# Patient Record
Sex: Male | Born: 1940 | Race: White | Hispanic: No | Marital: Married | Smoking: Former smoker
Health system: Southern US, Community
[De-identification: ages and names within clinical notes are randomized; demographics above are authoritative.]

## PROBLEM LIST (undated history)

## (undated) DIAGNOSIS — F17201 Nicotine dependence, unspecified, in remission: Secondary | ICD-10-CM

## (undated) DIAGNOSIS — I739 Peripheral vascular disease, unspecified: Secondary | ICD-10-CM

## (undated) DIAGNOSIS — I480 Paroxysmal atrial fibrillation: Secondary | ICD-10-CM

## (undated) DIAGNOSIS — D126 Benign neoplasm of colon, unspecified: Secondary | ICD-10-CM

## (undated) DIAGNOSIS — I209 Angina pectoris, unspecified: Secondary | ICD-10-CM

## (undated) DIAGNOSIS — E1121 Type 2 diabetes mellitus with diabetic nephropathy: Secondary | ICD-10-CM

## (undated) DIAGNOSIS — K2940 Chronic atrophic gastritis without bleeding: Secondary | ICD-10-CM

## (undated) DIAGNOSIS — H353190 Nonexudative age-related macular degeneration, unspecified eye, stage unspecified: Secondary | ICD-10-CM

## (undated) DIAGNOSIS — J929 Pleural plaque without asbestos: Secondary | ICD-10-CM

## (undated) DIAGNOSIS — K635 Polyp of colon: Secondary | ICD-10-CM

## (undated) DIAGNOSIS — H9193 Unspecified hearing loss, bilateral: Secondary | ICD-10-CM

## (undated) DIAGNOSIS — I10 Essential (primary) hypertension: Secondary | ICD-10-CM

## (undated) DIAGNOSIS — G3184 Mild cognitive impairment, so stated: Secondary | ICD-10-CM

## (undated) DIAGNOSIS — I2510 Atherosclerotic heart disease of native coronary artery without angina pectoris: Secondary | ICD-10-CM

## (undated) DIAGNOSIS — I313 Pericardial effusion (noninflammatory): Secondary | ICD-10-CM

## (undated) DIAGNOSIS — D509 Iron deficiency anemia, unspecified: Secondary | ICD-10-CM

## (undated) DIAGNOSIS — M1990 Unspecified osteoarthritis, unspecified site: Secondary | ICD-10-CM

## (undated) DIAGNOSIS — J189 Pneumonia, unspecified organism: Secondary | ICD-10-CM

## (undated) DIAGNOSIS — E782 Mixed hyperlipidemia: Secondary | ICD-10-CM

## (undated) DIAGNOSIS — I5042 Chronic combined systolic (congestive) and diastolic (congestive) heart failure: Secondary | ICD-10-CM

## (undated) DIAGNOSIS — N529 Male erectile dysfunction, unspecified: Secondary | ICD-10-CM

## (undated) DIAGNOSIS — E669 Obesity, unspecified: Secondary | ICD-10-CM

## (undated) DIAGNOSIS — I314 Cardiac tamponade: Secondary | ICD-10-CM

## (undated) DIAGNOSIS — F341 Dysthymic disorder: Secondary | ICD-10-CM

## (undated) DIAGNOSIS — J449 Chronic obstructive pulmonary disease, unspecified: Secondary | ICD-10-CM

## (undated) DIAGNOSIS — N183 Chronic kidney disease, stage 3 (moderate): Secondary | ICD-10-CM

## (undated) DIAGNOSIS — E119 Type 2 diabetes mellitus without complications: Secondary | ICD-10-CM

## (undated) DIAGNOSIS — K589 Irritable bowel syndrome without diarrhea: Secondary | ICD-10-CM

## (undated) DIAGNOSIS — I255 Ischemic cardiomyopathy: Secondary | ICD-10-CM

## (undated) HISTORY — DX: Benign neoplasm of colon, unspecified: D12.6

## (undated) HISTORY — DX: Chronic combined systolic (congestive) and diastolic (congestive) heart failure: I50.42

## (undated) HISTORY — DX: Pleural plaque without asbestos: J92.9

## (undated) HISTORY — DX: Nonexudative age-related macular degeneration, unspecified eye, stage unspecified: H35.3190

## (undated) HISTORY — DX: Irritable bowel syndrome, unspecified: K58.9

## (undated) HISTORY — DX: Ischemic cardiomyopathy: I25.5

## (undated) HISTORY — PX: TESTICLE SURGERY: SHX794

## (undated) HISTORY — PX: PATELLA FRACTURE SURGERY: SHX735

## (undated) HISTORY — DX: Type 2 diabetes mellitus without complications: E11.9

## (undated) HISTORY — DX: Nicotine dependence, unspecified, in remission: F17.201

## (undated) HISTORY — DX: Dysthymic disorder: F34.1

## (undated) HISTORY — DX: Mixed hyperlipidemia: E78.2

## (undated) HISTORY — DX: Paroxysmal atrial fibrillation: I48.0

## (undated) HISTORY — PX: CATARACT EXTRACTION W/ INTRAOCULAR LENS  IMPLANT, BILATERAL: SHX1307

## (undated) HISTORY — DX: Male erectile dysfunction, unspecified: N52.9

## (undated) HISTORY — DX: Polyp of colon: K63.5

## (undated) HISTORY — DX: Mild cognitive impairment of uncertain or unknown etiology: G31.84

## (undated) HISTORY — DX: Type 2 diabetes mellitus with diabetic nephropathy: E11.21

## (undated) HISTORY — DX: Iron deficiency anemia, unspecified: D50.9

## (undated) HISTORY — DX: Chronic obstructive pulmonary disease, unspecified: J44.9

## (undated) HISTORY — DX: Essential (primary) hypertension: I10

## (undated) HISTORY — PX: TRANSTHORACIC ECHOCARDIOGRAM: SHX275

## (undated) HISTORY — DX: Obesity, unspecified: E66.9

## (undated) HISTORY — DX: Chronic kidney disease, stage 3 (moderate): N18.3

## (undated) HISTORY — DX: Chronic atrophic gastritis without bleeding: K29.40

## (undated) HISTORY — DX: Peripheral vascular disease, unspecified: I73.9

## (undated) HISTORY — DX: Unspecified hearing loss, bilateral: H91.93

---
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
## 1997-01-05 HISTORY — PX: CHOLECYSTECTOMY OPEN: SUR202

## 1999-01-06 DIAGNOSIS — K635 Polyp of colon: Secondary | ICD-10-CM

## 1999-01-06 HISTORY — DX: Polyp of colon: K63.5

## 2002-02-17 ENCOUNTER — Encounter: Admission: RE | Admit: 2002-02-17 | Discharge: 2002-05-18 | Payer: Self-pay | Admitting: Unknown Physician Specialty

## 2002-07-27 ENCOUNTER — Encounter: Admission: RE | Admit: 2002-07-27 | Discharge: 2002-10-25 | Payer: Self-pay | Admitting: Unknown Physician Specialty

## 2006-04-08 ENCOUNTER — Ambulatory Visit (HOSPITAL_COMMUNITY): Admission: RE | Admit: 2006-04-08 | Discharge: 2006-04-08 | Payer: Self-pay | Admitting: Ophthalmology

## 2006-04-22 ENCOUNTER — Ambulatory Visit (HOSPITAL_COMMUNITY): Admission: RE | Admit: 2006-04-22 | Discharge: 2006-04-22 | Payer: Self-pay | Admitting: Ophthalmology

## 2007-03-14 ENCOUNTER — Emergency Department (HOSPITAL_COMMUNITY): Admission: EM | Admit: 2007-03-14 | Discharge: 2007-03-15 | Payer: Self-pay | Admitting: Emergency Medicine

## 2011-09-01 ENCOUNTER — Ambulatory Visit (INDEPENDENT_AMBULATORY_CARE_PROVIDER_SITE_OTHER): Payer: PRIVATE HEALTH INSURANCE | Admitting: Family Medicine

## 2011-09-01 ENCOUNTER — Encounter: Payer: Self-pay | Admitting: Family Medicine

## 2011-09-01 VITALS — BP 154/73 | HR 96 | Temp 97.0°F | Ht 71.0 in | Wt 202.0 lb

## 2011-09-01 DIAGNOSIS — IMO0001 Reserved for inherently not codable concepts without codable children: Secondary | ICD-10-CM

## 2011-09-01 DIAGNOSIS — I10 Essential (primary) hypertension: Secondary | ICD-10-CM

## 2011-09-01 DIAGNOSIS — Z0000 Encounter for general adult medical examination without abnormal findings: Secondary | ICD-10-CM

## 2011-09-01 DIAGNOSIS — R5383 Other fatigue: Secondary | ICD-10-CM

## 2011-09-01 DIAGNOSIS — D509 Iron deficiency anemia, unspecified: Secondary | ICD-10-CM

## 2011-09-01 DIAGNOSIS — Z125 Encounter for screening for malignant neoplasm of prostate: Secondary | ICD-10-CM

## 2011-09-01 DIAGNOSIS — R5381 Other malaise: Secondary | ICD-10-CM

## 2011-09-01 LAB — COMPREHENSIVE METABOLIC PANEL
ALT: 14 U/L (ref 0–53)
CO2: 25 mEq/L (ref 19–32)
Chloride: 102 mEq/L (ref 96–112)
GFR: 125.97 mL/min (ref >60.00)
Potassium: 4 mEq/L (ref 3.5–5.1)
Sodium: 136 mEq/L (ref 135–145)
Total Bilirubin: 0.7 mg/dL (ref 0.3–1.2)
Total Protein: 6.9 g/dL (ref 6.0–8.3)

## 2011-09-01 LAB — CBC WITH DIFFERENTIAL/PLATELET
Eosinophils Relative: 1 % (ref 0.0–5.0)
Lymphocytes Relative: 22.4 % (ref 12.0–46.0)
Monocytes Absolute: 0.8 10*3/uL (ref 0.1–1.0)
Monocytes Relative: 8.3 % (ref 3.0–12.0)
Neutrophils Relative %: 67.7 % (ref 43.0–77.0)
Platelets: 196 10*3/uL (ref 150.0–400.0)
WBC: 9.2 10*3/uL (ref 4.5–10.5)

## 2011-09-01 LAB — HEMOGLOBIN A1C: Hgb A1c MFr Bld: 8.3 % — ABNORMAL HIGH (ref 4.6–6.5)

## 2011-09-01 NOTE — Progress Notes (Signed)
Office Note 09/02/2011  CC:  Chief Complaint  Patient presents with  . Establish Care    transfer from Dr. Olen [name]    HPI:  Joshua [name] is a 71 y.o. White male who is here to establish care. Patient's most recent primary MD: Dr. Olen [name] at [facility] in [place].  Also saw Dr. Buddy Duty at [place] a couple of times but has been "released' from his care. Old records were reviewed prior to or during today's visit.  DM 2: Last HbA1c in records was 03/2011 and was 8.6%.  His urine microalb/cr was elevated at that time.  CMET and Lipids were fine at that time.  He checks his glucose fasting each morning and he estimates his avg at this time is 140s. Chromium and welchol were added 03/2011 but pt inadvertently has been taking inadequate dose of welchol (657m once daily)--after switching from powder to pills.  His complaint today is feeling fatigued/tired in general.  Usually wakes up feeling fine but gets tired in early afternoon when he gets home from work..  No focal weakness.  Says his sleep is ok, feels rested in AM, wife says he doesn't snore or have apneic periods.  He is due for a colonoscopy for screening purposes + f/u hx of hyperplastic polyp 2001--? Who did this colonoscopy.  Past Medical History  Diagnosis Date  . DM type 2 (diabetes mellitus, type 2)   . Hypertension   . Tobacco dependence     Prior failure to quit with wellbutrin  . CKD (chronic kidney disease), stage I   . Hyperlipidemia   . Obesity   . Hyperplastic colon polyp 2001  . COPD (chronic obstructive pulmonary disease)     Spirometry  2004 borderline obstruction  . Erectile dysfunction     Normal testosterone  . Diabetic nephropathy     Elevated urine microalb/cr 03/2011    Past Surgical History  Procedure Date  . Cholecystectomy open 1999  . Appendectomy 1957  . Hip surgery age 89   Right hip dislocation surgery/repair (no metal involved)  . Knee surgery 1979 or so    left; bolt + 3 screws to repair  tib plateau fx  . Testicle surgery as a child    Undescended testicle brought down into scrotum    History reviewed. No pertinent family history.  History   Social History  . Marital Status: Married    Spouse Name: Joshua [name]    Number of Children: 4  . Years of Education: N/A   Occupational History  . Not on file.   Social History Main Topics  . Smoking status: Current Everyday Smoker -- 1.0 packs/day for 60 years    Types: Cigarettes  . Smokeless tobacco: Current User  . Alcohol Use: No  . Drug Use: No  . Sexually Active: Not on file   Other Topics Concern  . Not on file   Social History Narrative   Married, 4 children.Formerly a cMedical illustratorLocal transportation bus driver.  Level of education: HS.  +tobacco--lifelong (still smoking as of 08/2011).  No alcohol or drugs.No exercise.  +excessive caffeine.    Outpatient Encounter Prescriptions as of 09/01/2011  Medication Sig Dispense Refill  . acetaminophen (TYLENOL) 500 MG tablet Take 1,000 mg by mouth 2 (two) times daily as needed.      . chlorthalidone (HYGROTON) 25 MG tablet Take 1 tablet by mouth daily.      . Chromium Picolinate 500 MCG TABS Take 1  tablet by mouth daily.      [name] glimepiride (AMARYL) 4 MG tablet Take 1 tablet by mouth 2 (two) times daily.      [name] lovastatin (MEVACOR) 40 MG tablet Take 1 tablet by mouth 2 (two) times daily.      . metFORMIN (GLUCOPHAGE) 1000 MG tablet Take 1 tablet AM and PM and 1/2 tablet at lunch      . Multiple Vitamin (MULTIVITAMIN) tablet Take 1 tablet by mouth daily.      . ONGLYZA 5 MG TABS tablet Take 1 tablet by mouth daily.      . pioglitazone (ACTOS) 45 MG tablet Take 1 tablet by mouth daily.      . ramipril (ALTACE) 10 MG capsule Take 1 tablet by mouth 2 (two) times daily.      Earnestine Mealing 625 MG tablet Take 1 tablet by mouth daily.        Allergies  Allergen Reactions  . Demerol (Meperidine)   . Morphine And Related   . Starlix (Nateglinide) Other (See  Comments)    gassy    ROS Review of Systems  Constitutional: Positive for fatigue. Negative for fever.  HENT: Negative for congestion and sore throat.   Eyes: Negative for visual disturbance.  Respiratory: Negative for cough.   Cardiovascular: Negative for chest pain.  Gastrointestinal: Negative for nausea and abdominal pain.  Genitourinary: Negative for dysuria.  Musculoskeletal: Negative for back pain and joint swelling.  Skin: Negative for rash.  Neurological: Negative for headaches. Weakness: see HPI.  Hematological: Negative for adenopathy.    PE; Blood pressure 154/73, pulse 96, temperature 97 F (36.1 C), temperature source Temporal, height _0  (1.803 m), weight 202 lb (91.627 kg), SpO2 97.00%. Gen: Alert, well appearing.  Patient is oriented to person, place, time, and situation. ZOX:WRUE: no injection, icteris, swelling, or exudate.  EOMI, PERRLA. Nose: no drainage or turbinate edema/swelling.  No injection or focal lesion.  Mouth: lips without lesion/swelling.  Oral mucosa pink and moist.  Oropharynx without erythema, exudate, or swelling.  Neck - No masses or thyromegaly or limitation in range of motion CV: RRR, no m/r/g.   LUNGS: CTA bilat, nonlabored resps, good aeration in all lung fields. EXT: 1+ bilat LE edema from just below knees down into ankles.   FEET: no deformity, no calluses, no skin breakdown or erythema.  Great toenails on each foot are thickened moderately.  Other nails are normal.  Sensation intact to monofilament testing in all areas bilat.  Pertinent labs:  None today  ASSESSMENT AND PLAN:   New Patient: old records from PMD and endo reviewed today.  Fatigue Reassured.  Most likely related to mild deconditioning. However, will check TSH, CBC, and CMET. Recheck in 2 wks for CPE.  HTN (hypertension), benign BP a little up here.  Will hold off on any change in regimen for now.  Type II or unspecified type diabetes mellitus without mention of  complication, uncontrolled On maximal oral management, but actually needs to take full dose of welchol for a few months before saying oral med therapy not effective enough.  Discussed correct dosing of his 646m welchol tabs today: 3 tabs bid or 6 tabs qd.  He'll start this dosing now. Check HbA1c today.  He is UTD on urine microalb/cr and eye exam.  Foot exam normal today.   Prostate cancer screening PSA drawn today. Will do DRE at [facility] in 1-2 wks.    Return in about 2 weeks (around 09/15/2011)  for CPE (does not need to be fasting).

## 2011-09-02 ENCOUNTER — Encounter: Payer: Self-pay | Admitting: Family Medicine

## 2011-09-02 DIAGNOSIS — Z125 Encounter for screening for malignant neoplasm of prostate: Secondary | ICD-10-CM | POA: Insufficient documentation

## 2011-09-02 DIAGNOSIS — D62 Acute posthemorrhagic anemia: Secondary | ICD-10-CM | POA: Insufficient documentation

## 2011-09-02 DIAGNOSIS — R5383 Other fatigue: Secondary | ICD-10-CM | POA: Insufficient documentation

## 2011-09-02 DIAGNOSIS — I10 Essential (primary) hypertension: Secondary | ICD-10-CM | POA: Insufficient documentation

## 2011-09-02 LAB — IBC PANEL
Iron: 34 ug/dL — ABNORMAL LOW (ref 42–165)
Saturation Ratios: 6.6 % — ABNORMAL LOW (ref 20.0–50.0)
Transferrin: 368.4 mg/dL — ABNORMAL HIGH (ref 212.0–360.0)

## 2011-09-02 NOTE — Addendum Note (Signed)
Addended by: Darlina Sicilian R on: 09/02/2011 11:36 AM   Modules accepted: Orders

## 2011-09-02 NOTE — Assessment & Plan Note (Signed)
BP a little up here.  Will hold off on any change in regimen for now.

## 2011-09-02 NOTE — Assessment & Plan Note (Signed)
On maximal oral management, but actually needs to take full dose of welchol for a few months before saying oral med therapy not effective enough.  Discussed correct dosing of his 640m welchol tabs today: 3 tabs bid or 6 tabs qd.  He'll start this dosing now. Check HbA1c today.  He is UTD on urine microalb/cr and eye exam.  Foot exam normal today.

## 2011-09-02 NOTE — Assessment & Plan Note (Signed)
PSA drawn today. Will do DRE at C[facility] in 1-2 wks.

## 2011-09-02 NOTE — Assessment & Plan Note (Signed)
Reassured.  Most likely related to mild deconditioning. However, will check TSH, CBC, and CMET. Recheck in 2 wks for CPE.

## 2011-09-03 ENCOUNTER — Other Ambulatory Visit: Payer: Self-pay | Admitting: Family Medicine

## 2011-09-03 DIAGNOSIS — D509 Iron deficiency anemia, unspecified: Secondary | ICD-10-CM

## 2011-09-03 NOTE — Progress Notes (Signed)
Quick Note:  Patient Informed and voiced understanding. Per MD pt needs to pick up an over the counter Iron Sulfate 325 mg and take bid. Pt would also like to proceed with the GI referral. Please advise? ______

## 2011-09-03 NOTE — Progress Notes (Signed)
Referral to [facility] ordered today.--PM

## 2011-09-09 NOTE — Progress Notes (Signed)
Noted-PM

## 2011-09-10 ENCOUNTER — Other Ambulatory Visit: Payer: Self-pay | Admitting: Family Medicine

## 2011-09-10 MED ORDER — COLESEVELAM HCL 625 MG PO TABS: 1875.0000 mg | ORAL_TABLET | Freq: Two times a day (BID) | ORAL | Status: DC

## 2011-09-10 NOTE — Telephone Encounter (Signed)
Pt was seen 8/27.  Correct dosing discussed with patient by Dr. Anitra Lauth- 3 bid or 6 QD.  This is called to pharmacy.

## 2011-09-15 ENCOUNTER — Other Ambulatory Visit: Payer: PRIVATE HEALTH INSURANCE

## 2011-09-15 ENCOUNTER — Encounter: Payer: Self-pay | Admitting: Gastroenterology

## 2011-09-15 ENCOUNTER — Other Ambulatory Visit: Payer: Self-pay | Admitting: *Deleted

## 2011-09-15 LAB — HEMOCCULT SLIDES (X 3 CARDS)

## 2011-09-15 MED ORDER — PIOGLITAZONE HCL 45 MG PO TABS: 45.0000 mg | ORAL_TABLET | Freq: Every day | ORAL | Status: DC

## 2011-09-15 MED ORDER — LOVASTATIN 40 MG PO TABS: 40.0000 mg | ORAL_TABLET | Freq: Two times a day (BID) | ORAL | Status: DC

## 2011-09-15 MED ORDER — METFORMIN HCL 1000 MG PO TABS: ORAL_TABLET | ORAL | Status: DC

## 2011-09-15 MED ORDER — GLIMEPIRIDE 4 MG PO TABS: 4.0000 mg | ORAL_TABLET | Freq: Two times a day (BID) | ORAL | Status: DC

## 2011-09-15 MED ORDER — CHLORTHALIDONE 25 MG PO TABS: 25.0000 mg | ORAL_TABLET | Freq: Every day | ORAL | Status: DC

## 2011-09-15 MED ORDER — RAMIPRIL 10 MG PO CAPS: 10.0000 mg | ORAL_CAPSULE | Freq: Two times a day (BID) | ORAL | Status: DC

## 2011-09-15 NOTE — Telephone Encounter (Signed)
Faxed refill request received from pharmacy for METFORMIN, GLIMEPERIDE, LOVASTATIN, RAMIPRIL, PIOGLITAZONE, CHLORTHALIDONE,  Last filled by MD on--never by this MD Last seen on 09/01/11 Follow up on 09/16/11 for [facility] RX's sent.

## 2011-09-16 ENCOUNTER — Ambulatory Visit (INDEPENDENT_AMBULATORY_CARE_PROVIDER_SITE_OTHER): Payer: PRIVATE HEALTH INSURANCE | Admitting: Family Medicine

## 2011-09-16 ENCOUNTER — Encounter: Payer: Self-pay | Admitting: Family Medicine

## 2011-09-16 VITALS — BP 123/66 | HR 96 | Ht 71.0 in | Wt 204.0 lb

## 2011-09-16 DIAGNOSIS — Z23 Encounter for immunization: Secondary | ICD-10-CM

## 2011-09-16 DIAGNOSIS — Z0000 Encounter for general adult medical examination without abnormal findings: Secondary | ICD-10-CM

## 2011-09-16 DIAGNOSIS — D509 Iron deficiency anemia, unspecified: Secondary | ICD-10-CM

## 2011-09-16 MED ORDER — SAXAGLIPTIN HCL 5 MG PO TABS: 5.0000 mg | ORAL_TABLET | Freq: Every day | ORAL | Status: DC

## 2011-09-16 NOTE — Patient Instructions (Signed)
Health Maintenance, Males A healthy lifestyle and preventative care can promote health and wellness.  Maintain regular health, dental, and eye exams.   Eat a healthy diet. Foods like vegetables, fruits, whole grains, low-fat dairy products, and lean protein foods contain the nutrients you need without too many calories. Decrease your intake of foods high in solid fats, added sugars, and salt. Get information about a proper diet from your caregiver, if necessary.   Regular physical exercise is one of the most important things you can do for your health. Most adults should get at least 150 minutes of moderate-intensity exercise (any activity that increases your heart rate and causes you to sweat) each week. In addition, most adults need muscle-strengthening exercises on 2 or more days a week.    Maintain a healthy weight. The body mass index (BMI) is a screening tool to identify possible weight problems. It provides an estimate of body fat based on height and weight. Your caregiver can help determine your BMI, and can help you achieve or maintain a healthy weight. For adults 20 years and older:   A BMI below 18.5 is considered underweight.   A BMI of 18.5 to 24.9 is normal.   A BMI of 25 to 29.9 is considered overweight.   A BMI of 30 and above is considered obese.   Maintain normal blood lipids and cholesterol by exercising and minimizing your intake of saturated fat. Eat a balanced diet with plenty of fruits and vegetables. Blood tests for lipids and cholesterol should begin at age 20 and be repeated every 5 years. If your lipid or cholesterol levels are high, you are over 50, or you are a high risk for heart disease, you may need your cholesterol levels checked more frequently.Ongoing high lipid and cholesterol levels should be treated with medicines, if diet and exercise are not effective.   If you smoke, find out from your caregiver how to quit. If you do not use tobacco, do not start.    If you choose to drink alcohol, do not exceed 2 drinks per day. One drink is considered to be 12 ounces (355 mL) of beer, 5 ounces (148 mL) of wine, or 1.5 ounces (44 mL) of liquor.   Avoid use of street drugs. Do not share needles with anyone. Ask for help if you need support or instructions about stopping the use of drugs.   High blood pressure causes heart disease and increases the risk of stroke. Blood pressure should be checked at least every 1 to 2 years. Ongoing high blood pressure should be treated with medicines if weight loss and exercise are not effective.   If you are 45 to 72 years old, ask your caregiver if you should take aspirin to prevent heart disease.   Diabetes screening involves taking a blood sample to check your fasting blood sugar level. This should be done once every 3 years, after age 45, if you are within normal weight and without risk factors for diabetes. Testing should be considered at a younger age or be carried out more frequently if you are overweight and have at least 1 risk factor for diabetes.   Colorectal cancer can be detected and often prevented. Most routine colorectal cancer screening begins at the age of 50 and continues through age 75. However, your caregiver may recommend screening at an earlier age if you have risk factors for colon cancer. On a yearly basis, your caregiver may provide home test kits to check for hidden   blood in the stool. Use of a small camera at the end of a tube, to directly examine the colon (sigmoidoscopy or colonoscopy), can detect the earliest forms of colorectal cancer. Talk to your caregiver about this at age 90, when routine screening begins. Direct examination of the colon should be repeated every 5 to 10 years through age 2, unless early forms of pre-cancerous polyps or small growths are found.   Hepatitis C blood testing is recommended for all people born from [birth year] through 1966 and any individual with known risks for  hepatitis C.   Healthy men should no longer receive prostate-specific antigen (PSA) blood tests as part of routine cancer screening. Consult with your caregiver about prostate cancer screening.   Testicular cancer screening is not recommended for adolescents or adult males who have no symptoms. Screening includes self-exam, caregiver exam, and other screening tests. Consult with your caregiver about any symptoms you have or any concerns you have about testicular cancer.   Practice safe sex. Use condoms and avoid high-risk sexual practices to reduce the spread of sexually transmitted infections (STIs).   Use sunscreen with a sun protection factor (SPF) of 30 or greater. Apply sunscreen liberally and repeatedly throughout the day. You should seek shade when your shadow is shorter than you. Protect yourself by wearing long sleeves, pants, a wide-brimmed hat, and sunglasses year round, whenever you are outdoors.   Notify your caregiver of new moles or changes in moles, especially if there is a change in shape or color. Also notify your caregiver if a mole is larger than the size of a pencil eraser.   A one-time screening for abdominal aortic aneurysm (AAA) and surgical repair of large AAAs by sound wave imaging (ultrasonography) is recommended for ages 15 to 25 years who are current or former smokers.   Stay current with your immunizations.  Document Released: 06/20/2007 Document Revised: 12/11/2010 Document Reviewed: 05/19/2010 [facility] Patient Information 2012 [place].

## 2011-09-16 NOTE — Progress Notes (Signed)
Office Note 09/16/2011  CC:  Chief Complaint  Patient presents with  . Annual Exam    no problems    HPI:  Joshua [name] is a 71 y.o. White male who is here for CPE. I saw him 2 wks ago and he was complaining of generalized fatigue and his labs turned up microcytic anemia, f/u iron testing confirmed iron deficiency.  He was told to start FeSO4 368m bid and was given hemoccult cards x 3 but results returned yesterday showing that the sample was done incorrectly and could not be processed.. Tolerating iron well, +compliant. Glucoses avg around 100 since last visit--taking full dose of welchol.   He has GI initial consult 10/08/11.  Past Medical History  Diagnosis Date  . DM type 2 (diabetes mellitus, type 2)   . Hypertension   . Tobacco dependence     Prior failure to quit with wellbutrin  . CKD (chronic kidney disease), stage I   . Hyperlipidemia   . Obesity   . Hyperplastic colon polyp 2001  . COPD (chronic obstructive pulmonary disease)     Spirometry  2004 borderline obstruction  . Erectile dysfunction     Normal testosterone  . Diabetic nephropathy     Elevated urine microalb/cr 03/2011    Past Surgical History  Procedure Date  . Cholecystectomy open 1999  . Appendectomy 1957  . Hip surgery age 71   Right hip dislocation surgery/repair (no metal involved)  . Knee surgery 1979 or so    left; bolt + 3 screws to repair tib plateau fx  . Testicle surgery as a child    Undescended testicle brought down into scrotum    No family history on file.  History   Social History  . Marital Status: Married    Spouse Name: [name]    Number of Children: 4  . Years of Education: N/A   Occupational History  . Not on file.   Social History Main Topics  . Smoking status: Current Every Day Smoker -- 1.0 packs/day for 60 years    Types: Cigarettes  . Smokeless tobacco: Current User  . Alcohol Use: No  . Drug Use: No  . Sexually Active: Not on file   Other Topics  Concern  . Not on file   Social History Narrative   Married, 4 children.Formerly a cMedical illustratorLocal transportation bus driver.  Level of education: HS.  +tobacco--lifelong (still smoking as of 08/2011).  No alcohol or drugs.No exercise.  +excessive caffeine.    Outpatient Prescriptions Prior to Visit  Medication Sig Dispense Refill  . acetaminophen (TYLENOL) 500 MG tablet Take 1,000 mg by mouth 2 (two) times daily as needed.      . chlorthalidone (HYGROTON) 25 MG tablet Take 1 tablet (25 mg total) by mouth daily.  30 tablet  5  . Chromium Picolinate 500 MCG TABS Take 1 tablet by mouth daily.      . colesevelam (WELCHOL) 625 MG tablet Take 3 tablets (1,875 mg total) by mouth 2 (two) times daily with a meal.  180 tablet  5  . glimepiride (AMARYL) 4 MG tablet Take 1 tablet (4 mg total) by mouth 2 (two) times daily.  60 tablet  5  . lovastatin (MEVACOR) 40 MG tablet Take 1 tablet (40 mg total) by mouth 2 (two) times daily.  60 tablet  5  . metFORMIN (GLUCOPHAGE) 1000 MG tablet Take 1 tablet AM and PM and 1/2 tablet at lunch  75 tablet  5  . Multiple Vitamin (MULTIVITAMIN) tablet Take 1 tablet by mouth daily.      . pioglitazone (ACTOS) 45 MG tablet Take 1 tablet (45 mg total) by mouth daily.  30 tablet  5  . ramipril (ALTACE) 10 MG capsule Take 1 capsule (10 mg total) by mouth 2 (two) times daily.  60 capsule  5  . ONGLYZA 5 MG TABS tablet Take 1 tablet by mouth daily.        Allergies  Allergen Reactions  . Demerol (Meperidine)   . Morphine And Related   . Starlix (Nateglinide) Other (See Comments)    gassy    ROS Review of Systems  Constitutional: Positive for fatigue. Negative for fever, chills and appetite change.  HENT: Negative for ear pain, congestion, sore throat, neck stiffness and dental problem.   Eyes: Negative for discharge, redness and visual disturbance.  Respiratory: Negative for cough, chest tightness, shortness of breath and wheezing.     Cardiovascular: Negative for chest pain, palpitations and leg swelling.  Gastrointestinal: Negative for nausea, vomiting, abdominal pain, diarrhea and blood in stool.  Genitourinary: Negative for dysuria, urgency, frequency, hematuria, flank pain and difficulty urinating.  Musculoskeletal: Negative for myalgias, back pain, joint swelling and arthralgias.  Skin: Negative for pallor and rash.  Neurological: Negative for dizziness, speech difficulty, weakness and headaches.  Hematological: Negative for adenopathy. Does not bruise/bleed easily.  Psychiatric/Behavioral: Negative for confusion and disturbed wake/sleep cycle. The patient is not nervous/anxious.     PE; Blood pressure 123/66, pulse 96, height _0  (1.803 m), weight 204 lb (92.534 kg). Gen: Alert, well appearing.  Patient is oriented to person, place, time, and situation. ENT: Ears: EACs clear, normal epithelium.  TMs with good light reflex and landmarks bilaterally.  Eyes: no injection, icteris, swelling, or exudate.  EOMI, PERRLA. Nose: no drainage or turbinate edema/swelling.  No injection or focal lesion.  Mouth: lips without lesion/swelling.  Oral mucosa pink and moist.  Dentition intact and without obvious caries or gingival swelling.  Oropharynx without erythema, exudate, or swelling.  Neck: supple/nontender.  No LAD, mass, or TM.  Carotid pulses 2+ bilaterally, without bruits. CV: RRR, no m/r/g.   LUNGS: CTA bilat, nonlabored resps, good aeration in all lung fields. ABD: soft, NT, ND, BS normal.  No hepatospenomegaly or mass.  No bruits. EXT: no clubbing, cyanosis, or edema.  Skin - no sores or suspicious lesions or rashes or color changes Genitals normal; both testes normal to palpation except left testicle is notably atrophied; without tenderness, masses, hydroceles, varicoceles, erythema or swelling. Shaft normal, circumcised, meatus normal without discharge. No inguinal hernia noted. No inguinal lymphadenopathy. Rectal  exam: negative without mass, lesions or tenderness, PROSTATE EXAM: smooth and symmetric without nodules or tenderness.  Size wnl.   Pertinent labs:  Hemoccult of stool wipings from glove today was negative  No results found for this basename: TSH   Lab Results  Component Value Date   WBC 9.2 09/01/2011   HGB 10.0* 09/01/2011   HCT 33.3* 09/01/2011   MCV 65.3 Repeated and verified X2.* 09/01/2011   PLT 196.0 09/01/2011   Lab Results  Component Value Date   CREATININE 0.7 09/01/2011   BUN 21 09/01/2011   NA 136 09/01/2011   K 4.0 09/01/2011   CL 102 09/01/2011   CO2 25 09/01/2011   Lab Results  Component Value Date   ALT 14 09/01/2011   AST 17 09/01/2011   ALKPHOS 53 09/01/2011   BILITOT 0.7 09/01/2011  No results found for this basename: CHOL   No results found for this basename: HDL   No results found for this basename: LDLCALC   No results found for this basename: TRIG   No results found for this basename: CHOLHDL   Lab Results  Component Value Date   PSA 1.33 09/01/2011       ASSESSMENT AND PLAN:   Health maintenance examination Reviewed age and gender appropriate health maintenance issues (prudent diet, regular exercise, health risks of tobacco and excessive alcohol, use of seatbelts, fire alarms in home, use of sunscreen).  Also reviewed age and gender appropriate health screening as well as vaccine recommendations. Encouraged smoking cessation but he is not contemplating quitting at this time. Flu vaccine IM today.  Iron deficiency anemia Needs to repeat hemoccults, continue iron bid, keep GI appointment for 10/08/11.    FOLLOW UP:  Return in about 3 months (around 12/16/2011) for f/u anemia, DM 2, HTN.

## 2011-09-16 NOTE — Assessment & Plan Note (Signed)
Reviewed age and gender appropriate health maintenance issues (prudent diet, regular exercise, health risks of tobacco and excessive alcohol, use of seatbelts, fire alarms in home, use of sunscreen).  Also reviewed age and gender appropriate health screening as well as vaccine recommendations. Encouraged smoking cessation but he is not contemplating quitting at this time. Flu vaccine IM today.

## 2011-09-16 NOTE — Assessment & Plan Note (Signed)
Needs to repeat hemoccults, continue iron bid, keep GI appointment for 10/08/11.

## 2011-10-01 ENCOUNTER — Encounter: Payer: Self-pay | Admitting: Family Medicine

## 2011-10-08 ENCOUNTER — Ambulatory Visit (INDEPENDENT_AMBULATORY_CARE_PROVIDER_SITE_OTHER): Payer: PRIVATE HEALTH INSURANCE | Admitting: Gastroenterology

## 2011-10-08 ENCOUNTER — Encounter: Payer: Self-pay | Admitting: Gastroenterology

## 2011-10-08 VITALS — BP 128/40 | HR 92 | Ht 70.0 in | Wt 201.0 lb

## 2011-10-08 DIAGNOSIS — Z860100 Personal history of colon polyps, unspecified: Secondary | ICD-10-CM | POA: Insufficient documentation

## 2011-10-08 DIAGNOSIS — D509 Iron deficiency anemia, unspecified: Secondary | ICD-10-CM

## 2011-10-08 NOTE — Progress Notes (Signed)
History of Present Illness: Pleasant 71 year old white male with history of colon polyps, hypertension, diabetes and COPD referred at the request of Dr. Anitra Lauth for evaluation of anemia.  This was noted on routine testing,  Hemoglobin was 10 and MCV 64. The patient's main complaint is easy fatigability. He's had no overt GI bleeding including melena or hematochezia. He denies abdominal pain pyrosis or dysphagia. He apparantly has a history of colon polyps and last colonoscoped over 10 years ago.  He tends to be constipated.    Past Medical History  Diagnosis Date  . DM type 2 (diabetes mellitus, type 2)   . Hypertension   . Tobacco dependence     Prior failure to quit with wellbutrin  . CKD (chronic kidney disease), stage I   . Hyperlipidemia   . Obesity   . Hyperplastic colon polyp 2001  . COPD (chronic obstructive pulmonary disease)     Spirometry  2004 borderline obstruction  . Erectile dysfunction     Normal testosterone  . Diabetic nephropathy     Elevated urine microalb/cr 03/2011   Past Surgical History  Procedure Date  . Cholecystectomy open 1999  . Appendectomy 1957  . Hip surgery age 37    Right hip dislocation surgery/repair (no metal involved)  . Knee surgery 1979 or so    left; bolt + 3 screws to repair tib plateau fx  . Testicle surgery as a child    Undescended testicle brought down into scrotum  . Tonsillectomy    family history includes Breast cancer in his sister; Diabetes in his maternal uncle and paternal grandmother; and Heart disease in his father and maternal uncle. Current Outpatient Prescriptions  Medication Sig Dispense Refill  . acetaminophen (TYLENOL) 500 MG tablet Take 1,000 mg by mouth 2 (two) times daily as needed.      [name] aspirin 81 MG tablet Take 81 mg by mouth daily.      . chlorthalidone (HYGROTON) 25 MG tablet Take 1 tablet (25 mg total) by mouth daily.  30 tablet  5  . Chromium Picolinate 500 MCG TABS Take 1 tablet by mouth daily.      .  colesevelam (WELCHOL) 625 MG tablet Take 3 tablets (1,875 mg total) by mouth 2 (two) times daily with a meal.  180 tablet  5  . ferrous sulfate 325 (65 FE) MG tablet Take 325 mg by mouth 2 (two) times daily.      [name] glimepiride (AMARYL) 4 MG tablet Take 1 tablet (4 mg total) by mouth 2 (two) times daily.  60 tablet  5  . lovastatin (MEVACOR) 40 MG tablet Take 1 tablet (40 mg total) by mouth 2 (two) times daily.  60 tablet  5  . metFORMIN (GLUCOPHAGE) 1000 MG tablet Take 1 tablet AM and PM and 1/2 tablet at lunch  75 tablet  5  . Multiple Vitamin (MULTIVITAMIN) tablet Take 1 tablet by mouth daily.      . pioglitazone (ACTOS) 45 MG tablet Take 1 tablet (45 mg total) by mouth daily.  30 tablet  5  . ramipril (ALTACE) 10 MG capsule Take 1 capsule (10 mg total) by mouth 2 (two) times daily.  60 capsule  5  . saxagliptin HCl (ONGLYZA) 5 MG TABS tablet Take 1 tablet (5 mg total) by mouth daily.  30 tablet  5   Allergies as of 10/08/2011 - Review Complete 10/08/2011  Allergen Reaction Noted  . Demerol (meperidine)  09/01/2011  . Morphine and related  09/01/2011  . Starlix (nateglinide) Other (See Comments) 09/01/2011    reports that he has been smoking Cigarettes.  He has a 60 pack-year smoking history. His smokeless tobacco use includes Chew. He reports that he does not drink alcohol or use illicit drugs.     Review of Systems: Pertinent positive and negative review of systems were noted in the above HPI section. All other review of systems were otherwise negative.  Vital signs were reviewed in today's medical record Physical Exam: General: Well developed , well nourished, no acute distress Head: Normocephalic and atraumatic Eyes:  sclerae anicteric, EOMI Ears: Normal auditory acuity Mouth: No deformity or lesions Neck: Supple, no masses or thyromegaly Lungs: Clear throughout to auscultation Heart: Regular rate and rhythm; no murmurs, rubs or bruits Abdomen: Soft, non tender and non  distended. No masses, hepatosplenomegaly or hernias noted. Normal Bowel sounds Rectal:deferred Musculoskeletal: Symmetrical with no gross deformities  Skin: No lesions on visible extremities Pulses:  Normal pulses noted Extremities: No clubbing, cyanosis, edema or deformities noted Neurological: Alert oriented x 4, grossly nonfocal Cervical Nodes:  No significant cervical adenopathy Inguinal Nodes: No significant inguinal adenopathy Psychological:  Alert and cooperative. Normal mood and affect

## 2011-10-08 NOTE — Assessment & Plan Note (Signed)
Plan colonoscopy

## 2011-10-08 NOTE — Assessment & Plan Note (Signed)
Iron deficiency anemia presumably due to chronic GI blood loss. Plan colonoscopy in view of iron deficiency and history of colon polyps. Should this test be negative I will proceed with upper endoscopy. Patient also needs Hemoccults.

## 2011-10-08 NOTE — Patient Instructions (Addendum)
Colonoscopy A colonoscopy is an exam to evaluate your entire colon. In this exam, your colon is cleansed. A long fiberoptic tube is inserted through your rectum and into your colon. The fiberoptic scope (endoscope) is a long bundle of enclosed and very flexible fibers. These fibers transmit light to the area examined and send images from that area to your caregiver. Discomfort is usually minimal. You may be given a drug to help you sleep (sedative) during or prior to the procedure. This exam helps to detect lumps (tumors), polyps, inflammation, and areas of bleeding. Your caregiver may also take a small piece of tissue (biopsy) that will be examined under a microscope. LET YOUR CAREGIVER KNOW ABOUT:   Allergies to food or medicine.  Medicines taken, including vitamins, herbs, eyedrops, over-the-counter medicines, and creams.  Use of steroids (by mouth or creams).  Previous problems with anesthetics or numbing medicines.  History of bleeding problems or blood clots.  Previous surgery.  Other health problems, including diabetes and kidney problems.  Possibility of pregnancy, if this applies. BEFORE THE PROCEDURE   A clear liquid diet may be required for 2 days before the exam.  Ask your caregiver about changing or stopping your regular medications.  Liquid injections (enemas) or laxatives may be required.  A large amount of electrolyte solution may be given to you to drink over a short period of time. This solution is used to clean out your colon.  You should be present 60 minutes prior to your procedure or as directed by your caregiver. AFTER THE PROCEDURE   If you received a sedative or pain relieving medication, you will need to arrange for someone to drive you home.  Occasionally, there is a little blood passed with the first bowel movement. Do not be concerned. FINDING OUT THE RESULTS OF YOUR TEST Not all test results are available during your visit. If your test results are  not back during the visit, make an appointment with your caregiver to find out the results. Do not assume everything is normal if you have not heard from your caregiver or the medical facility. It is important for you to follow up on all of your test results. HOME CARE INSTRUCTIONS   It is not unusual to pass moderate amounts of gas and experience mild abdominal cramping following the procedure. This is due to air being used to inflate your colon during the exam. Walking or a warm pack on your belly (abdomen) may help.  You may resume all normal meals and activities after sedatives and medicines have worn off.  Only take over-the-counter or prescription medicines for pain, discomfort, or fever as directed by your caregiver. Do not use aspirin or blood thinners if a biopsy was taken. Consult your caregiver for medicine usage if biopsies were taken. SEEK IMMEDIATE MEDICAL CARE IF:   You have a fever.  You pass large blood clots or fill a toilet with blood following the procedure. This may also occur 10 to 14 days following the procedure. This is more likely if a biopsy was taken.  You develop abdominal pain that keeps getting worse and cannot be relieved with medicine. Document Released: 12/20/1999 Document Revised: 03/16/2011 Document Reviewed: 08/04/2007 [facility] Patient Information 2013 [place].

## 2011-10-16 ENCOUNTER — Ambulatory Visit (HOSPITAL_COMMUNITY)
Admission: RE | Admit: 2011-10-16 | Discharge: 2011-10-16 | Disposition: A | Discharge status: Home / Self Care | Payer: PRIVATE HEALTH INSURANCE | Source: Ambulatory Visit | Attending: Gastroenterology | Admitting: Gastroenterology

## 2011-10-16 ENCOUNTER — Encounter (HOSPITAL_COMMUNITY): Payer: Self-pay | Admitting: *Deleted

## 2011-10-16 ENCOUNTER — Encounter (HOSPITAL_COMMUNITY)
Admission: RE | Disposition: A | Discharge status: Home / Self Care | Payer: Self-pay | Source: Ambulatory Visit | Attending: Gastroenterology

## 2011-10-16 DIAGNOSIS — J4489 Other specified chronic obstructive pulmonary disease: Secondary | ICD-10-CM | POA: Insufficient documentation

## 2011-10-16 DIAGNOSIS — E785 Hyperlipidemia, unspecified: Secondary | ICD-10-CM | POA: Insufficient documentation

## 2011-10-16 DIAGNOSIS — D509 Iron deficiency anemia, unspecified: Secondary | ICD-10-CM

## 2011-10-16 DIAGNOSIS — J449 Chronic obstructive pulmonary disease, unspecified: Secondary | ICD-10-CM | POA: Insufficient documentation

## 2011-10-16 DIAGNOSIS — D375 Neoplasm of uncertain behavior of rectum: Secondary | ICD-10-CM | POA: Insufficient documentation

## 2011-10-16 DIAGNOSIS — D126 Benign neoplasm of colon, unspecified: Secondary | ICD-10-CM

## 2011-10-16 DIAGNOSIS — I129 Hypertensive chronic kidney disease with stage 1 through stage 4 chronic kidney disease, or unspecified chronic kidney disease: Secondary | ICD-10-CM | POA: Insufficient documentation

## 2011-10-16 DIAGNOSIS — D371 Neoplasm of uncertain behavior of stomach: Secondary | ICD-10-CM | POA: Insufficient documentation

## 2011-10-16 DIAGNOSIS — N189 Chronic kidney disease, unspecified: Secondary | ICD-10-CM | POA: Insufficient documentation

## 2011-10-16 DIAGNOSIS — Z86010 Personal history of colonic polyps: Secondary | ICD-10-CM

## 2011-10-16 DIAGNOSIS — E119 Type 2 diabetes mellitus without complications: Secondary | ICD-10-CM | POA: Insufficient documentation

## 2011-10-16 DIAGNOSIS — Z79899 Other long term (current) drug therapy: Secondary | ICD-10-CM | POA: Insufficient documentation

## 2011-10-16 HISTORY — PX: COLONOSCOPY: SHX5424

## 2011-10-16 HISTORY — DX: Benign neoplasm of colon, unspecified: D12.6

## 2011-10-16 HISTORY — DX: Unspecified osteoarthritis, unspecified site: M19.90

## 2011-10-16 SURGERY — COLONOSCOPY
Anesthesia: Moderate Sedation

## 2011-10-16 MED ORDER — MIDAZOLAM HCL 5 MG/5ML IJ SOLN
INTRAMUSCULAR | Status: DC | PRN
  Administered 2011-10-16: 2 mg via INTRAVENOUS
  Administered 2011-10-16 (×2): 1 mg via INTRAVENOUS
  Administered 2011-10-16: 2 mg via INTRAVENOUS
  Administered 2011-10-16: 1 mg via INTRAVENOUS

## 2011-10-16 MED ORDER — FENTANYL CITRATE 0.05 MG/ML IJ SOLN
INTRAMUSCULAR | Status: DC | PRN
  Administered 2011-10-16 (×2): 12.5 ug via INTRAVENOUS
  Administered 2011-10-16 (×2): 25 ug via INTRAVENOUS

## 2011-10-16 MED ORDER — MIDAZOLAM HCL 10 MG/2ML IJ SOLN
INTRAMUSCULAR | Status: AC
  Filled 2011-10-16: qty 4

## 2011-10-16 MED ORDER — SODIUM CHLORIDE 0.9 % IV SOLN: INTRAVENOUS | Status: DC

## 2011-10-16 MED ORDER — FENTANYL CITRATE 0.05 MG/ML IJ SOLN
INTRAMUSCULAR | Status: AC
  Filled 2011-10-16: qty 4

## 2011-10-16 NOTE — Op Note (Signed)
[facility] [place], [address]   COLONOSCOPY PROCEDURE REPORT  PATIENT: Joshua [name], Joshua [name]  MR#: [id number] BIRTHDATE: 1940-06-29 , 71  yrs. old GENDER: Male ENDOSCOPIST: Inda Castle, MD REFERRED GM:[id number] Anitra Lauth, M.D. PROCEDURE DATE:  10/16/2011 PROCEDURE:   Colonoscopy with snare polypectomy ASA CLASS:   Class II INDICATIONS:iron deficiency anemia. MEDICATIONS: These medications were titrated to patient response per physician's verbal order, Versed 7 mg IV, and Fentanyl 75 mcg IV  DESCRIPTION OF PROCEDURE:   After the risks benefits and alternatives of the procedure were thoroughly explained, informed consent was obtained.  A digital rectal exam revealed no abnormalities of the rectum.   The Pentax Colonoscope [id number] endoscope was introduced through the anus and advanced to the cecum, which was identified by both the appendix and ileocecal valve. No adverse events experienced.   The quality of the prep was Suprep excellent  The instrument was then slowly withdrawn as the colon was fully examined.      COLON FINDINGS: A sessile polyp measuring 2-3 mm in size was found in the transverse colon.  A polypectomy was performed with a cold snare.  The resection was complete and the polyp tissue was completely retrieved.   A sessile polyp measuring 4 mm in size was found.  A polypectomy was performed with a cold snare.  The resection was complete and the polyp tissue was completely retrieved.  Retroflexed views revealed no abnormalities. The time to cecum=  .  Withdrawal time=10 minutes 0 seconds.  The scope was withdrawn and the procedure completed. COMPLICATIONS: There were no complications.  ENDOSCOPIC IMPRESSION: 1.   Sessile polyp measuring 2-3 mm in size was found in the transverse colon; polypectomy was performed with a cold snare 2.   Sessile polyp measuring 4 mm in size was found; polypectomy was performed with a cold  snare  RECOMMENDATIONS: Upper endoscopy will be scheduled   eSigned:  Inda Castle, MD 10/16/2011 9:43 AM   cc:

## 2011-10-16 NOTE — Interval H&P Note (Signed)
History and Physical Interval Note:  10/16/2011 8:54 AM  Joshua [name]  has presented today for surgery, with the diagnosis of anemia  The various methods of treatment have been discussed with the patient and family. After consideration of risks, benefits and other options for treatment, the patient has consented to  Procedure(s) (LRB) with comments: COLONOSCOPY (N/A) as a surgical intervention .  The patient's history has been reviewed, patient examined, no change in status, stable for surgery.  I have reviewed the patient's chart and labs.  Questions were answered to the patient's satisfaction.     The recent H&P (dated **10/08/11*) was reviewed, the patient was examined and there is no change in the patients condition since that H&P was completed.   Erskine Emery  10/16/2011, 8:55 AM   Erskine Emery

## 2011-10-16 NOTE — Progress Notes (Signed)
Patient reports no allergy to fentanyl discussed with Dr Deatra Ina ok to sedate with fentanyl.

## 2011-10-16 NOTE — Interval H&P Note (Signed)
History and Physical Interval Note:  10/16/2011 10:13 AM  Joshua [name]  has presented today for surgery, with the diagnosis of anemia  The various methods of treatment have been discussed with the patient and family. After consideration of risks, benefits and other options for treatment, the patient has consented to  Procedure(s) (LRB) with comments: COLONOSCOPY (N/A) as a surgical intervention .  The patient's history has been reviewed, patient examined, no change in status, stable for surgery.  I have reviewed the patient's chart and labs.  Questions were answered to the patient's satisfaction.    The recent H&P (dated *10/16/11**) was reviewed, the patient was examined and there is no change in the patients condition since that H&P was completed.   Erskine Emery  10/16/2011, 10:14 AM    Erskine Emery

## 2011-10-16 NOTE — H&P (View-Only) (Signed)
History of Present Illness: Pleasant 71 year old white male with history of colon polyps, hypertension, diabetes and COPD referred at the request of Dr. Anitra Lauth for evaluation of anemia.  This was noted on routine testing,  Hemoglobin was 10 and MCV 64. The patient's main complaint is easy fatigability. He's had no overt GI bleeding including melena or hematochezia. He denies abdominal pain pyrosis or dysphagia. He apparantly has a history of colon polyps and last colonoscoped over 10 years ago.  He tends to be constipated.    Past Medical History  Diagnosis Date  . DM type 2 (diabetes mellitus, type 2)   . Hypertension   . Tobacco dependence     Prior failure to quit with wellbutrin  . CKD (chronic kidney disease), stage I   . Hyperlipidemia   . Obesity   . Hyperplastic colon polyp 2001  . COPD (chronic obstructive pulmonary disease)     Spirometry  2004 borderline obstruction  . Erectile dysfunction     Normal testosterone  . Diabetic nephropathy     Elevated urine microalb/cr 03/2011   Past Surgical History  Procedure Date  . Cholecystectomy open 1999  . Appendectomy 1957  . Hip surgery age 32    Right hip dislocation surgery/repair (no metal involved)  . Knee surgery 1979 or so    left; bolt + 3 screws to repair tib plateau fx  . Testicle surgery as a child    Undescended testicle brought down into scrotum  . Tonsillectomy    family history includes Breast cancer in his sister; Diabetes in his maternal uncle and paternal grandmother; and Heart disease in his father and maternal uncle. Current Outpatient Prescriptions  Medication Sig Dispense Refill  . acetaminophen (TYLENOL) 500 MG tablet Take 1,000 mg by mouth 2 (two) times daily as needed.      [name] aspirin 81 MG tablet Take 81 mg by mouth daily.      . chlorthalidone (HYGROTON) 25 MG tablet Take 1 tablet (25 mg total) by mouth daily.  30 tablet  5  . Chromium Picolinate 500 MCG TABS Take 1 tablet by mouth daily.      .  colesevelam (WELCHOL) 625 MG tablet Take 3 tablets (1,875 mg total) by mouth 2 (two) times daily with a meal.  180 tablet  5  . ferrous sulfate 325 (65 FE) MG tablet Take 325 mg by mouth 2 (two) times daily.      [name] glimepiride (AMARYL) 4 MG tablet Take 1 tablet (4 mg total) by mouth 2 (two) times daily.  60 tablet  5  . lovastatin (MEVACOR) 40 MG tablet Take 1 tablet (40 mg total) by mouth 2 (two) times daily.  60 tablet  5  . metFORMIN (GLUCOPHAGE) 1000 MG tablet Take 1 tablet AM and PM and 1/2 tablet at lunch  75 tablet  5  . Multiple Vitamin (MULTIVITAMIN) tablet Take 1 tablet by mouth daily.      . pioglitazone (ACTOS) 45 MG tablet Take 1 tablet (45 mg total) by mouth daily.  30 tablet  5  . ramipril (ALTACE) 10 MG capsule Take 1 capsule (10 mg total) by mouth 2 (two) times daily.  60 capsule  5  . saxagliptin HCl (ONGLYZA) 5 MG TABS tablet Take 1 tablet (5 mg total) by mouth daily.  30 tablet  5   Allergies as of 10/08/2011 - Review Complete 10/08/2011  Allergen Reaction Noted  . Demerol (meperidine)  09/01/2011  . Morphine and related  09/01/2011  . Starlix (nateglinide) Other (See Comments) 09/01/2011    reports that he has been smoking Cigarettes.  He has a 60 pack-year smoking history. His smokeless tobacco use includes Chew. He reports that he does not drink alcohol or use illicit drugs.     Review of Systems: Pertinent positive and negative review of systems were noted in the above HPI section. All other review of systems were otherwise negative.  Vital signs were reviewed in today's medical record Physical Exam: General: Well developed , well nourished, no acute distress Head: Normocephalic and atraumatic Eyes:  sclerae anicteric, EOMI Ears: Normal auditory acuity Mouth: No deformity or lesions Neck: Supple, no masses or thyromegaly Lungs: Clear throughout to auscultation Heart: Regular rate and rhythm; no murmurs, rubs or bruits Abdomen: Soft, non tender and non  distended. No masses, hepatosplenomegaly or hernias noted. Normal Bowel sounds Rectal:deferred Musculoskeletal: Symmetrical with no gross deformities  Skin: No lesions on visible extremities Pulses:  Normal pulses noted Extremities: No clubbing, cyanosis, edema or deformities noted Neurological: Alert oriented x 4, grossly nonfocal Cervical Nodes:  No significant cervical adenopathy Inguinal Nodes: No significant inguinal adenopathy Psychological:  Alert and cooperative. Normal mood and affect

## 2011-10-19 ENCOUNTER — Encounter (HOSPITAL_COMMUNITY): Payer: Self-pay

## 2011-10-19 ENCOUNTER — Encounter (HOSPITAL_COMMUNITY): Payer: Self-pay | Admitting: Gastroenterology

## 2011-10-20 ENCOUNTER — Encounter: Payer: Self-pay | Admitting: Family Medicine

## 2011-10-20 ENCOUNTER — Encounter: Payer: Self-pay | Admitting: Gastroenterology

## 2011-12-16 ENCOUNTER — Telehealth: Payer: Self-pay | Admitting: Gastroenterology

## 2011-12-16 ENCOUNTER — Encounter: Payer: Self-pay | Admitting: Family Medicine

## 2011-12-16 ENCOUNTER — Ambulatory Visit (INDEPENDENT_AMBULATORY_CARE_PROVIDER_SITE_OTHER): Payer: PRIVATE HEALTH INSURANCE | Admitting: Family Medicine

## 2011-12-16 VITALS — BP 144/74 | HR 79 | Ht 71.0 in | Wt 202.0 lb

## 2011-12-16 DIAGNOSIS — D509 Iron deficiency anemia, unspecified: Secondary | ICD-10-CM

## 2011-12-16 DIAGNOSIS — F17200 Nicotine dependence, unspecified, uncomplicated: Secondary | ICD-10-CM

## 2011-12-16 DIAGNOSIS — Z23 Encounter for immunization: Secondary | ICD-10-CM

## 2011-12-16 DIAGNOSIS — I10 Essential (primary) hypertension: Secondary | ICD-10-CM

## 2011-12-16 LAB — HEMOGLOBIN A1C: Hgb A1c MFr Bld: 7.3 % — ABNORMAL HIGH (ref 4.6–6.5)

## 2011-12-16 LAB — CBC WITH DIFFERENTIAL/PLATELET
Eosinophils Absolute: 0.1 10*3/uL (ref 0.0–0.7)
MCHC: 32.8 g/dL (ref 30.0–36.0)
MCV: 78.3 fl (ref 78.0–100.0)
Monocytes Absolute: 0.7 10*3/uL (ref 0.1–1.0)
Neutrophils Relative %: 67.1 % (ref 43.0–77.0)
Platelets: 174 10*3/uL (ref 150.0–400.0)
RDW: 21.4 % — ABNORMAL HIGH (ref 11.5–14.6)

## 2011-12-16 LAB — IBC PANEL: Iron: 45 ug/dL (ref 42–165)

## 2011-12-16 MED ORDER — ZOSTER VACCINE LIVE 19400 UNT/0.65ML ~~LOC~~ SOLR: 0.6500 mL | Freq: Once | SUBCUTANEOUS | Status: DC
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
## 2011-12-16 NOTE — Telephone Encounter (Signed)
Per colon procedure note it states EGD will be set up. When do you want the pt to be scheduled for EGD. Please advise.

## 2011-12-17 NOTE — Telephone Encounter (Signed)
Left message for pt to call back.

## 2011-12-17 NOTE — Telephone Encounter (Signed)
This is not urgent. He needs Hemoccults and elective endoscopy

## 2011-12-20 ENCOUNTER — Encounter: Payer: Self-pay | Admitting: Family Medicine

## 2011-12-20 DIAGNOSIS — F17200 Nicotine dependence, unspecified, uncomplicated: Secondary | ICD-10-CM | POA: Insufficient documentation

## 2011-12-20 NOTE — Assessment & Plan Note (Signed)
Give hemoccult cards to patient again so we can try to get a valid sample/test for occult bleeding in GI tract. Recheck  CBC and iron studies.   Will call GI and find out what the plan is for his EGD (colonoscopy showed polyps but these were not likely the cause of any hemorrhage/anemia). Continue iron replacement at bid dosing for now.

## 2011-12-20 NOTE — Assessment & Plan Note (Addendum)
Control fair as per pt's home monitoring. Check HbA1c today.  Continue current antidiabetic pills for now. He is current UTD on diabetic monitoring for end organ damage. Zostavax rx printed today and handed to him, Tdap vaccine IM given today--patient now UTD on vaccines.
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
## 2011-12-20 NOTE — Assessment & Plan Note (Signed)
Problem stable.  Continue current medications and diet appropriate for this condition.  We have reviewed our general long term plan for this problem and also reviewed symptoms and signs that should prompt the patient to call or return to the office. Check cr/lytes today.

## 2012-01-06 DIAGNOSIS — D509 Iron deficiency anemia, unspecified: Secondary | ICD-10-CM

## 2012-01-06 HISTORY — DX: Iron deficiency anemia, unspecified: D50.9

## 2012-01-12 ENCOUNTER — Other Ambulatory Visit: Payer: Self-pay | Admitting: Gastroenterology

## 2012-01-12 DIAGNOSIS — K921 Melena: Secondary | ICD-10-CM

## 2012-01-12 NOTE — Telephone Encounter (Signed)
Pt scheduled for previsit 02/15/12_0  and EGD with Dr. Deatra Ina in the [facility] 02/25/12_1 . Pt aware of appt dates and times.

## 2012-02-11 ENCOUNTER — Telehealth: Payer: Self-pay | Admitting: *Deleted

## 2012-02-11 NOTE — Telephone Encounter (Signed)
Dr. Deatra Ina, This pt had his colonoscopy at [facility].  Do you want his EGD there or is it ok at [facility]?  Thanks, [employer]

## 2012-02-12 ENCOUNTER — Other Ambulatory Visit: Payer: Self-pay | Admitting: *Deleted

## 2012-02-12 MED ORDER — COLESEVELAM HCL 625 MG PO TABS: 1875.0000 mg | ORAL_TABLET | Freq: Two times a day (BID) | ORAL | Status: DC

## 2012-02-14 NOTE — Telephone Encounter (Signed)
Ok at [facility]

## 2012-02-15 ENCOUNTER — Ambulatory Visit (AMBULATORY_SURGERY_CENTER): Payer: PRIVATE HEALTH INSURANCE | Admitting: *Deleted

## 2012-02-15 VITALS — Ht >= 80 in | Wt 200.0 lb

## 2012-02-15 DIAGNOSIS — Z1211 Encounter for screening for malignant neoplasm of colon: Secondary | ICD-10-CM | POA: Insufficient documentation

## 2012-02-15 DIAGNOSIS — D509 Iron deficiency anemia, unspecified: Secondary | ICD-10-CM

## 2012-02-15 MED ORDER — SUPREP BOWEL PREP KIT 17.5-3.13-1.6 GM/177ML PO SOLN: ORAL | Status: DC

## 2012-02-25 ENCOUNTER — Encounter: Payer: Self-pay | Admitting: Gastroenterology

## 2012-02-25 ENCOUNTER — Ambulatory Visit (AMBULATORY_SURGERY_CENTER): Payer: PRIVATE HEALTH INSURANCE | Admitting: Gastroenterology

## 2012-02-25 ENCOUNTER — Other Ambulatory Visit: Payer: Self-pay | Admitting: Gastroenterology

## 2012-02-25 VITALS — BP 124/63 | HR 70 | Temp 96.0°F | Resp 20 | Ht 71.0 in | Wt 200.0 lb

## 2012-02-25 DIAGNOSIS — K2970 Gastritis, unspecified, without bleeding: Secondary | ICD-10-CM

## 2012-02-25 DIAGNOSIS — K2940 Chronic atrophic gastritis without bleeding: Secondary | ICD-10-CM

## 2012-02-25 DIAGNOSIS — K2990 Gastroduodenitis, unspecified, without bleeding: Secondary | ICD-10-CM

## 2012-02-25 DIAGNOSIS — D509 Iron deficiency anemia, unspecified: Secondary | ICD-10-CM

## 2012-02-25 HISTORY — PX: ESOPHAGOGASTRODUODENOSCOPY: SHX1529

## 2012-02-25 HISTORY — DX: Chronic atrophic gastritis without bleeding: K29.40

## 2012-02-25 MED ORDER — SODIUM CHLORIDE 0.9 % IV SOLN: 500.0000 mL | INTRAVENOUS | Status: DC

## 2012-02-25 NOTE — Progress Notes (Signed)
Lidocaine-40mg IV prior to Propofol InductionPropofol given over incremental dosages 

## 2012-02-25 NOTE — Patient Instructions (Addendum)
YOU HAD AN ENDOSCOPIC PROCEDURE TODAY AT [facility]: Refer to the procedure report that was given to you for any specific questions about what was found during the examination.  If the procedure report does not answer your questions, please call your gastroenterologist to clarify.  If you requested that your care partner not be given the details of your procedure findings, then the procedure report has been included in a sealed envelope for you to review at your convenience later.  YOU SHOULD EXPECT: Some feelings of bloating in the abdomen. Passage of more gas than usual.  Walking can help get rid of the air that was put into your GI tract during the procedure and reduce the bloating. If you had a lower endoscopy (such as a colonoscopy or flexible sigmoidoscopy) you may notice spotting of blood in your stool or on the toilet paper. If you underwent a bowel prep for your procedure, then you may not have a normal bowel movement for a few days.  DIET: Your first meal following the procedure should be a light meal and then it is ok to progress to your normal diet.  A half-sandwich or bowl of soup is an example of a good first meal.  Heavy or fried foods are harder to digest and may make you feel nauseous or bloated.  Likewise meals heavy in dairy and vegetables can cause extra gas to form and this can also increase the bloating.  Drink plenty of fluids but you should avoid alcoholic beverages for 24 hours.  ACTIVITY: Your care partner should take you home directly after the procedure.  You should plan to take it easy, moving slowly for the rest of the day.  You can resume normal activity the day after the procedure however you should NOT DRIVE or use heavy machinery for 24 hours (because of the sedation medicines used during the test).    SYMPTOMS TO REPORT IMMEDIATELY: A gastroenterologist can be reached at any hour.  During normal business hours, 8:30 AM to 5:00 PM Monday through Friday,  call [phone].  After hours and on weekends, please call the GI answering service at [phone] emergency number who will take a message and have the physician on call contact you.   Following upper endoscopy (EGD)  Vomiting of blood or coffee ground material  New chest pain or pain under the shoulder blades  Painful or persistently difficult swallowing  New shortness of breath  Fever of 100F or higher  Black, tarry-looking stools  FOLLOW UP: If any biopsies were taken you will be contacted by phone or by letter within the next 1-3 weeks.  Call your gastroenterologist if you have not heard about the biopsies in 3 weeks.  Our staff will call the home number listed on your records the next business day following your procedure to check on you and address any questions or concerns that you may have at that time regarding the information given to you following your procedure. This is a courtesy call and so if there is no answer at the home number and we have not heard from you through the emergency physician on call, we will assume that you have returned to your regular daily activities without incident.  SIGNATURES/CONFIDENTIALITY: You and/or your care partner have signed paperwork which will be entered into your electronic medical record.  These signatures attest to the fact that that the information above on your After Visit Summary has been reviewed and is understood.  Full responsibility of the confidentiality of this discharge information lies with you and/or your care-partner.  Dr Kelby Fam office will schedule a capsule endoscopy and call you with that date and time. Follow up hemmocult cards given today

## 2012-02-25 NOTE — Progress Notes (Signed)
Patient did not experience any of the following events: a burn prior to discharge; a fall within the facility; wrong site/side/patient/procedure/implant event; or a hospital transfer or hospital admission upon discharge from the facility. ([id number])Patient did not have preoperative order for IV antibiotic SSI prophylaxis. [phone]) ewm

## 2012-02-25 NOTE — Op Note (Signed)
[facility]  [address] [place], [id number]   ENDOSCOPY PROCEDURE REPORT  PATIENT: Joshua, [name]  MR#: [id number] BIRTHDATE: 1940/10/10 , 71  yrs. old GENDER: Male ENDOSCOPIST: Inda Castle, MD REFERRED BY:  Ricardo Jericho, M.D. PROCEDURE DATE:  02/25/2012 PROCEDURE:  EGD w/ biopsy ASA CLASS:     Class II INDICATIONS:  Iron deficiency anemia. MEDICATIONS: MAC sedation, administered by CRNA, propofol (Diprivan) 152m IV, Robinul 0.2 mg IV, and Simethicone 0.6cc PO TOPICAL ANESTHETIC: Cetacaine Spray  DESCRIPTION OF PROCEDURE: After the risks benefits and alternatives of the procedure were thoroughly explained, informed consent was obtained.  The LB GIF-H180 [id number] was introduced through the mouth and advanced to the third portion of the duodenum. Without limitations.  The instrument was slowly withdrawn as the mucosa was fully examined.      There was diffuse gastritis characterized by atrophic appearing mucosa.  There were a few erosions.  Biopsies were taken.   There was diffuse gastritis characterized by atrophic appearing mucosa. There were a few erosions.  Biopsies were taken.   The remainder of the upper endoscopy exam was otherwise normal.  Retroflexed views revealed no abnormalities.     The scope was then withdrawn from the patient and the procedure completed.  COMPLICATIONS: There were no complications. ENDOSCOPIC IMPRESSION: 1.   There was diffuse gastritis characterized by atrophic appearing mucosa.  There were a few erosions.  Biopsies were taken.  3.   The remainder of the upper endoscopy exam was otherwise normal  RECOMMENDATIONS: 1.  await biopsy 2.  followup Hemoccults 3.  capsule endoscopy  REPEAT EXAM:  eSigned:  RInda Castle MD 02/25/2012 8:20 AM   CC:  PATIENT NAME:  [name], MillonMR#: [id number]

## 2012-02-25 NOTE — Progress Notes (Signed)
Called to room to assist during endoscopic procedure.  Patient ID and intended procedure confirmed with present staff. Received instructions for my participation in the procedure from the performing physician.

## 2012-02-26 ENCOUNTER — Telehealth: Payer: Self-pay | Admitting: *Deleted

## 2012-02-26 NOTE — Telephone Encounter (Signed)
  Follow up Call-  Call back number 02/25/2012  Post procedure Call Back phone  # [phone]  Permission to leave phone message Yes     Patient questions:  Do you have a fever, pain , or abdominal swelling? no Pain Score  0 *  Have you tolerated food without any problems? yes  Have you been able to return to your normal activities? yes  Do you have any questions about your discharge instructions: Diet   no Medications  no Follow up visit  no  Do you have questions or concerns about your Care? no  Actions: * If pain score is 4 or above: No action needed, pain <4.

## 2012-03-02 ENCOUNTER — Encounter: Payer: Self-pay | Admitting: Gastroenterology

## 2012-03-04 ENCOUNTER — Other Ambulatory Visit: Payer: PRIVATE HEALTH INSURANCE

## 2012-03-04 DIAGNOSIS — K921 Melena: Secondary | ICD-10-CM

## 2012-03-04 LAB — HEMOCCULT SLIDES (X 3 CARDS)
OCCULT 1: NEGATIVE
OCCULT 2: NEGATIVE
OCCULT 5: NEGATIVE

## 2012-03-07 ENCOUNTER — Encounter: Payer: Self-pay | Admitting: Family Medicine

## 2012-03-07 ENCOUNTER — Ambulatory Visit: Payer: PRIVATE HEALTH INSURANCE | Admitting: Gastroenterology

## 2012-03-07 DIAGNOSIS — D509 Iron deficiency anemia, unspecified: Secondary | ICD-10-CM

## 2012-03-07 NOTE — Progress Notes (Signed)
Patient here for capsule endo teaching.  He is scheduled for 03/18/12.

## 2012-03-16 ENCOUNTER — Ambulatory Visit (INDEPENDENT_AMBULATORY_CARE_PROVIDER_SITE_OTHER): Payer: PRIVATE HEALTH INSURANCE | Admitting: Family Medicine

## 2012-03-16 ENCOUNTER — Encounter: Payer: Self-pay | Admitting: Family Medicine

## 2012-03-16 VITALS — BP 150/72 | HR 80 | Ht 71.0 in | Wt 199.0 lb

## 2012-03-16 DIAGNOSIS — D509 Iron deficiency anemia, unspecified: Secondary | ICD-10-CM

## 2012-03-16 DIAGNOSIS — IMO0001 Reserved for inherently not codable concepts without codable children: Secondary | ICD-10-CM

## 2012-03-16 DIAGNOSIS — I10 Essential (primary) hypertension: Secondary | ICD-10-CM

## 2012-03-16 DIAGNOSIS — R142 Eructation: Secondary | ICD-10-CM

## 2012-03-16 DIAGNOSIS — E785 Hyperlipidemia, unspecified: Secondary | ICD-10-CM

## 2012-03-16 DIAGNOSIS — E119 Type 2 diabetes mellitus without complications: Secondary | ICD-10-CM

## 2012-03-16 DIAGNOSIS — R143 Flatulence: Secondary | ICD-10-CM

## 2012-03-16 DIAGNOSIS — R141 Gas pain: Secondary | ICD-10-CM

## 2012-03-16 MED ORDER — RAMIPRIL 10 MG PO CAPS: 10.0000 mg | ORAL_CAPSULE | Freq: Two times a day (BID) | ORAL | Status: DC

## 2012-03-16 MED ORDER — LOVASTATIN 40 MG PO TABS: 40.0000 mg | ORAL_TABLET | Freq: Two times a day (BID) | ORAL | Status: DC

## 2012-03-16 MED ORDER — SAXAGLIPTIN HCL 5 MG PO TABS: 5.0000 mg | ORAL_TABLET | Freq: Every day | ORAL | Status: DC

## 2012-03-16 MED ORDER — PIOGLITAZONE HCL 45 MG PO TABS: 45.0000 mg | ORAL_TABLET | Freq: Every day | ORAL | Status: DC

## 2012-03-16 MED ORDER — METFORMIN HCL 1000 MG PO TABS: ORAL_TABLET | ORAL | Status: DC

## 2012-03-16 MED ORDER — CHLORTHALIDONE 25 MG PO TABS: 25.0000 mg | ORAL_TABLET | Freq: Every day | ORAL | Status: DC

## 2012-03-16 MED ORDER — GLIMEPIRIDE 4 MG PO TABS: 4.0000 mg | ORAL_TABLET | Freq: Two times a day (BID) | ORAL | Status: DC

## 2012-03-16 NOTE — Assessment & Plan Note (Signed)
Lab Results  Component Value Date   HGBA1C 7.3* 12/16/2011   Recheck HbA1c soon when he returns for FLP.

## 2012-03-16 NOTE — Assessment & Plan Note (Signed)
Likely secondary to welchol. D/c welchol.

## 2012-03-16 NOTE — Assessment & Plan Note (Signed)
Return at earliest convenience for [place]. We are stopping welchol today secondary to excessive flatulence/gas.

## 2012-03-16 NOTE — Assessment & Plan Note (Signed)
We'll recheck CBC today. He'll restart iron after capsule endoscopy which is in 2d.

## 2012-03-16 NOTE — Assessment & Plan Note (Signed)
Stable. Will check chemistry panel with fasting labs soon.

## 2012-03-16 NOTE — Progress Notes (Signed)
OFFICE NOTE  03/16/2012  CC:  Chief Complaint  Patient presents with  . Follow-up    DM     HPI: Patient is a 72 y.o. Caucasian male who is here for 3 mo f/u hyperlip, DM 2, iron def anemia.   Pt feels like welchol is making him very gassy. Glucose checks, fasting: range 70-110.  He is compliant with his meds, tries to avoid foods that he knows make his sugars elevated.   Iron def anemia:  He has been off of iron replacement as per GI orders b/c he has a capsule endoscopy scheduled in 2 days. Compliant with bp meds.  No home bps to report.  Pertinent PMH:  Past Medical History  Diagnosis Date  . DM type 2 (diabetes mellitus, type 2)   . Hypertension   . Tobacco dependence     Prior failure to quit with wellbutrin.  100+ pack-yr history  . CKD (chronic kidney disease), stage I   . Hyperlipidemia   . Obesity   . Hyperplastic colon polyp 2001  . COPD (chronic obstructive pulmonary disease)     Spirometry  2004 borderline obstruction  . Erectile dysfunction     Normal testosterone  . Diabetic nephropathy     Elevated urine microalb/cr 03/2011  . Shortness of breath     exertion  . Arthritis     hip  . Anemia   . Adenomatous colon polyp 10/16/2011  . 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 pylori neg, no dysplasia or malignancy.    MEDS:  Outpatient Prescriptions Prior to Visit  Medication Sig Dispense Refill  . acetaminophen (TYLENOL) 500 MG tablet Take 1,000 mg by mouth 2 (two) times daily as needed.      [name] aspirin 81 MG tablet Take 81 mg by mouth daily.      . colesevelam (WELCHOL) 625 MG tablet Take 1,250 mg by mouth 3 (three) times daily.      . ferrous sulfate 325 (65 FE) MG tablet Take 325 mg by mouth 2 (two) times daily.      . Multiple Vitamin (MULTIVITAMIN) tablet Take 1 tablet by mouth daily.      [name] zoster vaccine live, PF, (ZOSTAVAX) 40102 UNT/0.65ML injection Inject 19,400 Units into the skin once.  1 vial  0  .  chlorthalidone (HYGROTON) 25 MG tablet Take 1 tablet (25 mg total) by mouth daily.  30 tablet  5  . glimepiride (AMARYL) 4 MG tablet Take 1 tablet (4 mg total) by mouth 2 (two) times daily.  60 tablet  5  . lovastatin (MEVACOR) 40 MG tablet Take 1 tablet (40 mg total) by mouth 2 (two) times daily.  60 tablet  5  . metFORMIN (GLUCOPHAGE) 1000 MG tablet Take 1 tablet AM and PM and 1/2 tablet at lunch  75 tablet  5  . pioglitazone (ACTOS) 45 MG tablet Take 1 tablet (45 mg total) by mouth daily.  30 tablet  5  . ramipril (ALTACE) 10 MG capsule Take 1 capsule (10 mg total) by mouth 2 (two) times daily.  60 capsule  5  . saxagliptin HCl (ONGLYZA) 5 MG TABS tablet Take 1 tablet (5 mg total) by mouth daily.  30 tablet  5  . Chromium Picolinate 500 MCG TABS Take 1 tablet by mouth daily.       No facility-administered medications prior to visit.    PE: Blood pressure 150/72, pulse 80, height _0  (1.803 m),  weight 199 lb (90.266 kg). Gen: Alert, well appearing.  Patient is oriented to person, place, time, and situation. AFFECT: pleasant, lucid thought and speech. No further exam today.  IMPRESSION AND PLAN:  Excessive gas Likely secondary to welchol. D/c welchol.  Iron deficiency anemia We'll recheck CBC today. He'll restart iron after capsule endoscopy which is in 2d.  Type II or unspecified type diabetes mellitus without mention of complication, uncontrolled Lab Results  Component Value Date   HGBA1C 7.3* 12/16/2011   Recheck HbA1c soon when he returns for FLP.   Hyperlipidemia Return at earliest convenience for FLP. We are stopping welchol today secondary to excessive flatulence/gas.  HTN (hypertension), benign Stable. Will check chemistry panel with fasting labs soon.   An After Visit Summary was printed and given to the patient.  FOLLOW UP: 74mo

## 2012-03-18 ENCOUNTER — Ambulatory Visit (INDEPENDENT_AMBULATORY_CARE_PROVIDER_SITE_OTHER): Payer: PRIVATE HEALTH INSURANCE | Admitting: Gastroenterology

## 2012-03-18 ENCOUNTER — Other Ambulatory Visit (INDEPENDENT_AMBULATORY_CARE_PROVIDER_SITE_OTHER): Payer: PRIVATE HEALTH INSURANCE

## 2012-03-18 DIAGNOSIS — D509 Iron deficiency anemia, unspecified: Secondary | ICD-10-CM

## 2012-03-18 DIAGNOSIS — E119 Type 2 diabetes mellitus without complications: Secondary | ICD-10-CM

## 2012-03-18 DIAGNOSIS — E785 Hyperlipidemia, unspecified: Secondary | ICD-10-CM

## 2012-03-18 LAB — CBC WITH DIFFERENTIAL/PLATELET
Eosinophils Relative: 0.3 % (ref 0.0–5.0)
HCT: 44.1 % (ref 39.0–52.0)
Hemoglobin: 14.6 g/dL (ref 13.0–17.0)
Lymphs Abs: 2.1 10*3/uL (ref 0.7–4.0)
MCV: 85.3 fl (ref 78.0–100.0)
Monocytes Absolute: 0.7 10*3/uL (ref 0.1–1.0)
Neutro Abs: 6.1 10*3/uL (ref 1.4–7.7)
Platelets: 182 10*3/uL (ref 150.0–400.0)
WBC: 9 10*3/uL (ref 4.5–10.5)

## 2012-03-18 LAB — LIPID PANEL
Cholesterol: 115 mg/dL (ref 0–200)
LDL Cholesterol: 50 mg/dL (ref 0–99)
Total CHOL/HDL Ratio: 3

## 2012-03-18 LAB — HEMOGLOBIN A1C: Hgb A1c MFr Bld: 7.5 % — ABNORMAL HIGH (ref 4.6–6.5)

## 2012-03-18 LAB — COMPREHENSIVE METABOLIC PANEL
ALT: 15 U/L (ref 0–53)
AST: 21 U/L (ref 0–37)
Albumin: 4 g/dL (ref 3.5–5.2)
Alkaline Phosphatase: 57 U/L (ref 39–117)
Potassium: 4.8 mEq/L (ref 3.5–5.1)
Sodium: 139 mEq/L (ref 135–145)
Total Bilirubin: 1 mg/dL (ref 0.3–1.2)
Total Protein: 7.2 g/dL (ref 6.0–8.3)

## 2012-03-18 NOTE — Progress Notes (Signed)
Labs only

## 2012-03-18 NOTE — Progress Notes (Signed)
Pt in this am and states he followed the prep instructions and has had good results. He states he drank some black coffee about 5 am, but nothing since. Pt swallowed the capsule w/o difficulty and lay down for 30 minutes. No problems during this time and pt was discharged home with instructions for eating and drinking times. CAPSULE LOT 2013- 51/23971S   25 Pt arrived back at 1600 and denies any problems. He was discharged home and computer module inserted to create the video.

## 2012-03-23 ENCOUNTER — Encounter: Payer: Self-pay | Admitting: Family Medicine

## 2012-03-31 ENCOUNTER — Encounter: Payer: Self-pay | Admitting: Gastroenterology

## 2012-03-31 NOTE — Progress Notes (Signed)
Patient ID: Joshua [name], male   DOB: 08/02/1940, 73 y.o.   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  Plan to continue Fe supplementation indefinately

## 2012-04-01 ENCOUNTER — Telehealth: Payer: Self-pay

## 2012-04-01 NOTE — Telephone Encounter (Signed)
Pt scheduled to see Dr. Deatra Ina 04/18/12_0 :30pm. Wife aware of appt date and time.

## 2012-04-01 NOTE — Telephone Encounter (Signed)
Message copied by Algernon Huxley on Fri Apr 01, 2012  9:15 AM ------      Message from: Erskine Emery D      Created: Thu Mar 31, 2012  4:07 PM       Please schedule OV ------

## 2012-04-11 ENCOUNTER — Encounter: Payer: Self-pay | Admitting: Gastroenterology

## 2012-04-18 ENCOUNTER — Encounter: Payer: Self-pay | Admitting: Gastroenterology

## 2012-04-18 ENCOUNTER — Ambulatory Visit (INDEPENDENT_AMBULATORY_CARE_PROVIDER_SITE_OTHER): Payer: PRIVATE HEALTH INSURANCE | Admitting: Gastroenterology

## 2012-04-18 VITALS — BP 124/50 | HR 80 | Ht 70.0 in | Wt 200.0 lb

## 2012-04-18 DIAGNOSIS — D126 Benign neoplasm of colon, unspecified: Secondary | ICD-10-CM

## 2012-04-18 DIAGNOSIS — D509 Iron deficiency anemia, unspecified: Secondary | ICD-10-CM

## 2012-04-18 NOTE — Assessment & Plan Note (Signed)
Plan followup colonoscopy 2008

## 2012-04-18 NOTE — Patient Instructions (Addendum)
You have been given a separate informational sheet regarding your tobacco use, the importance of quitting and local resources to help you quit. Continue Iron Indefinitely

## 2012-04-18 NOTE — Progress Notes (Signed)
History of Present Illness: Mr.  [name] has returned for followup of anemia. Upper endoscopy demonstrated chronic gastritis. 2 small adenomatous polyps were removed from the colon. Capsule endoscopy demonstrated 2 small AVMs which was felt to be the source for his chronic GI blood loss. He takes supplemental iron. Last hemoglobin was 14.6. He has no GI complaints.    Past Medical History  Diagnosis Date  . DM type 2 (diabetes mellitus, type 2)   . Hypertension   . Tobacco dependence     Prior failure to quit with wellbutrin.  100+ pack-yr history  . CKD (chronic kidney disease), stage I   . Hyperlipidemia   . Obesity   . Hyperplastic colon polyp 2001  . COPD (chronic obstructive pulmonary disease)     Spirometry  2004 borderline obstruction  . Erectile dysfunction     Normal testosterone  . Diabetic nephropathy     Elevated urine microalb/cr 03/2011  . Shortness of breath     exertion  . Arthritis     hip  . Anemia   . Adenomatous colon polyp 10/16/2011  . 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 pylori neg, no dysplasia or malignancy.  . Macular degeneration, dry     Mild, bilat (Optometrist, Mayford Knife at [facility] [facility] in [place], [place])   Past Surgical History  Procedure Laterality Date  . Cholecystectomy open  1999  . Appendectomy  1957  . Hip surgery  age 76    Right hip dislocation surgery/repair (no metal involved)  . Knee surgery  1979 or so    left; bolt + 3 screws to repair tib plateau fx  . Testicle surgery  as a child    Undescended testicle brought down into scrotum  . Tonsillectomy  1947  . Colonoscopy  10/16/2011    Procedure: COLONOSCOPY;  Surgeon: Inda Castle, MD;  Location: [facility];  Service: Endoscopy;  Laterality: N/A;  . Cataract extraction w/ intraocular lens  implant, bilateral  04/08/2006 & 04/22/2006  . Esophagogastroduodenoscopy  02/25/12    Atrophic gastritis with a few erosions--capsule  endoscopy planned as of 02/25/12 (Dr. Deatra Ina).   family history includes Breast cancer in his sister; Diabetes in his maternal uncle and paternal grandmother; and Heart disease in his father and maternal uncle. Current Outpatient Prescriptions  Medication Sig Dispense Refill  . acetaminophen (TYLENOL) 500 MG tablet Take 1,000 mg by mouth 2 (two) times daily as needed.      [name] aspirin 81 MG tablet Take 81 mg by mouth daily.      . chlorthalidone (HYGROTON) 25 MG tablet Take 1 tablet (25 mg total) by mouth daily.  30 tablet  5  . Cholecalciferol (VITAMIN D-3) 1000 UNITS CAPS Take 1 capsule by mouth daily.      . Chromium Picolinate 1000 MCG TABS Take 1 tablet by mouth daily.      . colesevelam (WELCHOL) 625 MG tablet Take 1,250 mg by mouth 3 (three) times daily.      . ferrous sulfate 325 (65 FE) MG tablet Take 325 mg by mouth 2 (two) times daily.      [name] glimepiride (AMARYL) 4 MG tablet Take 1 tablet (4 mg total) by mouth 2 (two) times daily.  60 tablet  5  . lovastatin (MEVACOR) 40 MG tablet Take 1 tablet (40 mg total) by mouth 2 (two) times daily.  60 tablet  5  . metFORMIN (GLUCOPHAGE) 1000 MG tablet Take  1 tablet AM and PM and 1/2 tablet at lunch  75 tablet  5  . Multiple Vitamin (MULTIVITAMIN) tablet Take 1 tablet by mouth daily.      . pioglitazone (ACTOS) 45 MG tablet Take 1 tablet (45 mg total) by mouth daily.  30 tablet  5  . ramipril (ALTACE) 10 MG capsule Take 1 capsule (10 mg total) by mouth 2 (two) times daily.  60 capsule  5  . saxagliptin HCl (ONGLYZA) 5 MG TABS tablet Take 1 tablet (5 mg total) by mouth daily.  30 tablet  5  . zoster vaccine live, PF, (ZOSTAVAX) 25189 UNT/0.65ML injection Inject 19,400 Units into the skin once.  1 vial  0   No current facility-administered medications for this visit.   Allergies as of 04/18/2012 - Review Complete 04/18/2012  Allergen Reaction Noted  . Demerol (meperidine) Nausea Only 09/01/2011  . Morphine and related Other (See Comments)  09/01/2011  . Starlix (nateglinide) Other (See Comments) 09/01/2011    reports that he has been smoking Cigarettes.  He has a 60 pack-year smoking history. He uses smokeless tobacco. He reports that he does not drink alcohol or use illicit drugs.     Review of Systems: Pertinent positive and negative review of systems were noted in the above HPI section. All other review of systems were otherwise negative.  Vital signs were reviewed in today's medical record Physical Exam: General: Well developed , well nourished, no acute distress

## 2012-04-18 NOTE — Assessment & Plan Note (Signed)
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  Recommendations #1 continue iron supplementation indefinitely #2 check CBC in 6 and 12 months. I instructed the patient to get these blood tests at [facility] office

## 2012-05-02 ENCOUNTER — Ambulatory Visit: Payer: PRIVATE HEALTH INSURANCE | Admitting: Gastroenterology

## 2012-06-30 ENCOUNTER — Telehealth: Payer: Self-pay | Admitting: Emergency Medicine

## 2012-06-30 NOTE — Telephone Encounter (Signed)
Joshua [name] came  into the office on 06-30-12 and would like know if Dr.Mcgowen would send a letter to [facility] for clearance for Hip Replacement surgery. Patient needs to pick up before 07-11-12,please contact patient when ready for pick up.

## 2012-07-01 ENCOUNTER — Encounter: Payer: Self-pay | Admitting: Family Medicine

## 2012-07-01 NOTE — Telephone Encounter (Signed)
I need to see him for pre-op medical clearance office visit (30 min) before I can write this letter.  Does not need to be fasting.-thx

## 2012-07-01 NOTE — Telephone Encounter (Signed)
Patient said that any afternoon (after 1:30 pm) would be fine with him. Call and let him know the appointment date and time. Thanks. (30 min)

## 2012-07-01 NOTE — Telephone Encounter (Signed)
Please advise?

## 2012-07-07 ENCOUNTER — Encounter: Payer: Self-pay | Admitting: Family Medicine

## 2012-07-07 ENCOUNTER — Ambulatory Visit (INDEPENDENT_AMBULATORY_CARE_PROVIDER_SITE_OTHER)
Admission: RE | Admit: 2012-07-07 | Discharge: 2012-07-07 | Disposition: A | Discharge status: Home / Self Care | Payer: PRIVATE HEALTH INSURANCE | Source: Ambulatory Visit | Attending: Family Medicine | Admitting: Family Medicine

## 2012-07-07 ENCOUNTER — Ambulatory Visit (INDEPENDENT_AMBULATORY_CARE_PROVIDER_SITE_OTHER): Payer: PRIVATE HEALTH INSURANCE | Admitting: Family Medicine

## 2012-07-07 VITALS — BP 147/69 | HR 76 | Temp 98.3°F | Resp 16 | Ht 71.0 in | Wt 196.0 lb

## 2012-07-07 DIAGNOSIS — Z720 Tobacco use: Secondary | ICD-10-CM

## 2012-07-07 DIAGNOSIS — F17200 Nicotine dependence, unspecified, uncomplicated: Secondary | ICD-10-CM

## 2012-07-07 DIAGNOSIS — E611 Iron deficiency: Secondary | ICD-10-CM

## 2012-07-07 DIAGNOSIS — D509 Iron deficiency anemia, unspecified: Secondary | ICD-10-CM

## 2012-07-07 DIAGNOSIS — Z01818 Encounter for other preprocedural examination: Secondary | ICD-10-CM

## 2012-07-07 LAB — CBC WITH DIFFERENTIAL/PLATELET
Eosinophils Absolute: 0.1 10*3/uL (ref 0.0–0.7)
HCT: 44.3 % (ref 39.0–52.0)
Lymphs Abs: 2.2 10*3/uL (ref 0.7–4.0)
MCHC: 33.4 g/dL (ref 30.0–36.0)
MCV: 88.3 fl (ref 78.0–100.0)
Monocytes Absolute: 0.8 10*3/uL (ref 0.1–1.0)
Neutrophils Relative %: 60.8 % (ref 43.0–77.0)
Platelets: 155 10*3/uL (ref 150.0–400.0)

## 2012-07-07 LAB — HEMOGLOBIN A1C: Hgb A1c MFr Bld: 7.7 % — ABNORMAL HIGH (ref 4.6–6.5)

## 2012-07-07 MED ORDER — FERROUS SULFATE 325 (65 FE) MG PO TABS: 325.0000 mg | ORAL_TABLET | Freq: Two times a day (BID) | ORAL | Status: DC

## 2012-07-07 MED ORDER — METFORMIN HCL 1000 MG PO TABS: ORAL_TABLET | ORAL | Status: DC

## 2012-07-07 NOTE — Assessment & Plan Note (Signed)
This has resolved. 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

## 2012-07-07 NOTE — Assessment & Plan Note (Addendum)
Pt says he is doing the best he can with his diet and activity. If HbA1c recheck today is increased compared to last check then we'll d/c amaryl and start victoza (pt said insurance formulary has this, but not byetta--which he was on in the past).  We reviewed the possible option of adding the newest antidiabetic pill [name] but decided to have this as a possible back-up option---he is higher risk for complications on this med given his age and being on so many other oral antidiabetic agents. For now, though, he'll continue all current diabetic meds at current doses except I'd like him to decrease his metformin to 1000 mg bid instead of 2560m per day. Pt is due for urine microalb/cr so will check this today.

## 2012-07-07 NOTE — Assessment & Plan Note (Signed)
Encouraged complete cessation.

## 2012-07-07 NOTE — Progress Notes (Signed)
OFFICE NOTE  07/07/2012  CC:  Chief Complaint  Patient presents with  . Pre-op Exam    hip surgery     HPI: Patient is a 72 y.o. Caucasian male who is here for medical clearance for right total hip replacement with Dr. Ninfa Linden. Surgery is not scheduled yet. He is active and denies any CP, SOB, or palpitations.  He describes fairly strenuous yard work like weed-wacking and push mowing and has no complaints that this bothers him at all, even in the heat yesterday. Glucoses: fasting 120-135 typically, rarely 170 or so with bad dietary indiscretion.   Occ check later in the day 150s.  He attributes his higher glucoses to being off welchol, but says the gas problem he was having resolved with d/c of this med. Taking all meds regularly. 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 Denies any problem with anesthesia with his surgeries in the past.  He is still smoking but says he plans on switching to electronic cigarettes about 3 days prior to his surgery.  Pertinent PMH:  Past Medical History  Diagnosis Date  . DM type 2 (diabetes mellitus, type 2)   . Hypertension   . Tobacco dependence     Prior failure to quit with wellbutrin.  100+ pack-yr history  . CKD (chronic kidney disease), stage I   . Hyperlipidemia   . Obesity   . Hyperplastic colon polyp 2001  . COPD (chronic obstructive pulmonary disease)     Spirometry  2004 borderline obstruction  . Erectile dysfunction     Normal testosterone  . Diabetic nephropathy     Elevated urine microalb/cr 03/2011  . Shortness of breath     exertion  . Arthritis     hip  . Iron deficiency anemia 2014    03/2012 capsule endoscopy showed 2 AVMs--likely responsible for his IDA--lifetime iron supp recommended + q53moCBCs.  . Adenomatous colon polyp 10/16/2011    Repeat 2018  . 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 pylori neg, no  dysplasia or malignancy.  . Macular degeneration, dry     Mild, bilat (Optometrist, [place]  . History of pneumonia   **No past history of angina, CAD, CHF, or cardiac valvular disease.   **No past history of COPD.  Past Surgical History  Procedure Laterality Date  . Cholecystectomy open  1999  . Appendectomy  1957  . Hip surgery  age 72   Right hip dislocation surgery/repair (no metal involved)  . Knee surgery  1979 or so    left; bolt + 3 screws to repair tib plateau fx  . Testicle surgery  as a child    Undescended testicle brought down into scrotum  . Tonsillectomy  1947  . Colonoscopy  10/16/2011    Procedure: COLONOSCOPY;  Surgeon: RInda Castle MD;  Location: [facility];  Service: Endoscopy;  Laterality: N/A;  . Cataract extraction w/ intraocular lens  implant, bilateral  04/08/2006 & 04/22/2006  . Esophagogastroduodenoscopy  02/25/12    Atrophic gastritis with a few erosions--capsule endoscopy planned as of 02/25/12 (Dr. KDeatra Ina.   Past family and social history reviewed and there are no changes since the patient's last office visit with me.  MEDS:  Outpatient Prescriptions Prior to Visit  Medication Sig Dispense Refill  . acetaminophen (TYLENOL) 500 MG tablet  Take 1,000 mg by mouth 2 (two) times daily as needed.      [name] aspirin 81 MG tablet Take 81 mg by mouth daily.      . chlorthalidone (HYGROTON) 25 MG tablet Take 1 tablet (25 mg total) by mouth daily.  30 tablet  5  . Cholecalciferol (VITAMIN D-3) 1000 UNITS CAPS Take 1 capsule by mouth daily.      . Chromium Picolinate 1000 MCG TABS Take 1 tablet by mouth daily.      [name] glimepiride (AMARYL) 4 MG tablet Take 1 tablet (4 mg total) by mouth 2 (two) times daily.  60 tablet  5  . lovastatin (MEVACOR) 40 MG tablet Take 1 tablet (40 mg total) by mouth 2 (two) times daily.  60 tablet  5  . Multiple Vitamin (MULTIVITAMIN) tablet Take 1 tablet by mouth daily.      . pioglitazone  (ACTOS) 45 MG tablet Take 1 tablet (45 mg total) by mouth daily.  30 tablet  5  . ramipril (ALTACE) 10 MG capsule Take 1 capsule (10 mg total) by mouth 2 (two) times daily.  60 capsule  5  . saxagliptin HCl (ONGLYZA) 5 MG TABS tablet Take 1 tablet (5 mg total) by mouth daily.  30 tablet  5  . zoster vaccine live, PF, (ZOSTAVAX) 09983 UNT/0.65ML injection Inject 19,400 Units into the skin once.  1 vial  0  . ferrous sulfate 325 (65 FE) MG tablet Take 325 mg by mouth 2 (two) times daily.      . metFORMIN (GLUCOPHAGE) 1000 MG tablet Take 1 tablet AM and PM and 1/2 tablet at lunch  75 tablet  5  . colesevelam (WELCHOL) 625 MG tablet Take 1,250 mg by mouth 3 (three) times daily.       No facility-administered medications prior to visit.  **Not taking welchol as listed above  Review of Systems  Constitutional: Negative for fever, chills, appetite change and fatigue.  HENT: Negative for ear pain, congestion, sore throat, neck stiffness and dental problem.   Eyes: Negative for discharge, redness and visual disturbance.  Respiratory: Negative for cough, chest tightness, shortness of breath and wheezing.   Cardiovascular: Negative for chest pain, palpitations and leg swelling.  Gastrointestinal: Negative for nausea, vomiting, abdominal pain, diarrhea and blood in stool.  Endocrine: Negative for cold intolerance, heat intolerance, polydipsia, polyphagia and polyuria.  Genitourinary: Negative for dysuria, urgency, frequency, hematuria, flank pain and difficulty urinating.  Musculoskeletal: Positive for arthralgias (chronic right hip pain) and gait problem (due to right hip problem). Negative for myalgias, back pain and joint swelling.  Skin: Negative for pallor and rash.  Allergic/Immunologic: Negative for immunocompromised state.  Neurological: Negative for dizziness, tremors, speech difficulty, weakness and headaches.  Hematological: Negative for adenopathy. Does not bruise/bleed easily.   Psychiatric/Behavioral: Negative for confusion, sleep disturbance and dysphoric mood. The patient is not nervous/anxious.       PE: Blood pressure 147/69, pulse 76, temperature 98.3 F (36.8 C), temperature source Oral, resp. rate 16, height _0  (1.803 m), weight 196 lb (88.905 kg), SpO2 94.00%. Gen: Alert, well appearing.  Patient is oriented to person, place, time, and situation. ENT: Eyes: no injection, icterus, swelling, or exudate.  EOMI, PERRLA. Nose: no drainage or turbinate edema/swelling.  No injection or focal lesion.  Mouth: lips without lesion/swelling.  Oral mucosa pink and moist.    Oropharynx without erythema, exudate, or swelling.  Neck: supple/nontender.  No LAD, mass, or TM.  Carotid pulses 2+  bilaterally, without bruits. CV: RRR, no m/r/g.  Occasional ectopic beat is heard. LUNGS: CTA bilat, nonlabored resps, good aeration in all lung fields. ABD: soft, NT, ND, BS normal.  No hepatospenomegaly or mass.  No bruits. EXT: no clubbing or cyanosis.  He has 1+ bilat pretibial and ankle/foot edema, with minimal skin changes c/w chronic venous stasis. SKIN: no pallor or jaundice  LAB:  12 lead EKG today: NSR, several ectopic ventricular beats but none consecutive.   No acute ischemic changes.  No LVH.   V1 and V3 with minimal, if any R wave, but lead V2 QRS complex is normal. No old EKG available for comparison.  PA/LAT CXR in office today:  IMPRESSION (per radiologist): Hyperinflation configuration.  There are areas of increased reticular markings throughout the lungs which have increased since previous study particularly in the right perihilar region and in the right lung base. This may reflect progression of chronic pulmonary fibrotic process or interstitial pneumonitis. There is also central peribronchial thickening. If further imaging evaluation is felt clinically indicated, CT of the chest would be suggested.  Stable appearance of small nodular density in  right lung base felt to be most likely calcified granuloma. No evidence of active granulomatous process is seen. (this is compared to CXR 2009).    Chemistry      Component Value Date/Time   NA 139 03/18/2012 0947   K 4.8 03/18/2012 0947   CL 104 03/18/2012 0947   CO2 26 03/18/2012 0947   BUN 19 03/18/2012 0947   CREATININE 0.8 03/18/2012 0947      Component Value Date/Time   CALCIUM 9.5 03/18/2012 0947   ALKPHOS 57 03/18/2012 0947   AST 21 03/18/2012 0947   ALT 15 03/18/2012 0947   BILITOT 1.0 03/18/2012 0947     IMPRESSION AND PLAN:  Preoperative clearance He is medically cleared for surgery. I think his EKG findings are low risk, esp given his lack of any cardiac symptoms and good physical stamina from a CV/pulm standpoint. I recommended he completely stop smoking. I recommended he stop ASA 3-5 days prior to his surgery. His CXR showed some chronic abnormalities that should have no bearing on his cardiopulmonary risk for this surgery--he is asymptomatic.  I'll discuss f/u of these findings with patient: considering CT chest in future vs pulmonary referral or both.  Iron deficiency anemia This has resolved. 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   Type II or unspecified type diabetes mellitus without mention of complication, uncontrolled Pt says he is doing the best he can with his diet and activity. If HbA1c recheck today is increased compared to last check then we'll d/c amaryl and start victoza (pt said insurance formulary has this, but not byetta--which he was on in the past).  We reviewed the possible option of adding the newest antidiabetic pill [name] but decided to have this as a possible back-up option---he is higher risk for complications on this med given his age and being on so many other oral antidiabetic agents. For now, though, he'll continue all current diabetic meds at current doses except I'd like him to  decrease his metformin to 1000 mg bid instead of 2567m per day. Pt is due for urine microalb/cr so will check this today.  Tobacco dependence Encouraged complete cessation.   An After Visit Summary was printed and given to the patient.  FOLLOW UP: 460moADDENDUM 07/07/12:  Lab results  from today:   Lab Results  Component Value Date   WBC 7.9 07/07/2012   HGB 14.8 07/07/2012   HCT 44.3 07/07/2012   MCV 88.3 07/07/2012   PLT 155.0 07/07/2012   Lab Results  Component Value Date   HGBA1C 7.7* 07/07/2012

## 2012-07-07 NOTE — Assessment & Plan Note (Addendum)
He is medically cleared for surgery. I think his EKG findings are low risk, esp given his lack of any cardiac symptoms and good physical stamina from a CV/pulm standpoint. I recommended he completely stop smoking. I recommended he stop ASA 3-5 days prior to his surgery. His CXR showed some chronic abnormalities that should have no bearing on his cardiopulmonary risk for this surgery--he is asymptomatic.  I'll discuss f/u of these findings with patient: considering CT chest in future vs pulmonary referral or both.

## 2012-07-08 LAB — MICROALBUMIN / CREATININE URINE RATIO
Creatinine, Urine: 100.7 mg/dL
Microalb Creat Ratio: 20.4 mg/g (ref 0.0–30.0)
Microalb, Ur: 2.05 mg/dL — ABNORMAL HIGH (ref 0.00–1.89)

## 2012-07-10 ENCOUNTER — Encounter: Payer: Self-pay | Admitting: Family Medicine

## 2012-07-14 ENCOUNTER — Other Ambulatory Visit: Payer: Self-pay

## 2012-07-20 ENCOUNTER — Ambulatory Visit: Payer: PRIVATE HEALTH INSURANCE | Admitting: Family Medicine

## 2012-08-02 ENCOUNTER — Encounter (HOSPITAL_COMMUNITY): Payer: Self-pay | Admitting: Pharmacy Technician

## 2012-08-02 NOTE — Progress Notes (Signed)
Surgery scheduled for 08/12/12.  Need orders in EPIC.  Thank You.

## 2012-08-03 ENCOUNTER — Other Ambulatory Visit (HOSPITAL_COMMUNITY): Payer: Self-pay | Admitting: Orthopaedic Surgery

## 2012-08-04 ENCOUNTER — Encounter (HOSPITAL_COMMUNITY): Payer: Self-pay

## 2012-08-04 ENCOUNTER — Encounter (HOSPITAL_COMMUNITY)
Admission: RE | Admit: 2012-08-04 | Discharge: 2012-08-04 | Disposition: A | Discharge status: Home / Self Care | Payer: Medicare Other | Source: Ambulatory Visit | Attending: Orthopaedic Surgery | Admitting: Orthopaedic Surgery

## 2012-08-04 DIAGNOSIS — Z01812 Encounter for preprocedural laboratory examination: Secondary | ICD-10-CM | POA: Insufficient documentation

## 2012-08-04 DIAGNOSIS — Z0183 Encounter for blood typing: Secondary | ICD-10-CM | POA: Insufficient documentation

## 2012-08-04 LAB — URINALYSIS, ROUTINE W REFLEX MICROSCOPIC
Bilirubin Urine: NEGATIVE
Hgb urine dipstick: NEGATIVE
Nitrite: NEGATIVE
Specific Gravity, Urine: 1.02 (ref 1.005–1.030)
pH: 5.5 (ref 5.0–8.0)

## 2012-08-04 LAB — BASIC METABOLIC PANEL
CO2: 29 mEq/L (ref 19–32)
Chloride: 102 mEq/L (ref 96–112)
Potassium: 4.5 mEq/L (ref 3.5–5.1)
Sodium: 140 mEq/L (ref 135–145)

## 2012-08-04 LAB — CBC
HCT: 43 % (ref 39.0–52.0)
MCV: 88.1 fL (ref 78.0–100.0)
RBC: 4.88 MIL/uL (ref 4.22–5.81)
WBC: 6.7 10*3/uL (ref 4.0–10.5)

## 2012-08-04 LAB — SURGICAL PCR SCREEN: MRSA, PCR: NEGATIVE

## 2012-08-04 LAB — ABO/RH: ABO/RH(D): AB POS

## 2012-08-04 NOTE — Patient Instructions (Signed)
YOUR SURGERY IS SCHEDULED AT [facility]  ON:  Friday  8/8  REPORT TO [place] AT:  5:30 AM      PHONE # FOR SHORT STAY IS [phone]  DO NOT EAT OR DRINK ANYTHING AFTER MIDNIGHT THE NIGHT BEFORE YOUR SURGERY.  YOU MAY BRUSH YOUR TEETH, RINSE OUT YOUR MOUTH--BUT NO WATER, NO FOOD, NO CHEWING GUM, NO MINTS, NO CANDIES, NO CHEWING TOBACCO.  PLEASE TAKE THE FOLLOWING MEDICATIONS THE AM OF YOUR SURGERY WITH A FEW SIPS OF WATER:  LOVASTATIN    IF YOU ARE DIABETIC:  DO NOT TAKE ANY DIABETIC MEDICATIONS THE AM OF YOUR SURGERY.  IF YOU TAKE INSULIN IN THE EVENINGS--PLEASE ONLY TAKE 1/2 NORMAL EVENING DOSE THE NIGHT BEFORE YOUR SURGERY.  NO INSULIN THE AM OF YOUR SURGERY.    DO NOT BRING VALUABLES, MONEY, CREDIT CARDS.  DO NOT WEAR JEWELRY, MAKE-UP, NAIL POLISH AND NO METAL PINS OR CLIPS IN YOUR HAIR. CONTACT LENS, DENTURES / PARTIALS, GLASSES SHOULD NOT BE WORN TO SURGERY AND IN MOST CASES-HEARING AIDS WILL NEED TO BE REMOVED.  BRING YOUR GLASSES CASE, ANY EQUIPMENT NEEDED FOR YOUR CONTACT LENS. FOR PATIENTS ADMITTED TO THE HOSPITAL--CHECK OUT TIME THE DAY OF DISCHARGE IS 11:00 AM.  ALL INPATIENT ROOMS ARE PRIVATE - WITH BATHROOM, TELEPHONE, TELEVISION AND WIFI INTERNET.                               PLEASE READ OVER ANY  FACT SHEETS THAT YOU WERE GIVEN: MRSA INFORMATION, BLOOD TRANSFUSION INFORMATION FAILURE TO FOLLOW THESE INSTRUCTIONS MAY RESULT IN THE CANCELLATION OF YOUR SURGERY.   PATIENT SIGNATURE_________________________________

## 2012-08-04 NOTE — Pre-Procedure Instructions (Signed)
PT'S EKG, CXR AND MEDICAL CLEARANCE FOR RT THA SURGERY IN EPIC - DR. Anitra Lauth.

## 2012-08-11 NOTE — Anesthesia Preprocedure Evaluation (Addendum)
Anesthesia Evaluation  Patient identified by MRN, date of birth, ID band Patient awake    Reviewed: Allergy & Precautions, H&P , NPO status , Patient's Chart, lab work & pertinent test results  Airway Mallampati: II TM Distance: >3 FB Neck ROM: Full    Dental  (+) Edentulous Upper and Edentulous Lower   Pulmonary shortness of breath and with exertion, COPDCurrent Smoker,  breath sounds clear to auscultation  Pulmonary exam normal       Cardiovascular hypertension, Pt. on medications Rhythm:Regular Rate:Normal     Neuro/Psych negative neurological ROS     GI/Hepatic negative GI ROS, Neg liver ROS,   Endo/Other  diabetes, Type 2, Oral Hypoglycemic Agents  Renal/GU Renal InsufficiencyRenal disease  negative genitourinary   Musculoskeletal negative musculoskeletal ROS (+)   Abdominal   Peds  Hematology negative hematology ROS (+)   Anesthesia Other Findings   Reproductive/Obstetrics                          Anesthesia Physical Anesthesia Plan  ASA: III  Anesthesia Plan: General   Post-op Pain Management:    Induction: Intravenous  Airway Management Planned: Oral ETT  Additional Equipment:   Intra-op Plan:   Post-operative Plan: Extubation in OR  Informed Consent: I have reviewed the patients History and Physical, chart, labs and discussed the procedure including the risks, benefits and alternatives for the proposed anesthesia with the patient or authorized representative who has indicated his/her understanding and acceptance.   Dental advisory given  Plan Discussed with: CRNA  Anesthesia Plan Comments:         Anesthesia Quick Evaluation

## 2012-08-12 ENCOUNTER — Other Ambulatory Visit: Payer: Self-pay | Admitting: Family Medicine

## 2012-08-12 ENCOUNTER — Encounter (HOSPITAL_COMMUNITY): Payer: Self-pay | Admitting: Anesthesiology

## 2012-08-12 ENCOUNTER — Inpatient Hospital Stay (HOSPITAL_COMMUNITY): Payer: Medicare Other

## 2012-08-12 ENCOUNTER — Encounter (HOSPITAL_COMMUNITY): Payer: Self-pay | Admitting: *Deleted

## 2012-08-12 ENCOUNTER — Inpatient Hospital Stay (HOSPITAL_COMMUNITY)
Admission: RE | Admit: 2012-08-12 | Discharge: 2012-08-17 | DRG: 470 | Disposition: A | Discharge status: Skilled Nursing Facility | Payer: Medicare Other | Source: Ambulatory Visit | Attending: Orthopaedic Surgery | Admitting: Orthopaedic Surgery

## 2012-08-12 ENCOUNTER — Inpatient Hospital Stay (HOSPITAL_COMMUNITY): Payer: Medicare Other | Admitting: Anesthesiology

## 2012-08-12 ENCOUNTER — Encounter (HOSPITAL_COMMUNITY)
Admission: RE | Disposition: A | Discharge status: Skilled Nursing Facility | Payer: Self-pay | Source: Ambulatory Visit | Attending: Orthopaedic Surgery

## 2012-08-12 ENCOUNTER — Telehealth: Payer: Self-pay | Admitting: Family Medicine

## 2012-08-12 DIAGNOSIS — E785 Hyperlipidemia, unspecified: Secondary | ICD-10-CM | POA: Diagnosis present

## 2012-08-12 DIAGNOSIS — Z79899 Other long term (current) drug therapy: Secondary | ICD-10-CM

## 2012-08-12 DIAGNOSIS — N181 Chronic kidney disease, stage 1: Secondary | ICD-10-CM | POA: Diagnosis present

## 2012-08-12 DIAGNOSIS — K2940 Chronic atrophic gastritis without bleeding: Secondary | ICD-10-CM | POA: Diagnosis present

## 2012-08-12 DIAGNOSIS — E669 Obesity, unspecified: Secondary | ICD-10-CM | POA: Diagnosis present

## 2012-08-12 DIAGNOSIS — Z6830 Body mass index (BMI) 30.0-30.9, adult: Secondary | ICD-10-CM

## 2012-08-12 DIAGNOSIS — I129 Hypertensive chronic kidney disease with stage 1 through stage 4 chronic kidney disease, or unspecified chronic kidney disease: Secondary | ICD-10-CM | POA: Diagnosis present

## 2012-08-12 DIAGNOSIS — Z7982 Long term (current) use of aspirin: Secondary | ICD-10-CM

## 2012-08-12 DIAGNOSIS — J4489 Other specified chronic obstructive pulmonary disease: Secondary | ICD-10-CM | POA: Diagnosis present

## 2012-08-12 DIAGNOSIS — Z472 Encounter for removal of internal fixation device: Secondary | ICD-10-CM

## 2012-08-12 DIAGNOSIS — R197 Diarrhea, unspecified: Secondary | ICD-10-CM | POA: Diagnosis not present

## 2012-08-12 DIAGNOSIS — E1129 Type 2 diabetes mellitus with other diabetic kidney complication: Secondary | ICD-10-CM | POA: Diagnosis present

## 2012-08-12 DIAGNOSIS — J449 Chronic obstructive pulmonary disease, unspecified: Secondary | ICD-10-CM | POA: Diagnosis present

## 2012-08-12 DIAGNOSIS — D62 Acute posthemorrhagic anemia: Secondary | ICD-10-CM | POA: Diagnosis not present

## 2012-08-12 DIAGNOSIS — H3530 Unspecified macular degeneration: Secondary | ICD-10-CM | POA: Diagnosis present

## 2012-08-12 DIAGNOSIS — N058 Unspecified nephritic syndrome with other morphologic changes: Secondary | ICD-10-CM | POA: Diagnosis present

## 2012-08-12 DIAGNOSIS — M948X9 Other specified disorders of cartilage, unspecified sites: Secondary | ICD-10-CM | POA: Diagnosis present

## 2012-08-12 DIAGNOSIS — Z01812 Encounter for preprocedural laboratory examination: Secondary | ICD-10-CM

## 2012-08-12 DIAGNOSIS — M1610 Unilateral primary osteoarthritis, unspecified hip: Principal | ICD-10-CM | POA: Diagnosis present

## 2012-08-12 DIAGNOSIS — M169 Osteoarthritis of hip, unspecified: Secondary | ICD-10-CM

## 2012-08-12 DIAGNOSIS — F17200 Nicotine dependence, unspecified, uncomplicated: Secondary | ICD-10-CM | POA: Diagnosis present

## 2012-08-12 DIAGNOSIS — N529 Male erectile dysfunction, unspecified: Secondary | ICD-10-CM | POA: Diagnosis present

## 2012-08-12 HISTORY — PX: TOTAL HIP ARTHROPLASTY: SHX124

## 2012-08-12 HISTORY — PX: HARDWARE REMOVAL: SHX979

## 2012-08-12 LAB — TYPE AND SCREEN

## 2012-08-12 LAB — GLUCOSE, CAPILLARY
Glucose-Capillary: 148 mg/dL — ABNORMAL HIGH (ref 70–99)
Glucose-Capillary: 242 mg/dL — ABNORMAL HIGH (ref 70–99)

## 2012-08-12 SURGERY — ARTHROPLASTY, HIP, TOTAL, ANTERIOR APPROACH
Anesthesia: General | Site: Hip | Laterality: Right | Wound class: Clean

## 2012-08-12 MED ORDER — METOCLOPRAMIDE HCL 10 MG PO TABS
5.0000 mg | ORAL_TABLET | Freq: Three times a day (TID) | ORAL | Status: DC | PRN
Start: 2012-08-12 — End: 2012-08-17

## 2012-08-12 MED ORDER — ONDANSETRON HCL 4 MG PO TABS: 4.0000 mg | ORAL_TABLET | Freq: Four times a day (QID) | ORAL | Status: DC | PRN

## 2012-08-12 MED ORDER — EPHEDRINE SULFATE 50 MG/ML IJ SOLN
INTRAMUSCULAR | Status: DC | PRN
  Administered 2012-08-12: 5 mg via INTRAVENOUS
  Administered 2012-08-12: 10 mg via INTRAVENOUS

## 2012-08-12 MED ORDER — TRAMADOL HCL 50 MG PO TABS: 100.0000 mg | ORAL_TABLET | Freq: Four times a day (QID) | ORAL | Status: DC | PRN

## 2012-08-12 MED ORDER — SAXAGLIPTIN HCL 5 MG PO TABS: 5.0000 mg | ORAL_TABLET | Freq: Every day | ORAL | Status: DC

## 2012-08-12 MED ORDER — PHENOL 1.4 % MT LIQD: 1.0000 | OROMUCOSAL | Status: DC | PRN

## 2012-08-12 MED ORDER — METOCLOPRAMIDE HCL 5 MG/ML IJ SOLN: 5.0000 mg | Freq: Three times a day (TID) | INTRAMUSCULAR | Status: DC | PRN

## 2012-08-12 MED ORDER — ROCURONIUM BROMIDE 100 MG/10ML IV SOLN
INTRAVENOUS | Status: DC | PRN
  Administered 2012-08-12: 50 mg via INTRAVENOUS
  Administered 2012-08-12: 20 mg via INTRAVENOUS
  Administered 2012-08-12: 10 mg via INTRAVENOUS

## 2012-08-12 MED ORDER — OXYCODONE HCL 5 MG PO TABS
5.0000 mg | ORAL_TABLET | ORAL | Status: DC | PRN
  Administered 2012-08-12 – 2012-08-17 (×20): 10 mg via ORAL
  Filled 2012-08-12 (×20): qty 2

## 2012-08-12 MED ORDER — PHENYLEPHRINE HCL 10 MG/ML IJ SOLN
INTRAMUSCULAR | Status: DC | PRN
  Administered 2012-08-12 (×2): 40 ug via INTRAVENOUS

## 2012-08-12 MED ORDER — DIPHENHYDRAMINE HCL 12.5 MG/5ML PO ELIX: 12.5000 mg | ORAL_SOLUTION | ORAL | Status: DC | PRN

## 2012-08-12 MED ORDER — ACETAMINOPHEN 650 MG RE SUPP: 650.0000 mg | Freq: Four times a day (QID) | RECTAL | Status: DC | PRN

## 2012-08-12 MED ORDER — RAMIPRIL 10 MG PO CAPS
10.0000 mg | ORAL_CAPSULE | Freq: Two times a day (BID) | ORAL | Status: DC
  Administered 2012-08-12 – 2012-08-17 (×8): 10 mg via ORAL
  Filled 2012-08-12 (×11): qty 1

## 2012-08-12 MED ORDER — METHOCARBAMOL 100 MG/ML IJ SOLN
500.0000 mg | Freq: Four times a day (QID) | INTRAVENOUS | Status: DC | PRN
  Filled 2012-08-12: qty 5

## 2012-08-12 MED ORDER — LACTATED RINGERS IV SOLN
INTRAVENOUS | Status: DC | PRN
  Administered 2012-08-12: 07:00:00 via INTRAVENOUS

## 2012-08-12 MED ORDER — HYDROMORPHONE HCL PF 1 MG/ML IJ SOLN
1.0000 mg | INTRAMUSCULAR | Status: DC | PRN
  Administered 2012-08-12 – 2012-08-13 (×5): 1 mg via INTRAVENOUS
  Filled 2012-08-12 (×5): qty 1

## 2012-08-12 MED ORDER — POLYETHYLENE GLYCOL 3350 17 G PO PACK: 17.0000 g | PACK | Freq: Every day | ORAL | Status: DC | PRN

## 2012-08-12 MED ORDER — CEFAZOLIN SODIUM-DEXTROSE 2-3 GM-% IV SOLR
2.0000 g | INTRAVENOUS | Status: AC
  Administered 2012-08-12: 2 g via INTRAVENOUS

## 2012-08-12 MED ORDER — MENTHOL 3 MG MT LOZG: 1.0000 | LOZENGE | OROMUCOSAL | Status: DC | PRN

## 2012-08-12 MED ORDER — DOCUSATE SODIUM 100 MG PO CAPS
100.0000 mg | ORAL_CAPSULE | Freq: Two times a day (BID) | ORAL | Status: DC
  Administered 2012-08-12 – 2012-08-13 (×3): 100 mg via ORAL

## 2012-08-12 MED ORDER — PIOGLITAZONE HCL 45 MG PO TABS: 45.0000 mg | ORAL_TABLET | Freq: Every day | ORAL | Status: DC

## 2012-08-12 MED ORDER — HYDROMORPHONE HCL PF 1 MG/ML IJ SOLN
INTRAMUSCULAR | Status: DC | PRN
  Administered 2012-08-12: 0.5 mg via INTRAVENOUS

## 2012-08-12 MED ORDER — ZOLPIDEM TARTRATE 5 MG PO TABS: 5.0000 mg | ORAL_TABLET | Freq: Every evening | ORAL | Status: DC | PRN

## 2012-08-12 MED ORDER — FERROUS SULFATE 325 (65 FE) MG PO TABS
325.0000 mg | ORAL_TABLET | Freq: Every day | ORAL | Status: DC
  Administered 2012-08-13 – 2012-08-17 (×4): 325 mg via ORAL
  Filled 2012-08-12 (×6): qty 1

## 2012-08-12 MED ORDER — ACETAMINOPHEN 325 MG PO TABS: 650.0000 mg | ORAL_TABLET | Freq: Four times a day (QID) | ORAL | Status: DC | PRN

## 2012-08-12 MED ORDER — LACTATED RINGERS IV SOLN
INTRAVENOUS | Status: DC
  Administered 2012-08-12: 11:00:00 via INTRAVENOUS

## 2012-08-12 MED ORDER — ONDANSETRON HCL 4 MG/2ML IJ SOLN
INTRAMUSCULAR | Status: DC | PRN
  Administered 2012-08-12: 4 mg via INTRAVENOUS

## 2012-08-12 MED ORDER — INSULIN ASPART 100 UNIT/ML ~~LOC~~ SOLN
0.0000 [IU] | Freq: Three times a day (TID) | SUBCUTANEOUS | Status: DC
  Administered 2012-08-12: 8 [IU] via SUBCUTANEOUS
  Administered 2012-08-13: 3 [IU] via SUBCUTANEOUS
  Administered 2012-08-13: 8 [IU] via SUBCUTANEOUS
  Administered 2012-08-13: 3 [IU] via SUBCUTANEOUS
  Administered 2012-08-14: 5 [IU] via SUBCUTANEOUS
  Administered 2012-08-14: 8 [IU] via SUBCUTANEOUS
  Administered 2012-08-15 – 2012-08-16 (×3): 5 [IU] via SUBCUTANEOUS
  Administered 2012-08-16: 3 [IU] via SUBCUTANEOUS
  Administered 2012-08-16: 8 [IU] via SUBCUTANEOUS
  Administered 2012-08-17 (×2): 3 [IU] via SUBCUTANEOUS

## 2012-08-12 MED ORDER — PIOGLITAZONE HCL 45 MG PO TABS
45.0000 mg | ORAL_TABLET | Freq: Every day | ORAL | Status: DC
  Administered 2012-08-12 – 2012-08-17 (×6): 45 mg via ORAL
  Filled 2012-08-12 (×6): qty 1

## 2012-08-12 MED ORDER — 0.9 % SODIUM CHLORIDE (POUR BTL) OPTIME
TOPICAL | Status: DC | PRN
  Administered 2012-08-12: 1000 mL

## 2012-08-12 MED ORDER — BISACODYL 5 MG PO TBEC: 5.0000 mg | DELAYED_RELEASE_TABLET | Freq: Every day | ORAL | Status: DC | PRN

## 2012-08-12 MED ORDER — ADULT MULTIVITAMIN W/MINERALS CH
1.0000 | ORAL_TABLET | Freq: Every day | ORAL | Status: DC
  Administered 2012-08-13 – 2012-08-17 (×5): 1 via ORAL
  Filled 2012-08-12 (×5): qty 1

## 2012-08-12 MED ORDER — PROMETHAZINE HCL 25 MG/ML IJ SOLN: 6.2500 mg | INTRAMUSCULAR | Status: DC | PRN

## 2012-08-12 MED ORDER — MIDAZOLAM HCL 5 MG/5ML IJ SOLN
INTRAMUSCULAR | Status: DC | PRN
  Administered 2012-08-12: 2 mg via INTRAVENOUS

## 2012-08-12 MED ORDER — INSULIN ASPART 100 UNIT/ML ~~LOC~~ SOLN
0.0000 [IU] | Freq: Every day | SUBCUTANEOUS | Status: DC
  Administered 2012-08-12 – 2012-08-16 (×3): 2 [IU] via SUBCUTANEOUS

## 2012-08-12 MED ORDER — SIMVASTATIN 20 MG PO TABS
20.0000 mg | ORAL_TABLET | Freq: Every day | ORAL | Status: DC
  Administered 2012-08-12 – 2012-08-16 (×5): 20 mg via ORAL
  Filled 2012-08-12 (×6): qty 1

## 2012-08-12 MED ORDER — METFORMIN HCL 500 MG PO TABS
1000.0000 mg | ORAL_TABLET | Freq: Two times a day (BID) | ORAL | Status: DC
  Administered 2012-08-12 – 2012-08-17 (×10): 1000 mg via ORAL
  Filled 2012-08-12 (×12): qty 2

## 2012-08-12 MED ORDER — SODIUM CHLORIDE 0.9 % IR SOLN
Status: DC | PRN
  Administered 2012-08-12: 3000 mL

## 2012-08-12 MED ORDER — STERILE WATER FOR IRRIGATION IR SOLN
Status: DC | PRN
  Administered 2012-08-12: 3000 mL

## 2012-08-12 MED ORDER — GLIMEPIRIDE 4 MG PO TABS
4.0000 mg | ORAL_TABLET | Freq: Two times a day (BID) | ORAL | Status: DC
  Administered 2012-08-12 – 2012-08-17 (×10): 4 mg via ORAL
  Filled 2012-08-12 (×12): qty 1

## 2012-08-12 MED ORDER — ALUM & MAG HYDROXIDE-SIMETH 200-200-20 MG/5ML PO SUSP: 30.0000 mL | ORAL | Status: DC | PRN

## 2012-08-12 MED ORDER — CHLORTHALIDONE 25 MG PO TABS
25.0000 mg | ORAL_TABLET | Freq: Every day | ORAL | Status: DC
  Administered 2012-08-12 – 2012-08-17 (×5): 25 mg via ORAL
  Filled 2012-08-12 (×6): qty 1

## 2012-08-12 MED ORDER — GLYCOPYRROLATE 0.2 MG/ML IJ SOLN
INTRAMUSCULAR | Status: DC | PRN
  Administered 2012-08-12: .8 mg via INTRAVENOUS

## 2012-08-12 MED ORDER — ALBUTEROL SULFATE (5 MG/ML) 0.5% IN NEBU
2.5000 mg | INHALATION_SOLUTION | Freq: Once | RESPIRATORY_TRACT | Status: AC
  Administered 2012-08-12: 2.5 mg via RESPIRATORY_TRACT

## 2012-08-12 MED ORDER — ONDANSETRON HCL 4 MG/2ML IJ SOLN
4.0000 mg | Freq: Four times a day (QID) | INTRAMUSCULAR | Status: DC | PRN
  Administered 2012-08-13: 4 mg via INTRAVENOUS
  Filled 2012-08-12: qty 2

## 2012-08-12 MED ORDER — METHOCARBAMOL 500 MG PO TABS
500.0000 mg | ORAL_TABLET | Freq: Four times a day (QID) | ORAL | Status: DC | PRN
  Administered 2012-08-12 – 2012-08-17 (×8): 500 mg via ORAL
  Filled 2012-08-12 (×8): qty 1

## 2012-08-12 MED ORDER — LIDOCAINE HCL (CARDIAC) 20 MG/ML IV SOLN
INTRAVENOUS | Status: DC | PRN
  Administered 2012-08-12: 80 mg via INTRAVENOUS

## 2012-08-12 MED ORDER — RIVAROXABAN 10 MG PO TABS
10.0000 mg | ORAL_TABLET | Freq: Every day | ORAL | Status: DC
  Administered 2012-08-13 – 2012-08-16 (×4): 10 mg via ORAL
  Filled 2012-08-12 (×5): qty 1

## 2012-08-12 MED ORDER — CEFAZOLIN SODIUM 1-5 GM-% IV SOLN
1.0000 g | Freq: Four times a day (QID) | INTRAVENOUS | Status: AC
  Administered 2012-08-12 (×2): 1 g via INTRAVENOUS
  Filled 2012-08-12 (×2): qty 50

## 2012-08-12 MED ORDER — NEOSTIGMINE METHYLSULFATE 1 MG/ML IJ SOLN
INTRAMUSCULAR | Status: DC | PRN
  Administered 2012-08-12: 5 mg via INTRAVENOUS

## 2012-08-12 MED ORDER — FENTANYL CITRATE 0.05 MG/ML IJ SOLN
INTRAMUSCULAR | Status: DC | PRN
  Administered 2012-08-12: 50 ug via INTRAVENOUS
  Administered 2012-08-12: 100 ug via INTRAVENOUS
  Administered 2012-08-12 (×2): 50 ug via INTRAVENOUS

## 2012-08-12 MED ORDER — SODIUM CHLORIDE 0.9 % IV SOLN
INTRAVENOUS | Status: DC
  Administered 2012-08-12 – 2012-08-13 (×2): via INTRAVENOUS

## 2012-08-12 MED ORDER — HYDROMORPHONE HCL PF 1 MG/ML IJ SOLN
0.2500 mg | INTRAMUSCULAR | Status: DC | PRN
  Administered 2012-08-12 (×4): 0.5 mg via INTRAVENOUS

## 2012-08-12 MED ORDER — PROPOFOL 10 MG/ML IV BOLUS
INTRAVENOUS | Status: DC | PRN
  Administered 2012-08-12: 180 mg via INTRAVENOUS

## 2012-08-12 SURGICAL SUPPLY — 60 items
ADH SKN CLS APL DERMABOND .7 (GAUZE/BANDAGES/DRESSINGS)
BAG SPEC THK2 15X12 ZIP CLS (MISCELLANEOUS) ×2
BAG ZIPLOCK 12X15 (MISCELLANEOUS) ×4 IMPLANT
BANDAGE ELASTIC 6 VELCRO ST LF (GAUZE/BANDAGES/DRESSINGS) ×2 IMPLANT
BANDAGE ESMARK 6X9 LF (GAUZE/BANDAGES/DRESSINGS) ×1 IMPLANT
BLADE SAW SGTL 18X1.27X75 (BLADE) ×2 IMPLANT
BNDG CMPR 9X6 STRL LF SNTH (GAUZE/BANDAGES/DRESSINGS) ×1
BNDG ESMARK 6X9 LF (GAUZE/BANDAGES/DRESSINGS) ×2
CAPT HIP PF MOP ×1 IMPLANT
CELLS DAT CNTRL 66122 CELL SVR (MISCELLANEOUS) ×1 IMPLANT
CLOTH BEACON ORANGE TIMEOUT ST (SAFETY) ×2 IMPLANT
CUFF TOURN SGL QUICK 34 (TOURNIQUET CUFF)
CUFF TRNQT CYL 34X4X40X1 (TOURNIQUET CUFF) ×1 IMPLANT
DERMABOND ADVANCED (GAUZE/BANDAGES/DRESSINGS)
DERMABOND ADVANCED .7 DNX12 (GAUZE/BANDAGES/DRESSINGS) ×1 IMPLANT
DRAPE C-ARM 42X120 X-RAY (DRAPES) ×2 IMPLANT
DRAPE POUCH INSTRU U-SHP 10X18 (DRAPES) ×2 IMPLANT
DRAPE STERI IOBAN 125X83 (DRAPES) ×2 IMPLANT
DRAPE U-SHAPE 47X51 STRL (DRAPES) ×6 IMPLANT
DRSG AQUACEL AG ADV 3.5X 6 (GAUZE/BANDAGES/DRESSINGS) ×1 IMPLANT
DRSG AQUACEL AG ADV 3.5X10 (GAUZE/BANDAGES/DRESSINGS) ×2 IMPLANT
DRSG PAD ABDOMINAL 8X10 ST (GAUZE/BANDAGES/DRESSINGS) ×1 IMPLANT
DURAPREP 26ML APPLICATOR (WOUND CARE) ×2 IMPLANT
ELECT BLADE TIP CTD 4 INCH (ELECTRODE) ×2 IMPLANT
ELECT REM PT RETURN 9FT ADLT (ELECTROSURGICAL) ×2
ELECTRODE REM PT RTRN 9FT ADLT (ELECTROSURGICAL) ×1 IMPLANT
FACESHIELD LNG OPTICON STERILE (SAFETY) ×8 IMPLANT
GAUZE XEROFORM 1X8 LF (GAUZE/BANDAGES/DRESSINGS) ×2 IMPLANT
GLOVE BIO SURGEON STRL SZ7.5 (GLOVE) ×2 IMPLANT
GLOVE BIOGEL PI IND STRL 8 (GLOVE) ×2 IMPLANT
GLOVE BIOGEL PI INDICATOR 8 (GLOVE) ×2
GLOVE ECLIPSE 8.0 STRL XLNG CF (GLOVE) ×2 IMPLANT
GOWN STRL REIN XL XLG (GOWN DISPOSABLE) ×4 IMPLANT
HANDPIECE INTERPULSE COAX TIP (DISPOSABLE) ×2
KIT BASIN OR (CUSTOM PROCEDURE TRAY) ×2 IMPLANT
NS IRRIG 1000ML POUR BTL (IV SOLUTION) ×2 IMPLANT
PACK TOTAL JOINT (CUSTOM PROCEDURE TRAY) ×2 IMPLANT
PADDING CAST COTTON 6X4 STRL (CAST SUPPLIES) ×2 IMPLANT
POSITIONER SURGICAL ARM (MISCELLANEOUS) ×2 IMPLANT
RETRACTOR WND ALEXIS 18 MED (MISCELLANEOUS) ×1 IMPLANT
RTRCTR WOUND ALEXIS 18CM MED (MISCELLANEOUS) ×2
SET HNDPC FAN SPRY TIP SCT (DISPOSABLE) ×1 IMPLANT
SPONGE GAUZE 4X4 12PLY (GAUZE/BANDAGES/DRESSINGS) ×1 IMPLANT
SPONGE LAP 18X18 X RAY DECT (DISPOSABLE) ×1 IMPLANT
STAPLER VISISTAT 35W (STAPLE) IMPLANT
STRIP CLOSURE SKIN 1/2X4 (GAUZE/BANDAGES/DRESSINGS) IMPLANT
SUT ETHIBOND NAB CT1 #1 30IN (SUTURE) ×3 IMPLANT
SUT ETHILON 3 0 PS 1 (SUTURE) ×1 IMPLANT
SUT MNCRL AB 4-0 PS2 18 (SUTURE) ×2 IMPLANT
SUT VIC AB 0 CT1 36 (SUTURE) ×2 IMPLANT
SUT VIC AB 1 CT1 27 (SUTURE) ×2
SUT VIC AB 1 CT1 27XBRD ANTBC (SUTURE) ×1 IMPLANT
SUT VIC AB 1 CT1 36 (SUTURE) ×5 IMPLANT
SUT VIC AB 2-0 CT1 27 (SUTURE) ×6
SUT VIC AB 2-0 CT1 TAPERPNT 27 (SUTURE) ×2 IMPLANT
TOWEL OR 17X26 10 PK STRL BLUE (TOWEL DISPOSABLE) ×4 IMPLANT
TOWEL OR NON WOVEN STRL DISP B (DISPOSABLE) ×2 IMPLANT
TRAY FOLEY CATH 14FRSI W/METER (CATHETERS) ×1 IMPLANT
TRAY FOLEY CATH 16FR SILVER (SET/KITS/TRAYS/PACK) ×1 IMPLANT
WATER STERILE IRR 1500ML POUR (IV SOLUTION) ×2 IMPLANT

## 2012-08-12 NOTE — Brief Op Note (Signed)
08/12/2012  10:14 AM  PATIENT:  Joshua [name]  72 y.o. 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  PROCEDURE:  Procedure(s): REMOVAL OF OLD PINS RIGHT HIP AND RIGHT TOTAL HIP ARTHROPLASTY ANTERIOR APPROACH (Right) HARDWARE REMOVAL (Right)  SURGEON:  Surgeon(s) and Role:    * Mcarthur Rossetti, MD - Primary  PHYSICIAN ASSISTANT: Benita Stabile, PA-C  ANESTHESIA:   general  EBL:  Total I/O In: 2000 [I.V.:2000] Out: 641 [Urine:300; Blood:475]  BLOOD ADMINISTERED:none  DRAINS: none   LOCAL MEDICATIONS USED:  NONE  SPECIMEN:  No Specimen  DISPOSITION OF SPECIMEN:  N/A  COUNTS:  YES  TOURNIQUET:  * No tourniquets in log *  DICTATION: .Other Dictation: Dictation Number [id number]  PLAN OF CARE: Admit to inpatient   PATIENT DISPOSITION:  PACU - hemodynamically stable.   Delay start of Pharmacological VTE agent (>24hrs) due to surgical blood loss or risk of bleeding: no

## 2012-08-12 NOTE — Progress Notes (Signed)
Utilization review completed.

## 2012-08-12 NOTE — Telephone Encounter (Signed)
It is on his outpatient med list, but that cannot be viewed on EPIC right now b/c he's in the hosp for ortho surgery. He is on onglyza 59m, 1 tab po qd, and he can have #30 with RF x 6.-thx

## 2012-08-12 NOTE — Preoperative (Signed)
Beta Blockers   Reason not to administer Beta Blockers:Not Applicable

## 2012-08-12 NOTE — Telephone Encounter (Signed)
Pharmacy faxed request for onglyza for patient.  It is no longer in his med list, does he need to continue this medication or no?  Please advise.

## 2012-08-12 NOTE — Progress Notes (Signed)
PT Cancellation Note  Patient Details Name: Joshua [name] MRN: [id number] DOB: [birth year]   Cancelled Treatment:    Reason Eval/Treat Not Completed: Other (comment) (Pt asks to start mobility in the am)   Norwood Levo 08/12/2012, 3:11 PM

## 2012-08-12 NOTE — Anesthesia Postprocedure Evaluation (Signed)
Anesthesia Post Note  Patient: Joshua [name]  Procedure(s) Performed: Procedure(s) (LRB): REMOVAL OF OLD PINS RIGHT HIP AND RIGHT TOTAL HIP ARTHROPLASTY ANTERIOR APPROACH (Right) HARDWARE REMOVAL (Right)  Anesthesia type: General  Patient location: PACU  Post pain: Pain level controlled  Post assessment: Post-op Vital signs reviewed  Last Vitals:  Filed Vitals:   08/12/12 1152  BP: 121/49  Pulse: 72  Temp: 36.6 C  Resp: 15    Post vital signs: Reviewed  Level of consciousness: sedated  Complications: No apparent anesthesia complications

## 2012-08-12 NOTE — Transfer of Care (Signed)
Immediate Anesthesia Transfer of Care Note  Patient: Joshua [name]  Procedure(s) Performed: Procedure(s): REMOVAL OF OLD PINS RIGHT HIP AND RIGHT TOTAL HIP ARTHROPLASTY ANTERIOR APPROACH (Right) HARDWARE REMOVAL (Right)  Patient Location: PACU  Anesthesia Type:General  Level of Consciousness: awake, alert , oriented and patient cooperative  Airway & Oxygen Therapy: Patient Spontanous Breathing and Patient connected to face mask oxygen  Post-op Assessment: Report given to PACU RN, Post -op Vital signs reviewed and stable and Patient moving all extremities  Post vital signs: Reviewed and stable  Complications: No apparent anesthesia complications

## 2012-08-12 NOTE — H&P (Signed)
TOTAL HIP ADMISSION H&P  Patient is admitted for right total hip arthroplasty.  Subjective:  Chief Complaint: right hip pain  HPI: Joshua [name], 72 y.o. male, has a history of pain and functional disability in the right hip(s) due to arthritis and patient has failed non-surgical conservative treatments for greater than 12 weeks to include NSAID's and/or analgesics, use of assistive devices and activity modification.  Onset of symptoms was gradual starting >10 years ago with gradually worsening course since that time.The patient noted prior procedures of the hip to include hardware placed at age13 due to a hip injury. on the right hip(s).  Patient currently rates pain in the right hip at 10 out of 10 with activity. Patient has worsening of pain with activity and weight bearing, trendelenberg gait, pain that interfers with activities of daily living, pain with passive range of motion and crepitus. Patient has evidence of subchondral sclerosis, joint space narrowing and retained hardware by imaging studies. This condition presents safety issues increasing the risk of falls.  There is no current active infection.  Patient Active Problem List   Diagnosis Date Noted  . Degenerative arthritis of hip 08/12/2012  . Preoperative clearance 07/07/2012  . Hyperlipidemia 03/16/2012  . Tobacco dependence   . Health maintenance examination 09/16/2011  . Iron deficiency anemia 09/02/2011  . Type II or unspecified type diabetes mellitus without mention of complication, uncontrolled 09/02/2011  . HTN (hypertension), benign 09/02/2011  . Prostate cancer screening 09/02/2011   Past Medical History  Diagnosis Date  . DM type 2 (diabetes mellitus, type 2)   . Hypertension   . Tobacco dependence     Prior failure to quit with wellbutrin.  100+ pack-yr history  . CKD (chronic kidney disease), stage I   . Hyperlipidemia   . Obesity   . Hyperplastic colon polyp 2001  . COPD (chronic obstructive pulmonary  disease)     Spirometry  2004 borderline obstruction  . Erectile dysfunction     Normal testosterone  . Diabetic nephropathy     Elevated urine microalb/cr 03/2011  . Shortness of breath     exertion  . Iron deficiency anemia 2014    03/2012 capsule endoscopy showed 2 AVMs--likely responsible for his IDA--lifetime iron supp recommended + q62moCBCs.  . Adenomatous colon polyp 10/16/2011    Repeat 2018  . 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 pylori neg, no dysplasia or malignancy.  . Macular degeneration, dry     Mild, bilat (Optometrist, [place]  . History of pneumonia     YRS AGO  . Arthritis      Hips, R>L & KNEES    Past Surgical History  Procedure Laterality Date  . Cholecystectomy open  1999  . Appendectomy  1957  . Hip surgery  age 72    Repairof slipped capital femoral epiphysis.  . Knee surgery  1979 or so    left; bolt + 3 screws to repair tib plateau fx  . Testicle surgery  as a child    Undescended testicle brought down into scrotum  . Tonsillectomy  1947  . Colonoscopy  10/16/2011    Procedure: COLONOSCOPY;  Surgeon: RInda Castle MD;  Location: [facility];  Service: Endoscopy;  Laterality: N/A;  . Cataract extraction w/ intraocular lens  implant, bilateral  04/08/2006 & 04/22/2006  . Esophagogastroduodenoscopy  02/25/12    Atrophic gastritis with a few erosions--capsule  endoscopy planned as of 02/25/12 (Dr. Deatra Ina).    Prescriptions prior to admission  Medication Sig Dispense Refill  . acetaminophen (TYLENOL) 500 MG tablet Take 1,000 mg by mouth 2 (two) times daily as needed for pain.       [name] aspirin 81 MG tablet Take 81 mg by mouth daily.      . chlorthalidone (HYGROTON) 25 MG tablet Take 25 mg by mouth daily.      . Cholecalciferol (VITAMIN D-3) 1000 UNITS CAPS Take 1 capsule by mouth daily.      . Chromium Picolinate 1000 MCG TABS Take 1 tablet by mouth daily.      .  ferrous sulfate 325 (65 FE) MG tablet Take 325 mg by mouth daily with breakfast.      . glimepiride (AMARYL) 4 MG tablet Take 4 mg by mouth 2 (two) times daily.      [name] lovastatin (MEVACOR) 40 MG tablet Take 40 mg by mouth 2 (two) times daily.      . metFORMIN (GLUCOPHAGE) 1000 MG tablet Take 1,000 mg by mouth 2 (two) times daily with a meal.      . Multiple Vitamin (MULTIVITAMIN WITH MINERALS) TABS Take 1 tablet by mouth daily.      . pioglitazone (ACTOS) 45 MG tablet Take 45 mg by mouth daily.      . ramipril (ALTACE) 10 MG capsule Take 10 mg by mouth 2 (two) times daily.      . saxagliptin HCl (ONGLYZA) 5 MG TABS tablet Take 5 mg by mouth daily.       Allergies  Allergen Reactions  . Demerol (Meperidine) Nausea Only  . Morphine And Related Other (See Comments)    Drenched with perspiration  . Starlix (Nateglinide) Other (See Comments)    gassy    History  Substance Use Topics  . Smoking status: Current Every Day Smoker -- 1.00 packs/day for 60 years    Types: Cigarettes  . Smokeless tobacco: Current User     Comment: snoos 2 Pkg. a day  NOT SMOKED SINCE 07/18/12  . Alcohol Use: No    Family History  Problem Relation Age of Onset  . Breast cancer Sister   . Diabetes Maternal Uncle   . Diabetes Paternal Grandmother   . Heart disease Father   . Heart disease Maternal Uncle      Review of Systems  All other systems reviewed and are negative.    Objective:  Physical Exam  Constitutional: He is oriented to person, place, and time. He appears well-developed and well-nourished.  HENT:  Head: Normocephalic and atraumatic.  Eyes: EOM are normal. Pupils are equal, round, and reactive to light.  Neck: Normal range of motion. Neck supple.  Cardiovascular: Normal rate and regular rhythm.   Respiratory: Effort normal and breath sounds normal.  GI: Soft. Bowel sounds are normal.  Musculoskeletal:       Right hip: He exhibits decreased range of motion, decreased strength, bony  tenderness and crepitus.  Neurological: He is alert and oriented to person, place, and time.  Skin: Skin is warm and dry.  Psychiatric: He has a normal mood and affect.    Vital signs in last 24 hours: Temp:  [97.7 F (36.5 C)] 97.7 F (36.5 C) (08/08 0525) Pulse Rate:  [79] 79 (08/08 0525) Resp:  [18] 18 (08/08 0525) BP: (149)/(58) 149/58 mmHg (08/08 0525) SpO2:  [98 %] 98 % (08/08 0525)  Labs:   Estimated body mass index is 27.35  kg/(m^2) as calculated from the following:   Height as of 08/04/12: 5' 11" (1.803 m).   Weight as of 07/07/12: 88.905 kg (196 lb).   Imaging Review Plain radiographs demonstrate severe degenerative joint disease of the right hip(s). The bone quality appears to be good for age and reported activity level.  Assessment/Plan:  End stage arthritis, right hip(s)  The patient history, physical examination, clinical judgement of the provider and imaging studies are consistent with end stage degenerative joint disease of the right hip(s) and total hip arthroplasty is deemed medically necessary. The treatment options including medical management, injection therapy, arthroscopy and arthroplasty were discussed at length. The risks and benefits of total hip arthroplasty were presented and reviewed. The risks due to aseptic loosening, infection, stiffness, dislocation/subluxation,  thromboembolic complications and other imponderables were discussed.  The patient acknowledged the explanation, agreed to proceed with the plan and consent was signed. Patient is being admitted for inpatient treatment for surgery, pain control, PT, OT, prophylactic antibiotics, VTE prophylaxis, progressive ambulation and ADL's and discharge planning.The patient is planning to be discharged home with home health services

## 2012-08-13 LAB — BASIC METABOLIC PANEL
CO2: 28 mEq/L (ref 19–32)
Calcium: 8.4 mg/dL (ref 8.4–10.5)
Chloride: 99 mEq/L (ref 96–112)
Sodium: 135 mEq/L (ref 135–145)

## 2012-08-13 LAB — GLUCOSE, CAPILLARY
Glucose-Capillary: 196 mg/dL — ABNORMAL HIGH (ref 70–99)
Glucose-Capillary: 183 mg/dL — ABNORMAL HIGH (ref 70–99)

## 2012-08-13 LAB — CBC
Platelets: 118 10*3/uL — ABNORMAL LOW (ref 150–400)
RBC: 3.75 MIL/uL — ABNORMAL LOW (ref 4.22–5.81)
WBC: 5.5 10*3/uL (ref 4.0–10.5)

## 2012-08-13 NOTE — Progress Notes (Signed)
OT Cancellation Note  Patient Details Name: KOURTLAND COOPMAN MRN: [id number] DOB: 06-Oct-1940   Cancelled Treatment:    Reason Eval/Treat Not Completed: Other (comment)  Pt not ready for OT today per PT.  Will reattempt tomorrow.    Zakaria Fromer 08/13/2012, 10:35 AM Lesle Chris, OTR/L [phone] 08/13/2012

## 2012-08-13 NOTE — Evaluation (Signed)
Physical Therapy Evaluation Patient Details Name: Joshua [name] MRN: [id number] DOB: Sep 03, 1940 Today's Date: 08/13/2012 Time: 0828-0903 PT Time Calculation (min): 35 min  PT Assessment / Plan / Recommendation History of Present Illness     Clinical Impression  Pt s/p R THR presents with decreased R LE strength/ROM and post op pain limiting functional mobility.  Pt hopes to progress to d/c home with HHPT and family assist    PT Assessment  Patient needs continued PT services    Follow Up Recommendations  Home health PT    Does the patient have the potential to tolerate intense rehabilitation      Barriers to Discharge        Equipment Recommendations  None recommended by PT    Recommendations for Other Services OT consult   Frequency 7X/week    Precautions / Restrictions Precautions Precautions: Fall Restrictions Weight Bearing Restrictions: No   Pertinent Vitals/Pain 4/10; premed, ice packs provided      Mobility  Bed Mobility Bed Mobility: Supine to Sit Supine to Sit: 1: +2 Total assist Supine to Sit: Patient Percentage: 60% Details for Bed Mobility Assistance: cues for sequence and use of UEs and L LE to self assist Transfers Transfers: Sit to Stand;Stand to Sit Sit to Stand: 1: +2 Total assist;From bed Sit to Stand: Patient Percentage: 60% Stand to Sit: 1: +2 Total assist;To chair/3-in-1;With armrests Stand to Sit: Patient Percentage: 70% Details for Transfer Assistance: cues for LE management and use of UEs to self assist Ambulation/Gait Ambulation/Gait Assistance: 1: +2 Total assist Ambulation/Gait: Patient Percentage: 70% Ambulation Distance (Feet): 18 Feet Assistive device: Rolling walker Ambulation/Gait Assistance Details: cues for posture, sequence and position from RW - assist for stability, support and initially to advance R LE Gait Pattern: Step-to pattern;Decreased step length - right;Decreased step length - left;Antalgic Stairs: No     Exercises Total Joint Exercises Ankle Circles/Pumps: AROM;15 reps;Supine;Both Quad Sets: AROM;5 reps;Both;Supine Heel Slides: AAROM;10 reps;Supine;Right Hip ABduction/ADduction: AAROM;10 reps;Right;Supine   PT Diagnosis: Difficulty walking  PT Problem List: Decreased strength;Decreased range of motion;Decreased activity tolerance;Decreased mobility;Decreased knowledge of use of DME;Pain PT Treatment Interventions: DME instruction;Gait training;Stair training;Functional mobility training;Therapeutic activities;Therapeutic exercise;Patient/family education     PT Goals(Current goals can be found in the care plan section) Acute Rehab PT Goals Patient Stated Goal: Resume previous lifestyle with decreased pain PT Goal Formulation: With patient Potential to Achieve Goals: Good  Visit Information  Last PT Received On: 08/13/12 Assistance Needed: +2       Prior [name] to be discharged to:: Private residence Living Arrangements: Spouse/significant other Available Help at Discharge: Family Type of Home: House Home Access: Stairs to enter Technical brewer of Steps: 4 Entrance Stairs-Rails: Left Home Layout: One Lodge Pole: Environmental consultant - 2 wheels;Cane - single point Prior Function Level of Independence: Independent;Independent with assistive device(s) Communication Communication: HOH Dominant Hand: Right    Cognition  Cognition Arousal/Alertness: Awake/alert Behavior During Therapy: WFL for tasks assessed/performed Overall Cognitive Status: Within Functional Limits for tasks assessed    Extremity/Trunk Assessment Upper Extremity Assessment Upper Extremity Assessment: Overall WFL for tasks assessed Lower Extremity Assessment Lower Extremity Assessment: RLE deficits/detail RLE Deficits / Details: hip strength 2-/5 with AAROM to 70 flex and 10 abd   Balance    End of Session PT - End of Session Equipment Utilized During  Treatment: Gait belt Activity Tolerance: Patient limited by pain Patient left: in chair;with call bell/phone within reach;with family/visitor present Nurse Communication:  Mobility status  GP     Joshua [name] 08/13/2012, 9:17 AM

## 2012-08-13 NOTE — Op Note (Signed)
NAMESAYRE, [name] NO.:  [id number]  MEDICAL RECORD NO.:  [id number]  LOCATION:  9024                         FACILITY:  [facility]  PHYSICIAN:  Joshua [name], M.D.DATE OF BIRTH:  Jan 28, 1940  DATE OF PROCEDURE:  08/12/2012 DATE OF DISCHARGE:                              OPERATIVE REPORT   PREOPERATIVE DIAGNOSES: 1. Severe osteoarthritis, degenerative joint disease, right hip. 2. Retained hardware, right hip.  POSTOPERATIVE DIAGNOSES: 1. Severe osteoarthritis, degenerative joint disease, right hip. 2. Retained hardware, right hip.  PROCEDURES: 1. Removal of retained hardware, right hip. 2. Right direct anterior total hip arthroplasty.  IMPLANTS:  DePuy Sector Gription acetabular component size 52, size 36+ 4 neutral polyethylene liner, size 12 Corail femoral component with varus offset, size 36- 2 metal hip ball.  SURGEON:  Joshua [name], M.D.  ASSISTING:  Erskine Emery, PA-C.  ANESTHESIA:  General.  ANTIBIOTICS:  2 g of IV Ancef.  BLOOD LOSS:  475 mL.  COMPLICATIONS:  None.  INDICATIONS:  Joshua [name] is a 72 year old gentleman who at age 25 about 16 years ago had some type of injury to his right hip.  He is not sure what it was, made a large incision through the front of his hip and x- ray showed some type of hardware like a Knowles pins or blade type of construct in his hip.  He has severe shortening and now bone-on-bone wear and significant degenerative joint disease.  He has flattening of his femoral head, loss of joint space and periarticular osteophytes.  At this point, his quality of life is severely diminished.  He wishes to proceed with a total hip arthroplasty.  There was daily pain.  He understands the difficulty of this due to the hardware that has been in for 60 years.  We could try to accomplish this, but we would need separate incision to take out the hardware first and this will be quite a complicated surgery.  He  understands the risk of fracture, nerve and vessel injury, acute blood loss anemia, infection, DVT and he does wish to proceed.  PROCEDURE DESCRIPTION:  After informed consent was obtained, appropriate right hip was marked, he was brought to the operating room and general anesthesia was obtained while he was on a stretcher.  Foley catheter was placed and then traction boots were placed on both of his feet.  He was next placed supine on the Hana fracture table with the perineal post in place and both legs in inline skeletal traction boots and devices, but no traction applied.  His right operative hip was then assessed radiographically, good internal and external rotation and assessed where the hardware was.  We then backed the C-arm out and prepped it with sterile drapes and then prepped the leg with DuraPrep and sterile drapes as well.  Time-out was called and he was identified as correct patient and correct right hip.  Our first role was to try to remove the hardware.  We made a large incision on the lateral aspect of his hip where we targeted where the hardware was and dissected down the hardware, it was completely covered with bone and we could found where  it was and so we used osteotomes and rongeur to clear bone away from around this device.  We then used a vise-grip and a backslapping hammer was able to back out the hardware, which was some old blade type of construct.  We then left that wound open and proceeded with a direct anterior approach to the hip.  An incision was made just inferior and posterior to the anterior superior iliac spine.  We dissected down the leg to the tensor fascia lata muscle.  The tensor fascia was then divided longitudinally and proceeded with a direct anterior approach to the hip.  A Cobra retractor was placed on the lateral neck and one up underneath the rectus femoris around the medial neck.  I we opened up the joint capsule and then placed the Cobra  retractors within the joint capsule.  We then made our femoral neck cut just proximal to the lesser trochanter with the oscillating saw and completed this with osteotome. I placed a corkscrew guide in the femoral head and removed the femoral head in its entirety.  We found the femoral head to be flattened and devoid of cartilage.  I then placed a bent Hohmann medially and a Hohmann retractor laterally.  I was able to clean the osteophytes and debris from within the acetabulum itself and then began reaming in 2-mm increments from size 42 up to size 52 with all reamers placed under direct visualization and the last two reamers were placed under direct fluoroscopy.  So. We obtained our depth of reaming, our inclination, and anteversion.  I then placed the real DePuy Sector Gription acetabular component size 52 and the apex hole eliminator guide.  We placed the real 36+ 4 neutral polyethylene liner.  Attention was then turned to the femur with the leg externally rotated to about 100 degrees, extend and abduct.  We placed a Mueller retractor medially and Hohmann retractor behind the greater trochanter gently.  We released the lateral joint capsule and used a box cutting osteotome.  Then, I opened the femoral canal and a rongeur to lateralize them again broaching from a size 8 broach using the Corail femoral broaching system from DePuy and broach all the way up to the size 12.  With the size 12, we trialed a KLA to give him varus offset and little bit more offset and then a 36- 2 hip ball, because it had so shorten and I was concerned about reduction. With the leg brought over and up, we reduced it into the pelvis and he was stable with internal and external rotation, we got him considerable length back as well.  We then dislocated the hip again and removed the trial components and then placed the real Corail femoral component size 12 with varus offset and the real 36- 2 metal hip ball, reduced  this back in the acetabulum and again it was stable.  We then copiously irrigated all wounds with normal saline solution using pulsatile lavage, went to the lateral wound first and took some bone graft from its femoral head and packed it into the lateral aspect of the femur around where the hardware was removed.  I then closed the deep IT band with #1 Vicryl suture followed by 0 Vicryl, 2-0 Vicryl, and staples on the skin. We then back to the anterior hip incision and copiously irrigated this with normal saline solution.  I closed the joint capsule with interrupted #1 Ethibond suture followed by running #1 Vicryl in the tensor fascia, 0  Vicryl in the deep tissue, 0 Vicryl in subcutaneous tissue, and staples on the skin.  Aquacel dressing was placed over both incisions.  He was then taken off the Hana table, awakened, extubated and taken to the recovery room in stable condition.  All final counts were correct.  There were no complications noted.  Of note, Erskine Emery, PA-C was present in the entire case and his presence was crucial throughout the entire case for exposure of the hardware, exposure of the hip itself, retraction and placement of the hardware as well as closure of the wound.     Joshua [name], M.D.     CYB/MEDQ  D:  08/12/2012  T:  08/13/2012  Job:  [id number]

## 2012-08-13 NOTE — Plan of Care (Signed)
Problem: Consults Goal: Diagnosis- Total Joint Replacement Outcome: Completed/Met Date Met:  08/13/12 Primary Total Hip RIGHT

## 2012-08-13 NOTE — Progress Notes (Signed)
Subjective: 1 Day Post-Op Procedure(s) (LRB): REMOVAL OF OLD PINS RIGHT HIP AND RIGHT TOTAL HIP ARTHROPLASTY ANTERIOR APPROACH (Right) HARDWARE REMOVAL (Right) Patient reports pain as 5 on 0-10 scale.    Objective: Vital signs in last 24 hours: Temp:  [97.4 F (36.3 C)-98.9 F (37.2 C)] 98.2 F (36.8 C) (08/09 0542) Pulse Rate:  [64-80] 80 (08/09 0542) Resp:  [11-18] 18 (08/09 0542) BP: (103-130)/(49-63) 105/58 mmHg (08/09 0542) SpO2:  [94 %-100 %] 98 % (08/09 0542) Weight:  [99.5 kg (219 lb 5.7 oz)] 99.5 kg (219 lb 5.7 oz) (08/08 1152)  Intake/Output from previous day: 08/08 0701 - 08/09 0700 In: 3480 [P.O.:480; I.V.:3000] Out: 2600 [Urine:2125; Blood:475] Intake/Output this shift: Total I/O In: 240 [P.O.:240] Out: 75 [Urine:75]   Recent Labs  08/13/12 0413  HGB 10.5*    Recent Labs  08/13/12 0413  WBC 5.5  RBC 3.75*  HCT 32.9*  PLT 118*    Recent Labs  08/13/12 0413  NA 135  K 3.9  CL 99  CO2 28  BUN 18  CREATININE 0.88  GLUCOSE 247*  CALCIUM 8.4   No results found for this basename: LABPT, INR,  in the last 72 hours  Neurologically intact  Assessment/Plan: 1 Day Post-Op Procedure(s) (LRB): REMOVAL OF OLD PINS RIGHT HIP AND RIGHT TOTAL HIP ARTHROPLASTY ANTERIOR APPROACH (Right) HARDWARE REMOVAL (Right) Up with therapy  D/C foley  Lynel Forester C 08/13/2012, 9:49 AM

## 2012-08-13 NOTE — Progress Notes (Signed)
Physical Therapy Treatment Patient Details Name: KHOA OPDAHL MRN: [id number] DOB: 1940/07/21 Today's Date: 08/13/2012 Time: 6196-9409 PT Time Calculation (min): 15 min  PT Assessment / Plan / Recommendation  History of Present Illness     PT Comments     Follow Up Recommendations  Home health PT     Does the patient have the potential to tolerate intense rehabilitation     Barriers to Discharge        Equipment Recommendations  None recommended by PT    Recommendations for Other Services OT consult  Frequency 7X/week   Progress towards PT Goals Progress towards PT goals: Progressing toward goals  Plan Current plan remains appropriate    Precautions / Restrictions Precautions Precautions: Fall Restrictions Weight Bearing Restrictions: No   Pertinent Vitals/Pain 8/10; RN aware, ice packs provided    Mobility  Bed Mobility Bed Mobility: Sit to Supine Sit to Supine: 1: +2 Total assist Sit to Supine: Patient Percentage: 60% Details for Bed Mobility Assistance: cues for sequence and use of UEs and L LE to self assist Transfers Transfers: Sit to Stand;Stand to Sit Sit to Stand: 1: +2 Total assist;From chair/3-in-1 Sit to Stand: Patient Percentage: 60% Stand to Sit: To bed;1: +2 Total assist Stand to Sit: Patient Percentage: 70% Details for Transfer Assistance: cues for LE management and use of UEs to self assist Ambulation/Gait Ambulation/Gait Assistance: 1: +2 Total assist Ambulation/Gait: Patient Percentage: 70% Ambulation Distance (Feet): 5 Feet Assistive device: Rolling walker Ambulation/Gait Assistance Details: cues for posture, sequence, position from RW Gait Pattern: Step-to pattern;Decreased step length - right;Decreased step length - left;Antalgic    Exercises     PT Diagnosis:    PT Problem List:   PT Treatment Interventions:     PT Goals (current goals can now be found in the care plan section) Acute Rehab PT Goals Patient Stated Goal: Resume  previous lifestyle with decreased pain PT Goal Formulation: With patient Potential to Achieve Goals: Good  Visit Information  Last PT Received On: 08/13/12 Assistance Needed: +2    Subjective Data  Patient Stated Goal: Resume previous lifestyle with decreased pain   Cognition  Cognition Arousal/Alertness: Awake/alert Behavior During Therapy: WFL for tasks assessed/performed Overall Cognitive Status: Within Functional Limits for tasks assessed    Balance     End of Session PT - End of Session Equipment Utilized During Treatment: Gait belt Activity Tolerance: Patient limited by pain Patient left: in bed;with call bell/phone within reach;with family/visitor present Nurse Communication: Mobility status;Patient requests pain meds   GP     Nidya Bouyer 08/13/2012, 1:08 PM

## 2012-08-14 LAB — CBC
MCHC: 32.9 g/dL (ref 30.0–36.0)
MCV: 87.8 fL (ref 78.0–100.0)
Platelets: 109 10*3/uL — ABNORMAL LOW (ref 150–400)
RDW: 15 % (ref 11.5–15.5)
WBC: 5.2 10*3/uL (ref 4.0–10.5)

## 2012-08-14 LAB — GLUCOSE, CAPILLARY
Glucose-Capillary: 257 mg/dL — ABNORMAL HIGH (ref 70–99)
Glucose-Capillary: 220 mg/dL — ABNORMAL HIGH (ref 70–99)

## 2012-08-14 MED ORDER — RIVAROXABAN 10 MG PO TABS: 10.0000 mg | ORAL_TABLET | Freq: Every day | ORAL | Status: DC

## 2012-08-14 MED ORDER — LOPERAMIDE HCL 2 MG PO CAPS: 2.0000 mg | ORAL_CAPSULE | ORAL | Status: DC | PRN

## 2012-08-14 MED ORDER — OXYCODONE-ACETAMINOPHEN 5-325 MG PO TABS: 1.0000 | ORAL_TABLET | ORAL | Status: DC | PRN

## 2012-08-14 NOTE — Care Management Note (Addendum)
    Page 1 of 1   08/18/2012     3:25:03 PM   CARE MANAGEMENT NOTE 08/18/2012  Patient:  Joshua [name], Joshua [name]   Account Number:  [id number]  Date Initiated:  08/14/2012  Documentation initiated by:  Dessa Phi  Subjective/Objective Assessment:   ADMITTED W/L HIP DEGENERATIVE ARTHRITIS.     Action/Plan:   FROM HOME W/SPOUSE.   Anticipated DC Date:  08/16/2012   Anticipated DC Plan:  [place]  CM consult      Choice offered to / List presented to:  C-1 Patient           Status of service:  Completed, signed off Medicare Important Message given?   (If response is "NO", the following Medicare IM given date fields will be blank) Date Medicare IM given:   Date Additional Medicare IM given:    Discharge Disposition:  [place]  Per UR Regulation:  Reviewed for med. necessity/level of care/duration of stay  If discussed at [place] of Stay Meetings, dates discussed:    Comments:  08/14/12 KATHY MAHABIR RN,BSN NCM WEEKEND [place].PT-SNF.[facility] ALREADY FOLLOWING IF D/C PLANS CHANGE TO HOME.

## 2012-08-14 NOTE — Progress Notes (Signed)
Clinical Social Work Department BRIEF PSYCHOSOCIAL ASSESSMENT 08/14/2012  Patient:  Joshua [name], Joshua [name]     Account Number:  [id number]     Admit date:  08/12/2012  Clinical Social Worker:  Levie Heritage  Date/Time:  08/14/2012 03:16 PM  Referred by:  Physician  Date Referred:  08/14/2012  Other Referral:   Interview type:  Patient Other interview type:   Wife and daughter at bedside    PSYCHOSOCIAL DATA Living Status:  WIFE Admitted from facility:   Level of care:   Primary support name:  Mrs. Klem Primary support relationship to patient:  SPOUSE Degree of support available:   strong    CURRENT CONCERNS Current Concerns  Post-Acute Placement   Other Concerns:    SOCIAL WORK ASSESSMENT / PLAN Met with Pt, wife and daughter at bedside.    CSW discussed Pt's d/c plans.  Pt reported that he doesn't necessarily want to go to SNF but that he has 5 steep steps at his house that he knows he can maneuver.    CSW discussed the SNF process and Pt was agreeable for CSW to begin the SNF search.    CSW thanked Pt and family for their time.   Assessment/plan status:  Psychosocial Support/Ongoing Assessment of Needs Other assessment/ plan:   Information/referral to community resources:   SNF list    PATIENT'S/FAMILY'S RESPONSE TO PLAN OF CARE: Pt made it clear that he doesn't want to go to SNF but that he understands that he can't go home in his current condition.  Pt is agreeable to SNF.    Pt and family thanked CSW for time and assistance.   Bernita Raisin, [place] Work [phone]

## 2012-08-14 NOTE — Progress Notes (Signed)
Physical Therapy Treatment Patient Details Name: Joshua [name] MRN: [id number] DOB: 18-Jan-1940 Today's Date: 08/14/2012 Time: 6060-0459 PT Time Calculation (min): 15 min  PT Assessment / Plan / Recommendation  History of Present Illness     PT Comments   Pt noted to have many bouts of loose stools this morning.  CDiff culture pending at time of session.  Requires mod to max assist for mobility at this time.  Discussed D/C plans with pt and PT/pt feel that he would benefit from SNF at D/C to maximize pts safety and function as wife is not in very good health and not able to provide amount of assist. CSW notified of D/C plans.   Follow Up Recommendations  SNF;Supervision/Assistance - 24 hour     Does the patient have the potential to tolerate intense rehabilitation     Barriers to Discharge        Equipment Recommendations  None recommended by PT    Recommendations for Other Services OT consult  Frequency 7X/week   Progress towards PT Goals Progress towards PT goals: Progressing toward goals  Plan Discharge plan needs to be updated    Precautions / Restrictions Precautions Precautions: Fall Restrictions Weight Bearing Restrictions: No   Pertinent Vitals/Pain 6/10, ice pack applied    Mobility  Bed Mobility Bed Mobility: Supine to Sit Supine to Sit: 3: Mod assist;2: Max assist;HOB elevated;With rails Details for Bed Mobility Assistance: Requires assist for RLE out of bed and also for trunk to get into upright seated position.  Pt with increased difficulty utilzing UE on bed to self assist, even with hand held assist provided.  Noted that he kept losing balance posteriorly and could not maintain LEs on floor.  Transfers Transfers: Sit to Stand;Stand to Sit Sit to Stand: 3: Mod assist;From elevated surface;From bed Stand to Sit: 3: Mod assist;With upper extremity assist;To chair/3-in-1;With armrests Details for Transfer Assistance: Assist to rise, steady and lower to ensure  controlled descent when sitting.  Max cues for hand placement and safety when sitting/standing.  Ambulation/Gait Ambulation/Gait Assistance: 3: Mod assist Ambulation Distance (Feet): 10 Feet Assistive device: Rolling walker Ambulation/Gait Assistance Details: Assist to steady throughout with mod cues for sequencing/technique with RW, maintaining upright posture, and increasing UE WB to decrease antalgic gait.  Pt unable to ambulate more than 10' today due to pain and fatigue.  RN/MD aware of mobility status.  Gait Pattern: Step-to pattern;Decreased step length - right;Decreased step length - left;Antalgic Gait velocity: very slow    Exercises     PT Diagnosis:    PT Problem List:   PT Treatment Interventions:     PT Goals (current goals can now be found in the care plan section) Acute Rehab PT Goals Patient Stated Goal: Resume previous lifestyle with decreased pain PT Goal Formulation: With patient Potential to Achieve Goals: Good  Visit Information  Last PT Received On: 08/14/12 Assistance Needed: +1    Subjective Data  Patient Stated Goal: Resume previous lifestyle with decreased pain   Cognition  Cognition Arousal/Alertness: Awake/alert Behavior During Therapy: WFL for tasks assessed/performed Overall Cognitive Status: Within Functional Limits for tasks assessed    Balance     End of Session PT - End of Session Equipment Utilized During Treatment: Gait belt Activity Tolerance: Patient limited by pain;Patient limited by fatigue Patient left: in chair;with call bell/phone within reach Nurse Communication: Mobility status   GP     Denice Bors 08/14/2012, 11:50 AM

## 2012-08-14 NOTE — Progress Notes (Signed)
Patient with two extremely loose diarrhea stools beginning this morning, each with VERY foul odor. Second episode was incontinent. Light brown, watery. Continuing to monitor, [employer], RN 08/14/2012 6:42 AM

## 2012-08-14 NOTE — Progress Notes (Signed)
OT Cancellation Note  Patient Details Name: Joshua [name] MRN: [id number] DOB: 1940-05-02   Cancelled Treatment:    Reason Eval/Treat Not Completed: Other (comment) (Patient has diarrhea and fatigued from PT)  Aleli Navedo A 08/14/2012, 12:22 PM

## 2012-08-14 NOTE — Progress Notes (Signed)
Subjective: 2 Days Post-Op Procedure(s) (LRB): REMOVAL OF OLD PINS RIGHT HIP AND RIGHT TOTAL HIP ARTHROPLASTY ANTERIOR APPROACH (Right) HARDWARE REMOVAL (Right) Patient reports pain as moderate.    Objective: Vital signs in last 24 hours: Temp:  [98.8 F (37.1 C)-99.2 F (37.3 C)] 99.2 F (37.3 C) (08/10 0734) Pulse Rate:  [92-103] 103 (08/10 0734) Resp:  [18-20] 19 (08/10 0734) BP: (97-109)/(55-57) 109/57 mmHg (08/10 0734) SpO2:  [90 %-96 %] 94 % (08/10 0734)  Intake/Output from previous day: 08/09 0701 - 08/10 0700 In: 410 [P.O.:410] Out: 903 [Urine:900; Stool:3] Intake/Output this shift: Total I/O In: 240 [P.O.:240] Out: -    Recent Labs  08/13/12 0413 08/14/12 0659  HGB 10.5* 9.7*    Recent Labs  08/13/12 0413 08/14/12 0659  WBC 5.5 5.2  RBC 3.75* 3.36*  HCT 32.9* 29.5*  PLT 118* 109*    Recent Labs  08/13/12 0413  NA 135  K 3.9  CL 99  CO2 28  BUN 18  CREATININE 0.88  GLUCOSE 247*  CALCIUM 8.4   No results found for this basename: LABPT, INR,  in the last 72 hours  Neurologically intact  Assessment/Plan: 2 Days Post-Op Procedure(s) (LRB): REMOVAL OF OLD PINS RIGHT HIP AND RIGHT TOTAL HIP ARTHROPLASTY ANTERIOR APPROACH (Right) HARDWARE REMOVAL (Right) Up with therapy  HAD DIARRHEA TIMES 3 , HX OF DIARRHEA OCCASIONALLY IN PAST. TAKES IMODIUM FOR IT.  D/C [facility]. IMODIUM ODERED. SLOW WITH PT.  Rodell Perna C 08/14/2012, 10:35 AM

## 2012-08-14 NOTE — Progress Notes (Signed)
Clinical Social Work Department CLINICAL SOCIAL WORK PLACEMENT NOTE 08/14/2012  Patient:  LORETO, LOESCHER  Account Number:  [id number] Admit date:  08/12/2012  Clinical Social Worker:  Levie Heritage  Date/time:  08/14/2012 03:21 PM  Clinical Social Work is seeking post-discharge placement for this patient at the following level of care:   SKILLED NURSING   (*CSW will update this form in Epic as items are completed)   08/14/2012  Patient/family provided with [facility] Department of Clinical Social Work's list of facilities offering this level of care within the geographic area requested by the patient (or if unable, by the patient's family).  08/14/2012  Patient/family informed of their freedom to choose among providers that offer the needed level of care, that participate in Medicare, Medicaid or managed care program needed by the patient, have an available bed and are willing to accept the patient.  08/14/2012  Patient/family informed of [facility]' ownership interest in [facility], as well as of the fact that they are under no obligation to receive care at this facility.  PASARR submitted to EDS on 08/14/2012 PASARR number received from EDS on 08/14/2012  FL2 transmitted to all facilities in geographic area requested by pt/family on  08/14/2012 FL2 transmitted to all facilities within larger geographic area on   Patient informed that his/her managed care company has contracts with or will negotiate with  certain facilities, including the following:     Patient/family informed of bed offers received:   Patient chooses bed at  Physician recommends and patient chooses bed at    Patient to be transferred to  on   Patient to be transferred to facility by   The following physician request were entered in Epic:   Additional Comments:  Bernita Raisin, [place] Work [phone]

## 2012-08-15 ENCOUNTER — Encounter (HOSPITAL_COMMUNITY): Payer: Self-pay | Admitting: Orthopaedic Surgery

## 2012-08-15 LAB — CBC
HCT: 26.3 % — ABNORMAL LOW (ref 39.0–52.0)
MCH: 28.4 pg (ref 26.0–34.0)
MCHC: 32.7 g/dL (ref 30.0–36.0)
MCV: 86.8 fL (ref 78.0–100.0)
Platelets: 95 10*3/uL — ABNORMAL LOW (ref 150–400)
RDW: 14.9 % (ref 11.5–15.5)

## 2012-08-15 LAB — BASIC METABOLIC PANEL
BUN: 35 mg/dL — ABNORMAL HIGH (ref 6–23)
Calcium: 8.3 mg/dL — ABNORMAL LOW (ref 8.4–10.5)
Creatinine, Ser: 0.8 mg/dL (ref 0.50–1.35)
GFR calc Af Amer: >90 mL/min (ref >90)

## 2012-08-15 LAB — GLUCOSE, CAPILLARY
Glucose-Capillary: 212 mg/dL — ABNORMAL HIGH (ref 70–99)
Glucose-Capillary: 213 mg/dL — ABNORMAL HIGH (ref 70–99)
Glucose-Capillary: 113 mg/dL — ABNORMAL HIGH (ref 70–99)
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
## 2012-08-15 NOTE — Evaluation (Signed)
Occupational Therapy Evaluation Patient Details Name: Joshua [name] MRN: [id number] DOB: 1940-07-05 Today's Date: 08/15/2012 Time: 2094-7096 OT Time Calculation (min): 25 min  OT Assessment / Plan / Recommendation History of present illness Pt underwent R hip surgery, having old pins removed and anterior direct THA.   Clinical Impression   Pt was admitted for the above.  He will benefit from skilled OT to increase independence and safety with adls.  Goals in acute are for min A level.  Pt was independent/mod I with adls PTA.      OT Assessment  Patient needs continued OT Services    Follow Up Recommendations  SNF    Barriers to Discharge      Equipment Recommendations  3 in 1 bedside comode    Recommendations for Other Services    Frequency  Min 2X/week    Precautions / Restrictions Precautions Precautions: Fall Restrictions Weight Bearing Restrictions: No   Pertinent Vitals/Pain Pt states that he cannot rate pain; had quite a bit.  Repositioned with ice and notified RN.  Dyspnea 2/4 with activity    ADL  Grooming: Set up Where Assessed - Grooming: Supported sitting Upper Body Bathing: Set up Where Assessed - Upper Body Bathing: Unsupported sitting Lower Body Bathing: Moderate assistance Where Assessed - Lower Body Bathing: Supported sit to stand Upper Body Dressing: Set up Where Assessed - Upper Body Dressing: Unsupported sitting Lower Body Dressing: +1 Total assistance (pt 10%) Where Assessed - Lower Body Dressing: Supported sit to stand Toileting - Water quality scientist and Hygiene: Maximal assistance Where Assessed - Best boy and Hygiene: Sit to stand from 3-in-1 or toilet Equipment Used: Rolling walker;Sock aid Transfers/Ambulation Related to ADLs: Pt stood with extra time.  Has difficulty with transitional movements.  Attempted to walk to bathroom, but pt did not feel he could do this.  Had difficulty advancing L foot.  Pt tends to stand in  forward flexed position ADL Comments: Introduced sock aid and pt practiced with this.  He has a Secondary school teacher at home.  Educated to listen to body and not push past pain with adls.      OT Diagnosis: Generalized weakness  OT Problem List: Decreased strength;Decreased activity tolerance;Decreased knowledge of use of DME or AE;Pain OT Treatment Interventions: Self-care/ADL training;DME and/or AE instruction;Therapeutic activities;Patient/family education;Energy conservation   OT Goals(Current goals can be found in the care plan section) Acute Rehab OT Goals Patient Stated Goal: get back to being independent OT Goal Formulation: With patient Time For Goal Achievement: 08/22/12 Potential to Achieve Goals: Good ADL Goals Pt Will Perform Lower Body Bathing: with min assist;sit to/from stand;with adaptive equipment Pt Will Perform Lower Body Dressing: with mod assist;with adaptive equipment;sit to/from stand Pt Will Transfer to Toilet: with min assist;stand pivot transfer;bedside commode Additional ADL Goal #1: Pt will initiate rest breaks as needed with adls, for energy conservation  Visit Information  Last OT Received On: 08/15/12 Assistance Needed: +1 History of Present Illness: Pt underwent R hip surgery, having old pins removed and anterior direct THA.       Prior Functioning     Home Living Family/patient expects to be discharged to:: Skilled nursing facility Prior Function Level of Independence: Independent;Independent with assistive device(s) Communication Communication: HOH Dominant Hand: Right         Vision/Perception     Cognition  Cognition Arousal/Alertness: Awake/alert Behavior During Therapy: WFL for tasks assessed/performed Overall Cognitive Status: Within Functional Limits for tasks assessed    Extremity/Trunk Assessment Upper  Extremity Assessment Upper Extremity Assessment: Overall WFL for tasks assessed (strength grossly 4-/5)     Mobility Transfers Sit  to Stand: 3: Mod assist;From chair/3-in-1;With armrests Details for Transfer Assistance: extra time.  Assist to rise, prolonged help as pt stayed in mid position with both hands on chair for a minute     Exercise     Balance     End of Session OT - End of Session Activity Tolerance: Patient limited by fatigue;Patient limited by pain Patient left: in chair;with call bell/phone within reach;with family/visitor present  [place] 08/15/2012, 9:09 AM Lesle Chris, OTR/L [phone] 08/15/2012

## 2012-08-15 NOTE — Discharge Summary (Signed)
Patient ID: Joshua [name] MRN: [id number] DOB/AGE: Jun 09, 1940 72 y.o.  Admit date: 08/12/2012 Discharge date: 08/15/2012  Admission Diagnoses:  Principal Problem:   Degenerative arthritis of hip   Discharge Diagnoses:  Same  Past Medical History  Diagnosis Date  . DM type 2 (diabetes mellitus, type 2)   . Hypertension   . Tobacco dependence     Prior failure to quit with wellbutrin.  100+ pack-yr history  . CKD (chronic kidney disease), stage I   . Hyperlipidemia   . Obesity   . Hyperplastic colon polyp 2001  . COPD (chronic obstructive pulmonary disease)     Spirometry  2004 borderline obstruction  . Erectile dysfunction     Normal testosterone  . Diabetic nephropathy     Elevated urine microalb/cr 03/2011  . Shortness of breath     exertion  . 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  . Adenomatous colon polyp 10/16/2011    Repeat 2018  . 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 pylori neg, no dysplasia or malignancy.  . Macular degeneration, dry     Mild, bilat (Optometrist, DMayford Knifeat [employer] [place]  . History of pneumonia     YRS AGO  . Arthritis      Hips, R>L & KNEES    Surgeries: Procedure(s): REMOVAL OF OLD PINS RIGHT HIP AND RIGHT TOTAL HIP ARTHROPLASTY ANTERIOR APPROACH HARDWARE REMOVAL on 08/12/2012   Consultants:    Discharged Condition: Improved  Hospital Course: JKERMIT ARNETTEis an 72y.o. male who was admitted 08/12/2012 for operative treatment ofDegenerative arthritis of hip. Patient has severe unremitting pain that affects sleep, daily activities, and work/hobbies. After pre-op clearance the patient was taken to the operating room on 08/12/2012 and underwent  Procedure(s): REMOVAL OF OLD PINS RIGHT HIP AND RIGHT TOTAL HIP ARTHROPLASTY ANTERIOR APPROACH HARDWARE REMOVAL.    Patient  was given perioperative antibiotics: Anti-infectives   Start     Dose/Rate Route Frequency Ordered Stop   08/12/12 1330  ceFAZolin (ANCEF) IVPB 1 g/50 mL premix     1 g 100 mL/hr over 30 Minutes Intravenous Every 6 hours 08/12/12 1211 08/12/12 2020   08/12/12 0523  ceFAZolin (ANCEF) IVPB 2 g/50 mL premix     2 g 100 mL/hr over 30 Minutes Intravenous On call to O.R. 08/12/12 0048808/08/14 0737       Patient was given sequential compression devices, early ambulation, and chemoprophylaxis to prevent DVT.  Patient benefited maximally from hospital stay and there were no complications.    Recent vital signs: Patient Vitals for the past 24 hrs:  BP Temp Temp src Pulse Resp SpO2  08/15/12 0600 118/68 mmHg 98.5 F (36.9 C) Oral 99 20 98 %  08/15/12 0331 - - - - 20 -  08/15/12 0000 - - - - 20 -  08/14/12 2100 115/67 mmHg 98.7 F (37.1 C) Oral 92 20 91 %  08/14/12 2000 - - - - 20 -  08/14/12 1600 - - - - 16 96 %  08/14/12 1334 121/68 mmHg 99.6 F (37.6 C) Oral 107 16 94 %  08/14/12 1123 93/51 mmHg - - - - -  08/14/12 0734 109/57 mmHg 99.2 F (37.3 C) Oral 103 19 94 %     Recent laboratory studies:  Recent Labs  08/13/12 0413 08/14/12 0659 08/15/12  0405  WBC 5.5 5.2 4.8  HGB 10.5* 9.7* 8.6*  HCT 32.9* 29.5* 26.3*  PLT 118* 109* 95*  NA 135  --  134*  K 3.9  --  3.6  CL 99  --  99  CO2 28  --  26  BUN 18  --  35*  CREATININE 0.88  --  0.80  GLUCOSE 247*  --  253*  CALCIUM 8.4  --  8.3*     Discharge Medications:     Medication List    STOP taking these medications       acetaminophen 500 MG tablet  Commonly known as:  TYLENOL     aspirin 81 MG tablet      TAKE these medications       chlorthalidone 25 MG tablet  Commonly known as:  HYGROTON  Take 25 mg by mouth daily.     Chromium Picolinate 1000 MCG Tabs  Take 1 tablet by mouth daily.     ferrous sulfate 325 (65 FE) MG tablet  Take 325 mg by mouth daily with breakfast.     glimepiride 4 MG tablet   Commonly known as:  AMARYL  Take 4 mg by mouth 2 (two) times daily.     lovastatin 40 MG tablet  Commonly known as:  MEVACOR  Take 40 mg by mouth 2 (two) times daily.     metFORMIN 1000 MG tablet  Commonly known as:  GLUCOPHAGE  Take 1,000 mg by mouth 2 (two) times daily with a meal.     multivitamin with minerals Tabs tablet  Take 1 tablet by mouth daily.     saxagliptin HCl 5 MG Tabs tablet  Commonly known as:  ONGLYZA  Take 1 tablet (5 mg total) by mouth daily.     ONGLYZA 5 MG Tabs tablet  Generic drug:  saxagliptin HCl  Take 5 mg by mouth daily.     oxyCODONE-acetaminophen 5-325 MG per tablet  Commonly known as:  ROXICET  Take 1-2 tablets by mouth every 4 (four) hours as needed for pain.     pioglitazone 45 MG tablet  Commonly known as:  ACTOS  Take 1 tablet (45 mg total) by mouth daily.     ramipril 10 MG capsule  Commonly known as:  ALTACE  Take 10 mg by mouth 2 (two) times daily.     rivaroxaban 10 MG Tabs tablet  Commonly known as:  XARELTO  Take 1 tablet (10 mg total) by mouth daily with breakfast.     Vitamin D-3 1000 UNITS Caps  Take 1 capsule by mouth daily.        Diagnostic Studies: Dg Hip 1 View Right  08/12/2012   *RADIOLOGY REPORT*  Clinical Data: Right anterior hip replacement  RIGHT HIP - 1 VIEW  Comparison: Single digital C-arm fluoroscopic images intraoperatively at 0841 hours interpreted postoperatively without priors for comparison.  Findings: Single AP view demonstrates acetabular and femoral components of a right hip prosthesis in expected positions. No fracture or dislocation identified. Superimposed external artifacts obscure portions of the proximal right femur.  IMPRESSION: Right hip prosthesis without apparent acute complication.   Original Report Authenticated By: Lavonia Dana, M.D.   Dg Pelvis Portable  08/12/2012   *RADIOLOGY REPORT*  Clinical Data: Postop right hip replacement  PORTABLE PELVIS  Comparison: None.  Findings: Study  reviewed along with the cross-table portable film performed at the same time.  The patient is status post a right total hip replacement.  There is no evidence for immediate hardware complications.  Gas in the soft tissues is compatible with immediate postoperative state.  IMPRESSION: Status post right total hip replacement without evidence for immediate hardware complications.   Original Report Authenticated By: Misty Stanley, M.D.   Dg Hip Portable 1 View Right  08/12/2012   *RADIOLOGY REPORT*  Clinical Data: Postop from right hip replacement  PORTABLE RIGHT HIP - 1 VIEW  Comparison: Intraoperative films from earlier the same day  Findings: Cross-table portable view obtained at to 1101 hours shows a right total hip prosthesis.  The femoral component is located within the acetabular cup.  Gas in the adjacent soft tissues is compatible with immediate postoperative state.  IMPRESSION: No evidence for dislocation of the right total hip replacement.   Original Report Authenticated By: Misty Stanley, M.D.   Dg C-arm 61-120 Min-no Report  08/12/2012   CLINICAL DATA: right anterior hip   C-ARM 61-120 MINUTES  Fluoroscopy was utilized by the requesting physician.  No radiographic  interpretation.     Disposition:  To short-term skilled nursing facility      Discharge Orders   Future Appointments Provider Department Dept Phone   11/07/2012 1:15 PM Tammi Sou, MD [facility] [place] [phone]   Future Orders Complete By Expires     Discharge wound care:  As directed     Comments:      Keep dressing clean and intact. May shower with dressing intact. On Friday may remove dressing and get incision wet then apply clean dressing.    Weight bearing as tolerated  As directed        Follow-up Information   Follow up with Mcarthur Rossetti, MD. Schedule an appointment as soon as possible for a visit in 2 weeks.   Contact information:   [place]  [address] [phone]        Signed: Mcarthur Rossetti 08/15/2012, 7:27 AM

## 2012-08-15 NOTE — Progress Notes (Signed)
Subjective: 3 Days Post-Op Procedure(s) (LRB): REMOVAL OF OLD PINS RIGHT HIP AND RIGHT TOTAL HIP ARTHROPLASTY ANTERIOR APPROACH (Right) HARDWARE REMOVAL (Right) Patient reports pain as moderate.  Asymptomatic acute blood loss anemia.  Slow progress with PT.  Recommending ST-SNF placement.  Objective: Vital signs in last 24 hours: Temp:  [98.5 F (36.9 C)-99.6 F (37.6 C)] 98.5 F (36.9 C) (08/11 0600) Pulse Rate:  [92-107] 99 (08/11 0600) Resp:  [16-20] 20 (08/11 0600) BP: (93-121)/(51-68) 118/68 mmHg (08/11 0600) SpO2:  [91 %-98 %] 98 % (08/11 0600)  Intake/Output from previous day: 08/10 0701 - 08/11 0700 In: 720 [P.O.:720] Out: 1250 [Urine:1250] Intake/Output this shift:     Recent Labs  08/13/12 0413 08/14/12 0659 08/15/12 0405  HGB 10.5* 9.7* 8.6*    Recent Labs  08/14/12 0659 08/15/12 0405  WBC 5.2 4.8  RBC 3.36* 3.03*  HCT 29.5* 26.3*  PLT 109* 95*    Recent Labs  08/13/12 0413 08/15/12 0405  NA 135 134*  K 3.9 3.6  CL 99 99  CO2 28 26  BUN 18 35*  CREATININE 0.88 0.80  GLUCOSE 247* 253*  CALCIUM 8.4 8.3*   No results found for this basename: LABPT, INR,  in the last 72 hours  Sensation intact distally Intact pulses distally Dorsiflexion/Plantar flexion intact Incision: scant drainage  Assessment/Plan: 3 Days Post-Op Procedure(s) (LRB): REMOVAL OF OLD PINS RIGHT HIP AND RIGHT TOTAL HIP ARTHROPLASTY ANTERIOR APPROACH (Right) HARDWARE REMOVAL (Right) Discharge to SNF  Aiken Withem Y 08/15/2012, 7:16 AM

## 2012-08-15 NOTE — Progress Notes (Signed)
CSW assisting with d/c planning. Today's PT / OT notes will sent to [facility], when available,  for review. All other clinicals have been provided to [facility]. Insurance authorization is needed for SNF placement. CSW met with pt/spouse this am to review SNF choice. CSW will return later this am to see which facility pt/family have chosen.  Werner Lean LCSW [phone]

## 2012-08-15 NOTE — Progress Notes (Addendum)
CSW has contacted [employer] insurance to check on authorization for SNF placement. No decision has been made by insurance. Pt/spouse/nsg has been updated. CSW will continue to follow.  Werner Lean LCSW [phone]  [phone]   Still no decision from insurance regarding authorization for SNF placement. CSW has contacted pt/spouse/SNF to provide update. Nsg. Will update MD. CSW will contact insurance RNCM in am to continue authorization process.  Werner Lean LCSW

## 2012-08-16 ENCOUNTER — Other Ambulatory Visit: Payer: Self-pay | Admitting: Geriatric Medicine

## 2012-08-16 LAB — GLUCOSE, CAPILLARY
Glucose-Capillary: 225 mg/dL — ABNORMAL HIGH (ref 70–99)
Glucose-Capillary: 184 mg/dL — ABNORMAL HIGH (ref 70–99)

## 2012-08-16 MED ORDER — OXYCODONE-ACETAMINOPHEN 5-325 MG PO TABS: 1.0000 | ORAL_TABLET | ORAL | Status: DC | PRN

## 2012-08-16 NOTE — Progress Notes (Signed)
CSW spoke with RNCM from pt's insurance company this afternoon. Psychologist, occupational does not feel pt is stable for d/c today. CSW is requested to send MD PN in am. CSW has updated pt/SNF. Nsg will update MD.  Insurance will approve [facility] Re[facility] once pt is stable for d/c.   Werner Lean LCSW [phone]

## 2012-08-16 NOTE — Progress Notes (Signed)
Subjective: 4 Days Post-Op Procedure(s) (LRB): REMOVAL OF OLD PINS RIGHT HIP AND RIGHT TOTAL HIP ARTHROPLASTY ANTERIOR APPROACH (Right) HARDWARE REMOVAL (Right) Patient reports pain as moderate.  He reports that he rested better and feels like he is making progress.  Objective: Vital signs in last 24 hours: Temp:  [98.6 F (37 C)-99.8 F (37.7 C)] 98.6 F (37 C) (08/12 0530) Pulse Rate:  [87-93] 89 (08/12 0530) Resp:  [15-16] 16 (08/12 0530) BP: (110-118)/(63-64) 111/64 mmHg (08/12 0530) SpO2:  [95 %-96 %] 96 % (08/12 0530)  Intake/Output from previous day: 08/11 0701 - 08/12 0700 In: 1560 [P.O.:1560] Out: 2300 [Urine:2300] Intake/Output this shift:     Recent Labs  08/14/12 0659 08/15/12 0405  HGB 9.7* 8.6*    Recent Labs  08/14/12 0659 08/15/12 0405  WBC 5.2 4.8  RBC 3.36* 3.03*  HCT 29.5* 26.3*  PLT 109* 95*    Recent Labs  08/15/12 0405  NA 134*  K 3.6  CL 99  CO2 26  BUN 35*  CREATININE 0.80  GLUCOSE 253*  CALCIUM 8.3*   No results found for this basename: LABPT, INR,  in the last 72 hours  Sensation intact distally Intact pulses distally Dorsiflexion/Plantar flexion intact Incision: dressing C/D/I  Assessment/Plan: 4 Days Post-Op Procedure(s) (LRB): REMOVAL OF OLD PINS RIGHT HIP AND RIGHT TOTAL HIP ARTHROPLASTY ANTERIOR APPROACH (Right) HARDWARE REMOVAL (Right) Discharge to SNF  BLACKMAN,CHRISTOPHER Y 08/16/2012, 7:14 AM
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
## 2012-08-16 NOTE — Progress Notes (Signed)
Pt rt hip incision note to be bleeding, rt upper leg swollen and warm to touch, last temp 98.6. Dr. Ninfa Linden notified, orders received.
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
## 2012-08-16 NOTE — Progress Notes (Signed)
CSW spoke with insurance RNCM this am. PT note will be sent to insurance once available. SNF authorization to follow. CSW will continue to follow. Pt / nsg updated.  Werner Lean LCSW [phone]

## 2012-08-17 ENCOUNTER — Non-Acute Institutional Stay (SKILLED_NURSING_FACILITY): Payer: Medicare Other | Admitting: Internal Medicine

## 2012-08-17 ENCOUNTER — Encounter: Payer: Self-pay | Admitting: Internal Medicine

## 2012-08-17 DIAGNOSIS — I10 Essential (primary) hypertension: Secondary | ICD-10-CM

## 2012-08-17 DIAGNOSIS — D509 Iron deficiency anemia, unspecified: Secondary | ICD-10-CM

## 2012-08-17 DIAGNOSIS — Z96649 Presence of unspecified artificial hip joint: Secondary | ICD-10-CM

## 2012-08-17 DIAGNOSIS — M1990 Unspecified osteoarthritis, unspecified site: Secondary | ICD-10-CM | POA: Insufficient documentation

## 2012-08-17 DIAGNOSIS — J449 Chronic obstructive pulmonary disease, unspecified: Secondary | ICD-10-CM | POA: Insufficient documentation

## 2012-08-17 DIAGNOSIS — E669 Obesity, unspecified: Secondary | ICD-10-CM | POA: Insufficient documentation

## 2012-08-17 DIAGNOSIS — N181 Chronic kidney disease, stage 1: Secondary | ICD-10-CM

## 2012-08-17 LAB — GLUCOSE, CAPILLARY
Glucose-Capillary: 163 mg/dL — ABNORMAL HIGH (ref 70–99)
Glucose-Capillary: 248 mg/dL — ABNORMAL HIGH (ref 70–99)

## 2012-08-17 LAB — CBC
Hemoglobin: 8.7 g/dL — ABNORMAL LOW (ref 13.0–17.0)
MCH: 28.7 pg (ref 26.0–34.0)
MCV: 86.5 fL (ref 78.0–100.0)
RBC: 3.03 MIL/uL — ABNORMAL LOW (ref 4.22–5.81)

## 2012-08-17 MED ORDER — ASPIRIN EC 325 MG PO TBEC: 325.0000 mg | DELAYED_RELEASE_TABLET | Freq: Two times a day (BID) | ORAL | Status: DC

## 2012-08-17 NOTE — Assessment & Plan Note (Signed)
Known problem;pt is on iron-and is now s/p surgery-will recheck CBC Monday 8/18

## 2012-08-17 NOTE — Assessment & Plan Note (Signed)
Will continue pt on ACE and control BS

## 2012-08-17 NOTE — Assessment & Plan Note (Signed)
Reason pt is here- for PT/rehab; apparently pt's thrombosis prophylaxis will be ASA 325 mg BID-PT ordered and a pull bar per pt request for using urinal independently-appt to see Dr Zollie Beckers in 2 weeks was ordered

## 2012-08-17 NOTE — Assessment & Plan Note (Signed)
Stable;will continue present meds

## 2012-08-17 NOTE — Assessment & Plan Note (Signed)
Stable-HbA1c was 7.7 a month ago-will continue present meds and order low carb diet

## 2012-08-17 NOTE — Progress Notes (Signed)
CSW continues to follow to assist with d/c planning. MD PN and CBC sent to [facility] as requested by [facility]. Awaiting decision regarding authorization for d/c to SNF today. Pt/family/ nsg/MD updated.  Werner Lean LCSW [phone]

## 2012-08-17 NOTE — Progress Notes (Signed)
Pt will be d/c to [facility] today via [facility] transport. [employer] has provided authorization for SNF. Pt/family/nsg/SNF have been alerted.  Werner Lean LCSW [phone]
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
## 2012-08-17 NOTE — Progress Notes (Signed)
MRN: [id number] Name: Joshua [name]  Sex: male Age: 72 y.o. DOB: 1940-10-03  [place] #: Helene Kelp Facility/Room:  303 Level Of Care: SNF Provider: Inocencio Homes D Emergency Contacts: Extended Emergency Contact Information Primary Emergency Contact: Zehring,[name] Address: [place], [place] [address] [place] Phone: [phone] Mobile Phone: [phone] Relation: Spouse  Code Status: FULL  Allergies: Demerol; Morphine and related; and Starlix  Chief Complaint  Patient presents with  . nursing home admission    HPI: Patient is 72 y.o. male who had total R hip arthroplasty and is here for rehab.  Past Medical History  Diagnosis Date  . DM type 2 (diabetes mellitus, type 2)   . Hypertension   . Tobacco dependence     Prior failure to quit with wellbutrin.  100+ pack-yr history  . Obesity   . Hyperplastic colon polyp 2001  . Erectile dysfunction     Normal testosterone  . Shortness of breath     exertion  . Adenomatous colon polyp 10/16/2011    Repeat 2018  . 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 pylori neg, no dysplasia or malignancy.  . Macular degeneration, dry     Mild, bilat (Optometrist, Mayford Knife at [facility] [place] in [place], [place])  . History of pneumonia     YRS AGO  . 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  . 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 COPD (chronic obstructive pulmonary disease)     Spirometry  2004 borderline obstruction  . Hyperlipidemia   . CKD (chronic kidney disease), stage I   . Diabetic nephropathy     Elevated urine microalb/cr 03/2011    Past Surgical History  Procedure Laterality Date  . Cholecystectomy open  1999  . Appendectomy  1957  . Hip surgery  age 72    Repairof slipped capital femoral epiphysis.  . Knee surgery  1979 or so   left; bolt + 3 screws to repair tib plateau fx  . Testicle surgery  as a child    Undescended testicle brought down into scrotum  . Tonsillectomy  1947  . Colonoscopy  10/16/2011    Procedure: COLONOSCOPY;  Surgeon: RInda Castle MD;  Location: [facility];  Service: Endoscopy;  Laterality: N/A;  . Cataract extraction w/ intraocular lens  implant, bilateral  04/08/2006 & 04/22/2006  . Esophagogastroduodenoscopy  02/25/12    Atrophic gastritis with a few erosions--capsule endoscopy planned as of 02/25/12 (Dr. KDeatra Ina.  . 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 Hardware removal Right 08/12/2012    Procedure: HARDWARE REMOVAL;  Surgeon: CMcarthur Rossetti MD;  Location: [facility];  Service: Orthopedics;  Laterality: Right;      Medication List       This list is accurate as of: 08/17/12  4:52 PM.  Always use your most recent med list.               aspirin 325 MG tablet  Take 325 mg by mouth 2 (two) times daily.     chlorthalidone 25 MG tablet  Commonly known as:  HYGROTON  Take 25 mg by mouth daily.     Chromium Picolinate 1000 MCG Tabs  Take 1 tablet by mouth daily.     ferrous sulfate 325 (65 FE) MG tablet  Take 325 mg by mouth daily with breakfast.     glimepiride 4 MG tablet  Commonly known as:  AMARYL  Take 4 mg by mouth 2 (two) times daily.     lovastatin 40 MG tablet  Commonly known as:  MEVACOR  Take 40 mg by mouth 2 (two) times daily.     metFORMIN 1000 MG tablet  Commonly known as:  GLUCOPHAGE  Take 1,000 mg by mouth 2 (two) times daily with a meal.     multivitamin with minerals Tabs tablet  Take 1 tablet by mouth daily.     oxyCODONE-acetaminophen 5-325 MG per tablet  Commonly known as:  ROXICET  Take 1-2 tablets by mouth every 4 (four) hours as needed for  pain.     pioglitazone 45 MG tablet  Commonly known as:  ACTOS  Take 1 tablet (45 mg total) by mouth daily.     ramipril 10 MG capsule  Commonly known as:  ALTACE  Take 10 mg by mouth 2 (two) times daily.     saxagliptin HCl 5 MG Tabs tablet  Commonly known as:  ONGLYZA  Take 1 tablet (5 mg total) by mouth daily.     Vitamin D-3 1000 UNITS Caps  Take 1 capsule by mouth daily.        Meds ordered this encounter  Medications  . aspirin 325 MG tablet    Sig: Take 325 mg by mouth 2 (two) times daily.    Immunization History  Administered Date(s) Administered  . Influenza Split 09/16/2011  . Tdap 12/16/2011    History  Substance Use Topics  . Smoking status: Current Every Day Smoker -- 1.00 packs/day for 60 years    Types: Cigarettes  . Smokeless tobacco: Current User     Comment: snoos 2 Pkg. a day  NOT SMOKED SINCE 07/18/12  . Alcohol Use: No    Family history is noncontributory    Review of Systems  DATA OBTAINED: from patient,   Pt requested pull bar so he can use urinal independently GENERAL: Feels well no fevers, fatigue, appetite changes SKIN: No itching, rash; incision is less painful today but still alittle warm EYES: No eye pain, redness, discharge EARS: No earache, tinnitus, change in hearing NOSE: No congestion, drainage or bleeding  MOUTH/THROAT: No mouth or tooth pain, No sore throat, No difficulty chewing or swallowing  RESPIRATORY: No cough, wheezing, SOB CARDIAC: No chest pain, palpitations, lower extremity edema  GI: No abdominal pain, No N/V/D or constipation, No heartburn or reflux  GU: No dysuria, frequency or urgency, or incontinence  MUSCULOSKELETAL: No unrelieved bone/joint pain NEUROLOGIC:no c/o PSYCHIATRIC: No overt anxiety or sadness. Sleeps well. No behavior issue.  AMBULATION:    Filed Vitals:   08/17/12 1639  BP: 134/66  Pulse: 91  Temp: 99.5 F (37.5 C)  Resp: 20    Physical Exam  GENERAL APPEARANCE: Alert, conversant.  Appropriately groomed. No acute distress.  SKIN: No diaphoresis rash; post surgical dressing R hip with mild warmth to and including knee but not red and not more tender than expected HEAD: Normocephalic, atraumatic  EYES: Conjunctiva/lids clear. Pupils round, reactive. EOMs intact.  EARS: External exam WNL, canals clear. Hearing grossly normal.  NOSE: No deformity or discharge.  MOUTH/THROAT:  Lips w/o lesions RESPIRATORY: Breathing is even, unlabored. Lung sounds are clear   CARDIOVASCULAR: Heart RRR no murmurs, rubs or gallops. trace edema R leg ARTERIAL: radial pulse 2+, DP pulse 1+  VENOUS: No varicosities. No venous stasis skin changes  GASTROINTESTINAL: Abdomen is soft, non-tender, not distended w/ normal bowel sounds.  MUSCULOSKELETAL: No abnormal joints or musculature NEUROLOGIC: Oriented X3. Cranial nerves 2-12 grossly intact. Moves all extremities no tremor. PSYCHIATRIC: Mood and affect appropriate to situation, no behavioral issues  Patient Active Problem List   Diagnosis Date Noted  . 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 Arthritis   . COPD (chronic obstructive pulmonary disease)   . CKD (chronic kidney disease), stage I   . Obesity   . Degenerative arthritis of hip 08/12/2012  . Preoperative clearance 07/07/2012  . Hyperlipidemia 03/16/2012  . Tobacco dependence   . Health maintenance examination 09/16/2011  . Iron deficiency anemia 09/02/2011  . Type II or unspecified type diabetes mellitus without mention of complication, uncontrolled 09/02/2011  . HTN (hypertension), benign 09/02/2011  . Prostate cancer screening 09/02/2011     CBC    Component Value Date/Time   WBC 5.3 08/17/2012 0400   RBC 3.03* 08/17/2012 0400   HGB 8.7* 08/17/2012 0400   HCT 26.2* 08/17/2012 0400   PLT 147* 08/17/2012 0400   MCV 86.5 08/17/2012 0400   LYMPHSABS 2.2 07/07/2012 1546   MONOABS 0.8 07/07/2012 1546   EOSABS 0.1 07/07/2012 1546   BASOSABS 0.0 07/07/2012 1546    CMP     Component  Value Date/Time   NA 134* 08/15/2012 0405   K 3.6 08/15/2012 0405   CL 99 08/15/2012 0405   CO2 26 08/15/2012 0405   GLUCOSE 253* 08/15/2012 0405   BUN 35* 08/15/2012 0405   CREATININE 0.80 08/15/2012 0405   CALCIUM 8.3* 08/15/2012 0405   PROT 7.2 03/18/2012 0947   ALBUMIN 4.0 03/18/2012 0947   AST 21 03/18/2012 0947   ALT 15 03/18/2012 0947   ALKPHOS 57 03/18/2012 0947   BILITOT 1.0 03/18/2012 0947   GFRNONAA 87* 08/15/2012 0405   GFRAA >90 08/15/2012 0405    Assessment and Plan  HTN (hypertension), benign Stable;will continue present meds  Type II or unspecified type diabetes mellitus without mention of complication, uncontrolled Stable-HbA1c was 7.7 a month ago-will continue present meds and order low carb diet  CKD (chronic kidney disease), stage I Will continue pt on ACE and control BS  Iron deficiency anemia Known problem;pt is on iron-and is now s/p surgery-will recheck CBC Monday 8/18  S/P total hip arthroplasty Reason pt is here- for PT/rehab; apparently pt's thrombosis prophylaxis will be ASA 325 mg BID-PT ordered and a pull bar per pt request for using urinal independently-appt to see Dr Zollie Beckers in 2 weeks was ordered    Hennie Duos, MD

## 2012-08-17 NOTE — Discharge Summary (Signed)
  No change in patient status. Patient was unable to be discharged yesterday to SNF for insurance reasons. Remains stable and ready for discharge to SNF.

## 2012-08-17 NOTE — Progress Notes (Signed)
Subjective: 5 Days Post-Op Procedure(s) (LRB): REMOVAL OF OLD PINS RIGHT HIP AND RIGHT TOTAL HIP ARTHROPLASTY ANTERIOR APPROACH (Right) HARDWARE REMOVAL (Right) Patient reports pain as moderate to severe with activity. Denies chest pain, SOB, dizziness or lightheadedness.  Objective: Vital signs in last 24 hours: Temp:  [98.6 F (37 C)-99.7 F (37.6 C)] 98.6 F (37 C) (08/13 0458) Pulse Rate:  [80-93] 80 (08/13 0458) Resp:  [15-18] 15 (08/13 0800) BP: (120-133)/(50-68) 127/50 mmHg (08/13 0458) SpO2:  [94 %-98 %] 98 % (08/13 0458)  Intake/Output from previous day: 08/12 0701 - 08/13 0700 In: 720 [P.O.:720] Out: 2100 [Urine:2100] Intake/Output this shift:     Recent Labs  08/15/12 0405 08/17/12 0400  HGB 8.6* 8.7*    Recent Labs  08/15/12 0405 08/17/12 0400  WBC 4.8 5.3  RBC 3.03* 3.03*  HCT 26.3* 26.2*  PLT 95* 147*    Recent Labs  08/15/12 0405  NA 134*  K 3.6  CL 99  CO2 26  BUN 35*  CREATININE 0.80  GLUCOSE 253*  CALCIUM 8.3*   No results found for this basename: LABPT, INR,  in the last 72 hours  Right lower extremity: Calf supple non tender Dorsi/plantar flexion right ankle intact incision with moderate serous anginous drainage no erythema   Assessment/Plan: 5 Days Post-Op Procedure(s) (LRB): REMOVAL OF OLD PINS RIGHT HIP AND RIGHT TOTAL HIP ARTHROPLASTY ANTERIOR APPROACH (Right) HARDWARE REMOVAL (Right) Up with therapy Discharge to [facility], [place] 08/17/2012, 8:59 AM

## 2012-08-18 NOTE — Progress Notes (Signed)
Clinical Social Work Department CLINICAL SOCIAL WORK PLACEMENT NOTE 08/18/2012  Patient:  Joshua [name], Joshua [name]  Account Number:  [id number] Admit date:  08/12/2012  Clinical Social Worker:  Levie Heritage  Date/time:  08/14/2012 03:21 PM  Clinical Social Work is seeking post-discharge placement for this patient at the following level of care:   SKILLED NURSING   (*CSW will update this form in Epic as items are completed)   08/14/2012  Patient/family provided with [facility] Department of Clinical Social Work's list of facilities offering this level of care within the geographic area requested by the patient (or if unable, by the patient's family).  08/14/2012  Patient/family informed of their freedom to choose among providers that offer the needed level of care, that participate in Medicare, Medicaid or managed care program needed by the patient, have an available bed and are willing to accept the patient.  08/14/2012  Patient/family informed of [facility]' ownership interest in [facility], as well as of the fact that they are under no obligation to receive care at this facility.  PASARR submitted to EDS on 08/14/2012 PASARR number received from EDS on 08/14/2012  FL2 transmitted to all facilities in geographic area requested by pt/family on  08/14/2012 FL2 transmitted to all facilities within larger geographic area on   Patient informed that his/her managed care company has contracts with or will negotiate with  certain facilities, including the following:     Patient/family informed of bed offers received:  08/15/2012 Patient chooses bed at [facility] Physician recommends and patient chooses bed at    Patient to be transferred to [place] on  08/17/2012 Patient to be transferred to facility by [facility]  The following physician request were entered in Epic:   Additional Comments: Pt / family are aware that insurance  may not cover cost of ambulance transport.  Werner Lean LCSW

## 2012-08-18 NOTE — Progress Notes (Signed)
Discharge summary sent to payer through [facility]  

## 2012-08-31 ENCOUNTER — Non-Acute Institutional Stay (SKILLED_NURSING_FACILITY): Payer: Medicare Other | Admitting: Nurse Practitioner

## 2012-08-31 DIAGNOSIS — I10 Essential (primary) hypertension: Secondary | ICD-10-CM

## 2012-08-31 DIAGNOSIS — D509 Iron deficiency anemia, unspecified: Secondary | ICD-10-CM

## 2012-08-31 DIAGNOSIS — M169 Osteoarthritis of hip, unspecified: Secondary | ICD-10-CM

## 2012-08-31 NOTE — Progress Notes (Signed)
Patient ID: Joshua [name], male   DOB: 09/08/1940, 72 y.o.   MRN: [id number]   PCP: Tammi Sou, MD    Allergies  Allergen Reactions  . Demerol [Meperidine] Nausea Only  . Morphine And Related Other (See Comments)    Drenched with perspiration  . Starlix [Nateglinide] Other (See Comments)    gassy    Chief Complaint  Patient presents with  . Discharge Note    HPI:  72 y.o. male who was to the underwent operative treatment of Degenerative arthritis of hip with a right total hip arthroplasty. Pt did well during hospitalization and was at [facility] for STR. Patient currently doing well with therapy, now stable to discharge home with home health. Pt without complaints during visit. Reports pain is well controlled; he only needs pain medication occasionally when he "over does it."  He will be returning home with wife and has no concerns regarding going home or medical complaints at this time.   Review of Systems:  Review of Systems  Constitutional: Negative for fever, chills and malaise/fatigue.  Respiratory: Negative.  Negative for cough and shortness of breath.   Cardiovascular: Negative.  Negative for chest pain, palpitations and leg swelling.  Gastrointestinal: Negative for abdominal pain, diarrhea and constipation.  Genitourinary: Negative for dysuria, urgency and frequency.  Musculoskeletal: Negative for myalgias, back pain and joint pain.  Neurological: Negative for dizziness, tingling, weakness and headaches.     Past Medical History  Diagnosis Date  . DM type 2 (diabetes mellitus, type 2)   . Hypertension   . Tobacco dependence     Prior failure to quit with wellbutrin.  100+ pack-yr history  . Obesity   . Hyperplastic colon polyp 2001  . Erectile dysfunction     Normal testosterone  . Shortness of breath     exertion  . Adenomatous colon polyp 10/16/2011    Repeat 2018  . 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 pylori  neg, no dysplasia or malignancy.  . Macular degeneration, dry     Mild, bilat (Optometrist, Mayford Knife at [facility] [place] in [place], [place])  . History of pneumonia     YRS AGO  . Iron deficiency anemia 2014    03/2012 capsule endoscopy showed 2 AVMs--likely responsible for his IDA--lifetime iron supp recommended + q67moCBCs.  . 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 COPD (chronic obstructive pulmonary disease)     Spirometry  2004 borderline obstruction  . Hyperlipidemia   . CKD (chronic kidney disease), stage I   . Diabetic nephropathy     Elevated urine microalb/cr 03/2011   Past Surgical History  Procedure Laterality Date  . Cholecystectomy open  1999  . Appendectomy  1957  . Hip surgery  age 72    Repairof slipped capital femoral epiphysis.  . Knee surgery  1979 or so    left; bolt + 3 screws to repair tib plateau fx  . Testicle surgery  as a child    Undescended testicle brought down into scrotum  . Tonsillectomy  1947  . Colonoscopy  10/16/2011    Procedure: COLONOSCOPY;  Surgeon: RInda Castle MD;  [facility];  Service: Endoscopy;  Laterality: N/A;  . Cataract extraction w/ intraocular lens  implant, bilateral  04/08/2006 & 04/22/2006  . Esophagogastroduodenoscopy  02/25/12    Atrophic gastritis with a few erosions--capsule endoscopy planned as of 02/25/12 (Dr. KDeatra Ina.  .[name]  Total hip arthroplasty Right 08/12/2012    Procedure: REMOVAL OF OLD PINS RIGHT HIP AND RIGHT TOTAL HIP ARTHROPLASTY ANTERIOR APPROACH;  Surgeon: Mcarthur Rossetti, MD;  Location: [facility];  Service: Orthopedics;  Laterality: Right;  . Hardware removal Right 08/12/2012    Procedure: HARDWARE REMOVAL;  Surgeon: Mcarthur Rossetti, MD;  Location: [facility];  Service: Orthopedics;  Laterality: Right;   Social History:   reports that he has been smoking Cigarettes.  He has a 60 pack-year smoking history. He uses smokeless tobacco. He reports that he does not drink alcohol or use illicit  drugs.  Family History  Problem Relation Age of Onset  . Breast cancer Sister   . Diabetes Maternal Uncle   . Diabetes Paternal Grandmother   . Heart disease Father   . Heart disease Maternal Uncle     Medications: Patient's Medications  New Prescriptions   No medications on file  Previous Medications   ASPIRIN 325 MG TABLET    Take 325 mg by mouth 2 (two) times daily.   CHLORTHALIDONE (HYGROTON) 25 MG TABLET    Take 25 mg by mouth daily.   CHOLECALCIFEROL (VITAMIN D-3) 1000 UNITS CAPS    Take 1 capsule by mouth daily.   CHROMIUM PICOLINATE 1000 MCG TABS    Take 1 tablet by mouth daily.   FERROUS SULFATE 325 (65 FE) MG TABLET    Take 325 mg by mouth daily with breakfast.   GLIMEPIRIDE (AMARYL) 4 MG TABLET    Take 4 mg by mouth 2 (two) times daily.   LOVASTATIN (MEVACOR) 40 MG TABLET    Take 40 mg by mouth 2 (two) times daily.   METFORMIN (GLUCOPHAGE) 1000 MG TABLET    Take 1,000 mg by mouth 2 (two) times daily with a meal.   MULTIPLE VITAMIN (MULTIVITAMIN WITH MINERALS) TABS    Take 1 tablet by mouth daily.   OXYCODONE-ACETAMINOPHEN (ROXICET) 5-325 MG PER TABLET    Take 1-2 tablets by mouth every 4 (four) hours as needed for pain.   PIOGLITAZONE (ACTOS) 45 MG TABLET    Take 1 tablet (45 mg total) by mouth daily.   RAMIPRIL (ALTACE) 10 MG CAPSULE    Take 10 mg by mouth 2 (two) times daily.   SAXAGLIPTIN HCL (ONGLYZA) 5 MG TABS TABLET    Take 1 tablet (5 mg total) by mouth daily.  Modified Medications   No medications on file  Discontinued Medications   No medications on file     Physical Exam:  Filed Vitals:   08/31/12 1048  BP: 114/57  Pulse: 68  Temp: 98 F (36.7 C)  Resp: 20   GENERAL APPEARANCE: Alert, conversant. Appropriately groomed. No acute distress.  SKIN: well- healed right hip incision, no heat, swelling, or drainage HEAD: Normocephalic, atraumatic  EYES: Conjunctiva/lids clear. Pupils round, reactive. EOMs intact.  EARS: External exam WNL, Hearing grossly  normal.  NOSE: No deformity or discharge.  MOUTH/THROAT: Lips w/o lesions. Mouth and throat normal. Tongue moist, w/o lesion.  NECK: No thyroid tenderness, enlargement or nodule  RESPIRATORY: Breathing is even, unlabored. Lung sounds are clear   CARDIOVASCULAR: Heart RRR no murmurs, rubs or gallops. Trace peripheral edema.  ARTERIAL: radial pulse 2+, DP pulse 1+  VENOUS: No varicosities. No venous stasis skin changes  GASTROINTESTINAL: Abdomen is soft, non-tender, not distended w/ normal bowel sounds. No mass, hernias or organomegally GENITOURINARY: Bladder non tender, not distended  MUSCULOSKELETAL: No abnormal joints or musculature NEUROLOGIC: Oriented X3. Cranial  nerves 2-12 grossly intact. Moves all extremities no tremor. PSYCHIATRIC: Mood and affect appropriate to situation, no behavioral issues    Labs reviewed: Basic Metabolic Panel:  Recent Labs  08/04/12 1340 08/13/12 0413 08/15/12 0405  NA 140 135 134*  K 4.5 3.9 3.6  CL 102 99 99  CO2 _0 GLUCOSE 164* 247* 253*  BUN 23 18 35*  CREATININE 0.69 0.88 0.80  CALCIUM 9.5 8.4 8.3*   Liver Function Tests:  Recent Labs  03/18/12 0947  AST 21  ALT 15  ALKPHOS 57  BILITOT 1.0  PROT 7.2  ALBUMIN 4.0   No results found for this basename: LIPASE, AMYLASE,  in the last 8760 hours No results found for this basename: AMMONIA,  in the last 8760 hours CBC:  Recent Labs  12/16/11 1434 03/18/12 0947 07/07/12 1546  08/14/12 0659 08/15/12 0405 08/17/12 0400  WBC 8.0 9.0 7.9  < > 5.2 4.8 5.3  NEUTROABS 5.4 6.1 4.8  --   --   --   --   HGB 13.2 14.6 14.8  < > 9.7* 8.6* 8.7*  HCT 40.1 44.1 44.3  < > 29.5* 26.3* 26.2*  MCV 78.3 85.3 88.3  < > 87.8 86.8 86.5  PLT 174.0 182.0 155.0  < > 109* 95* 147*  < > = values in this interval not displayed. Cardiac Enzymes: No results found for this basename: CKTOTAL, CKMB, CKMBINDEX, TROPONINI,  in the last 8760 hours BNP: No components found with this basename: POCBNP,   CBG:  Recent Labs  08/16/12 2215 08/17/12 0710 08/17/12 1215  GLUCAP 211* 163* 248*   CBC NO Diff (Complete Blood Count)       Result: 08/22/2012 2:07 PM    ( Status: F )            WBC  6.9        4.0-10.5  K/uL  SLN       RBC  3.26     L  4.22-5.81  MIL/uL  SLN       Hemoglobin  9.2     L  13.0-17.0  g/dL  SLN       Hematocrit  28.0     L  39.0-52.0  %  SLN       MCV  85.9        78.0-100.0  fL  SLN       MCH  28.2        26.0-34.0  pg  SLN       MCHC  32.9        30.0-36.0  g/dL  SLN       RDW  14.4        11.5-15.5  %  SLN       Platelet Count  293     Assessment/Plan HTN (hypertension), benign Stable to cont currnet medications  Iron deficiency anemia Stable to cont iron- follow up cbc as outpt  Diabetes Stable to cont current medications  Degenerative arthritis of hip S/p right total hip pt is stable for discharge-will need PT/OT per home health. No DME needed. Rx written for a 1 month supply of medication.  will need to follow up with PCP within 1-2 weeks.

## 2012-09-01 ENCOUNTER — Ambulatory Visit (INDEPENDENT_AMBULATORY_CARE_PROVIDER_SITE_OTHER): Payer: Medicare Other | Admitting: Family Medicine

## 2012-09-01 ENCOUNTER — Encounter: Payer: Self-pay | Admitting: Family Medicine

## 2012-09-01 VITALS — BP 143/68 | HR 75 | Temp 98.5°F | Resp 18 | Ht 71.0 in | Wt 201.0 lb

## 2012-09-01 DIAGNOSIS — D62 Acute posthemorrhagic anemia: Secondary | ICD-10-CM

## 2012-09-01 DIAGNOSIS — I10 Essential (primary) hypertension: Secondary | ICD-10-CM

## 2012-09-01 LAB — CBC WITH DIFFERENTIAL/PLATELET
Basophils Absolute: 0 10*3/uL (ref 0.0–0.1)
Eosinophils Absolute: 0.1 10*3/uL (ref 0.0–0.7)
Lymphocytes Relative: 14.7 % (ref 12.0–46.0)
MCHC: 33.3 g/dL (ref 30.0–36.0)
Neutrophils Relative %: 75.1 % (ref 43.0–77.0)
RDW: 15.8 % — ABNORMAL HIGH (ref 11.5–14.6)

## 2012-09-01 MED ORDER — LOVASTATIN 40 MG PO TABS: 40.0000 mg | ORAL_TABLET | Freq: Two times a day (BID) | ORAL | Status: DC

## 2012-09-01 MED ORDER — METFORMIN HCL 1000 MG PO TABS: 1000.0000 mg | ORAL_TABLET | Freq: Two times a day (BID) | ORAL | Status: DC

## 2012-09-01 MED ORDER — GLIMEPIRIDE 4 MG PO TABS: 4.0000 mg | ORAL_TABLET | Freq: Two times a day (BID) | ORAL | Status: DC

## 2012-09-01 MED ORDER — RAMIPRIL 10 MG PO CAPS: 10.0000 mg | ORAL_CAPSULE | Freq: Two times a day (BID) | ORAL | Status: DC

## 2012-09-01 MED ORDER — HYDROCODONE-ACETAMINOPHEN 5-325 MG PO TABS: ORAL_TABLET | ORAL | Status: DC

## 2012-09-01 NOTE — Patient Instructions (Signed)
Increase your iron tab (ferrous sulfate) to one tab TWICE per day with orange juice. Follow low sodium diet.

## 2012-09-01 NOTE — Progress Notes (Signed)
OFFICE NOTE  09/01/2012  CC:  Chief Complaint  Patient presents with  . Hospitalization Follow-up    R hip replacement      HPI: Patient is a 72 y.o. Caucasian male who is here for f/u s/p right THR on 08/12/12 by Dr. Ninfa Linden. Surgery went well.  Post-op anemia noted but no transfusion was required.  Taking iron sulf 377m qd w/out side effect. [facility] [address] and was just d/c'd from there yesterday. Staples are out of wound and it has been checked by ortho x 1 but no ortho f/u now until 09/26/12 per pt. He is still in moderate pain but taking only tylenol.  HH nursing/PT being arranged according to pt report today.  Some med confusion that we had to sort through today but in the end it appears none of his outpt rx's were changed or stopped and only the iron tab was added to this regimen.  LE's swelling present bilat, R>L, uses only a little salt on food but doesn't follow "low sodium diet".  Pertinent PMH:  Past Medical History  Diagnosis Date  . DM type 2 (diabetes mellitus, type 2)   . Hypertension   . Tobacco dependence     Prior failure to quit with wellbutrin.  100+ pack-yr history  . Obesity   . Hyperplastic colon polyp 2001  . Erectile dysfunction     Normal testosterone  . Shortness of breath     exertion  . Adenomatous colon polyp 10/16/2011    Repeat 2018  . 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 pylori neg, no dysplasia or malignancy.  . Macular degeneration, dry     Mild, bilat (Optometrist, [place] [place]  . History of pneumonia     YRS AGO  . 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  . 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 COPD (chronic obstructive pulmonary disease)     Spirometry  2004 borderline obstruction  . Hyperlipidemia    . CKD (chronic kidney disease), stage I   . Diabetic nephropathy     Elevated urine microalb/cr 03/2011   Past Surgical History  Procedure Laterality Date  . Cholecystectomy open  1999  . Appendectomy  1957  . Hip surgery  age 72    Repairf slipped capital femoral epiphysis.  . Knee surgery  1979 or so    left; bolt + 3 screws to repair tib plateau fx  . Testicle surgery  as a child    Undescended testicle brought down into scrotum  . Tonsillectomy  1947  . Colonoscopy  10/16/2011    Procedure: COLONOSCOPY;  Surgeon: RoInda CastleMD;  Location: [facility];  Service: Endoscopy;  Laterality: N/A;  . Cataract extraction w/ intraocular lens  implant, bilateral  04/08/2006 & 04/22/2006  . Esophagogastroduodenoscopy  02/25/12    Atrophic gastritis with a few erosions--capsule endoscopy planned as of 02/25/12 (Dr. KaDeatra Ina  . 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 Hardware removal Right 08/12/2012    Procedure: HARDWARE REMOVAL;  Surgeon: ChMcarthur RossettiMD;  Location: [facility];  Service: Orthopedics;  Laterality: Right;    MEDS:  Outpatient Prescriptions Prior to Visit  Medication Sig Dispense Refill  . chlorthalidone (HYGROTON) 25 MG tablet Take 25 mg by mouth daily.      . Cholecalciferol (VITAMIN D-3) 1000 UNITS CAPS Take 1 capsule by mouth daily.      . Chromium Picolinate 1000 MCG TABS Take 1 tablet by mouth daily.      . ferrous sulfate 325 (65 FE) MG tablet Take 325 mg by mouth daily with breakfast.      . glimepiride (AMARYL) 4 MG tablet Take 4 mg by mouth 2 (two) times daily.      [name] lovastatin (MEVACOR) 40 MG tablet Take 40 mg by mouth 2 (two) times daily.      . metFORMIN (GLUCOPHAGE) 1000 MG tablet Take 1,000 mg by mouth 2 (two) times daily with a meal.      . Multiple  Vitamin (MULTIVITAMIN WITH MINERALS) TABS Take 1 tablet by mouth daily.      . pioglitazone (ACTOS) 45 MG tablet Take 1 tablet (45 mg total) by mouth daily.  30 tablet  6  . ramipril (ALTACE) 10 MG capsule Take 10 mg by mouth 2 (two) times daily.      . saxagliptin HCl (ONGLYZA) 5 MG TABS tablet Take 1 tablet (5 mg total) by mouth daily.  30 tablet  6  . oxyCODONE-acetaminophen (ROXICET) 5-325 MG per tablet Take 1-2 tablets by mouth every 4 (four) hours as needed for pain.  240 tablet  0  . aspirin 325 MG tablet Take 325 mg by mouth 2 (two) times daily.       No facility-administered medications prior to visit.    PE: Blood pressure 143/68, pulse 75, temperature 98.5 F (36.9 C), temperature source Temporal, resp. rate 18, height _0  (1.803 m), weight 201 lb (91.173 kg), SpO2 95.00%. Gen: Alert, well appearing.  Patient is oriented to person, place, time, and situation. Mild generalized pallor. CV: RRR Chest is clear, no wheezing or rales. Normal symmetric air entry throughout both lung fields. No chest wall deformities or tenderness. LE's: 2+ pitting edema bilat from knees down into feet, R>L, without rash or tense skin. SKIN: right hip wounds c/d/i  IMPRESSION AND PLAN:  1) Three weeks s/p right THR: doing pretty darn good.  Moderate pain, will rx vicodin 5/325, 1-2 q6h prn, #120, no RF.  Therapeutic expectations and side effect profile of medication discussed today.  Patient's questions answered. If stronger med required I told pt I would rather he ask for this through ortho since it is post op pain that they should know about.  2) Post-op acute blood loss anemia: last Hb was 8.7 in hosp. REcheck CBC today.  I recommended he increase his iron sulfate to 343m bid with orange juice.  3) DM 2 and HTN: continue current meds.  Checking Cr/lytes today.  4) LE venous insuff edema: worse in post op/inactive period to be expected. Stressed importance of low Na diet.  Unable to wear  compresson hose or elevate legs at this time due to hip. No diuretics unless skin is becoming tense/nearing breakdown.   FOLLOW UP: 1 mo

## 2012-09-02 LAB — BASIC METABOLIC PANEL
Chloride: 104 mEq/L (ref 96–112)
Creatinine, Ser: 0.9 mg/dL (ref 0.4–1.5)
GFR: 93.81 mL/min (ref >60.00)
Potassium: 4.7 mEq/L (ref 3.5–5.1)

## 2012-09-30 ENCOUNTER — Other Ambulatory Visit: Payer: Self-pay | Admitting: Family Medicine

## 2012-09-30 MED ORDER — CHLORTHALIDONE 25 MG PO TABS: 25.0000 mg | ORAL_TABLET | Freq: Every day | ORAL | Status: DC

## 2012-10-03 ENCOUNTER — Ambulatory Visit (INDEPENDENT_AMBULATORY_CARE_PROVIDER_SITE_OTHER): Payer: Medicare Other | Admitting: Family Medicine

## 2012-10-03 ENCOUNTER — Encounter: Payer: Self-pay | Admitting: Family Medicine

## 2012-10-03 VITALS — BP 156/77 | HR 80 | Temp 98.2°F | Resp 16 | Ht 71.0 in | Wt 202.0 lb

## 2012-10-03 DIAGNOSIS — R918 Other nonspecific abnormal finding of lung field: Secondary | ICD-10-CM

## 2012-10-03 DIAGNOSIS — Z96649 Presence of unspecified artificial hip joint: Secondary | ICD-10-CM

## 2012-10-03 DIAGNOSIS — F17200 Nicotine dependence, unspecified, uncomplicated: Secondary | ICD-10-CM

## 2012-10-03 DIAGNOSIS — Z23 Encounter for immunization: Secondary | ICD-10-CM

## 2012-10-03 DIAGNOSIS — R9389 Abnormal findings on diagnostic imaging of other specified body structures: Secondary | ICD-10-CM | POA: Insufficient documentation

## 2012-10-03 DIAGNOSIS — D62 Acute posthemorrhagic anemia: Secondary | ICD-10-CM

## 2012-10-03 DIAGNOSIS — IMO0001 Reserved for inherently not codable concepts without codable children: Secondary | ICD-10-CM

## 2012-10-03 MED ORDER — LIRAGLUTIDE 18 MG/3ML ~~LOC~~ SOPN: 1.2000 mg | PEN_INJECTOR | Freq: Every day | SUBCUTANEOUS | Status: DC

## 2012-10-03 NOTE — Assessment & Plan Note (Signed)
Recovering well, requiring no narcotics for pain. Ortho has released him to return to work.

## 2012-10-03 NOTE — Assessment & Plan Note (Signed)
Quit briefly for his hip surg. Restarted--1 pack per day.  Not planning on quitting at this time. Encouraged pt to quit.

## 2012-10-03 NOTE — Assessment & Plan Note (Addendum)
D/C onglyza. Start victoza 0.51m SQ qAM x 7d then increase to 1.2 mg SQ qd. Continue monitoring gluc at least fasting qAM at home. Foot exam done today: slight area of diminished sensation on right, o/w normal. Pneumovax today.

## 2012-10-03 NOTE — Assessment & Plan Note (Signed)
This was superimposed on hx of iron def anemia: last hb check excellent. Continue one OTC feso4 tab qd. Recheck cbc and IBC/ferritin next o/v.

## 2012-10-03 NOTE — Patient Instructions (Addendum)
Take 0.81m Victoza once daily for 7d, then increase this to 1.2 mg once daily.

## 2012-10-03 NOTE — Progress Notes (Signed)
OFFICE NOTE  10/03/2012  CC:  Chief Complaint  Patient presents with  . Diabetes  . Hypertension     HPI: Patient is a 72 y.o. Caucasian male who is here for 1 mo f/u post-op anemia and right hip pain s/p THR about 89moago. Last visit his Hb was 11.4--markedly improved from his post-op measurement so I recommended he continue on one iron tab per day. Doing much better-just some pressure in right mid femur region but no pain. Takes tylenol bid but nothing stronger.  Ortho f/u last week went well and he was released to return to work this week.  He denies burning, tingling, or numbness in feet. Says gluc checks qAM avg 160-180 range.  Pertinent PMH:  Past Medical History  Diagnosis Date  . DM type 2 (diabetes mellitus, type 2)   . Hypertension   . Tobacco dependence     Prior failure to quit with wellbutrin.  100+ pack-yr history.  Quit for 1 mo to get hip replacement (1 pack/day)  . Obesity   . Hyperplastic colon polyp 2001  . Erectile dysfunction     Normal testosterone  . Shortness of breath     exertion  . Adenomatous colon polyp 10/16/2011    Repeat 2018  . 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 pylori neg, no dysplasia or malignancy.  . Macular degeneration, dry     Mild, bilat (Optometrist, [place] [employer]  . History of pneumonia     YRS AGO  . Iron deficiency anemia 2014    03/2012 capsule endoscopy showed 2 AVMs--likely responsible for his IDA--lifetime iron supp recommended + q638moBCs.  . 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 COPD (chronic obstructive pulmonary disease)     Spirometry  2004 borderline obstruction  . Hyperlipidemia   . CKD (chronic kidney disease), stage I   . Diabetic nephropathy     Elevated urine microalb/cr 03/2011   Past surgical, social, and family history reviewed and no changes noted since last office visit.  MEDS:  Outpatient Prescriptions Prior to Visit   Medication Sig Dispense Refill  . aspirin 81 MG tablet Take 81 mg by mouth daily.      . chlorthalidone (HYGROTON) 25 MG tablet Take 1 tablet (25 mg total) by mouth daily.  30 tablet  1  . Cholecalciferol (VITAMIN D-3) 1000 UNITS CAPS Take 1 capsule by mouth daily.      . Chromium Picolinate 1000 MCG TABS Take 1 tablet by mouth daily.      . ferrous sulfate 325 (65 FE) MG tablet Take 325 mg by mouth daily with breakfast.      . glimepiride (AMARYL) 4 MG tablet Take 1 tablet (4 mg total) by mouth 2 (two) times daily.  180 tablet  1  . lovastatin (MEVACOR) 40 MG tablet Take 1 tablet (40 mg total) by mouth 2 (two) times daily.  180 tablet  1  . metFORMIN (GLUCOPHAGE) 1000 MG tablet Take 1 tablet (1,000 mg total) by mouth 2 (two) times daily with a meal.  180 tablet  1  . Multiple Vitamin (MULTIVITAMIN WITH MINERALS) TABS Take 1 tablet by mouth daily.      . pioglitazone (ACTOS) 45 MG tablet Take 1 tablet (45 mg total) by mouth daily.  30 tablet  6  . ramipril (ALTACE) 10 MG capsule Take 1 capsule (10 mg total) by  mouth 2 (two) times daily.  180 capsule  1  . saxagliptin HCl (ONGLYZA) 5 MG TABS tablet Take 1 tablet (5 mg total) by mouth daily.  30 tablet  6  . HYDROcodone-acetaminophen (NORCO/VICODIN) 5-325 MG per tablet 1-2 tabs po q6h prn pain  120 tablet  0   No facility-administered medications prior to visit.    PE: Blood pressure 156/77, pulse 80, temperature 98.2 F (36.8 C), temperature source Temporal, resp. rate 16, height _0  (1.803 m), weight 202 lb (91.627 kg), SpO2 98.00%. Gen: Alert, well appearing.  Patient is oriented to person, place, time, and situation. Legs: 2+ pitting edema in right lower leg, without rash/erythema/tenderness Trace pretibial edema on left, 1-2 + pitting in left ankle, no rash/erythema/tenderness. Foot exam - bilateral normal; no swelling, tenderness or skin or vascular lesions. Color and temperature is normal. Sensation is intact except for area diffuse  across right metatarsal head region (plantar surface). Peripheral pulses are palpable. Toenails are thick and brittle.   IMPRESSION AND PLAN:  Type II or unspecified type diabetes mellitus without mention of complication, uncontrolled D/C onglyza. Start victoza 0.19m SQ qAM x 7d then increase to 1.2 mg SQ qd. Continue monitoring gluc at least fasting qAM at home. Foot exam done today: slight area of diminished sensation on right, o/w normal. Pneumovax today.  Tobacco dependence Quit briefly for his hip surg. Restarted--1 pack per day.  Not planning on quitting at this time. Encouraged pt to quit.  S/P total hip arthroplasty Recovering well, requiring no narcotics for pain. Ortho has released him to return to work.  Acute blood loss anemia This was superimposed on hx of iron def anemia: last hb check excellent. Continue one OTC feso4 tab qd. Recheck cbc and IBC/ferritin next o/v.  Abnormal chest x-ray IMPRESSION:  Hyperinflation configuration.  There are areas of increased reticular markings throughout the  lungs which have increased since previous study particularly in the  right perihilar region and in the right lung base. This may reflect  progression of chronic pulmonary fibrotic process or interstitial  pneumonitis. There is also central peribronchial thickening. If  further imaging evaluation is felt clinically indicated, CT of the  chest would be suggested.  Stable appearance of small nodular density in right lung base felt  to be most likely calcified granuloma. No evidence of active  granulomatous process is seen.  Patient denies prob with cough, hemoptysis, or SOB at this time.  Will do no further w/u of this abnl CXR at this time.  Pt ok with this approach.   Flu vaccine IM today.  FOLLOW UP:  approx 2 mo

## 2012-10-03 NOTE — Assessment & Plan Note (Signed)
IMPRESSION:  Hyperinflation configuration.  There are areas of increased reticular markings throughout the  lungs which have increased since previous study particularly in the  right perihilar region and in the right lung base. This may reflect  progression of chronic pulmonary fibrotic process or interstitial  pneumonitis. There is also central peribronchial thickening. If  further imaging evaluation is felt clinically indicated, CT of the  chest would be suggested.  Stable appearance of small nodular density in right lung base felt  to be most likely calcified granuloma. No evidence of active  granulomatous process is seen.  Patient denies prob with cough, hemoptysis, or SOB at this time.  Will do no further w/u of this abnl CXR at this time.  Pt ok with this approach.

## 2012-11-07 ENCOUNTER — Encounter: Payer: Self-pay | Admitting: Family Medicine

## 2012-11-07 ENCOUNTER — Ambulatory Visit: Payer: PRIVATE HEALTH INSURANCE | Admitting: Family Medicine

## 2012-11-07 ENCOUNTER — Ambulatory Visit (INDEPENDENT_AMBULATORY_CARE_PROVIDER_SITE_OTHER): Payer: Medicare Other | Admitting: Family Medicine

## 2012-11-07 VITALS — BP 135/71 | HR 81 | Temp 98.0°F | Resp 18 | Ht 71.0 in | Wt 198.0 lb

## 2012-11-07 DIAGNOSIS — R918 Other nonspecific abnormal finding of lung field: Secondary | ICD-10-CM

## 2012-11-07 DIAGNOSIS — R9389 Abnormal findings on diagnostic imaging of other specified body structures: Secondary | ICD-10-CM

## 2012-11-07 NOTE — Progress Notes (Signed)
OFFICE NOTE  11/07/2012  CC:  Chief Complaint  Patient presents with  . Follow-up     HPI: Patient is a 72 y.o. Caucasian male who is here for 1 mo f/u DM 2. Last visit I d/c'd his onglyza and started him on victoza with instructions to titrate to 1.2 mg SQ qd. Fasting glucose 158 recently, says this is compared to about 200 prior to being on victoza.  Currently still smoking 1/2-1 pack per day and not contemplating quitting. Recent PFT's through his employer showed FEV1 65% and FVC 56%.   Denies chest tightness, SOB, or wheezing.  Very little cough.  He is no longer taking any rx pain medication for his hip or any other pain.  Pertinent PMH:  Past Medical History  Diagnosis Date  . DM type 2 (diabetes mellitus, type 2)   . Hypertension   . Tobacco dependence     Prior failure to quit with wellbutrin.  100+ pack-yr history.  Quit for 1 mo to get hip replacement (1 pack/day)  . Obesity   . Hyperplastic colon polyp 2001  . Erectile dysfunction     Normal testosterone  . Shortness of breath     exertion  . Adenomatous colon polyp 10/16/2011    Repeat 2018  . 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 pylori neg, no dysplasia or malignancy.  . Macular degeneration, dry     Mild, bilat (Optometrist, Mayford Knife at [employer] in [place], [place])  . History of pneumonia     YRS AGO  . Iron deficiency anemia 2014    03/2012 capsule endoscopy showed 2 AVMs--likely responsible for his IDA--lifetime iron supp recommended + q23moCBCs.  . 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 COPD (chronic obstructive pulmonary disease)     Spirometry  2004 borderline obstruction  . Hyperlipidemia   . CKD (chronic kidney disease), stage I   . Diabetic nephropathy     Elevated urine microalb/cr 03/2011   Past Surgical History  Procedure Laterality Date  . Cholecystectomy open  1999  . Appendectomy  1957  . Hip surgery  age 89    Repairof slipped  capital femoral epiphysis.  . Knee surgery  1979 or so    left; bolt + 3 screws to repair tib plateau fx  . Testicle surgery  as a child    Undescended testicle brought down into scrotum  . Tonsillectomy  1947  . Colonoscopy  10/16/2011    Procedure: COLONOSCOPY;  Surgeon: RInda Castle MD;  Location: [facility];  Service: Endoscopy;  Laterality: N/A;  . Cataract extraction w/ intraocular lens  implant, bilateral  04/08/2006 & 04/22/2006  . Esophagogastroduodenoscopy  02/25/12    Atrophic gastritis with a few erosions--capsule endoscopy planned as of 02/25/12 (Dr. KDeatra Ina.  . 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 Hardware removal Right 08/12/2012    Procedure: HARDWARE REMOVAL;  Surgeon: CMcarthur Rossetti MD;  Location: [facility];  Service: Orthopedics;  Laterality: Right;    MEDS:  Not taking Norco/vicodin as listed below. Outpatient Prescriptions Prior to Visit  Medication Sig Dispense Refill  . chlorthalidone (HYGROTON) 25 MG tablet Take 1 tablet (25 mg total) by mouth daily.  30 tablet  1  . Cholecalciferol (VITAMIN D-3) 1000 UNITS CAPS Take 1 capsule by mouth daily.      . Chromium Picolinate 1000 MCG TABS Take 1 tablet by mouth daily.      . ferrous sulfate 325 (65 FE) MG tablet Take 325 mg by mouth daily with breakfast.      . glimepiride (AMARYL) 4 MG tablet Take 1 tablet (4 mg total) by mouth 2 (two) times daily.  180 tablet  1  . HYDROcodone-acetaminophen (NORCO/VICODIN) 5-325 MG per tablet 1-2 tabs po q6h prn pain  120 tablet  0  . Liraglutide (VICTOZA) 18 MG/3ML SOPN Inject 1.2 mg into the skin daily.  9 mL  6  . lovastatin (MEVACOR) 40 MG tablet Take 1 tablet (40 mg total) by mouth 2 (two) times daily.  180 tablet  1  . metFORMIN (GLUCOPHAGE) 1000 MG tablet Take 1 tablet (1,000  mg total) by mouth 2 (two) times daily with a meal.  180 tablet  1  . Multiple Vitamin (MULTIVITAMIN WITH MINERALS) TABS Take 1 tablet by mouth daily.      . pioglitazone (ACTOS) 45 MG tablet Take 1 tablet (45 mg total) by mouth daily.  30 tablet  6  . ramipril (ALTACE) 10 MG capsule Take 1 capsule (10 mg total) by mouth 2 (two) times daily.  180 capsule  1  . aspirin 81 MG tablet Take 81 mg by mouth daily.       No facility-administered medications prior to visit.    PE: Blood pressure 135/71, pulse 81, temperature 98 F (36.7 C), temperature source Temporal, resp. rate 18, height 5' 11" (1.803 m), weight 198 lb (89.812 kg), SpO2 98.00%. Gen: Alert, well appearing.  Patient is oriented to person, place, time, and situation. No further exam today.  IMPRESSION AND PLAN:  Type II or unspecified type diabetes mellitus without mention of complication, uncontrolled Glucose improving per home monitoring since starting victoza. Continue current meds, diet, and monitoring.  Abnormal chest x-ray Discussed today in depth--I told him I think the CXR findings are consistent with COPD.  He had recent PFTs via his employer that are c/w COPD.  He reports he has no symptoms of COPD. Decided to hold off on CT or pulm referral at this time. No meds since he is asymptomatic.  I reiterated that [place] would be his #1 treatment recommendation for this condition and he still said he is not contemplating quitting at this time.   Spent 25 min with pt today, with >50% of this time spent in counseling and care coordination regarding the above problems.  An After Visit Summary was printed and given to the patient.  FOLLOW UP: 3 mo

## 2012-11-09 ENCOUNTER — Encounter: Payer: Medicare Other | Admitting: Family Medicine

## 2012-11-09 MED ORDER — BUDESONIDE-FORMOTEROL FUMARATE 80-4.5 MCG/ACT IN AERO: 2.0000 | INHALATION_SPRAY | Freq: Two times a day (BID) | RESPIRATORY_TRACT | Status: DC

## 2012-11-14 NOTE — Assessment & Plan Note (Signed)
Glucose improving per home monitoring since starting victoza. Continue current meds, diet, and monitoring.

## 2012-11-14 NOTE — Assessment & Plan Note (Addendum)
Discussed today in depth--I told him I think the CXR findings are consistent with COPD.  He had recent PFTs via his employer that are c/w COPD.  He reports he has no symptoms of COPD. Decided to hold off on CT or pulm referral at this time. No meds since he is asymptomatic.  I reiterated that [place] would be his #1 treatment recommendation for this condition and he still said he is not contemplating quitting at this time.

## 2012-11-18 ENCOUNTER — Ambulatory Visit: Payer: Medicare Other | Admitting: Family Medicine

## 2012-12-06 ENCOUNTER — Other Ambulatory Visit: Payer: Self-pay | Admitting: Family Medicine

## 2012-12-06 MED ORDER — CHLORTHALIDONE 25 MG PO TABS: 25.0000 mg | ORAL_TABLET | Freq: Every day | ORAL | Status: DC

## 2012-12-06 NOTE — Telephone Encounter (Signed)
Patient requesting hygroton refill.  Please advise.

## 2013-02-08 ENCOUNTER — Ambulatory Visit (INDEPENDENT_AMBULATORY_CARE_PROVIDER_SITE_OTHER): Payer: Medicare HMO | Admitting: Family Medicine

## 2013-02-08 ENCOUNTER — Encounter: Payer: Self-pay | Admitting: Family Medicine

## 2013-02-08 VITALS — BP 120/69 | HR 78 | Temp 98.6°F | Resp 18 | Ht 71.0 in | Wt 201.0 lb

## 2013-02-08 DIAGNOSIS — E1165 Type 2 diabetes mellitus with hyperglycemia: Principal | ICD-10-CM

## 2013-02-08 DIAGNOSIS — J4489 Other specified chronic obstructive pulmonary disease: Secondary | ICD-10-CM

## 2013-02-08 DIAGNOSIS — I10 Essential (primary) hypertension: Secondary | ICD-10-CM

## 2013-02-08 DIAGNOSIS — IMO0001 Reserved for inherently not codable concepts without codable children: Secondary | ICD-10-CM

## 2013-02-08 DIAGNOSIS — J449 Chronic obstructive pulmonary disease, unspecified: Secondary | ICD-10-CM

## 2013-02-08 DIAGNOSIS — F17200 Nicotine dependence, unspecified, uncomplicated: Secondary | ICD-10-CM

## 2013-02-08 NOTE — Progress Notes (Signed)
OFFICE NOTE  02/08/2013  CC:  Chief Complaint  Patient presents with  . Follow-up     HPI: Patient is a 73 y.o. Caucasian male who is here for f/u DM 2. Glucose 150-200 range fasting.  Highest 232, lowest 148 (this morning). Admits to dietary indiscretion last 3 mo.  No exercise.    Feeling fine.  No home bps to report.  Still smoking about 1 pack/day.  Does not want to try to quit right now. He has been using symbicort pretty much every day and he notes no difference on it.  Says he never had sx's prior to being on it either.  He says he doesn't have need of a rescue inhaler.  Pertinent PMH:  Past medical, surgical, social, and family history reviewed and no changes are noted since last office visit.  MEDS:  Outpatient Prescriptions Prior to Visit  Medication Sig Dispense Refill  . aspirin 81 MG tablet Take 81 mg by mouth daily.      . chlorthalidone (HYGROTON) 25 MG tablet Take 1 tablet (25 mg total) by mouth daily.  30 tablet  10  . Cholecalciferol (VITAMIN D-3) 1000 UNITS CAPS Take 1 capsule by mouth daily.      . Chromium Picolinate 1000 MCG TABS Take 1 tablet by mouth daily.      . ferrous sulfate 325 (65 FE) MG tablet Take 325 mg by mouth daily with breakfast.      . glimepiride (AMARYL) 4 MG tablet Take 1 tablet (4 mg total) by mouth 2 (two) times daily.  180 tablet  1  . Liraglutide (VICTOZA) 18 MG/3ML SOPN Inject 1.2 mg into the skin daily.  9 mL  6  . lovastatin (MEVACOR) 40 MG tablet Take 1 tablet (40 mg total) by mouth 2 (two) times daily.  180 tablet  1  . metFORMIN (GLUCOPHAGE) 1000 MG tablet Take 1 tablet (1,000 mg total) by mouth 2 (two) times daily with a meal.  180 tablet  1  . Multiple Vitamin (MULTIVITAMIN WITH MINERALS) TABS Take 1 tablet by mouth daily.      . pioglitazone (ACTOS) 45 MG tablet Take 1 tablet (45 mg total) by mouth daily.  30 tablet  6  . ramipril (ALTACE) 10 MG capsule Take 1 capsule (10 mg total) by mouth 2 (two) times daily.  180 capsule   1   No facility-administered medications prior to visit.    PE: Blood pressure 120/69, pulse 78, temperature 98.6 F (37 C), temperature source Temporal, resp. rate 18, height _0  (1.803 m), weight 201 lb (91.173 kg), SpO2 95.00%. CV: RRR, no m/r/g.   LUNGS: CTA bilat, nonlabored resps, good aeration in all lung fields. EXT: trace lower leg edema bilat  IMPRESSION AND PLAN:  Type II or unspecified type diabetes mellitus without mention of complication, uncontrolled Recheck HbA1c. 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 Continue current maximal therapies and EAT A BETTER DIABETIC DIET AND EXERCISE MORE.  HTN (hypertension), benign The current medical regimen is effective;  continue present plan and medications. Lytes/cr today.  COPD (chronic obstructive pulmonary disease) Continue symbicort bid.  He has not required ANY rescue inhaler use.  Tobacco dependence He clearly told me today he is not contemplating quitting at this time or anytime soon.  An After Visit Summary was printed and given to the patient.  FOLLOW UP: 8mo(fasting, so we can repeat lipids)

## 2013-02-09 ENCOUNTER — Telehealth: Payer: Self-pay | Admitting: Family Medicine

## 2013-02-09 LAB — BASIC METABOLIC PANEL
BUN: 19 mg/dL (ref 6–23)
CHLORIDE: 100 meq/L (ref 96–112)
CO2: 24 mEq/L (ref 19–32)
CREATININE: 0.8 mg/dL (ref 0.4–1.5)
Calcium: 9.1 mg/dL (ref 8.4–10.5)
GFR: 96.31 mL/min (ref >60.00)
GLUCOSE: 291 mg/dL — AB (ref 70–99)
POTASSIUM: 4.8 meq/L (ref 3.5–5.1)
Sodium: 134 mEq/L — ABNORMAL LOW (ref 135–145)

## 2013-02-09 LAB — HEMOGLOBIN A1C: Hgb A1c MFr Bld: 10 % — ABNORMAL HIGH (ref 4.6–6.5)

## 2013-02-09 NOTE — Telephone Encounter (Signed)
Disp 2 samples please.-thx

## 2013-02-09 NOTE — Telephone Encounter (Signed)
Patient LMOM stating that it was symbicort samples that we gave him.

## 2013-02-09 NOTE — Telephone Encounter (Signed)
Relevant patient education assigned to patient using Emmi.

## 2013-02-09 NOTE — Telephone Encounter (Signed)
Samples at front desk for pick up.  Patient's wife aware.

## 2013-02-10 ENCOUNTER — Telehealth: Payer: Self-pay

## 2013-02-10 ENCOUNTER — Telehealth: Payer: Self-pay | Admitting: Family Medicine

## 2013-02-10 NOTE — Telephone Encounter (Signed)
Relevant patient education assigned to patient using Emmi.

## 2013-02-10 NOTE — Telephone Encounter (Signed)
Relevant patient education assigned to patient using Emmi. ° °

## 2013-02-12 NOTE — Assessment & Plan Note (Signed)
Continue symbicort bid.  He has not required ANY rescue inhaler use.

## 2013-02-12 NOTE — Assessment & Plan Note (Signed)
The current medical regimen is effective;  continue present plan and medications. Lytes/cr today.

## 2013-02-12 NOTE — Assessment & Plan Note (Signed)
He clearly told me today he is not contemplating quitting at this time or anytime soon.

## 2013-02-12 NOTE — Assessment & Plan Note (Signed)
Recheck HbA1c. 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 Continue current maximal therapies and EAT A BETTER DIABETIC DIET AND EXERCISE MORE.

## 2013-03-31 ENCOUNTER — Telehealth: Payer: Self-pay | Admitting: Family Medicine

## 2013-03-31 DIAGNOSIS — Z23 Encounter for immunization: Secondary | ICD-10-CM

## 2013-03-31 DIAGNOSIS — IMO0001 Reserved for inherently not codable concepts without codable children: Secondary | ICD-10-CM

## 2013-03-31 DIAGNOSIS — E1165 Type 2 diabetes mellitus with hyperglycemia: Secondary | ICD-10-CM

## 2013-03-31 MED ORDER — GLIMEPIRIDE 4 MG PO TABS: 4.0000 mg | ORAL_TABLET | Freq: Two times a day (BID) | ORAL | Status: DC

## 2013-03-31 NOTE — Telephone Encounter (Signed)
Patient is out of his glimepiride and needs a refill sent to [facility] so he can pick it up tomorrow morning

## 2013-03-31 NOTE — Telephone Encounter (Signed)
Rx sent to pharmacy.

## 2013-04-17 ENCOUNTER — Other Ambulatory Visit: Payer: Self-pay | Admitting: Family Medicine

## 2013-04-17 MED ORDER — METFORMIN HCL 1000 MG PO TABS: 1000.0000 mg | ORAL_TABLET | Freq: Two times a day (BID) | ORAL | Status: DC

## 2013-04-17 MED ORDER — LOVASTATIN 40 MG PO TABS
40.0000 mg | ORAL_TABLET | Freq: Two times a day (BID) | ORAL | Status: DC
Start: 2013-04-17 — End: 2013-10-16

## 2013-04-24 ENCOUNTER — Other Ambulatory Visit: Payer: Self-pay | Admitting: Family Medicine

## 2013-04-24 MED ORDER — RAMIPRIL 10 MG PO CAPS: 10.0000 mg | ORAL_CAPSULE | Freq: Two times a day (BID) | ORAL | Status: DC

## 2013-04-24 NOTE — Telephone Encounter (Signed)
Patient's wife called requesting refill for ramipril.  Pt's wife states they have been requesting rf since last week but we never got anything from [place].   Rx sent into pharmacy per pt request and [place] protocol.

## 2013-05-02 ENCOUNTER — Other Ambulatory Visit: Payer: Self-pay

## 2013-05-02 MED ORDER — PIOGLITAZONE HCL 45 MG PO TABS: 45.0000 mg | ORAL_TABLET | Freq: Every day | ORAL | Status: DC

## 2013-06-08 ENCOUNTER — Encounter: Payer: Self-pay | Admitting: Family Medicine

## 2013-06-08 ENCOUNTER — Ambulatory Visit (INDEPENDENT_AMBULATORY_CARE_PROVIDER_SITE_OTHER): Payer: Medicare HMO | Admitting: Family Medicine

## 2013-06-08 VITALS — BP 135/72 | HR 82 | Temp 98.3°F | Resp 18 | Ht 71.0 in | Wt 197.0 lb

## 2013-06-08 DIAGNOSIS — F17200 Nicotine dependence, unspecified, uncomplicated: Secondary | ICD-10-CM

## 2013-06-08 DIAGNOSIS — Z716 Tobacco abuse counseling: Secondary | ICD-10-CM

## 2013-06-08 DIAGNOSIS — I10 Essential (primary) hypertension: Secondary | ICD-10-CM

## 2013-06-08 DIAGNOSIS — Z7189 Other specified counseling: Secondary | ICD-10-CM

## 2013-06-08 DIAGNOSIS — J449 Chronic obstructive pulmonary disease, unspecified: Secondary | ICD-10-CM

## 2013-06-08 DIAGNOSIS — J4489 Other specified chronic obstructive pulmonary disease: Secondary | ICD-10-CM

## 2013-06-08 DIAGNOSIS — E1165 Type 2 diabetes mellitus with hyperglycemia: Principal | ICD-10-CM

## 2013-06-08 DIAGNOSIS — IMO0001 Reserved for inherently not codable concepts without codable children: Secondary | ICD-10-CM

## 2013-06-08 DIAGNOSIS — E785 Hyperlipidemia, unspecified: Secondary | ICD-10-CM

## 2013-06-08 MED ORDER — BUPROPION HCL ER (XL) 150 MG PO TB24: 150.0000 mg | ORAL_TABLET | Freq: Every day | ORAL | Status: DC

## 2013-06-08 MED ORDER — BUDESONIDE-FORMOTEROL FUMARATE 160-4.5 MCG/ACT IN AERO: 2.0000 | INHALATION_SPRAY | Freq: Two times a day (BID) | RESPIRATORY_TRACT | Status: DC

## 2013-06-08 NOTE — Progress Notes (Signed)
OFFICE NOTE  06/08/2013  CC:  Chief Complaint  Patient presents with  . Follow-up   HPI: Patient is a 73 y.o. Caucasian male who is here for 4 mo f/u DM 2, HTN, hyperlipidemia. Reports noncompliance with diet, exercise, and glucose monitoring.   Occ fasting glucose: 170-270.  He is taking his victoza at bedtime instead of morning. Last eye exam > 1 yr ago.  He is working on establishing with new eye MD in [place].  Feet with no significant tingling, numbness, or pain. Reports having a paronychia on left foot great toe a few weeks ago, got it to open and drain completely with epsom salt soaks, applied antibiotic ointment.   He is not fasting today.  Still smoking.  Contemplating quitting.  Says he gets a lot of enjoyment out of smoking and it "keeps my nerves down". Wellbutrin helped in the past and he is open to trying it again.  Pertinent PMH:  Past medical, surgical, social, and family history reviewed and no changes are noted since last office visit.  MEDS:  Symbicort 160/4.5, 2 puffs bid Outpatient Prescriptions Prior to Visit  Medication Sig Dispense Refill  . aspirin 81 MG tablet Take 81 mg by mouth daily.      . chlorthalidone (HYGROTON) 25 MG tablet Take 1 tablet (25 mg total) by mouth daily.  30 tablet  10  . Cholecalciferol (VITAMIN D-3) 1000 UNITS CAPS Take 1 capsule by mouth daily.      . Chromium Picolinate 1000 MCG TABS Take 1 tablet by mouth daily.      . ferrous sulfate 325 (65 FE) MG tablet Take 325 mg by mouth daily with breakfast.      . glimepiride (AMARYL) 4 MG tablet Take 1 tablet (4 mg total) by mouth 2 (two) times daily.  180 tablet  1  . Liraglutide (VICTOZA) 18 MG/3ML SOPN Inject 1.2 mg into the skin daily.  9 mL  6  . lovastatin (MEVACOR) 40 MG tablet Take 1 tablet (40 mg total) by mouth 2 (two) times daily.  180 tablet  1  . metFORMIN (GLUCOPHAGE) 1000 MG tablet Take 1 tablet (1,000 mg total) by mouth 2 (two) times daily with a meal.  180 tablet  1   . Multiple Vitamin (MULTIVITAMIN WITH MINERALS) TABS Take 1 tablet by mouth daily.      . pioglitazone (ACTOS) 45 MG tablet Take 1 tablet (45 mg total) by mouth daily.  30 tablet  1  . ramipril (ALTACE) 10 MG capsule Take 1 capsule (10 mg total) by mouth 2 (two) times daily.  180 capsule  1   No facility-administered medications prior to visit.    PE: Blood pressure 135/72, pulse 82, temperature 98.3 F (36.8 C), temperature source Temporal, resp. rate 18, height _0  (1.803 m), weight 197 lb (89.359 kg), SpO2 93.00%. Gen: Alert, well appearing.  Patient is oriented to person, place, time, and situation. CV: RRR, no m/r/g.   LUNGS: CTA bilat, nonlabored resps, good aeration in all lung fields. EXT: no clubbing, cyanosis, or edema.  Toes without erythema or swelling or tenderness or excessive warmth.  There is a bit of superficial dry skin peeling noted proximal to great toe nail on left foot.  IMPRESSION AND PLAN:  Type II or unspecified type diabetes mellitus without mention of complication, uncontrolled Noncompliant with diet. He knows this is a problem of motivation, not education. Needs retinopathy screening--he is arranging new eye md visit. Needs prevnar 13 after  10/03/13. If A1c not improved or if worsened--pt still refuses insulin. The only changes I could make are increasing his victoza to 1.8 mg qd (and I recommended he take this in morning, not at bedtime) and add SGTP2 inhibitor like farxiga.  Pt said he is open to anything except insulin.   COPD (chronic obstructive pulmonary disease) Continue symbicort 160/4.5, 2 puffs bid. Rx today, as we are out of all samples. Emphasized importance of smoking cessation--he's going to try again.  HTN (hypertension), benign The current medical regimen is effective;  continue present plan and medications. BMET today.  Hyperlipidemia Needs FLP but I am having trouble getting him in for FASTING labs. Continue mevacor 8m qd.   Check AST/ALT. Next appt fasting so we can get FLP.  Tobacco dependence Pt actually quit with wellbutrin in the past but only short term. He is open to trying this again, so I rx'd wellbutrin XL [id number].   An After Visit Summary was printed and given to the patient.  FOLLOW UP: 8m28mo

## 2013-06-08 NOTE — Assessment & Plan Note (Signed)
Pt actually quit with wellbutrin in the past but only short term. He is open to trying this again, so I rx'd wellbutrin XL 12m qd.

## 2013-06-08 NOTE — Assessment & Plan Note (Signed)
The current medical regimen is effective;  continue present plan and medications. BMET today.

## 2013-06-08 NOTE — Assessment & Plan Note (Addendum)
Noncompliant with diet. He knows this is a problem of motivation, not education. Needs retinopathy screening--he is arranging new eye md visit. Needs prevnar 13 after 10/03/13. If A1c not improved or if worsened--pt still refuses insulin. The only changes I could make are increasing his victoza to 1.8 mg qd (and I recommended he take this in morning, not at bedtime) and add SGTP2 inhibitor like farxiga.  Pt said he is open to anything except insulin.

## 2013-06-08 NOTE — Progress Notes (Signed)
Pre visit review using our clinic review tool, if applicable. No additional management support is needed unless otherwise documented below in the visit note.

## 2013-06-08 NOTE — Assessment & Plan Note (Addendum)
Continue symbicort 160/4.5, 2 puffs bid. Rx today, as we are out of all samples. Emphasized importance of smoking cessation--he's going to try again.

## 2013-06-08 NOTE — Assessment & Plan Note (Signed)
Needs FLP but I am having trouble getting him in for FASTING labs. Continue mevacor 62m qd.  Check AST/ALT. Next appt fasting so we can get FLP.

## 2013-06-09 ENCOUNTER — Telehealth: Payer: Self-pay | Admitting: Family Medicine

## 2013-06-09 DIAGNOSIS — IMO0001 Reserved for inherently not codable concepts without codable children: Secondary | ICD-10-CM

## 2013-06-09 DIAGNOSIS — Z135 Encounter for screening for eye and ear disorders: Secondary | ICD-10-CM

## 2013-06-09 DIAGNOSIS — E1165 Type 2 diabetes mellitus with hyperglycemia: Secondary | ICD-10-CM

## 2013-06-09 LAB — COMPREHENSIVE METABOLIC PANEL
ALT: 15 U/L (ref 0–53)
AST: 18 U/L (ref 0–37)
Albumin: 3.6 g/dL (ref 3.5–5.2)
Alkaline Phosphatase: 81 U/L (ref 39–117)
BILIRUBIN TOTAL: 0.5 mg/dL (ref 0.2–1.2)
BUN: 22 mg/dL (ref 6–23)
CALCIUM: 9.4 mg/dL (ref 8.4–10.5)
CHLORIDE: 102 meq/L (ref 96–112)
CO2: 26 meq/L (ref 19–32)
CREATININE: 0.9 mg/dL (ref 0.4–1.5)
GFR: 92.36 mL/min (ref >60.00)
Glucose, Bld: 229 mg/dL — ABNORMAL HIGH (ref 70–99)
Potassium: 4.3 mEq/L (ref 3.5–5.1)
Sodium: 138 mEq/L (ref 135–145)
Total Protein: 6.2 g/dL (ref 6.0–8.3)

## 2013-06-09 LAB — HEMOGLOBIN A1C: Hgb A1c MFr Bld: 10.7 % — ABNORMAL HIGH (ref 4.6–6.5)

## 2013-06-09 NOTE — Telephone Encounter (Signed)
Patient has an appointment on 6/25 with [facility] and needs a referral to be sent to them. Their phone number is [phone]

## 2013-06-09 NOTE — Telephone Encounter (Signed)
Please advise?

## 2013-06-11 ENCOUNTER — Other Ambulatory Visit: Payer: Self-pay | Admitting: Family Medicine

## 2013-06-11 DIAGNOSIS — Z23 Encounter for immunization: Secondary | ICD-10-CM

## 2013-06-11 DIAGNOSIS — E1165 Type 2 diabetes mellitus with hyperglycemia: Secondary | ICD-10-CM

## 2013-06-11 DIAGNOSIS — IMO0001 Reserved for inherently not codable concepts without codable children: Secondary | ICD-10-CM

## 2013-06-11 MED ORDER — LIRAGLUTIDE 18 MG/3ML ~~LOC~~ SOPN: 1.8000 mg | PEN_INJECTOR | Freq: Every day | SUBCUTANEOUS | Status: DC

## 2013-06-11 MED ORDER — DAPAGLIFLOZIN PROPANEDIOL 5 MG PO TABS
5.0000 mg | ORAL_TABLET | Freq: Every day | ORAL | Status: DC
Start: 2013-06-11 — End: 2013-07-06

## 2013-06-11 NOTE — Telephone Encounter (Signed)
Referral ordered as per pt request.

## 2013-06-26 ENCOUNTER — Other Ambulatory Visit: Payer: Self-pay | Admitting: Family Medicine

## 2013-06-26 MED ORDER — GLIMEPIRIDE 4 MG PO TABS: 4.0000 mg | ORAL_TABLET | Freq: Two times a day (BID) | ORAL | Status: DC

## 2013-06-26 MED ORDER — PIOGLITAZONE HCL 45 MG PO TABS: 45.0000 mg | ORAL_TABLET | Freq: Every day | ORAL | Status: DC

## 2013-06-29 LAB — HM DIABETES EYE EXAM

## 2013-06-30 ENCOUNTER — Other Ambulatory Visit: Payer: Self-pay

## 2013-06-30 ENCOUNTER — Ambulatory Visit (HOSPITAL_BASED_OUTPATIENT_CLINIC_OR_DEPARTMENT_OTHER)
Admission: RE | Admit: 2013-06-30 | Discharge: 2013-06-30 | Disposition: A | Discharge status: Home / Self Care | Payer: Medicare HMO | Source: Ambulatory Visit | Attending: Family Medicine | Admitting: Family Medicine

## 2013-06-30 ENCOUNTER — Ambulatory Visit (INDEPENDENT_AMBULATORY_CARE_PROVIDER_SITE_OTHER): Payer: Medicare HMO | Admitting: Family Medicine

## 2013-06-30 ENCOUNTER — Encounter (HOSPITAL_BASED_OUTPATIENT_CLINIC_OR_DEPARTMENT_OTHER): Payer: Self-pay | Admitting: Emergency Medicine

## 2013-06-30 ENCOUNTER — Encounter: Payer: Self-pay | Admitting: Family Medicine

## 2013-06-30 ENCOUNTER — Inpatient Hospital Stay (HOSPITAL_BASED_OUTPATIENT_CLINIC_OR_DEPARTMENT_OTHER)
Admission: EM | Admit: 2013-06-30 | Discharge: 2013-07-04 | DRG: 314 | Disposition: A | Discharge status: Home / Self Care | Payer: Medicare HMO | Attending: Internal Medicine | Admitting: Internal Medicine

## 2013-06-30 VITALS — BP 97/62 | HR 95 | Temp 98.0°F | Ht 71.0 in | Wt 201.0 lb

## 2013-06-30 DIAGNOSIS — R9431 Abnormal electrocardiogram [ECG] [EKG]: Secondary | ICD-10-CM

## 2013-06-30 DIAGNOSIS — I472 Ventricular tachycardia, unspecified: Secondary | ICD-10-CM | POA: Diagnosis present

## 2013-06-30 DIAGNOSIS — N181 Chronic kidney disease, stage 1: Secondary | ICD-10-CM

## 2013-06-30 DIAGNOSIS — E785 Hyperlipidemia, unspecified: Secondary | ICD-10-CM

## 2013-06-30 DIAGNOSIS — N183 Chronic kidney disease, stage 3 unspecified: Secondary | ICD-10-CM | POA: Diagnosis present

## 2013-06-30 DIAGNOSIS — R079 Chest pain, unspecified: Secondary | ICD-10-CM

## 2013-06-30 DIAGNOSIS — M1710 Unilateral primary osteoarthritis, unspecified knee: Secondary | ICD-10-CM | POA: Diagnosis present

## 2013-06-30 DIAGNOSIS — I517 Cardiomegaly: Secondary | ICD-10-CM

## 2013-06-30 DIAGNOSIS — E1129 Type 2 diabetes mellitus with other diabetic kidney complication: Secondary | ICD-10-CM | POA: Diagnosis present

## 2013-06-30 DIAGNOSIS — I309 Acute pericarditis, unspecified: Principal | ICD-10-CM | POA: Diagnosis present

## 2013-06-30 DIAGNOSIS — R9389 Abnormal findings on diagnostic imaging of other specified body structures: Secondary | ICD-10-CM

## 2013-06-30 DIAGNOSIS — R5383 Other fatigue: Secondary | ICD-10-CM

## 2013-06-30 DIAGNOSIS — M546 Pain in thoracic spine: Secondary | ICD-10-CM

## 2013-06-30 DIAGNOSIS — I249 Acute ischemic heart disease, unspecified: Secondary | ICD-10-CM

## 2013-06-30 DIAGNOSIS — R059 Cough, unspecified: Secondary | ICD-10-CM

## 2013-06-30 DIAGNOSIS — R531 Weakness: Secondary | ICD-10-CM

## 2013-06-30 DIAGNOSIS — Z7982 Long term (current) use of aspirin: Secondary | ICD-10-CM

## 2013-06-30 DIAGNOSIS — J440 Chronic obstructive pulmonary disease with acute lower respiratory infection: Secondary | ICD-10-CM | POA: Diagnosis present

## 2013-06-30 DIAGNOSIS — J9620 Acute and chronic respiratory failure, unspecified whether with hypoxia or hypercapnia: Secondary | ICD-10-CM | POA: Diagnosis present

## 2013-06-30 DIAGNOSIS — I314 Cardiac tamponade: Secondary | ICD-10-CM

## 2013-06-30 DIAGNOSIS — K922 Gastrointestinal hemorrhage, unspecified: Secondary | ICD-10-CM

## 2013-06-30 DIAGNOSIS — J209 Acute bronchitis, unspecified: Secondary | ICD-10-CM

## 2013-06-30 DIAGNOSIS — Z96649 Presence of unspecified artificial hip joint: Secondary | ICD-10-CM

## 2013-06-30 DIAGNOSIS — E1165 Type 2 diabetes mellitus with hyperglycemia: Secondary | ICD-10-CM | POA: Diagnosis present

## 2013-06-30 DIAGNOSIS — H3530 Unspecified macular degeneration: Secondary | ICD-10-CM | POA: Diagnosis present

## 2013-06-30 DIAGNOSIS — IMO0002 Reserved for concepts with insufficient information to code with codable children: Secondary | ICD-10-CM

## 2013-06-30 DIAGNOSIS — J441 Chronic obstructive pulmonary disease with (acute) exacerbation: Secondary | ICD-10-CM

## 2013-06-30 DIAGNOSIS — R0602 Shortness of breath: Secondary | ICD-10-CM

## 2013-06-30 DIAGNOSIS — Z803 Family history of malignant neoplasm of breast: Secondary | ICD-10-CM

## 2013-06-30 DIAGNOSIS — N058 Unspecified nephritic syndrome with other morphologic changes: Secondary | ICD-10-CM | POA: Diagnosis present

## 2013-06-30 DIAGNOSIS — IMO0001 Reserved for inherently not codable concepts without codable children: Secondary | ICD-10-CM

## 2013-06-30 DIAGNOSIS — I200 Unstable angina: Secondary | ICD-10-CM

## 2013-06-30 DIAGNOSIS — F17200 Nicotine dependence, unspecified, uncomplicated: Secondary | ICD-10-CM

## 2013-06-30 DIAGNOSIS — Z885 Allergy status to narcotic agent status: Secondary | ICD-10-CM

## 2013-06-30 DIAGNOSIS — Z823 Family history of stroke: Secondary | ICD-10-CM

## 2013-06-30 DIAGNOSIS — Z8249 Family history of ischemic heart disease and other diseases of the circulatory system: Secondary | ICD-10-CM

## 2013-06-30 DIAGNOSIS — E1159 Type 2 diabetes mellitus with other circulatory complications: Secondary | ICD-10-CM

## 2013-06-30 DIAGNOSIS — I313 Pericardial effusion (noninflammatory): Secondary | ICD-10-CM

## 2013-06-30 DIAGNOSIS — I480 Paroxysmal atrial fibrillation: Secondary | ICD-10-CM

## 2013-06-30 DIAGNOSIS — Z833 Family history of diabetes mellitus: Secondary | ICD-10-CM

## 2013-06-30 DIAGNOSIS — I3139 Other pericardial effusion (noninflammatory): Secondary | ICD-10-CM

## 2013-06-30 DIAGNOSIS — I10 Essential (primary) hypertension: Secondary | ICD-10-CM

## 2013-06-30 DIAGNOSIS — R05 Cough: Secondary | ICD-10-CM

## 2013-06-30 DIAGNOSIS — I4729 Other ventricular tachycardia: Secondary | ICD-10-CM

## 2013-06-30 DIAGNOSIS — I509 Heart failure, unspecified: Secondary | ICD-10-CM | POA: Diagnosis present

## 2013-06-30 DIAGNOSIS — J90 Pleural effusion, not elsewhere classified: Secondary | ICD-10-CM

## 2013-06-30 DIAGNOSIS — R5381 Other malaise: Secondary | ICD-10-CM

## 2013-06-30 DIAGNOSIS — I129 Hypertensive chronic kidney disease with stage 1 through stage 4 chronic kidney disease, or unspecified chronic kidney disease: Secondary | ICD-10-CM | POA: Diagnosis present

## 2013-06-30 DIAGNOSIS — Z888 Allergy status to other drugs, medicaments and biological substances status: Secondary | ICD-10-CM

## 2013-06-30 DIAGNOSIS — E669 Obesity, unspecified: Secondary | ICD-10-CM

## 2013-06-30 DIAGNOSIS — I4891 Unspecified atrial fibrillation: Secondary | ICD-10-CM

## 2013-06-30 HISTORY — DX: Cardiac tamponade: I31.4

## 2013-06-30 HISTORY — DX: Pericardial effusion (noninflammatory): I31.3

## 2013-06-30 LAB — PRO B NATRIURETIC PEPTIDE: Pro B Natriuretic peptide (BNP): 1443 pg/mL — ABNORMAL HIGH (ref 0–125)

## 2013-06-30 LAB — COMPREHENSIVE METABOLIC PANEL
ALBUMIN: 2.8 g/dL — AB (ref 3.5–5.2)
ALK PHOS: 140 U/L — AB (ref 39–117)
ALT: 72 U/L — ABNORMAL HIGH (ref 0–53)
ALT: 66 U/L — AB (ref 0–53)
AST: 35 U/L (ref 0–37)
AST: 27 U/L (ref 0–37)
Albumin: 2.8 g/dL — ABNORMAL LOW (ref 3.5–5.2)
Alkaline Phosphatase: 155 U/L — ABNORMAL HIGH (ref 39–117)
BILIRUBIN TOTAL: 0.5 mg/dL (ref 0.2–1.2)
BUN: 59 mg/dL — ABNORMAL HIGH (ref 6–23)
BUN: 61 mg/dL — AB (ref 6–23)
CALCIUM: 9.5 mg/dL (ref 8.4–10.5)
CO2: 20 mEq/L (ref 19–32)
CO2: 20 meq/L (ref 19–32)
Calcium: 8.9 mg/dL (ref 8.4–10.5)
Chloride: 100 mEq/L (ref 96–112)
Chloride: 96 mEq/L (ref 96–112)
Creatinine, Ser: 1.3 mg/dL (ref 0.4–1.5)
Creatinine, Ser: 1.1 mg/dL (ref 0.50–1.35)
GFR, EST AFRICAN AMERICAN: 74 mL/min — AB (ref >90)
GFR, EST NON AFRICAN AMERICAN: 64 mL/min — AB (ref >90)
GFR: 58.89 mL/min — ABNORMAL LOW (ref >60.00)
GLUCOSE: 440 mg/dL — AB (ref 70–99)
GLUCOSE: 395 mg/dL — AB (ref 70–99)
POTASSIUM: 4.8 meq/L (ref 3.5–5.1)
Potassium: 4.2 mEq/L (ref 3.7–5.3)
SODIUM: 132 meq/L — AB (ref 135–145)
SODIUM: 134 meq/L — AB (ref 137–147)
TOTAL PROTEIN: 6.3 g/dL (ref 6.0–8.3)
Total Bilirubin: 0.3 mg/dL (ref 0.3–1.2)
Total Protein: 7 g/dL (ref 6.0–8.3)

## 2013-06-30 LAB — CBC WITH DIFFERENTIAL/PLATELET
BASOS ABS: 0 10*3/uL (ref 0.0–0.1)
BASOS PCT: 0.3 % (ref 0.0–3.0)
Basophils Absolute: 0 10*3/uL (ref 0.0–0.1)
Basophils Relative: 0 % (ref 0–1)
Eosinophils Absolute: 0 10*3/uL (ref 0.0–0.7)
Eosinophils Absolute: 0.1 10*3/uL (ref 0.0–0.7)
Eosinophils Relative: 0.3 % (ref 0.0–5.0)
Eosinophils Relative: 1 % (ref 0–5)
HCT: 37.3 % — ABNORMAL LOW (ref 39.0–52.0)
HCT: 37.7 % — ABNORMAL LOW (ref 39.0–52.0)
Hemoglobin: 12.3 g/dL — ABNORMAL LOW (ref 13.0–17.0)
Hemoglobin: 12.3 g/dL — ABNORMAL LOW (ref 13.0–17.0)
LYMPHS PCT: 10.3 % — AB (ref 12.0–46.0)
Lymphocytes Relative: 13 % (ref 12–46)
Lymphs Abs: 1 10*3/uL (ref 0.7–4.0)
Lymphs Abs: 1.2 10*3/uL (ref 0.7–4.0)
MCH: 28 pg (ref 26.0–34.0)
MCHC: 33.1 g/dL (ref 30.0–36.0)
MCHC: 32.6 g/dL (ref 30.0–36.0)
MCV: 85 fl (ref 78.0–100.0)
MCV: 85.9 fL (ref 78.0–100.0)
MONO ABS: 1.2 10*3/uL — AB (ref 0.1–1.0)
Monocytes Absolute: 1 10*3/uL (ref 0.1–1.0)
Monocytes Relative: 10.5 % (ref 3.0–12.0)
Monocytes Relative: 13 % — ABNORMAL HIGH (ref 3–12)
NEUTROS ABS: 7.1 10*3/uL (ref 1.7–7.7)
Neutro Abs: 7.6 10*3/uL (ref 1.4–7.7)
Neutrophils Relative %: 78.6 % — ABNORMAL HIGH (ref 43.0–77.0)
Neutrophils Relative %: 74 % (ref 43–77)
PLATELETS: 254 10*3/uL (ref 150.0–400.0)
Platelets: 242 10*3/uL (ref 150–400)
RBC: 4.39 Mil/uL (ref 4.22–5.81)
RBC: 4.39 MIL/uL (ref 4.22–5.81)
RDW: 15.1 % (ref 11.5–15.5)
RDW: 14.5 % (ref 11.5–15.5)
WBC: 9.7 10*3/uL (ref 4.0–10.5)
WBC: 9.7 10*3/uL (ref 4.0–10.5)

## 2013-06-30 LAB — TROPONIN I: Troponin I: <0.3 ng/mL (ref <0.30)

## 2013-06-30 LAB — IBC PANEL
Iron: 21 ug/dL — ABNORMAL LOW (ref 42–165)
SATURATION RATIOS: 7.5 % — AB (ref 20.0–50.0)
Transferrin: 200.5 mg/dL — ABNORMAL LOW (ref 212.0–360.0)

## 2013-06-30 LAB — TSH: TSH: 0.67 u[IU]/mL (ref 0.35–4.50)

## 2013-06-30 LAB — PROTIME-INR
INR: 1.07 (ref 0.00–1.49)
Prothrombin Time: 13.9 seconds (ref 11.6–15.2)

## 2013-06-30 LAB — APTT: aPTT: 36 seconds (ref 24–37)

## 2013-06-30 LAB — FERRITIN: Ferritin: 200.6 ng/mL (ref 22.0–322.0)

## 2013-06-30 LAB — GLUCOSE, CAPILLARY: Glucose-Capillary: 323 mg/dL — ABNORMAL HIGH (ref 70–99)

## 2013-06-30 MED ORDER — SODIUM CHLORIDE 0.9 % IJ SOLN
3.0000 mL | Freq: Two times a day (BID) | INTRAMUSCULAR | Status: DC
  Administered 2013-07-01 (×2): 3 mL via INTRAVENOUS

## 2013-06-30 MED ORDER — BUDESONIDE-FORMOTEROL FUMARATE 160-4.5 MCG/ACT IN AERO
2.0000 | INHALATION_SPRAY | Freq: Two times a day (BID) | RESPIRATORY_TRACT | Status: DC
  Administered 2013-07-01 – 2013-07-04 (×7): 2 via RESPIRATORY_TRACT
  Filled 2013-06-30 (×3): qty 6

## 2013-06-30 MED ORDER — LEVOFLOXACIN IN D5W 750 MG/150ML IV SOLN
750.0000 mg | Freq: Every day | INTRAVENOUS | Status: DC
  Administered 2013-07-01: 750 mg via INTRAVENOUS
  Filled 2013-06-30 (×2): qty 150

## 2013-06-30 MED ORDER — HEPARIN (PORCINE) IN NACL 100-0.45 UNIT/ML-% IJ SOLN
12.0000 [IU]/kg/h | INTRAMUSCULAR | Status: DC
  Administered 2013-06-30: 12 [IU]/kg/h via INTRAVENOUS
  Filled 2013-06-30: qty 250

## 2013-06-30 MED ORDER — CHLORTHALIDONE 25 MG PO TABS
25.0000 mg | ORAL_TABLET | Freq: Every day | ORAL | Status: DC
  Administered 2013-07-01 – 2013-07-04 (×4): 25 mg via ORAL
  Filled 2013-06-30 (×4): qty 1

## 2013-06-30 MED ORDER — SODIUM CHLORIDE 0.9 % IJ SOLN: 3.0000 mL | INTRAMUSCULAR | Status: DC | PRN

## 2013-06-30 MED ORDER — METHYLPREDNISOLONE SODIUM SUCC 40 MG IJ SOLR
40.0000 mg | Freq: Four times a day (QID) | INTRAMUSCULAR | Status: DC
  Administered 2013-07-01 (×2): 40 mg via INTRAVENOUS
  Filled 2013-06-30 (×6): qty 1

## 2013-06-30 MED ORDER — ASPIRIN 81 MG PO CHEW
CHEWABLE_TABLET | ORAL | Status: AC
  Filled 2013-06-30: qty 4

## 2013-06-30 MED ORDER — GLIMEPIRIDE 4 MG PO TABS
4.0000 mg | ORAL_TABLET | Freq: Two times a day (BID) | ORAL | Status: DC
  Administered 2013-07-01 – 2013-07-04 (×6): 4 mg via ORAL
  Filled 2013-06-30 (×9): qty 1

## 2013-06-30 MED ORDER — SODIUM CHLORIDE 0.9 % IJ SOLN
3.0000 mL | Freq: Two times a day (BID) | INTRAMUSCULAR | Status: DC
  Administered 2013-07-01 – 2013-07-03 (×5): 3 mL via INTRAVENOUS
  Administered 2013-07-04: 08:00:00 via INTRAVENOUS

## 2013-06-30 MED ORDER — SODIUM CHLORIDE 0.9 % IV BOLUS (SEPSIS)
500.0000 mL | Freq: Once | INTRAVENOUS | Status: AC
  Administered 2013-06-30: 500 mL via INTRAVENOUS

## 2013-06-30 MED ORDER — SODIUM CHLORIDE 0.9 % IV SOLN: 250.0000 mL | INTRAVENOUS | Status: DC | PRN

## 2013-06-30 MED ORDER — ADULT MULTIVITAMIN W/MINERALS CH
1.0000 | ORAL_TABLET | Freq: Every day | ORAL | Status: DC
  Administered 2013-07-01 – 2013-07-04 (×4): 1 via ORAL
  Filled 2013-06-30 (×4): qty 1

## 2013-06-30 MED ORDER — INSULIN ASPART 100 UNIT/ML ~~LOC~~ SOLN
0.0000 [IU] | SUBCUTANEOUS | Status: DC
  Administered 2013-06-30: 15 [IU] via SUBCUTANEOUS
  Administered 2013-07-01: 4 [IU] via SUBCUTANEOUS
  Administered 2013-07-01: 7 [IU] via SUBCUTANEOUS

## 2013-06-30 MED ORDER — HEPARIN (PORCINE) IN NACL 100-0.45 UNIT/ML-% IJ SOLN
12.0000 [IU]/kg/h | INTRAMUSCULAR | Status: DC
  Administered 2013-06-30: 1100 [IU]/h via INTRAVENOUS
  Filled 2013-06-30: qty 250

## 2013-06-30 MED ORDER — ONDANSETRON HCL 4 MG/2ML IJ SOLN: 4.0000 mg | Freq: Four times a day (QID) | INTRAMUSCULAR | Status: DC | PRN

## 2013-06-30 MED ORDER — ASPIRIN EC 81 MG PO TBEC
81.0000 mg | DELAYED_RELEASE_TABLET | Freq: Every day | ORAL | Status: DC
  Administered 2013-07-01 – 2013-07-04 (×4): 81 mg via ORAL
  Filled 2013-06-30 (×4): qty 1

## 2013-06-30 MED ORDER — RAMIPRIL 10 MG PO CAPS
10.0000 mg | ORAL_CAPSULE | Freq: Two times a day (BID) | ORAL | Status: DC
  Administered 2013-07-01 – 2013-07-04 (×7): 10 mg via ORAL
  Filled 2013-06-30 (×10): qty 1

## 2013-06-30 MED ORDER — FUROSEMIDE 10 MG/ML IJ SOLN
40.0000 mg | Freq: Once | INTRAMUSCULAR | Status: AC
  Administered 2013-06-30: 40 mg via INTRAVENOUS
  Filled 2013-06-30: qty 4

## 2013-06-30 MED ORDER — ASPIRIN 81 MG PO CHEW
324.0000 mg | CHEWABLE_TABLET | Freq: Once | ORAL | Status: AC
  Administered 2013-06-30: 324 mg via ORAL

## 2013-06-30 MED ORDER — PIOGLITAZONE HCL 45 MG PO TABS
45.0000 mg | ORAL_TABLET | Freq: Every day | ORAL | Status: DC
  Administered 2013-07-01: 45 mg via ORAL
  Filled 2013-06-30 (×2): qty 1

## 2013-06-30 MED ORDER — ONDANSETRON HCL 4 MG PO TABS: 4.0000 mg | ORAL_TABLET | Freq: Four times a day (QID) | ORAL | Status: DC | PRN

## 2013-06-30 MED ORDER — SIMVASTATIN 20 MG PO TABS
20.0000 mg | ORAL_TABLET | Freq: Every day | ORAL | Status: DC
  Filled 2013-06-30: qty 1

## 2013-06-30 MED ORDER — ALBUTEROL SULFATE (2.5 MG/3ML) 0.083% IN NEBU
2.5000 mg | INHALATION_SOLUTION | Freq: Four times a day (QID) | RESPIRATORY_TRACT | Status: DC
  Filled 2013-06-30: qty 3

## 2013-06-30 MED ORDER — METFORMIN HCL 500 MG PO TABS
1000.0000 mg | ORAL_TABLET | Freq: Two times a day (BID) | ORAL | Status: DC
  Administered 2013-07-01 – 2013-07-04 (×6): 1000 mg via ORAL
  Filled 2013-06-30 (×9): qty 2

## 2013-06-30 MED ORDER — BUPROPION HCL ER (XL) 150 MG PO TB24
150.0000 mg | ORAL_TABLET | Freq: Every day | ORAL | Status: DC
  Administered 2013-07-01 – 2013-07-04 (×4): 150 mg via ORAL
  Filled 2013-06-30 (×4): qty 1

## 2013-06-30 MED ORDER — DOCUSATE SODIUM 100 MG PO CAPS
100.0000 mg | ORAL_CAPSULE | Freq: Two times a day (BID) | ORAL | Status: DC
  Administered 2013-07-01 – 2013-07-03 (×7): 100 mg via ORAL
  Filled 2013-06-30 (×9): qty 1

## 2013-06-30 MED ORDER — HEPARIN BOLUS VIA INFUSION
4000.0000 [IU] | Freq: Once | INTRAVENOUS | Status: AC
  Administered 2013-06-30: 4000 [IU] via INTRAVENOUS

## 2013-06-30 NOTE — Progress Notes (Signed)
PENDING ACCEPTANCE TRANFER NOTE:  Call received from:  Dr Tamera Punt of [facility].  REASON FOR REQUESTING TRANSFER: cardiology consult for ST elevation.  Elevated LFTs. Weakness and coughs.  HPI: Patient presents with SOB and weakness, but no chest pain. Having EKG with ST elevation, but no symptoms of STEMI.  Card consulted and plan is to do ECHO.  If WMA, then will go to Cath.  Card will see in consultation.  Medicine was asked to admit.  PLAN:  According to telephone report, this patient was accepted for transfer to SDU,   Under [facility] team:  10,  I have requested an order be written to call Flow Manager at [phone] upon patient arrival to the floor for final physician assignment who will do the admission and give admitting orders.  SIGNEDOrvan Falconer, MD [facility]  06/30/2013, 8:08 PM

## 2013-06-30 NOTE — Consult Note (Signed)
Admit date: 06/30/2013 Referring Physician  Dr. Marin Comment Primary Physician  Dr. Ernestine Conrad Primary Cardiologist  None Reason for Consultation  Scapular and chest pain with SOB  HPI: This is a 73yo WM with a history of type 2 DM, HTN, tobacco abuse and COPD who started having scapular pain across his shoulders off and on for the past 3 weeks. This was nonexertional.  He also noticed SOB with minimal exertion.  Over the past few days he had weakness and a cough and started developing rib pain from the coughing.  He went to see his PCP this am due to the above symptoms and ongoing shoulder pain.  A chest xray showed small bilateral pleural effusions and CM and EKG showed ? ST elevation in inferior leads and he was told to go to the ER. He went to [facility] med center and is now transferred to [facility].  His daughter states that he complained of 4/10 shoulder pain before leaving [facility] med center.  Currently he is pain free.  His BNP is elevated at 1400.       PMH:   Past Medical History  Diagnosis Date  . DM type 2 (diabetes mellitus, type 2)   . Hypertension   . Tobacco dependence     Prior failure to quit with wellbutrin.  100+ pack-yr history.  Quit for 1 mo to get hip replacement (1 pack/day)  . Obesity   . Hyperplastic colon polyp 2001  . Erectile dysfunction     Normal testosterone  . Shortness of breath     exertion  . Adenomatous colon polyp 10/16/2011    Repeat 2018  . 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 pylori neg, no dysplasia or malignancy.  . Macular degeneration, dry     Mild, bilat (Optometrist, Mayford Knife at [facility] in [place], [place])  . History of pneumonia     YRS AGO  . 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  . 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 COPD (chronic obstructive pulmonary disease)     Spirometry  2004 borderline  obstruction  . Hyperlipidemia   . CKD (chronic kidney disease), stage I   . Diabetic nephropathy     Elevated urine microalb/cr 03/2011     PSH:   Past Surgical History  Procedure Laterality Date  . Cholecystectomy open  1999  . Appendectomy  1957  . Hip surgery  age 73    Repairof slipped capital femoral epiphysis.  . Knee surgery  1979 or so    left; bolt + 3 screws to repair tib plateau fx  . Testicle surgery  as a child    Undescended testicle brought down into scrotum  . Tonsillectomy  1947  . Colonoscopy  10/16/2011    Procedure: COLONOSCOPY;  Surgeon: RInda Castle MD;  Location: [facility];  Service: Endoscopy;  Laterality: N/A;  . Cataract extraction w/ intraocular lens  implant, bilateral  04/08/2006 & 04/22/2006  . Esophagogastroduodenoscopy  02/25/12    Atrophic gastritis with a few erosions--capsule endoscopy planned as of 02/25/12 (Dr. KDeatra Ina.  . 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 Hardware  removal Right 08/12/2012    Procedure: HARDWARE REMOVAL;  Surgeon: Mcarthur Rossetti, MD;  Location: [facility];  Service: Orthopedics;  Laterality: Right;    Allergies:  Demerol; Morphine and related; and Starlix Prior to Admit Meds:   Prescriptions prior to admission  Medication Sig Dispense Refill  . aspirin 81 MG tablet Take 81 mg by mouth daily.      . budesonide-formoterol (SYMBICORT) 160-4.5 MCG/ACT inhaler Inhale 2 puffs into the lungs 2 (two) times daily.  1 Inhaler  12  . buPROPion (WELLBUTRIN XL) 150 MG 24 hr tablet Take 1 tablet (150 mg total) by mouth daily.  30 tablet  3  . chlorthalidone (HYGROTON) 25 MG tablet Take 1 tablet (25 mg total) by mouth daily.  30 tablet  10  . Cholecalciferol (VITAMIN D-3) 1000 UNITS CAPS Take 1 capsule by mouth daily.      . Chromium  Picolinate 1000 MCG TABS Take 1 tablet by mouth daily.      . Dapagliflozin Propanediol (FARXIGA) 5 MG TABS Take 5 mg by mouth daily.  30 tablet  5  . ferrous sulfate 325 (65 FE) MG tablet Take 325 mg by mouth daily with breakfast.      . glimepiride (AMARYL) 4 MG tablet Take 1 tablet (4 mg total) by mouth 2 (two) times daily.  180 tablet  1  . Liraglutide (VICTOZA) 18 MG/3ML SOPN Inject 1.8 mg into the skin daily.  9 mL  6  . lovastatin (MEVACOR) 40 MG tablet Take 1 tablet (40 mg total) by mouth 2 (two) times daily.  180 tablet  1  . metFORMIN (GLUCOPHAGE) 1000 MG tablet Take 1 tablet (1,000 mg total) by mouth 2 (two) times daily with a meal.  180 tablet  1  . Multiple Vitamin (MULTIVITAMIN WITH MINERALS) TABS Take 1 tablet by mouth daily.      . pioglitazone (ACTOS) 45 MG tablet Take 1 tablet (45 mg total) by mouth daily.  90 tablet  1  . ramipril (ALTACE) 10 MG capsule Take 1 capsule (10 mg total) by mouth 2 (two) times daily.  180 capsule  1   Fam HX:    Family History  Problem Relation Age of Onset  . Breast cancer Sister   . Diabetes Maternal Uncle   . Diabetes Paternal Grandmother   . Heart disease Father   . Heart disease Maternal Uncle   . CVA Mother    Social HX:    History   Social History  . Marital Status: Married    Spouse Name: [name]    Number of Children: 4  . Years of Education: N/A   Occupational History  . bus operator    Social History Main Topics  . Smoking status: Current Every Day Smoker -- 1.00 packs/day for 60 years    Types: Cigarettes  . Smokeless tobacco: Current User     Comment: snoos 2 Pkg. a day  NOT SMOKED SINCE 07/18/12  . Alcohol Use: No  . Drug Use: No  . Sexual Activity: Not on file   Other Topics Concern  . Not on file   Social History Narrative   Married, 4 children.   Formerly a Medical illustrator.   Local transportation bus driver.  Level of education: HS.     +tobacco--lifelong (still smoking as of 08/2011).  No  alcohol or drugs.   No exercise.  +excessive caffeine.     ROS:  All 11 ROS were addressed and are  negative except what is stated in the HPI  Physical Exam: Blood pressure 115/61, pulse 94, temperature 98 F (36.7 C), temperature source Oral, resp. rate 24, height _0  (1.803 m), weight 197 lb 15.6 oz (89.8 kg), SpO2 100.00%.    General: Well developed, well nourished, in no acute distress Head: Eyes PERRLA, No xanthomas.   Normal cephalic and atramatic  Lungs:  Diffuse expiratory wheezes Heart:   HRRR S1 S2 Pulses are 2+ & equal.            No carotid bruit. No JVD.  No abdominal bruits. No femoral bruits. Abdomen: Bowel sounds are positive, abdomen soft and non-tender without masses  Extremities:   No clubbing, cyanosis or edema.  DP +1 Neuro: Alert and oriented X 3. Psych:  Good affect, responds appropriately    Labs:   Lab Results  Component Value Date   WBC 9.7 06/30/2013   HGB 12.3* 06/30/2013   HCT 37.7* 06/30/2013   MCV 85.9 06/30/2013   PLT 242 06/30/2013    Recent Labs Lab 06/30/13 1825  NA 134*  K 4.2  CL 96  CO2 20  BUN 61*  CREATININE 1.10  CALCIUM 9.5  PROT 7.0  BILITOT 0.3  ALKPHOS 155*  ALT 66*  AST 27  GLUCOSE 395*   No results found for this basename: PTT   Lab Results  Component Value Date   INR 1.07 06/30/2013   INR 1.00 08/04/2012   Lab Results  Component Value Date   TROPONINI <0.30 06/30/2013     Lab Results  Component Value Date   CHOL 115 03/18/2012   Lab Results  Component Value Date   HDL 41.40 03/18/2012   Lab Results  Component Value Date   LDLCALC 50 03/18/2012   Lab Results  Component Value Date   TRIG 117.0 03/18/2012   Lab Results  Component Value Date   CHOLHDL 3 03/18/2012   No results found for this basename: LDLDIRECT      Radiology:  Dg Chest 2 View  06/30/2013   CLINICAL DATA:  cough and fatigue  EXAM: CHEST - 2 VIEW  COMPARISON:  07/07/2012  FINDINGS: Mild cardiomegaly. Small bilateral pleural effusions.  Adjacent consolidation or subsegmental atelectasis in the lung bases right greater than left. No overt interstitial edema. Atheromatous aorta. Surgical clips in the upper abdomen. Mild spurring in the thoracic spine.  IMPRESSION: Mild cardiomegaly and small pleural effusions, new since previous exam.   Electronically Signed   By: Arne Cleveland M.D.   On: 06/30/2013 13:13    EKG:  NSR with PAC's with ILBBB  ASSESSMENT:  1.  Atypical CP with normal troponin thus far.  EKG does not demonstrate a STEMI.  He is pain free at this time.  This could all be from acute COPD exacerbation but he has multiple CRF for CAD.   2.  Acute CHF with elevated BNP 3.  Cardiomegaly on chest xray 4.  HTN 5.  COPD with possible exacerbation with wheezing on exam and recent cough and SOB 6.  DM with elevated BS  PLAN:   1.  Lasix 75m IV now and then reassess in am 2.  2D echo to assess LVF in am 3.  Continue to cycle cardiac enzymes 4.  Check TSH 5.  Hold on beta blocker due to active wheezing on exam 6.  ASA daily 7.  IV Heparin gtt per pharmacy until rule out complete  TSueanne Margarita MD  06/30/2013  10:56  PM

## 2013-06-30 NOTE — ED Notes (Signed)
Report given to [facility] -approx 30 min ETA

## 2013-06-30 NOTE — Progress Notes (Addendum)
OFFICE NOTE  06/30/2013  CC:  Chief Complaint  Patient presents with  . Fatigue     HPI: Patient is a 73 y.o. Caucasian male who is here for feeling weak all over, no energy, onset about 3 wks ago.  All sx's worse the last 5d. Notes black/tarry stool about once a week for "quite a while".  No BRBPR.  Urine concentrated in morning, otherwise no urinary complaints.  No significant pain complaints.  Appetite is down.  Cough came on about same time as fatigue per pt, nonproductive, without SOB or wheezing.  No dizziness.  No chest pain.   Glucoses have been higher than his usual lately (of note, he did not start the [place] rx'd 06/08/13 due to cost issues). No fevers.  No headaches.    Pertinent PMH:  Past Medical History  Diagnosis Date  . DM type 2 (diabetes mellitus, type 2)   . Hypertension   . Tobacco dependence     Prior failure to quit with wellbutrin.  100+ pack-yr history.  Quit for 1 mo to get hip replacement (1 pack/day)  . Obesity   . Hyperplastic colon polyp 2001  . Erectile dysfunction     Normal testosterone  . Shortness of breath     exertion  . Adenomatous colon polyp 10/16/2011    Repeat 2018  . 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 pylori neg, no dysplasia or malignancy.  . Macular degeneration, dry     Mild, bilat (Optometrist, Mayford Knife at [facility] in [place], [place])  . History of pneumonia     YRS AGO  . Iron deficiency anemia 2014    03/2012 capsule endoscopy showed 2 AVMs--likely responsible for his IDA--lifetime iron supp recommended + q25moCBCs.  . 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 COPD (chronic obstructive pulmonary disease)     Spirometry  2004 borderline obstruction  . Hyperlipidemia   . CKD (chronic kidney disease), stage I   . Diabetic nephropathy     Elevated urine microalb/cr 03/2011   Past Surgical History  Procedure Laterality Date  . Cholecystectomy open  1999  . Appendectomy  1957   . Hip surgery  age 73    Repairof slipped capital femoral epiphysis.  . Knee surgery  1979 or so    left; bolt + 3 screws to repair tib plateau fx  . Testicle surgery  as a child    Undescended testicle brought down into scrotum  . Tonsillectomy  1947  . Colonoscopy  10/16/2011    Procedure: COLONOSCOPY;  Surgeon: RInda Castle MD;  Location: [facility];  Service: Endoscopy;  Laterality: N/A;  . Cataract extraction w/ intraocular lens  implant, bilateral  04/08/2006 & 04/22/2006  . Esophagogastroduodenoscopy  02/25/12    Atrophic gastritis with a few erosions--capsule endoscopy planned as of 02/25/12 (Dr. KDeatra Ina.  . 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 Hardware removal Right 08/12/2012    Procedure: HARDWARE REMOVAL;  Surgeon: CMcarthur Rossetti MD;  Location: [facility];  Service: Orthopedics;  Laterality: Right;    MEDS:  Outpatient Prescriptions Prior to Visit  Medication Sig Dispense Refill  . aspirin 81 MG tablet Take 81 mg by mouth daily.      .[name]  budesonide-formoterol (SYMBICORT) 160-4.5 MCG/ACT inhaler Inhale 2 puffs into the lungs 2 (two) times daily.  1 Inhaler  12  . buPROPion (WELLBUTRIN XL) 150 MG 24 hr tablet Take 1 tablet (150 mg total) by mouth daily.  30 tablet  3  . chlorthalidone (HYGROTON) 25 MG tablet Take 1 tablet (25 mg total) by mouth daily.  30 tablet  10  . Cholecalciferol (VITAMIN D-3) 1000 UNITS CAPS Take 1 capsule by mouth daily.      . Chromium Picolinate 1000 MCG TABS Take 1 tablet by mouth daily.      . Dapagliflozin Propanediol (FARXIGA) 5 MG TABS Take 5 mg by mouth daily.  30 tablet  5  . ferrous sulfate 325 (65 FE) MG tablet Take 325 mg by mouth daily with breakfast.      . glimepiride (AMARYL) 4 MG tablet Take 1 tablet (4 mg total) by mouth 2 (two) times  daily.  180 tablet  1  . Liraglutide (VICTOZA) 18 MG/3ML SOPN Inject 1.8 mg into the skin daily.  9 mL  6  . lovastatin (MEVACOR) 40 MG tablet Take 1 tablet (40 mg total) by mouth 2 (two) times daily.  180 tablet  1  . metFORMIN (GLUCOPHAGE) 1000 MG tablet Take 1 tablet (1,000 mg total) by mouth 2 (two) times daily with a meal.  180 tablet  1  . Multiple Vitamin (MULTIVITAMIN WITH MINERALS) TABS Take 1 tablet by mouth daily.      . pioglitazone (ACTOS) 45 MG tablet Take 1 tablet (45 mg total) by mouth daily.  90 tablet  1  . ramipril (ALTACE) 10 MG capsule Take 1 capsule (10 mg total) by mouth 2 (two) times daily.  180 capsule  1   No facility-administered medications prior to visit.    PE: Blood pressure 97/62, pulse 95, temperature 98 F (36.7 C), temperature source Temporal, height _0  (1.803 m), weight 201 lb (91.173 kg), SpO2 97.00%. Gen: alert, oriented x 4.  Lucid thought and speech. Moves slowly but without assistance. FXT:KWIO: no injection, icteris, swelling, or exudate.  EOMI, PERRLA. Mouth: lips without lesion/swelling.  Oral mucosa pink and moist. Oropharynx without erythema, exudate, or swelling.  Conjunctival and oral mucosa pallor. CV: RRR, no m/r/g LUNGS: question of subtle L>R basilar insp crackles, otherwise clear and with good aeration and nonlabored breathing.  LAB:  12 lead EKG today: NSR, rate 95, early repol in lateral leads.  No ectopy.  No acute ischemic changes. Cap Hb 10.2 today Lab Results  Component Value Date   WBC 9.5 09/01/2012   HGB 11.4* 09/01/2012   HCT 34.3* 09/01/2012   MCV 85.2 09/01/2012   PLT 335.0 09/01/2012    IMPRESSION AND PLAN:  Generalized weakness, cough--check CXR and CBC, CMET. 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 Poor control of DM 2 has been longstanding and doubtful that it explains the  way he feels today. EKG without worrisome abnormality.  No meds rx'd at this time.  FOLLOW UP: To be determined based on results of pending workup.  ADDENDUM 5pm 06/30/13: CXR returned showing mild CM with R>L pleural effusions, ? atelect vs consolidation in bases. With patient's risk for CAD being so high, I recommended he go to the ED now and get further eval (cardiac enzymes, repeat EKG, etc).  He expressed understanding  and agreed to go to the ED now.--PM

## 2013-06-30 NOTE — ED Provider Notes (Addendum)
CSN: [id number]     Arrival date & time 06/30/13  1800 History  This chart was scribed for Joshua Johns, MD by Vernell Barrier, ED scribe. This patient was seen in room MH12/MH12 and the patient's care was started at 6:29 PM.   Chief Complaint  Patient presents with  . Weakness    The history is provided by the patient and a friend. No language interpreter was used.   HPI Comments: Joshua [name] is a 73 y.o. male who presents to the Emergency Department complaining of generalized weakness onset 2-3 weeks ago. Reports SOB when out in the heat. Also reports dry cough for the past 2-3 weeks; some associated intermittent bilateral chest wall tenderness, only with coughing. Does not report pain currently or with breathing. States he will feel lightheaded if he stands up too fast. No hx of heart trouble; no cardiologist. Stress test completed approximately 15 years ago. Denies chest tightness, fever, nausea, vomiting, or dizziness.   Past Medical History  Diagnosis Date  . DM type 2 (diabetes mellitus, type 2)   . Hypertension   . Tobacco dependence     Prior failure to quit with wellbutrin.  100+ pack-yr history.  Quit for 1 mo to get hip replacement (1 pack/day)  . Obesity   . Hyperplastic colon polyp 2001  . Erectile dysfunction     Normal testosterone  . Shortness of breath     exertion  . Adenomatous colon polyp 10/16/2011    Repeat 2018  . 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 pylori neg, no dysplasia or malignancy.  . Macular degeneration, dry     Mild, bilat (Optometrist, Mayford Knife at [facility] [place] in [place], [place])  . History of pneumonia     YRS AGO  . 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  . 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 COPD (chronic obstructive pulmonary disease)     Spirometry  2004 borderline obstruction   . Hyperlipidemia   . CKD (chronic kidney disease), stage I   . Diabetic nephropathy     Elevated urine microalb/cr 03/2011   Past Surgical History  Procedure Laterality Date  . Cholecystectomy open  1999  . Appendectomy  1957  . Hip surgery  age 73    Repairof slipped capital femoral epiphysis.  . Knee surgery  1979 or so    left; bolt + 3 screws to repair tib plateau fx  . Testicle surgery  as a child    Undescended testicle brought down into scrotum  . Tonsillectomy  1947  . Colonoscopy  10/16/2011    Procedure: COLONOSCOPY;  Surgeon: RInda Castle MD;  Location: [facility];  Service: Endoscopy;  Laterality: N/A;  . Cataract extraction w/ intraocular lens  implant, bilateral  04/08/2006 & 04/22/2006  . Esophagogastroduodenoscopy  02/25/12    Atrophic gastritis with a few erosions--capsule endoscopy planned as of 02/25/12 (Dr. KDeatra Ina.  . 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 Hardware removal Right 08/12/2012    Procedure: HARDWARE REMOVAL;  Surgeon: CMcarthur Rossetti MD;  Location: [facility];  Service: Orthopedics;  Laterality: Right;  Family History  Problem Relation Age of Onset  . Breast cancer Sister   . Diabetes Maternal Uncle   . Diabetes Paternal Grandmother   . Heart disease Father   . Heart disease Maternal Uncle    History  Substance Use Topics  . Smoking status: Current Every Day Smoker -- 1.00 packs/day for 60 years    Types: Cigarettes  . Smokeless tobacco: Current User     Comment: snoos 2 Pkg. a day  NOT SMOKED SINCE 07/18/12  . Alcohol Use: No    Review of Systems  Constitutional: Negative for fever, chills, diaphoresis and fatigue.  HENT: Negative for congestion, rhinorrhea and sneezing.   Eyes: Negative.   Respiratory: Positive for cough and shortness  of breath. Negative for chest tightness.   Cardiovascular: Positive for chest pain (only at times with coughing). Negative for leg swelling.  Gastrointestinal: Negative for nausea, vomiting, abdominal pain, diarrhea and blood in stool.  Genitourinary: Negative for frequency, hematuria, flank pain and difficulty urinating.  Musculoskeletal: Negative for arthralgias and back pain.  Skin: Negative for rash.  Neurological: Positive for weakness. Negative for dizziness, speech difficulty, numbness and headaches.    Allergies  Demerol; Morphine and related; and Starlix  Home Medications   Prior to Admission medications   Medication Sig Start Date End Date Taking? Authorizing Provider  aspirin 81 MG tablet Take 81 mg by mouth daily.    Historical Provider, MD  budesonide-formoterol (SYMBICORT) 160-4.5 MCG/ACT inhaler Inhale 2 puffs into the lungs 2 (two) times daily. 06/08/13   Tammi Sou, MD  buPROPion (WELLBUTRIN XL) 150 MG 24 hr tablet Take 1 tablet (150 mg total) by mouth daily. 06/08/13   Tammi Sou, MD  chlorthalidone (HYGROTON) 25 MG tablet Take 1 tablet (25 mg total) by mouth daily. 12/06/12   Tammi Sou, MD  Cholecalciferol (VITAMIN D-3) 1000 UNITS CAPS Take 1 capsule by mouth daily.    Historical Provider, MD  Chromium Picolinate 1000 MCG TABS Take 1 tablet by mouth daily.    Historical Provider, MD  Dapagliflozin Propanediol (FARXIGA) 5 MG TABS Take 5 mg by mouth daily. 06/11/13   Tammi Sou, MD  ferrous sulfate 325 (65 FE) MG tablet Take 325 mg by mouth daily with breakfast.    Historical Provider, MD  glimepiride (AMARYL) 4 MG tablet Take 1 tablet (4 mg total) by mouth 2 (two) times daily. 06/26/13   Tammi Sou, MD  Liraglutide (VICTOZA) 18 MG/3ML SOPN Inject 1.8 mg into the skin daily. 06/11/13   Tammi Sou, MD  lovastatin (MEVACOR) 40 MG tablet Take 1 tablet (40 mg total) by mouth 2 (two) times daily. 04/17/13   Tammi Sou, MD  metFORMIN (GLUCOPHAGE)  1000 MG tablet Take 1 tablet (1,000 mg total) by mouth 2 (two) times daily with a meal. 04/17/13   Tammi Sou, MD  Multiple Vitamin (MULTIVITAMIN WITH MINERALS) TABS Take 1 tablet by mouth daily.    Historical Provider, MD  pioglitazone (ACTOS) 45 MG tablet Take 1 tablet (45 mg total) by mouth daily. 06/26/13   Tammi Sou, MD  ramipril (ALTACE) 10 MG capsule Take 1 capsule (10 mg total) by mouth 2 (two) times daily. 04/24/13   Tammi Sou, MD   Triage vitals: BP 136/64  Pulse 101  Temp(Src) 98.2 F (36.8 C) (Oral)  Resp 22  Ht _0  (1.803 m)  Wt 200 lb (90.719 kg)  BMI 27.91 kg/m2  SpO2 95%  Physical Exam  Nursing note and vitals reviewed. Constitutional: He is oriented to person, place, and time. He appears well-developed and well-nourished.  HENT:  Head: Normocephalic and atraumatic.  Eyes: Pupils are equal, round, and reactive to light.  Neck: Normal range of motion. Neck supple.  Cardiovascular: Normal rate, regular rhythm and normal heart sounds.   Pulmonary/Chest: Effort normal and breath sounds normal. No respiratory distress. He has no wheezes. He has no rales. He exhibits no tenderness.  Abdominal: Soft. Bowel sounds are normal. There is no tenderness. There is no rebound and no guarding.  Musculoskeletal: Normal range of motion. He exhibits edema.  Some slight bilateral pital edema.   Lymphadenopathy:    He has no cervical adenopathy.  Neurological: He is alert and oriented to person, place, and time.  Skin: Skin is warm and dry. No rash noted.  Psychiatric: He has a normal mood and affect.    ED Course  Procedures (including critical care time) DIAGNOSTIC STUDIES: Oxygen Saturation is 95% on room air, normal by my interpretation.    COORDINATION OF CARE: At 6:34 PM: Discussed treatment plan with patient which includes consultation with a cardiologist and blood work. Patient agrees.    Labs Review Labs Reviewed  CBC WITH DIFFERENTIAL - Abnormal;  Notable for the following:    Hemoglobin 12.3 (*)    HCT 37.7 (*)    Monocytes Relative 13 (*)    Monocytes Absolute 1.2 (*)    All other components within normal limits  COMPREHENSIVE METABOLIC PANEL - Abnormal; Notable for the following:    Sodium 134 (*)    Glucose, Bld 395 (*)    BUN 61 (*)    Albumin 2.8 (*)    ALT 66 (*)    Alkaline Phosphatase 155 (*)    GFR calc non Af Amer 64 (*)    GFR calc Af Amer 74 (*)    All other components within normal limits  PRO B NATRIURETIC PEPTIDE - Abnormal; Notable for the following:    Pro B Natriuretic peptide (BNP) 1443.0 (*)    All other components within normal limits  TROPONIN I  URINALYSIS, ROUTINE W REFLEX MICROSCOPIC    Imaging Review Dg Chest 2 View  06/30/2013   CLINICAL DATA:  cough and fatigue  EXAM: CHEST - 2 VIEW  COMPARISON:  07/07/2012  FINDINGS: Mild cardiomegaly. Small bilateral pleural effusions. Adjacent consolidation or subsegmental atelectasis in the lung bases right greater than left. No overt interstitial edema. Atheromatous aorta. Surgical clips in the upper abdomen. Mild spurring in the thoracic spine.  IMPRESSION: Mild cardiomegaly and small pleural effusions, new since previous exam.   Electronically Signed   By: Arne Cleveland M.D.   On: 06/30/2013 13:13     EKG Interpretation None      MDM   Final diagnoses:  ACS (acute coronary syndrome)    I personally performed the services described in this documentation, which was scribed in my presence. The recorded information has been reviewed and is accurate.   18:32 Pt with 23m ST elevation inferiorly/laterally, T wave inversion/flattening I, aVL.  Pt denies CP, SOB, nausea.  Will consult STEMI on call doc to see if he wants to activate Code Stemi  18:55: spoke with Dr. VScarlette Calicowho is reviewing EKG  Dr. VScarlette Calicohas reviewed EKG, I have spoken with pt again.  He does report increased fatigue today as compared to yesterday.  He has had some mild  intermittent pain to both shoulders, describes  very mild discomfort to right shoulder now.  Has negative troponin.  Giving duration of symptoms with negative troponin, will not activate Code STEMI, Dr. Scarlette Calico to arrange for pt to have ECHO tonight, if abnormal, will take him to cath lab.  Requests hospitalist to admit.  Will start ASA, heparin.  Pt has hx of GI bleed about one year ago due to AVM in [place].  Denies any recent blood in stool.  Occassionaly has some dark stools which he attributes to taking the iron, this has been going on for months and is unchanged.  Due to this, feel that with EKG changes, benefit of heparin outweighs risk, discussed with pt.  CRITICAL CARE Performed by: BELFI, MELANIE Total critical care time: 60 Critical care time was exclusive of separately billable procedures and treating other patients. Critical care was necessary to treat or prevent imminent or life-threatening deterioration. Critical care was time spent personally by me on the following activities: development of treatment plan with patient and/or surrogate as well as nursing, discussions with consultants, evaluation of patient's response to treatment, examination of patient, obtaining history from patient or surrogate, ordering and performing treatments and interventions, ordering and review of laboratory studies, ordering and review of radiographic studies, pulse oximetry and re-evaluation of patient's condition.   Joshua Johns, MD 06/30/13 [place], MD 06/30/13 2014

## 2013-06-30 NOTE — Progress Notes (Signed)
ANTICOAGULATION CONSULT NOTE - Initial Consult  Pharmacy Consult for Heparin Indication: chest pain/ACS  Allergies  Allergen Reactions  . Demerol [Meperidine] Nausea Only  . Morphine And Related Other (See Comments)    Drenched with perspiration  . Starlix [Nateglinide] Other (See Comments)    gassy    Patient Measurements: Height: _0  (180.3 cm) Weight: 200 lb (90.719 kg) IBW/kg (Calculated) : 75.3 Heparin Dosing Weight: n/a  Vital Signs: Temp: 98.2 F (36.8 C) (06/26 1807) Temp src: Oral (06/26 1807) BP: 109/77 mmHg (06/26 2130) Pulse Rate: 94 (06/26 2130)  Labs:  Recent Labs  06/30/13 1230 06/30/13 1825  HGB 12.3* 12.3*  HCT 37.3* 37.7*  PLT 254.0 242  APTT  --  36  LABPROT  --  13.9  INR  --  1.07  CREATININE 1.3 1.10  TROPONINI  --  <0.30    Estimated Creatinine Clearance: 67.9 ml/min (by C-G formula based on Cr of 1.1).   Medical History: Past Medical History  Diagnosis Date  . DM type 2 (diabetes mellitus, type 2)   . Hypertension   . Tobacco dependence     Prior failure to quit with wellbutrin.  100+ pack-yr history.  Quit for 1 mo to get hip replacement (1 pack/day)  . Obesity   . Hyperplastic colon polyp 2001  . Erectile dysfunction     Normal testosterone  . Shortness of breath     exertion  . Adenomatous colon polyp 10/16/2011    Repeat 2018  . 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 pylori neg, no dysplasia or malignancy.  . Macular degeneration, dry     Mild, bilat (Optometrist, Mayford Knife at [facility] [place] in [place], [place])  . History of pneumonia     YRS AGO  . 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  . 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 COPD (chronic obstructive pulmonary disease)     Spirometry  2004 borderline obstruction  . Hyperlipidemia   . CKD (chronic kidney  disease), stage I   . Diabetic nephropathy     Elevated urine microalb/cr 03/2011    Medications:  Prescriptions prior to admission  Medication Sig Dispense Refill  . aspirin 81 MG tablet Take 81 mg by mouth daily.      . budesonide-formoterol (SYMBICORT) 160-4.5 MCG/ACT inhaler Inhale 2 puffs into the lungs 2 (two) times daily.  1 Inhaler  12  . buPROPion (WELLBUTRIN XL) 150 MG 24 hr tablet Take 1 tablet (150 mg total) by mouth daily.  30 tablet  3  . chlorthalidone (HYGROTON) 25 MG tablet Take 1 tablet (25 mg total) by mouth daily.  30 tablet  10  . Cholecalciferol (VITAMIN D-3) 1000 UNITS CAPS Take 1 capsule by mouth daily.      . Chromium Picolinate 1000 MCG TABS Take 1 tablet by mouth daily.      . Dapagliflozin Propanediol (FARXIGA) 5 MG TABS Take 5 mg by mouth daily.  30 tablet  5  . ferrous sulfate 325 (65 FE) MG tablet Take 325 mg by mouth daily with breakfast.      . glimepiride (AMARYL) 4 MG tablet Take 1 tablet (4 mg total) by mouth 2 (two) times daily.  180 tablet  1  . Liraglutide (VICTOZA) 18 MG/3ML SOPN Inject 1.8 mg into the skin daily.  9 mL  6  . lovastatin (MEVACOR) 40 MG tablet Take 1 tablet (40 mg total) by mouth 2 (two) times daily.  180 tablet  1  . metFORMIN (GLUCOPHAGE) 1000 MG tablet Take 1 tablet (1,000 mg total) by mouth 2 (two) times daily with a meal.  180 tablet  1  . Multiple Vitamin (MULTIVITAMIN WITH MINERALS) TABS Take 1 tablet by mouth daily.      . pioglitazone (ACTOS) 45 MG tablet Take 1 tablet (45 mg total) by mouth daily.  90 tablet  1  . ramipril (ALTACE) 10 MG capsule Take 1 capsule (10 mg total) by mouth 2 (two) times daily.  180 capsule  1    Assessment: 73 YOM transferred to [facility] from [facility] for ST elevation with SOB and weakness. Cards planning to do ECHO. If WMA, then will go to cath. He was already started on a heparin infusion at 1100 units/hr with a bolus of 4000 units at 1930. H/H slightly low. Plt wnl.   Goal of Therapy:  Heparin level  0.3-0.7 units/ml Monitor platelets by anticoagulation protocol: Yes   Plan:  1) Continue heparin drip at 1100 units/hr 2) F/u 8 hour HL 3) Monitor daily HL, CBC, and s/s of bleeding.   Albertina Parr, PharmD.  Clinical Pharmacist Pager [phone]

## 2013-06-30 NOTE — Progress Notes (Signed)
Pre visit review using our clinic review tool, if applicable. No additional management support is needed unless otherwise documented below in the visit note.

## 2013-06-30 NOTE — ED Notes (Signed)
Pt sts his doctor called him today and said that the pt's heart was enlarged and he wanted him to come to the ER to get his enzymes checked. Pt denies CP or SOB but is feeling weak.

## 2013-06-30 NOTE — H&P (Signed)
[facility] History and Physical  Joshua [name] ZOX:[id number] DOB: 03-Nov-1940    PCP:   Tammi Sou, MD   Chief Complaint: weakness and URI symptoms.  HPI: Joshua [name] is an 73 y.o. male with hx of tobacco abuse, COPD, HTN, DM2, and hyperlipidemia, presents to his PCP with complain of weakness for the past 3 weeks.  His main complaint was that of weakness.  CXR was done showing ?small bilateral effusion, along with CM.  His EKG showed ? ST elevation.  He had no chest pain but did complain of bilateral shoulder pain, non exertional.  He was sent to [facility], where cardiology was consulted for ? STEMI.  It was not felt that his EKG represented STEMI, and he was transferred here for cardiology to see him.  He was started on IV Heparin.  Upon arrival, he was asymptomatic.  He did have a cough for the past 3 weeks, and audible wheezing, but no ill contact, distant travel, fever or chills.  His troponin was negative, BS was 300;'s and Cr 1.1.  His liver fx was only modestly elevated on statin. His one stress factor was that his children are getting a divorce. Hospitalist was asked to admit him for further work up.  I saw him with Dr Radford Pax in his room in the presence of his family.  Rewiew of Systems:  Constitutional: Negative for malaise, fever and chills. No significant weight loss or weight gain Eyes: Negative for eye pain, redness and discharge, diplopia, visual changes, or flashes of light. ENMT: Negative for ear pain, hoarseness, nasal congestion, sinus pressure and sore throat. No headaches; tinnitus, drooling, or problem swallowing. Cardiovascular: Negative for chest pain, palpitations, diaphoresis, and peripheral edema. ; No orthopnea, PND Respiratory: Negative for cough, hemoptysis, wheezing and stridor. No pleuritic chestpain. Gastrointestinal: Negative for nausea, vomiting, diarrhea, constipation, abdominal pain, melena, blood in stool, hematemesis, jaundice and rectal bleeding.     Genitourinary: Negative for frequency, dysuria, incontinence,flank pain and hematuria; Musculoskeletal: Negative for back pain and neck pain. Negative for swelling and trauma.;  Skin: . Negative for pruritus, rash, abrasions, bruising and skin lesion.; ulcerations Neuro: Negative for headache, lightheadedness and neck stiffness. Negative for weakness, altered level of consciousness , altered mental status, extremity weakness, burning feet, involuntary movement, seizure and syncope.  Psych: negative for anxiety, depression, insomnia, tearfulness, panic attacks, hallucinations, paranoia, suicidal or homicidal ideation    Past Medical History  Diagnosis Date  . DM type 2 (diabetes mellitus, type 2)   . Hypertension   . Tobacco dependence     Prior failure to quit with wellbutrin.  100+ pack-yr history.  Quit for 1 mo to get hip replacement (1 pack/day)  . Obesity   . Hyperplastic colon polyp 2001  . Erectile dysfunction     Normal testosterone  . Shortness of breath     exertion  . Adenomatous colon polyp 10/16/2011    Repeat 2018  . 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 pylori neg, no dysplasia or malignancy.  . Macular degeneration, dry     Mild, bilat (Optometrist, Mayford Knife at [facility] in [place], [place])  . History of pneumonia     YRS AGO  . Iron deficiency anemia 2014    03/2012 capsule endoscopy showed 2 AVMs--likely responsible for his IDA--lifetime iron supp recommended + q20moCBCs.  . 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 COPD (chronic obstructive pulmonary  disease)     Spirometry  2004 borderline obstruction  . Hyperlipidemia   . CKD (chronic kidney disease), stage I   . Diabetic nephropathy     Elevated urine microalb/cr 03/2011    Past Surgical History  Procedure Laterality Date  . Cholecystectomy open  1999  . Appendectomy  1957  . Hip surgery  age 56    Repair of slipped capital femoral epiphysis.  . Knee  surgery  1979 or so    left; bolt + 3 screws to repair tib plateau fx  . Testicle surgery  as a child    Undescended testicle brought down into scrotum  . Tonsillectomy  1947  . Colonoscopy  10/16/2011    Procedure: COLONOSCOPY;  Surgeon: Inda Castle, MD;  Location: [facility];  Service: Endoscopy;  Laterality: N/A;  . Cataract extraction w/ intraocular lens  implant, bilateral  04/08/2006 & 04/22/2006  . Esophagogastroduodenoscopy  02/25/12    Atrophic gastritis with a few erosions--capsule endoscopy planned as of 02/25/12 (Dr. Deatra Ina).  . Total hip arthroplasty Right 08/12/2012    Procedure: REMOVAL OF OLD PINS RIGHT HIP AND RIGHT TOTAL HIP ARTHROPLASTY ANTERIOR APPROACH;  Surgeon: Mcarthur Rossetti, MD;  Location: [facility] ORS;  Service: Orthopedics;  Laterality: Right;  . Hardware removal Right 08/12/2012    Procedure: HARDWARE REMOVAL;  Surgeon: Mcarthur Rossetti, MD;  Location: [facility] OR[facility];  Service: Orthopedics;  Laterality: Right;    Medications:  HOME MEDS: Prior to Admission medications   Medication Sig Start Date End Date Taking? Authorizing Provider  aspirin 81 MG tablet Take 81 mg by mouth daily.    Historical Provider, MD  budesonide-formoterol (SYMBICORT) 160-4.5 MCG/ACT inhaler Inhale 2 puffs into the lungs 2 (two) times daily. 06/08/13   Tammi Sou, MD  buPROPion (WELLBUTRIN XL) 150 MG 24 hr tablet Take 1 tablet (150 mg total) by mouth daily. 06/08/13   Tammi Sou, MD  chlorthalidone (HYGROTON) 25 MG tablet Take 1 tablet (25 mg total) by mouth daily. 12/06/12   Tammi Sou, MD  Cholecalciferol (VITAMIN D-3) 1000 UNITS CAPS Take 1 capsule by mouth daily.    Historical Provider, MD  Chromium Picolinate 1000 MCG TABS Take 1 tablet by mouth daily.    Historical Provider, MD  Dapagliflozin Propanediol (FARXIGA) 5 MG TABS Take 5 mg by mouth daily. 06/11/13   Tammi Sou, MD  ferrous sulfate 325 (65 FE) MG tablet Take 325 mg by mouth daily with breakfast.     Historical Provider, MD  glimepiride (AMARYL) 4 MG tablet Take 1 tablet (4 mg total) by mouth 2 (two) times daily. 06/26/13   Tammi Sou, MD  Liraglutide (VICTOZA) 18 MG/3ML SOPN Inject 1.8 mg into the skin daily. 06/11/13   Tammi Sou, MD  lovastatin (MEVACOR) 40 MG tablet Take 1 tablet (40 mg total) by mouth 2 (two) times daily. 04/17/13   Tammi Sou, MD  metFORMIN (GLUCOPHAGE) 1000 MG tablet Take 1 tablet (1,000 mg total) by mouth 2 (two) times daily with a meal. 04/17/13   Tammi Sou, MD  Multiple Vitamin (MULTIVITAMIN WITH MINERALS) TABS Take 1 tablet by mouth daily.    Historical Provider, MD  pioglitazone (ACTOS) 45 MG tablet Take 1 tablet (45 mg total) by mouth daily. 06/26/13   Tammi Sou, MD  ramipril (ALTACE) 10 MG capsule Take 1 capsule (10 mg total) by mouth 2 (two) times daily. 04/24/13   Tammi Sou, MD  Allergies:  Allergies  Allergen Reactions  . Demerol [Meperidine] Nausea Only  . Morphine And Related Other (See Comments)    Drenched with perspiration  . Starlix [Nateglinide] Other (See Comments)    gassy    Social History:   reports that he has been smoking Cigarettes.  He has a 60 pack-year smoking history. He uses smokeless tobacco. He reports that he does not drink alcohol or use illicit drugs.  Family History: Family History  Problem Relation Age of Onset  . Breast cancer Sister   . Diabetes Maternal Uncle   . Diabetes Paternal Grandmother   . Heart disease Father   . Heart disease Maternal Uncle   . CVA Mother      Physical Exam: Filed Vitals:   06/30/13 2100 06/30/13 2130 06/30/13 2225 06/30/13 2321  BP: 118/66 109/77 115/61   Pulse: 95 94    Temp:   98 F (36.7 C) 98.7 F (37.1 C)  TempSrc:   Oral Oral  Resp: _0 Height:   _1  (1.803 m)   Weight:   89.8 kg (197 lb 15.6 oz)   SpO2: 96% 96% 100%    Blood pressure 115/61, pulse 94, temperature 98.7 F (37.1 C), temperature source Oral, resp. rate 24,  height _2  (1.803 m), weight 89.8 kg (197 lb 15.6 oz), SpO2 100.00%.  GEN:  Pleasant  patient lying in the stretcher in no acute distress; cooperative with exam. PSYCH:  alert and oriented x4; does not appear anxious or depressed; affect is appropriate. HEENT: Mucous membranes pink and anicteric; PERRLA; EOM intact; no cervical lymphadenopathy nor thyromegaly or carotid bruit; no JVD; There were no stridor. Neck is very supple. Breasts:: Not examined CHEST WALL: No tenderness CHEST: Normal respiration, He has bilateral wheezing with no rales. HEART: Regular rate and rhythm.  There are no murmur, rub, or gallops.   BACK: No kyphosis or scoliosis; no CVA tenderness ABDOMEN: soft and non-tender; no masses, no organomegaly, normal abdominal bowel sounds; no pannus; no intertriginous candida. There is no rebound and no distention. Rectal Exam: Not done EXTREMITIES: No bone or joint deformity; age-appropriate arthropathy of the hands and knees; no edema; no ulcerations.  There is no calf tenderness. Genitalia: not examined PULSES: 2+ and symmetric SKIN: Normal hydration no rash or ulceration CNS: Cranial nerves 2-12 grossly intact no focal lateralizing neurologic deficit.  Speech is fluent; uvula elevated with phonation, facial symmetry and tongue midline. DTR are normal bilaterally, cerebella exam is intact, barbinski is negative and strengths are equaled bilaterally.  No sensory loss.   Labs on Admission:  Basic Metabolic Panel:  Recent Labs Lab 06/30/13 1230 06/30/13 1825  NA 132* 134*  K 4.8 4.2  CL 100 96  CO2 20 20  GLUCOSE 440* 395*  BUN 59* 61*  CREATININE 1.3 1.10  CALCIUM 8.9 9.5   Liver Function Tests:  Recent Labs Lab 06/30/13 1230 06/30/13 1825  AST 35 27  ALT 72* 66*  ALKPHOS 140* 155*  BILITOT 0.5 0.3  PROT 6.3 7.0  ALBUMIN 2.8* 2.8*   No results found for this basename: LIPASE, AMYLASE,  in the last 168 hours No results found for this basename: AMMONIA,   in the last 168 hours CBC:  Recent Labs Lab 06/30/13 1230 06/30/13 1825  WBC 9.7 9.7  NEUTROABS 7.6 7.1  HGB 12.3* 12.3*  HCT 37.3* 37.7*  MCV 85.0 85.9  PLT 254.0 242   Cardiac Enzymes:  Recent Labs Lab  06/30/13 1825  TROPONINI <0.30    CBG: No results found for this basename: GLUCAP,  in the last 168 hours   Radiological Exams on Admission: Dg Chest 2 View  06/30/2013   CLINICAL DATA:  cough and fatigue  EXAM: CHEST - 2 VIEW  COMPARISON:  07/07/2012  FINDINGS: Mild cardiomegaly. Small bilateral pleural effusions. Adjacent consolidation or subsegmental atelectasis in the lung bases right greater than left. No overt interstitial edema. Atheromatous aorta. Surgical clips in the upper abdomen. Mild spurring in the thoracic spine.  IMPRESSION: Mild cardiomegaly and small pleural effusions, new since previous exam.   Electronically Signed   By: Arne Cleveland M.D.   On: 06/30/2013 13:13    EKG: Independently reviewed. Slight ST segment elevations in the inferolateral leads.   Assessment/Plan Present on Admission:  . ACS (acute coronary syndrome) . Fatigue . HTN (hypertension), benign . Type II or unspecified type diabetes mellitus without mention of complication, uncontrolled . Tobacco dependence . Hyperlipidemia . COPD (chronic obstructive pulmonary disease) . CKD (chronic kidney disease), stage I . Nonspecific abnormal electrocardiogram (ECG) (EKG) . COPD with exacerbation   PLAN:  I don't think he has a presentation of a STEMI, nor ACS.  Cardiology has seen him and felt the same way.  I do think he may have COPD exacerbation, with coughs, some SOB, and audible wheezes.  Will Tx with neb Tx, IV steroids, and IV antibiotics.  Regarding his cardiac work up, Dr Radford Pax recommended one dose IV Lasix tonight, continue IV Heparin until r/out, and obtain ECHO.  For his hyperlipidemia, will continue his statin.  His LFTs were only slightly above normal.  For his DM, will use  resistant SSI.  I think his fatigue may be multifactorial including stress, COPD exacerbation, and /or a viral syndrome.  He is stable, full code, and will be admitted to SDU under [facility] service.  Thank you for allowing me to participate in his care.  Other plans as per orders.  Code Status: FULL Haskel Khan, MD. [facility] Pager [phone] 7pm to 7am.  06/30/2013, 11:25 PM

## 2013-07-01 ENCOUNTER — Other Ambulatory Visit: Payer: Self-pay

## 2013-07-01 ENCOUNTER — Encounter (HOSPITAL_COMMUNITY)
Admission: EM | Disposition: A | Discharge status: Home / Self Care | Payer: Self-pay | Source: Home / Self Care | Attending: Internal Medicine

## 2013-07-01 ENCOUNTER — Encounter (HOSPITAL_COMMUNITY): Payer: Self-pay | Admitting: *Deleted

## 2013-07-01 DIAGNOSIS — R9389 Abnormal findings on diagnostic imaging of other specified body structures: Secondary | ICD-10-CM

## 2013-07-01 DIAGNOSIS — I517 Cardiomegaly: Secondary | ICD-10-CM

## 2013-07-01 DIAGNOSIS — E669 Obesity, unspecified: Secondary | ICD-10-CM

## 2013-07-01 DIAGNOSIS — I314 Cardiac tamponade: Secondary | ICD-10-CM

## 2013-07-01 DIAGNOSIS — I4729 Other ventricular tachycardia: Secondary | ICD-10-CM

## 2013-07-01 DIAGNOSIS — R5381 Other malaise: Secondary | ICD-10-CM

## 2013-07-01 DIAGNOSIS — I319 Disease of pericardium, unspecified: Secondary | ICD-10-CM

## 2013-07-01 DIAGNOSIS — I472 Ventricular tachycardia: Secondary | ICD-10-CM

## 2013-07-01 DIAGNOSIS — I10 Essential (primary) hypertension: Secondary | ICD-10-CM

## 2013-07-01 DIAGNOSIS — I313 Pericardial effusion (noninflammatory): Secondary | ICD-10-CM | POA: Diagnosis present

## 2013-07-01 DIAGNOSIS — I4891 Unspecified atrial fibrillation: Secondary | ICD-10-CM

## 2013-07-01 DIAGNOSIS — J90 Pleural effusion, not elsewhere classified: Secondary | ICD-10-CM

## 2013-07-01 DIAGNOSIS — R5383 Other fatigue: Secondary | ICD-10-CM

## 2013-07-01 DIAGNOSIS — I3139 Other pericardial effusion (noninflammatory): Secondary | ICD-10-CM | POA: Diagnosis present

## 2013-07-01 DIAGNOSIS — IMO0001 Reserved for inherently not codable concepts without codable children: Secondary | ICD-10-CM

## 2013-07-01 DIAGNOSIS — R9431 Abnormal electrocardiogram [ECG] [EKG]: Secondary | ICD-10-CM

## 2013-07-01 DIAGNOSIS — E1165 Type 2 diabetes mellitus with hyperglycemia: Secondary | ICD-10-CM

## 2013-07-01 DIAGNOSIS — M546 Pain in thoracic spine: Secondary | ICD-10-CM

## 2013-07-01 DIAGNOSIS — E785 Hyperlipidemia, unspecified: Secondary | ICD-10-CM

## 2013-07-01 DIAGNOSIS — J209 Acute bronchitis, unspecified: Secondary | ICD-10-CM | POA: Diagnosis present

## 2013-07-01 HISTORY — PX: PERICARDIAL TAP: SHX5486

## 2013-07-01 LAB — BASIC METABOLIC PANEL
BUN: 57 mg/dL — ABNORMAL HIGH (ref 6–23)
CALCIUM: 8.8 mg/dL (ref 8.4–10.5)
CO2: 20 mEq/L (ref 19–32)
Chloride: 99 mEq/L (ref 96–112)
Creatinine, Ser: 0.98 mg/dL (ref 0.50–1.35)
GFR calc non Af Amer: 79 mL/min — ABNORMAL LOW (ref >90)
GLUCOSE: 196 mg/dL — AB (ref 70–99)
POTASSIUM: 4.4 meq/L (ref 3.7–5.3)
SODIUM: 138 meq/L (ref 137–147)

## 2013-07-01 LAB — GLUCOSE, CAPILLARY
GLUCOSE-CAPILLARY: 323 mg/dL — AB (ref 70–99)
GLUCOSE-CAPILLARY: 465 mg/dL — AB (ref 70–99)
Glucose-Capillary: 220 mg/dL — ABNORMAL HIGH (ref 70–99)
Glucose-Capillary: 193 mg/dL — ABNORMAL HIGH (ref 70–99)
Glucose-Capillary: 293 mg/dL — ABNORMAL HIGH (ref 70–99)

## 2013-07-01 LAB — MRSA PCR SCREENING: MRSA by PCR: NEGATIVE

## 2013-07-01 LAB — HEMOGLOBIN A1C
HEMOGLOBIN A1C: 10.3 % — AB (ref <5.7)
Mean Plasma Glucose: 249 mg/dL — ABNORMAL HIGH (ref <117)

## 2013-07-01 LAB — PH, BODY FLUID: pH, Fluid: 7.5

## 2013-07-01 LAB — URINALYSIS, ROUTINE W REFLEX MICROSCOPIC
Bilirubin Urine: NEGATIVE
Glucose, UA: 250 mg/dL — AB
KETONES UR: NEGATIVE mg/dL
LEUKOCYTES UA: NEGATIVE
NITRITE: NEGATIVE
Protein, ur: NEGATIVE mg/dL
SPECIFIC GRAVITY, URINE: 1.02 (ref 1.005–1.030)
UROBILINOGEN UA: 0.2 mg/dL (ref 0.0–1.0)
pH: 5 (ref 5.0–8.0)

## 2013-07-01 LAB — CBC
HEMATOCRIT: 37.5 % — AB (ref 39.0–52.0)
HEMOGLOBIN: 12.3 g/dL — AB (ref 13.0–17.0)
MCH: 28.1 pg (ref 26.0–34.0)
MCHC: 32.8 g/dL (ref 30.0–36.0)
MCV: 85.6 fL (ref 78.0–100.0)
Platelets: 230 10*3/uL (ref 150–400)
RBC: 4.38 MIL/uL (ref 4.22–5.81)
RDW: 14.4 % (ref 11.5–15.5)
WBC: 9.3 10*3/uL (ref 4.0–10.5)

## 2013-07-01 LAB — TROPONIN I
Troponin I: <0.3 ng/mL (ref <0.30)
Troponin I: <0.3 ng/mL (ref <0.30)

## 2013-07-01 LAB — URINE MICROSCOPIC-ADD ON

## 2013-07-01 LAB — LACTATE DEHYDROGENASE, PLEURAL OR PERITONEAL FLUID: LD, Fluid: 552 U/L — ABNORMAL HIGH (ref 3–23)

## 2013-07-01 LAB — AMYLASE, PLEURAL FLUID: Amylase, Pleural Fluid: 5 U/L

## 2013-07-01 LAB — BODY FLUID CELL COUNT WITH DIFFERENTIAL
Lymphs, Fluid: 5 %
MONOCYTE-MACROPHAGE-SEROUS FLUID: 19 % — AB (ref 50–90)
NEUTROPHIL FLUID: 76 % — AB (ref 0–25)
Total Nucleated Cell Count, Fluid: 1676 cu mm — ABNORMAL HIGH (ref 0–1000)

## 2013-07-01 LAB — TSH: TSH: 0.918 u[IU]/mL (ref 0.350–4.500)

## 2013-07-01 LAB — PROTEIN, BODY FLUID: Total protein, fluid: 4.7 g/dL

## 2013-07-01 LAB — GLUCOSE, SEROUS FLUID: GLUCOSE FL: 292 mg/dL

## 2013-07-01 LAB — HEPARIN LEVEL (UNFRACTIONATED)
Heparin Unfractionated: <0.1 IU/mL — ABNORMAL LOW (ref 0.30–0.70)
Heparin Unfractionated: <0.1 IU/mL — ABNORMAL LOW (ref 0.30–0.70)

## 2013-07-01 LAB — CREATININE, FLUID (PLEURAL, PERITONEAL, JP DRAINAGE): Creat, Fluid: 0.9 mg/dL

## 2013-07-01 SURGERY — PERICARDIAL TAP
Anesthesia: LOCAL

## 2013-07-01 MED ORDER — HEPARIN SODIUM (PORCINE) 5000 UNIT/ML IJ SOLN
5000.0000 [IU] | Freq: Three times a day (TID) | INTRAMUSCULAR | Status: DC
  Administered 2013-07-01 – 2013-07-04 (×9): 5000 [IU] via SUBCUTANEOUS
  Filled 2013-07-01 (×11): qty 1

## 2013-07-01 MED ORDER — ATORVASTATIN CALCIUM 10 MG PO TABS
10.0000 mg | ORAL_TABLET | Freq: Every day | ORAL | Status: DC
  Administered 2013-07-01 – 2013-07-04 (×4): 10 mg via ORAL
  Filled 2013-07-01 (×5): qty 1

## 2013-07-01 MED ORDER — METHYLPREDNISOLONE SODIUM SUCC 125 MG IJ SOLR
60.0000 mg | Freq: Two times a day (BID) | INTRAMUSCULAR | Status: DC
  Administered 2013-07-01 – 2013-07-02 (×2): 60 mg via INTRAVENOUS
  Filled 2013-07-01: qty 0.96
  Filled 2013-07-01: qty 2
  Filled 2013-07-01 (×3): qty 0.96

## 2013-07-01 MED ORDER — IPRATROPIUM BROMIDE 0.02 % IN SOLN
RESPIRATORY_TRACT | Status: AC
  Filled 2013-07-01: qty 2.5

## 2013-07-01 MED ORDER — LIVING WELL WITH DIABETES BOOK
Freq: Once | Status: AC
  Administered 2013-07-01: 15:00:00
  Filled 2013-07-01: qty 1

## 2013-07-01 MED ORDER — DILTIAZEM LOAD VIA INFUSION
5.0000 mg | Freq: Once | INTRAVENOUS | Status: AC
  Administered 2013-07-01: 10 mg via INTRAVENOUS
  Filled 2013-07-01: qty 15

## 2013-07-01 MED ORDER — DAPAGLIFLOZIN PROPANEDIOL 5 MG PO TABS: 5.0000 mg | ORAL_TABLET | Freq: Every day | ORAL | Status: DC

## 2013-07-01 MED ORDER — ACETAMINOPHEN 325 MG PO TABS: 650.0000 mg | ORAL_TABLET | ORAL | Status: DC | PRN

## 2013-07-01 MED ORDER — IPRATROPIUM BROMIDE 0.02 % IN SOLN
0.5000 mg | Freq: Four times a day (QID) | RESPIRATORY_TRACT | Status: DC
  Administered 2013-07-01 – 2013-07-04 (×12): 0.5 mg via RESPIRATORY_TRACT
  Filled 2013-07-01 (×14): qty 2.5

## 2013-07-01 MED ORDER — MIDAZOLAM HCL 2 MG/2ML IJ SOLN
INTRAMUSCULAR | Status: AC
  Filled 2013-07-01: qty 2

## 2013-07-01 MED ORDER — INSULIN ASPART 100 UNIT/ML ~~LOC~~ SOLN
0.0000 [IU] | Freq: Three times a day (TID) | SUBCUTANEOUS | Status: DC
  Administered 2013-07-01: 15 [IU] via SUBCUTANEOUS
  Administered 2013-07-02 (×2): 20 [IU] via SUBCUTANEOUS
  Administered 2013-07-02 – 2013-07-03 (×2): 11 [IU] via SUBCUTANEOUS
  Administered 2013-07-03: 15 [IU] via SUBCUTANEOUS
  Administered 2013-07-03: 7 [IU] via SUBCUTANEOUS
  Administered 2013-07-04: 4 [IU] via SUBCUTANEOUS
  Administered 2013-07-04: 11 [IU] via SUBCUTANEOUS
  Administered 2013-07-04: 20 [IU] via SUBCUTANEOUS

## 2013-07-01 MED ORDER — FENTANYL CITRATE 0.05 MG/ML IJ SOLN
INTRAMUSCULAR | Status: AC
  Filled 2013-07-01: qty 2

## 2013-07-01 MED ORDER — DILTIAZEM HCL 100 MG IV SOLR
5.0000 mg/h | INTRAVENOUS | Status: DC
  Administered 2013-07-01: 5 mg/h via INTRAVENOUS
  Filled 2013-07-01: qty 100

## 2013-07-01 MED ORDER — LEVALBUTEROL HCL 0.63 MG/3ML IN NEBU
0.6300 mg | INHALATION_SOLUTION | Freq: Four times a day (QID) | RESPIRATORY_TRACT | Status: DC
  Administered 2013-07-01 – 2013-07-04 (×12): 0.63 mg via RESPIRATORY_TRACT
  Filled 2013-07-01 (×26): qty 3

## 2013-07-01 MED ORDER — HEPARIN (PORCINE) IN NACL 100-0.45 UNIT/ML-% IJ SOLN
1300.0000 [IU]/h | INTRAMUSCULAR | Status: DC
  Administered 2013-07-01: 1300 [IU]/h via INTRAVENOUS
  Filled 2013-07-01 (×3): qty 250

## 2013-07-01 MED ORDER — INSULIN ASPART 100 UNIT/ML ~~LOC~~ SOLN
20.0000 [IU] | Freq: Once | SUBCUTANEOUS | Status: AC
Start: 2013-07-01 — End: 2013-07-01
  Administered 2013-07-01: 20 [IU] via SUBCUTANEOUS

## 2013-07-01 MED ORDER — ONDANSETRON HCL 4 MG/2ML IJ SOLN: 4.0000 mg | Freq: Four times a day (QID) | INTRAMUSCULAR | Status: DC | PRN

## 2013-07-01 MED ORDER — LEVALBUTEROL HCL 0.63 MG/3ML IN NEBU
INHALATION_SOLUTION | RESPIRATORY_TRACT | Status: AC
  Filled 2013-07-01: qty 3

## 2013-07-01 MED ORDER — DILTIAZEM HCL 30 MG PO TABS
30.0000 mg | ORAL_TABLET | Freq: Four times a day (QID) | ORAL | Status: DC
  Filled 2013-07-01 (×4): qty 1

## 2013-07-01 MED ORDER — LEVOFLOXACIN 500 MG PO TABS
500.0000 mg | ORAL_TABLET | Freq: Every day | ORAL | Status: DC
  Administered 2013-07-01 – 2013-07-04 (×4): 500 mg via ORAL
  Filled 2013-07-01 (×5): qty 1

## 2013-07-01 MED ORDER — GUAIFENESIN-DM 100-10 MG/5ML PO SYRP
5.0000 mL | ORAL_SOLUTION | ORAL | Status: DC | PRN
  Administered 2013-07-02 (×2): 5 mL via ORAL
  Filled 2013-07-01 (×2): qty 5

## 2013-07-01 MED ORDER — LIRAGLUTIDE 18 MG/3ML ~~LOC~~ SOPN
1.8000 mg | PEN_INJECTOR | Freq: Every day | SUBCUTANEOUS | Status: DC
  Administered 2013-07-02 – 2013-07-04 (×3): 1.8 mg via SUBCUTANEOUS

## 2013-07-01 NOTE — Progress Notes (Addendum)
SUBJECTIVE: Pt says shortness of breath is no better than yesterday. Denies chest pain and palpitations. Has had pain in b/l infrascapular regions intermittently over past one week with progressive fatigue over past 3 weeks. Currently in sinus rhythm, but with runs of NSVT. Had stress test several years ago. Echo recently completed and pending.     Intake/Output Summary (Last 24 hours) at 07/01/13 1229 Last data filed at 07/01/13 1200  Gross per 24 hour  Intake 410.73 ml  Output    900 ml  Net -489.27 ml    Current Facility-Administered Medications  Medication Dose Route Frequency Provider Last Rate Last Dose  . 0.9 %  sodium chloride infusion  250 mL Intravenous PRN Orvan Falconer, MD      . aspirin EC tablet 81 mg  81 mg Oral Daily Orvan Falconer, MD   81 mg at 07/01/13 0925  . atorvastatin (LIPITOR) tablet 10 mg  10 mg Oral q1800 Delfina Redwood, MD      . budesonide-formoterol [facility]) 160-4.5 MCG/ACT inhaler 2 puff  2 puff Inhalation BID Orvan Falconer, MD   2 puff at 07/01/13 0802  . buPROPion (WELLBUTRIN XL) 24 hr tablet 150 mg  150 mg Oral Daily Orvan Falconer, MD   150 mg at 07/01/13 0924  . chlorthalidone (HYGROTON) tablet 25 mg  25 mg Oral Daily Orvan Falconer, MD   25 mg at 07/01/13 0925  . docusate sodium (COLACE) capsule 100 mg  100 mg Oral BID Orvan Falconer, MD   100 mg at 07/01/13 0925  . glimepiride (AMARYL) tablet 4 mg  4 mg Oral BID WC Orvan Falconer, MD   4 mg at 07/01/13 0824  . guaiFENesin-dextromethorphan (ROBITUSSIN DM) 100-10 MG/5ML syrup 5 mL  5 mL Oral Q4H PRN Delfina Redwood, MD      . heparin ADULT infusion 100 units/mL (25000 units/250 mL)  1,300 Units/hr Intravenous Continuous Giani Betzold, RPH 13 mL/hr at 07/01/13 1205 1,300 Units/hr at 07/01/13 1205  . insulin aspart (novoLOG) injection 0-20 Units  0-20 Units Subcutaneous TID WC Delfina Redwood, MD   15 Units at 07/01/13 1218  . ipratropium (ATROVENT) nebulizer solution 0.5 mg  0.5 mg Nebulization QID Delfina Redwood, MD    0.5 mg at 07/01/13 0951  . levalbuterol (XOPENEX) nebulizer solution 0.63 mg  0.63 mg Nebulization QID Delfina Redwood, MD   0.63 mg at 07/01/13 0949  . levofloxacin (LEVAQUIN) tablet 500 mg  500 mg Oral Daily Delfina Redwood, MD      . Liraglutide SOPN 1.8 mg  1.8 mg Subcutaneous Daily Delfina Redwood, MD      . living well with diabetes book MISC   Does not apply Once Delfina Redwood, MD      . metFORMIN (GLUCOPHAGE) tablet 1,000 mg  1,000 mg Oral BID WC Orvan Falconer, MD   1,000 mg at 07/01/13 0824  . methylPREDNISolone sodium succinate (SOLU-MEDROL) 125 mg/2 mL injection 60 mg  60 mg Intravenous Q12H Delfina Redwood, MD      . multivitamin with minerals tablet 1 tablet  1 tablet Oral Daily Orvan Falconer, MD   1 tablet at 07/01/13 0925  . ondansetron (ZOFRAN) tablet 4 mg  4 mg Oral Q6H PRN Orvan Falconer, MD       Or  . ondansetron [facility]) injection 4 mg  4 mg Intravenous Q6H PRN Orvan Falconer, MD      . ramipril (ALTACE) capsule 10  mg  10 mg Oral BID Orvan Falconer, MD   10 mg at 07/01/13 1747  . simvastatin (ZOCOR) tablet 20 mg  20 mg Oral q1800 Orvan Falconer, MD      . sodium chloride 0.9 % injection 3 mL  3 mL Intravenous Q12H Orvan Falconer, MD   3 mL at 07/01/13 1209  . sodium chloride 0.9 % injection 3 mL  3 mL Intravenous Q12H Orvan Falconer, MD   3 mL at 07/01/13 0927  . sodium chloride 0.9 % injection 3 mL  3 mL Intravenous PRN Orvan Falconer, MD        Filed Vitals:   07/01/13 0900 07/01/13 0953 07/01/13 1100 07/01/13 1155  BP: 120/52  128/49 124/49  Pulse: 80  84 82  Temp:    98.2 F (36.8 C)  TempSrc:    Oral  Resp: _0 Height:      Weight:      SpO2: 95% 99% 95% 94%    PHYSICAL EXAM General: NAD Neck: No JVD, no thyromegaly.  Lungs: Diminished at bases b/l with dullness to percussion at bases, no rales or wheezes. CV: Nondisplaced PMI.  Regular rate and rhythm, normal S1/S2, no S3/S4, no murmur.  No pretibial edema.  No carotid bruit.  Normal pedal pulses.  Abdomen: Soft, nontender, no  hepatosplenomegaly, no distention.  Neurologic: Alert and oriented x 3.  Psych: Normal affect. Extremities: No clubbing or cyanosis.   TELEMETRY: Reviewed telemetry pt in sinus rhythm with PAC's and PVC's, 8-beat run of NSVT  LABS: Basic Metabolic Panel:  Recent Labs  06/30/13 1825 07/01/13 0557  NA 134* 138  K 4.2 4.4  CL 96 99  CO2 20 20  GLUCOSE 395* 196*  BUN 61* 57*  CREATININE 1.10 0.98  CALCIUM 9.5 8.8   Liver Function Tests:  Recent Labs  06/30/13 1230 06/30/13 1825  AST 35 27  ALT 72* 66*  ALKPHOS 140* 155*  BILITOT 0.5 0.3  PROT 6.3 7.0  ALBUMIN 2.8* 2.8*   No results found for this basename: LIPASE, AMYLASE,  in the last 72 hours CBC:  Recent Labs  06/30/13 1230 06/30/13 1825 07/01/13 0019  WBC 9.7 9.7 9.3  NEUTROABS 7.6 7.1  --   HGB 12.3* 12.3* 12.3*  HCT 37.3* 37.7* 37.5*  MCV 85.0 85.9 85.6  PLT 254.0 242 230   Cardiac Enzymes:  Recent Labs  06/30/13 1825 07/01/13 0019 07/01/13 0557  TROPONINI <0.30 <0.30 <0.30   BNP: No components found with this basename: POCBNP,  D-Dimer: No results found for this basename: DDIMER,  in the last 72 hours Hemoglobin A1C: No results found for this basename: HGBA1C,  in the last 72 hours Fasting Lipid Panel: No results found for this basename: CHOL, HDL, LDLCALC, TRIG, CHOLHDL, LDLDIRECT,  in the last 72 hours Thyroid Function Tests:  Recent Labs  07/01/13 0019  TSH 0.918   Anemia Panel:  Recent Labs  06/30/13 1230  FERRITIN 200.6  IRON 21*    RADIOLOGY: Dg Chest 2 View  06/30/2013   CLINICAL DATA:  cough and fatigue  EXAM: CHEST - 2 VIEW  COMPARISON:  07/07/2012  FINDINGS: Mild cardiomegaly. Small bilateral pleural effusions. Adjacent consolidation or subsegmental atelectasis in the lung bases right greater than left. No overt interstitial edema. Atheromatous aorta. Surgical clips in the upper abdomen. Mild spurring in the thoracic spine.  IMPRESSION: Mild cardiomegaly and small  pleural effusions, new since previous exam.   Electronically Signed  By: Arne Cleveland M.D.   On: 06/30/2013 13:13      ASSESSMENT AND PLAN: 1. Weakness, b/l infrascapular pain, shortness of breath and fatigue in setting of multiple cardiovascular risk factors along with abnormal ECG, NSVT, and elevated BNP: I am concerned the patient has occult ischemic heart disease. I will follow up on the results of echocardiography to see if he has depressed LV systolic function and/or regional wall motion abnormalities. In spite of normal troponin, pt may benefit from coronary angiography. Continue ASA and statin.  2. Atrial fibrillation: Has since converted to normal sinus rhythm, and remains on heparin. His CHADS-VASC score is at least 3 (HTN, age, diabetes) and would benefit from longterm anticoagulation for CVA prevention. Could consider target-specific oral anticoagulant but would not initiate until decision regarding coronary angiography is made. Would not start beta blockers given COPD exacerbation.  3. HTN: Controlled on current therapy.  4. Elevated BNP: No pedal or pulmonary edema. Could reflect elevated filling pressures and also known to be elevated with ischemic heart disease. Will f/u echo. No indication for Lasix at present.  5. COPD exacerbation: Continue current treatments.   Kate Sable, M.D., F.A.C.C.  ADDENDUM: Informed by Dr. Wynonia Lawman of large pericardial effusion with tamponade physiology. Discussed case with Dr. Casandra Doffing who plans to perform pericardiocentesis.

## 2013-07-01 NOTE — Progress Notes (Signed)
Echo Lab  2D Echocardiogram completed.  [place], RDCS 07/01/2013 11:39 AM

## 2013-07-01 NOTE — Progress Notes (Signed)
Called to evaluate patient for large effusion.  He has had fatigue over the past 3 weeks.  Now with elevated LFTs and BUN-may be from passive congestion and decreased output.  Pericardiocentesis risks and benefits explained to the patient and family.  He is willing to proceed.  Will send fluid for further analysis.

## 2013-07-01 NOTE — Progress Notes (Signed)
Nursing: Patient had Victoza due at 78. Patient & Patient's wife refused to have medication administered at 2000. They reported that " he takes it in the morning because that's when the doctor said it would be most effective". This RN took the patient's personal Victoza pen to main pharmacy to be checked in. Copy of this record placed in the patient's chart.

## 2013-07-01 NOTE — Progress Notes (Signed)
Pt transferred to Cath lab.

## 2013-07-01 NOTE — Progress Notes (Signed)
Chart reviewed. Overnight, started on cardizem gtt for atrial fibrillation with RVR. Converted to NSR after an hour or so.   [facility] PROGRESS NOTE  Joshua [name] MCN:[id number] DOB: 05-Jun-1940 DOA: 06/30/2013 PCP: Tammi Sou, MD  Assessment/Plan:  Principal Problem:   COPD with exacerbation: change solumedrol to 60 mg q12. Change albuterol to xopenex due to a fib, rvr. Add atrovent. Encouraged smoking cessation.    Active Problems:   Acute bronchitis: change to po abx. Antitussives prn    Nonspecific abnormal electrocardiogram (ECG) (EKG): MI ruled out. Transfer to tele. Await echo   Pleural effusion with elevated pro BNP: got single dose lasix. Await echo.  Will stop actos in case CHF  Atrial fibrillation with RVR: may be from above, but pt asymptomatic, so chronicity unknown. Continue heparin for now and await cardiology recs. Now in sinus. D/c cardizem gtt and add low dose oral diltiazem.    Type II or unspecified type diabetes mellitus without mention of complication, uncontrolled. CBGs better.  hgb a1c pending. Continue SSI. May need insulin at discharge.     HTN (hypertension), benign    Tobacco dependence    Hyperlipidemia  Code Status:  full Family Communication:  Wife at bedside Disposition Plan:  home  Consultants:  [place]  Procedures:     Antibiotics:  Levofloxacin 6/26  HPI/Subjective: Still wheezing. Coughing, no sputum. No CP or palpitations. No leg swelling.  Smokes 1PPD cigarettes and interested in quitting.  Objective: Filed Vitals:   07/01/13 0900  BP: 120/52  Pulse: 80  Temp:   Resp: 15    Intake/Output Summary (Last 24 hours) at 07/01/13 0944 Last data filed at 07/01/13 0803  Gross per 24 hour  Intake 353.73 ml  Output    900 ml  Net -546.27 ml   Filed Weights   06/30/13 1807 06/30/13 2225  Weight: 90.719 kg (200 lb) 89.8 kg (197 lb 15.6 oz)   Tele: NSR. Strips reviewed.  A fib with rate 120 last  night  Exam:   General:  Coughing. Alert and oriented  Cardiovascular: RRR without MGR  Respiratory: bilateral wheeze  Abdomen: S, NT, ND  Ext: no CCE  Basic Metabolic Panel:  Recent Labs Lab 06/30/13 1230 06/30/13 1825 07/01/13 0557  NA 132* 134* 138  K 4.8 4.2 4.4  CL 100 96 99  CO2 _0 GLUCOSE 440* 395* 196*  BUN 59* 61* 57*  CREATININE 1.3 1.10 0.98  CALCIUM 8.9 9.5 8.8   Liver Function Tests:  Recent Labs Lab 06/30/13 1230 06/30/13 1825  AST 35 27  ALT 72* 66*  ALKPHOS 140* 155*  BILITOT 0.5 0.3  PROT 6.3 7.0  ALBUMIN 2.8* 2.8*   No results found for this basename: LIPASE, AMYLASE,  in the last 168 hours No results found for this basename: AMMONIA,  in the last 168 hours CBC:  Recent Labs Lab 06/30/13 1230 06/30/13 1825 07/01/13 0019  WBC 9.7 9.7 9.3  NEUTROABS 7.6 7.1  --   HGB 12.3* 12.3* 12.3*  HCT 37.3* 37.7* 37.5*  MCV 85.0 85.9 85.6  PLT 254.0 242 230   Cardiac Enzymes:  Recent Labs Lab 06/30/13 1825 07/01/13 0019 07/01/13 0557  TROPONINI <0.30 <0.30 <0.30   BNP (last 3 results)  Recent Labs  06/30/13 1825  PROBNP 1443.0*   CBG:  Recent Labs Lab 06/30/13 2234 07/01/13 0400 07/01/13 0813  GLUCAP 323* 220* 193*    Recent Results (from the past 240 hour(s))  MRSA PCR SCREENING     Status: None   Collection Time    06/30/13 10:26 PM      Result Value Ref Range Status   MRSA by PCR NEGATIVE  NEGATIVE Final   Comment:            The GeneXpert MRSA Assay (FDA     approved for NASAL specimens     only), is one component of a     comprehensive MRSA colonization     surveillance program. It is not     intended to diagnose MRSA     infection nor to guide or     monitor treatment for     MRSA infections.     Studies: Dg Chest 2 View  06/30/2013   CLINICAL DATA:  cough and fatigue  EXAM: CHEST - 2 VIEW  COMPARISON:  07/07/2012  FINDINGS: Mild cardiomegaly. Small bilateral pleural effusions. Adjacent  consolidation or subsegmental atelectasis in the lung bases right greater than left. No overt interstitial edema. Atheromatous aorta. Surgical clips in the upper abdomen. Mild spurring in the thoracic spine.  IMPRESSION: Mild cardiomegaly and small pleural effusions, new since previous exam.   Electronically Signed   By: Arne Cleveland M.D.   On: 06/30/2013 13:13    Scheduled Meds: . albuterol  2.5 mg Nebulization QID  . aspirin EC  81 mg Oral Daily  . budesonide-formoterol  2 puff Inhalation BID  . buPROPion  150 mg Oral Daily  . chlorthalidone  25 mg Oral Daily  . docusate sodium  100 mg Oral BID  . glimepiride  4 mg Oral BID WC  . insulin aspart  0-20 Units Subcutaneous 6 times per day  . levofloxacin (LEVAQUIN) IV  750 mg Intravenous QHS  . metFORMIN  1,000 mg Oral BID WC  . methylPREDNISolone (SOLU-MEDROL) injection  40 mg Intravenous Q6H  . multivitamin with minerals  1 tablet Oral Daily  . pioglitazone  45 mg Oral Q breakfast  . ramipril  10 mg Oral BID  . simvastatin  20 mg Oral q1800  . sodium chloride  3 mL Intravenous Q12H  . sodium chloride  3 mL Intravenous Q12H   Continuous Infusions: . diltiazem (CARDIZEM) infusion 5 mg/hr (07/01/13 0102)  . heparin 1,300 Units/hr (07/01/13 0737)    Time spent: 35 minutes  [place] [facility] Pager [phone]. If 7PM-7AM, please contact night-coverage at [email or web address], password [facility] 07/01/2013, 9:44 AM  LOS: 1 day

## 2013-07-01 NOTE — Progress Notes (Signed)
Nursing: Patient's blood sugar 465. Dr. Tommi Rumps made aware. Orders received. See MAR.

## 2013-07-01 NOTE — Progress Notes (Signed)
Inpatient Diabetes Program Recommendations  AACE/ADA: New Consensus Statement on Inpatient Glycemic Control (2013)  Target Ranges:  Prepandial:   less than 140 mg/dL      Peak postprandial:   less than 180 mg/dL (1-2 hours)      Critically ill patients:  140 - 180 mg/dL   Reason for Assessment:   Diabetes history: Type 2 Outpatient Diabetes medications: Farxiga 5 mg, Amaryl 4 mg bid, Victoza 1.8 mg Subq, Glucophage 1000 bid, Actos 45 mg  Current orders for Inpatient glycemic control: Resistant correction tid, Amaryl 4 mg bid, Victoza 1.8 mg, Glucophage 1000 bid  Note:  Received first dose of correction Novolog last night around HS.  Home amaryl, victoza, and glucophage starting today.  Note A1C on 06/08/13 was 10.7.  A1C on 2.4.15 was 10.0.  Read office note by [name] which stated that patient was open to anything to improve glycemic control except insulin.  [name] was added at that visit and patient advised to move Victoza from HS to AM.  PCP also noted that patient feels a lot of his problems are dietary-- lacks motivation, not education.  Will encourage nursing staff to review basic diabetes self-care survival skills.  Will ask Diabetes Coordinator to see Monday regarding possible referral to [facility] as this might help with motivation-- plus any education deficits of which he may not be aware.  If CBG's are still above target by tomorrow, request MD consider adding the following:  Lantus 20 units  Meal coverage Novolog 4 to 6 units tid with meals provided he eats at least 50%   Thank you.  [name], RN, CNS, CDE Inpatient Diabetes Program, team pager [phone]

## 2013-07-01 NOTE — Progress Notes (Signed)
Smoking cessation  literature given to pt.

## 2013-07-01 NOTE — Progress Notes (Signed)
ANTICOAGULATION CONSULT NOTE - Follow Up Consult  Pharmacy Consult for Heparin  Indication: chest pain/ACS  Allergies  Allergen Reactions  . Demerol [Meperidine] Nausea Only  . Morphine And Related Other (See Comments)    Drenched with perspiration  . Starlix [Nateglinide] Other (See Comments)    gassy    Patient Measurements: Height: _0  (180.3 cm) Weight: 197 lb 15.6 oz (89.8 kg) IBW/kg (Calculated) : 75.3  Vital Signs: Temp: 98.7 F (37.1 C) (06/26 2321) Temp src: Oral (06/26 2321) BP: 91/73 mmHg (06/27 0515) Pulse Rate: 75 (06/27 0515)  Labs:  Recent Labs  06/30/13 1230 06/30/13 1825 07/01/13 0019 07/01/13 0557  HGB 12.3* 12.3* 12.3*  --   HCT 37.3* 37.7* 37.5*  --   PLT 254.0 242 230  --   APTT  --  36  --   --   LABPROT  --  13.9  --   --   INR  --  1.07  --   --   HEPARINUNFRC  --   --   --  <0.10*  CREATININE 1.3 1.10  --   --   TROPONINI  --  <0.30 <0.30 <0.30    Estimated Creatinine Clearance: 62.8 ml/min (by C-G formula based on Cr of 1.1).   Medications:  Heparin 1100 units/hr  Assessment: 73 y/o M on heparin for CP. First HL is <0.1. Other labs as above. No issues per RN.   Goal of Therapy:  Heparin level 0.3-0.7 units/ml Monitor platelets by anticoagulation protocol: Yes   Plan:  -Increase heparin drip to 1300 units/hr -1500 HL -Daily CBC/HL -Monitor for bleeding  Joshua [name], Joshua [name] 07/01/2013,7:00 AM

## 2013-07-01 NOTE — Progress Notes (Signed)
Transferred to [place] room 4 by wheelchair, stable, belongings with daughter , report given to RN.

## 2013-07-01 NOTE — Progress Notes (Signed)
Offered to be on a chair but refused. Explained the benefits of getting out of bed than to be on  bed but still  refused.

## 2013-07-01 NOTE — CV Procedure (Signed)
PROCEDURE:  Pericardiocentesis  INDICATIONS:  Large pericardial effusion with features of pericardial tamponade  The risks, benefits, and details of the procedure were explained to the patient.  The patient verbalized understanding and wanted to proceed.  Informed written consent was obtained.  PROCEDURE TECHNIQUE:  After Xylocaine anesthesia, access with needle and stylette was obtained to the pericardial space. The track was dilated. An 8.3 French straight pericardiocentesis catheter was advanced. 500 cc of fluid were removed.  The drain was sutured in place. It was attached to a passive drainage system with bag.   CONTRAST:  Total of 0 cc.  COMPLICATIONS:  None.    HEMODYNAMICS:  Pericardial pressure of 15 mmHg    Fluoroscopic DATA:   Heavy calcification is noted of the RCA as well the proximal left system.    IMPRESSIONS:  1. Successful pericardiocentesis with 500 cc fluid removal. Fluid is serosanguinous.  RECOMMENDATION:  Await results of lab analysis of fluid.  He'll be watched in the ICU. Further management based on results of the fluid analysis.

## 2013-07-02 ENCOUNTER — Encounter (HOSPITAL_COMMUNITY): Payer: Self-pay

## 2013-07-02 ENCOUNTER — Inpatient Hospital Stay (HOSPITAL_COMMUNITY): Payer: Medicare HMO

## 2013-07-02 DIAGNOSIS — I480 Paroxysmal atrial fibrillation: Secondary | ICD-10-CM

## 2013-07-02 DIAGNOSIS — J9620 Acute and chronic respiratory failure, unspecified whether with hypoxia or hypercapnia: Secondary | ICD-10-CM | POA: Diagnosis present

## 2013-07-02 LAB — CBC
HCT: 35.4 % — ABNORMAL LOW (ref 39.0–52.0)
Hemoglobin: 11.5 g/dL — ABNORMAL LOW (ref 13.0–17.0)
MCH: 28 pg (ref 26.0–34.0)
MCHC: 32.5 g/dL (ref 30.0–36.0)
MCV: 86.1 fL (ref 78.0–100.0)
Platelets: 217 10*3/uL (ref 150–400)
RBC: 4.11 MIL/uL — AB (ref 4.22–5.81)
RDW: 14.1 % (ref 11.5–15.5)
WBC: 9.9 10*3/uL (ref 4.0–10.5)

## 2013-07-02 LAB — BASIC METABOLIC PANEL
BUN: 36 mg/dL — ABNORMAL HIGH (ref 6–23)
CO2: 25 meq/L (ref 19–32)
Calcium: 9.3 mg/dL (ref 8.4–10.5)
Chloride: 95 mEq/L — ABNORMAL LOW (ref 96–112)
Creatinine, Ser: 0.78 mg/dL (ref 0.50–1.35)
GFR calc non Af Amer: 87 mL/min — ABNORMAL LOW (ref >90)
Glucose, Bld: 360 mg/dL — ABNORMAL HIGH (ref 70–99)
POTASSIUM: 4.6 meq/L (ref 3.7–5.3)
Sodium: 134 mEq/L — ABNORMAL LOW (ref 137–147)

## 2013-07-02 LAB — GLUCOSE, CAPILLARY
GLUCOSE-CAPILLARY: 385 mg/dL — AB (ref 70–99)
GLUCOSE-CAPILLARY: 274 mg/dL — AB (ref 70–99)
Glucose-Capillary: 354 mg/dL — ABNORMAL HIGH (ref 70–99)
Glucose-Capillary: 129 mg/dL — ABNORMAL HIGH (ref 70–99)

## 2013-07-02 MED ORDER — INSULIN GLARGINE 100 UNIT/ML ~~LOC~~ SOLN: 20.0000 [IU] | Freq: Every day | SUBCUTANEOUS | Status: DC

## 2013-07-02 MED ORDER — INSULIN GLARGINE 100 UNIT/ML ~~LOC~~ SOLN
15.0000 [IU] | Freq: Every day | SUBCUTANEOUS | Status: DC
  Administered 2013-07-02 – 2013-07-03 (×2): 15 [IU] via SUBCUTANEOUS
  Filled 2013-07-02 (×2): qty 0.15

## 2013-07-02 MED ORDER — PREDNISONE 50 MG PO TABS
60.0000 mg | ORAL_TABLET | Freq: Every day | ORAL | Status: DC
  Administered 2013-07-03 – 2013-07-04 (×2): 60 mg via ORAL
  Filled 2013-07-02 (×3): qty 1

## 2013-07-02 MED ORDER — PIOGLITAZONE HCL 45 MG PO TABS
45.0000 mg | ORAL_TABLET | Freq: Every day | ORAL | Status: DC
  Administered 2013-07-03 – 2013-07-04 (×2): 45 mg via ORAL
  Filled 2013-07-02 (×3): qty 1

## 2013-07-02 MED ORDER — DAPAGLIFLOZIN PROPANEDIOL 5 MG PO TABS: 5.0000 mg | ORAL_TABLET | Freq: Every day | ORAL | Status: DC

## 2013-07-02 MED ORDER — IOHEXOL 300 MG/ML  SOLN
100.0000 mL | Freq: Once | INTRAMUSCULAR | Status: AC | PRN
  Administered 2013-07-02: 100 mL via INTRAVENOUS

## 2013-07-02 MED ORDER — IOHEXOL 300 MG/ML  SOLN
25.0000 mL | INTRAMUSCULAR | Status: AC
  Administered 2013-07-02 (×2): 25 mL via ORAL

## 2013-07-02 NOTE — Progress Notes (Signed)
Utilization Review Completed.[place], Joshua [name]  

## 2013-07-02 NOTE — Progress Notes (Signed)
Chart reviewed. Overnight, started on cardizem gtt for atrial fibrillation with RVR. Converted to NSR after an hour or so.   [facility] PROGRESS NOTE  Joshua [name] RKY:[id number] DOB: 10/22/1940 DOA: 06/30/2013 PCP: Tammi Sou, MD  HPI/Subjective: Mild dry cough today- no dyspnea at rest or with ambulation today  Assessment/Plan:  Principal Problem: Pericardial effusion with tamponade physiology - s/p pericardiocentesis yesterday- fluid appears to be blood tinged and infectious- f/u on cultures - management per cardiology    COPD with exacerbation:  on solumedrol to 60 mg q12.- wean to prednisone today - Changed albuterol to xopenex due to a fib, rvr. Added atrovent.  -Encouraged smoking cessation.      Acute bronchitis:  - On Levaquin - started 6/26- last dose with be 6/30 - Antitussives prn  Atrial fibrillation with RVR: may be from above, but pt asymptomatic, so chronicity unknown -  await cardiology recs on anticoagulation-  - Now in sinus- not on rate controlling agent for COPD exacerbation- can start at B selective [employer] if needed - management per Cardiology    Type II or unspecified type diabetes mellitus without mention of complication, uncontrolled.  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 - on Metformin, Victoza and Amaryl- Actos on hold- will resume - sugars severely uncontrolled- hyperglycemia partially related to steroids- add Lantus while he is here- cont siding scale - resume Farxiga    HTN (hypertension), benign - currently on Ramipril and chlorthalidone    Tobacco dependence    Hyperlipidemia - cont Statin  Code Status:  full Family Communication:  Wife at bedside Disposition Plan:  home  Consultants:  [facility]  Procedures:     Antibiotics:  Levofloxacin  6/26   Objective: Filed Vitals:   07/02/13 0900  BP: 86/40  Pulse: 89  Temp:   Resp: 24    Intake/Output Summary (Last 24 hours) at 07/02/13 0916 Last data filed at 07/02/13 0900  Gross per 24 hour  Intake   1092 ml  Output   1900 ml  Net   -808 ml   Filed Weights   06/30/13 1807 06/30/13 2225  Weight: 90.719 kg (200 lb) 89.8 kg (197 lb 15.6 oz)   Tele: NSR. Strips reviewed.  A fib with rate 120 last night  Exam:   General:   Alert and oriented- no distress  Cardiovascular: RRR without MGR- chest tube in place  Respiratory: CTA b/l   Abdomen: S, NT, ND  Ext: no CCE  Basic Metabolic Panel:  Recent Labs Lab 06/30/13 1230 06/30/13 1825 07/01/13 0557  NA 132* 134* 138  K 4.8 4.2 4.4  CL 100 96 99  CO2 _0 GLUCOSE 440* 395* 196*  BUN 59* 61* 57*  CREATININE 1.3 1.10 0.98  CALCIUM 8.9 9.5 8.8   Liver Function Tests:  Recent Labs Lab 06/30/13 1230 06/30/13 1825  AST 35 27  ALT 72* 66*  ALKPHOS 140* 155*  BILITOT 0.5 0.3  PROT 6.3 7.0  ALBUMIN 2.8* 2.8*   No results found for this basename: LIPASE, AMYLASE,  in the last 168 hours No results found for this basename: AMMONIA,  in the last 168 hours CBC:  Recent Labs Lab 06/30/13 1230 06/30/13 1825 07/01/13 0019 07/02/13 0245  WBC 9.7 9.7 9.3 9.9  NEUTROABS 7.6 7.1  --   --   HGB 12.3* 12.3* 12.3* 11.5*  HCT 37.3* 37.7* 37.5* 35.4*  MCV 85.0 85.9 85.6 86.1  PLT 254.0 242 230 217   Cardiac Enzymes:  Recent Labs Lab 06/30/13 1825 07/01/13 0019 07/01/13 0557 07/01/13 1155  TROPONINI <0.30 <0.30 <0.30 <0.30   BNP (last 3 results)  Recent Labs  06/30/13 1825  PROBNP 1443.0*   CBG:  Recent Labs Lab 07/01/13 0813 07/01/13 1205 07/01/13 1746 07/01/13 2138 07/02/13 0800  GLUCAP 193* 323* 293* 465* 385*    Recent Results (from the past 240 hour(s))  MRSA PCR SCREENING     Status: None   Collection Time    06/30/13 10:26 PM      Result Value Ref Range Status   MRSA  by PCR NEGATIVE  NEGATIVE Final   Comment:            The GeneXpert MRSA Assay (FDA     approved for NASAL specimens     only), is one component of a     comprehensive MRSA colonization     surveillance program. It is not     intended to diagnose MRSA     infection nor to guide or     monitor treatment for     MRSA infections.     Studies: Dg Chest 2 View  06/30/2013   CLINICAL DATA:  cough and fatigue  EXAM: CHEST - 2 VIEW  COMPARISON:  07/07/2012  FINDINGS: Mild cardiomegaly. Small bilateral pleural effusions. Adjacent consolidation or subsegmental atelectasis in the lung bases right greater than left. No overt interstitial edema. Atheromatous aorta. Surgical clips in the upper abdomen. Mild spurring in the thoracic spine.  IMPRESSION: Mild cardiomegaly and small pleural effusions, new since previous exam.   Electronically Signed   By: Arne Cleveland M.D.   On: 06/30/2013 13:13    Scheduled Meds: . aspirin EC  81 mg Oral Daily  . atorvastatin  10 mg Oral q1800  . budesonide-formoterol  2 puff Inhalation BID  . buPROPion  150 mg Oral Daily  . chlorthalidone  25 mg Oral Daily  . docusate sodium  100 mg Oral BID  . glimepiride  4 mg Oral BID WC  . heparin  5,000 Units Subcutaneous 3 times per day  . insulin aspart  0-20 Units Subcutaneous TID WC  . ipratropium  0.5 mg Nebulization QID  . levalbuterol  0.63 mg Nebulization QID  . levofloxacin  500 mg Oral Daily  . Liraglutide  1.8 mg Subcutaneous Daily  . metFORMIN  1,000 mg Oral BID WC  . methylPREDNISolone (SOLU-MEDROL) injection  60 mg Intravenous Q12H  . multivitamin with minerals  1 tablet Oral Daily  . ramipril  10 mg Oral BID  . sodium chloride  3 mL Intravenous Q12H   Continuous Infusions:    Time spent: 45 minutes  [place] [facility] [email or web address] 07/02/2013, 9:16 AM  LOS: 2 days

## 2013-07-02 NOTE — Progress Notes (Addendum)
SUBJECTIVE:   73 y/o smoker admitted with dyspnea and probable COPD flare  Found to have large pericardial effusion and underwent pericardiocentesis of 500cc bloody fluid yesterday. Initial fluid evaluation concerning for malignancy with LDH 550. WBC 1676 with 76% PMNs Cytology pending.   Had AF yesterday but now back in NSR with PACs.   Breathing better. No fevers or chills. Pigtail drained 300cc fluid overnight    Intake/Output Summary (Last 24 hours) at 07/02/13 0934 Last data filed at 07/02/13 0900  Gross per 24 hour  Intake   1092 ml  Output   2300 ml  Net  -1208 ml    Current Facility-Administered Medications  Medication Dose Route Frequency Provider Last Rate Last Dose  . acetaminophen (TYLENOL) tablet 650 mg  650 mg Oral Q4H PRN Jettie Booze, MD      . aspirin EC tablet 81 mg  81 mg Oral Daily Orvan Falconer, MD   81 mg at 07/02/13 0906  . atorvastatin (LIPITOR) tablet 10 mg  10 mg Oral q1800 Delfina Redwood, MD   10 mg at 07/01/13 2214  . budesonide-formoterol (SYMBICORT) 160-4.5 MCG/ACT inhaler 2 puff  2 puff Inhalation BID Orvan Falconer, MD   2 puff at 07/02/13 [phone]  . buPROPion (WELLBUTRIN XL) 24 hr tablet 150 mg  150 mg Oral Daily Orvan Falconer, MD   150 mg at 07/02/13 0906  . chlorthalidone (HYGROTON) tablet 25 mg  25 mg Oral Daily Orvan Falconer, MD   25 mg at 07/02/13 0906  . docusate sodium (COLACE) capsule 100 mg  100 mg Oral BID Orvan Falconer, MD   100 mg at 07/02/13 4944  . glimepiride (AMARYL) tablet 4 mg  4 mg Oral BID WC Orvan Falconer, MD   4 mg at 07/02/13 0906  . guaiFENesin-dextromethorphan (ROBITUSSIN DM) 100-10 MG/5ML syrup 5 mL  5 mL Oral Q4H PRN Delfina Redwood, MD   5 mL at 07/02/13 0458  . heparin injection 5,000 Units  5,000 Units Subcutaneous 3 times per day Jettie Booze, MD   5,000 Units at 07/02/13 0458  . insulin aspart (novoLOG) injection 0-20 Units  0-20 Units Subcutaneous TID WC Delfina Redwood, MD   20 Units at 07/02/13 [phone]  . ipratropium  (ATROVENT) nebulizer solution 0.5 mg  0.5 mg Nebulization QID Delfina Redwood, MD   0.5 mg at 07/02/13 0814  . levalbuterol (XOPENEX) nebulizer solution 0.63 mg  0.63 mg Nebulization QID Delfina Redwood, MD   0.63 mg at 07/02/13 0814  . levofloxacin (LEVAQUIN) tablet 500 mg  500 mg Oral Daily Delfina Redwood, MD   500 mg at 07/02/13 0906  . Liraglutide SOPN 1.8 mg  1.8 mg Subcutaneous Daily Delfina Redwood, MD   1.8 mg at 07/02/13 0800  . metFORMIN (GLUCOPHAGE) tablet 1,000 mg  1,000 mg Oral BID WC Orvan Falconer, MD   1,000 mg at 07/02/13 0906  . multivitamin with minerals tablet 1 tablet  1 tablet Oral Daily Orvan Falconer, MD   1 tablet at 07/02/13 [phone]  . ondansetron (ZOFRAN) tablet 4 mg  4 mg Oral Q6H PRN Orvan Falconer, MD       Or  . ondansetron [facility]) injection 4 mg  4 mg Intravenous Q6H PRN Orvan Falconer, MD      . ondansetron [facility]) injection 4 mg  4 mg Intravenous Q6H PRN Jettie Booze, MD      . Derrill Memo ON 07/03/2013] predniSONE (DELTASONE)  tablet 60 mg  60 mg Oral Q breakfast Debbe Odea, MD      . ramipril (ALTACE) capsule 10 mg  10 mg Oral BID Orvan Falconer, MD   10 mg at 07/01/13 2214  . sodium chloride 0.9 % injection 3 mL  3 mL Intravenous Q12H Orvan Falconer, MD   3 mL at 07/02/13 0906    Filed Vitals:   07/02/13 0800 07/02/13 0815 07/02/13 0900 07/02/13 0924  BP: 131/63  86/40 107/82  Pulse: 71  89 80  Temp: 98.5 F (36.9 C)     TempSrc: Oral     Resp: _0 Height:      Weight:      SpO2: 98% 96% 96% 98%    PHYSICAL EXAM General: NAD Neck: JVP 10 no thyromegaly.  Lungs: Diminished , no rales or wheezes. CV: Nondisplaced PMI.  Irregular rate and rhythm, normal S1/S2, no S3/S4, no murmur.  No pretibial edema.  No carotid bruit.  Normal pedal pulses. +pericardial drain Abdomen: Soft, nontender, no hepatosplenomegaly, no distention.  Neurologic: Alert and oriented x 3.  Psych: Normal affect. Extremities: No clubbing or cyanosis.   TELEMETRY: Reviewed telemetry pt in  sinus rhythm with PAC's and PVC's,   LABS: Basic Metabolic Panel:  Recent Labs  06/30/13 1825 07/01/13 0557  NA 134* 138  K 4.2 4.4  CL 96 99  CO2 20 20  GLUCOSE 395* 196*  BUN 61* 57*  CREATININE 1.10 0.98  CALCIUM 9.5 8.8   Liver Function Tests:  Recent Labs  06/30/13 1230 06/30/13 1825  AST 35 27  ALT 72* 66*  ALKPHOS 140* 155*  BILITOT 0.5 0.3  PROT 6.3 7.0  ALBUMIN 2.8* 2.8*   No results found for this basename: LIPASE, AMYLASE,  in the last 72 hours CBC:  Recent Labs  06/30/13 1230 06/30/13 1825 07/01/13 0019 07/02/13 0245  WBC 9.7 9.7 9.3 9.9  NEUTROABS 7.6 7.1  --   --   HGB 12.3* 12.3* 12.3* 11.5*  HCT 37.3* 37.7* 37.5* 35.4*  MCV 85.0 85.9 85.6 86.1  PLT 254.0 242 230 217   Cardiac Enzymes:  Recent Labs  07/01/13 0019 07/01/13 0557 07/01/13 1155  TROPONINI <0.30 <0.30 <0.30   BNP: No components found with this basename: POCBNP,  D-Dimer: No results found for this basename: DDIMER,  in the last 72 hours Hemoglobin A1C:  Recent Labs  07/01/13 0019  HGBA1C 10.3*   Fasting Lipid Panel: No results found for this basename: CHOL, HDL, LDLCALC, TRIG, CHOLHDL, LDLDIRECT,  in the last 72 hours Thyroid Function Tests:  Recent Labs  07/01/13 0019  TSH 0.918   Anemia Panel:  Recent Labs  06/30/13 1230  FERRITIN 200.6  IRON 21*    RADIOLOGY: Dg Chest 2 View  06/30/2013   CLINICAL DATA:  cough and fatigue  EXAM: CHEST - 2 VIEW  COMPARISON:  07/07/2012  FINDINGS: Mild cardiomegaly. Small bilateral pleural effusions. Adjacent consolidation or subsegmental atelectasis in the lung bases right greater than left. No overt interstitial edema. Atheromatous aorta. Surgical clips in the upper abdomen. Mild spurring in the thoracic spine.  IMPRESSION: Mild cardiomegaly and small pleural effusions, new since previous exam.   Electronically Signed   By: Arne Cleveland M.D.   On: 06/30/2013 13:13    ASSESSMENT AND PLAN: 1. Pericardial effusion  with tamponade - exudative    - s/p percardiocentesis 6/27 - suspect malignant 2. NSVT 3. Acute respiratory failure due to COPD flare  and #1 4. AECOPD with ongoing tobacco use 5. DM2 6. PAF -> now back in NSR  Improved with drainage of pericardial effusion. Will leave pigtail in today. Suspect this is malignant but with high PMS may be infectious/inflammatory but no infectious sx currently- await cytology. Will get CT C/A/P to further evaluate.   AF resolved. Would not anticoagulate due to bloody pericardial effusion. Can continue heaprin DVT prophylaxis for now - watch closely for increased bleeding in pericardial fluid.   Treatment of AECOPD per primary team.   Benay Spice 9:41 AM

## 2013-07-02 NOTE — Progress Notes (Addendum)
Chart reviewed. Overnight, started on cardizem gtt for atrial fibrillation with RVR. Converted to NSR after an hour or so.   [facility] PROGRESS NOTE  VRAJ DENARDO ERD:[id number] DOB: 06-10-1940 DOA: 06/30/2013 PCP: Tammi Sou, MD  HPI/Subjective: Mild dry cough today- no dyspnea at rest or with ambulation today  Assessment/Plan:  Principal Problem: Pericardial effusion with tamponade physiology - s/p pericardiocentesis yesterday- fluid appears to be blood tinged and infectious- f/u on cultures - management per cardiology    COPD with exacerbation- Acute resp failure on solumedrol to 60 mg q12.- wean to prednisone today - Changed albuterol to xopenex due to a fib, rvr. Added atrovent.  -Encouraged smoking cessation.      Acute bronchitis:  - On Levaquin - started 6/26- last dose with be 6/30 - Antitussives prn  Atrial fibrillation with RVR: may be from above, but pt asymptomatic, so chronicity unknown -  await cardiology recs on anticoagulation-  - Now in sinus- not on rate controlling agent for COPD exacerbation- can start at [facility] selective [facility] [employer] if needed - management per Cardiology    Type II or unspecified type diabetes mellitus without mention of complication, uncontrolled.  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 - on Metformin, Victoza and Amaryl- Actos on hold- will resume - sugars severely uncontrolled- hyperglycemia partially related to steroids- add Lantus while he is here- cont siding scale - prescribed Farxiga- has not started yet- our pharmacy does not have it- will hold off on starting it    HTN (hypertension), benign - currently on Ramipril and chlorthalidone    Tobacco dependence    Hyperlipidemia - cont Statin  Code Status:  full Family Communication:  Wife at bedside Disposition Plan:   home  Consultants:  Ryan  Procedures:     Antibiotics:  Levofloxacin 6/26   Objective: Filed Vitals:   07/02/13 0924  BP: 107/82  Pulse: 80  Temp:   Resp: 24    Intake/Output Summary (Last 24 hours) at 07/02/13 0943 Last data filed at 07/02/13 0900  Gross per 24 hour  Intake   1092 ml  Output   2300 ml  Net  -1208 ml   Filed Weights   06/30/13 1807 06/30/13 2225  Weight: 90.719 kg (200 lb) 89.8 kg (197 lb 15.6 oz)   Tele: NSR. Strips reviewed.  A fib with rate 120 last night  Exam:   General:   Alert and oriented- no distress  Cardiovascular: RRR without MGR- chest tube in place  Respiratory: CTA b/l   Abdomen: S, NT, ND  Ext: no CCE  Basic Metabolic Panel:  Recent Labs Lab 06/30/13 1230 06/30/13 1825 07/01/13 0557  NA 132* 134* 138  K 4.8 4.2 4.4  CL 100 96 99  CO2 _0 GLUCOSE 440* 395* 196*  BUN 59* 61* 57*  CREATININE 1.3 1.10 0.98  CALCIUM 8.9 9.5 8.8   Liver Function Tests:  Recent Labs Lab 06/30/13 1230 06/30/13 1825  AST 35 27  ALT 72* 66*  ALKPHOS 140* 155*  BILITOT 0.5 0.3  PROT 6.3 7.0  ALBUMIN 2.8* 2.8*   No results found for this basename: LIPASE, AMYLASE,  in the last 168 hours No results found for this basename: AMMONIA,  in the last 168 hours CBC:  Recent  Labs Lab 06/30/13 1230 06/30/13 1825 07/01/13 0019 07/02/13 0245  WBC 9.7 9.7 9.3 9.9  NEUTROABS 7.6 7.1  --   --   HGB 12.3* 12.3* 12.3* 11.5*  HCT 37.3* 37.7* 37.5* 35.4*  MCV 85.0 85.9 85.6 86.1  PLT 254.0 242 230 217   Cardiac Enzymes:  Recent Labs Lab 06/30/13 1825 07/01/13 0019 07/01/13 0557 07/01/13 1155  TROPONINI <0.30 <0.30 <0.30 <0.30   BNP (last 3 results)  Recent Labs  06/30/13 1825  PROBNP 1443.0*   CBG:  Recent Labs Lab 07/01/13 0813 07/01/13 1205 07/01/13 1746 07/01/13 2138 07/02/13 0800  GLUCAP 193* 323* 293* 465* 385*    Recent Results (from the past 240 hour(s))  MRSA PCR SCREENING      Status: None   Collection Time    06/30/13 10:26 PM      Result Value Ref Range Status   MRSA by PCR NEGATIVE  NEGATIVE Final   Comment:            The GeneXpert MRSA Assay (FDA     approved for NASAL specimens     only), is one component of a     comprehensive MRSA colonization     surveillance program. It is not     intended to diagnose MRSA     infection nor to guide or     monitor treatment for     MRSA infections.     Studies: Dg Chest 2 View  06/30/2013   CLINICAL DATA:  cough and fatigue  EXAM: CHEST - 2 VIEW  COMPARISON:  07/07/2012  FINDINGS: Mild cardiomegaly. Small bilateral pleural effusions. Adjacent consolidation or subsegmental atelectasis in the lung bases right greater than left. No overt interstitial edema. Atheromatous aorta. Surgical clips in the upper abdomen. Mild spurring in the thoracic spine.  IMPRESSION: Mild cardiomegaly and small pleural effusions, new since previous exam.   Electronically Signed   By: Arne Cleveland M.D.   On: 06/30/2013 13:13    Scheduled Meds: . aspirin EC  81 mg Oral Daily  . atorvastatin  10 mg Oral q1800  . budesonide-formoterol  2 puff Inhalation BID  . buPROPion  150 mg Oral Daily  . chlorthalidone  25 mg Oral Daily  . Dapagliflozin Propanediol  5 mg Oral Daily  . docusate sodium  100 mg Oral BID  . glimepiride  4 mg Oral BID WC  . heparin  5,000 Units Subcutaneous 3 times per day  . insulin aspart  0-20 Units Subcutaneous TID WC  . insulin glargine  15 Units Subcutaneous Daily  . ipratropium  0.5 mg Nebulization QID  . levalbuterol  0.63 mg Nebulization QID  . levofloxacin  500 mg Oral Daily  . Liraglutide  1.8 mg Subcutaneous Daily  . metFORMIN  1,000 mg Oral BID WC  . multivitamin with minerals  1 tablet Oral Daily  . [START ON 07/03/2013] pioglitazone  45 mg Oral Q breakfast  . [START ON 07/03/2013] predniSONE  60 mg Oral Q breakfast  . ramipril  10 mg Oral BID  . sodium chloride  3 mL Intravenous Q12H   Continuous  Infusions:    Time spent: 45 minutes  [facility] [email or web address] 07/02/2013, 9:43 AM  LOS: 2 days

## 2013-07-02 NOTE — Discharge Instructions (Signed)
Metformin and X-ray Contrast Studies For some X-ray exams, a contrast dye is used. Contrast dye is a type of medicine used to make the X-ray image clearer. The contrast dye is given to the patient through a vein (intravenously). If you need to have this type of X-ray exam and you take a medication called metformin, your caregiver may have you stop taking metformin before the exam.  LACTIC ACIDOSIS In rare cases, a serious medical condition called lactic acidosis can develop in people who take metformin and receive contrast dye. The following conditions can increase the risk of this complication:   Kidney failure.  Liver problems.  Certain types of heart problems such as:  Heart failure.  Heart attack.  Heart infection.  Heart valve problems.  Alcohol abuse. If left untreated, lactic acidosis can lead to coma.  SYMPTOMS OF LACTIC ACIDOSIS Symptoms of lactic acidosis can include:  Rapid breathing (hyperventilation).  Neurologic symptoms such as:  Headaches.  Confusion.  Dizziness.  Excessive sweating.  Feeling sick to your stomach (nauseous) or throwing up (vomiting). AFTER THE X-RAY EXAM  Stay well-hydrated. Drink fluids as instructed by your caregiver.  If you have a risk of developing lactic acidosis, blood tests may be done to make sure your kidney function is okay.  Metformin is usually stopped for 48 hours after the X-ray exam. Ask your caregiver when you can start taking metformin again. SEEK MEDICAL CARE IF:   You have shortness of breath or difficulty breathing.  You develop a headache that does not go away.  You have nausea or vomiting.  You urinate more than normal.  You develop a skin rash and have:  Redness.  Swelling.  Itching. Document Released: 12/10/2008 Document Revised: 03/16/2011 Document Reviewed: 12/10/2008 [facility] Patient Information 2015 [place], [place]. This information is not intended to replace advice given to you by your health  care provider. Make sure you discuss any questions you have with your health care provider.

## 2013-07-03 DIAGNOSIS — F17200 Nicotine dependence, unspecified, uncomplicated: Secondary | ICD-10-CM

## 2013-07-03 DIAGNOSIS — J441 Chronic obstructive pulmonary disease with (acute) exacerbation: Secondary | ICD-10-CM

## 2013-07-03 DIAGNOSIS — R0602 Shortness of breath: Secondary | ICD-10-CM

## 2013-07-03 DIAGNOSIS — I3139 Other pericardial effusion (noninflammatory): Secondary | ICD-10-CM

## 2013-07-03 DIAGNOSIS — I314 Cardiac tamponade: Secondary | ICD-10-CM

## 2013-07-03 DIAGNOSIS — I313 Pericardial effusion (noninflammatory): Secondary | ICD-10-CM

## 2013-07-03 DIAGNOSIS — I319 Disease of pericardium, unspecified: Secondary | ICD-10-CM

## 2013-07-03 HISTORY — DX: Pericardial effusion (noninflammatory): I31.3

## 2013-07-03 HISTORY — DX: Other pericardial effusion (noninflammatory): I31.39

## 2013-07-03 LAB — GLUCOSE, CAPILLARY
GLUCOSE-CAPILLARY: 302 mg/dL — AB (ref 70–99)
GLUCOSE-CAPILLARY: 282 mg/dL — AB (ref 70–99)
Glucose-Capillary: 225 mg/dL — ABNORMAL HIGH (ref 70–99)
Glucose-Capillary: 274 mg/dL — ABNORMAL HIGH (ref 70–99)

## 2013-07-03 LAB — CBC
HCT: 39.1 % (ref 39.0–52.0)
Hemoglobin: 12.6 g/dL — ABNORMAL LOW (ref 13.0–17.0)
MCH: 28.2 pg (ref 26.0–34.0)
MCHC: 32.2 g/dL (ref 30.0–36.0)
MCV: 87.5 fL (ref 78.0–100.0)
Platelets: 278 10*3/uL (ref 150–400)
RBC: 4.47 MIL/uL (ref 4.22–5.81)
RDW: 14.3 % (ref 11.5–15.5)
WBC: 10 10*3/uL (ref 4.0–10.5)

## 2013-07-03 LAB — PATHOLOGIST SMEAR REVIEW

## 2013-07-03 MED ORDER — AMLODIPINE BESYLATE 5 MG PO TABS
5.0000 mg | ORAL_TABLET | Freq: Every day | ORAL | Status: DC
  Administered 2013-07-03 – 2013-07-04 (×2): 5 mg via ORAL
  Filled 2013-07-03 (×2): qty 1

## 2013-07-03 MED ORDER — HYDROMORPHONE HCL PF 1 MG/ML IJ SOLN
INTRAMUSCULAR | Status: AC
  Filled 2013-07-03: qty 1

## 2013-07-03 MED ORDER — HYDROMORPHONE HCL PF 1 MG/ML IJ SOLN
1.0000 mg | Freq: Once | INTRAMUSCULAR | Status: AC
  Administered 2013-07-03: 1 mg via INTRAVENOUS

## 2013-07-03 MED ORDER — INSULIN GLARGINE 100 UNIT/ML ~~LOC~~ SOLN
22.0000 [IU] | Freq: Every day | SUBCUTANEOUS | Status: DC
  Administered 2013-07-04: 22 [IU] via SUBCUTANEOUS
  Filled 2013-07-03: qty 0.22

## 2013-07-03 MED ORDER — DM-GUAIFENESIN ER 30-600 MG PO TB12
1.0000 | ORAL_TABLET | Freq: Two times a day (BID) | ORAL | Status: DC
  Administered 2013-07-03 – 2013-07-04 (×2): 1 via ORAL
  Filled 2013-07-03 (×3): qty 1

## 2013-07-03 NOTE — Progress Notes (Signed)
[facility] TEAM 1 - Stepdown/ICU TEAM Progress Note  Joshua [name] XLK:[id number] DOB: 1940-09-26 DOA: 06/30/2013 PCP: Tammi Sou, MD  Admit HPI / Brief Narrative: Joshua [name] is an 73 y.o. WM PMHx f tobacco abuse, COPD, HTN, DM Type 2, and hyperlipidemia, presents to his PCP with complain of weakness for the past 3 weeks. His main complaint was that of weakness. CXR was done showing ?small bilateral effusion, along with CM. His EKG showed ? ST elevation. He had no chest pain but did complain of bilateral shoulder pain, non exertional. He was sent to [facility], where cardiology was consulted for ? STEMI. It was not felt that his EKG represented STEMI, and he was transferred here for cardiology to see him. He was started on IV Heparin. Upon arrival, he was asymptomatic. He did have a cough for the past 3 weeks, and audible wheezing, but no ill contact, distant travel, fever or chills. His troponin was negative, BS was 300;'s and Cr 1.1. His liver fx was only modestly elevated on statin. His one stress factor was that his children are getting a divorce. Hospitalist was asked to admit him for further work up. I saw him with Dr Radford Pax in his room in the presence of his family.   HPI/Subjective: 6/29 patient A./O. x4, NAD states does not use home O2. Negative CP/SOB  Assessment/Plan: Pericardial effusion with tamponade physiology  - s/p pericardiocentesis yesterday- fluid appears to be blood tinged and infectious - Pericardial fluid cultures  NGTD - management per cardiology   COPD with exacerbation- Acute resp failure  -Prednisone 60 mg  Daily   - Continue xopenex nebulizer  QID  -Continued Symbicort 160-4.5 mcg BID.  -Encouraged smoking cessation. (Patient with extensive smoking history 3-4 PPD x40 years)  Acute bronchitis:  - On Levaquin - started 6/26- last dose with be 6/30  - Robitussin-DM  BID -Flutter valve  q 4hr while awake     Atrial fibrillation with RVR:  -Currently rate  controlled, in NSR continue to monitor   Type II or unspecified type diabetes mellitus without mention of complication, uncontrolled.  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 We'll subjective patient again and informed him that uncontrolled diabetes is also grounds or loss of his CDL license  - on Metformin, Victoza and Amaryl- Actos on hold- will resume  - sugars severely uncontrolled, increase Lantus to 22 units daily -Continue resistant SSI  - prescribed Farxiga- has not started yet- our pharmacy does not have it- will hold off on starting it   HTN (hypertension), benign  - Controlled, continue Ramipril 10 mg daily  -Continue Clorthalidone 25 mg daily  Tobacco dependence  -Patient stated has stopped cold [place], in the a.m. will check to see if having any cravings, may be a good candidate for Wellbutrin Hyperlipidemia  - Continue Lipitor 10 mg  -Obtain lipid panel     Code Status: FULL Family Communication: no family present at time of exam Disposition Plan: Per cardiology    Consultants:\ Dr.VARANASI,JAYADEEP (cardiology)    Procedure/Significant Events: 6/27 Echocardiogram; - LVEF= 55%- 60%. - Mitral valve: mild regurgitation. - Pericardium, extracardiac: A large pericardial effusion was identified circumferential to the heart. Mild right atrial chamber collapse. There was mild right ventricular chamber collapse. Features were consistent with tamponade  physiology. 6/27 pericardial fluid NGTD 6/28 CT abdomen pelvis and chest with contrast - No evidence of malignancy in the chest or abdomen.  - Pericardial thickening and trace remaining effusion. No discrete pericardial nodule.  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  -  Bilateral pulmonary artery calcifications, emote pulmonary embolism?  -.5 cm ground-glass nodule at the right apex likely infectious vs or inflammatory.     Culture 6/27 pericardial fluid NGTD  Antibiotics: Levofloxacin 6/27>>  DVT prophylaxis: Subcutaneous heparin   Devices NA  LINES / TUBES:  6/26 ga right antecubital    Continuous Infusions:   Objective: VITAL SIGNS: Temp: 98.3 F (36.8 C) (06/29 1116) Temp src: Oral (06/29 1116) BP: 122/62 mmHg (06/29 1116) Pulse Rate: 88 (06/29 1116) SPO2; 90% on 2 L of O2 FIO2:   Intake/Output Summary (Last 24 hours) at 07/03/13 1128 Last data filed at 07/03/13 0943  Gross per 24 hour  Intake    720 ml  Output   4230 ml  Net  -3510 ml     Exam: General: A./O. x4, NAD, No acute respiratory distress Lungs: Diffuse expiratory wheezing negative crackles Cardiovascular: Regular rate and rhythm without murmur gallop or rub normal S1 and S2 Abdomen: Nontender, nondistended, soft, bowel sounds positive, no rebound, no ascites, no appreciable mass Extremities: No significant cyanosis, clubbing, or edema bilateral lower extremities  Data Reviewed: Basic Metabolic Panel:  Recent Labs Lab 06/30/13 1230 06/30/13 1825 07/01/13 0557 07/02/13 1157  NA 132* 134* 138 134*  K 4.8 4.2 4.4 4.6  CL 100 96 99 95*  CO2 _0 GLUCOSE 440* 395* 196* 360*  BUN 59* 61* 57* 36*  CREATININE 1.3 1.10 0.98 0.78  CALCIUM 8.9 9.5 8.8 9.3   Liver Function Tests:  Recent Labs Lab 06/30/13 1230 06/30/13 1825  AST 35 27  ALT 72* 66*  ALKPHOS 140* 155*  BILITOT 0.5 0.3  PROT 6.3 7.0  ALBUMIN 2.8* 2.8*   No results found for this basename: LIPASE, AMYLASE,  in the last 168 hours No results found for this basename: AMMONIA,  in the last 168 hours CBC:  Recent Labs Lab 06/30/13 1230 06/30/13 1825 07/01/13 0019 07/02/13 0245 07/03/13 0228  WBC 9.7 9.7 9.3 9.9 10.0  NEUTROABS 7.6 7.1  --   --   --   HGB 12.3* 12.3*  12.3* 11.5* 12.6*  HCT 37.3* 37.7* 37.5* 35.4* 39.1  MCV 85.0 85.9 85.6 86.1 87.5  PLT 254.0 242 230 217 278   Cardiac Enzymes:  Recent Labs Lab 06/30/13 1825 07/01/13 0019 07/01/13 0557 07/01/13 1155  TROPONINI <0.30 <0.30 <0.30 <0.30   BNP (last 3 results)  Recent Labs  06/30/13 1825  PROBNP 1443.0*   CBG:  Recent Labs Lab 07/02/13 0800 07/02/13 1146 07/02/13 1554 07/02/13 2141 07/03/13 0741  GLUCAP 385* 354* 274* 129* 225*    Recent Results (from the past 240 hour(s))  MRSA PCR SCREENING     Status: None   Collection Time    06/30/13 10:26 PM      Result Value Ref Range Status   MRSA by PCR NEGATIVE  NEGATIVE Final   Comment:            The GeneXpert MRSA Assay (FDA     approved for NASAL specimens     only), is one component of a     comprehensive MRSA colonization     surveillance program. It  is not     intended to diagnose MRSA     infection nor to guide or     monitor treatment for     MRSA infections.  BODY FLUID CULTURE     Status: None   Collection Time    07/01/13  4:30 PM      Result Value Ref Range Status   Specimen Description FLUID PERICARDIAL   Final   Special Requests NONE   Final   Gram Stain     Final   Value: WBC PRESENT,BOTH PMN AND MONONUCLEAR     NO ORGANISMS SEEN     Performed at [facility]   Culture     Final   Value: NO GROWTH     Performed at [facility]   Report Status PENDING   Incomplete     Studies:  Recent x-ray studies have been reviewed in detail by the Attending Physician  Scheduled Meds:  Scheduled Meds: . amLODipine  5 mg Oral Daily  . aspirin EC  81 mg Oral Daily  . atorvastatin  10 mg Oral q1800  . budesonide-formoterol  2 puff Inhalation BID  . buPROPion  150 mg Oral Daily  . chlorthalidone  25 mg Oral Daily  . docusate sodium  100 mg Oral BID  . glimepiride  4 mg Oral BID WC  . heparin  5,000 Units Subcutaneous 3 times per day  . HYDROmorphone      . insulin aspart  0-20  Units Subcutaneous TID WC  . insulin glargine  15 Units Subcutaneous Daily  . ipratropium  0.5 mg Nebulization QID  . levalbuterol  0.63 mg Nebulization QID  . levofloxacin  500 mg Oral Daily  . Liraglutide  1.8 mg Subcutaneous Daily  . metFORMIN  1,000 mg Oral BID WC  . multivitamin with minerals  1 tablet Oral Daily  . pioglitazone  45 mg Oral Q breakfast  . predniSONE  60 mg Oral Q breakfast  . ramipril  10 mg Oral BID  . sodium chloride  3 mL Intravenous Q12H    Time spent on care of this patient: 40 mins   Allie Bossier , MD   [facility] Office  [phone] Pager - [phone]  On-Call/Text Page:      [email or web address]      password [email or web address]  If 7PM-7AM, please contact night-coverage [email or web address] Password [email or web address] 07/03/2013, 11:28 AM   LOS: 3 days

## 2013-07-03 NOTE — Progress Notes (Addendum)
SUBJECTIVE:   73 y/o smoker admitted with dyspnea and probable COPD flare  Found to have large pericardial effusion and underwent pericardiocentesis of 500cc bloody fluid yesterday. Initial fluid evaluation concerning for malignancy with LDH 550. WBC 1676 with 76% PMNs Cytology pending.   Had AF immediately after the pericardiocentesis, but now back in NSR with PACs.   Breathing better. No fevers or chills. Catheter drained 30cc fluid overnight.  Approximately 850 cc out total since the drain placed.    Intake/Output Summary (Last 24 hours) at 07/03/13 0934 Last data filed at 07/03/13 0800  Gross per 24 hour  Intake    720 ml  Output   4105 ml  Net  -3385 ml    Current Facility-Administered Medications  Medication Dose Route Frequency Mohid Furuya Last Rate Last Dose  . acetaminophen (TYLENOL) tablet 650 mg  650 mg Oral Q4H PRN Jettie Booze, MD      . amLODipine (NORVASC) tablet 5 mg  5 mg Oral Daily Jettie Booze, MD      . aspirin EC tablet 81 mg  81 mg Oral Daily Orvan Falconer, MD   81 mg at 07/03/13 0919  . atorvastatin (LIPITOR) tablet 10 mg  10 mg Oral q1800 Delfina Redwood, MD   10 mg at 07/02/13 1716  . budesonide-formoterol (SYMBICORT) 160-4.5 MCG/ACT inhaler 2 puff  2 puff Inhalation BID Orvan Falconer, MD   2 puff at 07/02/13 2038  . buPROPion (WELLBUTRIN XL) 24 hr tablet 150 mg  150 mg Oral Daily Orvan Falconer, MD   150 mg at 07/03/13 0919  . chlorthalidone (HYGROTON) tablet 25 mg  25 mg Oral Daily Orvan Falconer, MD   25 mg at 07/03/13 0920  . docusate sodium (COLACE) capsule 100 mg  100 mg Oral BID Orvan Falconer, MD   100 mg at 07/03/13 0919  . glimepiride (AMARYL) tablet 4 mg  4 mg Oral BID WC Orvan Falconer, MD   4 mg at 07/03/13 0900  . guaiFENesin-dextromethorphan (ROBITUSSIN DM) 100-10 MG/5ML syrup 5 mL  5 mL Oral Q4H PRN Delfina Redwood, MD   5 mL at 07/02/13 0458  . heparin injection 5,000 Units  5,000 Units Subcutaneous 3 times per day Jettie Booze, MD   5,000  Units at 07/03/13 [phone]  . insulin aspart (novoLOG) injection 0-20 Units  0-20 Units Subcutaneous TID WC Delfina Redwood, MD   7 Units at 07/03/13 0757  . insulin glargine (LANTUS) injection 15 Units  15 Units Subcutaneous Daily Debbe Odea, MD   15 Units at 07/03/13 0900  . ipratropium (ATROVENT) nebulizer solution 0.5 mg  0.5 mg Nebulization QID Delfina Redwood, MD   0.5 mg at 07/02/13 2038  . levalbuterol (XOPENEX) nebulizer solution 0.63 mg  0.63 mg Nebulization QID Delfina Redwood, MD   0.63 mg at 07/02/13 2038  . levofloxacin (LEVAQUIN) tablet 500 mg  500 mg Oral Daily Delfina Redwood, MD   500 mg at 07/03/13 0920  . Liraglutide SOPN 1.8 mg  1.8 mg Subcutaneous Daily Delfina Redwood, MD   1.8 mg at 07/02/13 0800  . metFORMIN (GLUCOPHAGE) tablet 1,000 mg  1,000 mg Oral BID WC Orvan Falconer, MD   1,000 mg at 07/03/13 0900  . multivitamin with minerals tablet 1 tablet  1 tablet Oral Daily Orvan Falconer, MD   1 tablet at 07/03/13 0920  . ondansetron (ZOFRAN) tablet 4 mg  4 mg Oral Q6H PRN Collier Salina  Marin Comment, MD       Or  . ondansetron [facility]) injection 4 mg  4 mg Intravenous Q6H PRN Orvan Falconer, MD      . ondansetron [facility]) injection 4 mg  4 mg Intravenous Q6H PRN Jettie Booze, MD      . pioglitazone (ACTOS) tablet 45 mg  45 mg Oral Q breakfast Debbe Odea, MD   45 mg at 07/03/13 0900  . predniSONE (DELTASONE) tablet 60 mg  60 mg Oral Q breakfast Debbe Odea, MD   60 mg at 07/03/13 0900  . ramipril (ALTACE) capsule 10 mg  10 mg Oral BID Orvan Falconer, MD   10 mg at 07/03/13 6811  . sodium chloride 0.9 % injection 3 mL  3 mL Intravenous Q12H Orvan Falconer, MD   3 mL at 07/02/13 2142    Filed Vitals:   07/03/13 0600 07/03/13 0700 07/03/13 0738 07/03/13 0800  BP: 127/61 138/53 138/53 136/59  Pulse: 84 62 93 81  Temp:   98.3 F (36.8 C)   TempSrc:   Oral   Resp: _0 Height:      Weight:      SpO2: 92% 92% 96% 96%    PHYSICAL EXAM General: NAD Neck: JVP 10 no thyromegaly.  Lungs:  Diminished , no rales or wheezes. CV: Nondisplaced PMI.  Irregular rate and rhythm, normal S1/S2, no S3/S4, no murmur.  No pretibial edema.  No carotid bruit.  Normal pedal pulses. +pericardial drain Abdomen: Soft, nontender, no hepatosplenomegaly, no distention.  Neurologic: Alert and oriented x 3.  Psych: Normal affect. Extremities: No clubbing or cyanosis.   TELEMETRY: Reviewed telemetry pt in sinus rhythm with PAC's and PVC's,   LABS: Basic Metabolic Panel:  Recent Labs  07/01/13 0557 07/02/13 1157  NA 138 134*  K 4.4 4.6  CL 99 95*  CO2 20 25  GLUCOSE 196* 360*  BUN 57* 36*  CREATININE 0.98 0.78  CALCIUM 8.8 9.3   Liver Function Tests:  Recent Labs  06/30/13 1230 06/30/13 1825  AST 35 27  ALT 72* 66*  ALKPHOS 140* 155*  BILITOT 0.5 0.3  PROT 6.3 7.0  ALBUMIN 2.8* 2.8*   No results found for this basename: LIPASE, AMYLASE,  in the last 72 hours CBC:  Recent Labs  06/30/13 1230 06/30/13 1825  07/02/13 0245 07/03/13 0228  WBC 9.7 9.7  < > 9.9 10.0  NEUTROABS 7.6 7.1  --   --   --   HGB 12.3* 12.3*  < > 11.5* 12.6*  HCT 37.3* 37.7*  < > 35.4* 39.1  MCV 85.0 85.9  < > 86.1 87.5  PLT 254.0 242  < > 217 278  < > = values in this interval not displayed. Cardiac Enzymes:  Recent Labs  07/01/13 0019 07/01/13 0557 07/01/13 1155  TROPONINI <0.30 <0.30 <0.30   BNP: No components found with this basename: POCBNP,  D-Dimer: No results found for this basename: DDIMER,  in the last 72 hours Hemoglobin A1C:  Recent Labs  07/01/13 0019  HGBA1C 10.3*   Fasting Lipid Panel: No results found for this basename: CHOL, HDL, LDLCALC, TRIG, CHOLHDL, LDLDIRECT,  in the last 72 hours Thyroid Function Tests:  Recent Labs  07/01/13 0019  TSH 0.918   Anemia Panel:  Recent Labs  06/30/13 1230  FERRITIN 200.6  IRON 21*    RADIOLOGY: Dg Chest 2 View  06/30/2013   CLINICAL DATA:  cough and fatigue  EXAM: CHEST -  2 VIEW  COMPARISON:  07/07/2012  FINDINGS:  Mild cardiomegaly. Small bilateral pleural effusions. Adjacent consolidation or subsegmental atelectasis in the lung bases right greater than left. No overt interstitial edema. Atheromatous aorta. Surgical clips in the upper abdomen. Mild spurring in the thoracic spine.  IMPRESSION: Mild cardiomegaly and small pleural effusions, new since previous exam.   Electronically Signed   By: Arne Cleveland M.D.   On: 06/30/2013 13:13    ASSESSMENT AND PLAN: 1. Pericardial effusion with tamponade - exudative    - s/p percardiocentesis 6/27 - suspect malignant 2. NSVT 3. Acute respiratory failure due to COPD flare and #1 4. AECOPD with ongoing tobacco use 5. DM2 6. PAF -> now back in NSR  Improved with drainage of pericardial effusion. Will plan to pull catheter today; await echocardiogram. Could be malignant but with high PMNs may be infectious/inflammatory but no infectious sx currently- await cytology.  CT C/A/P showed no malignancy.  Nodule present that needs f/u in 3 months.  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   AF resolved. Would not anticoagulate due to bloody pericardial effusion. Can continue heparin DVT prophylaxis for now - watch closely for increased bleeding in pericardial fluid.   Treatment of AECOPD per primary team.   Will try to reposition catheter with echo guidance today.  If no significant fluid, will remove catheter.  Would not plan for cardiac cath at this time since he would not be a good candidate for anticoagulation. We'll need to treat pericarditis as his effusion does appear to be inflammatory. I explained to the daughter that his wall motion was normal and his LV and his troponin has been negative. At this point, he does not need urgent cardiac catheterization.    He will need a steroid taper at discharge for the pericarditis.  VARANASI,JAYADEEP S.,MD 9:34 AM

## 2013-07-03 NOTE — Progress Notes (Addendum)
Addendum to earlier progress note:   Echocardiogram reviewed. No significant residual effusion. Removed the pericardial catheter. 1 mg of IV clotted was given to help with any discomfort. The suture was removed and the catheter was withdrawn. It was aspirated intermittently. There was no significant fluid removed during the withdrawal of the catheter. There was some difficulty in removing the pericardial catheter, but eventually the catheter came out intact. Will watch for a few hours. If no problems, we'll move to telemetry.  I spoke to the patient's daughter. He does need an ischemia workup at some point. I would not perform cardiac cath this time since he has just had a significant bloody pericardial effusion. Already started on steroids to help with any possible pericarditis.  He will need a steroid taper at discharge for the pericarditis.

## 2013-07-03 NOTE — Patient Instructions (Signed)
07/03/2013- Resp care note- Pt instructed on use of flutter valve.  Pt demonstrated understanding.  Device left at bedside for pt personal use.  s brewer rrt, rcp

## 2013-07-03 NOTE — Progress Notes (Signed)
  Echocardiogram 2D Echocardiogram (limited) has been performed.  [place], [place] 07/03/2013, 10:06 AM

## 2013-07-04 DIAGNOSIS — E1159 Type 2 diabetes mellitus with other circulatory complications: Secondary | ICD-10-CM

## 2013-07-04 DIAGNOSIS — I319 Disease of pericardium, unspecified: Secondary | ICD-10-CM

## 2013-07-04 LAB — CBC WITH DIFFERENTIAL/PLATELET
BASOS ABS: 0 10*3/uL (ref 0.0–0.1)
Basophils Relative: 0 % (ref 0–1)
EOS ABS: 0 10*3/uL (ref 0.0–0.7)
EOS PCT: 0 % (ref 0–5)
HCT: 42.5 % (ref 39.0–52.0)
Hemoglobin: 13.4 g/dL (ref 13.0–17.0)
LYMPHS PCT: 11 % — AB (ref 12–46)
Lymphs Abs: 1.2 10*3/uL (ref 0.7–4.0)
MCH: 27.6 pg (ref 26.0–34.0)
MCHC: 31.5 g/dL (ref 30.0–36.0)
MCV: 87.4 fL (ref 78.0–100.0)
Monocytes Absolute: 1 10*3/uL (ref 0.1–1.0)
Monocytes Relative: 10 % (ref 3–12)
NEUTROS PCT: 79 % — AB (ref 43–77)
Neutro Abs: 8.1 10*3/uL — ABNORMAL HIGH (ref 1.7–7.7)
PLATELETS: 273 10*3/uL (ref 150–400)
RBC: 4.86 MIL/uL (ref 4.22–5.81)
RDW: 14.1 % (ref 11.5–15.5)
WBC: 10.2 10*3/uL (ref 4.0–10.5)

## 2013-07-04 LAB — LIPID PANEL
CHOLESTEROL: 126 mg/dL (ref 0–200)
HDL: 36 mg/dL — ABNORMAL LOW (ref >39)
LDL Cholesterol: 63 mg/dL (ref 0–99)
Total CHOL/HDL Ratio: 3.5 RATIO
Triglycerides: 134 mg/dL (ref <150)
VLDL: 27 mg/dL (ref 0–40)

## 2013-07-04 LAB — COMPREHENSIVE METABOLIC PANEL
ALK PHOS: 123 U/L — AB (ref 39–117)
ALT: 31 U/L (ref 0–53)
AST: 14 U/L (ref 0–37)
Albumin: 2.4 g/dL — ABNORMAL LOW (ref 3.5–5.2)
BILIRUBIN TOTAL: 0.2 mg/dL — AB (ref 0.3–1.2)
BUN: 26 mg/dL — AB (ref 6–23)
CHLORIDE: 96 meq/L (ref 96–112)
CO2: 26 meq/L (ref 19–32)
CREATININE: 0.64 mg/dL (ref 0.50–1.35)
Calcium: 9.6 mg/dL (ref 8.4–10.5)
GFR calc Af Amer: >90 mL/min (ref >90)
Glucose, Bld: 259 mg/dL — ABNORMAL HIGH (ref 70–99)
Potassium: 5.1 mEq/L (ref 3.7–5.3)
Sodium: 136 mEq/L — ABNORMAL LOW (ref 137–147)
Total Protein: 6.1 g/dL (ref 6.0–8.3)

## 2013-07-04 LAB — GLUCOSE, CAPILLARY
GLUCOSE-CAPILLARY: 199 mg/dL — AB (ref 70–99)
Glucose-Capillary: 282 mg/dL — ABNORMAL HIGH (ref 70–99)
Glucose-Capillary: 371 mg/dL — ABNORMAL HIGH (ref 70–99)

## 2013-07-04 MED ORDER — BUPROPION HCL ER (SR) 150 MG PO TB12
150.0000 mg | ORAL_TABLET | Freq: Two times a day (BID) | ORAL | Status: DC
  Filled 2013-07-04: qty 1

## 2013-07-04 MED ORDER — LEVOFLOXACIN 500 MG PO TABS: 500.0000 mg | ORAL_TABLET | Freq: Every day | ORAL | Status: DC

## 2013-07-04 MED ORDER — BUPROPION HCL ER (SR) 150 MG PO TB12: 150.0000 mg | ORAL_TABLET | Freq: Two times a day (BID) | ORAL | Status: DC

## 2013-07-04 MED ORDER — PREDNISONE 20 MG PO TABS: 60.0000 mg | ORAL_TABLET | Freq: Every day | ORAL | Status: DC

## 2013-07-04 NOTE — Progress Notes (Signed)
Dr. Meda Coffee called to report that the 2D echo has been read and there is no evidence of reoccurence of the pericardial effusion.  Dr. Sherral Hammers was called and informed.  He states he will come up and plans to dc patient to home this evening.

## 2013-07-04 NOTE — Progress Notes (Signed)
Echocardiogram 2D Echocardiogram limited has been performed.  BROWN, CALEBA 07/04/2013, 11:50 AM

## 2013-07-04 NOTE — Progress Notes (Signed)
SUBJECTIVE:   73 y/o smoker admitted with dyspnea and probable COPD flare  Found to have large pericardial effusion and underwent pericardiocentesis of 500cc bloody fluid yesterday. Initial fluid evaluation concerning for malignancy with LDH 550. WBC 1676 with 76% PMNs Cytology pending.   Had AF immediately after the pericardiocentesis, but now back in NSR with PACs.   Breathing better. No fevers or chills. Catheter removed yesterday.    Intake/Output Summary (Last 24 hours) at 07/04/13 1100 Last data filed at 07/04/13 0900  Gross per 24 hour  Intake    600 ml  Output   1820 ml  Net  -1220 ml    Current Facility-Administered Medications  Medication Dose Route Frequency Provider Last Rate Last Dose  . acetaminophen (TYLENOL) tablet 650 mg  650 mg Oral Q4H PRN Jettie Booze, MD      . amLODipine (NORVASC) tablet 5 mg  5 mg Oral Daily Jettie Booze, MD   5 mg at 07/04/13 1054  . aspirin EC tablet 81 mg  81 mg Oral Daily Orvan Falconer, MD   81 mg at 07/04/13 1055  . atorvastatin (LIPITOR) tablet 10 mg  10 mg Oral q1800 Delfina Redwood, MD   10 mg at 07/03/13 1646  . budesonide-formoterol (SYMBICORT) 160-4.5 MCG/ACT inhaler 2 puff  2 puff Inhalation BID Orvan Falconer, MD   2 puff at 07/04/13 0950  . buPROPion (WELLBUTRIN XL) 24 hr tablet 150 mg  150 mg Oral Daily Orvan Falconer, MD   150 mg at 07/04/13 1055  . chlorthalidone (HYGROTON) tablet 25 mg  25 mg Oral Daily Orvan Falconer, MD   25 mg at 07/04/13 1055  . dextromethorphan-guaiFENesin (MUCINEX DM) 30-600 MG per 12 hr tablet 1 tablet  1 tablet Oral BID Allie Bossier, MD   1 tablet at 07/04/13 1054  . docusate sodium (COLACE) capsule 100 mg  100 mg Oral BID Orvan Falconer, MD   100 mg at 07/04/13 1055  . glimepiride (AMARYL) tablet 4 mg  4 mg Oral BID WC Orvan Falconer, MD   4 mg at 07/04/13 0800  . heparin injection 5,000 Units  5,000 Units Subcutaneous 3 times per day Jettie Booze, MD   5,000 Units at 07/04/13 [phone]  . insulin  aspart (novoLOG) injection 0-20 Units  0-20 Units Subcutaneous TID WC Delfina Redwood, MD   4 Units at 07/04/13 0809  . insulin glargine (LANTUS) injection 22 Units  22 Units Subcutaneous Daily Allie Bossier, MD   22 Units at 07/04/13 1055  . ipratropium (ATROVENT) nebulizer solution 0.5 mg  0.5 mg Nebulization QID Delfina Redwood, MD   0.5 mg at 07/04/13 0950  . levalbuterol (XOPENEX) nebulizer solution 0.63 mg  0.63 mg Nebulization QID Delfina Redwood, MD   0.63 mg at 07/04/13 0950  . levofloxacin (LEVAQUIN) tablet 500 mg  500 mg Oral Daily Delfina Redwood, MD   500 mg at 07/04/13 1054  . Liraglutide SOPN 1.8 mg  1.8 mg Subcutaneous Daily Delfina Redwood, MD   1.8 mg at 07/03/13 1000  . metFORMIN (GLUCOPHAGE) tablet 1,000 mg  1,000 mg Oral BID WC Orvan Falconer, MD   1,000 mg at 07/04/13 0800  . multivitamin with minerals tablet 1 tablet  1 tablet Oral Daily Orvan Falconer, MD   1 tablet at 07/04/13 1055  . ondansetron (ZOFRAN) tablet 4 mg  4 mg Oral Q6H PRN Orvan Falconer, MD  Or  . ondansetron (ZOFRAN) injection 4 mg  4 mg Intravenous Q6H PRN Orvan Falconer, MD      . ondansetron [facility]) injection 4 mg  4 mg Intravenous Q6H PRN Jettie Booze, MD      . pioglitazone (ACTOS) tablet 45 mg  45 mg Oral Q breakfast Debbe Odea, MD   45 mg at 07/04/13 0809  . predniSONE (DELTASONE) tablet 60 mg  60 mg Oral Q breakfast Debbe Odea, MD   60 mg at 07/04/13 0800  . ramipril (ALTACE) capsule 10 mg  10 mg Oral BID Orvan Falconer, MD   10 mg at 07/04/13 1055  . sodium chloride 0.9 % injection 3 mL  3 mL Intravenous Q12H Orvan Falconer, MD        Filed Vitals:   07/04/13 0200 07/04/13 0400 07/04/13 0600 07/04/13 0800  BP: 128/61  136/44 146/60  Pulse: 90 82 77 82  Temp:  98.5 F (36.9 C)  98.6 F (37 C)  TempSrc:  Oral  Oral  Resp:    19  Height:      Weight:      SpO2: 89% 91% 91% 96%    PHYSICAL EXAM General: NAD Neck: JVP 10 no thyromegaly.  Lungs: Diminished , no rales or wheezes. CV:  Nondisplaced PMI.  Irregular rate and rhythm, normal S1/S2, no S3/S4, no murmur.  No pretibial edema.  No carotid bruit.  Normal pedal pulses.  Abdomen: Soft, nontender, no hepatosplenomegaly, no distention.  Neurologic: Alert and oriented x 3.  Psych: Normal affect. Extremities: No clubbing or cyanosis.   TELEMETRY: Reviewed telemetry pt in sinus rhythm with PAC's and PVC's,   LABS: Basic Metabolic Panel:  Recent Labs  07/02/13 1157 07/04/13 0325  NA 134* 136*  K 4.6 5.1  CL 95* 96  CO2 25 26  GLUCOSE 360* 259*  BUN 36* 26*  CREATININE 0.78 0.64  CALCIUM 9.3 9.6   Liver Function Tests:  Recent Labs  07/04/13 0325  AST 14  ALT 31  ALKPHOS 123*  BILITOT 0.2*  PROT 6.1  ALBUMIN 2.4*   No results found for this basename: LIPASE, AMYLASE,  in the last 72 hours CBC:  Recent Labs  07/03/13 0228 07/04/13 0325  WBC 10.0 10.2  NEUTROABS  --  8.1*  HGB 12.6* 13.4  HCT 39.1 42.5  MCV 87.5 87.4  PLT 278 273   Cardiac Enzymes:  Recent Labs  07/01/13 1155  TROPONINI <0.30   BNP: No components found with this basename: POCBNP,  D-Dimer: No results found for this basename: DDIMER,  in the last 72 hours Hemoglobin A1C: No results found for this basename: HGBA1C,  in the last 72 hours Fasting Lipid Panel:  Recent Labs  07/04/13 0325  CHOL 126  HDL 36*  LDLCALC 63  TRIG 134  CHOLHDL 3.5   Thyroid Function Tests: No results found for this basename: TSH, T4TOTAL, FREET3, T3FREE, THYROIDAB,  in the last 72 hours Anemia Panel: No results found for this basename: VITAMINB12, FOLATE, FERRITIN, TIBC, IRON, RETICCTPCT,  in the last 72 hours  RADIOLOGY: Dg Chest 2 View  06/30/2013   CLINICAL DATA:  cough and fatigue  EXAM: CHEST - 2 VIEW  COMPARISON:  07/07/2012  FINDINGS: Mild cardiomegaly. Small bilateral pleural effusions. Adjacent consolidation or subsegmental atelectasis in the lung bases right greater than left. No overt interstitial edema. Atheromatous  aorta. Surgical clips in the upper abdomen. Mild spurring in the thoracic spine.  IMPRESSION: Mild cardiomegaly and  small pleural effusions, new since previous exam.   Electronically Signed   By: Arne Cleveland M.D.   On: 06/30/2013 13:13    ASSESSMENT AND PLAN: 1. Pericardial effusion with tamponade - exudative    - s/p percardiocentesis 6/27 -  2. NSVT 3. Acute respiratory failure due to COPD flare and #1 4. AECOPD with ongoing tobacco use 5. DM2 6. PAF -> now back in NSR  Feels much better.  Could be malignant but with high PMNs may be infectious/inflammatory but no infectious sx currently- await cytology.  CT C/A/P showed no malignancy.  Nodule present that needs f/u in 3 months.  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   AF resolved. Would not anticoagulate due to bloody pericardial effusion. Can continue heparin DVT prophylaxis for now - watch closely for increased bleeding in pericardial fluid.   Treatment of AECOPD per primary team.   Repeat echo today.  If no significant fluid, ok for discharge from a cardiac standpoint.  Would not plan for cardiac cath at this time since he would not be a good candidate for anticoagulation. We'll need to treat pericarditis as his effusion does appear to be inflammatory. I explained to the daughter that his wall motion was normal and his LV and his troponin has been negative. At this point, he does not need urgent cardiac catheterization.    He will need a steroid taper at discharge for the pericarditis.  F/u with me in 2 weeks  VARANASI,JAYADEEP S.,MD 11:00 AM

## 2013-07-04 NOTE — Discharge Summary (Signed)
Physician Discharge Summary  Joshua [name]:[id number] DOB: Nov 30, 1940 DOA: 06/30/2013  PCP: Tammi Sou, MD  Admit date: 06/30/2013 Discharge date: 07/04/2013  Time spent: 40 minutes  Recommendations for Outpatient Follow-up:  Pericardial effusion with tamponade physiology  - s/p pericardiocentesis yesterday- fluid appears to be blood tinged and infectious  - Pericardial fluid cultures NGTD  - Prednisone 60 mg daily for 2 weeks until he follows up with cardiology. Cardiology can then taper patient over 3 months.    COPD with exacerbation- Acute resp failure  -Continue xopenex nebulizer QID  -Continued Symbicort 160-4.5 mcg BID.  -Encouraged smoking cessation. (Patient with extensive smoking history 3-4 PPD x40 years) -Last day of levofloxacin 4 July  Acute bronchitis:  - On Levaquin last dose 4 July - Robitussin-DM BID  -Flutter valve q 4hr while awake   Atrial fibrillation with RVR:  -Currently rate controlled, in NSR continue to monitor   Type II or unspecified type diabetes mellitus without mention of complication, uncontrolled.  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 Will counsel patient that uncontrolled diabetes is also grounds for loss of his CDL license  - Discharge on Metformin 1000 mg BID -Discharge on, Victoza -Discharge on Amaryl 4 mg BID (max dose) - Discharge on Actos 43m Daily (max dose) - Patient and wife advised that most likely will not be able to get down to the ADA recommended hemoglobin A1c of <7. 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 on Lantus and discussed with PCP    HTN (hypertension), benign  - Controlled, continue Ramipril 10 mg BID  -Continue Clorthalidone 25 mg daily   Tobacco dependence  -Continue Wellbutrin 150 mg BID  Hyperlipidemia  - Patient to resume  lovastatin 40 mg BID   -lipid panel LDL and TG within ADA guidelines      Discharge Diagnoses:  Active Problems:   Type II or unspecified type diabetes mellitus without mention of complication, uncontrolled   HTN (hypertension), benign   Tobacco dependence   Hyperlipidemia   CKD (chronic kidney disease), stage I   Nonspecific abnormal electrocardiogram (ECG) (EKG)   COPD with exacerbation   Acute bronchitis   Pleural effusion with elevated pro BNP   Pericardial effusion with cardiac tamponade   Pericardial effusion   Acute and chronic respiratory failure   Atrial fibrillation   Discharge Condition: Stable Diet recommendation:  Heart healthy/carb modified  Filed Weights   06/30/13 1807 06/30/13 2225  Weight: 90.719 kg (200 lb) 89.8 kg (197 lb 15.6 oz)    History of present illness:  Joshua BUTTERYis an 73y.o. WM PMHx f tobacco abuse, COPD, HTN, DM Type 2, and hyperlipidemia, presents to his PCP with complain of weakness for the past 3 weeks. His main complaint was that of weakness. CXR was done showing ?small bilateral effusion, along with CM. His EKG showed ? ST elevation. He had no chest pain but did complain of bilateral shoulder pain, non exertional. [facility] ? [facility]. It was not felt that his EKG represented STEMI, and he was transferred here for cardiology to see him. He was started on IV Heparin. Upon arrival, he was asymptomatic. He did have a cough for the past 3 weeks, and  audible wheezing, but no ill contact, distant travel, fever or chills. His troponin was negative, BS was 300;'s and Cr 1.1. His liver fx was only modestly elevated on statin. His one stress factor was that his children are getting a divorce. Hospitalist was asked to admit him for further work up. I saw him with Dr Radford Pax in his room in the presence of his family. 6/30 patient A./O. x4, NAD states does not use home O2. Negative CP/SOB   Hospital Course:   6/27 Pericardiocentesis   Consultants: Dr.VARANASI,JAYADEEP (cardiology)     Procedure/Significant Events:  6/27 Echocardiogram; - LVEF= 55%- 60%. - Mitral valve: mild regurgitation. - Pericardium, extracardiac: A large pericardial effusion was identified circumferential to the heart. Mild right atrial chamber collapse. There was mild right ventricular chamber collapse. Features were consistent with tamponade physiology. 6/27 Pericardiocentesis 6/27 pericardial fluid NGTD  6/28 CT abdomen pelvis and chest with contrast  - No evidence of malignancy in the chest or abdomen.  - Pericardial thickening and trace remaining effusion. No discrete pericardial nodule.  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  - Bilateral pulmonary artery calcifications, emote pulmonary embolism?  -.5 cm ground-glass nodule at the right apex likely infectious vs or inflammatory.  6/30 echocardiogram; only trivial pericardial effusion that is unchanged from the study performed on 07/03/2013. No signs of hemodynamic compromise.    Culture  6/27 pericardial fluid NGTD   Antibiotics:  Levofloxacin 6/27>> last day 4 July     Discharge Exam: Filed Vitals:   07/04/13 0800 07/04/13 1100 07/04/13 1235 07/04/13 1600  BP: 146/60 145/66 112/61 116/53  Pulse: 82     Temp: 98.6 F (37 C)  98.4 F (36.9 C) 98.3 F (36.8 C)  TempSrc: Oral  Oral Oral  Resp: _0 Height:      Weight:      SpO2: 96%  94% 93%   General: A./O. x4, NAD, No acute respiratory distress  Lungs: Diffuse expiratory wheezing negative crackles  Cardiovascular: Regular rate and rhythm without murmur gallop or rub normal S1 and S2  Abdomen: Nontender, nondistended, soft, bowel sounds positive, no rebound, no ascites, no appreciable mass  Extremities: No significant cyanosis, clubbing, or edema bilateral lower extremities  Discharge  Instructions     Medication List    STOP taking these medications       buPROPion 150 MG 24 hr tablet  Commonly known as:  WELLBUTRIN XL  Replaced by:  buPROPion 150 MG 12 hr tablet      TAKE these medications       aspirin 81 MG tablet  Take 81 mg by mouth daily.     budesonide-formoterol 160-4.5 MCG/ACT inhaler  Commonly known as:  SYMBICORT  Inhale 2 puffs into the lungs 2 (two) times daily.     buPROPion 150 MG 12 hr tablet  Commonly known as:  WELLBUTRIN SR  Take 1 tablet (150 mg total) by mouth 2 (two) times daily.     chlorthalidone 25 MG tablet  Commonly known as:  HYGROTON  Take 1 tablet (25 mg total) by mouth daily.     Chromium Picolinate 1000 MCG Tabs  Take 1 tablet by mouth daily.     Dapagliflozin Propanediol 5 MG Tabs  Commonly known as:  FARXIGA  Take 5 mg by mouth daily.     ferrous sulfate 325 (65 FE) MG tablet  Take 325 mg by mouth daily with  breakfast.     glimepiride 4 MG tablet  Commonly known as:  AMARYL  Take 1 tablet (4 mg total) by mouth 2 (two) times daily.     levofloxacin 500 MG tablet  Commonly known as:  LEVAQUIN  Take 1 tablet (500 mg total) by mouth daily.     Liraglutide 18 MG/3ML Sopn  Commonly known as:  VICTOZA  Inject 1.8 mg into the skin daily.     lovastatin 40 MG tablet  Commonly known as:  MEVACOR  Take 1 tablet (40 mg total) by mouth 2 (two) times daily.     metFORMIN 1000 MG tablet  Commonly known as:  GLUCOPHAGE  Take 1 tablet (1,000 mg total) by mouth 2 (two) times daily with a meal.     multivitamin with minerals Tabs tablet  Take 1 tablet by mouth daily.     pioglitazone 45 MG tablet  Commonly known as:  ACTOS  Take 1 tablet (45 mg total) by mouth daily.     predniSONE 20 MG tablet  Commonly known as:  DELTASONE  Take 3 tablets (60 mg total) by mouth daily with breakfast.     ramipril 10 MG capsule  Commonly known as:  ALTACE  Take 1 capsule (10 mg total) by mouth 2 (two) times daily.      Vitamin D-3 1000 UNITS Caps  Take 1 capsule by mouth daily.       Allergies  Allergen Reactions  . Demerol [Meperidine] Nausea Only  . Morphine And Related Other (See Comments)    Drenched with perspiration  . Starlix [Nateglinide] Other (See Comments)    gassy   Follow-up Information   Follow up with MCGOWEN,PHILIP H, MD. Schedule an appointment as soon as possible for a visit in 1 week. [facility] followup; acute pericarditis, uncontrolled diabetes)    Specialty:  Family Medicine   Contact information:   [address] [place] [address] [phone]       Follow up with Jettie Booze., MD. Schedule an appointment as soon as possible for a visit in 2 weeks. [facility] followup acute pericarditis; requires steroid taper)    Specialty:  Interventional Cardiology   Contact information:   [address] [place] [address] [phone]        The results of significant diagnostics from this hospitalization (including imaging, microbiology, ancillary and laboratory) are listed below for reference.    Significant Diagnostic Studies: Dg Chest 2 View  06/30/2013   CLINICAL DATA:  cough and fatigue  EXAM: CHEST - 2 VIEW  COMPARISON:  07/07/2012  FINDINGS: Mild cardiomegaly. Small bilateral pleural effusions. Adjacent consolidation or subsegmental atelectasis in the lung bases right greater than left. No overt interstitial edema. Atheromatous aorta. Surgical clips in the upper abdomen. Mild spurring in the thoracic spine.  IMPRESSION: Mild cardiomegaly and small pleural effusions, new since previous exam.   Electronically Signed   By: Arne Cleveland M.D.   On: 06/30/2013 13:13   Ct Chest W Contrast  07/03/2013   CLINICAL DATA:  Bloody pericardial effusion.  Assess for malignancy.  EXAM: CT CHEST, ABDOMEN, AND PELVIS WITH CONTRAST  TECHNIQUE: Multidetector CT imaging of the chest, abdomen and pelvis was performed following the standard protocol during  bolus administration of intravenous contrast.  CONTRAST:  122m OMNIPAQUE IOHEXOL 300 MG/ML  SOLN  COMPARISON:  None.  FINDINGS: CT CHEST FINDINGS  THORACIC INLET/BODY WALL:  No adenopathy or acute abnormality.  MEDIASTINUM:  No  cardiomegaly. There is a subxiphoid pericardial drain which wraps around the posterior heart. Minimal pericardial fluid remains. There is enhancing and thickened pericardium, without focal nodularity. Diffuse coronary artery atherosclerosis. Dense aortic annular calcification. No acute aortic findings. There is scattered punctate calcification involving the pulmonary arteries. No evidence of acute pulmonary embolus or definitive pulmonary hypertension. No lymphadenopathy.  LUNG WINDOWS:  There are small, layering bilateral pleural effusions. Bilateral calcified pleural plaques with a Deep pendant and basilar predominance. No concerning pleural nodularity. There is dependent atelectasis. Subpleural ground-glass density at the right apex measuring up to 2.4 cm. No evidence of suspicious, solid, nodule.  OSSEOUS:  No acute fracture.  No suspicious lytic or blastic lesions.  CT ABDOMEN AND PELVIS FINDINGS  BODY WALL: Unremarkable.  Liver: No focal abnormality.  Biliary: Cholecystectomy.  Pancreas: Unremarkable.  Spleen: Unremarkable.  Adrenals: Unremarkable.  Kidneys and ureters: No hydronephrosis or stone. Small, incidental pelvic cysts.  Bladder: Unremarkable.  Reproductive: Unremarkable.  Bowel: No obstruction. No pericecal inflammation. Small area of high-density medial to the second portion duodenum is likely contrast within a small diverticulum.  Retroperitoneum: No mass or adenopathy.  Peritoneum: No ascites or pneumoperitoneum.  Vascular: No acute abnormality.  OSSEOUS: Total right hip arthroplasty, with right gluteal and psoas atrophy. The imaged portions show no hardware failure. Diffuse degenerative disc disease. There are flowing osteophytes in the mid thoracic spine resulting in  multi level ankylosis. Advanced lower lumbar facet osteoarthritis.  IMPRESSION: 1. No evidence of malignancy in the chest or abdomen. 2. Pericardial thickening and trace remaining effusion. No discrete pericardial nodule. 3. 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 4. Bilateral pulmonary artery calcifications, suspect remote pulmonary embolism. 5. 2.5 cm ground-glass nodule at the right apex is likely infectious or inflammatory. Recommend three-month follow-up chest CT to document clearance.   Electronically Signed   By: Jorje Guild M.D.   On: 07/03/2013 06:49   Ct Abdomen Pelvis W Contrast  07/03/2013   CLINICAL DATA:  Bloody pericardial effusion.  Assess for malignancy.  EXAM: CT CHEST, ABDOMEN, AND PELVIS WITH CONTRAST  TECHNIQUE: Multidetector CT imaging of the chest, abdomen and pelvis was performed following the standard protocol during bolus administration of intravenous contrast.  CONTRAST:  147m OMNIPAQUE IOHEXOL 300 MG/ML  SOLN  COMPARISON:  None.  FINDINGS: CT CHEST FINDINGS  THORACIC INLET/BODY WALL:  No adenopathy or acute abnormality.  MEDIASTINUM:  No cardiomegaly. There is a subxiphoid pericardial drain which wraps around the posterior heart. Minimal pericardial fluid remains. There is enhancing and thickened pericardium, without focal nodularity. Diffuse coronary artery atherosclerosis. Dense aortic annular calcification. No acute aortic findings. There is scattered punctate calcification involving the pulmonary arteries. No evidence of acute pulmonary embolus or definitive pulmonary hypertension. No lymphadenopathy.  LUNG WINDOWS:  There are small, layering bilateral pleural effusions. Bilateral calcified pleural plaques with a Deep pendant and basilar predominance. No concerning pleural nodularity. There is dependent atelectasis. Subpleural ground-glass density at the right apex measuring up to 2.4 cm.  No evidence of suspicious, solid, nodule.  OSSEOUS:  No acute fracture.  No suspicious lytic or blastic lesions.  CT ABDOMEN AND PELVIS FINDINGS  BODY WALL: Unremarkable.  Liver: No focal abnormality.  Biliary: Cholecystectomy.  Pancreas: Unremarkable.  Spleen: Unremarkable.  Adrenals: Unremarkable.  Kidneys and ureters: No hydronephrosis or stone. Small, incidental pelvic cysts.  Bladder: Unremarkable.  Reproductive: Unremarkable.  Bowel: No obstruction. No pericecal inflammation. Small area of high-density medial  to the second portion duodenum is likely contrast within a small diverticulum.  Retroperitoneum: No mass or adenopathy.  Peritoneum: No ascites or pneumoperitoneum.  Vascular: No acute abnormality.  OSSEOUS: Total right hip arthroplasty, with right gluteal and psoas atrophy. The imaged portions show no hardware failure. Diffuse degenerative disc disease. There are flowing osteophytes in the mid thoracic spine resulting in multi level ankylosis. Advanced lower lumbar facet osteoarthritis.  IMPRESSION: 1. No evidence of malignancy in the chest or abdomen. 2. Pericardial thickening and trace remaining effusion. No discrete pericardial nodule. 3. 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 4. Bilateral pulmonary artery calcifications, suspect remote pulmonary embolism. 5. 2.5 cm ground-glass nodule at the right apex is likely infectious or inflammatory. Recommend three-month follow-up chest CT to document clearance.   Electronically Signed   By: Jorje Guild M.D.   On: 07/03/2013 06:49    Microbiology: Recent Results (from the past 240 hour(s))  MRSA PCR SCREENING     Status: None   Collection Time    06/30/13 10:26 PM      Result Value Ref Range Status   MRSA by PCR NEGATIVE  NEGATIVE Final   Comment:            The GeneXpert MRSA Assay (FDA     approved for NASAL specimens     only), is one component of a      comprehensive MRSA colonization     surveillance program. It is not     intended to diagnose MRSA     infection nor to guide or     monitor treatment for     MRSA infections.  BODY FLUID CULTURE     Status: None   Collection Time    07/01/13  4:30 PM      Result Value Ref Range Status   Specimen Description FLUID PERICARDIAL   Final   Special Requests NONE   Final   Gram Stain     Final   Value: WBC PRESENT,BOTH PMN AND MONONUCLEAR     NO ORGANISMS SEEN     Performed at [facility]   Culture     Final   Value: NO GROWTH 2 DAYS     Performed at [facility]   Report Status PENDING   Incomplete     Labs: Basic Metabolic Panel:  Recent Labs Lab 06/30/13 1230 06/30/13 1825 07/01/13 0557 07/02/13 1157 07/04/13 0325  NA 132* 134* 138 134* 136*  K 4.8 4.2 4.4 4.6 5.1  CL 100 96 99 95* 96  CO2 _0 GLUCOSE 440* 395* 196* 360* 259*  BUN 59* 61* 57* 36* 26*  CREATININE 1.3 1.10 0.98 0.78 0.64  CALCIUM 8.9 9.5 8.8 9.3 9.6   Liver Function Tests:  Recent Labs Lab 06/30/13 1230 06/30/13 1825 07/04/13 0325  AST 35 27 14  ALT 72* 66* 31  ALKPHOS 140* 155* 123*  BILITOT 0.5 0.3 0.2*  PROT 6.3 7.0 6.1  ALBUMIN 2.8* 2.8* 2.4*   No results found for this basename: LIPASE, AMYLASE,  in the last 168 hours No results found for this basename: AMMONIA,  in the last 168 hours CBC:  Recent Labs Lab 06/30/13 1230 06/30/13 1825 07/01/13 0019 07/02/13 0245 07/03/13 0228 07/04/13 0325  WBC 9.7 9.7 9.3 9.9 10.0 10.2  NEUTROABS 7.6 7.1  --   --   --  8.1*  HGB 12.3* 12.3*  12.3* 11.5* 12.6* 13.4  HCT 37.3* 37.7* 37.5* 35.4* 39.1 42.5  MCV 85.0 85.9 85.6 86.1 87.5 87.4  PLT 254.0 242 230 217 278 273   Cardiac Enzymes:  Recent Labs Lab 06/30/13 1825 07/01/13 0019 07/01/13 0557 07/01/13 1155  TROPONINI <0.30 <0.30 <0.30 <0.30   BNP: BNP (last 3 results)  Recent Labs  06/30/13 1825  PROBNP 1443.0*   CBG:  Recent Labs Lab  07/03/13 1607 07/03/13 2135 07/04/13 0800 07/04/13 1234 07/04/13 1600  GLUCAP 274* 282* 199* 282* 371*       Signed:  Dia Crawford, MD [facility] [phone] pager

## 2013-07-05 ENCOUNTER — Encounter: Payer: Self-pay | Admitting: Family Medicine

## 2013-07-05 LAB — BODY FLUID CULTURE: Culture: NO GROWTH

## 2013-07-06 ENCOUNTER — Encounter: Payer: Self-pay | Admitting: Family Medicine

## 2013-07-06 ENCOUNTER — Ambulatory Visit (INDEPENDENT_AMBULATORY_CARE_PROVIDER_SITE_OTHER): Payer: Medicare HMO | Admitting: Family Medicine

## 2013-07-06 VITALS — BP 109/69 | HR 93 | Temp 97.4°F | Resp 18 | Ht 71.0 in | Wt 191.0 lb

## 2013-07-06 DIAGNOSIS — I313 Pericardial effusion (noninflammatory): Secondary | ICD-10-CM

## 2013-07-06 DIAGNOSIS — I3139 Other pericardial effusion (noninflammatory): Secondary | ICD-10-CM

## 2013-07-06 DIAGNOSIS — IMO0001 Reserved for inherently not codable concepts without codable children: Secondary | ICD-10-CM

## 2013-07-06 DIAGNOSIS — I319 Disease of pericardium, unspecified: Secondary | ICD-10-CM

## 2013-07-06 DIAGNOSIS — E1165 Type 2 diabetes mellitus with hyperglycemia: Secondary | ICD-10-CM

## 2013-07-06 DIAGNOSIS — J441 Chronic obstructive pulmonary disease with (acute) exacerbation: Secondary | ICD-10-CM

## 2013-07-06 DIAGNOSIS — I10 Essential (primary) hypertension: Secondary | ICD-10-CM

## 2013-07-06 MED ORDER — INSULIN DETEMIR 100 UNIT/ML FLEXPEN: PEN_INJECTOR | SUBCUTANEOUS | Status: DC

## 2013-07-06 NOTE — Patient Instructions (Signed)
Check glucose fasting every morning: goal range is 100-110. Take 10 Units of levemir insulin at bedtime every night. Adjust this dose up 1-2 units every night until fasting glucose is consistently in range of 100-110.  Stop Victoza.

## 2013-07-06 NOTE — Progress Notes (Signed)
Pre visit review using our clinic review tool, if applicable. No additional management support is needed unless otherwise documented below in the visit note.

## 2013-07-06 NOTE — Progress Notes (Signed)
OFFICE VISIT  07/06/2013   CC:  Chief Complaint  Patient presents with  . Hospitalization Follow-up   HPI:    Patient is a 73 y.o. Caucasian male who presents for hospital f/u: was admitted 6/26 to 07/04/13 for COPD exacerbation and also found to have pericarditis.  All hosp notes, imaging, procedures, labs reviewed today. Pericardial fluid analysis supported dx of inflammation or infection, not malignancy.  He was d/c'd home on oral steroids for this and the plan is to start tapering these down upon f/u with cardiology.  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  He is open to starting levimir now, which we dicussed in depth today.  Generalized weakness is his only complaint today.  No SOB, no cp, no dizziness, no wheezing.  Has not required rescue albuterol since coming home. Glucose this morning was 197.  Past Medical History  Diagnosis Date  . DM type 2 (diabetes mellitus, type 2)     Poor control on max oral meds--pt refused insulin on multiple occasions due to fear of losing CDL license/job.  . Hypertension   . Tobacco dependence     Prior failure to quit with wellbutrin.  100+ pack-yr history.  Quit for 1 mo to get hip replacement (1 pack/day)  . Obesity   . Hyperplastic colon polyp 2001  . Erectile dysfunction     Normal testosterone  . Adenomatous colon polyp 10/16/2011    Repeat 2018  . 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 pylori neg, no dysplasia or malignancy.  . Macular degeneration, dry     Mild, bilat (Optometrist, Mayford Knife at [facility] [place] in [place], [place])  . History of pneumonia     YRS AGO  . Iron deficiency anemia 2014    03/2012 capsule endoscopy showed 2 AVMs--likely responsible for his IDA--lifetime iron supp recommended + q95moCBCs.  . 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 COPD (chronic obstructive pulmonary  disease)     Spirometry  2004 borderline obstruction  . Hyperlipidemia   . CKD (chronic kidney disease), stage I   . Diabetic nephropathy     Elevated urine microalb/cr 03/2011  . Pericardial effusion with cardiac tamponade 6//29/15    Infectious/inflamm (cytology showed NO MALIGNANT CELLS)    Past Surgical History  Procedure Laterality Date  . Cholecystectomy open  1999  . Appendectomy  1957  . Hip surgery  age 73    Repairof slipped capital femoral epiphysis.  . Knee surgery  1979 or so    left; bolt + 3 screws to repair tib plateau fx  . Testicle surgery  as a child    Undescended testicle brought down into scrotum  . Tonsillectomy  1947  . Colonoscopy  10/16/2011    Procedure: COLONOSCOPY;  Surgeon: RInda Castle MD;  Location: [facility];  Service: Endoscopy;  Laterality: N/A;  . Cataract extraction w/ intraocular lens  implant, bilateral  04/08/2006 & 04/22/2006  . Esophagogastroduodenoscopy  02/25/12    Atrophic gastritis with a few erosions--capsule endoscopy planned as of 02/25/12 (Dr. KDeatra Ina.  . 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 Hardware  removal Right 08/12/2012    Procedure: HARDWARE REMOVAL;  Surgeon: Mcarthur Rossetti, MD;  Location: [facility];  Service: Orthopedics;  Laterality: Right;    Outpatient Prescriptions Prior to Visit  Medication Sig Dispense Refill  . aspirin 81 MG tablet Take 81 mg by mouth daily.      . budesonide-formoterol (SYMBICORT) 160-4.5 MCG/ACT inhaler Inhale 2 puffs into the lungs 2 (two) times daily.  1 Inhaler  12  . buPROPion (WELLBUTRIN SR) 150 MG 12 hr tablet Take 1 tablet (150 mg total) by mouth 2 (two) times daily.  60 tablet  0  . chlorthalidone (HYGROTON) 25 MG tablet Take 1 tablet (25 mg total) by mouth daily.  30 tablet  10  .  Cholecalciferol (VITAMIN D-3) 1000 UNITS CAPS Take 1 capsule by mouth daily.      . Chromium Picolinate 1000 MCG TABS Take 1 tablet by mouth daily.      . ferrous sulfate 325 (65 FE) MG tablet Take 325 mg by mouth daily with breakfast.      . glimepiride (AMARYL) 4 MG tablet Take 1 tablet (4 mg total) by mouth 2 (two) times daily.  180 tablet  1  . levofloxacin (LEVAQUIN) 500 MG tablet Take 1 tablet (500 mg total) by mouth daily.  4 tablet  0  . lovastatin (MEVACOR) 40 MG tablet Take 1 tablet (40 mg total) by mouth 2 (two) times daily.  180 tablet  1  . metFORMIN (GLUCOPHAGE) 1000 MG tablet Take 1 tablet (1,000 mg total) by mouth 2 (two) times daily with a meal.  180 tablet  1  . Multiple Vitamin (MULTIVITAMIN WITH MINERALS) TABS Take 1 tablet by mouth daily.      . pioglitazone (ACTOS) 45 MG tablet Take 1 tablet (45 mg total) by mouth daily.  90 tablet  1  . predniSONE (DELTASONE) 20 MG tablet Take 3 tablets (60 mg total) by mouth daily with breakfast.  42 tablet  0  . ramipril (ALTACE) 10 MG capsule Take 1 capsule (10 mg total) by mouth 2 (two) times daily.  180 capsule  1  . Dapagliflozin Propanediol (FARXIGA) 5 MG TABS Take 5 mg by mouth daily.  30 tablet  5  . Liraglutide (VICTOZA) 18 MG/3ML SOPN Inject 1.8 mg into the skin daily.  9 mL  6   No facility-administered medications prior to visit.    Allergies  Allergen Reactions  . Demerol [Meperidine] Nausea Only  . Morphine And Related Other (See Comments)    Drenched with perspiration  . Starlix [Nateglinide] Other (See Comments)    gassy    ROS As per HPI  PE: Blood pressure 109/69, pulse 93, temperature 97.4 F (36.3 C), temperature source Temporal, resp. rate 18, height 5' 11" (1.803 m), weight 191 lb (86.637 kg), SpO2 94.00%. Gen: alert, A&OX 4, pleasant and tired appearing but NAD. WUJ:WJXB: no injection, icteris, swelling, or exudate.  EOMI, PERRLA. Mouth: lips without lesion/swelling.  Oral mucosa pink and moist.  Oropharynx without erythema, exudate, or swelling.  CV: RRR, no m/r/g.   LUNGS: CTA bilat, nonlabored resps, good aeration in all lung fields. EXT: no clubbing, cyanosis, or edema.   LABS:  Lab Results  Component Value Date   HGBA1C 10.3* 07/01/2013   Lab Results  Component Value Date   WBC 10.2 07/04/2013   HGB 13.4 07/04/2013   HCT 42.5 07/04/2013   MCV 87.4 07/04/2013   PLT 273 07/04/2013     Chemistry  Component Value Date/Time   NA 136* 07/04/2013 0325   K 5.1 07/04/2013 0325   CL 96 07/04/2013 0325   CO2 26 07/04/2013 0325   BUN 26* 07/04/2013 0325   CREATININE 0.64 07/04/2013 0325      Component Value Date/Time   CALCIUM 9.6 07/04/2013 0325   ALKPHOS 123* 07/04/2013 0325   AST 14 07/04/2013 0325   ALT 31 07/04/2013 0325   BILITOT 0.2* 07/04/2013 0325     Lab Results  Component Value Date   TSH 0.918 07/01/2013     IMPRESSION AND PLAN:  1) Pericardial effusion/pericarditis: continue prednisone 37m qd and keep f/u appt with cardiologist.  2) COPD exac: improving greatly.  Finish abx, continue symbicort and prn albuterol.  3) Poorly controlled DM 2: pt now agrees to start insulin: levimir 10U qhs and titrate 1-2 U q night to get to goal fasting gluc of 100-110.  Therapeutic expectations and side effect profile of medication discussed today.  Patient's questions answered. Stop victoza.  Continue oral diabetic meds.  4) HTN: stable.   FOLLOW UP: Return in about 7 days (around 07/13/2013) for f/u DM 2.  Out of work until then and will reassess ability to work the day of f/u.

## 2013-07-13 ENCOUNTER — Ambulatory Visit (INDEPENDENT_AMBULATORY_CARE_PROVIDER_SITE_OTHER): Payer: Medicare HMO | Admitting: Family Medicine

## 2013-07-13 ENCOUNTER — Encounter: Payer: Self-pay | Admitting: Family Medicine

## 2013-07-13 VITALS — BP 109/67 | HR 90 | Temp 97.7°F | Resp 18 | Ht 71.0 in | Wt 187.0 lb

## 2013-07-13 DIAGNOSIS — E1165 Type 2 diabetes mellitus with hyperglycemia: Principal | ICD-10-CM

## 2013-07-13 DIAGNOSIS — I3139 Other pericardial effusion (noninflammatory): Secondary | ICD-10-CM

## 2013-07-13 DIAGNOSIS — F17200 Nicotine dependence, unspecified, uncomplicated: Secondary | ICD-10-CM

## 2013-07-13 DIAGNOSIS — I319 Disease of pericardium, unspecified: Secondary | ICD-10-CM

## 2013-07-13 DIAGNOSIS — I313 Pericardial effusion (noninflammatory): Secondary | ICD-10-CM

## 2013-07-13 DIAGNOSIS — IMO0001 Reserved for inherently not codable concepts without codable children: Secondary | ICD-10-CM

## 2013-07-13 NOTE — Assessment & Plan Note (Addendum)
He is titrating levemir appropriately. Increase to 30 U qhs tonight, then resume titrating by 1-2 units a day until fasting gluc in range of 100-110. Continue actos, amaryl, and metformin. As he starts to get weened off the prednisone next week after seeing the cardiologist he may have need to slow his titration down and/or d/c his amaryl. He gave me a DMV form to fill out today--he'll not return to work until this is completed and his employer is satisfied that he is safe to drive again.

## 2013-07-13 NOTE — Assessment & Plan Note (Signed)
Presumed viral/inflammatory. Finishing prednisone 36m qd pretty soon, then will see cardiologist and hopefully begin to ween off prednisone. He continues to gain strength, has regular rhythm, and denies CP or SOB.

## 2013-07-13 NOTE — Progress Notes (Signed)
OFFICE NOTE  07/13/2013  CC:  Chief Complaint  Patient presents with  . Follow-up     HPI: Patient is a 73 y.o. Caucasian male who is here for 5d f/u after recently starting levemir for his DM 2. Energy is still slowly coming back. He has titrated his levemir up to 25 U now.  He is still on prednisone for his pericarditis--will be on this 6 more days and then will likely taper off of this. Fasting gluc still 260-300.  Occ check later in the day 2 H after meal it is 300.  Appetite is very good. He still has not smoke since being in hospital. Says breathing feels fine.  Pertinent PMH:  Past medical, surgical, social, and family history reviewed and no changes are noted since last office visit.  MEDS:  Outpatient Prescriptions Prior to Visit  Medication Sig Dispense Refill  . aspirin 81 MG tablet Take 81 mg by mouth daily.      . budesonide-formoterol (SYMBICORT) 160-4.5 MCG/ACT inhaler Inhale 2 puffs into the lungs 2 (two) times daily.  1 Inhaler  12  . buPROPion (WELLBUTRIN SR) 150 MG 12 hr tablet Take 1 tablet (150 mg total) by mouth 2 (two) times daily.  60 tablet  0  . chlorthalidone (HYGROTON) 25 MG tablet Take 1 tablet (25 mg total) by mouth daily.  30 tablet  10  . Cholecalciferol (VITAMIN D-3) 1000 UNITS CAPS Take 1 capsule by mouth daily.      . Chromium Picolinate 1000 MCG TABS Take 1 tablet by mouth daily.      . ferrous sulfate 325 (65 FE) MG tablet Take 325 mg by mouth daily with breakfast.      . glimepiride (AMARYL) 4 MG tablet Take 1 tablet (4 mg total) by mouth 2 (two) times daily.  180 tablet  1  . Insulin Detemir (LEVEMIR FLEXTOUCH) 100 UNIT/ML Pen 10 units SQ qhs--titrating according to MD instructions  15 mL  4  . levofloxacin (LEVAQUIN) 500 MG tablet Take 1 tablet (500 mg total) by mouth daily.  4 tablet  0  . lovastatin (MEVACOR) 40 MG tablet Take 1 tablet (40 mg total) by mouth 2 (two) times daily.  180 tablet  1  . metFORMIN (GLUCOPHAGE) 1000 MG tablet Take  1 tablet (1,000 mg total) by mouth 2 (two) times daily with a meal.  180 tablet  1  . Multiple Vitamin (MULTIVITAMIN WITH MINERALS) TABS Take 1 tablet by mouth daily.      . pioglitazone (ACTOS) 45 MG tablet Take 1 tablet (45 mg total) by mouth daily.  90 tablet  1  . predniSONE (DELTASONE) 20 MG tablet Take 3 tablets (60 mg total) by mouth daily with breakfast.  42 tablet  0  . ramipril (ALTACE) 10 MG capsule Take 1 capsule (10 mg total) by mouth 2 (two) times daily.  180 capsule  1   No facility-administered medications prior to visit.    PE: Blood pressure 109/67, pulse 90, temperature 97.7 F (36.5 C), temperature source Temporal, resp. rate 18, height _0  (1.803 m), weight 187 lb (84.823 kg), SpO2 95.00%. Gen: Alert, well appearing.  Patient is oriented to person, place, time, and situation. AFFECT: pleasant, lucid thought and speech. CV: RRR, no m/r/g.   LUNGS: CTA bilat, nonlabored resps, good aeration in all lung fields. EXT: no clubbing or cyanosis.  Trace pretibial/ankle edema bilat.   IMPRESSION AND PLAN:  Type II or unspecified type diabetes mellitus without mention  of complication, uncontrolled He is titrating levemir appropriately. Increase to 30 U qhs tonight, then resume titrating by 1-2 units a day until fasting gluc in range of 100-110. Continue actos, amaryl, and metformin. As he starts to get weened off the prednisone next week after seeing the cardiologist he may have need to slow his titration down and/or d/c his amaryl. He gave me a DMV form to fill out today--he'll not return to work until this is completed and his employer is satisfied that he is safe to drive again.  Tobacco dependence Currently in remission and doing ok.  Pericardial effusion Presumed viral/inflammatory. Finishing prednisone 25m qd pretty soon, then will see cardiologist and hopefully begin to ween off prednisone. He continues to gain strength, has regular rhythm, and denies CP or  SOB.   An After Visit Summary was printed and given to the patient.  FOLLOW UP: 1 week

## 2013-07-13 NOTE — Assessment & Plan Note (Signed)
Currently in remission and doing ok.

## 2013-07-13 NOTE — Progress Notes (Signed)
Pre visit review using our clinic review tool, if applicable. No additional management support is needed unless otherwise documented below in the visit note.

## 2013-07-13 NOTE — Patient Instructions (Signed)
Increase levemir to 30 U tonight, then continue to increase by 1-2 units every night until fasting glucose is in 100-110 range.

## 2013-07-17 ENCOUNTER — Ambulatory Visit (INDEPENDENT_AMBULATORY_CARE_PROVIDER_SITE_OTHER): Payer: Medicare HMO | Admitting: Interventional Cardiology

## 2013-07-17 ENCOUNTER — Encounter: Payer: Self-pay | Admitting: Interventional Cardiology

## 2013-07-17 VITALS — BP 131/71 | HR 89 | Ht 71.0 in | Wt 192.0 lb

## 2013-07-17 DIAGNOSIS — I313 Pericardial effusion (noninflammatory): Secondary | ICD-10-CM

## 2013-07-17 DIAGNOSIS — I319 Disease of pericardium, unspecified: Secondary | ICD-10-CM

## 2013-07-17 DIAGNOSIS — I3139 Other pericardial effusion (noninflammatory): Secondary | ICD-10-CM

## 2013-07-17 MED ORDER — PREDNISONE 10 MG PO TABS: ORAL_TABLET | ORAL | Status: DC

## 2013-07-17 NOTE — Progress Notes (Signed)
Patient ID: Joshua [name], male   DOB: 07/21/1940, 73 y.o.   MRN: [id number]    [address] [place], [place]  [address] Phone: [phone] Fax:  [phone]  Date:  07/17/2013   ID:  Joshua [name], DOB 05/30/1940, MRN [id number]  PCP:  MCGOWEN,PHILIP H, MD      History of Present Illness: [name] is a 73 y.o. male with a long history of tobacco abuse. He was hospitalized due to fatigue and found to have a large pericardial effusion. He underwent pericardiocentesis. 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 There is no evidence of malignancy on workup. He has been on 60 mg of prednisone since hospital discharge. He has not had any further recurrence of the back pain that he had initially a hospital presentation. His energy level is better but not back to where he wants to be. He denies any orthopnea or PND. His lower extremities are much less swollen now than they were before the pericardiocentesis. Overall, he feels better, but not back to baseline.  In the hospital, he underwent pericardiocentesis and there is obvious calcification of his coronary arteries. Cardiac catheterization was postponed due to avoiding any blood thinning medication. The thought was blood thinners may worsen a bloody pericardial effusion.  He denies any anginal symptoms. He just feels a generalized fatigue   Wt Readings from Last 3 Encounters:  07/17/13 192 lb (87.091 kg)  07/13/13 187 lb (84.823 kg)  07/06/13 191 lb (86.637 kg)     Past Medical History  Diagnosis Date  . DM type 2 (diabetes mellitus, type 2)     Poor control on max oral meds--pt eventually agreed to insulin therapy  . Hypertension   . Tobacco dependence     Prior failure to quit with wellbutrin.  100+ pack-yr history.  Quit for 1 mo to get hip replacement (1 pack/day)  . Obesity   . Hyperplastic colon polyp 2001  . Erectile dysfunction     Normal testosterone  . Adenomatous colon polyp 10/16/2011    Repeat  2018  . 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 pylori neg, no dysplasia or malignancy.  . Macular degeneration, dry     Mild, bilat (Optometrist, Dustan Martin at [facility] [place] in [place], )  . History of pneumonia     YRS AGO  . Iron deficiency anemia 2014    03/2012 capsule endoscopy showed 2 AVMs--likely responsible for his IDA--lifetime iron supp recommended + q6mo CBCs.  . 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 COPD (chronic obstructive pulmonary disease)     Spirometry  2004 borderline obstruction  . Hyperlipidemia   . CKD (chronic kidney disease), stage I   . Diabetic nephropathy     Elevated urine microalb/cr 03/2011  . Pericardial effusion with cardiac tamponade 6//29/15    Infectious/inflamm (cytology showed NO MALIGNANT CELLS)    Current Outpatient Prescriptions  Medication Sig Dispense Refill  . aspirin 81 MG tablet Take 81 mg by mouth daily.      . budesonide-formoterol (SYMBICORT) 160-4.5 MCG/ACT inhaler Inhale 2 puffs into the lungs 2 (two) times daily.  1 Inhaler  12  . buPROPion (WELLBUTRIN SR) 150 MG 12 hr tablet Take 1 tablet (150 mg total) by mouth 2 (two) times daily.  60 tablet  0  . chlorthalidone (HYGROTON) 25 MG tablet Take 1 tablet (25 mg total) by mouth daily.  30   tablet  10  . Cholecalciferol (VITAMIN D-3) 1000 UNITS CAPS Take 1 capsule by mouth daily.      . Chromium Picolinate 1000 MCG TABS Take 1 tablet by mouth daily.      . ferrous sulfate 325 (65 FE) MG tablet Take 325 mg by mouth daily with breakfast.      . glimepiride (AMARYL) 4 MG tablet Take 1 tablet (4 mg total) by mouth 2 (two) times daily.  180 tablet  1  . Insulin Detemir (LEVEMIR FLEXTOUCH) 100 UNIT/ML Pen 10 units SQ qhs--titrating according to MD instructions  15 mL  4  . lovastatin (MEVACOR) 40 MG tablet Take 1 tablet (40 mg total) by mouth 2 (two) times daily.  180 tablet  1  . metFORMIN (GLUCOPHAGE) 1000 MG tablet Take 1 tablet (1,000 mg  total) by mouth 2 (two) times daily with a meal.  180 tablet  1  . Multiple Vitamin (MULTIVITAMIN WITH MINERALS) TABS Take 1 tablet by mouth daily.      . pioglitazone (ACTOS) 45 MG tablet Take 1 tablet (45 mg total) by mouth daily.  90 tablet  1  . predniSONE (DELTASONE) 20 MG tablet Take 3 tablets (60 mg total) by mouth daily with breakfast.  42 tablet  0  . ramipril (ALTACE) 10 MG capsule Take 1 capsule (10 mg total) by mouth 2 (two) times daily.  180 capsule  1   No current facility-administered medications for this visit.    Allergies:    Allergies  Allergen Reactions  . Demerol [Meperidine] Nausea Only  . Morphine And Related Other (See Comments)    Drenched with perspiration  . Starlix [Nateglinide] Other (See Comments)    gassy    Social History:  The patient  reports that he has been smoking Cigarettes.  He has a 60 pack-year smoking history. He uses smokeless tobacco. He reports that he does not drink alcohol or use illicit drugs.   Family History:  The patient's family history includes Breast cancer in his sister; CVA in his mother; Diabetes in his maternal uncle and paternal grandmother; Heart disease in his father and maternal uncle.   ROS:  Please see the history of present illness.  No nausea, vomiting.  No fevers, chills.  No focal weakness.  No dysuria.   All other systems reviewed and negative.   PHYSICAL EXAM: VS:  BP 131/71  Pulse 89  Ht 5' 11" (1.803 m)  Wt 192 lb (87.091 kg)  BMI 26.79 kg/m2 Well nourished, well developed, in no acute distress HEENT: normal Neck: no JVD, no carotid bruits Cardiac:  normal S1, S2; RRR;  Lungs:  clear to auscultation bilaterally, no wheezing, rhonchi or rales Abd: soft, nontender, no hepatomegaly Ext: no edema Skin: warm and dry Neuro:   no focal abnormalities noted     ASSESSMENT AND PLAN:  1. Pericarditis: Will taper prednisone. We'll have him decrease to 40 mg a day for 5 days. Then will decrease to 20 mg a day for  5 days. He will then decrease to 10 mg for 5 days and then 5 mg for 5 days. He'll then stop the prednisone. We'll check an echocardiogram in about 2 weeks to make sure that he is not starting to reaccumulate fluid. He was given steroids before the pericardiocentesis do to a presumed COPD exacerbation. Therefore, we never tried nonsteroidal anti-inflammatories.  If he has a recurrence, I would try NSAIDs and colchicine. 2. Coronary calcification: When he comes back in   about 4 weeks, if his pericarditis has resolved, we will schedule diagnostic cardiac cath at that time. 3. He has stopped smoking since leaving the hospital. Continue to abstain from cigarettes. 4. Pleural disease noted on CT scan.  Signed, Mina Marble, MD, [facility] 07/17/2013 10:05 AM

## 2013-07-17 NOTE — Patient Instructions (Addendum)
Your physician has requested that you have an LIMITED Echocardiogram in 2 weeks. Echocardiography is a painless test that uses sound waves to create images of your heart. It provides your doctor with information about the size and shape of your heart and how well your heart's chambers and valves are working. This procedure takes approximately one hour. There are no restrictions for this procedure.  Your physician has recommended you make the following change in your medication:   1. Start Prednisone 6m - take 40 mg for 5 days, 20 mg for 5 days, 10 mg for 5 days, 5 mg for 5 days, then stop.  Your physician recommends that you schedule a follow-up appointment in: 4 weeks with Dr. VIrish Lack

## 2013-07-21 ENCOUNTER — Ambulatory Visit (INDEPENDENT_AMBULATORY_CARE_PROVIDER_SITE_OTHER): Payer: Medicare HMO | Admitting: Family Medicine

## 2013-07-21 ENCOUNTER — Encounter: Payer: Self-pay | Admitting: Family Medicine

## 2013-07-21 VITALS — BP 131/68 | HR 88 | Temp 97.8°F | Resp 18 | Ht 71.0 in | Wt 190.0 lb

## 2013-07-21 DIAGNOSIS — E1165 Type 2 diabetes mellitus with hyperglycemia: Principal | ICD-10-CM

## 2013-07-21 DIAGNOSIS — I313 Pericardial effusion (noninflammatory): Secondary | ICD-10-CM

## 2013-07-21 DIAGNOSIS — I3139 Other pericardial effusion (noninflammatory): Secondary | ICD-10-CM

## 2013-07-21 DIAGNOSIS — IMO0001 Reserved for inherently not codable concepts without codable children: Secondary | ICD-10-CM

## 2013-07-21 DIAGNOSIS — I319 Disease of pericardium, unspecified: Secondary | ICD-10-CM

## 2013-07-21 MED ORDER — INSULIN DETEMIR 100 UNIT/ML FLEXPEN: PEN_INJECTOR | SUBCUTANEOUS | Status: DC

## 2013-07-21 NOTE — Assessment & Plan Note (Signed)
Inflamm pericarditis. Weening off prednisone as per cardiology recommendations. As per cardiology, if signs of recurrence then they recommend trial of NSAID or colchicine.

## 2013-07-21 NOTE — Progress Notes (Signed)
OFFICE NOTE  07/21/2013  CC:  Chief Complaint  Patient presents with  . Follow-up   HPI: Patient is a 73 y.o. Caucasian male who is here for 1 week f/u: recovering from inflammatory pericarditis and COPD flare, getting insulin titrated up. Saw cardiologist and has started to taper off of prednisone, now on 10 mg qd. Glucoses gradually decreasing, has titrated levemir to 50 U qhs and this morning's gluc was 108. Says breathing feeling fine.  Energy level slowly returning but admits he is not ready to return to work.  Pertinent PMH:  Past surgical, social, and family history reviewed and no changes noted since last office visit.  MEDS:  Outpatient Prescriptions Prior to Visit  Medication Sig Dispense Refill  . aspirin 81 MG tablet Take 81 mg by mouth daily.      . budesonide-formoterol (SYMBICORT) 160-4.5 MCG/ACT inhaler Inhale 2 puffs into the lungs 2 (two) times daily.  1 Inhaler  12  . buPROPion (WELLBUTRIN SR) 150 MG 12 hr tablet Take 1 tablet (150 mg total) by mouth 2 (two) times daily.  60 tablet  0  . chlorthalidone (HYGROTON) 25 MG tablet Take 1 tablet (25 mg total) by mouth daily.  30 tablet  10  . Cholecalciferol (VITAMIN D-3) 1000 UNITS CAPS Take 1 capsule by mouth daily.      . Chromium Picolinate 1000 MCG TABS Take 1 tablet by mouth daily.      . ferrous sulfate 325 (65 FE) MG tablet Take 325 mg by mouth daily with breakfast.      . glimepiride (AMARYL) 4 MG tablet Take 1 tablet (4 mg total) by mouth 2 (two) times daily.  180 tablet  1  . Insulin Detemir (LEVEMIR FLEXTOUCH) 100 UNIT/ML Pen 10 units SQ qhs--titrating according to MD instructions  15 mL  4  . lovastatin (MEVACOR) 40 MG tablet Take 1 tablet (40 mg total) by mouth 2 (two) times daily.  180 tablet  1  . metFORMIN (GLUCOPHAGE) 1000 MG tablet Take 1 tablet (1,000 mg total) by mouth 2 (two) times daily with a meal.  180 tablet  1  . Multiple Vitamin (MULTIVITAMIN WITH MINERALS) TABS Take 1 tablet by mouth daily.       . pioglitazone (ACTOS) 45 MG tablet Take 1 tablet (45 mg total) by mouth daily.  90 tablet  1  . predniSONE (DELTASONE) 10 MG tablet 40 mg for 5 days, 20 mg for 5 days, 10 mg for 5 days and 5 mg for 5 days then stop  38 tablet  0  . predniSONE (DELTASONE) 20 MG tablet Take 3 tablets (60 mg total) by mouth daily with breakfast.  42 tablet  0  . ramipril (ALTACE) 10 MG capsule Take 1 capsule (10 mg total) by mouth 2 (two) times daily.  180 capsule  1   No facility-administered medications prior to visit.    PE: Blood pressure 131/68, pulse 88, temperature 97.8 F (36.6 C), temperature source Temporal, resp. rate 18, height _0  (1.803 m), weight 190 lb (86.183 kg), SpO2 97.00%. Gen: Alert, well appearing.  Patient is oriented to person, place, time, and situation. CV: RRR, no m/r/g.   LUNGS: CTA bilat, nonlabored resps, good aeration in all lung fields. EXT: trace bilat pitting edema.  LABS; none today  IMPRESSION AND PLAN:  Type II or unspecified type diabetes mellitus without mention of complication, uncontrolled Fasting glucose much improved. Hold at current insulin dosing for now but he knows how to  gradually titrate if needed based on fasting glucose range. Continue actos, amaryl, and metformin.  Pericardial effusion Inflamm pericarditis. Weening off prednisone as per cardiology recommendations. As per cardiology, if signs of recurrence then they recommend trial of NSAID or colchicine.  An After Visit Summary was printed and given to the patient.  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

## 2013-07-21 NOTE — Progress Notes (Signed)
Pre visit review using our clinic review tool, if applicable. No additional management support is needed unless otherwise documented below in the visit note.

## 2013-07-21 NOTE — Assessment & Plan Note (Signed)
Fasting glucose much improved. Hold at current insulin dosing for now but he knows how to gradually titrate if needed based on fasting glucose range. Continue actos, amaryl, and metformin.

## 2013-07-28 ENCOUNTER — Telehealth: Payer: Self-pay | Admitting: Interventional Cardiology

## 2013-07-28 ENCOUNTER — Ambulatory Visit (HOSPITAL_COMMUNITY): Payer: Medicare HMO | Attending: Internal Medicine | Admitting: Radiology

## 2013-07-28 DIAGNOSIS — Z87891 Personal history of nicotine dependence: Secondary | ICD-10-CM | POA: Insufficient documentation

## 2013-07-28 DIAGNOSIS — J449 Chronic obstructive pulmonary disease, unspecified: Secondary | ICD-10-CM | POA: Insufficient documentation

## 2013-07-28 DIAGNOSIS — E785 Hyperlipidemia, unspecified: Secondary | ICD-10-CM | POA: Diagnosis not present

## 2013-07-28 DIAGNOSIS — E119 Type 2 diabetes mellitus without complications: Secondary | ICD-10-CM | POA: Diagnosis not present

## 2013-07-28 DIAGNOSIS — I4891 Unspecified atrial fibrillation: Secondary | ICD-10-CM | POA: Insufficient documentation

## 2013-07-28 DIAGNOSIS — I319 Disease of pericardium, unspecified: Secondary | ICD-10-CM | POA: Diagnosis present

## 2013-07-28 DIAGNOSIS — J4489 Other specified chronic obstructive pulmonary disease: Secondary | ICD-10-CM | POA: Insufficient documentation

## 2013-07-28 DIAGNOSIS — I313 Pericardial effusion (noninflammatory): Secondary | ICD-10-CM

## 2013-07-28 DIAGNOSIS — I10 Essential (primary) hypertension: Secondary | ICD-10-CM | POA: Insufficient documentation

## 2013-07-28 DIAGNOSIS — I3139 Other pericardial effusion (noninflammatory): Secondary | ICD-10-CM

## 2013-07-28 NOTE — Progress Notes (Signed)
Limited Echocardiogram performed.

## 2013-07-28 NOTE — Telephone Encounter (Signed)
Walk In pt form " [employer]" paper Dropped Off gave to Amy 7.24.1/km

## 2013-07-31 ENCOUNTER — Telehealth: Payer: Self-pay | Admitting: Interventional Cardiology

## 2013-07-31 DIAGNOSIS — I2510 Atherosclerotic heart disease of native coronary artery without angina pectoris: Secondary | ICD-10-CM

## 2013-07-31 NOTE — Telephone Encounter (Signed)
New problem:    Per pt he got real heat and sweating around this time  Sun. 26th July.   Pt would like a call back about this please.

## 2013-07-31 NOTE — Telephone Encounter (Addendum)
Spoke with pt and he had an episode yesterday where he became hot, sweaty, nauseated (with indigestion) and felt as if me might pass out. Pt was in the kitchen fixing lunch and he went to lay down. The episode passed after about 20-30 minutes and he rested the rest of the day. Pt took his sugar and it was 168. Pt didn't check his BP or heart rate. This is the first episode pt has had like this. Pt states he feels "fair" today.

## 2013-08-01 NOTE — Telephone Encounter (Signed)
Spoke with pt and his last day of prednisone will be this Thursday. Pt agreeable to heart cath. Can schedule for 8/4 or 8/6.

## 2013-08-01 NOTE — Telephone Encounter (Signed)
lmtrc

## 2013-08-01 NOTE — Telephone Encounter (Signed)
Ok to schedule heart cath for next week.  Stay hydrated. How much longer will he be on Prednisone?

## 2013-08-02 ENCOUNTER — Encounter: Payer: Self-pay | Admitting: Cardiology

## 2013-08-02 ENCOUNTER — Other Ambulatory Visit: Payer: Self-pay | Admitting: Interventional Cardiology

## 2013-08-02 DIAGNOSIS — R072 Precordial pain: Secondary | ICD-10-CM

## 2013-08-02 NOTE — Telephone Encounter (Signed)
Labs ordered and pt requests to have labs drawn at Dr. Idelle Leech office tomorrow.

## 2013-08-02 NOTE — Telephone Encounter (Signed)
[facility] for 8/4

## 2013-08-02 NOTE — Telephone Encounter (Signed)
Heart Cath scheduled for 08/08/13. I will check with Dr. Irish Lack to see if he is okay with this day.

## 2013-08-02 NOTE — Telephone Encounter (Signed)
Pt notified that cath is scheduled for 08/08/13 at 9 am with arrival time at 7 am. Instructions given.

## 2013-08-03 ENCOUNTER — Encounter (HOSPITAL_COMMUNITY): Payer: Self-pay | Admitting: Pharmacy Technician

## 2013-08-04 ENCOUNTER — Other Ambulatory Visit: Payer: Commercial Managed Care - HMO

## 2013-08-04 DIAGNOSIS — I2510 Atherosclerotic heart disease of native coronary artery without angina pectoris: Secondary | ICD-10-CM

## 2013-08-04 LAB — PROTIME-INR
INR: 0.9 ratio (ref 0.8–1.0)
Prothrombin Time: 9.9 s (ref 9.6–13.1)

## 2013-08-04 LAB — BASIC METABOLIC PANEL
BUN: 22 mg/dL (ref 6–23)
CALCIUM: 9.2 mg/dL (ref 8.4–10.5)
CO2: 29 mEq/L (ref 19–32)
Chloride: 95 mEq/L — ABNORMAL LOW (ref 96–112)
Creatinine, Ser: 0.8 mg/dL (ref 0.4–1.5)
GFR: 104.88 mL/min (ref >60.00)
Glucose, Bld: 323 mg/dL — ABNORMAL HIGH (ref 70–99)
Potassium: 5.1 mEq/L (ref 3.5–5.1)
Sodium: 134 mEq/L — ABNORMAL LOW (ref 135–145)

## 2013-08-04 LAB — CBC
HEMATOCRIT: 45 % (ref 39.0–52.0)
Hemoglobin: 14.6 g/dL (ref 13.0–17.0)
MCHC: 32.4 g/dL (ref 30.0–36.0)
MCV: 86.8 fl (ref 78.0–100.0)
Platelets: 146 10*3/uL — ABNORMAL LOW (ref 150.0–400.0)
RBC: 5.19 Mil/uL (ref 4.22–5.81)
RDW: 17 % — AB (ref 11.5–15.5)
WBC: 9.6 10*3/uL (ref 4.0–10.5)

## 2013-08-08 ENCOUNTER — Inpatient Hospital Stay (HOSPITAL_COMMUNITY)
Admission: RE | Admit: 2013-08-08 | Discharge: 2013-08-10 | DRG: 287 | Disposition: A | Discharge status: Home / Self Care | Payer: Medicare HMO | Source: Ambulatory Visit | Attending: Interventional Cardiology | Admitting: Interventional Cardiology

## 2013-08-08 ENCOUNTER — Other Ambulatory Visit: Payer: Self-pay

## 2013-08-08 ENCOUNTER — Encounter (HOSPITAL_COMMUNITY): Payer: Self-pay | Admitting: Interventional Cardiology

## 2013-08-08 ENCOUNTER — Encounter (HOSPITAL_COMMUNITY)
Admission: RE | Disposition: A | Discharge status: Home / Self Care | Payer: Self-pay | Source: Ambulatory Visit | Attending: Interventional Cardiology

## 2013-08-08 DIAGNOSIS — J432 Centrilobular emphysema: Secondary | ICD-10-CM

## 2013-08-08 DIAGNOSIS — Z96649 Presence of unspecified artificial hip joint: Secondary | ICD-10-CM | POA: Diagnosis not present

## 2013-08-08 DIAGNOSIS — Z7982 Long term (current) use of aspirin: Secondary | ICD-10-CM

## 2013-08-08 DIAGNOSIS — I313 Pericardial effusion (noninflammatory): Secondary | ICD-10-CM | POA: Diagnosis present

## 2013-08-08 DIAGNOSIS — N058 Unspecified nephritic syndrome with other morphologic changes: Secondary | ICD-10-CM | POA: Diagnosis present

## 2013-08-08 DIAGNOSIS — N183 Chronic kidney disease, stage 3 unspecified: Secondary | ICD-10-CM | POA: Diagnosis present

## 2013-08-08 DIAGNOSIS — I319 Disease of pericardium, unspecified: Secondary | ICD-10-CM | POA: Diagnosis present

## 2013-08-08 DIAGNOSIS — I2584 Coronary atherosclerosis due to calcified coronary lesion: Secondary | ICD-10-CM | POA: Diagnosis present

## 2013-08-08 DIAGNOSIS — R0600 Dyspnea, unspecified: Secondary | ICD-10-CM | POA: Diagnosis present

## 2013-08-08 DIAGNOSIS — Z9849 Cataract extraction status, unspecified eye: Secondary | ICD-10-CM

## 2013-08-08 DIAGNOSIS — Z9089 Acquired absence of other organs: Secondary | ICD-10-CM | POA: Diagnosis not present

## 2013-08-08 DIAGNOSIS — I4891 Unspecified atrial fibrillation: Secondary | ICD-10-CM | POA: Diagnosis present

## 2013-08-08 DIAGNOSIS — E1165 Type 2 diabetes mellitus with hyperglycemia: Secondary | ICD-10-CM | POA: Diagnosis present

## 2013-08-08 DIAGNOSIS — E1129 Type 2 diabetes mellitus with other diabetic kidney complication: Secondary | ICD-10-CM | POA: Diagnosis present

## 2013-08-08 DIAGNOSIS — I2582 Chronic total occlusion of coronary artery: Secondary | ICD-10-CM | POA: Diagnosis present

## 2013-08-08 DIAGNOSIS — Z833 Family history of diabetes mellitus: Secondary | ICD-10-CM | POA: Diagnosis not present

## 2013-08-08 DIAGNOSIS — R5383 Other fatigue: Secondary | ICD-10-CM | POA: Diagnosis present

## 2013-08-08 DIAGNOSIS — Z6826 Body mass index (BMI) 26.0-26.9, adult: Secondary | ICD-10-CM | POA: Diagnosis not present

## 2013-08-08 DIAGNOSIS — I2510 Atherosclerotic heart disease of native coronary artery without angina pectoris: Secondary | ICD-10-CM | POA: Diagnosis present

## 2013-08-08 DIAGNOSIS — Z885 Allergy status to narcotic agent status: Secondary | ICD-10-CM

## 2013-08-08 DIAGNOSIS — R0609 Other forms of dyspnea: Secondary | ICD-10-CM

## 2013-08-08 DIAGNOSIS — I314 Cardiac tamponade: Secondary | ICD-10-CM

## 2013-08-08 DIAGNOSIS — Z79899 Other long term (current) drug therapy: Secondary | ICD-10-CM | POA: Diagnosis not present

## 2013-08-08 DIAGNOSIS — K2940 Chronic atrophic gastritis without bleeding: Secondary | ICD-10-CM | POA: Diagnosis present

## 2013-08-08 DIAGNOSIS — I10 Essential (primary) hypertension: Secondary | ICD-10-CM | POA: Diagnosis present

## 2013-08-08 DIAGNOSIS — E669 Obesity, unspecified: Secondary | ICD-10-CM | POA: Diagnosis present

## 2013-08-08 DIAGNOSIS — Z860100 Personal history of colon polyps, unspecified: Secondary | ICD-10-CM

## 2013-08-08 DIAGNOSIS — J449 Chronic obstructive pulmonary disease, unspecified: Secondary | ICD-10-CM | POA: Diagnosis present

## 2013-08-08 DIAGNOSIS — N181 Chronic kidney disease, stage 1: Secondary | ICD-10-CM | POA: Diagnosis present

## 2013-08-08 DIAGNOSIS — Z8249 Family history of ischemic heart disease and other diseases of the circulatory system: Secondary | ICD-10-CM

## 2013-08-08 DIAGNOSIS — I129 Hypertensive chronic kidney disease with stage 1 through stage 4 chronic kidney disease, or unspecified chronic kidney disease: Secondary | ICD-10-CM | POA: Diagnosis present

## 2013-08-08 DIAGNOSIS — Z803 Family history of malignant neoplasm of breast: Secondary | ICD-10-CM

## 2013-08-08 DIAGNOSIS — I209 Angina pectoris, unspecified: Secondary | ICD-10-CM

## 2013-08-08 DIAGNOSIS — H353190 Nonexudative age-related macular degeneration, unspecified eye, stage unspecified: Secondary | ICD-10-CM | POA: Diagnosis present

## 2013-08-08 DIAGNOSIS — IMO0001 Reserved for inherently not codable concepts without codable children: Secondary | ICD-10-CM | POA: Diagnosis present

## 2013-08-08 DIAGNOSIS — F17200 Nicotine dependence, unspecified, uncomplicated: Secondary | ICD-10-CM | POA: Diagnosis present

## 2013-08-08 DIAGNOSIS — E785 Hyperlipidemia, unspecified: Secondary | ICD-10-CM | POA: Diagnosis present

## 2013-08-08 DIAGNOSIS — Z823 Family history of stroke: Secondary | ICD-10-CM

## 2013-08-08 DIAGNOSIS — R072 Precordial pain: Secondary | ICD-10-CM

## 2013-08-08 DIAGNOSIS — J4489 Other specified chronic obstructive pulmonary disease: Secondary | ICD-10-CM | POA: Diagnosis present

## 2013-08-08 DIAGNOSIS — Z961 Presence of intraocular lens: Secondary | ICD-10-CM | POA: Diagnosis not present

## 2013-08-08 DIAGNOSIS — Z888 Allergy status to other drugs, medicaments and biological substances status: Secondary | ICD-10-CM | POA: Diagnosis not present

## 2013-08-08 DIAGNOSIS — R5381 Other malaise: Secondary | ICD-10-CM | POA: Diagnosis present

## 2013-08-08 DIAGNOSIS — I480 Paroxysmal atrial fibrillation: Secondary | ICD-10-CM | POA: Diagnosis present

## 2013-08-08 HISTORY — DX: Pneumonia, unspecified organism: J18.9

## 2013-08-08 HISTORY — PX: LEFT HEART CATHETERIZATION WITH CORONARY ANGIOGRAM: SHX5451

## 2013-08-08 HISTORY — PX: CARDIAC CATHETERIZATION: SHX172

## 2013-08-08 HISTORY — DX: Angina pectoris, unspecified: I20.9

## 2013-08-08 LAB — GLUCOSE, CAPILLARY
GLUCOSE-CAPILLARY: 81 mg/dL (ref 70–99)
Glucose-Capillary: 65 mg/dL — ABNORMAL LOW (ref 70–99)
Glucose-Capillary: 148 mg/dL — ABNORMAL HIGH (ref 70–99)
Glucose-Capillary: 346 mg/dL — ABNORMAL HIGH (ref 70–99)
Glucose-Capillary: 415 mg/dL — ABNORMAL HIGH (ref 70–99)
Glucose-Capillary: 379 mg/dL — ABNORMAL HIGH (ref 70–99)

## 2013-08-08 LAB — GLUCOSE, RANDOM: Glucose, Bld: 404 mg/dL — ABNORMAL HIGH (ref 70–99)

## 2013-08-08 SURGERY — LEFT HEART CATHETERIZATION WITH CORONARY ANGIOGRAM
Anesthesia: LOCAL

## 2013-08-08 MED ORDER — SODIUM CHLORIDE 0.9 % IJ SOLN: 3.0000 mL | Freq: Two times a day (BID) | INTRAMUSCULAR | Status: DC

## 2013-08-08 MED ORDER — ACETAMINOPHEN 325 MG PO TABS: 650.0000 mg | ORAL_TABLET | ORAL | Status: DC | PRN

## 2013-08-08 MED ORDER — FENTANYL CITRATE 0.05 MG/ML IJ SOLN
INTRAMUSCULAR | Status: AC
  Filled 2013-08-08: qty 2

## 2013-08-08 MED ORDER — SODIUM CHLORIDE 0.9 % IV SOLN
1.0000 mL/kg/h | INTRAVENOUS | Status: AC
Start: 2013-08-08 — End: 2013-08-08

## 2013-08-08 MED ORDER — VERAPAMIL HCL 2.5 MG/ML IV SOLN
INTRAVENOUS | Status: AC
  Filled 2013-08-08: qty 2

## 2013-08-08 MED ORDER — ASPIRIN 81 MG PO CHEW
CHEWABLE_TABLET | ORAL | Status: AC
  Filled 2013-08-08: qty 1

## 2013-08-08 MED ORDER — LIDOCAINE HCL (PF) 1 % IJ SOLN
INTRAMUSCULAR | Status: AC
  Filled 2013-08-08: qty 30

## 2013-08-08 MED ORDER — PIOGLITAZONE HCL 45 MG PO TABS
45.0000 mg | ORAL_TABLET | Freq: Every day | ORAL | Status: DC
  Administered 2013-08-08 – 2013-08-10 (×3): 45 mg via ORAL
  Filled 2013-08-08 (×3): qty 1

## 2013-08-08 MED ORDER — NICOTINE 7 MG/24HR TD PT24
7.0000 mg | MEDICATED_PATCH | TRANSDERMAL | Status: DC
  Filled 2013-08-08 (×2): qty 1

## 2013-08-08 MED ORDER — SIMVASTATIN 20 MG PO TABS
20.0000 mg | ORAL_TABLET | Freq: Every day | ORAL | Status: DC
Start: 2013-08-09 — End: 2013-08-10
  Administered 2013-08-09: 20 mg via ORAL
  Filled 2013-08-08 (×2): qty 1

## 2013-08-08 MED ORDER — ASPIRIN 81 MG PO TABS: 81.0000 mg | ORAL_TABLET | Freq: Every day | ORAL | Status: DC

## 2013-08-08 MED ORDER — ASPIRIN 81 MG PO CHEW
81.0000 mg | CHEWABLE_TABLET | ORAL | Status: AC
  Administered 2013-08-08: 81 mg via ORAL

## 2013-08-08 MED ORDER — INSULIN ASPART 100 UNIT/ML ~~LOC~~ SOLN
0.0000 [IU] | Freq: Three times a day (TID) | SUBCUTANEOUS | Status: DC
  Administered 2013-08-09: 19:00:00 3 [IU] via SUBCUTANEOUS
  Administered 2013-08-09: 14:00:00 5 [IU] via SUBCUTANEOUS
  Administered 2013-08-10: 13:00:00 3 [IU] via SUBCUTANEOUS

## 2013-08-08 MED ORDER — DEXTROSE 50 % IV SOLN
INTRAVENOUS | Status: AC
  Filled 2013-08-08: qty 50

## 2013-08-08 MED ORDER — GLIMEPIRIDE 4 MG PO TABS
4.0000 mg | ORAL_TABLET | Freq: Two times a day (BID) | ORAL | Status: DC
  Administered 2013-08-08 – 2013-08-10 (×4): 4 mg via ORAL
  Filled 2013-08-08 (×6): qty 1

## 2013-08-08 MED ORDER — SODIUM CHLORIDE 0.9 % IJ SOLN: 3.0000 mL | INTRAMUSCULAR | Status: DC | PRN

## 2013-08-08 MED ORDER — HEPARIN (PORCINE) IN NACL 2-0.9 UNIT/ML-% IJ SOLN
INTRAMUSCULAR | Status: AC
  Filled 2013-08-08: qty 1000

## 2013-08-08 MED ORDER — ADULT MULTIVITAMIN W/MINERALS CH
1.0000 | ORAL_TABLET | Freq: Every day | ORAL | Status: DC
  Administered 2013-08-08 – 2013-08-10 (×3): 1 via ORAL
  Filled 2013-08-08 (×3): qty 1

## 2013-08-08 MED ORDER — SODIUM CHLORIDE 0.9 % IV SOLN: 250.0000 mL | INTRAVENOUS | Status: DC | PRN

## 2013-08-08 MED ORDER — RAMIPRIL 10 MG PO CAPS
10.0000 mg | ORAL_CAPSULE | Freq: Two times a day (BID) | ORAL | Status: DC
  Administered 2013-08-08 – 2013-08-10 (×4): 10 mg via ORAL
  Filled 2013-08-08 (×5): qty 1

## 2013-08-08 MED ORDER — POLYETHYLENE GLYCOL 3350 17 G PO PACK
17.0000 g | PACK | Freq: Every day | ORAL | Status: DC | PRN
  Administered 2013-08-08: 22:00:00 17 g via ORAL
  Filled 2013-08-08: qty 1

## 2013-08-08 MED ORDER — MIDAZOLAM HCL 2 MG/2ML IJ SOLN
INTRAMUSCULAR | Status: AC
  Filled 2013-08-08: qty 2

## 2013-08-08 MED ORDER — ONDANSETRON HCL 4 MG/2ML IJ SOLN: 4.0000 mg | Freq: Four times a day (QID) | INTRAMUSCULAR | Status: DC | PRN

## 2013-08-08 MED ORDER — BUDESONIDE-FORMOTEROL FUMARATE 160-4.5 MCG/ACT IN AERO
2.0000 | INHALATION_SPRAY | Freq: Two times a day (BID) | RESPIRATORY_TRACT | Status: DC
  Administered 2013-08-08 – 2013-08-09 (×3): 2 via RESPIRATORY_TRACT
  Filled 2013-08-08 (×2): qty 6

## 2013-08-08 MED ORDER — SODIUM CHLORIDE 0.9 % IV SOLN
INTRAVENOUS | Status: DC
  Administered 2013-08-08: 08:00:00 via INTRAVENOUS

## 2013-08-08 MED ORDER — BUPROPION HCL ER (SR) 150 MG PO TB12
150.0000 mg | ORAL_TABLET | Freq: Every day | ORAL | Status: DC
  Administered 2013-08-09 – 2013-08-10 (×2): 150 mg via ORAL
  Filled 2013-08-08 (×2): qty 1

## 2013-08-08 MED ORDER — INSULIN DETEMIR 100 UNIT/ML ~~LOC~~ SOLN
39.0000 [IU] | Freq: Every day | SUBCUTANEOUS | Status: DC
  Administered 2013-08-08 – 2013-08-09 (×2): 39 [IU] via SUBCUTANEOUS
  Filled 2013-08-08 (×3): qty 0.39

## 2013-08-08 MED ORDER — NITROGLYCERIN 1 MG/10 ML FOR IR/CATH LAB
INTRA_ARTERIAL | Status: AC
  Filled 2013-08-08: qty 10

## 2013-08-08 MED ORDER — FERROUS SULFATE 325 (65 FE) MG PO TABS
325.0000 mg | ORAL_TABLET | Freq: Every day | ORAL | Status: DC
  Administered 2013-08-09 – 2013-08-10 (×2): 325 mg via ORAL
  Filled 2013-08-08 (×3): qty 1

## 2013-08-08 MED ORDER — DOCUSATE SODIUM 100 MG PO CAPS
100.0000 mg | ORAL_CAPSULE | Freq: Every day | ORAL | Status: DC | PRN
  Administered 2013-08-08: 100 mg via ORAL
  Filled 2013-08-08: qty 1

## 2013-08-08 MED ORDER — DEXTROSE 50 % IV SOLN
25.0000 mL | Freq: Once | INTRAVENOUS | Status: AC | PRN
  Administered 2013-08-08: 25 mL via INTRAVENOUS

## 2013-08-08 MED ORDER — HEPARIN SODIUM (PORCINE) 1000 UNIT/ML IJ SOLN
INTRAMUSCULAR | Status: AC
  Filled 2013-08-08: qty 1

## 2013-08-08 MED ORDER — ASPIRIN 81 MG PO CHEW
81.0000 mg | CHEWABLE_TABLET | Freq: Every day | ORAL | Status: DC
  Administered 2013-08-09 – 2013-08-10 (×2): 81 mg via ORAL
  Filled 2013-08-08 (×2): qty 1

## 2013-08-08 NOTE — H&P (View-Only) (Signed)
Patient ID: Joshua [name], male   DOB: 08-08-1940, 73 y.o.   MRN: [id number]    [place], [name], [place]  [address] Phone: [phone] Fax:  [phone]  Date:  07/17/2013   ID:  Joshua, [name] [birth year], MRN [id number]  PCP:  Tammi Sou, MD      History of Present Illness: Joshua [name] is a 73 y.o. male with a long history of tobacco abuse. He was hospitalized due to fatigue and found to have a large pericardial effusion. He underwent pericardiocentesis. 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 There is no evidence of malignancy on workup. He has been on 60 mg of prednisone since hospital discharge. He has not had any further recurrence of the back pain that he had initially a hospital presentation. His energy level is better but not back to where he wants to be. He denies any orthopnea or PND. His lower extremities are much less swollen now than they were before the pericardiocentesis. Overall, he feels better, but not back to baseline.  In the hospital, he underwent pericardiocentesis and there is obvious calcification of his coronary arteries. Cardiac catheterization was postponed due to avoiding any blood thinning medication. The thought was blood thinners may worsen a bloody pericardial effusion.  He denies any anginal symptoms. He just feels a generalized fatigue   Wt Readings from Last 3 Encounters:  07/17/13 192 lb (87.091 kg)  07/13/13 187 lb (84.823 kg)  07/06/13 191 lb (86.637 kg)     Past Medical History  Diagnosis Date  . DM type 2 (diabetes mellitus, type 2)     Poor control on max oral meds--pt eventually agreed to insulin therapy  . Hypertension   . Tobacco dependence     Prior failure to quit with wellbutrin.  100+ pack-yr history.  Quit for 1 mo to get hip replacement (1 pack/day)  . Obesity   . Hyperplastic colon polyp 2001  . Erectile dysfunction     Normal testosterone  . Adenomatous colon polyp 10/16/2011    Repeat  2018  . 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 pylori neg, no dysplasia or malignancy.  . Macular degeneration, dry     Mild, bilat (Optometrist, May[place] Knife at [facility] [facility] [place] in [place], [place])  . History of pneumonia     YRS AGO  . Iron deficiency anemia 2014    03/2012 capsule endoscopy showed 2 AVMs--likely responsible for his IDA--lifetime iron supp recommended + q27moCBCs.  . 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 COPD (chronic obstructive pulmonary disease)     Spirometry  2004 borderline obstruction  . Hyperlipidemia   . CKD (chronic kidney disease), stage I   . Diabetic nephropathy     Elevated urine microalb/cr 03/2011  . Pericardial effusion with cardiac tamponade 6//29/15    Infectious/inflamm (cytology showed NO MALIGNANT CELLS)    Current Outpatient Prescriptions  Medication Sig Dispense Refill  . aspirin 81 MG tablet Take 81 mg by mouth daily.      . budesonide-formoterol (SYMBICORT) 160-4.5 MCG/ACT inhaler Inhale 2 puffs into the lungs 2 (two) times daily.  1 Inhaler  12  . buPROPion (WELLBUTRIN SR) 150 MG 12 hr tablet Take 1 tablet (150 mg total) by mouth 2 (two) times daily.  60 tablet  0  . chlorthalidone (HYGROTON) 25 MG tablet Take 1 tablet (25 mg total) by mouth daily.  3[place]  tablet  10  . Cholecalciferol (VITAMIN D-3) 1000 UNITS CAPS Take 1 capsule by mouth daily.      . Chromium Picolinate 1000 MCG TABS Take 1 tablet by mouth daily.      . ferrous sulfate 325 (65 FE) MG tablet Take 325 mg by mouth daily with breakfast.      . glimepiride (AMARYL) 4 MG tablet Take 1 tablet (4 mg total) by mouth 2 (two) times daily.  180 tablet  1  . Insulin Detemir (LEVEMIR FLEXTOUCH) 100 UNIT/ML Pen 10 units SQ qhs--titrating according to MD instructions  15 mL  4  . lovastatin (MEVACOR) 40 MG tablet Take 1 tablet (40 mg total) by mouth 2 (two) times daily.  180 tablet  1  . metFORMIN (GLUCOPHAGE) 1000 MG tablet Take 1 tablet (1,000 mg  total) by mouth 2 (two) times daily with a meal.  180 tablet  1  . Multiple Vitamin (MULTIVITAMIN WITH MINERALS) TABS Take 1 tablet by mouth daily.      . pioglitazone (ACTOS) 45 MG tablet Take 1 tablet (45 mg total) by mouth daily.  90 tablet  1  . predniSONE (DELTASONE) 20 MG tablet Take 3 tablets (60 mg total) by mouth daily with breakfast.  42 tablet  0  . ramipril (ALTACE) 10 MG capsule Take 1 capsule (10 mg total) by mouth 2 (two) times daily.  180 capsule  1   No current facility-administered medications for this visit.    Allergies:    Allergies  Allergen Reactions  . Demerol [Meperidine] Nausea Only  . Morphine And Related Other (See Comments)    Drenched with perspiration  . Starlix [Nateglinide] Other (See Comments)    gassy    Social History:  The patient  reports that he has been smoking Cigarettes.  He has a 60 pack-year smoking history. He uses smokeless tobacco. He reports that he does not drink alcohol or use illicit drugs.   Family History:  The patient's family history includes Breast cancer in his sister; CVA in his mother; Diabetes in his maternal uncle and paternal grandmother; Heart disease in his father and maternal uncle.   ROS:  Please see the history of present illness.  No nausea, vomiting.  No fevers, chills.  No focal weakness.  No dysuria.   All other systems reviewed and negative.   PHYSICAL EXAM: VS:  BP 131/71  Pulse 89  Ht _0  (1.803 m)  Wt 192 lb (87.091 kg)  BMI 26.79 kg/m2 Well nourished, well developed, in no acute distress HEENT: normal Neck: no JVD, no carotid bruits Cardiac:  normal S1, S2; RRR;  Lungs:  clear to auscultation bilaterally, no wheezing, rhonchi or rales Abd: soft, nontender, no hepatomegaly Ext: no edema Skin: warm and dry Neuro:   no focal abnormalities noted     ASSESSMENT AND PLAN:  1. Pericarditis: Will taper prednisone. We'll have him decrease to 40 mg a day for 5 days. Then will decrease to 20 mg a day for  5 days. He will then decrease to 10 mg for 5 days and then 5 mg for 5 days. He'll then stop the prednisone. We'll check an echocardiogram in about 2 weeks to make sure that he is not starting to reaccumulate fluid. He was given steroids before the pericardiocentesis do to a presumed COPD exacerbation. Therefore, we never tried nonsteroidal anti-inflammatories.  If he has a recurrence, I would try NSAIDs and colchicine. 2. Coronary calcification: When he comes back in  about 4 weeks, if his pericarditis has resolved, we will schedule diagnostic cardiac cath at that time. 3. He has stopped smoking since leaving the hospital. Continue to abstain from cigarettes. 4. Pleural disease noted on CT scan.  Signed, Mina Marble, MD, [facility] 07/17/2013 10:05 AM

## 2013-08-08 NOTE — Progress Notes (Signed)
TR BAND REMOVAL  LOCATION:    right radial  DEFLATED PER PROTOCOL:    Yes.    TIME BAND OFF / DRESSING APPLIED:    1615   SITE UPON ARRIVAL:    Level 0  SITE AFTER BAND REMOVAL:    Level 0  REVERSE ALLEN'S TEST:     positive  CIRCULATION SENSATION AND MOVEMENT:    Within Normal Limits   Yes.    COMMENTS: initial air deflation site rebled, deflation protocol resumed after 30 min,  Tolerated procedure well

## 2013-08-08 NOTE — Progress Notes (Signed)
Report received from [facility].  Pt called RN to room with c/o severe abd discomfort that he believes is constipation, states no bm for 2-3 days.  Pt tender to palpation lower abd in center but nowhere else.  Becoming quite anxious, beginning to feel SOB.  Pt has been to bathroom 3x attempting to move bowels, passing gas but no bm.  Pt states he has voided couple times in toilet, 100 cc presently in urinal.  Bladder scanner shows >999 ml.  Pt and wife state no knowledge of enlarged prostate or difficulty urinating in past, denies hx of difficulty inserted catheter with previous surgeries.  Almyra Deforest PA notified, orders received, I&O cath performed using sterile technique with 16 FR catheter, pt tolerated well, voiced relief quickly, 950 cc clear yellow urine out. Pt appears much more comfortable and denies need for pain medicine now.  PA also notified of CBG 348, order received for SS insulin with meals but not HS.  RN gave Amaryl and Actos PO, will receive Levemir insulin tonight.  Rt radial level 0.  Call light in reach.

## 2013-08-08 NOTE — CV Procedure (Signed)
PROCEDURE:  Left heart catheterization with selective coronary angiography.  INDICATIONS:  Class III angina; DOE  The risks, benefits, and details of the procedure were explained to the patient.  The patient verbalized understanding and wanted to proceed.  Informed written consent was obtained.  PROCEDURE TECHNIQUE:  After Xylocaine anesthesia a 81F slender sheath was placed in the right radial artery with a single anterior needle wall stick.   Right coronary angiography was done using a Judkins R4 guide catheter.  Left coronary angiography was done using a Judkins L3.5 guide catheter.  Left ventriculography was done using a pigtail catheter.  A TR band was used for hemostasis.   CONTRAST:  Total of 65 cc.  COMPLICATIONS:  None.    HEMODYNAMICS:  Aortic pressure was 131/61; LV pressure was 126/12; LVEDP 18.  There was no gradient between the left ventricle and aorta.    ANGIOGRAPHIC DATA:   The left main coronary artery is patent proximally.  There is a distal 50% stenosis which seems to extend into the ostial LAD.  The left anterior descending artery is a large vessel which wraps around the apex. The proximal vessel is heavily calcified. There is an 80% proximal stenosis. There is a large first diagonal which has an 80% ostial stenosis. Just beyond the diagonal, there is a 70% LAD stenosis. There is a medium size diagonal which then originates in his patent. The remainder of the mid to distal LAD is widely patent.  The left circumflex artery is occluded at the ostium. There are left to left collaterals which fill large obtuse marginal vessel and circumflex vessel.  The right coronary artery is a large dominant vessel. In the midportion, there is a focal 80%, calcific stenosis. After the origin of a large RV marginal branch, there is a focal 75% lesion which is heavily calcified. There is moderate, calcific atherosclerosis throughout the distal RCA with a thick layer of visible  calcium.  LEFT VENTRICULOGRAM:  Left ventricular angiogram was not done.  Recent echocardiogram showed normal left ventricular function.  LVEDP was 18 mmHg.  IMPRESSIONS:  1. Severe three-vessel disease as noted above. 2. Normal left ventricular systolic function by recent echocardiogram.  LVEDP 18 mmHg.    RECOMMENDATION:  Plan cardiac surgery consultation.

## 2013-08-08 NOTE — Consult Note (Signed)
Reason for Consult:3 vessel CAD Referring Physician: Dr. Chauncey Mann Joshua [name] is an 73 y.o. male.  HPI: 73 yo man with a complicated medical history including multiple cardiac risk factors who presents with complaints of left shoulder pain and fatigue.  Joshua [name] is 73 yo gentleman with a PMH significant for heavy tobacco abuse, COPD, hypertension, dyslipidemia, poorly controlled diabetes and a recent episode of inflammatory pericarditis. He was hospitalized in late June with pericarditis. He had presented with left shoulder and back pain and general malaise. He was diagnosed with pericarditis  He had a pericardiocentesis and then was treated with steroids. He was noted to have severe coronary calcification but catheterization was postponed to avoid using blood thinners in the setting of a bloody effusion.  Since discharge he has felt better than he did when he was admitted, but still complains of left shoulder pain radiating to his back under his shoulder blade and also pain in the left flank. These pains do occur with exertion but also occur at rest. He also continues to generalized malaise and lack of energy.  Today he had cardiac catheterization which revealed severe 3 vessel CAD. He did not have a LV gram doe, but has a recent echo showing preserved LV systolic function.  He denies orthopnea and PND. + LE swelling but less than before. He says he quit smoking during his last admission and has not smoked since. He was on 60 mg of prednisone until recently. He says his sugars are poorly controlled and have been worse while on prednisone.   Past Medical History  Diagnosis Date  . Hypertension   . Tobacco dependence     Prior failure to quit with wellbutrin.  100+ pack-yr history.  Quit for 1 mo to get hip replacement (1 pack/day)  . Obesity   . Hyperplastic colon polyp 2001  . Erectile dysfunction     Normal testosterone  . Adenomatous colon polyp 10/16/2011    Repeat 2018  . Chronic  atrophic gastritis 02/25/12    gastric bx: +intestinal metaplasia, h. pylori neg, no dysplasia or malignancy.  . Macular degeneration, dry     Mild, bilat (Optometrist, Mayford Knife at [facility] in [place], [place])  . Iron deficiency anemia 2014    03/2012 capsule endoscopy showed 2 AVMs--likely responsible for his IDA--lifetime iron supp recommended + q51moCBCs.  . 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 COPD (chronic obstructive pulmonary disease)     Spirometry  2004 borderline obstruction  . Hyperlipidemia   . 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 Other and unspecified angina pectoris   . DOE (dyspnea on exertion)   . Pneumonia   . DM type 2 (diabetes mellitus, type 2)     Poor control on max oral meds--pt eventually agreed to insulin therapy  . CKD (chronic kidney disease), stage I   . Diabetic nephropathy     Elevated urine microalb/cr 03/2011    Past Surgical History  Procedure Laterality Date  . Cholecystectomy open  1999  . Appendectomy  1957  . Hip surgery Right 1954    Repair of slipped capital femoral epiphysis.  . Patella fracture surgery Left ~ 1979    bolt + 3 screws to repair tib plateau fx  . Testicle surgery  as a child    Undescended testicle brought down into scrotum  . Tonsillectomy  1947  .  Colonoscopy  10/16/2011    Procedure: COLONOSCOPY;  Surgeon: Inda Castle, MD;  Location: [facility];  Service: Endoscopy;  Laterality: N/A;  . Cataract extraction w/ intraocular lens  implant, bilateral  04/08/2006 & 04/22/2006  . Esophagogastroduodenoscopy  02/25/12    Atrophic gastritis with a few erosions--capsule endoscopy planned as of 02/25/12 (Dr. Deatra Ina).  . Total hip arthroplasty Right 08/12/2012    Procedure: REMOVAL OF OLD PINS RIGHT HIP AND RIGHT TOTAL HIP ARTHROPLASTY ANTERIOR APPROACH;  Surgeon: Mcarthur Rossetti, MD;  Location: [facility];  Service: Orthopedics;  Laterality:  Right;  . Hardware removal Right 08/12/2012    Procedure: HARDWARE REMOVAL;  Surgeon: Mcarthur Rossetti, MD;  Location: [facility];  Service: Orthopedics;  Laterality: Right;  . Cardiac catheterization  08/08/2013  . Cataract extraction w/ intraocular lens  implant, bilateral Bilateral     Family History  Problem Relation Age of Onset  . Breast cancer Sister   . Diabetes Maternal Uncle   . Diabetes Paternal Grandmother   . Heart disease Father   . Heart disease Maternal Uncle   . CVA Mother     Social History:  reports that he quit smoking about 5 weeks ago. His smoking use included Cigarettes. He has a 62 pack-year smoking history. He quit smokeless tobacco use about 5 weeks ago. He reports that he does not drink alcohol or use illicit drugs.  Allergies:  Allergies  Allergen Reactions  . Morphine And Related Other (See Comments)    Drenched with perspiration  . Demerol [Meperidine] Nausea Only  . Starlix [Nateglinide] Other (See Comments)    gassy    Medications:  Prior to Admission:  Prescriptions prior to admission  Medication Sig Dispense Refill  . aspirin 81 MG tablet Take 81 mg by mouth daily.      . budesonide-formoterol (SYMBICORT) 160-4.5 MCG/ACT inhaler Inhale 2 puffs into the lungs 2 (two) times daily.  1 Inhaler  12  . buPROPion (WELLBUTRIN SR) 150 MG 12 hr tablet Take 150 mg by mouth daily.      . chlorthalidone (HYGROTON) 25 MG tablet Take 1 tablet (25 mg total) by mouth daily.  30 tablet  10  . Cholecalciferol (VITAMIN D-3) 1000 UNITS CAPS Take 1,000 Units by mouth daily.       . Chromium Picolinate 1000 MCG TABS Take 1 tablet by mouth daily.      . ferrous sulfate 325 (65 FE) MG tablet Take 325 mg by mouth daily with breakfast.      . glimepiride (AMARYL) 4 MG tablet Take 1 tablet (4 mg total) by mouth 2 (two) times daily.  180 tablet  1  . Insulin Detemir (LEVEMIR FLEXPEN) 100 UNIT/ML Pen Inject 39 Units into the skin at bedtime.      . lovastatin (MEVACOR) 40  MG tablet Take 1 tablet (40 mg total) by mouth 2 (two) times daily.  180 tablet  1  . metFORMIN (GLUCOPHAGE) 1000 MG tablet Take 1 tablet (1,000 mg total) by mouth 2 (two) times daily with a meal.  180 tablet  1  . Multiple Vitamin (MULTIVITAMIN WITH MINERALS) TABS Take 1 tablet by mouth daily.      . nicotine (NICODERM CQ - DOSED IN MG/24 HR) 7 mg/24hr patch Place 7 mg onto the skin every other day.      . pioglitazone (ACTOS) 45 MG tablet Take 1 tablet (45 mg total) by mouth daily.  90 tablet  1  . ramipril (ALTACE) 10  MG capsule Take 1 capsule (10 mg total) by mouth 2 (two) times daily.  180 capsule  1    Results for orders placed during the hospital encounter of 08/08/13 (from the past 48 hour(s))  GLUCOSE, CAPILLARY     Status: Abnormal   Collection Time    08/08/13  7:06 AM      Result Value Ref Range   Glucose-Capillary 65 (*) 70 - 99 mg/dL  GLUCOSE, CAPILLARY     Status: Abnormal   Collection Time    08/08/13  7:46 AM      Result Value Ref Range   Glucose-Capillary 148 (*) 70 - 99 mg/dL   Comment 1 Notify RN     Comment 2 Documented in Chart    GLUCOSE, CAPILLARY     Status: None   Collection Time    08/08/13  1:55 PM      Result Value Ref Range   Glucose-Capillary 81  70 - 99 mg/dL   Comment 1 Notify RN    GLUCOSE, CAPILLARY     Status: Abnormal   Collection Time    08/08/13  5:21 PM      Result Value Ref Range   Glucose-Capillary 346 (*) 70 - 99 mg/dL   Comment 1 Notify RN      No results found.  Review of Systems  Constitutional: Positive for malaise/fatigue. Negative for fever.  Respiratory: Positive for cough and shortness of breath.   Cardiovascular: Positive for chest pain and leg swelling. Negative for orthopnea.  Gastrointestinal: Positive for abdominal pain (left flank).  Neurological: Positive for weakness.  All other systems reviewed and are negative.  Blood pressure 164/72, pulse 86, temperature 97.6 F (36.4 C), temperature source Oral, resp. rate  18, height _0  (1.803 m), weight 190 lb (86.183 kg), SpO2 95.00%. Physical Exam  Vitals reviewed. Constitutional: He is oriented to person, place, and time. He appears well-developed and well-nourished. No distress.  HENT:  Head: Normocephalic and atraumatic.  Eyes: EOM are normal. Pupils are equal, round, and reactive to light.  Neck: Neck supple. No thyromegaly present.  No carotid bruits  Cardiovascular: Normal rate, regular rhythm and normal heart sounds.  Exam reveals no friction rub.   No murmur heard. Respiratory: Effort normal and breath sounds normal. He has no wheezes.  GI: Soft. There is no tenderness.  Musculoskeletal: He exhibits no edema.  Lymphadenopathy:    He has no cervical adenopathy.  Neurological: He is alert and oriented to person, place, and time. No cranial nerve deficit.  Skin: Skin is warm and dry.   ECHOCARDIOGRAM 07/28/2013 Study Conclusions  - Left ventricle: The cavity size was normal. Wall thickness was increased in a pattern of severe LVH. Systolic function was normal. The estimated ejection fraction was in the range of 55% to 60%. Wall motion was normal; there were no regional wall motion abnormalities. - Pericardium, extracardiac: The pericardium is somewhat bright and thickened. No pericardial effusion is present.  Impressions:  - No change compared to prior study on 07/04/13. Pericardial effusion remains absent.  CARDIAC CATHETERIZATION   HEMODYNAMICS: Aortic pressure was 131/61; LV pressure was 126/12; LVEDP 18. There was no gradient between the left ventricle and aorta.  ANGIOGRAPHIC DATA: The left main coronary artery is patent proximally. There is a distal 50% stenosis which seems to extend into the ostial LAD.  The left anterior descending artery is a large vessel which wraps around the apex. The proximal vessel is heavily calcified. There is  an 80% proximal stenosis. There is a large first diagonal which has an 80% ostial stenosis.  Just beyond the diagonal, there is a 70% LAD stenosis. There is a medium size diagonal which then originates in his patent. The remainder of the mid to distal LAD is widely patent.  The left circumflex artery is occluded at the ostium. There are left to left collaterals which fill large obtuse marginal vessel and circumflex vessel.  The right coronary artery is a large dominant vessel. In the midportion, there is a focal 80%, calcific stenosis. After the origin of a large RV marginal branch, there is a focal 75% lesion which is heavily calcified. There is moderate, calcific atherosclerosis throughout the distal RCA with a thick layer of visible calcium.  LEFT VENTRICULOGRAM: Left ventricular angiogram was not done. Recent echocardiogram showed normal left ventricular function. LVEDP was 18 mmHg.  IMPRESSIONS:  1. Severe three-vessel disease as noted above. 2. Normal left ventricular systolic function by recent echocardiogram. LVEDP 18 mmHg.  RECOMMENDATION: Plan cardiac surgery consultation    Assessment/Plan: 73 yo man with multiple cardiac risk factors who presents with left shoulder pain and weakness/ fatigue. He has severe 3 vessel CAD with preserved LV function. CABG is indicated for survival benefit and possible relief of symptoms. It is unclear how much difference it will make symptomatically as his symptoms are not classic and he is fairly poor historian.  I informed him of the indications, risks, benefits and alternatives(medical therapy). I discussed the general nature of the procedure, the need for general anesthesia, and the incisions to be used with Joshua [name] and his family. We reviewed the expected hospital stay, overall recovery and short and long term outcomes. They understand the risks include, but are not limited to death, stroke, MI, DVT/PE, bleeding, possible need for transfusion, infections, cardiac arrhythmias, and other organ system dysfunction including respiratory, renal, or GI  complications.   We did discuss several factors which increase his operative risk. He has ascending aortic calcific atherosclerotic disease increasing his risk for stroke. He has had recent pericarditis which could make the surgery more difficult technically and increase the risk of bleeding and transfusion. He has poorly controlled diabetes and recent steroids which increase his risk for infection and wound healing problems. Heavy tobacco abuse history with an increased risk of respiratory complications. Given his recent high dose steroids I suspect we will need to cover him with steroids perioperatively.  He understands the risks and wishes to proceed.  Currently the first available OR time is next Monday 08/14/2013  He needs a carotid duplex and PFTs preop   Hartman Minahan C 08/08/2013, 6:03 PM

## 2013-08-08 NOTE — Interval H&P Note (Signed)
Cath Lab Visit (complete for each Cath Lab visit)  Clinical Evaluation Leading to the Procedure:   ACS: No.  Non-ACS:    Anginal Classification: CCS III  Anti-ischemic medical therapy: No Therapy  Non-Invasive Test Results: No non-invasive testing performed  Prior CABG: No previous CABG   Patient with pericardiocentesis performed several weeks ago. It was noted that he had severe coronary calcifications. He has had severe dyspnea on exertion which has limited even basic activities.  The study is to evaluate for the presence of coronary artery disease.   History and Physical Interval Note:  08/08/2013 12:40 PM  Donata Clay  has presented today for surgery, with the diagnosis of coronary calcification  The various methods of treatment have been discussed with the patient and family. After consideration of risks, benefits and other options for treatment, the patient has consented to  Procedure(s): LEFT HEART CATHETERIZATION WITH CORONARY ANGIOGRAM (N/A) as a surgical intervention .  The patient's history has been reviewed, patient examined, no change in status, stable for surgery.  I have reviewed the patient's chart and labs.  Questions were answered to the patient's satisfaction.     Niang Mitcheltree S.

## 2013-08-08 NOTE — Progress Notes (Signed)
Hypoglycemic Event  CBG: 65  Treatment: D50 IV 25 mL  Symptoms: None  Follow-up CBG: [id number] CBG Result:148  Possible Reasons for Event: Other: NPO since midnight  Comments/MD notified: will continue to monitor     Joshua [name], [employer]  Remember to initiate Hypoglycemia Order Set & complete

## 2013-08-08 NOTE — Progress Notes (Signed)
CBG 415, stat lab ordered to verify per protocol.  Levimir insulin given once lab drawn.  Lab result 404.  Pt called RN to room 1 hr after insulin given, stating he is "starting to sweat".  Pt concerned because he has recently been dropping low in mornings.  CBG rechecked, now 379.  Dr. Delane Ginger notified, no orders received.  Socks and blankets removed. BP 158/61, denies any other symptoms.  Call light in reach.  Voided 100 cc in urinal.

## 2013-08-09 ENCOUNTER — Ambulatory Visit (HOSPITAL_COMMUNITY): Payer: Medicare HMO

## 2013-08-09 DIAGNOSIS — J438 Other emphysema: Secondary | ICD-10-CM

## 2013-08-09 DIAGNOSIS — Z01810 Encounter for preprocedural cardiovascular examination: Secondary | ICD-10-CM

## 2013-08-09 LAB — PULMONARY FUNCTION TEST
DL/VA % PRED: 98 %
DL/VA: 4.57 ml/min/mmHg/L
DLCO UNC % PRED: 71 %
DLCO cor % pred: 71 %
DLCO cor: 23.98 ml/min/mmHg
DLCO unc: 23.98 ml/min/mmHg
FEF 25-75 POST: 1.56 L/s
FEF 25-75 Pre: 1.09 L/sec
FEF2575-%CHANGE-POST: 42 %
FEF2575-%PRED-POST: 66 %
FEF2575-%Pred-Pre: 46 %
FEV1-%Change-Post: 9 %
FEV1-%PRED-POST: 71 %
FEV1-%PRED-PRE: 65 %
FEV1-PRE: 2.12 L
FEV1-Post: 2.32 L
FEV1FVC-%CHANGE-POST: 3 %
FEV1FVC-%PRED-PRE: 88 %
FEV6-%Change-Post: 4 %
FEV6-%Pred-Post: 81 %
FEV6-%Pred-Pre: 77 %
FEV6-Post: 3.39 L
FEV6-Pre: 3.24 L
FEV6FVC-%Change-Post: 0 %
FEV6FVC-%Pred-Post: 103 %
FEV6FVC-%Pred-Pre: 104 %
FVC-%CHANGE-POST: 5 %
FVC-%Pred-Post: 78 %
FVC-%Pred-Pre: 74 %
FVC-Post: 3.47 L
FVC-Pre: 3.29 L
POST FEV1/FVC RATIO: 67 %
PRE FEV1/FVC RATIO: 65 %
PRE FEV6/FVC RATIO: 98 %
Post FEV6/FVC ratio: 98 %
RV % PRED: 88 %
RV: 2.28 L
TLC % pred: 79 %
TLC: 5.73 L

## 2013-08-09 LAB — URINALYSIS, ROUTINE W REFLEX MICROSCOPIC
Bilirubin Urine: NEGATIVE
Glucose, UA: 250 mg/dL — AB
Ketones, ur: NEGATIVE mg/dL
Leukocytes, UA: NEGATIVE
Nitrite: NEGATIVE
Protein, ur: NEGATIVE mg/dL
Specific Gravity, Urine: 1.012 (ref 1.005–1.030)
Urobilinogen, UA: 1 mg/dL (ref 0.0–1.0)
pH: 6.5 (ref 5.0–8.0)

## 2013-08-09 LAB — GLUCOSE, CAPILLARY
GLUCOSE-CAPILLARY: 307 mg/dL — AB (ref 70–99)
Glucose-Capillary: 106 mg/dL — ABNORMAL HIGH (ref 70–99)
Glucose-Capillary: 240 mg/dL — ABNORMAL HIGH (ref 70–99)
Glucose-Capillary: 185 mg/dL — ABNORMAL HIGH (ref 70–99)

## 2013-08-09 LAB — URINE MICROSCOPIC-ADD ON

## 2013-08-09 MED ORDER — TAMSULOSIN HCL 0.4 MG PO CAPS: 0.4000 mg | ORAL_CAPSULE | Freq: Every day | ORAL | Status: DC

## 2013-08-09 MED ORDER — METFORMIN HCL 1000 MG PO TABS: 1000.0000 mg | ORAL_TABLET | Freq: Two times a day (BID) | ORAL | Status: DC

## 2013-08-09 MED ORDER — TAMSULOSIN HCL 0.4 MG PO CAPS
0.4000 mg | ORAL_CAPSULE | Freq: Every day | ORAL | Status: DC
  Filled 2013-08-09: qty 1

## 2013-08-09 MED ORDER — NITROGLYCERIN 0.4 MG SL SUBL: 0.4000 mg | SUBLINGUAL_TABLET | SUBLINGUAL | Status: DC | PRN

## 2013-08-09 MED ORDER — TAMSULOSIN HCL 0.4 MG PO CAPS
0.4000 mg | ORAL_CAPSULE | Freq: Once | ORAL | Status: AC
  Administered 2013-08-09: 16:00:00 0.4 mg via ORAL
  Filled 2013-08-09: qty 1

## 2013-08-09 MED ORDER — ISOSORBIDE MONONITRATE ER 30 MG PO TB24: 30.0000 mg | ORAL_TABLET | Freq: Every day | ORAL | Status: DC

## 2013-08-09 MED ORDER — ALBUTEROL SULFATE (2.5 MG/3ML) 0.083% IN NEBU
2.5000 mg | INHALATION_SOLUTION | Freq: Once | RESPIRATORY_TRACT | Status: AC
  Administered 2013-08-09: 09:00:00 2.5 mg via RESPIRATORY_TRACT

## 2013-08-09 MED ORDER — ISOSORBIDE MONONITRATE ER 30 MG PO TB24
30.0000 mg | ORAL_TABLET | Freq: Every day | ORAL | Status: DC
  Administered 2013-08-09 – 2013-08-10 (×2): 30 mg via ORAL
  Filled 2013-08-09 (×2): qty 1

## 2013-08-09 MED ORDER — ACETAMINOPHEN 325 MG PO TABS: 650.0000 mg | ORAL_TABLET | ORAL | Status: AC | PRN

## 2013-08-09 NOTE — Discharge Summary (Signed)
Patient interviewed ans examined. Cath site stable. Plan home today. Imdur started. Surgery on Monday.

## 2013-08-09 NOTE — Discharge Summary (Addendum)
Patient ID: Joshua [name],  MRN: [id number], DOB/AGE: 02/11/1940 73 y.o.  Admit date: 08/08/2013 Discharge date: 08/10/2013  Primary Care Provider: Tammi Sou, MD  Primary Cardiologist: Dr Lendell Caprice  Discharge Diagnoses Principal Problem:   Coronary artery disease- 3 V at cath 08/08/13 Active Problems:   Type II or unspecified type diabetes mellitus without mention of complication, uncontrolled   HTN (hypertension), benign   Hyperlipidemia   COPD (chronic obstructive pulmonary disease)   CKD (chronic kidney disease), stage I   Fatigue   Pericardial effusion with cardiac tamponade- s/p perciocentesis 06/30/13   PAF (paroxysmal atrial fibrillation)   DOE (dyspnea on exertion)    Procedures; Coronary angiogram 08/08/13   Hospital Course:  Pt is a 73 y.o. male with a long history of tobacco abuse. He was hospitalized due to fatigue and found to have a large pericardial effusion in June 2015.  He underwent pericardiocentesis 06/30/13. 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 There was no evidence of malignancy on workup. He has been on 60 mg of prednisone since hospital discharge. He has not had any further recurrence of the back pain that he had initially a hospital presentation. His energy level is better but not back to where he wants to be. He denies any orthopnea or PND. His lower extremities are much less swollen now than they were before the pericardiocentesis. Overall, he feels better, but not back to baseline. He is still complaining of early fatigue.             In the hospital, he underwent pericardiocentesis and there is obvious calcification of his coronary arteries. Cardiac catheterization was postponed due to avoiding any blood thinning medication. The thought was blood thinners may worsen a bloody pericardial effusion. He denies any anginal symptoms. He was admitted for diagnostic cath 08/08/13. This revealed severe 3 V disease. He was seen by Dr Roxan Hockey and  the plan is for CABG on Monday 08/14/13. He did have some trouble voiding after his cath and had to have an in and out foley placed. He reportedly had 900 cc of urine. The next morning he was doing better and urinating OK per RN on the floor. UA was unremarkable. Imdur and prn NTG were added.      Discharge Vitals:  Blood pressure 108/46, pulse 92, temperature 98.3 F (36.8 C), temperature source Oral, resp. rate 20, height _0  (1.803 m), weight 197 lb 12 oz (89.7 kg), SpO2 95.00%.    Labs: Results for orders placed during the hospital encounter of 08/08/13 (from the past 24 hour(s))  URINALYSIS, ROUTINE W REFLEX MICROSCOPIC     Status: Abnormal   Collection Time    08/09/13 11:48 AM      Result Value Ref Range   Color, Urine YELLOW  YELLOW   APPearance CLEAR  CLEAR   Specific Gravity, Urine 1.012  1.005 - 1.030   pH 6.5  5.0 - 8.0   Glucose, UA 250 (*) NEGATIVE mg/dL   Hgb urine dipstick MODERATE (*) NEGATIVE   Bilirubin Urine NEGATIVE  NEGATIVE   Ketones, ur NEGATIVE  NEGATIVE mg/dL   Protein, ur NEGATIVE  NEGATIVE mg/dL   Urobilinogen, UA 1.0  0.0 - 1.0 mg/dL   Nitrite NEGATIVE  NEGATIVE   Leukocytes, UA NEGATIVE  NEGATIVE  URINE MICROSCOPIC-ADD ON     Status: None   Collection Time    08/09/13 11:48 AM      Result Value Ref Range   Squamous Epithelial /  LPF RARE  RARE   WBC, UA 0-2  <3 WBC/hpf   RBC / HPF 11-20  <3 RBC/hpf  GLUCOSE, CAPILLARY     Status: Abnormal   Collection Time    08/09/13  1:07 PM      Result Value Ref Range   Glucose-Capillary 240 (*) 70 - 99 mg/dL  GLUCOSE, CAPILLARY     Status: Abnormal   Collection Time    08/09/13  6:09 PM      Result Value Ref Range   Glucose-Capillary 185 (*) 70 - 99 mg/dL  GLUCOSE, CAPILLARY     Status: Abnormal   Collection Time    08/09/13 10:22 PM      Result Value Ref Range   Glucose-Capillary 307 (*) 70 - 99 mg/dL   Comment 1 Notify RN     Comment 2 Documented in Chart    GLUCOSE, CAPILLARY     Status: None    Collection Time    08/10/13  8:40 AM      Result Value Ref Range   Glucose-Capillary 74  70 - 99 mg/dL    Disposition:      Follow-up Information   Follow up with Melrose Nakayama, MD. (Office will contact you about surgery Monday)    Specialty:  Cardiothoracic Surgery   Contact information:   [address] [place] [address] [phone]       Discharge Medications:    Medication List    STOP taking these medications       chlorthalidone 25 MG tablet  Commonly known as:  HYGROTON      TAKE these medications       acetaminophen 325 MG tablet  Commonly known as:  TYLENOL  Take 2 tablets (650 mg total) by mouth every 4 (four) hours as needed for headache or mild pain.     aspirin 81 MG tablet  Take 81 mg by mouth daily.     budesonide-formoterol 160-4.5 MCG/ACT inhaler  Commonly known as:  SYMBICORT  Inhale 2 puffs into the lungs 2 (two) times daily.     buPROPion 150 MG 12 hr tablet  Commonly known as:  WELLBUTRIN SR  Take 150 mg by mouth daily.     Chromium Picolinate 1000 MCG Tabs  Take 1 tablet by mouth daily.     ferrous sulfate 325 (65 FE) MG tablet  Take 325 mg by mouth daily with breakfast.     glimepiride 4 MG tablet  Commonly known as:  AMARYL  Take 1 tablet (4 mg total) by mouth 2 (two) times daily.     isosorbide mononitrate 30 MG 24 hr tablet  Commonly known as:  IMDUR  Take 1 tablet (30 mg total) by mouth daily.     LEVEMIR FLEXPEN 100 UNIT/ML Pen  Generic drug:  Insulin Detemir  Inject 39 Units into the skin at bedtime.     lovastatin 40 MG tablet  Commonly known as:  MEVACOR  Take 1 tablet (40 mg total) by mouth 2 (two) times daily.     metFORMIN 1000 MG tablet  Commonly known as:  GLUCOPHAGE  Take 1 tablet (1,000 mg total) by mouth 2 (two) times daily with a meal.  Start taking on:  08/11/2013     metoprolol tartrate 25 MG tablet  Commonly known as:  LOPRESSOR  Take 0.5 tablets (12.5 mg total) by mouth 2  (two) times daily.     multivitamin with minerals Tabs tablet  Take  1 tablet by mouth daily.     nicotine 7 mg/24hr patch  Commonly known as:  NICODERM CQ - dosed in mg/24 hr  Place 7 mg onto the skin every other day.     nitroGLYCERIN 0.4 MG SL tablet  Commonly known as:  NITROSTAT  Place 1 tablet (0.4 mg total) under the tongue every 5 (five) minutes as needed for chest pain.     pioglitazone 45 MG tablet  Commonly known as:  ACTOS  Take 1 tablet (45 mg total) by mouth daily.     ramipril 10 MG capsule  Commonly known as:  ALTACE  Take 1 capsule (10 mg total) by mouth daily.     tamsulosin 0.4 MG Caps capsule  Commonly known as:  FLOMAX  Take 1 capsule (0.4 mg total) by mouth daily after supper.     Vitamin D-3 1000 UNITS Caps  Take 1,000 Units by mouth daily.         Duration of Discharge Encounter: Greater than 30 minutes including physician time.  Signed, Kerin Ransom PA-C 08/10/2013 11:37 AM  Ramipril was decreased to once daily and lopressor 12.23m BID was added.  Urinary OP improved with Flomax.  The patient was seen by Dr. KClaiborne Billingswho felt he was stable for DC home.  Nevaeh Korte, PA-C

## 2013-08-09 NOTE — Progress Notes (Signed)
He has severe 3 Vessel CAD with LM. Plan surgery on Monday. Radial cath site okay. Plan discharge today on Imdur and with NTG sl if needed. Will alert [facility] of discharge. Notified to call if any chest or breathing problems.

## 2013-08-09 NOTE — Progress Notes (Signed)
Subjective:  No chest pain  Objective:  Vital Signs in the last 24 hours: Temp:  [97.4 F (36.3 C)-98.7 F (37.1 C)] 97.6 F (36.4 C) (08/05 0415) Pulse Rate:  [77-95] 87 (08/05 0415) Resp:  [15-20] 20 (08/05 0415) BP: (119-164)/(58-79) 119/63 mmHg (08/05 0415) SpO2:  [93 %-97 %] 97 % (08/05 0415) Weight:  [199 lb 1.2 oz (90.3 kg)] 199 lb 1.2 oz (90.3 kg) (08/05 0005)  Intake/Output from previous day:  Intake/Output Summary (Last 24 hours) at 08/09/13 0745 Last data filed at 08/09/13 0300  Gross per 24 hour  Intake 1363.85 ml  Output   3825 ml  Net -2461.15 ml    Physical Exam: General appearance: alert, cooperative and no distress Lungs: clear to auscultation bilaterally Heart: regular rate and rhythm Extremities: Rt wrist without hematoma   Rate: 86  Rhythm: normal sinus rhythm  Lab Results: No results found for this basename: WBC, HGB, PLT,  in the last 72 hours  Recent Labs  08/08/13 2200  GLUCOSE 404*   No results found for this basename: TROPONINI, CK, MB,  in the last 72 hours No results found for this basename: INR,  in the last 72 hours  Imaging: Imaging results have been reviewed  Cardiac Studies:  Assessment/Plan:  73 y/o patient with pericardiocentesis performed several weeks ago. It was noted then that he had severe coronary calcifications. He has had severe dyspnea on exertion which has limited even basic activities. He was admitted 08/08/13 for diagnostic cath. This revealed severe 3 V CAD. He was seen by CVTS last night- PFTs and carotid dopplers today, CABG planned for Monday.    Principal Problem:   Coronary artery disease- 3 V at cath 08/08/13 Active Problems:   Fatigue   DOE (dyspnea on exertion)   Type II or unspecified type diabetes mellitus without mention of complication, uncontrolled   COPD (chronic obstructive pulmonary disease)   Pericardial effusion with cardiac tamponade- s/p perciocentesis July 2015   PAF (paroxysmal atrial  fibrillation)   HTN (hypertension), benign   Hyperlipidemia   CKD (chronic kidney disease), stage I    PLAN: MD to see. Discharge disposition was left to [place]. He really was not having unstable anginal symptoms before admission.   Kerin Ransom PA-C Beeper [phone] 08/09/2013, 7:45 AM

## 2013-08-09 NOTE — Progress Notes (Addendum)
VASCULAR LAB PRELIMINARY  PRELIMINARY  PRELIMINARY  PRELIMINARY  Pre-op Cardiac Surgery  Carotid Findings:  Bilateral:  Vertebral artery flow is antegrade.  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  Left:  1-39% ICA stenosis.    Upper Extremity Right Left  Brachial Pressures 113 triphasic 109 triphasic  Radial Waveforms triphasic triphasic  Ulnar Waveforms triphasic triphasic  Palmar Arch (Allen's Test) * **   Findings:  *Right:  Doppler waveforms remain normal with ulnar and radial compressions.  **Left:  Doppler waveforms decrease greater than 50% with ulnar and remain normal with radial compressions.    Lower  Extremity Right Left  Dorsalis Pedis    Anterior Tibial 116 triphasic 113 triphasic  Posterior Tibial 151 triphasic 70 dampened monophasic  Ankle/Brachial Indices >1.0 1.0    Findings:  ABI is within normal limits bilaterally with abnormal Doppler waveforms in the left posterior tibial artery.   Shantele Reller, RVT 08/09/2013, 10:20 AM

## 2013-08-09 NOTE — Progress Notes (Signed)
Above noted and agree. Home in AM if urine output improves.

## 2013-08-09 NOTE — Discharge Instructions (Signed)
Coronary Angiogram A coronary angiogram, also called coronary angiography, is an X-ray procedure used to look at the arteries in the heart. In this procedure, a dye (contrast dye) is injected through a long, hollow tube (catheter). The catheter is about the size of a piece of cooked spaghetti and is inserted through your groin, wrist, or arm. The dye is injected into each artery, and X-rays are then taken to show if there is a blockage in the arteries of your heart. LET [facility] CARE PROVIDER KNOW ABOUT:  Any allergies you have, including allergies to shellfish or contrast dye.   All medicines you are taking, including vitamins, herbs, eye drops, creams, and over-the-counter medicines.   Previous problems you or members of your family have had with the use of anesthetics.   Any blood disorders you have.   Previous surgeries you have had.  History of kidney problems or failure.   Other medical conditions you have. RISKS AND COMPLICATIONS  Generally, a coronary angiogram is a safe procedure. However, problems can occur and include:  Allergic reaction to the dye.  Bleeding from the access site or other locations.  Kidney injury, especially in people with impaired kidney function.  Stroke (rare).  Heart attack (rare). BEFORE THE PROCEDURE   Do not eat or drink anything after midnight the night before the procedure or as directed by your health care provider.   Ask your health care provider about changing or stopping your regular medicines. This is especially important if you are taking diabetes medicines or blood thinners. PROCEDURE  You may be given a medicine to help you relax (sedative) before the procedure. This medicine is given through an intravenous (IV) access tube that is inserted into one of your veins.   The area where the catheter will be inserted will be washed and shaved. This is usually done in the groin but may be done in the fold of your arm (near your  elbow) or in the wrist.   A medicine will be given to numb the area where the catheter will be inserted (local anesthetic).   The health care provider will insert the catheter into an artery. The catheter will be guided by using a special type of X-ray (fluoroscopy) of the blood vessel being examined.   A special dye will then be injected into the catheter, and X-rays will be taken. The dye will help to show where any narrowing or blockages are located in the heart arteries.  AFTER THE PROCEDURE   If the procedure is done through the leg, you will be kept in bed lying flat for several hours. You will be instructed to not bend or cross your legs.  The insertion site will be checked frequently.   The pulse in your feet or wrist will be checked frequently.   Additional blood tests, X-rays, and an electrocardiogram may be done.  Document Released: 06/28/2002 Document Revised: 05/08/2013 Document Reviewed: 05/16/2012 [facility] Patient Information 2015 [place], [place]. This information is not intended to replace advice given to you by your health care provider. Make sure you discuss any questions you have with your health care provider.

## 2013-08-09 NOTE — Care Management Note (Addendum)
  Page 1 of 1   08/09/2013     12:31:28 PM CARE MANAGEMENT NOTE 08/09/2013  Patient:  Joshua [name], Joshua [name]   Account Number:  [id number]  Date Initiated:  08/09/2013  Documentation initiated by:  Jaymari Cromie  Subjective/Objective Assessment:   coronary calcification     Action/Plan:   CM to follow for disposition needs   Anticipated DC Date:  08/09/2013   Anticipated DC Plan:  HOME/SELF CARE         Choice offered to / List presented to:             Status of service:  Completed, signed off Medicare Important Message given?  NA - LOS <3 / Initial given by admissions (If response is "NO", the following Medicare IM given date fields will be blank) Date Medicare IM given:   Medicare IM given by:   Date Additional Medicare IM given:   Additional Medicare IM given by:    Discharge Disposition:  HOME/SELF CARE  Per UR Regulation:  Reviewed for med. necessity/level of care/duration of stay  If discussed at [place] of Stay Meetings, dates discussed:    Comments:  Dezi Brauner RN, BSN, MSHL, CCM  Nurse - Case Manager, (Unit [phone]  08/09/2013 Home / self care

## 2013-08-09 NOTE — Progress Notes (Signed)
Despite earlier reports that the pt was voiding OK, RN now reports that he is still having difficulty voiding. Will add Flomax and keep overnight to observe.   Kerin Ransom PA-C 08/09/2013 2:36 PM

## 2013-08-09 NOTE — Progress Notes (Signed)
UR completed Timera Windt K. Glayds [place], RN, BSN, [place], CCM  08/09/2013 12:24 PM

## 2013-08-10 DIAGNOSIS — R072 Precordial pain: Secondary | ICD-10-CM

## 2013-08-10 DIAGNOSIS — I2510 Atherosclerotic heart disease of native coronary artery without angina pectoris: Principal | ICD-10-CM

## 2013-08-10 LAB — GLUCOSE, CAPILLARY
GLUCOSE-CAPILLARY: 74 mg/dL (ref 70–99)
Glucose-Capillary: 176 mg/dL — ABNORMAL HIGH (ref 70–99)

## 2013-08-10 MED ORDER — TAMSULOSIN HCL 0.4 MG PO CAPS: 0.4000 mg | ORAL_CAPSULE | Freq: Every day | ORAL | Status: DC

## 2013-08-10 MED ORDER — METOPROLOL TARTRATE 25 MG PO TABS: 12.5000 mg | ORAL_TABLET | Freq: Two times a day (BID) | ORAL | Status: DC

## 2013-08-10 MED ORDER — RAMIPRIL 10 MG PO CAPS: 10.0000 mg | ORAL_CAPSULE | Freq: Every day | ORAL | Status: DC

## 2013-08-10 NOTE — Progress Notes (Signed)
CARDIAC REHAB PHASE I   PRE:  Rate/Rhythm: 94SR  BP:  Supine:   Sitting: 108/46  Standing:    SaO2:   MODE:  Ambulation: 300 ft   POST:  Rate/Rhythm: 110 ST  BP:  Supine:   Sitting: 126/54  Standing:    SaO2:  0800-0835 Pt walked 300 ft with asst x 1 with steady gait. Pt has rolling walker and rollator for home use. Practiced with pt rocking and assisting to stand without pushing on arms. Pt has weak knees and will need assistance. Gave [facility] booklet and care guide. Discussed importance of walking and IS after surgery. Discussed sternal precautions. Pt stated his wife will be available to stay 24/7 first week home after surgery. Has already seen pre op video. Will follow up after surgery. Graylon Good RN BSN 08/10/2013 8:36 AM      Graylon Good, RN BSN  08/10/2013 8:32 AM

## 2013-08-10 NOTE — Progress Notes (Signed)
Subjective: Urine OP/flow has improved.  Objective: Vital signs in last 24 hours: Temp:  [97.3 F (36.3 C)-98.9 F (37.2 C)] 98.3 F (36.8 C) (08/06 0818) Pulse Rate:  [32-92] 92 (08/06 0818) Resp:  [16-20] 20 (08/06 0818) BP: (93-136)/(41-54) 108/46 mmHg (08/06 0818) SpO2:  [93 %-97 %] 95 % (08/06 0818) Weight:  [197 lb 12 oz (89.7 kg)] 197 lb 12 oz (89.7 kg) (08/06 0346) Last BM Date: 08/08/13  Intake/Output from previous day: 08/05 0701 - 08/06 0700 In: 600 [P.O.:600] Out: 2550 [Urine:2550] Intake/Output this shift:    Medications Current Facility-Administered Medications  Medication Dose Route Frequency Provider Last Rate Last Dose  . acetaminophen (TYLENOL) tablet 650 mg  650 mg Oral Q4H PRN Jettie Booze, MD      . aspirin chewable tablet 81 mg  81 mg Oral Daily Jettie Booze, MD   81 mg at 08/10/13 1540  . budesonide-formoterol (SYMBICORT) 160-4.5 MCG/ACT inhaler 2 puff  2 puff Inhalation BID Jettie Booze, MD   2 puff at 08/09/13 2107  . buPROPion [facility] SR) 12 hr tablet 150 mg  150 mg Oral Daily Jettie Booze, MD   150 mg at 08/10/13 0924  . docusate sodium (COLACE) capsule 100 mg  100 mg Oral Daily PRN Almyra Deforest, PA   100 mg at 08/08/13 2213  . ferrous sulfate tablet 325 mg  325 mg Oral Q breakfast Jettie Booze, MD   325 mg at 08/10/13 0867  . glimepiride (AMARYL) tablet 4 mg  4 mg Oral BID WC Jettie Booze, MD   4 mg at 08/10/13 0900  . insulin aspart (novoLOG) injection 0-15 Units  0-15 Units Subcutaneous TID WC Almyra Deforest, PA   3 Units at 08/09/13 1835  . insulin detemir (LEVEMIR) injection 39 Units  39 Units Subcutaneous QHS Jettie Booze, MD   39 Units at 08/09/13 2224  . isosorbide mononitrate (IMDUR) 24 hr tablet 30 mg  30 mg Oral Daily Belva Crome III, MD   30 mg at 08/10/13 6195  . multivitamin with minerals tablet 1 tablet  1 tablet Oral Daily Jettie Booze, MD   1 tablet at 08/10/13 0925  . nicotine  (NICODERM CQ - dosed in mg/24 hr) patch 7 mg  7 mg Transdermal Q48H Jettie Booze, MD      . ondansetron [facility]) injection 4 mg  4 mg Intravenous Q6H PRN Jettie Booze, MD      . pioglitazone (ACTOS) tablet 45 mg  45 mg Oral Daily Jettie Booze, MD   45 mg at 08/10/13 0932  . polyethylene glycol (MIRALAX / GLYCOLAX) packet 17 g  17 g Oral Daily PRN Almyra Deforest, PA   17 g at 08/08/13 2213  . ramipril (ALTACE) capsule 10 mg  10 mg Oral BID Jettie Booze, MD   10 mg at 08/10/13 6712  . simvastatin (ZOCOR) tablet 20 mg  20 mg Oral q1800 Jettie Booze, MD   20 mg at 08/09/13 1850  . tamsulosin (FLOMAX) capsule 0.4 mg  0.4 mg Oral QPC supper Erlene Quan, PA-C        PE: General appearance: alert, cooperative and no distress Lungs: clear to auscultation bilaterally Heart: regular rate and rhythm, S1, S2 normal, no murmur, click, rub or gallop Extremities: No LEE Pulses: 2+ and symmetric Skin: Warm and dry.  Right radial cath site stable.  Neurologic: Grossly normal  Lab Results:  No  results found for this basename: WBC, HGB, HCT, PLT,  in the last 72 hours BMET  Recent Labs  08/08/13 2200  GLUCOSE 404*    Assessment/Plan    Principal Problem:   Coronary artery disease- 3 V at cath 08/08/13 Active Problems:   Type II or unspecified type diabetes mellitus without mention of complication, uncontrolled   HTN (hypertension), benign   Hyperlipidemia   COPD (chronic obstructive pulmonary disease)   CKD (chronic kidney disease), stage I   Fatigue   Pericardial effusion with cardiac tamponade- s/p perciocentesis 06/30/13   PAF (paroxysmal atrial fibrillation)   DOE (dyspnea on exertion)  Plan:   SP diagnostic cardiac cath which revealed Severe three-vessel disease as noted above.   Normal left ventricular systolic function by recent echocardiogram. LVEDP 18 mmHg.   CABG planned for Monday.  Follow up echo on 7/24 revealed normal EF and wall motion.  No  pericardial effusion.   He was going to be discharged yesterday but was having voiding issues.  He was straight cathed once and started on flowmax.  He reports improvement with voiding since yesterday.   He ambulated with cardiac rehab 354f with steady gait.  Ms. DConstance Goltzfeel she can go home prior to surgery and I agree.   ASA, Imdur 30, ramipril 10 BID, Zocor.   LOS: 2 days    HAGER, BRYAN PA-C 08/10/2013 9:54 AM   Patient seen and examined. Agree with assessment and plan.Resting P 94; favor addition of low dose lopressor. BP has been soft. Favor decrease ramipril from 10 mg bid to 10 daily and start lopressor 12.5 mg bid. DC today and CABG as planned on 08/14/13.   TTroy Sine MD, FNew Britain Surgery Center LLC8/06/2013 10:53 AM

## 2013-08-11 ENCOUNTER — Inpatient Hospital Stay (HOSPITAL_COMMUNITY): Admission: RE | Admit: 2013-08-11 | Payer: Commercial Managed Care - HMO | Source: Ambulatory Visit

## 2013-08-11 ENCOUNTER — Other Ambulatory Visit: Payer: Self-pay

## 2013-08-11 ENCOUNTER — Ambulatory Visit (HOSPITAL_COMMUNITY)
Admission: RE | Admit: 2013-08-11 | Discharge: 2013-08-11 | Disposition: A | Discharge status: Home / Self Care | Payer: Medicare HMO | Source: Ambulatory Visit | Attending: Thoracic Surgery (Cardiothoracic Vascular Surgery) | Admitting: Thoracic Surgery (Cardiothoracic Vascular Surgery)

## 2013-08-11 ENCOUNTER — Encounter (HOSPITAL_COMMUNITY)
Admission: RE | Admit: 2013-08-11 | Discharge: 2013-08-11 | Disposition: A | Discharge status: Home / Self Care | Payer: Medicare HMO | Source: Ambulatory Visit | Attending: Thoracic Surgery (Cardiothoracic Vascular Surgery) | Admitting: Thoracic Surgery (Cardiothoracic Vascular Surgery)

## 2013-08-11 ENCOUNTER — Encounter: Payer: Self-pay | Admitting: Family Medicine

## 2013-08-11 VITALS — BP 118/59 | HR 67 | Temp 98.3°F | Resp 19 | Ht 71.0 in | Wt 202.4 lb

## 2013-08-11 DIAGNOSIS — I10 Essential (primary) hypertension: Secondary | ICD-10-CM | POA: Insufficient documentation

## 2013-08-11 DIAGNOSIS — I2510 Atherosclerotic heart disease of native coronary artery without angina pectoris: Secondary | ICD-10-CM

## 2013-08-11 DIAGNOSIS — Z01818 Encounter for other preprocedural examination: Secondary | ICD-10-CM | POA: Diagnosis not present

## 2013-08-11 DIAGNOSIS — J90 Pleural effusion, not elsewhere classified: Secondary | ICD-10-CM | POA: Insufficient documentation

## 2013-08-11 HISTORY — DX: Atherosclerotic heart disease of native coronary artery without angina pectoris: I25.10

## 2013-08-11 LAB — COMPREHENSIVE METABOLIC PANEL
ALT: 14 U/L (ref 0–53)
ANION GAP: 15 (ref 5–15)
AST: 13 U/L (ref 0–37)
Albumin: 2.8 g/dL — ABNORMAL LOW (ref 3.5–5.2)
Alkaline Phosphatase: 88 U/L (ref 39–117)
BUN: 29 mg/dL — AB (ref 6–23)
CO2: 25 meq/L (ref 19–32)
Calcium: 8.9 mg/dL (ref 8.4–10.5)
Chloride: 98 mEq/L (ref 96–112)
Creatinine, Ser: 0.97 mg/dL (ref 0.50–1.35)
GFR calc non Af Amer: 79 mL/min — ABNORMAL LOW (ref >90)
GLUCOSE: 184 mg/dL — AB (ref 70–99)
POTASSIUM: 5.5 meq/L — AB (ref 3.7–5.3)
Sodium: 138 mEq/L (ref 137–147)
TOTAL PROTEIN: 6.6 g/dL (ref 6.0–8.3)
Total Bilirubin: 0.2 mg/dL — ABNORMAL LOW (ref 0.3–1.2)

## 2013-08-11 LAB — BLOOD GAS, ARTERIAL
Acid-base deficit: 0.5 mmol/L (ref 0.0–2.0)
BICARBONATE: 23.2 meq/L (ref 20.0–24.0)
Drawn by: 330991
FIO2: 0.21 %
O2 SAT: 95.4 %
Patient temperature: 98.6
TCO2: 24.2 mmol/L (ref 0–100)
pCO2 arterial: 35.1 mmHg (ref 35.0–45.0)
pH, Arterial: 7.435 (ref 7.350–7.450)
pO2, Arterial: 79.6 mmHg — ABNORMAL LOW (ref 80.0–100.0)

## 2013-08-11 LAB — URINALYSIS, ROUTINE W REFLEX MICROSCOPIC
BILIRUBIN URINE: NEGATIVE
GLUCOSE, UA: NEGATIVE mg/dL
Hgb urine dipstick: NEGATIVE
KETONES UR: NEGATIVE mg/dL
LEUKOCYTES UA: NEGATIVE
NITRITE: NEGATIVE
PH: 5.5 (ref 5.0–8.0)
PROTEIN: NEGATIVE mg/dL
Specific Gravity, Urine: 1.02 (ref 1.005–1.030)
Urobilinogen, UA: 0.2 mg/dL (ref 0.0–1.0)

## 2013-08-11 LAB — HEMOGLOBIN A1C
Hgb A1c MFr Bld: 10.5 % — ABNORMAL HIGH (ref <5.7)
Mean Plasma Glucose: 255 mg/dL — ABNORMAL HIGH (ref <117)

## 2013-08-11 LAB — ABO/RH: ABO/RH(D): AB POS

## 2013-08-11 LAB — PROTIME-INR
INR: 1.05 (ref 0.00–1.49)
PROTHROMBIN TIME: 13.7 s (ref 11.6–15.2)

## 2013-08-11 LAB — CBC
HEMATOCRIT: 39.3 % (ref 39.0–52.0)
HEMOGLOBIN: 12.6 g/dL — AB (ref 13.0–17.0)
MCH: 27.6 pg (ref 26.0–34.0)
MCHC: 32.1 g/dL (ref 30.0–36.0)
MCV: 86.2 fL (ref 78.0–100.0)
Platelets: 203 10*3/uL (ref 150–400)
RBC: 4.56 MIL/uL (ref 4.22–5.81)
RDW: 16.2 % — ABNORMAL HIGH (ref 11.5–15.5)
WBC: 7.7 10*3/uL (ref 4.0–10.5)

## 2013-08-11 LAB — APTT: aPTT: 28 seconds (ref 24–37)

## 2013-08-11 LAB — SURGICAL PCR SCREEN
MRSA, PCR: NEGATIVE
Staphylococcus aureus: NEGATIVE

## 2013-08-11 NOTE — Pre-Procedure Instructions (Signed)
Joshua [name]  08/11/2013   Your procedure is scheduled on:  Monday, August 10  Report to [facility] Admitting at 0530 AM.  Call this number if you have problems the morning of surgery: [phone]   Remember:   Do not eat food or drink liquids after midnight._0 .  Place clean sheets on your bed the night of your first shower and do not  sleep with pets.  Day of Surgery  Do not apply any lotions/deoderants the morning of surgery.  Please wear clean clothes to the hospital/surgery center.     Please read over the following fact sheets that you were given: Pain Booklet, Coughing and Deep Breathing, Blood Transfusion Information, Open Heart Packet and Surgical Site Infection Prevention

## 2013-08-13 MED ORDER — PHENYLEPHRINE HCL 10 MG/ML IJ SOLN
30.0000 ug/min | INTRAVENOUS | Status: AC
  Administered 2013-08-14: 15 ug/min via INTRAVENOUS
  Filled 2013-08-13: qty 2

## 2013-08-13 MED ORDER — DEXMEDETOMIDINE HCL IN NACL 400 MCG/100ML IV SOLN
0.1000 ug/kg/h | INTRAVENOUS | Status: AC
  Administered 2013-08-14: .2 ug/kg/h via INTRAVENOUS
  Filled 2013-08-13 (×2): qty 100

## 2013-08-13 MED ORDER — POTASSIUM CHLORIDE 2 MEQ/ML IV SOLN
80.0000 meq | INTRAVENOUS | Status: DC
Start: 2013-08-14 — End: 2013-08-14
  Filled 2013-08-13: qty 40

## 2013-08-13 MED ORDER — MAGNESIUM SULFATE 50 % IJ SOLN
40.0000 meq | INTRAMUSCULAR | Status: DC
  Filled 2013-08-13: qty 10

## 2013-08-13 MED ORDER — PLASMA-LYTE 148 IV SOLN
INTRAVENOUS | Status: AC
  Administered 2013-08-14: 09:00:00
  Filled 2013-08-13: qty 2.5

## 2013-08-13 MED ORDER — EPINEPHRINE HCL 1 MG/ML IJ SOLN
0.5000 ug/min | INTRAVENOUS | Status: DC
  Filled 2013-08-13: qty 4

## 2013-08-13 MED ORDER — VANCOMYCIN HCL 10 G IV SOLR
1250.0000 mg | INTRAVENOUS | Status: AC
  Administered 2013-08-14: 1250 mg via INTRAVENOUS
  Filled 2013-08-13: qty 1250

## 2013-08-13 MED ORDER — SODIUM CHLORIDE 0.9 % IV SOLN
INTRAVENOUS | Status: AC
  Administered 2013-08-14: 69.8 mL/h via INTRAVENOUS
  Filled 2013-08-13: qty 40

## 2013-08-13 MED ORDER — METOPROLOL TARTRATE 12.5 MG HALF TABLET: 12.5000 mg | ORAL_TABLET | Freq: Once | ORAL | Status: DC

## 2013-08-13 MED ORDER — DEXTROSE 5 % IV SOLN
750.0000 mg | INTRAVENOUS | Status: DC
  Filled 2013-08-13: qty 750

## 2013-08-13 MED ORDER — CHLORHEXIDINE GLUCONATE 4 % EX LIQD
30.0000 mL | CUTANEOUS | Status: DC
  Filled 2013-08-13: qty 30

## 2013-08-13 MED ORDER — SODIUM CHLORIDE 0.9 % IV SOLN
INTRAVENOUS | Status: DC
  Filled 2013-08-13: qty 30

## 2013-08-13 MED ORDER — SODIUM CHLORIDE 0.9 % IV SOLN
INTRAVENOUS | Status: AC
  Administered 2013-08-14: 4 [IU]/h via INTRAVENOUS
  Filled 2013-08-13: qty 1

## 2013-08-13 MED ORDER — DEXTROSE 5 % IV SOLN
1.5000 g | INTRAVENOUS | Status: AC
  Administered 2013-08-14: 1.5 g via INTRAVENOUS
  Administered 2013-08-14: .75 g via INTRAVENOUS
  Filled 2013-08-13: qty 1.5

## 2013-08-13 MED ORDER — DOPAMINE-DEXTROSE 3.2-5 MG/ML-% IV SOLN
2.0000 ug/kg/min | INTRAVENOUS | Status: DC
  Filled 2013-08-13 (×2): qty 250

## 2013-08-13 MED ORDER — NITROGLYCERIN IN D5W 200-5 MCG/ML-% IV SOLN
2.0000 ug/min | INTRAVENOUS | Status: AC
Start: 2013-08-14 — End: 2013-08-14
  Administered 2013-08-14: 5 ug/min via INTRAVENOUS
  Filled 2013-08-13 (×2): qty 250

## 2013-08-14 ENCOUNTER — Encounter (HOSPITAL_COMMUNITY)
Admission: RE | Disposition: A | Discharge status: Home / Self Care | Payer: Commercial Managed Care - HMO | Source: Ambulatory Visit | Attending: Thoracic Surgery (Cardiothoracic Vascular Surgery)

## 2013-08-14 ENCOUNTER — Encounter (HOSPITAL_COMMUNITY): Payer: Medicare HMO | Admitting: Certified Registered"

## 2013-08-14 ENCOUNTER — Encounter (HOSPITAL_COMMUNITY): Payer: Self-pay | Admitting: *Deleted

## 2013-08-14 ENCOUNTER — Inpatient Hospital Stay (HOSPITAL_COMMUNITY): Payer: Medicare HMO | Admitting: Certified Registered"

## 2013-08-14 ENCOUNTER — Inpatient Hospital Stay (HOSPITAL_COMMUNITY): Payer: Medicare HMO

## 2013-08-14 ENCOUNTER — Inpatient Hospital Stay (HOSPITAL_COMMUNITY)
Admission: RE | Admit: 2013-08-14 | Discharge: 2013-08-19 | DRG: 236 | Disposition: A | Discharge status: Home / Self Care | Payer: Medicare HMO | Source: Ambulatory Visit | Attending: Thoracic Surgery (Cardiothoracic Vascular Surgery) | Admitting: Thoracic Surgery (Cardiothoracic Vascular Surgery)

## 2013-08-14 DIAGNOSIS — I2582 Chronic total occlusion of coronary artery: Secondary | ICD-10-CM | POA: Diagnosis present

## 2013-08-14 DIAGNOSIS — Z87891 Personal history of nicotine dependence: Secondary | ICD-10-CM

## 2013-08-14 DIAGNOSIS — N529 Male erectile dysfunction, unspecified: Secondary | ICD-10-CM | POA: Diagnosis present

## 2013-08-14 DIAGNOSIS — E785 Hyperlipidemia, unspecified: Secondary | ICD-10-CM | POA: Diagnosis present

## 2013-08-14 DIAGNOSIS — K2940 Chronic atrophic gastritis without bleeding: Secondary | ICD-10-CM | POA: Diagnosis present

## 2013-08-14 DIAGNOSIS — Z833 Family history of diabetes mellitus: Secondary | ICD-10-CM

## 2013-08-14 DIAGNOSIS — Z888 Allergy status to other drugs, medicaments and biological substances status: Secondary | ICD-10-CM | POA: Diagnosis not present

## 2013-08-14 DIAGNOSIS — E1169 Type 2 diabetes mellitus with other specified complication: Secondary | ICD-10-CM | POA: Diagnosis not present

## 2013-08-14 DIAGNOSIS — M1710 Unilateral primary osteoarthritis, unspecified knee: Secondary | ICD-10-CM | POA: Diagnosis present

## 2013-08-14 DIAGNOSIS — Z860100 Personal history of colon polyps, unspecified: Secondary | ICD-10-CM

## 2013-08-14 DIAGNOSIS — Z823 Family history of stroke: Secondary | ICD-10-CM

## 2013-08-14 DIAGNOSIS — Z79899 Other long term (current) drug therapy: Secondary | ICD-10-CM

## 2013-08-14 DIAGNOSIS — Z7982 Long term (current) use of aspirin: Secondary | ICD-10-CM | POA: Diagnosis not present

## 2013-08-14 DIAGNOSIS — J4489 Other specified chronic obstructive pulmonary disease: Secondary | ICD-10-CM | POA: Diagnosis present

## 2013-08-14 DIAGNOSIS — E669 Obesity, unspecified: Secondary | ICD-10-CM | POA: Diagnosis present

## 2013-08-14 DIAGNOSIS — M1610 Unilateral primary osteoarthritis, unspecified hip: Secondary | ICD-10-CM | POA: Diagnosis present

## 2013-08-14 DIAGNOSIS — Z803 Family history of malignant neoplasm of breast: Secondary | ICD-10-CM

## 2013-08-14 DIAGNOSIS — Z885 Allergy status to narcotic agent status: Secondary | ICD-10-CM | POA: Diagnosis not present

## 2013-08-14 DIAGNOSIS — H353190 Nonexudative age-related macular degeneration, unspecified eye, stage unspecified: Secondary | ICD-10-CM | POA: Diagnosis present

## 2013-08-14 DIAGNOSIS — I2510 Atherosclerotic heart disease of native coronary artery without angina pectoris: Principal | ICD-10-CM | POA: Diagnosis present

## 2013-08-14 DIAGNOSIS — N058 Unspecified nephritic syndrome with other morphologic changes: Secondary | ICD-10-CM | POA: Diagnosis present

## 2013-08-14 DIAGNOSIS — Z9849 Cataract extraction status, unspecified eye: Secondary | ICD-10-CM | POA: Diagnosis not present

## 2013-08-14 DIAGNOSIS — I2584 Coronary atherosclerosis due to calcified coronary lesion: Secondary | ICD-10-CM | POA: Diagnosis present

## 2013-08-14 DIAGNOSIS — Z961 Presence of intraocular lens: Secondary | ICD-10-CM

## 2013-08-14 DIAGNOSIS — Z951 Presence of aortocoronary bypass graft: Secondary | ICD-10-CM

## 2013-08-14 DIAGNOSIS — I129 Hypertensive chronic kidney disease with stage 1 through stage 4 chronic kidney disease, or unspecified chronic kidney disease: Secondary | ICD-10-CM | POA: Diagnosis present

## 2013-08-14 DIAGNOSIS — Z9089 Acquired absence of other organs: Secondary | ICD-10-CM

## 2013-08-14 DIAGNOSIS — Z794 Long term (current) use of insulin: Secondary | ICD-10-CM | POA: Diagnosis not present

## 2013-08-14 DIAGNOSIS — D62 Acute posthemorrhagic anemia: Secondary | ICD-10-CM | POA: Diagnosis not present

## 2013-08-14 DIAGNOSIS — E876 Hypokalemia: Secondary | ICD-10-CM | POA: Diagnosis not present

## 2013-08-14 DIAGNOSIS — N181 Chronic kidney disease, stage 1: Secondary | ICD-10-CM | POA: Diagnosis present

## 2013-08-14 DIAGNOSIS — J449 Chronic obstructive pulmonary disease, unspecified: Secondary | ICD-10-CM | POA: Diagnosis present

## 2013-08-14 DIAGNOSIS — Z6828 Body mass index (BMI) 28.0-28.9, adult: Secondary | ICD-10-CM | POA: Diagnosis not present

## 2013-08-14 DIAGNOSIS — Z8249 Family history of ischemic heart disease and other diseases of the circulatory system: Secondary | ICD-10-CM

## 2013-08-14 DIAGNOSIS — IMO0002 Reserved for concepts with insufficient information to code with codable children: Secondary | ICD-10-CM

## 2013-08-14 DIAGNOSIS — E1129 Type 2 diabetes mellitus with other diabetic kidney complication: Secondary | ICD-10-CM | POA: Diagnosis present

## 2013-08-14 DIAGNOSIS — Z96649 Presence of unspecified artificial hip joint: Secondary | ICD-10-CM

## 2013-08-14 HISTORY — PX: CORONARY ARTERY BYPASS GRAFT: SHX141

## 2013-08-14 HISTORY — PX: INTRAOPERATIVE TRANSESOPHAGEAL ECHOCARDIOGRAM: SHX5062

## 2013-08-14 LAB — POCT I-STAT 3, ART BLOOD GAS (G3+)
ACID-BASE DEFICIT: 2 mmol/L (ref 0.0–2.0)
ACID-BASE DEFICIT: 3 mmol/L — AB (ref 0.0–2.0)
Acid-base deficit: 1 mmol/L (ref 0.0–2.0)
Acid-base deficit: 3 mmol/L — ABNORMAL HIGH (ref 0.0–2.0)
Bicarbonate: 24.9 mEq/L — ABNORMAL HIGH (ref 20.0–24.0)
Bicarbonate: 23.8 mEq/L (ref 20.0–24.0)
Bicarbonate: 22.8 mEq/L (ref 20.0–24.0)
Bicarbonate: 22.4 mEq/L (ref 20.0–24.0)
O2 SAT: 100 %
O2 SAT: 98 %
O2 SAT: 99 %
O2 Saturation: 95 %
PCO2 ART: 49 mmHg — AB (ref 35.0–45.0)
PO2 ART: 286 mmHg — AB (ref 80.0–100.0)
PO2 ART: 124 mmHg — AB (ref 80.0–100.0)
TCO2: 26 mmol/L (ref 0–100)
TCO2: 25 mmol/L (ref 0–100)
TCO2: 24 mmol/L (ref 0–100)
TCO2: 24 mmol/L (ref 0–100)
pCO2 arterial: 50.9 mmHg — ABNORMAL HIGH (ref 35.0–45.0)
pCO2 arterial: 39.1 mmHg (ref 35.0–45.0)
pCO2 arterial: 39.2 mmHg (ref 35.0–45.0)
pH, Arterial: 7.314 — ABNORMAL LOW (ref 7.350–7.450)
pH, Arterial: 7.278 — ABNORMAL LOW (ref 7.350–7.450)
pH, Arterial: 7.375 (ref 7.350–7.450)
pH, Arterial: 7.364 (ref 7.350–7.450)
pO2, Arterial: 127 mmHg — ABNORMAL HIGH (ref 80.0–100.0)
pO2, Arterial: 81 mmHg (ref 80.0–100.0)

## 2013-08-14 LAB — POCT I-STAT, CHEM 8
BUN: 26 mg/dL — ABNORMAL HIGH (ref 6–23)
BUN: 22 mg/dL (ref 6–23)
BUN: 24 mg/dL — AB (ref 6–23)
BUN: 22 mg/dL (ref 6–23)
BUN: 22 mg/dL (ref 6–23)
BUN: 22 mg/dL (ref 6–23)
CALCIUM ION: 1.36 mmol/L — AB (ref 1.13–1.30)
CALCIUM ION: 1.19 mmol/L (ref 1.13–1.30)
CHLORIDE: 101 meq/L (ref 96–112)
CHLORIDE: 100 meq/L (ref 96–112)
CREATININE: 0.5 mg/dL (ref 0.50–1.35)
CREATININE: 0.5 mg/dL (ref 0.50–1.35)
CREATININE: 0.6 mg/dL (ref 0.50–1.35)
CREATININE: 0.7 mg/dL (ref 0.50–1.35)
Calcium, Ion: 1.18 mmol/L (ref 1.13–1.30)
Calcium, Ion: 1.02 mmol/L — ABNORMAL LOW (ref 1.13–1.30)
Calcium, Ion: 1.19 mmol/L (ref 1.13–1.30)
Calcium, Ion: 0.98 mmol/L — ABNORMAL LOW (ref 1.13–1.30)
Chloride: 102 mEq/L (ref 96–112)
Chloride: 97 mEq/L (ref 96–112)
Chloride: 101 mEq/L (ref 96–112)
Chloride: 104 mEq/L (ref 96–112)
Creatinine, Ser: 0.6 mg/dL (ref 0.50–1.35)
Creatinine, Ser: 0.5 mg/dL (ref 0.50–1.35)
GLUCOSE: 152 mg/dL — AB (ref 70–99)
GLUCOSE: 182 mg/dL — AB (ref 70–99)
GLUCOSE: 148 mg/dL — AB (ref 70–99)
Glucose, Bld: 194 mg/dL — ABNORMAL HIGH (ref 70–99)
Glucose, Bld: 142 mg/dL — ABNORMAL HIGH (ref 70–99)
Glucose, Bld: 172 mg/dL — ABNORMAL HIGH (ref 70–99)
HCT: 30 % — ABNORMAL LOW (ref 39.0–52.0)
HCT: 21 % — ABNORMAL LOW (ref 39.0–52.0)
HCT: 28 % — ABNORMAL LOW (ref 39.0–52.0)
HCT: 20 % — ABNORMAL LOW (ref 39.0–52.0)
HCT: 30 % — ABNORMAL LOW (ref 39.0–52.0)
HEMATOCRIT: 22 % — AB (ref 39.0–52.0)
HEMOGLOBIN: 10.2 g/dL — AB (ref 13.0–17.0)
Hemoglobin: 10.2 g/dL — ABNORMAL LOW (ref 13.0–17.0)
Hemoglobin: 7.1 g/dL — ABNORMAL LOW (ref 13.0–17.0)
Hemoglobin: 9.5 g/dL — ABNORMAL LOW (ref 13.0–17.0)
Hemoglobin: 6.8 g/dL — CL (ref 13.0–17.0)
Hemoglobin: 7.5 g/dL — ABNORMAL LOW (ref 13.0–17.0)
POTASSIUM: 3.5 meq/L — AB (ref 3.7–5.3)
POTASSIUM: 4.7 meq/L (ref 3.7–5.3)
Potassium: 4 mEq/L (ref 3.7–5.3)
Potassium: 3.7 mEq/L (ref 3.7–5.3)
Potassium: 3.7 mEq/L (ref 3.7–5.3)
Potassium: 4.4 mEq/L (ref 3.7–5.3)
Sodium: 137 mEq/L (ref 137–147)
Sodium: 135 mEq/L — ABNORMAL LOW (ref 137–147)
Sodium: 137 mEq/L (ref 137–147)
Sodium: 130 mEq/L — ABNORMAL LOW (ref 137–147)
Sodium: 134 mEq/L — ABNORMAL LOW (ref 137–147)
Sodium: 137 mEq/L (ref 137–147)
TCO2: 24 mmol/L (ref 0–100)
TCO2: 23 mmol/L (ref 0–100)
TCO2: 23 mmol/L (ref 0–100)
TCO2: 22 mmol/L (ref 0–100)
TCO2: 23 mmol/L (ref 0–100)
TCO2: 21 mmol/L (ref 0–100)

## 2013-08-14 LAB — CBC
HCT: 30.8 % — ABNORMAL LOW (ref 39.0–52.0)
HCT: 31.7 % — ABNORMAL LOW (ref 39.0–52.0)
HEMOGLOBIN: 10.3 g/dL — AB (ref 13.0–17.0)
Hemoglobin: 10 g/dL — ABNORMAL LOW (ref 13.0–17.0)
MCH: 27.7 pg (ref 26.0–34.0)
MCH: 27.6 pg (ref 26.0–34.0)
MCHC: 32.5 g/dL (ref 30.0–36.0)
MCHC: 32.5 g/dL (ref 30.0–36.0)
MCV: 85.3 fL (ref 78.0–100.0)
MCV: 85 fL (ref 78.0–100.0)
PLATELETS: 154 10*3/uL (ref 150–400)
PLATELETS: 191 10*3/uL (ref 150–400)
RBC: 3.61 MIL/uL — ABNORMAL LOW (ref 4.22–5.81)
RBC: 3.73 MIL/uL — AB (ref 4.22–5.81)
RDW: 15.9 % — AB (ref 11.5–15.5)
RDW: 16 % — ABNORMAL HIGH (ref 11.5–15.5)
WBC: 8.6 10*3/uL (ref 4.0–10.5)
WBC: 10.5 10*3/uL (ref 4.0–10.5)

## 2013-08-14 LAB — GLUCOSE, CAPILLARY
GLUCOSE-CAPILLARY: 175 mg/dL — AB (ref 70–99)
GLUCOSE-CAPILLARY: 93 mg/dL (ref 70–99)
GLUCOSE-CAPILLARY: 153 mg/dL — AB (ref 70–99)
GLUCOSE-CAPILLARY: 138 mg/dL — AB (ref 70–99)
Glucose-Capillary: 60 mg/dL — ABNORMAL LOW (ref 70–99)
Glucose-Capillary: 95 mg/dL (ref 70–99)
Glucose-Capillary: 128 mg/dL — ABNORMAL HIGH (ref 70–99)
Glucose-Capillary: 139 mg/dL — ABNORMAL HIGH (ref 70–99)
Glucose-Capillary: 156 mg/dL — ABNORMAL HIGH (ref 70–99)

## 2013-08-14 LAB — CREATININE, SERUM
Creatinine, Ser: 0.59 mg/dL (ref 0.50–1.35)
GFR calc Af Amer: >90 mL/min (ref >90)

## 2013-08-14 LAB — HEMOGLOBIN AND HEMATOCRIT, BLOOD
HCT: 22.2 % — ABNORMAL LOW (ref 39.0–52.0)
Hemoglobin: 7.4 g/dL — ABNORMAL LOW (ref 13.0–17.0)

## 2013-08-14 LAB — POCT I-STAT 4, (NA,K, GLUC, HGB,HCT)
Glucose, Bld: 115 mg/dL — ABNORMAL HIGH (ref 70–99)
HCT: 28 % — ABNORMAL LOW (ref 39.0–52.0)
Hemoglobin: 9.5 g/dL — ABNORMAL LOW (ref 13.0–17.0)
POTASSIUM: 3.8 meq/L (ref 3.7–5.3)
Sodium: 137 mEq/L (ref 137–147)

## 2013-08-14 LAB — PROTIME-INR
INR: 1.4 (ref 0.00–1.49)
Prothrombin Time: 17.2 seconds — ABNORMAL HIGH (ref 11.6–15.2)

## 2013-08-14 LAB — PREPARE RBC (CROSSMATCH)

## 2013-08-14 LAB — MAGNESIUM: Magnesium: 3.3 mg/dL — ABNORMAL HIGH (ref 1.5–2.5)

## 2013-08-14 LAB — PLATELET COUNT: PLATELETS: 127 10*3/uL — AB (ref 150–400)

## 2013-08-14 LAB — APTT: APTT: 31 s (ref 24–37)

## 2013-08-14 SURGERY — CORONARY ARTERY BYPASS GRAFTING (CABG)
Anesthesia: General | Site: Chest

## 2013-08-14 MED ORDER — FAMOTIDINE IN NACL 20-0.9 MG/50ML-% IV SOLN
20.0000 mg | Freq: Two times a day (BID) | INTRAVENOUS | Status: AC
  Administered 2013-08-14: 20 mg via INTRAVENOUS

## 2013-08-14 MED ORDER — SUCCINYLCHOLINE CHLORIDE 20 MG/ML IJ SOLN
INTRAMUSCULAR | Status: AC
  Filled 2013-08-14: qty 1

## 2013-08-14 MED ORDER — LACTATED RINGERS IV SOLN
INTRAVENOUS | Status: DC | PRN
  Administered 2013-08-14 (×3): via INTRAVENOUS

## 2013-08-14 MED ORDER — FENTANYL CITRATE 0.05 MG/ML IJ SOLN
INTRAMUSCULAR | Status: AC
  Filled 2013-08-14: qty 5

## 2013-08-14 MED ORDER — VECURONIUM BROMIDE 10 MG IV SOLR
INTRAVENOUS | Status: DC | PRN
  Administered 2013-08-14 (×4): 2 mg via INTRAVENOUS

## 2013-08-14 MED ORDER — BISACODYL 10 MG RE SUPP: 10.0000 mg | Freq: Every day | RECTAL | Status: DC

## 2013-08-14 MED ORDER — DEXTROSE 5 % IV SOLN
1.5000 g | Freq: Two times a day (BID) | INTRAVENOUS | Status: AC
  Administered 2013-08-14 – 2013-08-16 (×4): 1.5 g via INTRAVENOUS
  Filled 2013-08-14 (×4): qty 1.5

## 2013-08-14 MED ORDER — INSULIN REGULAR BOLUS VIA INFUSION
0.0000 [IU] | Freq: Three times a day (TID) | INTRAVENOUS | Status: DC
  Administered 2013-08-15: 4.1 [IU] via INTRAVENOUS
  Filled 2013-08-14: qty 10

## 2013-08-14 MED ORDER — ASPIRIN EC 325 MG PO TBEC
325.0000 mg | DELAYED_RELEASE_TABLET | Freq: Every day | ORAL | Status: DC
  Administered 2013-08-15 – 2013-08-18 (×4): 325 mg via ORAL
  Filled 2013-08-14 (×5): qty 1

## 2013-08-14 MED ORDER — PROTAMINE SULFATE 10 MG/ML IV SOLN
INTRAVENOUS | Status: AC
  Filled 2013-08-14: qty 5

## 2013-08-14 MED ORDER — ACETAMINOPHEN 650 MG RE SUPP
650.0000 mg | Freq: Once | RECTAL | Status: AC
  Administered 2013-08-14: 650 mg via RECTAL

## 2013-08-14 MED ORDER — HEPARIN SODIUM (PORCINE) 1000 UNIT/ML IJ SOLN
INTRAMUSCULAR | Status: AC
  Filled 2013-08-14: qty 1

## 2013-08-14 MED ORDER — DEXTROSE 5 % IV SOLN
0.0000 ug/min | INTRAVENOUS | Status: DC
  Administered 2013-08-14: 50 ug/min via INTRAVENOUS
  Administered 2013-08-14: 40 ug/min via INTRAVENOUS
  Filled 2013-08-14: qty 2

## 2013-08-14 MED ORDER — MIDAZOLAM HCL 10 MG/2ML IJ SOLN
INTRAMUSCULAR | Status: AC
  Filled 2013-08-14: qty 2

## 2013-08-14 MED ORDER — ACETAMINOPHEN 500 MG PO TABS
1000.0000 mg | ORAL_TABLET | Freq: Four times a day (QID) | ORAL | Status: DC
  Administered 2013-08-15 – 2013-08-19 (×15): 1000 mg via ORAL
  Filled 2013-08-14 (×21): qty 2

## 2013-08-14 MED ORDER — ROCURONIUM BROMIDE 50 MG/5ML IV SOLN
INTRAVENOUS | Status: AC
  Filled 2013-08-14: qty 2

## 2013-08-14 MED ORDER — VANCOMYCIN HCL IN DEXTROSE 1-5 GM/200ML-% IV SOLN
1000.0000 mg | Freq: Once | INTRAVENOUS | Status: AC
  Administered 2013-08-14: 1000 mg via INTRAVENOUS
  Filled 2013-08-14: qty 200

## 2013-08-14 MED ORDER — SODIUM CHLORIDE 0.9 % IJ SOLN
INTRAMUSCULAR | Status: AC
  Filled 2013-08-14: qty 20

## 2013-08-14 MED ORDER — POTASSIUM CHLORIDE 10 MEQ/50ML IV SOLN
10.0000 meq | INTRAVENOUS | Status: AC
  Administered 2013-08-14 (×3): 10 meq via INTRAVENOUS

## 2013-08-14 MED ORDER — PROTAMINE SULFATE 10 MG/ML IV SOLN
INTRAVENOUS | Status: AC
  Filled 2013-08-14: qty 25

## 2013-08-14 MED ORDER — ASPIRIN 81 MG PO CHEW: 324.0000 mg | CHEWABLE_TABLET | Freq: Every day | ORAL | Status: DC

## 2013-08-14 MED ORDER — SODIUM CHLORIDE 0.9 % IV SOLN: 250.0000 mL | INTRAVENOUS | Status: DC

## 2013-08-14 MED ORDER — MIDAZOLAM HCL 5 MG/5ML IJ SOLN
INTRAMUSCULAR | Status: DC | PRN
  Administered 2013-08-14 (×5): 2 mg via INTRAVENOUS

## 2013-08-14 MED ORDER — BUDESONIDE-FORMOTEROL FUMARATE 160-4.5 MCG/ACT IN AERO
2.0000 | INHALATION_SPRAY | Freq: Two times a day (BID) | RESPIRATORY_TRACT | Status: DC
  Administered 2013-08-14 – 2013-08-19 (×9): 2 via RESPIRATORY_TRACT
  Filled 2013-08-14 (×2): qty 6

## 2013-08-14 MED ORDER — HEPARIN SODIUM (PORCINE) 1000 UNIT/ML IJ SOLN
INTRAMUSCULAR | Status: DC | PRN
  Administered 2013-08-14: 33000 [IU] via INTRAVENOUS
  Administered 2013-08-14: 2000 [IU] via INTRAVENOUS

## 2013-08-14 MED ORDER — VITAMIN D-3 25 MCG (1000 UT) PO CAPS
1000.0000 [IU] | ORAL_CAPSULE | Freq: Every day | ORAL | Status: DC
  Administered 2013-08-15: 1000 [IU] via ORAL
  Filled 2013-08-14 (×3): qty 1

## 2013-08-14 MED ORDER — FENTANYL CITRATE 0.05 MG/ML IJ SOLN
INTRAMUSCULAR | Status: DC | PRN
  Administered 2013-08-14: 50 ug via INTRAVENOUS
  Administered 2013-08-14: 900 ug via INTRAVENOUS
  Administered 2013-08-14 (×2): 100 ug via INTRAVENOUS
  Administered 2013-08-14: 150 ug via INTRAVENOUS
  Administered 2013-08-14 (×2): 100 ug via INTRAVENOUS

## 2013-08-14 MED ORDER — METOCLOPRAMIDE HCL 5 MG/ML IJ SOLN
10.0000 mg | Freq: Four times a day (QID) | INTRAMUSCULAR | Status: AC
  Administered 2013-08-14 – 2013-08-15 (×4): 10 mg via INTRAVENOUS
  Filled 2013-08-14 (×4): qty 2

## 2013-08-14 MED ORDER — DOCUSATE SODIUM 100 MG PO CAPS
200.0000 mg | ORAL_CAPSULE | Freq: Every day | ORAL | Status: DC
  Administered 2013-08-15 – 2013-08-18 (×3): 200 mg via ORAL
  Filled 2013-08-14 (×4): qty 2

## 2013-08-14 MED ORDER — PROPOFOL 10 MG/ML IV BOLUS
INTRAVENOUS | Status: AC
  Filled 2013-08-14: qty 20

## 2013-08-14 MED ORDER — DEXMEDETOMIDINE HCL IN NACL 200 MCG/50ML IV SOLN
INTRAVENOUS | Status: AC
  Filled 2013-08-14: qty 50

## 2013-08-14 MED ORDER — SODIUM CHLORIDE 0.9 % IJ SOLN
OROMUCOSAL | Status: DC | PRN
  Administered 2013-08-14 (×2): via TOPICAL

## 2013-08-14 MED ORDER — CALCIUM CHLORIDE 10 % IV SOLN
INTRAVENOUS | Status: AC
  Filled 2013-08-14: qty 10

## 2013-08-14 MED ORDER — ROCURONIUM BROMIDE 100 MG/10ML IV SOLN
INTRAVENOUS | Status: DC | PRN
  Administered 2013-08-14: 40 mg via INTRAVENOUS
  Administered 2013-08-14: 60 mg via INTRAVENOUS

## 2013-08-14 MED ORDER — ALBUMIN HUMAN 5 % IV SOLN
250.0000 mL | INTRAVENOUS | Status: AC | PRN
  Administered 2013-08-14: 250 mL via INTRAVENOUS

## 2013-08-14 MED ORDER — 0.9 % SODIUM CHLORIDE (POUR BTL) OPTIME
TOPICAL | Status: DC | PRN
  Administered 2013-08-14: 1000 mL

## 2013-08-14 MED ORDER — CALCIUM CHLORIDE 10 % IV SOLN
INTRAVENOUS | Status: DC | PRN
  Administered 2013-08-14: 200 mg via INTRAVENOUS
  Administered 2013-08-14: 500 mg via INTRAVENOUS
  Administered 2013-08-14: 300 mg via INTRAVENOUS

## 2013-08-14 MED ORDER — PROPOFOL 10 MG/ML IV BOLUS
INTRAVENOUS | Status: DC | PRN
  Administered 2013-08-14: 30 mg via INTRAVENOUS
  Administered 2013-08-14: 70 mg via INTRAVENOUS

## 2013-08-14 MED ORDER — MIDAZOLAM HCL 2 MG/2ML IJ SOLN: 2.0000 mg | INTRAMUSCULAR | Status: DC | PRN

## 2013-08-14 MED ORDER — SODIUM CHLORIDE 0.45 % IV SOLN: INTRAVENOUS | Status: DC

## 2013-08-14 MED ORDER — PANTOPRAZOLE SODIUM 40 MG PO TBEC
40.0000 mg | DELAYED_RELEASE_TABLET | Freq: Every day | ORAL | Status: DC
  Administered 2013-08-16 – 2013-08-18 (×3): 40 mg via ORAL
  Filled 2013-08-14 (×2): qty 1

## 2013-08-14 MED ORDER — NITROGLYCERIN IN D5W 200-5 MCG/ML-% IV SOLN: 0.0000 ug/min | INTRAVENOUS | Status: DC

## 2013-08-14 MED ORDER — METOPROLOL TARTRATE 1 MG/ML IV SOLN: 2.5000 mg | INTRAVENOUS | Status: DC | PRN

## 2013-08-14 MED ORDER — OXYCODONE HCL 5 MG PO TABS
5.0000 mg | ORAL_TABLET | ORAL | Status: DC | PRN
  Administered 2013-08-14 – 2013-08-16 (×3): 5 mg via ORAL
  Filled 2013-08-14 (×3): qty 1

## 2013-08-14 MED ORDER — DEXTROSE 50 % IV SOLN
INTRAVENOUS | Status: AC
  Administered 2013-08-14: 16 mL
  Filled 2013-08-14: qty 50

## 2013-08-14 MED ORDER — CETYLPYRIDINIUM CHLORIDE 0.05 % MT LIQD
7.0000 mL | Freq: Two times a day (BID) | OROMUCOSAL | Status: DC
  Administered 2013-08-14 – 2013-08-18 (×7): 7 mL via OROMUCOSAL

## 2013-08-14 MED ORDER — DEXMEDETOMIDINE HCL IN NACL 200 MCG/50ML IV SOLN
0.1000 ug/kg/h | INTRAVENOUS | Status: DC
  Administered 2013-08-14: 0.7 ug/kg/h via INTRAVENOUS

## 2013-08-14 MED ORDER — LACTATED RINGERS IV SOLN: 500.0000 mL | Freq: Once | INTRAVENOUS | Status: AC | PRN

## 2013-08-14 MED ORDER — SODIUM CHLORIDE 0.9 % IJ SOLN: 3.0000 mL | INTRAMUSCULAR | Status: DC | PRN

## 2013-08-14 MED ORDER — HYDROCORTISONE NA SUCCINATE PF 100 MG IJ SOLR
100.0000 mg | Freq: Three times a day (TID) | INTRAMUSCULAR | Status: AC
  Administered 2013-08-14 – 2013-08-15 (×3): 100 mg via INTRAVENOUS
  Filled 2013-08-14 (×3): qty 2

## 2013-08-14 MED ORDER — ACETAMINOPHEN 160 MG/5ML PO SOLN: 650.0000 mg | Freq: Once | ORAL | Status: AC

## 2013-08-14 MED ORDER — ARTIFICIAL TEARS OP OINT
TOPICAL_OINTMENT | OPHTHALMIC | Status: DC | PRN
Start: 2013-08-14 — End: 2013-08-14
  Administered 2013-08-14: 1 via OPHTHALMIC

## 2013-08-14 MED ORDER — CHLORHEXIDINE GLUCONATE 0.12 % MT SOLN
15.0000 mL | Freq: Once | OROMUCOSAL | Status: AC
  Administered 2013-08-14: 15 mL via OROMUCOSAL
  Filled 2013-08-14: qty 15

## 2013-08-14 MED ORDER — BISACODYL 5 MG PO TBEC
10.0000 mg | DELAYED_RELEASE_TABLET | Freq: Every day | ORAL | Status: DC
  Administered 2013-08-15 – 2013-08-16 (×2): 10 mg via ORAL
  Filled 2013-08-14 (×2): qty 2

## 2013-08-14 MED ORDER — LEVALBUTEROL HCL 0.63 MG/3ML IN NEBU
0.6300 mg | INHALATION_SOLUTION | Freq: Four times a day (QID) | RESPIRATORY_TRACT | Status: DC
  Administered 2013-08-14 – 2013-08-19 (×18): 0.63 mg via RESPIRATORY_TRACT
  Filled 2013-08-14 (×31): qty 3

## 2013-08-14 MED ORDER — PROTAMINE SULFATE 10 MG/ML IV SOLN
INTRAVENOUS | Status: DC | PRN
  Administered 2013-08-14 (×3): 50 mg via INTRAVENOUS
  Administered 2013-08-14: 20 mg via INTRAVENOUS
  Administered 2013-08-14: 30 mg via INTRAVENOUS
  Administered 2013-08-14: 50 mg via INTRAVENOUS

## 2013-08-14 MED ORDER — STERILE WATER FOR INJECTION IJ SOLN
INTRAMUSCULAR | Status: AC
  Filled 2013-08-14: qty 10

## 2013-08-14 MED ORDER — PHENYLEPHRINE 40 MCG/ML (10ML) SYRINGE FOR IV PUSH (FOR BLOOD PRESSURE SUPPORT)
PREFILLED_SYRINGE | INTRAVENOUS | Status: AC
  Filled 2013-08-14: qty 10

## 2013-08-14 MED ORDER — LIDOCAINE HCL (CARDIAC) 20 MG/ML IV SOLN
INTRAVENOUS | Status: AC
  Filled 2013-08-14: qty 5

## 2013-08-14 MED ORDER — SIMVASTATIN 40 MG PO TABS
40.0000 mg | ORAL_TABLET | Freq: Every day | ORAL | Status: DC
  Administered 2013-08-15 – 2013-08-18 (×4): 40 mg via ORAL
  Filled 2013-08-14 (×6): qty 1

## 2013-08-14 MED ORDER — ACETAMINOPHEN 160 MG/5ML PO SOLN
1000.0000 mg | Freq: Four times a day (QID) | ORAL | Status: DC
  Administered 2013-08-16: 1000 mg
  Filled 2013-08-14: qty 40

## 2013-08-14 MED ORDER — TAMSULOSIN HCL 0.4 MG PO CAPS
0.4000 mg | ORAL_CAPSULE | Freq: Every day | ORAL | Status: DC
  Administered 2013-08-15 – 2013-08-18 (×4): 0.4 mg via ORAL
  Filled 2013-08-14 (×6): qty 1

## 2013-08-14 MED ORDER — DEXTROSE 50 % IV SOLN
50.0000 mL | Freq: Once | INTRAVENOUS | Status: AC
  Administered 2013-08-14: 16 mL via INTRAVENOUS
  Filled 2013-08-14: qty 50

## 2013-08-14 MED ORDER — NOREPINEPHRINE BITARTRATE 1 MG/ML IV SOLN
2.0000 ug/min | INTRAVENOUS | Status: DC
  Administered 2013-08-14: 10 ug/min via INTRAVENOUS
  Filled 2013-08-14: qty 4

## 2013-08-14 MED ORDER — HEMOSTATIC AGENTS (NO CHARGE) OPTIME
TOPICAL | Status: DC | PRN
  Administered 2013-08-14: 1 via TOPICAL

## 2013-08-14 MED ORDER — SODIUM CHLORIDE 0.9 % IJ SOLN
3.0000 mL | Freq: Two times a day (BID) | INTRAMUSCULAR | Status: DC
  Administered 2013-08-15 – 2013-08-17 (×3): 3 mL via INTRAVENOUS
  Administered 2013-08-18: 11:00:00 via INTRAVENOUS

## 2013-08-14 MED ORDER — VECURONIUM BROMIDE 10 MG IV SOLR
INTRAVENOUS | Status: AC
  Filled 2013-08-14: qty 10

## 2013-08-14 MED ORDER — METOPROLOL TARTRATE 12.5 MG HALF TABLET
12.5000 mg | ORAL_TABLET | Freq: Two times a day (BID) | ORAL | Status: DC
  Administered 2013-08-16 – 2013-08-18 (×6): 12.5 mg via ORAL
  Filled 2013-08-14 (×11): qty 1

## 2013-08-14 MED ORDER — VITAMIN D3 25 MCG (1000 UNIT) PO TABS
1000.0000 [IU] | ORAL_TABLET | Freq: Every day | ORAL | Status: DC
  Administered 2013-08-16 – 2013-08-18 (×3): 1000 [IU] via ORAL
  Filled 2013-08-14 (×7): qty 1

## 2013-08-14 MED ORDER — ONDANSETRON HCL 4 MG/2ML IJ SOLN: 4.0000 mg | Freq: Four times a day (QID) | INTRAMUSCULAR | Status: DC | PRN

## 2013-08-14 MED ORDER — MAGNESIUM SULFATE 4000MG/100ML IJ SOLN
4.0000 g | Freq: Once | INTRAMUSCULAR | Status: AC
  Administered 2013-08-14: 4 g via INTRAVENOUS
  Filled 2013-08-14: qty 100

## 2013-08-14 MED ORDER — BUPROPION HCL ER (SR) 150 MG PO TB12
150.0000 mg | ORAL_TABLET | Freq: Two times a day (BID) | ORAL | Status: DC
  Administered 2013-08-14 – 2013-08-18 (×9): 150 mg via ORAL
  Filled 2013-08-14 (×12): qty 1

## 2013-08-14 MED ORDER — SODIUM CHLORIDE 0.9 % IV SOLN
INTRAVENOUS | Status: DC | PRN
  Administered 2013-08-14: 13:00:00 via INTRAVENOUS

## 2013-08-14 MED ORDER — LACTATED RINGERS IV SOLN: INTRAVENOUS | Status: DC

## 2013-08-14 MED ORDER — MORPHINE SULFATE 2 MG/ML IJ SOLN
2.0000 mg | INTRAMUSCULAR | Status: DC | PRN
  Administered 2013-08-14 – 2013-08-15 (×2): 2 mg via INTRAVENOUS
  Filled 2013-08-14 (×2): qty 1

## 2013-08-14 MED ORDER — HYDROCORTISONE NA SUCCINATE PF 100 MG IJ SOLR
50.0000 mg | Freq: Two times a day (BID) | INTRAMUSCULAR | Status: DC
  Administered 2013-08-15 – 2013-08-16 (×2): 50 mg via INTRAVENOUS
  Filled 2013-08-14 (×4): qty 1

## 2013-08-14 MED ORDER — MORPHINE SULFATE 2 MG/ML IJ SOLN: 1.0000 mg | INTRAMUSCULAR | Status: AC | PRN

## 2013-08-14 MED ORDER — FERROUS SULFATE 325 (65 FE) MG PO TABS
325.0000 mg | ORAL_TABLET | Freq: Every day | ORAL | Status: DC
  Administered 2013-08-15 – 2013-08-18 (×4): 325 mg via ORAL
  Filled 2013-08-14 (×6): qty 1

## 2013-08-14 MED ORDER — METOPROLOL TARTRATE 25 MG/10 ML ORAL SUSPENSION
12.5000 mg | Freq: Two times a day (BID) | ORAL | Status: DC
  Filled 2013-08-14 (×5): qty 5

## 2013-08-14 MED ORDER — ARTIFICIAL TEARS OP OINT
TOPICAL_OINTMENT | OPHTHALMIC | Status: AC
  Filled 2013-08-14: qty 3.5

## 2013-08-14 MED ORDER — ALBUMIN HUMAN 5 % IV SOLN
INTRAVENOUS | Status: DC | PRN
  Administered 2013-08-14 (×2): via INTRAVENOUS

## 2013-08-14 MED ORDER — POTASSIUM CHLORIDE 10 MEQ/50ML IV SOLN
10.0000 meq | Freq: Once | INTRAVENOUS | Status: AC
  Administered 2013-08-14: 10 meq via INTRAVENOUS

## 2013-08-14 MED ORDER — SODIUM BICARBONATE 8.4 % IV SOLN
50.0000 meq | Freq: Once | INTRAVENOUS | Status: AC
  Administered 2013-08-14: 50 meq via INTRAVENOUS

## 2013-08-14 MED ORDER — SODIUM CHLORIDE 0.9 % IV SOLN
INTRAVENOUS | Status: DC
  Administered 2013-08-14: 1.6 [IU]/h via INTRAVENOUS
  Administered 2013-08-14: 3.9 [IU]/h via INTRAVENOUS
  Filled 2013-08-14: qty 1

## 2013-08-14 MED ORDER — SODIUM CHLORIDE 0.9 % IV SOLN
INTRAVENOUS | Status: DC
  Administered 2013-08-14: 10 mL/h via INTRAVENOUS

## 2013-08-14 MED ORDER — EPHEDRINE SULFATE 50 MG/ML IJ SOLN
INTRAMUSCULAR | Status: AC
  Filled 2013-08-14: qty 1

## 2013-08-14 SURGICAL SUPPLY — 99 items
ADH SKN CLS APL DERMABOND .7 (GAUZE/BANDAGES/DRESSINGS) ×4
ATTRACTOMAT 16X20 MAGNETIC DRP (DRAPES) ×4 IMPLANT
BAG DECANTER FOR FLEXI CONT (MISCELLANEOUS) ×4 IMPLANT
BANDAGE ELASTIC 4 VELCRO ST LF (GAUZE/BANDAGES/DRESSINGS) ×6 IMPLANT
BANDAGE ELASTIC 6 VELCRO ST LF (GAUZE/BANDAGES/DRESSINGS) ×6 IMPLANT
BASKET HEART  (ORDER IN 25'S) (MISCELLANEOUS) ×1
BASKET HEART (ORDER IN 25'S) (MISCELLANEOUS) ×1
BASKET HEART (ORDER IN 25S) (MISCELLANEOUS) ×2 IMPLANT
BLADE STERNUM SYSTEM 6 (BLADE) ×4 IMPLANT
BLADE SURG 11 STRL SS (BLADE) ×2 IMPLANT
BNDG GAUZE ELAST 4 BULKY (GAUZE/BANDAGES/DRESSINGS) ×6 IMPLANT
CANISTER SUCTION 2500CC (MISCELLANEOUS) ×4 IMPLANT
CANNULA EZ GLIDE AORTIC 21FR (CANNULA) ×4 IMPLANT
CARDIAC SUCTION (MISCELLANEOUS) ×4 IMPLANT
CATH CPB KIT HENDRICKSON (MISCELLANEOUS) ×4 IMPLANT
CATH ROBINSON RED A/P 18FR (CATHETERS) ×4 IMPLANT
CATH THORACIC 36FR (CATHETERS) ×4 IMPLANT
CATH THORACIC 36FR RT ANG (CATHETERS) ×4 IMPLANT
CLIP FOGARTY SPRING 6M (CLIP) ×2 IMPLANT
CLIP TI MEDIUM 24 (CLIP) IMPLANT
CLIP TI WIDE RED SMALL 24 (CLIP) ×4 IMPLANT
CONT SPEC 4OZ CLIKSEAL STRL BL (MISCELLANEOUS) ×2 IMPLANT
COVER SURGICAL LIGHT HANDLE (MISCELLANEOUS) ×4 IMPLANT
CRADLE DONUT ADULT HEAD (MISCELLANEOUS) ×4 IMPLANT
DERMABOND ADVANCED (GAUZE/BANDAGES/DRESSINGS) ×4
DERMABOND ADVANCED .7 DNX12 (GAUZE/BANDAGES/DRESSINGS) IMPLANT
DRAIN CHANNEL 28F RND 3/8 FF (WOUND CARE) ×2 IMPLANT
DRAPE CARDIOVASCULAR INCISE (DRAPES) ×4
DRAPE SLUSH/WARMER DISC (DRAPES) ×4 IMPLANT
DRAPE SRG 135X102X78XABS (DRAPES) ×2 IMPLANT
DRSG COVADERM 4X14 (GAUZE/BANDAGES/DRESSINGS) ×4 IMPLANT
ELECT REM PT RETURN 9FT ADLT (ELECTROSURGICAL) ×8
ELECTRODE REM PT RTRN 9FT ADLT (ELECTROSURGICAL) ×4 IMPLANT
GAUZE SPONGE 4X4 12PLY STRL (GAUZE/BANDAGES/DRESSINGS) ×8 IMPLANT
GLOVE BIOGEL M 6.5 STRL (GLOVE) ×8 IMPLANT
GLOVE BIOGEL M STER SZ 6 (GLOVE) ×12 IMPLANT
GLOVE BIOGEL PI IND STRL 7.0 (GLOVE) IMPLANT
GLOVE BIOGEL PI INDICATOR 7.0 (GLOVE) ×6
GLOVE EUDERMIC 7 POWDERFREE (GLOVE) ×12 IMPLANT
GOWN STRL REUS W/ TWL LRG LVL3 (GOWN DISPOSABLE) ×8 IMPLANT
GOWN STRL REUS W/ TWL XL LVL3 (GOWN DISPOSABLE) ×4 IMPLANT
GOWN STRL REUS W/TWL LRG LVL3 (GOWN DISPOSABLE) ×28
GOWN STRL REUS W/TWL XL LVL3 (GOWN DISPOSABLE) ×8
HEMOSTAT POWDER SURGIFOAM 1G (HEMOSTASIS) ×12 IMPLANT
HEMOSTAT SURGICEL 2X14 (HEMOSTASIS) ×8 IMPLANT
INSERT FOGARTY XLG (MISCELLANEOUS) ×2 IMPLANT
KIT BASIN OR (CUSTOM PROCEDURE TRAY) ×4 IMPLANT
KIT ROOM TURNOVER OR (KITS) ×4 IMPLANT
KIT SUCTION CATH 14FR (SUCTIONS) ×10 IMPLANT
KIT VASOVIEW W/TROCAR VH 2000 (KITS) ×4 IMPLANT
MARKER GRAFT CORONARY BYPASS (MISCELLANEOUS) ×12 IMPLANT
MARKER SKIN DUAL TIP RULER LAB (MISCELLANEOUS) ×2 IMPLANT
NS IRRIG 1000ML POUR BTL (IV SOLUTION) ×20 IMPLANT
PACK OPEN HEART (CUSTOM PROCEDURE TRAY) ×4 IMPLANT
PAD ARMBOARD 7.5X6 YLW CONV (MISCELLANEOUS) ×8 IMPLANT
PAD ELECT DEFIB RADIOL ZOLL (MISCELLANEOUS) ×4 IMPLANT
PENCIL BUTTON HOLSTER BLD 10FT (ELECTRODE) ×4 IMPLANT
PUNCH AORTIC ROTATE  4.5MM 8IN (MISCELLANEOUS) ×2 IMPLANT
PUNCH AORTIC ROTATE 4.0MM (MISCELLANEOUS) IMPLANT
PUNCH AORTIC ROTATE 4.5MM 8IN (MISCELLANEOUS) IMPLANT
PUNCH AORTIC ROTATE 5MM 8IN (MISCELLANEOUS) IMPLANT
SET CARDIOPLEGIA MPS 5001102 (MISCELLANEOUS) ×2 IMPLANT
SOLUTION ANTI FOG 6CC (MISCELLANEOUS) ×2 IMPLANT
SPONGE GAUZE 4X4 12PLY STER LF (GAUZE/BANDAGES/DRESSINGS) ×6 IMPLANT
SPONGE LAP 4X18 X RAY DECT (DISPOSABLE) ×2 IMPLANT
SUT BONE WAX W31G (SUTURE) ×4 IMPLANT
SUT MNCRL AB 4-0 PS2 18 (SUTURE) ×2 IMPLANT
SUT PROLENE 3 0 SH DA (SUTURE) ×4 IMPLANT
SUT PROLENE 4 0 RB 1 (SUTURE)
SUT PROLENE 4 0 SH DA (SUTURE) IMPLANT
SUT PROLENE 4-0 RB1 .5 CRCL 36 (SUTURE) IMPLANT
SUT PROLENE 6 0 C 1 30 (SUTURE) ×12 IMPLANT
SUT PROLENE 7 0 BV 1 (SUTURE) ×12 IMPLANT
SUT PROLENE 7 0 BV1 MDA (SUTURE) ×4 IMPLANT
SUT PROLENE 8 0 BV175 6 (SUTURE) IMPLANT
SUT STEEL 6MS V (SUTURE) ×4 IMPLANT
SUT STEEL STERNAL CCS#1 18IN (SUTURE) ×2 IMPLANT
SUT STEEL SZ 6 DBL 3X14 BALL (SUTURE) ×4 IMPLANT
SUT VIC AB 1 CTX 36 (SUTURE) ×8
SUT VIC AB 1 CTX36XBRD ANBCTR (SUTURE) ×4 IMPLANT
SUT VIC AB 2-0 CT1 27 (SUTURE) ×4
SUT VIC AB 2-0 CT1 TAPERPNT 27 (SUTURE) IMPLANT
SUT VIC AB 2-0 CTX 27 (SUTURE) IMPLANT
SUT VIC AB 3-0 SH 27 (SUTURE)
SUT VIC AB 3-0 SH 27X BRD (SUTURE) IMPLANT
SUT VIC AB 3-0 X1 27 (SUTURE) IMPLANT
SUT VICRYL 4-0 PS2 18IN ABS (SUTURE) IMPLANT
SUTURE E-PAK OPEN HEART (SUTURE) ×4 IMPLANT
SYSTEM SAHARA CHEST DRAIN ATS (WOUND CARE) ×4 IMPLANT
TAPE CLOTH SURG 4X10 WHT LF (GAUZE/BANDAGES/DRESSINGS) ×6 IMPLANT
TOWEL OR 17X24 6PK STRL BLUE (TOWEL DISPOSABLE) ×8 IMPLANT
TOWEL OR 17X26 10 PK STRL BLUE (TOWEL DISPOSABLE) ×8 IMPLANT
TRAY FOLEY IC TEMP SENS 16FR (CATHETERS) ×4 IMPLANT
TUBE FEEDING 8FR 16IN STR KANG (MISCELLANEOUS) ×4 IMPLANT
TUBING INSUFFLATION (TUBING) ×2 IMPLANT
TUBING INSUFFLATION 10FT LAP (TUBING) ×4 IMPLANT
UNDERPAD 30X30 INCONTINENT (UNDERPADS AND DIAPERS) ×4 IMPLANT
WATER STERILE IRR 1000ML POUR (IV SOLUTION) ×8 IMPLANT
YANKAUER SUCT BULB TIP NO VENT (SUCTIONS) ×2 IMPLANT

## 2013-08-14 NOTE — H&P (View-Only) (Signed)
Reason for Consult:3 vessel CAD Referring Physician: Dr. Chauncey Mann [name] Joshua [name] is an 73 y.o. male.  HPI: 73 yo man with a complicated medical history including multiple cardiac risk factors who presents with complaints of left shoulder pain and fatigue.  Joshua [name] is 73 yo gentleman with a PMH significant for heavy tobacco abuse, COPD, hypertension, dyslipidemia, poorly controlled diabetes and a recent episode of inflammatory pericarditis. He was hospitalized in late June with pericarditis. He had presented with left shoulder and back pain and general malaise. He was diagnosed with pericarditis  He had a pericardiocentesis and then was treated with steroids. He was noted to have severe coronary calcification but catheterization was postponed to avoid using blood thinners in the setting of a bloody effusion.  Since discharge he has felt better than he did when he was admitted, but still complains of left shoulder pain radiating to his back under his shoulder blade and also pain in the left flank. These pains do occur with exertion but also occur at rest. He also continues to generalized malaise and lack of energy.  Today he had cardiac catheterization which revealed severe 3 vessel CAD. He did not have a LV gram doe, but has a recent echo showing preserved LV systolic function.  He denies orthopnea and PND. + LE swelling but less than before. He says he quit smoking during his last admission and has not smoked since. He was on 60 mg of prednisone until recently. He says his sugars are poorly controlled and have been worse while on prednisone.   Past Medical History  Diagnosis Date  . Hypertension   . Tobacco dependence     Prior failure to quit with wellbutrin.  100+ pack-yr history.  Quit for 1 mo to get hip replacement (1 pack/day)  . Obesity   . Hyperplastic colon polyp 2001  . Erectile dysfunction     Normal testosterone  . Adenomatous colon polyp 10/16/2011    Repeat 2018  . Chronic  atrophic gastritis 02/25/12    gastric bx: +intestinal metaplasia, h. pylori neg, no dysplasia or malignancy.  . Macular degeneration, dry     Mild, bilat (Optometrist, Mayford Knife at [facility] in [place], [place])  . Iron deficiency anemia 2014    03/2012 capsule endoscopy showed 2 AVMs--likely responsible for his IDA--lifetime iron supp recommended + q51moCBCs.  . 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 COPD (chronic obstructive pulmonary disease)     Spirometry  2004 borderline obstruction  . Hyperlipidemia   . 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 Other and unspecified angina pectoris   . DOE (dyspnea on exertion)   . Pneumonia   . DM type 2 (diabetes mellitus, type 2)     Poor control on max oral meds--pt eventually agreed to insulin therapy  . CKD (chronic kidney disease), stage I   . Diabetic nephropathy     Elevated urine microalb/cr 03/2011    Past Surgical History  Procedure Laterality Date  . Cholecystectomy open  1999  . Appendectomy  1957  . Hip surgery Right 1954    Repair of slipped capital femoral epiphysis.  . Patella fracture surgery Left ~ 1979    bolt + 3 screws to repair tib plateau fx  . Testicle surgery  as a child    Undescended testicle brought down into scrotum  . Tonsillectomy  1947  .  Colonoscopy  10/16/2011    Procedure: COLONOSCOPY;  Surgeon: Robert D Kaplan, MD;  Location: [facility];  Service: Endoscopy;  Laterality: N/A;  . Cataract extraction w/ intraocular lens  implant, bilateral  04/08/2006 & 04/22/2006  . Esophagogastroduodenoscopy  02/25/12    Atrophic gastritis with a few erosions--capsule endoscopy planned as of 02/25/12 (Dr. Kaplan).  . Total hip arthroplasty Right 08/12/2012    Procedure: REMOVAL OF OLD PINS RIGHT HIP AND RIGHT TOTAL HIP ARTHROPLASTY ANTERIOR APPROACH;  Surgeon: Christopher Y Blackman, MD;  Location: [facility];  Service: Orthopedics;  Laterality:  Right;  . Hardware removal Right 08/12/2012    Procedure: HARDWARE REMOVAL;  Surgeon: Christopher Y Blackman, MD;  Location: [facility];  Service: Orthopedics;  Laterality: Right;  . Cardiac catheterization  08/08/2013  . Cataract extraction w/ intraocular lens  implant, bilateral Bilateral     Family History  Problem Relation Age of Onset  . Breast cancer Sister   . Diabetes Maternal Uncle   . Diabetes Paternal Grandmother   . Heart disease Father   . Heart disease Maternal Uncle   . CVA Mother     Social History:  reports that he quit smoking about 5 weeks ago. His smoking use included Cigarettes. He has a 62 pack-year smoking history. He quit smokeless tobacco use about 5 weeks ago. He reports that he does not drink alcohol or use illicit drugs.  Allergies:  Allergies  Allergen Reactions  . Morphine And Related Other (See Comments)    Drenched with perspiration  . Demerol [Meperidine] Nausea Only  . Starlix [Nateglinide] Other (See Comments)    gassy    Medications:  Prior to Admission:  Prescriptions prior to admission  Medication Sig Dispense Refill  . aspirin 81 MG tablet Take 81 mg by mouth daily.      . budesonide-formoterol (SYMBICORT) 160-4.5 MCG/ACT inhaler Inhale 2 puffs into the lungs 2 (two) times daily.  1 Inhaler  12  . buPROPion (WELLBUTRIN SR) 150 MG 12 hr tablet Take 150 mg by mouth daily.      . chlorthalidone (HYGROTON) 25 MG tablet Take 1 tablet (25 mg total) by mouth daily.  30 tablet  10  . Cholecalciferol (VITAMIN D-3) 1000 UNITS CAPS Take 1,000 Units by mouth daily.       . Chromium Picolinate 1000 MCG TABS Take 1 tablet by mouth daily.      . ferrous sulfate 325 (65 FE) MG tablet Take 325 mg by mouth daily with breakfast.      . glimepiride (AMARYL) 4 MG tablet Take 1 tablet (4 mg total) by mouth 2 (two) times daily.  180 tablet  1  . Insulin Detemir (LEVEMIR FLEXPEN) 100 UNIT/ML Pen Inject 39 Units into the skin at bedtime.      . lovastatin (MEVACOR) 40  MG tablet Take 1 tablet (40 mg total) by mouth 2 (two) times daily.  180 tablet  1  . metFORMIN (GLUCOPHAGE) 1000 MG tablet Take 1 tablet (1,000 mg total) by mouth 2 (two) times daily with a meal.  180 tablet  1  . Multiple Vitamin (MULTIVITAMIN WITH MINERALS) TABS Take 1 tablet by mouth daily.      . nicotine (NICODERM CQ - DOSED IN MG/24 HR) 7 mg/24hr patch Place 7 mg onto the skin every other day.      . pioglitazone (ACTOS) 45 MG tablet Take 1 tablet (45 mg total) by mouth daily.  90 tablet  1  . ramipril (ALTACE) 10   MG capsule Take 1 capsule (10 mg total) by mouth 2 (two) times daily.  180 capsule  1    Results for orders placed during the hospital encounter of 08/08/13 (from the past 48 hour(s))  GLUCOSE, CAPILLARY     Status: Abnormal   Collection Time    08/08/13  7:06 AM      Result Value Ref Range   Glucose-Capillary 65 (*) 70 - 99 mg/dL  GLUCOSE, CAPILLARY     Status: Abnormal   Collection Time    08/08/13  7:46 AM      Result Value Ref Range   Glucose-Capillary 148 (*) 70 - 99 mg/dL   Comment 1 Notify RN     Comment 2 Documented in Chart    GLUCOSE, CAPILLARY     Status: None   Collection Time    08/08/13  1:55 PM      Result Value Ref Range   Glucose-Capillary 81  70 - 99 mg/dL   Comment 1 Notify RN    GLUCOSE, CAPILLARY     Status: Abnormal   Collection Time    08/08/13  5:21 PM      Result Value Ref Range   Glucose-Capillary 346 (*) 70 - 99 mg/dL   Comment 1 Notify RN      No results found.  Review of Systems  Constitutional: Positive for malaise/fatigue. Negative for fever.  Respiratory: Positive for cough and shortness of breath.   Cardiovascular: Positive for chest pain and leg swelling. Negative for orthopnea.  Gastrointestinal: Positive for abdominal pain (left flank).  Neurological: Positive for weakness.  All other systems reviewed and are negative.  Blood pressure 164/72, pulse 86, temperature 97.6 F (36.4 C), temperature source Oral, resp. rate  18, height 5' 11" (1.803 m), weight 190 lb (86.183 kg), SpO2 95.00%. Physical Exam  Vitals reviewed. Constitutional: He is oriented to person, place, and time. He appears well-developed and well-nourished. No distress.  HENT:  Head: Normocephalic and atraumatic.  Eyes: EOM are normal. Pupils are equal, round, and reactive to light.  Neck: Neck supple. No thyromegaly present.  No carotid bruits  Cardiovascular: Normal rate, regular rhythm and normal heart sounds.  Exam reveals no friction rub.   No murmur heard. Respiratory: Effort normal and breath sounds normal. He has no wheezes.  GI: Soft. There is no tenderness.  Musculoskeletal: He exhibits no edema.  Lymphadenopathy:    He has no cervical adenopathy.  Neurological: He is alert and oriented to person, place, and time. No cranial nerve deficit.  Skin: Skin is warm and dry.   ECHOCARDIOGRAM 07/28/2013 Study Conclusions  - Left ventricle: The cavity size was normal. Wall thickness was increased in a pattern of severe LVH. Systolic function was normal. The estimated ejection fraction was in the range of 55% to 60%. Wall motion was normal; there were no regional wall motion abnormalities. - Pericardium, extracardiac: The pericardium is somewhat bright and thickened. No pericardial effusion is present.  Impressions:  - No change compared to prior study on 07/04/13. Pericardial effusion remains absent.  CARDIAC CATHETERIZATION   HEMODYNAMICS: Aortic pressure was 131/61; LV pressure was 126/12; LVEDP 18. There was no gradient between the left ventricle and aorta.  ANGIOGRAPHIC DATA: The left main coronary artery is patent proximally. There is a distal 50% stenosis which seems to extend into the ostial LAD.  The left anterior descending artery is a large vessel which wraps around the apex. The proximal vessel is heavily calcified. There is   an 80% proximal stenosis. There is a large first diagonal which has an 80% ostial stenosis.  Just beyond the diagonal, there is a 70% LAD stenosis. There is a medium size diagonal which then originates in his patent. The remainder of the mid to distal LAD is widely patent.  The left circumflex artery is occluded at the ostium. There are left to left collaterals which fill large obtuse marginal vessel and circumflex vessel.  The right coronary artery is a large dominant vessel. In the midportion, there is a focal 80%, calcific stenosis. After the origin of a large RV marginal branch, there is a focal 75% lesion which is heavily calcified. There is moderate, calcific atherosclerosis throughout the distal RCA with a thick layer of visible calcium.  LEFT VENTRICULOGRAM: Left ventricular angiogram was not done. Recent echocardiogram showed normal left ventricular function. LVEDP was 18 mmHg.  IMPRESSIONS:  1. Severe three-vessel disease as noted above. 2. Normal left ventricular systolic function by recent echocardiogram. LVEDP 18 mmHg.  RECOMMENDATION: Plan cardiac surgery consultation    Assessment/Plan: 73 yo man with multiple cardiac risk factors who presents with left shoulder pain and weakness/ fatigue. He has severe 3 vessel CAD with preserved LV function. CABG is indicated for survival benefit and possible relief of symptoms. It is unclear how much difference it will make symptomatically as his symptoms are not classic and he is fairly poor historian.  I informed him of the indications, risks, benefits and alternatives(medical therapy). I discussed the general nature of the procedure, the need for general anesthesia, and the incisions to be used with Joshua [name] and his family. We reviewed the expected hospital stay, overall recovery and short and long term outcomes. They understand the risks include, but are not limited to death, stroke, MI, DVT/PE, bleeding, possible need for transfusion, infections, cardiac arrhythmias, and other organ system dysfunction including respiratory, renal, or GI  complications.   We did discuss several factors which increase his operative risk. He has ascending aortic calcific atherosclerotic disease increasing his risk for stroke. He has had recent pericarditis which could make the surgery more difficult technically and increase the risk of bleeding and transfusion. He has poorly controlled diabetes and recent steroids which increase his risk for infection and wound healing problems. Heavy tobacco abuse history with an increased risk of respiratory complications. Given his recent high dose steroids I suspect we will need to cover him with steroids perioperatively.  He understands the risks and wishes to proceed.  Currently the first available OR time is next Monday 08/14/2013  He needs a carotid duplex and PFTs preop   Saamiya Jeppsen C 08/08/2013, 6:03 PM

## 2013-08-14 NOTE — Transfer of Care (Signed)
Immediate Anesthesia Transfer of Care Note  Patient: Joshua [name]  Procedure(s) Performed: Procedure(s) with comments: CORONARY ARTERY BYPASS GRAFTING (CABG) (N/A) - Times 4   using left internal mammary artery and endoscopically harvested bilateral saphenous vein INTRAOPERATIVE TRANSESOPHAGEAL ECHOCARDIOGRAM (N/A)  Patient Location: SICU  Anesthesia Type:General  Level of Consciousness: sedated, unresponsive and Patient remains intubated per anesthesia plan  Airway & Oxygen Therapy: Patient remains intubated per anesthesia plan and Patient placed on Ventilator (see vital sign flow sheet for setting)  Post-op Assessment: Report given to PACU RN and Post -op Vital signs reviewed and stable  Post vital signs: Reviewed and stable  Complications: No apparent anesthesia complications

## 2013-08-14 NOTE — Interval H&P Note (Signed)
History and Physical Interval Note:  08/14/2013 7:23 AM  Joshua [name]  has presented today for surgery, with the diagnosis of CAD  The various methods of treatment have been discussed with the patient and family. After consideration of risks, benefits and other options for treatment, the patient has consented to  Procedure(s): CORONARY ARTERY BYPASS GRAFTING (CABG) (N/A) INTRAOPERATIVE TRANSESOPHAGEAL ECHOCARDIOGRAM (N/A) as a surgical intervention .  The patient's history has been reviewed, patient examined, no change in status, stable for surgery.  I have reviewed the patient's chart and labs.  Questions were answered to the patient's satisfaction.     Syanna Remmert C

## 2013-08-14 NOTE — Brief Op Note (Addendum)
08/14/2013  11:26 AM  PATIENT:  Joshua [name]  73 y.o. male  PRE-OPERATIVE DIAGNOSIS:  CAD  POST-OPERATIVE DIAGNOSIS:  CAD  PROCEDURE:  Procedure(s) with comments:  CORONARY ARTERY BYPASS GRAFTING x 4 -LIMA to LAD -SVG to DIAGONAL 1 -SVG to OM -SVG to PDA  ENDOSCOPIC SAPHENOUS VEIN HARVEST VEIN HARVEST -Right and Left Thigh  INTRAOPERATIVE TRANSESOPHAGEAL ECHOCARDIOGRAM (N/A)  SURGEON:  Surgeon(s) and Role:    * Melrose Nakayama, MD - Primary  PHYSICIAN ASSISTANT: Ellwood Handler PA-C  ANESTHESIA:   general  EBL:  Total I/O In: -  Out: 900 [Urine:900]  BLOOD ADMINISTERED:CELLSAVER  DRAINS: Left pleural, 2 mediastinal CT  LOCAL MEDICATIONS USED:  NONE  SPECIMEN:  No Specimen  DISPOSITION OF SPECIMEN:  N/A  COUNTS:  YES  PLAN OF CARE: Admit to inpatient   PATIENT DISPOSITION:  ICU - intubated and hemodynamically stable.   Delay start of Pharmacological VTE agent (>24hrs) due to surgical blood loss or risk of bleeding: yes  XC= 75 min CPB= 132 min  Fair targets, good conduits Severe pericardial and epicardial inflammation/ adhesions

## 2013-08-14 NOTE — Progress Notes (Signed)
Extubated  Moving all 4 and following commands  BP 98/59  Pulse 90  Temp(Src) 99 F (37.2 C) (Oral)  Resp 16  Ht 5' 10.75" (1.797 m)  Wt 202 lb (91.627 kg)  BMI 28.37 kg/m2  SpO2 100%  PA 28/17 CI= 2.1   Intake/Output Summary (Last 24 hours) at 08/14/13 1852 Last data filed at 08/14/13 1830  Gross per 24 hour  Intake 4882.77 ml  Output   4119 ml  Net 763.77 ml   Ct output minimal

## 2013-08-14 NOTE — Anesthesia Procedure Notes (Signed)
Procedures

## 2013-08-14 NOTE — Procedures (Signed)
Extubation Procedure Note  Patient Details:   Name: Joshua [name] DOB: 1940/07/22 MRN: [id number]   Airway Documentation:     Evaluation  O2 sats: stable throughout Complications: No apparent complications Patient did tolerate procedure well. Bilateral Breath Sounds: Clear Suctioning: Airway Yes  Patient extubated to [place].  NIF -18.  VC of 1L.  Positive cuff leak.  Vitals are currently stable.  Patient able to speak after.   [name] 08/14/2013, 6:32 PM

## 2013-08-14 NOTE — Anesthesia Preprocedure Evaluation (Signed)
Anesthesia Evaluation  Patient identified by MRN, date of birth, ID band Patient awake    Reviewed: Allergy & Precautions, H&P , NPO status , Patient's Chart, lab work & pertinent test results  Airway Mallampati: I TM Distance: >3 FB Neck ROM: Full    Dental   Pulmonary COPDformer smoker,          Cardiovascular hypertension, Pt. on medications and Pt. on home beta blockers + CAD and + DOE     Neuro/Psych    GI/Hepatic   Endo/Other  diabetes, Type 2, Oral Hypoglycemic Agents  Renal/GU      Musculoskeletal   Abdominal   Peds  Hematology   Anesthesia Other Findings   Reproductive/Obstetrics                           Anesthesia Physical Anesthesia Plan  ASA: III  Anesthesia Plan: General   Post-op Pain Management:    Induction: Intravenous  Airway Management Planned: Oral ETT  Additional Equipment: Ultrasound Guidance Line Placement, 3D TEE, PA Cath, CVP and Arterial line  Intra-op Plan:   Post-operative Plan: Post-operative intubation/ventilation  Informed Consent: I have reviewed the patients History and Physical, chart, labs and discussed the procedure including the risks, benefits and alternatives for the proposed anesthesia with the patient or authorized representative who has indicated his/her understanding and acceptance.     Plan Discussed with: CRNA and Surgeon  Anesthesia Plan Comments:         Anesthesia Quick Evaluation

## 2013-08-14 NOTE — Anesthesia Postprocedure Evaluation (Signed)
  Anesthesia Post-op Note  Patient: Joshua [name]  Procedure(s) Performed: Procedure(s) with comments: CORONARY ARTERY BYPASS GRAFTING (CABG) (N/A) - Times 4   using left internal mammary artery and endoscopically harvested bilateral saphenous vein INTRAOPERATIVE TRANSESOPHAGEAL ECHOCARDIOGRAM (N/A)  Patient Location: PACU and SICU  Anesthesia Type:General  Level of Consciousness: sedated  Airway and Oxygen Therapy: Patient remains intubated per anesthesia plan  Post-op Pain: mild  Post-op Assessment: Post-op Vital signs reviewed  Post-op Vital Signs: Reviewed  Last Vitals:  Filed Vitals:   08/14/13 0544  BP: 115/53  Pulse: 79  Temp: 36.7 C  Resp: 18    Complications: No apparent anesthesia complications

## 2013-08-15 ENCOUNTER — Inpatient Hospital Stay (HOSPITAL_COMMUNITY): Payer: Medicare HMO

## 2013-08-15 LAB — BASIC METABOLIC PANEL
ANION GAP: 13 (ref 5–15)
BUN: 20 mg/dL (ref 6–23)
CALCIUM: 7.6 mg/dL — AB (ref 8.4–10.5)
CO2: 22 meq/L (ref 19–32)
Chloride: 101 mEq/L (ref 96–112)
Creatinine, Ser: 0.61 mg/dL (ref 0.50–1.35)
GFR calc Af Amer: >90 mL/min (ref >90)
GLUCOSE: 112 mg/dL — AB (ref 70–99)
POTASSIUM: 3.6 meq/L — AB (ref 3.7–5.3)
SODIUM: 136 meq/L — AB (ref 137–147)

## 2013-08-15 LAB — CBC
HCT: 29.2 % — ABNORMAL LOW (ref 39.0–52.0)
HCT: 25.9 % — ABNORMAL LOW (ref 39.0–52.0)
HEMOGLOBIN: 9.6 g/dL — AB (ref 13.0–17.0)
Hemoglobin: 8.5 g/dL — ABNORMAL LOW (ref 13.0–17.0)
MCH: 27.8 pg (ref 26.0–34.0)
MCH: 27.6 pg (ref 26.0–34.0)
MCHC: 32.9 g/dL (ref 30.0–36.0)
MCHC: 32.8 g/dL (ref 30.0–36.0)
MCV: 84.6 fL (ref 78.0–100.0)
MCV: 84.1 fL (ref 78.0–100.0)
PLATELETS: 137 10*3/uL — AB (ref 150–400)
Platelets: 171 10*3/uL (ref 150–400)
RBC: 3.45 MIL/uL — AB (ref 4.22–5.81)
RBC: 3.08 MIL/uL — ABNORMAL LOW (ref 4.22–5.81)
RDW: 16.1 % — ABNORMAL HIGH (ref 11.5–15.5)
RDW: 16 % — AB (ref 11.5–15.5)
WBC: 9.8 10*3/uL (ref 4.0–10.5)
WBC: 7.6 10*3/uL (ref 4.0–10.5)

## 2013-08-15 LAB — POCT I-STAT 3, ART BLOOD GAS (G3+)
Acid-base deficit: 1 mmol/L (ref 0.0–2.0)
Bicarbonate: 23.2 mEq/L (ref 20.0–24.0)
O2 Saturation: 95 %
PH ART: 7.408 (ref 7.350–7.450)
PO2 ART: 73 mmHg — AB (ref 80.0–100.0)
Patient temperature: 36.6
TCO2: 24 mmol/L (ref 0–100)
pCO2 arterial: 36.7 mmHg (ref 35.0–45.0)

## 2013-08-15 LAB — POCT I-STAT, CHEM 8
BUN: 20 mg/dL (ref 6–23)
Calcium, Ion: 1.12 mmol/L — ABNORMAL LOW (ref 1.13–1.30)
Chloride: 98 mEq/L (ref 96–112)
Creatinine, Ser: 0.9 mg/dL (ref 0.50–1.35)
GLUCOSE: 159 mg/dL — AB (ref 70–99)
HEMATOCRIT: 26 % — AB (ref 39.0–52.0)
Hemoglobin: 8.8 g/dL — ABNORMAL LOW (ref 13.0–17.0)
POTASSIUM: 4 meq/L (ref 3.7–5.3)
SODIUM: 135 meq/L — AB (ref 137–147)
TCO2: 21 mmol/L (ref 0–100)

## 2013-08-15 LAB — GLUCOSE, CAPILLARY
GLUCOSE-CAPILLARY: 105 mg/dL — AB (ref 70–99)
GLUCOSE-CAPILLARY: 107 mg/dL — AB (ref 70–99)
GLUCOSE-CAPILLARY: 116 mg/dL — AB (ref 70–99)
GLUCOSE-CAPILLARY: 111 mg/dL — AB (ref 70–99)
GLUCOSE-CAPILLARY: 79 mg/dL (ref 70–99)
GLUCOSE-CAPILLARY: 150 mg/dL — AB (ref 70–99)
GLUCOSE-CAPILLARY: 235 mg/dL — AB (ref 70–99)
GLUCOSE-CAPILLARY: 159 mg/dL — AB (ref 70–99)
GLUCOSE-CAPILLARY: 238 mg/dL — AB (ref 70–99)
Glucose-Capillary: 103 mg/dL — ABNORMAL HIGH (ref 70–99)
Glucose-Capillary: 114 mg/dL — ABNORMAL HIGH (ref 70–99)
Glucose-Capillary: 135 mg/dL — ABNORMAL HIGH (ref 70–99)
Glucose-Capillary: 137 mg/dL — ABNORMAL HIGH (ref 70–99)
Glucose-Capillary: 142 mg/dL — ABNORMAL HIGH (ref 70–99)
Glucose-Capillary: 156 mg/dL — ABNORMAL HIGH (ref 70–99)
Glucose-Capillary: 159 mg/dL — ABNORMAL HIGH (ref 70–99)
Glucose-Capillary: 197 mg/dL — ABNORMAL HIGH (ref 70–99)
Glucose-Capillary: 199 mg/dL — ABNORMAL HIGH (ref 70–99)
Glucose-Capillary: 223 mg/dL — ABNORMAL HIGH (ref 70–99)

## 2013-08-15 LAB — CREATININE, SERUM
Creatinine, Ser: 0.78 mg/dL (ref 0.50–1.35)
GFR calc non Af Amer: 87 mL/min — ABNORMAL LOW (ref >90)

## 2013-08-15 LAB — MAGNESIUM
MAGNESIUM: 2.6 mg/dL — AB (ref 1.5–2.5)
Magnesium: 2.4 mg/dL (ref 1.5–2.5)

## 2013-08-15 MED ORDER — INSULIN DETEMIR 100 UNIT/ML ~~LOC~~ SOLN: 36.0000 [IU] | Freq: Every day | SUBCUTANEOUS | Status: DC

## 2013-08-15 MED ORDER — POTASSIUM CHLORIDE 10 MEQ/50ML IV SOLN
10.0000 meq | INTRAVENOUS | Status: AC
  Administered 2013-08-15 (×3): 10 meq via INTRAVENOUS
  Filled 2013-08-15 (×3): qty 50

## 2013-08-15 MED ORDER — INSULIN ASPART 100 UNIT/ML ~~LOC~~ SOLN
0.0000 [IU] | SUBCUTANEOUS | Status: DC
  Administered 2013-08-15: 2 [IU] via SUBCUTANEOUS
  Administered 2013-08-15 (×2): 8 [IU] via SUBCUTANEOUS
  Administered 2013-08-16: 2 [IU] via SUBCUTANEOUS

## 2013-08-15 MED ORDER — CHLORTHALIDONE 25 MG PO TABS
25.0000 mg | ORAL_TABLET | Freq: Every day | ORAL | Status: DC
  Administered 2013-08-15: 25 mg via ORAL
  Filled 2013-08-15 (×2): qty 1

## 2013-08-15 MED ORDER — INSULIN DETEMIR 100 UNIT/ML ~~LOC~~ SOLN
36.0000 [IU] | Freq: Once | SUBCUTANEOUS | Status: AC
  Administered 2013-08-15: 36 [IU] via SUBCUTANEOUS
  Filled 2013-08-15: qty 0.36

## 2013-08-15 MED ORDER — INSULIN DETEMIR 100 UNIT/ML ~~LOC~~ SOLN
25.0000 [IU] | Freq: Two times a day (BID) | SUBCUTANEOUS | Status: DC
  Administered 2013-08-15: 25 [IU] via SUBCUTANEOUS
  Filled 2013-08-15 (×3): qty 0.25

## 2013-08-15 MED ORDER — ENOXAPARIN SODIUM 40 MG/0.4ML ~~LOC~~ SOLN
40.0000 mg | Freq: Every day | SUBCUTANEOUS | Status: DC
Start: 2013-08-15 — End: 2013-08-16
  Administered 2013-08-15: 40 mg via SUBCUTANEOUS
  Filled 2013-08-15 (×2): qty 0.4

## 2013-08-15 MED ORDER — INSULIN ASPART 100 UNIT/ML ~~LOC~~ SOLN
3.0000 [IU] | Freq: Three times a day (TID) | SUBCUTANEOUS | Status: DC
  Administered 2013-08-15 – 2013-08-18 (×7): 3 [IU] via SUBCUTANEOUS

## 2013-08-15 MED FILL — Magnesium Sulfate Inj 50%: INTRAMUSCULAR | Qty: 10 | Status: AC

## 2013-08-15 MED FILL — Lidocaine HCl IV Inj 20 MG/ML: INTRAVENOUS | Qty: 5 | Status: AC

## 2013-08-15 MED FILL — Potassium Chloride Inj 2 mEq/ML: INTRAVENOUS | Qty: 40 | Status: AC

## 2013-08-15 MED FILL — Heparin Sodium (Porcine) Inj 1000 Unit/ML: INTRAMUSCULAR | Qty: 30 | Status: AC

## 2013-08-15 MED FILL — Mannitol IV Soln 20%: INTRAVENOUS | Qty: 500 | Status: AC

## 2013-08-15 MED FILL — Heparin Sodium (Porcine) Inj 1000 Unit/ML: INTRAMUSCULAR | Qty: 10 | Status: AC

## 2013-08-15 MED FILL — Electrolyte-R (PH 7.4) Solution: INTRAVENOUS | Qty: 4000 | Status: AC

## 2013-08-15 MED FILL — Sodium Bicarbonate IV Soln 8.4%: INTRAVENOUS | Qty: 50 | Status: AC

## 2013-08-15 MED FILL — Sodium Chloride IV Soln 0.9%: INTRAVENOUS | Qty: 4000 | Status: AC

## 2013-08-15 NOTE — Progress Notes (Signed)
1 Day Post-Op Procedure(s) (LRB): CORONARY ARTERY BYPASS GRAFTING (CABG) (N/A) INTRAOPERATIVE TRANSESOPHAGEAL ECHOCARDIOGRAM (N/A) Subjective: Some discomfort, but overall feels well  Objective: Vital signs in last 24 hours: Temp:  [97.3 F (36.3 C)-99 F (37.2 C)] 98.2 F (36.8 C) (08/11 0600) Pulse Rate:  [72-90] 90 (08/11 0600) Cardiac Rhythm:  [-] Atrial paced (08/11 0547) Resp:  [11-19] 14 (08/11 0600) BP: (83-122)/(48-61) 99/61 mmHg (08/10 1900) SpO2:  [95 %-100 %] 96 % (08/11 0600) Arterial Line BP: (90-159)/(35-67) 105/35 mmHg (08/11 0600) FiO2 (%):  [40 %-50 %] 40 % (08/10 1744) Weight:  [208 lb 12.4 oz (94.7 kg)] 208 lb 12.4 oz (94.7 kg) (08/11 0500)  Hemodynamic parameters for last 24 hours: PAP: (18-40)/(7-25) 18/7 mmHg CO:  [3.3 L/min-6.1 L/min] 6.1 L/min CI:  [1.6 L/min/m2-2.9 L/min/m2] 2.9 L/min/m2  Intake/Output from previous day: 08/10 0701 - 08/11 0700 In: 6353.9 [P.O.:250; I.V.:3913.9; Blood:790; IV Piggyback:1400] Out: 3414 [Urine:3450; Emesis/NG output:30; Blood:1625; Chest QHQI:165] Intake/Output this shift:    General appearance: alert and no distress Neurologic: intact Heart: regular rate and rhythm Lungs: diminished breath sounds bibasilar Abdomen: normal findings: soft, non-tender  Lab Results:  Recent Labs  08/14/13 2005 08/14/13 2014 08/15/13 0415  WBC 10.5  --  9.8  HGB 10.3* 10.2* 9.6*  HCT 31.7* 30.0* 29.2*  PLT 191  --  171   BMET:  Recent Labs  08/14/13 2014 08/15/13 0415  NA 137 136*  K 4.4 3.6*  CL 104 101  CO2  --  22  GLUCOSE 148* 112*  BUN 22 20  CREATININE 0.70 0.61  CALCIUM  --  7.6*    PT/INR:  Recent Labs  08/14/13 1300  LABPROT 17.2*  INR 1.40   ABG    Component Value Date/Time   PHART 7.408 08/15/2013 0420   HCO3 23.2 08/15/2013 0420   TCO2 24 08/15/2013 0420   ACIDBASEDEF 1.0 08/15/2013 0420   O2SAT 95.0 08/15/2013 0420   CBG (last 3)   Recent Labs  08/15/13 0524 08/15/13 0625 08/15/13 0705   GLUCAP 111* 79 103*    Assessment/Plan: S/P Procedure(s) (LRB): CORONARY ARTERY BYPASS GRAFTING (CABG) (N/A) INTRAOPERATIVE TRANSESOPHAGEAL ECHOCARDIOGRAM (N/A) POD # 1 CV- stable with good CO- dc swan  RESP- continue IS, nebs  RENAL- creatinine OK, weight up a little  No indication for diuresis at present  Hypokalemia- supplement K  ENDO- CBG well controlled- transition off dripp  Steroid taper  Anemia- mild, secondary to ABL- follow  DC CT  OOB, ambulate   LOS: 1 day    Carlisha Wisler C 08/15/2013

## 2013-08-15 NOTE — Progress Notes (Signed)
Patient examined and record reviewed.Hemodynamics stable,labs satisfactory.Patient had stable day.Continue current care. Increase lantus for rising glucose, steroid effect  VAN TRIGT III,Verdis Koval 08/15/2013

## 2013-08-15 NOTE — Op Note (Signed)
Joshua [name], Joshua [name] NO.:  [id number]  MEDICAL RECORD NO.:  [id number]  LOCATION:  2S01C                        FACILITY:  [facility]  PHYSICIAN:  Revonda Standard. Roxan Hockey, M.D.DATE OF BIRTH:  11-03-1940  DATE OF PROCEDURE:  08/14/2013 DATE OF DISCHARGE:                              OPERATIVE REPORT   PREOPERATIVE DIAGNOSIS:  Severe three-vessel coronary disease.  POSTOPERATIVE DIAGNOSIS:  Severe three-vessel coronary disease.  PROCEDURE:  Median sternotomy, extracorporeal circulation, coronary artery bypass grafting x4 (left internal mammary artery to left anterior descending, saphenous vein graft to first diagonal, saphenous vein graft to obtuse marginal, saphenous vein graft to posterior descending), endoscopic vein harvest both thighs.  SURGEON:  Revonda Standard. Roxan Hockey, M.D.  ASSISTANT:  Ellwood Handler, PA.  ANESTHESIA:  General.  FINDINGS:  Fair quality target vessels, good quality conduits, preserved left ventricular function by TEE, severe inflammatory adhesions and exudate on the pericardium and epicardium.  CLINICAL NOTE:  Joshua [name] is a 73 year old gentleman with multiple cardiac risk factors.  He presented with left shoulder pain along with weakness and fatigue.  He had pericarditis approximately a month prior to surgery.  He had been treated with steroids for that.  He underwent cardiac catheterization, and was found to have severe three-vessel coronary disease. He was advised to have coronary artery bypass grafting. The indications, risks, benefits, and alternatives were discussed in detail with the patient.  He understood and accepted the risks and agreed to proceed.  OPERATIVE NOTE:  Joshua [name] was brought to the preoperative holding area on August 14, 2013.  There, Anesthesia placed a Swan-Ganz catheter and an arterial blood pressure monitoring line.  He was taken to the operating room, anesthetized, and intubated.  Intravenous antibiotics were  administered.  Transesophageal echocardiography was performed.  It showed preserved left ventricular function with no significant valvular pathology.  A Foley catheter was placed.  The chest, abdomen, and legs were prepped and draped in usual sterile fashion.  An incision was made in the right leg at the level of the knee.  The greater saphenous vein was identified and was harvested from the right thigh endoscopically.  On attempting to harvest the vein from below the knee, it bifurcated into 2 small branches and they were not suitable for use as a graft, therefore additional vein was harvested from the left thigh.  Both saphenous veins were of good quality.  Simultaneously with the saphenous vein harvest, a median sternotomy was performed.  The left internal mammary artery was harvested using standard technique.  It was a good quality vessel.  2000 units of heparin was administered during the vessel harvest.  The remainder of the full heparin dose was given prior to opening the pericardium.  After harvesting the conduits and heparinizing the patient, the pericardium was incised, it was immediately apparent that there was a fibrinous gelatinous peel between the pericardial and epicardium.  The pericardium itself was thickened.  A combination of sharp and blunt dissection was used to expose the aorta and right side of the heart as well as the anterior surface of the left ventricle.  The remainder of the adhesions were taken down after the patient was  on bypass.  These adhesions were very friable and bled easily.  After confirming adequate anticoagulation with ACT measurement, the aorta was cannulated via concentric 2-0 Ethibond pledgeted pursestring sutures.  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  The aorta was cannulated and the right atrium  was cannulated, this was difficult due to the adhesions.  Once the dual stage venous cannula was in place, cardiopulmonary bypass was instituted, and the patient was cooled to 32 degrees Celsius.  The remainder of the adhesions were taken down.  The coronary arteries were inspected and anastomotic sites were chosen.  The obtuse marginal could not be identified at this point in time.  There was a severe inflammatory reaction of the epicardium, which made dissection of the coronaries difficult.  The conduits were inspected and cut to length.  A foam pad was placed in the pericardium to insulate the heart.  A temperature probe was placed in the myocardial septum and a cardioplegic cannula was placed in the ascending aorta.  The aorta was crossclamped.  The left ventricle was emptied via the aortic root vent.  Cardiac arrest then was achieved with a combination of cold antegrade blood cardioplegia and topical iced saline.  1 L of cardioplegia was administered.  There was septal cooling to 9 degrees Celsius and a rapid diastolic arrest.  A reversed saphenous vein graft was placed end-to-side to the posterior descending branch of the right coronary.  This was a heavily calcified vessel proximally.  A 1.5 mm probe did pass distally.  It was a fair quality target.  The vein was good quality. It was anastomosed end-to- side with a running 7-0 Prolene suture.  All anastomoses were probed proximally and distally at their completion before tying the suture. Cardioplegia was administered down each vein graft to assess flow and hemostasis, both were good.  Next, a reversed saphenous vein graft was placed end-to-side to the first diagonal branch to the LAD.  This was the larger of the diagonals.  The other diagonal branch seen on catheterization could not be identified due to the epicardial inflammation.  The diagonal was a 1.5-mm target vessel and it was fair quality.  The vein was of good quality.   It was anastomosed end-to- side with a running 7-0 Prolene suture.  There was good flow and good hemostasis with cardioplegia administration.  Additional cardioplegia was administered down the aortic root.  Next, the heart was elevated exposing the lateral wall of the heart.  The obtuse marginal branch could not be visualized due to the severe inflammation, however, calcium could be palpated in the vessel and the artery was dissected out just distal to this area of calcification, it turned out to be a 1.5-mm target vessel.  It was a fair quality vessel.  The vein graft was of good quality.  It was anastomosed end-to-side with running 7-0 Prolene suture.  Again, the probe passed easily and there was good flow and good hemostasis with cardioplegia administration.  Additional cardioplegia was administered down the vein grafts as well as the aortic root.  The left internal mammary artery then was brought through an incision in the pericardium, the distal end was beveled, it was anastomosed end-to-side to the distal LAD.  The LAD was a 1.5 mm fair quality target vessel.  The mammary was a 2 mm good quality conduit.  An end-to-side anastomosis was performed  with a running 8-0 Prolene suture in an end-to-side fashion.  At the completion of the mammary to LAD anastomosis, the bulldog clamp was briefly removed to inspect for hemostasis.  Rapid septal rewarming was noted. The bulldog clamp was replaced.  The mammary pedicle was tacked to the epicardial surface of the heart with 6-0 Prolene sutures.    Additional cardioplegia was administered.  The vein grafts were cut to length.  The cardioplegia cannula was removed from the ascending aorta.  The proximal anastomoses for the OM and posterior descending vein grafts were performed with 4.5 mm punch aortotomies with running 6-0 Prolene sutures.  There was not room on the aorta to place the diagonal proximal due to the calcification in the aorta  and having limited space for the proximals.  At the completion of the second proximal anastomosis, the patient was placed in Trendelenburg position.  Lidocaine was administered.  The bulldog clamp was again removed from the left mammary artery and rapid septal rewarming was again noted.  The aortic root was de-aired, the aortic crossclamp was removed.  The total crossclamp time was 75 minutes.  The patient spontaneously resumed sinus rhythm and did not require defibrillation.  Bulldog clamps were placed proximally and distally on the OM vein graft. A longitudinal venotomy was performed.  The proximal anastomosis for the diagonal vein was placed end-to-side to the OM vein near its origin with a running 7-0 Prolene suture.  The anastomosis was de-aired.  The bulldog clamps were removed and flow was reestablished.  While rewarming was completed, all proximal and distal anastomoses were inspected for hemostasis.  Epicardial pacing wires were placed on the right ventricle and right atrium and atrial pacing was initiated.  When the core temperature had risen to 37 degrees Celsius, the patient was weaned from cardiopulmonary bypass on the first attempt without inotropic support. The total bypass time was 132 minutes.  Postbypass transesophageal echocardiography revealed preserved left ventricular wall motion.  The initial cardiac index was greater than 2 L/minute/meter squared.  A test dose of protamine was administered and was well tolerated.  The atrial and aortic cannulae were removed.  The remainder of the protamine was administered without incident.  The chest was irrigated with warm saline.  Hemostasis was achieved.  A left pleural tube and 2 mediastinal tubes were placed.  A 36-French right-angle tube was placed in the left pleura, a 36-French straight chest tube was placed into the anterior mediastinum, and a 28-French Blake drain was placed into the posterior pericardium, all were  secured with #1 silk sutures.  The sternum was closed with a combination of single and double heavy gauge stainless steel wires.  The pectoralis fascia, subcutaneous tissue, and skin were closed in standard fashion.  All sponge, needle, and instrument counts were correct at the end of the procedure.  The patient was taken from the operating room to the Surgical Intensive Care Unit intubated and in good condition.     Revonda Standard Roxan Hockey, M.D.     SCH/MEDQ  D:  08/14/2013  T:  08/15/2013  Job:  [id number]

## 2013-08-16 ENCOUNTER — Inpatient Hospital Stay (HOSPITAL_COMMUNITY): Payer: Medicare HMO

## 2013-08-16 LAB — BASIC METABOLIC PANEL
Anion gap: 12 (ref 5–15)
BUN: 21 mg/dL (ref 6–23)
CO2: 25 meq/L (ref 19–32)
Calcium: 7.8 mg/dL — ABNORMAL LOW (ref 8.4–10.5)
Chloride: 102 mEq/L (ref 96–112)
Creatinine, Ser: 0.75 mg/dL (ref 0.50–1.35)
GFR, EST NON AFRICAN AMERICAN: 88 mL/min — AB (ref >90)
Glucose, Bld: 85 mg/dL (ref 70–99)
Potassium: 4.1 mEq/L (ref 3.7–5.3)
SODIUM: 139 meq/L (ref 137–147)

## 2013-08-16 LAB — GLUCOSE, CAPILLARY
GLUCOSE-CAPILLARY: 63 mg/dL — AB (ref 70–99)
Glucose-Capillary: 44 mg/dL — CL (ref 70–99)
Glucose-Capillary: 87 mg/dL (ref 70–99)
Glucose-Capillary: 58 mg/dL — ABNORMAL LOW (ref 70–99)
Glucose-Capillary: 83 mg/dL (ref 70–99)
Glucose-Capillary: 190 mg/dL — ABNORMAL HIGH (ref 70–99)
Glucose-Capillary: 199 mg/dL — ABNORMAL HIGH (ref 70–99)
Glucose-Capillary: 159 mg/dL — ABNORMAL HIGH (ref 70–99)

## 2013-08-16 LAB — CBC
HEMATOCRIT: 25.5 % — AB (ref 39.0–52.0)
Hemoglobin: 8.3 g/dL — ABNORMAL LOW (ref 13.0–17.0)
MCH: 27.6 pg (ref 26.0–34.0)
MCHC: 32.5 g/dL (ref 30.0–36.0)
MCV: 84.7 fL (ref 78.0–100.0)
PLATELETS: 138 10*3/uL — AB (ref 150–400)
RBC: 3.01 MIL/uL — ABNORMAL LOW (ref 4.22–5.81)
RDW: 16.2 % — AB (ref 11.5–15.5)
WBC: 6.6 10*3/uL (ref 4.0–10.5)

## 2013-08-16 MED ORDER — INSULIN ASPART 100 UNIT/ML ~~LOC~~ SOLN: 0.0000 [IU] | Freq: Three times a day (TID) | SUBCUTANEOUS | Status: DC

## 2013-08-16 MED ORDER — FUROSEMIDE 40 MG PO TABS
40.0000 mg | ORAL_TABLET | Freq: Every day | ORAL | Status: AC
  Administered 2013-08-17 – 2013-08-18 (×2): 40 mg via ORAL
  Filled 2013-08-16 (×2): qty 1

## 2013-08-16 MED ORDER — INSULIN ASPART 100 UNIT/ML ~~LOC~~ SOLN
0.0000 [IU] | Freq: Three times a day (TID) | SUBCUTANEOUS | Status: DC
  Administered 2013-08-16: 2 [IU] via SUBCUTANEOUS
  Administered 2013-08-16 (×2): 4 [IU] via SUBCUTANEOUS
  Administered 2013-08-17: 16 [IU] via SUBCUTANEOUS
  Administered 2013-08-17: 8 [IU] via SUBCUTANEOUS
  Administered 2013-08-18: 16 [IU] via SUBCUTANEOUS
  Administered 2013-08-18: 8 [IU] via SUBCUTANEOUS
  Administered 2013-08-18: 2 [IU] via SUBCUTANEOUS

## 2013-08-16 MED ORDER — POTASSIUM CHLORIDE CRYS ER 20 MEQ PO TBCR
20.0000 meq | EXTENDED_RELEASE_TABLET | Freq: Every day | ORAL | Status: DC
  Administered 2013-08-17 – 2013-08-18 (×2): 20 meq via ORAL
  Filled 2013-08-16 (×3): qty 1

## 2013-08-16 MED ORDER — SODIUM CHLORIDE 0.9 % IJ SOLN
3.0000 mL | Freq: Two times a day (BID) | INTRAMUSCULAR | Status: DC
  Administered 2013-08-16 – 2013-08-18 (×4): 3 mL via INTRAVENOUS

## 2013-08-16 MED ORDER — INSULIN DETEMIR 100 UNIT/ML ~~LOC~~ SOLN
14.0000 [IU] | Freq: Every day | SUBCUTANEOUS | Status: DC
  Administered 2013-08-16: 14 [IU] via SUBCUTANEOUS
  Filled 2013-08-16: qty 0.14

## 2013-08-16 MED ORDER — MAGNESIUM HYDROXIDE 400 MG/5ML PO SUSP: 30.0000 mL | Freq: Every day | ORAL | Status: DC | PRN

## 2013-08-16 MED ORDER — SODIUM CHLORIDE 0.9 % IV SOLN: 250.0000 mL | INTRAVENOUS | Status: DC | PRN

## 2013-08-16 MED ORDER — SODIUM CHLORIDE 0.9 % IJ SOLN: 3.0000 mL | INTRAMUSCULAR | Status: DC | PRN

## 2013-08-16 MED ORDER — FUROSEMIDE 10 MG/ML IJ SOLN
20.0000 mg | Freq: Once | INTRAMUSCULAR | Status: AC
  Administered 2013-08-16: 20 mg via INTRAVENOUS
  Filled 2013-08-16: qty 2

## 2013-08-16 MED ORDER — GLIMEPIRIDE 4 MG PO TABS
4.0000 mg | ORAL_TABLET | Freq: Every day | ORAL | Status: DC
  Administered 2013-08-16 – 2013-08-17 (×2): 4 mg via ORAL
  Filled 2013-08-16 (×4): qty 1

## 2013-08-16 MED ORDER — MOVING RIGHT ALONG BOOK
Freq: Once | Status: DC
  Filled 2013-08-16: qty 1

## 2013-08-16 NOTE — Progress Notes (Signed)
Patient ID: Joshua [name], male   DOB: 12-01-1940, 73 y.o.   MRN: [id number] EVENING ROUNDS NOTE :     [address]       [place] [address]             [phone]                 2 Days Post-Op Procedure(s) (LRB): CORONARY ARTERY BYPASS GRAFTING (CABG) (N/A) INTRAOPERATIVE TRANSESOPHAGEAL ECHOCARDIOGRAM (N/A)  Total Length of Stay:  LOS: 2 days  BP 105/51  Pulse 83  Temp(Src) 97.7 F (36.5 C) (Oral)  Resp 21  Ht 5' 10.75" (1.797 m)  Wt 209 lb 14.1 oz (95.2 kg)  BMI 29.48 kg/m2  SpO2 96%  .Intake/Output     08/12 0701 - 08/13 0700   P.O. 1560   I.V. (mL/kg) 20 (0.2)   IV Piggyback    Total Intake(mL/kg) 1580 (16.6)   Urine (mL/kg/hr) 2520 (2.1)   Chest Tube    Total Output 2520   Net -940         . sodium chloride       Lab Results  Component Value Date   WBC 6.6 08/16/2013   HGB 8.3* 08/16/2013   HCT 25.5* 08/16/2013   PLT 138* 08/16/2013   GLUCOSE 85 08/16/2013   CHOL 126 07/04/2013   TRIG 134 07/04/2013   HDL 36* 07/04/2013   LDLCALC 63 07/04/2013   ALT 14 08/11/2013   AST 13 08/11/2013   NA 139 08/16/2013   K 4.1 08/16/2013   CL 102 08/16/2013   CREATININE 0.75 08/16/2013   BUN 21 08/16/2013   CO2 25 08/16/2013   TSH 0.918 07/01/2013   PSA 1.33 09/01/2011   INR 1.40 08/14/2013   HGBA1C 10.5* 08/11/2013   MICROALBUR 2.05* 07/07/2012   Stable day, waiting for step down bed  Grace Isaac MD  Beeper [phone] Office [phone] 08/16/2013 7:34 PM

## 2013-08-16 NOTE — Progress Notes (Signed)
2 Days Post-Op Procedure(s) (LRB): CORONARY ARTERY BYPASS GRAFTING (CABG) (N/A) INTRAOPERATIVE TRANSESOPHAGEAL ECHOCARDIOGRAM (N/A) Subjective: Doing well OOB to chair NSR  Objective: Vital signs in last 24 hours: Temp:  [97.3 F (36.3 C)-98.4 F (36.9 C)] 97.3 F (36.3 C) (08/12 0744) Pulse Rate:  [82-91] 90 (08/12 0800) Cardiac Rhythm:  [-] Normal sinus rhythm (08/12 0800) Resp:  [13-22] 17 (08/12 0800) BP: (81-130)/(38-55) 107/44 mmHg (08/12 0800) SpO2:  [90 %-99 %] 95 % (08/12 0800) Arterial Line BP: (105-148)/(29-46) 105/33 mmHg (08/11 1600) Weight:  [209 lb 14.1 oz (95.2 kg)] 209 lb 14.1 oz (95.2 kg) (08/12 0600)  Hemodynamic parameters for last 24 hours: PAP: (29-35)/(11-16) 35/16 mmHg  Intake/Output from previous day: 08/11 0701 - 08/12 0700 In: 736 [I.V.:636; IV Piggyback:100] Out: 2560 [Urine:2530; Chest Tube:30] Intake/Output this shift: Total I/O In: 20 [I.V.:20] Out: -   Neuro intact Lungs wet  Lab Results:  Recent Labs  08/15/13 1700 08/16/13 0430  WBC 7.6 6.6  HGB 8.5* 8.3*  HCT 25.9* 25.5*  PLT 137* 138*   BMET:  Recent Labs  08/15/13 0415 08/15/13 1642 08/15/13 1700 08/16/13 0430  NA 136* 135*  --  139  K 3.6* 4.0  --  4.1  CL 101 98  --  102  CO2 22  --   --  25  GLUCOSE 112* 159*  --  85  BUN 20 20  --  21  CREATININE 0.61 0.90 0.78 0.75  CALCIUM 7.6*  --   --  7.8*    PT/INR:  Recent Labs  08/14/13 1300  LABPROT 17.2*  INR 1.40   ABG    Component Value Date/Time   PHART 7.408 08/15/2013 0420   HCO3 23.2 08/15/2013 0420   TCO2 21 08/15/2013 1642   ACIDBASEDEF 1.0 08/15/2013 0420   O2SAT 95.0 08/15/2013 0420   CBG (last 3)   Recent Labs  08/16/13 0339 08/16/13 0358 08/16/13 0423  GLUCAP 44* 63* 87    Assessment/Plan: S/P Procedure(s) (LRB): CORONARY ARTERY BYPASS GRAFTING (CABG) (N/A) INTRAOPERATIVE TRANSESOPHAGEAL ECHOCARDIOGRAM (N/A) tx stepdown Resume DM meds slowly Cont diuresis   LOS: 2 days    VAN  TRIGT III,PETER 08/16/2013

## 2013-08-16 NOTE — Progress Notes (Signed)
Inpatient Diabetes Program Recommendations  AACE/ADA: New Consensus Statement on Inpatient Glycemic Control (2013)  Target Ranges:  Prepandial:   less than 140 mg/dL      Peak postprandial:   less than 180 mg/dL (1-2 hours)      Critically ill patients:  140 - 180 mg/dL   Reason for Assessment:  Results for SANAY, BELMAR (MRN [id number]) as of 08/16/2013 12:03  Ref. Range 08/16/2013 03:39 08/16/2013 03:58 08/16/2013 04:23 08/16/2013 07:38 08/16/2013 08:04  Glucose-Capillary Latest Range: 70-99 mg/dL 44 (LL) 63 (L) 87 58 (L) 83   Diabetes history: Type 2 diabetes Outpatient Diabetes medications: Levemir 30-39 units q HS, Amaryl 4 mg bid Current orders for Inpatient glycemic control: Levemir 14 units q HS, Novolog 3 units tid with meals, Amaryl 4 mg with breakfast  Note that patient received a total of 61 units of Levemir on 8/11.  CBG's decreased today.  May consider increasing Levemir for tonight to 30 units q HS.  Also consider restarting Novolog moderate correction tid with meals and bedtime scale.   Will follow. Thanks, Adah Perl, RN, BC-ADM Inpatient Diabetes Coordinator Pager [phone]

## 2013-08-16 NOTE — Progress Notes (Signed)
Hypoglycemic Event  CBG: 44  Treatment: 16oz Orange juice  Symptoms: Sweaty  Follow-up CBG: Time0358/0423 CBG Result:63/87  Possible Reasons for Event: increase in levemir  Comments/MD notified: hypoglycemic protocol followed    Kendra Opitz  Remember to initiate Hypoglycemia Order Set & complete

## 2013-08-17 ENCOUNTER — Encounter (HOSPITAL_COMMUNITY): Payer: Self-pay | Admitting: Thoracic Surgery (Cardiothoracic Vascular Surgery)

## 2013-08-17 ENCOUNTER — Inpatient Hospital Stay (HOSPITAL_COMMUNITY): Payer: Medicare HMO

## 2013-08-17 LAB — BASIC METABOLIC PANEL
Anion gap: 10 (ref 5–15)
BUN: 23 mg/dL (ref 6–23)
CO2: 29 mEq/L (ref 19–32)
Calcium: 8.2 mg/dL — ABNORMAL LOW (ref 8.4–10.5)
Chloride: 102 mEq/L (ref 96–112)
Creatinine, Ser: 0.76 mg/dL (ref 0.50–1.35)
GFR calc Af Amer: >90 mL/min (ref >90)
GFR calc non Af Amer: 88 mL/min — ABNORMAL LOW (ref >90)
Glucose, Bld: 73 mg/dL (ref 70–99)
Potassium: 4.2 mEq/L (ref 3.7–5.3)
Sodium: 141 mEq/L (ref 137–147)

## 2013-08-17 LAB — CBC
HCT: 28 % — ABNORMAL LOW (ref 39.0–52.0)
Hemoglobin: 9 g/dL — ABNORMAL LOW (ref 13.0–17.0)
MCH: 27.6 pg (ref 26.0–34.0)
MCHC: 32.1 g/dL (ref 30.0–36.0)
MCV: 85.9 fL (ref 78.0–100.0)
Platelets: 177 10*3/uL (ref 150–400)
RBC: 3.26 MIL/uL — ABNORMAL LOW (ref 4.22–5.81)
RDW: 16.2 % — ABNORMAL HIGH (ref 11.5–15.5)
WBC: 7.3 10*3/uL (ref 4.0–10.5)

## 2013-08-17 LAB — GLUCOSE, CAPILLARY
GLUCOSE-CAPILLARY: 60 mg/dL — AB (ref 70–99)
GLUCOSE-CAPILLARY: 95 mg/dL (ref 70–99)
Glucose-Capillary: 228 mg/dL — ABNORMAL HIGH (ref 70–99)
Glucose-Capillary: 326 mg/dL — ABNORMAL HIGH (ref 70–99)
Glucose-Capillary: 81 mg/dL (ref 70–99)

## 2013-08-17 MED ORDER — RAMIPRIL 10 MG PO CAPS
10.0000 mg | ORAL_CAPSULE | Freq: Every day | ORAL | Status: DC
  Administered 2013-08-18: 10 mg via ORAL
  Filled 2013-08-17 (×2): qty 1

## 2013-08-17 NOTE — Progress Notes (Signed)
3 Days Post-Op Procedure(s) (LRB): CORONARY ARTERY BYPASS GRAFTING (CABG) (N/A) INTRAOPERATIVE TRANSESOPHAGEAL ECHOCARDIOGRAM (N/A) Subjective: Feels well this AM Pain well controlled  Objective: Vital signs in last 24 hours: Temp:  [97.7 F (36.5 C)-98.5 F (36.9 C)] 98.1 F (36.7 C) (08/13 0345) Pulse Rate:  [76-91] 85 (08/13 0737) Cardiac Rhythm:  [-] Normal sinus rhythm (08/13 0730) Resp:  [13-25] 20 (08/13 0737) BP: (89-122)/(41-63) 108/51 mmHg (08/13 0600) SpO2:  [93 %-97 %] 95 % (08/13 0737) Weight:  [206 lb 5.6 oz (93.6 kg)] 206 lb 5.6 oz (93.6 kg) (08/13 0500)  Hemodynamic parameters for last 24 hours:    Intake/Output from previous day: 08/12 0701 - 08/13 0700 In: [place] [P.O.:1740; I.V.:20] Out: 4220 [Urine:4220] Intake/Output this shift:    General appearance: alert and no distress Neurologic: intact Heart: regular rate and rhythm Lungs: diminished breath sounds bibasilar Abdomen: normal findings: soft, non-tender Wound: clean and dry  Lab Results:  Recent Labs  08/16/13 0430 08/17/13 0307  WBC 6.6 7.3  HGB 8.3* 9.0*  HCT 25.5* 28.0*  PLT 138* 177   BMET:  Recent Labs  08/16/13 0430 08/17/13 0307  NA 139 141  K 4.1 4.2  CL 102 102  CO2 25 29  GLUCOSE 85 73  BUN 21 23  CREATININE 0.75 0.76  CALCIUM 7.8* 8.2*    PT/INR:  Recent Labs  08/14/13 1300  LABPROT 17.2*  INR 1.40   ABG    Component Value Date/Time   PHART 7.408 08/15/2013 0420   HCO3 23.2 08/15/2013 0420   TCO2 21 08/15/2013 1642   ACIDBASEDEF 1.0 08/15/2013 0420   O2SAT 95.0 08/15/2013 0420   CBG (last 3)   Recent Labs  08/16/13 1235 08/16/13 1651 08/16/13 2159  GLUCAP 190* 199* 159*    Assessment/Plan: S/P Procedure(s) (LRB): CORONARY ARTERY BYPASS GRAFTING (CABG) (N/A) INTRAOPERATIVE TRANSESOPHAGEAL ECHOCARDIOGRAM (N/A) Plan for transfer to step-down: see transfer orders  CV- stable in SR  RESP- CXR shows small bilateral effusions and mild bibasilar  atelectasis  RENAL- diuresed well, continue PO lasix  ENDO- CBG were high yesterday afternoon but then very low overnight  levemir held last night and still hypoglycemic this AM  Likely a bit of a moving target with perioperative steroids  Hold levemir for now and follow  Ambulating well  Transfer to 2000  Probably home in next 24-48 hours  LOS: 3 days    Joshua [name] C 08/17/2013

## 2013-08-17 NOTE — Progress Notes (Signed)
Inpatient Diabetes Program Recommendations  AACE/ADA: New Consensus Statement on Inpatient Glycemic Control (2013)  Target Ranges:  Prepandial:   less than 140 mg/dL      Peak postprandial:   less than 180 mg/dL (1-2 hours)      Critically ill patients:  140 - 180 mg/dL   Reason for Assessment:  Spoke to patient regarding elevated A1C of 10.5%. He states that he knows that his CBG's were running high.  Daughter states that he had been on steroids in June which she wondered may have effected his CBG's.  Also he states that he quit smoking which made him want to eat more.  Gave him handout on A1C and standards of care with diabetes.  Encouraged him to follow-up with his PCP regarding his diabetes.  Will follow. Adah Perl, RN, BC-ADM Inpatient Diabetes Coordinator Pager [phone]

## 2013-08-17 NOTE — Progress Notes (Signed)
CARDIAC REHAB PHASE I   PRE:  Rate/Rhythm: 102 ST  BP:  Supine:   Sitting: 126/40  Standing:    SaO2: 97 RA  MODE:  Ambulation: 500 ft   POST:  Rate/Rhythm: 104  BP:  Supine:   Sitting: 124/40  Standing:    SaO2: 96 RA 1405- 1440 Assisted X 1 and used walker to ambulate. Gait steady with walker. Pt able to walk 500 feet without c/o. VS stable Pt to side of bed after walk with call light in reach and daughter present. We did discuss Outpt. CRP, he is considering it. We will follow pt tomorrow.  Rodney Langton RN 08/17/2013 2:41 PM

## 2013-08-17 NOTE — Discharge Summary (Signed)
Physician Discharge Summary  Patient ID: DOC MANDALA MRN: [id number] DOB/AGE: [birth year] 73 y.o.  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 He was hospitalized in late June of this year with pericarditis. He presented with left shoulder and back pain as well as general malaise. He was diagnosed with pericarditis. He had a pericardiocentesis and was also treated with steroids. He was noted to have severe  coronary calcification by catheterization was postponed to avoid using blood thinners in the setting of a bloody effusion. Since discharge he is felt better but still continued to have left shoulder pain radiating to his back and under shoulder blade and also into the left flank. These pains do occur with exertion but also at rest. He also continued to have generalized malaise and lack of energy. He was felt to require further evaluation and treatment to include cardiac catheterization to better delineate his coronary anatomy.  Past Medical History   Diagnosis  Date   .  Hypertension    .  Tobacco dependence      Prior failure to quit with wellbutrin. 100+ pack-yr history. Quit for 1 mo to get hip replacement (1 pack/day)   .  Obesity    .  Hyperplastic colon polyp  2001   .  Erectile dysfunction      Normal testosterone   .  Adenomatous colon polyp  10/16/2011     Repeat 2018   .  Chronic atrophic gastritis  02/25/12     gastric bx: +intestinal metaplasia, h. pylori neg, no dysplasia or malignancy.   .  Macular degeneration, dry      Mild, bilat (Optometrist, Mayford Knife at [facility] in [place], [place])   .  Iron deficiency anemia  2014     03/2012 capsule endoscopy showed 2 AVMs--likely responsible for his  IDA--lifetime iron supp recommended + q68moCBCs.   .  Arthritis      Hips, R>L & KNEES   .  COPD (chronic obstructive pulmonary disease)      Spirometry 2004 borderline obstruction   .  Hyperlipidemia    .  Pericardial effusion with cardiac tamponade  6//29/15     Infectious/inflamm (cytology showed NO MALIGNANT CELLS)   .  Other and unspecified angina pectoris    .  DOE (dyspnea on exertion)    .  Pneumonia    .  DM type 2 (diabetes mellitus, type 2)      Poor control on max oral meds--pt eventually agreed to insulin therapy   .  CKD (chronic kidney disease), stage I    .  Diabetic nephropathy      Elevated urine microalb/cr 03/2011    Past Surgical History   Procedure  Laterality  Date   .  Cholecystectomy open   1999   .  Appendectomy   1957   .  Hip surgery  Right  1954     Repair of slipped capital femoral epiphysis.   .  Patella fracture surgery  Left  ~ 1979     bolt + 3 screws to repair tib plateau fx   .  Testicle surgery   as a child     Undescended testicle brought down into scrotum   .  Tonsillectomy   1947   .  Colonoscopy   10/16/2011     Procedure: COLONOSCOPY; Surgeon: Inda Castle, MD; Location: [facility]; Service: Endoscopy; Laterality: N/A;   .  Cataract extraction w/ intraocular lens implant, bilateral   04/08/2006 & 04/22/2006   .  Esophagogastroduodenoscopy   02/25/12     Atrophic gastritis with a few erosions--capsule endoscopy planned as of 02/25/12 (Dr. Deatra Ina).   .  Total hip arthroplasty  Right  08/12/2012     Procedure: REMOVAL OF OLD PINS RIGHT HIP AND RIGHT TOTAL HIP ARTHROPLASTY ANTERIOR APPROACH; Surgeon: Mcarthur Rossetti, MD; Location: [facility]; Service: Orthopedics; Laterality: Right;   .  Hardware removal  Right  08/12/2012     Procedure: HARDWARE REMOVAL; Surgeon: Mcarthur Rossetti, MD; Location: [facility]; Service: Orthopedics; Laterality: Right;   .  Cardiac catheterization   08/08/2013   .  Cataract extraction w/ intraocular lens implant,  bilateral  Bilateral     Family History   Problem  Relation  Age of Onset   .  Breast cancer  Sister    .  Diabetes  Maternal Uncle    .  Diabetes  Paternal Grandmother    .  Heart disease  Father    .  Heart disease  Maternal Uncle    .  CVA  Mother     Social History: reports that he quit smoking about 5 weeks ago. His smoking use included Cigarettes. He has a 62 pack-year smoking history. He quit smokeless tobacco use about 5 weeks ago. He reports that he does not drink alcohol or use illicit drugs.  Allergies:  Allergies   Allergen  Reactions   .  Morphine And Related  Other (See Comments)     Drenched with perspiration   .  Demerol [Meperidine]  Nausea Only   .  Starlix [Nateglinide]  Other (See Comments)     gassy    Medications:  Prior to Admission:  Prescriptions prior to admission   Medication  Sig  Dispense  Refill   .  aspirin 81 MG tablet  Take 81 mg by mouth daily.     .  budesonide-formoterol (SYMBICORT) 160-4.5 MCG/ACT inhaler  Inhale 2 puffs into the lungs 2 (two) times daily.  1 Inhaler  12   .  buPROPion (WELLBUTRIN SR) 150 MG 12 hr tablet  Take 150 mg by mouth daily.     .  chlorthalidone (HYGROTON) 25 MG tablet  Take 1 tablet (25 mg total) by mouth daily.  30 tablet  10   .  Cholecalciferol (VITAMIN D-3) 1000 UNITS CAPS  Take 1,000 Units by mouth daily.     .  Chromium Picolinate 1000 MCG TABS  Take 1 tablet by mouth daily.     .  ferrous sulfate 325 (65 FE) MG tablet  Take 325 mg by mouth daily with breakfast.     .  glimepiride (AMARYL) 4 MG tablet  Take 1 tablet (4 mg total) by mouth 2 (two) times daily.  180 tablet  1   .  Insulin Detemir (LEVEMIR FLEXPEN) 100 UNIT/ML Pen  Inject 39 Units into the skin at bedtime.     .  lovastatin (MEVACOR) 40 MG tablet  Take 1 tablet (40 mg total) by mouth 2 (two) times daily.  180 tablet  1   .  metFORMIN (GLUCOPHAGE) 1000 MG tablet  Take 1 tablet (1,000 mg total) by mouth 2 (two) times daily with a meal.  180 tablet  1     .  Multiple Vitamin (MULTIVITAMIN WITH MINERALS) TABS  Take 1 tablet by mouth daily.     .  nicotine (NICODERM CQ - DOSED IN MG/24 HR) 7 mg/24hr patch  Place 7 mg onto the skin every other day.     .  pioglitazone (ACTOS) 45 MG tablet  Take 1 tablet (45 mg total) by mouth daily.  90 tablet  1   .  ramipril (ALTACE) 10 MG capsule  Take 1 capsule (10 mg total) by mouth 2 (two) times daily.  180 capsule  1      Discharge Diagnoses:  Active Problems:   S/P CABG x 4  expected acute blood loss anemia  Discharged Condition: good  Hospital Course: The patient was admitted electively for cardiac catheterization. He was found to have severe three-vessel coronary artery disease and recommendations were made for surgical revascularization. Consultation was obtained with Modesto Charon M.D. who evaluated the patient and his studies and agreed with recommendations.  Consults: Cardiothoracic surgery  Significant Diagnostic Studies: Cardiac catheterization  PROCEDURE: Left heart catheterization with selective coronary angiography.  INDICATIONS: Class III angina; DOE  The risks, benefits, and details of the procedure were explained to the patient. The patient verbalized understanding and wanted to proceed. Informed written consent was obtained.  PROCEDURE TECHNIQUE: After Xylocaine anesthesia a 64F slender sheath was placed in the right radial artery with a single anterior needle wall stick. Right coronary angiography was done using a Judkins R4 guide catheter. Left coronary angiography was done using a Judkins L3.5 guide catheter. Left ventriculography was done using a pigtail catheter. A TR band was used for hemostasis.  CONTRAST: Total of 65 cc.  COMPLICATIONS: None.  HEMODYNAMICS: Aortic pressure was 131/61; LV pressure was 126/12; LVEDP 18. There was no gradient between the left ventricle and aorta.  ANGIOGRAPHIC DATA: The left main coronary artery is patent proximally. There is a distal 50%  stenosis which seems to extend into the ostial LAD.  The left anterior descending artery is a large vessel which wraps around the apex. The proximal vessel is heavily calcified. There is an 80% proximal stenosis. There is a large first diagonal which has an 80% ostial stenosis. Just beyond the diagonal, there is a 70% LAD stenosis. There is a medium size diagonal which then originates in his patent. The remainder of the mid to distal LAD is widely patent.  The left circumflex artery is occluded at the ostium. There are left to left collaterals which fill large obtuse marginal vessel and circumflex vessel.  The right coronary artery is a large dominant vessel. In the midportion, there is a focal 80%, calcific stenosis. After the origin of a large RV marginal branch, there is a focal 75% lesion which is heavily calcified. There is moderate, calcific atherosclerosis throughout the distal RCA with a thick layer of visible calcium.  LEFT VENTRICULOGRAM: Left ventricular angiogram was not done. Recent echocardiogram showed normal left ventricular function. LVEDP was 18 mmHg.  IMPRESSIONS:  1. Severe three-vessel disease as noted above. 2. Normal left ventricular systolic function by recent echocardiogram. LVEDP 18 mmHg.  RECOMMENDATION: Plan cardiac surgery consultation.   Treatments: surgery:  DATE OF PROCEDURE: 08/14/2013  DATE OF DISCHARGE:  OPERATIVE REPORT  PREOPERATIVE DIAGNOSIS: Severe three-vessel coronary disease.  POSTOPERATIVE DIAGNOSIS: Severe three-vessel coronary disease.  PROCEDURE: Median sternotomy, extracorporeal circulation, coronary  artery bypass grafting x4 (left internal mammary artery to left anterior  descending, saphenous vein graft to  first diagonal, saphenous vein graft  to obtuse marginal, saphenous vein graft to posterior descending),  endoscopic vein harvest, both thighs.  SURGEON: Revonda Standard. Roxan Hockey, M.D.  ASSISTANT: Ellwood Handler, PA.  ANESTHESIA: General.    FINDINGS: Fair quality target vessels, good quality conduits, preserved  left ventricular function by TEE, severe inflammatory adhesions and  exudate of the pericardium and epicardium.   Post operative hospital course: Overall the patient has progressed nicely. He has remained hemodynamically stable. He is neurologically intact. He was weaned from the ventilator without difficulty using standard protocols. All routine lines, monitors and drainage devices have been discontinued in standard fashion. He does a mild acute blood loss anemia which is stable. Incisions are healing well evidence of infection. He is tolerating gradually increasing activities using standard protocols. Diabetes has been under good control using standard protocols.  He is maintaining NSR and his pacing wires have been removed without difficulty.  He is ambulating without assistance.  He is felt medically stable for discharge today 08/19/2013.   Disposition: 01-Home or Self Care      Discharge Instructions   Amb Referral to Cardiac Rehabilitation    Complete by:  As directed           Medications at time of discharge:    Medication List    STOP taking these medications       aspirin 81 MG tablet  Replaced by:  aspirin 325 MG EC tablet     chlorthalidone 25 MG tablet  Commonly known as:  HYGROTON     isosorbide mononitrate 30 MG 24 hr tablet  Commonly known as:  IMDUR     nitroGLYCERIN 0.4 MG SL tablet  Commonly known as:  NITROSTAT      TAKE these medications       acetaminophen 325 MG tablet  Commonly known as:  TYLENOL  Take 2 tablets (650 mg total) by mouth every 4 (four) hours as needed for headache or mild pain.     aspirin 325 MG EC tablet  Take 1 tablet (325 mg total) by mouth daily.     budesonide-formoterol 160-4.5 MCG/ACT inhaler  Commonly known as:  SYMBICORT  Inhale 2 puffs into the lungs 2 (two) times daily.     buPROPion 150 MG 12 hr tablet  Commonly known as:  WELLBUTRIN SR   Take 150 mg by mouth 2 (two) times daily.     Chromium Picolinate 1000 MCG Tabs  Take 1,000 mcg by mouth daily.     ferrous sulfate 325 (65 FE) MG tablet  Take 325 mg by mouth daily with breakfast.     glimepiride 4 MG tablet  Commonly known as:  AMARYL  Take 1 tablet (4 mg total) by mouth 2 (two) times daily.     LEVEMIR FLEXPEN 100 UNIT/ML Pen  Generic drug:  Insulin Detemir  Inject 30-39 Units into the skin at bedtime.     lovastatin 40 MG tablet  Commonly known as:  MEVACOR  Take 1 tablet (40 mg total) by mouth 2 (two) times daily.     metFORMIN 1000 MG tablet  Commonly known as:  GLUCOPHAGE  Take 1 tablet (1,000 mg total) by mouth 2 (two) times daily with a meal.     metoprolol tartrate 25 MG tablet  Commonly known as:  LOPRESSOR  Take 0.5 tablets (12.5 mg total) by mouth 2 (two) times daily.     multivitamin with minerals Tabs tablet  Take 1 tablet by mouth daily.  nicotine 7 mg/24hr patch  Commonly known as:  NICODERM CQ - dosed in mg/24 hr  Place 7 mg onto the skin every other day.     oxyCODONE 5 MG immediate release tablet  Commonly known as:  Oxy IR/ROXICODONE  Take 1-2 tablets (5-10 mg total) by mouth every 3 (three) hours as needed for moderate pain.     pioglitazone 45 MG tablet  Commonly known as:  ACTOS  Take 1 tablet (45 mg total) by mouth daily.     ramipril 10 MG capsule  Commonly known as:  ALTACE  Take 1 capsule (10 mg total) by mouth daily.     tamsulosin 0.4 MG Caps capsule  Commonly known as:  FLOMAX  Take 1 capsule (0.4 mg total) by mouth daily after supper.     Vitamin D-3 1000 UNITS Caps  Take 1,000 Units by mouth daily.          Follow-up Information   Follow up with Melrose Nakayama, MD. (09/19/2013 12:45 PM to see the surgeon. Please obtain a chest x-ray at [facility] at 11:45 AM. [facility] imaging is located in the same office complex .)    Specialty:  Cardiothoracic Surgery   Contact information:   [place] [address] [phone]       Follow up with Richardson Dopp, PA-C On 09/08/2013. (Appointment is at 9:50)    Specialty:  Physician Assistant   Contact information:   [place]. [address] [place] [address] [phone]      The patient has been discharged on:   1.Beta Blocker:  Yes [ y  ]                              No   [   ]                              If No, reason:  2.Ace Inhibitor/ARB: Yes [ y  ]                                     No  [    ]                                     If No, reason:  3.Statin:   Yes Blue.Reese   ]                  No  [   ]                  If No, reason:  4.Ecasa:  Yes  [ y  ]                  No   [   ]                  If No, reason:  Signed: BARRETT, ERIN 08/19/2013, 8:46 AM

## 2013-08-17 NOTE — Discharge Instructions (Signed)
Endoscopic Saphenous Vein Harvesting Care After Refer to this sheet in the next few weeks. These instructions provide you with information on caring for yourself after your procedure. Your health care provider may also give you more specific instructions. Your treatment has been planned according to current medical practices, but problems sometimes occur. Call your health care provider if you have any problems or questions after your procedure. HOME CARE INSTRUCTIONS Medicine  Take whatever pain medicine your surgeon prescribes. Follow the directions carefully. Do not take over-the-counter pain medicine unless your surgeon says it is okay. Some pain medicine can cause bleeding problems for several weeks after surgery.  Follow your surgeon's instructions about driving. You will probably not be permitted to drive after heart surgery.  Take any medicines your surgeon prescribes. Any medicines you took before your heart surgery should be checked with your health care provider before you start taking them again. Wound care  If your surgeon has prescribed an elastic bandage or stocking, ask how long you should wear it.  Check the area around your surgical cuts (incisions) whenever your bandages (dressings) are changed. Look for any redness or swelling.  You will need to return to have the stitches (sutures) or staples taken out. Ask your surgeon when to do that.  Ask your surgeon when you can shower or bathe. Activity  Try to keep your legs raised when you are sitting.  Do any exercises your health care providers have given you. These may include deep breathing exercises, coughing, walking, or other exercises. SEEK MEDICAL CARE IF:  You have any questions about your medicines.  You have more leg pain, especially if your pain medicine stops working.  New or growing bruises develop on your leg.  Your leg swells, feels tight, or becomes red.  You have numbness in your leg. SEEK IMMEDIATE  MEDICAL CARE IF:  Your pain gets much worse.  Blood or fluid leaks from any of the incisions.  Your incisions become warm, swollen, or red.  You have chest pain.  You have trouble breathing.  You have a fever.  You have more pain near your leg incision. MAKE SURE YOU:  Understand these instructions.  Will watch your condition.  Will get help right away if you are not doing well or get worse. Document Released: 09/03/2010 Document Revised: 12/27/2012 Document Reviewed: 09/03/2010 [facility] Patient Information 2015 [place], [place]. This information is not intended to replace advice given to you by your health care provider. Make sure you discuss any questions you have with your health care provider. Coronary Artery Bypass Grafting, Care After Refer to this sheet in the next few weeks. These instructions provide you with information on caring for yourself after your procedure. Your health care provider may also give you more specific instructions. Your treatment has been planned according to current medical practices, but problems sometimes occur. Call your health care provider if you have any problems or questions after your procedure. WHAT TO EXPECT AFTER THE PROCEDURE Recovery from surgery will be different for everyone. Some people feel well after 3 or 4 weeks, while for others it takes longer. After your procedure, it is typical to have the following:  Nausea and a lack of appetite.   Constipation.  Weakness and fatigue.   Depression or irritability.   Pain or discomfort at your incision site. HOME CARE INSTRUCTIONS  Take medicines only as directed by your health care provider. Do not stop taking medicines or start any new medicines without first checking  with your health care provider.  Take your pulse as directed by your health care provider.  Perform deep breathing as directed by your health care provider. If you were given a device called an incentive spirometer,  use it to practice deep breathing several times a day. Support your chest with a pillow or your arms when you take deep breaths or cough.  Keep incision areas clean, dry, and protected. Remove or change any bandages (dressings) only as directed by your health care provider. You may have skin adhesive strips over the incision areas. Do not take the strips off. They will fall off on their own.  Check incision areas daily for any swelling, redness, or drainage.  If incisions were made in your legs, do the following:  Avoid crossing your legs.   Avoid sitting for long periods of time. Change positions every 30 minutes.   Elevate your legs when you are sitting.  Wear compression stockings as directed by your health care provider. These stockings help keep blood clots from forming in your legs.  Take showers once your health care provider approves. Until then, only take sponge baths. Pat incisions dry. Do not rub incisions with a washcloth or towel. Do not take baths, swim, or use a hot tub until your health care provider approves.  Eat foods that are high in fiber, such as raw fruits and vegetables, whole grains, beans, and nuts. Meats should be lean cut. Avoid canned, processed, and fried foods.  Drink enough fluid to keep your urine clear or pale yellow.  Weigh yourself every day. This helps identify if you are retaining fluid that may make your heart and lungs work harder.  Rest and limit activity as directed by your health care provider. You may be instructed to:  Stop any activity at once if you have chest pain, shortness of breath, irregular heartbeats, or dizziness. Get help right away if you have any of these symptoms.  Move around frequently for short periods or take short walks as directed by your health care provider. Increase your activities gradually. You may need physical therapy or cardiac rehabilitation to help strengthen your muscles and build your endurance.  Avoid  lifting, pushing, or pulling anything heavier than 10 lb (4.5 kg) for at least 6 weeks after surgery.  Do not drive until your health care provider approves.  Ask your health care provider when you may return to work.  Ask your health care provider when you may resume sexual activity.  Keep all follow-up visits as directed by your health care provider. This is important. SEEK MEDICAL CARE IF:  You have swelling, redness, increasing pain, or drainage at the site of an incision.  You have a fever.  You have swelling in your ankles or legs.  You have pain in your legs.   You gain 2 or more pounds (0.9 kg) a day.  You are nauseous or vomit.  You have diarrhea. SEEK IMMEDIATE MEDICAL CARE IF:  You have chest pain that goes to your jaw or arms.  You have shortness of breath.   You have a fast or irregular heartbeat.   You notice a "clicking" in your breastbone (sternum) when you move.   You have numbness or weakness in your arms or legs.  You feel dizzy or light-headed.  MAKE SURE YOU:  Understand these instructions.  Will watch your condition.  Will get help right away if you are not doing well or get worse. Document Released: 07/11/2004 Document  Revised: 05/08/2013 Document Reviewed: 05/31/2012 [facility] Patient Information 2015 [place], [place]. This information is not intended to replace advice given to you by your health care provider. Make sure you discuss any questions you have with your health care provider.

## 2013-08-18 LAB — GLUCOSE, CAPILLARY
GLUCOSE-CAPILLARY: 147 mg/dL — AB (ref 70–99)
GLUCOSE-CAPILLARY: 212 mg/dL — AB (ref 70–99)
Glucose-Capillary: 308 mg/dL — ABNORMAL HIGH (ref 70–99)
Glucose-Capillary: 142 mg/dL — ABNORMAL HIGH (ref 70–99)

## 2013-08-18 MED ORDER — GLIMEPIRIDE 4 MG PO TABS
4.0000 mg | ORAL_TABLET | Freq: Two times a day (BID) | ORAL | Status: DC
  Administered 2013-08-18 – 2013-08-19 (×2): 4 mg via ORAL
  Filled 2013-08-18 (×4): qty 1

## 2013-08-18 MED ORDER — METFORMIN HCL 500 MG PO TABS
1000.0000 mg | ORAL_TABLET | Freq: Two times a day (BID) | ORAL | Status: DC
  Administered 2013-08-18 – 2013-08-19 (×2): 1000 mg via ORAL
  Filled 2013-08-18 (×4): qty 2

## 2013-08-18 MED ORDER — PIOGLITAZONE HCL 45 MG PO TABS
45.0000 mg | ORAL_TABLET | Freq: Every day | ORAL | Status: DC
  Administered 2013-08-18: 45 mg via ORAL
  Filled 2013-08-18 (×2): qty 1

## 2013-08-18 NOTE — Progress Notes (Signed)
[id number] Cardiac Rehab Completed discharge education with pt and sister. He voices understanding. Pt agrees to [place]. CRP in [place], will send referral. Deon Pilling, RN 08/18/2013 10:43 AM

## 2013-08-18 NOTE — Progress Notes (Signed)
4 Days Post-Op Procedure(s) (LRB): CORONARY ARTERY BYPASS GRAFTING (CABG) (N/A) INTRAOPERATIVE TRANSESOPHAGEAL ECHOCARDIOGRAM (N/A) Subjective: No complaints Has had BM Ambulating well  Objective: Vital signs in last 24 hours: Temp:  [98.1 F (36.7 C)-98.3 F (36.8 C)] 98.1 F (36.7 C) (08/14 0419) Pulse Rate:  [78-98] 78 (08/14 0419) Cardiac Rhythm:  [-]  Resp:  [18-20] 20 (08/14 0419) BP: (107-118)/(50-55) 112/55 mmHg (08/14 0419) SpO2:  [93 %-98 %] 93 % (08/14 0838) Weight:  [205 lb 14.4 oz (93.396 kg)] 205 lb 14.4 oz (93.396 kg) (08/14 0419)  Hemodynamic parameters for last 24 hours:    Intake/Output from previous day: 08/13 0701 - 08/14 0700 In: 1080 [P.O.:1080] Out: -  Intake/Output this shift:    General appearance: alert and no distress Neurologic: intact Heart: regular rate and rhythm Lungs: clear to auscultation bilaterally Abdomen: normal findings: soft, non-tender Wound: clean and dry  Lab Results:  Recent Labs  08/16/13 0430 08/17/13 0307  WBC 6.6 7.3  HGB 8.3* 9.0*  HCT 25.5* 28.0*  PLT 138* 177   BMET:  Recent Labs  08/16/13 0430 08/17/13 0307  NA 139 141  K 4.1 4.2  CL 102 102  CO2 25 29  GLUCOSE 85 73  BUN 21 23  CREATININE 0.75 0.76  CALCIUM 7.8* 8.2*    PT/INR: No results found for this basename: LABPROT, INR,  in the last 72 hours ABG    Component Value Date/Time   PHART 7.408 08/15/2013 0420   HCO3 23.2 08/15/2013 0420   TCO2 21 08/15/2013 1642   ACIDBASEDEF 1.0 08/15/2013 0420   O2SAT 95.0 08/15/2013 0420   CBG (last 3)   Recent Labs  08/17/13 1803 08/17/13 2112 08/18/13 0625  GLUCAP 326* 81 147*    Assessment/Plan: S/P Procedure(s) (LRB): CORONARY ARTERY BYPASS GRAFTING (CABG) (N/A) INTRAOPERATIVE TRANSESOPHAGEAL ECHOCARDIOGRAM (N/A) Doing well Will dc EPW Continue ambulation Restart metformin and actos Home in Am or possibly even later today   LOS: 4 days    Ivanka Kirshner C 08/18/2013

## 2013-08-18 NOTE — Progress Notes (Signed)
CARE MANAGEMENT NOTE 08/18/2013  Patient:  Joshua [name], Joshua [name]   Account Number:  [id number]  Date Initiated:  08/18/2013  Documentation initiated by:  [facility]  Subjective/Objective Assessment:   CORONARY ARTERY BYPASS GRAFTING (CABG     Action/Plan:   Anticipated DC Date:  08/20/2013   Anticipated DC Plan:  [place]  CM consult      Choice offered to / List presented to:             Status of service:  Completed, signed off Medicare Important Message given?  YES (If response is "NO", the following Medicare IM given date fields will be blank) Date Medicare IM given:  08/18/2013 Medicare IM given by:  [facility] Date Additional Medicare IM given:   Additional Medicare IM given by:    Discharge Disposition:  HOME/SELF CARE  Per UR Regulation:    If discussed at Long Length of Stay Meetings, dates discussed:    Comments:  08/18/2013 1715 NCM spoke to pt and gave permission to speak to wife. [name]. Pt states he has RW at home. Wife at home to assist with his care. He had [facility] in the past for [facility]. Waiting final recommendations for home. Pt scheduled for Cardiac Rehab. Jonnie Finner RN CCM Case Mgmt phone [phone]

## 2013-08-18 NOTE — Progress Notes (Signed)
Pacing wires pulled at 1330 pt tolerated well.

## 2013-08-19 LAB — GLUCOSE, CAPILLARY: GLUCOSE-CAPILLARY: 160 mg/dL — AB (ref 70–99)

## 2013-08-19 MED ORDER — ASPIRIN 325 MG PO TBEC: 325.0000 mg | DELAYED_RELEASE_TABLET | Freq: Every day | ORAL | Status: DC

## 2013-08-19 MED ORDER — OXYCODONE HCL 5 MG PO TABS: 5.0000 mg | ORAL_TABLET | ORAL | Status: DC | PRN

## 2013-08-19 NOTE — Progress Notes (Addendum)
[address]       [place] [address]             [phone]      5 Days Post-Op Procedure(s) (LRB): CORONARY ARTERY BYPASS GRAFTING (CABG) (N/A) INTRAOPERATIVE TRANSESOPHAGEAL ECHOCARDIOGRAM (N/A)  Subjective:  Joshua [name] has no complaints this morning.  He is ambulating without difficulty. + BM  States he is ready to go home.  Objective: Vital signs in last 24 hours: Temp:  [97.9 F (36.6 C)-98.4 F (36.9 C)] 98.4 F (36.9 C) (08/15 0549) Pulse Rate:  [77-82] 82 (08/15 0549) Cardiac Rhythm:  [-] Normal sinus rhythm (08/14 2000) Resp:  [18] 18 (08/15 0549) BP: (108-122)/(48-54) 122/54 mmHg (08/15 0549) SpO2:  [96 %-100 %] 96 % (08/15 0549) Weight:  [203 lb 7.8 oz (92.3 kg)] 203 lb 7.8 oz (92.3 kg) (08/15 0549)  Intake/Output from previous day: 08/14 0701 - 08/15 0700 In: 720 [P.O.:720] Out: 450 [Urine:450] Intake/Output this shift: Total I/O In: 240 [P.O.:240] Out: -   General appearance: alert, cooperative and no distress Heart: regular rate and rhythm Lungs: clear to auscultation bilaterally Abdomen: soft, non-tender; bowel sounds normal; no masses,  no organomegaly Extremities: edema trace Wound: clean and dry  Lab Results:  Recent Labs  08/17/13 0307  WBC 7.3  HGB 9.0*  HCT 28.0*  PLT 177   BMET:  Recent Labs  08/17/13 0307  NA 141  K 4.2  CL 102  CO2 29  GLUCOSE 73  BUN 23  CREATININE 0.76  CALCIUM 8.2*    PT/INR: No results found for this basename: LABPROT, INR,  in the last 72 hours ABG    Component Value Date/Time   PHART 7.408 08/15/2013 0420   HCO3 23.2 08/15/2013 0420   TCO2 21 08/15/2013 1642   ACIDBASEDEF 1.0 08/15/2013 0420   O2SAT 95.0 08/15/2013 0420   CBG (last 3)   Recent Labs  08/18/13 1631 08/18/13 2131 08/19/13 0618  GLUCAP 142* 212* 160*    Assessment/Plan: S/P Procedure(s) (LRB): CORONARY ARTERY BYPASS GRAFTING (CABG) (N/A) INTRAOPERATIVE TRANSESOPHAGEAL ECHOCARDIOGRAM (N/A)  1. CV-  NSR, pressure controlled- continue Lopressor, Altace 2. Pulm- no acute issues, encouraged use of IS at discharge 3. Renal- weight is at baseline, not on Lasix 4. DM- sugars have been erratic, have restarted home medication regimen 5. Dispo- patient doing well, will plan to d/c today    LOS: 5 days    BARRETT, ERIN 08/19/2013  Patient seen and examined, agree with above

## 2013-08-19 NOTE — Progress Notes (Signed)
Discharged to home with family office visits in place teaching done  

## 2013-08-21 ENCOUNTER — Ambulatory Visit: Payer: Medicare PPO | Admitting: Interventional Cardiology

## 2013-08-21 ENCOUNTER — Telehealth: Payer: Self-pay | Admitting: Family Medicine

## 2013-08-21 MED ORDER — INSULIN DETEMIR 100 UNIT/ML FLEXPEN: 30.0000 [IU] | PEN_INJECTOR | Freq: Every day | SUBCUTANEOUS | Status: DC

## 2013-08-21 NOTE — Telephone Encounter (Signed)
rx sent to [facility].

## 2013-08-22 LAB — TYPE AND SCREEN
ABO/RH(D): AB POS
Antibody Screen: NEGATIVE
UNIT DIVISION: 0
UNIT DIVISION: 0
UNIT DIVISION: 0
Unit division: 0
Unit division: 0
Unit division: 0

## 2013-09-04 ENCOUNTER — Other Ambulatory Visit: Payer: Self-pay | Admitting: Family Medicine

## 2013-09-04 MED ORDER — BUPROPION HCL ER (SR) 150 MG PO TB12: 150.0000 mg | ORAL_TABLET | Freq: Two times a day (BID) | ORAL | Status: DC

## 2013-09-06 ENCOUNTER — Encounter: Payer: Self-pay | Admitting: Interventional Cardiology

## 2013-09-06 ENCOUNTER — Ambulatory Visit (INDEPENDENT_AMBULATORY_CARE_PROVIDER_SITE_OTHER): Payer: Commercial Managed Care - HMO | Admitting: Interventional Cardiology

## 2013-09-06 VITALS — BP 112/66 | HR 80 | Ht 71.5 in | Wt 207.0 lb

## 2013-09-06 DIAGNOSIS — F17200 Nicotine dependence, unspecified, uncomplicated: Secondary | ICD-10-CM

## 2013-09-06 DIAGNOSIS — I2584 Coronary atherosclerosis due to calcified coronary lesion: Secondary | ICD-10-CM

## 2013-09-06 DIAGNOSIS — R609 Edema, unspecified: Secondary | ICD-10-CM

## 2013-09-06 DIAGNOSIS — E785 Hyperlipidemia, unspecified: Secondary | ICD-10-CM

## 2013-09-06 DIAGNOSIS — I2510 Atherosclerotic heart disease of native coronary artery without angina pectoris: Secondary | ICD-10-CM

## 2013-09-06 MED ORDER — FUROSEMIDE 40 MG PO TABS: ORAL_TABLET | ORAL | Status: DC

## 2013-09-06 NOTE — Progress Notes (Signed)
Patient ID: Joshua [name], male   DOB: 03/07/1940, 73 y.o.   MRN: [id number]    [place], [address] [place], [place]  [address] Phone: [phone] Fax:  [phone]  Date:  09/06/2013   ID:  Joshua, [name] 17-Jul-1940, MRN [id number]  PCP:  Tammi Sou, MD      History of Present Illness: Joshua [name] is a 73 y.o. male who has had pericarditis couple months ago. He underwent cardiac cath after this time and had three-vessel coronary artery disease. He underwent bypass surgery. He is recovering well he denies any chest discomfort. He feels that his energy level is improving. The sternal wound is healing well. He has had some mild lower extremity swelling. This is not too bothersome.   Wt Readings from Last 3 Encounters:  09/06/13 207 lb (93.895 kg)  08/19/13 203 lb 7.8 oz (92.3 kg)  08/19/13 203 lb 7.8 oz (92.3 kg)     Past Medical History  Diagnosis Date  . Hypertension   . Tobacco dependence     Prior failure to quit with wellbutrin.  100+ pack-yr history.  Quit for 1 mo to get hip replacement (1 pack/day)  . Obesity   . Hyperplastic colon polyp 2001  . Erectile dysfunction     Normal testosterone  . Adenomatous colon polyp 10/16/2011    Repeat 2018  . 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 pylori neg, no dysplasia or malignancy.  . Macular degeneration, dry     Mild, bilat (Optometrist, Mayford Knife at [employer] [facility] [place] in [place], [place])  . 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  . 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 COPD (chronic obstructive pulmonary disease)     Spirometry  2004 borderline obstruction  . Hyperlipidemia   . 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 Other and unspecified angina pectoris   . DOE (dyspnea on exertion)    . Pneumonia   . DM type 2 (diabetes mellitus, type 2)     Poor control on max oral meds--pt eventually agreed to insulin therapy  . CAD, multiple vessel     3V CAD cath 08/08/13--plan for CABG 08/14/13  . CKD (chronic kidney disease), stage I   . Diabetic nephropathy     Elevated urine microalb/cr 03/2011    Current Outpatient Prescriptions  Medication Sig Dispense Refill  . acetaminophen (TYLENOL) 325 MG tablet Take 2 tablets (650 mg total) by mouth every 4 (four) hours as needed for headache or mild pain.      .Marland Kitchenaspirin EC 325 MG EC tablet Take 1 tablet (325 mg total) by mouth daily.  30 tablet  0  . budesonide-formoterol (SYMBICORT) 160-4.5 MCG/ACT inhaler Inhale 2 puffs into the lungs 2 (two) times daily.  1 Inhaler  12  . buPROPion (WELLBUTRIN SR) 150 MG 12 hr tablet Take 1 tablet (150 mg total) by mouth 2 (two) times daily.  60 tablet  3  . Cholecalciferol (VITAMIN D-3) 1000 UNITS CAPS Take 1,000 Units by mouth daily.       . Chromium Picolinate 1000 MCG TABS Take 1,000 mcg by mouth daily.       . ferrous sulfate 325 (65 FE) MG tablet Take 325 mg by mouth daily with  breakfast.      . glimepiride (AMARYL) 4 MG tablet Take 1 tablet (4 mg total) by mouth 2 (two) times daily.  180 tablet  1  . Insulin Detemir (LEVEMIR FLEXPEN) 100 UNIT/ML Pen Inject 30-39 Units into the skin at bedtime.  15 mL  3  . lovastatin (MEVACOR) 40 MG tablet Take 1 tablet (40 mg total) by mouth 2 (two) times daily.  180 tablet  1  . metFORMIN (GLUCOPHAGE) 1000 MG tablet Take 1 tablet (1,000 mg total) by mouth 2 (two) times daily with a meal.  180 tablet  1  . metoprolol tartrate (LOPRESSOR) 25 MG tablet Take 0.5 tablets (12.5 mg total) by mouth 2 (two) times daily.  60 tablet  5  . Multiple Vitamin (MULTIVITAMIN WITH MINERALS) TABS Take 1 tablet by mouth daily.      . nicotine (NICODERM CQ - DOSED IN MG/24 HR) 7 mg/24hr patch Place 7 mg onto the skin every other day.      . pioglitazone (ACTOS) 45 MG tablet Take 1  tablet (45 mg total) by mouth daily.  90 tablet  1  . ramipril (ALTACE) 10 MG capsule Take 1 capsule (10 mg total) by mouth daily.  30 capsule  1  . tamsulosin (FLOMAX) 0.4 MG CAPS capsule Take 1 capsule (0.4 mg total) by mouth daily after supper.  30 capsule  2  . oxyCODONE (OXY IR/ROXICODONE) 5 MG immediate release tablet Take 1-2 tablets (5-10 mg total) by mouth every 3 (three) hours as needed for moderate pain.  30 tablet  0   No current facility-administered medications for this visit.    Allergies:    Allergies  Allergen Reactions  . Morphine And Related Other (See Comments)    Drenched with perspiration  . Demerol [Meperidine] Nausea Only  . Starlix [Nateglinide] Other (See Comments)    gassy    Social History:  The patient  reports that he quit smoking about 2 months ago. His smoking use included Cigarettes. He has a 62 pack-year smoking history. He quit smokeless tobacco use about 2 months ago. He reports that he does not drink alcohol or use illicit drugs.   Family History:  The patient's family history includes Breast cancer in his sister; CVA in his mother; Diabetes in his maternal uncle and paternal grandmother; Heart disease in his father and maternal uncle.   ROS:  Please see the history of present illness.  No nausea, vomiting.  No fevers, chills.  No focal weakness.  No dysuria. Energy level improving   All other systems reviewed and negative.   PHYSICAL EXAM: VS:  BP 112/66  Pulse 80  Ht 5' 11.5" (1.816 m)  Wt 207 lb (93.895 kg)  BMI 28.47 kg/m2 Well nourished, well developed, in no acute distress HEENT: normal Neck: no JVD, no carotid bruits Cardiac:  normal S1, S2; RRR; well healing sternal wound Lungs:  clear to auscultation bilaterally, no wheezing, rhonchi or rales Abd: soft, nontender, no hepatomegaly Ext: Mild bilateral lower extremity edema Skin: warm and dry Neuro:   no focal abnormalities noted  EKG:       ASSESSMENT AND PLAN:  1. CAD: s/p CABG-  doing well.   2. Tobacco abuse: he has stopped smoking. Continue to abstain. OK to use nicotine patches. 3. Continue mevacor or lipid lowering therapy. LDL 63 in 6/15. 4. Edema: Lasix 40 mg by mouth daily when necessary lower extremity swelling.  Signed, Mina Marble, MD, [facility] 09/06/2013 9:05 AM

## 2013-09-06 NOTE — Patient Instructions (Signed)
Your physician has recommended you make the following change in your medication:   1. Start Lasix 40 mg 1 tablet daily as needed for shortness of breath and/or swelling.   Your physician recommends that you schedule a follow-up appointment in: 4 months with Dr. Irish Lack.

## 2013-09-07 ENCOUNTER — Telehealth (HOSPITAL_COMMUNITY): Payer: Self-pay | Admitting: *Deleted

## 2013-09-07 NOTE — Telephone Encounter (Signed)
Received signed MD order.  Called and spoke to pt's wife.  Scheduled to see the surgeon- Dr. Roxan Hockey on 9/15.  Requested call back after appt completed.  Pt's wife reminded to contact insurance provider for benefits toward cardiac rehab.   Verbalized understanding.

## 2013-09-08 ENCOUNTER — Encounter: Payer: Self-pay | Admitting: Family Medicine

## 2013-09-08 ENCOUNTER — Encounter: Payer: Commercial Managed Care - HMO | Admitting: Physician Assistant

## 2013-09-12 ENCOUNTER — Ambulatory Visit: Payer: Commercial Managed Care - HMO | Admitting: Thoracic Surgery (Cardiothoracic Vascular Surgery)

## 2013-09-15 ENCOUNTER — Other Ambulatory Visit: Payer: Self-pay | Admitting: Thoracic Surgery (Cardiothoracic Vascular Surgery)

## 2013-09-15 DIAGNOSIS — I319 Disease of pericardium, unspecified: Secondary | ICD-10-CM

## 2013-09-19 ENCOUNTER — Ambulatory Visit (INDEPENDENT_AMBULATORY_CARE_PROVIDER_SITE_OTHER): Payer: Self-pay | Admitting: Thoracic Surgery (Cardiothoracic Vascular Surgery)

## 2013-09-19 ENCOUNTER — Ambulatory Visit
Admission: RE | Admit: 2013-09-19 | Discharge: 2013-09-19 | Disposition: A | Discharge status: Home / Self Care | Payer: Commercial Managed Care - HMO | Source: Ambulatory Visit | Attending: Thoracic Surgery (Cardiothoracic Vascular Surgery) | Admitting: Thoracic Surgery (Cardiothoracic Vascular Surgery)

## 2013-09-19 ENCOUNTER — Encounter: Payer: Self-pay | Admitting: Thoracic Surgery (Cardiothoracic Vascular Surgery)

## 2013-09-19 VITALS — BP 115/66 | HR 100 | Resp 20 | Ht 71.0 in | Wt 207.0 lb

## 2013-09-19 DIAGNOSIS — I2510 Atherosclerotic heart disease of native coronary artery without angina pectoris: Secondary | ICD-10-CM

## 2013-09-19 DIAGNOSIS — I319 Disease of pericardium, unspecified: Secondary | ICD-10-CM

## 2013-09-19 DIAGNOSIS — Z951 Presence of aortocoronary bypass graft: Secondary | ICD-10-CM

## 2013-09-19 MED ORDER — FUROSEMIDE 40 MG PO TABS: 40.0000 mg | ORAL_TABLET | Freq: Every day | ORAL | Status: DC

## 2013-09-19 MED ORDER — POTASSIUM CHLORIDE CRYS ER 20 MEQ PO TBCR: 20.0000 meq | EXTENDED_RELEASE_TABLET | Freq: Every day | ORAL | Status: DC

## 2013-09-19 NOTE — Progress Notes (Signed)
HPI:  [name] returns for a scheduled postoperative followup visit.  He is a 73 year old gentleman who had an episode of pericarditis earlier in the summer. In early August he had cardiac catheterizations revealed severe three-vessel coronary disease. On 08/14/2013 we did coronary bypass grafting x4. 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 His target vessels were only fair. He did have good conduits.  He says that overall he feels well. His exercise tolerance is improving. He does get short of breath with exertion. He said that this morning he walked from one end of [employer] to the other and felt "a little winded." It took him or 2 to get his breath back. He has not had any anginal-type pain. He has not had to take anything stronger than Tylenol for incisional pain. He has noted swelling in his legs.  Past Medical History  Diagnosis Date  . Hypertension   . Tobacco dependence     Prior failure to quit with wellbutrin.  100+ pack-yr history.  Quit for 1 mo to get hip replacement (1 pack/day)  . Obesity   . Hyperplastic colon polyp 2001  . Erectile dysfunction     Normal testosterone  . Adenomatous colon polyp 10/16/2011    Repeat 2018  . 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 pylori neg, no dysplasia or malignancy.  . Macular degeneration, dry     Mild, bilat (Optometrist, Mayford Knife at [employer] [facility] [place] in [place], [place])  . Iron deficiency anemia 2014    03/2012 capsule endoscopy showed 2 AVMs--likely responsible for his IDA--lifetime iron supp recommended + q47moCBCs.  . 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 COPD (chronic obstructive pulmonary disease)     Spirometry  2004 borderline obstruction  . Hyperlipidemia   . 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 Other and unspecified angina  pectoris   . DOE (dyspnea on exertion)   . Pneumonia   . DM type 2 (diabetes mellitus, type 2)     Poor control on max oral meds--pt eventually agreed to insulin therapy  . CAD, multiple vessel     3V CAD cath 08/08/13----CABG done shortly after.  . CKD (chronic kidney disease), stage I   . Diabetic nephropathy     Elevated urine microalb/cr 03/2011      Current Outpatient Prescriptions  Medication Sig Dispense Refill  . acetaminophen (TYLENOL) 325 MG tablet Take 2 tablets (650 mg total) by mouth every 4 (four) hours as needed for headache or mild pain.      . budesonide-formoterol (SYMBICORT) 160-4.5 MCG/ACT inhaler Inhale 2 puffs into the lungs 2 (two) times daily.  1 Inhaler  12  . buPROPion (WELLBUTRIN SR) 150 MG 12 hr tablet Take 1 tablet (150 mg total) by mouth 2 (two) times daily.  60 tablet  3  . Cholecalciferol (VITAMIN D-3) 1000 UNITS CAPS Take 1,000 Units by mouth daily.       . Chromium Picolinate 1000 MCG TABS Take 1,000 mcg by mouth daily.       . ferrous sulfate 325 (65 FE) MG tablet Take 325 mg by mouth daily with breakfast.      . glimepiride (AMARYL) 4 MG tablet Take 1 tablet (4 mg total) by mouth 2 (two) times daily.  180 tablet  1  . Insulin Detemir (LEVEMIR FLEXPEN)  100 UNIT/ML Pen Inject 30-39 Units into the skin at bedtime.  15 mL  3  . lovastatin (MEVACOR) 40 MG tablet Take 1 tablet (40 mg total) by mouth 2 (two) times daily.  180 tablet  1  . metFORMIN (GLUCOPHAGE) 1000 MG tablet Take 1 tablet (1,000 mg total) by mouth 2 (two) times daily with a meal.  180 tablet  1  . metoprolol tartrate (LOPRESSOR) 25 MG tablet Take 0.5 tablets (12.5 mg total) by mouth 2 (two) times daily.  60 tablet  5  . Multiple Vitamin (MULTIVITAMIN WITH MINERALS) TABS Take 1 tablet by mouth daily.      . nicotine (NICODERM CQ - DOSED IN MG/24 HR) 7 mg/24hr patch Place 7 mg onto the skin every other day.      . pioglitazone (ACTOS) 45 MG tablet Take 1 tablet (45 mg total) by mouth daily.  90  tablet  1  . ramipril (ALTACE) 10 MG capsule Take 1 capsule (10 mg total) by mouth daily.  30 capsule  1  . tamsulosin (FLOMAX) 0.4 MG CAPS capsule Take 1 capsule (0.4 mg total) by mouth daily after supper.  30 capsule  2  . furosemide (LASIX) 40 MG tablet Take 1 tablet (40 mg total) by mouth daily.  30 tablet  1  . potassium chloride SA (K-DUR,KLOR-CON) 20 MEQ tablet Take 1 tablet (20 mEq total) by mouth daily.  30 tablet  1   No current facility-administered medications for this visit.    Physical Exam BP 115/66  Pulse 100  Resp 20  Ht 5' 11" (1.803 m)  Wt 207 lb (93.895 kg)  BMI 28.88 kg/m2  SpO65 2% 73 year old gentleman in no acute distress Alert and oriented x3 with no focal deficits Sternal incision clean dry and intact, sternum stable Cardiac regular rate and rhythm normal S1 and S2, no rub Lungs clear with equal breath sounds bilaterally 2+ edema both lower extremities Leg incisions healing well  Diagnostic Tests: CHEST 2 VIEW  COMPARISON: 08/17/2013  FINDINGS:  Bilateral patchy interstitial and alveolar airspace opacities. Trace  bilateral pleural effusions. No pneumothorax. Normal  cardiomediastinal silhouette. Prior CABG. Unremarkable osseous  structures.  IMPRESSION:  Overall findings most concerning for pulmonary edema.  Electronically Signed  By: Kathreen Devoid  On: 09/19/2013 10:07         Impression: 73 year old gentleman who is now a little over a month out from coronary bypass grafting x4. This was in the setting of a recent episode of acute pericarditis. Overall he is doing well at this point. His exercise tolerance is good and continues to improve. He's having minimal discomfort.  The one area that is concerning is his volume status. His chest x-ray shows mild pulmonary edema and he has significant bilateral lower extremity edema. He says that he was told he was done be treated with Lasix but never got a prescription for it. I think he needs to be on  Lasix and prescribed 40 mg by mouth daily. I also gave him a prescription for potassium chloride 20 mEq by mouth daily. This prescription for 30 tablets with one refill.  He may begin driving, appropriate precautions were discussed. He is not to lift anything over 10 pounds for another 2 weeks after that can gradually increase his activities.  Plan: He will continue to be followed by Dr. Casandra Doffing for his cardiac issues. I will be happy to see him back any time if I can be of any further assistance with  his care.

## 2013-09-28 ENCOUNTER — Encounter (HOSPITAL_COMMUNITY)
Admission: RE | Admit: 2013-09-28 | Discharge: 2013-09-28 | Disposition: A | Discharge status: Home / Self Care | Payer: Commercial Managed Care - HMO | Source: Ambulatory Visit | Attending: Interventional Cardiology | Admitting: Interventional Cardiology

## 2013-09-28 DIAGNOSIS — I2510 Atherosclerotic heart disease of native coronary artery without angina pectoris: Secondary | ICD-10-CM | POA: Insufficient documentation

## 2013-09-28 DIAGNOSIS — Z951 Presence of aortocoronary bypass graft: Secondary | ICD-10-CM | POA: Insufficient documentation

## 2013-09-28 NOTE — Progress Notes (Signed)
Cardiac Rehab Medication Review by a Pharmacist  Does the patient  feel that his/her medications are working for him/her?  yes  Has the patient been experiencing any side effects to the medications prescribed?  Yes - episode of low sugar this morning of 73 (drank orange juice and ate a milky way).   Does the patient measure his/her own blood pressure or blood glucose at home?  yes   Does the patient have any problems obtaining medications due to transportation or finances?   no  Understanding of regimen: good Understanding of indications: good Potential of compliance: good  Pharmacist comments: Patient understands his medications and what they are for.  He is experiencing some low blood glucose readings in the morning and high at night.  I instructed him to follow-up with his doctor if he has any really low readings and they can adjust his medications as needed.  He currently has no barriers to obtaining his medications, but did express concern about paying for them if he stops working.  He has good compliance other than forgetting a few times to take his morning medications (he takes them at noon instead when this happens).   Ritvik Mczeal L. Nicole Kindred, PharmD Clinical Pharmacy Resident Pager: [phone] 09/28/2013 9:14 AM

## 2013-10-02 ENCOUNTER — Encounter (HOSPITAL_COMMUNITY)
Admission: RE | Admit: 2013-10-02 | Discharge: 2013-10-02 | Disposition: A | Discharge status: Home / Self Care | Payer: Commercial Managed Care - HMO | Source: Ambulatory Visit | Attending: Interventional Cardiology | Admitting: Interventional Cardiology

## 2013-10-02 DIAGNOSIS — I2510 Atherosclerotic heart disease of native coronary artery without angina pectoris: Secondary | ICD-10-CM | POA: Diagnosis present

## 2013-10-02 DIAGNOSIS — Z951 Presence of aortocoronary bypass graft: Secondary | ICD-10-CM | POA: Diagnosis not present

## 2013-10-02 LAB — GLUCOSE, CAPILLARY
Glucose-Capillary: 140 mg/dL — ABNORMAL HIGH (ref 70–99)
Glucose-Capillary: 124 mg/dL — ABNORMAL HIGH (ref 70–99)

## 2013-10-02 NOTE — Addendum Note (Signed)
Addendum created 10/02/13 1844 by Lillia Abed, MD   Modules edited: Anesthesia LDA

## 2013-10-03 ENCOUNTER — Encounter (HOSPITAL_COMMUNITY): Payer: Self-pay

## 2013-10-03 NOTE — Progress Notes (Signed)
(  late entry for 10/02/13) Pt started cardiac rehab today.  Pt tolerated light exercise without difficulty.  VSS, telemetry-sinus rhythm, intermittent bundle branch block, occasional PAC and PVC.  Strips faxed to MD for review. Pt admits he does not currently take his medications daily as prescribed, specifically his symbicort.  Pt was also instructed to bring his lovastatin bottle with him to next rehab appt to confirm dosage.   Pt admits that he smokes 1ppd which is down from his previous tobacco use.  Pt is currently taking welbutrin.  Pt advised to contact 1800quitnow.  Pt states he has called in the past without benefit for him.  Pt states he is not currently using nicotine patches, however he would like to start back at 38m instead of 764m  Pt instructed to be abstinent from tobacco use while he is wearing nicotine patches.  Understanding verbalized.  PHQ-0.  Pt exhibits positive outlook, good coping skills and supportive family.  Pt enjoys watching TV, westerns and action shows.  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

## 2013-10-04 ENCOUNTER — Ambulatory Visit: Payer: Commercial Managed Care - HMO | Admitting: Family Medicine

## 2013-10-04 ENCOUNTER — Encounter (HOSPITAL_COMMUNITY): Payer: Commercial Managed Care - HMO

## 2013-10-05 ENCOUNTER — Other Ambulatory Visit: Payer: Self-pay | Admitting: Family Medicine

## 2013-10-06 ENCOUNTER — Telehealth (HOSPITAL_COMMUNITY): Payer: Self-pay | Admitting: Cardiac Rehabilitation

## 2013-10-06 ENCOUNTER — Encounter (HOSPITAL_COMMUNITY): Payer: Commercial Managed Care - HMO

## 2013-10-06 NOTE — Telephone Encounter (Signed)
pc received from pt stating he would like to drop cardiac rehab program.  Pt feels he is able to exercise on his own walking at home.  Cardiologist made aware

## 2013-10-09 ENCOUNTER — Encounter (HOSPITAL_COMMUNITY): Payer: Commercial Managed Care - HMO

## 2013-10-11 ENCOUNTER — Encounter (HOSPITAL_COMMUNITY): Payer: Commercial Managed Care - HMO

## 2013-10-12 ENCOUNTER — Encounter: Payer: Self-pay | Admitting: Family Medicine

## 2013-10-12 ENCOUNTER — Ambulatory Visit (INDEPENDENT_AMBULATORY_CARE_PROVIDER_SITE_OTHER): Payer: Commercial Managed Care - HMO | Admitting: Family Medicine

## 2013-10-12 VITALS — BP 93/66 | HR 69 | Temp 97.4°F | Resp 18 | Ht 71.0 in | Wt 203.0 lb

## 2013-10-12 DIAGNOSIS — D62 Acute posthemorrhagic anemia: Secondary | ICD-10-CM

## 2013-10-12 DIAGNOSIS — Z951 Presence of aortocoronary bypass graft: Secondary | ICD-10-CM

## 2013-10-12 DIAGNOSIS — E1069 Type 1 diabetes mellitus with other specified complication: Secondary | ICD-10-CM

## 2013-10-12 DIAGNOSIS — I10 Essential (primary) hypertension: Secondary | ICD-10-CM

## 2013-10-12 DIAGNOSIS — Z23 Encounter for immunization: Secondary | ICD-10-CM

## 2013-10-12 DIAGNOSIS — E108 Type 1 diabetes mellitus with unspecified complications: Principal | ICD-10-CM

## 2013-10-12 DIAGNOSIS — IMO0002 Reserved for concepts with insufficient information to code with codable children: Secondary | ICD-10-CM

## 2013-10-12 DIAGNOSIS — E1065 Type 1 diabetes mellitus with hyperglycemia: Secondary | ICD-10-CM

## 2013-10-12 LAB — COMPREHENSIVE METABOLIC PANEL
ALT: 12 U/L (ref 0–53)
AST: 20 U/L (ref 0–37)
Albumin: 3.6 g/dL (ref 3.5–5.2)
Alkaline Phosphatase: 89 U/L (ref 39–117)
BILIRUBIN TOTAL: 0.6 mg/dL (ref 0.2–1.2)
BUN: 41 mg/dL — ABNORMAL HIGH (ref 6–23)
CHLORIDE: 97 meq/L (ref 96–112)
CO2: 32 mEq/L (ref 19–32)
CREATININE: 1.2 mg/dL (ref 0.4–1.5)
Calcium: 9.6 mg/dL (ref 8.4–10.5)
GFR: 64.68 mL/min (ref >60.00)
Glucose, Bld: 62 mg/dL — ABNORMAL LOW (ref 70–99)
Potassium: 4.6 mEq/L (ref 3.5–5.1)
Sodium: 139 mEq/L (ref 135–145)
Total Protein: 7.1 g/dL (ref 6.0–8.3)

## 2013-10-12 LAB — CBC WITH DIFFERENTIAL/PLATELET
BASOS PCT: 0.6 % (ref 0.0–3.0)
Basophils Absolute: 0.1 10*3/uL (ref 0.0–0.1)
EOS ABS: 0.2 10*3/uL (ref 0.0–0.7)
Eosinophils Relative: 2.2 % (ref 0.0–5.0)
HEMATOCRIT: 43.6 % (ref 39.0–52.0)
Hemoglobin: 13.8 g/dL (ref 13.0–17.0)
LYMPHS ABS: 2.7 10*3/uL (ref 0.7–4.0)
Lymphocytes Relative: 29.3 % (ref 12.0–46.0)
MCHC: 31.6 g/dL (ref 30.0–36.0)
MCV: 83.9 fl (ref 78.0–100.0)
Monocytes Absolute: 0.7 10*3/uL (ref 0.1–1.0)
Monocytes Relative: 7.5 % (ref 3.0–12.0)
NEUTROS ABS: 5.7 10*3/uL (ref 1.4–7.7)
Neutrophils Relative %: 60.4 % (ref 43.0–77.0)
Platelets: 225 10*3/uL (ref 150.0–400.0)
RBC: 5.19 Mil/uL (ref 4.22–5.81)
RDW: 16.9 % — AB (ref 11.5–15.5)
WBC: 9.4 10*3/uL (ref 4.0–10.5)

## 2013-10-12 LAB — MICROALBUMIN / CREATININE URINE RATIO
CREATININE, U: 14.9 mg/dL
MICROALB/CREAT RATIO: 3.4 mg/g (ref 0.0–30.0)
Microalb, Ur: 0.5 mg/dL (ref 0.0–1.9)

## 2013-10-12 MED ORDER — GLIMEPIRIDE 4 MG PO TABS: 4.0000 mg | ORAL_TABLET | Freq: Every day | ORAL | Status: DC

## 2013-10-12 MED ORDER — FUROSEMIDE 40 MG PO TABS: 20.0000 mg | ORAL_TABLET | Freq: Every day | ORAL | Status: DC

## 2013-10-12 NOTE — Assessment & Plan Note (Signed)
Poor control, with occ middle of the night hypoglycemia. Take glimeperide 6m in morning only (no evening dose) Increase your levemir by 1 unit every night until your fasting glucose is in the range of 100-110.  Continue actos and metformin. Next A1c in 260moUrine microalb/cr today. Feet exam next f/u visit. Prevnar and flu vaccines IM today.

## 2013-10-12 NOTE — Patient Instructions (Signed)
Take glimeperide 42m in morning only (no evening dose) Increase your levemir by 1 unit every night until your fasting glucose is in the range of 100-110. Decrease your lasix to 1/2 of 435mtab once a day.

## 2013-10-12 NOTE — Assessment & Plan Note (Signed)
Could be adding to his fatigue/"washed out" feeling that he gets still. Stay on iron tab qd. Recheck CBC today.

## 2013-10-12 NOTE — Progress Notes (Signed)
OFFICE NOTE  10/12/2013  CC:  Chief Complaint  Patient presents with  . Follow-up    fasting   HPI: Patient is a 73 y.o. Caucasian male who is here for 4 mo f/u CAD, DM 2, HTN, Hyperlipidemia.   Gluc's in 200s, having some middle of night lows in 70s, symptomatic. Taking glimeperide 88m bid.  Most recent dose of levemir is 26 U. No bp monitoring at home.  Wife says since starting lasix at a post-op CV surg visit he has acted more tired/washed out easily.  He says he feels better regarding breathing and LE swelling since taking lasix daily since 09/19/13.  Mowed his yard with riding lConservation officer, natureyesterday.  Fasting today.   Pertinent PMH:  Past medical, surgical, social, and family history reviewed and no changes are noted since last office visit.  MEDS:  Outpatient Prescriptions Prior to Visit  Medication Sig Dispense Refill  . budesonide-formoterol (SYMBICORT) 160-4.5 MCG/ACT inhaler Inhale 2 puffs into the lungs 2 (two) times daily.  1 Inhaler  12  . buPROPion (WELLBUTRIN SR) 150 MG 12 hr tablet Take 150 mg by mouth 2 (two) times daily. Pt admits to frequent missed doses      . chlorthalidone (HYGROTON) 25 MG tablet TAKE ONE TABLET BY MOUTH EVERY DAY  90 tablet  1  . Cholecalciferol (VITAMIN D-3) 1000 UNITS CAPS Take 1,000 Units by mouth daily.       . Chromium Picolinate 1000 MCG TABS Take 1,000 mcg by mouth daily.       . ferrous sulfate 325 (65 FE) MG tablet Take 325 mg by mouth daily with breakfast.      . Insulin Detemir (LEVEMIR FLEXPEN) 100 UNIT/ML Pen Inject 30-39 Units into the skin at bedtime.  15 mL  3  . lovastatin (MEVACOR) 40 MG tablet Take 1 tablet (40 mg total) by mouth 2 (two) times daily.  180 tablet  1  . metFORMIN (GLUCOPHAGE) 1000 MG tablet Take 1 tablet (1,000 mg total) by mouth 2 (two) times daily with a meal.  180 tablet  1  . metoprolol tartrate (LOPRESSOR) 25 MG tablet Take 0.5 tablets (12.5 mg total) by mouth 2 (two) times daily.  60 tablet  5  . Multiple  Vitamin (MULTIVITAMIN WITH MINERALS) TABS Take 1 tablet by mouth daily.      . pioglitazone (ACTOS) 45 MG tablet TAKE ONE TABLET BY MOUTH EVERY DAY  90 tablet  1  . potassium chloride SA (K-DUR,KLOR-CON) 20 MEQ tablet Take 1 tablet (20 mEq total) by mouth daily.  30 tablet  1  . ramipril (ALTACE) 10 MG capsule Take 1 capsule (10 mg total) by mouth daily.  30 capsule  1  . tamsulosin (FLOMAX) 0.4 MG CAPS capsule Take 1 capsule (0.4 mg total) by mouth daily after supper.  30 capsule  2  . furosemide (LASIX) 40 MG tablet Take 1 tablet (40 mg total) by mouth daily.  30 tablet  1  . glimepiride (AMARYL) 4 MG tablet Take 1 tablet (4 mg total) by mouth 2 (two) times daily.  180 tablet  1  . acetaminophen (TYLENOL) 325 MG tablet Take 2 tablets (650 mg total) by mouth every 4 (four) hours as needed for headache or mild pain.      . nicotine (NICODERM CQ - DOSED IN MG/24 HR) 7 mg/24hr patch Place 7 mg onto the skin every other day.       No facility-administered medications prior to visit.  PE: Blood pressure 93/66, pulse 69, temperature 97.4 F (36.3 C), temperature source Temporal, resp. rate 18, height _0  (1.803 m), weight 203 lb (92.08 kg), SpO2 98.00%. Gen: Alert, well appearing.  Patient is oriented to person, place, time, and situation. AXK:PVVZ: no injection, icteris, swelling, or exudate.  EOMI, PERRLA. Mouth: lips without lesion/swelling.  Oral mucosa pink and moist. Oropharynx without erythema, exudate, or swelling.  CV: RRR, no m/r/g.   LUNGS: CTA bilat, nonlabored resps, good aeration in all lung fields. EXT: 1+ pitting edema in both LL's, without rash.  IMPRESSION AND PLAN:  Type I diabetes mellitus with complication, uncontrolled Poor control, with occ middle of the night hypoglycemia. Take glimeperide 62m in morning only (no evening dose) Increase your levemir by 1 unit every night until your fasting glucose is in the range of 100-110.  Continue actos and metformin. Next A1c  in 246moUrine microalb/cr today. Feet exam next f/u visit. Prevnar and flu vaccines IM today.   Postoperative anemia due to acute blood loss Could be adding to his fatigue/"washed out" feeling that he gets still. Stay on iron tab qd. Recheck CBC today.  HTN (hypertension), benign BP low-normal here today. With c/o intermittent "washed out" feeling lately, esp since getting on the lasix (per wife's report), and exam showing good fluid balance today, I'll cut back on his lasix to 1/2 of 4027mab qd. If further bp med decrease needed will cut back or d/c hygroton. Continue altace and lopressor and ASA.   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 Continue ASA, BB, ACE-I, diuretic, gradually advance activity.   Keep cardiology f/u.   An After Visit Summary was printed and given to the patient.   FOLLOW UP: 2 mo

## 2013-10-12 NOTE — Assessment & Plan Note (Signed)
BP low-normal here today. With c/o intermittent "washed out" feeling lately, esp since getting on the lasix (per wife's report), and exam showing good fluid balance today, I'll cut back on his lasix to 1/2 of 56m tab qd. If further bp med decrease needed will cut back or d/c hygroton. Continue altace and lopressor and ASA.

## 2013-10-12 NOTE — Assessment & Plan Note (Signed)
He is about 2 mo s/p CABG and doing well. Continue ASA, BB, ACE-I, diuretic, gradually advance activity.   Keep cardiology f/u.

## 2013-10-12 NOTE — Progress Notes (Signed)
Pre visit review using our clinic review tool, if applicable. No additional management support is needed unless otherwise documented below in the visit note.

## 2013-10-13 ENCOUNTER — Telehealth: Payer: Self-pay | Admitting: Family Medicine

## 2013-10-13 ENCOUNTER — Ambulatory Visit: Payer: Medicare HMO | Admitting: Family Medicine

## 2013-10-13 ENCOUNTER — Encounter (HOSPITAL_COMMUNITY): Payer: Commercial Managed Care - HMO

## 2013-10-13 NOTE — Telephone Encounter (Signed)
emmi emailed °

## 2013-10-16 ENCOUNTER — Other Ambulatory Visit: Payer: Self-pay | Admitting: Family Medicine

## 2013-10-16 ENCOUNTER — Encounter (HOSPITAL_COMMUNITY): Payer: Commercial Managed Care - HMO

## 2013-10-18 ENCOUNTER — Encounter (HOSPITAL_COMMUNITY): Payer: Commercial Managed Care - HMO

## 2013-10-20 ENCOUNTER — Encounter (HOSPITAL_COMMUNITY): Payer: Commercial Managed Care - HMO

## 2013-10-23 ENCOUNTER — Encounter (HOSPITAL_COMMUNITY): Payer: Commercial Managed Care - HMO

## 2013-10-23 ENCOUNTER — Other Ambulatory Visit (INDEPENDENT_AMBULATORY_CARE_PROVIDER_SITE_OTHER): Payer: Commercial Managed Care - HMO

## 2013-10-23 ENCOUNTER — Telehealth: Payer: Self-pay

## 2013-10-23 DIAGNOSIS — R7989 Other specified abnormal findings of blood chemistry: Secondary | ICD-10-CM

## 2013-10-23 DIAGNOSIS — R748 Abnormal levels of other serum enzymes: Secondary | ICD-10-CM

## 2013-10-23 LAB — BASIC METABOLIC PANEL
BUN: 25 mg/dL — ABNORMAL HIGH (ref 6–23)
CALCIUM: 9.3 mg/dL (ref 8.4–10.5)
CHLORIDE: 98 meq/L (ref 96–112)
CO2: 30 mEq/L (ref 19–32)
Creatinine, Ser: 0.7 mg/dL (ref 0.4–1.5)
GFR: 115.11 mL/min (ref >60.00)
Glucose, Bld: 145 mg/dL — ABNORMAL HIGH (ref 70–99)
Potassium: 4.4 mEq/L (ref 3.5–5.1)
Sodium: 138 mEq/L (ref 135–145)

## 2013-10-23 NOTE — Telephone Encounter (Signed)
Pt would like to add a HGBA1C to his labs. Please advise with Dx code.

## 2013-10-25 ENCOUNTER — Encounter (HOSPITAL_COMMUNITY): Payer: Commercial Managed Care - HMO

## 2013-10-26 NOTE — Telephone Encounter (Signed)
HbA1c can only be checked once every 3 months MINIMUM.  His last check was less than 3 mo ago, that's why I didn't order it with most recent lab work.  Reassure him that we'll recheck it when appropriate.-thx

## 2013-10-27 ENCOUNTER — Encounter (HOSPITAL_COMMUNITY): Payer: Commercial Managed Care - HMO

## 2013-10-30 ENCOUNTER — Encounter (HOSPITAL_COMMUNITY): Payer: Commercial Managed Care - HMO

## 2013-11-01 ENCOUNTER — Encounter (HOSPITAL_COMMUNITY): Payer: Commercial Managed Care - HMO

## 2013-11-03 ENCOUNTER — Encounter (HOSPITAL_COMMUNITY): Payer: Commercial Managed Care - HMO

## 2013-11-06 ENCOUNTER — Encounter (HOSPITAL_COMMUNITY): Payer: Commercial Managed Care - HMO

## 2013-11-08 ENCOUNTER — Encounter (HOSPITAL_COMMUNITY): Payer: Commercial Managed Care - HMO

## 2013-11-10 ENCOUNTER — Encounter (HOSPITAL_COMMUNITY): Payer: Commercial Managed Care - HMO

## 2013-11-13 ENCOUNTER — Encounter (HOSPITAL_COMMUNITY): Payer: Commercial Managed Care - HMO

## 2013-11-15 ENCOUNTER — Encounter (HOSPITAL_COMMUNITY): Payer: Commercial Managed Care - HMO

## 2013-11-15 ENCOUNTER — Encounter: Payer: Self-pay | Admitting: Interventional Cardiology

## 2013-11-16 ENCOUNTER — Other Ambulatory Visit: Payer: Self-pay | Admitting: Family Medicine

## 2013-11-17 ENCOUNTER — Encounter (HOSPITAL_COMMUNITY): Payer: Commercial Managed Care - HMO

## 2013-11-20 ENCOUNTER — Other Ambulatory Visit: Payer: Self-pay | Admitting: Physician Assistant

## 2013-11-20 ENCOUNTER — Encounter (HOSPITAL_COMMUNITY): Payer: Commercial Managed Care - HMO

## 2013-11-22 ENCOUNTER — Encounter (HOSPITAL_COMMUNITY): Payer: Commercial Managed Care - HMO

## 2013-11-24 ENCOUNTER — Encounter (HOSPITAL_COMMUNITY): Payer: Commercial Managed Care - HMO

## 2013-11-27 ENCOUNTER — Encounter (HOSPITAL_COMMUNITY): Payer: Commercial Managed Care - HMO

## 2013-11-29 ENCOUNTER — Encounter (HOSPITAL_COMMUNITY): Payer: Commercial Managed Care - HMO

## 2013-12-04 ENCOUNTER — Encounter (HOSPITAL_COMMUNITY): Payer: Commercial Managed Care - HMO

## 2013-12-06 ENCOUNTER — Encounter (HOSPITAL_COMMUNITY): Payer: Commercial Managed Care - HMO

## 2013-12-08 ENCOUNTER — Encounter (HOSPITAL_COMMUNITY): Payer: Commercial Managed Care - HMO

## 2013-12-11 ENCOUNTER — Encounter (HOSPITAL_COMMUNITY): Payer: Commercial Managed Care - HMO

## 2013-12-12 ENCOUNTER — Encounter: Payer: Self-pay | Admitting: Family Medicine

## 2013-12-12 ENCOUNTER — Ambulatory Visit (INDEPENDENT_AMBULATORY_CARE_PROVIDER_SITE_OTHER): Payer: Commercial Managed Care - HMO | Admitting: Family Medicine

## 2013-12-12 VITALS — BP 137/74 | HR 69 | Temp 97.6°F | Resp 18 | Ht 71.0 in | Wt 219.0 lb

## 2013-12-12 DIAGNOSIS — E119 Type 2 diabetes mellitus without complications: Secondary | ICD-10-CM

## 2013-12-12 DIAGNOSIS — I10 Essential (primary) hypertension: Secondary | ICD-10-CM

## 2013-12-12 LAB — HEMOGLOBIN A1C: Hgb A1c MFr Bld: 10.6 % — ABNORMAL HIGH (ref 4.6–6.5)

## 2013-12-12 LAB — BASIC METABOLIC PANEL
BUN: 29 mg/dL — AB (ref 6–23)
CHLORIDE: 99 meq/L (ref 96–112)
CO2: 29 mEq/L (ref 19–32)
Calcium: 9.6 mg/dL (ref 8.4–10.5)
Creatinine, Ser: 1 mg/dL (ref 0.4–1.5)
GFR: 79.33 mL/min (ref >60.00)
Glucose, Bld: 93 mg/dL (ref 70–99)
POTASSIUM: 4.9 meq/L (ref 3.5–5.1)
SODIUM: 137 meq/L (ref 135–145)

## 2013-12-12 MED ORDER — PIOGLITAZONE HCL 45 MG PO TABS: 45.0000 mg | ORAL_TABLET | Freq: Every day | ORAL | Status: DC

## 2013-12-12 MED ORDER — GLIMEPIRIDE 4 MG PO TABS: 8.0000 mg | ORAL_TABLET | Freq: Every day | ORAL | Status: DC

## 2013-12-12 MED ORDER — ISOSORBIDE MONONITRATE ER 30 MG PO TB24: 30.0000 mg | ORAL_TABLET | Freq: Every day | ORAL | Status: DC

## 2013-12-12 NOTE — Progress Notes (Signed)
OFFICE NOTE  12/12/2013  CC:  Chief Complaint  Patient presents with  . Follow-up    fasting   HPI: Patient is a 73 y.o. Caucasian male who is here for 2 mo f/u DM 2.  Glucoses still 200s to 300s in daytime and evening, mornings are averaging 120s.  Usual levemir insulin dose is 40 units. 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    He reports "some" tingling in feet, no burning or numbness.  No hx of foot ulcer.   Pertinent PMH:  Past medical, surgical, social, and family history reviewed and no changes are noted since last office visit.  MEDS:  Outpatient Prescriptions Prior to Visit  Medication Sig Dispense Refill  . acetaminophen (TYLENOL) 325 MG tablet Take 2 tablets (650 mg total) by mouth every 4 (four) hours as needed for headache or mild pain.    [name] aspirin 81 MG tablet Take 81 mg by mouth daily.    . budesonide-formoterol (SYMBICORT) 160-4.5 MCG/ACT inhaler Inhale 2 puffs into the lungs 2 (two) times daily. 1 Inhaler 12  . buPROPion (WELLBUTRIN SR) 150 MG 12 hr tablet Take 150 mg by mouth 2 (two) times daily. Pt admits to frequent missed doses    . chlorthalidone (HYGROTON) 25 MG tablet TAKE ONE TABLET BY MOUTH EVERY DAY 90 tablet 1  . Cholecalciferol (VITAMIN D-3) 1000 UNITS CAPS Take 1,000 Units by mouth daily.     . Chromium Picolinate 1000 MCG TABS Take 1,000 mcg by mouth daily.     . furosemide (LASIX) 40 MG tablet Take 0.5 tablets (20 mg total) by mouth daily. 30 tablet 1  . glimepiride (AMARYL) 4 MG tablet Take 1 tablet (4 mg total) by mouth daily with breakfast. 180 tablet 1  . Insulin Detemir (LEVEMIR FLEXPEN) 100 UNIT/ML Pen Inject 30-39 Units into the skin at bedtime. 15 mL 3  . lovastatin (MEVACOR) 40 MG tablet TAKE ONE TABLET BY MOUTH TWICE DAILY 180 tablet 1  . metFORMIN (GLUCOPHAGE) 1000 MG tablet Take 1 tablet (1,000 mg total) by mouth 2 (two) times daily with a  meal. 180 tablet 1  . metFORMIN (GLUCOPHAGE) 1000 MG tablet TAKE ONE TABLET BY MOUTH TWICE DAILY WITH A MEAL 180 tablet 1  . metoprolol tartrate (LOPRESSOR) 25 MG tablet Take 0.5 tablets (12.5 mg total) by mouth 2 (two) times daily. 60 tablet 5  . Multiple Vitamin (MULTIVITAMIN WITH MINERALS) TABS Take 1 tablet by mouth daily.    . pioglitazone (ACTOS) 45 MG tablet TAKE ONE TABLET BY MOUTH EVERY DAY 90 tablet 1  . potassium chloride SA (K-DUR,KLOR-CON) 20 MEQ tablet Take 1 tablet (20 mEq total) by mouth daily. 30 tablet 1  . ramipril (ALTACE) 10 MG capsule TAKE 1 CAPSULE BY MOUTH TWICE DAILY 180 capsule 1  . tamsulosin (FLOMAX) 0.4 MG CAPS capsule TAKE 1 CAPSULE BY MOUTH EVERY DAY AFTER SUPPER 30 capsule 0  . ramipril (ALTACE) 10 MG capsule Take 1 capsule (10 mg total) by mouth daily. 30 capsule 1  . ferrous sulfate 325 (65 FE) MG tablet Take 325 mg by mouth daily with breakfast.    . nicotine (NICODERM CQ - DOSED IN MG/24 HR) 7 mg/24hr patch Place 7 mg onto the skin every other day.     No facility-administered medications prior to visit.    PE: Blood pressure 137/74, pulse 69, temperature 97.6 F (36.4 C),  temperature source Temporal, resp. rate 18, height _0  (1.803 m), weight 219 lb (99.338 kg), SpO2 99 %. Gen: Alert, well appearing.  Patient is oriented to person, place, time, and situation. LE's no edema. Foot exam -  no swelling, tenderness or skin or vascular lesions. Color and temperature is normal. Sensation is intact. Peripheral pulses trace DP, PT not palpable on either side.. Toenails are thickened bilat..   IMPRESSION AND PLAN:  1) DM 2, poor control.  I cannot guide him in treatment adequately b/c he chooses to not use insulin during daytime due to job. I do not think he is making a realistic choice. Nevertheless, he will maximize amaryl to 8 mg qAM, continue full dose actos and full dose metformin. Foot exam normal today.  HbA1c today.  Continue 40 U levemir qhs. BMET  today.  2) HTN; The current medical regimen is effective;  continue present plan and medications.   An After Visit Summary was printed and given to the patient.  Spent 55mn with pt today, with >50% of this time spent in counseling and care coordination regarding the above problems.  FOLLOW UP: 4 mo

## 2013-12-13 ENCOUNTER — Encounter (HOSPITAL_COMMUNITY): Payer: Commercial Managed Care - HMO

## 2013-12-14 ENCOUNTER — Other Ambulatory Visit: Payer: Self-pay | Admitting: Family Medicine

## 2013-12-14 ENCOUNTER — Encounter (HOSPITAL_COMMUNITY): Payer: Self-pay | Admitting: Interventional Cardiology

## 2013-12-15 ENCOUNTER — Encounter (HOSPITAL_COMMUNITY): Payer: Commercial Managed Care - HMO

## 2013-12-18 ENCOUNTER — Ambulatory Visit (INDEPENDENT_AMBULATORY_CARE_PROVIDER_SITE_OTHER): Payer: Commercial Managed Care - HMO | Admitting: Interventional Cardiology

## 2013-12-18 ENCOUNTER — Encounter (HOSPITAL_COMMUNITY): Payer: Commercial Managed Care - HMO

## 2013-12-18 ENCOUNTER — Encounter: Payer: Self-pay | Admitting: Interventional Cardiology

## 2013-12-18 VITALS — BP 136/64 | HR 90 | Ht 71.0 in | Wt 220.8 lb

## 2013-12-18 DIAGNOSIS — F17200 Nicotine dependence, unspecified, uncomplicated: Secondary | ICD-10-CM

## 2013-12-18 DIAGNOSIS — I2584 Coronary atherosclerosis due to calcified coronary lesion: Secondary | ICD-10-CM

## 2013-12-18 DIAGNOSIS — R5382 Chronic fatigue, unspecified: Secondary | ICD-10-CM

## 2013-12-18 DIAGNOSIS — I2510 Atherosclerotic heart disease of native coronary artery without angina pectoris: Secondary | ICD-10-CM

## 2013-12-18 NOTE — Patient Instructions (Signed)
Your physician recommends that you continue on your current medications as directed. Please refer to the Current Medication list given to you today.  Your physician wants you to follow-up in: 6 month with Dr. Irish Lack. You will receive a reminder letter in the mail two months in advance. If you don't receive a letter, please call our office to schedule the follow-up appointment.

## 2013-12-18 NOTE — Progress Notes (Signed)
Patient ID: Joshua [name], male   DOB: 1940-03-23, 73 y.o.   MRN: [id number] Patient ID: Joshua [name], male   DOB: [birth year], 73 y.o.   MRN: [id number]    [place], [address], [place]  [address] Phone: [phone] Fax:  [phone]  Date:  12/18/2013   ID:  Joshua [name], Joshua [name] [birth year], MRN [id number]  PCP:  Joshua Sou, MD      History of Present Illness: Joshua [name] is a 73 y.o. male who has had pericarditis in June 2015.[name] He underwent cardiac cath after this time and had three-vessel coronary artery disease. He underwent bypass surgery. His wounds have healed well and he denies any chest discomfort. He feels that his energy level is improving.  He did report that the walk from his car to cardiac rehabilitation was too much. He stop doing the program because it was exited difficult just to get to the location of where cardiac rehabilitation was being done.   The sternal wound is healing well. He has had some mild lower extremity swelling. This is not too bothersome.  He has stopped taking Lasix at this point.  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  Wt Readings from Last 3 Encounters:  12/18/13 220 lb 12.8 oz (100.154 kg)  12/12/13 219 lb (99.338 kg)  10/12/13 203 lb (92.08 kg)     Past Medical History  Diagnosis Date  . Hypertension   . Tobacco dependence     Prior failure to quit with wellbutrin.  100+ pack-yr history.  Quit for 1 mo to get hip replacement (1 pack/day)  . Obesity   . Hyperplastic colon polyp 2001  . Erectile dysfunction     Normal testosterone  . Adenomatous colon polyp 10/16/2011    Repeat 2018  . 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 pylori neg, no dysplasia or malignancy.  . Macular degeneration, dry     Mild, bilat (Optometrist, Mayford Knife at [facility] in [place], [place])  . 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  . 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 COPD (chronic obstructive pulmonary disease)     Spirometry  2004 borderline obstruction  . Hyperlipidemia   . 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 Other and unspecified angina pectoris   . DOE (dyspnea on exertion)   . Pneumonia   . DM type 2 (diabetes mellitus, type 2)     Poor control on max oral meds--pt eventually agreed to insulin therapy  . CAD, multiple vessel     3V CAD cath 08/08/13----CABG done shortly after.  . CKD (chronic kidney disease), stage I   . Diabetic nephropathy     Elevated urine microalb/cr 03/2011    Current Outpatient Prescriptions  Medication Sig Dispense Refill  . acetaminophen (TYLENOL) 325 MG tablet Take 2 tablets (650 mg total) by mouth every 4 (four) hours as needed for headache or mild pain.    .Marland Kitchenaspirin 81 MG tablet Take 81 mg by mouth daily.    . budesonide-formoterol (SYMBICORT) 160-4.5 MCG/ACT inhaler Inhale 2 puffs into the lungs 2 (two) times daily. 1 Inhaler 12  . buPROPion (WELLBUTRIN SR) 150 MG 12 hr tablet Take 150 mg  by mouth 2 (two) times daily. Pt admits to frequent missed doses    . chlorthalidone (HYGROTON) 25 MG tablet TAKE ONE TABLET BY MOUTH EVERY DAY 90 tablet 1  . Cholecalciferol (VITAMIN D-3) 1000 UNITS CAPS Take 1,000 Units by mouth daily.     . Chromium Picolinate 1000 MCG TABS Take 1,000 mcg by mouth daily.     [name] glimepiride (AMARYL) 4 MG tablet Take 2 tablets (8 mg total) by mouth daily with breakfast. 180 tablet 3  . Insulin Detemir (LEVEMIR FLEXPEN) 100 UNIT/ML Pen Inject 30-39 Units into the skin at bedtime. 15 mL 3  . isosorbide mononitrate (IMDUR) 30 MG 24 hr tablet Take 1 tablet (30 mg total) by mouth daily. 30 tablet 6  . lovastatin (MEVACOR) 40 MG tablet TAKE ONE TABLET BY MOUTH TWICE DAILY 180 tablet 1  . metFORMIN  (GLUCOPHAGE) 1000 MG tablet Take 1 tablet (1,000 mg total) by mouth 2 (two) times daily with a meal. 180 tablet 1  . metoprolol tartrate (LOPRESSOR) 25 MG tablet Take 0.5 tablets (12.5 mg total) by mouth 2 (two) times daily. 60 tablet 5  . Multiple Vitamin (MULTIVITAMIN WITH MINERALS) TABS Take 1 tablet by mouth daily.    . pioglitazone (ACTOS) 45 MG tablet Take 1 tablet (45 mg total) by mouth daily. 90 tablet 1  . ramipril (ALTACE) 10 MG capsule TAKE 1 CAPSULE BY MOUTH TWICE DAILY 180 capsule 1  . tamsulosin (FLOMAX) 0.4 MG CAPS capsule TAKE 1 CAPSULE BY MOUTH EVERY DAY AFTER SUPPER 30 capsule 0   No current facility-administered medications for this visit.    Allergies:    Allergies  Allergen Reactions  . Morphine And Related Other (See Comments)    Drenched with perspiration  . Demerol [Meperidine] Nausea Only  . Starlix [Nateglinide] Other (See Comments)    gassy    Social History:  The patient  reports that he quit smoking about 5 months ago. His smoking use included Cigarettes. He has a 62 pack-year smoking history. He quit smokeless tobacco use about 5 months ago. He reports that he does not drink alcohol or use illicit drugs.   Family History:  The patient's family history includes Breast cancer in his sister; CVA in his mother; Diabetes in his maternal uncle and paternal grandmother; Heart disease in his father and maternal uncle.   ROS:  Please see the history of present illness.  No nausea, vomiting.  No fevers, chills.  No focal weakness.  No dysuria. Energy level improving   All other systems reviewed and negative.   PHYSICAL EXAM: VS:  BP 136/64 mmHg  Pulse 90  Ht _0  (1.803 m)  Wt 220 lb 12.8 oz (100.154 kg)  BMI 30.81 kg/m2 Well nourished, well developed, in no acute distress HEENT: normal Neck: no JVD, no carotid bruits Cardiac:  normal S1, S2; RRR; well healing sternal wound Lungs:  clear to auscultation bilaterally, no wheezing, rhonchi or rales Abd: soft,  nontender, no hepatomegaly Ext: Mild bilateral lower extremity edema Skin: warm and dry Neuro:   no focal abnormalities noted Psych: Normal affect        ASSESSMENT AND PLAN:  1. CAD: s/p CABG- doing well.  Gaining strength. COuld not do cardiac rehab because it was too difficult.  He has gained some weight as well.   2. Tobacco abuse: he has started smoking again.  We talked about the benefits of him stopping smoking.  I would encouragehim to consider patches or gum to  try and help stop smoking.  I don't think he is quite mentally ready to quit at this point.  Failed to quit with Wellbutrin in the past. 3. Continue mevacor or lipid lowering therapy. LDL 63 in 6/15. 4. Edema: stopped Lasix 40 mg by mouth daily when necessary for lower extremity swelling.  Fluid status has been stable. 5. 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 We'll have to see what his work requirements are. If cardiac rehabilitation is too strenuous for him, it may be difficult to allow him to go back to work.  Regarding his fatigue, some of this is likely deconditioning. I asked him to try to increase his activity on his own. We certainly could have him reenroll in cardiac rehabilitation if he would be willing to consider this.  Signed, Mina Marble, MD, [facility] 12/18/2013 4:12 PM

## 2013-12-20 ENCOUNTER — Encounter (HOSPITAL_COMMUNITY): Payer: Commercial Managed Care - HMO

## 2013-12-22 ENCOUNTER — Encounter (HOSPITAL_COMMUNITY): Payer: Commercial Managed Care - HMO

## 2013-12-25 ENCOUNTER — Encounter (HOSPITAL_COMMUNITY): Payer: Commercial Managed Care - HMO

## 2013-12-27 ENCOUNTER — Encounter (HOSPITAL_COMMUNITY): Payer: Commercial Managed Care - HMO

## 2013-12-28 ENCOUNTER — Telehealth: Payer: Self-pay | Admitting: Family Medicine

## 2013-12-28 MED ORDER — TAMSULOSIN HCL 0.4 MG PO CAPS: ORAL_CAPSULE | ORAL | Status: DC

## 2013-12-28 NOTE — Telephone Encounter (Signed)
Tamsulosin [facility]. Patient will run out of med 12/30/13

## 2013-12-28 NOTE — Telephone Encounter (Signed)
Rx sent.

## 2014-01-01 ENCOUNTER — Encounter (HOSPITAL_COMMUNITY): Payer: Commercial Managed Care - HMO

## 2014-01-03 ENCOUNTER — Encounter (HOSPITAL_COMMUNITY): Payer: Commercial Managed Care - HMO

## 2014-01-05 DIAGNOSIS — H9193 Unspecified hearing loss, bilateral: Secondary | ICD-10-CM

## 2014-01-05 HISTORY — DX: Unspecified hearing loss, bilateral: H91.93

## 2014-01-08 ENCOUNTER — Telehealth: Payer: Self-pay | Admitting: Family Medicine

## 2014-01-08 MED ORDER — INSULIN DETEMIR 100 UNIT/ML FLEXPEN: 50.0000 [IU] | PEN_INJECTOR | Freq: Every day | SUBCUTANEOUS | Status: DC

## 2014-01-08 MED ORDER — BUPROPION HCL ER (SR) 150 MG PO TB12: 150.0000 mg | ORAL_TABLET | Freq: Two times a day (BID) | ORAL | Status: DC

## 2014-01-08 NOTE — Telephone Encounter (Signed)
Pt asked for Joshua [name] to call back in regards to RX.   Phone - [phone] [place]

## 2014-01-08 NOTE — Telephone Encounter (Signed)
Spoke to patient.  Rx's sent into [facility] and then resent to [facility].

## 2014-01-17 ENCOUNTER — Other Ambulatory Visit: Payer: Self-pay | Admitting: Family Medicine

## 2014-02-07 ENCOUNTER — Other Ambulatory Visit: Payer: Self-pay | Admitting: Family Medicine

## 2014-02-07 MED ORDER — METOPROLOL TARTRATE 25 MG PO TABS: 12.5000 mg | ORAL_TABLET | Freq: Two times a day (BID) | ORAL | Status: DC

## 2014-02-14 ENCOUNTER — Other Ambulatory Visit: Payer: Self-pay | Admitting: Family Medicine

## 2014-02-14 MED ORDER — ISOSORBIDE MONONITRATE ER 30 MG PO TB24: 30.0000 mg | ORAL_TABLET | Freq: Every day | ORAL | Status: DC

## 2014-02-14 MED ORDER — CHLORTHALIDONE 25 MG PO TABS: 25.0000 mg | ORAL_TABLET | Freq: Every day | ORAL | Status: DC

## 2014-02-14 MED ORDER — TAMSULOSIN HCL 0.4 MG PO CAPS: ORAL_CAPSULE | ORAL | Status: DC

## 2014-02-14 MED ORDER — GLIMEPIRIDE 4 MG PO TABS: 8.0000 mg | ORAL_TABLET | Freq: Every day | ORAL | Status: DC

## 2014-02-14 MED ORDER — RAMIPRIL 10 MG PO CAPS: 10.0000 mg | ORAL_CAPSULE | Freq: Two times a day (BID) | ORAL | Status: DC

## 2014-03-07 ENCOUNTER — Telehealth: Payer: Self-pay | Admitting: Family Medicine

## 2014-03-07 MED ORDER — FUROSEMIDE 40 MG PO TABS: ORAL_TABLET | ORAL | Status: DC

## 2014-03-07 NOTE — Telephone Encounter (Signed)
Pt called requesting rf of furosemide 40 mg.  Sign is 0.5 tab daily.  It isn't on pt's med list but he said his surgeon Dr. Koleen Nimrod put him on it.  Please advise.

## 2014-03-07 NOTE — Telephone Encounter (Signed)
I sent rx for lasix to his pharmacy in [place]. Ask if he is gaining wt (about how much and how rapidly?), seeing increased swelling in lower legs, or having more shortness of breath than his usual. I am trying to get an idea of why he wants to restart this med, b/c he has been doing fine off of it.-thx

## 2014-03-07 NOTE — Telephone Encounter (Signed)
Pt states that he is gaining weight.  He was 220 2/14 at his cardiologist and he is now 229lb.  Patient noticing some swelling in legs but no noticeable SOB.  Patient actually wanted 90 day supply to be sent to [facility].  Okay to fill 90 day supply?

## 2014-03-07 NOTE — Telephone Encounter (Signed)
Yes, 90 day supply ok BUT since there will be a delay in receiving this (I assume) via mail order, I recommend he pick up a 30d supply at his local pharmacy (which I have sent already).   Remind him that his sodium intake can make a big difference with this kind of fluid retention: tell him if he eats sodium, he can count on swelling and wt gain shortly after (tell him to try to limit sodium intake to < 2 grams per day. Remind him to elevate legs above the level of his heart for 20 min 1-2 times per day. Also, if he ends up taking lasix daily for more than 7 days in a row then I recommend he make a lab appointment to check his potassium level (BMET, dx peripheral edema)-thx.

## 2014-03-09 MED ORDER — FUROSEMIDE 40 MG PO TABS: ORAL_TABLET | ORAL | Status: DC

## 2014-03-09 NOTE — Telephone Encounter (Signed)
Pt is aware of all recommended information.  90 day supply sent.

## 2014-03-28 ENCOUNTER — Other Ambulatory Visit: Payer: Self-pay | Admitting: Family Medicine

## 2014-03-28 MED ORDER — GLIMEPIRIDE 4 MG PO TABS: 8.0000 mg | ORAL_TABLET | Freq: Every day | ORAL | Status: DC

## 2014-03-28 MED ORDER — FUROSEMIDE 40 MG PO TABS: ORAL_TABLET | ORAL | Status: DC

## 2014-04-11 ENCOUNTER — Other Ambulatory Visit: Payer: Self-pay | Admitting: *Deleted

## 2014-04-11 MED ORDER — PIOGLITAZONE HCL 45 MG PO TABS: 45.0000 mg | ORAL_TABLET | Freq: Every day | ORAL | Status: DC

## 2014-04-12 ENCOUNTER — Ambulatory Visit: Payer: Commercial Managed Care - HMO | Admitting: Family Medicine

## 2014-04-17 ENCOUNTER — Ambulatory Visit (INDEPENDENT_AMBULATORY_CARE_PROVIDER_SITE_OTHER): Payer: Medicare Other | Admitting: Family Medicine

## 2014-04-17 ENCOUNTER — Encounter: Payer: Self-pay | Admitting: Family Medicine

## 2014-04-17 VITALS — BP 90/52 | HR 68 | Temp 98.5°F | Ht 71.0 in | Wt 209.0 lb

## 2014-04-17 DIAGNOSIS — E118 Type 2 diabetes mellitus with unspecified complications: Secondary | ICD-10-CM

## 2014-04-17 DIAGNOSIS — I10 Essential (primary) hypertension: Secondary | ICD-10-CM

## 2014-04-17 DIAGNOSIS — E785 Hyperlipidemia, unspecified: Secondary | ICD-10-CM

## 2014-04-17 DIAGNOSIS — Z951 Presence of aortocoronary bypass graft: Secondary | ICD-10-CM | POA: Diagnosis not present

## 2014-04-17 DIAGNOSIS — F320 Major depressive disorder, single episode, mild: Secondary | ICD-10-CM

## 2014-04-17 LAB — LIPID PANEL
Cholesterol: 161 mg/dL (ref 0–200)
HDL: 47.6 mg/dL
NonHDL: 113.4
Total CHOL/HDL Ratio: 3
Triglycerides: 218 mg/dL — ABNORMAL HIGH (ref 0.0–149.0)
VLDL: 43.6 mg/dL — ABNORMAL HIGH (ref 0.0–40.0)

## 2014-04-17 LAB — LDL CHOLESTEROL, DIRECT: Direct LDL: 77 mg/dL

## 2014-04-17 LAB — BASIC METABOLIC PANEL
BUN: 38 mg/dL — ABNORMAL HIGH (ref 6–23)
CHLORIDE: 98 meq/L (ref 96–112)
CO2: 31 mEq/L (ref 19–32)
CREATININE: 1.05 mg/dL (ref 0.40–1.50)
Calcium: 9.9 mg/dL (ref 8.4–10.5)
GFR: 73.19 mL/min (ref >60.00)
Glucose, Bld: 126 mg/dL — ABNORMAL HIGH (ref 70–99)
Potassium: 4.7 mEq/L (ref 3.5–5.1)
Sodium: 137 mEq/L (ref 135–145)

## 2014-04-17 LAB — HEMOGLOBIN A1C: Hgb A1c MFr Bld: 10.8 % — ABNORMAL HIGH (ref 4.6–6.5)

## 2014-04-17 MED ORDER — CITALOPRAM HYDROBROMIDE 20 MG PO TABS: 20.0000 mg | ORAL_TABLET | Freq: Every day | ORAL | Status: DC

## 2014-04-17 NOTE — Progress Notes (Signed)
OFFICE VISIT  04/17/2014   CC:  Chief Complaint  Patient presents with  . Follow-up    4 month   HPI:    Patient is a 74 y.o. Caucasian male who presents for 4 mo f/u DM 2 with diabetic nephropathy, HTN, CAD w/hx of CABG-normal LV function. He has not been able to do cardiac rehab b/c it was too difficult.  Unfortunately he still smokes some.  Denies CP, diaphoresis, SOB, or palpitations but admits he does not do anything strenuous.   Glucoses not well controlled, fasting and postprandials. Taking levemir 40 U qhs.  Admits to not eating right.  Denies CP but admits he doesn't really feel like doing much of anything.  He is willing to do multidose insulin (basal + bolus).   He no longer plans to work.  He admits he is depressed (at least a couple months hx) b/c of his lack of ability to do the things he used to do, some hopelessness, anhedonia.  No SI or HI.  Has little motivation to do anything other than go from bedroom to recliner to kitchen and back, etc.   Past Medical History  Diagnosis Date  . Hypertension   . Tobacco dependence     Prior failure to quit with wellbutrin.  100+ pack-yr history.  Quit for 1 mo to get hip replacement (1 pack/day)  . Obesity   . Hyperplastic colon polyp 2001  . Erectile dysfunction     Normal testosterone  . Adenomatous colon polyp 10/16/2011    Repeat 2018  . 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 pylori neg, no dysplasia or malignancy.  . Macular degeneration, dry     Mild, bilat (Optometrist, Mayford Knife at [facility] in [place], [place])  . 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  . 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 COPD (chronic obstructive pulmonary disease)     Spirometry  2004 borderline obstruction  . Hyperlipidemia   . Pericardial effusion with cardiac tamponade 6//29/15     Infectious/inflamm (cytology showed NO MALIGNANT CELLS)  . Other and unspecified angina pectoris   . DOE (dyspnea on exertion)   . Pneumonia   . DM type 2 (diabetes mellitus, type 2)     Poor control on max oral meds--pt eventually agreed to insulin therap  . CAD, multiple vessel     3V CAD cath 08/08/13----CABG done shortly after.  . Chronic renal insufficiency, stage II (mild)   . Diabetic nephropathy     Elevated urine microalb/cr 03/2011    Past Surgical History  Procedure Laterality Date  . Cholecystectomy open  1999  . Appendectomy  1957  . Hip surgery Right 1954    Repair of slipped capital femoral epiphysis.  . Patella fracture surgery Left ~ 1979    bolt + 3 screws to repair tib plateau fx  . Testicle surgery  as a child    Undescended testicle brought down into scrotum  . Tonsillectomy  1947  . Colonoscopy  10/16/2011    Procedure: COLONOSCOPY;  Surgeon: RInda Castle MD;  Location: [facility];  Service: Endoscopy;  Laterality: N/A;  . Cataract extraction w/ intraocular lens  implant, bilateral  04/08/2006 & 04/22/2006  . Esophagogastroduodenoscopy  02/25/12    Atrophic gastritis with a few erosions--capsule endoscopy planned as of 02/25/12 (  Dr. Deatra Ina).  . Total hip arthroplasty Right 08/12/2012    Procedure: REMOVAL OF OLD PINS RIGHT HIP AND RIGHT TOTAL HIP ARTHROPLASTY ANTERIOR APPROACH;  Surgeon: Mcarthur Rossetti, MD;  Location: [facility];  Service: Orthopedics;  Laterality: Right;  . Hardware removal Right 08/12/2012    Procedure: HARDWARE REMOVAL;  Surgeon: Mcarthur Rossetti, MD;  Location: [facility];  Service: Orthopedics;  Laterality: Right;  . Cardiac catheterization  08/08/2013  . Cataract extraction w/ intraocular lens  implant, bilateral Bilateral   . Coronary artery bypass graft N/A 08/14/2013    Procedure: CORONARY ARTERY BYPASS GRAFTING (CABG);  Surgeon: Melrose Nakayama, MD;  Location: [facility];  Service: Open Heart Surgery;  Laterality: N/A;  Times 4    using left internal mammary artery and endoscopically harvested bilateral saphenous vein  . Intraoperative transesophageal echocardiogram N/A 08/14/2013    Normal LV function. Procedure: INTRAOPERATIVE TRANSESOPHAGEAL ECHOCARDIOGRAM;  Surgeon: Melrose Nakayama, MD;  Location: [facility];  Service: Open Heart Surgery;  Laterality: N/A;  . Pericardial tap N/A 07/01/2013    Procedure: PERICARDIAL TAP;  Surgeon: Jettie Booze, MD;  Location: [facility] CATH LAB;  Service: Cardiovascular;  Laterality: N/A;  . Left heart catheterization with coronary angiogram N/A 08/08/2013    Procedure: LEFT HEART CATHETERIZATION WITH CORONARY ANGIOGRAM;  Surgeon: Jettie Booze, MD;  Location: [facility] CATH LAB;  Service: Cardiovascular;  Laterality: N/A;    Outpatient Prescriptions Prior to Visit  Medication Sig Dispense Refill  . acetaminophen (TYLENOL) 325 MG tablet Take 2 tablets (650 mg total) by mouth every 4 (four) hours as needed for headache or mild pain.    [name] aspirin 81 MG tablet Take 81 mg by mouth daily.    . budesonide-formoterol (SYMBICORT) 160-4.5 MCG/ACT inhaler Inhale 2 puffs into the lungs 2 (two) times daily. 1 Inhaler 12  . buPROPion (WELLBUTRIN SR) 150 MG 12 hr tablet Take 1 tablet (150 mg total) by mouth 2 (two) times daily. Pt admits to frequent missed doses 60 tablet 1  . chlorthalidone (HYGROTON) 25 MG tablet Take 1 tablet (25 mg total) by mouth daily. 90 tablet 1  . Cholecalciferol (VITAMIN D-3) 1000 UNITS CAPS Take 1,000 Units by mouth daily.     . Chromium Picolinate 1000 MCG TABS Take 1,000 mcg by mouth daily.     . furosemide (LASIX) 40 MG tablet 1/2-1 tab po qd prn as directed by MD 90 tablet 1  . glimepiride (AMARYL) 4 MG tablet Take 2 tablets (8 mg total) by mouth daily with breakfast. 180 tablet 1  . Insulin Detemir (LEVEMIR FLEXPEN) 100 UNIT/ML Pen Inject 50 Units into the skin at bedtime. 15 mL 3  . isosorbide mononitrate (IMDUR) 30 MG 24 hr tablet Take 1 tablet (30 mg total) by mouth  daily. 90 tablet 1  . lovastatin (MEVACOR) 40 MG tablet TAKE ONE TABLET BY MOUTH TWICE DAILY 180 tablet 1  . metFORMIN (GLUCOPHAGE) 1000 MG tablet Take 1 tablet (1,000 mg total) by mouth 2 (two) times daily with a meal. 180 tablet 1  . metFORMIN (GLUCOPHAGE) 1000 MG tablet TAKE ONE TABLET BY MOUTH TWICE DAILY WITH A MEAL 180 tablet 1  . Multiple Vitamin (MULTIVITAMIN WITH MINERALS) TABS Take 1 tablet by mouth daily.    . pioglitazone (ACTOS) 45 MG tablet Take 1 tablet (45 mg total) by mouth daily. 90 tablet 1  . ramipril (ALTACE) 10 MG capsule Take 1 capsule (10 mg total) by mouth 2 (two) times daily. 180 capsule  1  . tamsulosin (FLOMAX) 0.4 MG CAPS capsule TAKE 1 CAPSULE BY MOUTH EVERY DAY AFTER SUPPER 90 capsule 1  . buPROPion (WELLBUTRIN SR) 150 MG 12 hr tablet Take 1 tablet (150 mg total) by mouth 2 (two) times daily. Pt admits to frequent missed doses (Patient not taking: Reported on 04/17/2014) 180 tablet 1  . Insulin Detemir (LEVEMIR FLEXPEN) 100 UNIT/ML Pen Inject 50 Units into the skin at bedtime. (Patient not taking: Reported on 04/17/2014) 45 mL 1  . metoprolol tartrate (LOPRESSOR) 25 MG tablet Take 0.5 tablets (12.5 mg total) by mouth 2 (two) times daily. (Patient not taking: Reported on 04/17/2014) 180 tablet 1   No facility-administered medications prior to visit.    Allergies  Allergen Reactions  . Morphine And Related Other (See Comments)    Drenched with perspiration  . Demerol [Meperidine] Nausea Only  . Starlix [Nateglinide] Other (See Comments)    gassy    ROS As per HPI  PE: Blood pressure 90/52, pulse 68, temperature 98.5 F (36.9 C), temperature source Oral, height 5' 11" (1.803 m), weight 209 lb (94.802 kg), SpO2 97 %. Gen: Alert, well appearing.  Patient is oriented to person, place, time, and situation. AFFECT: pleasant, lucid thought and speech. He does tear up some when talking about his mood lately. No further exam today  LABS:  Lab Results  Component  Value Date   TSH 0.918 07/01/2013   Lab Results  Component Value Date   WBC 9.4 10/12/2013   HGB 13.8 10/12/2013   HCT 43.6 10/12/2013   MCV 83.9 10/12/2013   PLT 225.0 10/12/2013   Lab Results  Component Value Date   CREATININE 1.0 12/12/2013   BUN 29* 12/12/2013   NA 137 12/12/2013   K 4.9 12/12/2013   CL 99 12/12/2013   CO2 29 12/12/2013   Lab Results  Component Value Date   ALT 12 10/12/2013   AST 20 10/12/2013   ALKPHOS 89 10/12/2013   BILITOT 0.6 10/12/2013   Lab Results  Component Value Date   CHOL 126 07/04/2013   Lab Results  Component Value Date   HDL 36* 07/04/2013   Lab Results  Component Value Date   LDLCALC 63 07/04/2013   Lab Results  Component Value Date   TRIG 134 07/04/2013   Lab Results  Component Value Date   CHOLHDL 3.5 07/04/2013    IMPRESSION AND PLAN:  1) Depression; we'll add citalopram 69m qd to his wellbutrin SR 1555mbid. Therapeutic expectations and side effect profile of medication discussed today.  Patient's questions answered. F/u4 wks.  2) DM 2, poor control, noncompliant with diet and with insulin titration. He is afraid of increasing insulin b/c he is afraid of running out too early and having to pay more for insulin or go without.  3) HTN; The current medical regimen is effective;  continue present plan and medications.  4) Hyperlipidemia; it has been almost a year since last lipid panel. Tolerating statin well.  AST and ALT normal 10/2013. FLP today.  5) CAD, s/p CABG: has not done well after this from any energy/rehab standpoint. He doesn't plan to return to work now. Hopefully getting his depression better will help with motivation to push himself physically.  An After Visit Summary was printed and given to the patient.  FOLLOW UP: Return in about 4 weeks (around 05/15/2014) for f/u depression.

## 2014-04-17 NOTE — Progress Notes (Signed)
Pre visit review using our clinic review tool, if applicable. No additional management support is needed unless otherwise documented below in the visit note.

## 2014-04-19 ENCOUNTER — Other Ambulatory Visit: Payer: Self-pay | Admitting: Family Medicine

## 2014-04-19 MED ORDER — INSULIN LISPRO 100 UNIT/ML (KWIKPEN): PEN_INJECTOR | SUBCUTANEOUS | Status: DC

## 2014-04-24 ENCOUNTER — Other Ambulatory Visit: Payer: Self-pay | Admitting: Family Medicine

## 2014-05-16 ENCOUNTER — Ambulatory Visit (INDEPENDENT_AMBULATORY_CARE_PROVIDER_SITE_OTHER): Payer: Medicare Other | Admitting: Family Medicine

## 2014-05-16 ENCOUNTER — Encounter: Payer: Self-pay | Admitting: Family Medicine

## 2014-05-16 VITALS — BP 88/50 | HR 60 | Temp 98.1°F | Resp 16 | Wt 224.0 lb

## 2014-05-16 DIAGNOSIS — E118 Type 2 diabetes mellitus with unspecified complications: Secondary | ICD-10-CM | POA: Diagnosis not present

## 2014-05-16 DIAGNOSIS — I959 Hypotension, unspecified: Secondary | ICD-10-CM

## 2014-05-16 DIAGNOSIS — F329 Major depressive disorder, single episode, unspecified: Secondary | ICD-10-CM | POA: Diagnosis not present

## 2014-05-16 DIAGNOSIS — F32A Depression, unspecified: Secondary | ICD-10-CM

## 2014-05-16 MED ORDER — INSULIN DETEMIR 100 UNIT/ML FLEXPEN: 45.0000 [IU] | PEN_INJECTOR | Freq: Every day | SUBCUTANEOUS | Status: DC

## 2014-05-16 NOTE — Progress Notes (Signed)
Pre visit review using our clinic review tool, if applicable. No additional management support is needed unless otherwise documented below in the visit note.

## 2014-05-16 NOTE — Progress Notes (Signed)
OFFICE NOTE  05/16/2014  CC:  Chief Complaint  Patient presents with  . Follow-up   HPI: Patient is a 74 y.o. Caucasian male who is here for 1 mo f/u depression and poorly controlled DM 2. Started mealtime insulin (5 U each meal) last visit and increased levemir to 50 U qhs, stopped amaryl. Has had fasting glucoses sometimes in the70s and feels some mild hypoglycemic sx's. Feels like control/numbers overall are improved but he forgot to bring gluc log today.  Added citalopram 67m qd to his wellbutrin SR 1530mbid last month.  He feels no different but wife notes less testiness/irritability.  Occ monitors bp at home: he thinks the most recent check was 130/80, but this was 2-3 weeks ago. Occ feels light headed when he stands up but has not fallen.  Pertinent PMH:  Past medical, surgical, social, and family history reviewed and no changes are noted since last office visit.  MEDS:  Outpatient Prescriptions Prior to Visit  Medication Sig Dispense Refill  . acetaminophen (TYLENOL) 325 MG tablet Take 2 tablets (650 mg total) by mouth every 4 (four) hours as needed for headache or mild pain.    . Marland Kitchenspirin 81 MG tablet Take 81 mg by mouth daily.    . budesonide-formoterol (SYMBICORT) 160-4.5 MCG/ACT inhaler Inhale 2 puffs into the lungs 2 (two) times daily. 1 Inhaler 12  . buPROPion (WELLBUTRIN SR) 150 MG 12 hr tablet Take 1 tablet (150 mg total) by mouth 2 (two) times daily. Pt admits to frequent missed doses 60 tablet 1  . chlorthalidone (HYGROTON) 25 MG tablet Take 1 tablet (25 mg total) by mouth daily. 90 tablet 1  . Cholecalciferol (VITAMIN D-3) 1000 UNITS CAPS Take 1,000 Units by mouth daily.     . Chromium Picolinate 1000 MCG TABS Take 1,000 mcg by mouth daily.     . citalopram (CELEXA) 20 MG tablet Take 1 tablet (20 mg total) by mouth daily. 30 tablet 1  . furosemide (LASIX) 40 MG tablet 1/2-1 tab po qd prn as directed by MD 90 tablet 1  . Insulin Detemir (LEVEMIR FLEXPEN) 100  UNIT/ML Pen Inject 50 Units into the skin at bedtime. 15 mL 3  . insulin lispro (HUMALOG KWIKPEN) 100 UNIT/ML KiwkPen 5 U SQ with each meal 15 mL 11  . isosorbide mononitrate (IMDUR) 30 MG 24 hr tablet Take 1 tablet (30 mg total) by mouth daily. 90 tablet 1  . lovastatin (MEVACOR) 40 MG tablet TAKE ONE TABLET BY MOUTH TWICE DAILY 180 tablet 1  . metFORMIN (GLUCOPHAGE) 1000 MG tablet Take 1 tablet (1,000 mg total) by mouth 2 (two) times daily with a meal. 180 tablet 1  . metFORMIN (GLUCOPHAGE) 1000 MG tablet TAKE ONE TABLET BY MOUTH TWICE DAILY WITH A MEAL 180 tablet 1  . Multiple Vitamin (MULTIVITAMIN WITH MINERALS) TABS Take 1 tablet by mouth daily.    . pioglitazone (ACTOS) 45 MG tablet Take 1 tablet (45 mg total) by mouth daily. 90 tablet 1  . ramipril (ALTACE) 10 MG capsule Take 1 capsule (10 mg total) by mouth 2 (two) times daily. 180 capsule 1  . tamsulosin (FLOMAX) 0.4 MG CAPS capsule TAKE 1 CAPSULE BY MOUTH EVERY DAY AFTER SUPPER 90 capsule 1   No facility-administered medications prior to visit.    PE: Blood pressure 88/50, pulse 60, temperature 98.1 F (36.7 C), temperature source Oral, resp. rate 16, weight 224 lb (101.606 kg), SpO2 96 %. Gen: alert, tired-appearing but in NAD. AFFECT: pleasant,  lucid thought and speech. IWP:YKDX: no injection, icteris, swelling, or exudate.  EOMI, PERRLA. Mouth: lips without lesion/swelling.  Oral mucosa pink and moist. Oropharynx without erythema, exudate, or swelling.  CV: RRR, distant S1 and S2, no audible murmur/rub/gallop Chest is clear, no wheezing or rales. Normal symmetric air entry throughout both lung fields. No chest wall deformities or tenderness. EXT: no edema in Right leg, 2+ pitting edema in L lower leg mainly in ankle  12 lead EKG today: NSR, rate 60, no ischemic changes.  Compared to last EKG, nonspecific ST/T wave changes have resolved.  IMPRESSION AND PLAN:  1) Depression: mild improvement noted per wife but none felt by  pt.  No adverse side effects from citalopram. Continue current antidepressant regimen for now--give it more time.  2) DM 2, improving control since adding on mealtime insulin.  However, fastings sometimes borderline low so will decrease levemir back to 45 U qhs.  3) Hypotension, symptomatic (orthostatic dizziness occ): hx of CAD, s/p CABG--LV function normal. Repeat EKG today: reassuring. Will d/c his chlorthalidone, encourage more aggressive oral hydration as it seems he is not paying much attention to this.  An After Visit Summary was printed and given to the patient.   FOLLOW UP: 1 mo

## 2014-05-24 ENCOUNTER — Other Ambulatory Visit: Payer: Self-pay | Admitting: Family Medicine

## 2014-05-24 ENCOUNTER — Encounter: Payer: Self-pay | Admitting: Family Medicine

## 2014-05-24 MED ORDER — FUROSEMIDE 40 MG PO TABS: ORAL_TABLET | ORAL | Status: DC

## 2014-05-24 MED ORDER — LOVASTATIN 40 MG PO TABS: 40.0000 mg | ORAL_TABLET | Freq: Two times a day (BID) | ORAL | Status: DC

## 2014-05-24 MED ORDER — ISOSORBIDE MONONITRATE ER 30 MG PO TB24: 30.0000 mg | ORAL_TABLET | Freq: Every day | ORAL | Status: DC

## 2014-05-24 MED ORDER — TAMSULOSIN HCL 0.4 MG PO CAPS: ORAL_CAPSULE | ORAL | Status: DC

## 2014-05-24 MED ORDER — INSULIN DETEMIR 100 UNIT/ML FLEXPEN: 45.0000 [IU] | PEN_INJECTOR | Freq: Every day | SUBCUTANEOUS | Status: DC

## 2014-05-24 MED ORDER — INSULIN LISPRO 100 UNIT/ML (KWIKPEN): PEN_INJECTOR | SUBCUTANEOUS | Status: DC

## 2014-05-24 MED ORDER — CITALOPRAM HYDROBROMIDE 20 MG PO TABS: 20.0000 mg | ORAL_TABLET | Freq: Every day | ORAL | Status: DC

## 2014-05-24 MED ORDER — METFORMIN HCL 1000 MG PO TABS: 1000.0000 mg | ORAL_TABLET | Freq: Two times a day (BID) | ORAL | Status: DC

## 2014-05-24 MED ORDER — RAMIPRIL 10 MG PO CAPS: 10.0000 mg | ORAL_CAPSULE | Freq: Two times a day (BID) | ORAL | Status: DC

## 2014-05-24 MED ORDER — PIOGLITAZONE HCL 45 MG PO TABS: 45.0000 mg | ORAL_TABLET | Freq: Every day | ORAL | Status: DC

## 2014-05-24 MED ORDER — BUPROPION HCL ER (SR) 150 MG PO TB12: ORAL_TABLET | ORAL | Status: DC

## 2014-05-24 NOTE — Telephone Encounter (Signed)
Patient came into office today saying [facility] needs new Rx's on all patient's prescriptions.  I was comparing his list to our med list and he still have metoprolol and glimepiride on his list.  Can you please advise?  All meds need to be 90 day supply.

## 2014-06-11 ENCOUNTER — Ambulatory Visit (INDEPENDENT_AMBULATORY_CARE_PROVIDER_SITE_OTHER): Payer: Medicare Other | Admitting: Family Medicine

## 2014-06-11 ENCOUNTER — Encounter: Payer: Self-pay | Admitting: Family Medicine

## 2014-06-11 ENCOUNTER — Encounter: Payer: Self-pay | Admitting: *Deleted

## 2014-06-11 VITALS — BP 121/69 | HR 71 | Temp 98.4°F | Resp 16 | Wt 230.0 lb

## 2014-06-11 DIAGNOSIS — F329 Major depressive disorder, single episode, unspecified: Secondary | ICD-10-CM

## 2014-06-11 DIAGNOSIS — Z862 Personal history of diseases of the blood and blood-forming organs and certain disorders involving the immune mechanism: Secondary | ICD-10-CM

## 2014-06-11 DIAGNOSIS — N182 Chronic kidney disease, stage 2 (mild): Secondary | ICD-10-CM

## 2014-06-11 DIAGNOSIS — E118 Type 2 diabetes mellitus with unspecified complications: Secondary | ICD-10-CM | POA: Diagnosis not present

## 2014-06-11 DIAGNOSIS — F32A Depression, unspecified: Secondary | ICD-10-CM

## 2014-06-11 DIAGNOSIS — I959 Hypotension, unspecified: Secondary | ICD-10-CM

## 2014-06-11 DIAGNOSIS — I9589 Other hypotension: Secondary | ICD-10-CM

## 2014-06-11 LAB — CBC
HEMATOCRIT: 41.3 % (ref 39.0–52.0)
Hemoglobin: 13.4 g/dL (ref 13.0–17.0)
MCHC: 32.5 g/dL (ref 30.0–36.0)
MCV: 83.2 fl (ref 78.0–100.0)
Platelets: 193 10*3/uL (ref 150.0–400.0)
RBC: 4.96 Mil/uL (ref 4.22–5.81)
RDW: 17.9 % — ABNORMAL HIGH (ref 11.5–15.5)
WBC: 8.1 10*3/uL (ref 4.0–10.5)

## 2014-06-11 LAB — BASIC METABOLIC PANEL
BUN: 26 mg/dL — AB (ref 6–23)
CO2: 24 mEq/L (ref 19–32)
Calcium: 9.2 mg/dL (ref 8.4–10.5)
Chloride: 103 mEq/L (ref 96–112)
Creatinine, Ser: 0.87 mg/dL (ref 0.40–1.50)
GFR: 90.89 mL/min (ref >60.00)
GLUCOSE: 246 mg/dL — AB (ref 70–99)
Potassium: 4.5 mEq/L (ref 3.5–5.1)
Sodium: 135 mEq/L (ref 135–145)

## 2014-06-11 NOTE — Patient Instructions (Addendum)
Decrease levemir at bedtime to 35 U every night.  Increase mealtime insulin (humalog) to 10 U every meal.  Drink more water! Try to drink at least EIGHT glasses of water per day (8 oz each glass).

## 2014-06-11 NOTE — Progress Notes (Signed)
Pre visit review using our clinic review tool, if applicable. No additional management support is needed unless otherwise documented below in the visit note.

## 2014-06-11 NOTE — Progress Notes (Signed)
OFFICE NOTE  06/11/2014  CC:  Chief Complaint  Patient presents with  . Follow-up    4 month f/u. Pt is not fasting.   HPI: Patient is a 74 y.o. Caucasian male who is here for 53mof/u poorly controlled DM, depression, and symptomatic hypotension. I d/c'd his chlorthalidone and encouraged more aggressive oral hydration last visit. His fluid intake is poor: 2 cups coffee minimum per day, only one glass water.  No further episodes of symptomatic hypotension.  Has hx of iron def anemia and is on lifetime iron supplementation, needs q663moBCs to monitor (hx of 2 AVMs on capsule endoscopy 2014).  DM2: Levemir 35-45 U qhs, and humalog 5 U qAC. Review of the last months glucoses: fastings vary wildly (60s to 200).  He alters hs levemir dose sometimes, depending on elevated bedtime glucose or not.  He does feel hypoglycemic sx's when he is in the 60s.   2H PP check (supper) is usually over 200.  Depression has improved signif w / addition of citalopram.  Still has some brief periods in which he "just don't want to do nothing".  No SI or HI.  Pertinent PMH:  Past medical, surgical, social, and family history reviewed and no changes are noted since last office visit.   MEDS:  Outpatient Prescriptions Prior to Visit  Medication Sig Dispense Refill  . acetaminophen (TYLENOL) 325 MG tablet Take 2 tablets (650 mg total) by mouth every 4 (four) hours as needed for headache or mild pain.    . Marland Kitchenspirin 81 MG tablet Take 81 mg by mouth daily.    . budesonide-formoterol (SYMBICORT) 160-4.5 MCG/ACT inhaler Inhale 2 puffs into the lungs 2 (two) times daily. 1 Inhaler 12  . buPROPion (WELLBUTRIN SR) 150 MG 12 hr tablet 1 tab po bid 180 tablet 1  . Cholecalciferol (VITAMIN D-3) 1000 UNITS CAPS Take 1,000 Units by mouth daily.     . Chromium Picolinate 1000 MCG TABS Take 1,000 mcg by mouth daily.     . citalopram (CELEXA) 20 MG tablet Take 1 tablet (20 mg total) by mouth daily. 90 tablet 1  . furosemide  (LASIX) 40 MG tablet 1/2-1 tab po qd prn as directed by MD 90 tablet 1  . Insulin Detemir (LEVEMIR FLEXPEN) 100 UNIT/ML Pen Inject 45 Units into the skin at bedtime. 50 mL 1  . insulin lispro (HUMALOG KWIKPEN) 100 UNIT/ML KiwkPen 5 U SQ with each meal 45 mL 1  . isosorbide mononitrate (IMDUR) 30 MG 24 hr tablet Take 1 tablet (30 mg total) by mouth daily. 90 tablet 1  . lovastatin (MEVACOR) 40 MG tablet Take 1 tablet (40 mg total) by mouth 2 (two) times daily. 180 tablet 1  . metFORMIN (GLUCOPHAGE) 1000 MG tablet Take 1 tablet (1,000 mg total) by mouth 2 (two) times daily with a meal. 180 tablet 1  . Multiple Vitamin (MULTIVITAMIN WITH MINERALS) TABS Take 1 tablet by mouth daily.    . pioglitazone (ACTOS) 45 MG tablet Take 1 tablet (45 mg total) by mouth daily. 90 tablet 1  . ramipril (ALTACE) 10 MG capsule Take 1 capsule (10 mg total) by mouth 2 (two) times daily. 180 capsule 1  . tamsulosin (FLOMAX) 0.4 MG CAPS capsule TAKE 1 CAPSULE BY MOUTH EVERY DAY AFTER SUPPER (Patient not taking: Reported on 06/11/2014) 90 capsule 1   No facility-administered medications prior to visit.    PE: Blood pressure 121/69, pulse 71, temperature 98.4 F (36.9 C), temperature source Oral, resp.  rate 16, weight 230 lb (104.327 kg), SpO2 95 %. Gen: Alert, well appearing.  Patient is oriented to person, place, time, and situation. AFFECT: pleasant, lucid thought and speech. No further exam today.  LABS: Lab Results  Component Value Date   HGBA1C 10.8* 04/17/2014     Chemistry      Component Value Date/Time   NA 137 04/17/2014 0826   K 4.7 04/17/2014 0826   CL 98 04/17/2014 0826   CO2 31 04/17/2014 0826   BUN 38* 04/17/2014 0826   CREATININE 1.05 04/17/2014 0826      Component Value Date/Time   CALCIUM 9.9 04/17/2014 0826   ALKPHOS 89 10/12/2013 0847   AST 20 10/12/2013 0847   ALT 12 10/12/2013 0847   BILITOT 0.6 10/12/2013 0847     Lab Results  Component Value Date   WBC 9.4 10/12/2013   HGB  13.8 10/12/2013   HCT 43.6 10/12/2013   MCV 83.9 10/12/2013   PLT 225.0 10/12/2013   Lab Results  Component Value Date   CHOL 161 04/17/2014   HDL 47.60 04/17/2014   LDLCALC 63 07/04/2013   LDLDIRECT 77.0 04/17/2014   TRIG 218.0* 04/17/2014   CHOLHDL 3 04/17/2014    IMPRESSION AND PLAN:  1) DM 2, poor control but slowly improving. Erratic fastings, need to decrease levemir in order to consistently err on the high side/avoid hypoglycemia. Needs increase in mealtime insulin dosing. Instructions: Decrease levemir at bedtime to 35 U every night.  Increase mealtime insulin (humalog) to 10 U every meal.  2) Symptomatic hypotension: resolved.  He needs to drink more fluids. Instructions: Drink more water! Try to drink at least EIGHT glasses of water per day (8 oz each glass). Check BMET today.  3) Depression: improving, near remission.  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 Continue chronic/lifetime iron supplement. Continue wellbutrin SR 157m bid and citalopram 231mqd.  An After Visit Summary was printed and given to the patient.  FOLLOW UP: 6 weeks, recheck HbA1c at that time.

## 2014-06-12 ENCOUNTER — Other Ambulatory Visit: Payer: Self-pay | Admitting: Family Medicine

## 2014-06-18 ENCOUNTER — Ambulatory Visit (INDEPENDENT_AMBULATORY_CARE_PROVIDER_SITE_OTHER): Payer: Medicare Other | Admitting: Interventional Cardiology

## 2014-06-18 ENCOUNTER — Encounter: Payer: Self-pay | Admitting: Interventional Cardiology

## 2014-06-18 VITALS — BP 110/50 | HR 72 | Ht 71.0 in | Wt 235.0 lb

## 2014-06-18 DIAGNOSIS — E785 Hyperlipidemia, unspecified: Secondary | ICD-10-CM | POA: Diagnosis not present

## 2014-06-18 DIAGNOSIS — I2510 Atherosclerotic heart disease of native coronary artery without angina pectoris: Secondary | ICD-10-CM

## 2014-06-18 DIAGNOSIS — E118 Type 2 diabetes mellitus with unspecified complications: Secondary | ICD-10-CM

## 2014-06-18 DIAGNOSIS — E119 Type 2 diabetes mellitus without complications: Secondary | ICD-10-CM | POA: Insufficient documentation

## 2014-06-18 DIAGNOSIS — I2584 Coronary atherosclerosis due to calcified coronary lesion: Secondary | ICD-10-CM

## 2014-06-18 DIAGNOSIS — F17200 Nicotine dependence, unspecified, uncomplicated: Secondary | ICD-10-CM | POA: Diagnosis not present

## 2014-06-18 NOTE — Patient Instructions (Signed)
Medication Instructions:  Your physician recommends that you continue on your current medications as directed. Please refer to the Current Medication list given to you today.  Labwork:   Testing/Procedures:   Follow-Up:  Your physician wants you to follow-up in:  In one year with dr Glennon Hamilton will receive a reminder letter in the mail two months in advance. If you don't receive a letter, please call our office to schedule the follow-up appointment.   Any Other Special Instructions Will Be Listed Below (If Applicable).  Smoking Cessation Quitting smoking is important to your health and has many advantages. However, it is not always easy to quit since nicotine is a very addictive drug. Oftentimes, people try 3 times or more before being able to quit. This document explains the best ways for you to prepare to quit smoking. Quitting takes hard work and a lot of effort, but you can do it. ADVANTAGES OF QUITTING SMOKING  You will live longer, feel better, and live better.  Your body will feel the impact of quitting smoking almost immediately.  Within 20 minutes, blood pressure decreases. Your pulse returns to its normal level.  After 8 hours, carbon monoxide levels in the blood return to normal. Your oxygen level increases.  After 24 hours, the chance of having a heart attack starts to decrease. Your breath, hair, and body stop smelling like smoke.  After 48 hours, damaged nerve endings begin to recover. Your sense of taste and smell improve.  After 72 hours, the body is virtually free of nicotine. Your bronchial tubes relax and breathing becomes easier.  After 2 to 12 weeks, lungs can hold more air. Exercise becomes easier and circulation improves.  The risk of having a heart attack, stroke, cancer, or lung disease is greatly reduced.  After 1 year, the risk of coronary heart disease is cut in half.  After 5 years, the risk of stroke falls to the same as a nonsmoker.  After  10 years, the risk of lung cancer is cut in half and the risk of other cancers decreases significantly.  After 15 years, the risk of coronary heart disease drops, usually to the level of a nonsmoker.  If you are pregnant, quitting smoking will improve your chances of having a healthy baby.  The people you live with, especially any children, will be healthier.  You will have extra money to spend on things other than cigarettes. QUESTIONS TO THINK ABOUT BEFORE ATTEMPTING TO QUIT You may want to talk about your answers with your health care provider.  Why do you want to quit?  If you tried to quit in the past, what helped and what did not?  What will be the most difficult situations for you after you quit? How will you plan to handle them?  Who can help you through the tough times? Your family? Friends? A health care provider?  What pleasures do you get from smoking? What ways can you still get pleasure if you quit? Here are some questions to ask your health care provider:  How can you help me to be successful at quitting?  What medicine do you think would be best for me and how should I take it?  What should I do if I need more help?  What is smoking withdrawal like? How can I get information on withdrawal? GET READY  Set a quit date.  Change your environment by getting rid of all cigarettes, ashtrays, matches, and lighters in your home, car,  or work. Do not let people smoke in your home.  Review your past attempts to quit. Think about what worked and what did not. GET SUPPORT AND ENCOURAGEMENT You have a better chance of being successful if you have help. You can get support in many ways.  Tell your family, friends, and coworkers that you are going to quit and need their support. Ask them not to smoke around you.  Get individual, group, or telephone counseling and support. Programs are available at [employer] and health centers. Call your local health department for  information about programs in your area.  Spiritual beliefs and practices may help some smokers quit.  Download a "quit meter" on your computer to keep track of quit statistics, such as how long you have gone without smoking, cigarettes not smoked, and money saved.  Get a self-help book about quitting smoking and staying off tobacco. [place] yourself from urges to smoke. Talk to someone, go for a walk, or occupy your time with a task.  Change your normal routine. Take a different route to work. Drink tea instead of coffee. Eat breakfast in a different place.  Reduce your stress. Take a hot bath, exercise, or read a book.  Plan something enjoyable to do every day. Reward yourself for not smoking.  Explore interactive web-based programs that specialize in helping you quit. GET MEDICINE AND USE IT CORRECTLY Medicines can help you stop smoking and decrease the urge to smoke. Combining medicine with the above behavioral methods and support can greatly increase your chances of successfully quitting smoking.  Nicotine replacement therapy helps deliver nicotine to your body without the negative effects and risks of smoking. Nicotine replacement therapy includes nicotine gum, lozenges, inhalers, nasal sprays, and skin patches. Some may be available over-the-counter and others require a prescription.  Antidepressant medicine helps people abstain from smoking, but how this works is unknown. This medicine is available by prescription.  Nicotinic receptor partial agonist medicine simulates the effect of nicotine in your brain. This medicine is available by prescription. Ask your health care provider for advice about which medicines to use and how to use them based on your health history. Your health care provider will tell you what side effects to look out for if you choose to be on a medicine or therapy. Carefully read the information on the package. Do not use any  other product containing nicotine while using a nicotine replacement product.  RELAPSE OR DIFFICULT SITUATIONS Most relapses occur within the first 3 months after quitting. Do not be discouraged if you start smoking again. Remember, most people try several times before finally quitting. You may have symptoms of withdrawal because your body is used to nicotine. You may crave cigarettes, be irritable, feel very hungry, cough often, get headaches, or have difficulty concentrating. The withdrawal symptoms are only temporary. They are strongest when you first quit, but they will go away within 10-14 days. To reduce the chances of relapse, try to:  Avoid drinking alcohol. Drinking lowers your chances of successfully quitting.  Reduce the amount of caffeine you consume. Once you quit smoking, the amount of caffeine in your body increases and can give you symptoms, such as a rapid heartbeat, sweating, and anxiety.  Avoid smokers because they can make you want to smoke.  Do not let weight gain distract you. Many smokers will gain weight when they quit, usually less than 10 pounds. Eat a healthy diet and stay active. You  can always lose the weight gained after you quit.  Find ways to improve your mood other than smoking. FOR MORE INFORMATION  www.smokefree.gov  Document Released: 12/16/2000 Document Revised: 05/08/2013 Document Reviewed: 04/02/2011 [facility] Patient Information 2015 [place], [place]. This information is not intended to replace advice given to you by your health care provider. Make sure you discuss any questions you have with your health care provider.

## 2014-06-18 NOTE — Progress Notes (Signed)
Patient ID: Joshua [name], male   DOB: [birth year], 74 y.o.   MRN: [id number]     Cardiology Office Note   Date:  06/18/2014   ID:  Teagen, Mcleary Nov 29, 1940, MRN [id number]  PCP:  Tammi Sou, MD    No chief complaint on file. CAD   Wt Readings from Last 3 Encounters:  06/18/14 235 lb (106.595 kg)  06/11/14 230 lb (104.327 kg)  05/16/14 224 lb (101.606 kg)       History of Present Illness: [name] is a 74 y.o. male  who has had pericarditis in June 2015.[name] He underwent cardiac cath after this time and had three-vessel coronary artery disease. He underwent bypass surgery. His wounds have healed well and he denies any chest discomfort. He feels that his energy level is improving. He did report that the walk from his car to cardiac rehabilitation was too much. He stopped doing the program because it was difficult just to get to the location of where cardiac rehabilitation was being done.  The sternal wound healed well. He has had some mild lower extremity swelling bilaterally. This is not too bothersome. He has stopped taking Lasix at this point.  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  He had quit smoking for 2-3 months around the time of surgery but he resumed cigarettes.  He was depressed and used this to cope.   He feels some SHOB with walking distance as well.   He has had high blood sugars.  He A1C was > 10.       Past Medical History  Diagnosis Date  . Hypertension   . Tobacco dependence in remission     100+ pack-yr hx: quit after CABG  . Obesity   . Hyperplastic colon polyp 2001  . Erectile dysfunction     Normal testosterone  . Adenomatous colon polyp 10/16/2011    Repeat 2018  . 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 pylori neg, no dysplasia or malignancy.  . Macular degeneration, dry     Mild, bilat (Optometrist, Mayford Knife at [facility] in [place], [place])  .  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  . 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 COPD (chronic obstructive pulmonary disease)     Spirometry  2004 borderline obstruction  . Hyperlipidemia   . 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 Other and unspecified angina pectoris   . DOE (dyspnea on exertion)   . Pneumonia   . DM type 2 (diabetes mellitus, type 2)     Poor control on max oral meds--pt eventually agreed to insulin therap  . CAD, multiple vessel     3V CAD cath 08/08/13----CABG done shortly after.  . Chronic renal insufficiency, stage II (mild)   . Diabetic nephropathy     Elevated urine microalb/cr 03/2011  . Hearing loss of both ears 2016    Hearing aids    Past Surgical History  Procedure Laterality Date  . Cholecystectomy open  1999  . Appendectomy  1957  . Hip surgery Right 1954    Repair of slipped capital femoral epiphysis.  . Patella fracture surgery Left ~ 1979    bolt + 3 screws to repair tib plateau  fx  . Testicle surgery  as a child    Undescended testicle brought down into scrotum  . Tonsillectomy  1947  . Colonoscopy  10/16/2011    Procedure: COLONOSCOPY;  Surgeon: Inda Castle, MD;  Location: [facility];  Service: Endoscopy;  Laterality: N/A;  . Cataract extraction w/ intraocular lens  implant, bilateral  04/08/2006 & 04/22/2006  . Esophagogastroduodenoscopy  02/25/12    Atrophic gastritis with a few erosions--capsule endoscopy planned as of 02/25/12 (Dr. Deatra Ina).  . Total hip arthroplasty Right 08/12/2012    Procedure: REMOVAL OF OLD PINS RIGHT HIP AND RIGHT TOTAL HIP ARTHROPLASTY ANTERIOR APPROACH;  Surgeon: Mcarthur Rossetti, MD;  Location: [facility];  Service: Orthopedics;  Laterality: Right;  . Hardware removal Right 08/12/2012    Procedure: HARDWARE REMOVAL;  Surgeon:  Mcarthur Rossetti, MD;  Location: [facility];  Service: Orthopedics;  Laterality: Right;  . Cardiac catheterization  08/08/2013  . Cataract extraction w/ intraocular lens  implant, bilateral Bilateral   . Coronary artery bypass graft N/A 08/14/2013    Procedure: CORONARY ARTERY BYPASS GRAFTING (CABG);  Surgeon: Melrose Nakayama, MD;  Location: [facility];  Service: Open Heart Surgery;  Laterality: N/A;  Times 4   using left internal mammary artery and endoscopically harvested bilateral saphenous vein  . Intraoperative transesophageal echocardiogram N/A 08/14/2013    Normal LV function. Procedure: INTRAOPERATIVE TRANSESOPHAGEAL ECHOCARDIOGRAM;  Surgeon: Melrose Nakayama, MD;  Location: [facility];  Service: Open Heart Surgery;  Laterality: N/A;  . Pericardial tap N/A 07/01/2013    Procedure: PERICARDIAL TAP;  Surgeon: Jettie Booze, MD;  Location: [facility] CATH LAB;  Service: Cardiovascular;  Laterality: N/A;  . Left heart catheterization with coronary angiogram N/A 08/08/2013    Procedure: LEFT HEART CATHETERIZATION WITH CORONARY ANGIOGRAM;  Surgeon: Jettie Booze, MD;  Location: [facility] CATH LAB;  Service: Cardiovascular;  Laterality: N/A;     Current Outpatient Prescriptions  Medication Sig Dispense Refill  . acetaminophen (TYLENOL) 325 MG tablet Take 2 tablets (650 mg total) by mouth every 4 (four) hours as needed for headache or mild pain.    [name] aspirin 81 MG tablet Take 81 mg by mouth daily.    . budesonide-formoterol (SYMBICORT) 160-4.5 MCG/ACT inhaler Inhale 2 puffs into the lungs 2 (two) times daily. 1 Inhaler 12  . buPROPion (WELLBUTRIN SR) 150 MG 12 hr tablet 1 tab po bid 180 tablet 1  . Cholecalciferol (VITAMIN D-3) 1000 UNITS CAPS Take 1,000 Units by mouth daily.     . Chromium Picolinate 1000 MCG TABS Take 1,000 mcg by mouth daily.     . citalopram (CELEXA) 20 MG tablet TAKE ONE TABLET BY MOUTH EVERY DAY 30 tablet 6  . furosemide (LASIX) 40 MG tablet 1/2-1 tab po qd prn as directed by MD  90 tablet 1  . Insulin Detemir (LEVEMIR FLEXPEN) 100 UNIT/ML Pen Inject 45 Units into the skin at bedtime. 50 mL 1  . insulin lispro (HUMALOG KWIKPEN) 100 UNIT/ML KiwkPen 5 U SQ with each meal 45 mL 1  . isosorbide mononitrate (IMDUR) 30 MG 24 hr tablet Take 1 tablet (30 mg total) by mouth daily. 90 tablet 1  . lovastatin (MEVACOR) 40 MG tablet Take 1 tablet (40 mg total) by mouth 2 (two) times daily. 180 tablet 1  . metFORMIN (GLUCOPHAGE) 1000 MG tablet Take 1 tablet (1,000 mg total) by mouth 2 (two) times daily with a meal. 180 tablet 1  . Multiple Vitamin (MULTIVITAMIN WITH MINERALS) TABS Take  1 tablet by mouth daily.    . pioglitazone (ACTOS) 45 MG tablet Take 1 tablet (45 mg total) by mouth daily. 90 tablet 1  . ramipril (ALTACE) 10 MG capsule Take 1 capsule (10 mg total) by mouth 2 (two) times daily. 180 capsule 1  . tamsulosin (FLOMAX) 0.4 MG CAPS capsule TAKE 1 CAPSULE BY MOUTH EVERY DAY AFTER SUPPER (Patient not taking: Reported on 06/11/2014) 90 capsule 1   No current facility-administered medications for this visit.    Allergies:   Morphine and related; Demerol; and Starlix    Social History:  The patient  reports that he quit smoking about a year ago. His smoking use included Cigarettes. He has a 62 pack-year smoking history. He quit smokeless tobacco use about a year ago. He reports that he does not drink alcohol or use illicit drugs.   Family History:  The patient's *family history includes Breast cancer in his sister; CVA in his mother; Diabetes in his maternal uncle and paternal grandmother; Heart disease in his father and maternal uncle.    ROS:  Please see the history of present illness.   Otherwise, review of systems are positive for DOE.   All other systems are reviewed and negative.    PHYSICAL EXAM: VS:  BP 110/50 mmHg  Pulse 72  Ht _0  (1.803 m)  Wt 235 lb (106.595 kg)  BMI 32.79 kg/m2 , BMI Body mass index is 32.79 kg/(m^2). GEN: Well nourished, well developed,  in no acute distress HEENT: normal Neck: no JVD, carotid bruits, or masses Cardiac: *RRR; no murmurs, rubs, or gallops,no edema  Respiratory:  clear to auscultation bilaterally, normal work of breathing GI: soft, nontender, nondistended, + BS MS: no deformity or atrophy, scar oin left knee- decreased range of motion of the left knee, slow gait Skin: warm and dry, no rash Neuro:  Strength and sensation are intact Psych: euthymic mood, full affect    Recent Labs: 06/30/2013: Pro B Natriuretic peptide (BNP) 1443.0* 07/01/2013: TSH 0.918 08/15/2013: Magnesium 2.4 10/12/2013: ALT 12 06/11/2014: BUN 26*; Creatinine, Ser 0.87; Hemoglobin 13.4; Platelets 193.0; Potassium 4.5; Sodium 135   Lipid Panel    Component Value Date/Time   CHOL 161 04/17/2014 0826   TRIG 218.0* 04/17/2014 0826   HDL 47.60 04/17/2014 0826   CHOLHDL 3 04/17/2014 0826   VLDL 43.6* 04/17/2014 0826   LDLCALC 63 07/04/2013 0325   LDLDIRECT 77.0 04/17/2014 0826     Other studies Reviewed: Additional studies/ records that were reviewed today with results demonstrating: CABG in 8/15.   ASSESSMENT AND PLAN:  1. CAD: s/p CABG- doing fairly well. COuld not do cardiac rehab because it was too difficult. He has gained some weight as well. DOE is likely multifactorial, including from COPD. Tobacco abuse: he is still smoking. We talked about the benefits of him stopping smoking. I would encouragehim to consider patches or gum to try and help stop smoking. I don't think he is quite mentally ready to quit at this point. Failed to quit with Wellbutrin in the past.  The patient was counseled on the dangers of tobacco use, both inhaled and oral, which include, but are not limited to cardiovascular disease, increased cancer risk of multiple types of cancer, COPD, peripheral vascular disease, strokes. He was also counseled on the benefits of smoking cessation. The patient was firmly advised to quit.    We also reviewed  strategies to maximize success, including: Removing cigarettes and smoking materials from environment Stress management Substitution  of other forms of reinforcement Support of family/friends. Selecting a quit date. Patient provided contact information for 1-800-QUIT-NOW   2. Continue mevacor or lipid lowering therapy. LDL 63 in 6/15.  4/16 Lipids reviewed and controlled.  3. Edema: using Lasix 20-40 mg by mouth daily when necessary for lower extremity swelling. Fluid status has been stable. 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  Due to DM being poorly controlled, he states he is not eligible at this time.  4. DM: Stressed importance of healthy diet to try and control blood sugar. Long-term, stopping smoking and control his blood sugar will be the key to keeping his bypass grafts open. We had a long discussion regarding this. We talked about exercise alternatives. Due to his old knee injury, walking is not easy for him. Consider water aerobics.  Current medicines are reviewed at length with the patient today.  The patient concerns regarding his medicines were addressed.  The following changes have been made:  No change  Labs/ tests ordered today include:  No orders of the defined types were placed in this encounter.    Recommend 150 minutes/week of aerobic exercise Low fat, low carb, high fiber diet recommended  Disposition:   FU in 1 year   Teresita Madura., MD  06/18/2014 8:23 AM    [facility] [address], [place], [place]  [address] Phone: [phone]; Fax: [phone]

## 2014-06-19 ENCOUNTER — Telehealth: Payer: Self-pay | Admitting: Family Medicine

## 2014-06-19 NOTE — Telephone Encounter (Signed)
Spoke to Coleta with [facility] pharmacy and she stated that they sent Rx for Lasix on 05/30/14. I advised her that pt stated that he did not receive Rx, address was confirmed and she is going to resent Rx. Pt has been advised and voiced understanding.

## 2014-06-19 NOTE — Telephone Encounter (Signed)
Pt is out of Lasix and his mail order insurance company said they mailed it 30 days ago. Pt has not received it and the insurance company is asking that the physician submit a new RX so they can mail him a new order.

## 2014-06-25 DIAGNOSIS — H35363 Drusen (degenerative) of macula, bilateral: Secondary | ICD-10-CM | POA: Diagnosis not present

## 2014-06-25 DIAGNOSIS — E119 Type 2 diabetes mellitus without complications: Secondary | ICD-10-CM | POA: Diagnosis not present

## 2014-07-06 ENCOUNTER — Telehealth: Payer: Self-pay | Admitting: Family Medicine

## 2014-07-06 NOTE — Telephone Encounter (Signed)
Joshua [name] called to inform us that he can no longer afford him Humalog. He is asking if he can change meds to Relign. Please call and advise.

## 2014-07-09 ENCOUNTER — Other Ambulatory Visit: Payer: Self-pay | Admitting: Family Medicine

## 2014-07-09 MED ORDER — INSULIN REGULAR HUMAN 100 UNIT/ML IJ SOLN: INTRAMUSCULAR | Status: DC

## 2014-07-09 NOTE — Telephone Encounter (Signed)
OK.  Relion rx sent to pharmacy. Stop humalog and start relion in its place.

## 2014-07-10 ENCOUNTER — Other Ambulatory Visit: Payer: Self-pay | Admitting: *Deleted

## 2014-07-10 MED ORDER — INSULIN REGULAR HUMAN 100 UNIT/ML IJ SOLN: INTRAMUSCULAR | Status: DC

## 2014-07-10 NOTE — Telephone Encounter (Signed)
Pt advised and voiced understanding. He wanted Rx sent to [facility]. Rx was sent to [facility]. Will resend Rx to [facility]. And cancel Rx at [facility] family pharmacy.

## 2014-07-17 ENCOUNTER — Other Ambulatory Visit: Payer: Self-pay | Admitting: Family Medicine

## 2014-07-23 ENCOUNTER — Encounter: Payer: Self-pay | Admitting: Family Medicine

## 2014-07-23 ENCOUNTER — Ambulatory Visit (INDEPENDENT_AMBULATORY_CARE_PROVIDER_SITE_OTHER): Payer: Medicare Other | Admitting: Family Medicine

## 2014-07-23 VITALS — BP 134/71 | HR 56 | Temp 98.4°F | Resp 16 | Ht 71.0 in | Wt 246.0 lb

## 2014-07-23 DIAGNOSIS — I10 Essential (primary) hypertension: Secondary | ICD-10-CM | POA: Diagnosis not present

## 2014-07-23 DIAGNOSIS — E118 Type 2 diabetes mellitus with unspecified complications: Secondary | ICD-10-CM | POA: Diagnosis not present

## 2014-07-23 DIAGNOSIS — F32A Depression, unspecified: Secondary | ICD-10-CM

## 2014-07-23 DIAGNOSIS — F329 Major depressive disorder, single episode, unspecified: Secondary | ICD-10-CM | POA: Diagnosis not present

## 2014-07-23 LAB — HEMOGLOBIN A1C: HEMOGLOBIN A1C: 7.3 % — AB (ref 4.6–6.5)

## 2014-07-23 MED ORDER — INSULIN REGULAR HUMAN 100 UNIT/ML IJ SOLN: INTRAMUSCULAR | Status: DC

## 2014-07-23 MED ORDER — CITALOPRAM HYDROBROMIDE 40 MG PO TABS: 40.0000 mg | ORAL_TABLET | Freq: Every day | ORAL | Status: DC

## 2014-07-23 MED ORDER — INSULIN DETEMIR 100 UNIT/ML FLEXPEN: 45.0000 [IU] | PEN_INJECTOR | Freq: Every day | SUBCUTANEOUS | Status: DC

## 2014-07-23 NOTE — Progress Notes (Signed)
OFFICE VISIT  07/23/2014   CC:  Chief Complaint  Patient presents with  . Follow-up     HPI:    Patient is a 74 y.o. Caucasian male who presents for 6 wk f/u DM 2 with nephropathy, HTN, depression.  Glucoses historically erratic, and he had been doing some inappropriate home management of insulins. Taking 10 U relion qAC and 25-30 U levemir and his morning glucoses have consistently been 40-60s fasting in AM since changing from novolog to Relion.  Mood; still struggling with lack of motivation, spells of sadness/depression.  No SI or HI. He and his wife are both in favor of up-titration of citalopram. He does have a slight UE tremor over the last year since we've been up-titrating antidepressants.  He is not doing any home bp monitoring.  ROS: chronic DOE, no CP, no dizziness, no falls.  Past Medical History  Diagnosis Date  . Hypertension   . Tobacco dependence in remission     100+ pack-yr hx: quit after CABG  . Obesity   . Hyperplastic colon polyp 2001  . Erectile dysfunction     Normal testosterone  . Adenomatous colon polyp 10/16/2011    Repeat 2018  . 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 pylori neg, no dysplasia or malignancy.  . Macular degeneration, dry     Mild, bilat (Optometrist, Mayford Knife at [employer] [facility] [place] in [place], [place])  . Iron deficiency anemia 2014    03/2012 capsule endoscopy showed 2 AVMs--likely responsible for his IDA--lifetime iron supp recommended + q63moCBCs.  . 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 COPD (chronic obstructive pulmonary disease)     Spirometry  2004 borderline obstruction  . Hyperlipidemia   . 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 Other and unspecified angina pectoris   . DOE (dyspnea on exertion)   . Pneumonia   . DM type 2 (diabetes mellitus, type 2)     Poor control on max oral meds--pt eventually  agreed to insulin therap  . CAD, multiple vessel     3V CAD cath 08/08/13----CABG done shortly after.  . Chronic renal insufficiency, stage II (mild)   . Diabetic nephropathy     Elevated urine microalb/cr 03/2011  . Hearing loss of both ears 2016    Hearing aids    Past Surgical History  Procedure Laterality Date  . Cholecystectomy open  1999  . Appendectomy  1957  . Hip surgery Right 1954    Repair of slipped capital femoral epiphysis.  . Patella fracture surgery Left ~ 1979    bolt + 3 screws to repair tib plateau fx  . Testicle surgery  as a child    Undescended testicle brought down into scrotum  . Tonsillectomy  1947  . Colonoscopy  10/16/2011    Procedure: COLONOSCOPY;  Surgeon: RInda Castle MD;  Location: [facility];  Service: Endoscopy;  Laterality: N/A;  . Cataract extraction w/ intraocular lens  implant, bilateral  04/08/2006 & 04/22/2006  . Esophagogastroduodenoscopy  02/25/12    Atrophic gastritis with a few erosions--capsule endoscopy planned as of 02/25/12 (Dr. KDeatra Ina.  . 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 Hardware removal Right  08/12/2012    Procedure: HARDWARE REMOVAL;  Surgeon: Mcarthur Rossetti, MD;  Location: [facility];  Service: Orthopedics;  Laterality: Right;  . Cardiac catheterization  08/08/2013  . Cataract extraction w/ intraocular lens  implant, bilateral Bilateral   . Coronary artery bypass graft N/A 08/14/2013    Procedure: CORONARY ARTERY BYPASS GRAFTING (CABG);  Surgeon: Melrose Nakayama, MD;  Location: [facility];  Service: Open Heart Surgery;  Laterality: N/A;  Times 4   using left internal mammary artery and endoscopically harvested bilateral saphenous vein  . Intraoperative transesophageal echocardiogram N/A 08/14/2013    Normal LV function. Procedure:  INTRAOPERATIVE TRANSESOPHAGEAL ECHOCARDIOGRAM;  Surgeon: Melrose Nakayama, MD;  Location: [facility];  Service: Open Heart Surgery;  Laterality: N/A;  . Pericardial tap N/A 07/01/2013    Procedure: PERICARDIAL TAP;  Surgeon: Jettie Booze, MD;  Location: [facility] CATH LAB;  Service: Cardiovascular;  Laterality: N/A;  . Left heart catheterization with coronary angiogram N/A 08/08/2013    Procedure: LEFT HEART CATHETERIZATION WITH CORONARY ANGIOGRAM;  Surgeon: Jettie Booze, MD;  Location: [facility] C[facility] LAB;  Service: Cardiovascular;  Laterality: N/A;    Outpatient Prescriptions Prior to Visit  Medication Sig Dispense Refill  . ACCU-CHEK AVIVA PLUS test strip USE TO CHECK BLOOD SUGAR 3 TIMES DAILY 100 each 11  . acetaminophen (TYLENOL) 325 MG tablet Take 2 tablets (650 mg total) by mouth every 4 (four) hours as needed for headache or mild pain.    [name] aspirin 81 MG tablet Take 81 mg by mouth daily.    . budesonide-formoterol (SYMBICORT) 160-4.5 MCG/ACT inhaler Inhale 2 puffs into the lungs 2 (two) times daily. 1 Inhaler 12  . buPROPion (WELLBUTRIN SR) 150 MG 12 hr tablet 1 tab po bid 180 tablet 1  . Cholecalciferol (VITAMIN D-3) 1000 UNITS CAPS Take 1,000 Units by mouth daily.     . Chromium Picolinate 1000 MCG TABS Take 1,000 mcg by mouth daily.     . furosemide (LASIX) 40 MG tablet 1/2-1 tab po qd prn as directed by MD 90 tablet 1  . isosorbide mononitrate (IMDUR) 30 MG 24 hr tablet Take 1 tablet (30 mg total) by mouth daily. 90 tablet 1  . lovastatin (MEVACOR) 40 MG tablet Take 1 tablet (40 mg total) by mouth 2 (two) times daily. 180 tablet 1  . metFORMIN (GLUCOPHAGE) 1000 MG tablet Take 1 tablet (1,000 mg total) by mouth 2 (two) times daily with a meal. 180 tablet 1  . Multiple Vitamin (MULTIVITAMIN WITH MINERALS) TABS Take 1 tablet by mouth daily.    . pioglitazone (ACTOS) 45 MG tablet Take 1 tablet (45 mg total) by mouth daily. 90 tablet 1  . ramipril (ALTACE) 10 MG capsule Take 1 capsule (10 mg  total) by mouth 2 (two) times daily. 180 capsule 1  . tamsulosin (FLOMAX) 0.4 MG CAPS capsule TAKE 1 CAPSULE BY MOUTH EVERY DAY AFTER SUPPER 90 capsule 1  . citalopram (CELEXA) 20 MG tablet TAKE ONE TABLET BY MOUTH EVERY DAY 30 tablet 6  . Insulin Detemir (LEVEMIR FLEXPEN) 100 UNIT/ML Pen Inject 45 Units into the skin at bedtime. 50 mL 1  . insulin regular (NOVOLIN R,HUMULIN R) 100 units/mL injection 10 U SQ q AC (take about 20 min prior to eating) 10 mL 11   No facility-administered medications prior to visit.    Allergies  Allergen Reactions  . Morphine And Related Other (See Comments)    Drenched with perspiration  . Demerol [Meperidine] Nausea Only  .  Starlix [Nateglinide] Other (See Comments)    gassy    ROS As per HPI  PE: Blood pressure 134/71, pulse 56, temperature 98.4 F (36.9 C), temperature source Oral, resp. rate 16, height 5' 11" (1.803 m), weight 246 lb (111.585 kg), SpO2 96 %. Gen: Alert, well appearing.  Patient is oriented to person, place, time, and situation. UE's with mild resting tremor with arms outstretched in front of him and with FNF bilat. No cogwheel rigidity, no mask-like facies, no shuffling gait. CV: RRR, distant S1 and S2.  No audible m/r/g. LUNGS: CTA bilat except trace early insp crackles in bases bilat. LLE: 2+ edema to just below knee.  RLE 2+ edema in ankle to about 1/3 way up ant tib region.  LABS:  Lab Results  Component Value Date   WBC 8.1 06/11/2014   HGB 13.4 06/11/2014   HCT 41.3 06/11/2014   MCV 83.2 06/11/2014   PLT 193.0 06/11/2014   Lab Results  Component Value Date   HGBA1C 10.8* 04/17/2014     Chemistry      Component Value Date/Time   NA 135 06/11/2014 0953   K 4.5 06/11/2014 0953   CL 103 06/11/2014 0953   CO2 24 06/11/2014 0953   BUN 26* 06/11/2014 0953   CREATININE 0.87 06/11/2014 0953      Component Value Date/Time   CALCIUM 9.2 06/11/2014 0953   ALKPHOS 89 10/12/2013 0847   AST 20 10/12/2013 0847   ALT  12 10/12/2013 0847   BILITOT 0.6 10/12/2013 0847     Lab Results  Component Value Date   CHOL 161 04/17/2014   HDL 47.60 04/17/2014   LDLCALC 63 07/04/2013   LDLDIRECT 77.0 04/17/2014   TRIG 218.0* 04/17/2014   CHOLHDL 3 04/17/2014   Lab Results  Component Value Date   TSH 0.918 07/01/2013   IMPRESSION AND PLAN:  1) DM 2, too much hypoglycemia, esp with recent change from novolog to relion regular insulin at mealtimes.  Will stick with 25 U levemir hs and decrease to 8 U relion prior to each meal. He is UTD on all diabetic monitoring at this time except HbA1c, which we'll get today.  2) HTN; The current medical regimen is effective;  continue present plan and medications.  3) Depression: still struggling.   Will increase citalopram to 49m qd and continue wellbrutrin 1518mbid. His tremulousness is mild--likely due to mild serotonin excess.  Will monitor/watchful waiting.  An After Visit Summary was printed and given to the patient.   FOLLOW UP: Return in about 6 weeks (around 09/03/2014) for f/u DM 2 and depression.

## 2014-07-23 NOTE — Progress Notes (Signed)
Pre visit review using our clinic review tool, if applicable. No additional management support is needed unless otherwise documented below in the visit note.

## 2014-09-04 ENCOUNTER — Ambulatory Visit: Payer: Medicare Other | Admitting: Family Medicine

## 2014-09-07 ENCOUNTER — Encounter: Payer: Self-pay | Admitting: Family Medicine

## 2014-09-07 ENCOUNTER — Ambulatory Visit (INDEPENDENT_AMBULATORY_CARE_PROVIDER_SITE_OTHER): Payer: Medicare Other | Admitting: Family Medicine

## 2014-09-07 VITALS — BP 145/68 | HR 73 | Temp 98.3°F | Resp 16 | Ht 71.0 in | Wt 248.0 lb

## 2014-09-07 DIAGNOSIS — I10 Essential (primary) hypertension: Secondary | ICD-10-CM | POA: Diagnosis not present

## 2014-09-07 DIAGNOSIS — F329 Major depressive disorder, single episode, unspecified: Secondary | ICD-10-CM | POA: Diagnosis not present

## 2014-09-07 DIAGNOSIS — E118 Type 2 diabetes mellitus with unspecified complications: Secondary | ICD-10-CM

## 2014-09-07 DIAGNOSIS — F32A Depression, unspecified: Secondary | ICD-10-CM

## 2014-09-07 NOTE — Progress Notes (Signed)
Pre visit review using our clinic review tool, if applicable. No additional management support is needed unless otherwise documented below in the visit note.

## 2014-09-07 NOTE — Progress Notes (Signed)
OFFICE VISIT  09/07/2014   CC:  Chief Complaint  Patient presents with  . Follow-up    Pt is not fasting.    HPI:    Patient is a 74 y.o. Caucasian male who presents for 6 wk f/u DM 2 with nephropathy, HTN, and depression.  DM: decreased mealtime insulin last visit due to some hypoglycemia. He has also decreased his levemir to 22 U qhs and this has helped his tendency towards morning hypoglycemia.   Depression: increased citalopram to 22m qd last visit, continued wellbutrin xl 1523mqd.   Doesn't seem as irritable.  Still feels depression as his primary mood about 1/2 of his days.  Some anhedonia.  Some withdrawal tendency at home.  No side effects from his antidepressants.  HTN: 13527vg systolic, low 8078Eiastolic.  No new c/o today. No CP, SOB, palpitations.  Past Medical History  Diagnosis Date  . Hypertension   . Tobacco dependence in remission     100+ pack-yr hx: quit after CABG  . Obesity   . Hyperplastic colon polyp 2001  . Erectile dysfunction     Normal testosterone  . Adenomatous colon polyp 10/16/2011    Repeat 2018  . 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 pylori neg, no dysplasia or malignancy.  . Macular degeneration, dry     Mild, bilat (Optometrist, [facility] [place] . Iron deficiency anemia 2014    03/2012 capsule endoscopy showed 2 AVMs--likely responsible for his IDA--lifetime iron supp recommended + q6m11moCs.  . 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 COPD (chronic obstructive pulmonary disease)     Spirometry  2004 borderline obstruction  . Hyperlipidemia   . 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 Other and unspecified angina pectoris   . DOE (dyspnea on exertion)   . Pneumonia   . DM type 2 (diabetes mellitus, type 2)     Poor control on max oral meds--pt eventually agreed to insulin therap   . CAD, multiple vessel     3V CAD cath 08/08/13----CABG done shortly after.  . Chronic renal insufficiency, stage II (mild)   . Diabetic nephropathy     Elevated urine microalb/cr 03/2011  . Hearing loss of both ears 2016    Hearing aids    Past Surgical History  Procedure Laterality Date  . Cholecystectomy open  1999  . Appendectomy  1957  . Hip surgery Right 1954    Repair of slipped capital femoral epiphysis.  . Patella fracture surgery Left ~ 1979    bolt + 3 screws to repair tib plateau fx  . Testicle surgery  as a child    Undescended testicle brought down into scrotum  . Tonsillectomy  1947  . Colonoscopy  10/16/2011    Procedure: COLONOSCOPY;  Surgeon: RobInda CastleD;  [facility];  Service: Endoscopy;  Laterality: N/A;  . Cataract extraction w/ intraocular lens  implant, bilateral  04/08/2006 & 04/22/2006  . Esophagogastroduodenoscopy  02/25/12    Atrophic gastritis with a few erosions--capsule endoscopy planned as of 02/25/12 (Dr. KapDeatra Ina . Total hip arthroplasty Right 08/12/2012    Procedure: REMOVAL OF OLD PINS RIGHT HIP AND RIGHT TOTAL HIP ARTHROPLASTY ANTERIOR APPROACH;  Surgeon: ChrMcarthur RossettiD;  [facility];  Service: Orthopedics;  Laterality: Right;  . Hardware removal Right  08/12/2012    Procedure: HARDWARE REMOVAL;  Surgeon: Mcarthur Rossetti, MD;  Location: [facility];  Service: Orthopedics;  Laterality: Right;  . Cardiac catheterization  08/08/2013  . Cataract extraction w/ intraocular lens  implant, bilateral Bilateral   . Coronary artery bypass graft N/A 08/14/2013    Procedure: CORONARY ARTERY BYPASS GRAFTING (CABG);  Surgeon: Melrose Nakayama, MD;  Location: [facility];  Service: Open Heart Surgery;  Laterality: N/A;  Times 4   using left internal mammary artery and endoscopically harvested bilateral saphenous vein  . Intraoperative transesophageal echocardiogram N/A 08/14/2013    Normal LV function. Procedure: INTRAOPERATIVE  TRANSESOPHAGEAL ECHOCARDIOGRAM;  Surgeon: Melrose Nakayama, MD;  Location: [facility];  Service: Open Heart Surgery;  Laterality: N/A;  . Pericardial tap N/A 07/01/2013    Procedure: PERICARDIAL TAP;  Surgeon: Jettie Booze, MD;  Location: [facility] CATH LAB;  Service: Cardiovascular;  Laterality: N/A;  . Left heart catheterization with coronary angiogram N/A 08/08/2013    Procedure: LEFT HEART CATHETERIZATION WITH CORONARY ANGIOGRAM;  Surgeon: Jettie Booze, MD;  Location: [facility] CATH LAB;  Service: Cardiovascular;  Laterality: N/A;    Outpatient Prescriptions Prior to Visit  Medication Sig Dispense Refill  . ACCU-CHEK AVIVA PLUS test strip USE TO CHECK BLOOD SUGAR 3 TIMES DAILY 100 each 11  . acetaminophen (TYLENOL) 325 MG tablet Take 2 tablets (650 mg total) by mouth every 4 (four) hours as needed for headache or mild pain.    [name] aspirin 81 MG tablet Take 81 mg by mouth daily.    . budesonide-formoterol (SYMBICORT) 160-4.5 MCG/ACT inhaler Inhale 2 puffs into the lungs 2 (two) times daily. 1 Inhaler 12  . buPROPion (WELLBUTRIN SR) 150 MG 12 hr tablet 1 tab po bid 180 tablet 1  . Cholecalciferol (VITAMIN D-3) 1000 UNITS CAPS Take 1,000 Units by mouth daily.     . Chromium Picolinate 1000 MCG TABS Take 1,000 mcg by mouth daily.     . citalopram (CELEXA) 40 MG tablet Take 1 tablet (40 mg total) by mouth daily. 90 tablet 3  . furosemide (LASIX) 40 MG tablet 1/2-1 tab po qd prn as directed by MD 90 tablet 1  . Insulin Detemir (LEVEMIR FLEXPEN) 100 UNIT/ML Pen Inject 45 Units into the skin at bedtime. 25 U SQ qhs 50 mL 1  . insulin regular (NOVOLIN R,HUMULIN R) 100 units/mL injection 8 U SQ q AC (take about 20 min prior to eating) 10 mL 11  . isosorbide mononitrate (IMDUR) 30 MG 24 hr tablet Take 1 tablet (30 mg total) by mouth daily. 90 tablet 1  . lovastatin (MEVACOR) 40 MG tablet Take 1 tablet (40 mg total) by mouth 2 (two) times daily. 180 tablet 1  . metFORMIN (GLUCOPHAGE) 1000 MG tablet Take 1  tablet (1,000 mg total) by mouth 2 (two) times daily with a meal. 180 tablet 1  . Multiple Vitamin (MULTIVITAMIN WITH MINERALS) TABS Take 1 tablet by mouth daily.    . pioglitazone (ACTOS) 45 MG tablet Take 1 tablet (45 mg total) by mouth daily. 90 tablet 1  . ramipril (ALTACE) 10 MG capsule Take 1 capsule (10 mg total) by mouth 2 (two) times daily. 180 capsule 1  . tamsulosin (FLOMAX) 0.4 MG CAPS capsule TAKE 1 CAPSULE BY MOUTH EVERY DAY AFTER SUPPER 90 capsule 1   No facility-administered medications prior to visit.    Allergies  Allergen Reactions  . Morphine And Related Other (See Comments)    Drenched with perspiration  .  Demerol [Meperidine] Nausea Only  . Starlix [Nateglinide] Other (See Comments)    gassy    ROS As per HPI  PE: Blood pressure 145/68, pulse 73, temperature 98.3 F (36.8 C), temperature source Oral, resp. rate 16, height 5' 11" (1.803 m), weight 248 lb (112.492 kg), SpO2 94 %. Gen: Alert, well appearing.  Patient is oriented to person, place, time, and situation. AFFECT: pleasant, lucid thought and speech.   LABS:  Lab Results  Component Value Date   HGBA1C 7.3* 07/23/2014     Chemistry      Component Value Date/Time   NA 135 06/11/2014 0953   K 4.5 06/11/2014 0953   CL 103 06/11/2014 0953   CO2 24 06/11/2014 0953   BUN 26* 06/11/2014 0953   CREATININE 0.87 06/11/2014 0953      Component Value Date/Time   CALCIUM 9.2 06/11/2014 0953   ALKPHOS 89 10/12/2013 0847   AST 20 10/12/2013 0847   ALT 12 10/12/2013 0847   BILITOT 0.6 10/12/2013 0847     Lab Results  Component Value Date   CHOL 161 04/17/2014   HDL 47.60 04/17/2014   LDLCALC 63 07/04/2013   LDLDIRECT 77.0 04/17/2014   TRIG 218.0* 04/17/2014   CHOLHDL 3 04/17/2014   Lab Results  Component Value Date   TSH 0.918 07/01/2013   IMPRESSION AND PLAN:  1) DM 2; improved, having less hypoglycemia on current regimen of 22 U levemir qhs and 8 U relion qAC. HbA1c next f/u  visit.  2) HTN; The current medical regimen is effective;  continue present plan and medications.  3) Depression: only slight improvement over the last 6 wks on increased dose of citalopram. Discussed options today and he has decided to just give this more time for now.  An After Visit Summary was printed and given to the patient.  FOLLOW UP: Return in about 2 months (around 11/07/2014) for routine chronic illness f/u.

## 2014-10-06 DIAGNOSIS — I480 Paroxysmal atrial fibrillation: Secondary | ICD-10-CM

## 2014-10-06 HISTORY — DX: Paroxysmal atrial fibrillation: I48.0

## 2014-10-29 ENCOUNTER — Other Ambulatory Visit: Payer: Self-pay

## 2014-10-29 ENCOUNTER — Ambulatory Visit (HOSPITAL_BASED_OUTPATIENT_CLINIC_OR_DEPARTMENT_OTHER)
Admission: RE | Admit: 2014-10-29 | Discharge: 2014-10-29 | Disposition: A | Discharge status: Home / Self Care | Payer: Medicare Other | Source: Ambulatory Visit | Attending: Family Medicine | Admitting: Family Medicine

## 2014-10-29 ENCOUNTER — Inpatient Hospital Stay (HOSPITAL_BASED_OUTPATIENT_CLINIC_OR_DEPARTMENT_OTHER)
Admission: EM | Admit: 2014-10-29 | Discharge: 2014-11-02 | DRG: 189 | Disposition: A | Discharge status: Home / Self Care | Payer: Medicare Other | Attending: Internal Medicine | Admitting: Internal Medicine

## 2014-10-29 ENCOUNTER — Encounter (HOSPITAL_BASED_OUTPATIENT_CLINIC_OR_DEPARTMENT_OTHER): Payer: Self-pay | Admitting: *Deleted

## 2014-10-29 ENCOUNTER — Encounter: Payer: Self-pay | Admitting: Family Medicine

## 2014-10-29 ENCOUNTER — Ambulatory Visit (INDEPENDENT_AMBULATORY_CARE_PROVIDER_SITE_OTHER): Payer: Medicare Other | Admitting: Family Medicine

## 2014-10-29 VITALS — BP 142/62 | HR 78 | Temp 98.4°F | Resp 24 | Wt 215.5 lb

## 2014-10-29 DIAGNOSIS — Z888 Allergy status to other drugs, medicaments and biological substances status: Secondary | ICD-10-CM | POA: Diagnosis not present

## 2014-10-29 DIAGNOSIS — Z86010 Personal history of colonic polyps: Secondary | ICD-10-CM

## 2014-10-29 DIAGNOSIS — J441 Chronic obstructive pulmonary disease with (acute) exacerbation: Secondary | ICD-10-CM | POA: Diagnosis present

## 2014-10-29 DIAGNOSIS — I313 Pericardial effusion (noninflammatory): Secondary | ICD-10-CM | POA: Diagnosis present

## 2014-10-29 DIAGNOSIS — F17210 Nicotine dependence, cigarettes, uncomplicated: Secondary | ICD-10-CM | POA: Diagnosis present

## 2014-10-29 DIAGNOSIS — I314 Cardiac tamponade: Secondary | ICD-10-CM | POA: Diagnosis present

## 2014-10-29 DIAGNOSIS — Z7984 Long term (current) use of oral hypoglycemic drugs: Secondary | ICD-10-CM

## 2014-10-29 DIAGNOSIS — N182 Chronic kidney disease, stage 2 (mild): Secondary | ICD-10-CM | POA: Diagnosis not present

## 2014-10-29 DIAGNOSIS — E785 Hyperlipidemia, unspecified: Secondary | ICD-10-CM | POA: Diagnosis present

## 2014-10-29 DIAGNOSIS — E1121 Type 2 diabetes mellitus with diabetic nephropathy: Secondary | ICD-10-CM | POA: Diagnosis present

## 2014-10-29 DIAGNOSIS — H3530 Unspecified macular degeneration: Secondary | ICD-10-CM | POA: Diagnosis not present

## 2014-10-29 DIAGNOSIS — Z8709 Personal history of other diseases of the respiratory system: Secondary | ICD-10-CM

## 2014-10-29 DIAGNOSIS — E1122 Type 2 diabetes mellitus with diabetic chronic kidney disease: Secondary | ICD-10-CM | POA: Diagnosis present

## 2014-10-29 DIAGNOSIS — I480 Paroxysmal atrial fibrillation: Secondary | ICD-10-CM | POA: Diagnosis present

## 2014-10-29 DIAGNOSIS — E118 Type 2 diabetes mellitus with unspecified complications: Secondary | ICD-10-CM | POA: Diagnosis not present

## 2014-10-29 DIAGNOSIS — Z885 Allergy status to narcotic agent status: Secondary | ICD-10-CM | POA: Diagnosis not present

## 2014-10-29 DIAGNOSIS — R0602 Shortness of breath: Secondary | ICD-10-CM

## 2014-10-29 DIAGNOSIS — I3139 Other pericardial effusion (noninflammatory): Secondary | ICD-10-CM | POA: Diagnosis present

## 2014-10-29 DIAGNOSIS — R059 Cough, unspecified: Secondary | ICD-10-CM

## 2014-10-29 DIAGNOSIS — R0600 Dyspnea, unspecified: Secondary | ICD-10-CM | POA: Insufficient documentation

## 2014-10-29 DIAGNOSIS — I129 Hypertensive chronic kidney disease with stage 1 through stage 4 chronic kidney disease, or unspecified chronic kidney disease: Secondary | ICD-10-CM | POA: Diagnosis not present

## 2014-10-29 DIAGNOSIS — J9620 Acute and chronic respiratory failure, unspecified whether with hypoxia or hypercapnia: Secondary | ICD-10-CM | POA: Diagnosis present

## 2014-10-29 DIAGNOSIS — I2510 Atherosclerotic heart disease of native coronary artery without angina pectoris: Secondary | ICD-10-CM | POA: Diagnosis not present

## 2014-10-29 DIAGNOSIS — M13862 Other specified arthritis, left knee: Secondary | ICD-10-CM | POA: Diagnosis present

## 2014-10-29 DIAGNOSIS — I10 Essential (primary) hypertension: Secondary | ICD-10-CM | POA: Diagnosis present

## 2014-10-29 DIAGNOSIS — J9621 Acute and chronic respiratory failure with hypoxia: Principal | ICD-10-CM | POA: Diagnosis present

## 2014-10-29 DIAGNOSIS — Z96641 Presence of right artificial hip joint: Secondary | ICD-10-CM | POA: Diagnosis present

## 2014-10-29 DIAGNOSIS — I5033 Acute on chronic diastolic (congestive) heart failure: Secondary | ICD-10-CM | POA: Diagnosis present

## 2014-10-29 DIAGNOSIS — D509 Iron deficiency anemia, unspecified: Secondary | ICD-10-CM | POA: Diagnosis not present

## 2014-10-29 DIAGNOSIS — Z951 Presence of aortocoronary bypass graft: Secondary | ICD-10-CM | POA: Diagnosis not present

## 2014-10-29 DIAGNOSIS — M13851 Other specified arthritis, right hip: Secondary | ICD-10-CM | POA: Diagnosis not present

## 2014-10-29 DIAGNOSIS — R0902 Hypoxemia: Secondary | ICD-10-CM | POA: Diagnosis not present

## 2014-10-29 DIAGNOSIS — R7981 Abnormal blood-gas level: Secondary | ICD-10-CM | POA: Insufficient documentation

## 2014-10-29 DIAGNOSIS — Z6834 Body mass index (BMI) 34.0-34.9, adult: Secondary | ICD-10-CM

## 2014-10-29 DIAGNOSIS — R05 Cough: Secondary | ICD-10-CM

## 2014-10-29 DIAGNOSIS — J449 Chronic obstructive pulmonary disease, unspecified: Secondary | ICD-10-CM | POA: Diagnosis present

## 2014-10-29 DIAGNOSIS — E669 Obesity, unspecified: Secondary | ICD-10-CM | POA: Diagnosis present

## 2014-10-29 DIAGNOSIS — Z886 Allergy status to analgesic agent status: Secondary | ICD-10-CM | POA: Diagnosis not present

## 2014-10-29 DIAGNOSIS — J9611 Chronic respiratory failure with hypoxia: Secondary | ICD-10-CM | POA: Diagnosis present

## 2014-10-29 DIAGNOSIS — I428 Other cardiomyopathies: Secondary | ICD-10-CM | POA: Diagnosis not present

## 2014-10-29 DIAGNOSIS — M13861 Other specified arthritis, right knee: Secondary | ICD-10-CM | POA: Diagnosis present

## 2014-10-29 DIAGNOSIS — M13852 Other specified arthritis, left hip: Secondary | ICD-10-CM | POA: Diagnosis not present

## 2014-10-29 DIAGNOSIS — J42 Unspecified chronic bronchitis: Secondary | ICD-10-CM | POA: Diagnosis not present

## 2014-10-29 DIAGNOSIS — I509 Heart failure, unspecified: Secondary | ICD-10-CM

## 2014-10-29 DIAGNOSIS — Z77090 Contact with and (suspected) exposure to asbestos: Secondary | ICD-10-CM | POA: Diagnosis present

## 2014-10-29 LAB — CBC
HCT: 37.4 % — ABNORMAL LOW (ref 39.0–52.0)
HEMATOCRIT: 37.4 % — AB (ref 39.0–52.0)
HEMOGLOBIN: 11.4 g/dL — AB (ref 13.0–17.0)
Hemoglobin: 11.3 g/dL — ABNORMAL LOW (ref 13.0–17.0)
MCH: 26.1 pg (ref 26.0–34.0)
MCH: 25.8 pg — AB (ref 26.0–34.0)
MCHC: 30.5 g/dL (ref 30.0–36.0)
MCHC: 30.2 g/dL (ref 30.0–36.0)
MCV: 85.8 fL (ref 78.0–100.0)
MCV: 85.4 fL (ref 78.0–100.0)
PLATELETS: 151 10*3/uL (ref 150–400)
Platelets: 169 10*3/uL (ref 150–400)
RBC: 4.36 MIL/uL (ref 4.22–5.81)
RBC: 4.38 MIL/uL (ref 4.22–5.81)
RDW: 15.4 % (ref 11.5–15.5)
RDW: 15.5 % (ref 11.5–15.5)
WBC: 7.5 10*3/uL (ref 4.0–10.5)
WBC: 7.8 10*3/uL (ref 4.0–10.5)

## 2014-10-29 LAB — COMPREHENSIVE METABOLIC PANEL
ALBUMIN: 3.4 g/dL — AB (ref 3.5–5.0)
ALK PHOS: 63 U/L (ref 38–126)
ALT: 14 U/L — ABNORMAL LOW (ref 17–63)
ANION GAP: 5 (ref 5–15)
AST: 20 U/L (ref 15–41)
BUN: 18 mg/dL (ref 6–20)
CO2: 26 mmol/L (ref 22–32)
Calcium: 8.5 mg/dL — ABNORMAL LOW (ref 8.9–10.3)
Chloride: 108 mmol/L (ref 101–111)
Creatinine, Ser: 0.96 mg/dL (ref 0.61–1.24)
GFR calc Af Amer: >60 mL/min (ref >60)
GFR calc non Af Amer: >60 mL/min (ref >60)
GLUCOSE: 178 mg/dL — AB (ref 65–99)
POTASSIUM: 4.2 mmol/L (ref 3.5–5.1)
SODIUM: 139 mmol/L (ref 135–145)
Total Bilirubin: 0.5 mg/dL (ref 0.3–1.2)
Total Protein: 6.3 g/dL — ABNORMAL LOW (ref 6.5–8.1)

## 2014-10-29 LAB — TROPONIN I: Troponin I: <0.03 ng/mL (ref <0.031)

## 2014-10-29 LAB — GLUCOSE, CAPILLARY: GLUCOSE-CAPILLARY: 382 mg/dL — AB (ref 65–99)

## 2014-10-29 LAB — CREATININE, SERUM: CREATININE: 1.14 mg/dL (ref 0.61–1.24)

## 2014-10-29 LAB — BRAIN NATRIURETIC PEPTIDE: B Natriuretic Peptide: 402 pg/mL — ABNORMAL HIGH (ref 0.0–100.0)

## 2014-10-29 MED ORDER — ALUM & MAG HYDROXIDE-SIMETH 200-200-20 MG/5ML PO SUSP: 30.0000 mL | Freq: Four times a day (QID) | ORAL | Status: DC | PRN

## 2014-10-29 MED ORDER — CHROMIUM PICOLINATE 1000 MCG PO TABS: 1000.0000 ug | ORAL_TABLET | Freq: Every day | ORAL | Status: DC

## 2014-10-29 MED ORDER — POLYETHYLENE GLYCOL 3350 17 G PO PACK: 17.0000 g | PACK | Freq: Every day | ORAL | Status: DC | PRN

## 2014-10-29 MED ORDER — INSULIN ASPART 100 UNIT/ML ~~LOC~~ SOLN
0.0000 [IU] | Freq: Every day | SUBCUTANEOUS | Status: DC
  Administered 2014-10-29: 5 [IU] via SUBCUTANEOUS

## 2014-10-29 MED ORDER — MAGNESIUM SULFATE 2 GM/50ML IV SOLN
2.0000 g | Freq: Once | INTRAVENOUS | Status: AC
  Administered 2014-10-29: 2 g via INTRAVENOUS
  Filled 2014-10-29: qty 50

## 2014-10-29 MED ORDER — ACETAMINOPHEN 650 MG RE SUPP: 650.0000 mg | Freq: Four times a day (QID) | RECTAL | Status: DC | PRN

## 2014-10-29 MED ORDER — ALBUTEROL (5 MG/ML) CONTINUOUS INHALATION SOLN
15.0000 mg | INHALATION_SOLUTION | Freq: Once | RESPIRATORY_TRACT | Status: AC
  Administered 2014-10-29: 15 mg via RESPIRATORY_TRACT
  Filled 2014-10-29: qty 20

## 2014-10-29 MED ORDER — FUROSEMIDE 10 MG/ML IJ SOLN
40.0000 mg | Freq: Once | INTRAMUSCULAR | Status: AC
  Administered 2014-10-29: 40 mg via INTRAVENOUS
  Filled 2014-10-29: qty 4

## 2014-10-29 MED ORDER — PRAVASTATIN SODIUM 20 MG PO TABS
20.0000 mg | ORAL_TABLET | Freq: Every day | ORAL | Status: DC
  Administered 2014-10-30 – 2014-11-01 (×3): 20 mg via ORAL
  Filled 2014-10-29 (×3): qty 1

## 2014-10-29 MED ORDER — SODIUM CHLORIDE 0.9 % IJ SOLN
3.0000 mL | Freq: Two times a day (BID) | INTRAMUSCULAR | Status: DC
  Administered 2014-10-29 – 2014-11-02 (×7): 3 mL via INTRAVENOUS

## 2014-10-29 MED ORDER — INSULIN DETEMIR 100 UNIT/ML ~~LOC~~ SOLN
25.0000 [IU] | Freq: Every day | SUBCUTANEOUS | Status: DC
  Administered 2014-10-29: 25 [IU] via SUBCUTANEOUS
  Filled 2014-10-29 (×3): qty 0.25

## 2014-10-29 MED ORDER — ADULT MULTIVITAMIN W/MINERALS CH
1.0000 | ORAL_TABLET | Freq: Every day | ORAL | Status: DC
  Administered 2014-10-29 – 2014-11-02 (×5): 1 via ORAL
  Filled 2014-10-29 (×5): qty 1

## 2014-10-29 MED ORDER — GUAIFENESIN-DM 100-10 MG/5ML PO SYRP: 5.0000 mL | ORAL_SOLUTION | ORAL | Status: DC | PRN

## 2014-10-29 MED ORDER — PIOGLITAZONE HCL 45 MG PO TABS
45.0000 mg | ORAL_TABLET | Freq: Every day | ORAL | Status: DC
  Administered 2014-10-30 – 2014-10-31 (×2): 45 mg via ORAL
  Filled 2014-10-29 (×3): qty 1

## 2014-10-29 MED ORDER — ONDANSETRON HCL 4 MG/2ML IJ SOLN: 4.0000 mg | Freq: Four times a day (QID) | INTRAMUSCULAR | Status: DC | PRN

## 2014-10-29 MED ORDER — ACETAMINOPHEN 325 MG PO TABS: 650.0000 mg | ORAL_TABLET | Freq: Four times a day (QID) | ORAL | Status: DC | PRN

## 2014-10-29 MED ORDER — RAMIPRIL 10 MG PO CAPS
10.0000 mg | ORAL_CAPSULE | Freq: Two times a day (BID) | ORAL | Status: DC
  Administered 2014-10-29 – 2014-10-30 (×2): 10 mg via ORAL
  Filled 2014-10-29 (×3): qty 1

## 2014-10-29 MED ORDER — BUDESONIDE-FORMOTEROL FUMARATE 160-4.5 MCG/ACT IN AERO
2.0000 | INHALATION_SPRAY | Freq: Two times a day (BID) | RESPIRATORY_TRACT | Status: DC
  Administered 2014-10-29 – 2014-11-01 (×7): 2 via RESPIRATORY_TRACT
  Filled 2014-10-29: qty 6

## 2014-10-29 MED ORDER — ONDANSETRON HCL 4 MG PO TABS: 4.0000 mg | ORAL_TABLET | Freq: Four times a day (QID) | ORAL | Status: DC | PRN

## 2014-10-29 MED ORDER — METHYLPREDNISOLONE SODIUM SUCC 125 MG IJ SOLR
60.0000 mg | Freq: Three times a day (TID) | INTRAMUSCULAR | Status: DC
  Administered 2014-10-29 – 2014-10-31 (×8): 60 mg via INTRAVENOUS
  Filled 2014-10-29 (×8): qty 2

## 2014-10-29 MED ORDER — ALBUTEROL SULFATE (2.5 MG/3ML) 0.083% IN NEBU
2.5000 mg | INHALATION_SOLUTION | Freq: Once | RESPIRATORY_TRACT | Status: AC
  Administered 2014-10-29: 2.5 mg via RESPIRATORY_TRACT
  Filled 2014-10-29: qty 3

## 2014-10-29 MED ORDER — INSULIN ASPART 100 UNIT/ML ~~LOC~~ SOLN
0.0000 [IU] | Freq: Three times a day (TID) | SUBCUTANEOUS | Status: DC
  Administered 2014-10-30: 11 [IU] via SUBCUTANEOUS

## 2014-10-29 MED ORDER — ALBUTEROL (5 MG/ML) CONTINUOUS INHALATION SOLN
10.0000 mg/h | INHALATION_SOLUTION | Freq: Once | RESPIRATORY_TRACT | Status: AC
  Administered 2014-10-29: 10 mg/h via RESPIRATORY_TRACT

## 2014-10-29 MED ORDER — ISOSORBIDE MONONITRATE ER 30 MG PO TB24
30.0000 mg | ORAL_TABLET | Freq: Every day | ORAL | Status: DC
  Administered 2014-10-30 – 2014-11-02 (×4): 30 mg via ORAL
  Filled 2014-10-29 (×4): qty 1

## 2014-10-29 MED ORDER — BUPROPION HCL ER (SR) 150 MG PO TB12
150.0000 mg | ORAL_TABLET | Freq: Two times a day (BID) | ORAL | Status: DC
  Administered 2014-10-29 – 2014-11-02 (×8): 150 mg via ORAL
  Filled 2014-10-29 (×8): qty 1

## 2014-10-29 MED ORDER — PRAVASTATIN SODIUM 20 MG PO TABS: 20.0000 mg | ORAL_TABLET | Freq: Every day | ORAL | Status: DC

## 2014-10-29 MED ORDER — DOXYCYCLINE HYCLATE 100 MG PO TABS
100.0000 mg | ORAL_TABLET | Freq: Once | ORAL | Status: AC
  Administered 2014-10-29: 100 mg via ORAL
  Filled 2014-10-29: qty 1

## 2014-10-29 MED ORDER — ENOXAPARIN SODIUM 40 MG/0.4ML ~~LOC~~ SOLN: 40.0000 mg | SUBCUTANEOUS | Status: DC

## 2014-10-29 MED ORDER — PREDNISONE 20 MG PO TABS
40.0000 mg | ORAL_TABLET | Freq: Once | ORAL | Status: AC
  Administered 2014-10-29: 40 mg via ORAL
  Filled 2014-10-29: qty 2

## 2014-10-29 MED ORDER — INSULIN DETEMIR 100 UNIT/ML ~~LOC~~ SOLN
45.0000 [IU] | Freq: Every day | SUBCUTANEOUS | Status: DC
  Filled 2014-10-29: qty 0.45

## 2014-10-29 MED ORDER — IPRATROPIUM-ALBUTEROL 0.5-2.5 (3) MG/3ML IN SOLN
3.0000 mL | Freq: Once | RESPIRATORY_TRACT | Status: AC
  Administered 2014-10-29: 3 mL via RESPIRATORY_TRACT

## 2014-10-29 MED ORDER — METOPROLOL TARTRATE 25 MG PO TABS
25.0000 mg | ORAL_TABLET | Freq: Two times a day (BID) | ORAL | Status: DC
  Administered 2014-10-29 – 2014-10-30 (×2): 25 mg via ORAL
  Filled 2014-10-29 (×2): qty 1

## 2014-10-29 MED ORDER — VITAMIN D3 25 MCG (1000 UNIT) PO TABS
1000.0000 [IU] | ORAL_TABLET | Freq: Every day | ORAL | Status: DC
  Administered 2014-10-29 – 2014-11-02 (×5): 1000 [IU] via ORAL
  Filled 2014-10-29 (×10): qty 1

## 2014-10-29 MED ORDER — CETYLPYRIDINIUM CHLORIDE 0.05 % MT LIQD
7.0000 mL | Freq: Two times a day (BID) | OROMUCOSAL | Status: DC
  Administered 2014-10-29 – 2014-11-02 (×7): 7 mL via OROMUCOSAL

## 2014-10-29 MED ORDER — CITALOPRAM HYDROBROMIDE 20 MG PO TABS
40.0000 mg | ORAL_TABLET | Freq: Every day | ORAL | Status: DC
  Administered 2014-10-30 – 2014-11-02 (×4): 40 mg via ORAL
  Filled 2014-10-29 (×4): qty 2

## 2014-10-29 MED ORDER — ASPIRIN EC 81 MG PO TBEC
81.0000 mg | DELAYED_RELEASE_TABLET | Freq: Every day | ORAL | Status: DC
  Administered 2014-10-29 – 2014-11-02 (×5): 81 mg via ORAL
  Filled 2014-10-29 (×5): qty 1

## 2014-10-29 MED ORDER — TAMSULOSIN HCL 0.4 MG PO CAPS
0.4000 mg | ORAL_CAPSULE | Freq: Every day | ORAL | Status: DC
  Administered 2014-10-29 – 2014-11-01 (×4): 0.4 mg via ORAL
  Filled 2014-10-29 (×4): qty 1

## 2014-10-29 MED ORDER — IPRATROPIUM-ALBUTEROL 0.5-2.5 (3) MG/3ML IN SOLN
3.0000 mL | Freq: Once | RESPIRATORY_TRACT | Status: AC
  Administered 2014-10-29: 3 mL via RESPIRATORY_TRACT
  Filled 2014-10-29: qty 3

## 2014-10-29 MED ORDER — DOXYCYCLINE HYCLATE 100 MG PO TABS
100.0000 mg | ORAL_TABLET | Freq: Two times a day (BID) | ORAL | Status: DC
  Administered 2014-10-30 – 2014-10-31 (×3): 100 mg via ORAL
  Filled 2014-10-29 (×3): qty 1

## 2014-10-29 MED ORDER — INFLUENZA VAC SPLIT QUAD 0.5 ML IM SUSY
0.5000 mL | PREFILLED_SYRINGE | INTRAMUSCULAR | Status: AC
  Administered 2014-10-30: 0.5 mL via INTRAMUSCULAR
  Filled 2014-10-29: qty 0.5

## 2014-10-29 NOTE — Patient Instructions (Signed)
I am not certain if you are experiencing a COPD exacerbation or Pneumonia. Your oxygen saturation is too low to send you home. Please go to ED for full evaluation.

## 2014-10-29 NOTE — ED Notes (Signed)
Report given to [employer] on [place].

## 2014-10-29 NOTE — ED Notes (Signed)
97m Albuterol CAT complete

## 2014-10-29 NOTE — Progress Notes (Signed)
Subjective:    Patient ID: Joshua [name], male    DOB: [birth year], 74 y.o.   MRN: [id number]  HPI  Shortness of breath: Patient presents with at least 4-5 day history of shortness of breath, dyspnea and coughing. Patient states he feels that he needs to cough up phlegm, but is unable to do so. Patient feels that his symptoms became worse after being outside over the weekend and the cold wind. Patient denies any chest pain, fever, chills, nausea or vomit. He states he is eating and drinking well. Patient has a history of COPD, and is on Symbicort daily. He does not have an albuterol inhaler at home. Patient is not on oxygen at home. Per prior visits, oxygen saturation is approximately 94%. Patient does state he is on 20 mg of Lasix daily, and has taken that today. He does feel like he has more swelling than usual. He is uncertain what his dry weight is, but notably is 215 pounds today which is 33 pounds lighter than his weight at last visit 6 weeks ago. Patient's last ejection fraction was approximately 55% one year ago. Patient has had a history of pleural effusions, hospitalization and emphysema/asbestosis  related pleural disease on CT 2014, with questionable pulmonary embolism at that time. There was also a 2.5 similar groundglass nodule at the right apex, with concern of infectious and inflammatory and a CT was recommended in 3 months for follow-up, uncertain if this has been completed. Patient is a former smoker. Past Medical History  Diagnosis Date  . Hypertension   . Tobacco dependence in remission     100+ pack-yr hx: quit after CABG  . Obesity   . Hyperplastic colon polyp 2001  . Erectile dysfunction     Normal testosterone  . Adenomatous colon polyp 10/16/2011    Repeat 2018  . 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 pylori neg, no dysplasia or malignancy.  . Macular degeneration, dry     Mild, bilat (Optometrist, Mayford Knife at [employer] [facility] [place] in [place], [place])  . Iron deficiency anemia 2014    03/2012 capsule endoscopy showed 2 AVMs--likely responsible for his IDA--lifetime iron supp recommended + q45moCBCs.  . 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 COPD (chronic obstructive pulmonary disease) [place]     Spirometry  2004 borderline obstruction  . Hyperlipidemia   . 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 Other and unspecified angina pectoris   . DOE (dyspnea on exertion)   . Pneumonia   . DM type 2 (diabetes mellitus, type 2) (HCC)     Poor control on max oral meds--pt eventually agreed to insulin therap  . CAD, multiple vessel     3V CAD cath 08/08/13----CABG done shortly after.  . Chronic renal insufficiency, stage II (mild)   . Diabetic nephropathy (HCC)     Elevated urine microalb/cr 03/2011  . Hearing loss of both ears 2016    Hearing aids   Allergies  Allergen Reactions  . Morphine And Related Other (See Comments)    Drenched with perspiration  . Demerol [Meperidine] Nausea Only  . Starlix [Nateglinide] Other (See Comments)    gassy   Family History  Problem Relation Age of Onset  . Breast cancer Sister   . Diabetes Maternal Uncle   . Diabetes Paternal Grandmother   . Heart disease Father   .  Heart disease Maternal Uncle   . CVA Mother    Social History   Social History  . Marital Status: Married    Spouse Name: [name]  . Number of Children: 4  . Years of Education: N/A   Occupational History  . bus operator    Social History Main Topics  . Smoking status: Former Smoker -- 1.00 packs/day for 62 years    Types: Cigarettes    Quit date: 06/30/2013  . Smokeless tobacco: Former Systems developer    Quit date: 06/30/2013  . Alcohol Use: No  . Drug Use: No  . Sexual Activity: No   Other Topics Concern  . Not on file   Social History Narrative   Married, 4 children.   Formerly a Medical illustrator.   Local  transportation bus driver.  Level of education: HS.     +tobacco--lifelong (still smoking as of 08/2011).  No alcohol or drugs.   No exercise.  +excessive caffeine.    Review of Systems Negative, with the exception of above mentioned in HPI     Objective:   Physical Exam BP 142/62 mmHg  Pulse 78  Temp(Src) 98.4 F (36.9 C) (Oral)  Resp 24  Wt 215 lb 8 oz (97.75 kg)  SpO2 83% Gen: Afebrile. Mild respiratory distress, unable to complete sentences without stopping to breathe. Tripoding present. HENT: AT. [place]. Bilateral TM visualized and normal in appearance. Tacky mucous membranes Bilateral nares erythema, no swelling. Throat without erythema or exudates.  Eyes:Pupils Equal Round Reactive to light, Extraocular movements intact,  Conjunctiva without redness, discharge or icterus. Neck/lymp/endocrine: Supple, no lymphadenopathy CV: RRR,  plus 1-2 edema bilateral lower extremities Chest: Bilateral lower lung crackles, with expiratory wheeze. Abd: Soft. Obese. NTND. BS present. No Masses palpated.  Skin: No rashes, purpura or petechiae.  Neuro: Slow guarded gait. PERLA. EOMi. Alert Oriented x3      Assessment & Plan:  1. Dyspnea/cough/Low oxygen saturation Patient with history of COPD, pleural effusions, asbestos changes on CT 2014. Last ejection fraction 55% in 2015. Patient with new onset dyspnea and cough. Oxygen saturations sitting initially 91%, after breathing treatment dropped to 85% sitting. With ambulation dropped to 83%.  - Patient will need urgent imaging and lab collection, encouraged patient to be seen in the emergency room immediately. Patient and wife in agreement with plan. - ipratropium-albuterol (DUONEB) 0.5-2.5 (3) MG/3ML nebulizer solution 3 mL; Take 3 mLs by nebulization once.  2. History of asbestosis - Discussed with patient today his CT results in 2014 concerning for asbestos related disease, with COPD, and low oxygen saturations today in the office. Patient was  sent to emergency room for low oxygen saturations with likely either pneumonia or COPD exacerbation. Discussed pulmonary referral to be initiated so that complete evaluation of his pulmonary function and recommendations, with his history of both chronic changes, patient and his wife was in agreement with plan. - Ambulatory referral to Pulmonology  F/U before this weekend if not admitted to hospital .

## 2014-10-29 NOTE — ED Notes (Signed)
Report called to [facility] EMT-P with [facility].

## 2014-10-29 NOTE — H&P (Signed)
Patient Demographics:    Joshua [name], is a 74 y.o. male  MRN: [id number]   DOB - 07/20/1940  Admit Date - 10/29/2014  Outpatient Primary MD for the patient is Tammi Sou, MD   With History of -  Past Medical History  Diagnosis Date  . Hypertension   . Tobacco dependence in remission     100+ pack-yr hx: quit after CABG  . Obesity   . Hyperplastic colon polyp 2001  . Erectile dysfunction     Normal testosterone  . Adenomatous colon polyp 10/16/2011    Repeat 2018  . 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 pylori neg, no dysplasia or malignancy.  . Macular degeneration, dry     Mild, bilat (Optometrist, Mayford Knife at [facility] [facility] in [place], [place])  . Iron deficiency anemia 2014    03/2012 capsule endoscopy showed 2 AVMs--likely responsible for his IDA--lifetime iron supp recommended + q28moCBCs.  . 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 COPD (chronic obstructive pulmonary disease) ([place]     Spirometry  2004 borderline obstruction  . Hyperlipidemia   . 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 Other and unspecified angina pectoris   . DOE (dyspnea on exertion)   . Pneumonia   . DM type 2 (diabetes mellitus, type 2) (HCC)     Poor control on max oral meds--pt eventually agreed to insulin therap  . CAD, multiple vessel     3V CAD cath 08/08/13----CABG done shortly after.  . Chronic renal insufficiency, stage II (mild)   . Diabetic nephropathy (HCC)     Elevated urine microalb/cr 03/2011  . Hearing loss of both ears 2016    Hearing aids      Past Surgical History  Procedure Laterality Date  . Cholecystectomy open  1999  . Appendectomy  1957  . Hip surgery Right 1954   Repair of slipped capital femoral epiphysis.  . Patella fracture surgery Left ~ 1979    bolt + 3 screws to repair tib plateau fx  . Testicle surgery  as a child    Undescended testicle brought down into scrotum  . Tonsillectomy  1947  . Colonoscopy  10/16/2011    Procedure: COLONOSCOPY;  Surgeon: RInda Castle MD;  Location: [facility];  Service: Endoscopy;  Laterality: N/A;  . Cataract extraction w/ intraocular lens  implant, bilateral  04/08/2006 & 04/22/2006  . Esophagogastroduodenoscopy  02/25/12    Atrophic gastritis with a few erosions--capsule endoscopy planned as of 02/25/12 (Dr. KDeatra Ina.  . 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 Hardware removal Right 08/12/2012  Procedure: HARDWARE REMOVAL;  Surgeon: Mcarthur Rossetti, MD;  Location: [facility];  Service: Orthopedics;  Laterality: Right;  . Cardiac catheterization  08/08/2013  . Cataract extraction w/ intraocular lens  implant, bilateral Bilateral   . Coronary artery bypass graft N/A 08/14/2013    Procedure: CORONARY ARTERY BYPASS GRAFTING (CABG);  Surgeon: Melrose Nakayama, MD;  Location: [facility];  Service: Open Heart Surgery;  Laterality: N/A;  Times 4   using left internal mammary artery and endoscopically harvested bilateral saphenous vein  . Intraoperative transesophageal echocardiogram N/A 08/14/2013    Normal LV function. Procedure: INTRAOPERATIVE TRANSESOPHAGEAL ECHOCARDIOGRAM;  Surgeon: Melrose Nakayama, MD;  Location: [facility];  Service: Open Heart Surgery;  Laterality: N/A;  . Pericardial tap N/A 07/01/2013    Procedure: PERICARDIAL TAP;  Surgeon: Jettie Booze, MD;  Location: [facility] CATH LAB;  Service: Cardiovascular;  Laterality: N/A;  . Left heart catheterization with coronary angiogram N/A 08/08/2013    Procedure: LEFT HEART  CATHETERIZATION WITH CORONARY ANGIOGRAM;  Surgeon: Jettie Booze, MD;  Location: [facility] CATH LAB;  Service: Cardiovascular;  Laterality: N/A;    in for   Chief Complaint  Patient presents with  . Shortness of Breath      HPI:    Joshua [name]  is a 74 y.o. male, with history of COPD, ongoing smoking counseled to quit, CAD status post CABG 1 year ago, pericardial effusion with pericardial tamponade requiring up pericardial window placement 1-1/2 years ago, essential hypertension, paroxysmal atrial fibrillation [place] Vasc score of over 3, iron deficiency anemia, macular degeneration, DM type II who lives at his home with his wife presented to [facility] with 4 day history of productive cough, gradually aggressive exertional shortness of breath with wheezing, no orthopnea, no peripheral edema, does have gradual weight gain over the last several months and appears to be obesity other than fluid overload, denies any exposure to sick contacts, did not take flu shot yet, no recent travel, no fever or chills.   He continued to have gradually progressive shortness of breath, productive cough and wheezing and presented to [facility] today, there he was diagnosed with acute hypoxic respiratory failure, he was given prednisone, doxycycline, IV Lasix, had a presumptive diagnosis of COPD exacerbation with possible CHF and was transferred to [facility] for further care.    Review of systems:    In addition to the HPI above,   No Fever-chills, No Headache, No changes with Vision or hearing, No problems swallowing food or Liquids, No Chest pain, positive productive cough, positive shortness of breath, positive wheezing, no orthopnea No Abdominal pain, No Nausea or Vommitting, Bowel movements are regular, No Blood in stool or Urine, No dysuria, No new skin rashes or bruises, No new joints pains-aches,  No new weakness, tingling, numbness in any extremity, No recent  weight gain or loss, No polyuria, polydypsia or polyphagia, No significant Mental Stressors.  A full 10 point Review of Systems was done, except as stated above, all other Review of Systems were negative.    Social History:     Social History  Substance Use Topics  . Smoking status: Former Smoker -- 1.00 packs/day for 62 years    Types: Cigarettes    Quit date: 06/30/2013  . Smokeless tobacco: Former Systems developer    Quit date: 06/30/2013  . Alcohol Use: No    Lives - lives at home with his wife, still drives and ambulates, no functional  disability.      Family History :     Family History  Problem Relation Age of Onset  . Breast cancer Sister   . Diabetes Maternal Uncle   . Diabetes Paternal Grandmother   . Heart disease Father   . Heart disease Maternal Uncle   . CVA Mother        Home Medications:   Prior to Admission medications   Medication Sig Start Date End Date Taking? Authorizing Provider  ACCU-CHEK AVIVA PLUS test strip USE TO CHECK BLOOD SUGAR 3 TIMES DAILY 07/17/14   Tammi Sou, MD  acetaminophen (TYLENOL) 325 MG tablet Take 2 tablets (650 mg total) by mouth every 4 (four) hours as needed for headache or mild pain. 08/09/13   Erlene Quan, PA-C  aspirin 81 MG tablet Take 81 mg by mouth daily.    Historical Provider, MD  budesonide-formoterol (SYMBICORT) 160-4.5 MCG/ACT inhaler Inhale 2 puffs into the lungs 2 (two) times daily. 06/08/13   Tammi Sou, MD  buPROPion [facility] SR) 150 MG 12 hr tablet 1 tab po bid 05/24/14   Tammi Sou, MD  Cholecalciferol (VITAMIN D-3) 1000 UNITS CAPS Take 1,000 Units by mouth daily.     Historical Provider, MD  Chromium Picolinate 1000 MCG TABS Take 1,000 mcg by mouth daily.     Historical Provider, MD  citalopram (CELEXA) 40 MG tablet Take 1 tablet (40 mg total) by mouth daily. 07/23/14   Tammi Sou, MD  furosemide (LASIX) 40 MG tablet 1/2-1 tab po qd prn as directed by MD 05/24/14   Tammi Sou, MD  Insulin  Detemir (LEVEMIR FLEXPEN) 100 UNIT/ML Pen Inject 45 Units into the skin at bedtime. 25 U SQ qhs 07/23/14   Tammi Sou, MD  insulin regular (NOVOLIN R,HUMULIN R) 100 units/mL injection 8 U SQ q AC (take about 20 min prior to eating) 07/23/14   Tammi Sou, MD  isosorbide mononitrate (IMDUR) 30 MG 24 hr tablet Take 1 tablet (30 mg total) by mouth daily. 05/24/14   Tammi Sou, MD  lovastatin (MEVACOR) 40 MG tablet Take 1 tablet (40 mg total) by mouth 2 (two) times daily. 05/24/14   Tammi Sou, MD  metFORMIN (GLUCOPHAGE) 1000 MG tablet Take 1 tablet (1,000 mg total) by mouth 2 (two) times daily with a meal. 05/24/14   Tammi Sou, MD  metoprolol tartrate (LOPRESSOR) 25 MG tablet  09/11/14   Historical Provider, MD  Multiple Vitamin (MULTIVITAMIN WITH MINERALS) TABS Take 1 tablet by mouth daily.    Historical Provider, MD  pioglitazone (ACTOS) 45 MG tablet Take 1 tablet (45 mg total) by mouth daily. 05/24/14   Tammi Sou, MD  ramipril (ALTACE) 10 MG capsule Take 1 capsule (10 mg total) by mouth 2 (two) times daily. 05/24/14   Tammi Sou, MD  tamsulosin (FLOMAX) 0.4 MG CAPS capsule TAKE 1 CAPSULE BY MOUTH EVERY DAY AFTER SUPPER 05/24/14   Tammi Sou, MD     Allergies:     Allergies  Allergen Reactions  . Morphine And Related Other (See Comments)    Drenched with perspiration  . Demerol [Meperidine] Nausea Only  . Starlix [Nateglinide] Other (See Comments)    gassy     Physical Exam:   Vitals  Blood pressure 147/56, pulse 79, temperature 98.5 F (36.9 C), temperature source Oral, resp. rate 18, height _0  (1.803 m), weight 112.038 kg (247 lb), SpO2 94 %.  1. General middle-aged white obese male lying in bed in NAD,     2. Normal affect and insight, Not Suicidal or Homicidal, Awake Alert, Oriented X 3.  3. No F.N deficits, ALL C.Nerves Intact, Strength 5/5 all 4 extremities, Sensation intact all 4 extremities, Plantars down going.  4. Ears and  Eyes appear Normal, Conjunctivae clear, PERRLA. Moist Oral Mucosa.  5. Supple Neck, No JVD, No cervical lymphadenopathy appriciated, No Carotid Bruits.  6. Symmetrical Chest wall movement, Mod air movement bilaterally, bilateral wheezing  7. RRR, No Gallops, Rubs or Murmurs, No Parasternal Heave.  8. Positive Bowel Sounds, Abdomen Soft, No tenderness, No organomegaly appriciated,No rebound -guarding or rigidity.  9.  No Cyanosis, Normal Skin Turgor, No Skin Rash or Bruise.  10. Good muscle tone,  joints appear normal , no effusions, Normal ROM.  11. No Palpable Lymph Nodes in Neck or Axillae      Data Review:    CBC  Recent Labs Lab 10/29/14 1215  WBC 7.5  HGB 11.4*  HCT 37.4*  PLT 169  MCV 85.8  MCH 26.1  MCHC 30.5  RDW 15.4   ------------------------------------------------------------------------------------------------------------------  Chemistries   Recent Labs Lab 10/29/14 1215  NA 139  K 4.2  CL 108  CO2 26  GLUCOSE 178*  BUN 18  CREATININE 0.96  CALCIUM 8.5*  AST 20  ALT 14*  ALKPHOS 63  BILITOT 0.5   ------------------------------------------------------------------------------------------------------------------ estimated creatinine clearance is 84.6 mL/min (by C-G formula based on Cr of 0.96). ------------------------------------------------------------------------------------------------------------------ No results for input(s): TSH, T4TOTAL, T3FREE, THYROIDAB in the last 72 hours.  Invalid input(s): FREET3   Coagulation profile No results for input(s): INR, PROTIME in the last 168 hours. ------------------------------------------------------------------------------------------------------------------- No results for input(s): DDIMER in the last 72 hours. -------------------------------------------------------------------------------------------------------------------  Cardiac Enzymes No results for input(s): CKMB, TROPONINI,  MYOGLOBIN in the last 168 hours.  Invalid input(s): CK ------------------------------------------------------------------------------------------------------------------ Invalid input(s): POCBNP   ---------------------------------------------------------------------------------------------------------------  Urinalysis    Component Value Date/Time   COLORURINE YELLOW 08/11/2013 1624   APPEARANCEUR CLEAR 08/11/2013 1624   LABSPEC 1.020 08/11/2013 1624   PHURINE 5.5 08/11/2013 1624   GLUCOSEU NEGATIVE 08/11/2013 1624   HGBUR NEGATIVE 08/11/2013 1624   BILIRUBINUR NEGATIVE 08/11/2013 1624   KETONESUR NEGATIVE 08/11/2013 1624   PROTEINUR NEGATIVE 08/11/2013 1624   UROBILINOGEN 0.2 08/11/2013 1624   NITRITE NEGATIVE 08/11/2013 1624   LEUKOCYTESUR NEGATIVE 08/11/2013 1624    ----------------------------------------------------------------------------------------------------------------   Imaging Results:    Dg Chest 2 View  10/29/2014  CLINICAL DATA:  Shortness of breath. History of cardiac and pulmonary issues. EXAM: CHEST  2 VIEW COMPARISON:  09/19/2013 FINDINGS: Previous median sternotomy and CABG procedure. Small bilateral pleural effusions and mild interstitial edema noted. Aortic atherosclerosis identified. No airspace consolidation. IMPRESSION: 1. Mild CHF. Electronically Signed   By: Kerby Moors M.D.   On: 10/29/2014 14:07    My personal review of EKG: Rhythm NSR,  no Acute ST changes   Assessment & Plan:     1. Hypoxic respiratory failure secondary to COPD exacerbation and asbestos exposure. Does have productive cough with yellow phlegm suggestive of at least an URI, he will be admitted to a telemetry bed, IV Solu-Medrol, sputum Gram stain culture, by mouth doxycycline, scheduled and as needed nebulizer treatments, continue Advair. Oxygen as needed. He has been counseled to quit smoking, advised to follow with the pulmonologist post discharge.   2. History of  paroxysmal atrial fibrillation. [place] Vasc 4 or above - goal will be rate control,  he currently appears to be in sinus, continue his beta blocker, he is currently not noted to be in anticoagulation, he follows with Dr. Irish Lack is his cardiologist. We will defer chronic anticoagulation to primary cardiology team. For now continue his aspirin along with beta blocker.   3. History of pericardial effusion with tamponade, CAD with CABG. Both stable. Continue aspirin, beta blocker and statin for secondary prevention. No acute issues. EKG sinus.   4. History of chronic diastolic CHF EF 51% on last echogram. Although his pro BNP is mildly elevated and this probably is more right heart strain from COPD exacerbation, does have a mild interstitial pattern on chest x-ray but no rales on exam or JVD, no edema. He already received 40 mg of IV Lasix at [facility]. We will place him on fluid restriction and monitor.   5. DM type II. Continue home dose Lantus, Glucotrol, place on moderate dose sliding scale as he's been exposed to IV Solu Medrol, monitor CBGs closely. Hold Glucophage. A1c.   6. Dyslipidemia. Continue present dose statin.   7. Essential hypertension. Currently stable continue home dose beta blocker.   8. Ongoing smoking. Counseled to quit.    DVT Prophylaxis   Lovenox    AM Labs Ordered, also please review Full Orders  Family Communication: Admission, patients condition and plan of care including tests being ordered have been discussed with the patient and family who indicate understanding and agree with the plan and Code Status.  Code Status Full  Likely DC to  Home 2-3 days  Condition GUARDED    Time spent in minutes : 35    Shanekia Latella K M.D on 10/29/2014 at 5:43 PM  Between 7am to 7pm - Pager - [phone]  After 7pm go to [email or web address] - password [facility]  [facility] - Office  [phone]

## 2014-10-29 NOTE — ED Notes (Addendum)
Sob x 3 days. Rales. Swelling in his feet. Cough. States he was seen by his MD in [place] and was told to come here. He states his oxygen sat in the office was in the 80's.

## 2014-10-29 NOTE — ED Notes (Signed)
43m Albuterol CAT complete. MD aware.

## 2014-10-29 NOTE — ED Provider Notes (Addendum)
CSN: [id number]     Arrival date & time 10/29/14  1152 History   First MD Initiated Contact with Patient 10/29/14 1202     Chief Complaint  Patient presents with  . Shortness of Breath     (Consider location/radiation/quality/duration/timing/severity/associated sxs/prior Treatment) Patient is a 73 y.o. male presenting with shortness of breath.  Shortness of Breath Severity:  Moderate Onset quality:  Gradual Duration:  4 days Timing:  Constant Progression:  Worsening Chronicity:  Recurrent Context: activity   Context: not known allergens and not occupational exposure   Relieved by:  None tried Worsened by:  Nothing tried Ineffective treatments:  None tried Associated symptoms: cough and wheezing   Associated symptoms: no fever, no headaches and no neck pain     Past Medical History  Diagnosis Date  . Hypertension   . Tobacco dependence in remission     100+ pack-yr hx: quit after CABG  . Obesity   . Hyperplastic colon polyp 2001  . Erectile dysfunction     Normal testosterone  . Adenomatous colon polyp 10/16/2011    Repeat 2018  . 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 pylori neg, no dysplasia or malignancy.  . Macular degeneration, dry     Mild, bilat (Optometrist, [place] Knife at [facility] [facility] in [place], [place])  . 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  . 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 COPD (chronic obstructive pulmonary disease) ([place]     Spirometry  2004 borderline obstruction  . Hyperlipidemia   . 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 Other and unspecified angina pectoris   . DOE (dyspnea on exertion)   . Pneumonia   . DM type 2 (diabetes mellitus, type 2) (HCC)     Poor control on max oral meds--pt eventually agreed to  insulin therap  . CAD, multiple vessel     3V CAD cath 08/08/13----CABG done shortly after.  . Chronic renal insufficiency, stage II (mild)   . Diabetic nephropathy (HCC)     Elevated urine microalb/cr 03/2011  . Hearing loss of both ears 2016    Hearing aids   Past Surgical History  Procedure Laterality Date  . Cholecystectomy open  1999  . Appendectomy  1957  . Hip surgery Right 1954    Repair of slipped capital femoral epiphysis.  . Patella fracture surgery Left ~ 1979    bolt + 3 screws to repair tib plateau fx  . Testicle surgery  as a child    Undescended testicle brought down into scrotum  . Tonsillectomy  1947  . Colonoscopy  10/16/2011    Procedure: COLONOSCOPY;  Surgeon: RInda Castle MD;  Location: [facility];  Service: Endoscopy;  Laterality: N/A;  . Cataract extraction w/ intraocular lens  implant, bilateral  04/08/2006 & 04/22/2006  . Esophagogastroduodenoscopy  02/25/12    Atrophic gastritis with a few erosions--capsule endoscopy planned as of 02/25/12 (Dr. KDeatra Ina.  . 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 Hardware removal Right 08/12/2012    Procedure: HARDWARE REMOVAL;  Surgeon: CMcarthur Rossetti MD;  Location:  [facility];  Service: Orthopedics;  Laterality: Right;  . Cardiac catheterization  08/08/2013  . Cataract extraction w/ intraocular lens  implant, bilateral Bilateral   . Coronary artery bypass graft N/A 08/14/2013    Procedure: CORONARY ARTERY BYPASS GRAFTING (CABG);  Surgeon: Melrose Nakayama, MD;  Location: [place];  Service: Open Heart Surgery;  Laterality: N/A;  Times 4   using left internal mammary artery and endoscopically harvested bilateral saphenous vein  . Intraoperative transesophageal echocardiogram N/A 08/14/2013    Normal LV function. Procedure:  INTRAOPERATIVE TRANSESOPHAGEAL ECHOCARDIOGRAM;  Surgeon: Melrose Nakayama, MD;  Location: [place];  Service: Open Heart Surgery;  Laterality: N/A;  . Pericardial tap N/A 07/01/2013    Procedure: PERICARDIAL TAP;  Surgeon: Jettie Booze, MD;  Location: [facility] CATH LAB;  Service: Cardiovascular;  Laterality: N/A;  . Left heart catheterization with coronary angiogram N/A 08/08/2013    Procedure: LEFT HEART CATHETERIZATION WITH CORONARY ANGIOGRAM;  Surgeon: Jettie Booze, MD;  Location: [facility] CATH LAB;  Service: Cardiovascular;  Laterality: N/A;   Family History  Problem Relation Age of Onset  . Breast cancer Sister   . Diabetes Maternal Uncle   . Diabetes Paternal Grandmother   . Heart disease Father   . Heart disease Maternal Uncle   . CVA Mother    Social History  Substance Use Topics  . Smoking status: Former Smoker -- 1.00 packs/day for 62 years    Types: Cigarettes    Quit date: 06/30/2013  . Smokeless tobacco: Former Systems developer    Quit date: 06/30/2013  . Alcohol Use: No    Review of Systems  Constitutional: Negative for fever and chills.  Respiratory: Positive for cough, shortness of breath and wheezing. Negative for chest tightness.   Musculoskeletal: Negative for neck pain.  Neurological: Negative for headaches.  All other systems reviewed and are negative.     Allergies  Morphine and related; Demerol; and Starlix  Home Medications   Prior to Admission medications   Medication Sig Start Date End Date Taking? Authorizing Provider  ACCU-CHEK AVIVA PLUS test strip USE TO CHECK BLOOD SUGAR 3 TIMES DAILY 07/17/14   Tammi Sou, MD  acetaminophen (TYLENOL) 325 MG tablet Take 2 tablets (650 mg total) by mouth every 4 (four) hours as needed for headache or mild pain. 08/09/13   Erlene Quan, PA-C  aspirin 81 MG tablet Take 81 mg by mouth daily.    Historical Provider, MD  budesonide-formoterol (SYMBICORT) 160-4.5 MCG/ACT inhaler Inhale 2 puffs into the lungs 2 (two) times  daily. 06/08/13   Tammi Sou, MD  buPROPion [facility] SR) 150 MG 12 hr tablet 1 tab po bid 05/24/14   Tammi Sou, MD  Cholecalciferol (VITAMIN D-3) 1000 UNITS CAPS Take 1,000 Units by mouth daily.     Historical Provider, MD  Chromium Picolinate 1000 MCG TABS Take 1,000 mcg by mouth daily.     Historical Provider, MD  citalopram (CELEXA) 40 MG tablet Take 1 tablet (40 mg total) by mouth daily. 07/23/14   Tammi Sou, MD  furosemide (LASIX) 40 MG tablet 1/2-1 tab po qd prn as directed by MD 05/24/14   Tammi Sou, MD  Insulin Detemir (LEVEMIR FLEXPEN) 100 UNIT/ML Pen Inject 45 Units into the skin at bedtime. 25 U SQ qhs 07/23/14   Tammi Sou, MD  insulin regular (NOVOLIN R,HUMULIN R) 100 units/mL injection 8 U SQ q AC (take about 20 min prior to eating) 07/23/14  Tammi Sou, MD  isosorbide mononitrate (IMDUR) 30 MG 24 hr tablet Take 1 tablet (30 mg total) by mouth daily. 05/24/14   Tammi Sou, MD  lovastatin (MEVACOR) 40 MG tablet Take 1 tablet (40 mg total) by mouth 2 (two) times daily. 05/24/14   Tammi Sou, MD  metFORMIN (GLUCOPHAGE) 1000 MG tablet Take 1 tablet (1,000 mg total) by mouth 2 (two) times daily with a meal. 05/24/14   Tammi Sou, MD  metoprolol tartrate (LOPRESSOR) 25 MG tablet  09/11/14   Historical Provider, MD  Multiple Vitamin (MULTIVITAMIN WITH MINERALS) TABS Take 1 tablet by mouth daily.    Historical Provider, MD  pioglitazone (ACTOS) 45 MG tablet Take 1 tablet (45 mg total) by mouth daily. 05/24/14   Tammi Sou, MD  ramipril (ALTACE) 10 MG capsule Take 1 capsule (10 mg total) by mouth 2 (two) times daily. 05/24/14   Tammi Sou, MD  tamsulosin (FLOMAX) 0.4 MG CAPS capsule TAKE 1 CAPSULE BY MOUTH EVERY DAY AFTER SUPPER 05/24/14   Tammi Sou, MD   BP 137/61 mmHg  Pulse 110  Temp(Src) 99.4 F (37.4 C) (Oral)  Resp 24  Ht 5' 11.5" (1.816 m)  Wt 215 lb (97.523 kg)  BMI 29.57 kg/m2  SpO2 94% Physical Exam   Constitutional: He is oriented to person, place, and time. He appears well-developed and well-nourished.  HENT:  Head: Normocephalic and atraumatic.  Eyes: Conjunctivae and EOM are normal.  Neck: Normal range of motion. Neck supple.  Cardiovascular: Normal rate and regular rhythm.   Pulmonary/Chest: He is in respiratory distress. He has wheezes. He has rales.  Abdominal: Soft. There is no tenderness.  Musculoskeletal: Normal range of motion. He exhibits edema (bilateral LE). He exhibits no tenderness.  Neurological: He is alert and oriented to person, place, and time.  Skin: Skin is warm and dry.  Nursing note and vitals reviewed.   ED Course  Procedures (including critical care time)  CRITICAL CARE Performed by: Merrily Pew   Total critical care time: 35 minutes  Critical care time was exclusive of separately billable procedures and treating other patients.  Critical care was necessary to treat or prevent imminent or life-threatening deterioration.  Critical care was time spent personally by me on the following activities: development of treatment plan with patient and/or surrogate as well as nursing, discussions with consultants, evaluation of patient's response to treatment, examination of patient, obtaining history from patient or surrogate, ordering and performing treatments and interventions, ordering and review of laboratory studies, ordering and review of radiographic studies, pulse oximetry and re-evaluation of patient's condition.   Labs Review Labs Reviewed  CBC - Abnormal; Notable for the following:    Hemoglobin 11.4 (*)    HCT 37.4 (*)    All other components within normal limits  COMPREHENSIVE METABOLIC PANEL - Abnormal; Notable for the following:    Glucose, Bld 178 (*)    Calcium 8.5 (*)    Total Protein 6.3 (*)    Albumin 3.4 (*)    ALT 14 (*)    All other components within normal limits  BRAIN NATRIURETIC PEPTIDE - Abnormal; Notable for the following:     B Natriuretic Peptide 402.0 (*)    All other components within normal limits  TROPONIN I    Imaging Review Dg Chest 2 View  10/29/2014  CLINICAL DATA:  Shortness of breath. History of cardiac and pulmonary issues. EXAM: CHEST  2 VIEW COMPARISON:  09/19/2013 FINDINGS:  Previous median sternotomy and CABG procedure. Small bilateral pleural effusions and mild interstitial edema noted. Aortic atherosclerosis identified. No airspace consolidation. IMPRESSION: 1. Mild CHF. Electronically Signed   By: Kerby Moors M.D.   On: 10/29/2014 14:07   I have personally reviewed and evaluated these images and lab results as part of my medical decision-making.   EKG Interpretation   Date/Time:  Monday October 29 2014 12:08:16 EDT Ventricular Rate:  78 PR Interval:  222 QRS Duration: 104 QT Interval:  412 QTC Calculation: 469 R Axis:   -35 Text Interpretation:  Sinus rhythm with 1st degree A-V block Left axis  deviation Abnormal ECG No significant change since last tracing Confirmed  by [facility] MD, Corene Cornea [phone]) on 10/29/2014 12:11:59 PM      MDM   Final diagnoses:  Cough  Low oxygen saturation  Acute on chronic respiratory failure with hypoxia [facility])   74 year old male here with likely COPD exacerbation with new onset hypoxia. Mild respiratory distress with tachypnea but no accessory muscle usage. Significant wheezing rales and mild bilateral lower extremity edema also raises the possibility of a component of heart failure. Continuous albuterol, steroids, antibiotics were administered here with slight improvement in symptoms. Still hypoxic on room air just with talking down to 87% started on 2 L nasal cannula oxygenation. Discussed case with hospitalist for admission will admit to telemetry at [facility].  Discussed the case with the family and [facility] at Bedside. This patient has improved respiratory status, no decompensation while in the emergency department and I feel like the patient is still  stable for a telemetry bed and does not need stepdown or ICU at this time.     Merrily Pew, MD 10/29/14 1552  Merrily Pew, MD 10/29/14 [phone]

## 2014-10-29 NOTE — ED Notes (Signed)
[facility] is transporting

## 2014-10-29 NOTE — Progress Notes (Signed)
PHARMACIST - PHYSICIAN ORDER COMMUNICATION  CONCERNING: P&T Medication Policy on Herbal Medications  DESCRIPTION:  This patient's order for:  Chromium picolinate  has been noted.  This product(s) is classified as an "herbal" or natural product. Due to a lack of definitive safety studies or FDA approval, nonstandard manufacturing practices, plus the potential risk of unknown drug-drug interactions while on inpatient medications, the Pharmacy and Therapeutics Committee does not permit the use of "herbal" or natural products of this type within [facility].   ACTION TAKEN: The pharmacy department is unable to verify this order at this time and your patient has been informed of this safety policy. Please reevaluate patient's clinical condition at discharge and address if the herbal or natural product(s) should be resumed at that time.  Albertina Parr, PharmD., BCPS Clinical Pharmacist Pager [phone]

## 2014-10-29 NOTE — ED Notes (Signed)
Pt placed on cardiac monitor 

## 2014-10-29 NOTE — ED Notes (Signed)
[facility] at bedside for transport.

## 2014-10-30 ENCOUNTER — Other Ambulatory Visit: Payer: Self-pay | Admitting: Family Medicine

## 2014-10-30 ENCOUNTER — Inpatient Hospital Stay (HOSPITAL_COMMUNITY): Payer: Medicare Other

## 2014-10-30 DIAGNOSIS — N182 Chronic kidney disease, stage 2 (mild): Secondary | ICD-10-CM | POA: Diagnosis not present

## 2014-10-30 DIAGNOSIS — Z951 Presence of aortocoronary bypass graft: Secondary | ICD-10-CM | POA: Diagnosis not present

## 2014-10-30 DIAGNOSIS — E669 Obesity, unspecified: Secondary | ICD-10-CM | POA: Diagnosis not present

## 2014-10-30 DIAGNOSIS — M13852 Other specified arthritis, left hip: Secondary | ICD-10-CM | POA: Diagnosis not present

## 2014-10-30 DIAGNOSIS — I314 Cardiac tamponade: Secondary | ICD-10-CM | POA: Diagnosis not present

## 2014-10-30 DIAGNOSIS — Z77090 Contact with and (suspected) exposure to asbestos: Secondary | ICD-10-CM | POA: Diagnosis not present

## 2014-10-30 DIAGNOSIS — I2510 Atherosclerotic heart disease of native coronary artery without angina pectoris: Secondary | ICD-10-CM | POA: Diagnosis not present

## 2014-10-30 DIAGNOSIS — Z885 Allergy status to narcotic agent status: Secondary | ICD-10-CM | POA: Diagnosis not present

## 2014-10-30 DIAGNOSIS — I428 Other cardiomyopathies: Secondary | ICD-10-CM

## 2014-10-30 DIAGNOSIS — E1121 Type 2 diabetes mellitus with diabetic nephropathy: Secondary | ICD-10-CM | POA: Diagnosis not present

## 2014-10-30 DIAGNOSIS — I5033 Acute on chronic diastolic (congestive) heart failure: Secondary | ICD-10-CM | POA: Diagnosis not present

## 2014-10-30 DIAGNOSIS — D509 Iron deficiency anemia, unspecified: Secondary | ICD-10-CM | POA: Diagnosis not present

## 2014-10-30 DIAGNOSIS — H3530 Unspecified macular degeneration: Secondary | ICD-10-CM | POA: Diagnosis not present

## 2014-10-30 DIAGNOSIS — J9621 Acute and chronic respiratory failure with hypoxia: Principal | ICD-10-CM

## 2014-10-30 DIAGNOSIS — J441 Chronic obstructive pulmonary disease with (acute) exacerbation: Secondary | ICD-10-CM | POA: Diagnosis not present

## 2014-10-30 DIAGNOSIS — M13851 Other specified arthritis, right hip: Secondary | ICD-10-CM | POA: Diagnosis not present

## 2014-10-30 DIAGNOSIS — I129 Hypertensive chronic kidney disease with stage 1 through stage 4 chronic kidney disease, or unspecified chronic kidney disease: Secondary | ICD-10-CM | POA: Diagnosis not present

## 2014-10-30 DIAGNOSIS — Z7984 Long term (current) use of oral hypoglycemic drugs: Secondary | ICD-10-CM | POA: Diagnosis not present

## 2014-10-30 DIAGNOSIS — I480 Paroxysmal atrial fibrillation: Secondary | ICD-10-CM | POA: Diagnosis not present

## 2014-10-30 DIAGNOSIS — E1122 Type 2 diabetes mellitus with diabetic chronic kidney disease: Secondary | ICD-10-CM | POA: Diagnosis not present

## 2014-10-30 DIAGNOSIS — Z886 Allergy status to analgesic agent status: Secondary | ICD-10-CM | POA: Diagnosis not present

## 2014-10-30 DIAGNOSIS — Z888 Allergy status to other drugs, medicaments and biological substances status: Secondary | ICD-10-CM | POA: Diagnosis not present

## 2014-10-30 DIAGNOSIS — Z86010 Personal history of colonic polyps: Secondary | ICD-10-CM | POA: Diagnosis not present

## 2014-10-30 DIAGNOSIS — E785 Hyperlipidemia, unspecified: Secondary | ICD-10-CM | POA: Diagnosis not present

## 2014-10-30 DIAGNOSIS — M13862 Other specified arthritis, left knee: Secondary | ICD-10-CM | POA: Diagnosis not present

## 2014-10-30 DIAGNOSIS — F17210 Nicotine dependence, cigarettes, uncomplicated: Secondary | ICD-10-CM | POA: Diagnosis not present

## 2014-10-30 HISTORY — PX: TRANSTHORACIC ECHOCARDIOGRAM: SHX275

## 2014-10-30 LAB — BASIC METABOLIC PANEL
ANION GAP: 11 (ref 5–15)
BUN: 21 mg/dL — ABNORMAL HIGH (ref 6–20)
CALCIUM: 9.3 mg/dL (ref 8.9–10.3)
CHLORIDE: 99 mmol/L — AB (ref 101–111)
CO2: 26 mmol/L (ref 22–32)
Creatinine, Ser: 0.93 mg/dL (ref 0.61–1.24)
GFR calc non Af Amer: >60 mL/min (ref >60)
GLUCOSE: 378 mg/dL — AB (ref 65–99)
POTASSIUM: 4.6 mmol/L (ref 3.5–5.1)
Sodium: 136 mmol/L (ref 135–145)

## 2014-10-30 LAB — CBC
HEMATOCRIT: 38.4 % — AB (ref 39.0–52.0)
HEMOGLOBIN: 11.7 g/dL — AB (ref 13.0–17.0)
MCH: 26 pg (ref 26.0–34.0)
MCHC: 30.5 g/dL (ref 30.0–36.0)
MCV: 85.3 fL (ref 78.0–100.0)
Platelets: 158 10*3/uL (ref 150–400)
RBC: 4.5 MIL/uL (ref 4.22–5.81)
RDW: 15.4 % (ref 11.5–15.5)
WBC: 6.5 10*3/uL (ref 4.0–10.5)

## 2014-10-30 LAB — GLUCOSE, CAPILLARY
GLUCOSE-CAPILLARY: 288 mg/dL — AB (ref 65–99)
GLUCOSE-CAPILLARY: 301 mg/dL — AB (ref 65–99)
Glucose-Capillary: 316 mg/dL — ABNORMAL HIGH (ref 65–99)
Glucose-Capillary: 192 mg/dL — ABNORMAL HIGH (ref 65–99)

## 2014-10-30 LAB — HEMOGLOBIN A1C
HEMOGLOBIN A1C: 7.7 % — AB (ref 4.8–5.6)
Mean Plasma Glucose: 174 mg/dL

## 2014-10-30 MED ORDER — HYDRALAZINE HCL 20 MG/ML IJ SOLN: 10.0000 mg | INTRAMUSCULAR | Status: DC | PRN

## 2014-10-30 MED ORDER — INSULIN DETEMIR 100 UNIT/ML ~~LOC~~ SOLN
30.0000 [IU] | Freq: Every day | SUBCUTANEOUS | Status: DC
  Administered 2014-10-30 – 2014-10-31 (×2): 30 [IU] via SUBCUTANEOUS
  Filled 2014-10-30 (×2): qty 0.3

## 2014-10-30 MED ORDER — INSULIN ASPART 100 UNIT/ML ~~LOC~~ SOLN
0.0000 [IU] | Freq: Three times a day (TID) | SUBCUTANEOUS | Status: DC
  Administered 2014-10-30: 11 [IU] via SUBCUTANEOUS
  Administered 2014-10-30: 15 [IU] via SUBCUTANEOUS
  Administered 2014-10-31: 7 [IU] via SUBCUTANEOUS
  Administered 2014-10-31: 11 [IU] via SUBCUTANEOUS
  Administered 2014-10-31: 15 [IU] via SUBCUTANEOUS
  Administered 2014-11-01: 4 [IU] via SUBCUTANEOUS
  Administered 2014-11-01: 20 [IU] via SUBCUTANEOUS
  Administered 2014-11-01: 7 [IU] via SUBCUTANEOUS
  Administered 2014-11-02: 4 [IU] via SUBCUTANEOUS

## 2014-10-30 MED ORDER — FUROSEMIDE 20 MG PO TABS
20.0000 mg | ORAL_TABLET | Freq: Two times a day (BID) | ORAL | Status: DC
  Administered 2014-10-30: 20 mg via ORAL
  Filled 2014-10-30: qty 1

## 2014-10-30 MED ORDER — LOSARTAN POTASSIUM 50 MG PO TABS
50.0000 mg | ORAL_TABLET | Freq: Every day | ORAL | Status: DC
  Administered 2014-10-30 – 2014-11-01 (×3): 50 mg via ORAL
  Filled 2014-10-30 (×3): qty 1

## 2014-10-30 MED ORDER — INSULIN ASPART 100 UNIT/ML ~~LOC~~ SOLN
6.0000 [IU] | Freq: Three times a day (TID) | SUBCUTANEOUS | Status: DC
  Administered 2014-10-30 – 2014-11-01 (×6): 6 [IU] via SUBCUTANEOUS

## 2014-10-30 MED ORDER — IPRATROPIUM BROMIDE 0.02 % IN SOLN
0.5000 mg | Freq: Four times a day (QID) | RESPIRATORY_TRACT | Status: DC
  Administered 2014-10-30 – 2014-11-01 (×10): 0.5 mg via RESPIRATORY_TRACT
  Filled 2014-10-30 (×10): qty 2.5

## 2014-10-30 MED ORDER — FUROSEMIDE 10 MG/ML IJ SOLN
40.0000 mg | Freq: Two times a day (BID) | INTRAMUSCULAR | Status: DC
  Administered 2014-10-30 – 2014-10-31 (×2): 40 mg via INTRAVENOUS
  Filled 2014-10-30 (×2): qty 4

## 2014-10-30 MED ORDER — INSULIN ASPART 100 UNIT/ML ~~LOC~~ SOLN
0.0000 [IU] | Freq: Every day | SUBCUTANEOUS | Status: DC
Start: 2014-10-30 — End: 2014-11-01
  Administered 2014-10-31: 3 [IU] via SUBCUTANEOUS

## 2014-10-30 MED ORDER — LOSARTAN POTASSIUM 50 MG PO TABS
100.0000 mg | ORAL_TABLET | Freq: Every day | ORAL | Status: DC
  Administered 2014-10-31 – 2014-11-02 (×3): 100 mg via ORAL
  Filled 2014-10-30 (×3): qty 2

## 2014-10-30 MED ORDER — LEVALBUTEROL HCL 0.63 MG/3ML IN NEBU
0.6300 mg | INHALATION_SOLUTION | RESPIRATORY_TRACT | Status: DC | PRN
Start: 2014-10-30 — End: 2014-11-02

## 2014-10-30 MED ORDER — LEVALBUTEROL HCL 0.63 MG/3ML IN NEBU
0.6300 mg | INHALATION_SOLUTION | Freq: Four times a day (QID) | RESPIRATORY_TRACT | Status: DC
  Administered 2014-10-30 – 2014-11-01 (×10): 0.63 mg via RESPIRATORY_TRACT
  Filled 2014-10-30 (×10): qty 3

## 2014-10-30 MED ORDER — LIVING WELL WITH DIABETES BOOK
Freq: Once | Status: AC
  Administered 2014-10-30: 09:00:00
  Filled 2014-10-30: qty 1

## 2014-10-30 MED ORDER — AMLODIPINE BESYLATE 5 MG PO TABS
5.0000 mg | ORAL_TABLET | Freq: Every day | ORAL | Status: DC
  Administered 2014-10-31: 5 mg via ORAL
  Filled 2014-10-30: qty 1

## 2014-10-30 NOTE — Consult Note (Signed)
Advanced Heart Failure Team Consult Note  Referring Physician: [place] Pink Primary Physician: Dr Shawnie Dapper Primary Cardiologist:  Dr. Irish Lack  Reason for Consultation: A/C CHF  HPI:    Joshua [name] is a 74 y.o. male with history of CAD s/p CABG x 3, COPD, CKD stage II, DM2, HTN and tobacco abuse who follows with Dr Irish Lack as an outpatient. After treatment for pericarditis in 06/2013 LHC showed 3 vessel disease and underwent CABG. Last seen by Dr Irish Lack 06/2014 and had resumed smoking and complained of some DOE and Edema, otherwise stable and continued Lasix 20-40 mg daily prn for LE swelling.  He presented to [facility] ER on 10/29/14 with worsening DOE and increased sputum x 4 days. Treated with prednisone, doxycycline, IV lasix but continued to worsen and was transported to [facility] for further management.  Pertinent admission labs include K 4.2, Creatinine 0.96, and BNP 402.0. CXR showed Mild CHF with small bilateral pleural effusions and mild interstitial edema. No consolidation.   States he started feeling bad last week, but got much worse after working outside and having a cookout on Saturday (10/27/14). C/o increased sputum and DOE. Says his breathing feels about the same since admission. Feels better for short periods of time after a breathing treatment. Can walk about 100 ft before having to stop at rest. Rests frequently in grocery store. Takes all medications as directed. Tries to limit salt intake but no set goal, drinks less than 2L a day. Taking lasix 20 mg BID at home.  Last known home weight 240 approx 2 weeks ago. As low as 230s in June. Does not weigh regularly.  Currently smoking 1/2 ppd. He denies CP, orthopnea, PND, or near syncope.  Does get occasionally lightheaded with rapid standing.   Review of Systems: [y] = yes, _0  = no   General: Weight gain [y]; Weight loss _1 ; Anorexia _2 ; Fatigue _3 ; Fever _4 ; Chills _5 ; Weakness _6   Cardiac: Chest pain/pressure _7 ; Resting  SOB _8 ; Exertional SOB [y]; Orthopnea [y]; Pedal Edema [y]; Palpitations _9 ; Syncope _10 ; Presyncope _11 ; Paroxysmal nocturnal dyspnea_12   Pulmonary: Cough [y]; Wheezing[y]; Hemoptysis_13 ; Sputum [y]; Snoring _14   GI: Vomiting_15 ; Dysphagia_16 ; Melena_17 ; Hematochezia _18 ; Heartburn_19 ; Abdominal pain _20 ; Constipation _21 ; Diarrhea _22 ; BRBPR _23   GU: Hematuria_24 ; Dysuria _25 ; Nocturia_26   Vascular: Pain in legs with walking _27 ; Pain in feet with lying flat _28 ; Non-healing sores _29 ; Stroke _30 ; TIA _31 ; Slurred speech _32 ;  Neuro: Headaches_33 ; Vertigo_34 ; Seizures_35 ; Paresthesias_36 ;Blurred vision _37 ; Diplopia _38 ; Vision changes _39   Ortho/Skin: Arthritis [y]; Joint pain [y]; Muscle pain _40 ; Joint swelling _41 ; Back Pain _42 ; Rash _43   Psych: Depression_44 ; Anxiety_45   Heme: Bleeding problems _46 ; Clotting disorders _47 ; Anemia _48   Endocrine: Diabetes _49 ; Thyroid dysfunction_50   Home Medications Prior to Admission medications   Medication Sig Start Date End Date Taking? Authorizing Provider  acetaminophen (TYLENOL) 325 MG tablet Take 2 tablets (650 mg total) by mouth every 4 (four) hours as needed for headache or mild pain. 08/09/13  Yes Erlene Quan, PA-C  aspirin 81 MG tablet Take 81 mg by mouth at bedtime.    Yes Historical Provider, MD  budesonide-formoterol (SYMBICORT) 160-4.5 MCG/ACT inhaler Inhale 2 puffs into the lungs 2 (  two) times daily. 06/08/13  Yes Tammi Sou, MD  buPROPion (WELLBUTRIN SR) 150 MG 12 hr tablet 1 tab po bid Patient taking differently: Take 150 mg by mouth 2 (two) times daily. 1 tab po bid 05/24/14  Yes Tammi Sou, MD  Cholecalciferol (VITAMIN D-3) 1000 UNITS CAPS Take 1,000 Units by mouth daily.    Yes Historical Provider, MD  Chromium Picolinate 1000 MCG TABS Take 1,000 mcg by mouth daily.    Yes Historical Provider, MD  citalopram (CELEXA) 40 MG tablet Take 1 tablet (40 mg total) by mouth daily. 07/23/14  Yes Tammi Sou, MD  furosemide  (LASIX) 40 MG tablet 1/2-1 tab po qd prn as directed by MD Patient taking differently: Take 20 mg by mouth 2 (two) times daily.  05/24/14  Yes Tammi Sou, MD  Insulin Detemir (LEVEMIR FLEXPEN) 100 UNIT/ML Pen Inject 45 Units into the skin at bedtime. 25 U SQ qhs Patient taking differently: Inject 20-35 Units into the skin at bedtime. Sliding Scale: 130-140 = 20 units, 140-150 = 21 units, 150-160 = 22 units, 250 and above = 35 units 07/23/14  Yes Tammi Sou, MD  insulin regular (NOVOLIN R,HUMULIN R) 100 units/mL injection 8 U SQ q AC (take about 20 min prior to eating) Patient taking differently: Inject 8 Units into the skin 3 (three) times daily before meals. 8 U SQ q AC (take about 20 min prior to eating) 07/23/14  Yes Tammi Sou, MD  isosorbide mononitrate (IMDUR) 30 MG 24 hr tablet Take 1 tablet (30 mg total) by mouth daily. 05/24/14  Yes Tammi Sou, MD  lovastatin (MEVACOR) 40 MG tablet Take 1 tablet (40 mg total) by mouth 2 (two) times daily. 05/24/14  Yes Tammi Sou, MD  metFORMIN (GLUCOPHAGE) 1000 MG tablet Take 1 tablet (1,000 mg total) by mouth 2 (two) times daily with a meal. 05/24/14  Yes Tammi Sou, MD  metoprolol tartrate (LOPRESSOR) 25 MG tablet Take 25 mg by mouth daily.  09/11/14  Yes Historical Provider, MD  Multiple Vitamin (MULTIVITAMIN WITH MINERALS) TABS Take 1 tablet by mouth daily.   Yes Historical Provider, MD  pioglitazone (ACTOS) 45 MG tablet Take 1 tablet (45 mg total) by mouth daily. 05/24/14  Yes Tammi Sou, MD  ramipril (ALTACE) 10 MG capsule Take 1 capsule (10 mg total) by mouth 2 (two) times daily. 05/24/14  Yes Tammi Sou, MD  tamsulosin (FLOMAX) 0.4 MG CAPS capsule TAKE 1 CAPSULE BY MOUTH EVERY DAY AFTER SUPPER 05/24/14  Yes Tammi Sou, MD    Past Medical History: Past Medical History  Diagnosis Date  . Hypertension   . Tobacco dependence in remission     100+ pack-yr hx: quit after CABG  . Obesity   . Hyperplastic  colon polyp 2001  . Erectile dysfunction     Normal testosterone  . Adenomatous colon polyp 10/16/2011    Repeat 2018  . 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 pylori neg, no dysplasia or malignancy.  . Macular degeneration, dry     Mild, bilat (Optometrist, Mayford Knife at [employer] [facility] in [place], [place])  . 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  . 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 COPD (chronic obstructive pulmonary disease) ([place]     Spirometry  2004 borderline obstruction  . Hyperlipidemia   . 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 Other and unspecified angina pectoris   . DOE (dyspnea on exertion)   . Pneumonia   . DM type 2 (diabetes mellitus, type 2) (HCC)     Poor control on max oral meds--pt eventually agreed to insulin therap  . CAD, multiple vessel     3V CAD cath 08/08/13----CABG done shortly after.  . Chronic renal insufficiency, stage II (mild)   . Diabetic nephropathy (HCC)     Elevated urine microalb/cr 03/2011  . Hearing loss of both ears 2016    Hearing aids    Past Surgical History: Past Surgical History  Procedure Laterality Date  . Cholecystectomy open  1999  . Appendectomy  1957  . Hip surgery Right 1954    Repair of slipped capital femoral epiphysis.  . Patella fracture surgery Left ~ 1979    bolt + 3 screws to repair tib plateau fx  . Testicle surgery  as a child    Undescended testicle brought down into scrotum  . Tonsillectomy  1947  . Colonoscopy  10/16/2011    Procedure: COLONOSCOPY;  Surgeon: Inda Castle, MD;  Location: [facility];  Service: Endoscopy;  Laterality: N/A;  . Cataract extraction w/ intraocular lens  implant, bilateral  04/08/2006 & 04/22/2006  . Esophagogastroduodenoscopy  02/25/12    Atrophic  gastritis with a few erosions--capsule endoscopy planned as of 02/25/12 (Dr. Deatra Ina).  . Total hip arthroplasty Right 08/12/2012    Procedure: REMOVAL OF OLD PINS RIGHT HIP AND RIGHT TOTAL HIP ARTHROPLASTY ANTERIOR APPROACH;  Surgeon: Mcarthur Rossetti, MD;  Location: [facility];  Service: Orthopedics;  Laterality: Right;  . Hardware removal Right 08/12/2012    Procedure: HARDWARE REMOVAL;  Surgeon: Mcarthur Rossetti, MD;  Location: [facility];  Service: Orthopedics;  Laterality: Right;  . Cardiac catheterization  08/08/2013  . Cataract extraction w/ intraocular lens  implant, bilateral Bilateral   . Coronary artery bypass graft N/A 08/14/2013    Procedure: CORONARY ARTERY BYPASS GRAFTING (CABG);  Surgeon: Melrose Nakayama, MD;  Location: [facility];  Service: Open Heart Surgery;  Laterality: N/A;  Times 4   using left internal mammary artery and endoscopically harvested bilateral saphenous vein  . Intraoperative transesophageal echocardiogram N/A 08/14/2013    Normal LV function. Procedure: INTRAOPERATIVE TRANSESOPHAGEAL ECHOCARDIOGRAM;  Surgeon: Melrose Nakayama, MD;  Location: [place];  Service: Open Heart Surgery;  Laterality: N/A;  . Pericardial tap N/A 07/01/2013    Procedure: PERICARDIAL TAP;  Surgeon: Jettie Booze, MD;  Location: [facility] CATH LAB;  Service: Cardiovascular;  Laterality: N/A;  . Left heart catheterization with coronary angiogram N/A 08/08/2013    Procedure: LEFT HEART CATHETERIZATION WITH CORONARY ANGIOGRAM;  Surgeon: Jettie Booze, MD;  Location: [facility] CATH LAB;  Service: Cardiovascular;  Laterality: N/A;    Family History: Family History  Problem Relation Age of Onset  . Breast cancer Sister   . Diabetes Maternal Uncle   . Diabetes Paternal Grandmother   . Heart disease Father   . Heart disease Maternal Uncle   . CVA Mother     Social History: Social History   Social History  . Marital Status: Married    Spouse Name: [name]  . Number of Children: 4  . Years of  Education: N/A   Occupational History  . bus operator    Social History Main Topics  .  Smoking status: Former Smoker -- 1.00 packs/day for 62 years    Types: Cigarettes    Quit date: 06/30/2013  . Smokeless tobacco: Former Systems developer    Quit date: 06/30/2013  . Alcohol Use: No  . Drug Use: No  . Sexual Activity: No   Other Topics Concern  . None   Social History Narrative   Married, 4 children.   Formerly a Medical illustrator.   Local transportation bus driver.  Level of education: HS.     +tobacco--lifelong (still smoking as of 08/2011).  No alcohol or drugs.   No exercise.  +excessive caffeine.    Allergies:  Allergies  Allergen Reactions  . Morphine And Related Other (See Comments)    Drenched with perspiration  . Demerol [Meperidine] Nausea Only  . Starlix [Nateglinide] Other (See Comments)    gassy    Objective:    Vital Signs:   Temp:  [98.1 F (36.7 C)-99.4 F (37.4 C)] 98.2 F (36.8 C) (10/25 0836) Pulse Rate:  [65-110] 77 (10/25 0836) Resp:  [16-25] 20 (10/25 0836) BP: (132-155)/(50-61) 155/56 mmHg (10/25 0836) SpO2:  [87 %-100 %] 92 % (10/25 0849) Weight:  [215 lb (97.523 kg)-247 lb (112.038 kg)] 243 lb 9.6 oz (110.496 kg) (10/25 0356) Last BM Date: 10/28/14  Weight change: Filed Weights   10/29/14 1157 10/29/14 1625 10/30/14 0356  Weight: 215 lb (97.523 kg) 247 lb (112.038 kg) 243 lb 9.6 oz (110.496 kg)    Intake/Output:   Intake/Output Summary (Last 24 hours) at 10/30/14 1006 Last data filed at 10/30/14 0835  Gross per 24 hour  Intake   1300 ml  Output   3000 ml  Net  -1700 ml     Physical Exam: General:  Elderly appearing.  HEENT: normal Neck: supple. JVP to ear. Carotids 2+ bilat; no bruits. No lymphadenopathy or thyromegaly noted. Cor: PMI nondisplaced. RRR. No rubs, gallops or murmurs appreciated. Lungs: Diffuse wheezing and Rhonchi. Abdomen: Obese, soft, nontender, nondistended. No hepatosplenomegaly. No bruits or masses.  +BS Extremities: no cyanosis, clubbing, rash. 1+ edema with trace into thighs, R>L Neuro: alert & orientedx3, cranial nerves grossly intact. moves all 4 extremities w/o difficulty. Affect pleasant  Telemetry: NSR 70s  Labs: Basic Metabolic Panel:  Recent Labs Lab 10/29/14 1215 10/29/14 1858 10/30/14 0506  NA 139  --  136  K 4.2  --  4.6  CL 108  --  99*  CO2 26  --  26  GLUCOSE 178*  --  378*  BUN 18  --  21*  CREATININE 0.96 1.14 0.93  CALCIUM 8.5*  --  9.3    Liver Function Tests:  Recent Labs Lab 10/29/14 1215  AST 20  ALT 14*  ALKPHOS 63  BILITOT 0.5  PROT 6.3*  ALBUMIN 3.4*   No results for input(s): LIPASE, AMYLASE in the last 168 hours. No results for input(s): AMMONIA in the last 168 hours.  CBC:  Recent Labs Lab 10/29/14 1215 10/29/14 1858 10/30/14 0506  WBC 7.5 7.8 6.5  HGB 11.4* 11.3* 11.7*  HCT 37.4* 37.4* 38.4*  MCV 85.8 85.4 85.3  PLT 169 151 158    Cardiac Enzymes:  Recent Labs Lab 10/29/14 1858  TROPONINI <0.03    BNP: BNP (last 3 results)  Recent Labs  10/29/14 1215  BNP 402.0*    ProBNP (last 3 results) No results for input(s): PROBNP in the last 8760 hours.   CBG:  Recent Labs Lab 10/29/14 2108 10/30/14 0555  GLUCAP  382* 316*    Coagulation Studies: No results for input(s): LABPROT, INR in the last 72 hours.  Other results: EKG: Sinus rhythm at 78 bpm with 1' AV block (PR interval 222)  Imaging: Dg Chest 2 View  10/29/2014  CLINICAL DATA:  Shortness of breath. History of cardiac and pulmonary issues. EXAM: CHEST  2 VIEW COMPARISON:  09/19/2013 FINDINGS: Previous median sternotomy and CABG procedure. Small bilateral pleural effusions and mild interstitial edema noted. Aortic atherosclerosis identified. No airspace consolidation. IMPRESSION: 1. Mild CHF. Electronically Signed   By: Kerby Moors M.D.   On: 10/29/2014 14:07      Medications:     Current Medications: . antiseptic oral rinse  7 mL  Mouth Rinse BID  . aspirin EC  81 mg Oral Daily  . budesonide-formoterol  2 puff Inhalation BID  . buPROPion  150 mg Oral BID  . cholecalciferol  1,000 Units Oral Daily  . citalopram  40 mg Oral Daily  . doxycycline  100 mg Oral BID  . enoxaparin (LOVENOX) injection  40 mg Subcutaneous Q24H  . furosemide  20 mg Oral BID  . Influenza vac split quadrivalent PF  0.5 mL Intramuscular Tomorrow-1000  . insulin aspart  0-20 Units Subcutaneous TID WC  . insulin aspart  0-5 Units Subcutaneous QHS  . insulin aspart  6 Units Subcutaneous TID WC  . insulin detemir  30 Units Subcutaneous QHS  . ipratropium  0.5 mg Nebulization Q6H  . isosorbide mononitrate  30 mg Oral Daily  . levalbuterol  0.63 mg Nebulization Q6H  . living well with diabetes book   Does not apply Once  . methylPREDNISolone (SOLU-MEDROL) injection  60 mg Intravenous TID  . metoprolol tartrate  25 mg Oral BID  . multivitamin with minerals  1 tablet Oral Daily  . pioglitazone  45 mg Oral Daily  . pravastatin  20 mg Oral q1800  . ramipril  10 mg Oral BID  . sodium chloride  3 mL Intravenous Q12H  . tamsulosin  0.4 mg Oral QPC supper     Infusions:      Assessment   1. Acute on chronic resp failure c COPD 2. Diastolic CHF - EF 77% last echo, repeat pending. 3. Paroxysmal AF - CHA2DS2-VASc at least 4. 4. HTN  5. Tobacco Abuse 6. DM2 - per primary  Plan   Lungs with diffuse wheezing and volume overloaded on exam Likely combined COPD and CHF causing exacerbation. ECHO pending. Last reported echo with EF 60%  Will switch ramipril to losartan 100 mg am 50 mg pm starting this evening and add amlodopine 5 mg. Orders placed for hydralazine 10 mg prn for SBPs >160.  Hold BB with active wheezing. Consider switch to bisoprolol on resume.  Out 3.2 L and down 4 lbs thus far with 40 mg IV lasix.  Will give 40 mg IV lasix BID for now, think he still has at least 4-5 lbs to go. COPD per primary team.   Stressed the importance of  tobacco cessation. Has failed with wellbutrin in the past. Stopped for several months after CABG but started again 2/2 stress. Smoked 1/2 ppd up until this admission.  Says he is going to stop.  Length of Stay: 1  Shirley Friar PA-C 10/30/2014, 10:06 AM  Advanced Heart Failure Team Pager [phone] (M-F; 7a - 4p)  Please contact [facility] for night-coverage after hours (4p -7a ) and weekends on [email or web address]  Patient seen and examined with Oda Kilts,  PA-C. We discussed all aspects of the encounter. I agree with the assessment and plan as stated above.   Echo reviewed personally EF 60%. RV ok. Major issue is COPD flare but also has volume overload. Agree with current regimen. Continue lasix 19m IV bid. Watch renal function. We will follow. Hold b-blocker.  Eriona Kinchen,MD 4:47 PM

## 2014-10-30 NOTE — Progress Notes (Signed)
[facility] PROGRESS NOTE  Joshua [name] KGY:[id number] DOB: 01-Mar-1940 DOA: 10/29/2014  PCP: Tammi Sou, MD  Brief HPI: 74 year old Caucasian male with a past medical history of COPD, coronary artery disease status post CABG in 2015, history of pericardial effusion presented with 4 day history of productive cough, shortness of breath with wheezing. He was hospitalized for further management.  Past medical history:  Past Medical History  Diagnosis Date  . Hypertension   . Tobacco dependence in remission     100+ pack-yr hx: quit after CABG  . Obesity   . Hyperplastic colon polyp 2001  . Erectile dysfunction     Normal testosterone  . Adenomatous colon polyp 10/16/2011    Repeat 2018  . 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 pylori neg, no dysplasia or malignancy.  . Macular degeneration, dry     Mild, bilat (Optometrist, Mayford Knife at [employer] [facility] [place] in [place], [place])  . Iron deficiency anemia 2014    03/2012 capsule endoscopy showed 2 AVMs--likely responsible for his IDA--lifetime iron supp recommended + q52moCBCs.  . 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 COPD (chronic obstructive pulmonary disease) ([place]     Spirometry  2004 borderline obstruction  . Hyperlipidemia   . 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 Other and unspecified angina pectoris   . DOE (dyspnea on exertion)   . Pneumonia   . DM type 2 (diabetes mellitus, type 2) (HCC)     Poor control on max oral meds--pt eventually agreed to insulin therap  . CAD, multiple vessel     3V CAD cath 08/08/13----CABG done shortly after.  . Chronic renal insufficiency, stage II (mild)   . Diabetic nephropathy (HCC)     Elevated urine microalb/cr 03/2011  . Hearing loss of both ears 2016    Hearing aids    Consultants: Heart failure team is following  Procedures:  Echocardiogram is  pending  Antibiotics: Doxycycline  Subjective: Patient about the same as last night. Perhaps slightly better. Denies any chest pain. Still wheezing. No nausea, vomiting. His family is at bedside.  Objective: Vital Signs  Filed Vitals:   10/29/14 2224 10/30/14 0356 10/30/14 0447 10/30/14 0746  BP:   135/51   Pulse:   71   Temp:   98.5 F (36.9 C)   TempSrc:   Oral   Resp:   20   Height:      Weight:  110.496 kg (243 lb 9.6 oz)    SpO2: 93%  93% 93%    Intake/Output Summary (Last 24 hours) at 10/30/14 0819 Last data filed at 10/30/14 0731  Gross per 24 hour  Intake    940 ml  Output   3000 ml  Net  -2060 ml   Filed Weights   10/29/14 1157 10/29/14 1625 10/30/14 0356  Weight: 97.523 kg (215 lb) 112.038 kg (247 lb) 110.496 kg (243 lb 9.6 oz)    General appearance: alert, cooperative, appears stated age and no distress Resp: Diffuse wheezing bilaterally. No crackles. No rhonchi. Cardio: regular rate and rhythm, S1, S2 normal, no murmur, click, rub or gallop GI: soft, non-tender; bowel sounds normal; no masses,  no organomegaly Extremities: extremities normal, atraumatic, no cyanosis or edema Neurologic: No focal deficits.  Lab Results:  Basic Metabolic Panel:  Recent Labs Lab 10/29/14 1215 10/29/14 1858 10/30/14 0506  NA  139  --  136  K 4.2  --  4.6  CL 108  --  99*  CO2 26  --  26  GLUCOSE 178*  --  378*  BUN 18  --  21*  CREATININE 0.96 1.14 0.93  CALCIUM 8.5*  --  9.3   Liver Function Tests:  Recent Labs Lab 10/29/14 1215  AST 20  ALT 14*  ALKPHOS 63  BILITOT 0.5  PROT 6.3*  ALBUMIN 3.4*   CBC:  Recent Labs Lab 10/29/14 1215 10/29/14 1858 10/30/14 0506  WBC 7.5 7.8 6.5  HGB 11.4* 11.3* 11.7*  HCT 37.4* 37.4* 38.4*  MCV 85.8 85.4 85.3  PLT 169 151 158   Cardiac Enzymes:  Recent Labs Lab 10/29/14 1858  TROPONINI <0.03   BNP (last 3 results)  Recent Labs  10/29/14 1215  BNP 402.0*   CBG:  Recent Labs Lab 10/29/14 2108  10/30/14 0555  GLUCAP 382* 316*    No results found for this or any previous visit (from the past 240 hour(s)).    Studies/Results: Dg Chest 2 View  10/29/2014  CLINICAL DATA:  Shortness of breath. History of cardiac and pulmonary issues. EXAM: CHEST  2 VIEW COMPARISON:  09/19/2013 FINDINGS: Previous median sternotomy and CABG procedure. Small bilateral pleural effusions and mild interstitial edema noted. Aortic atherosclerosis identified. No airspace consolidation. IMPRESSION: 1. Mild CHF. Electronically Signed   By: Kerby Moors M.D.   On: 10/29/2014 14:07    Medications:  Scheduled: . amLODipine  5 mg Oral Daily  . antiseptic oral rinse  7 mL Mouth Rinse BID  . aspirin EC  81 mg Oral Daily  . budesonide-formoterol  2 puff Inhalation BID  . buPROPion  150 mg Oral BID  . cholecalciferol  1,000 Units Oral Daily  . citalopram  40 mg Oral Daily  . doxycycline  100 mg Oral BID  . enoxaparin (LOVENOX) injection  40 mg Subcutaneous Q24H  . furosemide  40 mg Intravenous BID  . insulin aspart  0-20 Units Subcutaneous TID WC  . insulin aspart  0-5 Units Subcutaneous QHS  . insulin aspart  6 Units Subcutaneous TID WC  . insulin detemir  30 Units Subcutaneous QHS  . ipratropium  0.5 mg Nebulization Q6H  . isosorbide mononitrate  30 mg Oral Daily  . levalbuterol  0.63 mg Nebulization Q6H  . losartan  50 mg Oral Daily   And  . [START ON 10/31/2014] losartan  100 mg Oral Daily  . methylPREDNISolone (SOLU-MEDROL) injection  60 mg Intravenous TID  . multivitamin with minerals  1 tablet Oral Daily  . pioglitazone  45 mg Oral Daily  . pravastatin  20 mg Oral q1800  . sodium chloride  3 mL Intravenous Q12H  . tamsulosin  0.4 mg Oral QPC supper   Continuous:  BMW:[id number] **OR** acetaminophen, alum & mag hydroxide-simeth, guaiFENesin-dextromethorphan, hydrALAZINE, levalbuterol, ondansetron **OR** ondansetron (ZOFRAN) IV, polyethylene glycol  Assessment/Plan:  Principal Problem:    Acute on chronic respiratory failure (HCC) Active Problems:   HTN (hypertension), benign   Hyperlipidemia   COPD (chronic obstructive pulmonary disease) (HCC)   Pericardial effusion with cardiac tamponade- s/p perciocentesis 06/30/13   PAF (paroxysmal atrial fibrillation) (HCC)   Coronary artery disease- 3 V at cath 08/08/13   Diabetes mellitus with complication (HCC)   History of asbestosis   Respiratory failure with hypoxia (HCC)   Acute on chronic respiratory failure with hypoxia (HCC)   COPD exacerbation (HCC)    Acute respiratory  failure with hypoxia secondary to COPD exacerbation Continue management as outlined for COPD exacerbation. Continue oxygen to maintain sats around 90%.  Acute COPD exacerbation Patient continues to smoke cigarettes. He also has a history of asbestosis exposure. Nebulizers scheduled and as needed. Continue antibiotics and steroids as well.  History of pericardial effusion Last echocardiogram was from a year ago. Heart size does appear to be more enlarged on chest x-ray compared to before. Repeat another echocardiogram.  History of coronary artery disease status post CABG in 2015 Stable. Continue to monitor  History of chronic diastolic CHF Continue Lasix. Strict ins and outs. Daily weights.  History of type 2 diabetes Anticipate worsening in glycemic control due to steroids. Continue Lantus and sliding scale coverage. Increase to resistant scale. HbA1c is pending.  History of dyslipidemia Continue statin  History of essential hypertension Stable. Continue medications  Tobacco abuse Counseled to quit smoking.  DVT Prophylaxis: Lovenox    Code Status: Full Code  Family Communication: Discussed with the patient and his family  Disposition Plan: Await improvement in symptoms. Mobilize.     LOS: 1 day   [facility] Pager [phone] 10/30/2014, 8:19 AM  If 7PM-7AM, please contact night-coverage at [email or web address],  password [facility]

## 2014-10-30 NOTE — Progress Notes (Signed)
Utilization review completed. Rozanna Boer, RN, BSN.

## 2014-10-30 NOTE — Telephone Encounter (Signed)
Will hold off on refills for now. Pt was admitted to hospital on 10/29/14 and changes to his medications has been made.

## 2014-10-30 NOTE — Progress Notes (Signed)
1035 diabetes education provided:  Pt able to verbalize knowledge of definition of diabetes , signs and symptoms of hypo and hyperglycemia .checking his blood sugar regimen and administration of apporpriate insulin as ordered and able demonstrate  Clean technique in providing self  insulin . Able to incorporate wife in his regimen . Able to speak how important diet , exercise  And med  and MD follow -up [place]

## 2014-10-30 NOTE — Telephone Encounter (Signed)
Pt admitted to hospital on 10/29/14, medications changes have been made. Will hold off on refilling until seen for f/u.

## 2014-10-30 NOTE — Progress Notes (Signed)
  Echocardiogram 2D Echocardiogram has been performed.  Joshua [name] 10/30/2014, 11:42 AM

## 2014-10-30 NOTE — Progress Notes (Signed)
Inpatient Diabetes Program Recommendations  AACE/ADA: New Consensus Statement on Inpatient Glycemic Control (2015)  Target Ranges:  Prepandial:   less than 140 mg/dL      Peak postprandial:   less than 180 mg/dL (1-2 hours)      Critically ill patients:  140 - 180 mg/dL   Review of Glycemic Control:  Results for Joshua [name], Joshua [name] (MRN [id number]) as of 10/30/2014 11:30  Ref. Range 10/29/2014 21:08 10/30/2014 05:55 10/30/2014 11:09  Glucose-Capillary Latest Ref Range: 65-99 mg/dL 382 (H) 316 (H) 288 (H)   Diabetes history: Diabetes Mellitus Outpatient Diabetes medications: Levemir 20-35 units q HS based on blood sugar readings, Humulin R-8 units tid with meals,  Metformin 1000 mg bid, Actos 45 mg daily Current orders for Inpatient glycemic control:  Levemir 30 units daily, Novolog resistant tid with meals and HS, Novolog 6 units tid with meals  Inpatient Diabetes Program Recommendations:   Note increased in insulin today.  May consider increasing Novolog resistant correction to q 4 hours while patient is on IV Solumedrol.  Will follow.  Thanks, Adah Perl, RN, BC-ADM Inpatient Diabetes Coordinator Pager [phone] (8a-5p)

## 2014-10-31 ENCOUNTER — Encounter: Payer: Self-pay | Admitting: Family Medicine

## 2014-10-31 DIAGNOSIS — I319 Disease of pericardium, unspecified: Secondary | ICD-10-CM

## 2014-10-31 DIAGNOSIS — E118 Type 2 diabetes mellitus with unspecified complications: Secondary | ICD-10-CM

## 2014-10-31 DIAGNOSIS — R05 Cough: Secondary | ICD-10-CM

## 2014-10-31 DIAGNOSIS — I314 Cardiac tamponade: Secondary | ICD-10-CM

## 2014-10-31 DIAGNOSIS — I2584 Coronary atherosclerosis due to calcified coronary lesion: Secondary | ICD-10-CM

## 2014-10-31 DIAGNOSIS — E785 Hyperlipidemia, unspecified: Secondary | ICD-10-CM

## 2014-10-31 DIAGNOSIS — I480 Paroxysmal atrial fibrillation: Secondary | ICD-10-CM

## 2014-10-31 DIAGNOSIS — I10 Essential (primary) hypertension: Secondary | ICD-10-CM

## 2014-10-31 DIAGNOSIS — I2510 Atherosclerotic heart disease of native coronary artery without angina pectoris: Secondary | ICD-10-CM

## 2014-10-31 DIAGNOSIS — Z8709 Personal history of other diseases of the respiratory system: Secondary | ICD-10-CM

## 2014-10-31 DIAGNOSIS — J42 Unspecified chronic bronchitis: Secondary | ICD-10-CM

## 2014-10-31 LAB — GLUCOSE, CAPILLARY
GLUCOSE-CAPILLARY: 308 mg/dL — AB (ref 65–99)
GLUCOSE-CAPILLARY: 246 mg/dL — AB (ref 65–99)
Glucose-Capillary: 253 mg/dL — ABNORMAL HIGH (ref 65–99)
Glucose-Capillary: 255 mg/dL — ABNORMAL HIGH (ref 65–99)

## 2014-10-31 LAB — BASIC METABOLIC PANEL
Anion gap: 11 (ref 5–15)
BUN: 30 mg/dL — AB (ref 6–20)
CALCIUM: 9 mg/dL (ref 8.9–10.3)
CHLORIDE: 96 mmol/L — AB (ref 101–111)
CO2: 26 mmol/L (ref 22–32)
CREATININE: 0.97 mg/dL (ref 0.61–1.24)
GFR calc non Af Amer: >60 mL/min (ref >60)
Glucose, Bld: 329 mg/dL — ABNORMAL HIGH (ref 65–99)
Potassium: 4.7 mmol/L (ref 3.5–5.1)
SODIUM: 133 mmol/L — AB (ref 135–145)

## 2014-10-31 LAB — CBC
HCT: 38 % — ABNORMAL LOW (ref 39.0–52.0)
HEMOGLOBIN: 11.3 g/dL — AB (ref 13.0–17.0)
MCH: 25.6 pg — AB (ref 26.0–34.0)
MCHC: 29.7 g/dL — AB (ref 30.0–36.0)
MCV: 86.2 fL (ref 78.0–100.0)
PLATELETS: 161 10*3/uL (ref 150–400)
RBC: 4.41 MIL/uL (ref 4.22–5.81)
RDW: 15.5 % (ref 11.5–15.5)
WBC: 8.5 10*3/uL (ref 4.0–10.5)

## 2014-10-31 MED ORDER — GUAIFENESIN ER 600 MG PO TB12
1200.0000 mg | ORAL_TABLET | Freq: Two times a day (BID) | ORAL | Status: DC
Start: 2014-10-31 — End: 2014-11-02
  Administered 2014-10-31 – 2014-11-02 (×5): 1200 mg via ORAL
  Filled 2014-10-31 (×5): qty 2

## 2014-10-31 MED ORDER — FUROSEMIDE 10 MG/ML IJ SOLN
80.0000 mg | Freq: Two times a day (BID) | INTRAMUSCULAR | Status: DC
  Administered 2014-10-31 – 2014-11-01 (×2): 80 mg via INTRAVENOUS
  Filled 2014-10-31 (×2): qty 8

## 2014-10-31 MED ORDER — LEVOFLOXACIN 750 MG PO TABS
750.0000 mg | ORAL_TABLET | Freq: Every day | ORAL | Status: DC
  Administered 2014-10-31 – 2014-11-02 (×3): 750 mg via ORAL
  Filled 2014-10-31 (×3): qty 1

## 2014-10-31 NOTE — Progress Notes (Signed)
Advanced Heart Failure Rounding Note  Primary Physician: Dr Shawnie Dapper Primary Cardiologist: Dr. Irish Lack Primary HF: New (Bensimhom)  Subjective:    Breathing better but still feels congested. Says he continues to feel a little better every day.  Out 0.5 L thought weight up 2 lbs on Lasix 40 mg IV BID. Creatinine stable.  Objective:   Weight Range: 245 lb 4.8 oz (111.267 kg) Body mass index is 34.23 kg/(m^2).   Vital Signs:   Temp:  [97.8 F (36.6 C)-98.3 F (36.8 C)] 98.1 F (36.7 C) (10/26 0524) Pulse Rate:  [66-70] 69 (10/26 0524) Resp:  [20] 20 (10/26 0524) BP: (109-145)/(36-61) 145/61 mmHg (10/26 0634) SpO2:  [93 %-98 %] 98 % (10/26 0524) Weight:  [245 lb 4.8 oz (111.267 kg)] 245 lb 4.8 oz (111.267 kg) (10/26 0320) Last BM Date: 10/28/14  Weight change: Filed Weights   10/29/14 1625 10/30/14 0356 10/31/14 0320  Weight: 247 lb (112.038 kg) 243 lb 9.6 oz (110.496 kg) 245 lb 4.8 oz (111.267 kg)    Intake/Output:   Intake/Output Summary (Last 24 hours) at 10/31/14 0912 Last data filed at 10/31/14 0320  Gross per 24 hour  Intake    720 ml  Output   1600 ml  Net   -880 ml     Physical Exam: General: Elderly appearing.  HEENT: normal Neck: supple. JVP to jaw. Carotids 2+ bilat; no bruits. No lymphadenopathy or thyromegaly noted. Cor: PMI nondisplaced. RRR. No rubs, gallops or murmurs appreciated. Lungs: Diminished bibasilarly, much improved. Abdomen: Obese, soft, NT, ND No HSM. No bruits or masses. +BS Extremities: no cyanosis, clubbing, rash. 1+ ankle edema. Neuro: alert & orientedx3, cranial nerves grossly intact. moves all 4 extremities w/o difficulty. Affect pleasant  Telemetry: NSR 70s  Labs: CBC  Recent Labs  10/30/14 0506 10/31/14 0256  WBC 6.5 8.5  HGB 11.7* 11.3*  HCT 38.4* 38.0*  MCV 85.3 86.2  PLT 158 818   Basic Metabolic Panel  Recent Labs  10/30/14 0506 10/31/14 0256  NA 136 133*  K 4.6 4.7  CL 99* 96*  CO2 26 26   GLUCOSE 378* 329*  BUN 21* 30*  CREATININE 0.93 0.97  CALCIUM 9.3 9.0   Liver Function Tests  Recent Labs  10/29/14 1215  AST 20  ALT 14*  ALKPHOS 63  BILITOT 0.5  PROT 6.3*  ALBUMIN 3.4*   No results for input(s): LIPASE, AMYLASE in the last 72 hours. Cardiac Enzymes  Recent Labs  10/29/14 1858  TROPONINI <0.03    BNP: BNP (last 3 results)  Recent Labs  10/29/14 1215  BNP 402.0*    ProBNP (last 3 results) No results for input(s): PROBNP in the last 8760 hours.   D-Dimer No results for input(s): DDIMER in the last 72 hours. Hemoglobin A1C  Recent Labs  10/29/14 1858  HGBA1C 7.7*   Fasting Lipid Panel No results for input(s): CHOL, HDL, LDLCALC, TRIG, CHOLHDL, LDLDIRECT in the last 72 hours. Thyroid Function Tests No results for input(s): TSH, T4TOTAL, T3FREE, THYROIDAB in the last 72 hours.  Invalid input(s): FREET3  Other results:     Imaging/Studies:  Dg Chest 2 View  10/29/2014  CLINICAL DATA:  Shortness of breath. History of cardiac and pulmonary issues. EXAM: CHEST  2 VIEW COMPARISON:  09/19/2013 FINDINGS: Previous median sternotomy and CABG procedure. Small bilateral pleural effusions and mild interstitial edema noted. Aortic atherosclerosis identified. No airspace consolidation. IMPRESSION: 1. Mild CHF. Electronically Signed   By: Queen Slough.D.  On: 10/29/2014 14:07     Latest Echo  Latest Cath   Medications:     Scheduled Medications: . amLODipine  5 mg Oral Daily  . antiseptic oral rinse  7 mL Mouth Rinse BID  . aspirin EC  81 mg Oral Daily  . budesonide-formoterol  2 puff Inhalation BID  . buPROPion  150 mg Oral BID  . cholecalciferol  1,000 Units Oral Daily  . citalopram  40 mg Oral Daily  . doxycycline  100 mg Oral BID  . enoxaparin (LOVENOX) injection  40 mg Subcutaneous Q24H  . furosemide  40 mg Intravenous BID  . insulin aspart  0-20 Units Subcutaneous TID WC  . insulin aspart  0-5 Units Subcutaneous QHS   . insulin aspart  6 Units Subcutaneous TID WC  . insulin detemir  30 Units Subcutaneous QHS  . ipratropium  0.5 mg Nebulization Q6H  . isosorbide mononitrate  30 mg Oral Daily  . levalbuterol  0.63 mg Nebulization Q6H  . losartan  50 mg Oral Daily   And  . losartan  100 mg Oral Daily  . methylPREDNISolone (SOLU-MEDROL) injection  60 mg Intravenous TID  . multivitamin with minerals  1 tablet Oral Daily  . pioglitazone  45 mg Oral Daily  . pravastatin  20 mg Oral q1800  . sodium chloride  3 mL Intravenous Q12H  . tamsulosin  0.4 mg Oral QPC supper     Infusions:     PRN Medications:  acetaminophen **OR** acetaminophen, alum & mag hydroxide-simeth, guaiFENesin-dextromethorphan, hydrALAZINE, levalbuterol, ondansetron **OR** ondansetron (ZOFRAN) IV, polyethylene glycol   Assessment   1. Acute on chronic resp failure c COPD 2. Diastolic CHF - 50/38/88 EF 60-65% 3. Paroxysmal AF - CHA2DS2-VASc at least 4. 4. HTN  5. Tobacco Abuse 6. DM2 - per primary  Plan   ECHO 10/30/14 LVEF 60-65% with severe LAH, Peak PA pressure 38 mm Hg, normal RV  BP remains poorly controlled on losartan 100 mg am 50 mg pm. Amlodopine 5 mg not given yesterday, Will make sure he gets today. Has orders for hydralazine 10 mg prn for SBPs >160.  Hold BB with active wheezing. Consider switch to bisoprolol on resume.  Only out 0.5 L and up 2 lbs over night on 40 mg IV lasix. Increase to 80 mg IV BID. COPD per primary team.   Stressed the importance of tobacco cessation. Has failed with wellbutrin in the past. Stopped for several months after CABG but started again 2/2 stress. Smoked 1/2 ppd up until this admission..  Length of Stay: 2   Shirley Friar PA-C 10/31/2014, 9:12 AM  Advanced Heart Failure Team Pager [phone] (M-F; 7a - 4p)  Please contact [facility] for night-coverage after hours (4p -7a ) and weekends on [email or web address]   Patient seen and examined with Oda Kilts, PA-C.  We discussed all aspects of the encounter. I agree with the assessment and plan as stated above.   Improving slowly. Still mildly volume overloaded. BP up. Continue IV lasix one more day. Increase amlodipine. Will start to ambulate.   Trigger Frasier,MD 11:30 AM

## 2014-10-31 NOTE — Progress Notes (Signed)
PT Cancellation Note  Patient Details Name: JORDON KRISTIANSEN MRN: [id number] DOB: 12/03/1940   Cancelled Treatment:    Reason Eval/Treat Not Completed: Patient at procedure or test/unavailable Pt getting a wash up at the moment. Will follow up next available time.   Marguarite Arbour A Mardel Grudzien 10/31/2014, 3:34 PM  Wray Kearns, [place], DPT [phone]

## 2014-10-31 NOTE — Progress Notes (Addendum)
Inpatient Diabetes Program Recommendations  AACE/ADA: New Consensus Statement on Inpatient Glycemic Control (2015)  Target Ranges:  Prepandial:   less than 140 mg/dL      Peak postprandial:   less than 180 mg/dL (1-2 hours)      Critically ill patients:  140 - 180 mg/dL   Review of Glycemic Control  Results for Joshua [name], Joshua [name] (MRN [id number]) as of 10/31/2014 14:31  Ref. Range 10/30/2014 11:09 10/30/2014 16:03 10/30/2014 21:02 10/31/2014 05:50 10/31/2014 11:08  Glucose-Capillary Latest Ref Range: 65-99 mg/dL 288 (H) 301 (H) 192 (H) 308 (H) 253 (H)   Inpatient Diabetes Program Recommendations: While on Solumedrol, please consider the following:  In addition to the resistant correction tidwc and HS scale:  Insulin - Basal: Please increase basal levemir to 40 units and give 1/2 dose in the am and 1/2 dose in the pm-20 units in the am and 20 units at HS.(Levemir is noted to work more efficiently as a twice a day dosing especially once dose is greater than 10 units/day) Insulin - Meal Coverage: Please increase meal coverag to 8 units tidwc   Thank you Rosita Kea, RN, MSN, CDE  Diabetes Inpatient Program Office: [phone] Pager: [phone] 8:00 am to 5:00 pm

## 2014-10-31 NOTE — Progress Notes (Signed)
Pt a/o, no c/o pain, pt HOH, pt amb in hallway 200 ft with 3L O2 tolerated well, VSS, pt stable

## 2014-10-31 NOTE — Progress Notes (Signed)
[facility] PROGRESS NOTE  ERVEN RAMSON EQA:[id number] DOB: 03-26-1940 DOA: 10/29/2014  PCP: Tammi Sou, MD  Brief HPI: 74 year old Caucasian male with a past medical history of COPD, coronary artery disease status post CABG in 2015, history of pericardial effusion presented with 4 day history of productive cough, shortness of breath with wheezing. He was hospitalized for further management.  Subjective: Patient about the same as last night. Perhaps slightly better. Denies any chest pain. Still wheezing. No nausea, vomiting. His family is at bedside.   Assessment/Plan: Principal Problem:   Acute on chronic respiratory failure (HCC) Active Problems:   HTN (hypertension), benign   Hyperlipidemia   COPD (chronic obstructive pulmonary disease) (HCC)   Pericardial effusion with cardiac tamponade- s/p perciocentesis 06/30/13   PAF (paroxysmal atrial fibrillation) (HCC)   Coronary artery disease- 3 V at cath 08/08/13   Diabetes mellitus with complication (HCC)   History of asbestosis   Respiratory failure with hypoxia (HCC)   Acute on chronic respiratory failure with hypoxia (HCC)   COPD exacerbation (HCC)   Acute respiratory failure with hypoxia secondary to COPD exacerbation Presented with oxygen saturation of 87% on room air. Continue management as outlined for COPD exacerbation. Continue oxygen to maintain sats around 90%.  Acute COPD exacerbation Patient continues to smoke cigarettes. 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  Started on IV antibiotics and IV steroids. Scheduled bronchodilators, oxygen, antitussives and mucolytics.  History of pericardial effusion Last echocardiogram was from a year ago. Heart size does appear to be more enlarged on chest x-ray compared to before. Repeat another echocardiogram.  History of coronary artery disease status post CABG in 2015 Stable. Continue to monitor  History of chronic diastolic CHF Continue Lasix. Strict ins  and outs. Daily weights.  History of type 2 diabetes Anticipate worsening in glycemic control due to steroids. Continue Lantus and sliding scale coverage. Increase to resistant scale. HbA1c is pending.  History of dyslipidemia Continue statin  History of essential hypertension Stable. Continue medications  Tobacco abuse Counseled to quit smoking.  DVT Prophylaxis: Lovenox    Code Status: Full Code  Family Communication: Discussed with the patient and his family  Disposition Plan: Await improvement in symptoms. Mobilize.  Past medical history:  Past Medical History  Diagnosis Date  . Hypertension   . Tobacco dependence in remission     100+ pack-yr hx: quit after CABG  . Obesity   . Hyperplastic colon polyp 2001  . Erectile dysfunction     Normal testosterone  . Adenomatous colon polyp 10/16/2011    Repeat 2018  . 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 pylori neg, no dysplasia or malignancy.  . Macular degeneration, dry     Mild, bilat (Optometrist, Mayford Knife at [facility] [facility] in [place], [place])  . Iron deficiency anemia 2014    03/2012 capsule endoscopy showed 2 AVMs--likely responsible for his IDA--lifetime iron supp recommended + q98moCBCs.  . 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 COPD [place] obstructive [place] disease[place]     Spirometry  2004 borderline obstruction  . Hyperlipidemia   . 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 Other and unspecified angina pectoris   . DOE (dyspnea on exertion)   . Pneumonia   . DM type 2 (diabetes mellitus, type 2) (HCC)     Poor control on max oral meds--pt  eventually agreed to insulin therap  . CAD, multiple vessel     3V CAD cath 08/08/13----CABG done shortly after.  . Chronic renal insufficiency, stage II (mild)   . Diabetic nephropathy (HCC)     Elevated urine microalb/cr 03/2011  . Hearing loss of  both ears 2016    Hearing aids    Consultants: Heart failure team is following  Procedures:  Echocardiogram is pending  Antibiotics: Doxycycline   Objective: Vital Signs  Filed Vitals:   10/31/14 0634 10/31/14 0900 10/31/14 0939 10/31/14 1246  BP: 145/61 128/48  127/39  Pulse:  74  72  Temp:  97.9 F (36.6 C)  97.7 F (36.5 C)  TempSrc:  Oral  Oral  Resp:  20  20  Height:      Weight:      SpO2:  98% 96% 95%    Intake/Output Summary (Last 24 hours) at 10/31/14 1251 Last data filed at 10/31/14 1248  Gross per 24 hour  Intake    960 ml  Output   3800 ml  Net  -2840 ml   Filed Weights   10/29/14 1625 10/30/14 0356 10/31/14 0320  Weight: 112.038 kg (247 lb) 110.496 kg (243 lb 9.6 oz) 111.267 kg (245 lb 4.8 oz)    General appearance: alert, cooperative, appears stated age and no distress Resp: Diffuse wheezing bilaterally. No crackles. No rhonchi. Cardio: regular rate and rhythm, S1, S2 normal, no murmur, click, rub or gallop GI: soft, non-tender; bowel sounds normal; no masses,  no organomegaly Extremities: extremities normal, atraumatic, no cyanosis or edema Neurologic: No focal deficits.  Lab Results:  Basic Metabolic Panel:  Recent Labs Lab 10/29/14 1215 10/29/14 1858 10/30/14 0506 10/31/14 0256  NA 139  --  136 133*  K 4.2  --  4.6 4.7  CL 108  --  99* 96*  CO2 26  --  26 26  GLUCOSE 178*  --  378* 329*  BUN 18  --  21* 30*  CREATININE 0.96 1.14 0.93 0.97  CALCIUM 8.5*  --  9.3 9.0   Liver Function Tests:  Recent Labs Lab 10/29/14 1215  AST 20  ALT 14*  ALKPHOS 63  BILITOT 0.5  PROT 6.3*  ALBUMIN 3.4*   CBC:  Recent Labs Lab 10/29/14 1215 10/29/14 1858 10/30/14 0506 10/31/14 0256  WBC 7.5 7.8 6.5 8.5  HGB 11.4* 11.3* 11.7* 11.3*  HCT 37.4* 37.4* 38.4* 38.0*  MCV 85.8 85.4 85.3 86.2  PLT 169 151 158 161   Cardiac Enzymes:  Recent Labs Lab 10/29/14 1858  TROPONINI <0.03   BNP (last 3 results)  Recent Labs   10/29/14 1215  BNP 402.0*   CBG:  Recent Labs Lab 10/30/14 1109 10/30/14 1603 10/30/14 2102 10/31/14 0550 10/31/14 1108  GLUCAP 288* 301* 192* 308* 253*    No results found for this or any previous visit (from the past 240 hour(s)).    Studies/Results: No results found.  Medications:  Scheduled: . amLODipine  5 mg Oral Daily  . antiseptic oral rinse  7 mL Mouth Rinse BID  . aspirin EC  81 mg Oral Daily  . budesonide-formoterol  2 puff Inhalation BID  . buPROPion  150 mg Oral BID  . cholecalciferol  1,000 Units Oral Daily  . citalopram  40 mg Oral Daily  . doxycycline  100 mg Oral BID  . enoxaparin (LOVENOX) injection  40 mg Subcutaneous Q24H  . furosemide  80 mg Intravenous BID  . insulin aspart  0-20 Units Subcutaneous TID WC  . insulin aspart  0-5 Units Subcutaneous QHS  . insulin aspart  6 Units Subcutaneous TID WC  . insulin detemir  30 Units Subcutaneous QHS  . ipratropium  0.5 mg Nebulization Q6H  . isosorbide mononitrate  30 mg Oral Daily  . levalbuterol  0.63 mg Nebulization Q6H  . losartan  50 mg Oral Daily   And  . losartan  100 mg Oral Daily  . methylPREDNISolone (SOLU-MEDROL) injection  60 mg Intravenous TID  . multivitamin with minerals  1 tablet Oral Daily  . pioglitazone  45 mg Oral Daily  . pravastatin  20 mg Oral q1800  . sodium chloride  3 mL Intravenous Q12H  . tamsulosin  0.4 mg Oral QPC supper   Continuous:  UWC:[id number] **OR** acetaminophen, alum & mag hydroxide-simeth, guaiFENesin-dextromethorphan, hydrALAZINE, levalbuterol, ondansetron **OR** ondansetron (ZOFRAN) IV, polyethylene glycol     LOS: 2 days   [facility] A  [facility] Pager [phone]  10/31/2014, 12:51 PM  If 7PM-7AM, please contact night-coverage at [email or web address], password [facility]

## 2014-11-01 DIAGNOSIS — J441 Chronic obstructive pulmonary disease with (acute) exacerbation: Secondary | ICD-10-CM

## 2014-11-01 LAB — CBC
HCT: 39.7 % (ref 39.0–52.0)
HEMOGLOBIN: 12.5 g/dL — AB (ref 13.0–17.0)
MCH: 26.7 pg (ref 26.0–34.0)
MCHC: 31.5 g/dL (ref 30.0–36.0)
MCV: 84.8 fL (ref 78.0–100.0)
Platelets: 94 10*3/uL — ABNORMAL LOW (ref 150–400)
RBC: 4.68 MIL/uL (ref 4.22–5.81)
RDW: 15.6 % — ABNORMAL HIGH (ref 11.5–15.5)
WBC: 7.8 10*3/uL (ref 4.0–10.5)

## 2014-11-01 LAB — GLUCOSE, CAPILLARY
GLUCOSE-CAPILLARY: 429 mg/dL — AB (ref 65–99)
GLUCOSE-CAPILLARY: 184 mg/dL — AB (ref 65–99)
GLUCOSE-CAPILLARY: 219 mg/dL — AB (ref 65–99)
Glucose-Capillary: 357 mg/dL — ABNORMAL HIGH (ref 65–99)
Glucose-Capillary: 210 mg/dL — ABNORMAL HIGH (ref 65–99)

## 2014-11-01 LAB — RENAL FUNCTION PANEL
ANION GAP: 10 (ref 5–15)
Albumin: 3.1 g/dL — ABNORMAL LOW (ref 3.5–5.0)
BUN: 31 mg/dL — ABNORMAL HIGH (ref 6–20)
CALCIUM: 8.8 mg/dL — AB (ref 8.9–10.3)
CO2: 28 mmol/L (ref 22–32)
CREATININE: 0.99 mg/dL (ref 0.61–1.24)
Chloride: 96 mmol/L — ABNORMAL LOW (ref 101–111)
GFR calc Af Amer: >60 mL/min (ref >60)
GFR calc non Af Amer: >60 mL/min (ref >60)
GLUCOSE: 312 mg/dL — AB (ref 65–99)
Phosphorus: 4.1 mg/dL (ref 2.5–4.6)
Potassium: 4.3 mmol/L (ref 3.5–5.1)
SODIUM: 134 mmol/L — AB (ref 135–145)

## 2014-11-01 LAB — BASIC METABOLIC PANEL
Anion gap: 10 (ref 5–15)
BUN: 30 mg/dL — ABNORMAL HIGH (ref 6–20)
CHLORIDE: 93 mmol/L — AB (ref 101–111)
CO2: 29 mmol/L (ref 22–32)
CREATININE: 0.99 mg/dL (ref 0.61–1.24)
Calcium: 8.7 mg/dL — ABNORMAL LOW (ref 8.9–10.3)
GFR calc Af Amer: >60 mL/min (ref >60)
GFR calc non Af Amer: >60 mL/min (ref >60)
Glucose, Bld: 315 mg/dL — ABNORMAL HIGH (ref 65–99)
Potassium: 4.2 mmol/L (ref 3.5–5.1)
SODIUM: 132 mmol/L — AB (ref 135–145)

## 2014-11-01 LAB — GLUCOSE, RANDOM: Glucose, Bld: 386 mg/dL — ABNORMAL HIGH (ref 65–99)

## 2014-11-01 MED ORDER — AMLODIPINE BESYLATE 10 MG PO TABS
10.0000 mg | ORAL_TABLET | Freq: Every day | ORAL | Status: DC
  Administered 2014-11-01 – 2014-11-02 (×2): 10 mg via ORAL
  Filled 2014-11-01 (×2): qty 1

## 2014-11-01 MED ORDER — LEVALBUTEROL HCL 0.63 MG/3ML IN NEBU
0.6300 mg | INHALATION_SOLUTION | Freq: Three times a day (TID) | RESPIRATORY_TRACT | Status: DC
  Administered 2014-11-02 (×2): 0.63 mg via RESPIRATORY_TRACT
  Filled 2014-11-01 (×2): qty 3

## 2014-11-01 MED ORDER — IPRATROPIUM BROMIDE 0.02 % IN SOLN
0.5000 mg | Freq: Three times a day (TID) | RESPIRATORY_TRACT | Status: DC
  Administered 2014-11-02 (×2): 0.5 mg via RESPIRATORY_TRACT
  Filled 2014-11-01 (×2): qty 2.5

## 2014-11-01 MED ORDER — INSULIN DETEMIR 100 UNIT/ML ~~LOC~~ SOLN
20.0000 [IU] | Freq: Two times a day (BID) | SUBCUTANEOUS | Status: DC
  Administered 2014-11-01 – 2014-11-02 (×3): 20 [IU] via SUBCUTANEOUS
  Filled 2014-11-01 (×4): qty 0.2

## 2014-11-01 MED ORDER — PREDNISONE 50 MG PO TABS
60.0000 mg | ORAL_TABLET | Freq: Every day | ORAL | Status: DC
  Administered 2014-11-01: 60 mg via ORAL
  Filled 2014-11-01 (×2): qty 1

## 2014-11-01 MED ORDER — INSULIN ASPART 100 UNIT/ML ~~LOC~~ SOLN
10.0000 [IU] | Freq: Three times a day (TID) | SUBCUTANEOUS | Status: DC
  Administered 2014-11-01 (×3): 10 [IU] via SUBCUTANEOUS

## 2014-11-01 MED ORDER — FUROSEMIDE 20 MG PO TABS
20.0000 mg | ORAL_TABLET | Freq: Two times a day (BID) | ORAL | Status: DC
  Administered 2014-11-01 – 2014-11-02 (×2): 20 mg via ORAL
  Filled 2014-11-01 (×2): qty 1

## 2014-11-01 NOTE — Progress Notes (Signed)
Will report off to day nurse to recheck blood sugar around breakfast time to see effectiveness of insulin. Nurse signing off at this time.

## 2014-11-01 NOTE — Progress Notes (Signed)
SATURATION QUALIFICATIONS: (This note is used to comply with regulatory documentation for home oxygen)  Patient Saturations on Room Air at Rest = 95%  Patient Saturations on Room Air while Ambulating = 87-95% (only briefly below 90%- less than 90 seconds whil walking 300 feet)  Royce Sciara L. Tamala Julian, [place] Pager [phone] 11/01/2014

## 2014-11-01 NOTE — Evaluation (Signed)
Physical Therapy Evaluation Patient Details Name: Joshua [name] MRN: [id number] DOB: 24-Mar-1940 Today's Date: 11/01/2014   History of Present Illness  Patient presents with at least 4-5 day history of shortness of breath, dyspnea and coughing. PMH includes hip replacement, arthritis, and CABG.  Clinical Impression  Pt admitted with above diagnosis. Pt currently with functional limitations due to the deficits listed below (see PT Problem List).  Pt will benefit from skilled PT to increase their independence and safety with mobility to allow discharge to the venue listed below. Pt moving well overall and has all DME at home and does not need any post cture PT.  Will follow acutely to maximize his independence prior to dc home.      Follow Up Recommendations No PT follow up    Equipment Recommendations  None recommended by PT    Recommendations for Other Services       Precautions / Restrictions Precautions Precautions: Other (comment) Precaution Comments: monitor o2 sats Restrictions Weight Bearing Restrictions: No      Mobility  Bed Mobility Overal bed mobility: Modified Independent             General bed mobility comments: Heavy use of bed rail, but no cues or A given  Transfers Overall transfer level: Modified independent                  Ambulation/Gait Ambulation/Gait assistance: Min guard;Supervision Ambulation Distance (Feet): 400 Feet Assistive device: None Gait Pattern/deviations: Step-through pattern;Antalgic     General Gait Details: Amb on RA with antalgic type gait due to arthritis. O2 dropped briefly below 90 to 87%, but mainly above 90% with some wheezing noted and 1 standing break.   Stairs            Wheelchair Mobility    Modified Rankin (Stroke Patients Only)       Balance Overall balance assessment: Needs assistance   Sitting balance-Leahy Scale: Good     Standing balance support: During functional activity Standing  balance-Leahy Scale: Good                               Pertinent Vitals/Pain Pain Assessment: No/denies pain    Home Living Family/patient expects to be discharged to:: Private residence Living Arrangements: Spouse/significant other Available Help at Discharge: Family Type of Home: House Home Access: Stairs to enter Entrance Stairs-Rails: Left Entrance Stairs-Number of Steps: 3 Home Layout: One level Home Equipment: Walker - 4 wheels;Cane - single point;Shower seat;Bedside commode;Grab bars - tub/shower;Hand held shower head      Prior Function Level of Independence: Independent with assistive device(s)         Comments: Amb with cane most of the time, but uses rollator for longer community distances.  Will ambulate throughout house without AD.     Hand Dominance   Dominant Hand: Right    Extremity/Trunk Assessment   Upper Extremity Assessment: Overall WFL for tasks assessed           Lower Extremity Assessment: Overall WFL for tasks assessed      Cervical / Trunk Assessment: Normal  Communication   Communication: HOH  Cognition Arousal/Alertness: Awake/alert Behavior During Therapy: WFL for tasks assessed/performed Overall Cognitive Status: Within Functional Limits for tasks assessed                      General Comments General comments (skin integrity, edema, etc.): Pt educated  on proper breathing techniques    Exercises        Assessment/Plan    PT Assessment Patient needs continued PT services  PT Diagnosis Difficulty walking   PT Problem List Cardiopulmonary status limiting activity;Decreased activity tolerance;Decreased balance;Decreased mobility  PT Treatment Interventions Gait training;Stair training;Functional mobility training;Therapeutic activities;Patient/family education   PT Goals (Current goals can be found in the Care Plan section) Acute Rehab PT Goals Patient Stated Goal: Home tomorrow PT Goal Formulation:  With patient Time For Goal Achievement: 11/08/14 Potential to Achieve Goals: Good    Frequency Min 3X/week   Barriers to discharge        Co-evaluation               End of Session Equipment Utilized During Treatment: Gait belt Activity Tolerance: Patient tolerated treatment well Patient left: in bed;with family/visitor present;with call bell/phone within reach Nurse Communication: Mobility status;Other (comment) (o2 sats)         Time: 4970-2637 PT Time Calculation (min) (ACUTE ONLY): 20 min   Charges:   PT Evaluation $Initial PT Evaluation Tier I: 1 Procedure     PT G Codes:        Brandis Matsuura LUBECK 11/01/2014, 9:34 AM

## 2014-11-01 NOTE — Care Management Important Message (Signed)
Important Message  Patient Details  Name: Joshua [name] MRN: [id number] Date of Birth: 1940-08-26   Medicare Important Message Given:  Yes-second notification given    Nathen May 11/01/2014, 10:35 AM

## 2014-11-01 NOTE — Progress Notes (Signed)
Advanced Heart Failure Rounding Note  Primary Physician: Dr Shawnie Dapper Primary Cardiologist: Dr. Irish Lack Primary HF: New (Bensimhom)  Subjective:    Feels good today, but still wheezing it.  Peed a lot yesterday.  Out 3.7 L weight down 4 lbs on Lasix 80 mg IV BID. Creatinine stable.  Objective:   Weight Range: 241 lb 0.6 oz (109.335 kg) Body mass index is 33.63 kg/(m^2).   Vital Signs:   Temp:  [97.6 F (36.4 C)-98.1 F (36.7 C)] 97.6 F (36.4 C) (10/27 0530) Pulse Rate:  [72-78] 78 (10/27 0530) Resp:  [20] 20 (10/27 0530) BP: (127-145)/(39-57) 145/57 mmHg (10/27 0530) SpO2:  [93 %-98 %] 95 % (10/27 0749) Weight:  [241 lb 0.6 oz (109.335 kg)] 241 lb 0.6 oz (109.335 kg) (10/27 0530) Last BM Date: 10/30/14  Weight change: Filed Weights   10/30/14 0356 10/31/14 0320 11/01/14 0530  Weight: 243 lb 9.6 oz (110.496 kg) 245 lb 4.8 oz (111.267 kg) 241 lb 0.6 oz (109.335 kg)    Intake/Output:   Intake/Output Summary (Last 24 hours) at 11/01/14 0827 Last data filed at 11/01/14 0741  Gross per 24 hour  Intake   1060 ml  Output   5375 ml  Net  -4315 ml     Physical Exam: General: Elderly appearing.  HEENT: normal Neck: supple. JVP 10. Carotids 2+ bilat; no bruits. No thyromegaly nodule noted. Cor: PMI nondisplaced. RRR. No M/G/R appreciated. Lungs: Slightly diminished bibasilar, scattered wheezing. Abdomen: Obese, soft, NT, ND No HSM. No bruits or masses. +BS Extremities: no cyanosis, clubbing, rash. Trace to 1+ ankle edema. Neuro: alert & orientedx3, cranial nerves grossly intact. moves all 4 extremities w/o difficulty. Affect pleasant  Telemetry: NSR 70s  Labs: CBC  Recent Labs  10/31/14 0256 11/01/14 0305  WBC 8.5 7.8  HGB 11.3* 12.5*  HCT 38.0* 39.7  MCV 86.2 84.8  PLT 161 94*   Basic Metabolic Panel  Recent Labs  11/01/14 0305 11/01/14 0325 11/01/14 0659  NA 132* 134*  --   K 4.2 4.3  --   CL 93* 96*  --   CO2 29 28  --   GLUCOSE 315*  312* 386*  BUN 30* 31*  --   CREATININE 0.99 0.99  --   CALCIUM 8.7* 8.8*  --   PHOS  --  4.1  --    Liver Function Tests  Recent Labs  10/29/14 1215 11/01/14 0325  AST 20  --   ALT 14*  --   ALKPHOS 63  --   BILITOT 0.5  --   PROT 6.3*  --   ALBUMIN 3.4* 3.1*   No results for input(s): LIPASE, AMYLASE in the last 72 hours. Cardiac Enzymes  Recent Labs  10/29/14 1858  TROPONINI <0.03    BNP: BNP (last 3 results)  Recent Labs  10/29/14 1215  BNP 402.0*    ProBNP (last 3 results) No results for input(s): PROBNP in the last 8760 hours.   D-Dimer No results for input(s): DDIMER in the last 72 hours. Hemoglobin A1C  Recent Labs  10/29/14 1858  HGBA1C 7.7*   Fasting Lipid Panel No results for input(s): CHOL, HDL, LDLCALC, TRIG, CHOLHDL, LDLDIRECT in the last 72 hours. Thyroid Function Tests No results for input(s): TSH, T4TOTAL, T3FREE, THYROIDAB in the last 72 hours.  Invalid input(s): FREET3    Imaging/Studies:  No results found.  Latest Echo  Latest Cath   Medications:     Scheduled Medications: . amLODipine  5  mg Oral Daily  . antiseptic oral rinse  7 mL Mouth Rinse BID  . aspirin EC  81 mg Oral Daily  . budesonide-formoterol  2 puff Inhalation BID  . buPROPion  150 mg Oral BID  . cholecalciferol  1,000 Units Oral Daily  . citalopram  40 mg Oral Daily  . enoxaparin (LOVENOX) injection  40 mg Subcutaneous Q24H  . furosemide  80 mg Intravenous BID  . guaiFENesin  1,200 mg Oral BID  . insulin aspart  0-20 Units Subcutaneous TID WC  . insulin aspart  10 Units Subcutaneous TID WC  . insulin detemir  20 Units Subcutaneous BID  . ipratropium  0.5 mg Nebulization Q6H  . isosorbide mononitrate  30 mg Oral Daily  . levalbuterol  0.63 mg Nebulization Q6H  . levofloxacin  750 mg Oral Daily  . losartan  50 mg Oral Daily   And  . losartan  100 mg Oral Daily  . multivitamin with minerals  1 tablet Oral Daily  . pioglitazone  45 mg Oral Daily   . pravastatin  20 mg Oral q1800  . predniSONE  60 mg Oral Q breakfast  . sodium chloride  3 mL Intravenous Q12H  . tamsulosin  0.4 mg Oral QPC supper    Infusions:    PRN Medications: acetaminophen **OR** acetaminophen, alum & mag hydroxide-simeth, guaiFENesin-dextromethorphan, hydrALAZINE, levalbuterol, ondansetron **OR** ondansetron (ZOFRAN) IV, polyethylene glycol   Assessment   1. Acute on chronic resp failure c COPD 2. Diastolic CHF - 45/85/92 EF 60-65% 3. Paroxysmal AF - CHA2DS2-VASc at least 4. 4. HTN  5. Tobacco Abuse 6. DM2 - per primary  Plan   ECHO 10/30/14 LVEF 60-65% with severe LAH, Peak PA pressure 38 mm Hg, normal RV  BP remains poorly controlled. Increase Amlodopine to 10 mg. On losartan 172m am/585mpm  Would continue to hold BB with wheezing. Consider switch to bisoprolol on resume. COPD per primary team.  Out 3.7 L and down 4 lbs on 80 mg IV lasix BID.  Transition to po lasix, near baseline weight.  Stressed the importance of tobacco cessation. Has failed with wellbutrin in the past. Stopped for several months after CABG but started again 2/2 stress. Smoked 1/2 ppd up until this admission.  Actos can cause swelling and has a black box warning against use in HF patients.  Will discuss alternatives with pharmacy.  Platelet Count 161 -> 94. Pt has refused lovenox and has not been getting it, so unclear etiology. Will place SCDs and discuss with MD.  Length of Stay: 3  MiShirley FriarA-C 11/01/2014, 8:27 AM  Advanced Heart Failure Team Pager [phone]-F; 7a - 4p)  Please contact [facility] for night-coverage after hours (4p -7a ) and weekends on [email or web address]  Patient seen and examined with AnOda KiltsPA-C. We discussed all aspects of the encounter. I agree with the assessment and plan as stated above.   Volume status much improved. COPD flare also improving. Can switch back to po lasix at previous home dose (20 bid). Can give extra  as needed. [place] from our standpoint. Would not restart b-blocker.   Bensimhon, Daniel,MD 10:29 AM

## 2014-11-01 NOTE — Progress Notes (Signed)
Notified by nurse tech that patient's blood sugar is 429. Patient is asymptomatic. Notified on-call hospitalist via text-page and ordered STAT glucose lab per standing sliding scale order.Orders given to administer 20 units of regular insulin and to also give the meal coverage ordered of 6 units of regular insulin. Carried out orders and will continue to monitor patient to end of shift.

## 2014-11-01 NOTE — Progress Notes (Signed)
[facility] PROGRESS NOTE  Joshua [name] WJX:[id number] DOB: [birth year] DOA: 10/29/2014  PCP: Tammi Sou, MD  Brief HPI: 74 year old Caucasian male with a past medical history of COPD, coronary artery disease status post CABG in 2015, history of pericardial effusion presented with 4 day history of productive cough, shortness of breath with wheezing. He was hospitalized for further management.  Subjective: Seen with wife at bedside. Feels much better, sats okay without oxygen supplementation.  Assessment/Plan: Principal Problem:   Acute on chronic respiratory failure (HCC) Active Problems:   HTN (hypertension), benign   Hyperlipidemia   COPD (chronic obstructive pulmonary disease) (HCC)   Pericardial effusion with cardiac tamponade- s/p perciocentesis 06/30/13   PAF (paroxysmal atrial fibrillation) (HCC)   Coronary artery disease- 3 V at cath 08/08/13   Diabetes mellitus with complication (HCC)   History of asbestosis   Respiratory failure with hypoxia (HCC)   Acute on chronic respiratory failure with hypoxia (HCC)   COPD exacerbation (HCC)   Acute respiratory failure with hypoxia secondary to COPD exacerbation Presented with oxygen saturation of 87% on room air. Continue management as outlined for COPD exacerbation. Continue oxygen to maintain sats around 90%.  Acute COPD exacerbation Patient continues to smoke cigarettes. 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  Started on IV antibiotics and IV steroids. Scheduled bronchodilators, oxygen, antitussives and mucolytics.  History of pericardial effusion Last echocardiogram was from a year ago. Heart size does appear to be more enlarged on chest x-ray compared to before. Repeat another echocardiogram.  History of coronary artery disease status post CABG in 2015 Stable. Continue to monitor  History of chronic diastolic CHF Continue Lasix. Strict ins and outs. Daily weights.  History of type 2  diabetes Anticipate worsening in glycemic control due to steroids. Continue Lantus and sliding scale coverage. Increase to resistant scale. Hemoglobin A1c is 7.7, correlate with the plasma glucose of 174. Her dose increased, patient has high CBGs especially postprandial because of high steroids dose.  History of dyslipidemia Continue statin  History of essential hypertension Stable. Continue medications  Tobacco abuse Counseled to quit smoking.   DVT Prophylaxis: Lovenox    Code Status: Full Code  Family Communication: Discussed with the patient and his family  Disposition Plan: Await improvement in symptoms. Mobilize.  Past medical history:  Past Medical History  Diagnosis Date  . Hypertension   . Tobacco dependence in remission     100+ pack-yr hx: quit after CABG  . Obesity   . Hyperplastic colon polyp 2001  . Erectile dysfunction     Normal testosterone  . Adenomatous colon polyp 10/16/2011    Repeat 2018  . 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 pylori neg, no dysplasia or malignancy.  . Macular degeneration, dry     Mild, bilat (Optometrist, Mayford Knife at [employer] [facility] [place] in [place], [place])  . Iron deficiency anemia 2014    03/2012 capsule endoscopy showed 2 AVMs--likely responsible for his IDA--lifetime iron supp recommended + q75moCBCs.  . 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 COPD (chronic obstructive pulmonary disease) ([facility]     Spirometry  2004 borderline obstruction  . Hyperlipidemia   . 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 Other and unspecified angina pectoris   . DOE (dyspnea on exertion)   . Pneumonia   . DM type 2 (diabetes mellitus, type 2) (  [place])     Poor control on max oral meds--pt eventually agreed to insulin therap  . CAD, multiple vessel     3V CAD cath 08/08/13----CABG done shortly after.  . Chronic renal insufficiency, stage  II (mild)   . Diabetic nephropathy (HCC)     Elevated urine microalb/cr 03/2011  . Hearing loss of both ears 2016    Hearing aids    Consultants: Heart failure team is following  Procedures:  Echocardiogram is pending  Antibiotics: Levofloxacin   Objective: Vital Signs  Filed Vitals:   11/01/14 0530 11/01/14 0749 11/01/14 0946 11/01/14 1222  BP: 145/57  152/60 130/52  Pulse: 78   73  Temp: 97.6 F (36.4 C)   98 F (36.7 C)  TempSrc: Oral   Oral  Resp: 20   20  Height:      Weight: 109.335 kg (241 lb 0.6 oz)     SpO2: 98% 95%  94%    Intake/Output Summary (Last 24 hours) at 11/01/14 1315 Last data filed at 11/01/14 1224  Gross per 24 hour  Intake    700 ml  Output   3975 ml  Net  -3275 ml   Filed Weights   10/30/14 0356 10/31/14 0320 11/01/14 0530  Weight: 110.496 kg (243 lb 9.6 oz) 111.267 kg (245 lb 4.8 oz) 109.335 kg (241 lb 0.6 oz)    General appearance: alert, cooperative, appears stated age and no distress Resp: Diffuse wheezing bilaterally. No crackles. No rhonchi. Cardio: regular rate and rhythm, S1, S2 normal, no murmur, click, rub or gallop GI: soft, non-tender; bowel sounds normal; no masses,  no organomegaly Extremities: extremities normal, atraumatic, no cyanosis or edema Neurologic: No focal deficits.  Lab Results:  Basic Metabolic Panel:  Recent Labs Lab 10/29/14 1215 10/29/14 1858 10/30/14 0506 10/31/14 0256 11/01/14 0305 11/01/14 0325 11/01/14 0659  NA 139  --  136 133* 132* 134*  --   K 4.2  --  4.6 4.7 4.2 4.3  --   CL 108  --  99* 96* 93* 96*  --   CO2 26  --  _0 --   GLUCOSE 178*  --  378* 329* 315* 312* 386*  BUN 18  --  21* 30* 30* 31*  --   CREATININE 0.96 1.14 0.93 0.97 0.99 0.99  --   CALCIUM 8.5*  --  9.3 9.0 8.7* 8.8*  --   PHOS  --   --   --   --   --  4.1  --    Liver Function Tests:  Recent Labs Lab 10/29/14 1215 11/01/14 0325  AST 20  --   ALT 14*  --   ALKPHOS 63  --   BILITOT 0.5  --   PROT  6.3*  --   ALBUMIN 3.4* 3.1*   CBC:  Recent Labs Lab 10/29/14 1215 10/29/14 1858 10/30/14 0506 10/31/14 0256 11/01/14 0305  WBC 7.5 7.8 6.5 8.5 7.8  HGB 11.4* 11.3* 11.7* 11.3* 12.5*  HCT 37.4* 37.4* 38.4* 38.0* 39.7  MCV 85.8 85.4 85.3 86.2 84.8  PLT 169 151 158 161 94*   Cardiac Enzymes:  Recent Labs Lab 10/29/14 1858  TROPONINI <0.03   BNP (last 3 results)  Recent Labs  10/29/14 1215  BNP 402.0*   CBG:  Recent Labs Lab 10/31/14 1617 10/31/14 2213 11/01/14 0538 11/01/14 0827 11/01/14 1107  GLUCAP 246* 255* 429* 357* 210*    No results found for this or  any previous visit (from the past 240 hour(s)).    Studies/Results: No results found.  Medications:  Scheduled: . amLODipine  10 mg Oral Daily  . antiseptic oral rinse  7 mL Mouth Rinse BID  . aspirin EC  81 mg Oral Daily  . budesonide-formoterol  2 puff Inhalation BID  . buPROPion  150 mg Oral BID  . cholecalciferol  1,000 Units Oral Daily  . citalopram  40 mg Oral Daily  . furosemide  20 mg Oral BID  . guaiFENesin  1,200 mg Oral BID  . insulin aspart  0-20 Units Subcutaneous TID WC  . insulin aspart  10 Units Subcutaneous TID WC  . insulin detemir  20 Units Subcutaneous BID  . ipratropium  0.5 mg Nebulization Q6H  . isosorbide mononitrate  30 mg Oral Daily  . levalbuterol  0.63 mg Nebulization Q6H  . levofloxacin  750 mg Oral Daily  . losartan  50 mg Oral Daily   And  . losartan  100 mg Oral Daily  . multivitamin with minerals  1 tablet Oral Daily  . pravastatin  20 mg Oral q1800  . predniSONE  60 mg Oral Q breakfast  . sodium chloride  3 mL Intravenous Q12H  . tamsulosin  0.4 mg Oral QPC supper   Continuous:  [id number] **OR** acetaminophen, alum & mag hydroxide-simeth, guaiFENesin-dextromethorphan, hydrALAZINE, levalbuterol, ondansetron **OR** ondansetron (ZOFRAN) IV, polyethylene glycol     LOS: 3 days   [facility] A  [facility] Pager [phone]  11/01/2014,  1:15 PM  If 7PM-7AM, please contact night-coverage at [email or web address], password [facility]

## 2014-11-01 NOTE — Progress Notes (Signed)
Inpatient Diabetes Program Recommendations  AACE/ADA: New Consensus Statement on Inpatient Glycemic Control (2015)  Target Ranges:  Prepandial:   less than 140 mg/dL      Peak postprandial:   less than 180 mg/dL (1-2 hours)      Critically ill patients:  140 - 180 mg/dL   Review of Glycemic Control  Results for Joshua [name], Joshua [name] (MRN [id number]) as of 11/01/2014 08:50  Ref. Range 10/31/2014 11:08 10/31/2014 16:17 10/31/2014 22:13 11/01/2014 05:38 11/01/2014 08:27  Glucose-Capillary Latest Ref Range: 65-99 mg/dL 253 (H) 246 (H) 255 (H) 429 (H) 357 (H)    Diabetes history: Diabetes Mellitus Outpatient Diabetes medications: Levemir 20-35 units q HS based on blood sugar readings, Humulin R-8 units tid with meals, Metformin 1000 mg bid, Actos 45 mg daily Current orders for Inpatient glycemic control: Levemir 20 units bid, Novolog resistant tid with meals and HS, Novolog 10 units tid with meals  Inpatient Diabetes Program Recommendations:   Note increased in insulin today. May consider increasing Novolog resistant correction to q 4 hours while patient is on IV Solumedrol if blood sugars remain elevated despite these changes.   Gentry Fitz, RN, BA, MHA, CDE Diabetes Coordinator Inpatient Diabetes Program  [phone] (Team Pager) [phone] ([place]) 11/01/2014 8:58 AM

## 2014-11-02 LAB — CBC
HEMATOCRIT: 38.2 % — AB (ref 39.0–52.0)
Hemoglobin: 11.7 g/dL — ABNORMAL LOW (ref 13.0–17.0)
MCH: 25.9 pg — AB (ref 26.0–34.0)
MCHC: 30.6 g/dL (ref 30.0–36.0)
MCV: 84.5 fL (ref 78.0–100.0)
PLATELETS: 162 10*3/uL (ref 150–400)
RBC: 4.52 MIL/uL (ref 4.22–5.81)
RDW: 15.4 % (ref 11.5–15.5)
WBC: 7.2 10*3/uL (ref 4.0–10.5)

## 2014-11-02 LAB — GLUCOSE, CAPILLARY
GLUCOSE-CAPILLARY: 208 mg/dL — AB (ref 65–99)
Glucose-Capillary: 198 mg/dL — ABNORMAL HIGH (ref 65–99)
Glucose-Capillary: 238 mg/dL — ABNORMAL HIGH (ref 65–99)

## 2014-11-02 LAB — RENAL FUNCTION PANEL
Albumin: 2.9 g/dL — ABNORMAL LOW (ref 3.5–5.0)
Anion gap: 9 (ref 5–15)
BUN: 31 mg/dL — AB (ref 6–20)
CHLORIDE: 95 mmol/L — AB (ref 101–111)
CO2: 30 mmol/L (ref 22–32)
CREATININE: 1.09 mg/dL (ref 0.61–1.24)
Calcium: 8.4 mg/dL — ABNORMAL LOW (ref 8.9–10.3)
GFR calc Af Amer: >60 mL/min (ref >60)
GLUCOSE: 228 mg/dL — AB (ref 65–99)
POTASSIUM: 3.8 mmol/L (ref 3.5–5.1)
Phosphorus: 3.3 mg/dL (ref 2.5–4.6)
Sodium: 134 mmol/L — ABNORMAL LOW (ref 135–145)

## 2014-11-02 MED ORDER — PREDNISONE 10 MG PO TABS: ORAL_TABLET | ORAL | Status: DC

## 2014-11-02 MED ORDER — LEVOFLOXACIN 750 MG PO TABS: 750.0000 mg | ORAL_TABLET | Freq: Every day | ORAL | Status: DC

## 2014-11-02 MED ORDER — INSULIN ASPART 100 UNIT/ML ~~LOC~~ SOLN
10.0000 [IU] | Freq: Three times a day (TID) | SUBCUTANEOUS | Status: DC
  Administered 2014-11-02: 10 [IU] via SUBCUTANEOUS

## 2014-11-02 MED ORDER — PREDNISONE 10 MG PO TABS
60.0000 mg | ORAL_TABLET | Freq: Every day | ORAL | Status: DC
  Administered 2014-11-02: 60 mg via ORAL
  Filled 2014-11-02 (×2): qty 1

## 2014-11-02 MED ORDER — LOSARTAN POTASSIUM 100 MG PO TABS: 100.0000 mg | ORAL_TABLET | Freq: Every day | ORAL | Status: DC

## 2014-11-02 MED ORDER — AMLODIPINE BESYLATE 10 MG PO TABS: 10.0000 mg | ORAL_TABLET | Freq: Every day | ORAL | Status: DC

## 2014-11-02 MED ORDER — GUAIFENESIN ER 600 MG PO TB12
1200.0000 mg | ORAL_TABLET | Freq: Two times a day (BID) | ORAL | Status: DC
Start: 2014-11-02 — End: 2017-12-27

## 2014-11-02 NOTE — Progress Notes (Signed)
Discharge teaching done with patient and wife at 80. Call received from daughter with further teaching done via phone. Verbalized understanding. Iv d'cd and patient ready for discharge. Refused evening meds and stated patient will take at home.

## 2014-11-02 NOTE — Care Management Note (Signed)
Case Management Note  Patient Details  Name: PERCELL LAMBOY MRN: [id number] Date of Birth: 1940/10/12  Subjective/Objective:  74 y.o M to be discharged home today. Anticipate no Discharge needs.                   Action/Plan:Will be available for disposition/discharge needs.    Expected Discharge Date:  11/02/14               Expected Discharge Plan:  Home/Self Care  In-House Referral:     Discharge planning Services  CM Consult  Post Acute Care Choice:    Choice offered to:     DME Arranged:    DME Agency:     HH Arranged:    HH Agency:     Status of Service:  Completed, signed off  Medicare Important Message Given:  Yes-second notification given Date Medicare IM Given:    Medicare IM give by:    Date Additional Medicare IM Given:    Additional Medicare Important Message give by:     If discussed at [place] of Stay Meetings, dates discussed:    Additional Comments:  Delrae Sawyers, RN 11/02/2014, 2:27 PM

## 2014-11-02 NOTE — Discharge Summary (Signed)
Physician Discharge Summary  [name]:[id number] DOB: 1940/11/12 DOA: 10/29/2014  PCP: Tammi Sou, MD  Admit date: 10/29/2014 Discharge date: 11/02/2014  Time spent: 40 minutes  Recommendations for Outpatient Follow-up:  1. Follow-up with primary care physician within 1 week. 2. Patient started on losartan and amlodipine on discharge, Altace and metoprolol discontinued.   Discharge Diagnoses:  Principal Problem:   Acute on chronic respiratory failure (HCC) Active Problems:   HTN (hypertension), benign   Hyperlipidemia   COPD (chronic obstructive pulmonary disease) (HCC)   Pericardial effusion with cardiac tamponade- s/p perciocentesis 06/30/13   PAF (paroxysmal atrial fibrillation) (HCC)   Coronary artery disease- 3 V at cath 08/08/13   Diabetes mellitus with complication [facility])   History of asbestosis   Respiratory failure with hypoxia ([place])   Acute on chronic respiratory failure with hypoxia (HCC)   COPD exacerbation ([place])   Discharge Condition: Stable  Diet recommendation: Heart healthy  Filed Weights   10/31/14 0320 11/01/14 0530 11/02/14 0419  Weight: 111.267 kg (245 lb 4.8 oz) 109.335 kg (241 lb 0.6 oz) 108.836 kg (239 lb 15 oz)    History of present illness:  Joshua [name] is a 74 y.o. male, with history of COPD, ongoing smoking counseled to quit, CAD status post CABG 1 year ago, pericardial effusion with pericardial tamponade requiring up pericardial window placement 1-1/2 years ago, essential hypertension, paroxysmal atrial fibrillation [place] Vasc score of over 3, iron deficiency anemia, macular degeneration, DM type II who lives at his home with his wife presented to [facility] with 4 day history of productive cough, gradually aggressive exertional shortness of breath with wheezing, no orthopnea, no peripheral edema, does have gradual weight gain over the last several months and appears to be obesity other than fluid overload, denies any exposure  to sick contacts, did not take flu shot yet, no recent travel, no fever or chills.  He continued to have gradually progressive shortness of breath, productive cough and wheezing and presented to [facility] today, there he was diagnosed with acute hypoxic respiratory failure, he was given prednisone, doxycycline, IV Lasix, had a presumptive diagnosis of COPD exacerbation with possible CHF and was transferred to [facility] for further care.   Hospital Course:   Acute respiratory failure with hypoxia secondary to COPD exacerbation Presented with oxygen saturation of 87% on room air. Continue management as outlined for COPD exacerbation. This is resolved patient saturation above 90% on room air..  Acute COPD exacerbation Patient continues to smoke cigarettes. 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  Started on IV antibiotics and IV steroids. Scheduled bronchodilators, oxygen, antitussives and mucolytics. Discharge levofloxacin, steroid taper and Mucinex.   History of pericardial effusion Last echocardiogram was from a year ago. Heart size does appear to be more enlarged on chest x-ray compared to before. Repeat another echocardiogram.  History of coronary artery disease status post CABG in 2015 Stable. Continue to monitor  History of chronic diastolic CHF Has mild acute on chronic diastolic CHF. CHF team consulted. Diuresis increased with IV Lasix for a couple of days, patient down 8 pounds since admission. Back on baseline indications. Per CHF team discontinue beta blockers because of reactive airway. Started on amlodipine and losartan on discharge. Altace and metoprolol discontinued  History of type 2 diabetes Anticipate worsening in glycemic control due to steroids. Continue Lantus and sliding scale coverage. Increase to resistant scale. Hemoglobin A1c is 7.7, correlate with the plasma  glucose of 174. Her dose increased, patient has high CBGs especially  postprandial because of high steroids dose.  History of dyslipidemia Continue statin  History of essential hypertension Stable. Continue medications  Tobacco abuse Counseled to quit smoking.   Procedures:  None  Consultations:  None  Discharge Exam: Filed Vitals:   11/02/14 0903  BP:   Pulse: 85  Temp:   Resp: 20   General: Alert and awake, oriented x3, not in any acute distress. HEENT: anicteric sclera, pupils reactive to light and accommodation, EOMI CVS: S1-S2 clear, no murmur rubs or gallops Chest: clear to auscultation bilaterally, no wheezing, rales or rhonchi Abdomen: soft nontender, nondistended, normal bowel sounds, no organomegaly Extremities: no cyanosis, clubbing or edema noted bilaterally Neuro: Cranial nerves II-XII intact, no focal neurological deficits  Discharge Instructions   Discharge Instructions    Diet - low sodium heart healthy    Complete by:  As directed      Increase activity slowly    Complete by:  As directed           Current Discharge Medication List    START taking these medications   Details  amLODipine (NORVASC) 10 MG tablet Take 1 tablet (10 mg total) by mouth daily. Qty: 30 tablet, Refills: 0    guaiFENesin (MUCINEX) 600 MG 12 hr tablet Take 2 tablets (1,200 mg total) by mouth 2 (two) times daily. Qty: 30 tablet, Refills: 0    levofloxacin (LEVAQUIN) 750 MG tablet Take 1 tablet (750 mg total) by mouth daily. Qty: 3 tablet, Refills: 0    losartan (COZAAR) 100 MG tablet Take 1 tablet (100 mg total) by mouth daily. Qty: 30 tablet, Refills: 0    predniSONE (DELTASONE) 10 MG tablet Take 4 tabs orally by mouth for 2 days, then take 3 tabs orally by mouth for 2 days, then take 2 tabs orally by mouth for 2 days, then take 1 tabs orally by mouth for 2 days, then stop. Qty: 21 tablet, Refills: 0      CONTINUE these medications which have NOT CHANGED   Details  acetaminophen (TYLENOL) 325 MG tablet Take 2 tablets (650 mg  total) by mouth every 4 (four) hours as needed for headache or mild pain.    aspirin 81 MG tablet Take 81 mg by mouth at bedtime.     budesonide-formoterol (SYMBICORT) 160-4.5 MCG/ACT inhaler Inhale 2 puffs into the lungs 2 (two) times daily. Qty: 1 Inhaler, Refills: 12    buPROPion (WELLBUTRIN SR) 150 MG 12 hr tablet 1 tab po bid Qty: 180 tablet, Refills: 1    Cholecalciferol (VITAMIN D-3) 1000 UNITS CAPS Take 1,000 Units by mouth daily.     Chromium Picolinate 1000 MCG TABS Take 1,000 mcg by mouth daily.     citalopram (CELEXA) 40 MG tablet Take 1 tablet (40 mg total) by mouth daily. Qty: 90 tablet, Refills: 3    furosemide (LASIX) 40 MG tablet 1/2-1 tab po qd prn as directed by MD Qty: 90 tablet, Refills: 1    Insulin Detemir (LEVEMIR FLEXPEN) 100 UNIT/ML Pen Inject 45 Units into the skin at bedtime. 25 U SQ qhs Qty: 50 mL, Refills: 1    insulin regular (NOVOLIN R,HUMULIN R) 100 units/mL injection 8 U SQ q AC (take about 20 min prior to eating) Qty: 10 mL, Refills: 11    isosorbide mononitrate (IMDUR) 30 MG 24 hr tablet Take 1 tablet (30 mg total) by mouth daily. Qty: 90 tablet, Refills: 1  lovastatin (MEVACOR) 40 MG tablet Take 1 tablet (40 mg total) by mouth 2 (two) times daily. Qty: 180 tablet, Refills: 1    metFORMIN (GLUCOPHAGE) 1000 MG tablet Take 1 tablet (1,000 mg total) by mouth 2 (two) times daily with a meal. Qty: 180 tablet, Refills: 1    Multiple Vitamin (MULTIVITAMIN WITH MINERALS) TABS Take 1 tablet by mouth daily.    tamsulosin (FLOMAX) 0.4 MG CAPS capsule TAKE 1 CAPSULE BY MOUTH EVERY DAY AFTER SUPPER Qty: 90 capsule, Refills: 1      STOP taking these medications     metoprolol tartrate (LOPRESSOR) 25 MG tablet      pioglitazone (ACTOS) 45 MG tablet      ramipril (ALTACE) 10 MG capsule        Allergies  Allergen Reactions  . Morphine And Related Other (See Comments)    Drenched with perspiration  . Demerol [Meperidine] Nausea Only  .  Starlix [Nateglinide] Other (See Comments)    gassy   Follow-up Information    Follow up with Ermalinda Barrios, PA-C On 11/12/2014.   Specialty:  Cardiology   Why:  at 1245 for post hospital follow up. Please bring all of your medications to your visit.    Contact information:   [address] [place] [address] [phone]        The results of significant diagnostics from this hospitalization (including imaging, microbiology, ancillary and laboratory) are listed below for reference.    Significant Diagnostic Studies: Dg Chest 2 View  10/29/2014  CLINICAL DATA:  Shortness of breath. History of cardiac and pulmonary issues. EXAM: CHEST  2 VIEW COMPARISON:  09/19/2013 FINDINGS: Previous median sternotomy and CABG procedure. Small bilateral pleural effusions and mild interstitial edema noted. Aortic atherosclerosis identified. No airspace consolidation. IMPRESSION: 1. Mild CHF. Electronically Signed   By: Kerby Moors M.D.   On: 10/29/2014 14:07    Microbiology: No results found for this or any previous visit (from the past 240 hour(s)).   Labs: Basic Metabolic Panel:  Recent Labs Lab 10/30/14 0506 10/31/14 0256 11/01/14 0305 11/01/14 0325 11/01/14 0659 11/02/14 0252  NA 136 133* 132* 134*  --  134*  K 4.6 4.7 4.2 4.3  --  3.8  CL 99* 96* 93* 96*  --  95*  CO2 _0 --  30  GLUCOSE 378* 329* 315* 312* 386* 228*  BUN 21* 30* 30* 31*  --  31*  CREATININE 0.93 0.97 0.99 0.99  --  1.09  CALCIUM 9.3 9.0 8.7* 8.8*  --  8.4*  PHOS  --   --   --  4.1  --  3.3   Liver Function Tests:  Recent Labs Lab 10/29/14 1215 11/01/14 0325 11/02/14 0252  AST 20  --   --   ALT 14*  --   --   ALKPHOS 63  --   --   BILITOT 0.5  --   --   PROT 6.3*  --   --   ALBUMIN 3.4* 3.1* 2.9*   No results for input(s): LIPASE, AMYLASE in the last 168 hours. No results for input(s): AMMONIA in the last 168 hours. CBC:  Recent Labs Lab 10/29/14 1858 10/30/14 0506  10/31/14 0256 11/01/14 0305 11/02/14 0252  WBC 7.8 6.5 8.5 7.8 7.2  HGB 11.3* 11.7* 11.3* 12.5* 11.7*  HCT 37.4* 38.4* 38.0* 39.7 38.2*  MCV 85.4 85.3 86.2 84.8 84.5  PLT 151 158 161 94* 162  Cardiac Enzymes:  Recent Labs Lab 10/29/14 1858  TROPONINI <0.03   BNP: BNP (last 3 results)  Recent Labs  10/29/14 1215  BNP 402.0*    ProBNP (last 3 results) No results for input(s): PROBNP in the last 8760 hours.  CBG:  Recent Labs Lab 11/01/14 1107 11/01/14 1646 11/01/14 2127 11/02/14 0532 11/02/14 1111  GLUCAP 210* 184* 219* 198* 208*       Signed:  Meron Bocchino A  [facility] 11/02/2014, 11:25 AM

## 2014-11-07 ENCOUNTER — Encounter: Payer: Self-pay | Admitting: Family Medicine

## 2014-11-07 ENCOUNTER — Ambulatory Visit (INDEPENDENT_AMBULATORY_CARE_PROVIDER_SITE_OTHER): Payer: Medicare Other | Admitting: Family Medicine

## 2014-11-07 VITALS — BP 130/82 | HR 64 | Temp 98.2°F | Resp 16 | Ht 71.0 in | Wt 240.0 lb

## 2014-11-07 DIAGNOSIS — J441 Chronic obstructive pulmonary disease with (acute) exacerbation: Secondary | ICD-10-CM | POA: Diagnosis not present

## 2014-11-07 DIAGNOSIS — J9601 Acute respiratory failure with hypoxia: Secondary | ICD-10-CM | POA: Diagnosis not present

## 2014-11-07 DIAGNOSIS — E118 Type 2 diabetes mellitus with unspecified complications: Secondary | ICD-10-CM | POA: Diagnosis not present

## 2014-11-07 DIAGNOSIS — I10 Essential (primary) hypertension: Secondary | ICD-10-CM | POA: Diagnosis not present

## 2014-11-07 LAB — BASIC METABOLIC PANEL
BUN: 40 mg/dL — ABNORMAL HIGH (ref 7–25)
CHLORIDE: 103 mmol/L (ref 98–110)
CO2: 23 mmol/L (ref 20–31)
Calcium: 8.6 mg/dL (ref 8.6–10.3)
Creat: 1.12 mg/dL (ref 0.70–1.18)
Glucose, Bld: 132 mg/dL — ABNORMAL HIGH (ref 65–99)
POTASSIUM: 4.5 mmol/L (ref 3.5–5.3)
Sodium: 139 mmol/L (ref 135–146)

## 2014-11-07 NOTE — Progress Notes (Signed)
OFFICE VISIT  11/07/2014   CC:  Chief Complaint  Patient presents with  . Hospitalization Follow-up    Acute on chronic respiratory failure    HPI:    Patient is a 73 y.o. Caucasian male who presents for hospital f/u: Admitted 10/24-10/28, 2016 for acute resp failure with hypoxia believed to be due to COPD exacerbation and acute on chronic diastolic CHF.  Reviewed [facility] records today as well as d/c summary. He was d/c'd home OFF of his beta blocker due to possible increased RAD with this med, changed to amlodipine and losartan. Also d/c'd home on prednisone taper and levaquin and diuretics.  Breathing feeling good, some coughing occ w/ mucous production.  Eating and drinking fine, trying to eat low Na diet. Wt is down 5 lb from day of d/c from hospital.   He finished his prednisone taper and his antibiotic. He has quit smoking completely again.  No nicotine replacement.  Past Medical History  Diagnosis Date  . Hypertension   . Tobacco dependence in remission     100+ pack-yr hx: quit after CABG  . Obesity   . Hyperplastic colon polyp 2001  . Erectile dysfunction     Normal testosterone  . Adenomatous colon polyp 10/16/2011    Repeat 2018  . 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 pylori neg, no dysplasia or malignancy.  . Macular degeneration, dry     Mild, bilat (Optometrist, Mayford Knife at [employer] [facility] [place] in [place], [place])  . Iron deficiency anemia 2014    03/2012 capsule endoscopy showed 2 AVMs--likely responsible for his IDA--lifetime iron supp recommended + q70moCBCs.  . 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 COPD (chronic obstructive pulmonary disease) ([place]     Spirometry  2004 borderline obstruction  . Hyperlipidemia   . 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 Other and unspecified angina pectoris   . DOE (dyspnea on exertion)   . Pneumonia   .  DM type 2 (diabetes mellitus, type 2) (HCC)     Poor control on max oral meds--pt eventually agreed to insulin therap  . CAD, multiple vessel     3V CAD cath 08/08/13----CABG done shortly after.  . Chronic renal insufficiency, stage II (mild)   . Diabetic nephropathy (HCC)     Elevated urine microalb/cr 03/2011  . Hearing loss of both ears 2016    Hearing aids    Past Surgical History  Procedure Laterality Date  . Cholecystectomy open  1999  . Appendectomy  1957  . Hip surgery Right 1954    Repair of slipped capital femoral epiphysis.  . Patella fracture surgery Left ~ 1979    bolt + 3 screws to repair tib plateau fx  . Testicle surgery  as a child    Undescended testicle brought down into scrotum  . Tonsillectomy  1947  . Colonoscopy  10/16/2011    Procedure: COLONOSCOPY;  Surgeon: RInda Castle MD;  Location: [facility];  Service: Endoscopy;  Laterality: N/A;  . Cataract extraction w/ intraocular lens  implant, bilateral  04/08/2006 & 04/22/2006  . Esophagogastroduodenoscopy  02/25/12    Atrophic gastritis with a few erosions--capsule endoscopy planned as of 02/25/12 (Dr. KDeatra Ina.  . Total hip arthroplasty Right 08/12/2012    Procedure: REMOVAL OF OLD PINS RIGHT HIP AND RIGHT TOTAL HIP ARTHROPLASTY ANTERIOR APPROACH;  Surgeon: CMcarthur Rossetti  MD;  Location: [facility];  Service: Orthopedics;  Laterality: Right;  . Hardware removal Right 08/12/2012    Procedure: HARDWARE REMOVAL;  Surgeon: Mcarthur Rossetti, MD;  Location: [facility];  Service: Orthopedics;  Laterality: Right;  . Cardiac catheterization  08/08/2013  . Cataract extraction w/ intraocular lens  implant, bilateral Bilateral   . Coronary artery bypass graft N/A 08/14/2013    Procedure: CORONARY ARTERY BYPASS GRAFTING (CABG);  Surgeon: Melrose Nakayama, MD;  Location: [facility];  Service: Open Heart Surgery;  Laterality: N/A;  Times 4   using left internal mammary artery and endoscopically harvested bilateral saphenous vein   . Intraoperative transesophageal echocardiogram N/A 08/14/2013    Normal LV function. Procedure: INTRAOPERATIVE TRANSESOPHAGEAL ECHOCARDIOGRAM;  Surgeon: Melrose Nakayama, MD;  Location: [facility];  Service: Open Heart Surgery;  Laterality: N/A;  . Pericardial tap N/A 07/01/2013    Procedure: PERICARDIAL TAP;  Surgeon: Jettie Booze, MD;  Location: [facility] CATH LAB;  Service: Cardiovascular;  Laterality: N/A;  . Left heart catheterization with coronary angiogram N/A 08/08/2013    Procedure: LEFT HEART CATHETERIZATION WITH CORONARY ANGIOGRAM;  Surgeon: Jettie Booze, MD;  Location: [facility] CATH LAB;  Service: Cardiovascular;  Laterality: N/A;  . Transthoracic echocardiogram  10/30/14    Mildl LVH, EF 60-65%, normal wall motion, mod mitral regurg, mild PAH    Outpatient Prescriptions Prior to Visit  Medication Sig Dispense Refill  . acetaminophen (TYLENOL) 325 MG tablet Take 2 tablets (650 mg total) by mouth every 4 (four) hours as needed for headache or mild pain.    [name] amLODipine (NORVASC) 10 MG tablet Take 1 tablet (10 mg total) by mouth daily. 30 tablet 0  . aspirin 81 MG tablet Take 81 mg by mouth at bedtime.     . budesonide-formoterol (SYMBICORT) 160-4.5 MCG/ACT inhaler Inhale 2 puffs into the lungs 2 (two) times daily. 1 Inhaler 12  . buPROPion (WELLBUTRIN SR) 150 MG 12 hr tablet 1 tab po bid (Patient taking differently: Take 150 mg by mouth 2 (two) times daily. 1 tab po bid) 180 tablet 1  . Cholecalciferol (VITAMIN D-3) 1000 UNITS CAPS Take 1,000 Units by mouth daily.     . Chromium Picolinate 1000 MCG TABS Take 1,000 mcg by mouth daily.     . citalopram (CELEXA) 40 MG tablet Take 1 tablet (40 mg total) by mouth daily. 90 tablet 3  . furosemide (LASIX) 40 MG tablet 1/2-1 tab po qd prn as directed by MD (Patient taking differently: Take 20 mg by mouth 2 (two) times daily. ) 90 tablet 1  . guaiFENesin (MUCINEX) 600 MG 12 hr tablet Take 2 tablets (1,200 mg total) by mouth 2 (two) times daily.  30 tablet 0  . Insulin Detemir (LEVEMIR FLEXPEN) 100 UNIT/ML Pen Inject 45 Units into the skin at bedtime. 25 U SQ qhs (Patient taking differently: Inject 20-35 Units into the skin at bedtime. Sliding Scale: 130-140 = 20 units, 140-150 = 21 units, 150-160 = 22 units, 250 and above = 35 units) 50 mL 1  . insulin regular (NOVOLIN R,HUMULIN R) 100 units/mL injection 8 U SQ q AC (take about 20 min prior to eating) (Patient taking differently: Inject 8 Units into the skin 3 (three) times daily before meals. 8 U SQ q AC (take about 20 min prior to eating)) 10 mL 11  . isosorbide mononitrate (IMDUR) 30 MG 24 hr tablet Take 1 tablet (30 mg total) by mouth daily. 90 tablet 1  .  losartan (COZAAR) 100 MG tablet Take 1 tablet (100 mg total) by mouth daily. 30 tablet 0  . lovastatin (MEVACOR) 40 MG tablet Take 1 tablet (40 mg total) by mouth 2 (two) times daily. 180 tablet 1  . metFORMIN (GLUCOPHAGE) 1000 MG tablet Take 1 tablet (1,000 mg total) by mouth 2 (two) times daily with a meal. 180 tablet 1  . Multiple Vitamin (MULTIVITAMIN WITH MINERALS) TABS Take 1 tablet by mouth daily.    . tamsulosin (FLOMAX) 0.4 MG CAPS capsule TAKE 1 CAPSULE BY MOUTH EVERY DAY AFTER SUPPER 90 capsule 1  . levofloxacin (LEVAQUIN) 750 MG tablet Take 1 tablet (750 mg total) by mouth daily. (Patient not taking: Reported on 11/07/2014) 3 tablet 0  . predniSONE (DELTASONE) 10 MG tablet Take 4 tabs orally by mouth for 2 days, then take 3 tabs orally by mouth for 2 days, then take 2 tabs orally by mouth for 2 days, then take 1 tabs orally by mouth for 2 days, then stop. (Patient not taking: Reported on 11/07/2014) 21 tablet 0   No facility-administered medications prior to visit.    Allergies  Allergen Reactions  . Morphine And Related Other (See Comments)    Drenched with perspiration  . Demerol [Meperidine] Nausea Only  . Starlix [Nateglinide] Other (See Comments)    gassy    ROS As per HPI  PE: Blood pressure 130/82, pulse  64, temperature 98.2 F (36.8 C), temperature source Oral, resp. rate 16, height _0  (1.803 m), weight 240 lb (108.863 kg), SpO2 97 %. Gen: Alert, well appearing.  Patient is oriented to person, place, time, and situation. CV: RRR, no m/r/g.   LUNGS: CTA bilat, nonlabored resps, good aeration in all lung fields. EXT: no clubbing or cyanosis.  1+ pitting edema up to just below the knees bilat  LABS:    Chemistry      Component Value Date/Time   NA 134* 11/02/2014 0252   K 3.8 11/02/2014 0252   CL 95* 11/02/2014 0252   CO2 30 11/02/2014 0252   BUN 31* 11/02/2014 0252   CREATININE 1.09 11/02/2014 0252      Component Value Date/Time   CALCIUM 8.4* 11/02/2014 0252   ALKPHOS 63 10/29/2014 1215   AST 20 10/29/2014 1215   ALT 14* 10/29/2014 1215   BILITOT 0.5 10/29/2014 1215     Lab Results  Component Value Date   WBC 7.2 11/02/2014   HGB 11.7* 11/02/2014   HCT 38.2* 11/02/2014   MCV 84.5 11/02/2014   PLT 162 11/02/2014   Lab Results  Component Value Date   HGBA1C 7.7* 10/29/2014     IMPRESSION AND PLAN:  1) Chronic diastolic CHF, recent acute exacerbation resolved. Continue current meds, including lasix 62m bid.  Check lytes/cr today. Emphasized need to eat low Na diet and monitor wt daily. Has cardiology f/u appt 11/12/14.  2) COPD: recent exacerbation resolved s/p steroid taper and abx. He has stopped smoking.  Continue symbicort. Has pulm f/u appt 11/20/14.  3) HTN: The current medical regimen is effective;  continue present plan and medications. No more beta blocker or ACE-I.  He was changed to amlodipine and ARB in hosp and we'll continue these.  4) DM 2, not discussed much today but glucoses have been higher on recent systemic steroids. Continue insulins, monitoring, expect gradual improvement the longer he is off his steroids. Recheck this in 10-14 d in more detail.  An After Visit Summary was printed and given to the  patient.  FOLLOW UP: Return for f/u  10-14 d recheck resp failure/DM (30 min).

## 2014-11-07 NOTE — Progress Notes (Signed)
Pre visit review using our clinic review tool, if applicable. No additional management support is needed unless otherwise documented below in the visit note.

## 2014-11-12 ENCOUNTER — Telehealth: Payer: Self-pay

## 2014-11-12 ENCOUNTER — Ambulatory Visit (INDEPENDENT_AMBULATORY_CARE_PROVIDER_SITE_OTHER): Payer: Medicare Other | Admitting: Physician Assistant

## 2014-11-12 ENCOUNTER — Encounter: Payer: Self-pay | Admitting: Physician Assistant

## 2014-11-12 VITALS — BP 120/50 | HR 68 | Ht 71.0 in | Wt 252.0 lb

## 2014-11-12 DIAGNOSIS — F17200 Nicotine dependence, unspecified, uncomplicated: Secondary | ICD-10-CM

## 2014-11-12 DIAGNOSIS — I480 Paroxysmal atrial fibrillation: Secondary | ICD-10-CM

## 2014-11-12 DIAGNOSIS — I10 Essential (primary) hypertension: Secondary | ICD-10-CM

## 2014-11-12 DIAGNOSIS — I5043 Acute on chronic combined systolic (congestive) and diastolic (congestive) heart failure: Secondary | ICD-10-CM | POA: Insufficient documentation

## 2014-11-12 DIAGNOSIS — I2510 Atherosclerotic heart disease of native coronary artery without angina pectoris: Secondary | ICD-10-CM

## 2014-11-12 DIAGNOSIS — I5033 Acute on chronic diastolic (congestive) heart failure: Secondary | ICD-10-CM | POA: Diagnosis not present

## 2014-11-12 DIAGNOSIS — N181 Chronic kidney disease, stage 1: Secondary | ICD-10-CM | POA: Diagnosis not present

## 2014-11-12 MED ORDER — RIVAROXABAN 20 MG PO TABS: 20.0000 mg | ORAL_TABLET | Freq: Every day | ORAL | Status: DC

## 2014-11-12 MED ORDER — POTASSIUM CHLORIDE CRYS ER 20 MEQ PO TBCR: 20.0000 meq | EXTENDED_RELEASE_TABLET | Freq: Every day | ORAL | Status: DC

## 2014-11-12 MED ORDER — FUROSEMIDE 40 MG PO TABS: 40.0000 mg | ORAL_TABLET | Freq: Every day | ORAL | Status: DC

## 2014-11-12 NOTE — Assessment & Plan Note (Signed)
Patient has weight gain and increase in edema since hospitalization. He is only taking Lasix 20 mg once daily. Increase Lasix to 40 mg once daily. Add potassium 20 mEq daily. Call for weight gain of 2-3 pounds overnight or worsening edema. Hopefully he will diurese and not have to be hospitalized. Recent 2-D echo showed normal LV function EF 60-65%. Follow up renal function next week.

## 2014-11-12 NOTE — Assessment & Plan Note (Signed)
Patient quit smoking since hospitalization.

## 2014-11-12 NOTE — Progress Notes (Signed)
Cardiology Office Note   Date:  11/12/2014   ID:  Joshua [name], Joshua [name] 19-Feb-1940, MRN [id number]  PCP:  Tammi Sou, MD  Cardiologist:  Dr. Irish Lack   Chief Complaint: Edema    History of Present Illness: Joshua [name] is a 74 y.o. male who presents for post hospital follow-up. Patient has history of pericardial effusion with pericardial tamponade requiring window 1-1/2 years ago. He then underwent CABG 06/2013. He also has paroxysmal atrial fibrillation,CHADSVASC=5 (age,CHF,CAD,DM), diabetes mellitus, and hypertension, CKD stage II and tobacco abuse.   He presented to the hospital with acute COPD exacerbation and acute on chronic diastolic heart failure. He diuresed 8 pounds and was sent home on amlodipine and losartan. His altace and metoprolol were discontinued. Patient is unaware of his paroxysmal atrial fibrillation.  Patient comes in today with worsening edema. He has gained proximally 6 pounds on his scales since hospitalization. He tries to watch his salt but does get some. He has chronic dyspnea on exertion that has unchanged. He denies palpitations or chest pain. He weighed 239 pounds at discharge from the hospital and his 252 pounds on our scales. He did quit smoking.   Past Medical History  Diagnosis Date  . Hypertension   . Tobacco dependence in remission     100+ pack-yr hx: quit after CABG  . Obesity   . Hyperplastic colon polyp 2001  . Erectile dysfunction     Normal testosterone  . Adenomatous colon polyp 10/16/2011    Repeat 2018  . 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 pylori neg, no dysplasia or malignancy.  . Macular degeneration, dry     Mild, bilat (Optometrist, Mayford Knife at [facility] [facility] [place] in [place], [place])  . Iron deficiency anemia 2014    03/2012 capsule endoscopy showed 2 AVMs--likely responsible for his IDA--lifetime iron supp recommended + q53moCBCs.  . 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 COPD  (chronic obstructive pulmonary disease) ([place]     Spirometry  2004 borderline obstruction  . Hyperlipidemia   . 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 Other and unspecified angina pectoris   . DOE (dyspnea on exertion)   . Pneumonia   . DM type 2 (diabetes mellitus, type 2) (HCC)     Poor control on max oral meds--pt eventually agreed to insulin therap  . CAD, multiple vessel     3V CAD cath 08/08/13----CABG done shortly after.  . Chronic renal insufficiency, stage II (mild)   . Diabetic nephropathy (HCC)     Elevated urine microalb/cr 03/2011  . Hearing loss of both ears 2016    Hearing aids    Past Surgical History  Procedure Laterality Date  . Cholecystectomy open  1999  . Appendectomy  1957  . Hip surgery Right 1954    Repair of slipped capital femoral epiphysis.  . Patella fracture surgery Left ~ 1979    bolt + 3 screws to repair tib plateau fx  . Testicle surgery  as a child    Undescended testicle brought down into scrotum  . Tonsillectomy  1947  . Colonoscopy  10/16/2011    Procedure: COLONOSCOPY;  Surgeon: RInda Castle MD;  Location: [facility];  Service: Endoscopy;  Laterality: N/A;  . Cataract extraction w/ intraocular lens  implant, bilateral  04/08/2006 & 04/22/2006  . Esophagogastroduodenoscopy  02/25/12    Atrophic gastritis  with a few erosions--capsule endoscopy planned as of 02/25/12 (Dr. Deatra Ina).  . Total hip arthroplasty Right 08/12/2012    Procedure: REMOVAL OF OLD PINS RIGHT HIP AND RIGHT TOTAL HIP ARTHROPLASTY ANTERIOR APPROACH;  Surgeon: Mcarthur Rossetti, MD;  Location: [facility];  Service: Orthopedics;  Laterality: Right;  . Hardware removal Right 08/12/2012    Procedure: HARDWARE REMOVAL;  Surgeon: Mcarthur Rossetti, MD;  Location: [facility];  Service: Orthopedics;  Laterality: Right;  . Cardiac catheterization  08/08/2013  . Cataract extraction w/ intraocular lens  implant,  bilateral Bilateral   . Coronary artery bypass graft N/A 08/14/2013    Procedure: CORONARY ARTERY BYPASS GRAFTING (CABG);  Surgeon: Melrose Nakayama, MD;  Location: [place];  Service: Open Heart Surgery;  Laterality: N/A;  Times 4   using left internal mammary artery and endoscopically harvested bilateral saphenous vein  . Intraoperative transesophageal echocardiogram N/A 08/14/2013    Normal LV function. Procedure: INTRAOPERATIVE TRANSESOPHAGEAL ECHOCARDIOGRAM;  Surgeon: Melrose Nakayama, MD;  Location: [place];  Service: Open Heart Surgery;  Laterality: N/A;  . Pericardial tap N/A 07/01/2013    Procedure: PERICARDIAL TAP;  Surgeon: Jettie Booze, MD;  Location: [facility] CATH LAB;  Service: Cardiovascular;  Laterality: N/A;  . Left heart catheterization with coronary angiogram N/A 08/08/2013    Procedure: LEFT HEART CATHETERIZATION WITH CORONARY ANGIOGRAM;  Surgeon: Jettie Booze, MD;  Location: [facility] CATH LAB;  Service: Cardiovascular;  Laterality: N/A;  . Transthoracic echocardiogram  10/30/14    Mildl LVH, EF 60-65%, normal wall motion, mod mitral regurg, mild PAH     Current Outpatient Prescriptions  Medication Sig Dispense Refill  . acetaminophen (TYLENOL) 325 MG tablet Take 2 tablets (650 mg total) by mouth every 4 (four) hours as needed for headache or mild pain.    [name] amLODipine (NORVASC) 10 MG tablet Take 1 tablet (10 mg total) by mouth daily. 30 tablet 0  . aspirin 81 MG tablet Take 81 mg by mouth at bedtime.     . budesonide-formoterol (SYMBICORT) 160-4.5 MCG/ACT inhaler Inhale 2 puffs into the lungs 2 (two) times daily. 1 Inhaler 12  . buPROPion (ZYBAN) 150 MG 12 hr tablet Take 150 mg by mouth 2 (two) times daily.    . Cholecalciferol (VITAMIN D-3) 1000 UNITS CAPS Take 1,000 Units by mouth daily.     . Chromium Picolinate 1000 MCG TABS Take 1,000 mcg by mouth daily.     . citalopram (CELEXA) 40 MG tablet Take 1 tablet (40 mg total) by mouth daily. 90 tablet 3  . furosemide  (LASIX) 40 MG tablet Take 20 mg by mouth as needed.     [name] guaiFENesin (MUCINEX) 600 MG 12 hr tablet Take 2 tablets (1,200 mg total) by mouth 2 (two) times daily. 30 tablet 0  . Insulin Detemir (LEVEMIR FLEXPEN) 100 UNIT/ML Pen Inject 45 Units into the skin at bedtime. 25 U SQ qhs (Patient taking differently: Inject 20-35 Units into the skin at bedtime. Sliding Scale: 130-140 = 20 units, 140-150 = 21 units, 150-160 = 22 units, 250 and above = 35 units) 50 mL 1  . insulin regular (NOVOLIN R,HUMULIN R) 100 units/mL injection 8 U SQ q AC (take about 20 min prior to eating) (Patient taking differently: Inject 8 Units into the skin 3 (three) times daily before meals. 8 U SQ q AC (take about 20 min prior to eating)) 10 mL 11  . isosorbide mononitrate (IMDUR) 30 MG 24 hr tablet Take 1 tablet (  30 mg total) by mouth daily. 90 tablet 1  . levofloxacin (LEVAQUIN) 750 MG tablet     . losartan (COZAAR) 100 MG tablet Take 1 tablet (100 mg total) by mouth daily. 30 tablet 0  . lovastatin (MEVACOR) 40 MG tablet Take 1 tablet (40 mg total) by mouth 2 (two) times daily. 180 tablet 1  . metFORMIN (GLUCOPHAGE) 1000 MG tablet Take 1 tablet (1,000 mg total) by mouth 2 (two) times daily with a meal. 180 tablet 1  . Multiple Vitamin (MULTIVITAMIN WITH MINERALS) TABS Take 1 tablet by mouth daily.    . pioglitazone (ACTOS) 45 MG tablet Take 45 mg by mouth daily.     . predniSONE (DELTASONE) 10 MG tablet Take 10 mg by mouth daily with breakfast.     . ramipril (ALTACE) 10 MG capsule Take 10 mg by mouth 2 (two) times daily.     . tamsulosin (FLOMAX) 0.4 MG CAPS capsule TAKE 1 CAPSULE BY MOUTH EVERY DAY AFTER SUPPER 90 capsule 1   No current facility-administered medications for this visit.    Allergies:   Morphine and related; Demerol; and Starlix    Social History:  The patient  reports that he quit smoking about 16 months ago. His smoking use included Cigarettes. He has a 62 pack-year smoking history. He quit smokeless  tobacco use about 16 months ago. He reports that he does not drink alcohol or use illicit drugs.   Family History:  The patient's    family history includes Breast cancer in his sister; CVA in his mother; Diabetes in his maternal uncle and paternal grandmother; Heart attack in his father; Heart disease in his father and maternal uncle; Hypertension in his mother. There is no history of Stroke.    ROS:  Please see the history of present illness.   Otherwise, review of systems are positive for chronic leg swelling, hearing loss, dyspnea on exertion, snoring, chronic back pain, balance issues, easy bruising.   All other systems are reviewed and negative.    PHYSICAL EXAM: VS:  BP 120/50 mmHg  Pulse 68  Ht _0  (1.803 m)  Wt 252 lb (114.306 kg)  BMI 35.16 kg/m2 , BMI Body mass index is 35.16 kg/(m^2). GEN:  obese, in no acute distress Neck: no JVD, HJR, carotid bruits, or masses Cardiac: RRR; distant heart sounds, no murmurs,gallop, rubs, thrill or heave,  Respiratory: Decreased breath sounds with scattered wheezing throughout GI: soft, nontender, nondistended, + BS MS: no deformity or atrophy Extremities: His 2 edema bilaterally without cyanosis, clubbing,  good distal pulses bilaterally.  Skin: warm and dry, no rash Neuro:  Strength and sensation are intact    EKG:  EKG is ordered today. The ekg ordered today demonstrates normal sinus rhythm with incomplete right bundle branch block. EKG is reviewed from the hospital and on 11/02/14 he was in atrial fibrillation   Recent Labs: 10/29/2014: ALT 14*; B Natriuretic Peptide 402.0* 11/02/2014: Hemoglobin 11.7*; Platelets 162 11/07/2014: BUN 40*; Creat 1.12; Potassium 4.5; Sodium 139    Lipid Panel    Component Value Date/Time   CHOL 161 04/17/2014 0826   TRIG 218.0* 04/17/2014 0826   HDL 47.60 04/17/2014 0826   CHOLHDL 3 04/17/2014 0826   VLDL 43.6* 04/17/2014 0826   LDLCALC 63 07/04/2013 0325   LDLDIRECT 77.0 04/17/2014 0826       Wt Readings from Last 3 Encounters:  11/12/14 252 lb (114.306 kg)  11/07/14 240 lb (108.863 kg)  11/02/14 239 lb 15  oz (108.836 kg)      Other studies Reviewed: Additional studies/ records that were reviewed today include and review of the records demonstrates:  2-D echo 10/30/14 Study Conclusions  - Left ventricle: The cavity size was normal. Wall thickness was   increased in a pattern of moderate LVH. Systolic function was   normal. The estimated ejection fraction was in the range of 60%   to 65%. Wall motion was normal; there were no regional wall   motion abnormalities. - Mitral valve: Moderately calcified annulus. There was moderate   regurgitation. - Right atrium: The atrium was mildly dilated. - Pulmonary arteries: Systolic pressure was mildly increased. PA   peak pressure: 38 mm Hg (S).  ASSESSMENT AND PLAN:  PAF (paroxysmal atrial fibrillation) ([place]) Patient has paroxysmal atrial fibrillation. He is not on anticoagulation. CHADSVASC=5. Had a long discussion with patient concerning NOAC's and he is agreeable to start. His most recent renal functions have been stable. We'll begin Xarelto 20 mg once daily. Follow-up renal function next week. Follow-up with Dr.Varanasi in 3-4 weeks.  Acute on chronic diastolic heart failure ([place]) Patient has weight gain and increase in edema since hospitalization. He is only taking Lasix 20 mg once daily. Increase Lasix to 40 mg once daily. Add potassium 20 mEq daily. Call for weight gain of 2-3 pounds overnight or worsening edema. Hopefully he will diurese and not have to be hospitalized. Recent 2-D echo showed normal LV function EF 60-65%. Follow up renal function next week.  Coronary artery disease- 3 V at cath 08/08/13 Stable without chest pain  HTN (hypertension), benign Blood Pressure controlled  Tobacco dependence Patient quit smoking since hospitalization.    Signed, Ermalinda Barrios, PA-C  11/12/2014 1:38 PM    [facility] [place], [place], [place]  [address] Phone: [phone]; Fax: [phone]

## 2014-11-12 NOTE — Patient Instructions (Signed)
Medication Instructions:   START TAKING XARELTO 20 MG ONCE A DAY   START TAKING LASIX 40 MG ONCE A DAY   START TAKING POTASSIUM 20 MEQ EVERY TIME YOU TAKE A LASIX ONCE A DAY     If you need a refill on your cardiac medications before your next appointment, please call your pharmacy.  Labwork:  BMET NEXT WEEK    Testing/Procedures:  NONE ORDER TODAY]\    Follow-Up:  IN 1 TO 2 MONTHS WITH DR Irish Lack   Any Other Special Instructions Will Be Listed Below (If Applicable).

## 2014-11-12 NOTE — Telephone Encounter (Signed)
Prior auth for Xarelto 20mg sent to [facility]. 

## 2014-11-12 NOTE — Assessment & Plan Note (Signed)
Blood Pressure controlled

## 2014-11-12 NOTE — Assessment & Plan Note (Signed)
Patient has paroxysmal atrial fibrillation. He is not on anticoagulation. CHADSVASC=5. Had a long discussion with patient concerning NOAC's and he is agreeable to start. His most recent renal functions have been stable. We'll begin Xarelto 20 mg once daily. Follow-up renal function next week. Follow-up with Dr.Varanasi in 3-4 weeks.

## 2014-11-12 NOTE — Assessment & Plan Note (Signed)
Stable without chest pain 

## 2014-11-13 ENCOUNTER — Telehealth: Payer: Self-pay

## 2014-11-13 NOTE — Patient Outreach (Signed)
[place] [facility]) Care Management  11/13/2014  Joshua [name] [birth year] [id number]   Patient triggered RED on EMMI Heart Failure Dashboard, assigned [name], RN to outreach for [name] services.  Thanks, [name]. [place], [place] Assistant Phone: [phone] Fax: [phone]

## 2014-11-13 NOTE — Telephone Encounter (Signed)
Opened in error.

## 2014-11-13 NOTE — Telephone Encounter (Signed)
Xarelto approved through 11/12/2015. PA # [id number].

## 2014-11-14 ENCOUNTER — Other Ambulatory Visit: Payer: Self-pay | Admitting: *Deleted

## 2014-11-14 DIAGNOSIS — I5033 Acute on chronic diastolic (congestive) heart failure: Secondary | ICD-10-CM

## 2014-11-14 DIAGNOSIS — F32A Depression, unspecified: Secondary | ICD-10-CM

## 2014-11-14 DIAGNOSIS — F329 Major depressive disorder, single episode, unspecified: Secondary | ICD-10-CM

## 2014-11-14 NOTE — Patient Outreach (Signed)
[place] [facility]) Care Management  11/14/2014  Joshua [name] 1940-07-29 [id number]   Patient triggered RED on EMMI Heart Failure dashboard, notification sent to [name], RN.  Thanks, [name]. [place], [place] Assistant Phone: [phone] Fax: [phone]

## 2014-11-14 NOTE — Patient Outreach (Signed)
[place] [facility]) Care Management  11/14/2014  OSEPH IMBURGIA [birth year] [id number]   EMMI_Heart Failure referral:  Recent hospital admission 10/24-10/28/2016  Telephone call to patient; spouse answered phone and stated that she handles all of his calls and health concerns. States she is his caregiver/spouse. Advised spouse of reason for call regarding heart failure Emmi- program. EMMI-Heart failure dashboard concerns addressed regarding new swelling.  Subjective: Spouse voices that patient was having new swelling of feet. States she has been weighing patient daily with no weight gain over 1 pound. Voices that patient had follow up appointment with primary care provider 11/07/14 and another appointment  scheduled for 11/19/14. States has follow up appointment with cardiology -Physician Assistant Bonnell Public who increased fluid pill dosage. Having no problem with transportation to appointments or obtaining medications. Understands importance of taking medications as prescribed by physicians. Many health conditions -Heart disease (open heart surgery 2015), heart failure, COPD, Diabetes (on insulin)Kidney disease, HTN, Heart failure, Depression. States has shortness of breath with activity. States having depression because unable to work. Has been on depression medication for about year. Recently primary care increased dosage of depression medication. Currently not seeing a counselor . Admits to receiving paper discharge instruction which are in file cabinet but did not read and go over with patient when he got home. Recommendation for referral to [facility] community care coordinator for complex case management  and clinical social worker for psychosocial concerns. Spouse consents to referral.  Objective: See assessments completed. See medication review as noted.    Assessment: Multiple complex diagnoses. Recent hospital stay with noted acute on chronic diastolic heart failure, acute COPD exacerbation,  paroxsymal atrial fibrillation, chronic dyspnea on exertion Taking over 10 medications. Patient concerned about inability to work. Currently on medication for depression prescribed per primary care doctor.   Plan: EMMI-Heart failure call completed and signed off. Initial transition of care call done. Referral to care management assistant to refer to  community care coordinator and clinical social worker.   [name], RN BSN [place] Management Coordinator [facility] Care Management  [phone]

## 2014-11-15 NOTE — Patient Outreach (Signed)
[place] [facility]) Care Management  11/15/2014  ABRAN GAVIGAN 11/19/1940 [id number]   Request from [name], RN to assign Community RN and SW, assigned Deloria Lair, NP and [employer], LCSW.  Thanks, [name]. [place], [place] Assistant Phone: [phone] Fax: [phone]

## 2014-11-15 NOTE — Patient Outreach (Signed)
[place] [facility]) Care Management  11/15/2014  Joshua [name] Dec 31, 1940 [id number]   Triggered RED on EMMI Heart Failure dashboard, notification sent to [name], RN.  Thanks, [name]. [name], [name] Assistant Phone: [phone] Fax: [phone]

## 2014-11-16 ENCOUNTER — Other Ambulatory Visit: Payer: Self-pay | Admitting: *Deleted

## 2014-11-16 NOTE — Patient Outreach (Signed)
Received referral for Community Case Management. Pt was recently hospitalized for HF. He is receiving the [facility] phone calls and has had several days of RED ALERTS.  I called and spoke to Mr. Parisien today. He reports his wt has gone up 6 pounds from when he came home from the hospital. He had seen his NP on 11/07/14 and his furosemide was increased from 20 mg to 40 mg.  He denies SOB but does report some edema that has increased over the last week.  We reviewed his lengthy med list and there are some concerns that I am alerting Dr. Anitra Lauth to today:  PLAN:  I will see pt at his home on Monday after his MD appt. To establish him in the HF program.             I have instructed the pt to take 60 mg of furosemide today, Saturday and Sunday.             I have given him my phone number that he can call me over the weekend if necessary.  MED CONCERNS:  Pt/wife states he is taking BOTH LOSARTAN AND RAMIPRIL. I am thinking he should only be on                                    One of these.  Also I am uncomfortable with the way he is taking his insulins. He reports he takes Levimir 35 units in the am and 20 units at lunch and takes his regular insulin with his evening meal and at HS. He says this is the way he was instructed in the hospital. Luckily he has not had any hypoglycemia.   Thank you!  Deloria Lair [place] [facility] [place] [phone]

## 2014-11-19 ENCOUNTER — Ambulatory Visit (INDEPENDENT_AMBULATORY_CARE_PROVIDER_SITE_OTHER): Payer: Medicare Other | Admitting: Family Medicine

## 2014-11-19 ENCOUNTER — Encounter: Payer: Self-pay | Admitting: Family Medicine

## 2014-11-19 ENCOUNTER — Other Ambulatory Visit: Payer: Self-pay | Admitting: *Deleted

## 2014-11-19 VITALS — BP 119/64 | HR 59 | Temp 98.4°F | Resp 16 | Ht 71.0 in | Wt 244.0 lb

## 2014-11-19 DIAGNOSIS — J438 Other emphysema: Secondary | ICD-10-CM | POA: Diagnosis not present

## 2014-11-19 DIAGNOSIS — I5032 Chronic diastolic (congestive) heart failure: Secondary | ICD-10-CM | POA: Diagnosis not present

## 2014-11-19 DIAGNOSIS — I480 Paroxysmal atrial fibrillation: Secondary | ICD-10-CM | POA: Diagnosis not present

## 2014-11-19 DIAGNOSIS — E118 Type 2 diabetes mellitus with unspecified complications: Secondary | ICD-10-CM

## 2014-11-19 LAB — BASIC METABOLIC PANEL
BUN: 34 mg/dL — ABNORMAL HIGH (ref 6–23)
CHLORIDE: 99 meq/L (ref 96–112)
CO2: 29 meq/L (ref 19–32)
CREATININE: 1.2 mg/dL (ref 0.40–1.50)
Calcium: 9.1 mg/dL (ref 8.4–10.5)
GFR: 62.63 mL/min (ref >60.00)
GLUCOSE: 201 mg/dL — AB (ref 70–99)
Potassium: 4.6 mEq/L (ref 3.5–5.1)
Sodium: 137 mEq/L (ref 135–145)

## 2014-11-19 MED ORDER — INSULIN DETEMIR 100 UNIT/ML FLEXPEN: PEN_INJECTOR | SUBCUTANEOUS | Status: DC

## 2014-11-19 NOTE — Progress Notes (Addendum)
OFFICE VISIT  11/19/2014   CC:  Chief Complaint  Patient presents with  . Follow-up    HFU. Pt is not fasting.      HPI:    Patient is a 74 y.o. Caucasian male who presents for 2 week f/u chronic diastolic HF + COPD. Cardiology saw him 11/12/14 and increased lasix to 15m qd b/c his wt was up.  He has lost a few lbs, says breathing feels fine and feels like LE swelling has gone down some.  Wife upset today b/c he was started on xarelto for PAF but she feels like no one ever told them about this dx and she is worried as well about their inability to afford this med.  Asks about alternatives, discussed coumadin with lovenox bridge briefly today but he'll remain on xarelto for now until his 1 mo free supply runs out.  Also f/u DM 2: checking glucose fasting AM and occ very late hs.   Not sure why he is splitting his levemir dosing and not sure why he is giving R insulin at bedtime, but not at BF. Glucoses vary from rare normal reading to a mostly high 100s, into the 200s at times.  He did just get through with a steroid taper s/p hospitalization.   Past Medical History  Diagnosis Date  . Hypertension   . Tobacco dependence in remission     100+ pack-yr hx: quit after CABG  . Obesity   . Hyperplastic colon polyp 2001  . Erectile dysfunction     Normal testosterone  . Adenomatous colon polyp 10/16/2011    Repeat 2018  . 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 pylori neg, no dysplasia or malignancy.  . Macular degeneration, dry     Mild, bilat (Optometrist, [place]  . Iron deficiency anemia 2014    03/2012 capsule endoscopy showed 2 AVMs--likely responsible for his IDA--lifetime iron supp recommended + q651moBCs.  . 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 COPD (chronic obstructive pulmonary disease) ([place]    Spirometry  2004 borderline obstruction  . Hyperlipidemia   . Pericardial effusion with  cardiac tamponade 6//29/15    Infectious/inflamm (cytology showed NO MALIGNANT CELLS)  . Other and unspecified angina pectoris   . DOE (dyspnea on exertion)     COPD + chronic diastolic HF  . Pneumonia   . DM type 2 (diabetes mellitus, type 2) (HCC)     Poor control on max oral meds--pt eventually agreed to insulin therap  . CAD, multiple vessel     3V CAD cath 08/08/13----CABG done shortly after.  . Chronic renal insufficiency, stage II (mild)   . Diabetic nephropathy (HCC)     Elevated urine microalb/cr 03/2011  . Hearing loss of both ears 2016    Hearing aids  . Chronic diastolic heart failure ([place]    Past Surgical History  Procedure Laterality Date  . Cholecystectomy open  1999  . Appendectomy  1957  . Hip surgery Right 1954    Repair of slipped capital femoral epiphysis.  . Patella fracture surgery Left ~ 1979    bolt + 3 screws to repair tib plateau fx  . Testicle surgery  as a child    Undescended testicle brought down into scrotum  . Tonsillectomy  1947  . Colonoscopy  10/16/2011    Procedure: COLONOSCOPY;  Surgeon: RoInda CastleMD;  Location: [facility];  Service: Endoscopy;  Laterality: N/A;  . Cataract extraction w/ intraocular lens  implant, bilateral  04/08/2006 & 04/22/2006  . Esophagogastroduodenoscopy  02/25/12    Atrophic gastritis with a few erosions--capsule endoscopy planned as of 02/25/12 (Dr. Deatra Ina).  . Total hip arthroplasty Right 08/12/2012    Procedure: REMOVAL OF OLD PINS RIGHT HIP AND RIGHT TOTAL HIP ARTHROPLASTY ANTERIOR APPROACH;  Surgeon: Mcarthur Rossetti, MD;  Location: [facility];  Service: Orthopedics;  Laterality: Right;  . Hardware removal Right 08/12/2012    Procedure: HARDWARE REMOVAL;  Surgeon: Mcarthur Rossetti, MD;  Location: [facility];  Service: Orthopedics;  Laterality: Right;  . Cardiac catheterization  08/08/2013  . Cataract extraction w/ intraocular lens  implant, bilateral Bilateral   . Coronary artery bypass graft N/A 08/14/2013     Procedure: CORONARY ARTERY BYPASS GRAFTING (CABG);  Surgeon: Melrose Nakayama, MD;  Location: [place];  Service: Open Heart Surgery;  Laterality: N/A;  Times 4   using left internal mammary artery and endoscopically harvested bilateral saphenous vein  . Intraoperative transesophageal echocardiogram N/A 08/14/2013    Normal LV function. Procedure: INTRAOPERATIVE TRANSESOPHAGEAL ECHOCARDIOGRAM;  Surgeon: Melrose Nakayama, MD;  Location: [place];  Service: Open Heart Surgery;  Laterality: N/A;  . Pericardial tap N/A 07/01/2013    Procedure: PERICARDIAL TAP;  Surgeon: Jettie Booze, MD;  Location: [facility] CATH LAB;  Service: Cardiovascular;  Laterality: N/A;  . Left heart catheterization with coronary angiogram N/A 08/08/2013    Procedure: LEFT HEART CATHETERIZATION WITH CORONARY ANGIOGRAM;  Surgeon: Jettie Booze, MD;  Location: [facility] CATH LAB;  Service: Cardiovascular;  Laterality: N/A;  . Transthoracic echocardiogram  10/30/14    Mod LVH, EF 60-65%, normal wall motion, mod mitral regurg, mild PAH    Outpatient Prescriptions Prior to Visit  Medication Sig Dispense Refill  . acetaminophen (TYLENOL) 325 MG tablet Take 2 tablets (650 mg total) by mouth every 4 (four) hours as needed for headache or mild pain.    [name] amLODipine (NORVASC) 10 MG tablet Take 1 tablet (10 mg total) by mouth daily. 30 tablet 0  . aspirin 81 MG tablet Take 81 mg by mouth at bedtime.     . budesonide-formoterol (SYMBICORT) 160-4.5 MCG/ACT inhaler Inhale 2 puffs into the lungs 2 (two) times daily. 1 Inhaler 12  . buPROPion (ZYBAN) 150 MG 12 hr tablet Take 150 mg by mouth 2 (two) times daily.    . Cholecalciferol (VITAMIN D-3) 1000 UNITS CAPS Take 1,000 Units by mouth daily.     . Chromium Picolinate 1000 MCG TABS Take 1,000 mcg by mouth daily.     . citalopram (CELEXA) 40 MG tablet Take 1 tablet (40 mg total) by mouth daily. 90 tablet 3  . furosemide (LASIX) 40 MG tablet Take 1 tablet (40 mg total) by mouth daily. 90  tablet 4  . guaiFENesin (MUCINEX) 600 MG 12 hr tablet Take 2 tablets (1,200 mg total) by mouth 2 (two) times daily. 30 tablet 0  . insulin regular (NOVOLIN R,HUMULIN R) 100 units/mL injection 8 U SQ q AC (take about 20 min prior to eating) (Patient taking differently: Inject 8 Units into the skin 3 (three) times daily before meals. 8 U SQ q AC (take about 20 min prior to eating)) 10 mL 11  . isosorbide mononitrate (IMDUR) 30 MG 24 hr tablet Take 1 tablet (30 mg total) by mouth daily. 90 tablet 1  . lovastatin (MEVACOR) 40 MG tablet Take 1 tablet (  40 mg total) by mouth 2 (two) times daily. 180 tablet 1  . metFORMIN (GLUCOPHAGE) 1000 MG tablet Take 1 tablet (1,000 mg total) by mouth 2 (two) times daily with a meal. 180 tablet 1  . Multiple Vitamin (MULTIVITAMIN WITH MINERALS) TABS Take 1 tablet by mouth daily.    . potassium chloride SA (KLOR-CON M20) 20 MEQ tablet Take 1 tablet (20 mEq total) by mouth daily. 90 tablet 4  . ramipril (ALTACE) 10 MG capsule Take 10 mg by mouth 2 (two) times daily.     . rivaroxaban (XARELTO) 20 MG TABS tablet Take 1 tablet (20 mg total) by mouth daily with supper. 90 tablet 4  . tamsulosin (FLOMAX) 0.4 MG CAPS capsule TAKE 1 CAPSULE BY MOUTH EVERY DAY AFTER SUPPER 90 capsule 1  . Insulin Detemir (LEVEMIR FLEXPEN) 100 UNIT/ML Pen Inject 45 Units into the skin at bedtime. 25 U SQ qhs (Patient taking differently: Inject 20-35 Units into the skin at bedtime. Sliding Scale: 130-140 = 20 units, 140-150 = 21 units, 150-160 = 22 units, 250 and above = 35 units) 50 mL 1  . losartan (COZAAR) 100 MG tablet Take 1 tablet (100 mg total) by mouth daily. (Patient not taking: Reported on 11/19/2014) 30 tablet 0   No facility-administered medications prior to visit.    Allergies  Allergen Reactions  . Morphine And Related Other (See Comments)    Drenched with perspiration  . Demerol [Meperidine] Nausea Only  . Starlix [Nateglinide] Other (See Comments)    gassy    ROS As per  HPI  PE: Blood pressure 119/64, pulse 59, temperature 98.4 F (36.9 C), temperature source Oral, resp. rate 16, height _0  (1.803 m), weight 244 lb (110.678 kg), SpO2 96 %. Gen: Alert, well appearing.  Patient is oriented to person, place, time, and situation. CV: RRR, distant S1 and S2. Chest is clear, no wheezing or rales. Normal symmetric air entry throughout both lung fields. No chest wall deformities or tenderness. EXT: 1+ pitting edema in RLL, 1+ pitting edema in L LL. No clubbing or cyanosis.  LABS:    Chemistry      Component Value Date/Time   NA 139 11/07/2014 0906   K 4.5 11/07/2014 0906   CL 103 11/07/2014 0906   CO2 23 11/07/2014 0906   BUN 40* 11/07/2014 0906   CREATININE 1.12 11/07/2014 0906   CREATININE 1.09 11/02/2014 0252      Component Value Date/Time   CALCIUM 8.6 11/07/2014 0906   ALKPHOS 63 10/29/2014 1215   AST 20 10/29/2014 1215   ALT 14* 10/29/2014 1215   BILITOT 0.5 10/29/2014 1215     Lab Results  Component Value Date   HGBA1C 7.7* 10/29/2014   Lab Results  Component Value Date   CHOL 161 04/17/2014   HDL 47.60 04/17/2014   LDLCALC 63 07/04/2013   LDLDIRECT 77.0 04/17/2014   TRIG 218.0* 04/17/2014   CHOLHDL 3 04/17/2014   Lab Results  Component Value Date   WBC 7.2 11/02/2014   HGB 11.7* 11/02/2014   HCT 38.2* 11/02/2014   MCV 84.5 11/02/2014   PLT 162 11/02/2014     IMPRESSION AND PLAN:  1) Diastol CHF: wt back down 8 lbs over the last 2 wks on our scales. Continue current med dosing and recheck BMET today.  2) COPD: The current medical regimen is effective;  continue present plan and medications.   3) DM 2: reiterated importance of taking insulin as instructed: take all 55  U of levemir in one nightly dosing. Take 10 U R insulin with BF, lunch, and supper, but NONE at bedtime. Pt and wife expressed understanding.    4) PAF: cont xarelto until runs out of free 30 d supply, then pt/wife are thinking they will likely want to  switch over to coumadin due to cost.  F/u 3 weeks to discuss.  I did discuss the fact that this switch-over to coumadin would require a lovenox bridge and they expressed understanding of this. Next cardiology visit not until 01/2015 per pt report today.  An After Visit Summary was printed and given to the patient.  FOLLOW UP: Return in about 3 weeks (around 12/10/2014) for F/u DM and discuss anticoagulation (30 min o/v).   Addendum 11/19/14: Pt's wife with questions about his PAF dx.  In review of chart, it appears this was not a noted dx until his hospitalization in October this year (he was seen by cardiology 06/2014 in their office this year w/out this dx and I saw him a few times before the October hospitalization and he never had a hx of PAF).  It was noted throughout his hospitalization as a diagnosis but at d/c he was not sent home on anticoagulation.  It appears that his first cardiology post-hospital f/u was the time it was addressed/treated with anticoagulation in mind, and he was started on xarelto at that time.  --PM

## 2014-11-19 NOTE — Progress Notes (Signed)
Pre visit review using our clinic review tool, if applicable. No additional management support is needed unless otherwise documented below in the visit note.

## 2014-11-19 NOTE — Patient Outreach (Signed)
[facility]) Care Management   11/19/2014  SANJEEV MAIN [birth year] [id number]  Joshua [name] is an 74 y.o. male  Subjective: Pt was recently hospitalized for HF. He has a long list of comorbidities (COPD, DM, CKD...) There has been some medication confusion. Pt saw his primary MD, Dr. Anitra Lauth this morning and his antihypertensive meds and insulin regimen was clarified. Pt is also taking METOPROLOL that is NOT ON CURRENT MED LIST.  Pt is committed to controlling his problems. He is recording daily weights and vital signs. He is walking on the treadmill several times a day. He does admit to difficultly totally avoiding salt but he is using a low salt product. He prepares his own med  Box weekly.  His wife reports he sleeps all the time day and night.  He does not have an appt with Dr. Irish Lack until January.  Objective:   Review of Systems  Constitutional: Negative.   HENT: Negative.   Eyes: Negative.   Respiratory: Negative.   Cardiovascular: Negative.   Gastrointestinal: Negative.   Genitourinary: Positive for frequency.  Musculoskeletal: Positive for falls.  Skin: Negative.   Neurological: Negative.   Endo/Heme/Allergies: Negative.   Psychiatric/Behavioral: Negative.     height is 1.816 m (5' 11.5") and weight is 241 lb (109.317 kg). His blood pressure is 78/28 and his pulse is 60. His respiration is 16 and oxygen saturation is 95%. FBS :   Physical Exam  Constitutional: He is oriented to person, place, and time. He appears well-developed and well-nourished.  HENT:  Head: Normocephalic and atraumatic.  Respiratory: Effort normal and breath sounds normal.  GI: Soft. Bowel sounds are normal.  Musculoskeletal:  Limited ROM.  Neurological: He is alert and oriented to person, place, and time.  Skin: Skin is warm and dry.  Psychiatric: He has a normal mood and affect.   Current Medications:   Current Outpatient Prescriptions  Medication Sig Dispense  Refill  . acetaminophen (TYLENOL) 325 MG tablet Take 2 tablets (650 mg total) by mouth every 4 (four) hours as needed for headache or mild pain.    [name] amLODipine (NORVASC) 10 MG tablet Take 1 tablet (10 mg total) by mouth daily. 30 tablet 0  . aspirin 81 MG tablet Take 81 mg by mouth at bedtime.     . budesonide-formoterol (SYMBICORT) 160-4.5 MCG/ACT inhaler Inhale 2 puffs into the lungs 2 (two) times daily. 1 Inhaler 12  . buPROPion (ZYBAN) 150 MG 12 hr tablet Take 150 mg by mouth 2 (two) times daily.    . Cholecalciferol (VITAMIN D-3) 1000 UNITS CAPS Take 1,000 Units by mouth daily.     . Chromium Picolinate 1000 MCG TABS Take 1,000 mcg by mouth daily.     . citalopram (CELEXA) 40 MG tablet Take 1 tablet (40 mg total) by mouth daily. 90 tablet 3  . furosemide (LASIX) 40 MG tablet Take 1 tablet (40 mg total) by mouth daily. 90 tablet 4  . guaiFENesin (MUCINEX) 600 MG 12 hr tablet Take 2 tablets (1,200 mg total) by mouth 2 (two) times daily. 30 tablet 0  . Insulin Detemir (LEVEMIR FLEXPEN) 100 UNIT/ML Pen 55 U SQ qhs 50 mL 1  . insulin regular (NOVOLIN R,HUMULIN R) 100 units/mL injection 8 U SQ q AC (take about 20 min prior to eating) (Patient taking differently: Inject 8 Units into the skin 3 (three) times daily before meals. 8 U SQ q AC (take about 20 min prior to eating)) 10 mL  11  . isosorbide mononitrate (IMDUR) 30 MG 24 hr tablet Take 1 tablet (30 mg total) by mouth daily. 90 tablet 1  . lovastatin (MEVACOR) 40 MG tablet Take 1 tablet (40 mg total) by mouth 2 (two) times daily. 180 tablet 1  . metFORMIN (GLUCOPHAGE) 1000 MG tablet Take 1 tablet (1,000 mg total) by mouth 2 (two) times daily with a meal. 180 tablet 1  . Multiple Vitamin (MULTIVITAMIN WITH MINERALS) TABS Take 1 tablet by mouth daily.    . potassium chloride SA (KLOR-CON M20) 20 MEQ tablet Take 1 tablet (20 mEq total) by mouth daily. 90 tablet 4  . ramipril (ALTACE) 10 MG capsule Take 10 mg by mouth 2 (two) times daily.     .  rivaroxaban (XARELTO) 20 MG TABS tablet Take 1 tablet (20 mg total) by mouth daily with supper. 90 tablet 4  . tamsulosin (FLOMAX) 0.4 MG CAPS capsule TAKE 1 CAPSULE BY MOUTH EVERY DAY AFTER SUPPER 90 capsule 1   No current facility-administered medications for this visit.    Functional Status:   In your present state of health, do you have any difficulty performing the following activities: 10/29/2014  Hearing? Y  Vision? N  Difficulty concentrating or making decisions? N  Walking or climbing stairs? N  Dressing or bathing? N  Doing errands, shopping? N    Fall/Depression Screening:    PHQ 2/9 Scores 11/19/2014 11/14/2014 06/11/2014 10/03/2013  PHQ - 2 Score _0 0  PHQ- 9 Score 7 11 - -   Fall Risk  11/19/2014 06/11/2014  Falls in the past year? Yes Yes  Number falls in past yr: 2 or more 1  Injury with Fall? Yes No  Risk Factor Category  High Fall Risk -  Risk for fall due to : History of fall(s);Impaired balance/gait;Impaired mobility;Medication side effect -  Follow up Falls evaluation completed -   Assessment:  Hypotension     HF   COPD   DM   High risk for falls  Plan: Follow up on Friday.          Get cardiology appt scheduled and discuss optional HF medication regimen with Dr.                  Anitra Lauth and Dr. Irish Lack. Considering pts low BP today even though I believe my                  cuff is not calibrated correctly, I have removed the metoprolol from his med box and                  reduced his furosemide to 40 mg only per day. (I would prefer to have him on the                  metoprolol instead of amlodipine and continue the ramipril - What do you think???)  [facility] CM Care Plan Problem One        Most Recent Value   Care Plan Problem One  HF   Role Documenting the Problem One  Care Management Dent for Problem One  Active   THN CM Short Term Goal #1 (0-30 days)  Pt will not readmit in the next 30 days.   [facility] CM Short Term Goal #1 Start Date   11/19/14   Interventions for Short Term Goal #1  discussed HF self maintenance.   THN CM Short Term Goal #2 (  0-30 days)  Pt will weigh every day and record and call NP if wt goes up 3-5 pounds   [facility] CM Short Term Goal #2 Start Date  11/19/14   Interventions for Short Term Goal #2  Reinforced daily HF routine.   THN CM Short Term Goal #3 (0-30 days)  Pt will continue to to monitor his diet and minize sodium intake.   THN CM Short Term Goal #3 Start Date  11/19/14   Interventions for Short Tern Goal #3  Discussed low salt diet.    THN CM Care Plan Problem Two        Most Recent Value   Care Plan Problem Two  High risk for falls   Role Documenting the Problem Two  Care Management Coordinator   Care Plan for Problem Two  Active   Interventions for Problem Two Long Term Goal   Gave pt falls safety brochure and discussed arising slowly and standing still for a minute or 3 before walking.   [facility] Long Term Goal (31-90) days  Pt will not fall in the next 90 days.   [facility] Long Term Goal Start Date  11/19/14     [facility] [place] [phone]

## 2014-11-20 ENCOUNTER — Encounter: Payer: Self-pay | Admitting: Pulmonary Disease

## 2014-11-20 ENCOUNTER — Encounter: Payer: Self-pay | Admitting: *Deleted

## 2014-11-20 ENCOUNTER — Ambulatory Visit (INDEPENDENT_AMBULATORY_CARE_PROVIDER_SITE_OTHER): Payer: Medicare Other | Admitting: Pulmonary Disease

## 2014-11-20 VITALS — BP 108/60 | HR 78 | Ht 71.5 in | Wt 244.4 lb

## 2014-11-20 DIAGNOSIS — Z77090 Contact with and (suspected) exposure to asbestos: Secondary | ICD-10-CM

## 2014-11-20 DIAGNOSIS — J439 Emphysema, unspecified: Secondary | ICD-10-CM

## 2014-11-20 NOTE — Patient Instructions (Signed)
Continue using the Symbicort as prescribed.  Return to clinic in 6 months.

## 2014-11-20 NOTE — Progress Notes (Signed)
Subjective:    Patient ID: Joshua [name], male    DOB: September 13, 1940, 74 y.o.   MRN: [id number]  HPI Consult for evaluation of asbestosis and COPD.  Joshua [name] is a 74 year old with history of COPD. He was hospitalized in October 2016 with acute on chronic respiratory failure secondary to diastolic heart failure, COPD exacerbation. He was managed with diuresis, Levaquin, steroids. He was discharged on a prednisone taper.  He is a heavy smoker he smoked about 3 packs per day for 40 years. He quit last month after this hospitalization. He worked as a Advertising account planner. He reports exposure to asbestos for about a year to 2 years between 1958 and 1959. He's had spirometry in 2004 which showed borderline obstruction. Repeat pulmonary function tests in 2015 shows moderate obstruction. He's been maintained on Symbicort for many years. He does not take it on a regular basis and skips some days. He reports that he has dyspnea on exertion. No cough, sputum production, fevers, chills.  DATA: CT chest [07/02/13] There are small, layering bilateral pleural effusions. Bilateral calcified pleural plaques with a Deep pendant and basilar predominance. No concerning pleural nodularity. There is dependent atelectasis. Subpleural ground-glass density at the right apex measuring up to 2.4 cm. No evidence of suspicious, solid, nodule.  PFTs [08/09/13] FVC 3.24 [74%] FEV1 2.12 [65%] F/F 65 TLC 79 DLCO 71. Moderate obstruction, no significant bronchodilator response. Mild restrictive lung disease Decreased diffusion capacity that corrects when adjusted for alveolar volume.  Chest x-ray [10/29/14] Mild CHF  Echo [10/30/14] - Left ventricle: The cavity size was normal. Wall thickness was increased in a pattern of moderate LVH. Systolic function was normal. The estimated ejection fraction was in the range of 60% to 65%. Wall motion was normal; there were no regional wall motion  abnormalities. - Mitral valve: Moderately calcified annulus. There was moderate regurgitation. - Right atrium: The atrium was mildly dilated. - Pulmonary arteries: Systolic pressure was mildly increased. PA peak pressure: 38 mm Hg (S).  Past Medical History  Diagnosis Date  . Hypertension   . Tobacco dependence in remission     100+ pack-yr hx: quit after CABG  . Obesity   . Hyperplastic colon polyp 2001  . Erectile dysfunction     Normal testosterone  . Adenomatous colon polyp 10/16/2011    Repeat 2018  . 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 pylori neg, no dysplasia or malignancy.  . Macular degeneration, dry     Mild, bilat (Optometrist, Mayford Knife at [facility] [place] in [place], [place])  . Iron deficiency anemia 2014    03/2012 capsule endoscopy showed 2 AVMs--likely responsible for his IDA--lifetime iron supp recommended + q63moCBCs.  . 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 COPD (chronic obstructive pulmonary disease) ([place]     Spirometry  2004 borderline obstruction  . Hyperlipidemia   . 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 Other and unspecified angina pectoris   . DOE (dyspnea on exertion)     COPD + chronic diastolic HF  . Pneumonia   . DM type 2 (diabetes mellitus, type 2) (HCC)     Poor control on max oral meds--pt eventually agreed to insulin therap  . CAD, multiple vessel     3V CAD cath 08/08/13----CABG done shortly after.  . Chronic renal insufficiency, stage II (mild)   .  Diabetic nephropathy (HCC)     Elevated urine microalb/cr 03/2011  . Hearing loss of both ears 2016    Hearing aids  . Chronic diastolic heart failure ([place]) 2016    Current outpatient prescriptions:  .  acetaminophen (TYLENOL) 325 MG tablet, Take 2 tablets (650 mg total) by mouth every 4 (four) hours as needed for headache or mild pain., Disp: , Rfl:  .  amLODipine  (NORVASC) 10 MG tablet, Take 1 tablet (10 mg total) by mouth daily., Disp: 30 tablet, Rfl: 0 .  aspirin 81 MG tablet, Take 81 mg by mouth at bedtime. , Disp: , Rfl:  .  budesonide-formoterol (SYMBICORT) 160-4.5 MCG/ACT inhaler, Inhale 2 puffs into the lungs 2 (two) times daily., Disp: 1 Inhaler, Rfl: 12 .  buPROPion (ZYBAN) 150 MG 12 hr tablet, Take 150 mg by mouth 2 (two) times daily., Disp: , Rfl:  .  Cholecalciferol (VITAMIN D-3) 1000 UNITS CAPS, Take 1,000 Units by mouth daily. , Disp: , Rfl:  .  Chromium Picolinate 1000 MCG TABS, Take 1,000 mcg by mouth daily. , Disp: , Rfl:  .  citalopram (CELEXA) 40 MG tablet, Take 1 tablet (40 mg total) by mouth daily., Disp: 90 tablet, Rfl: 3 .  furosemide (LASIX) 40 MG tablet, Take 1 tablet (40 mg total) by mouth daily., Disp: 90 tablet, Rfl: 4 .  guaiFENesin (MUCINEX) 600 MG 12 hr tablet, Take 2 tablets (1,200 mg total) by mouth 2 (two) times daily., Disp: 30 tablet, Rfl: 0 .  Insulin Detemir (LEVEMIR FLEXPEN) 100 UNIT/ML Pen, 55 U SQ qhs, Disp: 50 mL, Rfl: 1 .  insulin regular (NOVOLIN R,HUMULIN R) 100 units/mL injection, 8 U SQ q AC (take about 20 min prior to eating) (Patient taking differently: Inject 8 Units into the skin 3 (three) times daily before meals. 8 U SQ q AC (take about 20 min prior to eating)), Disp: 10 mL, Rfl: 11 .  isosorbide mononitrate (IMDUR) 30 MG 24 hr tablet, Take 1 tablet (30 mg total) by mouth daily., Disp: 90 tablet, Rfl: 1 .  lovastatin (MEVACOR) 40 MG tablet, Take 1 tablet (40 mg total) by mouth 2 (two) times daily., Disp: 180 tablet, Rfl: 1 .  metFORMIN (GLUCOPHAGE) 1000 MG tablet, Take 1 tablet (1,000 mg total) by mouth 2 (two) times daily with a meal., Disp: 180 tablet, Rfl: 1 .  Multiple Vitamin (MULTIVITAMIN WITH MINERALS) TABS, Take 1 tablet by mouth daily., Disp: , Rfl:  .  potassium chloride SA (KLOR-CON M20) 20 MEQ tablet, Take 1 tablet (20 mEq total) by mouth daily., Disp: 90 tablet, Rfl: 4 .  ramipril (ALTACE) 10  MG capsule, Take 10 mg by mouth 2 (two) times daily. , Disp: , Rfl:  .  rivaroxaban (XARELTO) 20 MG TABS tablet, Take 1 tablet (20 mg total) by mouth daily with supper., Disp: 90 tablet, Rfl: 4 .  tamsulosin (FLOMAX) 0.4 MG CAPS capsule, TAKE 1 CAPSULE BY MOUTH EVERY DAY AFTER SUPPER, Disp: 90 capsule, Rfl: 1 .  glimepiride (AMARYL) 4 MG tablet, Take 4 mg by mouth 2 (two) times daily., Disp: , Rfl:    Review of Systems  Has dyspnea on exertion, denies any cough, wheezing, sputum production, hemoptysis. Denies any fevers, chills, loss of weight, loss of appetite. Denies any chest pain, palpitations, lower extremity edema. Denies any nausea, vomiting, diarrhea, constipation. All other review of systems are negative    Objective:   Physical Exam Blood pressure 108/60, pulse 78, height 5' 11.5" (1.816  m), weight 244 lb 6.4 oz (110.859 kg), SpO2 93 %.  Gen: No apparent distress Neuro: No gross focal deficits. Neck: No JVD, lymphadenopathy, thyromegaly. RS: Clear, no wheeze or crackles. CVS: S1-S2 heard, no murmurs rubs gallops. Abdomen: Soft, positive bowel sounds. Extremities: No edema.    Assessment & Plan:  #1 Moderate COPD,GOLD Class II. He is doing well on Symbicort after discharge. Is taking Lasix regularly and reports that his lower extremity edema and dyspnea has improved considerably. I have encouraged him to keep off the cigarettes. We discussed adding additional inhalers such as Spiriva. However he feels he feels well now and would like to defer additional therapies. We'll readdress his symptom management at the next visit.  #2 Asbestos exposure He has Asbestos exposure in the 1950s. He feels that this exposure not considerable however he has calcified pleural plaques on CT scan. There is no evidence of pulmonary fibrosis resulting from asbestos. We'll continue to monitor symptoms and that annual imaging with chest x-ray and regular PFTs.  Plan: -Continue Symbicort. - Monitor  lung function with annual PFTs and chest x-ray  Return to clinic in 6 months.   Marshell Garfinkel MD [facility] Pager [phone] If no answer or after 3pm call: [phone] 11/20/2014, 2:38 PM

## 2014-11-21 ENCOUNTER — Encounter: Payer: Self-pay | Admitting: Licensed Clinical Social Worker

## 2014-11-21 ENCOUNTER — Other Ambulatory Visit: Payer: Self-pay | Admitting: Licensed Clinical Social Worker

## 2014-11-21 NOTE — Patient Outreach (Signed)
Assessment:  CSW called home number of client on 11/21/14 and spoke via phone with [name], spouse of client. CSW verified identity of Tarron Krolak. [name] and CSW spoke of client needs.  [name] and CSW spoke of care plan goal for client. [name] said client gets depressed occasionally. She said client likes to take a nap if he is depressed or he likes to watch TV westerns if he feels depressed.  [name] and CSW spoke of care plan possible goal for client.  [name] and CSW developed care plan goal for client.  [name] said that care plan goal developed for client was appropriate at present.  [name] said that client drives himself to medical appointments. She said he has prescribed medications and is taking medications as prescribed.  She said he does have some difficulty walking and uses ambulation assistive devices such as a cane or walker to help him ambulate.  She said he is hard of hearing and uses two hearing aids to help him hear.  CSW gave Ingram Onnen CSW name and phone number. CSW encouraged client/[name] Boeve to call CSW as needed to discuss social work needs of client. CSW thanked [place] for phone call with CSW on 11/21/14.  Plan: Client to use relaxation techniques (take a nap, watch TV westerns) when client feels depressed or has depression symptoms. CSW to call client/[name] Oquinn in three weeks to assess client needs.  [name].Tyjae Shvartsman MSW, LCSW Licensed Clinical Social Worker [facility] Care Management [phone]

## 2014-11-23 ENCOUNTER — Other Ambulatory Visit: Payer: Self-pay | Admitting: *Deleted

## 2014-11-23 NOTE — Patient Outreach (Signed)
Transition of care call to follow up on progress of this week. Pt reports his weight has remained the same at 241. He denies SOB and has a trace of edema. His blood pressures are improved to normal range after, reducing furosemide to 40 mg only and removing the metoprolol from his med box that he was still taking altough it had been discontinued at some time.   He and his wife are wondering why I want him to go back to cardiology and I told them I believe the specialist needs to reassess his heart meds as I believe he would benefit more from being on the metoprolol vs amlodipine and continue his ramipril.  I will see pt at his home then on Monday Dec. 28th. I will call and check in with him next Friday.  Deloria Lair [facility] [place] [phone]

## 2014-11-26 ENCOUNTER — Ambulatory Visit (INDEPENDENT_AMBULATORY_CARE_PROVIDER_SITE_OTHER): Payer: Medicare Other | Admitting: Cardiology

## 2014-11-26 ENCOUNTER — Encounter: Payer: Self-pay | Admitting: Cardiology

## 2014-11-26 VITALS — BP 130/58 | HR 89 | Ht 71.5 in | Wt 248.0 lb

## 2014-11-26 DIAGNOSIS — I5033 Acute on chronic diastolic (congestive) heart failure: Secondary | ICD-10-CM

## 2014-11-26 DIAGNOSIS — E785 Hyperlipidemia, unspecified: Secondary | ICD-10-CM

## 2014-11-26 DIAGNOSIS — I319 Disease of pericardium, unspecified: Secondary | ICD-10-CM

## 2014-11-26 DIAGNOSIS — I480 Paroxysmal atrial fibrillation: Secondary | ICD-10-CM | POA: Diagnosis not present

## 2014-11-26 DIAGNOSIS — I3139 Other pericardial effusion (noninflammatory): Secondary | ICD-10-CM

## 2014-11-26 DIAGNOSIS — I10 Essential (primary) hypertension: Secondary | ICD-10-CM

## 2014-11-26 DIAGNOSIS — I2510 Atherosclerotic heart disease of native coronary artery without angina pectoris: Secondary | ICD-10-CM

## 2014-11-26 DIAGNOSIS — I313 Pericardial effusion (noninflammatory): Secondary | ICD-10-CM

## 2014-11-26 LAB — BASIC METABOLIC PANEL
BUN: 24 mg/dL (ref 7–25)
CALCIUM: 9.2 mg/dL (ref 8.6–10.3)
CO2: 26 mmol/L (ref 20–31)
Chloride: 104 mmol/L (ref 98–110)
Creat: 1.03 mg/dL (ref 0.70–1.18)
GLUCOSE: 150 mg/dL — AB (ref 65–99)
Potassium: 5.3 mmol/L (ref 3.5–5.3)
Sodium: 138 mmol/L (ref 135–146)

## 2014-11-26 LAB — CBC
HEMATOCRIT: 39.1 % (ref 39.0–52.0)
Hemoglobin: 12.1 g/dL — ABNORMAL LOW (ref 13.0–17.0)
MCH: 25.6 pg — AB (ref 26.0–34.0)
MCHC: 30.9 g/dL (ref 30.0–36.0)
MCV: 82.8 fL (ref 78.0–100.0)
MPV: 9.6 fL (ref 8.6–12.4)
Platelets: 170 10*3/uL (ref 150–400)
RBC: 4.72 MIL/uL (ref 4.22–5.81)
RDW: 16.3 % — AB (ref 11.5–15.5)
WBC: 8.4 10*3/uL (ref 4.0–10.5)

## 2014-11-26 MED ORDER — FUROSEMIDE 20 MG PO TABS: 20.0000 mg | ORAL_TABLET | Freq: Every day | ORAL | Status: DC

## 2014-11-26 MED ORDER — DILTIAZEM HCL ER COATED BEADS 240 MG PO CP24: 240.0000 mg | ORAL_CAPSULE | Freq: Every day | ORAL | Status: DC

## 2014-11-26 NOTE — Patient Instructions (Addendum)
Medication Instructions:  1) DECREASE Lasix to 30m once daily starting Monday 12/03/14.  Please call our office if you see increase swelling. 2) CONTINUE Xarelto 3) DISCONTINUE your Amlodipine 4) START Dlitiazem CD 2432monce daily  Labwork: BMET, CBC today  Testing/Procedures: None  Follow-Up: Your physician recommends that you keep your follow-up appointment with Dr. VaIrish Lackn January.  Any Other Special Instructions Will Be Listed Below (If Applicable).     If you need a refill on your cardiac medications before your next appointment, please call your pharmacy.

## 2014-11-26 NOTE — Progress Notes (Signed)
Cardiology Office Note   Date:  11/26/2014   ID:  Joshua, [name] Aug 23, 1940, MRN [id number]  PCP:  Tammi Sou, MD  Cardiologist:  Dr. Irish Lack    No chief complaint on file.     History of Present Illness: Joshua [name] is a 74 y.o. male who presents for follow up of edema  Patient has history of pericardial effusion with pericardial tamponade requiring pericardial centesis  1-1/2 years ago.with that hospitalization he had PAF CHADSVASC=5 (age,CHF,CAD,DM)- none since until most recent hospitalization.     He then underwent CABG 06/2013. He also has diabetes mellitus, and hypertension, CKD stage II and tobacco abuse. Though since last admit he has stopped smoking.   Today his daughter is with him and concerned why he was placed on Xarelto.  At discharge she was not notified of the a fib, and she is very concerned.  We reviewed his EKGs and prior to discharge during the early AM hours he did go into a fib with RVR, EKGs were done and pt converted to  SR by 7 am the day of discharge. I do not know why there is no mention of this in chart.  On post hospital visit, Ms. Joaquim Nam saw the PAF and placed him on Xarelto to prevent stroke.  Daughter now understands reasoning.   The pt feels better and his edema is much improved after increasing lasix to 40 mg.  We stopped BB in the hospital due to COPD exacerbation.  He is on norvasc for HTN.   He denies chest pain and SOB is about the same.  His wife feels he gurgles at night but he is able to be flat.        Past Medical History  Diagnosis Date  . Hypertension   . Tobacco dependence in remission     100+ pack-yr hx: quit after CABG  . Obesity   . Hyperplastic colon polyp 2001  . Erectile dysfunction     Normal testosterone  . Adenomatous colon polyp 10/16/2011    Repeat 2018  . 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 pylori neg, no dysplasia or malignancy.  . Macular degeneration, dry    Mild, bilat (Optometrist, Mayford Knife at [facility] [facility] [place] in [place], [place])  . Iron deficiency anemia 2014    03/2012 capsule endoscopy showed 2 AVMs--likely responsible for his IDA--lifetime iron supp recommended + q7moCBCs.  . 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 COPD (chronic obstructive pulmonary disease) ([place]     GOLD II.  Spirometry  2004 borderline obstruction; 2015 mod obst  . Hyperlipidemia   . 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 Other and unspecified angina pectoris   . DOE (dyspnea on exertion)     COPD + chronic diastolic HF  . Pneumonia   . DM type 2 (diabetes mellitus, type 2) (HCC)     Poor control on max oral meds--pt eventually agreed to insulin therap  . CAD, multiple vessel     3V CAD cath 08/08/13----CABG done shortly after.  . Chronic renal insufficiency, stage II (mild)   . Diabetic nephropathy (HCC)     Elevated urine microalb/cr 03/2011  . Hearing loss of both ears 2016    Hearing aids  . Chronic diastolic heart failure ([place] 2016    Past Surgical History  Procedure Laterality Date  .  Cholecystectomy open  1999  . Appendectomy  1957  . Hip surgery Right 1954    Repair of slipped capital femoral epiphysis.  . Patella fracture surgery Left ~ 1979    bolt + 3 screws to repair tib plateau fx  . Testicle surgery  as a child    Undescended testicle brought down into scrotum  . Tonsillectomy  1947  . Colonoscopy  10/16/2011    Procedure: COLONOSCOPY;  Surgeon: Inda Castle, MD;  Location: [facility];  Service: Endoscopy;  Laterality: N/A;  . Cataract extraction w/ intraocular lens  implant, bilateral  04/08/2006 & 04/22/2006  . Esophagogastroduodenoscopy  02/25/12    Atrophic gastritis with a few erosions--capsule endoscopy planned as of 02/25/12 (Dr. Deatra Ina).  . Total hip arthroplasty Right 08/12/2012    Procedure: REMOVAL OF OLD PINS RIGHT HIP AND RIGHT TOTAL HIP ARTHROPLASTY  ANTERIOR APPROACH;  Surgeon: Mcarthur Rossetti, MD;  Location: [facility] OR[facility];  Service: Orthopedics;  Laterality: Right;  . Hardware removal Right 08/12/2012    Procedure: HARDWARE REMOVAL;  Surgeon: Mcarthur Rossetti, MD;  Location: [facility] OR[facility];  Service: Orthopedics;  Laterality: Right;  . Cardiac catheterization  08/08/2013  . Cataract extraction w/ intraocular lens  implant, bilateral Bilateral   . Coronary artery bypass graft N/A 08/14/2013    Procedure: CORONARY ARTERY BYPASS GRAFTING (CABG);  Surgeon: Melrose Nakayama, MD;  Location: [facility];  Service: Open Heart Surgery;  Laterality: N/A;  Times 4   using left internal mammary artery and endoscopically harvested bilateral saphenous vein  . Intraoperative transesophageal echocardiogram N/A 08/14/2013    Normal LV function. Procedure: INTRAOPERATIVE TRANSESOPHAGEAL ECHOCARDIOGRAM;  Surgeon: Melrose Nakayama, MD;  Location: [facility];  Service: Open Heart Surgery;  Laterality: N/A;  . Pericardial tap N/A 07/01/2013    Procedure: PERICARDIAL TAP;  Surgeon: Jettie Booze, MD;  Location: [facility] CATH LAB;  Service: Cardiovascular;  Laterality: N/A;  . Left heart catheterization with coronary angiogram N/A 08/08/2013    Procedure: LEFT HEART CATHETERIZATION WITH CORONARY ANGIOGRAM;  Surgeon: Jettie Booze, MD;  Location: [facility] CATH LAB;  Service: Cardiovascular;  Laterality: N/A;  . Transthoracic echocardiogram  10/30/14    Mod LVH, EF 60-65%, normal wall motion, mod mitral regurg, mild PAH     Current Outpatient Prescriptions  Medication Sig Dispense Refill  . acetaminophen (TYLENOL) 325 MG tablet Take 2 tablets (650 mg total) by mouth every 4 (four) hours as needed for headache or mild pain.    [name] amLODipine (NORVASC) 10 MG tablet Take 1 tablet (10 mg total) by mouth daily. 30 tablet 0  . aspirin 81 MG tablet Take 81 mg by mouth at bedtime.     . budesonide-formoterol (SYMBICORT) 160-4.5 MCG/ACT inhaler Inhale 2 puffs into the lungs 2 (two) times  daily. 1 Inhaler 12  . buPROPion (ZYBAN) 150 MG 12 hr tablet Take 150 mg by mouth 2 (two) times daily.    . Cholecalciferol (VITAMIN D-3) 1000 UNITS CAPS Take 1,000 Units by mouth daily.     . Chromium Picolinate 1000 MCG TABS Take 1,000 mcg by mouth daily.     . citalopram (CELEXA) 40 MG tablet Take 1 tablet (40 mg total) by mouth daily. 90 tablet 3  . furosemide (LASIX) 40 MG tablet Take 1 tablet (40 mg total) by mouth daily. 90 tablet 4  . glimepiride (AMARYL) 4 MG tablet Take 4 mg by mouth 2 (two) times daily.    [name] guaiFENesin (MUCINEX) 600 MG 12 hr  tablet Take 2 tablets (1,200 mg total) by mouth 2 (two) times daily. 30 tablet 0  . Insulin Detemir (LEVEMIR FLEXPEN) 100 UNIT/ML Pen 55 U SQ qhs 50 mL 1  . insulin regular (NOVOLIN R,HUMULIN R) 100 units/mL injection 8 U SQ q AC (take about 20 min prior to eating) (Patient taking differently: Inject 8 Units into the skin 3 (three) times daily before meals. 8 U SQ q AC (take about 20 min prior to eating)) 10 mL 11  . isosorbide mononitrate (IMDUR) 30 MG 24 hr tablet Take 1 tablet (30 mg total) by mouth daily. 90 tablet 1  . lovastatin (MEVACOR) 40 MG tablet Take 1 tablet (40 mg total) by mouth 2 (two) times daily. 180 tablet 1  . metFORMIN (GLUCOPHAGE) 1000 MG tablet Take 1 tablet (1,000 mg total) by mouth 2 (two) times daily with a meal. 180 tablet 1  . Multiple Vitamin (MULTIVITAMIN WITH MINERALS) TABS Take 1 tablet by mouth daily.    . potassium chloride SA (KLOR-CON M20) 20 MEQ tablet Take 1 tablet (20 mEq total) by mouth daily. 90 tablet 4  . ramipril (ALTACE) 10 MG capsule Take 10 mg by mouth 2 (two) times daily.     . rivaroxaban (XARELTO) 20 MG TABS tablet Take 1 tablet (20 mg total) by mouth daily with supper. 90 tablet 4  . tamsulosin (FLOMAX) 0.4 MG CAPS capsule TAKE 1 CAPSULE BY MOUTH EVERY DAY AFTER SUPPER 90 capsule 1   No current facility-administered medications for this visit.    Allergies:   Morphine and related; Demerol; and  Starlix    Social History:  The patient  reports that he quit smoking about 4 weeks ago. His smoking use included Cigarettes. He has a 62 pack-year smoking history. He quit smokeless tobacco use about 16 months ago. He reports that he does not drink alcohol or use illicit drugs.   Family History:  The patient's family history includes Breast cancer in his sister; CVA in his mother; Diabetes in his maternal uncle and paternal grandmother; Heart attack in his father; Heart disease in his father and maternal uncle; Hypertension in his mother. There is no history of Stroke.    ROS:  General:no colds or fevers, no weight changes Skin:no rashes or ulcers HEENT:no blurred vision, no congestion CV:see HPI PUL:see HPI GI:no diarrhea constipation or melena, no indigestion GU:no hematuria, no dysuria MS:no joint pain, no claudication Neuro:no syncope, no lightheadedness Endo:+ diabetes stable, no thyroid disease  Wt Readings from Last 3 Encounters:  11/26/14 248 lb (112.492 kg)  11/20/14 244 lb 6.4 oz (110.859 kg)  11/19/14 241 lb (109.317 kg)     PHYSICAL EXAM: VS:  BP 130/58 mmHg  Pulse 89  Ht 5' 11.5" (1.816 m)  Wt 248 lb (112.492 kg)  BMI 34.11 kg/m2 , BMI Body mass index is 34.11 kg/(m^2). General:Pleasant affect, NAD Skin:Warm and dry, brisk capillary refill HEENT:normocephalic, sclera clear, mucus membranes moist Neck:supple, no JVD, no bruits  Heart:S1S2 RRR without murmur, gallup, rub or click Lungs:without rales, + rhonchi, no wheezes OIZ:TIWP, non tender, + BS, do not palpate liver spleen or masses Ext:tr lower ext edema, 2+ pedal pulses, 2+ radial pulses Neuro:alert and oriented X 3, MAE, follows commands, + facial symmetry    EKG:  EKG is ordered today. The ekg ordered today demonstrates SR with LBBB no acute changes, previously IC BBB.   Recent Labs: 10/29/2014: ALT 14*; B Natriuretic Peptide 402.0* 11/02/2014: Hemoglobin 11.7*; Platelets 162 11/19/2014: BUN  34*;  Creatinine, Ser 1.20; Potassium 4.6; Sodium 137    Lipid Panel    Component Value Date/Time   CHOL 161 04/17/2014 0826   TRIG 218.0* 04/17/2014 0826   HDL 47.60 04/17/2014 0826   CHOLHDL 3 04/17/2014 0826   VLDL 43.6* 04/17/2014 0826   LDLCALC 63 07/04/2013 0325   LDLDIRECT 77.0 04/17/2014 0826       Other studies Reviewed: Additional studies/ records that were reviewed today include: hospital noted, EKGs, tele, June 2015 and 89/2015 notes. .   ASSESSMENT AND PLAN:  PAF (paroxysmal atrial fibrillation) ([place]) Patient has paroxysmal atrial fibrillation. He is now on anticoagulation. CHADSVASC=5.  He is on Xarelto 20 mg once daily. Follow-up renal function was normal.  Discussed with daughter at length today. Reviewed EKGs- unsure if pt can continue xarleto due to cost, but he has 3 months supply, once he is running low may need to change to coumadin.  Concern for recurrent paf.  Now off BB, will change noravasc to cardizem 240 mg daily.  Discussed with Dr. Caryl Comes, though studies do not show CCB or BB after A fib prevent a fib. Follow up with Dr. Irish Lack in Jan.   Acute on chronic diastolic heart failure [facility]) Patient has weight loss with increase of  Lasix will check BMP and  Continue lasix at 40 until Monday and decrease to 20 mg.  Will decrease potassium to 10 mEq daily.depending on labs.   Recent 2-D echo showed normal LV function EF 60-65%.   Coronary artery disease- 3 V at cath 08/08/13 Stable without chest pain  HTN (hypertension), benign Blood Pressure controlled  Tobacco dependence Patient quit smoking since hospitalization.   Current medicines are reviewed with the patient today.  The patient Has no concerns regarding medicines.  The following changes have been made:  See above Labs/ tests ordered today include:see above  Disposition:   FU:  see above  Lennie Muckle, NP  11/26/2014 10:37 AM    [facility] [place] [place],  [place], [place], [place] Phone: [phone]; Fax: [phone]

## 2014-11-27 ENCOUNTER — Telehealth: Payer: Self-pay

## 2014-11-27 MED ORDER — POTASSIUM CHLORIDE ER 10 MEQ PO TBCR: 10.0000 meq | EXTENDED_RELEASE_TABLET | Freq: Every day | ORAL | Status: DC

## 2014-11-27 MED ORDER — DILTIAZEM HCL ER COATED BEADS 240 MG PO CP24: 240.0000 mg | ORAL_CAPSULE | Freq: Every day | ORAL | Status: DC

## 2014-11-27 NOTE — Telephone Encounter (Signed)
Called patient about lab results. Per Cecilie Kicks NP, decrease kdur to 10 meq daily. Otherwise labs stable. Patient verbalized understanding. Patient stated that his pharmacy did not have his prescription from yesterday. Patient had a mail order pharmacy, so ordered diltiazem to be sent to local pharmacy. Patient verbalized understanding.

## 2014-11-28 ENCOUNTER — Encounter (HOSPITAL_BASED_OUTPATIENT_CLINIC_OR_DEPARTMENT_OTHER): Payer: Self-pay

## 2014-11-28 ENCOUNTER — Other Ambulatory Visit: Payer: Self-pay | Admitting: *Deleted

## 2014-11-28 ENCOUNTER — Emergency Department (HOSPITAL_BASED_OUTPATIENT_CLINIC_OR_DEPARTMENT_OTHER): Payer: Medicare Other

## 2014-11-28 ENCOUNTER — Ambulatory Visit (INDEPENDENT_AMBULATORY_CARE_PROVIDER_SITE_OTHER): Payer: Medicare Other | Admitting: Family Medicine

## 2014-11-28 ENCOUNTER — Inpatient Hospital Stay (HOSPITAL_BASED_OUTPATIENT_CLINIC_OR_DEPARTMENT_OTHER)
Admission: EM | Admit: 2014-11-28 | Discharge: 2014-12-01 | DRG: 603 | Disposition: A | Discharge status: Home / Self Care | Payer: Medicare Other | Source: Ambulatory Visit | Attending: Family Medicine | Admitting: Family Medicine

## 2014-11-28 ENCOUNTER — Encounter: Payer: Self-pay | Admitting: Family Medicine

## 2014-11-28 VITALS — BP 107/53 | HR 89 | Temp 98.0°F | Resp 20 | Wt 229.5 lb

## 2014-11-28 DIAGNOSIS — L03211 Cellulitis of face: Secondary | ICD-10-CM | POA: Insufficient documentation

## 2014-11-28 DIAGNOSIS — N182 Chronic kidney disease, stage 2 (mild): Secondary | ICD-10-CM | POA: Diagnosis not present

## 2014-11-28 DIAGNOSIS — I2510 Atherosclerotic heart disease of native coronary artery without angina pectoris: Secondary | ICD-10-CM | POA: Diagnosis not present

## 2014-11-28 DIAGNOSIS — D509 Iron deficiency anemia, unspecified: Secondary | ICD-10-CM | POA: Diagnosis present

## 2014-11-28 DIAGNOSIS — I130 Hypertensive heart and chronic kidney disease with heart failure and stage 1 through stage 4 chronic kidney disease, or unspecified chronic kidney disease: Secondary | ICD-10-CM | POA: Diagnosis not present

## 2014-11-28 DIAGNOSIS — H1130 Conjunctival hemorrhage, unspecified eye: Secondary | ICD-10-CM | POA: Diagnosis not present

## 2014-11-28 DIAGNOSIS — Z87891 Personal history of nicotine dependence: Secondary | ICD-10-CM | POA: Diagnosis not present

## 2014-11-28 DIAGNOSIS — F329 Major depressive disorder, single episode, unspecified: Secondary | ICD-10-CM | POA: Diagnosis not present

## 2014-11-28 DIAGNOSIS — E785 Hyperlipidemia, unspecified: Secondary | ICD-10-CM | POA: Diagnosis not present

## 2014-11-28 DIAGNOSIS — L0211 Cutaneous abscess of neck: Secondary | ICD-10-CM | POA: Diagnosis not present

## 2014-11-28 DIAGNOSIS — Z951 Presence of aortocoronary bypass graft: Secondary | ICD-10-CM | POA: Diagnosis not present

## 2014-11-28 DIAGNOSIS — R220 Localized swelling, mass and lump, head: Secondary | ICD-10-CM | POA: Diagnosis not present

## 2014-11-28 DIAGNOSIS — E669 Obesity, unspecified: Secondary | ICD-10-CM | POA: Diagnosis present

## 2014-11-28 DIAGNOSIS — Z794 Long term (current) use of insulin: Secondary | ICD-10-CM

## 2014-11-28 DIAGNOSIS — J340 Abscess, furuncle and carbuncle of nose: Secondary | ICD-10-CM

## 2014-11-28 DIAGNOSIS — E1122 Type 2 diabetes mellitus with diabetic chronic kidney disease: Secondary | ICD-10-CM | POA: Diagnosis present

## 2014-11-28 DIAGNOSIS — J449 Chronic obstructive pulmonary disease, unspecified: Secondary | ICD-10-CM | POA: Diagnosis present

## 2014-11-28 DIAGNOSIS — I480 Paroxysmal atrial fibrillation: Secondary | ICD-10-CM | POA: Diagnosis not present

## 2014-11-28 DIAGNOSIS — Z7982 Long term (current) use of aspirin: Secondary | ICD-10-CM | POA: Diagnosis not present

## 2014-11-28 DIAGNOSIS — I5032 Chronic diastolic (congestive) heart failure: Secondary | ICD-10-CM | POA: Diagnosis present

## 2014-11-28 DIAGNOSIS — Z7901 Long term (current) use of anticoagulants: Secondary | ICD-10-CM

## 2014-11-28 DIAGNOSIS — H9190 Unspecified hearing loss, unspecified ear: Secondary | ICD-10-CM | POA: Diagnosis present

## 2014-11-28 DIAGNOSIS — Z6831 Body mass index (BMI) 31.0-31.9, adult: Secondary | ICD-10-CM | POA: Diagnosis not present

## 2014-11-28 DIAGNOSIS — L03221 Cellulitis of neck: Secondary | ICD-10-CM | POA: Diagnosis not present

## 2014-11-28 DIAGNOSIS — H60399 Other infective otitis externa, unspecified ear: Secondary | ICD-10-CM | POA: Insufficient documentation

## 2014-11-28 LAB — BASIC METABOLIC PANEL
ANION GAP: 9 (ref 5–15)
BUN: 30 mg/dL — ABNORMAL HIGH (ref 6–20)
CHLORIDE: 100 mmol/L — AB (ref 101–111)
CO2: 24 mmol/L (ref 22–32)
Calcium: 8.8 mg/dL — ABNORMAL LOW (ref 8.9–10.3)
Creatinine, Ser: 1.09 mg/dL (ref 0.61–1.24)
GFR calc non Af Amer: >60 mL/min (ref >60)
Glucose, Bld: 290 mg/dL — ABNORMAL HIGH (ref 65–99)
Potassium: 4.5 mmol/L (ref 3.5–5.1)
Sodium: 133 mmol/L — ABNORMAL LOW (ref 135–145)

## 2014-11-28 LAB — CBC WITH DIFFERENTIAL/PLATELET
BASOS ABS: 0 10*3/uL (ref 0.0–0.1)
BASOS PCT: 0 %
Eosinophils Absolute: 0 10*3/uL (ref 0.0–0.7)
Eosinophils Relative: 1 %
HEMATOCRIT: 38.1 % — AB (ref 39.0–52.0)
Hemoglobin: 11.6 g/dL — ABNORMAL LOW (ref 13.0–17.0)
LYMPHS PCT: 10 %
Lymphs Abs: 0.9 10*3/uL (ref 0.7–4.0)
MCH: 26 pg (ref 26.0–34.0)
MCHC: 30.4 g/dL (ref 30.0–36.0)
MCV: 85.2 fL (ref 78.0–100.0)
Monocytes Absolute: 1.2 10*3/uL — ABNORMAL HIGH (ref 0.1–1.0)
Monocytes Relative: 14 %
NEUTROS ABS: 6.4 10*3/uL (ref 1.7–7.7)
NEUTROS PCT: 75 %
Platelets: 162 10*3/uL (ref 150–400)
RBC: 4.47 MIL/uL (ref 4.22–5.81)
RDW: 17.2 % — ABNORMAL HIGH (ref 11.5–15.5)
WBC: 8.5 10*3/uL (ref 4.0–10.5)

## 2014-11-28 LAB — PROTIME-INR
INR: 2.63 — AB (ref 0.00–1.49)
PROTHROMBIN TIME: 27.7 s — AB (ref 11.6–15.2)

## 2014-11-28 LAB — GLUCOSE, CAPILLARY: Glucose-Capillary: 331 mg/dL — ABNORMAL HIGH (ref 65–99)

## 2014-11-28 LAB — CBG MONITORING, ED: Glucose-Capillary: 278 mg/dL — ABNORMAL HIGH (ref 65–99)

## 2014-11-28 MED ORDER — DILTIAZEM HCL ER COATED BEADS 240 MG PO CP24
240.0000 mg | ORAL_CAPSULE | Freq: Every day | ORAL | Status: DC
  Administered 2014-11-29 – 2014-12-01 (×3): 240 mg via ORAL
  Filled 2014-11-28 (×3): qty 1

## 2014-11-28 MED ORDER — BUDESONIDE-FORMOTEROL FUMARATE 160-4.5 MCG/ACT IN AERO
2.0000 | INHALATION_SPRAY | Freq: Two times a day (BID) | RESPIRATORY_TRACT | Status: DC
  Administered 2014-11-28 – 2014-12-01 (×6): 2 via RESPIRATORY_TRACT
  Filled 2014-11-28: qty 6

## 2014-11-28 MED ORDER — ASPIRIN EC 81 MG PO TBEC
81.0000 mg | DELAYED_RELEASE_TABLET | Freq: Every day | ORAL | Status: DC
  Administered 2014-11-28 – 2014-11-30 (×3): 81 mg via ORAL
  Filled 2014-11-28 (×3): qty 1

## 2014-11-28 MED ORDER — METFORMIN HCL 500 MG PO TABS: 1000.0000 mg | ORAL_TABLET | Freq: Two times a day (BID) | ORAL | Status: DC

## 2014-11-28 MED ORDER — PIPERACILLIN-TAZOBACTAM 3.375 G IVPB 30 MIN
3.3750 g | Freq: Once | INTRAVENOUS | Status: AC
  Administered 2014-11-28: 3.375 g via INTRAVENOUS
  Filled 2014-11-28 (×2): qty 50

## 2014-11-28 MED ORDER — RAMIPRIL 10 MG PO CAPS
10.0000 mg | ORAL_CAPSULE | Freq: Two times a day (BID) | ORAL | Status: DC
  Administered 2014-11-28 – 2014-12-01 (×5): 10 mg via ORAL
  Filled 2014-11-28 (×8): qty 1

## 2014-11-28 MED ORDER — IOHEXOL 300 MG/ML  SOLN
50.0000 mL | INTRAMUSCULAR | Status: DC | PRN
  Administered 2014-11-28: 50 mL via INTRAVENOUS

## 2014-11-28 MED ORDER — VANCOMYCIN HCL 10 G IV SOLR
2000.0000 mg | Freq: Once | INTRAVENOUS | Status: AC
  Administered 2014-11-28: 2000 mg via INTRAVENOUS
  Filled 2014-11-28: qty 2000

## 2014-11-28 MED ORDER — PIPERACILLIN-TAZOBACTAM 3.375 G IVPB
3.3750 g | Freq: Three times a day (TID) | INTRAVENOUS | Status: DC
  Administered 2014-11-28 – 2014-11-30 (×6): 3.375 g via INTRAVENOUS
  Filled 2014-11-28 (×8): qty 50

## 2014-11-28 MED ORDER — INSULIN ASPART 100 UNIT/ML ~~LOC~~ SOLN: 0.0000 [IU] | SUBCUTANEOUS | Status: DC

## 2014-11-28 MED ORDER — VANCOMYCIN HCL IN DEXTROSE 750-5 MG/150ML-% IV SOLN
750.0000 mg | Freq: Two times a day (BID) | INTRAVENOUS | Status: DC
  Administered 2014-11-29 – 2014-11-30 (×3): 750 mg via INTRAVENOUS
  Filled 2014-11-28 (×6): qty 150

## 2014-11-28 MED ORDER — INSULIN ASPART 100 UNIT/ML ~~LOC~~ SOLN
0.0000 [IU] | Freq: Three times a day (TID) | SUBCUTANEOUS | Status: DC
  Administered 2014-11-29: 2 [IU] via SUBCUTANEOUS
  Administered 2014-11-29: 5 [IU] via SUBCUTANEOUS

## 2014-11-28 MED ORDER — CIPROFLOXACIN-DEXAMETHASONE 0.3-0.1 % OT SUSP: 4.0000 [drp] | Freq: Two times a day (BID) | OTIC | Status: DC

## 2014-11-28 MED ORDER — INSULIN DETEMIR 100 UNIT/ML ~~LOC~~ SOLN
22.0000 [IU] | Freq: Every day | SUBCUTANEOUS | Status: DC
  Administered 2014-11-28: 22 [IU] via SUBCUTANEOUS
  Filled 2014-11-28 (×2): qty 0.22

## 2014-11-28 MED ORDER — ISOSORBIDE MONONITRATE ER 30 MG PO TB24
30.0000 mg | ORAL_TABLET | Freq: Every day | ORAL | Status: DC
  Administered 2014-11-29 – 2014-12-01 (×3): 30 mg via ORAL
  Filled 2014-11-28 (×3): qty 1

## 2014-11-28 MED ORDER — BUPROPION HCL ER (SR) 150 MG PO TB12
150.0000 mg | ORAL_TABLET | Freq: Two times a day (BID) | ORAL | Status: DC
  Administered 2014-11-28 – 2014-12-01 (×6): 150 mg via ORAL
  Filled 2014-11-28 (×6): qty 1

## 2014-11-28 MED ORDER — TAMSULOSIN HCL 0.4 MG PO CAPS
0.4000 mg | ORAL_CAPSULE | Freq: Every day | ORAL | Status: DC
  Administered 2014-11-29 – 2014-12-01 (×3): 0.4 mg via ORAL
  Filled 2014-11-28 (×3): qty 1

## 2014-11-28 MED ORDER — VANCOMYCIN HCL 500 MG IV SOLR
INTRAVENOUS | Status: AC
  Administered 2014-11-28: 17:00:00
  Filled 2014-11-28: qty 2000

## 2014-11-28 MED ORDER — TRAMADOL HCL 50 MG PO TABS
50.0000 mg | ORAL_TABLET | Freq: Four times a day (QID) | ORAL | Status: DC | PRN
  Administered 2014-11-28 – 2014-11-29 (×2): 50 mg via ORAL
  Filled 2014-11-28 (×2): qty 1

## 2014-11-28 MED ORDER — CITALOPRAM HYDROBROMIDE 20 MG PO TABS
40.0000 mg | ORAL_TABLET | Freq: Every day | ORAL | Status: DC
  Administered 2014-11-29 – 2014-12-01 (×3): 40 mg via ORAL
  Filled 2014-11-28 (×3): qty 2

## 2014-11-28 MED ORDER — SODIUM CHLORIDE 0.9 % IV SOLN
INTRAVENOUS | Status: DC
  Administered 2014-11-28 – 2014-11-30 (×3): via INTRAVENOUS

## 2014-11-28 MED ORDER — ACETAMINOPHEN 325 MG PO TABS
650.0000 mg | ORAL_TABLET | Freq: Once | ORAL | Status: AC
  Administered 2014-11-28: 650 mg via ORAL
  Filled 2014-11-28: qty 2

## 2014-11-28 MED ORDER — ISOSORBIDE MONONITRATE ER 30 MG PO TB24: 30.0000 mg | ORAL_TABLET | Freq: Every day | ORAL | Status: DC

## 2014-11-28 MED ORDER — DIPHENHYDRAMINE HCL 50 MG/ML IJ SOLN: 25.0000 mg | Freq: Once | INTRAMUSCULAR | Status: DC

## 2014-11-28 NOTE — Progress Notes (Signed)
ANTIBIOTIC CONSULT NOTE - INITIAL  Pharmacy Consult for vancomycin + zosyn Indication: cellulitis/abscess  Allergies  Allergen Reactions  . Morphine And Related Other (See Comments)    Drenched with perspiration  . Demerol [Meperidine] Nausea Only  . Starlix [Nateglinide] Other (See Comments)    gassy    Patient Measurements: Height: 5' 11.5" (181.6 cm) Weight: 229 lb 7 oz (104.072 kg) IBW/kg (Calculated) : 76.45  Vital Signs: Temp: 98.3 F (36.8 C) (11/23 1152) Temp Source: Oral (11/23 1152) BP: 135/56 mmHg (11/23 1152) Pulse Rate: 91 (11/23 1152) Intake/Output from previous day:   Intake/Output from this shift:    Labs:  Recent Labs  11/26/14 1145 11/28/14 1235  WBC 8.4 8.5  HGB 12.1* 11.6*  PLT 170 162  CREATININE 1.03 1.09   Estimated Creatinine Clearance: 72.5 mL/min (by C-G formula based on Cr of 1.09). No results for input(s): VANCOTROUGH, VANCOPEAK, VANCORANDOM, GENTTROUGH, GENTPEAK, GENTRANDOM, TOBRATROUGH, TOBRAPEAK, TOBRARND, AMIKACINPEAK, AMIKACINTROU, AMIKACIN in the last 72 hours.   Microbiology: No results found for this or any previous visit (from the past 720 hour(s)).  Medical History: Past Medical History  Diagnosis Date  . Hypertension   . Tobacco dependence in remission     100+ pack-yr hx: quit after CABG  . Obesity   . Hyperplastic colon polyp 2001  . Erectile dysfunction     Normal testosterone  . Adenomatous colon polyp 10/16/2011    Repeat 2018  . 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 pylori neg, no dysplasia or malignancy.  . Macular degeneration, dry     Mild, bilat (Optometrist, Mayford Knife at [employer] [facility] [place] in [place], [place])  . Iron deficiency anemia 2014    03/2012 capsule endoscopy showed 2 AVMs--likely responsible for his IDA--lifetime iron supp recommended + q25moCBCs.  . 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 COPD (chronic obstructive pulmonary disease) ([place]     GOLD II.   Spirometry  2004 borderline obstruction; 2015 mod obst  . Hyperlipidemia   . Pericardial effusion with cardiac tamponade 6//29/15    periocardialcentesis was done,  Infectious/inflamm (cytology showed NO MALIGNANT CELLS)  . Other and unspecified angina pectoris   . DOE (dyspnea on exertion)     COPD + chronic diastolic HF  . Pneumonia   . DM type 2 (diabetes mellitus, type 2) (HCC)     Poor control on max oral meds--pt eventually agreed to insulin therap  . CAD, multiple vessel     3V CAD cath 08/08/13----CABG done shortly after.  . Chronic renal insufficiency, stage II (mild)   . Diabetic nephropathy (HCC)     Elevated urine microalb/cr 03/2011  . Hearing loss of both ears 2016    Hearing aids  . Chronic diastolic heart failure ([place] 2016    Assessment: 74yo m presenting to the ED on 11/23 with right facial swelling since last night.  Pt has been having pain under his eyes with swelling and bloody discharge from nose. Pt presents to the ED for a CT scan to r/o abscess.  CT showed extensive soft tissue swelling with a low-density fluid collection adjacent to the right-sided maxilla.  Pharmacy is consulted to dose vancomycin and zosyn for cellulitis/abscess.  Wbc 8.5, tmax 98.3, SCr 1.09, CrCl ~ 72 ml/min.   Vanc 11/23 >> Zosyn 11/23 >>  Goal of Therapy:  Vancomycin trough level 10-15 mcg/ml  Plan:  Vancomycin 2,000 mg load x 1  Vancomycin 750 mg IV q12h Zosyn 3.375 gm IV x 1 (30-min infusion) Zosyn 3.375 gm IV q8h (4-hr infusion) F/u LOT, de-escalate when clinically possible Monitor renal fx, cbc, clinical course  Sanya Kobrin L. Nicole Kindred, PharmD PGY2 Infectious Diseases Pharmacy Resident Pager: [phone] 11/28/2014 1:56 PM

## 2014-11-28 NOTE — H&P (Signed)
Reason for Consult: Facial cellulitis  HPI:  Joshua [name] is an 74 y.o. male who presents to the [facility] ER today complaining of right sided facial swelling.The patient has a history of anemia, DM, diastolic heart failure, and previous CABG. The patient states that he started experiencing pain under his right eye last night, and his right face started swelling with increase in pain this morning upon wakening. Patient describes the pain as throbbing, rates it 4 out of 10, and radiates down into his right upper jaw. Patient denies fever/chills, nausea/ vomiting, headache, dizziness, or any other complaints. His facial CT shows significant facial swelling, c/w cellulitis.  Pt admitted for IV abx treatment.  Past Medical History  Diagnosis Date  . Hypertension   . Tobacco dependence in remission     100+ pack-yr hx: quit after CABG  . Obesity   . Hyperplastic colon polyp 2001  . Erectile dysfunction     Normal testosterone  . Adenomatous colon polyp 10/16/2011    Repeat 2018  . 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 pylori neg, no dysplasia or malignancy.  . Macular degeneration, dry     Mild, bilat (Optometrist, Mayford Knife at [facility] [facility] [place] in [place], [place])  . Iron deficiency anemia 2014    03/2012 capsule endoscopy showed 2 AVMs--likely responsible for his IDA--lifetime iron supp recommended + q101moCBCs.  . 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 COPD (chronic obstructive pulmonary disease) ([place]     GOLD II.  Spirometry  2004 borderline obstruction; 2015 mod obst  . Hyperlipidemia   . Pericardial effusion with cardiac tamponade 6//29/15    periocardialcentesis was done,  Infectious/inflamm (cytology showed NO MALIGNANT CELLS)  . Other and unspecified angina pectoris   . DOE (dyspnea on exertion)     COPD + chronic diastolic HF  . Pneumonia   . DM type 2 (diabetes mellitus, type 2) (HCC)     Poor control on max oral meds--pt  eventually agreed to insulin therap  . CAD, multiple vessel     3V CAD cath 08/08/13----CABG done shortly after.  . Chronic renal insufficiency, stage II (mild)   . Diabetic nephropathy (HCC)     Elevated urine microalb/cr 03/2011  . Hearing loss of both ears 2016    Hearing aids  . Chronic diastolic heart failure ([place] 2016    Past Surgical History  Procedure Laterality Date  . Cholecystectomy open  1999  . Appendectomy  1957  . Hip surgery Right 1954    Repair of slipped capital femoral epiphysis.  . Patella fracture surgery Left ~ 1979    bolt + 3 screws to repair tib plateau fx  . Testicle surgery  as a child    Undescended testicle brought down into scrotum  . Tonsillectomy  1947  . Colonoscopy  10/16/2011    Procedure: COLONOSCOPY;  Surgeon: RInda Castle MD;  Location: [facility];  Service: Endoscopy;  Laterality: N/A;  . Cataract extraction w/ intraocular lens  implant, bilateral  04/08/2006 & 04/22/2006  . Esophagogastroduodenoscopy  02/25/12    Atrophic gastritis with a few erosions--capsule endoscopy planned as of 02/25/12 (Dr. KDeatra Ina.  . 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 Hardware removal Right  08/12/2012    Procedure: HARDWARE REMOVAL;  Surgeon: Mcarthur Rossetti, MD;  Location: [facility] ORS;  Service: Orthopedics;  Laterality: Right;  . Cardiac catheterization  08/08/2013  . Cataract extraction w/ intraocular lens  implant, bilateral Bilateral   . Coronary artery bypass graft N/A 08/14/2013    Procedure: CORONARY ARTERY BYPASS GRAFTING (CABG);  Surgeon: Melrose Nakayama, MD;  Location: [facility];  Service: Open Heart Surgery;  Laterality: N/A;  Times 4   using left internal mammary artery and endoscopically harvested bilateral saphenous vein  . Intraoperative transesophageal  echocardiogram N/A 08/14/2013    Normal LV function. Procedure: INTRAOPERATIVE TRANSESOPHAGEAL ECHOCARDIOGRAM;  Surgeon: Melrose Nakayama, MD;  Location: [facility];  Service: Open Heart Surgery;  Laterality: N/A;  . Pericardial tap N/A 07/01/2013    Procedure: PERICARDIAL TAP;  Surgeon: Jettie Booze, MD;  Location: [facility] CATH LAB;  Service: Cardiovascular;  Laterality: N/A;  . Left heart catheterization with coronary angiogram N/A 08/08/2013    Procedure: LEFT HEART CATHETERIZATION WITH CORONARY ANGIOGRAM;  Surgeon: Jettie Booze, MD;  Location: [facility] CATH LAB;  Service: Cardiovascular;  Laterality: N/A;  . Transthoracic echocardiogram  10/30/14    Mod LVH, EF 60-65%, normal wall motion, mod mitral regurg, mild PAH    Family History  Problem Relation Age of Onset  . Breast cancer Sister   . Diabetes Maternal Uncle   . Diabetes Paternal Grandmother   . Heart disease Father   . Heart disease Maternal Uncle   . CVA Mother   . Heart attack Father   . Hypertension Mother   . Stroke Neg Hx     Social History:  reports that he quit smoking about 4 weeks ago. His smoking use included Cigarettes. He has a 62 pack-year smoking history. He quit smokeless tobacco use about 16 months ago. He reports that he does not drink alcohol or use illicit drugs.  Allergies:  Allergies  Allergen Reactions  . Morphine And Related Other (See Comments)    Drenched with perspiration  . Demerol [Meperidine] Nausea Only  . Starlix [Nateglinide] Other (See Comments)    gassy    Prior to Admission medications   Medication Sig Start Date End Date Taking? Authorizing Provider  acetaminophen (TYLENOL) 325 MG tablet Take 2 tablets (650 mg total) by mouth every 4 (four) hours as needed for headache or mild pain. 08/09/13  Yes Erlene Quan, PA-C  aspirin 81 MG tablet Take 81 mg by mouth at bedtime.    Yes Historical Provider, MD  budesonide-formoterol (SYMBICORT) 160-4.5 MCG/ACT inhaler Inhale 2 puffs into the  lungs 2 (two) times daily. 06/08/13  Yes Tammi Sou, MD  buPROPion (ZYBAN) 150 MG 12 hr tablet Take 150 mg by mouth 2 (two) times daily.   Yes Historical Provider, MD  Cholecalciferol (VITAMIN D-3) 1000 UNITS CAPS Take 1,000 Units by mouth daily.    Yes Historical Provider, MD  Chromium Picolinate 1000 MCG TABS Take 1,000 mcg by mouth daily.    Yes Historical Provider, MD  citalopram (CELEXA) 40 MG tablet Take 1 tablet (40 mg total) by mouth daily. 07/23/14  Yes Tammi Sou, MD  diltiazem (CARDIZEM CD) 240 MG 24 hr capsule Take 1 capsule (240 mg total) by mouth daily. 11/27/14  Yes Isaiah Serge, NP  furosemide (LASIX) 20 MG tablet Take 1 tablet (20 mg total) by mouth daily. 11/26/14  Yes Isaiah Serge, NP  glimepiride (AMARYL) 4 MG tablet Take 4 mg by mouth  2 (two) times daily. 11/16/14  Yes Historical Provider, MD  Insulin Detemir (LEVEMIR FLEXPEN) 100 UNIT/ML Pen 55 U SQ qhs Patient taking differently: Inject 55 Units into the skin daily at 10 pm. 55 U SQ qhs 11/19/14  Yes Tammi Sou, MD  insulin regular (NOVOLIN R,HUMULIN R) 100 units/mL injection 8 U SQ q AC (take about 20 min prior to eating) Patient taking differently: Inject 8 Units into the skin 3 (three) times daily before meals. 8 U SQ q AC (take about 20 min prior to eating) 07/23/14  Yes Tammi Sou, MD  isosorbide mononitrate (IMDUR) 30 MG 24 hr tablet Take 1 tablet (30 mg total) by mouth daily. 05/24/14  Yes Tammi Sou, MD  lovastatin (MEVACOR) 40 MG tablet Take 1 tablet (40 mg total) by mouth 2 (two) times daily. 05/24/14  Yes Tammi Sou, MD  metFORMIN (GLUCOPHAGE) 1000 MG tablet Take 1 tablet (1,000 mg total) by mouth 2 (two) times daily with a meal. 05/24/14  Yes Tammi Sou, MD  Multiple Vitamin (MULTIVITAMIN WITH MINERALS) TABS Take 1 tablet by mouth daily.   Yes Historical Provider, MD  potassium chloride (K-DUR) 10 MEQ tablet Take 1 tablet (10 mEq total) by mouth daily. 11/27/14  Yes Isaiah Serge, NP  ramipril (ALTACE) 10 MG capsule Take 10 mg by mouth 2 (two) times daily.  09/11/14  Yes Historical Provider, MD  rivaroxaban (XARELTO) 20 MG TABS tablet Take 1 tablet (20 mg total) by mouth daily with supper. 11/12/14  Yes Imogene Burn, PA-C  tamsulosin (FLOMAX) 0.4 MG CAPS capsule TAKE 1 CAPSULE BY MOUTH EVERY DAY AFTER SUPPER 05/24/14  Yes Tammi Sou, MD  guaiFENesin (MUCINEX) 600 MG 12 hr tablet Take 2 tablets (1,200 mg total) by mouth 2 (two) times daily. 11/02/14   Verlee Monte, MD    Medications:  I have reviewed the patient's current medications. Scheduled: . aspirin EC  81 mg Oral QHS  . budesonide-formoterol  2 puff Inhalation BID  . buPROPion  150 mg Oral BID  . [START ON 11/29/2014] citalopram  40 mg Oral Daily  . [START ON 11/29/2014] diltiazem  240 mg Oral Daily  . [START ON 11/29/2014] insulin aspart  0-9 Units Subcutaneous TID WC  . insulin detemir  22 Units Subcutaneous Q2200  . [START ON 11/29/2014] isosorbide mononitrate  30 mg Oral Daily  . metFORMIN  1,000 mg Oral BID WC  . piperacillin-tazobactam (ZOSYN)  IV  3.375 g Intravenous Q8H  . ramipril  10 mg Oral BID  . [START ON 11/29/2014] tamsulosin  0.4 mg Oral QPC breakfast  . [START ON 11/29/2014] vancomycin  750 mg Intravenous Q12H   GYI:RSWNIOE, traMADol  Results for orders placed or performed during the hospital encounter of 11/28/14 (from the past 48 hour(s))  CBC with Differential     Status: Abnormal   Collection Time: 11/28/14 12:35 PM  Result Value Ref Range   WBC 8.5 4.0 - 10.5 K/uL   RBC 4.47 4.22 - 5.81 MIL/uL   Hemoglobin 11.6 (L) 13.0 - 17.0 g/dL   HCT 38.1 (L) 39.0 - 52.0 %   MCV 85.2 78.0 - 100.0 fL   MCH 26.0 26.0 - 34.0 pg   MCHC 30.4 30.0 - 36.0 g/dL   RDW 17.2 (H) 11.5 - 15.5 %   Platelets 162 150 - 400 K/uL   Neutrophils Relative % 75 %   Neutro Abs 6.4 1.7 - 7.7 K/uL   Lymphocytes  Relative 10 %   Lymphs Abs 0.9 0.7 - 4.0 K/uL   Monocytes Relative 14 %   Monocytes  Absolute 1.2 (H) 0.1 - 1.0 K/uL   Eosinophils Relative 1 %   Eosinophils Absolute 0.0 0.0 - 0.7 K/uL   Basophils Relative 0 %   Basophils Absolute 0.0 0.0 - 0.1 K/uL  Basic metabolic panel     Status: Abnormal   Collection Time: 11/28/14 12:35 PM  Result Value Ref Range   Sodium 133 (L) 135 - 145 mmol/L   Potassium 4.5 3.5 - 5.1 mmol/L   Chloride 100 (L) 101 - 111 mmol/L   CO2 24 22 - 32 mmol/L   Glucose, Bld 290 (H) 65 - 99 mg/dL   BUN 30 (H) 6 - 20 mg/dL   Creatinine, Ser 1.09 0.61 - 1.24 mg/dL   Calcium 8.8 (L) 8.9 - 10.3 mg/dL   GFR calc non Af Amer >60 >60 mL/min   GFR calc Af Amer >60 >60 mL/min    Comment: (NOTE) The eGFR has been calculated using the CKD EPI equation. This calculation has not been validated in all clinical situations. eGFR's persistently <60 mL/min signify possible Chronic Kidney Disease.    Anion gap 9 5 - 15  Protime-INR     Status: Abnormal   Collection Time: 11/28/14 12:35 PM  Result Value Ref Range   Prothrombin Time 27.7 (H) 11.6 - 15.2 seconds   INR 2.63 (H) 0.00 - 1.49  POC CBG, ED     Status: Abnormal   Collection Time: 11/28/14  3:04 PM  Result Value Ref Range   Glucose-Capillary 278 (H) 65 - 99 mg/dL    Ct Head W Wo Contrast  11/28/2014  CLINICAL DATA:  Right-sided facial swelling and pain began yesterday. Swelling about the right eye. EXAM: CT HEAD WITHOUT AND WITH CONTRAST CT MAXILLOFACIAL WITH CONTRAST TECHNIQUE: Multidetector CT imaging of the head was performed following the standard protocol before and after bolus injection of intravenous contrast. Multidetector CT imaging of the face were performed following the standard protocol during bolus administration of intravenous contrast. CONTRAST:  72m OMNIPAQUE IOHEXOL 300 MG/ML  SOLN COMPARISON:  None. FINDINGS: CT HEAD FINDINGS A remote lacunar infarct is present at the right globus pallidus. The basal ganglia are otherwise intact. The insular ribbon is normal bilaterally. The cortex is  intact. No acute infarct, hemorrhage, or mass lesion is present. The ventricles are of normal size. No significant extra-axial fluid collection is present. The paranasal sinuses and mastoid air cells are clear. Atherosclerotic calcifications are present within the cavernous internal carotid arteries. Extensive soft tissue CT MAXILLOFACIAL FINDINGS Extensive subcutaneous edema is present over the right side of the face. A polyp or mucous retention cyst is present in the right maxillary sinus. There is mild mucosal thickening in the right maxillary sinus. The paranasal sinuses are otherwise clear. Edematous changes are noted about the right submandibular gland. There is no evidence for duct obstruction. The salivary glands are otherwise within normal limits. Soft tissue swelling extends to the inferior right periorbital soft tissues. There is no retro-orbital stranding or inflammation. No discrete abscess is present. Although the patient is edentulous, the center the inflammation appears to be adjacent to the right side of the maxilla or a subperiosteal fluid collection measures 2.5 x 1.0 cm. No focal tooth root infection is associated. The mandible is intact and located. Degenerative anterolisthesis at C3-4 measures 4.5 mm. IMPRESSION: 1. Extensive edema and soft tissue swelling over the right  side of face. 2. The center of the inflammatory process appears to be a low-density fluid collection adjacent to the right-sided the maxilla measuring 2.5 x 1.0 cm on the axillary images. 3. This could be related to right-sided maxillary sinus disease although the sinus disease is minimal. 4. The patient is edentulous without an odontogenic source for the infection. 5. The inflammatory changes extend to the inferior aspect of the right orbit. 6. 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 7. No acute intracranial abnormality. There is no evidence for intracranial extension of inflammatory process. 8. Grade 1  anterolisthesis at C3-4 measures 4.5 mm. Electronically Signed   By: San Morelle M.D.   On: 11/28/2014 13:42   Ct Maxillofacial W/cm  11/28/2014  CLINICAL DATA:  Right-sided facial swelling and pain began yesterday. Swelling about the right eye. EXAM: CT HEAD WITHOUT AND WITH CONTRAST CT MAXILLOFACIAL WITH CONTRAST TECHNIQUE: Multidetector CT imaging of the head was performed following the standard protocol before and after bolus injection of intravenous contrast. Multidetector CT imaging of the face were performed following the standard protocol during bolus administration of intravenous contrast. CONTRAST:  38m OMNIPAQUE IOHEXOL 300 MG/ML  SOLN COMPARISON:  None. FINDINGS: CT HEAD FINDINGS A remote lacunar infarct is present at the right globus pallidus. The basal ganglia are otherwise intact. The insular ribbon is normal bilaterally. The cortex is intact. No acute infarct, hemorrhage, or mass lesion is present. The ventricles are of normal size. No significant extra-axial fluid collection is present. The paranasal sinuses and mastoid air cells are clear. Atherosclerotic calcifications are present within the cavernous internal carotid arteries. Extensive soft tissue CT MAXILLOFACIAL FINDINGS Extensive subcutaneous edema is present over the right side of the face. A polyp or mucous retention cyst is present in the right maxillary sinus. There is mild mucosal thickening in the right maxillary sinus. The paranasal sinuses are otherwise clear. Edematous changes are noted about the right submandibular gland. There is no evidence for duct obstruction. The salivary glands are otherwise within normal limits. Soft tissue swelling extends to the inferior right periorbital soft tissues. There is no retro-orbital stranding or inflammation. No discrete abscess is present. Although the patient is edentulous, the center the inflammation appears to be adjacent to the right side of the maxilla or a subperiosteal  fluid collection measures 2.5 x 1.0 cm. No focal tooth root infection is associated. The mandible is intact and located. Degenerative anterolisthesis at C3-4 measures 4.5 mm. IMPRESSION: 1. Extensive edema and soft tissue swelling over the right side of face. 2. The center of the inflammatory process appears to be a low-density fluid collection adjacent to the right-sided the maxilla measuring 2.5 x 1.0 cm on the axillary images. 3. This could be related to right-sided maxillary sinus disease although the sinus disease is minimal. 4. The patient is edentulous without an odontogenic source for the infection. 5. The inflammatory changes extend to the inferior aspect of the right orbit. 6. 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 7. No acute intracranial abnormality. There is no evidence for intracranial extension of inflammatory process. 8. Grade 1 anterolisthesis at C3-4 measures 4.5 mm. Electronically Signed   By: CSan MorelleM.D.   On: 11/28/2014 13:42   Review of Systems Negative, with the exception of above mentioned in HPI  Blood pressure 146/56, pulse 102, temperature 99.6 F (37.6 C), temperature source Oral, resp. rate 18, height 5' 11.5" (1.816 m), weight 104.072 kg (229 lb 7 oz), SpO2 96 %.  Physical Exam  Constitutional: He is oriented to person, place, and time. He appears well-developed and well-nourished. No distress.  Head: Normocephalic and atraumatic.  Eyes: Conjunctivae and EOM are normal. Pupils are equal, round, and reactive to light.  Ears: Normal auricles and EACs. Moderate right midface induration. No purulent drainage. Mouth: Normal mucosa. FOM soft to palpation. No trismus.  Neck: Normal range of motion. Neck supple.  Cardiovascular: Normal rate, regular rhythm and normal heart sounds.  Pulmonary/Chest: Effort normal and breath sounds normal. No respiratory distress.  Neurological: He is alert and oriented to person, place, and time.  Skin: Skin is warm  and dry. He is not diaphoretic.  Nursing note and vitals reviewed.  Assessment/Plan: Right facial cellulitis. Agree with IV abx. No surgical intervention at this time. Will follow.  Baraka Klatt,SUI W 11/28/2014, 9:12 PM

## 2014-11-28 NOTE — ED Notes (Signed)
MD at bedside. 

## 2014-11-28 NOTE — Discharge Instructions (Signed)
You have been seen today for facial swelling. Your imaging and lab tests showed no abnormalities. Follow up with PCP as needed. Return to ED should symptoms worsen.

## 2014-11-28 NOTE — ED Provider Notes (Signed)
CSN: [id number]     Arrival date & time 11/28/14  1146 History   First MD Initiated Contact with Patient 11/28/14 1155     Chief Complaint  Patient presents with  . Facial Swelling     (Consider location/radiation/quality/duration/timing/severity/associated sxs/prior Treatment) HPI   Joshua [name] is a 74 y.o. male, with a history of anemia, DM, diastolic heart failure, and CABG one year ago, presenting to the ED with right facial swelling since last night. Pt states he started to have pain under his right eye last night and then it started swelling and increase in pain this morning upon wakening. Patient states the pain is a throbbing, rates it 4 out of 10, and radiates down into his right upper jaw. Patient also complaints of some bloody discharge today from the right nare. Patient denies fever/chills, nausea/vomiting, headache, dizziness, or any other complaints. Patient has no history of heart valve replacement. Patient was sent here by his PCP, Howard Pouch, DO [phone]), who evaluated the patient in her office today and sent the patient to the ED for a CT scan to rule out sinus abscess.    Past Medical History  Diagnosis Date  . Hypertension   . Tobacco dependence in remission     100+ pack-yr hx: quit after CABG  . Obesity   . Hyperplastic colon polyp 2001  . Erectile dysfunction     Normal testosterone  . Adenomatous colon polyp 10/16/2011    Repeat 2018  . 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 pylori neg, no dysplasia or malignancy.  . Macular degeneration, dry     Mild, bilat (Optometrist, Mayford Knife at [facility] [facility] [employer] [facility] [place] in [place], [place])  . Iron deficiency anemia 2014    03/2012 capsule endoscopy showed 2 AVMs--likely responsible for his IDA--lifetime iron supp recommended + q48moCBCs.  . 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 COPD (chronic obstructive pulmonary disease) ([facility]     GOLD II.  Spirometry  2004 borderline  obstruction; 2015 mod obst  . Hyperlipidemia   . Pericardial effusion with cardiac tamponade 6//29/15    periocardialcentesis was done,  Infectious/inflamm (cytology showed NO MALIGNANT CELLS)  . Other and unspecified angina pectoris   . DOE (dyspnea on exertion)     COPD + chronic diastolic HF  . Pneumonia   . DM type 2 (diabetes mellitus, type 2) (HCC)     Poor control on max oral meds--pt eventually agreed to insulin therap  . CAD, multiple vessel     3V CAD cath 08/08/13----CABG done shortly after.  . Chronic renal insufficiency, stage II (mild)   . Diabetic nephropathy (HCC)     Elevated urine microalb/cr 03/2011  . Hearing loss of both ears 2016    Hearing aids  . Chronic diastolic heart failure ([facility] 2016   Past Surgical History  Procedure Laterality Date  . Cholecystectomy open  1999  . Appendectomy  1957  . Hip surgery Right 1954    Repair of slipped capital femoral epiphysis.  . Patella fracture surgery Left ~ 1979    bolt + 3 screws to repair tib plateau fx  . Testicle surgery  as a child    Undescended testicle brought down into scrotum  . Tonsillectomy  1947  . Colonoscopy  10/16/2011    Procedure: COLONOSCOPY;  Surgeon: RInda Castle MD;  Location: [facility];  Service: Endoscopy;  Laterality: N/A;  . Cataract extraction  w/ intraocular lens  implant, bilateral  04/08/2006 & 04/22/2006  . Esophagogastroduodenoscopy  02/25/12    Atrophic gastritis with a few erosions--capsule endoscopy planned as of 02/25/12 (Dr. Deatra Ina).  . Total hip arthroplasty Right 08/12/2012    Procedure: REMOVAL OF OLD PINS RIGHT HIP AND RIGHT TOTAL HIP ARTHROPLASTY ANTERIOR APPROACH;  Surgeon: Mcarthur Rossetti, MD;  Location: [facility];  Service: Orthopedics;  Laterality: Right;  . Hardware removal Right 08/12/2012    Procedure: HARDWARE REMOVAL;  Surgeon: Mcarthur Rossetti, MD;  Location: [facility];  Service: Orthopedics;  Laterality: Right;  . Cardiac catheterization  08/08/2013  . Cataract  extraction w/ intraocular lens  implant, bilateral Bilateral   . Coronary artery bypass graft N/A 08/14/2013    Procedure: CORONARY ARTERY BYPASS GRAFTING (CABG);  Surgeon: Melrose Nakayama, MD;  Location: [facility];  Service: Open Heart Surgery;  Laterality: N/A;  Times 4   using left internal mammary artery and endoscopically harvested bilateral saphenous vein  . Intraoperative transesophageal echocardiogram N/A 08/14/2013    Normal LV function. Procedure: INTRAOPERATIVE TRANSESOPHAGEAL ECHOCARDIOGRAM;  Surgeon: Melrose Nakayama, MD;  Location: [facility];  Service: Open Heart Surgery;  Laterality: N/A;  . Pericardial tap N/A 07/01/2013    Procedure: PERICARDIAL TAP;  Surgeon: Jettie Booze, MD;  Location: [facility] CATH LAB;  Service: Cardiovascular;  Laterality: N/A;  . Left heart catheterization with coronary angiogram N/A 08/08/2013    Procedure: LEFT HEART CATHETERIZATION WITH CORONARY ANGIOGRAM;  Surgeon: Jettie Booze, MD;  Location: [facility] CATH LAB;  Service: Cardiovascular;  Laterality: N/A;  . Transthoracic echocardiogram  10/30/14    Mod LVH, EF 60-65%, normal wall motion, mod mitral regurg, mild PAH   Family History  Problem Relation Age of Onset  . Breast cancer Sister   . Diabetes Maternal Uncle   . Diabetes Paternal Grandmother   . Heart disease Father   . Heart disease Maternal Uncle   . CVA Mother   . Heart attack Father   . Hypertension Mother   . Stroke Neg Hx    Social History  Substance Use Topics  . Smoking status: Former Smoker -- 1.00 packs/day for 62 years    Types: Cigarettes    Quit date: 10/29/2014  . Smokeless tobacco: Former Systems developer    Quit date: 06/30/2013  . Alcohol Use: No    Review of Systems  Constitutional: Negative for fever, chills and diaphoresis.  HENT:       Right maxillary swelling      Allergies  Morphine and related; Demerol; and Starlix  Home Medications   Prior to Admission medications   Medication Sig Start Date End Date Taking?  Authorizing Provider  acetaminophen (TYLENOL) 325 MG tablet Take 2 tablets (650 mg total) by mouth every 4 (four) hours as needed for headache or mild pain. 08/09/13   Erlene Quan, PA-C  aspirin 81 MG tablet Take 81 mg by mouth at bedtime.     Historical Provider, MD  budesonide-formoterol (SYMBICORT) 160-4.5 MCG/ACT inhaler Inhale 2 puffs into the lungs 2 (two) times daily. 06/08/13   Tammi Sou, MD  buPROPion (ZYBAN) 150 MG 12 hr tablet Take 150 mg by mouth 2 (two) times daily.    Historical Provider, MD  Cholecalciferol (VITAMIN D-3) 1000 UNITS CAPS Take 1,000 Units by mouth daily.     Historical Provider, MD  Chromium Picolinate 1000 MCG TABS Take 1,000 mcg by mouth daily.     Historical Provider, MD  citalopram (CELEXA)  40 MG tablet Take 1 tablet (40 mg total) by mouth daily. 07/23/14   Tammi Sou, MD  diltiazem (CARDIZEM CD) 240 MG 24 hr capsule Take 1 capsule (240 mg total) by mouth daily. 11/27/14   Isaiah Serge, NP  furosemide (LASIX) 20 MG tablet Take 1 tablet (20 mg total) by mouth daily. 11/26/14   Isaiah Serge, NP  glimepiride (AMARYL) 4 MG tablet Take 4 mg by mouth 2 (two) times daily. 11/16/14   Historical Provider, MD  guaiFENesin (MUCINEX) 600 MG 12 hr tablet Take 2 tablets (1,200 mg total) by mouth 2 (two) times daily. 11/02/14   Verlee Monte, MD  Insulin Detemir (LEVEMIR FLEXPEN) 100 UNIT/ML Pen 55 U SQ qhs 11/19/14   Tammi Sou, MD  insulin regular (NOVOLIN R,HUMULIN R) 100 units/mL injection 8 U SQ q AC (take about 20 min prior to eating) Patient taking differently: Inject 8 Units into the skin 3 (three) times daily before meals. 8 U SQ q AC (take about 20 min prior to eating) 07/23/14   Tammi Sou, MD  isosorbide mononitrate (IMDUR) 30 MG 24 hr tablet Take 1 tablet (30 mg total) by mouth daily. 05/24/14   Tammi Sou, MD  lovastatin (MEVACOR) 40 MG tablet Take 1 tablet (40 mg total) by mouth 2 (two) times daily. 05/24/14   Tammi Sou, MD   metFORMIN (GLUCOPHAGE) 1000 MG tablet Take 1 tablet (1,000 mg total) by mouth 2 (two) times daily with a meal. 05/24/14   Tammi Sou, MD  Multiple Vitamin (MULTIVITAMIN WITH MINERALS) TABS Take 1 tablet by mouth daily.    Historical Provider, MD  potassium chloride (K-DUR) 10 MEQ tablet Take 1 tablet (10 mEq total) by mouth daily. 11/27/14   Isaiah Serge, NP  ramipril (ALTACE) 10 MG capsule Take 10 mg by mouth 2 (two) times daily.  09/11/14   Historical Provider, MD  rivaroxaban (XARELTO) 20 MG TABS tablet Take 1 tablet (20 mg total) by mouth daily with supper. 11/12/14   Imogene Burn, PA-C  tamsulosin (FLOMAX) 0.4 MG CAPS capsule TAKE 1 CAPSULE BY MOUTH EVERY DAY AFTER SUPPER 05/24/14   Tammi Sou, MD   BP 142/67 mmHg  Pulse 93  Temp(Src) 98.3 F (36.8 C) (Oral)  Resp 18  Ht 5' 11.5" (1.816 m)  Wt 104.072 kg  BMI 31.56 kg/m2  SpO2 99% Physical Exam  Constitutional: He is oriented to person, place, and time. He appears well-developed and well-nourished. No distress.  HENT:  Head: Normocephalic and atraumatic.  Some minor swelling and redness noted just under the patient's right eye. Right eye appears without abnormalities. Scant dried blood present in the right nare. Airway is patent. No trouble swallowing. Tenderness to the right maxillary area as well as to the right upper buccal mucosa and gingiva. No discoloration noted inside the mouth. Patient is edentulous.  Eyes: Conjunctivae and EOM are normal. Pupils are equal, round, and reactive to light.  No pain with EOMs or with exposure to light.  Neck: Normal range of motion. Neck supple.  Cardiovascular: Normal rate, regular rhythm and normal heart sounds.   Pulmonary/Chest: Effort normal and breath sounds normal. No respiratory distress.  Abdominal: Soft. Bowel sounds are normal.  Musculoskeletal: He exhibits no edema or tenderness.  Lymphadenopathy:    He has no cervical adenopathy.  Neurological: He is alert and  oriented to person, place, and time.  Skin: Skin is warm and dry. He is not  diaphoretic.  Nursing note and vitals reviewed.   ED Course  Procedures (including critical care time) Labs Review Labs Reviewed  CBC WITH DIFFERENTIAL/PLATELET - Abnormal; Notable for the following:    Hemoglobin 11.6 (*)    HCT 38.1 (*)    RDW 17.2 (*)    Monocytes Absolute 1.2 (*)    All other components within normal limits  BASIC METABOLIC PANEL - Abnormal; Notable for the following:    Sodium 133 (*)    Chloride 100 (*)    Glucose, Bld 290 (*)    BUN 30 (*)    Calcium 8.8 (*)    All other components within normal limits  PROTIME-INR - Abnormal; Notable for the following:    Prothrombin Time 27.7 (*)    INR 2.63 (*)    All other components within normal limits  CBG MONITORING, ED - Abnormal; Notable for the following:    Glucose-Capillary 278 (*)    All other components within normal limits    Imaging Review Ct Head W Wo Contrast  11/28/2014  CLINICAL DATA:  Right-sided facial swelling and pain began yesterday. Swelling about the right eye. EXAM: CT HEAD WITHOUT AND WITH CONTRAST CT MAXILLOFACIAL WITH CONTRAST TECHNIQUE: Multidetector CT imaging of the head was performed following the standard protocol before and after bolus injection of intravenous contrast. Multidetector CT imaging of the face were performed following the standard protocol during bolus administration of intravenous contrast. CONTRAST:  47m OMNIPAQUE IOHEXOL 300 MG/ML  SOLN COMPARISON:  None. FINDINGS: CT HEAD FINDINGS A remote lacunar infarct is present at the right globus pallidus. The basal ganglia are otherwise intact. The insular ribbon is normal bilaterally. The cortex is intact. No acute infarct, hemorrhage, or mass lesion is present. The ventricles are of normal size. No significant extra-axial fluid collection is present. The paranasal sinuses and mastoid air cells are clear. Atherosclerotic calcifications are present within  the cavernous internal carotid arteries. Extensive soft tissue CT MAXILLOFACIAL FINDINGS Extensive subcutaneous edema is present over the right side of the face. A polyp or mucous retention cyst is present in the right maxillary sinus. There is mild mucosal thickening in the right maxillary sinus. The paranasal sinuses are otherwise clear. Edematous changes are noted about the right submandibular gland. There is no evidence for duct obstruction. The salivary glands are otherwise within normal limits. Soft tissue swelling extends to the inferior right periorbital soft tissues. There is no retro-orbital stranding or inflammation. No discrete abscess is present. Although the patient is edentulous, the center the inflammation appears to be adjacent to the right side of the maxilla or a subperiosteal fluid collection measures 2.5 x 1.0 cm. No focal tooth root infection is associated. The mandible is intact and located. Degenerative anterolisthesis at C3-4 measures 4.5 mm. IMPRESSION: 1. Extensive edema and soft tissue swelling over the right side of face. 2. The center of the inflammatory process appears to be a low-density fluid collection adjacent to the right-sided the maxilla measuring 2.5 x 1.0 cm on the axillary images. 3. This could be related to right-sided maxillary sinus disease although the sinus disease is minimal. 4. The patient is edentulous without an odontogenic source for the infection. 5. The inflammatory changes extend to the inferior aspect of the right orbit. 6. 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 7. No acute intracranial abnormality. There is no evidence for intracranial extension of inflammatory process. 8. Grade 1 anterolisthesis at C3-4 measures 4.5 mm. Electronically Signed   By: CHarrell Gave  Mattern M.D.   On: 11/28/2014 13:42   Ct Maxillofacial W/cm  11/28/2014  CLINICAL DATA:  Right-sided facial swelling and pain began yesterday. Swelling about the right eye. EXAM: CT HEAD  WITHOUT AND WITH CONTRAST CT MAXILLOFACIAL WITH CONTRAST TECHNIQUE: Multidetector CT imaging of the head was performed following the standard protocol before and after bolus injection of intravenous contrast. Multidetector CT imaging of the face were performed following the standard protocol during bolus administration of intravenous contrast. CONTRAST:  73m OMNIPAQUE IOHEXOL 300 MG/ML  SOLN COMPARISON:  None. FINDINGS: CT HEAD FINDINGS A remote lacunar infarct is present at the right globus pallidus. The basal ganglia are otherwise intact. The insular ribbon is normal bilaterally. The cortex is intact. No acute infarct, hemorrhage, or mass lesion is present. The ventricles are of normal size. No significant extra-axial fluid collection is present. The paranasal sinuses and mastoid air cells are clear. Atherosclerotic calcifications are present within the cavernous internal carotid arteries. Extensive soft tissue CT MAXILLOFACIAL FINDINGS Extensive subcutaneous edema is present over the right side of the face. A polyp or mucous retention cyst is present in the right maxillary sinus. There is mild mucosal thickening in the right maxillary sinus. The paranasal sinuses are otherwise clear. Edematous changes are noted about the right submandibular gland. There is no evidence for duct obstruction. The salivary glands are otherwise within normal limits. Soft tissue swelling extends to the inferior right periorbital soft tissues. There is no retro-orbital stranding or inflammation. No discrete abscess is present. Although the patient is edentulous, the center the inflammation appears to be adjacent to the right side of the maxilla or a subperiosteal fluid collection measures 2.5 x 1.0 cm. No focal tooth root infection is associated. The mandible is intact and located. Degenerative anterolisthesis at C3-4 measures 4.5 mm. IMPRESSION: 1. Extensive edema and soft tissue swelling over the right side of face. 2. The center of  the inflammatory process appears to be a low-density fluid collection adjacent to the right-sided the maxilla measuring 2.5 x 1.0 cm on the axillary images. 3. This could be related to right-sided maxillary sinus disease although the sinus disease is minimal. 4. The patient is edentulous without an odontogenic source for the infection. 5. The inflammatory changes extend to the inferior aspect of the right orbit. 6. 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 7. No acute intracranial abnormality. There is no evidence for intracranial extension of inflammatory process. 8. Grade 1 anterolisthesis at C3-4 measures 4.5 mm. Electronically Signed   By: CSan MorelleM.D.   On: 11/28/2014 13:42   I have personally reviewed and evaluated these images and lab results as part of my medical decision-making.   EKG Interpretation None      MDM   Final diagnoses:  Right facial swelling  Cellulitis of face    JTAMER BAUGHMANpresents with right maxillary swelling, redness, and pain since last night.  Findings and plan of care discussed with Courteney Lyn Mackuen, MD.  Patient has increased chance of infection due to his diabetes. Need to rule out sinus abscess. Patient is afebrile, not tachycardic, and nontoxic appearing. CBC shows no leukocytosis and the anemia shown is chronic and stable. Progression of swelling while here in the ED. Pt also now has an subconjunctival hemorrhage. Pictures taken with patient's verbal permission and added to the record. Dr. MThomasene Lotto handle ENT consult. [facility]. Patient stable upon transfer.   Filed Vitals:  11/28/14 1152 11/28/14 1454  BP: 135/56 142/67  Pulse: 91 93  Temp: 98.3 F (36.8 C)   TempSrc: Oral   Resp: 18 18  Height: 5' 11.5" (1.816 m)   Weight: 104.072 kg   SpO2: 99% 99%      Lorayne Bender, PA-C 11/28/14 [place], MD 11/29/14 0940

## 2014-11-28 NOTE — Progress Notes (Signed)
Patient arrived via ambulance from [facility] ED.  Report received from ED RN.  IV antibiotics started at ED and continued during transport.  Dr. Verlon Au and Dr. Benjamine Mola, ENT, have spoken to and assessed patient. Patient placed on diet and IV antibiotics continued.

## 2014-11-28 NOTE — Progress Notes (Signed)
Subjective:    Patient ID: Joshua [name], male    DOB: 1940-10-11, 74 y.o.   MRN: [id number]  HPI  Right facial swelling: Patient states that last night he noticed some right facial swelling and pain under his right eye. He states that he woke up this morning his right face was more swollen, under his eye was swollen in the right side of his face was painful. He states the pain is a throbbing pain and he points to his right maxillary sinus and just below his right nostril. He states that over the last few weeks he has noticed some bloody drainage coming from his right nostril. He denies fevers, chills, nausea, vomiting, diarrhea or ear pain. He denies any visual changes. He denies any difficulty breathing or swallowing. Patient states nothing like this has occurred to him before. His appetite is still good. He reports he took 2 Tylenol approximately 3 hours ago to try to ease off the pain, but it is still hurting. He states after Tylenol his pain is approximately 4/10. Patient is a diabetic, with history of diastolic heart failure and CABG 1 year ago. He was recently admitted for COPD exacerbation/CHF.  Past Medical History  Diagnosis Date  . Hypertension   . Tobacco dependence in remission     100+ pack-yr hx: quit after CABG  . Obesity   . Hyperplastic colon polyp 2001  . Erectile dysfunction     Normal testosterone  . Adenomatous colon polyp 10/16/2011    Repeat 2018  . 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 pylori neg, no dysplasia or malignancy.  . Macular degeneration, dry     Mild, bilat (Optometrist, Mayford Knife at [employer] [facility] [place] in [place], [place])  . Iron deficiency anemia 2014    03/2012 capsule endoscopy showed 2 AVMs--likely responsible for his IDA--lifetime iron supp recommended + q4moCBCs.  . 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 COPD (chronic obstructive pulmonary disease) ([facility]     GOLD II.  Spirometry  2004 borderline  obstruction; 2015 mod obst  . Hyperlipidemia   . Pericardial effusion with cardiac tamponade 6//29/15    periocardialcentesis was done,  Infectious/inflamm (cytology showed NO MALIGNANT CELLS)  . Other and unspecified angina pectoris   . DOE (dyspnea on exertion)     COPD + chronic diastolic HF  . Pneumonia   . DM type 2 (diabetes mellitus, type 2) (HCC)     Poor control on max oral meds--pt eventually agreed to insulin therap  . CAD, multiple vessel     3V CAD cath 08/08/13----CABG done shortly after.  . Chronic renal insufficiency, stage II (mild)   . Diabetic nephropathy (HCC)     Elevated urine microalb/cr 03/2011  . Hearing loss of both ears 2016    Hearing aids  . Chronic diastolic heart failure ([facility] 2016   Allergies  Allergen Reactions  . Morphine And Related Other (See Comments)    Drenched with perspiration  . Demerol [Meperidine] Nausea Only  . Starlix [Nateglinide] Other (See Comments)    gassy    Review of Systems Negative, with the exception of above mentioned in HPI     Objective:   Physical Exam BP 107/53 mmHg  Pulse 89  Temp(Src) 98 F (36.7 C)  Resp 20  Wt 229 lb 8 oz (104.101 kg)  SpO2 93%  Body mass index is 31.57 kg/(m^2). Gen: Afebrile. No acute distress.  Well-developed, well-nourished, obese Caucasian male. Very pleasant. HENT: Moderate Right facial swelling and mild redness from under patient's right eye to mandible. Erythema right buccal mucosa. No obvious signs of infection of the mouth. No drainage, no abscess formation.MMM. No oral lesions Edentulous. Bilateral TM visualized and normal in appearance. . Bilateral nares with erythema, excoriations, dried blood right nare, white plaque-like formation right nare, mild swelling. Throat without erythema or exudates. No difficulty with swallowing. Taunt/fullness over right maxillary sinus. Tenderness to palpation right maxillary sinus.  Eyes:Pupils Equal Round Reactive to light, Extraocular movements  intact without pain,  Conjunctiva without redness, discharge or icterus. Neck/lymp/endocrine: Supple, mild submandibular lymphadenopathy CV: RRR  Neuro:  Alert. Oriented x3     Assessment & Plan:  Facial cellulitis/?  Abscess of right nasal cavity - Given patient's extent of right facial swelling involvement of the periorbital space, comorbidity conditions including diabetes, and visualization rt nostril with redness and pus like/wht ? abscess formation. - Patient was sent to [facility] for urgent workup with blood work and imaging studies. Discussed with patient that after imaging the may decide to send him home on oral antibiotics, but there is a likelihood that he will be admitted and then transferred to [facility]. Patient is aware if he is admitted he will need to be transferred to [facility].

## 2014-11-28 NOTE — ED Notes (Signed)
Patient transported to CT 

## 2014-11-28 NOTE — ED Notes (Signed)
Reports came from PCP and was told his sinus cavity is so blocked on right sided they want a CT to rule out sinus abscess

## 2014-11-28 NOTE — ED Notes (Signed)
I took CBG and got result of 278 mg./dcltr.

## 2014-11-28 NOTE — Patient Instructions (Signed)
Go to [facility]  for facial abscess and CT scan for concern of right nasal/sinus abscess formation.   Abscess An abscess is an infected area that contains a collection of pus and debris.It can occur in almost any part of the body. An abscess is also known as a furuncle or boil. CAUSES  An abscess occurs when tissue gets infected. This can occur from blockage of oil or sweat glands, infection of hair follicles, or a minor injury to the skin. As the body tries to fight the infection, pus collects in the area and creates pressure under the skin. This pressure causes pain. People with weakened immune systems have difficulty fighting infections and get certain abscesses more often.  SYMPTOMS Usually an abscess develops on the skin and becomes a painful mass that is red, warm, and tender. If the abscess forms under the skin, you may feel a moveable soft area under the skin. Some abscesses break open (rupture) on their own, but most will continue to get worse without care. The infection can spread deeper into the body and eventually into the bloodstream, causing you to feel ill.  DIAGNOSIS  Your caregiver will take your medical history and perform a physical exam. A sample of fluid may also be taken from the abscess to determine what is causing your infection. TREATMENT  Your caregiver may prescribe antibiotic medicines to fight the infection. However, taking antibiotics alone usually does not cure an abscess. Your caregiver may need to make a small cut (incision) in the abscess to drain the pus. In some cases, gauze is packed into the abscess to reduce pain and to continue draining the area. HOME CARE INSTRUCTIONS   Only take over-the-counter or prescription medicines for pain, discomfort, or fever as directed by your caregiver.  If you were prescribed antibiotics, take them as directed. Finish them even if you start to feel better.  If gauze is used, follow your caregiver's directions for changing  the gauze.  To avoid spreading the infection:  Keep your draining abscess covered with a bandage.  Wash your hands well.  Do not share personal care items, towels, or whirlpools with others.  Avoid skin contact with others.  Keep your skin and clothes clean around the abscess.  Keep all follow-up appointments as directed by your caregiver. SEEK MEDICAL CARE IF:   You have increased pain, swelling, redness, fluid drainage, or bleeding.  You have muscle aches, chills, or a general ill feeling.  You have a fever. MAKE SURE YOU:   Understand these instructions.  Will watch your condition.  Will get help right away if you are not doing well or get worse.   This information is not intended to replace advice given to you by your health care provider. Make sure you discuss any questions you have with your health care provider.   Document Released: 10/01/2004 Document Revised: 06/23/2011 Document Reviewed: 03/06/2011 Elsevier Interactive Patient Education 2016 Elsevier Inc.  Cellulitis Cellulitis is an infection of the skin and the tissue beneath it. The infected area is usually red and tender. Cellulitis occurs most often in the arms and lower legs.  CAUSES  Cellulitis is caused by bacteria that enter the skin through cracks or cuts in the skin. The most common types of bacteria that cause cellulitis are staphylococci and streptococci. SIGNS AND SYMPTOMS   Redness and warmth.  Swelling.  Tenderness or pain.  Fever. DIAGNOSIS  Your health care provider can usually determine what is wrong based on a  physical exam. Blood tests may also be done. TREATMENT  Treatment usually involves taking an antibiotic medicine. HOME CARE INSTRUCTIONS   Take your antibiotic medicine as directed by your health care provider. Finish the antibiotic even if you start to feel better.  Keep the infected arm or leg elevated to reduce swelling.  Apply a warm cloth to the affected area up to 4  times per day to relieve pain.  Take medicines only as directed by your health care provider.  Keep all follow-up visits as directed by your health care provider. SEEK MEDICAL CARE IF:   You notice red streaks coming from the infected area.  Your red area gets larger or turns dark in color.  Your bone or joint underneath the infected area becomes painful after the skin has healed.  Your infection returns in the same area or another area.  You notice a swollen bump in the infected area.  You develop new symptoms.  You have a fever. SEEK IMMEDIATE MEDICAL CARE IF:   You feel very sleepy.  You develop vomiting or diarrhea.  You have a general ill feeling (malaise) with muscle aches and pains.   This information is not intended to replace advice given to you by your health care provider. Make sure you discuss any questions you have with your health care provider.   Document Released: 10/01/2004 Document Revised: 09/12/2014 Document Reviewed: 03/09/2011 Elsevier Interactive Patient Education [employer].

## 2014-11-28 NOTE — Progress Notes (Signed)
74 year old male with a history that is mellitus, CAD with history of CABG, hyperlipidemia, COPD presented to the emergency department with onset of right-sided facial edema and erythema that started around 6 PM on 11/27/2014. CT of the brain was negative for any acute findings. CT of the face revealed edema of the right side of the face with fluid collection adjacent to the right maxilla. ER physician spoke with ENT, Dr. Benjamine Mola whom will follow but did not feel pt needed emergent surgical intervention.  Patient was started on vancomycin and Zosyn. Admission was requested. The patient was accepted for MedSurg bed.  DTat

## 2014-11-28 NOTE — Patient Outreach (Signed)
Received phone call from pt's daughter, Hilda Blades, this morning, reporting her father had gone to the MD and was being sent to the ED for sinus swelling. She gave me the details that this came up suddenly and he acted appropriately by going to the MD first. I agreed with the MD evaluation and need for pt to be seen in the ED according to her report of pt symptoms. I assured her I would be following up on Friday, the day after Thanksgiving.  Deloria Lair [facility] [place] [phone]

## 2014-11-28 NOTE — Progress Notes (Addendum)
[facility] History and Physical  Joshua [name] ZDG:[id number] DOB: [birth year] DOA: 11/28/2014  Referring physician: [facility] ED PCP: Tammi Sou, MD  Specialists: ENT Dr. Benjamine Mola  74 y/o ? Recent admission acute rep failure multifactorial Heavy smoker 3pcl/day x 40 yrs-GOLD classII-->quit after last hospitalization asbestos exposure   DM ty II Diastolic HF CABG x4 9518 c   Pericardial effusion s/p Pericardiocentesis 06/2013 PAF Chad2Vasc2~ 5 Iron def anemia Mac degen  Doing fair until yesterday evening noted to have swelling of the right side of the jaw and face, progressed rapidly and went to primary care physician's office. No fever no chills no nausea no vomiting No upper respiratory symptoms No air pain No throat pain No sick contacts  No lower extremity swelling No chest pain No blurred vision or double vision   States he's been feeling fine and this came up "all of a sudden" He has no feeling of throat tightness or difficulty breathing or wheezing or hoarseness Quit smoking about 1 month ago-wears dentures  Family history of diabetes and hypertension  Allergic to Starlix, morphine  Used to be a Administrator retired recently Quit drinking a while back Once again quit smoking recently   Past Medical History  Diagnosis Date  . Hypertension   . Tobacco dependence in remission     100+ pack-yr hx: quit after CABG  . Obesity   . Hyperplastic colon polyp 2001  . Erectile dysfunction     Normal testosterone  . Adenomatous colon polyp 10/16/2011    Repeat 2018  . 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 pylori neg, no dysplasia or malignancy.  . Macular degeneration, dry     Mild, bilat (Optometrist, Mayford Knife at [facility] in [place], [place])  . Iron deficiency anemia 2014    03/2012 capsule endoscopy showed 2 AVMs--likely responsible for his IDA--lifetime iron supp recommended + q60moCBCs.  .  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 COPD (chronic obstructive pulmonary disease) ([facility]     GOLD II.  Spirometry  2004 borderline obstruction; 2015 mod obst  . Hyperlipidemia   . Pericardial effusion with cardiac tamponade 6//29/15    periocardialcentesis was done,  Infectious/inflamm (cytology showed NO MALIGNANT CELLS)  . Other and unspecified angina pectoris   . DOE (dyspnea on exertion)     COPD + chronic diastolic HF  . Pneumonia   . DM type 2 (diabetes mellitus, type 2) (HCC)     Poor control on max oral meds--pt eventually agreed to insulin therap  . CAD, multiple vessel     3V CAD cath 08/08/13----CABG done shortly after.  . Chronic renal insufficiency, stage II (mild)   . Diabetic nephropathy (HCC)     Elevated urine microalb/cr 03/2011  . Hearing loss of both ears 2016    Hearing aids  . Chronic diastolic heart failure ([facility] 2016   Past Surgical History  Procedure Laterality Date  . Cholecystectomy open  1999  . Appendectomy  1957  . Hip surgery Right 1954    Repair of slipped capital femoral epiphysis.  . Patella fracture surgery Left ~ 1979    bolt + 3 screws to repair tib plateau fx  . Testicle surgery  as a child    Undescended testicle brought down into scrotum  . Tonsillectomy  1947  . Colonoscopy  10/16/2011    Procedure: COLONOSCOPY;  Surgeon: RInda Castle MD;  Location:  [facility];  Service: Endoscopy;  Laterality: N/A;  . Cataract extraction w/ intraocular lens  implant, bilateral  04/08/2006 & 04/22/2006  . Esophagogastroduodenoscopy  02/25/12    Atrophic gastritis with a few erosions--capsule endoscopy planned as of 02/25/12 (Dr. Deatra Ina).  . Total hip arthroplasty Right 08/12/2012    Procedure: REMOVAL OF OLD PINS RIGHT HIP AND RIGHT TOTAL HIP ARTHROPLASTY ANTERIOR APPROACH;  Surgeon: Mcarthur Rossetti, MD;  Location: [facility];  Service: Orthopedics;  Laterality: Right;  . Hardware removal Right 08/12/2012    Procedure: HARDWARE REMOVAL;  Surgeon:  Mcarthur Rossetti, MD;  Location: [facility] ORS;  Service: Orthopedics;  Laterality: Right;  . Cardiac catheterization  08/08/2013  . Cataract extraction w/ intraocular lens  implant, bilateral Bilateral   . Coronary artery bypass graft N/A 08/14/2013    Procedure: CORONARY ARTERY BYPASS GRAFTING (CABG);  Surgeon: Melrose Nakayama, MD;  Location: [place];  Service: Open Heart Surgery;  Laterality: N/A;  Times 4   using left internal mammary artery and endoscopically harvested bilateral saphenous vein  . Intraoperative transesophageal echocardiogram N/A 08/14/2013    Normal LV function. Procedure: INTRAOPERATIVE TRANSESOPHAGEAL ECHOCARDIOGRAM;  Surgeon: Melrose Nakayama, MD;  Location: [place];  Service: Open Heart Surgery;  Laterality: N/A;  . Pericardial tap N/A 07/01/2013    Procedure: PERICARDIAL TAP;  Surgeon: Jettie Booze, MD;  Location: [facility] CATH LAB;  Service: Cardiovascular;  Laterality: N/A;  . Left heart catheterization with coronary angiogram N/A 08/08/2013    Procedure: LEFT HEART CATHETERIZATION WITH CORONARY ANGIOGRAM;  Surgeon: Jettie Booze, MD;  Location: [facility] CATH LAB;  Service: Cardiovascular;  Laterality: N/A;  . Transthoracic echocardiogram  10/30/14    Mod LVH, EF 60-65%, normal wall motion, mod mitral regurg, mild PAH   Social History:  Social History   Social History Narrative   Married, 4 children.   Formerly a Medical illustrator.   Local transportation bus driver.  Level of education: HS.     +tobacco--lifelong (still smoking as of 08/2011).  No alcohol or drugs.   No exercise.  +excessive caffeine.    Allergies  Allergen Reactions  . Morphine And Related Other (See Comments)    Drenched with perspiration  . Demerol [Meperidine] Nausea Only  . Starlix [Nateglinide] Other (See Comments)    gassy    Family History  Problem Relation Age of Onset  . Breast cancer Sister   . Diabetes Maternal Uncle   . Diabetes Paternal Grandmother   . Heart  disease Father   . Heart disease Maternal Uncle   . CVA Mother   . Heart attack Father   . Hypertension Mother   . Stroke Neg Hx      Prior to Admission medications   Medication Sig Start Date End Date Taking? Authorizing Provider  acetaminophen (TYLENOL) 325 MG tablet Take 2 tablets (650 mg total) by mouth every 4 (four) hours as needed for headache or mild pain. 08/09/13   Erlene Quan, PA-C  aspirin 81 MG tablet Take 81 mg by mouth at bedtime.     Historical Provider, MD  budesonide-formoterol (SYMBICORT) 160-4.5 MCG/ACT inhaler Inhale 2 puffs into the lungs 2 (two) times daily. 06/08/13   Tammi Sou, MD  buPROPion (ZYBAN) 150 MG 12 hr tablet Take 150 mg by mouth 2 (two) times daily.    Historical Provider, MD  Cholecalciferol (VITAMIN D-3) 1000 UNITS CAPS Take 1,000 Units by mouth daily.     Historical Provider, MD  Chromium Picolinate 1000 MCG TABS Take 1,000 mcg by mouth daily.     Historical Provider, MD  citalopram (CELEXA) 40 MG tablet Take 1 tablet (40 mg total) by mouth daily. 07/23/14   Tammi Sou, MD  diltiazem (CARDIZEM CD) 240 MG 24 hr capsule Take 1 capsule (240 mg total) by mouth daily. 11/27/14   Isaiah Serge, NP  furosemide (LASIX) 20 MG tablet Take 1 tablet (20 mg total) by mouth daily. 11/26/14   Isaiah Serge, NP  glimepiride (AMARYL) 4 MG tablet Take 4 mg by mouth 2 (two) times daily. 11/16/14   Historical Provider, MD  guaiFENesin (MUCINEX) 600 MG 12 hr tablet Take 2 tablets (1,200 mg total) by mouth 2 (two) times daily. 11/02/14   Verlee Monte, MD  Insulin Detemir (LEVEMIR FLEXPEN) 100 UNIT/ML Pen 55 U SQ qhs 11/19/14   Tammi Sou, MD  insulin regular (NOVOLIN R,HUMULIN R) 100 units/mL injection 8 U SQ q AC (take about 20 min prior to eating) Patient taking differently: Inject 8 Units into the skin 3 (three) times daily before meals. 8 U SQ q AC (take about 20 min prior to eating) 07/23/14   Tammi Sou, MD  isosorbide mononitrate (IMDUR) 30 MG 24  hr tablet Take 1 tablet (30 mg total) by mouth daily. 05/24/14   Tammi Sou, MD  lovastatin (MEVACOR) 40 MG tablet Take 1 tablet (40 mg total) by mouth 2 (two) times daily. 05/24/14   Tammi Sou, MD  metFORMIN (GLUCOPHAGE) 1000 MG tablet Take 1 tablet (1,000 mg total) by mouth 2 (two) times daily with a meal. 05/24/14   Tammi Sou, MD  Multiple Vitamin (MULTIVITAMIN WITH MINERALS) TABS Take 1 tablet by mouth daily.    Historical Provider, MD  potassium chloride (K-DUR) 10 MEQ tablet Take 1 tablet (10 mEq total) by mouth daily. 11/27/14   Isaiah Serge, NP  ramipril (ALTACE) 10 MG capsule Take 10 mg by mouth 2 (two) times daily.  09/11/14   Historical Provider, MD  rivaroxaban (XARELTO) 20 MG TABS tablet Take 1 tablet (20 mg total) by mouth daily with supper. 11/12/14   Imogene Burn, PA-C  tamsulosin (FLOMAX) 0.4 MG CAPS capsule TAKE 1 CAPSULE BY MOUTH EVERY DAY AFTER SUPPER 05/24/14   Tammi Sou, MD   Physical Exam: Filed Vitals:   11/28/14 1152 11/28/14 1454 11/28/14 1500  BP: 135/56 142/67 146/56  Pulse: 91 93 102  Temp: 98.3 F (36.8 C)  99.6 F (37.6 C)  TempSrc: Oral  Oral  Resp: _0 Height: 5' 11.5" (1.816 m)    Weight: 104.072 kg (229 lb 7 oz)    SpO2: 99% 99% 96%    On exam alert pleasant oriented no distress Bloodshot right eye obvious swelling to right side of face and pain over the maxilla Inside of the cheek on the right side shows area of denudation and ulceration along the tooth line as well as boggy tenderness in the cheek Chest is clinically clear S1-S2 no murmur rub or gallop Abdomen is obese nontender nondistended Range of motion intact bilaterally otherwise   Labs on Admission:  Basic Metabolic Panel:  Recent Labs Lab 11/26/14 1145 11/28/14 1235  NA 138 133*  K 5.3 4.5  CL 104 100*  CO2 26 24  GLUCOSE 150* 290*  BUN 24 30*  CREATININE 1.03 1.09  CALCIUM 9.2 8.8*   Liver Function Tests: No results for input(s): AST, ALT,  ALKPHOS, BILITOT, PROT, ALBUMIN in the last 168 hours. No results for input(s): LIPASE, AMYLASE in the last 168 hours. No results for input(s): AMMONIA in the last 168 hours. CBC:  Recent Labs Lab 11/26/14 1145 11/28/14 1235  WBC 8.4 8.5  NEUTROABS  --  6.4  HGB 12.1* 11.6*  HCT 39.1 38.1*  MCV 82.8 85.2  PLT 170 162   Cardiac Enzymes: No results for input(s): CKTOTAL, CKMB, CKMBINDEX, TROPONINI in the last 168 hours.  BNP (last 3 results)  Recent Labs  10/29/14 1215  BNP 402.0*    ProBNP (last 3 results) No results for input(s): PROBNP in the last 8760 hours.  CBG:  Recent Labs Lab 11/28/14 1504  GLUCAP 278*    Radiological Exams on Admission: Ct Head W Wo Contrast  11/28/2014  CLINICAL DATA:  Right-sided facial swelling and pain began yesterday. Swelling about the right eye. EXAM: CT HEAD WITHOUT AND WITH CONTRAST CT MAXILLOFACIAL WITH CONTRAST TECHNIQUE: Multidetector CT imaging of the head was performed following the standard protocol before and after bolus injection of intravenous contrast. Multidetector CT imaging of the face were performed following the standard protocol during bolus administration of intravenous contrast. CONTRAST:  74m OMNIPAQUE IOHEXOL 300 MG/ML  SOLN COMPARISON:  None. FINDINGS: CT HEAD FINDINGS A remote lacunar infarct is present at the right globus pallidus. The basal ganglia are otherwise intact. The insular ribbon is normal bilaterally. The cortex is intact. No acute infarct, hemorrhage, or mass lesion is present. The ventricles are of normal size. No significant extra-axial fluid collection is present. The paranasal sinuses and mastoid air cells are clear. Atherosclerotic calcifications are present within the cavernous internal carotid arteries. Extensive soft tissue CT MAXILLOFACIAL FINDINGS Extensive subcutaneous edema is present over the right side of the face. A polyp or mucous retention cyst is present in the right maxillary sinus. There  is mild mucosal thickening in the right maxillary sinus. The paranasal sinuses are otherwise clear. Edematous changes are noted about the right submandibular gland. There is no evidence for duct obstruction. The salivary glands are otherwise within normal limits. Soft tissue swelling extends to the inferior right periorbital soft tissues. There is no retro-orbital stranding or inflammation. No discrete abscess is present. Although the patient is edentulous, the center the inflammation appears to be adjacent to the right side of the maxilla or a subperiosteal fluid collection measures 2.5 x 1.0 cm. No focal tooth root infection is associated. The mandible is intact and located. Degenerative anterolisthesis at C3-4 measures 4.5 mm. IMPRESSION: 1. Extensive edema and soft tissue swelling over the right side of face. 2. The center of the inflammatory process appears to be a low-density fluid collection adjacent to the right-sided the maxilla measuring 2.5 x 1.0 cm on the axillary images. 3. This could be related to right-sided maxillary sinus disease although the sinus disease is minimal. 4. The patient is edentulous without an odontogenic source for the infection. 5. The inflammatory changes extend to the inferior aspect of the right orbit. 6. 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 7. No acute intracranial abnormality. There is no evidence for intracranial extension of inflammatory process. 8. Grade 1 anterolisthesis at C3-4 measures 4.5 mm. Electronically Signed   By: CSan MorelleM.D.   On: 11/28/2014 13:42   Ct Maxillofacial W/cm  11/28/2014  CLINICAL DATA:  Right-sided facial swelling and pain began yesterday. Swelling about the right eye. EXAM: CT HEAD WITHOUT AND WITH CONTRAST CT MAXILLOFACIAL WITH CONTRAST TECHNIQUE: Multidetector  CT imaging of the head was performed following the standard protocol before and after bolus injection of intravenous contrast. Multidetector CT imaging of the  face were performed following the standard protocol during bolus administration of intravenous contrast. CONTRAST:  39m OMNIPAQUE IOHEXOL 300 MG/ML  SOLN COMPARISON:  None. FINDINGS: CT HEAD FINDINGS A remote lacunar infarct is present at the right globus pallidus. The basal ganglia are otherwise intact. The insular ribbon is normal bilaterally. The cortex is intact. No acute infarct, hemorrhage, or mass lesion is present. The ventricles are of normal size. No significant extra-axial fluid collection is present. The paranasal sinuses and mastoid air cells are clear. Atherosclerotic calcifications are present within the cavernous internal carotid arteries. Extensive soft tissue CT MAXILLOFACIAL FINDINGS Extensive subcutaneous edema is present over the right side of the face. A polyp or mucous retention cyst is present in the right maxillary sinus. There is mild mucosal thickening in the right maxillary sinus. The paranasal sinuses are otherwise clear. Edematous changes are noted about the right submandibular gland. There is no evidence for duct obstruction. The salivary glands are otherwise within normal limits. Soft tissue swelling extends to the inferior right periorbital soft tissues. There is no retro-orbital stranding or inflammation. No discrete abscess is present. Although the patient is edentulous, the center the inflammation appears to be adjacent to the right side of the maxilla or a subperiosteal fluid collection measures 2.5 x 1.0 cm. No focal tooth root infection is associated. The mandible is intact and located. Degenerative anterolisthesis at C3-4 measures 4.5 mm. IMPRESSION: 1. Extensive edema and soft tissue swelling over the right side of face. 2. The center of the inflammatory process appears to be a low-density fluid collection adjacent to the right-sided the maxilla measuring 2.5 x 1.0 cm on the axillary images. 3. This could be related to right-sided maxillary sinus disease although the sinus  disease is minimal. 4. The patient is edentulous without an odontogenic source for the infection. 5. The inflammatory changes extend to the inferior aspect of the right orbit. 6. 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 7. No acute intracranial abnormality. There is no evidence for intracranial extension of inflammatory process. 8. Grade 1 anterolisthesis at C3-4 measures 4.5 mm. Electronically Signed   By: CSan MorelleM.D.   On: 11/28/2014 13:42    EKG: Independently reviewed. none  Assessment/Plan Active Problems:   Facial cellulitis   Cellulitis and abscess of neck  Facial cellulitis with potential abscess-we'll start Zosyn 3.3 75 every 8 hourly which should cover all anaerobic bacteria. Vancomycin as per ENT.  consult is pending keep nothing by mouth for now  Diabetes mellitus type 2 place on every 4 coverage  And keep nothing by mouth for now place on IV saline 75 cc per hour. Hold Amaryl 4 mg daily. I will have the dose of his 55 units of Levemir to 22 units. We will also hold his mealtime insulin as well as metformin 1000 twice a day  Atrial fibrillation CMaliscore 6-continue Cardizem 240 CD hold Xarelto 20 mg daily at bedtime  Depression continue bupropion 150 twice a day, citalopram 40 daily  History CAD 2015 30 daily Continue Imdur, continue ramipril 10 twice a day. He may continue aspirin 81 daily  COPD Gold stage II-continue Symbicort 2 puffs twice a day and deficiency anemia-stable currently  Macular degeneration-stable   Time spent: 4[place] Pager [phone] If 7PM-7AM, please contact night-coverage [email or web address] Password [email or web address]11/23/2016,  6:35 PM

## 2014-11-29 LAB — GLUCOSE, RANDOM: Glucose, Bld: 549 mg/dL — ABNORMAL HIGH (ref 65–99)

## 2014-11-29 LAB — COMPREHENSIVE METABOLIC PANEL
ALBUMIN: 2.9 g/dL — AB (ref 3.5–5.0)
ALK PHOS: 62 U/L (ref 38–126)
ALT: 19 U/L (ref 17–63)
AST: 15 U/L (ref 15–41)
Anion gap: 8 (ref 5–15)
BILIRUBIN TOTAL: 0.6 mg/dL (ref 0.3–1.2)
BUN: 19 mg/dL (ref 6–20)
CALCIUM: 8.5 mg/dL — AB (ref 8.9–10.3)
CO2: 23 mmol/L (ref 22–32)
CREATININE: 1.09 mg/dL (ref 0.61–1.24)
Chloride: 104 mmol/L (ref 101–111)
GFR calc Af Amer: >60 mL/min (ref >60)
GLUCOSE: 285 mg/dL — AB (ref 65–99)
POTASSIUM: 4.1 mmol/L (ref 3.5–5.1)
Sodium: 135 mmol/L (ref 135–145)
TOTAL PROTEIN: 6 g/dL — AB (ref 6.5–8.1)

## 2014-11-29 LAB — CBC
HEMATOCRIT: 35.3 % — AB (ref 39.0–52.0)
Hemoglobin: 11.4 g/dL — ABNORMAL LOW (ref 13.0–17.0)
MCH: 27 pg (ref 26.0–34.0)
MCHC: 32.3 g/dL (ref 30.0–36.0)
MCV: 83.5 fL (ref 78.0–100.0)
PLATELETS: 138 10*3/uL — AB (ref 150–400)
RBC: 4.23 MIL/uL (ref 4.22–5.81)
RDW: 16.9 % — AB (ref 11.5–15.5)
WBC: 9.5 10*3/uL (ref 4.0–10.5)

## 2014-11-29 LAB — GLUCOSE, CAPILLARY
GLUCOSE-CAPILLARY: 514 mg/dL — AB (ref 65–99)
GLUCOSE-CAPILLARY: 521 mg/dL — AB (ref 65–99)
GLUCOSE-CAPILLARY: 352 mg/dL — AB (ref 65–99)
Glucose-Capillary: 185 mg/dL — ABNORMAL HIGH (ref 65–99)
Glucose-Capillary: 282 mg/dL — ABNORMAL HIGH (ref 65–99)
Glucose-Capillary: 462 mg/dL — ABNORMAL HIGH (ref 65–99)

## 2014-11-29 LAB — HEPARIN LEVEL (UNFRACTIONATED): Heparin Unfractionated: 0.83 [IU]/mL — ABNORMAL HIGH (ref 0.30–0.70)

## 2014-11-29 LAB — APTT: aPTT: 66 seconds — ABNORMAL HIGH (ref 24–37)

## 2014-11-29 LAB — PROTIME-INR
INR: 1.69 — AB (ref 0.00–1.49)
Prothrombin Time: 19.9 seconds — ABNORMAL HIGH (ref 11.6–15.2)

## 2014-11-29 MED ORDER — DEXAMETHASONE SODIUM PHOSPHATE 10 MG/ML IJ SOLN
10.0000 mg | Freq: Once | INTRAMUSCULAR | Status: AC
  Administered 2014-11-29: 10 mg via INTRAVENOUS
  Filled 2014-11-29: qty 1

## 2014-11-29 MED ORDER — INSULIN ASPART 100 UNIT/ML ~~LOC~~ SOLN
0.0000 [IU] | Freq: Three times a day (TID) | SUBCUTANEOUS | Status: DC
  Administered 2014-11-30: 5 [IU] via SUBCUTANEOUS
  Administered 2014-11-30 (×2): 8 [IU] via SUBCUTANEOUS

## 2014-11-29 MED ORDER — INSULIN ASPART 100 UNIT/ML ~~LOC~~ SOLN
6.0000 [IU] | Freq: Three times a day (TID) | SUBCUTANEOUS | Status: DC
Start: 2014-11-29 — End: 2014-12-01
  Administered 2014-11-29 – 2014-11-30 (×4): 6 [IU] via SUBCUTANEOUS

## 2014-11-29 MED ORDER — INSULIN ASPART 100 UNIT/ML ~~LOC~~ SOLN: 0.0000 [IU] | Freq: Three times a day (TID) | SUBCUTANEOUS | Status: DC

## 2014-11-29 MED ORDER — HEPARIN BOLUS VIA INFUSION
4000.0000 [IU] | Freq: Once | INTRAVENOUS | Status: AC
  Administered 2014-11-29: 4000 [IU] via INTRAVENOUS
  Filled 2014-11-29: qty 4000

## 2014-11-29 MED ORDER — INSULIN DETEMIR 100 UNIT/ML ~~LOC~~ SOLN
60.0000 [IU] | Freq: Every day | SUBCUTANEOUS | Status: DC
  Administered 2014-11-29 – 2014-11-30 (×2): 60 [IU] via SUBCUTANEOUS
  Filled 2014-11-29 (×3): qty 0.6

## 2014-11-29 MED ORDER — INSULIN ASPART 100 UNIT/ML ~~LOC~~ SOLN
0.0000 [IU] | Freq: Every day | SUBCUTANEOUS | Status: DC
  Administered 2014-11-29: 3 [IU] via SUBCUTANEOUS
  Administered 2014-11-30: 2 [IU] via SUBCUTANEOUS

## 2014-11-29 MED ORDER — INSULIN ASPART 100 UNIT/ML ~~LOC~~ SOLN
8.0000 [IU] | Freq: Once | SUBCUTANEOUS | Status: AC
  Administered 2014-11-29: 8 [IU] via SUBCUTANEOUS

## 2014-11-29 MED ORDER — INSULIN ASPART 100 UNIT/ML ~~LOC~~ SOLN
20.0000 [IU] | Freq: Once | SUBCUTANEOUS | Status: AC
  Administered 2014-11-29: 20 [IU] via SUBCUTANEOUS

## 2014-11-29 MED ORDER — HEPARIN (PORCINE) IN NACL 100-0.45 UNIT/ML-% IJ SOLN
1500.0000 [IU]/h | INTRAMUSCULAR | Status: DC
  Administered 2014-11-29: 1400 [IU]/h via INTRAVENOUS
  Administered 2014-11-30: 1500 [IU]/h via INTRAVENOUS
  Filled 2014-11-29 (×3): qty 250

## 2014-11-29 NOTE — Progress Notes (Signed)
Dr. Belinda Block notified of patient's daughter's concerns regarding her feeling that patient's facial swelling has increased.  Per Dr. Verlon Au, RN should notify Dr. Benjamine Mola.

## 2014-11-29 NOTE — Progress Notes (Signed)
Joshua [name] DQQ:[id number] DOB: 17-May-1940 DOA: 11/28/2014 PCP: Tammi Sou, MD  Brief narrative: 74 y/o ? Recent admission acute rep failure multifactorial Heavy smoker 3pcl/day x 40 yrs-GOLD classII-->quit after last hospitalization asbestos exposure  DM ty II Diastolic HF CABG x4 1941 c  Pericardial effusion s/p Pericardiocentesis 06/2013 PAF Chad2Vasc2~ 5 Iron def anemia Mac degen  Admitted with sudden onset Facial swelling/cellulitis and abscess without airway compromise  Past medical history-As per Problem list Chart reviewed as below-   Consultants:  ENT  Procedures:  Ct  Antibiotics:  Vanc/Zosyn 11/23   Subjective      Objective    Interim History:   Telemetry:    Objective: Filed Vitals:   11/28/14 1454 11/28/14 1500 11/28/14 2142 11/29/14 0459  BP: 142/67 146/56 142/61 184/61  Pulse: 93 102 107 96  Temp:  99.6 F (37.6 C) 98.8 F (37.1 C) 98.9 F (37.2 C)  TempSrc:  Oral Oral Oral  Resp: _0 Height:   _1  (1.803 m) _2  (1.803 m)  Weight:   110.496 kg (243 lb 9.6 oz) 110.088 kg (242 lb 11.2 oz)  SpO2: 99% 96% 95% 94%    Intake/Output Summary (Last 24 hours) at 11/29/14 1217 Last data filed at 11/29/14 0803  Gross per 24 hour  Intake 1848.75 ml  Output   1300 ml  Net 548.75 ml    Exam:  On exam alert pleasant oriented no distress Bloodshot right eye obvious swelling to right side of face and pain over the maxilla Inside of the cheek on the right side shows area of denudation and ulceration along the tooth line as well as boggy tenderness in the cheek Chest is clinically clear S1-S2 no murmur rub or gallop Abdomen is obese nontender nondistended Range of motion intact bilaterally otherwise  Data Reviewed: Basic Metabolic Panel:  Recent Labs Lab 11/26/14 1145 11/28/14 1235 11/29/14 0216  NA 138 133* 135  K 5.3 4.5 4.1  CL 104 100* 104  CO2 _3 GLUCOSE 150* 290* 285*  BUN 24 30* 19    CREATININE 1.03 1.09 1.09  CALCIUM 9.2 8.8* 8.5*   Liver Function Tests:  Recent Labs Lab 11/29/14 0216  AST 15  ALT 19  ALKPHOS 62  BILITOT 0.6  PROT 6.0*  ALBUMIN 2.9*   No results for input(s): LIPASE, AMYLASE in the last 168 hours. No results for input(s): AMMONIA in the last 168 hours. CBC:  Recent Labs Lab 11/26/14 1145 11/28/14 1235 11/29/14 0216  WBC 8.4 8.5 9.5  NEUTROABS  --  6.4  --   HGB 12.1* 11.6* 11.4*  HCT 39.1 38.1* 35.3*  MCV 82.8 85.2 83.5  PLT 170 162 138*   Cardiac Enzymes: No results for input(s): CKTOTAL, CKMB, CKMBINDEX, TROPONINI in the last 168 hours. BNP: Invalid input(s): POCBNP CBG:  Recent Labs Lab 11/28/14 1504 11/28/14 2151 11/29/14 0754  GLUCAP 278* 331* 185*    No results found for this or any previous visit (from the past 240 hour(s)).   Studies:              All Imaging reviewed and is as per above notation   Scheduled Meds: . aspirin EC  81 mg Oral QHS  . budesonide-formoterol  2 puff Inhalation BID  . buPROPion  150 mg Oral BID  . citalopram  40 mg Oral Daily  . diltiazem  240 mg Oral Daily  . insulin aspart  0-9 Units Subcutaneous  TID WC  . insulin detemir  22 Units Subcutaneous Q2200  . isosorbide mononitrate  30 mg Oral Daily  . [START ON 11/30/2014] metFORMIN  1,000 mg Oral BID WC  . piperacillin-tazobactam (ZOSYN)  IV  3.375 g Intravenous Q8H  . ramipril  10 mg Oral BID  . tamsulosin  0.4 mg Oral QPC breakfast  . vancomycin  750 mg Intravenous Q12H   Continuous Infusions: . sodium chloride 75 mL/hr at 11/29/14 0557     Assessment/Plan: Facial cellulitis  Cellulitis and abscess of neck  Facial cellulitis with potential abscess-continue Zosyn 3.3 75 every 8 hourly which should cover all anaerobic bacteria. Vancomycin as per ENT. Allow diet  Given IV decadron for swelling 11/24  Diabetes mellitus type 2 place on every 4 coverage D/c IV saline 75 cc per hour.  Hold Amaryl 4 mg daily. Placed on  admit on Levemir to 22 units-Increased to 60 U daily 11/24 as expect CBG to increase with use of decadron We will also hold his mealtime insulin as well as metformin 1000 twice a day  Atrial fibrillation [place] score 6-continue Cardizem 240 CD  hold Xarelto 20 mg daily at bedtime Start Heparin per Pharmacy for now-transition to PO Xarelto in am f no procedure planned  Chr Diastolic Hf Will saline lock IVF Strict i/o and daily weights  Depression continue bupropion 150 twice a day, citalopram 40 daily  History CAD 2015 30 daily Continue Imdur, continue ramipril 10 twice a day. He may continue aspirin 81 daily  COPD Gold stage II-continue Symbicort 2 puffs twice a day   Iron deficiency anemia-stable currently  Macular degeneration-stable   D/w wife and family Inpatient pending resolution   Verneita Griffes, MD  [facility] Pager [phone] 11/29/2014, 12:17 PM    LOS: 1 day

## 2014-11-29 NOTE — Progress Notes (Signed)
ANTICOAGULATION CONSULT NOTE - Follow Up Consult  Pharmacy Consult for heparin Indication: atrial fibrillation  Allergies  Allergen Reactions  . Morphine And Related Other (See Comments)    Drenched with perspiration  . Demerol [Meperidine] Nausea Only  . Starlix [Nateglinide] Other (See Comments)    gassy    Patient Measurements: Height: _0  (180.3 cm) Weight: 242 lb 11.2 oz (110.088 kg) IBW/kg (Calculated) : 75.3 Heparin Dosing Weight: 99kg  Vital Signs: Temp: 97.7 F (36.5 C) (11/24 2107) Temp Source: Oral (11/24 2107) BP: 129/55 mmHg (11/24 2107) Pulse Rate: 68 (11/24 2107)  Labs:  Recent Labs  11/28/14 1235 11/29/14 0216 11/29/14 2059  HGB 11.6* 11.4*  --   HCT 38.1* 35.3*  --   PLT 162 138*  --   APTT  --   --  66*  LABPROT 27.7* 19.9*  --   INR 2.63* 1.69*  --   HEPARINUNFRC  --   --  0.83*  CREATININE 1.09 1.09  --    Estimated Creatinine Clearance: 73.9 mL/min (by C-G formula based on Cr of 1.09).  Assessment: 74 YOM on Xarelto PTA for AFib- last dose on 11/22 at 1800. Here with facial cellulitis on abx. To start IV heparin for AFib in case procedure is needed.  Baseline Hgb 11.4, plts 138 today- slight drops over the past few days, ?if it is dilutional. No acute bleeding noted.  11/24 21:40PM - HL = 0.83, aPTT = 66 seconds  Goal of Therapy:  Heparin level 0.3-0.7 units/ml (66-102 sec) Monitor platelets by anticoagulation protocol: Yes   Plan:  -Will increase Heparin rate slightly to 1500 units/hr.  -daily HL, aPTT and CBC -follow for s/s bleeding -follow for transition back to [place], PharmD., MS Clinical Pharmacist Pager:  [phone] Thank you for allowing pharmacy to be part of this patients care team. 11/29/2014 9:40 PM

## 2014-11-29 NOTE — Progress Notes (Signed)
Subjective: Pt c/o increase in right facial swelling. No breathing or swallowing difficulty.   Objective: Vital signs in last 24 hours: Temp:  [98 F (36.7 C)-99.6 F (37.6 C)] 98.9 F (37.2 C) (11/24 0459) Pulse Rate:  [89-107] 96 (11/24 0459) Resp:  [18-20] 19 (11/24 0459) BP: (107-184)/(53-67) 184/61 mmHg (11/24 0459) SpO2:  [93 %-99 %] 94 % (11/24 0459) Weight:  [104.072 kg (229 lb 7 oz)-110.496 kg (243 lb 9.6 oz)] 110.088 kg (242 lb 11.2 oz) (11/24 0459)  Physical Exam  Constitutional: He is oriented to person, place, and time. He appears well-developed and well-nourished. No distress.  Head: Normocephalic and atraumatic.  Eyes: Conjunctivae and EOM are normal. Pupils are equal, round, and reactive to light.  Ears: Normal auricles and EACs. Right midface induration. No purulent drainage. Mouth: Normal mucosa. FOM soft to palpation. No trismus.  Neck: Normal range of motion. Neck supple.  Cardiovascular: Normal rate, regular rhythm and normal heart sounds.  Pulmonary/Chest: Effort normal and breath sounds normal. No respiratory distress.  Neurological: He is alert and oriented to person, place, and time.  Skin: Skin is warm and dry. He is not diaphoretic.   Recent Labs  11/28/14 1235 11/29/14 0216  WBC 8.5 9.5  HGB 11.6* 11.4*  HCT 38.1* 35.3*  PLT 162 138*    Recent Labs  11/28/14 1235 11/29/14 0216  NA 133* 135  K 4.5 4.1  CL 100* 104  CO2 24 23  GLUCOSE 290* 285*  BUN 30* 19  CREATININE 1.09 1.09  CALCIUM 8.8* 8.5*    Medications:  I have reviewed the patient's current medications. Scheduled: . aspirin EC  81 mg Oral QHS  . budesonide-formoterol  2 puff Inhalation BID  . buPROPion  150 mg Oral BID  . citalopram  40 mg Oral Daily  . diltiazem  240 mg Oral Daily  . insulin aspart  0-9 Units Subcutaneous TID WC  . insulin detemir  22 Units Subcutaneous Q2200  . isosorbide mononitrate  30 mg Oral Daily  . [START ON 11/30/2014] metFORMIN   1,000 mg Oral BID WC  . piperacillin-tazobactam (ZOSYN)  IV  3.375 g Intravenous Q8H  . ramipril  10 mg Oral BID  . tamsulosin  0.4 mg Oral QPC breakfast  . vancomycin  750 mg Intravenous Q12H   AYO:KHTXHFS, traMADol  Assessment/Plan: Right facial cellulitis. No palpable abscess. Continue with IV abx. Consider 1x decadron. Blood sugar monitoring. Will follow.    LOS: 1 day   Shawndell Schillaci,SUI W 11/29/2014, 9:44 AM

## 2014-11-29 NOTE — Progress Notes (Signed)
ANTICOAGULATION CONSULT NOTE - Initial Consult  Pharmacy Consult for heparin Indication: atrial fibrillation  Allergies  Allergen Reactions  . Morphine And Related Other (See Comments)    Drenched with perspiration  . Demerol [Meperidine] Nausea Only  . Starlix [Nateglinide] Other (See Comments)    gassy    Patient Measurements: Height: _0  (180.3 cm) Weight: 242 lb 11.2 oz (110.088 kg) IBW/kg (Calculated) : 75.3 Heparin Dosing Weight: 99kg  Vital Signs: Temp: 98.9 F (37.2 C) (11/24 0459) Temp Source: Oral (11/24 0459) BP: 184/61 mmHg (11/24 0459) Pulse Rate: 96 (11/24 0459)  Labs:  Recent Labs  11/28/14 1235 11/29/14 0216  HGB 11.6* 11.4*  HCT 38.1* 35.3*  PLT 162 138*  LABPROT 27.7* 19.9*  INR 2.63* 1.69*  CREATININE 1.09 1.09    Estimated Creatinine Clearance: 73.9 mL/min (by C-G formula based on Cr of 1.09).  Assessment: 74 YOM on Xarelto PTA for AFib- last dose on 11/22 at 1800. Here with facial cellulitis on abx. To start IV heparin for AFib in case procedure is needed.  Baseline Hgb 11.4, plts 138 today- slight drops over the past few days, ?if it is dilutional. No acute bleeding noted.   Goal of Therapy:  Heparin level 0.3-0.7 units/ml Monitor platelets by anticoagulation protocol: Yes   Plan:  -heparin bolus with 4000 units IV x1, then start infusion rate at 1400 units/hr -will check HL and aPTT in 8h to ensure they correlate -daily HL, aPTT and CBC -follow for s/s bleeding -follow for transition back to [place] D. Ramello Cordial, PharmD, BCPS Clinical Pharmacist Pager: [phone] 11/29/2014 12:44 PM

## 2014-11-29 NOTE — Progress Notes (Signed)
Text paged Dr. Benjamine Mola to inform him that patient's daughter feels that her father's facial swelling is increasing.  She wanted the MD notified.

## 2014-11-30 ENCOUNTER — Ambulatory Visit: Payer: Medicare Other | Admitting: *Deleted

## 2014-11-30 DIAGNOSIS — L03211 Cellulitis of face: Principal | ICD-10-CM

## 2014-11-30 LAB — GLUCOSE, CAPILLARY
GLUCOSE-CAPILLARY: 299 mg/dL — AB (ref 65–99)
GLUCOSE-CAPILLARY: 281 mg/dL — AB (ref 65–99)
GLUCOSE-CAPILLARY: 250 mg/dL — AB (ref 65–99)
GLUCOSE-CAPILLARY: 210 mg/dL — AB (ref 65–99)

## 2014-11-30 LAB — COMPREHENSIVE METABOLIC PANEL
ALT: 21 U/L (ref 17–63)
ANION GAP: 12 (ref 5–15)
AST: 20 U/L (ref 15–41)
Albumin: 2.9 g/dL — ABNORMAL LOW (ref 3.5–5.0)
Alkaline Phosphatase: 60 U/L (ref 38–126)
BILIRUBIN TOTAL: 0.5 mg/dL (ref 0.3–1.2)
BUN: 25 mg/dL — AB (ref 6–20)
CHLORIDE: 104 mmol/L (ref 101–111)
CO2: 17 mmol/L — ABNORMAL LOW (ref 22–32)
Calcium: 8.6 mg/dL — ABNORMAL LOW (ref 8.9–10.3)
Creatinine, Ser: 1.21 mg/dL (ref 0.61–1.24)
GFR calc non Af Amer: 57 mL/min — ABNORMAL LOW (ref >60)
Glucose, Bld: 329 mg/dL — ABNORMAL HIGH (ref 65–99)
POTASSIUM: 4.4 mmol/L (ref 3.5–5.1)
Sodium: 133 mmol/L — ABNORMAL LOW (ref 135–145)
TOTAL PROTEIN: 6 g/dL — AB (ref 6.5–8.1)

## 2014-11-30 LAB — APTT: APTT: 68 s — AB (ref 24–37)

## 2014-11-30 LAB — PROTIME-INR
INR: 1.3 (ref 0.00–1.49)
PROTHROMBIN TIME: 16.3 s — AB (ref 11.6–15.2)

## 2014-11-30 LAB — HEPARIN LEVEL (UNFRACTIONATED): HEPARIN UNFRACTIONATED: 0.69 [IU]/mL (ref 0.30–0.70)

## 2014-11-30 MED ORDER — CLINDAMYCIN HCL 300 MG PO CAPS
300.0000 mg | ORAL_CAPSULE | Freq: Three times a day (TID) | ORAL | Status: DC
  Administered 2014-11-30 – 2014-12-01 (×3): 300 mg via ORAL
  Filled 2014-11-30 (×3): qty 1

## 2014-11-30 MED ORDER — RIVAROXABAN 20 MG PO TABS: 20.0000 mg | ORAL_TABLET | Freq: Every day | ORAL | Status: DC

## 2014-11-30 NOTE — Progress Notes (Signed)
Subjective: Facial pain has improved. No new issues.  Objective: Vital signs in last 24 hours: Temp:  [97.7 F (36.5 C)-99.7 F (37.6 C)] 98.4 F (36.9 C) (11/25 0550) Pulse Rate:  [62-68] 67 (11/25 0937) Resp:  [18-19] 18 (11/25 0937) BP: (102-129)/(41-61) 116/47 mmHg (11/25 0550) SpO2:  [92 %-97 %] 93 % (11/25 0937) Weight:  [112.084 kg (247 lb 1.6 oz)-112.9 kg (248 lb 14.4 oz)] 112.084 kg (247 lb 1.6 oz) (11/25 0550)  Physical Exam  Constitutional: He is oriented to person, place, and time. He appears well-developed and well-nourished. No distress.  Head: Normocephalic and atraumatic.  Eyes: Pupils are equal, round, and reactive to light.  Ears: Normal auricles and EACs. Right midface induration has decreased. No purulent drainage. Mouth: Normal mucosa. FOM soft to palpation. No trismus.  Neck: Normal range of motion. Neck supple.  Cardiovascular: Normal rate, regular rhythm and normal heart sounds.  Pulmonary/Chest: Effort normal and breath sounds normal. No respiratory distress.  Neurological: He is alert and oriented to person, place, and time.  Skin: Skin is warm and dry. He is not diaphoretic.            Recent Labs  11/28/14 1235 11/29/14 0216  WBC 8.5 9.5  HGB 11.6* 11.4*  HCT 38.1* 35.3*  PLT 162 138*    Recent Labs  11/29/14 0216 11/29/14 1823 11/30/14 0441  NA 135  --  133*  K 4.1  --  4.4  CL 104  --  104  CO2 23  --  17*  GLUCOSE 285* 549* 329*  BUN 19  --  25*  CREATININE 1.09  --  1.21  CALCIUM 8.5*  --  8.6*    Medications:  I have reviewed the patient's current medications. Scheduled: . aspirin EC  81 mg Oral QHS  . budesonide-formoterol  2 puff Inhalation BID  . buPROPion  150 mg Oral BID  . citalopram  40 mg Oral Daily  . diltiazem  240 mg Oral Daily  . insulin aspart  0-15 Units Subcutaneous TID WC  . insulin aspart  0-5 Units Subcutaneous QHS  . insulin aspart  6 Units Subcutaneous TID WC  . insulin detemir  60  Units Subcutaneous Q2200  . isosorbide mononitrate  30 mg Oral Daily  . piperacillin-tazobactam (ZOSYN)  IV  3.375 g Intravenous Q8H  . ramipril  10 mg Oral BID  . tamsulosin  0.4 mg Oral QPC breakfast  . vancomycin  750 mg Intravenous Q12H   HDQ:QIWLNLG, traMADol  Assessment/Plan: Right facial cellulitis. Improved with IV abx.  No palpable abscess. Continue with IV abx for 24 hours. If he continues to improve, may d/c home on oral clindamycin tomorrow. Will follow.    LOS: 2 days   Ozella Comins,SUI W 11/30/2014, 11:11 AM

## 2014-11-30 NOTE — Care Management Note (Signed)
Case Management Note  Patient Details  Name: Joshua [name] MRN: [id number] Date of Birth: [birth year]  Subjective/Objective:                    Action/Plan:   Expected Discharge Date:  12/03/14               Expected Discharge Plan:  Home/Self Care  In-House Referral:     Discharge planning Services     Post Acute Care Choice:    Choice offered to:     DME Arranged:    DME Agency:     HH Arranged:    HH Agency:     Status of Service:  In process, will continue to follow  Medicare Important Message Given:    Date Medicare IM Given:    Medicare IM give by:    Date Additional Medicare IM Given:    Additional Medicare Important Message give by:     If discussed at [place] of Stay Meetings, dates discussed:    Additional Comments: Initial UR completed. Marilu Favre, RN 11/30/2014, 8:26 AM

## 2014-11-30 NOTE — Progress Notes (Signed)
JACCOB CZAPLICKI UKR:[id number] DOB: 1940/03/05 DOA: 11/28/2014 PCP: Tammi Sou, MD  Brief narrative: 74 y/o ? Recent admission acute rep failure multifactorial Heavy smoker 3pcl/day x 40 yrs-GOLD classII-->quit after last hospitalization asbestos exposure  DM ty II Diastolic HF CABG x4 5436 c  Pericardial effusion s/p Pericardiocentesis 06/2013 PAF Chad2Vasc2~ 5 Iron def anemia Mac degen  Admitted with sudden onset Facial swelling/cellulitis and abscess without airway compromise  Past medical history-As per Problem list Chart reviewed as below-   Consultants:  ENT  Procedures:  Ct  Antibiotics:  Vanc/Zosyn 11/23   Subjective   Well  No fever no cp tol diet Swelling is better   Objective    Interim History:   Telemetry:    Objective: Filed Vitals:   11/30/14 0500 11/30/14 0550 11/30/14 0937 11/30/14 1448  BP:  116/47  117/49  Pulse:  68 67 62  Temp:  98.4 F (36.9 C)  98.4 F (36.9 C)  TempSrc:  Oral  Oral  Resp:  _0 Height:      Weight: 112.9 kg (248 lb 14.4 oz) 112.084 kg (247 lb 1.6 oz)    SpO2:  92% 93% 95%    Intake/Output Summary (Last 24 hours) at 11/30/14 1504 Last data filed at 11/30/14 1450  Gross per 24 hour  Intake 2747.9 ml  Output   1825 ml  Net  922.9 ml    Exam:  On exam alert pleasant oriented no distress Bloodshot right eye-maxilla swelling less Chest is clinically clear S1-S2 no murmur rub or gallop Abdomen is obese nontender nondistended Range of motion intact bilaterally otherwise  Data Reviewed: Basic Metabolic Panel:  Recent Labs Lab 11/26/14 1145 11/28/14 1235 11/29/14 0216 11/29/14 1823 11/30/14 0441  NA 138 133* 135  --  133*  K 5.3 4.5 4.1  --  4.4  CL 104 100* 104  --  104  CO2 _1 --  17*  GLUCOSE 150* 290* 285* 549* 329*  BUN 24 30* 19  --  25*  CREATININE 1.03 1.09 1.09  --  1.21  CALCIUM 9.2 8.8* 8.5*  --  8.6*   Liver Function Tests:  Recent Labs Lab  11/29/14 0216 11/30/14 0441  AST 15 20  ALT 19 21  ALKPHOS 62 60  BILITOT 0.6 0.5  PROT 6.0* 6.0*  ALBUMIN 2.9* 2.9*   No results for input(s): LIPASE, AMYLASE in the last 168 hours. No results for input(s): AMMONIA in the last 168 hours. CBC:  Recent Labs Lab 11/26/14 1145 11/28/14 1235 11/29/14 0216  WBC 8.4 8.5 9.5  NEUTROABS  --  6.4  --   HGB 12.1* 11.6* 11.4*  HCT 39.1 38.1* 35.3*  MCV 82.8 85.2 83.5  PLT 170 162 138*   Cardiac Enzymes: No results for input(s): CKTOTAL, CKMB, CKMBINDEX, TROPONINI in the last 168 hours. BNP: Invalid input(s): POCBNP CBG:  Recent Labs Lab 11/29/14 1726 11/29/14 1829 11/29/14 2113 11/30/14 0748 11/30/14 1149  GLUCAP 514* 521* 352* 299* 281*    No results found for this or any previous visit (from the past 240 hour(s)).   Studies:              All Imaging reviewed and is as per above notation   Scheduled Meds: . aspirin EC  81 mg Oral QHS  . budesonide-formoterol  2 puff Inhalation BID  . buPROPion  150 mg Oral BID  . citalopram  40 mg Oral Daily  . clindamycin  300 mg Oral 3 times per day  . diltiazem  240 mg Oral Daily  . insulin aspart  0-15 Units Subcutaneous TID WC  . insulin aspart  0-5 Units Subcutaneous QHS  . insulin aspart  6 Units Subcutaneous TID WC  . insulin detemir  60 Units Subcutaneous Q2200  . isosorbide mononitrate  30 mg Oral Daily  . ramipril  10 mg Oral BID  . tamsulosin  0.4 mg Oral QPC breakfast   Continuous Infusions: . sodium chloride 50 mL/hr at 11/29/14 1340     Assessment/Plan: Facial cellulitis  Cellulitis and abscess of neck  Facial cellulitis with potential abscess-continue Zosyn 3.3 75 every 8 hourly which should cover all anaerobic bacteria. Vancomycin as per ENT. Allow diet  Given IV decadron for swelling 11/24  Diabetes mellitus type 2 place on every 4 coverage D/c IV saline 75 cc per hour.  Hold Amaryl 4 mg daily. Placed on admit on Levemir to 22 units-Increased to  60 U daily 11/24 as expect CBG to increase with use of decadron hold his mealtime insulin as well as metformin 1000 twice a day CBG's 500's 11/24-resolved with increase of SSI to Resistant coverage  Atrial fibrillation [place] score 6-continue Cardizem 240 CD  hold Xarelto 20 mg daily at bedtime Start Heparin per Pharmacy for now-transition to PO Xarelto on d/c home  Chr Diastolic Hf Will saline lock IVF Strict i/o and daily weights  Depression continue bupropion 150 twice a day, citalopram 40 daily  History CAD 2015 30 daily Continue Imdur, continue ramipril 10 twice a day. He may continue aspirin 81 daily  COPD Gold stage II-continue Symbicort 2 puffs twice a day   Iron deficiency anemia-stable currently  Macular degeneration-stable   D/w wife and family Inpatient pending resolution   Verneita Griffes, MD  [facility] Pager [phone] 11/30/2014, 3:04 PM    LOS: 2 days

## 2014-11-30 NOTE — Progress Notes (Signed)
[facility] for heparin Indication: atrial fibrillation  Allergies  Allergen Reactions  . Morphine And Related Other (See Comments)    Drenched with perspiration  . Demerol [Meperidine] Nausea Only  . Starlix [Nateglinide] Other (See Comments)    gassy    Patient Measurements: Height: _0  (180.3 cm) Weight: 247 lb 1.6 oz (112.084 kg) IBW/kg (Calculated) : 75.3 Heparin Dosing Weight: 99kg  Vital Signs: Temp: 98.4 F (36.9 C) (11/25 0550) Temp Source: Oral (11/25 0550) BP: 116/47 mmHg (11/25 0550) Pulse Rate: 67 (11/25 0937)  Labs:  Recent Labs  11/28/14 1235 11/29/14 0216 11/29/14 2059 11/30/14 0441  HGB 11.6* 11.4*  --   --   HCT 38.1* 35.3*  --   --   PLT 162 138*  --   --   APTT  --   --  66* 68*  LABPROT 27.7* 19.9*  --  16.3*  INR 2.63* 1.69*  --  1.30  HEPARINUNFRC  --   --  0.83* 0.69  CREATININE 1.09 1.09  --  1.21    Estimated Creatinine Clearance: 67.1 mL/min (by C-G formula based on Cr of 1.21).  Assessment: 74 YOM on Xarelto PTA for AFib- last dose on 11/22 at 1800. Here with facial cellulitis on abx.  Started on IV heparin in case procedure is needed- per ENT notes, that is not part of the plan. No orders to resume Xarelto yet.  Heparin level and aPTT correlating this morning and both in therapeutic range.  CBC stable, no bleeding noted   Goal of Therapy:  Heparin level 0.3-0.7 units/ml Monitor platelets by anticoagulation protocol: Yes   Plan:  -heparin at 1500 units/hr -daily HL and CBC -follow for s/s bleeding -follow for transition back to [place] D. Damaris Abeln, PharmD, BCPS Clinical Pharmacist Pager: [phone] 11/30/2014 2:12 PM

## 2014-12-01 LAB — GLUCOSE, CAPILLARY
GLUCOSE-CAPILLARY: 60 mg/dL — AB (ref 65–99)
Glucose-Capillary: 123 mg/dL — ABNORMAL HIGH (ref 65–99)
Glucose-Capillary: 169 mg/dL — ABNORMAL HIGH (ref 65–99)

## 2014-12-01 LAB — CBC
HCT: 33.1 % — ABNORMAL LOW (ref 39.0–52.0)
Hemoglobin: 10.6 g/dL — ABNORMAL LOW (ref 13.0–17.0)
MCH: 26.6 pg (ref 26.0–34.0)
MCHC: 32 g/dL (ref 30.0–36.0)
MCV: 83 fL (ref 78.0–100.0)
Platelets: 188 10*3/uL (ref 150–400)
RBC: 3.99 MIL/uL — ABNORMAL LOW (ref 4.22–5.81)
RDW: 16.8 % — AB (ref 11.5–15.5)
WBC: 7.3 10*3/uL (ref 4.0–10.5)

## 2014-12-01 LAB — HEPARIN LEVEL (UNFRACTIONATED)

## 2014-12-01 MED ORDER — CLINDAMYCIN HCL 300 MG PO CAPS: 300.0000 mg | ORAL_CAPSULE | Freq: Four times a day (QID) | ORAL | Status: DC

## 2014-12-01 MED ORDER — CLINDAMYCIN PHOSPHATE 900 MG/50ML IV SOLN
900.0000 mg | Freq: Once | INTRAVENOUS | Status: AC
  Administered 2014-12-01: 900 mg via INTRAVENOUS
  Filled 2014-12-01: qty 50

## 2014-12-01 NOTE — Progress Notes (Signed)
Discharge home. Home discharge instruction given, no question verbalized. 

## 2014-12-01 NOTE — Discharge Summary (Signed)
Physician Discharge Summary  Joshua [name] MLY:[id number] DOB: [birth year] DOA: 11/28/2014  PCP: Tammi Sou, MD  Admit date: 11/28/2014 Discharge date: 12/01/2014  Time spent: 32 minutes  Recommendations for Outpatient Follow-up:  1. Patient to continue clindamycin 4 times a day until seen by ENT and will complete a total of 10 days treatment which would be ending on 12/07/14 2. May resume anticoagulation   Discharge Diagnoses:  Active Problems:   Facial cellulitis   Cellulitis and abscess of neck   Discharge Condition: Fair  Diet recommendation: Heart healthy low-salt  Filed Weights   11/30/14 0500 11/30/14 0550 12/01/14 0500  Weight: 112.9 kg (248 lb 14.4 oz) 112.084 kg (247 lb 1.6 oz) 114.9 kg (253 lb 4.9 oz)    History of present illness:  74 y/o ? Recent admission acute rep failure multifactorial Heavy smoker 3pcl/day x 40 yrs-GOLD classII-->quit after last hospitalization asbestos exposure  DM ty II Diastolic HF CABG x4 8127 c  Pericardial effusion s/p Pericardiocentesis 06/2013 PAF Chad2Vasc2~ 5 Iron def anemia Mac degen  Admitted with sudden onset Facial swelling/cellulitis and abscess without airway compromise  Hospital Course:   Facial cellulitis with potential abscess-continue Zosyn 3.3 75 every 8 hourly which should cover all anaerobic bacteria. Vancomycin as per ENT. Allow diet  Given IV decadron for swelling 11/24 Transitioned ultimately to clindamycin by mouth 300 mg 4 times a day which will continue until 12/07/14  Diabetes mellitus type 2 place on every 4 coverage Initially was it on Levemir to 22 units-Increased to 60 U daily 11/24 as expect CBG to increase with use of decadron insulin and had CBG's 500's 11/24-some marginal low blood sugars as well but this resolved On admission Amaryl 4 mg daily. metformin 1000 twice a day were held however resumed subsequently resolved with increase of SSI to Resistant coverage  Atrial fibrillation  Mali score 6-continue Cardizem 240 CD  We did hold Xarelto 20 mg daily at bedtime while hospitalized because of need for potential surgery however this was resumed  Chr Diastolic Hf Will saline lock IVF Strict i/o and daily weights  Depression continue bupropion 150 twice a day, citalopram 40 daily  History CAD 2015 30 daily Continue Imdur, continue ramipril 10 twice a day. He may continue aspirin 81 daily  COPD Gold stage II-continue Symbicort 2 puffs twice a day   Iron deficiency anemia-stable currently  Macular degeneration-stable  Consultations:  ENT  Discharge Exam: Filed Vitals:   12/01/14 0451 12/01/14 0656  BP: 89/54 150/52  Pulse: 66   Temp: 98.2 F (36.8 C)   Resp: 18     General: Fair. No pain No fever No chills  Cardiovascular: S2 S1 S2 no murmur rub or gallop Respiratory: Clinically clear no added sound  Discharge Instructions   Discharge Instructions    Diet - low sodium heart healthy    Complete by:  As directed      Discharge instructions    Complete by:  As directed   Finish all antibiotics completely and see surgeon     Increase activity slowly    Complete by:  As directed           Current Discharge Medication List    START taking these medications   Details  clindamycin (CLEOCIN) 300 MG capsule Take 1 capsule (300 mg total) by mouth 4 (four) times daily. Qty: 40 capsule, Refills: 0      CONTINUE these medications which have NOT CHANGED   Details  acetaminophen (TYLENOL)  325 MG tablet Take 2 tablets (650 mg total) by mouth every 4 (four) hours as needed for headache or mild pain.    aspirin 81 MG tablet Take 81 mg by mouth at bedtime.     budesonide-formoterol (SYMBICORT) 160-4.5 MCG/ACT inhaler Inhale 2 puffs into the lungs 2 (two) times daily. Qty: 1 Inhaler, Refills: 12    buPROPion (ZYBAN) 150 MG 12 hr tablet Take 150 mg by mouth 2 (two) times daily.    Chromium Picolinate 1000 MCG TABS Take 1,000 mcg by mouth daily.      citalopram (CELEXA) 40 MG tablet Take 1 tablet (40 mg total) by mouth daily. Qty: 90 tablet, Refills: 3    diltiazem (CARDIZEM CD) 240 MG 24 hr capsule Take 1 capsule (240 mg total) by mouth daily. Qty: 30 capsule, Refills: 1    furosemide (LASIX) 20 MG tablet Take 1 tablet (20 mg total) by mouth daily. Qty: 90 tablet, Refills: 3    glimepiride (AMARYL) 4 MG tablet Take 4 mg by mouth 2 (two) times daily.    guaiFENesin (MUCINEX) 600 MG 12 hr tablet Take 2 tablets (1,200 mg total) by mouth 2 (two) times daily. Qty: 30 tablet, Refills: 0    Insulin Detemir (LEVEMIR FLEXPEN) 100 UNIT/ML Pen 55 U SQ qhs Qty: 50 mL, Refills: 1    isosorbide mononitrate (IMDUR) 30 MG 24 hr tablet Take 1 tablet (30 mg total) by mouth daily. Qty: 90 tablet, Refills: 1    lovastatin (MEVACOR) 40 MG tablet Take 1 tablet (40 mg total) by mouth 2 (two) times daily. Qty: 180 tablet, Refills: 1    metFORMIN (GLUCOPHAGE) 1000 MG tablet Take 1 tablet (1,000 mg total) by mouth 2 (two) times daily with a meal. Qty: 180 tablet, Refills: 1    Multiple Vitamin (MULTIVITAMIN WITH MINERALS) TABS Take 1 tablet by mouth daily.    potassium chloride (K-DUR) 10 MEQ tablet Take 1 tablet (10 mEq total) by mouth daily. Qty: 90 tablet, Refills: 3    ramipril (ALTACE) 10 MG capsule Take 10 mg by mouth 2 (two) times daily.     rivaroxaban (XARELTO) 20 MG TABS tablet Take 1 tablet (20 mg total) by mouth daily with supper. Qty: 90 tablet, Refills: 4    tamsulosin (FLOMAX) 0.4 MG CAPS capsule TAKE 1 CAPSULE BY MOUTH EVERY DAY AFTER SUPPER Qty: 90 capsule, Refills: 1      STOP taking these medications     Cholecalciferol (VITAMIN D-3) 1000 UNITS CAPS      insulin regular (NOVOLIN R,HUMULIN R) 100 units/mL injection        Allergies  Allergen Reactions  . Morphine And Related Other (See Comments)    Drenched with perspiration  . Demerol [Meperidine] Nausea Only  . Starlix [Nateglinide] Other (See Comments)    gassy    Follow-up Information    Follow up with Tammi Sou, MD.   Specialty:  Family Medicine   Why:  As needed   Contact information:   [address] [place] [address] [phone]       Follow up with [facility].   Specialty:  Emergency Medicine   Why:  As needed, If symptoms worsen   Contact information:   [place] [id number] [place] [phone]       The results of significant diagnostics from this hospitalization (including imaging, microbiology, ancillary and laboratory) are listed below for reference.    Significant  Diagnostic Studies: Ct Head W Wo Contrast  11/28/2014  CLINICAL DATA:  Right-sided facial swelling and pain began yesterday. Swelling about the right eye. EXAM: CT HEAD WITHOUT AND WITH CONTRAST CT MAXILLOFACIAL WITH CONTRAST TECHNIQUE: Multidetector CT imaging of the head was performed following the standard protocol before and after bolus injection of intravenous contrast. Multidetector CT imaging of the face were performed following the standard protocol during bolus administration of intravenous contrast. CONTRAST:  55m OMNIPAQUE IOHEXOL 300 MG/ML  SOLN COMPARISON:  None. FINDINGS: CT HEAD FINDINGS A remote lacunar infarct is present at the right globus pallidus. The basal ganglia are otherwise intact. The insular ribbon is normal bilaterally. The cortex is intact. No acute infarct, hemorrhage, or mass lesion is present. The ventricles are of normal size. No significant extra-axial fluid collection is present. The paranasal sinuses and mastoid air cells are clear. Atherosclerotic calcifications are present within the cavernous internal carotid arteries. Extensive soft tissue CT MAXILLOFACIAL FINDINGS Extensive subcutaneous edema is present over the right side of the face. A polyp or mucous retention cyst is present in the right maxillary sinus. There is mild mucosal thickening in  the right maxillary sinus. The paranasal sinuses are otherwise clear. Edematous changes are noted about the right submandibular gland. There is no evidence for duct obstruction. The salivary glands are otherwise within normal limits. Soft tissue swelling extends to the inferior right periorbital soft tissues. There is no retro-orbital stranding or inflammation. No discrete abscess is present. Although the patient is edentulous, the center the inflammation appears to be adjacent to the right side of the maxilla or a subperiosteal fluid collection measures 2.5 x 1.0 cm. No focal tooth root infection is associated. The mandible is intact and located. Degenerative anterolisthesis at C3-4 measures 4.5 mm. IMPRESSION: 1. Extensive edema and soft tissue swelling over the right side of face. 2. The center of the inflammatory process appears to be a low-density fluid collection adjacent to the right-sided the maxilla measuring 2.5 x 1.0 cm on the axillary images. 3. This could be related to right-sided maxillary sinus disease although the sinus disease is minimal. 4. The patient is edentulous without an odontogenic source for the infection. 5. The inflammatory changes extend to the inferior aspect of the right orbit. 6. 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 7. No acute intracranial abnormality. There is no evidence for intracranial extension of inflammatory process. 8. Grade 1 anterolisthesis at C3-4 measures 4.5 mm. Electronically Signed   By: CSan MorelleM.D.   On: 11/28/2014 13:42   Ct Maxillofacial W/cm  11/28/2014  CLINICAL DATA:  Right-sided facial swelling and pain began yesterday. Swelling about the right eye. EXAM: CT HEAD WITHOUT AND WITH CONTRAST CT MAXILLOFACIAL WITH CONTRAST TECHNIQUE: Multidetector CT imaging of the head was performed following the standard protocol before and after bolus injection of intravenous contrast. Multidetector CT imaging of the face were performed following  the standard protocol during bolus administration of intravenous contrast. CONTRAST:  562mOMNIPAQUE IOHEXOL 300 MG/ML  SOLN COMPARISON:  None. FINDINGS: CT HEAD FINDINGS A remote lacunar infarct is present at the right globus pallidus. The basal ganglia are otherwise intact. The insular ribbon is normal bilaterally. The cortex is intact. No acute infarct, hemorrhage, or mass lesion is present. The ventricles are of normal size. No significant extra-axial fluid collection is present. The paranasal sinuses and mastoid air cells are clear. Atherosclerotic calcifications are present within the cavernous internal carotid arteries. Extensive soft tissue CT MAXILLOFACIAL FINDINGS Extensive  subcutaneous edema is present over the right side of the face. A polyp or mucous retention cyst is present in the right maxillary sinus. There is mild mucosal thickening in the right maxillary sinus. The paranasal sinuses are otherwise clear. Edematous changes are noted about the right submandibular gland. There is no evidence for duct obstruction. The salivary glands are otherwise within normal limits. Soft tissue swelling extends to the inferior right periorbital soft tissues. There is no retro-orbital stranding or inflammation. No discrete abscess is present. Although the patient is edentulous, the center the inflammation appears to be adjacent to the right side of the maxilla or a subperiosteal fluid collection measures 2.5 x 1.0 cm. No focal tooth root infection is associated. The mandible is intact and located. Degenerative anterolisthesis at C3-4 measures 4.5 mm. IMPRESSION: 1. Extensive edema and soft tissue swelling over the right side of face. 2. The center of the inflammatory process appears to be a low-density fluid collection adjacent to the right-sided the maxilla measuring 2.5 x 1.0 cm on the axillary images. 3. This could be related to right-sided maxillary sinus disease although the sinus disease is minimal. 4. The  patient is edentulous without an odontogenic source for the infection. 5. The inflammatory changes extend to the inferior aspect of the right orbit. 6. 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 7. No acute intracranial abnormality. There is no evidence for intracranial extension of inflammatory process. 8. Grade 1 anterolisthesis at C3-4 measures 4.5 mm. Electronically Signed   By: San Morelle M.D.   On: 11/28/2014 13:42    Microbiology: No results found for this or any previous visit (from the past 240 hour(s)).   Labs: Basic Metabolic Panel:  Recent Labs Lab 11/26/14 1145 11/28/14 1235 11/29/14 0216 11/29/14 1823 11/30/14 0441  NA 138 133* 135  --  133*  K 5.3 4.5 4.1  --  4.4  CL 104 100* 104  --  104  CO2 _0 --  17*  GLUCOSE 150* 290* 285* 549* 329*  BUN 24 30* 19  --  25*  CREATININE 1.03 1.09 1.09  --  1.21  CALCIUM 9.2 8.8* 8.5*  --  8.6*   Liver Function Tests:  Recent Labs Lab 11/29/14 0216 11/30/14 0441  AST 15 20  ALT 19 21  ALKPHOS 62 60  BILITOT 0.6 0.5  PROT 6.0* 6.0*  ALBUMIN 2.9* 2.9*   No results for input(s): LIPASE, AMYLASE in the last 168 hours. No results for input(s): AMMONIA in the last 168 hours. CBC:  Recent Labs Lab 11/26/14 1145 11/28/14 1235 11/29/14 0216 12/01/14 0510  WBC 8.4 8.5 9.5 7.3  NEUTROABS  --  6.4  --   --   HGB 12.1* 11.6* 11.4* 10.6*  HCT 39.1 38.1* 35.3* 33.1*  MCV 82.8 85.2 83.5 83.0  PLT 170 162 138* 188   Cardiac Enzymes: No results for input(s): CKTOTAL, CKMB, CKMBINDEX, TROPONINI in the last 168 hours. BNP: BNP (last 3 results)  Recent Labs  10/29/14 1215  BNP 402.0*    ProBNP (last 3 results) No results for input(s): PROBNP in the last 8760 hours.  CBG:  Recent Labs Lab 11/30/14 1149 11/30/14 1655 11/30/14 2045 12/01/14 0732 12/01/14 0824  GLUCAP 281* 250* 210* 60* 123*       Signed:  Nita Sells  [facility] 12/01/2014, 11:20 AM

## 2014-12-01 NOTE — Progress Notes (Signed)
Subjective: Facial pain has decreased. No new issues.  Objective: Vital signs in last 24 hours: Temp:  [98.2 F (36.8 C)-98.7 F (37.1 C)] 98.2 F (36.8 C) (11/26 0451) Pulse Rate:  [62-75] 66 (11/26 0451) Resp:  [18-19] 18 (11/26 0451) BP: (89-150)/(47-54) 150/52 mmHg (11/26 0656) SpO2:  [93 %-100 %] 100 % (11/26 0451) Weight:  [114.9 kg (253 lb 4.9 oz)] 114.9 kg (253 lb 4.9 oz) (11/26 0500)  Physical Exam  Constitutional: He is oriented to person, place, and time. He appears well-developed and well-nourished. No distress.  Head: Normocephalic and atraumatic.  Eyes: Pupils are equal, round, and reactive to light.  Ears: Normal auricles and EACs. Right midface induration has decreased. No purulent drainage. Mouth: Normal mucosa. FOM soft to palpation. No trismus.  Neck: Normal range of motion. Neck supple.  Cardiovascular: Normal rate, regular rhythm and normal heart sounds.  Pulmonary/Chest: Effort normal and breath sounds normal. No respiratory distress.  Neurological: He is alert and oriented to person, place, and time.  Skin: Skin is warm and dry. He is not diaphoretic.                Recent Labs  11/29/14 0216 12/01/14 0510  WBC 9.5 7.3  HGB 11.4* 10.6*  HCT 35.3* 33.1*  PLT 138* 188    Recent Labs  11/29/14 0216 11/29/14 1823 11/30/14 0441  NA 135  --  133*  K 4.1  --  4.4  CL 104  --  104  CO2 23  --  17*  GLUCOSE 285* 549* 329*  BUN 19  --  25*  CREATININE 1.09  --  1.21  CALCIUM 8.5*  --  8.6*    Medications:  I have reviewed the patient's current medications. Scheduled: . aspirin EC  81 mg Oral QHS  . budesonide-formoterol  2 puff Inhalation BID  . buPROPion  150 mg Oral BID  . citalopram  40 mg Oral Daily  . clindamycin  300 mg Oral 3 times per day  . diltiazem  240 mg Oral Daily  . insulin aspart  0-15 Units Subcutaneous TID WC  . insulin aspart  0-5 Units Subcutaneous QHS  . insulin aspart  6 Units Subcutaneous TID WC   . insulin detemir  60 Units Subcutaneous Q2200  . isosorbide mononitrate  30 mg Oral Daily  . ramipril  10 mg Oral BID  . tamsulosin  0.4 mg Oral QPC breakfast   GEZ:MOQHUTM, traMADol  Assessment/Plan: Right facial cellulitis. Improved with IV abx. No palpable abscess. May d/c home on oral clindamycin (331m QID (not TID) for 10 days). Pt may f/u in my office early next week.   LOS: 3 days   Tynasia Mccaul,SUI W 12/01/2014, 9:15 AM

## 2014-12-03 ENCOUNTER — Telehealth: Payer: Self-pay

## 2014-12-03 ENCOUNTER — Other Ambulatory Visit: Payer: Self-pay | Admitting: *Deleted

## 2014-12-03 DIAGNOSIS — L03211 Cellulitis of face: Secondary | ICD-10-CM | POA: Diagnosis not present

## 2014-12-03 MED ORDER — ALBUTEROL SULFATE HFA 108 (90 BASE) MCG/ACT IN AERS: 2.0000 | INHALATION_SPRAY | RESPIRATORY_TRACT | Status: DC | PRN

## 2014-12-03 NOTE — Telephone Encounter (Signed)
**Note De-Identified  Obfuscation** The pt has be scheduled to see Richardson Dopp, PA-c on 12/06/14 at 11:10.

## 2014-12-03 NOTE — Telephone Encounter (Signed)
**Note De-Identified  Obfuscation** JV    ----- Message -----     From: Deloria Lair, NP     Sent: 11/20/2014  8:01 AM      To: Tammi Sou, MD, Jettie Booze, MD            Dear Dr. Anitra Lauth and Dr. Irish Lack - attached is my initial home visit assessment - please review my findings.        Pt needs cardiology visit scheduled ASAP - I will call office this am. HF regimen needs re-evaluation.        Thank you both for your collaboration!        Deloria Lair [facility]    [place]    [phone]

## 2014-12-03 NOTE — Patient Outreach (Signed)
[facility]) Care Management  12/03/2014  Joshua [name] 1940/08/20 [id number]   S:  Pt readmitted within the 30 day window, unrelated to HF, a new problem: cellulitis of his face. He was treated with IV antibiotics and sent home on Saturday. He is not wearing his dentures at this time. He has an eroded area on his upper R gum which may have been the portal of entry for the infection.  He has an ENT appt today, cardiology next week. He needs to schedule an appt with Dr. Ernestine Conrad.   He is very weak, has a poor appetite, diarrhea since he was started on clindamycin and his weight has gone down another 5 pounds. His furosemide was reduced to 20 mg daily and also his potassium dose was reduced. .  Pt reports he sometimes uses his Symbicort 3 puffs because he has felt that 2 puffs didn't quite get the results he expected.   O:  BP 160/78 mmHg  Pulse 62  Temp(Src) 98.1 F (36.7 C)  Resp 18  Wt 240 lb (108.863 kg)  SpO2 94%       RRR, lungs are clear and no edema.  A:  Resolving cellulitis of the R face       HF - stable       COPD       DM  P:  I will order an albuterol inhaler for pt to use prn for increased SOB or wheezing and a spacer so he can get maximum benefit from his inhalers. Explained that the Symbicort is a daily management inhaler while the albuterol is for prn use to open his airways.  Reinforced disease self management.  Encouraged pt to discuss his complaint of diarrhea with ENT today, if Clindamycin is to be continued, may pick up some immodium and use it prn.  I will call pt on Thursday to follow up.  Deloria Lair [facility] [place] [phone]

## 2014-12-03 NOTE — Telephone Encounter (Signed)
Message printed and given to our scheduler to arrange cardiology appt with Richardson Dopp, PA-c.

## 2014-12-05 NOTE — Progress Notes (Signed)
Cardiology Office Note   Date:  12/06/2014   ID:  [name], [name] 1940/01/14, MRN [id number]   Patient Care Team: Tammi Sou, MD as PCP - General (Family Medicine) Delrae Rend, MD as Consulting Physician (Endocrinology) Inda Castle, MD as Consulting Physician (Gastroenterology) Mcarthur Rossetti, MD as Consulting Physician (Orthopedic Surgery) Jettie Booze, MD as Consulting Physician (Cardiology) Deloria Lair, NP as [facility] (Gerontology) Marshell Garfinkel, MD as Consulting Physician (Pulmonary Disease) Katha Cabal, LCSW as [place] (Licensed Clinical Social Worker)    Chief Complaint  Patient presents with  . Follow-up  . Atrial Fibrillation  . Congestive Heart Failure  . Coronary Artery Disease     History of Present Illness: Joshua [name] is a 74 y.o. male with a hx of pericarditis with pericardial effusion and tamponade physiology status post pericardiocentesis in 6/15, CAD s/p CABG in 8/15 (L-LAD, S-D1, S-OM, S-PDA), PAF, HTN, DM 2, macular degeneration, iron deficiency anemia, COPD, diastolic HF.  CHADS2-VASc=5.  Admitted in 10/16 with AECOPD and a/c diastolic HF.  He was seen in FU by Ermalinda Barrios, PA-C 11/7 and diuretics were increased.  He was placed on Xarelto for anticoagulation.  He was then admitted 11/23-11/26 for facial cellulitis requiring IV antibiotics.   This appointment was made in error and he was not charged for his visit today.   Studies/Reports Reviewed Today:  Echo 10/30/14 Moderate LVH, EF 60-65%, normal wall motion, MAC, moderate MR, mild RAE, PASP 38 mmHg, No effusion  Carotid US 08/09/13 Right: 40-59% ICA, Left: 1-39% ICA stenosis  LHC 8/15 3 vessel CAD >> CABG   Past Medical History  Diagnosis Date  . Hypertension   . Tobacco dependence in remission     100+ pack-yr hx: quit after CABG  . Obesity   . Hyperplastic colon polyp 2001  . Erectile  dysfunction     Normal testosterone  . Adenomatous colon polyp 10/16/2011    Repeat 2018  . 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 pylori neg, no dysplasia or malignancy.  . Macular degeneration, dry     Mild, bilat (Optometrist, Mayford Knife at [facility] in [place], [place])  . Iron deficiency anemia 2014    03/2012 capsule endoscopy showed 2 AVMs--likely responsible for his IDA--lifetime iron supp recommended + q14moCBCs.  . 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 COPD (chronic obstructive pulmonary disease) ([facility]     GOLD II.  Spirometry  2004 borderline obstruction; 2015 mod obst  . Hyperlipidemia   . Pericardial effusion with cardiac tamponade 6//29/15    periocardialcentesis was done,  Infectious/inflamm (cytology showed NO MALIGNANT CELLS)  . Other and unspecified angina pectoris   . DOE (dyspnea on exertion)     COPD + chronic diastolic HF  . Pneumonia   . DM type 2 (diabetes mellitus, type 2) (HCC)     Poor control on max oral meds--pt eventually agreed to insulin therap  . CAD, multiple vessel     3V CAD cath 08/08/13----CABG done shortly after.  . Chronic renal insufficiency, stage II (mild)   . Diabetic nephropathy (HCC)     Elevated urine microalb/cr 03/2011  . Hearing loss of both ears 2016    Hearing aids  . Chronic diastolic heart failure ([facility] [place]    Past Surgical History  Procedure Laterality Date  . Cholecystectomy open  1999  .  Appendectomy  1957  . Hip surgery Right 1954    Repair of slipped capital femoral epiphysis.  . Patella fracture surgery Left ~ 1979    bolt + 3 screws to repair tib plateau fx  . Testicle surgery  as a child    Undescended testicle brought down into scrotum  . Tonsillectomy  1947  . Colonoscopy  10/16/2011    Procedure: COLONOSCOPY;  Surgeon: Inda Castle, MD;  Location: [facility];  Service: Endoscopy;  Laterality: N/A;  . Cataract extraction w/ intraocular lens  implant,  bilateral  04/08/2006 & 04/22/2006  . Esophagogastroduodenoscopy  02/25/12    Atrophic gastritis with a few erosions--capsule endoscopy planned as of 02/25/12 (Dr. Deatra Ina).  . Total hip arthroplasty Right 08/12/2012    Procedure: REMOVAL OF OLD PINS RIGHT HIP AND RIGHT TOTAL HIP ARTHROPLASTY ANTERIOR APPROACH;  Surgeon: Mcarthur Rossetti, MD;  Location: [facility];  Service: Orthopedics;  Laterality: Right;  . Hardware removal Right 08/12/2012    Procedure: HARDWARE REMOVAL;  Surgeon: Mcarthur Rossetti, MD;  Location: [facility];  Service: Orthopedics;  Laterality: Right;  . Cardiac catheterization  08/08/2013  . Cataract extraction w/ intraocular lens  implant, bilateral Bilateral   . Coronary artery bypass graft N/A 08/14/2013    Procedure: CORONARY ARTERY BYPASS GRAFTING (CABG);  Surgeon: Melrose Nakayama, MD;  Location: [facility];  Service: Open Heart Surgery;  Laterality: N/A;  Times 4   using left internal mammary artery and endoscopically harvested bilateral saphenous vein  . Intraoperative transesophageal echocardiogram N/A 08/14/2013    Normal LV function. Procedure: INTRAOPERATIVE TRANSESOPHAGEAL ECHOCARDIOGRAM;  Surgeon: Melrose Nakayama, MD;  Location: [facility];  Service: Open Heart Surgery;  Laterality: N/A;  . Pericardial tap N/A 07/01/2013    Procedure: PERICARDIAL TAP;  Surgeon: Jettie Booze, MD;  Location: [facility] CATH LAB;  Service: Cardiovascular;  Laterality: N/A;  . Left heart catheterization with coronary angiogram N/A 08/08/2013    Procedure: LEFT HEART CATHETERIZATION WITH CORONARY ANGIOGRAM;  Surgeon: Jettie Booze, MD;  Location: [facility] CATH LAB;  Service: Cardiovascular;  Laterality: N/A;  . Transthoracic echocardiogram  10/30/14    Mod LVH, EF 60-65%, normal wall motion, mod mitral regurg, mild PAH     Current Outpatient Prescriptions  Medication Sig Dispense Refill  . ACCU-CHEK AVIVA PLUS test strip     . acetaminophen (TYLENOL) 325 MG tablet Take 2 tablets (650 mg total)  by mouth every 4 (four) hours as needed for headache or mild pain.    [name] albuterol (PROVENTIL HFA;VENTOLIN HFA) 108 (90 BASE) MCG/ACT inhaler Inhale 2 puffs into the lungs every 4 (four) hours as needed for wheezing or shortness of breath. 1 Inhaler 2  . aspirin 81 MG tablet Take 81 mg by mouth at bedtime.     . budesonide-formoterol (SYMBICORT) 160-4.5 MCG/ACT inhaler Inhale 2 puffs into the lungs 2 (two) times daily. 1 Inhaler 12  . buPROPion (ZYBAN) 150 MG 12 hr tablet Take 150 mg by mouth 2 (two) times daily.    . Chromium Picolinate 1000 MCG TABS Take 1,000 mcg by mouth daily.     . citalopram (CELEXA) 40 MG tablet Take 1 tablet (40 mg total) by mouth daily. 90 tablet 3  . clindamycin (CLEOCIN) 300 MG capsule Take 1 capsule (300 mg total) by mouth 4 (four) times daily. 40 capsule 0  . diltiazem (CARDIZEM CD) 240 MG 24 hr capsule Take 1 capsule (240 mg total) by mouth daily. 30 capsule 1  .  furosemide (LASIX) 20 MG tablet Take 1 tablet (20 mg total) by mouth daily. 90 tablet 3  . glimepiride (AMARYL) 4 MG tablet Take 4 mg by mouth 2 (two) times daily.    [name] guaiFENesin (MUCINEX) 600 MG 12 hr tablet Take 2 tablets (1,200 mg total) by mouth 2 (two) times daily. 30 tablet 0  . Insulin Detemir (LEVEMIR FLEXPEN) 100 UNIT/ML Pen 55 U SQ qhs (Patient taking differently: Inject 55 Units into the skin daily at 10 pm. 55 U SQ qhs) 50 mL 1  . insulin regular (NOVOLIN R,HUMULIN R) 100 units/mL injection Inject 8 Units into the skin 3 (three) times daily before meals.    . isosorbide mononitrate (IMDUR) 30 MG 24 hr tablet Take 1 tablet (30 mg total) by mouth daily. 90 tablet 1  . lovastatin (MEVACOR) 40 MG tablet Take 1 tablet (40 mg total) by mouth 2 (two) times daily. 180 tablet 1  . metFORMIN (GLUCOPHAGE) 1000 MG tablet Take 1 tablet (1,000 mg total) by mouth 2 (two) times daily with a meal. 180 tablet 1  . Multiple Vitamin (MULTIVITAMIN WITH MINERALS) TABS Take 1 tablet by mouth daily.    . potassium  chloride (K-DUR) 10 MEQ tablet Take 1 tablet (10 mEq total) by mouth daily. 90 tablet 3  . ramipril (ALTACE) 10 MG capsule Take 10 mg by mouth 2 (two) times daily.     . rivaroxaban (XARELTO) 20 MG TABS tablet Take 1 tablet (20 mg total) by mouth daily with supper. 90 tablet 4  . tamsulosin (FLOMAX) 0.4 MG CAPS capsule TAKE 1 CAPSULE BY MOUTH EVERY DAY AFTER SUPPER 90 capsule 1   No current facility-administered medications for this visit.    Allergies:   Morphine and related; Demerol; and Starlix    Social History:   Social History   Social History  . Marital Status: Married    Spouse Name: [name]  . Number of Children: 4  . Years of Education: N/A   Occupational History  . bus operator    Social History Main Topics  . Smoking status: Former Smoker -- 1.00 packs/day for 62 years    Types: Cigarettes    Quit date: 10/29/2014  . Smokeless tobacco: Former Systems developer    Quit date: 06/30/2013  . Alcohol Use: No  . Drug Use: No  . Sexual Activity: No   Other Topics Concern  . None   Social History Narrative   Married, 4 children.   Formerly a Medical illustrator.   Local transportation bus driver.  Level of education: HS.     +tobacco--lifelong (still smoking as of 08/2011).  No alcohol or drugs.   No exercise.  +excessive caffeine.     Family History:   Family History  Problem Relation Age of Onset  . Breast cancer Sister   . Diabetes Maternal Uncle   . Diabetes Paternal Grandmother   . Heart disease Father   . Heart disease Maternal Uncle   . CVA Mother   . Heart attack Father   . Hypertension Mother   . Stroke Neg Hx       ROS:   Please see the history of present illness.   Review of Systems  Constitution: Positive for malaise/fatigue.  HENT: Positive for hearing loss.   Cardiovascular: Positive for dyspnea on exertion and irregular heartbeat.  Respiratory: Positive for cough and snoring.   Hematologic/Lymphatic: Bruises/bleeds easily.    Psychiatric/Behavioral: Positive for depression.  All other systems reviewed and are  negative.     PHYSICAL EXAM: VS:  BP 124/50 mmHg  Pulse 85  Ht 5' 11.5" (1.816 m)  Wt 242 lb 12.8 oz (110.133 kg)  BMI 33.40 kg/m2    Wt Readings from Last 3 Encounters:  12/06/14 242 lb 12.8 oz (110.133 kg)  12/03/14 240 lb (108.863 kg)  12/01/14 253 lb 4.9 oz (114.9 kg)     GEN: Well nourished, well developed, in no acute distress HEENT: normal Neck: no JVD, no carotid bruits, no masses Cardiac:  Normal S1/S2, RRR; no murmur ,  no rubs or gallops, no edema  Respiratory:  clear to auscultation bilaterally, no wheezing, rhonchi or rales. GI: soft, nontender, nondistended, + BS MS: no deformity or atrophy Skin: warm and dry  Neuro:  CNs II-XII intact, Strength and sensation are intact Psych: Normal affect   EKG:  EKG is ordered today.  It demonstrates:   NSR, HR 85, left axis deviation, QTc 456 ms, similar to previous tracings   Recent Labs: 10/29/2014: B Natriuretic Peptide 402.0* 11/30/2014: ALT 21; BUN 25*; Creatinine, Ser 1.21; Potassium 4.4; Sodium 133* 12/01/2014: Hemoglobin 10.6*; Platelets 188    Lipid Panel    Component Value Date/Time   CHOL 161 04/17/2014 0826   TRIG 218.0* 04/17/2014 0826   HDL 47.60 04/17/2014 0826   CHOLHDL 3 04/17/2014 0826   VLDL 43.6* 04/17/2014 0826   LDLCALC 63 07/04/2013 0325   LDLDIRECT 77.0 04/17/2014 0826      ASSESSMENT AND PLAN:  1. CAD:  S/p CABG in 8/15.    2. Chronic Diastolic CHF:    3. HTN:    4. Hyperlipidemia:    5. PAF:    6. Hx of Pericarditis:  With effusion and tamponade s/p tap in 6/15.       Medication Changes: Current medicines are reviewed at length with the patient today.  Concerns regarding medicines are as outlined above.  The following changes have been made:   Discontinued Medications   No medications on file   Modified Medications   No medications on file   New Prescriptions   No medications  on file   Labs/ tests ordered today include:   No orders of the defined types were placed in this encounter.     Disposition:    FU with     Signed, Versie Starks, MHS 12/06/2014 11:54 AM    [facility] [address], [place], [place]  [address] Phone: [phone]; Fax: [phone]    This encounter was created in error - please disregard.

## 2014-12-06 ENCOUNTER — Encounter: Payer: Self-pay | Admitting: Physician Assistant

## 2014-12-06 ENCOUNTER — Ambulatory Visit: Payer: Medicare Other | Admitting: *Deleted

## 2014-12-06 ENCOUNTER — Encounter: Payer: Medicare Other | Admitting: Physician Assistant

## 2014-12-06 VITALS — BP 124/50 | HR 85 | Ht 71.5 in | Wt 242.8 lb

## 2014-12-06 DIAGNOSIS — I4891 Unspecified atrial fibrillation: Secondary | ICD-10-CM

## 2014-12-06 NOTE — Patient Instructions (Signed)
WE WILL REFUND YOUR CREDIT CARD USED TODAY FOR YOUR CO-PAY; PER SCOTT WEAVER, PAC NO CHARGE FOR YOUR VISIT TODAY

## 2014-12-10 ENCOUNTER — Other Ambulatory Visit: Payer: Self-pay | Admitting: *Deleted

## 2014-12-10 DIAGNOSIS — L03211 Cellulitis of face: Secondary | ICD-10-CM | POA: Diagnosis not present

## 2014-12-10 NOTE — Patient Outreach (Signed)
Transition of care call Monday 12/10/14 week #2:  Mrs. Mckelvin reports pt is doing OK. He still cannot get his dentures in but he is eating adequately soft foods. He will see the ENT specialist this afternoon.  Pt is without CP, SOB or edema. He weight is stable at 236-7.  I have encouraged the warm salt water mouth swish. Take dentures to appt today. Continue the antibiotics. Call me if any problems. I will call again later in the week and see him next week.  Deloria Lair [facility] [place] [phone]

## 2014-12-10 NOTE — Patient Outreach (Signed)
Transiton of care: Pt has had his follow up visit. He was instructed to continue the clindamycin and take immodium for loose stools. His facial swelling continues to dwindle and his irritated upper gum remains very sore. He is not trying to wear his dentures and is eating soft foods.  Instructed to follow MD recommendations. May try warm salt water mouth rinse several times a day. Make sure to spit the rinse out and not swallow to prevent fluid wt gain.  Call me if any questions or problems arise. I will call Monday to ensure pt continues to improve.  Deloria Lair [facility] [place] [phone]

## 2014-12-11 ENCOUNTER — Other Ambulatory Visit: Payer: Self-pay | Admitting: Licensed Clinical Social Worker

## 2014-12-11 NOTE — Patient Outreach (Signed)
Assessment:  CSW called home phone number of client on 12/11/14.  CSW did not speak with client on 12/11/14.  CSW spoke via phone with Erma Pinto, spouse of client, on 12/11/14.  She said client uses inhaler as prescribed to help with breathing.  She said client has prescribed medications and is taking medications as prescribed. CSW and [name] spoke of client  uses of relaxation techniques: taking a nap, or watching favorate TV westerns as ways to help him manage depression.. She said client is still using these techniques to help him manage depression symptoms.  She said client has financial challenges as well.. She said client has difficulty paying monthly bills.   For example, she said it is difficult for client to pay for client monthly cost for prescribed dosage of Xarelto. [name] said client had talked with Dr. Anitra Lauth, client's primary doctor, about medication cost of Xarelto.  She said Dr. Anitra Lauth and client have talked about client possibly using Warfarin instead of Xarelto. Dr. Anitra Lauth informed client previously that if client switched to using Warfarin that client would have to have more routine blood checks.  Client has appointment with Dr. Anitra Lauth on 12/17/14 and plans to talk more with Dr. Anitra Lauth at appointment about medication costs for client and possibly switching to Warfarin for client.    [name] said client takes prescribed medication for depression. She said client is sleeping well and eating well.  She said client drives himself to scheduled medical appointments.  She said he uses a cane to ambulate.  She said he has appointment with cardiologist on 01/14/2015. CSW offered to provide counseling support for client.  [name] said she understood this information.  CSW encouraged client or [name] to call CSW at [phone] as needed to further discuss social work needs of client.  CSW thanked Dquan Cortopassi for phone conversation with CSW on 12/11/14.   Plan: Client to continue to use relaxation  techniques to help client manage depression symptoms. CSW to call client/Jewell Blasingame in 4 weeks to assess client needs.  [name].Kelechi Astarita MSW, LCSW Licensed Clinical Social Worker [facility] [phone]

## 2014-12-14 ENCOUNTER — Other Ambulatory Visit: Payer: Self-pay | Admitting: *Deleted

## 2014-12-14 NOTE — Patient Outreach (Signed)
Transition of care call week #2 second call this week. Mrs. Ang reports pt is stable. He is weighing daily, following low sodium diet, taking his meds. He has finished his antibiotic. His R cheek has less swelling. He did see the ENT and he thought his condition was improving. Pt still cannot get his dentures in. Dr. Palma Holter said it may take another couple of weeks for the swelling to disappear. WIfe reports she feels like pt has given up and doesn not exhibit any motivation to get moving. He eats and sleeps.  We discussed getting PT to help gain some strength. I will discuss this with him when I see him on Monday.  Encouraged them to call me or nurse line for any problems.  Deloria Lair [facility] [phone]

## 2014-12-17 ENCOUNTER — Encounter: Payer: Self-pay | Admitting: Family Medicine

## 2014-12-17 ENCOUNTER — Other Ambulatory Visit: Payer: Self-pay | Admitting: *Deleted

## 2014-12-17 ENCOUNTER — Ambulatory Visit (INDEPENDENT_AMBULATORY_CARE_PROVIDER_SITE_OTHER): Payer: Medicare Other | Admitting: Family Medicine

## 2014-12-17 VITALS — BP 124/71 | Ht 71.65 in | Wt 239.0 lb

## 2014-12-17 DIAGNOSIS — I5032 Chronic diastolic (congestive) heart failure: Secondary | ICD-10-CM | POA: Diagnosis not present

## 2014-12-17 DIAGNOSIS — I480 Paroxysmal atrial fibrillation: Secondary | ICD-10-CM

## 2014-12-17 DIAGNOSIS — L03211 Cellulitis of face: Secondary | ICD-10-CM | POA: Diagnosis not present

## 2014-12-17 DIAGNOSIS — E118 Type 2 diabetes mellitus with unspecified complications: Secondary | ICD-10-CM

## 2014-12-17 LAB — MICROALBUMIN / CREATININE URINE RATIO
Creatinine,U: 72.3 mg/dL
MICROALB/CREAT RATIO: 6.4 mg/g (ref 0.0–30.0)
Microalb, Ur: 4.6 mg/dL — ABNORMAL HIGH (ref 0.0–1.9)

## 2014-12-17 MED ORDER — INSULIN REGULAR HUMAN 100 UNIT/ML IJ SOLN: INTRAMUSCULAR | Status: DC

## 2014-12-17 NOTE — Progress Notes (Signed)
OFFICE VISIT  12/17/2014   CC: No chief complaint on file.    HPI:    Patient is a 74 y.o. Caucasian male who presents for 1 mo f/u DM 2, chronic diastolic CHF, and PAF currently on xarelto.  Since I last saw him he had a R sided facial cellulitis that has almost completely resolved on clindamycin, and he cannot wear his upper teeth plate.  Breathing feels good. Compliant with levemir 55 U qhs and 8 U humulin R at each meal. Fasting glucoses avg around 100, several <70.   BPs wnl, no signif highs or lows, HR avg 80. Wt over last 2 wks ranged from 235 to 239. Oxygen sat 92-97% on RA.  Has about 6 wks of samples of xarelto.  Past Medical History  Diagnosis Date  . Hypertension   . Tobacco dependence in remission     100+ pack-yr hx: quit after CABG  . Obesity   . Hyperplastic colon polyp 2001  . Erectile dysfunction     Normal testosterone  . Adenomatous colon polyp 10/16/2011    Repeat 2018  . 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 pylori neg, no dysplasia or malignancy.  . Macular degeneration, dry     Mild, bilat (Optometrist, Mayford Knife at [employer] [facility] [place] in [place], [place])  . Iron deficiency anemia 2014    03/2012 capsule endoscopy showed 2 AVMs--likely responsible for his IDA--lifetime iron supp recommended + q54moCBCs.  . 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 COPD (chronic obstructive pulmonary disease) ([facility]     GOLD II.  Spirometry  2004 borderline obstruction; 2015 mod obst  . Hyperlipidemia   . Pericardial effusion with cardiac tamponade 6//29/15    periocardialcentesis was done,  Infectious/inflamm (cytology showed NO MALIGNANT CELLS)  . Other and unspecified angina pectoris   . DOE (dyspnea on exertion)     COPD + chronic diastolic HF  . Pneumonia   . DM type 2 (diabetes mellitus, type 2) (HCC)     Poor control on max oral meds--pt eventually agreed to insulin therap  . CAD, multiple vessel     3V CAD cath  08/08/13----CABG done shortly after.  . Chronic renal insufficiency, stage II (mild)   . Diabetic nephropathy (HCC)     Elevated urine microalb/cr 03/2011  . Hearing loss of both ears 2016    Hearing aids  . Chronic diastolic heart failure ([facility] 2016    Past Surgical History  Procedure Laterality Date  . Cholecystectomy open  1999  . Appendectomy  1957  . Hip surgery Right 1954    Repair of slipped capital femoral epiphysis.  . Patella fracture surgery Left ~ 1979    bolt + 3 screws to repair tib plateau fx  . Testicle surgery  as a child    Undescended testicle brought down into scrotum  . Tonsillectomy  1947  . Colonoscopy  10/16/2011    Procedure: COLONOSCOPY;  Surgeon: RInda Castle MD;  Location: [facility];  Service: Endoscopy;  Laterality: N/A;  . Cataract extraction w/ intraocular lens  implant, bilateral  04/08/2006 & 04/22/2006  . Esophagogastroduodenoscopy  02/25/12    Atrophic gastritis with a few erosions--capsule endoscopy planned as of 02/25/12 (Dr. KDeatra Ina.  . Total hip arthroplasty Right 08/12/2012    Procedure: REMOVAL OF OLD PINS RIGHT HIP AND RIGHT TOTAL HIP ARTHROPLASTY ANTERIOR APPROACH;  Surgeon: CMcarthur Rossetti MD;  [facility];  Service: Orthopedics;  Laterality: Right;  . Hardware removal Right 08/12/2012    Procedure: HARDWARE REMOVAL;  Surgeon: Mcarthur Rossetti, MD;  Location: [facility];  Service: Orthopedics;  Laterality: Right;  . Cardiac catheterization  08/08/2013  . Cataract extraction w/ intraocular lens  implant, bilateral Bilateral   . Coronary artery bypass graft N/A 08/14/2013    Procedure: CORONARY ARTERY BYPASS GRAFTING (CABG);  Surgeon: Melrose Nakayama, MD;  Location: [place] [facility];  Service: Open Heart Surgery;  Laterality: N/A;  Times 4   using left internal mammary artery and endoscopically harvested bilateral saphenous vein  . Intraoperative transesophageal echocardiogram N/A 08/14/2013    Normal LV function. Procedure: INTRAOPERATIVE  TRANSESOPHAGEAL ECHOCARDIOGRAM;  Surgeon: Melrose Nakayama, MD;  Location: [place];  Service: Open Heart Surgery;  Laterality: N/A;  . Pericardial tap N/A 07/01/2013    Procedure: PERICARDIAL TAP;  Surgeon: Jettie Booze, MD;  Location: [facility] CATH LAB;  Service: Cardiovascular;  Laterality: N/A;  . Left heart catheterization with coronary angiogram N/A 08/08/2013    Procedure: LEFT HEART CATHETERIZATION WITH CORONARY ANGIOGRAM;  Surgeon: Jettie Booze, MD;  Location: [facility] CATH LAB;  Service: Cardiovascular;  Laterality: N/A;  . Transthoracic echocardiogram  10/30/14    Mod LVH, EF 60-65%, normal wall motion, mod mitral regurg, mild PAH    Outpatient Prescriptions Prior to Visit  Medication Sig Dispense Refill  . ACCU-CHEK AVIVA PLUS test strip     . acetaminophen (TYLENOL) 325 MG tablet Take 2 tablets (650 mg total) by mouth every 4 (four) hours as needed for headache or mild pain.    [name] albuterol (PROVENTIL HFA;VENTOLIN HFA) 108 (90 BASE) MCG/ACT inhaler Inhale 2 puffs into the lungs every 4 (four) hours as needed for wheezing or shortness of breath. 1 Inhaler 2  . aspirin 81 MG tablet Take 81 mg by mouth at bedtime.     . budesonide-formoterol (SYMBICORT) 160-4.5 MCG/ACT inhaler Inhale 2 puffs into the lungs 2 (two) times daily. 1 Inhaler 12  . buPROPion (ZYBAN) 150 MG 12 hr tablet Take 150 mg by mouth 2 (two) times daily.    . Chromium Picolinate 1000 MCG TABS Take 1,000 mcg by mouth daily.     . citalopram (CELEXA) 40 MG tablet Take 1 tablet (40 mg total) by mouth daily. 90 tablet 3  . diltiazem (CARDIZEM CD) 240 MG 24 hr capsule Take 1 capsule (240 mg total) by mouth daily. 30 capsule 1  . furosemide (LASIX) 20 MG tablet Take 1 tablet (20 mg total) by mouth daily. 90 tablet 3  . glimepiride (AMARYL) 4 MG tablet Take 4 mg by mouth 2 (two) times daily.    [name] guaiFENesin (MUCINEX) 600 MG 12 hr tablet Take 2 tablets (1,200 mg total) by mouth 2 (two) times daily. 30 tablet 0  . Insulin  Detemir (LEVEMIR FLEXPEN) 100 UNIT/ML Pen 55 U SQ qhs (Patient taking differently: Inject 55 Units into the skin daily at 10 pm. 55 U SQ qhs) 50 mL 1  . isosorbide mononitrate (IMDUR) 30 MG 24 hr tablet Take 1 tablet (30 mg total) by mouth daily. 90 tablet 1  . lovastatin (MEVACOR) 40 MG tablet Take 1 tablet (40 mg total) by mouth 2 (two) times daily. 180 tablet 1  . metFORMIN (GLUCOPHAGE) 1000 MG tablet Take 1 tablet (1,000 mg total) by mouth 2 (two) times daily with a meal. 180 tablet 1  . Multiple Vitamin (MULTIVITAMIN WITH MINERALS) TABS Take 1 tablet by mouth  daily.    . potassium chloride (K-DUR) 10 MEQ tablet Take 1 tablet (10 mEq total) by mouth daily. 90 tablet 3  . ramipril (ALTACE) 10 MG capsule Take 10 mg by mouth 2 (two) times daily.     . rivaroxaban (XARELTO) 20 MG TABS tablet Take 1 tablet (20 mg total) by mouth daily with supper. 90 tablet 4  . tamsulosin (FLOMAX) 0.4 MG CAPS capsule TAKE 1 CAPSULE BY MOUTH EVERY DAY AFTER SUPPER 90 capsule 1  . insulin regular (NOVOLIN R,HUMULIN R) 100 units/mL injection Inject 8 Units into the skin 3 (three) times daily before meals.    . clindamycin (CLEOCIN) 300 MG capsule Take 1 capsule (300 mg total) by mouth 4 (four) times daily. (Patient not taking: Reported on 12/17/2014) 40 capsule 0   No facility-administered medications prior to visit.    Allergies  Allergen Reactions  . Morphine And Related Other (See Comments)    Drenched with perspiration  . Demerol [Meperidine] Nausea Only  . Starlix [Nateglinide] Other (See Comments)    gassy    ROS As per HPI  PE: Blood pressure 124/71, height 5' 11.65" (1.82 m), weight 239 lb (108.41 kg). Gen: alert, oriented x 4, pleasant, lucid thought and speech. ENT: no facial swelling or erythema or tenderness or nodule/fluctuance.  Inside R upper lip in buccal mucosa he has a small amount of erythema w/out tenderness. CV: RRR, distant S1 and S2, without audible m/r/g LUNGS: CTA bilat,  nonlabored resps. EXT: no clubbing or cyanosis, with trace pitting edema bilat in pretibial regions  LABS:  Lab Results  Component Value Date   HGBA1C 7.7* 10/29/2014     Chemistry      Component Value Date/Time   NA 133* 11/30/2014 0441   K 4.4 11/30/2014 0441   CL 104 11/30/2014 0441   CO2 17* 11/30/2014 0441   BUN 25* 11/30/2014 0441   CREATININE 1.21 11/30/2014 0441   CREATININE 1.03 11/26/2014 1145      Component Value Date/Time   CALCIUM 8.6* 11/30/2014 0441   ALKPHOS 60 11/30/2014 0441   AST 20 11/30/2014 0441   ALT 21 11/30/2014 0441   BILITOT 0.5 11/30/2014 0441     Lab Results  Component Value Date   WBC 7.3 12/01/2014   HGB 10.6* 12/01/2014   HCT 33.1* 12/01/2014   MCV 83.0 12/01/2014   PLT 188 12/01/2014     IMPRESSION AND PLAN:  1) DM 2, some hypoglycemia in fasting state. Will continue all current insulin dosing EXCEPT will decrease his supper insulin to 6 Units of Humulin R (down from 8 U). Urine microalb/cr today. Next HbA1c not due until late Jan 2017.  2) Chronic diastolic CHF: no sign of volume overload, 239 lbs seems to be a pretty good wt for him. Continue lasix 73m qd, low Na diet, wt monitoring.  3) PAF; seems to be in sinus rhythm currently.  Rate well controlled on cardizem CD 240 qd per home monitoring. Currently pt states he wants to continue xarelto until he runs out of samples, which he says will be approx 6 wks, plus he says he has cardiology f/u 01/13/14.  He'll either get more samples of xarelto or he'll either me or Dr. VIrish Lackto switch him over to coumadin (which will require a lovenox bridge).  4) Facial cellulitis, right side.  Nearly completely resolved on abx. He will continue to leave his upper teeth out until this has completely resolved.  An After Visit Summary  was printed and given to the patient.  FOLLOW UP: Return in about 2 months (around 02/17/2015) for routine chronic illness f/u.

## 2014-12-17 NOTE — Patient Instructions (Signed)
Decrease your supper time insulin dose to 6 UNITS. Otherwise, continue all current med dosing.

## 2014-12-18 NOTE — Patient Outreach (Signed)
[facility]) Care Management  12/18/2014  Joshua [name] [birth year] [id number]   S:  Pt is improving. He saw Dr. Ernestine Conrad this morning. His insulin was adjusted. His glucose readings have improved. Facial swelling has resolved. Conjunctival hemorrhage still visible (Right). Pt requests foot care. Weight is stable. No SOB, CP or edema.  O:  BP 132/64 mmHg  Pulse 76  Wt 239 lb (108.41 kg)  SpO2 96% FBS: 77       RRR       Lungs are clear       No edema       Long onychomycotic toenails  A:  Facial cellulitis resolving       HF - stable       DM - improved control  P:  Reinforced disease management       Debrided toenails       I will call pt next week for his 4th transition of care contact and see him in a month.

## 2014-12-25 ENCOUNTER — Other Ambulatory Visit: Payer: Self-pay | Admitting: *Deleted

## 2014-12-25 NOTE — Patient Outreach (Signed)
Last transition of care call. Pt states he feels better every day. He still cannot get his denture in. He is weighing daily and wt is stable. Follows low salt diet. Takes his meds as ordered. Is trying to increase his activity daily.  I have advised him to call his MD if any problems occur in the next 2 weeks and that I will call him the first week of January to follow up. I will see him later in January.  Deloria Lair [facility] [phone]

## 2015-01-02 ENCOUNTER — Encounter: Payer: Self-pay | Admitting: Family Medicine

## 2015-01-02 ENCOUNTER — Ambulatory Visit (INDEPENDENT_AMBULATORY_CARE_PROVIDER_SITE_OTHER): Payer: Medicare Other | Admitting: Family Medicine

## 2015-01-02 VITALS — BP 109/67 | HR 76 | Temp 97.9°F | Resp 16 | Ht 71.65 in | Wt 247.5 lb

## 2015-01-02 DIAGNOSIS — S93402A Sprain of unspecified ligament of left ankle, initial encounter: Secondary | ICD-10-CM

## 2015-01-02 MED ORDER — HYDROCODONE-ACETAMINOPHEN 5-325 MG PO TABS: 1.0000 | ORAL_TABLET | Freq: Four times a day (QID) | ORAL | Status: DC | PRN

## 2015-01-02 NOTE — Progress Notes (Signed)
Pre visit review using our clinic review tool, if applicable. No additional management support is needed unless otherwise documented below in the visit note.

## 2015-01-02 NOTE — Progress Notes (Signed)
OFFICE NOTE  01/02/2015  CC:  Chief Complaint  Patient presents with  . Ankle Pain    Left ankle x 1 day, stepped off curb wrong   HPI: Patient is a 74 y.o. Caucasian male who is here for L ankle pain after inversion injury when he stepped off a curb yesterday.  He did not fall.  He could walk/bear wt after and it had minimal swelling last night which improved overnight.  Hurts like a throbbing tooth ache.  Took two 500 mg tylenol last night and this helped some.  Walks with a cane when inside as per his usual.  Pertinent PMH:  Past medical, surgical, social, and family history reviewed and no changes are noted since last office visit.  MEDS:  Outpatient Prescriptions Prior to Visit  Medication Sig Dispense Refill  . ACCU-CHEK AVIVA PLUS test strip     . acetaminophen (TYLENOL) 325 MG tablet Take 2 tablets (650 mg total) by mouth every 4 (four) hours as needed for headache or mild pain.    [name] albuterol (PROVENTIL HFA;VENTOLIN HFA) 108 (90 BASE) MCG/ACT inhaler Inhale 2 puffs into the lungs every 4 (four) hours as needed for wheezing or shortness of breath. 1 Inhaler 2  . aspirin 81 MG tablet Take 81 mg by mouth at bedtime.     . budesonide-formoterol (SYMBICORT) 160-4.5 MCG/ACT inhaler Inhale 2 puffs into the lungs 2 (two) times daily. 1 Inhaler 12  . buPROPion (ZYBAN) 150 MG 12 hr tablet Take 150 mg by mouth 2 (two) times daily.    . Chromium Picolinate 1000 MCG TABS Take 1,000 mcg by mouth daily.     . citalopram (CELEXA) 40 MG tablet Take 1 tablet (40 mg total) by mouth daily. 90 tablet 3  . diltiazem (CARDIZEM CD) 240 MG 24 hr capsule Take 1 capsule (240 mg total) by mouth daily. 30 capsule 1  . furosemide (LASIX) 20 MG tablet Take 1 tablet (20 mg total) by mouth daily. 90 tablet 3  . glimepiride (AMARYL) 4 MG tablet Take 4 mg by mouth 2 (two) times daily.    [name] guaiFENesin (MUCINEX) 600 MG 12 hr tablet Take 2 tablets (1,200 mg total) by mouth 2 (two) times daily. 30 tablet 0  .  Insulin Detemir (LEVEMIR FLEXPEN) 100 UNIT/ML Pen 55 U SQ qhs (Patient taking differently: Inject 55 Units into the skin daily at 10 pm. 55 U SQ qhs) 50 mL 1  . insulin regular (NOVOLIN R,HUMULIN R) 100 units/mL injection 8 Units SQ with BF and Lunch, 6 Units SQ with Supper 10 mL 6  . isosorbide mononitrate (IMDUR) 30 MG 24 hr tablet Take 1 tablet (30 mg total) by mouth daily. 90 tablet 1  . lovastatin (MEVACOR) 40 MG tablet Take 1 tablet (40 mg total) by mouth 2 (two) times daily. 180 tablet 1  . metFORMIN (GLUCOPHAGE) 1000 MG tablet Take 1 tablet (1,000 mg total) by mouth 2 (two) times daily with a meal. 180 tablet 1  . Multiple Vitamin (MULTIVITAMIN WITH MINERALS) TABS Take 1 tablet by mouth daily.    . potassium chloride (K-DUR) 10 MEQ tablet Take 1 tablet (10 mEq total) by mouth daily. 90 tablet 3  . potassium chloride SA (K-DUR,KLOR-CON) 20 MEQ tablet     . ramipril (ALTACE) 10 MG capsule Take 10 mg by mouth 2 (two) times daily.     . rivaroxaban (XARELTO) 20 MG TABS tablet Take 1 tablet (20 mg total) by mouth daily with supper. [place]  tablet 4  . tamsulosin (FLOMAX) 0.4 MG CAPS capsule TAKE 1 CAPSULE BY MOUTH EVERY DAY AFTER SUPPER 90 capsule 1   No facility-administered medications prior to visit.    PE: Blood pressure 109/67, pulse 76, temperature 97.9 F (36.6 C), temperature source Oral, resp. rate 16, height 5' 11.65" (1.82 m), weight 247 lb 8 oz (112.265 kg), SpO2 96 %. Gen: Alert, well appearing.  Patient is oriented to person, place, time, and situation. Lower legs: 1+ pitting edema from knees into feet bilat. L ankle:  with slight warmth but no erythema.  Mild peri-articular soft tissue swelling, laterally > medially. Mild TTP on proximal anterolateral aspect of L ankle, w/out any bony tenderness.  No achilles tendon swelling or tenderness.  ROM of ankle intact actively and passively.  Anterior drawer and talar tilt show no excess laxity. Mild increased pain with talar tilt.     IMPRESSION AND PLAN:  Ankle sprain, initial encounter. No indication for x-rays. Ankle lace up support fitted/dispensed to pt today. Recommended ice 20 min qd minimum over the next few days, elevate minimum of 20 min daily. No NSAIDs. Tylenol is fine, plus I gave him a small number (#15) of vicodin 5/325 to take 1 q6h prn moderate pain.  He has tolerated this med well in the past w/out any impairment.   Home ankle rehab exercises reviewed with pt and his wife today.  An After Visit Summary was printed and given to the patient.  FOLLOW UP: prn

## 2015-01-04 ENCOUNTER — Other Ambulatory Visit: Payer: Self-pay | Admitting: Family Medicine

## 2015-01-04 NOTE — Telephone Encounter (Signed)
LOV: 12/17/14 NOV: 02/18/15  RF request for glimpizide Last written: 03/28/14 #180 w/ 1RF  RF request for ramipril Last written: 05/24/14 #180 w/ 1RF  RF request for isosorbide Last written: 05/24/14 #90 w/ 1RF  RF request for levemir Last written: 11/19/14 86m w/ 1RF  RF request for tamsulosin Last written: 05/24/14 #90 w/ 1RF

## 2015-01-08 ENCOUNTER — Other Ambulatory Visit: Payer: Self-pay | Admitting: *Deleted

## 2015-01-08 DIAGNOSIS — H353132 Nonexudative age-related macular degeneration, bilateral, intermediate dry stage: Secondary | ICD-10-CM | POA: Diagnosis not present

## 2015-01-08 DIAGNOSIS — H1133 Conjunctival hemorrhage, bilateral: Secondary | ICD-10-CM | POA: Diagnosis not present

## 2015-01-08 DIAGNOSIS — E119 Type 2 diabetes mellitus without complications: Secondary | ICD-10-CM | POA: Diagnosis not present

## 2015-01-10 ENCOUNTER — Other Ambulatory Visit: Payer: Self-pay | Admitting: Licensed Clinical Social Worker

## 2015-01-10 NOTE — Patient Outreach (Signed)
Assessment:  CSW called client home phone number on 01/10/15 and spoke via phone with Joshua [name], spouse of client. CSW verified identity of Joshua [name]. Joshua [name] and CSW spoke of client needs. Joshua [name] said client had attended scheduled client medical appointments. Joshua [name] said that client drives himself to his scheduled medical appointments.  She said client has all his  prescribed medications. Client is taking prescribed medications. CSW and Joshua [name] spoke of client care plan. She said client is using relaxation techniques to help him manage depression symptoms. She said client watches TV westerns, takes a nap or goes out for coffee with a friend weekly for relaxation. CSW encouraged Joshua [name] to encourage client to continue using relaxation techniques to help client manage depression symptoms  She said client stays in bed often during day. She said client gets up for meals daily and for periods of time but stays in bed a good bit of time during the day. She said client naps while in bed during the day.  She said he has adequate appetite and has adequate food supply.  She said client has decreased motivation.  She said he has reduced social contact generally; but, client does go out with a friend weekly for breakfast or to have coffee and socialize.. She said client did not report any pain issues.  Client uses a  cane to walk.  Joshua [name] reported that client is sad because he cannot work at present. 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  She said client has appointment with cardiologist on 01/14/15.  She said client will continue to try to attend scheduled client medical appointments.  CSW encouraged Joshua [name] or client to contact CSW as needed for CSW support at [phone].  CSW thanked Joshua [name] for phone call with CSW on 01/10/15.   Plan: Client to continue to use relaxation techniques to help client manage depression symptoms. CSW to call client in 4 weeks to assess client  needs.  Joshua [name].Joshua [name] MSW, LCSW Licensed Clinical Social Worker [facility] Care Management [phone]

## 2015-01-14 ENCOUNTER — Other Ambulatory Visit: Payer: Self-pay | Admitting: *Deleted

## 2015-01-14 ENCOUNTER — Ambulatory Visit: Payer: Medicare Other | Admitting: *Deleted

## 2015-01-14 ENCOUNTER — Ambulatory Visit: Payer: Self-pay | Admitting: *Deleted

## 2015-01-14 ENCOUNTER — Ambulatory Visit (INDEPENDENT_AMBULATORY_CARE_PROVIDER_SITE_OTHER): Payer: Medicare Other | Admitting: Interventional Cardiology

## 2015-01-14 ENCOUNTER — Encounter: Payer: Self-pay | Admitting: Interventional Cardiology

## 2015-01-14 VITALS — BP 125/60 | HR 80 | Ht 72.0 in | Wt 247.0 lb

## 2015-01-14 DIAGNOSIS — I480 Paroxysmal atrial fibrillation: Secondary | ICD-10-CM | POA: Diagnosis not present

## 2015-01-14 DIAGNOSIS — Z951 Presence of aortocoronary bypass graft: Secondary | ICD-10-CM

## 2015-01-14 DIAGNOSIS — I25810 Atherosclerosis of coronary artery bypass graft(s) without angina pectoris: Secondary | ICD-10-CM | POA: Diagnosis not present

## 2015-01-14 DIAGNOSIS — E785 Hyperlipidemia, unspecified: Secondary | ICD-10-CM | POA: Diagnosis not present

## 2015-01-14 DIAGNOSIS — I10 Essential (primary) hypertension: Secondary | ICD-10-CM

## 2015-01-14 NOTE — Progress Notes (Signed)
Patient ID: Joshua [name], male   DOB: 10-11-1940, 75 y.o.   MRN: [id number]     Cardiology Office Note   Date:  01/14/2015   ID:  Joshua [name] 27-Jan-1940, MRN [id number]  PCP:  Tammi Sou, MD    No chief complaint on file. f/u CAD   Wt Readings from Last 3 Encounters:  01/14/15 247 lb (112.038 kg)  01/02/15 247 lb 8 oz (112.265 kg)  12/17/14 239 lb (108.41 kg)       History of Present Illness: Joshua [name] is a 75 y.o. male  who has had pericarditis in June 2015. He underwent cardiac cath after this time and had three-vessel coronary artery disease in August 2015. He underwent bypass surgery at that time. He recovered well he denies any chest discomfort. He feels that his energy level is low.  SL NTG has not been used.  His exercise is limited by [facility].  He quit smoking fully in October 2016.  He does not walk very much. His wife states that he spends much of his time laying in bed. He has gained weight since his surgery. At the time of his bypass, he was 203 pounds. He eats frequently during the day. He eats 3 full meals and 3 snacks. He eats late at night. He does not eat a lot of sweets. He denies eating a lot of starchy foods. He tries to have only wheat bread. He stays away from sweets but he does eat a lot of canned peaches.  He has been hospitalized several times in the past year. He had facial cellulitis and was evaluated by ENT. At one point in October 2016, he had paroxysmal atrial fibrillation and was started on anticoagulation.      Past Medical History  Diagnosis Date  . Hypertension   . Tobacco dependence in remission     100+ pack-yr hx: quit after CABG  . Obesity   . Hyperplastic colon polyp 2001  . Erectile dysfunction     Normal testosterone  . Adenomatous colon polyp 10/16/2011    Repeat 2018  . 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 pylori neg, no dysplasia or malignancy.  . Macular degeneration, dry       Mild, bilat (Optometrist, Mayford Knife at [facility] in [place], [place])  . Iron deficiency anemia 2014    03/2012 capsule endoscopy showed 2 AVMs--likely responsible for his IDA--lifetime iron supp recommended + q25moCBCs.  . 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 COPD (chronic obstructive pulmonary disease) ([place]     GOLD II.  Spirometry  2004 borderline obstruction; 2015 mod obst  . Hyperlipidemia   . Pericardial effusion with cardiac tamponade 6//29/15    periocardialcentesis was done,  Infectious/inflamm (cytology showed NO MALIGNANT CELLS)  . Other and unspecified angina pectoris   . DOE (dyspnea on exertion)     COPD + chronic diastolic HF  . Pneumonia   . DM type 2 (diabetes mellitus, type 2) (HCC)     Poor control on max oral meds--pt eventually agreed to insulin therap  . CAD, multiple vessel     3V CAD cath 08/08/13----CABG done shortly after.  . Chronic renal insufficiency, stage II (mild)   . Diabetic nephropathy (HCC)     Elevated urine microalb/cr 03/2011  . Hearing loss of both ears 2016    Hearing aids  . Chronic diastolic heart failure ([place]  [place]    Past Surgical History  Procedure Laterality Date  . Cholecystectomy open  1999  . Appendectomy  1957  . Hip surgery Right 1954    Repair of slipped capital femoral epiphysis.  . Patella fracture surgery Left ~ 1979    bolt + 3 screws to repair tib plateau fx  . Testicle surgery  as a child    Undescended testicle brought down into scrotum  . Tonsillectomy  1947  . Colonoscopy  10/16/2011    Procedure: COLONOSCOPY;  Surgeon: Inda Castle, MD;  Location: [facility];  Service: Endoscopy;  Laterality: N/A;  . Cataract extraction w/ intraocular lens  implant, bilateral  04/08/2006 & 04/22/2006  . Esophagogastroduodenoscopy  02/25/12    Atrophic gastritis with a few erosions--capsule endoscopy planned as of 02/25/12 (Dr. Deatra Ina).  . Total hip arthroplasty Right 08/12/2012    Procedure: REMOVAL OF OLD PINS RIGHT  HIP AND RIGHT TOTAL HIP ARTHROPLASTY ANTERIOR APPROACH;  Surgeon: Mcarthur Rossetti, MD;  Location: [facility];  Service: Orthopedics;  Laterality: Right;  . Hardware removal Right 08/12/2012    Procedure: HARDWARE REMOVAL;  Surgeon: Mcarthur Rossetti, MD;  Location: [facility];  Service: Orthopedics;  Laterality: Right;  . Cardiac catheterization  08/08/2013  . Cataract extraction w/ intraocular lens  implant, bilateral Bilateral   . Coronary artery bypass graft N/A 08/14/2013    Procedure: CORONARY ARTERY BYPASS GRAFTING (CABG);  Surgeon: Melrose Nakayama, MD;  Location: [facility];  Service: Open Heart Surgery;  Laterality: N/A;  Times 4   using left internal mammary artery and endoscopically harvested bilateral saphenous vein  . Intraoperative transesophageal echocardiogram N/A 08/14/2013    Normal LV function. Procedure: INTRAOPERATIVE TRANSESOPHAGEAL ECHOCARDIOGRAM;  Surgeon: Melrose Nakayama, MD;  Location: [facility];  Service: Open Heart Surgery;  Laterality: N/A;  . Pericardial tap N/A 07/01/2013    Procedure: PERICARDIAL TAP;  Surgeon: Jettie Booze, MD;  Location: [facility] CATH LAB;  Service: Cardiovascular;  Laterality: N/A;  . Left heart catheterization with coronary angiogram N/A 08/08/2013    Procedure: LEFT HEART CATHETERIZATION WITH CORONARY ANGIOGRAM;  Surgeon: Jettie Booze, MD;  Location: [facility] CATH LAB;  Service: Cardiovascular;  Laterality: N/A;  . Transthoracic echocardiogram  10/30/14    Mod LVH, EF 60-65%, normal wall motion, mod mitral regurg, mild PAH     Current Outpatient Prescriptions  Medication Sig Dispense Refill  . ACCU-CHEK AVIVA PLUS test strip     . acetaminophen (TYLENOL) 325 MG tablet Take 2 tablets (650 mg total) by mouth every 4 (four) hours as needed for headache or mild pain.    [name] albuterol (PROVENTIL HFA;VENTOLIN HFA) 108 (90 BASE) MCG/ACT inhaler Inhale 2 puffs into the lungs every 4 (four) hours as needed for wheezing or shortness of breath. 1 Inhaler 2    . aspirin 81 MG tablet Take 81 mg by mouth at bedtime.     . budesonide-formoterol (SYMBICORT) 160-4.5 MCG/ACT inhaler Inhale 2 puffs into the lungs 2 (two) times daily. 1 Inhaler 12  . buPROPion (ZYBAN) 150 MG 12 hr tablet Take 150 mg by mouth 2 (two) times daily.    . chlorthalidone (HYGROTON) 25 MG tablet Take by mouth daily.    . Cholecalciferol (VITAMIN D-3) 1000 units CAPS Take 1,000 Units by mouth daily.     . Chromium Picolinate 1000 MCG TABS Take 1,000 mcg by mouth daily.     . citalopram (CELEXA) 40 MG tablet Take 1 tablet (40 mg total) by  mouth daily. 90 tablet 3  . diltiazem (CARDIZEM CD) 240 MG 24 hr capsule Take 1 capsule (240 mg total) by mouth daily. 30 capsule 1  . furosemide (LASIX) 20 MG tablet Take 1 tablet (20 mg total) by mouth daily. 90 tablet 3  . glimepiride (AMARYL) 4 MG tablet Take 2 tablets by mouth  daily with breakfast 180 tablet 1  . guaiFENesin (MUCINEX) 600 MG 12 hr tablet Take 2 tablets (1,200 mg total) by mouth 2 (two) times daily. 30 tablet 0  . HYDROcodone-acetaminophen (NORCO/VICODIN) 5-325 MG tablet Take 1 tablet by mouth every 6 (six) hours as needed for moderate pain. 15 tablet 0  . insulin regular (NOVOLIN R,HUMULIN R) 100 units/mL injection 8 Units SQ with BF and Lunch, 6 Units SQ with Supper 10 mL 6  . isosorbide mononitrate (IMDUR) 30 MG 24 hr tablet Take 1 tablet by mouth  daily 90 tablet 3  . LEVEMIR FLEXTOUCH 100 UNIT/ML Pen Inject 45 Units into the  skin at bedtime. 45 mL 3  . lovastatin (MEVACOR) 40 MG tablet Take 1 tablet (40 mg total) by mouth 2 (two) times daily. 180 tablet 1  . metFORMIN (GLUCOPHAGE) 1000 MG tablet Take 1 tablet (1,000 mg total) by mouth 2 (two) times daily with a meal. 180 tablet 1  . Multiple Vitamin (MULTIVITAMIN WITH MINERALS) TABS Take 1 tablet by mouth daily.    . pioglitazone (ACTOS) 45 MG tablet Take by mouth daily.    . potassium chloride (K-DUR) 10 MEQ tablet Take 1 tablet (10 mEq total) by mouth daily. 90 tablet 3   . ramipril (ALTACE) 10 MG capsule Take 10 mg by mouth 2 (two) times daily.     . rivaroxaban (XARELTO) 20 MG TABS tablet Take 1 tablet (20 mg total) by mouth daily with supper. 90 tablet 4  . tamsulosin (FLOMAX) 0.4 MG CAPS capsule TAKE 1 CAPSULE BY MOUTH  EVERY DAY AFTER SUPPER 90 capsule 1   No current facility-administered medications for this visit.    Allergies:   Morphine and related; Demerol; and Starlix    Social History:  The patient  reports that he quit smoking about 2 months ago. His smoking use included Cigarettes. He has a 62 pack-year smoking history. He quit smokeless tobacco use about 18 months ago. He reports that he does not drink alcohol or use illicit drugs.   Family History:  The patient's family history includes Breast cancer in his sister; CVA in his mother; Diabetes in his maternal uncle and paternal grandmother; Heart attack in his father; Heart disease in his father and maternal uncle; Hypertension in his mother. There is no history of Stroke.    ROS:  Please see the history of present illness.   Otherwise, review of systems are positive for fatigue, dyspnea on exertion.   All other systems are reviewed and negative.    PHYSICAL EXAM: VS:  BP 125/60 mmHg  Pulse 80  Ht 6' (1.829 m)  Wt 247 lb (112.038 kg)  BMI 33.49 kg/m2 , BMI Body mass index is 33.49 kg/(m^2). GEN: Well nourished, well developed, in no acute distress HEENT: normal Neck: no JVD, carotid bruits, or masses Cardiac: RRR; no murmurs, rubs, or gallops,no edema  Respiratory:  clear to auscultation bilaterally, normal work of breathing GI: soft, nontender, nondistended, + BS MS: no deformity or atrophy Skin: warm and dry, no rash Neuro:  Strength and sensation are intact Psych: euthymic mood, full affect   EKG:   The  ekg ordered today demonstrates normal sinus rhythm, poor R-wave progression, no ST segment changes   Recent Labs: 10/29/2014: B Natriuretic Peptide 402.0* 11/30/2014: ALT  21; BUN 25*; Creatinine, Ser 1.21; Potassium 4.4; Sodium 133* 12/01/2014: Hemoglobin 10.6*; Platelets 188   Lipid Panel    Component Value Date/Time   CHOL 161 04/17/2014 0826   TRIG 218.0* 04/17/2014 0826   HDL 47.60 04/17/2014 0826   CHOLHDL 3 04/17/2014 0826   VLDL 43.6* 04/17/2014 0826   LDLCALC 63 07/04/2013 0325   LDLDIRECT 77.0 04/17/2014 0826     Other studies Reviewed: Additional studies/ records that were reviewed today with results demonstrating: LVEF 60-65%.  Episode of atrial fibrillation documented in the hospital in October 2016. He was started on Zaroxolyn at that time.   ASSESSMENT AND PLAN:  1. Coronary artery disease:  No angina. Status post bypass surgery. Continue aspirin for now. If he develops any bleeding problems, would have to stop aspirin in favor of Xarelto. PAF: Xarelto for stroke prevention.  Noted on 11/02/14 ECG.  This patients CHA2DS2-VASc Score and unadjusted Ischemic Stroke Rate (% per year) is equal to 7.2 % stroke rate/year from a score of 5  Above score calculated as 1 point each if present [CHF, HTN, DM, Vascular=MI/PAD/Aortic Plaque, Age if 65-74, or Male] Above score calculated as 2 points each if present [Age > 75, or Stroke/TIA/TE]  Hyperlipidemia: Continue lipid-lowering therapy. LDL in April 2016 well controlled.  Hypertension: Well controlled.   Current medicines are reviewed at length with the patient today.  The patient concerns regarding his medicines were addressed.  The following changes have been made:  No change  Labs/ tests ordered today include:  No orders of the defined types were placed in this encounter.    Recommend 150 minutes/week of aerobic exercise Low fat, low carb, high fiber diet recommended  Disposition:   FU in one year   Teresita Madura., MD  01/14/2015 4:08 PM    [facility] [address], [place], [place]  [address] Phone: [phone]; Fax: [phone]

## 2015-01-14 NOTE — Patient Instructions (Signed)
Medication Instructions:  Your physician recommends that you continue on your current medications as directed. Please refer to the Current Medication list given to you today.   Labwork: none  Testing/Procedures: none  Follow-Up: Your physician wants you to follow-up in: 12 months.  You will receive a reminder letter in the mail two months in advance. If you don't receive a letter, please call our office to schedule the follow-up appointment.  Try to walk for 30 minutes 5 days per week.  Any Other Special Instructions Will Be Listed Below (If Applicable).     If you need a refill on your cardiac medications before your next appointment, please call your pharmacy.
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
## 2015-01-15 ENCOUNTER — Encounter: Payer: Self-pay | Admitting: Family Medicine

## 2015-01-22 ENCOUNTER — Other Ambulatory Visit: Payer: Self-pay | Admitting: *Deleted

## 2015-01-22 ENCOUNTER — Other Ambulatory Visit: Payer: Self-pay | Admitting: Family Medicine

## 2015-01-22 MED ORDER — BUPROPION HCL ER (SR) 150 MG PO TB12: ORAL_TABLET | ORAL | Status: DC

## 2015-01-22 NOTE — Telephone Encounter (Signed)
Pt LMOM on 01/22/15 at 11:54am requesting a 30 day supply be sent to [facility] for Wellbutrin. He stated that he is out and some sent to local to get him by til mail order comes in. Rx sent #30 w/ 0RF. Pt advised and voiced understanding.

## 2015-01-22 NOTE — Telephone Encounter (Signed)
RF request for bupropion LOV: 12/17/14 Next ov: 02/18/15 Last written: 05/24/14 #180 w/ 1RF

## 2015-01-23 ENCOUNTER — Other Ambulatory Visit: Payer: Self-pay | Admitting: *Deleted

## 2015-01-23 NOTE — Patient Outreach (Addendum)
S:  Pt has steadily improved. They did worry about a low blood sugar yesterday late afternoon that was 45. They treated this appropriately with orange juice and his evening meal. They are using the [employer] calendar and disease management book to record daily wts, glucose levels. Pt follows a low salt diet.  Pt has recently seen both Dr. Irish Lack and Dr. Ernestine Conrad.   O:  BP 144/64 mmHg  Pulse 63  Resp 16  Wt 244 lb (110.678 kg)  SpO2 97%        RRR       Lungs clear       Trace edema  A:  Cellulitis resolved       HF stable       DM moderately controlled       Depression improved  P:  Reinforced all his chronic disease management self care:        We discussed case closure and transition to a telephonic health coach. We will graduate pt next month.       Gave sheet on What to do before you go to the ED and the nondiscrimination information sheet.   Depression screen [facility] 2/9 01/23/2015 01/10/2015 12/17/2014 11/21/2014 11/19/2014  Decreased Interest 0 1 0 0 0  Down, Depressed, Hopeless 1 3 0 3 3  PHQ - 2 Score 1 4 0 3 3  Altered sleeping 0 0 - 0 0  Tired, decreased energy - 3 - 3 3  Change in appetite 0 0 - 0 0  Feeling bad or failure about yourself  0 1 - 0 0  Trouble concentrating 0 1 - 1 1  Moving slowly or fidgety/restless 0 1 - 0 0  Suicidal thoughts 1 0 - 0 0  PHQ-9 Score 2 10 - 7 7  Difficult doing work/chores - Somewhat difficult - Not difficult at all Not difficult at all    Deloria Lair [facility] [place] [phone]

## 2015-01-29 ENCOUNTER — Other Ambulatory Visit: Payer: Self-pay | Admitting: *Deleted

## 2015-01-29 ENCOUNTER — Telehealth: Payer: Self-pay | Admitting: *Deleted

## 2015-01-29 MED ORDER — NITROGLYCERIN 0.4 MG SL SUBL: 0.4000 mg | SUBLINGUAL_TABLET | SUBLINGUAL | Status: DC | PRN

## 2015-01-29 MED ORDER — METFORMIN HCL 1000 MG PO TABS: 1000.0000 mg | ORAL_TABLET | Freq: Two times a day (BID) | ORAL | Status: DC

## 2015-01-29 NOTE — Telephone Encounter (Signed)
OK to fill SL NTG

## 2015-01-29 NOTE — Telephone Encounter (Signed)
Rx sent. Pts wife advised and voiced understanding.

## 2015-01-29 NOTE — Telephone Encounter (Signed)
Pt LMOM on 01/29/15 at 10:16am requesting refill for Nitroglycerin. Pharm: [facility]. This is not an active medication on pts list. Please advise. Thanks.

## 2015-01-29 NOTE — Telephone Encounter (Signed)
RF request for metformin LOV: 12/17/14 Next ov: 02/18/15 Last written: 05/24/14 #180 w/ 1RF

## 2015-01-29 NOTE — Telephone Encounter (Signed)
Dr. Irish Lack. Please advise. Thanks.

## 2015-01-29 NOTE — Telephone Encounter (Signed)
I would prefer that pt request RF of this med through his cardiologist.-thx

## 2015-02-06 ENCOUNTER — Encounter: Payer: Self-pay | Admitting: Family Medicine

## 2015-02-06 ENCOUNTER — Ambulatory Visit (INDEPENDENT_AMBULATORY_CARE_PROVIDER_SITE_OTHER): Payer: Medicare Other | Admitting: Family Medicine

## 2015-02-06 VITALS — BP 95/61 | HR 77 | Temp 98.4°F | Resp 20 | Wt 249.0 lb

## 2015-02-06 DIAGNOSIS — J069 Acute upper respiratory infection, unspecified: Secondary | ICD-10-CM | POA: Insufficient documentation

## 2015-02-06 DIAGNOSIS — J029 Acute pharyngitis, unspecified: Secondary | ICD-10-CM

## 2015-02-06 MED ORDER — FLUTICASONE PROPIONATE 50 MCG/ACT NA SUSP: 2.0000 | Freq: Every day | NASAL | Status: DC

## 2015-02-06 MED ORDER — AMOXICILLIN-POT CLAVULANATE 875-125 MG PO TABS: 1.0000 | ORAL_TABLET | Freq: Two times a day (BID) | ORAL | Status: DC

## 2015-02-06 NOTE — Patient Instructions (Signed)
I have called in flonase, which is a nasal spray to use 2- 4 weeks. I have handed you a prescription for antibiotic, fill only if symptoms are not improving on Friday or you are worsening tomorrow. Use mucinex and robitussin (Robitussin DM has mucinex in it).  Rest and stay hydrated. F/U if you are not improved next week

## 2015-02-06 NOTE — Progress Notes (Signed)
Patient ID: Joshua [name], male   DOB: 28-Oct-1940, 75 y.o.   MRN: [id number]    Joshua [name] , August 12, 1940, 74 y.o., male MRN: [id number]  CC: Cough/sore throat Subjective: Pt presents for an acute OV with complaints of dry cough/sore throat of 5 days duration.  Associated symptoms include nothing. Pt has tried cough drops and robitussin DM to ease their symptoms, with some relief.  No sick contacts. UTD immunizations. He denies nausea, vonit or diarrhea.[name] He is eating and drinking well.  Allergies  Allergen Reactions  . Morphine And Related Other (See Comments)    Drenched with perspiration  . Demerol [Meperidine] Nausea Only  . Starlix [Nateglinide] Other (See Comments)    gassy   Social History  Substance Use Topics  . Smoking status: Former Smoker -- 1.00 packs/day for 62 years    Types: Cigarettes    Quit date: 10/29/2014  . Smokeless tobacco: Former Systems developer    Quit date: 06/30/2013  . Alcohol Use: No   Past Medical History  Diagnosis Date  . Hypertension   . Tobacco dependence in remission     100+ pack-yr hx: quit after CABG  . Obesity   . Hyperplastic colon polyp 2001  . Erectile dysfunction     Normal testosterone  . Adenomatous colon polyp 10/16/2011    Repeat 2018  . 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 pylori neg, no dysplasia or malignancy.  . Macular degeneration, dry     Mild, bilat (Optometrist, Mayford Knife at [facility] in [place], [place])  . Iron deficiency anemia 2014    03/2012 capsule endoscopy showed 2 AVMs--likely responsible for his IDA--lifetime iron supp recommended + q30moCBCs.  . 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 COPD (chronic obstructive pulmonary disease) ([facility]     GOLD II.  Spirometry  2004 borderline obstruction; 2015 mod obst  . Hyperlipidemia   . Pericardial effusion with cardiac tamponade 6//29/15    periocardialcentesis was done,  Infectious/inflamm (cytology showed NO MALIGNANT CELLS)    . Other and unspecified angina pectoris   . DOE (dyspnea on exertion)     COPD + chronic diastolic HF  . Pneumonia   . DM type 2 (diabetes mellitus, type 2) (HCC)     Poor control on max oral meds--pt eventually agreed to insulin therap  . CAD, multiple vessel     3V CAD cath 08/08/13----CABG done shortly after.  . Chronic renal insufficiency, stage II (mild)   . Diabetic nephropathy (HCC)     Elevated urine microalb/cr 03/2011  . Hearing loss of both ears 2016    Hearing aids  . Chronic diastolic heart failure ([facility] 2016  . PAF (paroxysmal atrial fibrillation) ([facility] 10/2014    xarelto   Past Surgical History  Procedure Laterality Date  . Cholecystectomy open  1999  . Appendectomy  1957  . Hip surgery Right 1954    Repair of slipped capital femoral epiphysis.  . Patella fracture surgery Left ~ 1979    bolt + 3 screws to repair tib plateau fx  . Testicle surgery  as a child    Undescended testicle brought down into scrotum  . Tonsillectomy  1947  . Colonoscopy  10/16/2011    Procedure: COLONOSCOPY;  Surgeon: RInda Castle MD;  [facility];  Service: Endoscopy;  Laterality: N/A;  . Cataract extraction w/ intraocular lens  implant, bilateral  04/08/2006 & 04/22/2006  .  Esophagogastroduodenoscopy  02/25/12    Atrophic gastritis with a few erosions--capsule endoscopy planned as of 02/25/12 (Dr. Deatra Ina).  . Total hip arthroplasty Right 08/12/2012    Procedure: REMOVAL OF OLD PINS RIGHT HIP AND RIGHT TOTAL HIP ARTHROPLASTY ANTERIOR APPROACH;  Surgeon: Mcarthur Rossetti, MD;  [facility];  Service: Orthopedics;  Laterality: Right;  . Hardware removal Right 08/12/2012    Procedure: HARDWARE REMOVAL;  Surgeon: Mcarthur Rossetti, MD;  [facility];  Service: Orthopedics;  Laterality: Right;  . Cardiac catheterization  08/08/2013  . Cataract extraction w/ intraocular lens  implant, bilateral Bilateral   . Coronary artery bypass graft N/A 08/14/2013    Procedure:  CORONARY ARTERY BYPASS GRAFTING (CABG);  Surgeon: Melrose Nakayama, MD;  Location: [facility];  Service: Open Heart Surgery;  Laterality: N/A;  Times 4   using left internal mammary artery and endoscopically harvested bilateral saphenous vein  . Intraoperative transesophageal echocardiogram N/A 08/14/2013    Normal LV function. Procedure: INTRAOPERATIVE TRANSESOPHAGEAL ECHOCARDIOGRAM;  Surgeon: Melrose Nakayama, MD;  Location: [facility];  Service: Open Heart Surgery;  Laterality: N/A;  . Pericardial tap N/A 07/01/2013    Procedure: PERICARDIAL TAP;  Surgeon: Jettie Booze, MD;  Location: [facility] CATH LAB;  Service: Cardiovascular;  Laterality: N/A;  . Left heart catheterization with coronary angiogram N/A 08/08/2013    Procedure: LEFT HEART CATHETERIZATION WITH CORONARY ANGIOGRAM;  Surgeon: Jettie Booze, MD;  Location: [facility] CATH LAB;  Service: Cardiovascular;  Laterality: N/A;  . Transthoracic echocardiogram  10/30/14    Mod LVH, EF 60-65%, normal wall motion, mod mitral regurg, mild PAH   Family History  Problem Relation Age of Onset  . Breast cancer Sister   . Diabetes Maternal Uncle   . Diabetes Paternal Grandmother   . Heart disease Father   . Heart disease Maternal Uncle   . CVA Mother   . Heart attack Father   . Hypertension Mother   . Stroke Neg Hx      Medication List       This list is accurate as of: 02/06/15  2:46 PM.  Always use your most recent med list.               ACCU-CHEK AVIVA PLUS test strip  Generic drug:  glucose blood     acetaminophen 325 MG tablet  Commonly known as:  TYLENOL  Take 2 tablets (650 mg total) by mouth every 4 (four) hours as needed for headache or mild pain.     albuterol 108 (90 Base) MCG/ACT inhaler  Commonly known as:  PROVENTIL HFA;VENTOLIN HFA  Inhale 2 puffs into the lungs every 4 (four) hours as needed for wheezing or shortness of breath.     aspirin 81 MG tablet  Take 81 mg by mouth at bedtime.     budesonide-formoterol  160-4.5 MCG/ACT inhaler  Commonly known as:  SYMBICORT  Inhale 2 puffs into the lungs 2 (two) times daily.     buPROPion 150 MG 12 hr tablet  Commonly known as:  WELLBUTRIN SR  Take 1 tablet by mouth two  times daily     chlorthalidone 25 MG tablet  Commonly known as:  HYGROTON  Take by mouth daily.     Chromium Picolinate 1000 MCG Tabs  Take 1,000 mcg by mouth daily.     citalopram 40 MG tablet  Commonly known as:  CELEXA  Take 1 tablet (40 mg total) by mouth daily.  diltiazem 240 MG 24 hr capsule  Commonly known as:  CARDIZEM CD  Take 1 capsule (240 mg total) by mouth daily.     furosemide 40 MG tablet  Commonly known as:  LASIX     glimepiride 4 MG tablet  Commonly known as:  AMARYL  Take 2 tablets by mouth  daily with breakfast     guaiFENesin 600 MG 12 hr tablet  Commonly known as:  MUCINEX  Take 2 tablets (1,200 mg total) by mouth 2 (two) times daily.     HYDROcodone-acetaminophen 5-325 MG tablet  Commonly known as:  NORCO/VICODIN  Take 1 tablet by mouth every 6 (six) hours as needed for moderate pain.     insulin regular 100 units/mL injection  Commonly known as:  NOVOLIN R,HUMULIN R  8 Units SQ with BF and Lunch, 6 Units SQ with Supper     isosorbide mononitrate 30 MG 24 hr tablet  Commonly known as:  IMDUR  Take 1 tablet by mouth  daily     LEVEMIR FLEXTOUCH 100 UNIT/ML Pen  Generic drug:  Insulin Detemir  Inject 45 Units into the  skin at bedtime.     lovastatin 40 MG tablet  Commonly known as:  MEVACOR  Take 1 tablet (40 mg total) by mouth 2 (two) times daily.     metFORMIN 1000 MG tablet  Commonly known as:  GLUCOPHAGE  Take 1 tablet (1,000 mg total) by mouth 2 (two) times daily with a meal.     multivitamin with minerals Tabs tablet  Take 1 tablet by mouth daily.     nitroGLYCERIN 0.4 MG SL tablet  Commonly known as:  NITROSTAT  Place 1 tablet (0.4 mg total) under the tongue every 5 (five) minutes as needed for chest pain.      pioglitazone 45 MG tablet  Commonly known as:  ACTOS  Take by mouth daily.     potassium chloride 10 MEQ tablet  Commonly known as:  K-DUR  Take 1 tablet (10 mEq total) by mouth daily.     ramipril 10 MG capsule  Commonly known as:  ALTACE  Take 10 mg by mouth 2 (two) times daily.     rivaroxaban 20 MG Tabs tablet  Commonly known as:  XARELTO  Take 1 tablet (20 mg total) by mouth daily with supper.     tamsulosin 0.4 MG Caps capsule  Commonly known as:  FLOMAX  TAKE 1 CAPSULE BY MOUTH  EVERY DAY AFTER SUPPER     Vitamin D-3 1000 units Caps  Take 1,000 Units by mouth daily.       ROS: Negative, with the exception of above mentioned in HPI Objective:  BP 95/61 mmHg  Pulse 77  Temp(Src) 98.4 F (36.9 C)  Resp 20  Wt 249 lb (112.946 kg)  SpO2 94% Body mass index is 33.76 kg/(m^2). Gen: Afebrile. No acute distress. Nontoxic in appearance.  HENT: AT. [place]. Bilateral TM visualized and normal in appearance. MMM, no oral lesions. Bilateral nares erythema left, no swelling. Throat without erythema or exudates. Dry cough on exam. No hoarseness. No TTP facial sinus.  Eyes:Pupils Equal Round Reactive to light, Extraocular movements intact,  Conjunctiva without redness, discharge or icterus. Neck/lymp/endocrine: Supple,No lymphadenopathy CV: RRR, no edema Chest: CTAB, no wheeze or crackles. Good air movement, normal resp effort.  Neuro: Normal gait. PERLA. EOMi. Alert. Oriented x3 Assessment/Plan: Joshua [name] is a 75 y.o. male present for acute OV  1. Sore throat - POCT  rapid strep A--> negative  2. Acute upper respiratory infection - Discussed OTC management with robitussin DM (mucinex). Rest and hydration. Called in [facility] to start. - Prescribed Augmentin to fill only if worsening or not improving by friday - amoxicillin-clavulanate (AUGMENTIN) 875-125 MG tablet; Take 1 tablet by mouth 2 (two) times daily.  Dispense: 20 tablet; Refill: 0 - fluticasone (FLONASE) 50 MCG/ACT nasal  spray; Place 2 sprays into both nostrils daily.  Dispense: 16 g; Refill: 0  - if needs of abx, and worsening will need to see next week    Howard Pouch, DO  [place]

## 2015-02-08 ENCOUNTER — Encounter: Payer: Self-pay | Admitting: Family Medicine

## 2015-02-08 ENCOUNTER — Ambulatory Visit (INDEPENDENT_AMBULATORY_CARE_PROVIDER_SITE_OTHER): Payer: Medicare Other | Admitting: Family Medicine

## 2015-02-08 ENCOUNTER — Other Ambulatory Visit: Payer: Self-pay | Admitting: Licensed Clinical Social Worker

## 2015-02-08 VITALS — BP 105/57 | HR 79 | Temp 98.9°F | Resp 16 | Ht 72.0 in | Wt 250.5 lb

## 2015-02-08 DIAGNOSIS — J069 Acute upper respiratory infection, unspecified: Secondary | ICD-10-CM | POA: Diagnosis not present

## 2015-02-08 DIAGNOSIS — J018 Other acute sinusitis: Secondary | ICD-10-CM | POA: Diagnosis not present

## 2015-02-08 DIAGNOSIS — J438 Other emphysema: Secondary | ICD-10-CM

## 2015-02-08 DIAGNOSIS — B9789 Other viral agents as the cause of diseases classified elsewhere: Secondary | ICD-10-CM

## 2015-02-08 DIAGNOSIS — I5032 Chronic diastolic (congestive) heart failure: Secondary | ICD-10-CM | POA: Diagnosis not present

## 2015-02-08 NOTE — Progress Notes (Signed)
Pre visit review using our clinic review tool, if applicable. No additional management support is needed unless otherwise documented below in the visit note.

## 2015-02-08 NOTE — Patient Outreach (Signed)
Assessment:  CSW called client home phone number on 02/08/15 and spoke via phone with [name], spouse of client. CSW verified identity of Ezechiel Stooksbury. [name] and CSW spoke of client needs. [name] reported that client is walking more and uses cane to help him walk. She said he is socializing more and she thinks his mood has improved.  She said he goes with friend to eat breakfast in the community once per week. CSW and [name] spoke of client care plan. She said client is using relaxation techniques to help him relax.  CSW encouraged for client to continue to use relaxation techniques to help client manage depression symptoms.  She said client has attended all scheduled client medical appointments in past 30 days.   He has his prescribed medications and is taking medications as prescribed.  Client has [employer] coverage.  Client can drive himself to scheduled medical appointments.  Client has good appetite aland is eating meals regularly.  Client sees Dr. Anitra Lauth as his primary doctor.  Client has appointment with Dr. Anitra Lauth on 02/18/15.  Client also had appointment with Dr. Ivar Drape on 02/06/15 ( at the practice of Dr. Anitra Lauth)  related to sinus issues for client.  Client has appointment with pulmonologist in May of 2017.  Client is socializing more with family.  [name] said that she thought client's attitude had improved and that client was indeed socializing more with family and friends and thus has had a better mood recenlty.  CSW thanked [place] for phone conversation with CSW on 02/08/15. CSW encouraged [name]/client to call CSW at [phone] to discuss social work needs of client.  Plan: Client to attend all scheduled client medical appointments in next 30 days. Client to use relaxation techniques in next 30 days to help client manage depression symptoms. CSW to call client in 4 weeks to assess client needs.  [name].Zonya Gudger MSW, LCSW Licensed Clinical Social Worker [facility] [phone]

## 2015-02-08 NOTE — Progress Notes (Signed)
OFFICE VISIT  02/08/2015   CC:  Chief Complaint  Patient presents with  . URI    seen on 02/06/15 by Dr. Raoul Pitch   HPI:    Patient is a 75 y.o. Caucasian male who presents for resp complaints. Was seen here 2 d/a by Dr. Raoul Pitch, dx'd with URI/pharyngitis, rx'd augmentin to fill if not improving by today.  Says he feels better than when he was here 2 d/a.  Started abx last night and has been taking mucinex.  Generalized weakness and still with nasal congestion, and felt like he needed to be rechecked.  No fever.  He is eating and drinking normally.  Dry cough is a bit better.  Chronic DOE unchanged.  No n/v/d or rash. He has been sick with this illness for about 6-7 days now.   Past Medical History  Diagnosis Date  . Hypertension   . Tobacco dependence in remission     100+ pack-yr hx: quit after CABG  . Obesity   . Hyperplastic colon polyp 2001  . Erectile dysfunction     Normal testosterone  . Adenomatous colon polyp 10/16/2011    Repeat 2018  . 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 pylori neg, no dysplasia or malignancy.  . Macular degeneration, dry     Mild, bilat (Optometrist, Mayford Knife at [facility] [facility] [place] in [place], [place])  . Iron deficiency anemia 2014    03/2012 capsule endoscopy showed 2 AVMs--likely responsible for his IDA--lifetime iron supp recommended + q51moCBCs.  . 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 COPD (chronic obstructive pulmonary disease) ([facility]     GOLD II.  Spirometry  2004 borderline obstruction; 2015 mod obst  . Hyperlipidemia   . Pericardial effusion with cardiac tamponade 6//29/15    pericardiocentesis was done,  Infectious/inflamm (cytology showed NO MALIGNANT CELLS)  . Other and unspecified angina pectoris   . DOE (dyspnea on exertion)     COPD + chronic diastolic HF  . Pneumonia   . DM type 2 (diabetes mellitus, type 2) (HCC)     Poor control on max oral meds--pt eventually agreed to insulin therap  .  CAD, multiple vessel     3V CAD cath 08/08/13----CABG done shortly after.  . Chronic renal insufficiency, stage II (mild)   . Diabetic nephropathy (HCC)     Elevated urine microalb/cr 03/2011  . Hearing loss of both ears 2016    Hearing aids  . Chronic diastolic heart failure ([facility] 2016  . PAF (paroxysmal atrial fibrillation) ([facility] 10/2014    xarelto    Past Surgical History  Procedure Laterality Date  . Cholecystectomy open  1999  . Appendectomy  1957  . Hip surgery Right 1954    Repair of slipped capital femoral epiphysis.  . Patella fracture surgery Left ~ 1979    bolt + 3 screws to repair tib plateau fx  . Testicle surgery  as a child    Undescended testicle brought down into scrotum  . Tonsillectomy  1947  . Colonoscopy  10/16/2011    Procedure: COLONOSCOPY;  Surgeon: RInda Castle MD;  [facility];  Service: Endoscopy;  Laterality: N/A;  . Cataract extraction w/ intraocular lens  implant, bilateral  04/08/2006 & 04/22/2006  . Esophagogastroduodenoscopy  02/25/12    Atrophic gastritis with a few erosions--capsule endoscopy planned as of 02/25/12 (Dr. KDeatra Ina.  . Total hip arthroplasty Right 08/12/2012    Procedure:  REMOVAL OF OLD PINS RIGHT HIP AND RIGHT TOTAL HIP ARTHROPLASTY ANTERIOR APPROACH;  Surgeon: Mcarthur Rossetti, MD;  [facility];  Service: Orthopedics;  Laterality: Right;  . Hardware removal Right 08/12/2012    Procedure: HARDWARE REMOVAL;  Surgeon: Mcarthur Rossetti, MD;  [facility];  Service: Orthopedics;  Laterality: Right;  . Cardiac catheterization  08/08/2013  . Cataract extraction w/ intraocular lens  implant, bilateral Bilateral   . Coronary artery bypass graft N/A 08/14/2013    Procedure: CORONARY ARTERY BYPASS GRAFTING (CABG);  Surgeon: Melrose Nakayama, MD;  Location: [facility];  Service: Open Heart Surgery;  Laterality: N/A;  Times 4   using left internal mammary artery and endoscopically harvested bilateral saphenous vein  .  Intraoperative transesophageal echocardiogram N/A 08/14/2013    Normal LV function. Procedure: INTRAOPERATIVE TRANSESOPHAGEAL ECHOCARDIOGRAM;  Surgeon: Melrose Nakayama, MD;  Location: [facility];  Service: Open Heart Surgery;  Laterality: N/A;  . Pericardial tap N/A 07/01/2013    Procedure: PERICARDIAL TAP;  Surgeon: Jettie Booze, MD;  Location: [facility] CATH LAB;  Service: Cardiovascular;  Laterality: N/A;  . Left heart catheterization with coronary angiogram N/A 08/08/2013    Procedure: LEFT HEART CATHETERIZATION WITH CORONARY ANGIOGRAM;  Surgeon: Jettie Booze, MD;  Location: [facility] CATH LAB;  Service: Cardiovascular;  Laterality: N/A;  . Transthoracic echocardiogram  10/30/14    Mod LVH, EF 60-65%, normal wall motion, mod mitral regurg, mild PAH    Outpatient Prescriptions Prior to Visit  Medication Sig Dispense Refill  . ACCU-CHEK AVIVA PLUS test strip     . acetaminophen (TYLENOL) 325 MG tablet Take 2 tablets (650 mg total) by mouth every 4 (four) hours as needed for headache or mild pain.    [name] albuterol (PROVENTIL HFA;VENTOLIN HFA) 108 (90 BASE) MCG/ACT inhaler Inhale 2 puffs into the lungs every 4 (four) hours as needed for wheezing or shortness of breath. 1 Inhaler 2  . amoxicillin-clavulanate (AUGMENTIN) 875-125 MG tablet Take 1 tablet by mouth 2 (two) times daily. 20 tablet 0  . aspirin 81 MG tablet Take 81 mg by mouth at bedtime.     . budesonide-formoterol (SYMBICORT) 160-4.5 MCG/ACT inhaler Inhale 2 puffs into the lungs 2 (two) times daily. 1 Inhaler 12  . buPROPion (WELLBUTRIN SR) 150 MG 12 hr tablet Take 1 tablet by mouth two  times daily 30 tablet 0  . chlorthalidone (HYGROTON) 25 MG tablet Take by mouth daily.    . Cholecalciferol (VITAMIN D-3) 1000 units CAPS Take 1,000 Units by mouth daily.     . Chromium Picolinate 1000 MCG TABS Take 1,000 mcg by mouth daily.     . citalopram (CELEXA) 40 MG tablet Take 1 tablet (40 mg total) by mouth daily. 90 tablet 3  . diltiazem  (CARDIZEM CD) 240 MG 24 hr capsule Take 1 capsule (240 mg total) by mouth daily. 30 capsule 1  . fluticasone (FLONASE) 50 MCG/ACT nasal spray Place 2 sprays into both nostrils daily. 16 g 0  . furosemide (LASIX) 40 MG tablet 20 mg daily.     [name] glimepiride (AMARYL) 4 MG tablet Take 2 tablets by mouth  daily with breakfast 180 tablet 1  . guaiFENesin (MUCINEX) 600 MG 12 hr tablet Take 2 tablets (1,200 mg total) by mouth 2 (two) times daily. 30 tablet 0  . HYDROcodone-acetaminophen (NORCO/VICODIN) 5-325 MG tablet Take 1 tablet by mouth every 6 (six) hours as needed for moderate pain. 15 tablet 0  . insulin regular (NOVOLIN  R,HUMULIN R) 100 units/mL injection 8 Units SQ with BF and Lunch, 6 Units SQ with Supper 10 mL 6  . isosorbide mononitrate (IMDUR) 30 MG 24 hr tablet Take 1 tablet by mouth  daily 90 tablet 3  . LEVEMIR FLEXTOUCH 100 UNIT/ML Pen Inject 45 Units into the  skin at bedtime. (Patient taking differently: Inject 55 Units into the  skin at bedtime.) 45 mL 3  . lovastatin (MEVACOR) 40 MG tablet Take 1 tablet (40 mg total) by mouth 2 (two) times daily. 180 tablet 1  . metFORMIN (GLUCOPHAGE) 1000 MG tablet Take 1 tablet (1,000 mg total) by mouth 2 (two) times daily with a meal. 180 tablet 1  . Multiple Vitamin (MULTIVITAMIN WITH MINERALS) TABS Take 1 tablet by mouth daily.    . nitroGLYCERIN (NITROSTAT) 0.4 MG SL tablet Place 1 tablet (0.4 mg total) under the tongue every 5 (five) minutes as needed for chest pain. 50 tablet 3  . pioglitazone (ACTOS) 45 MG tablet Take by mouth daily.    . potassium chloride (K-DUR) 10 MEQ tablet Take 1 tablet (10 mEq total) by mouth daily. 90 tablet 3  . ramipril (ALTACE) 10 MG capsule Take 10 mg by mouth 2 (two) times daily.     . rivaroxaban (XARELTO) 20 MG TABS tablet Take 1 tablet (20 mg total) by mouth daily with supper. 90 tablet 4  . tamsulosin (FLOMAX) 0.4 MG CAPS capsule TAKE 1 CAPSULE BY MOUTH  EVERY DAY AFTER SUPPER 90 capsule 1   No  facility-administered medications prior to visit.    Allergies  Allergen Reactions  . Morphine And Related Other (See Comments)    Drenched with perspiration  . Demerol [Meperidine] Nausea Only  . Starlix [Nateglinide] Other (See Comments)    gassy    ROS As per HPI  PE: Blood pressure 105/57, pulse 79, temperature 98.9 F (37.2 C), temperature source Oral, resp. rate 16, height 6' (1.829 m), weight 250 lb 8 oz (113.626 kg), SpO2 93 %. VS: noted--normal. Gen: alert, NAD, NONTOXIC but tired APPEARING. HEENT: eyes without injection, drainage, or swelling.  Ears: EACs clear, TMs with normal light reflex and landmarks.  Nose: Clear rhinorrhea, with some dried, crusty exudate adherent to mildly injected mucosa.  No purulent d/c.  No paranasal sinus TTP.  No facial swelling.  Throat and mouth without focal lesion.  No pharyngial swelling, erythema, or exudate.   Neck: supple, no LAD.   LUNGS: CTA bilat, nonlabored resps.  Good aeration. CV: RRR, no m/r/g. EXT: no c/c.  Trace bilat LL pitting edema SKIN: no rash  LABS:  None today  IMPRESSION AND PLAN:  1) URI, possibly bacterial---I think he has started turning the corner in the last 18h or so. No sign of LRTI or HF.  Continue augmentin.  Continue symptomatic care.  2) COPD: no sign of exacerbation at this time.  Continue current chronic meds for this.  3) Chronic diastolic HF: no sign of acute exacerbation at this time. Continue current chronic meds for this.  An After Visit Summary was printed and given to the patient.  FOLLOW UP: Return if symptoms worsen or fail to improve.

## 2015-02-11 ENCOUNTER — Ambulatory Visit: Payer: Medicare Other | Admitting: Interventional Cardiology

## 2015-02-13 ENCOUNTER — Other Ambulatory Visit: Payer: Self-pay | Admitting: *Deleted

## 2015-02-13 NOTE — Patient Outreach (Signed)
Follow up on nurse line contact. Joshua [name] reports that Joshua [name] had signs of an upper respiratory infection. She was able to contact a provider who gave an antibiotic. She says he is feeling a little better. He is also taking mucinex.  I encouraged plenty of water and continuation of the antibiotic course through the last pill!  I will see them on Feb. 21st for his discharge visit.  Deloria Lair [facility] [phone]

## 2015-02-18 ENCOUNTER — Encounter: Payer: Self-pay | Admitting: Family Medicine

## 2015-02-18 ENCOUNTER — Ambulatory Visit (INDEPENDENT_AMBULATORY_CARE_PROVIDER_SITE_OTHER): Payer: Medicare Other | Admitting: Family Medicine

## 2015-02-18 VITALS — BP 100/62 | HR 73 | Temp 98.3°F | Resp 16 | Ht 72.0 in | Wt 250.0 lb

## 2015-02-18 DIAGNOSIS — J438 Other emphysema: Secondary | ICD-10-CM | POA: Diagnosis not present

## 2015-02-18 DIAGNOSIS — I5032 Chronic diastolic (congestive) heart failure: Secondary | ICD-10-CM | POA: Insufficient documentation

## 2015-02-18 DIAGNOSIS — E118 Type 2 diabetes mellitus with unspecified complications: Secondary | ICD-10-CM | POA: Diagnosis not present

## 2015-02-18 DIAGNOSIS — I480 Paroxysmal atrial fibrillation: Secondary | ICD-10-CM | POA: Diagnosis not present

## 2015-02-18 DIAGNOSIS — I10 Essential (primary) hypertension: Secondary | ICD-10-CM

## 2015-02-18 LAB — BASIC METABOLIC PANEL
BUN: 25 mg/dL — AB (ref 6–23)
CO2: 26 mEq/L (ref 19–32)
Calcium: 9.3 mg/dL (ref 8.4–10.5)
Chloride: 104 mEq/L (ref 96–112)
Creatinine, Ser: 1.05 mg/dL (ref 0.40–1.50)
GFR: 73.02 mL/min (ref >60.00)
GLUCOSE: 140 mg/dL — AB (ref 70–99)
POTASSIUM: 5.5 meq/L — AB (ref 3.5–5.1)
Sodium: 139 mEq/L (ref 135–145)

## 2015-02-18 LAB — HEMOGLOBIN A1C: Hgb A1c MFr Bld: 7.9 % — ABNORMAL HIGH (ref 4.6–6.5)

## 2015-02-18 NOTE — Progress Notes (Signed)
OFFICE VISIT  02/18/2015   CC:  Chief Complaint  Patient presents with  . Follow-up    Pt is not fasting.    HPI:    Patient is a 75 y.o. Caucasian male who presents for 2 mo f/u DM 2, COPD, chronic diastolic CHF, and PAF--currently on xarelto.  DM: fastings 100 or less, lowest 72.  No checks later in day.  BP: monitoring at home 115/60s avg  Breathing: walks couple hundred feet before he feels short of breath.  Stops and rests and gets breath back quickly. Compliant with symbicort.  Needs albuterol every morning when he gets up.  Trying to eat low Na diet.  No palpitations or heart racing.  No CP, no vision complaints, no dizziness, no focal weakness. No nose bleeds, no melena/hematochezia.    Past Medical History  Diagnosis Date  . Hypertension   . Tobacco dependence in remission     100+ pack-yr hx: quit after CABG  . Obesity   . Hyperplastic colon polyp 2001  . Erectile dysfunction     Normal testosterone  . Adenomatous colon polyp 10/16/2011    Repeat 2018  . 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 pylori neg, no dysplasia or malignancy.  . Macular degeneration, dry     Mild, bilat (Optometrist, Mayford Knife at [facility] in [place], [place])  . Iron deficiency anemia 2014    03/2012 capsule endoscopy showed 2 AVMs--likely responsible for his IDA--lifetime iron supp recommended + q82moCBCs.  . 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 COPD (chronic obstructive pulmonary disease) ([place]     GOLD II.  Spirometry  2004 borderline obstruction; 2015 mod obst  . Hyperlipidemia   . Pericardial effusion with cardiac tamponade 6//29/15    pericardiocentesis was done,  Infectious/inflamm (cytology showed NO MALIGNANT CELLS)  . Other and unspecified angina pectoris   . DOE (dyspnea on exertion)     COPD + chronic diastolic HF  . Pneumonia   . DM type 2 (diabetes mellitus, type 2) (HCC)     Poor control on max oral meds--pt  eventually agreed to insulin therap  . CAD, multiple vessel     3V CAD cath 08/08/13----CABG done shortly after.  . Chronic renal insufficiency, stage II (mild)   . Diabetic nephropathy (HCC)     Elevated urine microalb/cr 03/2011  . Hearing loss of both ears 2016    Hearing aids  . Chronic diastolic heart failure ([facility] 2016  . PAF (paroxysmal atrial fibrillation) ([place] 10/2014    xarelto    Past Surgical History  Procedure Laterality Date  . Cholecystectomy open  1999  . Appendectomy  1957  . Hip surgery Right 1954    Repair of slipped capital femoral epiphysis.  . Patella fracture surgery Left ~ 1979    bolt + 3 screws to repair tib plateau fx  . Testicle surgery  as a child    Undescended testicle brought down into scrotum  . Tonsillectomy  1947  . Colonoscopy  10/16/2011    Procedure: COLONOSCOPY;  Surgeon: RInda Castle MD;  Location: [facility];  Service: Endoscopy;  Laterality: N/A;  . Cataract extraction w/ intraocular lens  implant, bilateral  04/08/2006 & 04/22/2006  . Esophagogastroduodenoscopy  02/25/12    Atrophic gastritis with a few erosions--capsule endoscopy planned as of 02/25/12 (Dr. KDeatra Ina.  . Total hip arthroplasty Right 08/12/2012    Procedure: REMOVAL OF  OLD PINS RIGHT HIP AND RIGHT TOTAL HIP ARTHROPLASTY ANTERIOR APPROACH;  Surgeon: Mcarthur Rossetti, MD;  Location: [facility];  Service: Orthopedics;  Laterality: Right;  . Hardware removal Right 08/12/2012    Procedure: HARDWARE REMOVAL;  Surgeon: Mcarthur Rossetti, MD;  Location: [facility];  Service: Orthopedics;  Laterality: Right;  . Cardiac catheterization  08/08/2013  . Cataract extraction w/ intraocular lens  implant, bilateral Bilateral   . Coronary artery bypass graft N/A 08/14/2013    Procedure: CORONARY ARTERY BYPASS GRAFTING (CABG);  Surgeon: Melrose Nakayama, MD;  Location: [facility];  Service: Open Heart Surgery;  Laterality: N/A;  Times 4   using left internal mammary artery and endoscopically  harvested bilateral saphenous vein  . Intraoperative transesophageal echocardiogram N/A 08/14/2013    Normal LV function. Procedure: INTRAOPERATIVE TRANSESOPHAGEAL ECHOCARDIOGRAM;  Surgeon: Melrose Nakayama, MD;  Location: [facility];  Service: Open Heart Surgery;  Laterality: N/A;  . Pericardial tap N/A 07/01/2013    Procedure: PERICARDIAL TAP;  Surgeon: Jettie Booze, MD;  Location[facility] C[facility] LAB;  Service: Cardiovascular;  Laterality: N/A;  . Left heart catheterization with coronary angiogram N/A 08/08/2013    Procedure: LEFT HEART CATHETERIZATION WITH CORONARY ANGIOGRAM;  Surgeon: Jettie Booze, MD;  Location: [facility] CATH LAB;  Service: Cardiovascular;  Laterality: N/A;  . Transthoracic echocardiogram  10/30/14    Mod LVH, EF 60-65%, normal wall motion, mod mitral regurg, mild PAH    Outpatient Prescriptions Prior to Visit  Medication Sig Dispense Refill  . ACCU-CHEK AVIVA PLUS test strip     . acetaminophen (TYLENOL) 325 MG tablet Take 2 tablets (650 mg total) by mouth every 4 (four) hours as needed for headache or mild pain.    [name] albuterol (PROVENTIL HFA;VENTOLIN HFA) 108 (90 BASE) MCG/ACT inhaler Inhale 2 puffs into the lungs every 4 (four) hours as needed for wheezing or shortness of breath. 1 Inhaler 2  . aspirin 81 MG tablet Take 81 mg by mouth at bedtime.     . budesonide-formoterol (SYMBICORT) 160-4.5 MCG/ACT inhaler Inhale 2 puffs into the lungs 2 (two) times daily. 1 Inhaler 12  . buPROPion (WELLBUTRIN SR) 150 MG 12 hr tablet Take 1 tablet by mouth two  times daily 30 tablet 0  . chlorthalidone (HYGROTON) 25 MG tablet Take by mouth daily.    . Cholecalciferol (VITAMIN D-3) 1000 units CAPS Take 1,000 Units by mouth daily.     . Chromium Picolinate 1000 MCG TABS Take 1,000 mcg by mouth daily.     . citalopram (CELEXA) 40 MG tablet Take 1 tablet (40 mg total) by mouth daily. 90 tablet 3  . diltiazem (CARDIZEM CD) 240 MG 24 hr capsule Take 1 capsule (240 mg total) by mouth daily. 30  capsule 1  . fluticasone (FLONASE) 50 MCG/ACT nasal spray Place 2 sprays into both nostrils daily. 16 g 0  . furosemide (LASIX) 40 MG tablet 20 mg daily.     [name] glimepiride (AMARYL) 4 MG tablet Take 2 tablets by mouth  daily with breakfast 180 tablet 1  . guaiFENesin (MUCINEX) 600 MG 12 hr tablet Take 2 tablets (1,200 mg total) by mouth 2 (two) times daily. 30 tablet 0  . insulin regular (NOVOLIN R,HUMULIN R) 100 units/mL injection 8 Units SQ with BF and Lunch, 6 Units SQ with Supper 10 mL 6  . isosorbide mononitrate (IMDUR) 30 MG 24 hr tablet Take 1 tablet by mouth  daily 90 tablet 3  . LEVEMIR FLEXTOUCH 100  UNIT/ML Pen Inject 45 Units into the  skin at bedtime. (Patient taking differently: Inject 55 Units into the  skin at bedtime.) 45 mL 3  . lovastatin (MEVACOR) 40 MG tablet Take 1 tablet (40 mg total) by mouth 2 (two) times daily. 180 tablet 1  . metFORMIN (GLUCOPHAGE) 1000 MG tablet Take 1 tablet (1,000 mg total) by mouth 2 (two) times daily with a meal. 180 tablet 1  . Multiple Vitamin (MULTIVITAMIN WITH MINERALS) TABS Take 1 tablet by mouth daily.    . nitroGLYCERIN (NITROSTAT) 0.4 MG SL tablet Place 1 tablet (0.4 mg total) under the tongue every 5 (five) minutes as needed for chest pain. 50 tablet 3  . pioglitazone (ACTOS) 45 MG tablet Take by mouth daily.    . potassium chloride (K-DUR) 10 MEQ tablet Take 1 tablet (10 mEq total) by mouth daily. 90 tablet 3  . ramipril (ALTACE) 10 MG capsule Take 10 mg by mouth 2 (two) times daily.     . rivaroxaban (XARELTO) 20 MG TABS tablet Take 1 tablet (20 mg total) by mouth daily with supper. 90 tablet 4  . tamsulosin (FLOMAX) 0.4 MG CAPS capsule TAKE 1 CAPSULE BY MOUTH  EVERY DAY AFTER SUPPER 90 capsule 1  . amoxicillin-clavulanate (AUGMENTIN) 875-125 MG tablet Take 1 tablet by mouth 2 (two) times daily. (Patient not taking: Reported on 02/18/2015) 20 tablet 0  . HYDROcodone-acetaminophen (NORCO/VICODIN) 5-325 MG tablet Take 1 tablet by mouth every 6  (six) hours as needed for moderate pain. (Patient not taking: Reported on 02/18/2015) 15 tablet 0   No facility-administered medications prior to visit.    Allergies  Allergen Reactions  . Morphine And Related Other (See Comments)    Drenched with perspiration  . Demerol [Meperidine] Nausea Only  . Starlix [Nateglinide] Other (See Comments)    gassy    ROS As per HPI  PE: Blood pressure 100/62, pulse 73, temperature 98.3 F (36.8 C), temperature source Oral, resp. rate 16, height 6' (1.829 m), weight 250 lb (113.399 kg), SpO2 96 %. Gen: Alert, well appearing.  Patient is oriented to person, place, time, and situation. CV: RRR, distant S1 and S2, no audible m/r/g LUNGS: CTA bilat, nonlabored.  Exp phase not prolonged.  Good aeration. EXT: 1+ pitting edema on R LL, 2+ pitting edema on L LL.   LABS:  Lab Results  Component Value Date   HGBA1C 7.7* 10/29/2014     Chemistry      Component Value Date/Time   NA 133* 11/30/2014 0441   K 4.4 11/30/2014 0441   CL 104 11/30/2014 0441   CO2 17* 11/30/2014 0441   BUN 25* 11/30/2014 0441   CREATININE 1.21 11/30/2014 0441   CREATININE 1.03 11/26/2014 1145      Component Value Date/Time   CALCIUM 8.6* 11/30/2014 0441   ALKPHOS 60 11/30/2014 0441   AST 20 11/30/2014 0441   ALT 21 11/30/2014 0441   BILITOT 0.5 11/30/2014 0441     Lab Results  Component Value Date   WBC 7.3 12/01/2014   HGB 10.6* 12/01/2014   HCT 33.1* 12/01/2014   MCV 83.0 12/01/2014   PLT 188 12/01/2014   Lab Results  Component Value Date   CHOL 161 04/17/2014   HDL 47.60 04/17/2014   LDLCALC 63 07/04/2013   LDLDIRECT 77.0 04/17/2014   TRIG 218.0* 04/17/2014   CHOLHDL 3 04/17/2014   IMPRESSION AND PLAN:  1) DM 2, fasting glucoses better (no hypoglycemia). Contiinue current insulin doses and  oral meds, but if A1c down I would like to d/c his actos (and his amaryl). HbA1c today.  2) HTN; The current medical regimen is effective;  continue present  plan and medications. Lytes/cr today.  3) COPD: The current medical regimen is effective;  continue present plan and medications.  4) Chronic diast CHF: volume status looks pretty good, wt stable since last visit with me 10d/a.  5) PAF: pt asymptomatic.  Cont cardizem and xarelto.  An After Visit Summary was printed and given to the patient.  FOLLOW UP: Return in about 3 months (around 05/18/2015) for routine chronic illness f/u.

## 2015-02-18 NOTE — Progress Notes (Signed)
Pre visit review using our clinic review tool, if applicable. No additional management support is needed unless otherwise documented below in the visit note.

## 2015-02-19 ENCOUNTER — Other Ambulatory Visit: Payer: Self-pay | Admitting: *Deleted

## 2015-02-19 DIAGNOSIS — E875 Hyperkalemia: Secondary | ICD-10-CM

## 2015-02-22 ENCOUNTER — Other Ambulatory Visit: Payer: Self-pay | Admitting: *Deleted

## 2015-02-22 NOTE — Telephone Encounter (Signed)
Opened in error.

## 2015-02-25 ENCOUNTER — Other Ambulatory Visit: Payer: Self-pay | Admitting: *Deleted

## 2015-02-25 ENCOUNTER — Other Ambulatory Visit: Payer: Self-pay | Admitting: Family Medicine

## 2015-02-25 ENCOUNTER — Other Ambulatory Visit (INDEPENDENT_AMBULATORY_CARE_PROVIDER_SITE_OTHER): Payer: Medicare Other

## 2015-02-25 DIAGNOSIS — E875 Hyperkalemia: Secondary | ICD-10-CM

## 2015-02-25 LAB — BASIC METABOLIC PANEL
BUN: 27 mg/dL — AB (ref 6–23)
CHLORIDE: 102 meq/L (ref 96–112)
CO2: 23 meq/L (ref 19–32)
CREATININE: 1.17 mg/dL (ref 0.40–1.50)
Calcium: 9.2 mg/dL (ref 8.4–10.5)
GFR: 64.45 mL/min (ref >60.00)
Glucose, Bld: 181 mg/dL — ABNORMAL HIGH (ref 70–99)
Potassium: 5.1 mEq/L (ref 3.5–5.1)
Sodium: 136 mEq/L (ref 135–145)

## 2015-02-25 MED ORDER — INSULIN DETEMIR 100 UNIT/ML FLEXPEN: PEN_INJECTOR | SUBCUTANEOUS | Status: DC

## 2015-02-25 NOTE — Telephone Encounter (Signed)
RF request for levemir LOV: 02/18/15 Next ov: 05/17/15 Last written: 01/04/15 #72m w/ 3RF

## 2015-02-26 ENCOUNTER — Ambulatory Visit: Payer: Self-pay | Admitting: *Deleted

## 2015-02-26 ENCOUNTER — Encounter: Payer: Self-pay | Admitting: *Deleted

## 2015-02-26 ENCOUNTER — Other Ambulatory Visit: Payer: Self-pay | Admitting: *Deleted

## 2015-02-26 NOTE — Patient Outreach (Signed)
S:  Pt is feeling very well. He is doing wonderful monitoring his FBS and PM glucose levels (evening levels are often >160 & last HgbA1c was elevated to 7.9) Wt is stable although I noted one wt of 248 which I feel is too high.  Noted pt is not taking Cardizem or Clorthalidone.  Pt is getting out in the community. Denies falls.  Continues to abstain from smoking!  O:  BP 110/50 mmHg  Pulse 82  Resp 18  Wt 243 lb (110.224 kg)  SpO2 95%       RRR       Lungs are clear       No edema  A:   Diabetes       HF  P:  Instructed pt to take furosemide 20 mg if wt reaches 248 and he may want to call Dr. Ernestine Conrad if wt is persistantly 248 or above AND has SOB, edema.  Today is my last home visit, I am graduating pt from [employer]. Theadore Nan, CSW, will be calling on pt one more time in March.  Deloria Lair [facility] [phone]

## 2015-02-27 ENCOUNTER — Encounter: Payer: Self-pay | Admitting: *Deleted

## 2015-03-08 ENCOUNTER — Other Ambulatory Visit: Payer: Self-pay | Admitting: Licensed Clinical Social Worker

## 2015-03-08 NOTE — Patient Outreach (Signed)
Assessment:  CSW called client home phone number on 03/08/15 and spoke via phone with Joshua [name] spouse of client. CSW verified identity of Joshua [name]. Joshua [name] and CSW spoke of client needs. Joshua [name] reported that client has his prescribed medications and is taking medications as prescribed.  Client has [employer].  Client is still socializing with friends a few times per week (gets breakfast with friends and talks with friends).  Client can drive himself to scheduled medical appointments and can drive himself to complete community errands needed. Joshua [name] said client likes to watch TV westerns as a form of relaxation or take a nap as a form of relaxation.  Client uses cane to ambulate.  He also has a rollator walker to use as needed in the community. Client had appointment with Dr. Anitra [name] two weeks ago.  Dr. Anitra [name] has changed the potassium prescription for client.  Client has appointment with pulmonologist in May of 2017. Client saw cardiologist in January of 2017. Client is eating well and sleeping well.  Regarding client care plan, CSW encouraged for client to continue to attend scheduled client medical appointments. CSW encouraged for client to continue to use relaxation techniques to help him manage anxiety and depression symptoms. CSW thanked [place] for phone call with CSW on 3/3/317.  CSW encouraged client/Joshua [name] to call CSW at [phone] to discuss social work needs of client.  Plan: Client to attend scheduled client medical appointments in next 30 days. Client to use relaxation techniques in next 30 days to help client manage anxiety and depression symptoms. CSW to call client/Joshua [name] Joshua [name] in 4 weeks to assess client needs.  Joshua [name].Joshua [name] MSW, LCSW Licensed Clinical Social Worker [facility] [phone]

## 2015-04-04 ENCOUNTER — Emergency Department (HOSPITAL_COMMUNITY): Payer: Medicare Other

## 2015-04-04 ENCOUNTER — Encounter (HOSPITAL_COMMUNITY): Payer: Self-pay | Admitting: *Deleted

## 2015-04-04 ENCOUNTER — Telehealth: Payer: Self-pay | Admitting: Family Medicine

## 2015-04-04 ENCOUNTER — Emergency Department (HOSPITAL_COMMUNITY)
Admission: EM | Admit: 2015-04-04 | Discharge: 2015-04-04 | Disposition: A | Discharge status: Home / Self Care | Payer: Medicare Other | Attending: Emergency Medicine | Admitting: Emergency Medicine

## 2015-04-04 DIAGNOSIS — Z79899 Other long term (current) drug therapy: Secondary | ICD-10-CM | POA: Diagnosis not present

## 2015-04-04 DIAGNOSIS — I25118 Atherosclerotic heart disease of native coronary artery with other forms of angina pectoris: Secondary | ICD-10-CM | POA: Insufficient documentation

## 2015-04-04 DIAGNOSIS — Y998 Other external cause status: Secondary | ICD-10-CM | POA: Diagnosis not present

## 2015-04-04 DIAGNOSIS — Z862 Personal history of diseases of the blood and blood-forming organs and certain disorders involving the immune mechanism: Secondary | ICD-10-CM | POA: Insufficient documentation

## 2015-04-04 DIAGNOSIS — I480 Paroxysmal atrial fibrillation: Secondary | ICD-10-CM | POA: Insufficient documentation

## 2015-04-04 DIAGNOSIS — E1121 Type 2 diabetes mellitus with diabetic nephropathy: Secondary | ICD-10-CM | POA: Insufficient documentation

## 2015-04-04 DIAGNOSIS — E669 Obesity, unspecified: Secondary | ICD-10-CM | POA: Diagnosis not present

## 2015-04-04 DIAGNOSIS — J449 Chronic obstructive pulmonary disease, unspecified: Secondary | ICD-10-CM | POA: Diagnosis not present

## 2015-04-04 DIAGNOSIS — S0990XA Unspecified injury of head, initial encounter: Secondary | ICD-10-CM | POA: Diagnosis not present

## 2015-04-04 DIAGNOSIS — Z86010 Personal history of colonic polyps: Secondary | ICD-10-CM | POA: Diagnosis not present

## 2015-04-04 DIAGNOSIS — Y92096 Garden or yard of other non-institutional residence as the place of occurrence of the external cause: Secondary | ICD-10-CM | POA: Insufficient documentation

## 2015-04-04 DIAGNOSIS — Z8719 Personal history of other diseases of the digestive system: Secondary | ICD-10-CM | POA: Diagnosis not present

## 2015-04-04 DIAGNOSIS — Z8701 Personal history of pneumonia (recurrent): Secondary | ICD-10-CM | POA: Diagnosis not present

## 2015-04-04 DIAGNOSIS — N182 Chronic kidney disease, stage 2 (mild): Secondary | ICD-10-CM | POA: Insufficient documentation

## 2015-04-04 DIAGNOSIS — D689 Coagulation defect, unspecified: Secondary | ICD-10-CM | POA: Diagnosis not present

## 2015-04-04 DIAGNOSIS — Z87438 Personal history of other diseases of male genital organs: Secondary | ICD-10-CM | POA: Insufficient documentation

## 2015-04-04 DIAGNOSIS — S0083XA Contusion of other part of head, initial encounter: Secondary | ICD-10-CM | POA: Diagnosis not present

## 2015-04-04 DIAGNOSIS — Z23 Encounter for immunization: Secondary | ICD-10-CM | POA: Diagnosis not present

## 2015-04-04 DIAGNOSIS — I5032 Chronic diastolic (congestive) heart failure: Secondary | ICD-10-CM | POA: Diagnosis not present

## 2015-04-04 DIAGNOSIS — S51811A Laceration without foreign body of right forearm, initial encounter: Secondary | ICD-10-CM | POA: Insufficient documentation

## 2015-04-04 DIAGNOSIS — R791 Abnormal coagulation profile: Secondary | ICD-10-CM | POA: Insufficient documentation

## 2015-04-04 DIAGNOSIS — Z794 Long term (current) use of insulin: Secondary | ICD-10-CM | POA: Diagnosis not present

## 2015-04-04 DIAGNOSIS — W07XXXA Fall from chair, initial encounter: Secondary | ICD-10-CM | POA: Insufficient documentation

## 2015-04-04 DIAGNOSIS — Z7901 Long term (current) use of anticoagulants: Secondary | ICD-10-CM | POA: Diagnosis not present

## 2015-04-04 DIAGNOSIS — I129 Hypertensive chronic kidney disease with stage 1 through stage 4 chronic kidney disease, or unspecified chronic kidney disease: Secondary | ICD-10-CM | POA: Insufficient documentation

## 2015-04-04 DIAGNOSIS — W19XXXA Unspecified fall, initial encounter: Secondary | ICD-10-CM

## 2015-04-04 DIAGNOSIS — Y9389 Activity, other specified: Secondary | ICD-10-CM | POA: Diagnosis not present

## 2015-04-04 DIAGNOSIS — Z7984 Long term (current) use of oral hypoglycemic drugs: Secondary | ICD-10-CM | POA: Insufficient documentation

## 2015-04-04 DIAGNOSIS — H9190 Unspecified hearing loss, unspecified ear: Secondary | ICD-10-CM | POA: Insufficient documentation

## 2015-04-04 DIAGNOSIS — Z7982 Long term (current) use of aspirin: Secondary | ICD-10-CM | POA: Insufficient documentation

## 2015-04-04 DIAGNOSIS — M1990 Unspecified osteoarthritis, unspecified site: Secondary | ICD-10-CM | POA: Diagnosis not present

## 2015-04-04 DIAGNOSIS — Z9861 Coronary angioplasty status: Secondary | ICD-10-CM | POA: Diagnosis not present

## 2015-04-04 DIAGNOSIS — Z9889 Other specified postprocedural states: Secondary | ICD-10-CM | POA: Insufficient documentation

## 2015-04-04 DIAGNOSIS — Z87891 Personal history of nicotine dependence: Secondary | ICD-10-CM | POA: Insufficient documentation

## 2015-04-04 DIAGNOSIS — S0093XA Contusion of unspecified part of head, initial encounter: Secondary | ICD-10-CM

## 2015-04-04 MED ORDER — TETANUS-DIPHTH-ACELL PERTUSSIS 5-2.5-18.5 LF-MCG/0.5 IM SUSP
0.5000 mL | Freq: Once | INTRAMUSCULAR | Status: AC
  Administered 2015-04-04: 0.5 mL via INTRAMUSCULAR
  Filled 2015-04-04: qty 0.5

## 2015-04-04 NOTE — Telephone Encounter (Signed)
Patient fell out of a chair & is going to the hospital.

## 2015-04-04 NOTE — Telephone Encounter (Signed)
Noted.

## 2015-04-04 NOTE — ED Notes (Addendum)
Pt fell in yard when his chair leg gave way and his has injured his right arm. Pt is on blood thinner Xarelto.  Pt has right arm wrapped. No other complaints.  Pt did hit head, but no LOC or Lacerations.  Pt is alert and oriented

## 2015-04-04 NOTE — Telephone Encounter (Signed)
FYI

## 2015-04-04 NOTE — ED Provider Notes (Addendum)
CSN: [id number]     Arrival date & time 04/04/15  1503 History   First MD Initiated Contact with Patient 04/04/15 1622     Chief Complaint  Patient presents with  . Fall     (Consider location/radiation/quality/duration/timing/severity/associated sxs/prior Treatment) HPI Joshua [name] is a 75 year old man with multiple medical problems on several toe for paroxysmal atrial fibrillation who presents today after a fall. He was outside in a plastic chair was attempting to use the chair to stand up. The chair leg broke and he fell to the ground. He has a laceration to the right forearm and struck the back of his head. He did not lose consciousness and denies any headache. He denies any other injuries. He was unable to get up by himself with his wife's help and 911 was called. They assisted him up and he opted to calm to the emergency department for assessment via private vehicle. He denies any recent illness, lightheadedness, dizziness, chest pain, or dyspnea. He states the fall occurred because of the chair breaking only. He states that he would not normally be able to get up on his own. Once he was up he denied any hip pain or difficulty walking on baseline. He is not sure when his last tetanus shot was.    Past Medical History  Diagnosis Date  . Hypertension   . Tobacco dependence in remission     100+ pack-yr hx: quit after CABG  . Obesity   . Hyperplastic colon polyp 2001  . Erectile dysfunction     Normal testosterone  . Adenomatous colon polyp 10/16/2011    Repeat 2018  . 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 pylori neg, no dysplasia or malignancy.  . Macular degeneration, dry     Mild, bilat (Optometrist, Mayford Knife at [facility] [place] in [place], [place])  . Iron deficiency anemia 2014    03/2012 capsule endoscopy showed 2 AVMs--likely responsible for his IDA--lifetime iron supp recommended + q41moCBCs.  . 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  COPD (chronic obstructive pulmonary disease) ([facility]     GOLD II.  Spirometry  2004 borderline obstruction; 2015 mod obst  . Hyperlipidemia   . Pericardial effusion with cardiac tamponade 6//29/15    pericardiocentesis was done,  Infectious/inflamm (cytology showed NO MALIGNANT CELLS)  . Other and unspecified angina pectoris   . DOE (dyspnea on exertion)     COPD + chronic diastolic HF  . Pneumonia   . DM type 2 (diabetes mellitus, type 2) (HCC)     Poor control on max oral meds--pt eventually agreed to insulin therap  . CAD, multiple vessel     3V CAD cath 08/08/13----CABG done shortly after.  . Chronic renal insufficiency, stage II (mild)   . Diabetic nephropathy (HCC)     Elevated urine microalb/cr 03/2011  . Hearing loss of both ears 2016    Hearing aids  . Chronic diastolic heart failure ([facility] 2016  . PAF (paroxysmal atrial fibrillation) ([facility] 10/2014    xarelto   Past Surgical History  Procedure Laterality Date  . Cholecystectomy open  1999  . Appendectomy  1957  . Hip surgery Right 1954    Repair of slipped capital femoral epiphysis.  . Patella fracture surgery Left ~ 1979    bolt + 3 screws to repair tib plateau fx  . Testicle surgery  as a child    Undescended testicle brought down  into scrotum  . Tonsillectomy  1947  . Colonoscopy  10/16/2011    Procedure: COLONOSCOPY;  Surgeon: Inda Castle, MD;  Location: [facility];  Service: Endoscopy;  Laterality: N/A;  . Cataract extraction w/ intraocular lens  implant, bilateral  04/08/2006 & 04/22/2006  . Esophagogastroduodenoscopy  02/25/12    Atrophic gastritis with a few erosions--capsule endoscopy planned as of 02/25/12 (Dr. Deatra Ina).  . Total hip arthroplasty Right 08/12/2012    Procedure: REMOVAL OF OLD PINS RIGHT HIP AND RIGHT TOTAL HIP ARTHROPLASTY ANTERIOR APPROACH;  Surgeon: Mcarthur Rossetti, MD;  Location: [facility];  Service: Orthopedics;  Laterality: Right;  . Hardware removal Right 08/12/2012    Procedure: HARDWARE  REMOVAL;  Surgeon: Mcarthur Rossetti, MD;  Location: [facility];  Service: Orthopedics;  Laterality: Right;  . Cardiac catheterization  08/08/2013  . Cataract extraction w/ intraocular lens  implant, bilateral Bilateral   . Coronary artery bypass graft N/A 08/14/2013    Procedure: CORONARY ARTERY BYPASS GRAFTING (CABG);  Surgeon: Melrose Nakayama, MD;  Location: [facility];  Service: Open Heart Surgery;  Laterality: N/A;  Times 4   using left internal mammary artery and endoscopically harvested bilateral saphenous vein  . Intraoperative transesophageal echocardiogram N/A 08/14/2013    Normal LV function. Procedure: INTRAOPERATIVE TRANSESOPHAGEAL ECHOCARDIOGRAM;  Surgeon: Melrose Nakayama, MD;  Location: [facility];  Service: Open Heart Surgery;  Laterality: N/A;  . Pericardial tap N/A 07/01/2013    Procedure: PERICARDIAL TAP;  Surgeon: Jettie Booze, MD;  Location: [facility] CATH LAB;  Service: Cardiovascular;  Laterality: N/A;  . Left heart catheterization with coronary angiogram N/A 08/08/2013    Procedure: LEFT HEART CATHETERIZATION WITH CORONARY ANGIOGRAM;  Surgeon: Jettie Booze, MD;  Location: [facility] CATH LAB;  Service: Cardiovascular;  Laterality: N/A;  . Transthoracic echocardiogram  10/30/14    Mod LVH, EF 60-65%, normal wall motion, mod mitral regurg, mild PAH   Family History  Problem Relation Age of Onset  . Breast cancer Sister   . Diabetes Maternal Uncle   . Diabetes Paternal Grandmother   . Heart disease Father   . Heart disease Maternal Uncle   . CVA Mother   . Heart attack Father   . Hypertension Mother   . Stroke Neg Hx    Social History  Substance Use Topics  . Smoking status: Former Smoker -- 1.00 packs/day for 62 years    Types: Cigarettes    Quit date: 10/29/2014  . Smokeless tobacco: Former Systems developer    Quit date: 06/30/2013  . Alcohol Use: No    Review of Systems  All other systems reviewed and are negative.     Allergies  Morphine and related; Demerol; and  Starlix  Home Medications   Prior to Admission medications   Medication Sig Start Date End Date Taking? Authorizing Provider  ACCU-CHEK AVIVA PLUS test strip Reported on 02/26/2015 11/28/14   Historical Provider, MD  acetaminophen (TYLENOL) 325 MG tablet Take 2 tablets (650 mg total) by mouth every 4 (four) hours as needed for headache or mild pain. 08/09/13   Erlene Quan, PA-C  albuterol (PROVENTIL HFA;VENTOLIN HFA) 108 (90 BASE) MCG/ACT inhaler Inhale 2 puffs into the lungs every 4 (four) hours as needed for wheezing or shortness of breath. 12/03/14   Deloria Lair, NP  aspirin 81 MG tablet Take 81 mg by mouth at bedtime. Reported on 02/26/2015    Historical Provider, MD  budesonide-formoterol [facility]) 160-4.5 MCG/ACT inhaler Inhale 2 puffs into the lungs  2 (two) times daily. 06/08/13   Tammi Sou, MD  buPROPion [facility] SR) 150 MG 12 hr tablet Take 1 tablet by mouth two  times daily 01/22/15   Tammi Sou, MD  chlorthalidone (HYGROTON) 25 MG tablet Take by mouth daily. Reported on 02/26/2015 12/06/12   Historical Provider, MD  Cholecalciferol (VITAMIN D-3) 1000 units CAPS Take 1,000 Units by mouth daily. Reported on 02/26/2015    Historical Provider, MD  Chromium Picolinate 1000 MCG TABS Take 1,000 mcg by mouth daily. Reported on 02/26/2015    Historical Provider, MD  citalopram (CELEXA) 40 MG tablet Take 1 tablet (40 mg total) by mouth daily. 07/23/14   Tammi Sou, MD  diltiazem (CARDIZEM CD) 240 MG 24 hr capsule Take 1 capsule (240 mg total) by mouth daily. Patient not taking: Reported on 02/26/2015 11/27/14   Isaiah Serge, NP  fluticasone [facility]) 50 MCG/ACT nasal spray Place 2 sprays into both nostrils daily. 02/06/15   Renee A Kuneff, DO  furosemide (LASIX) 40 MG tablet 20 mg daily.  01/24/15   Historical Provider, MD  glimepiride (AMARYL) 4 MG tablet Take 2 tablets by mouth  daily with breakfast 01/04/15   Tammi Sou, MD  guaiFENesin (MUCINEX) 600 MG 12 hr tablet Take  2 tablets (1,200 mg total) by mouth 2 (two) times daily. 11/02/14   Verlee Monte, MD  Insulin Detemir (LEVEMIR FLEXTOUCH) 100 UNIT/ML Pen Inject 55 Units into the  skin at bedtime. 02/25/15   Tammi Sou, MD  insulin regular (NOVOLIN R,HUMULIN R) 100 units/mL injection 8 Units SQ with BF and Lunch, 6 Units SQ with Supper 12/17/14   Tammi Sou, MD  isosorbide mononitrate (IMDUR) 30 MG 24 hr tablet Take 1 tablet by mouth  daily 01/04/15   Tammi Sou, MD  metFORMIN (GLUCOPHAGE) 1000 MG tablet Take 1 tablet (1,000 mg total) by mouth 2 (two) times daily with a meal. 01/29/15   Tammi Sou, MD  Multiple Vitamin (MULTIVITAMIN WITH MINERALS) TABS Take 1 tablet by mouth daily.    Historical Provider, MD  nitroGLYCERIN (NITROSTAT) 0.4 MG SL tablet Place 1 tablet (0.4 mg total) under the tongue every 5 (five) minutes as needed for chest pain. 01/29/15   Jettie Booze, MD  pioglitazone (ACTOS) 45 MG tablet Take by mouth daily. Reported on 02/26/2015 06/26/13   Historical Provider, MD  ramipril (ALTACE) 10 MG capsule Take 10 mg by mouth 2 (two) times daily.  09/11/14   Historical Provider, MD  rivaroxaban (XARELTO) 20 MG TABS tablet Take 1 tablet (20 mg total) by mouth daily with supper. 11/12/14   Imogene Burn, PA-C  tamsulosin (FLOMAX) 0.4 MG CAPS capsule TAKE 1 CAPSULE BY MOUTH  EVERY DAY AFTER SUPPER 01/04/15   Tammi Sou, MD   BP 119/63 mmHg  Pulse 88  Temp(Src) 98.3 F (36.8 C) (Oral)  Resp 18  SpO2 95% Physical Exam  Constitutional: He appears well-developed and well-nourished. No distress.  HENT:  Head: Normocephalic and atraumatic.  Right Ear: External ear normal.  Left Ear: External ear normal.  Nose: Nose normal.  Mouth/Throat: Oropharynx is clear and moist.  Eyes: Conjunctivae and EOM are normal. Pupils are equal, round, and reactive to light.  Neck: Normal range of motion. Neck supple.  Cardiovascular: Normal rate, regular rhythm, normal heart sounds and  intact distal pulses.   Pulmonary/Chest: Effort normal and breath sounds normal.  Abdominal: Soft. Bowel sounds are normal.  Musculoskeletal: Normal range  of motion. He exhibits no tenderness.       Arms: Skin tear right forearm grossly contaminated with dirt and grass  Skin: He is not diaphoretic.  Nursing note and vitals reviewed.   ED Course  .Marland KitchenLaceration Repair Date/Time: 04/04/2015 5:11 PM Performed by: Pattricia Boss Authorized by: Pattricia Boss Consent: Verbal consent obtained. Risks and benefits: risks, benefits and alternatives were discussed Consent given by: patient Patient identity confirmed: verbally with patient and arm band Time out: Immediately prior to procedure a "time out" was called to verify the correct patient, procedure, equipment, support staff and site/side marked as required. Body area: upper extremity Location details: right lower arm Laceration length: 12 cm Contamination: The wound is contaminated. Foreign bodies: wood Local anesthetic: lidocaine 1% with epinephrine Anesthetic total: 10 ml Irrigation solution: saline Irrigation method: syringe Debridement: moderate Skin closure: Steri-Strips Technique: simple Approximation: loose Patient tolerance: Patient tolerated the procedure well with no immediate complications Comments: Irrigated. Scrub with Betadine scrubbed to remove foreign bodies. Irrigated again and no visible foreign bodies remained. No Steri-Strips were placed as the skin was eating. Nonstick dressing is placed.   (including critical care time) Labs Review Labs Reviewed - No data to display  Imaging Review Ct Head Wo Contrast  04/04/2015  CLINICAL DATA:  75 year old male post fall. On blood thinner. Initial encounter. EXAM: CT HEAD WITHOUT CONTRAST TECHNIQUE: Contiguous axial images were obtained from the base of the skull through the vertex without intravenous contrast. COMPARISON:  11/28/2014. FINDINGS: No skull fracture or  intracranial hemorrhage. 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 Small vessel disease changes. No CT evidence of large acute infarct. Vertebral artery and carotid artery calcification. Tiny calcification left occipital region unchanged. No intracranial mass lesion noted on this unenhanced exam. Mild global atrophy without hydrocephalus. No acute orbital abnormality. Mastoid air cells and middle ear cavities are clear. 1.6 cm polypoid opacification inferior right maxillary sinus possibly a retention cyst. IMPRESSION: No skull fracture or intracranial hemorrhage. 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 Small vessel disease changes. No CT evidence of large acute infarct. 1.6 cm polypoid opacification inferior right maxillary sinus possibly a retention cyst. Electronically Signed   By: Genia Del M.D.   On: 04/04/2015 17:05   I have personally reviewed and evaluated these images and lab results as part of my medical decision-making. Patient having head CT obtained as he struck his head and is on several toe. Wound care . Plan Steri-Strips. An tetanus okay.  MDM   Final diagnoses:  Fall, initial encounter  Head contusion, initial encounter  Chronic anticoagulation  Skin tear of forearm without complication, right, initial encounter    She and family advised of need for wound recheck and return precautions and they've voiced understanding. We also discussed the head CT and a prior stroke and will follow-up with her primary care doctor.    Pattricia Boss, MD 04/04/15 [id number]  Pattricia Boss, MD 04/04/15 [phone]

## 2015-04-04 NOTE — Discharge Instructions (Signed)
Return immediately to ed if you develop headache, change in alertness, or weak on one side or the other.  Change wound icing daily. Recheck with your doctor on Monday. Return here or to your doctor if any signs or symptoms of infection are noted prior to your recheck.  Head Injury, Adult You have received a head injury. It does not appear serious at this time. Headaches and vomiting are common following head injury. It should be easy to awaken from sleeping. Sometimes it is necessary for you to stay in the emergency department for a while for observation. Sometimes admission to the hospital may be needed. After injuries such as yours, most problems occur within the first 24 hours, but side effects may occur up to 7-10 days after the injury. It is important for you to carefully monitor your condition and contact your health care provider or seek immediate medical care if there is a change in your condition. WHAT ARE THE TYPES OF HEAD INJURIES? Head injuries can be as minor as a bump. Some head injuries can be more severe. More severe head injuries include:  A jarring injury to the brain (concussion).  A bruise of the brain (contusion). This mean there is bleeding in the brain that can cause swelling.  A cracked skull (skull fracture).  Bleeding in the brain that collects, clots, and forms a bump (hematoma). WHAT CAUSES A HEAD INJURY? A serious head injury is most likely to happen to someone who is in a car wreck and is not wearing a seat belt. Other causes of major head injuries include bicycle or motorcycle accidents, sports injuries, and falls. HOW ARE HEAD INJURIES DIAGNOSED? A complete history of the event leading to the injury and your current symptoms will be helpful in diagnosing head injuries. Many times, pictures of the brain, such as CT or MRI are needed to see the extent of the injury. Often, an overnight hospital stay is necessary for observation.  WHEN SHOULD I SEEK IMMEDIATE MEDICAL  CARE?  You should get help right away if:  You have confusion or drowsiness.  You feel sick to your stomach (nauseous) or have continued, forceful vomiting.  You have dizziness or unsteadiness that is getting worse.  You have severe, continued headaches not relieved by medicine. Only take over-the-counter or prescription medicines for pain, fever, or discomfort as directed by your health care provider.  You do not have normal function of the arms or legs or are unable to walk.  You notice changes in the black spots in the center of the colored part of your eye (pupil).  You have a clear or bloody fluid coming from your nose or ears.  You have a loss of vision. During the next 24 hours after the injury, you must stay with someone who can watch you for the warning signs. This person should contact local emergency services (911 in the [place] if you have seizures, you become unconscious, or you are unable to wake up. HOW CAN I PREVENT A HEAD INJURY IN THE FUTURE? The most important factor for preventing major head injuries is avoiding motor vehicle accidents. To minimize the potential for damage to your head, it is crucial to wear seat belts while riding in motor vehicles. Wearing helmets while bike riding and playing collision sports (like football) is also helpful. Also, avoiding dangerous activities around the house will further help reduce your risk of head injury.  WHEN CAN I RETURN TO NORMAL ACTIVITIES AND ATHLETICS? You should be  reevaluated by your health care provider before returning to these activities. If you have any of the following symptoms, you should not return to activities or contact sports until 1 week after the symptoms have stopped:  Persistent headache.  Dizziness or vertigo.  Poor attention and concentration.  Confusion.  Memory problems.  Nausea or vomiting.  Fatigue or tire easily.  Irritability.  Intolerant of bright lights or loud noises.  Anxiety or  depression.  Disturbed sleep. MAKE SURE YOU:   Understand these instructions.  Will watch your condition.  Will get help right away if you are not doing well or get worse.   This information is not intended to replace advice given to you by your health care provider. Make sure you discuss any questions you have with your health care provider.   Document Released: 12/22/2004 Document Revised: 01/12/2014 Document Reviewed: 08/29/2012 Elsevier Interactive Patient Education 2016 [employer] Skin Tear Care A skin tear is a wound in which the top layer of skin has peeled off. This is a common problem with aging because the skin becomes thinner and more fragile as a person gets older. In addition, some medicines, such as oral corticosteroids, can lead to skin thinning if taken for long periods of time.  A skin tear is often repaired with tape or skin adhesive strips. This keeps the skin that has been peeled off in contact with the healthier skin beneath. Depending on the location of the wound, a bandage (dressing) may be applied over the tape or skin adhesive strips. Sometimes, during the healing process, the skin turns black and dies. Even when this happens, the torn skin acts as a good dressing until the skin underneath gets healthier and repairs itself. HOME CARE INSTRUCTIONS   Change dressings once per day or as directed by your caregiver.  Gently clean the skin tear and the area around the tear using saline solution or mild soap and water.  Do not rub the injured skin dry. Let the area air dry.  Apply petroleum jelly or an antibiotic cream or ointment to keep the tear moist. This will help the wound heal. Do not allow a scab to form.  If the dressing sticks before the next dressing change, moisten it with warm soapy water and gently remove it.  Protect the injured skin until it has healed.  Only take over-the-counter or prescription medicines as directed by your caregiver.  Take  showers or baths using warm soapy water. Apply a new dressing after the shower or bath.  Keep all follow-up appointments as directed by your caregiver.  SEEK IMMEDIATE MEDICAL CARE IF:   You have redness, swelling, or increasing pain in the skin tear.  You havepus coming from the skin tear.  You have chills.  You have a red streak that goes away from the skin tear.  You have a bad smell coming from the tear or dressing.  You have a fever or persistent symptoms for more than 2-3 days.  You have a fever and your symptoms suddenly get worse. MAKE SURE YOU:  Understand these instructions.  Will watch this condition.  Will get help right away if your child is not doing well or gets worse.   This information is not intended to replace advice given to you by your health care provider. Make sure you discuss any questions you have with your health care provider.   Document Released: 09/16/2000 Document Revised: 09/16/2011 Document Reviewed: 07/06/2011 Elsevier Interactive Patient Education 2016 [employer]vier  Inc[employer]

## 2015-04-05 NOTE — Telephone Encounter (Signed)
ER fu appt 04/08/15

## 2015-04-08 ENCOUNTER — Encounter: Payer: Self-pay | Admitting: Family Medicine

## 2015-04-08 ENCOUNTER — Other Ambulatory Visit: Payer: Self-pay | Admitting: Licensed Clinical Social Worker

## 2015-04-08 ENCOUNTER — Ambulatory Visit (INDEPENDENT_AMBULATORY_CARE_PROVIDER_SITE_OTHER): Payer: Medicare Other | Admitting: Family Medicine

## 2015-04-08 VITALS — BP 106/65 | HR 82 | Temp 97.9°F | Resp 16 | Ht 72.0 in | Wt 247.8 lb

## 2015-04-08 DIAGNOSIS — T148XXA Other injury of unspecified body region, initial encounter: Secondary | ICD-10-CM

## 2015-04-08 DIAGNOSIS — S50811D Abrasion of right forearm, subsequent encounter: Secondary | ICD-10-CM | POA: Diagnosis not present

## 2015-04-08 DIAGNOSIS — E1142 Type 2 diabetes mellitus with diabetic polyneuropathy: Secondary | ICD-10-CM

## 2015-04-08 MED ORDER — GABAPENTIN 100 MG PO CAPS: 100.0000 mg | ORAL_CAPSULE | Freq: Three times a day (TID) | ORAL | Status: DC

## 2015-04-08 NOTE — Progress Notes (Signed)
Pre visit review using our clinic review tool, if applicable. No additional management support is needed unless otherwise documented below in the visit note.

## 2015-04-08 NOTE — Progress Notes (Signed)
OFFICE VISIT  04/08/2015   CC:  Chief Complaint  Patient presents with  . Follow-up    ER visit.    HPI:    Patient is a 76 y.o. Caucasian male who presents for f/u for recent ER visit for a fall (04/04/15).  Reviewed ER record today. H was attempting to brace himself on a plastic chair to stand up and he fell and lacerated his right forearm on the ground and struck the back of his head. No LOC.  Had trouble getting up due to generalized weakness, wife couldn't help with this so EMS was called.  He went to ER where he got a head CT: no skull fracture or intracranial hemorrhage was found.  It noted a remote right caudate/genu right internal capsule infarct +small vessel dz changes.  No CT evidence of large acute infarct.  His Tdap was updated.  He had his R forearm skin tear cleaned out vigorously and the excess skin cut off.  No new meds were rx'd. He changes the dressing every day.    Also, has painful/burning neuropathic pain in both feet for a couple years, worse for the last 3 months or so.  Involves plantar surfaces of feet and extends up to mid calf.  Some numbness/tingling sensation as well in same distribution.  No sx's in arms/hands.  Past Medical History  Diagnosis Date  . Hypertension   . Tobacco dependence in remission     100+ pack-yr hx: quit after CABG  . Obesity   . Hyperplastic colon polyp 2001  . Erectile dysfunction     Normal testosterone  . Adenomatous colon polyp 10/16/2011    Repeat 2018  . 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 pylori neg, no dysplasia or malignancy.  . Macular degeneration, dry     Mild, bilat (Optometrist, Mayford Knife at [facility] [facility] [place] in [place], [place])  . Iron deficiency anemia 2014    03/2012 capsule endoscopy showed 2 AVMs--likely responsible for his IDA--lifetime iron supp recommended + q34moCBCs.  . 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 COPD (chronic obstructive pulmonary disease) ([facility]     GOLD II.  Spirometry  2004 borderline obstruction; 2015 mod obst  . Hyperlipidemia   . Pericardial effusion with cardiac tamponade 6//29/15    pericardiocentesis was done,  Infectious/inflamm (cytology showed NO MALIGNANT CELLS)  . Other and unspecified angina pectoris   . DOE (dyspnea on exertion)     COPD + chronic diastolic HF  . Pneumonia   . DM type 2 (diabetes mellitus, type 2) (HCC)     Poor control on max oral meds--pt eventually agreed to insulin therap  . CAD, multiple vessel     3V CAD cath 08/08/13----CABG done shortly after.  . Chronic renal insufficiency, stage II (mild)   . Diabetic nephropathy (HCC)     Elevated urine microalb/cr 03/2011  . Hearing loss of both ears 2016    Hearing aids  . [place] diastolic heart failure [place]  . PA[place] ([place]) [place] 10/2014    xarelto    Past Surgical History  Procedure Laterality Date  . Cholecystectomy open  1999  . Appendectomy  1957  . Hip surgery Right 1954    Repair of slipped capital femoral epiphysis.  . Patella fracture surgery Left ~ 1979    bolt + 3 screws to repair tib plateau fx  . Testicle surgery  as a  child    Undescended testicle brought down into scrotum  . Tonsillectomy  1947  . Colonoscopy  10/16/2011    Procedure: COLONOSCOPY;  Surgeon: Inda Castle, MD;  Location: [facility];  Service: Endoscopy;  Laterality: N/A;  . Cataract extraction w/ intraocular lens  implant, bilateral  04/08/2006 & 04/22/2006  . Esophagogastroduodenoscopy  02/25/12    Atrophic gastritis with a few erosions--capsule endoscopy planned as of 02/25/12 (Dr. Deatra Ina).  . Total hip arthroplasty Right 08/12/2012    Procedure: REMOVAL OF OLD PINS RIGHT HIP AND RIGHT TOTAL HIP ARTHROPLASTY ANTERIOR APPROACH;  Surgeon: Mcarthur Rossetti, MD;  Location: [facility];  Service: Orthopedics;  Laterality: Right;  . Hardware removal Right 08/12/2012    Procedure: HARDWARE REMOVAL;  Surgeon: Mcarthur Rossetti, MD;   Location: [facility];  Service: Orthopedics;  Laterality: Right;  . Cardiac catheterization  08/08/2013  . Cataract extraction w/ intraocular lens  implant, bilateral Bilateral   . Coronary artery bypass graft N/A 08/14/2013    Procedure: CORONARY ARTERY BYPASS GRAFTING (CABG);  Surgeon: Melrose Nakayama, MD;  Location: [facility];  Service: Open Heart Surgery;  Laterality: N/A;  Times 4   using left internal mammary artery and endoscopically harvested bilateral saphenous vein  . Intraoperative transesophageal echocardiogram N/A 08/14/2013    Normal LV function. Procedure: INTRAOPERATIVE TRANSESOPHAGEAL ECHOCARDIOGRAM;  Surgeon: Melrose Nakayama, MD;  Location: [facility];  Service: Open Heart Surgery;  Laterality: N/A;  . Pericardial tap N/A 07/01/2013    Procedure: PERICARDIAL TAP;  Surgeon: Jettie Booze, MD;  Location: [facility] CATH LAB;  Service: Cardiovascular;  Laterality: N/A;  . Left heart catheterization with coronary angiogram N/A 08/08/2013    Procedure: LEFT HEART CATHETERIZATION WITH CORONARY ANGIOGRAM;  Surgeon: Jettie Booze, MD;  Location: [facility] C[facility] LAB;  Service: Cardiovascular;  Laterality: N/A;  . Transthoracic echocardiogram  10/30/14    Mod LVH, EF 60-65%, normal wall motion, mod mitral regurg, mild PAH    Outpatient Prescriptions Prior to Visit  Medication Sig Dispense Refill  . ACCU-CHEK AVIVA PLUS test strip Reported on 02/26/2015    . acetaminophen (TYLENOL) 325 MG tablet Take 2 tablets (650 mg total) by mouth every 4 (four) hours as needed for headache or mild pain.    [name] albuterol (PROVENTIL HFA;VENTOLIN HFA) 108 (90 BASE) MCG/ACT inhaler Inhale 2 puffs into the lungs every 4 (four) hours as needed for wheezing or shortness of breath. 1 Inhaler 2  . aspirin 81 MG tablet Take 81 mg by mouth at bedtime. Reported on 02/26/2015    . budesonide-formoterol (SYMBICORT) 160-4.5 MCG/ACT inhaler Inhale 2 puffs into the lungs 2 (two) times daily. 1 Inhaler 12  . buPROPion (WELLBUTRIN SR)  150 MG 12 hr tablet Take 1 tablet by mouth two  times daily 30 tablet 0  . chlorthalidone (HYGROTON) 25 MG tablet Take by mouth daily. Reported on 02/26/2015    . Cholecalciferol (VITAMIN D-3) 1000 units CAPS Take 1,000 Units by mouth daily. Reported on 02/26/2015    . Chromium Picolinate 1000 MCG TABS Take 1,000 mcg by mouth daily. Reported on 02/26/2015    . citalopram (CELEXA) 40 MG tablet Take 1 tablet (40 mg total) by mouth daily. 90 tablet 3  . diltiazem (CARDIZEM CD) 240 MG 24 hr capsule Take 1 capsule (240 mg total) by mouth daily. 30 capsule 1  . fluticasone (FLONASE) 50 MCG/ACT nasal spray Place 2 sprays into both nostrils daily. 16 g 0  . furosemide (LASIX) 40 MG  tablet 20 mg daily.     [name] glimepiride (AMARYL) 4 MG tablet Take 2 tablets by mouth  daily with breakfast 180 tablet 1  . guaiFENesin (MUCINEX) 600 MG 12 hr tablet Take 2 tablets (1,200 mg total) by mouth 2 (two) times daily. 30 tablet 0  . Insulin Detemir (LEVEMIR FLEXTOUCH) 100 UNIT/ML Pen Inject 55 Units into the  skin at bedtime. 45 mL 3  . insulin regular (NOVOLIN R,HUMULIN R) 100 units/mL injection 8 Units SQ with BF and Lunch, 6 Units SQ with Supper 10 mL 6  . isosorbide mononitrate (IMDUR) 30 MG 24 hr tablet Take 1 tablet by mouth  daily 90 tablet 3  . metFORMIN (GLUCOPHAGE) 1000 MG tablet Take 1 tablet (1,000 mg total) by mouth 2 (two) times daily with a meal. 180 tablet 1  . Multiple Vitamin (MULTIVITAMIN WITH MINERALS) TABS Take 1 tablet by mouth daily.    . nitroGLYCERIN (NITROSTAT) 0.4 MG SL tablet Place 1 tablet (0.4 mg total) under the tongue every 5 (five) minutes as needed for chest pain. 50 tablet 3  . pioglitazone (ACTOS) 45 MG tablet Take by mouth daily. Reported on 02/26/2015    . ramipril (ALTACE) 10 MG capsule Take 10 mg by mouth 2 (two) times daily.     . rivaroxaban (XARELTO) 20 MG TABS tablet Take 1 tablet (20 mg total) by mouth daily with supper. 90 tablet 4  . tamsulosin (FLOMAX) 0.4 MG CAPS capsule TAKE  1 CAPSULE BY MOUTH  EVERY DAY AFTER SUPPER 90 capsule 1   No facility-administered medications prior to visit.    Allergies  Allergen Reactions  . Morphine And Related Other (See Comments)    Drenched with perspiration  . Demerol [Meperidine] Nausea Only  . Starlix [Nateglinide] Other (See Comments)    gassy    ROS As per HPI  PE: Blood pressure 106/65, pulse 82, temperature 97.9 F (36.6 C), temperature source Oral, resp. rate 16, height 6' (1.829 m), weight 247 lb 12 oz (112.379 kg), SpO2 96 %. R prox forearm with a patch of superficial abrasion with no erythema or exudate or foul odor.  Minimal tenderness.  No areas of fluctuance, no streaking, no surrounding erythema. The wound is very clean.  LABS:  None today Lab Results  Component Value Date   HGBA1C 7.9* 02/18/2015    IMPRESSION AND PLAN:  1) Skin tear/abrasion, s/p fall.  Wound looks excellent. Continue current wound care. His fall did not result in any further injury.  2) Diabetic peripheral neuropathy: start gabapentin trial at 166m, start with hs dose and titrate over the course of a week to 1027mtid dosing.    An After Visit Summary was printed and given to the patient.  FOLLOW UP: Return for keep f/u appt set for May 2017.  Signed:  PhCrissie SicklesMD           04/08/2015

## 2015-04-08 NOTE — Patient Outreach (Signed)
Assessment:  CSW spoke via phone with [name], spouse of client, on 04/08/15. CSW verified identity of Britten Seyfried. [name] and CSW spoke of client needs. [name] said client had fallen last week and had gone to emergency room for evaluation.  She said that client returned home from the emergency room (the same day) after evaluation. She said that client had prescribed medications and was taking medications as prescribed.  She said that client was eating well and sleeping well.  CSW and [name] spoke of client care plan. CSW encouraged that client attend all scheduled clientt medical appointments in the next 30 days. CSW encouraged for client to continue to use relaxation techniques to help client manage anxiety and stress symptoms. [name] said that client has an appointment today with his primary doctor, Dr. Ricardo Jericho.  [name] said that client has an appointment with a pulmonologist on 05/10/15.  CSW reminded [name] that [facility] program support was free to client since client was a patient of Dr. Ricardo Jericho. CSW encouraged for client or Wray Goehring to call CSW at [phone] as needed to discuss social work needs of client. CSW thanked [employer] for phone conversation with CSW on 04/08/15.  Plan: Client to attend all scheduled client medical appointments in the next 30 days. Client to continue to use relaxation techniques in next 30 days to help client manage depression symptoms. CSW to call client in 4 weeks to assess client needs.   [name].Moranda Billiot MSW, LCSW Licensed Clinical Social Worker [facility] Care Management [phone]

## 2015-04-24 ENCOUNTER — Other Ambulatory Visit: Payer: Self-pay | Admitting: *Deleted

## 2015-04-24 MED ORDER — TAMSULOSIN HCL 0.4 MG PO CAPS: ORAL_CAPSULE | ORAL | Status: DC

## 2015-04-24 NOTE — Telephone Encounter (Signed)
RF request for tamsulosin LOV: 02/18/15 Next ov: 05/17/15 Last written: 01/04/15 #90 w/ 6XF

## 2015-05-07 ENCOUNTER — Other Ambulatory Visit: Payer: Self-pay | Admitting: Licensed Clinical Social Worker

## 2015-05-07 NOTE — Patient Outreach (Signed)
Assessment:  CSW spoke via phone with Joshua [name], spouse of client, on 05/07/15. CSW verified identity of Joshua [name]. Joshua [name] and CSW spoke of client needs.  Client has prescribed medications and is taking medications as prescribed.  Client uses a cane to assist in ambulation. Client is eating well and sleeping well. Client has been able to drive himself to needed medical appointments scheduled for client. Client also enjoys socializing with friends and neighbors.  He enjoys going to breakfast with friends periodically. Client has appointment scheduled with pulmonologist for 05/13/15. Client sees Dr. Anitra Lauth as primary doctor.   CSW and Joshua [name] spoke of client care plan. CSW encouraged for client to continue to use relaxation techniques in next 30 days to help client manage depression symptoms of client.  Joshua [name] said that client is more confused recently with details and dates. She said he is getting lost occasionally when driving. CSW encouraged Joshua [name] to call RN at office of Dr. Anitra Lauth to inform RN of current client symptoms experienced and ask RN to share information on client symptoms with Dr. Anitra Lauth.  CSW thanked [name] for phone conversation with CSW on 05/07/15. CSW encouraged client/Joshua [name] to call CSW at [phone] as needed to discuss social work needs of client.     Plan:  Client will use relaxation techniques in next 30 days to help client manage depression symptoms. CSW to call client/Joshua [name] Joshua [name] in 4 weeks to assess client needs.   Joshua [name].Joshua [name] MSW, LCSW Licensed Clinical Social Worker [facility] Care Management [phone]

## 2015-05-13 ENCOUNTER — Encounter: Payer: Self-pay | Admitting: Pulmonary Disease

## 2015-05-13 ENCOUNTER — Ambulatory Visit (INDEPENDENT_AMBULATORY_CARE_PROVIDER_SITE_OTHER): Payer: Medicare Other | Admitting: Pulmonary Disease

## 2015-05-13 VITALS — BP 138/64 | HR 78 | Ht 71.5 in | Wt 247.8 lb

## 2015-05-13 DIAGNOSIS — J439 Emphysema, unspecified: Secondary | ICD-10-CM

## 2015-05-13 DIAGNOSIS — Z77090 Contact with and (suspected) exposure to asbestos: Secondary | ICD-10-CM

## 2015-05-13 NOTE — Progress Notes (Signed)
Subjective:    Patient ID: Joshua [name], male    DOB: [birth year], 75 y.o.   MRN: [id number]  PROBLEM LIST: COPD Asbestos exposure  HPI Mr. Khawaja is a 75 year old with history of COPD. He was hospitalized in October 2016 with acute on chronic respiratory failure secondary to diastolic heart failure, COPD exacerbation. Since his discharge he has been doing well. He had an ER visit in March 2017 for a fall and forearm laceration.   He is a heavy smoker he smoked about 3 packs per day for 40 years. He quit last month after this hospitalization. He worked as a Advertising account planner. He reports exposure to asbestos for about a year to 2 years between 1958 and 1959. He's had spirometry in 2004 which showed borderline obstruction. Repeat pulmonary function tests in 2015 shows moderate obstruction. He's been maintained on Symbicort for many years. He does not take it on a regular basis and skips some days. He reports that he has dyspnea on exertion. No cough, sputum production, fevers, chills.  DATA: CT chest [07/02/13] There are small, layering bilateral pleural effusions. Bilateral calcified pleural plaques with a Deep pendant and basilar predominance. No concerning pleural nodularity. There is dependent atelectasis. Subpleural ground-glass density at the right apex measuring up to 2.4 cm. No evidence of suspicious, solid, nodule.  PFTs [08/09/13] FVC 3.24 [74%] FEV1 2.12 [65%] F/F 65 TLC 79 DLCO 71. Moderate obstruction, no significant bronchodilator response. Mild restrictive lung disease Decreased diffusion capacity that corrects when adjusted for alveolar volume.  Chest x-ray [10/29/14] Mild CHF  Echo [10/30/14] - Left ventricle: The cavity size was normal. Wall thickness was increased in a pattern of moderate LVH. Systolic function was normal. The estimated ejection fraction was in the range of 60% to 65%. Wall motion was normal; there were no regional wall motion  abnormalities. - Mitral valve: Moderately calcified annulus. There was moderate regurgitation. - Right atrium: The atrium was mildly dilated. - Pulmonary arteries: Systolic pressure was mildly increased. PA peak pressure: 38 mm Hg (S).  Past Medical History  Diagnosis Date  . Hypertension   . Tobacco dependence in remission     100+ pack-yr hx: quit after CABG  . Obesity   . Hyperplastic colon polyp 2001  . Erectile dysfunction     Normal testosterone  . Adenomatous colon polyp 10/16/2011    Repeat 2018  . 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 pylori neg, no dysplasia or malignancy.  . Macular degeneration, dry     Mild, bilat (Optometrist, Mayford Knife at [facility] [facility] [place] in [place], [place])  . Iron deficiency anemia 2014    03/2012 capsule endoscopy showed 2 AVMs--likely responsible for his IDA--lifetime iron supp recommended + q37moCBCs.  . 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 COPD [place] disease) ([place]     GOLD II.  Spirometry  2004 borderline obstruction; 2015 mod obst  . Hyperlipidemia   . Pericardial effusion with cardiac tamponade 6//29/15    pericardiocentesis was done,  Infectious/inflamm (cytology showed NO MALIGNANT CELLS)  . Other and unspecified angina pectoris   . DOE (dyspnea on exertion)     COPD + chronic diastolic HF  . Pneumonia   . DM type 2 (diabetes mellitus, type 2) (HCC)     Poor control on max oral meds--pt eventually agreed to insulin therap  . CAD, multiple vessel  3V CAD cath 08/08/13----CABG done shortly after.  . Chronic renal insufficiency, stage II (mild)   . Diabetic nephropathy (HCC)     Elevated urine microalb/cr 03/2011  . Hearing loss of both ears 2016    Hearing aids  . Chronic diastolic heart failure ([place]) 2016  . PAF (paroxysmal atrial fibrillation) ([place]) 10/2014    xarelto    Current outpatient prescriptions:  .  ACCU-CHEK AVIVA PLUS test strip, Reported on  02/26/2015, Disp: , Rfl:  .  acetaminophen (TYLENOL) 325 MG tablet, Take 2 tablets (650 mg total) by mouth every 4 (four) hours as needed for headache or mild pain., Disp: , Rfl:  .  albuterol (PROVENTIL HFA;VENTOLIN HFA) 108 (90 BASE) MCG/ACT inhaler, Inhale 2 puffs into the lungs every 4 (four) hours as needed for wheezing or shortness of breath., Disp: 1 Inhaler, Rfl: 2 .  aspirin 81 MG tablet, Take 81 mg by mouth at bedtime. Reported on 02/26/2015, Disp: , Rfl:  .  budesonide-formoterol (SYMBICORT) 160-4.5 MCG/ACT inhaler, Inhale 2 puffs into the lungs 2 (two) times daily., Disp: 1 Inhaler, Rfl: 12 .  buPROPion (WELLBUTRIN SR) 150 MG 12 hr tablet, Take 1 tablet by mouth two  times daily, Disp: 30 tablet, Rfl: 0 .  chlorthalidone (HYGROTON) 25 MG tablet, Take by mouth daily. Reported on 02/26/2015, Disp: , Rfl:  .  Cholecalciferol (VITAMIN D-3) 1000 units CAPS, Take 1,000 Units by mouth daily. Reported on 02/26/2015, Disp: , Rfl:  .  Chromium Picolinate 1000 MCG TABS, Take 1,000 mcg by mouth daily. Reported on 02/26/2015, Disp: , Rfl:  .  citalopram (CELEXA) 40 MG tablet, Take 1 tablet (40 mg total) by mouth daily., Disp: 90 tablet, Rfl: 3 .  diltiazem (CARDIZEM CD) 240 MG 24 hr capsule, Take 1 capsule (240 mg total) by mouth daily., Disp: 30 capsule, Rfl: 1 .  fluticasone (FLONASE) 50 MCG/ACT nasal spray, Place 2 sprays into both nostrils daily., Disp: 16 g, Rfl: 0 .  furosemide (LASIX) 40 MG tablet, 20 mg daily. , Disp: , Rfl:  .  gabapentin (NEURONTIN) 100 MG capsule, Take 1 capsule (100 mg total) by mouth 3 (three) times daily., Disp: 90 capsule, Rfl: 1 .  glimepiride (AMARYL) 4 MG tablet, Take 2 tablets by mouth  daily with breakfast, Disp: 180 tablet, Rfl: 1 .  guaiFENesin (MUCINEX) 600 MG 12 hr tablet, Take 2 tablets (1,200 mg total) by mouth 2 (two) times daily., Disp: 30 tablet, Rfl: 0 .  Insulin Detemir (LEVEMIR FLEXTOUCH) 100 UNIT/ML Pen, Inject 55 Units into the  skin at bedtime., Disp: 45  mL, Rfl: 3 .  insulin regular (NOVOLIN R,HUMULIN R) 100 units/mL injection, 8 Units SQ with BF and Lunch, 6 Units SQ with Supper, Disp: 10 mL, Rfl: 6 .  isosorbide mononitrate (IMDUR) 30 MG 24 hr tablet, Take 1 tablet by mouth  daily, Disp: 90 tablet, Rfl: 3 .  metFORMIN (GLUCOPHAGE) 1000 MG tablet, Take 1 tablet (1,000 mg total) by mouth 2 (two) times daily with a meal., Disp: 180 tablet, Rfl: 1 .  Multiple Vitamin (MULTIVITAMIN WITH MINERALS) TABS, Take 1 tablet by mouth daily., Disp: , Rfl:  .  nitroGLYCERIN (NITROSTAT) 0.4 MG SL tablet, Place 1 tablet (0.4 mg total) under the tongue every 5 (five) minutes as needed for chest pain., Disp: 50 tablet, Rfl: 3 .  pioglitazone (ACTOS) 45 MG tablet, Take by mouth daily. Reported on 02/26/2015, Disp: , Rfl:  .  ramipril (ALTACE) 10 MG capsule, Take 10  mg by mouth 2 (two) times daily. , Disp: , Rfl:  .  rivaroxaban (XARELTO) 20 MG TABS tablet, Take 1 tablet (20 mg total) by mouth daily with supper., Disp: 90 tablet, Rfl: 4 .  tamsulosin (FLOMAX) 0.4 MG CAPS capsule, TAKE 1 CAPSULE BY MOUTH  EVERY DAY AFTER SUPPER, Disp: 30 capsule, Rfl: 6   Review of Systems  Has dyspnea on exertion, denies any cough, wheezing, sputum production, hemoptysis. Denies any fevers, chills, loss of weight, loss of appetite. Denies any chest pain, palpitations, lower extremity edema. Denies any nausea, vomiting, diarrhea, constipation. All other review of systems are negative    Objective:   Physical Exam  Blood pressure 138/64, pulse 78, height 5' 11.5" (1.816 m), weight 247 lb 12.8 oz (112.401 kg), SpO2 94 %. Gen: No apparent distress Neuro: No gross focal deficits. Neck: No JVD, lymphadenopathy, thyromegaly. RS: Clear, no wheeze or crackles. CVS: S1-S2 heard, no murmurs rubs gallops. Abdomen: Soft, positive bowel sounds. Extremities: No edema.    Assessment & Plan:  #1 Moderate COPD,GOLD Class II. He is doing OK on the Symbicort but still has some DOE. He does  not take the inhaler regularly. I stressed the importance of using it everyday. We discussed adding additional inhalers such as Spiriva. However I wold like to optimize his current inhaler use before adding another. He remains off cigarettes since Oct 2016.  #2 Asbestos exposure He has Asbestos exposure in the 1950s. He feels that this exposure not considerable however he has calcified pleural plaques on CT scan. There is no evidence of pulmonary fibrosis resulting from asbestos. We'll continue to monitor symptoms and that annual imaging with chest x-ray and regular PFTs.  Plan: -Continue Symbicort. - Monitor lung function with annual PFTs and chest x-ray  Return to clinic in 6 months.   Marshell Garfinkel MD [facility] Pager [phone] If no answer or after 3pm call: [phone] 05/13/2015, 9:22 AM

## 2015-05-13 NOTE — Patient Instructions (Signed)
Continue using the symbicort as directed. Take 2 puffs twice daily.. Return to clinic in 6 months

## 2015-05-14 ENCOUNTER — Telehealth: Payer: Self-pay | Admitting: Pulmonary Disease

## 2015-05-14 NOTE — Telephone Encounter (Signed)
lmtcb x1 for pt's wife. 

## 2015-05-15 NOTE — Telephone Encounter (Signed)
LMTCB

## 2015-05-16 NOTE — Telephone Encounter (Signed)
lmomtcb  

## 2015-05-17 ENCOUNTER — Ambulatory Visit (INDEPENDENT_AMBULATORY_CARE_PROVIDER_SITE_OTHER): Payer: Medicare Other | Admitting: Family Medicine

## 2015-05-17 ENCOUNTER — Encounter: Payer: Self-pay | Admitting: Family Medicine

## 2015-05-17 VITALS — BP 126/68 | HR 77 | Temp 98.2°F | Resp 16 | Ht 71.5 in | Wt 249.5 lb

## 2015-05-17 DIAGNOSIS — R197 Diarrhea, unspecified: Secondary | ICD-10-CM

## 2015-05-17 DIAGNOSIS — D509 Iron deficiency anemia, unspecified: Secondary | ICD-10-CM

## 2015-05-17 DIAGNOSIS — E1142 Type 2 diabetes mellitus with diabetic polyneuropathy: Secondary | ICD-10-CM

## 2015-05-17 DIAGNOSIS — E118 Type 2 diabetes mellitus with unspecified complications: Secondary | ICD-10-CM | POA: Diagnosis not present

## 2015-05-17 DIAGNOSIS — J069 Acute upper respiratory infection, unspecified: Secondary | ICD-10-CM | POA: Diagnosis not present

## 2015-05-17 DIAGNOSIS — I10 Essential (primary) hypertension: Secondary | ICD-10-CM | POA: Diagnosis not present

## 2015-05-17 LAB — HEMOGLOBIN A1C: Hgb A1c MFr Bld: 8.8 % — ABNORMAL HIGH (ref 4.6–6.5)

## 2015-05-17 LAB — BASIC METABOLIC PANEL
BUN: 21 mg/dL (ref 6–23)
CO2: 24 meq/L (ref 19–32)
Calcium: 9 mg/dL (ref 8.4–10.5)
Chloride: 104 mEq/L (ref 96–112)
Creatinine, Ser: 0.95 mg/dL (ref 0.40–1.50)
GFR: 81.91 mL/min (ref >60.00)
GLUCOSE: 269 mg/dL — AB (ref 70–99)
POTASSIUM: 5.2 meq/L — AB (ref 3.5–5.1)
Sodium: 136 mEq/L (ref 135–145)

## 2015-05-17 LAB — CBC WITH DIFFERENTIAL/PLATELET
BASOS ABS: 0 10*3/uL (ref 0.0–0.1)
BASOS PCT: 0.4 % (ref 0.0–3.0)
Eosinophils Absolute: 0.1 10*3/uL (ref 0.0–0.7)
Eosinophils Relative: 1.5 % (ref 0.0–5.0)
HEMATOCRIT: 37.6 % — AB (ref 39.0–52.0)
HEMOGLOBIN: 12.2 g/dL — AB (ref 13.0–17.0)
LYMPHS PCT: 20.7 % (ref 12.0–46.0)
Lymphs Abs: 1.5 10*3/uL (ref 0.7–4.0)
MCHC: 32.5 g/dL (ref 30.0–36.0)
MCV: 81.3 fl (ref 78.0–100.0)
Monocytes Absolute: 0.5 10*3/uL (ref 0.1–1.0)
Monocytes Relative: 7.3 % (ref 3.0–12.0)
NEUTROS ABS: 5 10*3/uL (ref 1.4–7.7)
Neutrophils Relative %: 70.1 % (ref 43.0–77.0)
PLATELETS: 229 10*3/uL (ref 150.0–400.0)
RBC: 4.63 Mil/uL (ref 4.22–5.81)
RDW: 16.6 % — ABNORMAL HIGH (ref 11.5–15.5)
WBC: 7.1 10*3/uL (ref 4.0–10.5)

## 2015-05-17 MED ORDER — BUDESONIDE-FORMOTEROL FUMARATE 160-4.5 MCG/ACT IN AERO: 2.0000 | INHALATION_SPRAY | Freq: Two times a day (BID) | RESPIRATORY_TRACT | Status: DC

## 2015-05-17 MED ORDER — ALBUTEROL SULFATE HFA 108 (90 BASE) MCG/ACT IN AERS: 2.0000 | INHALATION_SPRAY | Freq: Four times a day (QID) | RESPIRATORY_TRACT | Status: DC | PRN

## 2015-05-17 MED ORDER — GABAPENTIN 100 MG PO CAPS: ORAL_CAPSULE | ORAL | Status: DC

## 2015-05-17 MED ORDER — FLUTICASONE PROPIONATE 50 MCG/ACT NA SUSP: 2.0000 | Freq: Every day | NASAL | Status: DC

## 2015-05-17 MED ORDER — ALBUTEROL SULFATE HFA 108 (90 BASE) MCG/ACT IN AERS: 2.0000 | INHALATION_SPRAY | RESPIRATORY_TRACT | Status: DC | PRN

## 2015-05-17 NOTE — Progress Notes (Signed)
Pre visit review using our clinic review tool, if applicable. No additional management support is needed unless otherwise documented below in the visit note.

## 2015-05-17 NOTE — Progress Notes (Addendum)
OFFICE VISIT  05/17/2015   CC:  Chief Complaint  Patient presents with  . Follow-up    Pt is not fasting.    HPI:    Patient is a 75 y.o. Caucasian male who presents for 3 mo f/u HTN, DM 2, and diabetic PN pain.. 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 He is on xarelto for PAF, also has CAD and is s/p CABG.  Has preserved LV EF but hx of diastolic HF.  Overall says he is feeling ok.  DM 2: last visit we increased his mealtime insulin to 10-14-08.  Compliant with levemir, actos, and metformin. Glucoses have improved a little, he says he still cheats on diet. We also started neurontin 100 mg tid for his LE PN sx's and he feels like this has helped some but he and wife think dose needs to be increased.    BP: home monitoring shows 120s/70.  Having some loose BMs on and off.  No fevers or abd pain.  No blood or pus in stool.    Past Medical History  Diagnosis Date  . Hypertension   . Tobacco dependence in remission     100+ pack-yr hx: quit after CABG  . Obesity   . Hyperplastic colon polyp 2001  . Erectile dysfunction     Normal testosterone  . Adenomatous colon polyp 10/16/2011    Repeat 2018  . 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 pylori neg, no dysplasia or malignancy.  . Macular degeneration, dry     Mild, bilat (Optometrist, Mayford Knife at [facility] [facility] [place] in [place], [place])  . Iron deficiency anemia 2014    03/2012 capsule endoscopy showed 2 AVMs--likely responsible for his IDA--lifetime iron supp recommended + q77moCBCs.  . 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 COPD (chronic obstructive pulmonary disease) ([facility]     GOLD II.  Spirometry  2004 borderline obstruction; 2015 mod obst  . Hyperlipidemia   . Pericardial effusion with cardiac tamponade 6//29/15    pericardiocentesis was done,  Infectious/inflamm (cytology showed NO MALIGNANT  CELLS)  . Other and unspecified angina pectoris   . DOE (dyspnea on exertion)     COPD + chronic diastolic HF  . Pneumonia   . DM type 2 (diabetes mellitus, type 2) (HCC)     Poor control on max oral meds--pt eventually agreed to insulin therap  . CAD, multiple vessel     3V CAD cath 08/08/13----CABG done shortly after.  . Chronic renal insufficiency, stage II (mild)   . Diabetic nephropathy (HCC)     Elevated urine microalb/cr 03/2011  . Hearing loss of both ears 2016    Hearing aids  . Chronic diastolic heart failure ([facility] 2016  . PAF (paroxysmal atrial fibrillation) ([facility] 10/2014    xarelto    Past Surgical History  Procedure Laterality Date  . Cholecystectomy open  1999  . Appendectomy  1957  . Hip surgery Right 1954    Repair of slipped capital femoral epiphysis.  . Patella fracture surgery Left ~ 1979    bolt + 3 screws to repair tib plateau fx  . Testicle surgery  as a child    Undescended testicle brought down into scrotum  . Tonsillectomy  1947  . Colonoscopy  10/16/2011    Procedure: COLONOSCOPY;  Surgeon: RInda Castle  MD;  Location: [facility];  Service: Endoscopy;  Laterality: N/A;  . Cataract extraction w/ intraocular lens  implant, bilateral  04/08/2006 & 04/22/2006  . Esophagogastroduodenoscopy  02/25/12    Atrophic gastritis with a few erosions--capsule endoscopy planned as of 02/25/12 (Dr. Deatra Ina).  . Total hip arthroplasty Right 08/12/2012    Procedure: REMOVAL OF OLD PINS RIGHT HIP AND RIGHT TOTAL HIP ARTHROPLASTY ANTERIOR APPROACH;  Surgeon: Mcarthur Rossetti, MD;  Location: [facility];  Service: Orthopedics;  Laterality: Right;  . Hardware removal Right 08/12/2012    Procedure: HARDWARE REMOVAL;  Surgeon: Mcarthur Rossetti, MD;  Location: [facility];  Service: Orthopedics;  Laterality: Right;  . Cardiac catheterization  08/08/2013  . Cataract extraction w/ intraocular lens  implant, bilateral Bilateral   . Coronary artery bypass graft N/A 08/14/2013     Procedure: CORONARY ARTERY BYPASS GRAFTING (CABG);  Surgeon: Melrose Nakayama, MD;  Location: [facility];  Service: Open Heart Surgery;  Laterality: N/A;  Times 4   using left internal mammary artery and endoscopically harvested bilateral saphenous vein  . Intraoperative transesophageal echocardiogram N/A 08/14/2013    Normal LV function. Procedure: INTRAOPERATIVE TRANSESOPHAGEAL ECHOCARDIOGRAM;  Surgeon: Melrose Nakayama, MD;  Location: [facility];  Service: Open Heart Surgery;  Laterality: N/A;  . Pericardial tap N/A 07/01/2013    Procedure: PERICARDIAL TAP;  Surgeon: Jettie Booze, MD;  Location: [facility] CATH LAB;  Service: Cardiovascular;  Laterality: N/A;  . Left heart catheterization with coronary angiogram N/A 08/08/2013    Procedure: LEFT HEART CATHETERIZATION WITH CORONARY ANGIOGRAM;  Surgeon: Jettie Booze, MD;  Location: [facility] C[facility] LAB;  Service: Cardiovascular;  Laterality: N/A;  . Transthoracic echocardiogram  10/30/14    Mod LVH, EF 60-65%, normal wall motion, mod mitral regurg, mild PAH    Outpatient Prescriptions Prior to Visit  Medication Sig Dispense Refill  . ACCU-CHEK AVIVA PLUS test strip Reported on 02/26/2015    . acetaminophen (TYLENOL) 325 MG tablet Take 2 tablets (650 mg total) by mouth every 4 (four) hours as needed for headache or mild pain.    [name] aspirin 81 MG tablet Take 81 mg by mouth at bedtime. Reported on 02/26/2015    . buPROPion (WELLBUTRIN SR) 150 MG 12 hr tablet Take 1 tablet by mouth two  times daily 30 tablet 0  . chlorthalidone (HYGROTON) 25 MG tablet Take by mouth daily. Reported on 02/26/2015    . Cholecalciferol (VITAMIN D-3) 1000 units CAPS Take 1,000 Units by mouth daily. Reported on 02/26/2015    . Chromium Picolinate 1000 MCG TABS Take 1,000 mcg by mouth daily. Reported on 02/26/2015    . citalopram (CELEXA) 40 MG tablet Take 1 tablet (40 mg total) by mouth daily. 90 tablet 3  . diltiazem (CARDIZEM CD) 240 MG 24 hr capsule Take 1 capsule (240 mg total) by  mouth daily. 30 capsule 1  . furosemide (LASIX) 40 MG tablet 20 mg daily.     [name] glimepiride (AMARYL) 4 MG tablet Take 2 tablets by mouth  daily with breakfast 180 tablet 1  . guaiFENesin (MUCINEX) 600 MG 12 hr tablet Take 2 tablets (1,200 mg total) by mouth 2 (two) times daily. 30 tablet 0  . Insulin Detemir (LEVEMIR FLEXTOUCH) 100 UNIT/ML Pen Inject 55 Units into the  skin at bedtime. 45 mL 3  . insulin regular (NOVOLIN R,HUMULIN R) 100 units/mL injection 8 Units SQ with BF and Lunch, 6 Units SQ with Supper 10 mL 6  . isosorbide mononitrate (  IMDUR) 30 MG 24 hr tablet Take 1 tablet by mouth  daily 90 tablet 3  . metFORMIN (GLUCOPHAGE) 1000 MG tablet Take 1 tablet (1,000 mg total) by mouth 2 (two) times daily with a meal. 180 tablet 1  . Multiple Vitamin (MULTIVITAMIN WITH MINERALS) TABS Take 1 tablet by mouth daily.    . nitroGLYCERIN (NITROSTAT) 0.4 MG SL tablet Place 1 tablet (0.4 mg total) under the tongue every 5 (five) minutes as needed for chest pain. 50 tablet 3  . pioglitazone (ACTOS) 45 MG tablet Take by mouth daily. Reported on 02/26/2015    . ramipril (ALTACE) 10 MG capsule Take 10 mg by mouth 2 (two) times daily.     . rivaroxaban (XARELTO) 20 MG TABS tablet Take 1 tablet (20 mg total) by mouth daily with supper. 90 tablet 4  . tamsulosin (FLOMAX) 0.4 MG CAPS capsule TAKE 1 CAPSULE BY MOUTH  EVERY DAY AFTER SUPPER 30 capsule 6  . albuterol (PROVENTIL HFA;VENTOLIN HFA) 108 (90 BASE) MCG/ACT inhaler Inhale 2 puffs into the lungs every 4 (four) hours as needed for wheezing or shortness of breath. 1 Inhaler 2  . budesonide-formoterol (SYMBICORT) 160-4.5 MCG/ACT inhaler Inhale 2 puffs into the lungs 2 (two) times daily. 1 Inhaler 12  . fluticasone (FLONASE) 50 MCG/ACT nasal spray Place 2 sprays into both nostrils daily. 16 g 0  . gabapentin (NEURONTIN) 100 MG capsule Take 1 capsule (100 mg total) by mouth 3 (three) times daily. 90 capsule 1   No facility-administered medications prior to  visit.    Allergies  Allergen Reactions  . Morphine And Related Other (See Comments)    Drenched with perspiration  . Demerol [Meperidine] Nausea Only  . Starlix [Nateglinide] Other (See Comments)    gassy    ROS As per HPI  PE: Blood pressure 126/68, pulse 77, temperature 98.2 F (36.8 C), temperature source Oral, resp. rate 16, height 5' 11.5" (1.816 m), weight 249 lb 8 oz (113.172 kg), SpO2 94 %. Gen: Alert, well appearing.  Patient is oriented to person, place, time, and situation. AFFECT: pleasant, lucid thought and speech. No further exam today.  LABS:  Lab Results  Component Value Date   TSH 0.918 07/01/2013   Lab Results  Component Value Date   WBC 7.3 12/01/2014   HGB 10.6* 12/01/2014   HCT 33.1* 12/01/2014   MCV 83.0 12/01/2014   PLT 188 12/01/2014   Lab Results  Component Value Date   CREATININE 1.17 02/25/2015   BUN 27* 02/25/2015   NA 136 02/25/2015   K 5.1 02/25/2015   CL 102 02/25/2015   CO2 23 02/25/2015   Lab Results  Component Value Date   ALT 21 11/30/2014   AST 20 11/30/2014   ALKPHOS 60 11/30/2014   BILITOT 0.5 11/30/2014   Lab Results  Component Value Date   CHOL 161 04/17/2014   Lab Results  Component Value Date   HDL 47.60 04/17/2014   Lab Results  Component Value Date   LDLCALC 63 07/04/2013   Lab Results  Component Value Date   TRIG 218.0* 04/17/2014   Lab Results  Component Value Date   CHOLHDL 3 04/17/2014   Lab Results  Component Value Date   HGBA1C 7.9* 02/18/2015    IMPRESSION AND PLAN:  1) DM 2; needs A1c repeat. Continue current meds/dosing for now EXCEPT I told him to take a week or so OFF of metformin to see if his tendency towards diarrhea improves.  2) HTN:  The current medical regimen is effective;  continue present plan and medications. BMET today.  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  Looks like his med list has only MVI.  May need to clarify whether or not he is still taking a separate  ferrous sulfate tab daily.  4) Intermittent diarrhea: does not sound like a pattern of infectious-type diarrhea.  Suspect med side effect--possibly metformin. See info in #1 above. If worsens then he'll return before his next routine 3 mo f/u.  5) Diabetic PN pain: improved with neurontin 126m tid but need to titrate dose up slowly to 208mtid---new rx given today.  An After Visit Summary was printed and given to the patient.  FOLLOW UP: Return in about 3 months (around 08/17/2015) for routine chronic illness f/u (30 min).  Signed:  PhCrissie SicklesMD           05/17/2015

## 2015-05-17 NOTE — Telephone Encounter (Signed)
LM for patient to return call on wife's cell # ATC home #, NA, unable to leave message on machine. Called pt cell #, aware that we will send his rx's to the pharmacy for the Symbicort and Albuterol  If anything further needed to call back .  Nothing further needed.

## 2015-06-06 ENCOUNTER — Other Ambulatory Visit: Payer: Self-pay | Admitting: Licensed Clinical Social Worker

## 2015-06-06 NOTE — Patient Outreach (Signed)
Assessment:  CSW spoke via phone with Joshua [name], spouse of client, on 06/06/15. CSW verified identity of Joshua [name]. Joshua [name] and CSW spoke of client needs. Joshua [name] said that client had his prescribed medications and was taking medications as prescribed. She said client was eating well and sleeping well. Client sees Dr. Anitra [name] as primary care doctor. Client saw Dr Joshua [name] in May of 2017 for appointment.  CSW and Joshua [name] spoke of client care plan. CSW encouraged that client attend all scheduled client medical appointments in the next 30 days.  Joshua [name] said she transports client to and from client's medical appointments.  CSW encouraged that client participate in hobbies or activities of choice.  CSW encouraged that client sit outdoors occasionally on pretty days to enjoy nice weather.Joshua [name] said client enjoys sitting outdoors in sunny weather.  CSW enocuraged that client communicate via phone or in person with family or friends as a means of socialization.  Client had appointment with pulmonologist last month.  Client uses inhaler as prescribed.  He receives nebulizer treatments daily as prescribed. Client watches TV to help him relax. CSW spoke with Joshua [name] about encouraging client to participate in activities that help client relax or reduce stress.  She said he watches favorite TV shows to relax often.   CSW reminded Joshua [name] of [facility] program support in areas of CSW, nursing and pharmacy. CSW invited client/Joshua [name] to call CSW at [phone] as needed to discuss social work needs of client. CSW thanked Joshua [name] for phone conversation with CSW on 06/06/15 to discuss needs of client.   Plan:  Client to attend all scheduled client medical appointments in next 30 days. CSW to call client/Joshua [name] Joshua [name] in 4 weeks to assess needs of client.   [name].Joshua [name] MSW, LCSW Licensed Clinical Social Worker [facility] Care Management [phone]

## 2015-06-10 ENCOUNTER — Other Ambulatory Visit: Payer: Self-pay | Admitting: Family Medicine

## 2015-06-10 MED ORDER — BUPROPION HCL ER (SR) 150 MG PO TB12: ORAL_TABLET | ORAL | Status: DC

## 2015-06-10 MED ORDER — METFORMIN HCL 1000 MG PO TABS: 1000.0000 mg | ORAL_TABLET | Freq: Two times a day (BID) | ORAL | Status: DC

## 2015-06-10 MED ORDER — GLIMEPIRIDE 4 MG PO TABS: ORAL_TABLET | ORAL | Status: DC

## 2015-07-04 ENCOUNTER — Other Ambulatory Visit: Payer: Self-pay | Admitting: Licensed Clinical Social Worker

## 2015-07-04 NOTE — Patient Outreach (Signed)
Assessment:  CSW spoke via phone with [name], spouse of client, on 07/04/15. CSW verified identity of Abdelaziz Westenberger. [name] and CSW spoke of client needs.  Client has prescribed medications and is taking medications as prescribed. Client has support from his spouse, Holley Wirt. Client is eating adequately and is sleeping adequately.  Client sees Dr. Anitra Lauth as his primary care doctor. CSW and [name] spoke of client care plan. CSW encouraged that client attend all scheduled client medical appointments in the next 30 days.  [name] said she transports client to and from client's scheduled medical appointments. [name] also said that sometimes client can drive himself to his scheduled medical appointments. [name] said client enjoys sitting outdoors on sunny days. CSW encouraged also that client speak via phone with family and friends occasionally as a means of socialization for client.   Client also enjoys watching TV to help him relax.   CSW and [name] spoke of relaxation techniques to help client manage depression and depression symptoms. CSW encouraged that client use relaxation techniques client enjoys to help client manage depression symptoms. CSW thanked [place] for phone call with CSW on 07/04/15.  CSW encouraged for client or Treyveon Mochizuki to call CSW at [phone] as needed to discuss social work needs of client.  Plan:  Client to attend all scheduled client medical appointments in the next 30 days. CSW to call client/[name] Dermody in 4 weeks to assess client needs at that time.  [name].Donelle Baba MSW, LCSW Licensed Clinical Social Worker [facility] Care Management [phone]

## 2015-07-08 ENCOUNTER — Encounter: Payer: Self-pay | Admitting: Family Medicine

## 2015-07-08 ENCOUNTER — Ambulatory Visit (INDEPENDENT_AMBULATORY_CARE_PROVIDER_SITE_OTHER): Payer: Medicare Other | Admitting: Family Medicine

## 2015-07-08 VITALS — BP 160/68 | HR 73 | Temp 98.8°F | Resp 16 | Ht 71.5 in | Wt 250.5 lb

## 2015-07-08 DIAGNOSIS — I10 Essential (primary) hypertension: Secondary | ICD-10-CM

## 2015-07-08 MED ORDER — AMLODIPINE BESYLATE 5 MG PO TABS: 5.0000 mg | ORAL_TABLET | Freq: Every day | ORAL | Status: DC

## 2015-07-08 NOTE — Progress Notes (Signed)
Pre visit review using our clinic review tool, if applicable. No additional management support is needed unless otherwise documented below in the visit note.

## 2015-07-08 NOTE — Progress Notes (Signed)
OFFICE VISIT  07/08/2015   CC:  Chief Complaint  Patient presents with  . Follow-up    HTN, BP has been elevated.    HPI:    Patient is a 75 y.o. Caucasian male who presents for gradually increasing bp at home: getting up to 170s of late.  Diastolics creeping up a little as well.  Says he feels "washed out". No recurrent hypoglycemia spells.  No CP, no change in his baseline DOE.  Fluid in legs when dependent, resolves with elevation.  Wt is stable. He did resume smoking about 1 mo ago and then quit again after a couple weeks. No OTC decongestants used, no change in caffeine intake.  Past Medical History  Diagnosis Date  . Hypertension   . Tobacco dependence in remission     100+ pack-yr hx: quit after CABG  . Obesity   . Hyperplastic colon polyp 2001  . Erectile dysfunction     Normal testosterone  . Adenomatous colon polyp 10/16/2011    Repeat 2018  . 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 pylori neg, no dysplasia or malignancy.  . Macular degeneration, dry     Mild, bilat (Optometrist, Mayford Knife at [employer] [facility] [place] in [place], [place])  . Iron deficiency anemia 2014    03/2012 capsule endoscopy showed 2 AVMs--likely responsible for his IDA--lifetime iron supp recommended + q14moCBCs.  . 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 COPD (chronic obstructive pulmonary disease) ([place]     GOLD II.  Spirometry  2004 borderline obstruction; 2015 mod obst  . Hyperlipidemia   . Pericardial effusion with cardiac tamponade 6//29/15    pericardiocentesis was done,  Infectious/inflamm (cytology showed NO MALIGNANT CELLS)  . Other and unspecified angina pectoris   . DOE (dyspnea on exertion)     COPD + chronic diastolic HF  . Pneumonia   . DM type 2 (diabetes mellitus, type 2) (HCC)     Poor control on max oral meds--pt eventually agreed to insulin therap  . CAD, multiple vessel     3V CAD cath 08/08/13----CABG done shortly after.  . Chronic renal  insufficiency, stage II (mild)   . Diabetic nephropathy (HCC)     Elevated urine microalb/cr 03/2011  . Hearing loss of both ears 2016    Hearing aids  . Chronic diastolic heart failure ([facility] 2016  . PAF (paroxysmal atrial fibrillation) ([facility] 10/2014    xarelto    Past Surgical History  Procedure Laterality Date  . Cholecystectomy open  1999  . Appendectomy  1957  . Hip surgery Right 1954    Repair of slipped capital femoral epiphysis.  . Patella fracture surgery Left ~ 1979    bolt + 3 screws to repair tib plateau fx  . Testicle surgery  as a child    Undescended testicle brought down into scrotum  . Tonsillectomy  1947  . Colonoscopy  10/16/2011    Procedure: COLONOSCOPY;  Surgeon: RInda Castle MD;  [facility];  Service: Endoscopy;  Laterality: N/A;  . Cataract extraction w/ intraocular lens  implant, bilateral  04/08/2006 & 04/22/2006  . Esophagogastroduodenoscopy  02/25/12    Atrophic gastritis with a few erosions--capsule endoscopy planned as of 02/25/12 (Dr. KDeatra Ina.  . 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 Hardware removal Right 08/12/2012    Procedure: HARDWARE REMOVAL;  Surgeon: Mcarthur Rossetti, MD;  [facility];  Service: Orthopedics;  Laterality: Right;  . Cardiac catheterization  08/08/2013  . Cataract extraction w/ intraocular lens  implant, bilateral Bilateral   . Coronary artery bypass graft N/A 08/14/2013    Procedure: CORONARY ARTERY BYPASS GRAFTING (CABG);  Surgeon: Melrose Nakayama, MD;  Location: [facility];  Service: Open Heart Surgery;  Laterality: N/A;  Times 4   using left internal mammary artery and endoscopically harvested bilateral saphenous vein  . Intraoperative transesophageal echocardiogram N/A 08/14/2013    Normal LV function. Procedure:  INTRAOPERATIVE TRANSESOPHAGEAL ECHOCARDIOGRAM;  Surgeon: Melrose Nakayama, MD;  Location: [facility];  Service: Open Heart Surgery;  Laterality: N/A;  . Pericardial tap N/A 07/01/2013    Procedure: PERICARDIAL TAP;  Surgeon: Jettie Booze, MD;  Location: [facility] CATH LAB;  Service: Cardiovascular;  Laterality: N/A;  . Left heart catheterization with coronary angiogram N/A 08/08/2013    Procedure: LEFT HEART CATHETERIZATION WITH CORONARY ANGIOGRAM;  Surgeon: Jettie Booze, MD;  Location: [facility] CATH LAB;  Service: Cardiovascular;  Laterality: N/A;  . Transthoracic echocardiogram  10/30/14    Mod LVH, EF 60-65%, normal wall motion, mod mitral regurg, mild PAH    Outpatient Prescriptions Prior to Visit  Medication Sig Dispense Refill  . ACCU-CHEK AVIVA PLUS test strip Reported on 02/26/2015    . acetaminophen (TYLENOL) 325 MG tablet Take 2 tablets (650 mg total) by mouth every 4 (four) hours as needed for headache or mild pain.    [name] albuterol (PROVENTIL HFA;VENTOLIN HFA) 108 (90 Base) MCG/ACT inhaler Inhale 2 puffs into the lungs every 6 (six) hours as needed for wheezing or shortness of breath. 1 Inhaler 6  . aspirin 81 MG tablet Take 81 mg by mouth at bedtime. Reported on 02/26/2015    . budesonide-formoterol (SYMBICORT) 160-4.5 MCG/ACT inhaler Inhale 2 puffs into the lungs 2 (two) times daily. 1 Inhaler 12  . buPROPion (WELLBUTRIN SR) 150 MG 12 hr tablet Take 1 tablet by mouth two  times daily 60 tablet 3  . chlorthalidone (HYGROTON) 25 MG tablet Take by mouth daily. Reported on 02/26/2015    . Cholecalciferol (VITAMIN D-3) 1000 units CAPS Take 1,000 Units by mouth daily. Reported on 02/26/2015    . Chromium Picolinate 1000 MCG TABS Take 1,000 mcg by mouth daily. Reported on 02/26/2015    . citalopram (CELEXA) 40 MG tablet Take 1 tablet (40 mg total) by mouth daily. 90 tablet 3  . diltiazem (CARDIZEM CD) 240 MG 24 hr capsule Take 1 capsule (240 mg total) by mouth daily. 30 capsule 1  . fluticasone  (FLONASE) 50 MCG/ACT nasal spray Place 2 sprays into both nostrils daily. 16 g 1  . furosemide (LASIX) 40 MG tablet 20 mg daily.     [name] gabapentin (NEURONTIN) 100 MG capsule 2 caps po tid 180 capsule 4  . glimepiride (AMARYL) 4 MG tablet Take 2 tablets by mouth  daily with breakfast 60 tablet 3  . guaiFENesin (MUCINEX) 600 MG 12 hr tablet Take 2 tablets (1,200 mg total) by mouth 2 (two) times daily. 30 tablet 0  . Insulin Detemir (LEVEMIR FLEXTOUCH) 100 UNIT/ML Pen Inject 55 Units into the  skin at bedtime. 45 mL 3  . insulin regular (NOVOLIN R,HUMULIN R) 100 units/mL injection 8 Units SQ with BF and Lunch, 6 Units SQ with Supper 10 mL 6  .  isosorbide mononitrate (IMDUR) 30 MG 24 hr tablet Take 1 tablet by mouth  daily 90 tablet 3  . Multiple Vitamin (MULTIVITAMIN WITH MINERALS) TABS Take 1 tablet by mouth daily.    . nitroGLYCERIN (NITROSTAT) 0.4 MG SL tablet Place 1 tablet (0.4 mg total) under the tongue every 5 (five) minutes as needed for chest pain. 50 tablet 3  . pioglitazone (ACTOS) 45 MG tablet Take by mouth daily. Reported on 02/26/2015    . ramipril (ALTACE) 10 MG capsule Take 10 mg by mouth 2 (two) times daily.     . rivaroxaban (XARELTO) 20 MG TABS tablet Take 1 tablet (20 mg total) by mouth daily with supper. 90 tablet 4  . tamsulosin (FLOMAX) 0.4 MG CAPS capsule TAKE 1 CAPSULE BY MOUTH  EVERY DAY AFTER SUPPER 30 capsule 6  . budesonide-formoterol (SYMBICORT) 160-4.5 MCG/ACT inhaler Inhale 2 puffs into the lungs 2 (two) times daily. (Patient not taking: Reported on 07/08/2015) 1 Inhaler 6  . metFORMIN (GLUCOPHAGE) 1000 MG tablet Take 1 tablet (1,000 mg total) by mouth 2 (two) times daily with a meal. (Patient not taking: Reported on 07/08/2015) 60 tablet 3   No facility-administered medications prior to visit.    Allergies  Allergen Reactions  . Morphine And Related Other (See Comments)    Drenched with perspiration  . Demerol [Meperidine] Nausea Only  . Starlix [Nateglinide] Other  (See Comments)    gassy    ROS As per HPI  PE: Blood pressure 160/68, pulse 73, temperature 98.8 F (37.1 C), temperature source Oral, resp. rate 16, height 5' 11.5" (1.816 m), weight 250 lb 8 oz (113.626 kg), SpO2 93 %. Gen: Alert, well appearing.  Patient is oriented to person, place, time, and situation. XNT:ZGYF: no injection, icteris, swelling, or exudate.  EOMI, PERRLA. Mouth: lips without lesion/swelling.  Oral mucosa pink and moist. Oropharynx without erythema, exudate, or swelling.  Neck - No masses or thyromegaly or limitation in range of motion CV: RRR with frequent ectopy, rate 70s.  No m/r/g. LUNGS: bibasilar exp soft wheezes, good aeration, nonlabored resps.  No crackles.   EXT: no c/c/e  LABS:  Lab Results  Component Value Date   TSH 0.918 07/01/2013   Lab Results  Component Value Date   WBC 7.1 05/17/2015   HGB 12.2* 05/17/2015   HCT 37.6* 05/17/2015   MCV 81.3 05/17/2015   PLT 229.0 05/17/2015   Lab Results  Component Value Date   CREATININE 0.95 05/17/2015   BUN 21 05/17/2015   NA 136 05/17/2015   K 5.2* 05/17/2015   CL 104 05/17/2015   CO2 24 05/17/2015   Lab Results  Component Value Date   ALT 21 11/30/2014   AST 20 11/30/2014   ALKPHOS 60 11/30/2014   BILITOT 0.5 11/30/2014   Lab Results  Component Value Date   CHOL 161 04/17/2014   Lab Results  Component Value Date   HDL 47.60 04/17/2014   Lab Results  Component Value Date   LDLCALC 63 07/04/2013   Lab Results  Component Value Date   TRIG 218.0* 04/17/2014   Lab Results  Component Value Date   CHOLHDL 3 04/17/2014   Lab Results  Component Value Date   PSA 1.33 09/01/2011    IMPRESSION AND PLAN:  Uncontrolled HTN: no clear cause. Presumably natural progression of essential HTN. Will add amlodipine 42m qd today.  Therapeutic expectations and side effect profile of medication discussed today.  Patient's questions answered.  Continue ramipril 165mbid  and cardizem CD 22m  qd, imdur 39mqd, and chlorthalidone 2567md.  An After Visit Summary was printed and given to the patient.  FOLLOW UP: Return in about 2 weeks (around 07/22/2015) for f/u HTN.  Signed:  PhiCrissie SicklesD           07/08/2015

## 2015-07-11 DIAGNOSIS — H532 Diplopia: Secondary | ICD-10-CM | POA: Diagnosis not present

## 2015-07-11 DIAGNOSIS — Z7984 Long term (current) use of oral hypoglycemic drugs: Secondary | ICD-10-CM | POA: Diagnosis not present

## 2015-07-11 DIAGNOSIS — E119 Type 2 diabetes mellitus without complications: Secondary | ICD-10-CM | POA: Diagnosis not present

## 2015-07-11 DIAGNOSIS — H353132 Nonexudative age-related macular degeneration, bilateral, intermediate dry stage: Secondary | ICD-10-CM | POA: Diagnosis not present

## 2015-07-11 LAB — HM DIABETES EYE EXAM

## 2015-07-22 ENCOUNTER — Other Ambulatory Visit: Payer: Self-pay | Admitting: Family Medicine

## 2015-07-22 ENCOUNTER — Encounter: Payer: Self-pay | Admitting: Family Medicine

## 2015-07-22 ENCOUNTER — Ambulatory Visit (INDEPENDENT_AMBULATORY_CARE_PROVIDER_SITE_OTHER): Payer: Medicare Other | Admitting: Family Medicine

## 2015-07-22 ENCOUNTER — Ambulatory Visit: Payer: Medicare Other | Admitting: Family Medicine

## 2015-07-22 VITALS — BP 105/63 | HR 72 | Temp 98.1°F | Resp 16 | Ht 71.5 in | Wt 249.2 lb

## 2015-07-22 DIAGNOSIS — I10 Essential (primary) hypertension: Secondary | ICD-10-CM | POA: Diagnosis not present

## 2015-07-22 DIAGNOSIS — F17200 Nicotine dependence, unspecified, uncomplicated: Secondary | ICD-10-CM

## 2015-07-22 MED ORDER — VARENICLINE TARTRATE 1 MG PO TABS: 1.0000 mg | ORAL_TABLET | Freq: Two times a day (BID) | ORAL | Status: DC

## 2015-07-22 MED ORDER — ACCU-CHEK AVIVA PLUS VI STRP: ORAL_STRIP | Status: DC

## 2015-07-22 MED ORDER — VARENICLINE TARTRATE 0.5 MG X 11 & 1 MG X 42 PO MISC: ORAL | Status: DC

## 2015-07-22 MED ORDER — AMLODIPINE BESYLATE 5 MG PO TABS: 5.0000 mg | ORAL_TABLET | Freq: Every day | ORAL | Status: DC

## 2015-07-22 NOTE — Progress Notes (Signed)
OFFICE VISIT  07/22/2015   CC:  Chief Complaint  Patient presents with  . Follow-up    HTN.    HPI:    Patient is a 75 y.o. Caucasian male who presents for 2 week f/u uncontrolled HTN. I added amlodipine 90m to his regimen at last visit. Pt starting to consistently get blood pressure average around 130 over 70.  No side effects felt from amlodipine.  He is back smoking again, says he wants to try chantix.   Past Medical History  Diagnosis Date  . Hypertension   . Tobacco dependence in remission     100+ pack-yr hx: quit after CABG  . Obesity   . Hyperplastic colon polyp 2001  . Erectile dysfunction     Normal testosterone  . Adenomatous colon polyp 10/16/2011    Repeat 2018  . 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 pylori neg, no dysplasia or malignancy.  . Macular degeneration, dry     Mild, bilat ([facility] [place] [facility] [place]  . Iron deficiency anemia 2014    03/2012 capsule endoscopy showed 2 AVMs--likely responsible for his IDA--lifetime iron supp recommended + q634moBCs.  . 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 COPD (chronic obstructive pulmonary disease) ([place]    GOLD II.  Spirometry  2004 borderline obstruction; 2015 mod obst  . Hyperlipidemia   . Pericardial effusion with cardiac tamponade 6//29/15    pericardiocentesis was done,  Infectious/inflamm (cytology showed NO MALIGNANT CELLS)  . Other and unspecified angina pectoris   . DOE (dyspnea on exertion)     COPD + chronic diastolic HF  . Pneumonia   . DM type 2 (diabetes mellitus, type 2) (HCC)     Poor control on max oral meds--pt eventually agreed to insulin therap  . CAD, multiple vessel     3V CAD cath 08/08/13----CABG done shortly after.  . Chronic renal insufficiency, stage II (mild)   . Diabetic nephropathy (HCC)     Elevated urine microalb/cr 03/2011  . Hearing loss of both ears 2016    Hearing aids  . Chronic diastolic  heart failure ([place]2016  . PAF (paroxysmal atrial fibrillation) ([place]/2016    xarelto    Past Surgical History  Procedure Laterality Date  . Cholecystectomy open  1999  . Appendectomy  1957  . Hip surgery Right 1954    Repair of slipped capital femoral epiphysis.  . Patella fracture surgery Left ~ 1979    bolt + 3 screws to repair tib plateau fx  . Testicle surgery  as a child    Undescended testicle brought down into scrotum  . Tonsillectomy  1947  . Colonoscopy  10/16/2011    Procedure: COLONOSCOPY;  Surgeon: RoInda CastleMD;  [facility];  Service: Endoscopy;  Laterality: N/A;  . Cataract extraction w/ intraocular lens  implant, bilateral  04/08/2006 & 04/22/2006  . Esophagogastroduodenoscopy  02/25/12    Atrophic gastritis with a few erosions--capsule endoscopy planned as of 02/25/12 (Dr. KaDeatra Ina  . 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 Hardware removal Right 08/12/2012    Procedure: HARDWARE REMOVAL;  Surgeon: ChMcarthur RossettiMD;  [facility];  Service: Orthopedics;  Laterality:  Right;  . Cardiac catheterization  08/08/2013  . Cataract extraction w/ intraocular lens  implant, bilateral Bilateral   . Coronary artery bypass graft N/A 08/14/2013    Procedure: CORONARY ARTERY BYPASS GRAFTING (CABG);  Surgeon: Melrose Nakayama, MD;  Location: [facility];  Service: Open Heart Surgery;  Laterality: N/A;  Times 4   using left internal mammary artery and endoscopically harvested bilateral saphenous vein  . Intraoperative transesophageal echocardiogram N/A 08/14/2013    Normal LV function. Procedure: INTRAOPERATIVE TRANSESOPHAGEAL ECHOCARDIOGRAM;  Surgeon: Melrose Nakayama, MD;  Location: [place];  Service: Open Heart Surgery;  Laterality: N/A;  . Pericardial tap N/A  07/01/2013    Procedure: PERICARDIAL TAP;  Surgeon: Jettie Booze, MD;  Location[facility] CATH LAB;  Service: Cardiovascular;  Laterality: N/A;  . Left heart catheterization with coronary angiogram N/A 08/08/2013    Procedure: LEFT HEART CATHETERIZATION WITH CORONARY ANGIOGRAM;  Surgeon: Jettie Booze, MD;  Location: [facility] CATH LAB;  Service: Cardiovascular;  Laterality: N/A;  . Transthoracic echocardiogram  10/30/14    Mod LVH, EF 60-65%, normal wall motion, mod mitral regurg, mild PAH    Outpatient Prescriptions Prior to Visit  Medication Sig Dispense Refill  . acetaminophen (TYLENOL) 325 MG tablet Take 2 tablets (650 mg total) by mouth every 4 (four) hours as needed for headache or mild pain.    [name] albuterol (PROVENTIL HFA;VENTOLIN HFA) 108 (90 Base) MCG/ACT inhaler Inhale 2 puffs into the lungs every 6 (six) hours as needed for wheezing or shortness of breath. 1 Inhaler 6  . aspirin 81 MG tablet Take 81 mg by mouth at bedtime. Reported on 02/26/2015    . budesonide-formoterol (SYMBICORT) 160-4.5 MCG/ACT inhaler Inhale 2 puffs into the lungs 2 (two) times daily. 1 Inhaler 12  . buPROPion (WELLBUTRIN SR) 150 MG 12 hr tablet Take 1 tablet by mouth two  times daily 60 tablet 3  . chlorthalidone (HYGROTON) 25 MG tablet Take by mouth daily. Reported on 02/26/2015    . Cholecalciferol (VITAMIN D-3) 1000 units CAPS Take 1,000 Units by mouth daily. Reported on 02/26/2015    . Chromium Picolinate 1000 MCG TABS Take 1,000 mcg by mouth daily. Reported on 02/26/2015    . citalopram (CELEXA) 40 MG tablet Take 1 tablet (40 mg total) by mouth daily. 90 tablet 3  . diltiazem (CARDIZEM CD) 240 MG 24 hr capsule Take 1 capsule (240 mg total) by mouth daily. 30 capsule 1  . fluticasone (FLONASE) 50 MCG/ACT nasal spray Place 2 sprays into both nostrils daily. 16 g 1  . furosemide (LASIX) 40 MG tablet 20 mg daily.     [name] gabapentin (NEURONTIN) 100 MG capsule 2 caps po tid 180 capsule 4  . glimepiride (AMARYL) 4 MG  tablet Take 2 tablets by mouth  daily with breakfast 60 tablet 3  . guaiFENesin (MUCINEX) 600 MG 12 hr tablet Take 2 tablets (1,200 mg total) by mouth 2 (two) times daily. 30 tablet 0  . Insulin Detemir (LEVEMIR FLEXTOUCH) 100 UNIT/ML Pen Inject 55 Units into the  skin at bedtime. 45 mL 3  . insulin regular (NOVOLIN R,HUMULIN R) 100 units/mL injection 8 Units SQ with BF and Lunch, 6 Units SQ with Supper 10 mL 6  . isosorbide mononitrate (IMDUR) 30 MG 24 hr tablet Take 1 tablet by mouth  daily 90 tablet 3  . Multiple Vitamin (MULTIVITAMIN WITH MINERALS) TABS Take 1 tablet by mouth daily.    . nitroGLYCERIN (NITROSTAT) 0.4 MG SL tablet Place  1 tablet (0.4 mg total) under the tongue every 5 (five) minutes as needed for chest pain. 50 tablet 3  . pioglitazone (ACTOS) 45 MG tablet Take by mouth daily. Reported on 02/26/2015    . ramipril (ALTACE) 10 MG capsule Take 10 mg by mouth 2 (two) times daily.     . rivaroxaban (XARELTO) 20 MG TABS tablet Take 1 tablet (20 mg total) by mouth daily with supper. 90 tablet 4  . tamsulosin (FLOMAX) 0.4 MG CAPS capsule TAKE 1 CAPSULE BY MOUTH  EVERY DAY AFTER SUPPER 30 capsule 6  . ACCU-CHEK AVIVA PLUS test strip Reported on 02/26/2015    . amLODipine (NORVASC) 5 MG tablet Take 1 tablet (5 mg total) by mouth daily. 30 tablet 1   No facility-administered medications prior to visit.    Allergies  Allergen Reactions  . Morphine And Related Other (See Comments)    Drenched with perspiration  . Demerol [Meperidine] Nausea Only  . Starlix [Nateglinide] Other (See Comments)    gassy    ROS As per HPI  PE: Blood pressure 105/63, pulse 72, temperature 98.1 F (36.7 C), temperature source Oral, resp. rate 16, height 5' 11.5" (1.816 m), weight 249 lb 4 oz (113.059 kg), SpO2 92 %. Gen: Alert, well appearing.  Patient is oriented to person, place, time, and situation. AFFECT: pleasant, lucid thought and speech. CV: RRR, distant S1 and S2, no audible m/r/g EXT: trace  R LE pitting edema, 1+ L LE pitting edema  LABS:    Chemistry      Component Value Date/Time   NA 136 05/17/2015 0831   K 5.2* 05/17/2015 0831   CL 104 05/17/2015 0831   CO2 24 05/17/2015 0831   BUN 21 05/17/2015 0831   CREATININE 0.95 05/17/2015 0831   CREATININE 1.03 11/26/2014 1145      Component Value Date/Time   CALCIUM 9.0 05/17/2015 0831   ALKPHOS 60 11/30/2014 0441   AST 20 11/30/2014 0441   ALT 21 11/30/2014 0441   BILITOT 0.5 11/30/2014 0441      IMPRESSION AND PLAN:  1) HTN: now under control again. The current medical regimen is effective;  continue present plan and medications.  2) Tobacco dependence: pt wants to try chantix, so I rx'd this today.  An After Visit Summary was printed and given to the patient.  FOLLOW UP: Return for keep appt already set for august.  Signed:  Crissie Sickles, MD           07/22/2015

## 2015-07-22 NOTE — Telephone Encounter (Signed)
Please review sigs. Pharmacy's sig does not match sig in EMR.    RF request for novolin R LOV: 07/22/15 Next ov: 08/21/15 Last written: 12/17/14 #4m w/ 6RF  Please advise. Thanks.

## 2015-07-22 NOTE — Telephone Encounter (Signed)
RF request for proair LOV: 07/22/15 Next ov: 08/21/15 Last written:05/17/15 #1w/ 6RF

## 2015-07-22 NOTE — Telephone Encounter (Signed)
RF request for tamsulosin LOV: 07/12/15 Next ov: 08/21/15 Last written: 04/24/15 #30 w/ 6RF

## 2015-07-22 NOTE — Progress Notes (Signed)
Pre visit review using our clinic review tool, if applicable. No additional management support is needed unless otherwise documented below in the visit note.

## 2015-07-22 NOTE — Addendum Note (Signed)
Addended by: Onalee Hua on: 07/22/2015 11:25 AM   Modules accepted: Orders

## 2015-07-30 ENCOUNTER — Other Ambulatory Visit: Payer: Self-pay | Admitting: *Deleted

## 2015-07-30 ENCOUNTER — Telehealth: Payer: Self-pay | Admitting: Family Medicine

## 2015-07-30 MED ORDER — INSULIN REGULAR HUMAN 100 UNIT/ML IJ SOLN: INTRAMUSCULAR | 6 refills | Status: DC

## 2015-07-30 NOTE — Telephone Encounter (Signed)
Patient called to report that he went to [facility] to pick up his diabetic medication, (pt was unable to tell me the name of the medication).  He states this is not the same medication that he has been taking and is inquiring why the medication was changed without him being notified.  Please follow-up with patient.

## 2015-07-30 NOTE — Telephone Encounter (Signed)
Joshua [name], please complete your note and sign encounter. Thanks.

## 2015-07-30 NOTE — Telephone Encounter (Signed)
Spoke with pt he stated that he was taking the Novolin R and would like to go back to it since it is cheaper than the Humulin R. New Rx for Novolin R eRxed to pharmacy. Pt advised and voiced understanding.

## 2015-08-01 ENCOUNTER — Other Ambulatory Visit: Payer: Self-pay | Admitting: Licensed Clinical Social Worker
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
## 2015-08-12 ENCOUNTER — Other Ambulatory Visit: Payer: Self-pay | Admitting: Family Medicine

## 2015-08-21 ENCOUNTER — Encounter: Payer: Self-pay | Admitting: Family Medicine

## 2015-08-21 ENCOUNTER — Ambulatory Visit (INDEPENDENT_AMBULATORY_CARE_PROVIDER_SITE_OTHER): Payer: Medicare Other | Admitting: Family Medicine

## 2015-08-21 VITALS — BP 128/72 | HR 80 | Temp 98.3°F | Resp 16 | Ht 71.5 in | Wt 247.2 lb

## 2015-08-21 DIAGNOSIS — I10 Essential (primary) hypertension: Secondary | ICD-10-CM | POA: Diagnosis not present

## 2015-08-21 DIAGNOSIS — F17200 Nicotine dependence, unspecified, uncomplicated: Secondary | ICD-10-CM | POA: Diagnosis not present

## 2015-08-21 DIAGNOSIS — Z862 Personal history of diseases of the blood and blood-forming organs and certain disorders involving the immune mechanism: Secondary | ICD-10-CM

## 2015-08-21 DIAGNOSIS — Z8639 Personal history of other endocrine, nutritional and metabolic disease: Secondary | ICD-10-CM

## 2015-08-21 DIAGNOSIS — E118 Type 2 diabetes mellitus with unspecified complications: Secondary | ICD-10-CM

## 2015-08-21 DIAGNOSIS — E785 Hyperlipidemia, unspecified: Secondary | ICD-10-CM

## 2015-08-21 LAB — BASIC METABOLIC PANEL
BUN: 22 mg/dL (ref 6–23)
CO2: 29 meq/L (ref 19–32)
CREATININE: 1.01 mg/dL (ref 0.40–1.50)
Calcium: 9.1 mg/dL (ref 8.4–10.5)
Chloride: 104 mEq/L (ref 96–112)
GFR: 76.27 mL/min (ref >60.00)
GLUCOSE: 250 mg/dL — AB (ref 70–99)
Potassium: 4.4 mEq/L (ref 3.5–5.1)
Sodium: 139 mEq/L (ref 135–145)

## 2015-08-21 LAB — HEMOGLOBIN A1C: Hgb A1c MFr Bld: 9.2 % — ABNORMAL HIGH (ref 4.6–6.5)

## 2015-08-21 NOTE — Progress Notes (Signed)
Pre visit review using our clinic review tool, if applicable. No additional management support is needed unless otherwise documented below in the visit note.

## 2015-08-21 NOTE — Progress Notes (Signed)
OFFICE VISIT  08/21/2015   CC:  Chief Complaint  Patient presents with  . Follow-up    Pt is not fasting.   HPI:    Patient is a 75 y.o. Caucasian male who presents for 3 mo f/u DM 2, HTN, HLD, and tob dependence--we started chantix a month ago. He sees cardiologist for chronic diastolic CHF, CAD, and PAF/chronic anticoagulation. He sees pulm for COPD.    He is taking chantix and doing well, so far so good.  Three mo ago his A1c was increased so we increased his levemir to 60 U qhs and mealtime insulin to 12 U each meal.  He was to continue his current dosing of glimeperide and pioglitazone. Checks glucose qAM and range is 100-160.  Bedtime 245-280.  He eats a spoon of peanut butter at bedtime. No hypoglycemia since last visit.  He takes 13m lovastatin bid for hyperlipidemia/CAD.  No probs with this med. We have not been able to get a cholesterol panel on him b/c he can never come in fasting.  BP checks twice a day: consistently <140/90.  ROS: no CP, no HAs, no dizziness, no palpitations, no myalgias, no melena or hematochezia.  Past Medical History:  Diagnosis Date  . Adenomatous colon polyp 10/16/2011   Repeat 2018  . Arthritis     Hips, R>L & KNEES  . CAD, multiple vessel    3V CAD cath 08/08/13----CABG done shortly after.  . 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 pylori neg, no dysplasia or malignancy.  . Chronic diastolic heart failure [place]  . Chronic renal insufficiency, stage II (mild)   . COPD (chronic obstructive pulmonary disease) [place]    GOLD II.  Spirometry  2004 borderline obstruction; 2015 mod obst  . Diabetic nephropathy (HCC)    Elevated urine microalb/cr 03/2011  . DM type 2 (diabetes mellitus, type 2) (HCC)    Poor control on max oral meds--pt eventually agreed to insulin therap  . DOE (dyspnea on exertion)    COPD + chronic diastolic HF  . Erectile dysfunction    Normal testosterone  . Hearing loss of both  ears 2016   Hearing aids  . Hyperlipidemia   . Hyperplastic colon polyp 2001  . Hypertension   . Iron deficiency anemia 2014   03/2012 capsule endoscopy showed 2 AVMs--likely responsible for his IDA--lifetime iron supp recommended + q634moBCs.  . Macular degeneration, dry    Mild, bilat (Optometrist, [place] [employer] [place] . Obesity   . Other and unspecified angina pectoris   . PAF (paroxysmal atrial fibrillation) ([place]   xarelto  . Pericardial effusion with cardiac tamponade 6//29/15   pericardiocentesis was done,  Infectious/inflamm (cytology showed NO MALIGNANT CELLS)  . Pneumonia   . Tobacco dependence in remission    100+ pack-yr hx: quit after CABG    Past Surgical History:  Procedure Laterality Date  . APPENDECTOMY  1957  . CARDIAC CATHETERIZATION  08/08/2013  . CATARACT EXTRACTION W/ INTRAOCULAR LENS  IMPLANT, BILATERAL  04/08/2006 & 04/22/2006  . CATARACT EXTRACTION W/ INTRAOCULAR LENS  IMPLANT, BILATERAL Bilateral   . CHOLECYSTECTOMY OPEN  1999  . COLONOSCOPY  10/16/2011   Procedure: COLONOSCOPY;  Surgeon: RoInda CastleMD;  [facility];  Service: Endoscopy;  Laterality: N/A;  . CORONARY ARTERY BYPASS GRAFT N/A 08/14/2013   Procedure: CORONARY ARTERY BYPASS GRAFTING (CABG);  Surgeon: StMelrose NakayamaMD;  Location:  [facility];  Service: Open Heart Surgery;  Laterality: N/A;  Times 4   using left internal mammary artery and endoscopically harvested bilateral saphenous vein  . ESOPHAGOGASTRODUODENOSCOPY  02/25/12   Atrophic gastritis with a few erosions--capsule endoscopy planned as of 02/25/12 (Dr. Deatra Ina).  [name] HARDWARE REMOVAL Right 08/12/2012   Procedure: HARDWARE REMOVAL;  Surgeon: Mcarthur Rossetti, MD;  [facility]: [facility];  Service: Orthopedics;  Laterality: Right;  . HIP SURGERY Right 1954   Repair of slipped capital femoral epiphysis.  . INTRAOPERATIVE TRANSESOPHAGEAL ECHOCARDIOGRAM N/A 08/14/2013   Normal LV  function. Procedure: INTRAOPERATIVE TRANSESOPHAGEAL ECHOCARDIOGRAM;  Surgeon: Melrose Nakayama, MD;  Location: [facility];  Service: Open Heart Surgery;  Laterality: N/A;  . LEFT HEART CATHETERIZATION WITH CORONARY ANGIOGRAM N/A 08/08/2013   Procedure: LEFT HEART CATHETERIZATION WITH CORONARY ANGIOGRAM;  Surgeon: Jettie Booze, MD;  Location: [facility] CATH LAB;  Service: Cardiovascular;  Laterality: N/A;  . PATELLA FRACTURE SURGERY Left ~ 1979   bolt + 3 screws to repair tib plateau fx  . PERICARDIAL TAP N/A 07/01/2013   Procedure: PERICARDIAL TAP;  Surgeon: Jettie Booze, MD;  [facility] LAB;  Service: Cardiovascular;  Laterality: N/A;  . TESTICLE SURGERY  as a child   Undescended testicle brought down into scrotum  . TONSILLECTOMY  1947  . TOTAL HIP ARTHROPLASTY Right 08/12/2012   Procedure: REMOVAL OF OLD PINS RIGHT HIP AND RIGHT TOTAL HIP ARTHROPLASTY ANTERIOR APPROACH;  Surgeon: Mcarthur Rossetti, MD;  [facility];  Service: Orthopedics;  Laterality: Right;  . TRANSTHORACIC ECHOCARDIOGRAM  10/30/14   Mod LVH, EF 60-65%, normal wall motion, mod mitral regurg, mild PAH    Outpatient Medications Prior to Visit  Medication Sig Dispense Refill  . ACCU-CHEK AVIVA PLUS test strip Reported on 02/26/2015 100 each 11  . acetaminophen (TYLENOL) 325 MG tablet Take 2 tablets (650 mg total) by mouth every 4 (four) hours as needed for headache or mild pain.    [name] albuterol (PROVENTIL HFA;VENTOLIN HFA) 108 (90 Base) MCG/ACT inhaler Inhale 2 puffs into the lungs every 6 (six) hours as needed for wheezing or shortness of breath. 1 Inhaler 6  . amLODipine (NORVASC) 5 MG tablet Take 1 tablet (5 mg total) by mouth daily. 30 tablet 11  . aspirin 81 MG tablet Take 81 mg by mouth at bedtime. Reported on 02/26/2015    . budesonide-formoterol (SYMBICORT) 160-4.5 MCG/ACT inhaler Inhale 2 puffs into the lungs 2 (two) times daily. 1 Inhaler 12  . buPROPion (WELLBUTRIN SR) 150 MG 12 hr tablet Take 1  tablet by mouth two  times daily 60 tablet 3  . chlorthalidone (HYGROTON) 25 MG tablet Take by mouth daily. Reported on 02/26/2015    . Cholecalciferol (VITAMIN D-3) 1000 units CAPS Take 1,000 Units by mouth daily. Reported on 02/26/2015    . Chromium Picolinate 1000 MCG TABS Take 1,000 mcg by mouth daily. Reported on 02/26/2015    . citalopram (CELEXA) 40 MG tablet Take 1 tablet (40 mg total) by mouth daily. 90 tablet 3  . diltiazem (CARDIZEM CD) 240 MG 24 hr capsule Take 1 capsule (240 mg total) by mouth daily. 30 capsule 1  . fluticasone (FLONASE) 50 MCG/ACT nasal spray Place 2 sprays into both nostrils daily. 16 g 1  . furosemide (LASIX) 40 MG tablet 20 mg daily.     [name] gabapentin (NEURONTIN) 100 MG capsule 2 caps po tid 180 capsule 4  . glimepiride (AMARYL) 4 MG tablet TAKE TWO TABLETS  BY MOUTH EVERY MORNING WITH BREAKFAST 60 tablet 3  . guaiFENesin (MUCINEX) 600 MG 12 hr tablet Take 2 tablets (1,200 mg total) by mouth 2 (two) times daily. 30 tablet 0  . Insulin Detemir (LEVEMIR FLEXTOUCH) 100 UNIT/ML Pen Inject 55 Units into the  skin at bedtime. (Patient taking differently: Inject 65 Units into the skin at bedtime. ) 45 mL 3  . insulin regular (NOVOLIN R,HUMULIN R) 100 units/mL injection Inject 12 units subcutaneously about 20 minutes prior to eating 1 vial 6  . isosorbide mononitrate (IMDUR) 30 MG 24 hr tablet Take 1 tablet by mouth  daily 90 tablet 3  . Multiple Vitamin (MULTIVITAMIN WITH MINERALS) TABS Take 1 tablet by mouth daily.    . nitroGLYCERIN (NITROSTAT) 0.4 MG SL tablet Place 1 tablet (0.4 mg total) under the tongue every 5 (five) minutes as needed for chest pain. 50 tablet 3  . pioglitazone (ACTOS) 45 MG tablet Take by mouth daily. Reported on 02/26/2015    . PROAIR HFA 108 (90 Base) MCG/ACT inhaler INHALE 2 PUFFS INTO THE LUNGS EVERY 4 HOURS AS NEEDED FOR WHEEZING ORSHORTNESS OF BREATH 8.5 g 6  . ramipril (ALTACE) 10 MG capsule Take 10 mg by mouth 2 (two) times daily.     .  rivaroxaban (XARELTO) 20 MG TABS tablet Take 1 tablet (20 mg total) by mouth daily with supper. 90 tablet 4  . tamsulosin (FLOMAX) 0.4 MG CAPS capsule TAKE 1 CAPSULE BY MOUTH EVERY DAY AFTER SUPPER 90 capsule 1  . varenicline (CHANTIX CONTINUING MONTH PAK) 1 MG tablet Take 1 tablet (1 mg total) by mouth 2 (two) times daily. 60 tablet 1  . varenicline (CHANTIX STARTING MONTH PAK) 0.5 MG X 11 & 1 MG X 42 tablet Take one 0.5 mg tablet by mouth once daily for 3 days, then increase to one 0.5 mg tablet twice daily for 4 days, then increase to one 1 mg tablet twice daily. (Patient not taking: Reported on 08/21/2015) 53 tablet 0   No facility-administered medications prior to visit.     Allergies  Allergen Reactions  . Morphine And Related Other (See Comments)    Drenched with perspiration  . Demerol [Meperidine] Nausea Only  . Starlix [Nateglinide] Other (See Comments)    gassy   ROS As per HPI  PE: Blood pressure 128/72, pulse 80, temperature 98.3 F (36.8 C), temperature source Oral, resp. rate 16, height 5' 11.5" (1.816 m), weight 247 lb 4 oz (112.2 kg), SpO2 94 %. Gen: Alert, well appearing.  Patient is oriented to person, place, time, and situation. CV: RRR, no m/r/g. LUNGS: CTA bilat, diminished BS throughout all lung fields, mild prolongation of exp phase, nonlabored resps. EXT: 1+ pitting edema of L LL, no edema in R LL.  No cyanosis or edema.  LABS:  Lab Results  Component Value Date   TSH 0.918 07/01/2013   Lab Results  Component Value Date   WBC 7.1 05/17/2015   HGB 12.2 (L) 05/17/2015   HCT 37.6 (L) 05/17/2015   MCV 81.3 05/17/2015   PLT 229.0 05/17/2015   Lab Results  Component Value Date   IRON 21 (L) 06/30/2013   FERRITIN 200.6 06/30/2013    Lab Results  Component Value Date   CREATININE 0.95 05/17/2015   BUN 21 05/17/2015   NA 136 05/17/2015   K 5.2 (H) 05/17/2015   CL 104 05/17/2015   CO2 24 05/17/2015   Lab Results  Component Value Date   ALT  21  11/30/2014   AST 20 11/30/2014   ALKPHOS 60 11/30/2014   BILITOT 0.5 11/30/2014   Lab Results  Component Value Date   CHOL 161 04/17/2014   Lab Results  Component Value Date   HDL 47.60 04/17/2014   Lab Results  Component Value Date   LDLCALC 63 07/04/2013   Lab Results  Component Value Date   TRIG 218.0 (H) 04/17/2014   Lab Results  Component Value Date   CHOLHDL 3 04/17/2014   Lab Results  Component Value Date   PSA 1.33 09/01/2011   Lab Results  Component Value Date   HGBA1C 8.8 (H) 05/17/2015   IMPRESSION AND PLAN:  1) DM 2, still with poor control primarily in evenings per home glucose monitoring. Will wait and see what his A1c is today before making changes in insulin dosing.  2) HTN; The current medical regimen is effective;  continue present plan and medications.  3) Hx of borderline hyperkalemia, plus CRI stage II: BMET today.  4) HLD: unable to monitor lipid panel b/c pt not able to come in for fasting labs.  Last LDL in 2015 was good.  Continue lovastatin 54m bid.  5) Tob dependence: doing well with chantix x 1 mo now.  Continue this.  6) Hx of IDA, with 2 small bowel AVMs documented in 2014: pt needs q627moBC checks lifetime.  This was stable 3 mo ago so we'll check this again in 3 mo.  An After Visit Summary was printed and given to the patient.  FOLLOW UP: Return in about 3 months (around 11/21/2015) for routine chronic illness f/u.  Signed:  PhCrissie SicklesMD           08/21/2015

## 2015-08-22 ENCOUNTER — Encounter: Payer: Self-pay | Admitting: Family Medicine

## 2015-09-02 ENCOUNTER — Other Ambulatory Visit: Payer: Self-pay | Admitting: Licensed Clinical Social Worker

## 2015-09-02 NOTE — Patient Outreach (Signed)
Assessment:  CSW spoke via phone with Joshua [name], spouse of client, on 8/28/217. CSW verified identity of Joshua [name]. Joshua [name] and CSW spoke of client needs.Client has prescribed medications and is taking medications as prescribed. Client is eating well and sleeping well. Client sees Dr. Anitra [name] as his primary care doctor. CSW and Joshua [name] spoke of client care plan. CSW encouraged that client attend all scheduled client medical appointments in next 30 days. CSW encouraged that client continue to use relaxation techniques to help client manage anxiety and stress issues. Client is driving himself to his scheduled medical appointments.  Client likes to be outdoors on sunny days to enjoy weather.  Client has family members who call him or visit.  CSW thanked [employer] for phone call with CSW. CSW encouraged that client or Joshua [name] call CSW at [phone] as needed to discus social work needs of client.   Plan:  Client to use relaxation techniques in next 30 days to help client manage anxiety or stress issues faced.  CSW to call client or Joshua [name] in 4 weeks to assess client needs.  Joshua [name].Joshua [name] MSW, LCSW Licensed Clinical Social Worker [facility] [phone]

## 2015-09-16 ENCOUNTER — Other Ambulatory Visit: Payer: Self-pay | Admitting: Family Medicine

## 2015-09-18 DIAGNOSIS — M7072 Other bursitis of hip, left hip: Secondary | ICD-10-CM | POA: Diagnosis not present

## 2015-09-23 DIAGNOSIS — M7062 Trochanteric bursitis, left hip: Secondary | ICD-10-CM | POA: Diagnosis not present

## 2015-09-26 ENCOUNTER — Ambulatory Visit (INDEPENDENT_AMBULATORY_CARE_PROVIDER_SITE_OTHER): Payer: Medicare Other | Admitting: Family Medicine

## 2015-09-26 ENCOUNTER — Encounter: Payer: Self-pay | Admitting: Family Medicine

## 2015-09-26 VITALS — BP 136/76 | HR 90 | Temp 97.9°F | Resp 20 | Wt 248.8 lb

## 2015-09-26 DIAGNOSIS — Z720 Tobacco use: Secondary | ICD-10-CM | POA: Diagnosis not present

## 2015-09-26 DIAGNOSIS — J441 Chronic obstructive pulmonary disease with (acute) exacerbation: Secondary | ICD-10-CM | POA: Diagnosis not present

## 2015-09-26 DIAGNOSIS — Z23 Encounter for immunization: Secondary | ICD-10-CM

## 2015-09-26 MED ORDER — METHYLPREDNISOLONE ACETATE 80 MG/ML IJ SUSP
80.0000 mg | Freq: Once | INTRAMUSCULAR | Status: AC
  Administered 2015-09-26: 80 mg via INTRAMUSCULAR

## 2015-09-26 MED ORDER — PREDNISONE 20 MG PO TABS: ORAL_TABLET | ORAL | 0 refills | Status: DC

## 2015-09-26 MED ORDER — AZITHROMYCIN 250 MG PO TABS: ORAL_TABLET | ORAL | 0 refills | Status: DC

## 2015-09-26 NOTE — Addendum Note (Signed)
Addended by: Gordy Councilman on: 09/26/2015 12:17 PM   Modules accepted: Orders

## 2015-09-26 NOTE — Progress Notes (Signed)
Pre visit review using our clinic review tool, if applicable. No additional management support is needed unless otherwise documented below in the visit note

## 2015-09-26 NOTE — Progress Notes (Signed)
OFFICE VISIT  09/26/2015   CC:  Chief Complaint  Patient presents with  . Cough    Productive cough x 1 week    HPI:    Patient is a 75 y.o. Caucasian male who presents for cough. Onset a week ago, lots of harsh coughing --dry.  Feels mild SOB with this, says not progressively worsening though.  +wheezing. No fever.  No nasal sx's, no ST, no HA.  Eating and drinking well. Last took 2p albut MDI about 6 hours ago and it did help some.  Unfortunately he does still continue to smoke.  Past Medical History:  Diagnosis Date  . Adenomatous colon polyp 10/16/2011   Repeat 2018  . Arthritis     Hips, R>L & KNEES  . CAD, multiple vessel    3V CAD cath 08/08/13----CABG done shortly after.  . 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 pylori neg, no dysplasia or malignancy.  . Chronic diastolic heart failure ([place]) 2016  . Chronic renal insufficiency, stage II (mild)   . COPD (chronic obstructive pulmonary disease) ([place])    GOLD II.  Spirometry  2004 borderline obstruction; 2015 mod obst  . Diabetic nephropathy (HCC)    Elevated urine microalb/cr 03/2011  . DM type 2 (diabetes mellitus, type 2) (HCC)    Poor control on max oral meds--pt eventually agreed to insulin therap  . DOE (dyspnea on exertion)    COPD + chronic diastolic HF  . Erectile dysfunction    Normal testosterone  . Hearing loss of both ears 2016   Hearing aids  . Hyperlipidemia   . Hyperplastic colon polyp 2001  . Hypertension   . Iron deficiency anemia 2014   03/2012 capsule endoscopy showed 2 AVMs--likely responsible for his IDA--lifetime iron supp recommended + q35moCBCs.  . Macular degeneration, dry    Mild, bilat (Optometrist, [place]  . Obesity   . Other and unspecified angina pectoris   . PAF (paroxysmal atrial fibrillation) ([place] 10/2014   xarelto  . Pericardial effusion with cardiac tamponade 6//29/15   pericardiocentesis  was done,  Infectious/inflamm (cytology showed NO MALIGNANT CELLS)  . Pneumonia   . Tobacco dependence in remission    100+ pack-yr hx: quit after CABG    Past Surgical History:  Procedure Laterality Date  . APPENDECTOMY  1957  . CARDIAC CATHETERIZATION  08/08/2013  . CATARACT EXTRACTION W/ INTRAOCULAR LENS  IMPLANT, BILATERAL  04/08/2006 & 04/22/2006  . CATARACT EXTRACTION W/ INTRAOCULAR LENS  IMPLANT, BILATERAL Bilateral   . CHOLECYSTECTOMY OPEN  1999  . COLONOSCOPY  10/16/2011   Procedure: COLONOSCOPY;  Surgeon: RInda Castle MD;  [facility];  Service: Endoscopy;  Laterality: N/A;  . CORONARY ARTERY BYPASS GRAFT N/A 08/14/2013   Procedure: CORONARY ARTERY BYPASS GRAFTING (CABG);  Surgeon: SMelrose Nakayama MD;  [address]  Service: Open Heart Surgery;  Laterality: N/A;  Times 4   using left internal mammary artery and endoscopically harvested bilateral saphenous vein  . ESOPHAGOGASTRODUODENOSCOPY  02/25/12   Atrophic gastritis with a few erosions--capsule endoscopy planned as of 02/25/12 (Dr. KDeatra Ina.  .Marland KitchenHARDWARE REMOVAL Right 08/12/2012   Procedure: HARDWARE REMOVAL;  Surgeon: CMcarthur Rossetti MD;  [facility];  Service: Orthopedics;  Laterality: Right;  . HIP SURGERY Right 1954   Repair of slipped capital femoral epiphysis.  . INTRAOPERATIVE TRANSESOPHAGEAL ECHOCARDIOGRAM N/A 08/14/2013   Normal LV function. Procedure: INTRAOPERATIVE TRANSESOPHAGEAL  ECHOCARDIOGRAM;  Surgeon: Melrose Nakayama, MD;  Location: [facility];  Service: Open Heart Surgery;  Laterality: N/A;  . LEFT HEART CATHETERIZATION WITH CORONARY ANGIOGRAM N/A 08/08/2013   Procedure: LEFT HEART CATHETERIZATION WITH CORONARY ANGIOGRAM;  Surgeon: Jettie Booze, MD;  Location[facility] CATH LAB;  Service: Cardiovascular;  Laterality: N/A;  . PATELLA FRACTURE SURGERY Left ~ 1979   bolt + 3 screws to repair tib plateau fx  . PERICARDIAL TAP N/A 07/01/2013   Procedure: PERICARDIAL TAP;  Surgeon:  Jettie Booze, MD;  [facility] C[facility] LAB;  Service: Cardiovascular;  Laterality: N/A;  . TESTICLE SURGERY  as a child   Undescended testicle brought down into scrotum  . TONSILLECTOMY  1947  . TOTAL HIP ARTHROPLASTY Right 08/12/2012   Procedure: REMOVAL OF OLD PINS RIGHT HIP AND RIGHT TOTAL HIP ARTHROPLASTY ANTERIOR APPROACH;  Surgeon: Mcarthur Rossetti, MD;  [facility];  Service: Orthopedics;  Laterality: Right;  . TRANSTHORACIC ECHOCARDIOGRAM  10/30/14   Mod LVH, EF 60-65%, normal wall motion, mod mitral regurg, mild PAH    Outpatient Medications Prior to Visit  Medication Sig Dispense Refill  . ACCU-CHEK AVIVA PLUS test strip Reported on 02/26/2015 100 each 11  . acetaminophen (TYLENOL) 325 MG tablet Take 2 tablets (650 mg total) by mouth every 4 (four) hours as needed for headache or mild pain.    [name] albuterol (PROVENTIL HFA;VENTOLIN HFA) 108 (90 Base) MCG/ACT inhaler Inhale 2 puffs into the lungs every 6 (six) hours as needed for wheezing or shortness of breath. 1 Inhaler 6  . amLODipine (NORVASC) 5 MG tablet Take 1 tablet (5 mg total) by mouth daily. 30 tablet 11  . aspirin 81 MG tablet Take 81 mg by mouth at bedtime. Reported on 02/26/2015    . budesonide-formoterol (SYMBICORT) 160-4.5 MCG/ACT inhaler Inhale 2 puffs into the lungs 2 (two) times daily. 1 Inhaler 12  . buPROPion (WELLBUTRIN SR) 150 MG 12 hr tablet Take 1 tablet by mouth two  times daily 60 tablet 3  . CHANTIX CONTINUING MONTH PAK 1 MG tablet TAKE ONE TABLET BY MOUTH TWICE DAILY 60 tablet 0  . chlorthalidone (HYGROTON) 25 MG tablet Take by mouth daily. Reported on 02/26/2015    . Cholecalciferol (VITAMIN D-3) 1000 units CAPS Take 1,000 Units by mouth daily. Reported on 02/26/2015    . Chromium Picolinate 1000 MCG TABS Take 1,000 mcg by mouth daily. Reported on 02/26/2015    . citalopram (CELEXA) 40 MG tablet Take 1 tablet (40 mg total) by mouth daily. 90 tablet 3  . diltiazem (CARDIZEM CD) 240 MG 24 hr capsule  Take 1 capsule (240 mg total) by mouth daily. 30 capsule 1  . fluticasone (FLONASE) 50 MCG/ACT nasal spray Place 2 sprays into both nostrils daily. 16 g 1  . furosemide (LASIX) 40 MG tablet 20 mg daily.     [name] gabapentin (NEURONTIN) 100 MG capsule 2 caps po tid 180 capsule 4  . glimepiride (AMARYL) 4 MG tablet TAKE TWO TABLETS BY MOUTH EVERY MORNING WITH BREAKFAST 60 tablet 3  . guaiFENesin (MUCINEX) 600 MG 12 hr tablet Take 2 tablets (1,200 mg total) by mouth 2 (two) times daily. 30 tablet 0  . Insulin Detemir (LEVEMIR FLEXTOUCH) 100 UNIT/ML Pen Inject 55 Units into the  skin at bedtime. (Patient taking differently: Inject 65 Units into the skin at bedtime. ) 45 mL 3  . insulin regular (NOVOLIN R,HUMULIN R) 100 units/mL injection Inject 12 units subcutaneously about 20 minutes  prior to eating 1 vial 6  . isosorbide mononitrate (IMDUR) 30 MG 24 hr tablet Take 1 tablet by mouth  daily 90 tablet 3  . lovastatin (MEVACOR) 40 MG tablet Take 40 mg by mouth 2 (two) times daily.    . Multiple Vitamin (MULTIVITAMIN WITH MINERALS) TABS Take 1 tablet by mouth daily.    . nitroGLYCERIN (NITROSTAT) 0.4 MG SL tablet Place 1 tablet (0.4 mg total) under the tongue every 5 (five) minutes as needed for chest pain. 50 tablet 3  . pioglitazone (ACTOS) 45 MG tablet Take by mouth daily. Reported on 02/26/2015    . PROAIR HFA 108 (90 Base) MCG/ACT inhaler INHALE 2 PUFFS INTO THE LUNGS EVERY 4 HOURS AS NEEDED FOR WHEEZING ORSHORTNESS OF BREATH 8.5 g 6  . ramipril (ALTACE) 10 MG capsule Take 10 mg by mouth 2 (two) times daily.     . rivaroxaban (XARELTO) 20 MG TABS tablet Take 1 tablet (20 mg total) by mouth daily with supper. 90 tablet 4  . tamsulosin (FLOMAX) 0.4 MG CAPS capsule TAKE 1 CAPSULE BY MOUTH EVERY DAY AFTER SUPPER 90 capsule 1   No facility-administered medications prior to visit.     Allergies  Allergen Reactions  . Morphine And Related Other (See Comments)    Drenched with perspiration  . Demerol  [Meperidine] Nausea Only  . Starlix [Nateglinide] Other (See Comments)    gassy    ROS As per HPI  PE: Blood pressure 136/76, pulse 90, temperature 97.9 F (36.6 C), temperature source Oral, resp. rate 20, weight 248 lb 12.8 oz (112.9 kg), SpO2 92 %.Room air.  His weight is up only 1.8 lbs compared to his last f/u visit. Gen: Alert, well appearing.  Patient is oriented to person, place, time, and situation. AFFECT: pleasant, lucid thought and speech. ENT: Ears: EACs clear, normal epithelium.  TMs with good light reflex and landmarks bilaterally.  Eyes: no injection, icteris, swelling, or exudate.  EOMI, PERRLA. Nose: no drainage or turbinate edema/swelling.  No injection or focal lesion.  Mouth: lips without lesion/swelling.  Oral mucosa pink and moist.  Dentition intact and without obvious caries or gingival swelling.  Oropharynx without erythema, exudate, or swelling.  CV: RRR, no m/r/g LUNGS: clear on inspiration, mild diffuse exp wheezing with diffusely diminished aeration and with mildly prolonged exp phase.  Minimally labored breathing. EXT: 2+ pitting edema L LE, 1+ pittinge edema R LE. No cyanosis.  LABS:  None today  IMPRESSION AND PLAN:  COPD exacerbation: Alb/atr neb in office today. Depo-medrol 80 mg IM today. Z-pack rx'd. Prednisone taper starting tomorrow: 82m qd x 5d, then 275mqd x 5d. Continue albuterol MDI q4h prn and get otc generic robitussin DM. Encouraged complete smoking cessation. Flu vaccine given today.  An After Visit Summary was printed and given to the patient.  FOLLOW UP: Return for keep appt scheduled for tomorrow morning.  Signed:  PhCrissie SicklesMD           09/26/2015

## 2015-09-27 ENCOUNTER — Ambulatory Visit (INDEPENDENT_AMBULATORY_CARE_PROVIDER_SITE_OTHER): Payer: Medicare Other | Admitting: Family Medicine

## 2015-09-27 ENCOUNTER — Encounter: Payer: Self-pay | Admitting: Family Medicine

## 2015-09-27 VITALS — BP 134/70 | HR 82 | Temp 98.0°F | Resp 18 | Wt 247.8 lb

## 2015-09-27 DIAGNOSIS — E118 Type 2 diabetes mellitus with unspecified complications: Secondary | ICD-10-CM

## 2015-09-27 DIAGNOSIS — J441 Chronic obstructive pulmonary disease with (acute) exacerbation: Secondary | ICD-10-CM | POA: Diagnosis not present

## 2015-09-27 DIAGNOSIS — Z794 Long term (current) use of insulin: Secondary | ICD-10-CM | POA: Diagnosis not present

## 2015-09-27 DIAGNOSIS — M7062 Trochanteric bursitis, left hip: Secondary | ICD-10-CM | POA: Diagnosis not present

## 2015-09-27 MED ORDER — INSULIN REGULAR HUMAN 100 UNIT/ML IJ SOLN: INTRAMUSCULAR | 6 refills | Status: DC

## 2015-09-27 MED ORDER — INSULIN DETEMIR 100 UNIT/ML FLEXPEN: PEN_INJECTOR | SUBCUTANEOUS | 3 refills | Status: DC

## 2015-09-27 NOTE — Patient Instructions (Signed)
Increase you levemir insulin to 73 U at bedtime and increase your mealtime Novolin R insulin to 16 Units at breakfast, 17 Units at lunch, and 18 Units at supper.

## 2015-09-27 NOTE — Progress Notes (Signed)
OFFICE VISIT  09/27/2015   CC:  Chief Complaint  Patient presents with  . Follow-up    HPI:    Patient is a 75 y.o. Caucasian male who presents for 1 mo f/u DM 2 with poor control, also f/u recent COPD exacerbation that I saw him yesterday for. One month ago we increased his levemir to 65 U qhs and novolin R to 15 U with each meal.  We stopped glimeperide.  His A1c was 9.2% at that time.  Yesterday I saw him for cough/SOB/wheeze, started him on systemic steroids and Z-pack.  His weight is stable. His breathing feels much improved, cough and wheeze much improved.  No fevers.  Glucoses: fastings range from low 100s up to occasional mid 200s if he eats something bad. When he checks it at bedtime it is mostly in mid 200s.  He is taking his novolin R right after he eats even though he knows to take it before.  Past Medical History:  Diagnosis Date  . Adenomatous colon polyp 10/16/2011   Repeat 2018  . Arthritis     Hips, R>L & KNEES  . CAD, multiple vessel    3V CAD cath 08/08/13----CABG done shortly after.  . 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 pylori neg, no dysplasia or malignancy.  . Chronic diastolic heart failure ([place]) 2016  . Chronic renal insufficiency, stage II (mild)   . COPD (chronic obstructive pulmonary disease) ([place])    GOLD II.  Spirometry  2004 borderline obstruction; 2015 mod obst  . Diabetic nephropathy (HCC)    Elevated urine microalb/cr 03/2011  . DM type 2 (diabetes mellitus, type 2) (HCC)    Poor control on max oral meds--pt eventually agreed to insulin therap  . DOE (dyspnea on exertion)    COPD + chronic diastolic HF  . Erectile dysfunction    Normal testosterone  . Hearing loss of both ears 2016   Hearing aids  . Hyperlipidemia   . Hyperplastic colon polyp 2001  . Hypertension   . Iron deficiency anemia 2014   03/2012 capsule endoscopy showed 2 AVMs--likely responsible for his IDA--lifetime iron supp recommended  + q4moCBCs.  . Macular degeneration, dry    Mild, bilat (Optometrist, [place] [employer] [place]  . Obesity   . Other and unspecified angina pectoris   . PAF ([place]oxysmal [place] fibrillation) [place] 10/2014   xarelto  . Pericardial effusion with cardiac tamponade 6//29/15   pericardiocentesis was done,  Infectious/inflamm (cytology showed NO MALIGNANT CELLS)  . Pneumonia   . Tobacco dependence in remission    100+ pack-yr hx: quit after CABG    Past Surgical History:  Procedure Laterality Date  . APPENDECTOMY  1957  . CARDIAC CATHETERIZATION  08/08/2013  . CATARACT EXTRACTION W/ INTRAOCULAR LENS  IMPLANT, BILATERAL  04/08/2006 & 04/22/2006  . CATARACT EXTRACTION W/ INTRAOCULAR LENS  IMPLANT, BILATERAL Bilateral   . CHOLECYSTECTOMY OPEN  1999  . COLONOSCOPY  10/16/2011   Procedure: COLONOSCOPY;  Surgeon: RInda Castle MD;  Location: [facility];  Service: Endoscopy;  Laterality: N/A;  . CORONARY ARTERY BYPASS GRAFT N/A 08/14/2013   Procedure: CORONARY ARTERY BYPASS GRAFTING (CABG);  Surgeon: SMelrose Nakayama MD;  [facility]  Service: Open Heart Surgery;  Laterality: N/A;  Times 4   using left internal mammary artery and endoscopically harvested bilateral saphenous vein  . ESOPHAGOGASTRODUODENOSCOPY  02/25/12   Atrophic gastritis with a few erosions--capsule  endoscopy planned as of 02/25/12 (Dr. Deatra Ina).  [name] HARDWARE REMOVAL Right 08/12/2012   Procedure: HARDWARE REMOVAL;  Surgeon: Mcarthur Rossetti, MD;  Location: [facility];  Service: Orthopedics;  Laterality: Right;  . HIP SURGERY Right 1954   Repair of slipped capital femoral epiphysis.  . INTRAOPERATIVE TRANSESOPHAGEAL ECHOCARDIOGRAM N/A 08/14/2013   Normal LV function. Procedure: INTRAOPERATIVE TRANSESOPHAGEAL ECHOCARDIOGRAM;  Surgeon: Melrose Nakayama, MD;  Location: [facility];  Service: Open Heart Surgery;  Laterality: N/A;  . LEFT HEART CATHETERIZATION WITH CORONARY ANGIOGRAM N/A 08/08/2013    Procedure: LEFT HEART CATHETERIZATION WITH CORONARY ANGIOGRAM;  Surgeon: Jettie Booze, MD;  Location: [facility] CATH LAB;  Service: Cardiovascular;  Laterality: N/A;  . PATELLA FRACTURE SURGERY Left ~ 1979   bolt + 3 screws to repair tib plateau fx  . PERICARDIAL TAP N/A 07/01/2013   Procedure: PERICARDIAL TAP;  Surgeon: Jettie Booze, MD;  Location: [facility] C[facility] LAB;  Service: Cardiovascular;  Laterality: N/A;  . TESTICLE SURGERY  as a child   Undescended testicle brought down into scrotum  . TONSILLECTOMY  1947  . TOTAL HIP ARTHROPLASTY Right 08/12/2012   Procedure: REMOVAL OF OLD PINS RIGHT HIP AND RIGHT TOTAL HIP ARTHROPLASTY ANTERIOR APPROACH;  Surgeon: Mcarthur Rossetti, MD;  Location: [facility];  Service: Orthopedics;  Laterality: Right;  . TRANSTHORACIC ECHOCARDIOGRAM  10/30/14   Mod LVH, EF 60-65%, normal wall motion, mod mitral regurg, mild PAH    Outpatient Medications Prior to Visit  Medication Sig Dispense Refill  . ACCU-CHEK AVIVA PLUS test strip Reported on 02/26/2015 100 each 11  . acetaminophen (TYLENOL) 325 MG tablet Take 2 tablets (650 mg total) by mouth every 4 (four) hours as needed for headache or mild pain.    [name] albuterol (PROVENTIL HFA;VENTOLIN HFA) 108 (90 Base) MCG/ACT inhaler Inhale 2 puffs into the lungs every 6 (six) hours as needed for wheezing or shortness of breath. 1 Inhaler 6  . amLODipine (NORVASC) 5 MG tablet Take 1 tablet (5 mg total) by mouth daily. 30 tablet 11  . aspirin 81 MG tablet Take 81 mg by mouth at bedtime. Reported on 02/26/2015    . azithromycin (ZITHROMAX) 250 MG tablet 2 tabs po qd x 1d, then 1 tab po qd x 4d 6 tablet 0  . budesonide-formoterol (SYMBICORT) 160-4.5 MCG/ACT inhaler Inhale 2 puffs into the lungs 2 (two) times daily. 1 Inhaler 12  . buPROPion (WELLBUTRIN SR) 150 MG 12 hr tablet Take 1 tablet by mouth two  times daily 60 tablet 3  . CHANTIX CONTINUING MONTH PAK 1 MG tablet TAKE ONE TABLET BY MOUTH TWICE DAILY 60 tablet 0  .  chlorthalidone (HYGROTON) 25 MG tablet Take by mouth daily. Reported on 02/26/2015    . Cholecalciferol (VITAMIN D-3) 1000 units CAPS Take 1,000 Units by mouth daily. Reported on 02/26/2015    . Chromium Picolinate 1000 MCG TABS Take 1,000 mcg by mouth daily. Reported on 02/26/2015    . citalopram (CELEXA) 40 MG tablet Take 1 tablet (40 mg total) by mouth daily. 90 tablet 3  . diltiazem (CARDIZEM CD) 240 MG 24 hr capsule Take 1 capsule (240 mg total) by mouth daily. 30 capsule 1  . fluticasone (FLONASE) 50 MCG/ACT nasal spray Place 2 sprays into both nostrils daily. 16 g 1  . furosemide (LASIX) 40 MG tablet 20 mg daily.     [name] gabapentin (NEURONTIN) 100 MG capsule 2 caps po tid 180 capsule 4  . glimepiride (AMARYL) 4 MG tablet  TAKE TWO TABLETS BY MOUTH EVERY MORNING WITH BREAKFAST 60 tablet 3  . guaiFENesin (MUCINEX) 600 MG 12 hr tablet Take 2 tablets (1,200 mg total) by mouth 2 (two) times daily. 30 tablet 0  . isosorbide mononitrate (IMDUR) 30 MG 24 hr tablet Take 1 tablet by mouth  daily 90 tablet 3  . lovastatin (MEVACOR) 40 MG tablet Take 40 mg by mouth 2 (two) times daily.    . Multiple Vitamin (MULTIVITAMIN WITH MINERALS) TABS Take 1 tablet by mouth daily.    . nitroGLYCERIN (NITROSTAT) 0.4 MG SL tablet Place 1 tablet (0.4 mg total) under the tongue every 5 (five) minutes as needed for chest pain. 50 tablet 3  . pioglitazone (ACTOS) 45 MG tablet Take by mouth daily. Reported on 02/26/2015    . predniSONE (DELTASONE) 20 MG tablet 2 tabs po qd x 5d, then 1 tab po qd x 5d 15 tablet 0  . PROAIR HFA 108 (90 Base) MCG/ACT inhaler INHALE 2 PUFFS INTO THE LUNGS EVERY 4 HOURS AS NEEDED FOR WHEEZING ORSHORTNESS OF BREATH 8.5 g 6  . ramipril (ALTACE) 10 MG capsule Take 10 mg by mouth 2 (two) times daily.     . rivaroxaban (XARELTO) 20 MG TABS tablet Take 1 tablet (20 mg total) by mouth daily with supper. 90 tablet 4  . tamsulosin (FLOMAX) 0.4 MG CAPS capsule TAKE 1 CAPSULE BY MOUTH EVERY DAY AFTER SUPPER  90 capsule 1  . Insulin Detemir (LEVEMIR FLEXTOUCH) 100 UNIT/ML Pen Inject 55 Units into the  skin at bedtime. (Patient taking differently: Inject 65 Units into the skin at bedtime. ) 45 mL 3  . insulin regular (NOVOLIN R,HUMULIN R) 100 units/mL injection Inject 12 units subcutaneously about 20 minutes prior to eating 1 vial 6   No facility-administered medications prior to visit.     Allergies  Allergen Reactions  . Morphine And Related Other (See Comments)    Drenched with perspiration  . Demerol [Meperidine] Nausea Only  . Starlix [Nateglinide] Other (See Comments)    gassy    ROS As per HPI  PE: Blood pressure 134/70, pulse 82, temperature 98 F (36.7 C), temperature source Oral, resp. rate 18, weight 247 lb 12.8 oz (112.4 kg), SpO2 91 %. Gen: Alert, well appearing.  Patient is oriented to person, place, time, and situation. AFFECT: pleasant, lucid thought and speech. CV: RRR, no m/r/g.   LUNGS: CTA bilat, nonlabored resps, mildly diminished aeration in all lung fields on expiration.  Exp phase mildly prolonged.  Nonlabored resps. EXT: 2+ L LE pitting edema, 1+ R LE pitting edema   LABS:  Lab Results  Component Value Date   HGBA1C 9.2 (H) 08/21/2015     Chemistry      Component Value Date/Time   NA 139 08/21/2015 0845   K 4.4 08/21/2015 0845   CL 104 08/21/2015 0845   CO2 29 08/21/2015 0845   BUN 22 08/21/2015 0845   CREATININE 1.01 08/21/2015 0845   CREATININE 1.03 11/26/2014 1145      Component Value Date/Time   CALCIUM 9.1 08/21/2015 0845   ALKPHOS 60 11/30/2014 0441   AST 20 11/30/2014 0441   ALT 21 11/30/2014 0441   BILITOT 0.5 11/30/2014 0441     Lab Results  Component Value Date   CHOL 161 04/17/2014   HDL 47.60 04/17/2014   LDLCALC 63 07/04/2013   LDLDIRECT 77.0 04/17/2014   TRIG 218.0 (H) 04/17/2014   CHOLHDL 3 04/17/2014    IMPRESSION AND  PLAN:  1) COPD exacerbation: improving appropriately. Continue steroid taper, abx, nebs.  2) DM 2,  poor control, insulin-requiring. Glucoses perhaps improved a little over the last month but pt's memory not good enough to convince me of the accuracy of his glucoses lately.  Will increase insulins a little bit again.  Encouraged him to be more compliant with diet but he didn't give much indication that he would do this.  Plan: Increase you levemir insulin to 73 U at bedtime and increase your mealtime Novolin R insulin to 16 Units at breakfast, 17 Units at lunch, and 18 Units at supper.  An After Visit Summary was printed and given to the patient.  FOLLOW UP: Return in about 2 months (around 11/27/2015) for routine chronic illness f/u.  Signed:  Crissie Sickles, MD           09/27/2015

## 2015-09-27 NOTE — Progress Notes (Signed)
Pre visit review using our clinic review tool, if applicable. No additional management support is needed unless otherwise documented below in the visit note

## 2015-09-30 DIAGNOSIS — M7062 Trochanteric bursitis, left hip: Secondary | ICD-10-CM | POA: Diagnosis not present

## 2015-10-03 DIAGNOSIS — M7062 Trochanteric bursitis, left hip: Secondary | ICD-10-CM | POA: Diagnosis not present

## 2015-10-04 ENCOUNTER — Other Ambulatory Visit: Payer: Self-pay | Admitting: Licensed Clinical Social Worker

## 2015-10-04 NOTE — Patient Outreach (Signed)
Assessment:  CSW spoke via phone with Sonya Pucci, spouse of client on 10/04/15. CSW verified identity of Chau Sawin. [name] and CSW spoke of client needs. Client has prescribed medications and is taking medications as prescribed.Client is eating well and sleeping well. Client sees Dr. Anitra Lauth as primary care doctor.  Client had appointment with Dr. Anitra Lauth in September of 2017. Client will have another appointment with Dr. Anitra Lauth in November of 2017.  CSW and [name] spoke of client care plan. CSW encouraged that client attend all scheduled client medical appointments in next 30 days. CSW encouraged that client continue to use relaxation techniques to help client manage anxiety and stress symptoms. Client enjoys visiting with family members or friends. He likes to sit outdoors on sunny days. [name] Cadavid is very supportive of client. She said that client does sometimes get fatigued or short of breath and has to take rest breaks.  [name] said she thinks client is sleeping more during daytime hours.  She said client is now attending Outpatient Physical Therapy sessions. He attends these physical therapy sessions 2 times weekly.  He thinks that these physical  therapy sessions may be helping client.  Client did not mention any current pain issues.  Client wears glasses to help him with vision. Client has two hearing aids but sometimes does not wear hearing aids. Client ambulates well in his home.  Client uses a cane when he goes into the community.  CSW encouraged client or [name] to call CSW at [phone] as needed to discuss social work needs of client. CSW reminded [name] that [facility] program has support services for client in social work, nursing, and pharmacy area. CSW thanked [place] for phone conversation with CSW on 10/04/15.    Plan:  Client to attend all scheduled client medical appointments in next 30 days.  CSW to call client or spouse of client in 4 weeks to assess client needs.   [name].Reita Shindler  MSW, LCSW Licensed Clinical Social Worker [facility] Care Management [phone]

## 2015-10-07 DIAGNOSIS — M7062 Trochanteric bursitis, left hip: Secondary | ICD-10-CM | POA: Diagnosis not present

## 2015-10-10 DIAGNOSIS — M7062 Trochanteric bursitis, left hip: Secondary | ICD-10-CM | POA: Diagnosis not present

## 2015-10-14 ENCOUNTER — Other Ambulatory Visit: Payer: Self-pay | Admitting: Family Medicine

## 2015-10-16 ENCOUNTER — Ambulatory Visit (INDEPENDENT_AMBULATORY_CARE_PROVIDER_SITE_OTHER): Payer: Medicare Other | Admitting: Orthopaedic Surgery

## 2015-10-16 DIAGNOSIS — M7062 Trochanteric bursitis, left hip: Secondary | ICD-10-CM | POA: Diagnosis not present

## 2015-10-23 ENCOUNTER — Other Ambulatory Visit: Payer: Self-pay

## 2015-10-23 ENCOUNTER — Emergency Department (HOSPITAL_COMMUNITY): Payer: Medicare Other

## 2015-10-23 ENCOUNTER — Emergency Department (HOSPITAL_COMMUNITY): Admit: 2015-10-23 | Discharge: 2015-10-23 | Disposition: A | Discharge status: Home / Self Care | Payer: Medicare Other

## 2015-10-23 ENCOUNTER — Encounter (HOSPITAL_COMMUNITY): Payer: Self-pay | Admitting: Emergency Medicine

## 2015-10-23 ENCOUNTER — Inpatient Hospital Stay (HOSPITAL_COMMUNITY)
Admission: EM | Admit: 2015-10-23 | Discharge: 2015-10-26 | DRG: 190 | Disposition: A | Discharge status: Home / Self Care | Payer: Medicare Other | Attending: Internal Medicine | Admitting: Internal Medicine

## 2015-10-23 DIAGNOSIS — Z794 Long term (current) use of insulin: Secondary | ICD-10-CM

## 2015-10-23 DIAGNOSIS — M7989 Other specified soft tissue disorders: Secondary | ICD-10-CM | POA: Diagnosis not present

## 2015-10-23 DIAGNOSIS — J449 Chronic obstructive pulmonary disease, unspecified: Secondary | ICD-10-CM | POA: Diagnosis not present

## 2015-10-23 DIAGNOSIS — F329 Major depressive disorder, single episode, unspecified: Secondary | ICD-10-CM | POA: Diagnosis present

## 2015-10-23 DIAGNOSIS — I10 Essential (primary) hypertension: Secondary | ICD-10-CM | POA: Diagnosis present

## 2015-10-23 DIAGNOSIS — I130 Hypertensive heart and chronic kidney disease with heart failure and stage 1 through stage 4 chronic kidney disease, or unspecified chronic kidney disease: Secondary | ICD-10-CM | POA: Diagnosis present

## 2015-10-23 DIAGNOSIS — R0902 Hypoxemia: Secondary | ICD-10-CM | POA: Diagnosis not present

## 2015-10-23 DIAGNOSIS — M1990 Unspecified osteoarthritis, unspecified site: Secondary | ICD-10-CM | POA: Diagnosis present

## 2015-10-23 DIAGNOSIS — E1122 Type 2 diabetes mellitus with diabetic chronic kidney disease: Secondary | ICD-10-CM | POA: Diagnosis present

## 2015-10-23 DIAGNOSIS — L03314 Cellulitis of groin: Secondary | ICD-10-CM | POA: Diagnosis not present

## 2015-10-23 DIAGNOSIS — E118 Type 2 diabetes mellitus with unspecified complications: Secondary | ICD-10-CM | POA: Diagnosis present

## 2015-10-23 DIAGNOSIS — M79609 Pain in unspecified limb: Secondary | ICD-10-CM | POA: Diagnosis not present

## 2015-10-23 DIAGNOSIS — Z77090 Contact with and (suspected) exposure to asbestos: Secondary | ICD-10-CM | POA: Diagnosis not present

## 2015-10-23 DIAGNOSIS — Z7982 Long term (current) use of aspirin: Secondary | ICD-10-CM

## 2015-10-23 DIAGNOSIS — E785 Hyperlipidemia, unspecified: Secondary | ICD-10-CM | POA: Diagnosis present

## 2015-10-23 DIAGNOSIS — F419 Anxiety disorder, unspecified: Secondary | ICD-10-CM | POA: Diagnosis not present

## 2015-10-23 DIAGNOSIS — E1121 Type 2 diabetes mellitus with diabetic nephropathy: Secondary | ICD-10-CM | POA: Diagnosis present

## 2015-10-23 DIAGNOSIS — N182 Chronic kidney disease, stage 2 (mild): Secondary | ICD-10-CM | POA: Diagnosis present

## 2015-10-23 DIAGNOSIS — E1165 Type 2 diabetes mellitus with hyperglycemia: Secondary | ICD-10-CM | POA: Diagnosis present

## 2015-10-23 DIAGNOSIS — I2510 Atherosclerotic heart disease of native coronary artery without angina pectoris: Secondary | ICD-10-CM | POA: Diagnosis not present

## 2015-10-23 DIAGNOSIS — Z7901 Long term (current) use of anticoagulants: Secondary | ICD-10-CM

## 2015-10-23 DIAGNOSIS — Z96641 Presence of right artificial hip joint: Secondary | ICD-10-CM | POA: Diagnosis present

## 2015-10-23 DIAGNOSIS — R0602 Shortness of breath: Secondary | ICD-10-CM | POA: Diagnosis not present

## 2015-10-23 DIAGNOSIS — N179 Acute kidney failure, unspecified: Secondary | ICD-10-CM | POA: Diagnosis present

## 2015-10-23 DIAGNOSIS — H9193 Unspecified hearing loss, bilateral: Secondary | ICD-10-CM | POA: Diagnosis present

## 2015-10-23 DIAGNOSIS — Z951 Presence of aortocoronary bypass graft: Secondary | ICD-10-CM | POA: Diagnosis not present

## 2015-10-23 DIAGNOSIS — I5032 Chronic diastolic (congestive) heart failure: Secondary | ICD-10-CM | POA: Diagnosis not present

## 2015-10-23 DIAGNOSIS — J9621 Acute and chronic respiratory failure with hypoxia: Secondary | ICD-10-CM | POA: Diagnosis present

## 2015-10-23 DIAGNOSIS — R079 Chest pain, unspecified: Secondary | ICD-10-CM | POA: Diagnosis not present

## 2015-10-23 DIAGNOSIS — J441 Chronic obstructive pulmonary disease with (acute) exacerbation: Secondary | ICD-10-CM | POA: Diagnosis present

## 2015-10-23 DIAGNOSIS — I480 Paroxysmal atrial fibrillation: Secondary | ICD-10-CM | POA: Diagnosis not present

## 2015-10-23 DIAGNOSIS — Z6834 Body mass index (BMI) 34.0-34.9, adult: Secondary | ICD-10-CM

## 2015-10-23 DIAGNOSIS — M7062 Trochanteric bursitis, left hip: Secondary | ICD-10-CM | POA: Diagnosis not present

## 2015-10-23 DIAGNOSIS — F17200 Nicotine dependence, unspecified, uncomplicated: Secondary | ICD-10-CM | POA: Diagnosis present

## 2015-10-23 DIAGNOSIS — Z79899 Other long term (current) drug therapy: Secondary | ICD-10-CM

## 2015-10-23 DIAGNOSIS — F17210 Nicotine dependence, cigarettes, uncomplicated: Secondary | ICD-10-CM | POA: Diagnosis not present

## 2015-10-23 DIAGNOSIS — E669 Obesity, unspecified: Secondary | ICD-10-CM | POA: Diagnosis present

## 2015-10-23 DIAGNOSIS — R05 Cough: Secondary | ICD-10-CM | POA: Diagnosis not present

## 2015-10-23 DIAGNOSIS — F32A Depression, unspecified: Secondary | ICD-10-CM | POA: Diagnosis present

## 2015-10-23 LAB — CBC WITH DIFFERENTIAL/PLATELET
Basophils Absolute: 0 10*3/uL (ref 0.0–0.1)
Basophils Relative: 0 %
EOS ABS: 0.3 10*3/uL (ref 0.0–0.7)
EOS PCT: 4 %
HCT: 44 % (ref 39.0–52.0)
Hemoglobin: 13.7 g/dL (ref 13.0–17.0)
LYMPHS ABS: 2.2 10*3/uL (ref 0.7–4.0)
Lymphocytes Relative: 25 %
MCH: 24.9 pg — AB (ref 26.0–34.0)
MCHC: 31.1 g/dL (ref 30.0–36.0)
MCV: 80 fL (ref 78.0–100.0)
MONO ABS: 0.8 10*3/uL (ref 0.1–1.0)
Monocytes Relative: 9 %
Neutro Abs: 5.6 10*3/uL (ref 1.7–7.7)
Neutrophils Relative %: 62 %
PLATELETS: 170 10*3/uL (ref 150–400)
RBC: 5.5 MIL/uL (ref 4.22–5.81)
RDW: 17.1 % — AB (ref 11.5–15.5)
WBC: 8.9 10*3/uL (ref 4.0–10.5)

## 2015-10-23 LAB — URINALYSIS, ROUTINE W REFLEX MICROSCOPIC
BILIRUBIN URINE: NEGATIVE
GLUCOSE, UA: 250 mg/dL — AB
HGB URINE DIPSTICK: NEGATIVE
Ketones, ur: NEGATIVE mg/dL
Leukocytes, UA: NEGATIVE
Nitrite: NEGATIVE
PROTEIN: NEGATIVE mg/dL
Specific Gravity, Urine: 1.012 (ref 1.005–1.030)
pH: 5.5 (ref 5.0–8.0)

## 2015-10-23 LAB — TROPONIN I: Troponin I: <0.03 ng/mL (ref <0.03)

## 2015-10-23 LAB — BASIC METABOLIC PANEL
Anion gap: 10 (ref 5–15)
BUN: 20 mg/dL (ref 6–20)
CALCIUM: 8.9 mg/dL (ref 8.9–10.3)
CO2: 21 mmol/L — ABNORMAL LOW (ref 22–32)
CREATININE: 1.07 mg/dL (ref 0.61–1.24)
Chloride: 107 mmol/L (ref 101–111)
GFR calc Af Amer: >60 mL/min (ref >60)
Glucose, Bld: 212 mg/dL — ABNORMAL HIGH (ref 65–99)
Potassium: 4.7 mmol/L (ref 3.5–5.1)
SODIUM: 138 mmol/L (ref 135–145)

## 2015-10-23 LAB — MAGNESIUM: Magnesium: 2.3 mg/dL (ref 1.7–2.4)

## 2015-10-23 LAB — BRAIN NATRIURETIC PEPTIDE: B NATRIURETIC PEPTIDE 5: 47.7 pg/mL (ref 0.0–100.0)

## 2015-10-23 MED ORDER — VITAMIN D 1000 UNITS PO TABS
1000.0000 [IU] | ORAL_TABLET | Freq: Every day | ORAL | Status: DC
  Administered 2015-10-24 – 2015-10-26 (×3): 1000 [IU] via ORAL
  Filled 2015-10-23 (×3): qty 1

## 2015-10-23 MED ORDER — ZOLPIDEM TARTRATE 5 MG PO TABS: 5.0000 mg | ORAL_TABLET | Freq: Every evening | ORAL | Status: DC | PRN

## 2015-10-23 MED ORDER — CITALOPRAM HYDROBROMIDE 20 MG PO TABS
40.0000 mg | ORAL_TABLET | Freq: Every day | ORAL | Status: DC
  Administered 2015-10-24 – 2015-10-26 (×3): 40 mg via ORAL
  Filled 2015-10-23 (×3): qty 2

## 2015-10-23 MED ORDER — RIVAROXABAN 20 MG PO TABS
20.0000 mg | ORAL_TABLET | Freq: Every day | ORAL | Status: DC
  Administered 2015-10-24 – 2015-10-25 (×2): 20 mg via ORAL
  Filled 2015-10-23 (×2): qty 1

## 2015-10-23 MED ORDER — CHLORTHALIDONE 25 MG PO TABS
25.0000 mg | ORAL_TABLET | Freq: Every day | ORAL | Status: DC
  Administered 2015-10-24 – 2015-10-26 (×3): 25 mg via ORAL
  Filled 2015-10-23 (×4): qty 1

## 2015-10-23 MED ORDER — DILTIAZEM HCL ER COATED BEADS 240 MG PO CP24
240.0000 mg | ORAL_CAPSULE | Freq: Every day | ORAL | Status: DC
Start: 2015-10-24 — End: 2015-10-26
  Administered 2015-10-24 – 2015-10-26 (×3): 240 mg via ORAL
  Filled 2015-10-23 (×3): qty 1

## 2015-10-23 MED ORDER — CEFAZOLIN IN D5W 1 GM/50ML IV SOLN
1.0000 g | Freq: Once | INTRAVENOUS | Status: AC
  Administered 2015-10-23: 1 g via INTRAVENOUS
  Filled 2015-10-23: qty 50

## 2015-10-23 MED ORDER — ALBUTEROL SULFATE (2.5 MG/3ML) 0.083% IN NEBU: 2.5000 mg | INHALATION_SOLUTION | RESPIRATORY_TRACT | Status: DC | PRN

## 2015-10-23 MED ORDER — GUAIFENESIN ER 600 MG PO TB12
1200.0000 mg | ORAL_TABLET | Freq: Two times a day (BID) | ORAL | Status: DC
  Administered 2015-10-24 – 2015-10-26 (×6): 1200 mg via ORAL
  Filled 2015-10-23 (×6): qty 2

## 2015-10-23 MED ORDER — HYDRALAZINE HCL 20 MG/ML IJ SOLN: 5.0000 mg | INTRAMUSCULAR | Status: DC | PRN

## 2015-10-23 MED ORDER — PRAVASTATIN SODIUM 40 MG PO TABS
40.0000 mg | ORAL_TABLET | Freq: Every day | ORAL | Status: DC
  Administered 2015-10-24 – 2015-10-25 (×2): 40 mg via ORAL
  Filled 2015-10-23 (×2): qty 1

## 2015-10-23 MED ORDER — IPRATROPIUM-ALBUTEROL 0.5-2.5 (3) MG/3ML IN SOLN
3.0000 mL | RESPIRATORY_TRACT | Status: DC
  Administered 2015-10-24: 3 mL via RESPIRATORY_TRACT
  Filled 2015-10-23: qty 3

## 2015-10-23 MED ORDER — ASPIRIN 81 MG PO CHEW
81.0000 mg | CHEWABLE_TABLET | Freq: Every day | ORAL | Status: DC
  Administered 2015-10-24 – 2015-10-25 (×3): 81 mg via ORAL
  Filled 2015-10-23 (×3): qty 1

## 2015-10-23 MED ORDER — ALBUTEROL (5 MG/ML) CONTINUOUS INHALATION SOLN
10.0000 mg/h | INHALATION_SOLUTION | RESPIRATORY_TRACT | Status: DC
  Administered 2015-10-23: 10 mg/h via RESPIRATORY_TRACT
  Filled 2015-10-23 (×3): qty 20

## 2015-10-23 MED ORDER — TAMSULOSIN HCL 0.4 MG PO CAPS
0.4000 mg | ORAL_CAPSULE | Freq: Every day | ORAL | Status: DC
  Administered 2015-10-24 – 2015-10-26 (×4): 0.4 mg via ORAL
  Filled 2015-10-23 (×4): qty 1

## 2015-10-23 MED ORDER — RAMIPRIL 10 MG PO CAPS
10.0000 mg | ORAL_CAPSULE | Freq: Two times a day (BID) | ORAL | Status: DC
Start: 2015-10-23 — End: 2015-10-25
  Administered 2015-10-24 – 2015-10-25 (×4): 10 mg via ORAL
  Filled 2015-10-23 (×5): qty 1

## 2015-10-23 MED ORDER — NITROGLYCERIN 0.4 MG SL SUBL: 0.4000 mg | SUBLINGUAL_TABLET | SUBLINGUAL | Status: DC | PRN

## 2015-10-23 MED ORDER — AZITHROMYCIN 250 MG PO TABS
250.0000 mg | ORAL_TABLET | Freq: Every day | ORAL | Status: DC
  Administered 2015-10-24: 250 mg via ORAL
  Filled 2015-10-23: qty 1

## 2015-10-23 MED ORDER — ADULT MULTIVITAMIN W/MINERALS CH
1.0000 | ORAL_TABLET | Freq: Every day | ORAL | Status: DC
  Administered 2015-10-24 – 2015-10-26 (×3): 1 via ORAL
  Filled 2015-10-23 (×3): qty 1

## 2015-10-23 MED ORDER — ONDANSETRON HCL 4 MG/2ML IJ SOLN: 4.0000 mg | Freq: Three times a day (TID) | INTRAMUSCULAR | Status: DC | PRN

## 2015-10-23 MED ORDER — VARENICLINE TARTRATE 1 MG PO TABS
1.0000 mg | ORAL_TABLET | Freq: Two times a day (BID) | ORAL | Status: DC
  Administered 2015-10-24 – 2015-10-26 (×5): 1 mg via ORAL
  Filled 2015-10-23 (×6): qty 1

## 2015-10-23 MED ORDER — INSULIN ASPART 100 UNIT/ML ~~LOC~~ SOLN: 0.0000 [IU] | Freq: Three times a day (TID) | SUBCUTANEOUS | Status: DC

## 2015-10-23 MED ORDER — FLUTICASONE PROPIONATE 50 MCG/ACT NA SUSP
2.0000 | Freq: Every day | NASAL | Status: DC
  Administered 2015-10-24 – 2015-10-26 (×3): 2 via NASAL
  Filled 2015-10-23: qty 16

## 2015-10-23 MED ORDER — AZITHROMYCIN 500 MG PO TABS
500.0000 mg | ORAL_TABLET | Freq: Every day | ORAL | Status: AC
  Administered 2015-10-24: 500 mg via ORAL
  Filled 2015-10-23: qty 1

## 2015-10-23 MED ORDER — IPRATROPIUM-ALBUTEROL 0.5-2.5 (3) MG/3ML IN SOLN
3.0000 mL | Freq: Once | RESPIRATORY_TRACT | Status: AC
  Administered 2015-10-23: 3 mL via RESPIRATORY_TRACT
  Filled 2015-10-23: qty 3

## 2015-10-23 MED ORDER — GABAPENTIN 100 MG PO CAPS
200.0000 mg | ORAL_CAPSULE | Freq: Three times a day (TID) | ORAL | Status: DC
  Administered 2015-10-24 – 2015-10-26 (×8): 200 mg via ORAL
  Filled 2015-10-23 (×8): qty 2

## 2015-10-23 MED ORDER — BUPROPION HCL ER (SR) 150 MG PO TB12
150.0000 mg | ORAL_TABLET | Freq: Two times a day (BID) | ORAL | Status: DC
  Administered 2015-10-24 – 2015-10-26 (×6): 150 mg via ORAL
  Filled 2015-10-23 (×6): qty 1

## 2015-10-23 MED ORDER — INSULIN DETEMIR 100 UNIT/ML ~~LOC~~ SOLN
60.0000 [IU] | Freq: Every day | SUBCUTANEOUS | Status: DC
  Administered 2015-10-24: 60 [IU] via SUBCUTANEOUS
  Filled 2015-10-23: qty 0.6

## 2015-10-23 MED ORDER — ACETAMINOPHEN 325 MG PO TABS: 650.0000 mg | ORAL_TABLET | Freq: Four times a day (QID) | ORAL | Status: DC | PRN

## 2015-10-23 MED ORDER — ISOSORBIDE MONONITRATE ER 30 MG PO TB24
30.0000 mg | ORAL_TABLET | Freq: Every day | ORAL | Status: DC
  Administered 2015-10-24 – 2015-10-26 (×3): 30 mg via ORAL
  Filled 2015-10-23 (×3): qty 1

## 2015-10-23 MED ORDER — FUROSEMIDE 20 MG PO TABS
20.0000 mg | ORAL_TABLET | Freq: Every day | ORAL | Status: DC
  Administered 2015-10-24 (×2): 20 mg via ORAL
  Filled 2015-10-23 (×2): qty 1

## 2015-10-23 MED ORDER — CHROMIUM PICOLINATE 1000 MCG PO TABS: 1000.0000 ug | ORAL_TABLET | Freq: Every day | ORAL | Status: DC

## 2015-10-23 MED ORDER — AMLODIPINE BESYLATE 5 MG PO TABS
5.0000 mg | ORAL_TABLET | Freq: Every day | ORAL | Status: DC
  Administered 2015-10-24 – 2015-10-26 (×4): 5 mg via ORAL
  Filled 2015-10-23 (×4): qty 1

## 2015-10-23 MED ORDER — METHYLPREDNISOLONE SODIUM SUCC 125 MG IJ SOLR
60.0000 mg | Freq: Three times a day (TID) | INTRAMUSCULAR | Status: DC
  Administered 2015-10-24 (×4): 60 mg via INTRAVENOUS
  Filled 2015-10-23 (×4): qty 2

## 2015-10-23 NOTE — ED Provider Notes (Signed)
[facility] DEPT Provider Note   CSN: [id number] Arrival date & time: 10/23/15  1700     History   Chief Complaint Chief Complaint  Patient presents with  . Shortness of Breath    HPI Joshua [name] is a 75 y.o. male.  Joshua [name] is a 75 y.o. male with history of D-CHF, HTN, HLD, COPD, CKD stage 1, PAF on xarelto, T2DM, arthritis, CAD presents to ED with complaint of shortness of breath. Patent reports increasing DOE x 1 day. He has associated wheezing, non-productive cough, lower extremity swelling b/l L >R. He has tried his home medications with minimal relief. Patient was seen in urgent care and noted O2 sats 88% on RA, he is not on home O2. He was given 124m solu-medrol and duoneb. EMS placed him on 2L [place] with improvement to 98% in O2 sats. Patient denies recent URI sxs, fever, chest pain, calf pain, abdominal pain, N/V, dysuria, hematuria, rash, LOC, dizziness, or lightheadedness, arthralgias, or myalgias. Of note, patient has erythematous area noted to left medial thigh - pt first noticed today. No recent long distance travel/surgery/hospitalization, hemoptysis, h/o cancer, h/o blood clots. He is on Xarelto. Endorses medication compliance. No dietary indiscretion.       Past Medical History:  Diagnosis Date  . Adenomatous colon polyp 10/16/2011   Repeat 2018  . Arthritis     Hips, R>L & KNEES  . CAD, multiple vessel    3V CAD cath 08/08/13----CABG done shortly after.  . 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 pylori neg, no dysplasia or malignancy.  . Chronic diastolic heart failure (HEmporium 2016  . Chronic renal insufficiency, stage II (mild)   . COPD (chronic obstructive pulmonary disease) (HGreen    GOLD II.  Spirometry  2004 borderline obstruction; 2015 mod obst  . Diabetic nephropathy (HCC)    Elevated urine microalb/cr 03/2011  . DM type 2 (diabetes mellitus, type 2) (HCC)    Poor control on max oral meds--pt eventually agreed to  insulin therap  . DOE (dyspnea on exertion)    COPD + chronic diastolic HF  . Erectile dysfunction    Normal testosterone  . Hearing loss of both ears 2016   Hearing aids  . Hyperlipidemia   . Hyperplastic colon polyp 2001  . Hypertension   . Iron deficiency anemia 2014   03/2012 capsule endoscopy showed 2 AVMs--likely responsible for his IDA--lifetime iron supp recommended + [id number].  . Macular degeneration, dry    Mild, bilat (Optometrist, Du[place]ford [place] [employer] [place] . Obesity   . Other and unspecified angina pectoris   . PAF (paroxysmal atrial fibrillation) (HCJennings Lodge10/2016   xarelto  . Pericardial effusion with cardiac tamponade 6//29/15   pericardiocentesis was done,  Infectious/inflamm (cytology showed NO MALIGNANT CELLS)  . Pneumonia   . Tobacco dependence in remission    100+ pack-yr hx: quit after CABG    Patient Active Problem List   Diagnosis Date Noted  . Chronic diastolic CHF (congestive heart failure) (HCFreeville02/13/2017  . Acute upper respiratory infection 02/06/2015  . Facial cellulitis 11/28/2014  . Abscess of nasal cavity 11/28/2014  . Cellulitis and abscess of neck 11/28/2014  . Acute on chronic diastolic heart failure (HCDakota City11/07/2014  . Dyspnea 10/29/2014  . Cough 10/29/2014  . History of asbestosis 10/29/2014  . Low oxygen saturation 10/29/2014  . Respiratory failure with hypoxia (HCAgawam10/24/2016  . COPD exacerbation (HCYazoo City10/24/2016  .  Acute on chronic respiratory failure with hypoxia ([place])   . Diabetes ([place]) 06/18/2014  . Depression 06/11/2014  . Diabetes mellitus with complication ([place]) 73/42/8768  . Postoperative anemia due to acute blood loss 10/12/2013  . 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 Coronary artery disease- 3 V at cath 08/08/13 08/08/2013  . DOE (dyspnea on exertion)   . Acute on chronic respiratory failure ([place]) 07/02/2013  . PAF (paroxysmal atrial fibrillation) ([place]) 07/02/2013  . Acute bronchitis 07/01/2013  .  Pleural effusion with elevated pro BNP 07/01/2013  . Pericardial effusion 07/01/2013  . Pericardial effusion with cardiac tamponade- s/p perciocentesis 06/30/13   . ACS (acute coronary syndrome) ([place]) 06/30/2013  . Fatigue 06/30/2013  . Nonspecific abnormal electrocardiogram (ECG) (EKG) 06/30/2013  . COPD with exacerbation ([place]) 06/30/2013  . Abnormal chest x-ray 10/03/2012  . 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 Arthritis   . COPD (chronic obstructive pulmonary disease) ([place])   . CKD (chronic kidney disease), stage I   . Obesity   . Degenerative arthritis of hip 08/12/2012  . Preoperative clearance 07/07/2012  . Hyperlipidemia 03/16/2012  . Tobacco dependence   . Health maintenance examination 09/16/2011  . HTN (hypertension), benign 09/02/2011  . Prostate cancer screening 09/02/2011    Past Surgical History:  Procedure Laterality Date  . APPENDECTOMY  1957  . CARDIAC CATHETERIZATION  08/08/2013  . CATARACT EXTRACTION W/ INTRAOCULAR LENS  IMPLANT, BILATERAL  04/08/2006 & 04/22/2006  . CATARACT EXTRACTION W/ INTRAOCULAR LENS  IMPLANT, BILATERAL Bilateral   . CHOLECYSTECTOMY OPEN  1999  . COLONOSCOPY  10/16/2011   Procedure: COLONOSCOPY;  Surgeon: Inda Castle, MD;  Location: [facility];  Service: Endoscopy;  Laterality: N/A;  . CORONARY ARTERY BYPASS GRAFT N/A 08/14/2013   Procedure: CORONARY ARTERY BYPASS GRAFTING (CABG);  Surgeon: Melrose Nakayama, MD;  Location: [facility];  Service: Open Heart Surgery;  Laterality: N/A;  Times 4   using left internal mammary artery and endoscopically harvested bilateral saphenous vein  . ESOPHAGOGASTRODUODENOSCOPY  02/25/12   Atrophic gastritis with a few erosions--capsule endoscopy planned as of 02/25/12 (Dr. Deatra Ina).  [name] HARDWARE REMOVAL Right 08/12/2012   Procedure: HARDWARE REMOVAL;  Surgeon: Mcarthur Rossetti, MD;  Location: [facility];  Service: Orthopedics;  Laterality: Right;  . HIP SURGERY Right 1954   Repair of slipped capital femoral  epiphysis.  . INTRAOPERATIVE TRANSESOPHAGEAL ECHOCARDIOGRAM N/A 08/14/2013   Normal LV function. Procedure: INTRAOPERATIVE TRANSESOPHAGEAL ECHOCARDIOGRAM;  Surgeon: Melrose Nakayama, MD;  Location: [facility];  Service: Open Heart Surgery;  Laterality: N/A;  . LEFT HEART CATHETERIZATION WITH CORONARY ANGIOGRAM N/A 08/08/2013   Procedure: LEFT HEART CATHETERIZATION WITH CORONARY ANGIOGRAM;  Surgeon: Jettie Booze, MD;  Location: [facility];  Service: Cardiovascular;  Laterality: N/A;  . PATELLA FRACTURE SURGERY Left ~ 1979   bolt + 3 screws to repair tib plateau fx  . PERICARDIAL TAP N/A 07/01/2013   Procedure: PERICARDIAL TAP;  Surgeon: Jettie Booze, MD;  Location[facility] C[facility] LAB;  Service: Cardiovascular;  Laterality: N/A;  . TESTICLE SURGERY  as a child   Undescended testicle brought down into scrotum  . TONSILLECTOMY  1947  . TOTAL HIP ARTHROPLASTY Right 08/12/2012   Procedure: REMOVAL OF OLD PINS RIGHT HIP AND RIGHT TOTAL HIP ARTHROPLASTY ANTERIOR APPROACH;  Surgeon: Mcarthur Rossetti, MD;  Location: [facility];  Service: Orthopedics;  Laterality: Right;  . TRANSTHORACIC ECHOCARDIOGRAM  10/30/14   Mod LVH, EF 60-65%, normal wall motion, mod mitral regurg, mild  [place]       Home Medications    Prior to Admission medications   Medication Sig Start Date End Date Taking? Authorizing Provider  acetaminophen (TYLENOL) 325 MG tablet Take 2 tablets (650 mg total) by mouth every 4 (four) hours as needed for headache or mild pain. 08/09/13  Yes Luke K Kilroy, PA-C  albuterol (PROVENTIL HFA;VENTOLIN HFA) 108 (90 Base) MCG/ACT inhaler Inhale 2 puffs into the lungs every 6 (six) hours as needed for wheezing or shortness of breath. 05/17/15  Yes Praveen Mannam, MD  amLODipine (NORVASC) 5 MG tablet Take 1 tablet (5 mg total) by mouth daily. 07/22/15  Yes Tammi Sou, MD  aspirin 81 MG tablet Take 81 mg by mouth at bedtime. Reported on 02/26/2015   Yes Historical Provider, MD    budesonide-formoterol (SYMBICORT) 160-4.5 MCG/ACT inhaler Inhale 2 puffs into the lungs 2 (two) times daily. 05/17/15  Yes Tammi Sou, MD  buPROPion [facility] SR) 150 MG 12 hr tablet Take 1 tablet by mouth two  times daily 06/10/15  Yes Tammi Sou, MD  CHANTIX CONTINUING MONTH PAK 1 MG tablet TAKE ONE TABLET BY MOUTH TWICE DAILY 10/14/15  Yes Tammi Sou, MD  chlorthalidone (HYGROTON) 25 MG tablet Take 25 mg by mouth daily. Reported on 02/26/2015 12/06/12  Yes Historical Provider, MD  Cholecalciferol (VITAMIN D-3) 1000 units CAPS Take 1,000 Units by mouth daily. Reported on 02/26/2015   Yes Historical Provider, MD  Chromium Picolinate 1000 MCG TABS Take 1,000 mcg by mouth daily. Reported on 02/26/2015   Yes Historical Provider, MD  citalopram (CELEXA) 40 MG tablet Take 1 tablet (40 mg total) by mouth daily. 07/23/14  Yes Tammi Sou, MD  diltiazem (CARDIZEM CD) 240 MG 24 hr capsule Take 1 capsule (240 mg total) by mouth daily. 11/27/14  Yes Isaiah Serge, NP  fluticasone (FLONASE) 50 MCG/ACT nasal spray Place 2 sprays into both nostrils daily. 05/17/15  Yes Tammi Sou, MD  furosemide (LASIX) 40 MG tablet Take 20 mg by mouth daily.  01/24/15  Yes Historical Provider, MD  gabapentin (NEURONTIN) 100 MG capsule 2 caps po tid Patient taking differently: Take 200 mg by mouth 3 (three) times daily. 2 caps po tid 05/17/15  Yes Tammi Sou, MD  guaiFENesin (MUCINEX) 600 MG 12 hr tablet Take 2 tablets (1,200 mg total) by mouth 2 (two) times daily. 11/02/14  Yes Verlee Monte, MD  Insulin Detemir (LEVEMIR FLEXTOUCH) 100 UNIT/ML Pen Inject 73 Units into the  skin at bedtime. Patient taking differently: Inject 75 Units into the skin daily at 10 pm.  09/27/15  Yes Tammi Sou, MD  insulin regular (NOVOLIN R,HUMULIN R) 100 units/mL injection Inject 16 units SQ with BF, 17 units SQ with Lunch, and 18 U SQ with Supper Patient taking differently: Inject 16-18 Units into the skin See admin  instructions. Inject 16 units SQ with BF, 17 units SQ with Lunch, and 18 U SQ with Supper 09/27/15  Yes Tammi Sou, MD  isosorbide mononitrate (IMDUR) 30 MG 24 hr tablet Take 1 tablet by mouth  daily 01/04/15  Yes Tammi Sou, MD  lovastatin (MEVACOR) 40 MG tablet Take 40 mg by mouth every morning.    Yes Historical Provider, MD  Multiple Vitamin (MULTIVITAMIN WITH MINERALS) TABS Take 1 tablet by mouth daily.   Yes Historical Provider, MD  nitroGLYCERIN (NITROSTAT) 0.4 MG SL tablet Place 1 tablet (0.4 mg total) under the tongue every 5 (five)  minutes as needed for chest pain. 01/29/15  Yes Jettie Booze, MD  pioglitazone (ACTOS) 45 MG tablet Take 45 mg by mouth daily. Reported on 02/26/2015 06/26/13  Yes Historical Provider, MD  ramipril (ALTACE) 10 MG capsule Take 10 mg by mouth 2 (two) times daily.  09/11/14  Yes Historical Provider, MD  rivaroxaban (XARELTO) 20 MG TABS tablet Take 1 tablet (20 mg total) by mouth daily with supper. 11/12/14  Yes Imogene Burn, PA-C  tamsulosin (FLOMAX) 0.4 MG CAPS capsule TAKE 1 CAPSULE BY MOUTH EVERY DAY AFTER SUPPER 07/22/15  Yes Tammi Sou, MD  azithromycin (ZITHROMAX) 250 MG tablet 2 tabs po qd x 1d, then 1 tab po qd x 4d Patient not taking: Reported on 10/23/2015 09/26/15   Tammi Sou, MD  glimepiride (AMARYL) 4 MG tablet TAKE TWO TABLETS BY MOUTH EVERY MORNING WITH BREAKFAST Patient not taking: Reported on 10/23/2015 08/12/15   Tammi Sou, MD  predniSONE (DELTASONE) 20 MG tablet 2 tabs po qd x 5d, then 1 tab po qd x 5d Patient not taking: Reported on 10/23/2015 09/26/15   Tammi Sou, MD  PROAIR HFA 108 [phone] Base) MCG/ACT inhaler INHALE 2 PUFFS INTO THE LUNGS EVERY 4 HOURS AS NEEDED FOR WHEEZING ORSHORTNESS OF BREATH 07/22/15   Tammi Sou, MD    Family History Family History  Problem Relation Age of Onset  . Heart disease Father   . Heart attack Father   . CVA Mother   . Hypertension Mother   . Diabetes Paternal  Grandmother   . Breast cancer Sister   . Diabetes Maternal Uncle   . Heart disease Maternal Uncle   . Stroke Neg Hx     Social History Social History  Substance Use Topics  . Smoking status: Current Every Day Smoker    Packs/day: 0.25    Years: 62.00    Types: Cigarettes    Last attempt to quit: 10/29/2014  . Smokeless tobacco: Former Systems developer    Quit date: 06/30/2013  . Alcohol use No     Allergies   Morphine and related; Demerol [meperidine]; and Starlix [nateglinide]   Review of Systems Review of Systems  Constitutional: Negative for fever.  HENT: Negative for trouble swallowing.   Eyes: Negative for visual disturbance.  Respiratory: Positive for cough, shortness of breath and wheezing.   Cardiovascular: Positive for leg swelling. Negative for chest pain and palpitations.  Gastrointestinal: Negative for abdominal pain, blood in stool, constipation, diarrhea, nausea and vomiting.  Genitourinary: Negative for dysuria and hematuria.  Musculoskeletal: Negative for arthralgias and myalgias.  Skin: Positive for color change and rash.  Neurological: Negative for dizziness, syncope and light-headedness.     Physical Exam Updated Vital Signs BP 174/76 (BP Location: Right Arm)   Pulse 94   Temp 98.8 F (37.1 C) (Oral)   Resp 22   Ht _0  (1.803 m)   Wt 114.3 kg   SpO2 93%   BMI 35.15 kg/m   Physical Exam  Constitutional: He appears well-developed and well-nourished. No distress.  HENT:  Head: Normocephalic and atraumatic.  Mouth/Throat: Oropharynx is clear and moist. No oropharyngeal exudate.  Eyes: Conjunctivae and EOM are normal. Pupils are equal, round, and reactive to light. Right eye exhibits no discharge. Left eye exhibits no discharge. No scleral icterus.  Neck: Normal range of motion and phonation normal. Neck supple. No neck rigidity. Normal range of motion present.  Cardiovascular: Normal rate, regular rhythm, normal heart sounds and  intact distal pulses.     No murmur heard. Pulmonary/Chest: Effort normal. No stridor. Tachypnea noted. No respiratory distress. He has wheezes. He has rhonchi. He has no rales.  Breath sounds heard b/l. Wheezing and rhonchi appreciated.   Abdominal: Soft. Bowel sounds are normal. He exhibits no distension. There is no tenderness. There is no rigidity, no rebound, no guarding and no CVA tenderness.  Musculoskeletal: Normal range of motion.  B/l 1-2+ pitting edema, L>R. No TTP of posterior calf. Negative homan's  Lymphadenopathy:    He has no cervical adenopathy.  Neurological: He is alert. He is not disoriented. Coordination and gait normal. GCS eye subscore is 4. GCS verbal subscore is 5. GCS motor subscore is 6.  Skin: Skin is warm and dry. He is not diaphoretic. There is erythema.     Psychiatric: He has a normal mood and affect. His behavior is normal.     ED Treatments / Results  Labs (all labs ordered are listed, but only abnormal results are displayed) Labs Reviewed  CBC WITH DIFFERENTIAL/PLATELET - Abnormal; Notable for the following:       Result Value   MCH 24.9 (*)    RDW 17.1 (*)    All other components within normal limits  BASIC METABOLIC PANEL - Abnormal; Notable for the following:    CO2 21 (*)    Glucose, Bld 212 (*)    All other components within normal limits  URINALYSIS, ROUTINE W REFLEX MICROSCOPIC (NOT AT [facility]) - Abnormal; Notable for the following:    Glucose, UA 250 (*)    All other components within normal limits  RESPIRATORY PANEL BY PCR  CULTURE, BLOOD (ROUTINE X 2)  CULTURE, BLOOD (ROUTINE X 2)  CULTURE, EXPECTORATED SPUTUM-ASSESSMENT  GRAM STAIN  BRAIN NATRIURETIC PEPTIDE  TROPONIN I  MAGNESIUM  INFLUENZA PANEL BY PCR (TYPE A & B, H1N1)  STREP PNEUMONIAE URINARY ANTIGEN    EKG  EKG Interpretation None       Radiology Dg Chest 2 View  Result Date: 10/23/2015 CLINICAL DATA:  Shortness of breath, dry cough and chest pain. History of COPD. History of smoking.  EXAM: CHEST  2 VIEW COMPARISON:  10/29/2014; 09/29/2013; 06/30/2013; chest CT - 06/24/2013 FINDINGS: Grossly unchanged cardiac silhouette and mediastinal contours post median sternotomy and CABG. Atherosclerotic plaque within the thoracic aorta. Re- demonstrated bilateral pleural calcifications, right greater than left. No new focal airspace opacities. No pleural effusion or pneumothorax. No evidence of edema. No acute osseus abnormalities. Stigmata of DISH within the thoracic spine. IMPRESSION: 1. Cardiomegaly without acute cardiopulmonary disease. 2. Re- demonstrated bilateral pleural calcifications compatible with prior asbestos exposure. 3.  Aortic Atherosclerosis (ICD10-170.0) Electronically Signed   By: Sandi Mariscal M.D.   On: 10/23/2015 18:39    Procedures Procedures (including critical care time)  Medications Ordered in ED Medications  ipratropium-albuterol (DUONEB) 0.5-2.5 (3) MG/3ML nebulizer solution 3 mL (not administered)  varenicline (CHANTIX) tablet 1 mg (not administered)  insulin detemir (LEVEMIR) injection 60 Units (not administered)  pravastatin (PRAVACHOL) tablet 40 mg (not administered)  tamsulosin (FLOMAX) capsule 0.4 mg (not administered)  amLODipine (NORVASC) tablet 5 mg (not administered)  buPROPion (WELLBUTRIN SR) 12 hr tablet 150 mg (not administered)  fluticasone (FLONASE) 50 MCG/ACT nasal spray 2 spray (not administered)  gabapentin (NEURONTIN) capsule 200 mg (not administered)  furosemide (LASIX) tablet 20 mg (not administered)  nitroGLYCERIN (NITROSTAT) SL tablet 0.4 mg (not administered)  chlorthalidone (HYGROTON) tablet 25 mg (not administered)  cholecalciferol (VITAMIN D) tablet 1,000  Units (not administered)  isosorbide mononitrate (IMDUR) 24 hr tablet 30 mg (not administered)  diltiazem (CARDIZEM CD) 24 hr capsule 240 mg (not administered)  ramipril (ALTACE) capsule 10 mg (not administered)  rivaroxaban (XARELTO) tablet 20 mg (not administered)  guaiFENesin  (MUCINEX) 12 hr tablet 1,200 mg (not administered)  citalopram (CELEXA) tablet 40 mg (not administered)  aspirin chewable tablet 81 mg (not administered)  acetaminophen (TYLENOL) tablet 650 mg (not administered)  multivitamin with minerals tablet 1 tablet (not administered)  albuterol (PROVENTIL) (2.5 MG/3ML) 0.083% nebulizer solution 2.5 mg (not administered)  methylPREDNISolone sodium succinate (SOLU-MEDROL) 125 mg/2 mL injection 60 mg (not administered)  azithromycin (ZITHROMAX) tablet 500 mg (not administered)    Followed by  azithromycin (ZITHROMAX) tablet 250 mg (not administered)  hydrALAZINE (APRESOLINE) injection 5 mg (not administered)  ondansetron (ZOFRAN) injection 4 mg (not administered)  zolpidem (AMBIEN) tablet 5 mg (not administered)  insulin aspart (novoLOG) injection 0-9 Units (not administered)  ipratropium-albuterol (DUONEB) 0.5-2.5 (3) MG/3ML nebulizer solution 3 mL (3 mLs Nebulization Given 10/23/15 1851)  ceFAZolin (ANCEF) IVPB 1 g/50 mL premix (1 g Intravenous New Bag/Given 10/23/15 2119)     Initial Impression / Assessment and Plan / ED Course  I have reviewed the triage vital signs and the nursing notes.  Pertinent labs & imaging results that were available during my care of the patient were reviewed by me and considered in my medical decision making (see chart for details).  Clinical Course  Comment By Time   EKG:  Normal sinus rhythm rate 90Prolonged PR intervalBorderline left axis deviationBorderline repolarization abnormalityNo acute ST-T wave changesNo prior EKG for comparison Dorie Rank, MD 10/18 1858  On re-evaluation patient endorses no improvement in breathing following breathing treatment. Wheezing slightly improved on auscultation. Will order cont neb. Roxanna Mew, [place] 10/18 2107    Patient presents to ED with worsening DOE x 1 day; patient seen at UC O2 sats 88%, given duoneb and solumedrol, EMS placed on 2L [place] with improvement to 98% O2  sats. Patient is afebrile and non-toxic appearing in NAD. Vital signs initially remarkable for hypoxia at 90% on RA and tachypneic. Patient placed on 2L [place] in ED with improvement in sats to >94%. B/l wheezing and rhonchi appreciated on lung exam. B/l lower 1-2+ pitting edema noted in lower extremities, L > R; posterior calf TTP. Area of erythema appreciated on left medial thigh. Duoneb given with minimal improvement in breathing. Continuous neb ordered.   Given increased swelling on L>R, DVT study ordered - negative for DVT, of incidental finding is increased left groin LN. Suspect erythematous area is cellulitic in nature, enlarged LN may reactive. Will start on ABX. CXR negative for acute cardiopulmonary process, no edema, consolidation, or PTX appreciated. EKG shows sinus rhythm with prolonged PR interval and borderline repol abnormality, no ST-T wave changes. Troponin negative. Low suspicion for ACS. BNP nml - low suspicion for CHF exacerbation. CBC re-assuring. Hyperglycemia without evidence of AG. U/A negative for infection. Suspect sxs may be secondary to COPD exacerbation. Given new hypoxia will consult hospitalist for admission for further management. Patient discussed and evaluated by Dr. Tomi Bamberger as well, agrees with plan.    Final Clinical Impressions(s) / ED Diagnoses   Final diagnoses:  COPD exacerbation ([place])  Hypoxia    New Prescriptions New Prescriptions   No medications on file     Roxanna Mew, PA-C 10/23/15 2234    Dorie Rank, MD 10/24/15 [phone]

## 2015-10-23 NOTE — ED Notes (Signed)
Spoke with Dr. Blaine Hamper, ok to continue neb tx until patient arrives to floor.

## 2015-10-23 NOTE — Progress Notes (Signed)
PHARMACIST - PHYSICIAN ORDER COMMUNICATION  CONCERNING: P&T Medication Policy on Herbal Medications  DESCRIPTION:  This patient's order for:  Chromium picolinate  has been noted.  This product(s) is classified as an "herbal" or natural product. Due to a lack of definitive safety studies or FDA approval, nonstandard manufacturing practices, plus the potential risk of unknown drug-drug interactions while on inpatient medications, the Pharmacy and Therapeutics Committee does not permit the use of "herbal" or natural products of this type within [facility].   ACTION TAKEN: The pharmacy department is unable to verify this order at this time and your patient has been informed of this safety policy. Please reevaluate patient's clinical condition at discharge and address if the herbal or natural product(s) should be resumed at that time.  Sherlon Handing, PharmD, BCPS Clinical pharmacist, pager [phone] 10/23/2015 10:04 PM

## 2015-10-23 NOTE — Progress Notes (Signed)
*  Preliminary Results* Left lower extremity venous duplex completed. Study was technically limited due to swelling. Visualized veins of the left lower extremity are negative for deep vein thrombosis. There is no evidence of left Baker's cyst.  Incidental finding: there is a 3.4cm heterogenous area of the left groin, suggestive of a possible enlarged inguinal lymph node.  10/23/2015 6:22 PM  Maudry Mayhew, BS, RVT, RDCS, RDMS

## 2015-10-23 NOTE — H&P (Signed)
History and Physical    Joshua [name] BOF:[id number] DOB: [birth year] DOA: 10/23/2015  Referring MD/NP/PA:   PCP: Tammi Sou, MD   Patient coming from:  The patient is coming from home.  At baseline, pt is independent for most of ADL.   Chief Complaint: SOB  HPI: Joshua [name] is a 75 y.o. male with medical history significant of COPD, hypertension, hyperlipidemia, diabetes mellitus, depression, pericardial effusion with cardiac temponade, PAF on Xarelto, obesity, bilateral hearing loss, CKD-II, CAD, S/P of CABG, arthritis, tobacco abuse, asbestos exposure, who presents with shortness of breath.  Patient states that he started having worsening shortness of breath today, which has been progressively getting worse. He has wheezing and dry cough, but no fever, chills or chest pain. No runny nose or sore throat. Patient was seen in urgent care and noted O2 sats 88% on RA, he is not on home O2. He was given 171m solu-medrol and duoneb. EMS placed him on 2L [place] with improvement to 98% in O2 sats. He can speak in full sentence. He has bilateral  lower extremity swelling (L >R). He has tried his home inhalers with minimal relief. Patient does not have nausea, vomiting, abdominal pain, diarrhea, symptoms of UTI or unilateral weakness.  ED Course: pt was found to have WBC 8.9, troponin negative, BNP 47.7, negative urinalysis, potassium normal, creatinine 1.07, temperature normal, heart rate 94, respiration rate 22, oxygen saturation 93% on 2 L nasal cannula oxygen. Chest x-ray is negative for infiltration, but showed bilateral pleural calcification compatible with prior asbestos exposure.  Pt is admitted to telemetry bed as inpatient.  # Left LE venous doppler: Study was technically limited due to swelling. Visualized veins of the left lower extremity are negative for deep vein thrombosis. There is no evidence of left Baker's cyst. Incidental finding: there is a 3.4cm heterogenous area of the left  groin, suggestive of a possible enlarged inguinal lymph node.  Review of Systems:   General: no fevers, chills, no changes in body weight, has poor appetite, has fatigue HEENT: no blurry vision, hearing changes or sore throat Respiratory: has dyspnea, coughing, wheezing CV: no chest pain, no palpitations GI: no nausea, vomiting, abdominal pain, diarrhea, constipation GU: no dysuria, burning on urination, increased urinary frequency, hematuria  Ext: has leg edema Neuro: no unilateral weakness, numbness, or tingling, no vision change or hearing loss Skin: no rash, no skin tear. MSK: No muscle spasm, no deformity, no limitation of range of movement in spin Heme: No easy bruising.  Travel history: No recent long distant travel.  Allergy:  Allergies  Allergen Reactions  . Morphine And Related Other (See Comments)    Drenched with perspiration  . Demerol [Meperidine] Nausea Only  . Starlix [Nateglinide] Other (See Comments)    gassy    Past Medical History:  Diagnosis Date  . Adenomatous colon polyp 10/16/2011   Repeat 2018  . Arthritis     Hips, R>L & KNEES  . CAD, multiple vessel    3V CAD cath 08/08/13----CABG done shortly after.  . 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 pylori neg, no dysplasia or malignancy.  . Chronic diastolic heart failure ([place]  . Chronic renal insufficiency, stage II (mild)   . COPD (chronic obstructive pulmonary disease) ([place]    GOLD II.  Spirometry  2004 borderline obstruction; 2015 mod obst  . Diabetic nephropathy (HCC)    Elevated urine microalb/cr 03/2011  . DM type 2 (diabetes mellitus, type  2) ([place])    Poor control on max oral meds--pt eventually agreed to insulin therap  . DOE (dyspnea on exertion)    COPD + chronic diastolic HF  . Erectile dysfunction    Normal testosterone  . Hearing loss of both ears 2016   Hearing aids  . Hyperlipidemia   . Hyperplastic colon polyp 2001  . Hypertension   .  Iron deficiency anemia 2014   03/2012 capsule endoscopy showed 2 AVMs--likely responsible for his IDA--lifetime iron supp recommended + q66moCBCs.  . Macular degeneration, dry    Mild, bilat (Optometrist, DMayford Knifeat [employer] [place]  . Obesity   . Other and unspecified angina pectoris   . PAF (paroxysmal atrial fibrillation) ([place] 10/2014   xarelto  . Pericardial effusion with cardiac tamponade 6//29/15   pericardiocentesis was done,  Infectious/inflamm (cytology showed NO MALIGNANT CELLS)  . Pneumonia   . Tobacco dependence in remission    100+ pack-yr hx: quit after CABG    Past Surgical History:  Procedure Laterality Date  . APPENDECTOMY  1957  . CARDIAC CATHETERIZATION  08/08/2013  . CATARACT EXTRACTION W/ INTRAOCULAR LENS  IMPLANT, BILATERAL  04/08/2006 & 04/22/2006  . CATARACT EXTRACTION W/ INTRAOCULAR LENS  IMPLANT, BILATERAL Bilateral   . CHOLECYSTECTOMY OPEN  1999  . COLONOSCOPY  10/16/2011   Procedure: COLONOSCOPY;  Surgeon: RInda Castle MD;  [facility];  Service: Endoscopy;  Laterality: N/A;  . CORONARY ARTERY BYPASS GRAFT N/A 08/14/2013   Procedure: CORONARY ARTERY BYPASS GRAFTING (CABG);  Surgeon: SMelrose Nakayama MD;  [facility]  Service: Open Heart Surgery;  Laterality: N/A;  Times 4   using left internal mammary artery and endoscopically harvested bilateral saphenous vein  . ESOPHAGOGASTRODUODENOSCOPY  02/25/12   Atrophic gastritis with a few erosions--capsule endoscopy planned as of 02/25/12 (Dr. KDeatra Ina.  .Marland KitchenHARDWARE REMOVAL Right 08/12/2012   Procedure: HARDWARE REMOVAL;  Surgeon: CMcarthur Rossetti MD;  [facility]: [facility];  Service: Orthopedics;  Laterality: Right;  . HIP SURGERY Right 1954   Repair of slipped capital femoral epiphysis.  . INTRAOPERATIVE TRANSESOPHAGEAL ECHOCARDIOGRAM N/A 08/14/2013   Normal LV function. Procedure: INTRAOPERATIVE TRANSESOPHAGEAL ECHOCARDIOGRAM;  Surgeon: SMelrose Nakayama MD;   [facility]  Service: Open Heart Surgery;  Laterality: N/A;  . LEFT HEART CATHETERIZATION WITH CORONARY ANGIOGRAM N/A 08/08/2013   Procedure: LEFT HEART CATHETERIZATION WITH CORONARY ANGIOGRAM;  Surgeon: JJettie Booze MD;  [facility];  Service: Cardiovascular;  Laterality: N/A;  . PATELLA FRACTURE SURGERY Left ~ 1979   bolt + 3 screws to repair tib plateau fx  . PERICARDIAL TAP N/A 07/01/2013   Procedure: PERICARDIAL TAP;  Surgeon: JJettie Booze MD;  [facility];  Service: Cardiovascular;  Laterality: N/A;  . TESTICLE SURGERY  as a child   Undescended testicle brought down into scrotum  . TONSILLECTOMY  1947  . TOTAL HIP ARTHROPLASTY Right 08/12/2012   Procedure: REMOVAL OF OLD PINS RIGHT HIP AND RIGHT TOTAL HIP ARTHROPLASTY ANTERIOR APPROACH;  Surgeon: CMcarthur Rossetti MD;  [facility];  Service: Orthopedics;  Laterality: Right;  . TRANSTHORACIC ECHOCARDIOGRAM  10/30/14   Mod LVH, EF 60-65%, normal wall motion, mod mitral regurg, mild PAH    Social History:  reports that he has been smoking Cigarettes.  He has a 15.50 pack-year smoking history. He quit smokeless tobacco use about 2 years ago. He reports that he does not drink alcohol or use  drugs.  Family History:  Family History  Problem Relation Age of Onset  . Heart disease Father   . Heart attack Father   . CVA Mother   . Hypertension Mother   . Diabetes Paternal Grandmother   . Breast cancer Sister   . Diabetes Maternal Uncle   . Heart disease Maternal Uncle   . Stroke Neg Hx      Prior to Admission medications   Medication Sig Start Date End Date Taking? Authorizing Provider  acetaminophen (TYLENOL) 325 MG tablet Take 2 tablets (650 mg total) by mouth every 4 (four) hours as needed for headache or mild pain. 08/09/13  Yes Luke K Kilroy, PA-C  albuterol (PROVENTIL HFA;VENTOLIN HFA) 108 (90 Base) MCG/ACT inhaler Inhale 2 puffs into the lungs every 6 (six) hours as needed for  wheezing or shortness of breath. 05/17/15  Yes Praveen Mannam, MD  amLODipine (NORVASC) 5 MG tablet Take 1 tablet (5 mg total) by mouth daily. 07/22/15  Yes Tammi Sou, MD  aspirin 81 MG tablet Take 81 mg by mouth at bedtime. Reported on 02/26/2015   Yes Historical Provider, MD  budesonide-formoterol (SYMBICORT) 160-4.5 MCG/ACT inhaler Inhale 2 puffs into the lungs 2 (two) times daily. 05/17/15  Yes Tammi Sou, MD  buPROPion [facility] SR) 150 MG 12 hr tablet Take 1 tablet by mouth two  times daily 06/10/15  Yes Tammi Sou, MD  CHANTIX CONTINUING MONTH PAK 1 MG tablet TAKE ONE TABLET BY MOUTH TWICE DAILY 10/14/15  Yes Tammi Sou, MD  chlorthalidone (HYGROTON) 25 MG tablet Take 25 mg by mouth daily. Reported on 02/26/2015 12/06/12  Yes Historical Provider, MD  Cholecalciferol (VITAMIN D-3) 1000 units CAPS Take 1,000 Units by mouth daily. Reported on 02/26/2015   Yes Historical Provider, MD  Chromium Picolinate 1000 MCG TABS Take 1,000 mcg by mouth daily. Reported on 02/26/2015   Yes Historical Provider, MD  citalopram (CELEXA) 40 MG tablet Take 1 tablet (40 mg total) by mouth daily. 07/23/14  Yes Tammi Sou, MD  diltiazem (CARDIZEM CD) 240 MG 24 hr capsule Take 1 capsule (240 mg total) by mouth daily. 11/27/14  Yes Isaiah Serge, NP  fluticasone (FLONASE) 50 MCG/ACT nasal spray Place 2 sprays into both nostrils daily. 05/17/15  Yes Tammi Sou, MD  furosemide (LASIX) 40 MG tablet Take 20 mg by mouth daily.  01/24/15  Yes Historical Provider, MD  gabapentin (NEURONTIN) 100 MG capsule 2 caps po tid Patient taking differently: Take 200 mg by mouth 3 (three) times daily. 2 caps po tid 05/17/15  Yes Tammi Sou, MD  guaiFENesin (MUCINEX) 600 MG 12 hr tablet Take 2 tablets (1,200 mg total) by mouth 2 (two) times daily. 11/02/14  Yes Verlee Monte, MD  Insulin Detemir (LEVEMIR FLEXTOUCH) 100 UNIT/ML Pen Inject 73 Units into the  skin at bedtime. Patient taking differently: Inject  75 Units into the skin daily at 10 pm.  09/27/15  Yes Tammi Sou, MD  insulin regular (NOVOLIN R,HUMULIN R) 100 units/mL injection Inject 16 units SQ with BF, 17 units SQ with Lunch, and 18 U SQ with Supper Patient taking differently: Inject 16-18 Units into the skin See admin instructions. Inject 16 units SQ with BF, 17 units SQ with Lunch, and 18 U SQ with Supper 09/27/15  Yes Tammi Sou, MD  isosorbide mononitrate (IMDUR) 30 MG 24 hr tablet Take 1 tablet by mouth  daily 01/04/15  Yes Tammi Sou, MD  lovastatin (MEVACOR) 40 MG tablet Take 40 mg by mouth every morning.    Yes Historical Provider, MD  Multiple Vitamin (MULTIVITAMIN WITH MINERALS) TABS Take 1 tablet by mouth daily.   Yes Historical Provider, MD  nitroGLYCERIN (NITROSTAT) 0.4 MG SL tablet Place 1 tablet (0.4 mg total) under the tongue every 5 (five) minutes as needed for chest pain. 01/29/15  Yes Jettie Booze, MD  pioglitazone (ACTOS) 45 MG tablet Take 45 mg by mouth daily. Reported on 02/26/2015 06/26/13  Yes Historical Provider, MD  ramipril (ALTACE) 10 MG capsule Take 10 mg by mouth 2 (two) times daily.  09/11/14  Yes Historical Provider, MD  rivaroxaban (XARELTO) 20 MG TABS tablet Take 1 tablet (20 mg total) by mouth daily with supper. 11/12/14  Yes Imogene Burn, PA-C  tamsulosin (FLOMAX) 0.4 MG CAPS capsule TAKE 1 CAPSULE BY MOUTH EVERY DAY AFTER SUPPER 07/22/15  Yes Tammi Sou, MD  azithromycin (ZITHROMAX) 250 MG tablet 2 tabs po qd x 1d, then 1 tab po qd x 4d Patient not taking: Reported on 10/23/2015 09/26/15   Tammi Sou, MD  glimepiride (AMARYL) 4 MG tablet TAKE TWO TABLETS BY MOUTH EVERY MORNING WITH BREAKFAST Patient not taking: Reported on 10/23/2015 08/12/15   Tammi Sou, MD  predniSONE (DELTASONE) 20 MG tablet 2 tabs po qd x 5d, then 1 tab po qd x 5d Patient not taking: Reported on 10/23/2015 09/26/15   Tammi Sou, MD  PROAIR HFA 108 [phone] Base) MCG/ACT inhaler INHALE 2 PUFFS INTO  THE LUNGS EVERY 4 HOURS AS NEEDED FOR WHEEZING ORSHORTNESS OF BREATH 07/22/15   Tammi Sou, MD    Physical Exam: Vitals:   10/23/15 2130 10/23/15 2145 10/23/15 2149 10/23/15 2200  BP: 181/89 165/67  165/75  Pulse: 97 93  98  Resp: _0 Temp:      TempSrc:      SpO2: 96% 96% 96% 96%  Weight:      Height:       General: Not in acute distress HEENT:       Eyes: PERRL, EOMI, no scleral icterus.       ENT: No discharge from the ears and nose, no pharynx injection, no tonsillar enlargement.        Neck: No JVD, no bruit, no mass felt. Heme: No neck lymph node enlargement. Cardiac: S1/S2, RRR, No murmurs, No gallops or rubs. Respiratory: has wheezing and rhonchi bilaterally. GI: Soft, nondistended, nontender, no rebound pain, no organomegaly, BS present. GU: No hematuria Ext: 1+ pitting leg edema bilaterally (L>R). 2+DP/PT pulse bilaterally. Musculoskeletal: No joint deformities, No joint redness or warmth, no limitation of ROM in spin. Skin: No rashes.  Neuro: Alert, oriented X3, cranial nerves II-XII grossly intact, moves all extremities normally.  Psych: Patient is not psychotic, no suicidal or hemocidal ideation.  Labs on Admission: I have personally reviewed following labs and imaging studies  CBC:  Recent Labs Lab 10/23/15 1710  WBC 8.9  NEUTROABS 5.6  HGB 13.7  HCT 44.0  MCV 80.0  PLT 739   Basic Metabolic Panel:  Recent Labs Lab 10/23/15 1710 10/23/15 1900  NA 138  --   K 4.7  --   CL 107  --   CO2 21*  --   GLUCOSE 212*  --   BUN 20  --   CREATININE 1.07  --   CALCIUM 8.9  --   MG  --  2.3  GFR: Estimated Creatinine Clearance: 75.5 mL/min (by C-G formula based on SCr of 1.07 mg/dL). Liver Function Tests: No results for input(s): AST, ALT, ALKPHOS, BILITOT, PROT, ALBUMIN in the last 168 hours. No results for input(s): LIPASE, AMYLASE in the last 168 hours. No results for input(s): AMMONIA in the last 168 hours. Coagulation Profile: No  results for input(s): INR, PROTIME in the last 168 hours. Cardiac Enzymes:  Recent Labs Lab 10/23/15 1831  TROPONINI <0.03   BNP (last 3 results) No results for input(s): PROBNP in the last 8760 hours. HbA1C: No results for input(s): HGBA1C in the last 72 hours. CBG: No results for input(s): GLUCAP in the last 168 hours. Lipid Profile: No results for input(s): CHOL, HDL, LDLCALC, TRIG, CHOLHDL, LDLDIRECT in the last 72 hours. Thyroid Function Tests: No results for input(s): TSH, T4TOTAL, FREET4, T3FREE, THYROIDAB in the last 72 hours. Anemia Panel: No results for input(s): VITAMINB12, FOLATE, FERRITIN, TIBC, IRON, RETICCTPCT in the last 72 hours. Urine analysis:    Component Value Date/Time   COLORURINE YELLOW 10/23/2015 [place] 10/23/2015 1850   LABSPEC 1.012 10/23/2015 1850   PHURINE 5.5 10/23/2015 1850   GLUCOSEU 250 (A) 10/23/2015 1850   HGBUR NEGATIVE 10/23/2015 1850   BILIRUBINUR NEGATIVE 10/23/2015 1850   KETONESUR NEGATIVE 10/23/2015 1850   PROTEINUR NEGATIVE 10/23/2015 1850   UROBILINOGEN 0.2 08/11/2013 1624   NITRITE NEGATIVE 10/23/2015 1850   LEUKOCYTESUR NEGATIVE 10/23/2015 1850   Sepsis Labs: _0 (procalcitonin:4,lacticidven:4) )No results found for this or any previous visit (from the past 240 hour(s)).   Radiological Exams on Admission: Dg Chest 2 View  Result Date: 10/23/2015 CLINICAL DATA:  Shortness of breath, dry cough and chest pain. History of COPD. History of smoking. EXAM: CHEST  2 VIEW COMPARISON:  10/29/2014; 09/29/2013; 06/30/2013; chest CT - 06/24/2013 FINDINGS: Grossly unchanged cardiac silhouette and mediastinal contours post median sternotomy and CABG. Atherosclerotic plaque within the thoracic aorta. Re- demonstrated bilateral pleural calcifications, right greater than left. No new focal airspace opacities. No pleural effusion or pneumothorax. No evidence of edema. No acute osseus abnormalities. Stigmata of DISH within  the thoracic spine. IMPRESSION: 1. Cardiomegaly without acute cardiopulmonary disease. 2. Re- demonstrated bilateral pleural calcifications compatible with prior asbestos exposure. 3.  Aortic Atherosclerosis (ICD10-170.0) Electronically Signed   By: Sandi Mariscal M.D.   On: 10/23/2015 18:39     EKG: Independently reviewed. Sinus rhythm, QTC 468, left axis deviation, anteroseptal infarction pattern.  Assessment/Plan Principal Problem:   Acute on chronic respiratory failure with hypoxia (HCC) Active Problems:   HTN (hypertension), benign   Tobacco dependence   Hyperlipidemia   PAF (paroxysmal atrial fibrillation) (HCC)   Coronary artery disease- 3 V at cath 08/08/13   S/P CABG x 4   Diabetes mellitus with complication (HCC)   Depression   COPD exacerbation (HCC)   Chronic diastolic CHF (congestive heart failure) ([place])   Acute on chronic respiratory failure with hypoxia due to COPD exacerbation: No infiltration on chest x-ray. No chest pain and LE venous doppler negative for DVT-->less likely to have PE.  -will admit patient to telemetry bed as inpt -Nebulizers: scheduled Duoneb and prn albuterol -Solu-Medrol 60 mg IV tid -Oral azithromycin for 5 days.  -Mucinex for cough  -Urine S. pneumococcal antigen -Follow up blood culture x2, sputum culture, respiratory virus panel, Flu pcr  HTN:  -continue amlodipine, chlorothiazide, Cardizem, Lasix, ramipril -prn hydralazine  Tobacco abuse: -Did counseling about importance of quitting smoking -Chantix  HLD: Last LDL  was 63 on 07/04/13 -Pravastatin  Atrial Fibrillation: CHA2DS2-VASc Score is 7, needs oral anticoagulation. Patient is on Xarelto at home. Heart rate is well controlled. -continue Cardizem and Xarelto  CAD: s/p of CABG. No CP -continue ASA, statin, Imdur -prn NTG  DM-II: Last A1c 9.2 on 08/21/15, poorly controled. Patient is taking Levemir, Novolin, Actos, Amaryl at home -will decrease Levemir dose from 73 to 60 units  daily -SSI  Depression and anxiety: Stable, no suicidal or homicidal ideations. -Continue home medications: Wellbutrin, Celexa,  Chronic diastolic CHF (congestive heart failure) ([place]): 2-D echo on 10/30/14 showed EF 60-65%. BNP 47.7, has mild leg edema. CHF seems to be compensated. -Continue aspirin and Lasix  Incidental finding: there is a 3.4cm heterogenous area of the left groin, suggestive of a possible enlarged inguinal lymph node. -outpt f/u with PCP    DVT ppx: on Xarelto Code Status: Full code Family Communication: Yes, patient's wife at bed side Disposition Plan:  Anticipate discharge back to previous home environment Consults called:  none Admission status:  Inpatient/tele  Date of Service 10/23/2015    Ivor Costa [facility] Pager [phone]  If 7PM-7AM, please contact night-coverage [email or web address] Password [email or web address] 10/23/2015, 10:10 PM

## 2015-10-23 NOTE — ED Notes (Signed)
Vascular Tech paged.

## 2015-10-23 NOTE — ED Notes (Signed)
Paged Dr. Blaine Hamper to Jenny Reichmann, [place]

## 2015-10-23 NOTE — ED Triage Notes (Signed)
Per EMS, After lunch, started feeling sob, tried to see PCP, couldn't get apointment, went to urgent care where they report he had bilateral wheezing, O2 sats 88% on Ra.Urgent care gave Duoneb and 125 mg Solu-Medrol. EMS put patient on 2L Nasal Cannula., O2 sats-98%. 174/74, NSR 80. 20 G in Left AC.

## 2015-10-24 DIAGNOSIS — I2510 Atherosclerotic heart disease of native coronary artery without angina pectoris: Secondary | ICD-10-CM | POA: Diagnosis not present

## 2015-10-24 DIAGNOSIS — E1121 Type 2 diabetes mellitus with diabetic nephropathy: Secondary | ICD-10-CM | POA: Diagnosis not present

## 2015-10-24 DIAGNOSIS — E118 Type 2 diabetes mellitus with unspecified complications: Secondary | ICD-10-CM

## 2015-10-24 DIAGNOSIS — M1990 Unspecified osteoarthritis, unspecified site: Secondary | ICD-10-CM | POA: Diagnosis not present

## 2015-10-24 DIAGNOSIS — J9621 Acute and chronic respiratory failure with hypoxia: Secondary | ICD-10-CM

## 2015-10-24 DIAGNOSIS — I10 Essential (primary) hypertension: Secondary | ICD-10-CM

## 2015-10-24 DIAGNOSIS — I130 Hypertensive heart and chronic kidney disease with heart failure and stage 1 through stage 4 chronic kidney disease, or unspecified chronic kidney disease: Secondary | ICD-10-CM | POA: Diagnosis not present

## 2015-10-24 DIAGNOSIS — Z951 Presence of aortocoronary bypass graft: Secondary | ICD-10-CM | POA: Diagnosis not present

## 2015-10-24 DIAGNOSIS — Z7901 Long term (current) use of anticoagulants: Secondary | ICD-10-CM | POA: Diagnosis not present

## 2015-10-24 DIAGNOSIS — I5032 Chronic diastolic (congestive) heart failure: Secondary | ICD-10-CM | POA: Diagnosis not present

## 2015-10-24 DIAGNOSIS — J441 Chronic obstructive pulmonary disease with (acute) exacerbation: Principal | ICD-10-CM

## 2015-10-24 DIAGNOSIS — R0902 Hypoxemia: Secondary | ICD-10-CM

## 2015-10-24 DIAGNOSIS — F17200 Nicotine dependence, unspecified, uncomplicated: Secondary | ICD-10-CM

## 2015-10-24 DIAGNOSIS — Z77090 Contact with and (suspected) exposure to asbestos: Secondary | ICD-10-CM | POA: Diagnosis not present

## 2015-10-24 DIAGNOSIS — N179 Acute kidney failure, unspecified: Secondary | ICD-10-CM | POA: Diagnosis not present

## 2015-10-24 DIAGNOSIS — E785 Hyperlipidemia, unspecified: Secondary | ICD-10-CM | POA: Diagnosis not present

## 2015-10-24 DIAGNOSIS — N182 Chronic kidney disease, stage 2 (mild): Secondary | ICD-10-CM | POA: Diagnosis not present

## 2015-10-24 DIAGNOSIS — I480 Paroxysmal atrial fibrillation: Secondary | ICD-10-CM

## 2015-10-24 DIAGNOSIS — F329 Major depressive disorder, single episode, unspecified: Secondary | ICD-10-CM | POA: Diagnosis not present

## 2015-10-24 DIAGNOSIS — L03314 Cellulitis of groin: Secondary | ICD-10-CM | POA: Diagnosis not present

## 2015-10-24 LAB — GLUCOSE, CAPILLARY
GLUCOSE-CAPILLARY: 476 mg/dL — AB (ref 65–99)
GLUCOSE-CAPILLARY: 598 mg/dL — AB (ref 65–99)
Glucose-Capillary: >600 mg/dL (ref 65–99)
Glucose-Capillary: 409 mg/dL — ABNORMAL HIGH (ref 65–99)
Glucose-Capillary: 427 mg/dL — ABNORMAL HIGH (ref 65–99)
Glucose-Capillary: 453 mg/dL — ABNORMAL HIGH (ref 65–99)

## 2015-10-24 LAB — RESPIRATORY PANEL BY PCR
ADENOVIRUS-RVPPCR: NOT DETECTED
Bordetella pertussis: NOT DETECTED
CORONAVIRUS 229E-RVPPCR: NOT DETECTED
CORONAVIRUS OC43-RVPPCR: NOT DETECTED
Chlamydophila pneumoniae: NOT DETECTED
Coronavirus HKU1: NOT DETECTED
Coronavirus NL63: NOT DETECTED
INFLUENZA B-RVPPCR: NOT DETECTED
Influenza A: NOT DETECTED
MYCOPLASMA PNEUMONIAE-RVPPCR: NOT DETECTED
Metapneumovirus: NOT DETECTED
PARAINFLUENZA VIRUS 1-RVPPCR: NOT DETECTED
Parainfluenza Virus 2: NOT DETECTED
Parainfluenza Virus 3: NOT DETECTED
Parainfluenza Virus 4: NOT DETECTED
RESPIRATORY SYNCYTIAL VIRUS-RVPPCR: NOT DETECTED
Rhinovirus / Enterovirus: NOT DETECTED

## 2015-10-24 LAB — EXPECTORATED SPUTUM ASSESSMENT W REFEX TO RESP CULTURE

## 2015-10-24 LAB — BASIC METABOLIC PANEL
ANION GAP: 11 (ref 5–15)
BUN: 25 mg/dL — ABNORMAL HIGH (ref 6–20)
CALCIUM: 8.6 mg/dL — AB (ref 8.9–10.3)
CHLORIDE: 101 mmol/L (ref 101–111)
CO2: 20 mmol/L — AB (ref 22–32)
Creatinine, Ser: 1.11 mg/dL (ref 0.61–1.24)
GFR calc Af Amer: >60 mL/min (ref >60)
GFR calc non Af Amer: >60 mL/min (ref >60)
GLUCOSE: 653 mg/dL — AB (ref 65–99)
POTASSIUM: 4.5 mmol/L (ref 3.5–5.1)
Sodium: 132 mmol/L — ABNORMAL LOW (ref 135–145)

## 2015-10-24 LAB — INFLUENZA PANEL BY PCR (TYPE A & B)
H1N1 flu by pcr: NOT DETECTED
INFLAPCR: NEGATIVE
INFLBPCR: NEGATIVE

## 2015-10-24 LAB — EXPECTORATED SPUTUM ASSESSMENT W GRAM STAIN, RFLX TO RESP C

## 2015-10-24 LAB — STREP PNEUMONIAE URINARY ANTIGEN: Strep Pneumo Urinary Antigen: NEGATIVE

## 2015-10-24 MED ORDER — DOXYCYCLINE HYCLATE 100 MG PO TABS
100.0000 mg | ORAL_TABLET | Freq: Two times a day (BID) | ORAL | Status: DC
  Administered 2015-10-24 – 2015-10-26 (×5): 100 mg via ORAL
  Filled 2015-10-24 (×5): qty 1

## 2015-10-24 MED ORDER — IPRATROPIUM-ALBUTEROL 0.5-2.5 (3) MG/3ML IN SOLN
3.0000 mL | Freq: Three times a day (TID) | RESPIRATORY_TRACT | Status: DC
  Administered 2015-10-24 – 2015-10-26 (×7): 3 mL via RESPIRATORY_TRACT
  Filled 2015-10-24 (×7): qty 3

## 2015-10-24 MED ORDER — INSULIN ASPART 100 UNIT/ML ~~LOC~~ SOLN
25.0000 [IU] | Freq: Once | SUBCUTANEOUS | Status: AC
  Administered 2015-10-24: 25 [IU] via SUBCUTANEOUS

## 2015-10-24 MED ORDER — INSULIN ASPART 100 UNIT/ML ~~LOC~~ SOLN
0.0000 [IU] | Freq: Three times a day (TID) | SUBCUTANEOUS | Status: DC
  Administered 2015-10-24 (×2): 20 [IU] via SUBCUTANEOUS

## 2015-10-24 MED ORDER — INSULIN DETEMIR 100 UNIT/ML ~~LOC~~ SOLN
73.0000 [IU] | Freq: Every day | SUBCUTANEOUS | Status: DC
  Filled 2015-10-24: qty 0.73

## 2015-10-24 MED ORDER — INSULIN DETEMIR 100 UNIT/ML ~~LOC~~ SOLN
80.0000 [IU] | Freq: Every day | SUBCUTANEOUS | Status: DC
  Administered 2015-10-24 – 2015-10-25 (×2): 80 [IU] via SUBCUTANEOUS
  Filled 2015-10-24 (×4): qty 0.8

## 2015-10-24 MED ORDER — INSULIN ASPART 100 UNIT/ML ~~LOC~~ SOLN: 0.0000 [IU] | Freq: Every day | SUBCUTANEOUS | Status: DC

## 2015-10-24 MED ORDER — INSULIN ASPART 100 UNIT/ML ~~LOC~~ SOLN
20.0000 [IU] | Freq: Once | SUBCUTANEOUS | Status: AC
  Administered 2015-10-24: 20 [IU] via SUBCUTANEOUS

## 2015-10-24 MED ORDER — INSULIN ASPART 100 UNIT/ML ~~LOC~~ SOLN
10.0000 [IU] | Freq: Once | SUBCUTANEOUS | Status: AC
  Administered 2015-10-24: 10 [IU] via SUBCUTANEOUS

## 2015-10-24 MED ORDER — INSULIN ASPART 100 UNIT/ML ~~LOC~~ SOLN
15.0000 [IU] | Freq: Three times a day (TID) | SUBCUTANEOUS | Status: DC
  Administered 2015-10-24 – 2015-10-26 (×6): 15 [IU] via SUBCUTANEOUS

## 2015-10-24 MED ORDER — INSULIN ASPART 100 UNIT/ML ~~LOC~~ SOLN
0.0000 [IU] | SUBCUTANEOUS | Status: DC
  Administered 2015-10-25 (×2): 11 [IU] via SUBCUTANEOUS
  Administered 2015-10-25 (×2): 15 [IU] via SUBCUTANEOUS
  Administered 2015-10-25: 20 [IU] via SUBCUTANEOUS
  Administered 2015-10-26: 11 [IU] via SUBCUTANEOUS
  Administered 2015-10-26 (×3): 15 [IU] via SUBCUTANEOUS

## 2015-10-24 NOTE — Procedures (Signed)
Pt. With elevated blood glucose during the night. On call NP made aware. Orders received and implemented. Pt. In no distress or discomfort. RN will continue to monitor pt. For changes in condition. Sayf Kerner, Katherine Roan

## 2015-10-24 NOTE — Consult Note (Signed)
[facility] Inpatient Consult   10/24/2015  Joshua [name] 03-17-1940 [id number]    Patient is currently active with [facility] for chronic disease management services.  Patient has been engaged by [facility] CSW. Chart review reveals Pt admitted with acute on chronic respiratory failure. PMH: COPD, HTN, DM, HLD, depression, pericardial effusion, PAF, obesity, CKD, CAD, CABG, tobacco use.   Our community based plan of care has focused on disease management and community resource support.  Patient will receive a post discharge transition of care call and will be evaluated for monthly home visits for assessments and disease process education.  Made Inpatient Case Manager aware that [facility] following. Of note, [facility] Care Management services does not replace or interfere with any services that are needed or arranged by inpatient case management or social work.  For additional questions or referrals please contact:  Natividad Brood, RN BSN [facility] Liaison  [phone] business mobile phone Toll free office [phone]

## 2015-10-24 NOTE — Evaluation (Signed)
Occupational Therapy Evaluation Patient Details Name: Joshua [name] MRN: [id number] DOB: 09-27-1940 Today's Date: 10/24/2015    History of Present Illness Pt admitted with acute on chronic respiratory failure. PMH: COPD, HTN, DM, HLD, depression, pericardial effusion, PAF, obesity, CKD, CAD, CABG, tobacco use.   Clinical Impression   Pt is modified independent in self care and ADL transfers, his baseline. No OT needs. All DME needs are met.    Follow Up Recommendations  No OT follow up    Equipment Recommendations  None recommended by OT    Recommendations for Other Services       Precautions / Restrictions        Mobility Bed Mobility Overal bed mobility: Independent                Transfers Overall transfer level: Modified independent                    Balance                                            ADL Overall ADL's : At baseline                                             Vision     Perception     Praxis      Pertinent Vitals/Pain Pain Assessment: No/denies pain     Hand Dominance Right   Extremity/Trunk Assessment Upper Extremity Assessment Upper Extremity Assessment: Overall WFL for tasks assessed   Lower Extremity Assessment Lower Extremity Assessment: Defer to PT evaluation       Communication Communication Communication: HOH   Cognition Arousal/Alertness: Awake/alert Behavior During Therapy: WFL for tasks assessed/performed Overall Cognitive Status: Within Functional Limits for tasks assessed                     General Comments       Exercises       Shoulder Instructions      Home Living Family/patient expects to be discharged to:: Private residence Living Arrangements: Spouse/significant other Available Help at Discharge: Family;Available 24 hours/day Type of Home: House Home Access: Ramped entrance     Home Layout: One level     Bathroom  Shower/Tub: Occupational psychologist: Handicapped height     Home Equipment: Environmental consultant - 4 wheels;Cane - single point;Shower seat;Grab bars - toilet;Grab bars - tub/shower;Hand held shower head;Adaptive equipment Adaptive Equipment: Sock aid        Prior Functioning/Environment Level of Independence: Independent with assistive device(s)        Comments: drives, uses 4WW outside, cane inside, sits to shower, sock aid for L foot        OT Problem List:     OT Treatment/Interventions:      OT Goals(Current goals can be found in the care plan section) Acute Rehab OT Goals Patient Stated Goal: to go home tomorrow  OT Frequency:     Barriers to D/C:            Co-evaluation              End of Session Equipment Utilized During Treatment: Gait belt Nurse Communication:  (ok to give diet ginger  ale)  Activity Tolerance: Patient tolerated treatment well Patient left: in chair;with call bell/phone within reach   Time: 0016-4290 OT Time Calculation (min): 16 min Charges:  OT General Charges $OT Visit: 1 Procedure OT Evaluation $OT Eval Low Complexity: 1 Procedure G-Codes:    Malka So 10/24/2015, 9:56 AM  [phone]

## 2015-10-24 NOTE — Progress Notes (Signed)
PROGRESS NOTE    Joshua [name]  AVW:[id number] DOB: 1940-07-24 DOA: 10/23/2015 PCP: Tammi Sou, MD   Chief Complaint  Patient presents with  . Shortness of Breath    Brief Narrative:  HPI on 10/23/2015 by Dr. Ivor Costa Joshua [name] is a 75 y.o. male with medical history significant of COPD, hypertension, hyperlipidemia, diabetes mellitus, depression, pericardial effusion with cardiac temponade, PAF on Xarelto, obesity, bilateral hearing loss, CKD-II, CAD, S/P of CABG, arthritis, tobacco abuse, asbestos exposure, who presents with shortness of breath.  Patient states that he started having worsening shortness of breath today, which has been progressively getting worse. He has wheezing and dry cough, but no fever, chills or chest pain. No runny nose or sore throat. Patient was seen in urgent care and noted O2 sats 88% on RA, he is not on home O2. He was given 145m solu-medrol and duoneb. EMS placed him on 2L [place] with improvement to 98% in O2 sats. He can speak in full sentence. He has bilateral  lower extremity swelling (L >R). He has tried his home inhalers with minimal relief. Patient does not have nausea, vomiting, abdominal pain, diarrhea, symptoms of UTI or unilateral weakness. Assessment & Plan   Acute respiratory failure with hypoxia/COPD exacerbation -CXR: Unremarkable for infection  -Lower extremity Doppler negative for DVT -Unlikely be PE as patient is currently on Xarelto -Respiratory viral panel and influenza PCR pending -Urine strep pneumococcal antigen pending -Initially placed on azithromycin however will switch to doxycycline -Continue Solu-Medrol however will begin to wean -Continue nebulizer treatments as needed  Diabetes mellitus, type II with hyperglycemia -Will restart patient's home dose of Levemir -Will place on resistant sliding scale -Patient is currently on IV Solu-Medrol -Continue CBG monitoring  Chronic diastolic heart failure -Echocardiogram  October 2016 showed an EF of 60-65% -Currently appears euvolemic and compensated -Continue Lasix -Monitor intake and output, daily weights  Essential hypertension -Continue amlodipine, or thiazide, Cardizem, Lasix, ramipril  Incidental finding/cellulitis -3.4 similar heterogeneous area of the left groin, suggestive of possible enlarged inguinal lymph node -Will have area marked. Per patient, area is where he normally injects his insulin. Does appear the redness is improving -Will place patient on doxycycline -Patient will need follow-up with his primary care physician  Depression/anxiety -Continue Wellbutrin, Celexa  Coronary artery disease -Status post CABG. -Currently chest pain-free -Continue aspirin, statin, Imdur  Atrial fibrillation -CHADSVASC 7 -Continue Xarelto  Tobacco abuse -Continue Chantix -Discussed smoking cessation  Hyperlipidemia -Continue statin  DVT Prophylaxis  Xarelto  Code Status: Full  Family Communication: None at bedside  Disposition Plan: Admitted. Possible discharge within 24-48 hours, pending improvement.  Consultants None  Procedures  Lower extremity doppler  Antibiotics   Anti-infectives    Start     Dose/Rate Route Frequency Ordered Stop   10/24/15 1400  doxycycline (VIBRA-TABS) tablet 100 mg     100 mg Oral Every 12 hours 10/24/15 1348     10/24/15 1000  azithromycin (ZITHROMAX) tablet 250 mg  Status:  Discontinued     250 mg Oral Daily 10/23/15 2156 10/24/15 1348   10/23/15 2200  azithromycin (ZITHROMAX) tablet 500 mg     500 mg Oral Daily 10/23/15 2156 10/24/15 0020   10/23/15 2115  ceFAZolin (ANCEF) IVPB 1 g/50 mL premix     1 g 100 mL/hr over 30 Minutes Intravenous  Once 10/23/15 2107 10/24/15 0759      Subjective:   JRia Bushseen and examined today.  Patient states breathing has  improved. It seems that he choked on some popcorn yesterday, and began to have a coughing spell. Patient denies any current chest pain,  abdominal pain, nausea or vomiting, diarrhea or constipation, dizziness or headache.  Objective:   Vitals:   10/24/15 0118 10/24/15 0415 10/24/15 0830 10/24/15 1135  BP:  (!) 140/58 (!) 147/66 (!) 138/54  Pulse:  94 92 85  Resp:  _0 Temp:  98.1 F (36.7 C) 98.6 F (37 C) 97.7 F (36.5 C)  TempSrc:  Oral Oral Oral  SpO2: 95% 94% 94% 94%  Weight:  111.9 kg (246 lb 12.8 oz)    Height:        Intake/Output Summary (Last 24 hours) at 10/24/15 1349 Last data filed at 10/24/15 1102  Gross per 24 hour  Intake             1015 ml  Output                0 ml  Net             1015 ml   Filed Weights   10/23/15 1710 10/23/15 2259 10/24/15 0415  Weight: 114.3 kg (252 lb) 112.9 kg (248 lb 14.4 oz) 111.9 kg (246 lb 12.8 oz)    Exam  General: Well developed, well nourished, NAD, appears stated age  HEENT: NCAT,  mucous membranes moist.   Cardiovascular: S1 S2 auscultated, RRR  Respiratory: Diminished breath sounds, mild expiratory wheezing  Abdomen: Soft, nontender, nondistended, + bowel sounds  Extremities: warm dry without cyanosis clubbing. LLE edema  Neuro: AAOx3, Nonfocal  Skin: Small area of bruising and erythema noted medial left thigh.  Psych: Normal affect and demeanor with intact judgement and insight   Data Reviewed: I have personally reviewed following labs and imaging studies  CBC:  Recent Labs Lab 10/23/15 1710  WBC 8.9  NEUTROABS 5.6  HGB 13.7  HCT 44.0  MCV 80.0  PLT 339   Basic Metabolic Panel:  Recent Labs Lab 10/23/15 1710 10/23/15 1900 10/24/15 0214  NA 138  --  132*  K 4.7  --  4.5  CL 107  --  101  CO2 21*  --  20*  GLUCOSE 212*  --  653*  BUN 20  --  25*  CREATININE 1.07  --  1.11  CALCIUM 8.9  --  8.6*  MG  --  2.3  --    GFR: Estimated Creatinine Clearance: 72 mL/min (by C-G formula based on SCr of 1.11 mg/dL). Liver Function Tests: No results for input(s): AST, ALT, ALKPHOS, BILITOT, PROT, ALBUMIN in the last 168  hours. No results for input(s): LIPASE, AMYLASE in the last 168 hours. No results for input(s): AMMONIA in the last 168 hours. Coagulation Profile: No results for input(s): INR, PROTIME in the last 168 hours. Cardiac Enzymes:  Recent Labs Lab 10/23/15 1831  TROPONINI <0.03   BNP (last 3 results) No results for input(s): PROBNP in the last 8760 hours. HbA1C: No results for input(s): HGBA1C in the last 72 hours. CBG:  Recent Labs Lab 10/24/15 0025 10/24/15 0205 10/24/15 0632 10/24/15 1133  GLUCAP 598* >600* 409* 427*   Lipid Profile: No results for input(s): CHOL, HDL, LDLCALC, TRIG, CHOLHDL, LDLDIRECT in the last 72 hours. Thyroid Function Tests: No results for input(s): TSH, T4TOTAL, FREET4, T3FREE, THYROIDAB in the last 72 hours. Anemia Panel: No results for input(s): VITAMINB12, FOLATE, FERRITIN, TIBC, IRON, RETICCTPCT in the last 72 hours. Urine analysis:  Component Value Date/Time   COLORURINE YELLOW 10/23/2015 1850   APPEARANCEUR CLEAR 10/23/2015 1850   LABSPEC 1.012 10/23/2015 1850   PHURINE 5.5 10/23/2015 1850   GLUCOSEU 250 (A) 10/23/2015 1850   HGBUR NEGATIVE 10/23/2015 1850   BILIRUBINUR NEGATIVE 10/23/2015 1850   KETONESUR NEGATIVE 10/23/2015 1850   PROTEINUR NEGATIVE 10/23/2015 1850   UROBILINOGEN 0.2 08/11/2013 1624   NITRITE NEGATIVE 10/23/2015 1850   LEUKOCYTESUR NEGATIVE 10/23/2015 1850   Sepsis Labs: _0 (procalcitonin:4,lacticidven:4)  ) Recent Results (from the past 240 hour(s))  Respiratory Panel by PCR     Status: None   Collection Time: 10/23/15 11:24 PM  Result Value Ref Range Status   Adenovirus NOT DETECTED NOT DETECTED Final   Coronavirus 229E NOT DETECTED NOT DETECTED Final   Coronavirus HKU1 NOT DETECTED NOT DETECTED Final   Coronavirus NL63 NOT DETECTED NOT DETECTED Final   Coronavirus OC43 NOT DETECTED NOT DETECTED Final   Metapneumovirus NOT DETECTED NOT DETECTED Final   Rhinovirus / Enterovirus NOT DETECTED NOT  DETECTED Final   Influenza A NOT DETECTED NOT DETECTED Final   Influenza B NOT DETECTED NOT DETECTED Final   Parainfluenza Virus 1 NOT DETECTED NOT DETECTED Final   Parainfluenza Virus 2 NOT DETECTED NOT DETECTED Final   Parainfluenza Virus 3 NOT DETECTED NOT DETECTED Final   Parainfluenza Virus 4 NOT DETECTED NOT DETECTED Final   Respiratory Syncytial Virus NOT DETECTED NOT DETECTED Final   Bordetella pertussis NOT DETECTED NOT DETECTED Final   Chlamydophila pneumoniae NOT DETECTED NOT DETECTED Final   Mycoplasma pneumoniae NOT DETECTED NOT DETECTED Final      Radiology Studies: Dg Chest 2 View  Result Date: 10/23/2015 CLINICAL DATA:  Shortness of breath, dry cough and chest pain. History of COPD. History of smoking. EXAM: CHEST  2 VIEW COMPARISON:  10/29/2014; 09/29/2013; 06/30/2013; chest CT - 06/24/2013 FINDINGS: Grossly unchanged cardiac silhouette and mediastinal contours post median sternotomy and CABG. Atherosclerotic plaque within the thoracic aorta. Re- demonstrated bilateral pleural calcifications, right greater than left. No new focal airspace opacities. No pleural effusion or pneumothorax. No evidence of edema. No acute osseus abnormalities. Stigmata of DISH within the thoracic spine. IMPRESSION: 1. Cardiomegaly without acute cardiopulmonary disease. 2. Re- demonstrated bilateral pleural calcifications compatible with prior asbestos exposure. 3.  Aortic Atherosclerosis (ICD10-170.0) Electronically Signed   By: Sandi Mariscal M.D.   On: 10/23/2015 18:39     Scheduled Meds: . amLODipine  5 mg Oral Daily  . aspirin  81 mg Oral QHS  . buPROPion  150 mg Oral BID  . chlorthalidone  25 mg Oral Daily  . cholecalciferol  1,000 Units Oral Daily  . citalopram  40 mg Oral Daily  . diltiazem  240 mg Oral Daily  . doxycycline  100 mg Oral Q12H  . fluticasone  2 spray Each Nare Daily  . furosemide  20 mg Oral Daily  . gabapentin  200 mg Oral TID  . guaiFENesin  1,200 mg Oral BID  .  insulin aspart  0-20 Units Subcutaneous TID WC  . insulin aspart  0-5 Units Subcutaneous QHS  . insulin detemir  73 Units Subcutaneous Q2200  . ipratropium-albuterol  3 mL Nebulization TID  . isosorbide mononitrate  30 mg Oral Daily  . methylPREDNISolone (SOLU-MEDROL) injection  60 mg Intravenous TID  . multivitamin with minerals  1 tablet Oral Daily  . pravastatin  40 mg Oral q1800  . ramipril  10 mg Oral BID  . rivaroxaban  20 mg Oral Q  supper  . tamsulosin  0.4 mg Oral Daily  . varenicline  1 mg Oral BID WC   Continuous Infusions:    LOS: 1 day   Time Spent in minutes   30 minutes  Shahid Flori D.O. on 10/24/2015 at 1:49 PM  Between 7am to 7pm - Pager - [phone]  After 7pm go to [email or web address] - password [email or web address]  And look for the night coverage person covering for me after hours  [facility] Office  [phone]

## 2015-10-24 NOTE — Progress Notes (Signed)
Pt. With CBG of 409 this am. On call NP, Tylene Fantasia,  made aware via text page.

## 2015-10-24 NOTE — Progress Notes (Signed)
SATURATION QUALIFICATIONS: (This note is used to comply with regulatory documentation for home oxygen)  Patient Saturations on Room Air at Rest = 95%  Patient Saturations on Room Air while Ambulating = 84% at normal velocity, 90-91% at slower velocity.   Will assess with supplemental O2 next session.    Blondell Reveal Kistler PT 10/24/2015 [phone]

## 2015-10-24 NOTE — Evaluation (Signed)
Physical Therapy Evaluation Patient Details Name: Joshua [name] MRN: [id number] DOB: October 18, 1940 Today's Date: 10/24/2015   History of Present Illness  Pt admitted with acute on chronic respiratory failure. PMH: COPD, HTN, DM, HLD, depression, pericardial effusion, PAF, obesity, CKD, CAD, CABG, tobacco use.  Clinical Impression  Pt admitted with above diagnosis. Pt currently with functional limitations due to the deficits listed below (see PT Problem List). Pt ambulated 160' with RW, SaO2 dropped to 84% on RA after 80', up to 95% on RA with 1 minute standing rest, 90-91% on RA with slower pace walking back to room. 1/4 dyspnea. Pt will benefit from skilled PT to increase their independence and safety with mobility to allow discharge to the venue listed below.       Follow Up Recommendations No PT follow up    Equipment Recommendations  None recommended by PT    Recommendations for Other Services       Precautions / Restrictions Precautions Precautions: Fall;Other (comment) Precaution Comments: monitor O2, h/o 2 falls in past year (feet got caught up in tall grass outside) Restrictions Weight Bearing Restrictions: No      Mobility  Bed Mobility Overal bed mobility: Independent                Transfers Overall transfer level: Modified independent                  Ambulation/Gait Ambulation/Gait assistance: Modified independent (Device/Increase time) Ambulation Distance (Feet): 160 Feet Assistive device: Rolling walker (2 wheeled) Gait Pattern/deviations: WFL(Within Functional Limits)   Gait velocity interpretation: at or above normal speed for age/gender General Gait Details: steady with RW, no LOB, SaO2 dropped to 84% on RA after 80', dyspnea 1/4, SaO2 came up to 95% on RA with 1 minute standing rest, with slower pace walking back to room SaO2 was 90-91%  Stairs            Wheelchair Mobility    Modified Rankin (Stroke Patients Only)        Balance Overall balance assessment: Modified Independent;History of Falls                                           Pertinent Vitals/Pain Pain Assessment: No/denies pain    Home Living Family/patient expects to be discharged to:: Private residence Living Arrangements: Spouse/significant other Available Help at Discharge: Family;Available 24 hours/day Type of Home: House Home Access: Ramped entrance     Home Layout: One level Home Equipment: Walker - 4 wheels;Cane - single point;Shower seat;Grab bars - toilet;Grab bars - tub/shower;Hand held shower head;Adaptive equipment      Prior Function Level of Independence: Independent with assistive device(s)         Comments: drives, uses 4WW outside, cane inside, sits to shower, sock aid for L foot     Hand Dominance   Dominant Hand: Right    Extremity/Trunk Assessment   Upper Extremity Assessment: Overall WFL for tasks assessed           Lower Extremity Assessment: Overall WFL for tasks assessed      Cervical / Trunk Assessment: Normal  Communication   Communication: HOH  Cognition Arousal/Alertness: Awake/alert Behavior During Therapy: WFL for tasks assessed/performed Overall Cognitive Status: Within Functional Limits for tasks assessed  General Comments      Exercises     Assessment/Plan    PT Assessment Patient needs continued PT services  PT Problem List Decreased activity tolerance;Cardiopulmonary status limiting activity          PT Treatment Interventions Gait training;Therapeutic exercise;Therapeutic activities;Patient/family education    PT Goals (Current goals can be found in the Care Plan section)  Acute Rehab PT Goals Patient Stated Goal: to go home tomorrow, likes to cook and go out for coffee with friends PT Goal Formulation: With patient Time For Goal Achievement: 11/07/15 Potential to Achieve Goals: Good    Frequency Min 3X/week    Barriers to discharge        Co-evaluation               End of Session Equipment Utilized During Treatment: Gait belt Activity Tolerance: Patient tolerated treatment well Patient left: in bed;with call bell/phone within reach Nurse Communication: Mobility status         Time: 1011-1027 PT Time Calculation (min) (ACUTE ONLY): 16 min   Charges:   PT Evaluation $PT Eval Low Complexity: 1 Procedure     PT G Codes:        [name] 10/24/2015, 10:37 AM [phone]

## 2015-10-24 NOTE — Care Management Note (Signed)
Case Management Note  Patient Details  Name: CEEJAY KEGLEY MRN: [id number] Date of Birth: 1940/03/18  Subjective/Objective:        Admitted with COPD            Action/Plan: Patient lives at home with spouse; Independent of ADL's; PCP PCP: Tammi Sou, MD; private insurance with [facility] with prescription drug coverage; No home 02 at this time. CM following for DCP  Expected Discharge Date:   possibly 10/25/2015               Expected Discharge Plan:  Home/Self Care  Discharge planning Services  CM Consult     Status of Service:  In process, will continue to follow  Sherrilyn Rist [phone] 10/24/2015, 1:50 PM

## 2015-10-24 NOTE — Progress Notes (Signed)
Inpatient Diabetes Program Recommendations  AACE/ADA: New Consensus Statement on Inpatient Glycemic Control (2015)  Target Ranges:  Prepandial:   less than 140 mg/dL      Peak postprandial:   less than 180 mg/dL (1-2 hours)      Critically ill patients:  140 - 180 mg/dL  Results for Joshua [name], Joshua [name] (MRN [id number]) as of 10/24/2015 11:28  Ref. Range 10/24/2015 00:25 10/24/2015 02:05 10/24/2015 06:32  Glucose-Capillary Latest Ref Range: 65 - 99 mg/dL 598 (HH) >600 (HH) 409 (H)    Review of Glycemic Control  Diabetes history: DM2 Outpatient Diabetes medications: Levemir 75 units QHS, Regular 16-18 units TID with meals, Actos 45 mg daily, Amaryl 8 mg QAM (per home med list pt is not taking Amaryl) Current orders for Inpatient glycemic control: Levemir 73 units QHS, Novolog 0-20 units TID with meals, Novolog 0-5 units QHS  Inpatient Diabetes Program Recommendations:  Insulin - Basal: Please consider increasing Levemir to 80 units QHS. Insulin - Meal Coverage: Please consider ordering Novolog 15 units TID with meals for meal coverage if patient eats at least 50% of meals.  NOTE: Patient is ordered steroids which are contributing to hyperglycemia. If steroids are continued and glucose does not improve with recommendations above; may want to consider transitioning patient to IV insulin per GlucoStabilizer.   Thanks, Barnie Alderman, RN, MSN, CDE Diabetes Coordinator Inpatient Diabetes Program [phone] (Team Pager from [place] to [place]) [phone] ([facility] office) [phone] [facility] office) [phone] [facility] office)

## 2015-10-24 NOTE — Progress Notes (Signed)
Nutrition Brief Note  RD consulted to assess nutritional status/ needs per COPD GOLD protocol.   Wt Readings from Last 15 Encounters:  10/24/15 246 lb 12.8 oz (111.9 kg)  09/27/15 247 lb 12.8 oz (112.4 kg)  09/26/15 248 lb 12.8 oz (112.9 kg)  08/21/15 247 lb 4 oz (112.2 kg)  07/22/15 249 lb 4 oz (113.1 kg)  07/08/15 250 lb 8 oz (113.6 kg)  05/17/15 249 lb 8 oz (113.2 kg)  05/13/15 247 lb 12.8 oz (112.4 kg)  04/08/15 247 lb 12 oz (112.4 kg)  02/26/15 243 lb (110.2 kg)  02/18/15 250 lb (113.4 kg)  02/08/15 250 lb 8 oz (113.6 kg)  02/06/15 249 lb (112.9 kg)  01/23/15 244 lb (110.7 kg)  01/14/15 247 lb (112 kg)   Joshua [name] is a 75 y.o. male with medical history significant of COPD, hypertension, hyperlipidemia, diabetes mellitus, depression, pericardial effusion with cardiac temponade, PAF on Xarelto, obesity, bilateral hearing loss, CKD-II, CAD, S/P of CABG, arthritis, tobacco abuse, asbestos exposure, who presents with shortness of breath.  Pt admitted with acute on chronic respiratory failure with hypoxia.   Spoke with pt and wife at bedside. Pt reports having a great appetite. However, pt expressed concern about not receiving breakfast sausage on breakfast tray ("I'm not a vegetarian).   Pt denies any weight loss. Noted wt stability over the past year.   Nutrition-Focused physical exam completed. Findings are no fat depletion, no muscle depletion, and mild edema.   Pt openly admits struggling with glycemic control. He confirms last Hgb A1c was 9.2 (last taken on 08/16/15); he was a follow-up appointment next month with PCP to address this. Home DM medications are as follows: 45 mg Actos daily, Novolin (16 units with breakfast, 17 units with lunch, and 18 units with dinner), and 75 units Levemir q HS. He confirms insuling regimen was adjusted at last PCP appointment. CBGS at home range from 100-150 per his report. Per pt, largest barrier for DM management is "I like to eat". Typical  meal intake is 5-6 meals daily (Breakfast: eggs; Lunch: sandwich on whole wheat bread; Dinner: meatloaf, baked potato, and pinot beans, 2-3 snacks of nabs). Beverages consist of diet soft drinks. Reviewed basics of low sodium/DM diet principles. Pt reports he does not add salt to the table or when cooking. Typical seasonings are dried herbs. Encouraged pt continue with lifestyle changes to assist with fluid and DM management. Teachback method used.   Body mass index is 34.42 kg/m. Patient meets criteria for obesity, class I based on current BMI.   Current diet order is Heart Healthy/ Carb Modified, patient is consuming approximately 100% of meals at this time. Labs and medications reviewed.   No nutrition interventions warranted at this time. If nutrition issues arise, please consult RD.   Derhonda Eastlick A. Jimmye Norman, RD, LDN, CDE Pager: [phone] After hours Pager: [phone]

## 2015-10-24 NOTE — Discharge Instructions (Addendum)
Information on my medicine - XARELTO (Rivaroxaban)  This medication education was reviewed with me or my healthcare representative as part of my discharge preparation.  Why was Xarelto prescribed for you? Xarelto was prescribed for you to reduce the risk of a blood clot forming that can cause a stroke if you have a medical condition called atrial fibrillation (a type of irregular heartbeat).  What do you need to know about xarelto ? Take your Xarelto ONCE DAILY at the same time every day with your evening meal. If you have difficulty swallowing the tablet whole, you may crush it and mix in applesauce just prior to taking your dose.  Take Xarelto exactly as prescribed by your doctor and DO NOT stop taking Xarelto without talking to the doctor who prescribed the medication.  Stopping without other stroke prevention medication to take the place of Xarelto may increase your risk of developing a clot that causes a stroke.  Refill your prescription before you run out.  After discharge, you should have regular check-up appointments with your healthcare provider that is prescribing your Xarelto.  In the future your dose may need to be changed if your kidney function or weight changes by a significant amount.  What do you do if you miss a dose? If you are taking Xarelto ONCE DAILY and you miss a dose, take it as soon as you remember on the same day then continue your regularly scheduled once daily regimen the next day. Do not take two doses of Xarelto at the same time or on the same day.   Important Safety Information A possible side effect of Xarelto is bleeding. You should call your healthcare provider right away if you experience any of the following: ? Bleeding from an injury or your nose that does not stop. ? Unusual colored urine (red or dark brown) or unusual colored stools (red or black). ? Unusual bruising for unknown reasons. ? A serious fall or if you hit your head (even if there  is no bleeding).  Some medicines may interact with Xarelto and might increase your risk of bleeding while on Xarelto. To help avoid this, consult your healthcare provider or pharmacist prior to using any new prescription or non-prescription medications, including herbals, vitamins, non-steroidal anti-inflammatory drugs (NSAIDs) and supplements.  This website has more information on Xarelto: [email or web address]

## 2015-10-24 NOTE — Progress Notes (Signed)
Patient CBG 476 at 2125.  Tylene Fantasia paged and made aware.  New orders received.  Will continue to monitor.

## 2015-10-25 DIAGNOSIS — I5032 Chronic diastolic (congestive) heart failure: Secondary | ICD-10-CM | POA: Diagnosis not present

## 2015-10-25 DIAGNOSIS — I130 Hypertensive heart and chronic kidney disease with heart failure and stage 1 through stage 4 chronic kidney disease, or unspecified chronic kidney disease: Secondary | ICD-10-CM | POA: Diagnosis not present

## 2015-10-25 DIAGNOSIS — L03314 Cellulitis of groin: Secondary | ICD-10-CM | POA: Diagnosis not present

## 2015-10-25 DIAGNOSIS — E1121 Type 2 diabetes mellitus with diabetic nephropathy: Secondary | ICD-10-CM | POA: Diagnosis not present

## 2015-10-25 DIAGNOSIS — Z951 Presence of aortocoronary bypass graft: Secondary | ICD-10-CM | POA: Diagnosis not present

## 2015-10-25 DIAGNOSIS — J441 Chronic obstructive pulmonary disease with (acute) exacerbation: Secondary | ICD-10-CM | POA: Diagnosis not present

## 2015-10-25 DIAGNOSIS — I2510 Atherosclerotic heart disease of native coronary artery without angina pectoris: Secondary | ICD-10-CM | POA: Diagnosis not present

## 2015-10-25 DIAGNOSIS — J9621 Acute and chronic respiratory failure with hypoxia: Secondary | ICD-10-CM | POA: Diagnosis not present

## 2015-10-25 DIAGNOSIS — I480 Paroxysmal atrial fibrillation: Secondary | ICD-10-CM | POA: Diagnosis not present

## 2015-10-25 DIAGNOSIS — E785 Hyperlipidemia, unspecified: Secondary | ICD-10-CM

## 2015-10-25 DIAGNOSIS — Z7901 Long term (current) use of anticoagulants: Secondary | ICD-10-CM | POA: Diagnosis not present

## 2015-10-25 DIAGNOSIS — Z77090 Contact with and (suspected) exposure to asbestos: Secondary | ICD-10-CM | POA: Diagnosis not present

## 2015-10-25 DIAGNOSIS — N182 Chronic kidney disease, stage 2 (mild): Secondary | ICD-10-CM | POA: Diagnosis not present

## 2015-10-25 DIAGNOSIS — M1990 Unspecified osteoarthritis, unspecified site: Secondary | ICD-10-CM | POA: Diagnosis not present

## 2015-10-25 DIAGNOSIS — N179 Acute kidney failure, unspecified: Secondary | ICD-10-CM | POA: Diagnosis not present

## 2015-10-25 LAB — BASIC METABOLIC PANEL
Anion gap: 7 (ref 5–15)
BUN: 43 mg/dL — AB (ref 6–20)
CO2: 23 mmol/L (ref 22–32)
CREATININE: 1.42 mg/dL — AB (ref 0.61–1.24)
Calcium: 8.4 mg/dL — ABNORMAL LOW (ref 8.9–10.3)
Chloride: 99 mmol/L — ABNORMAL LOW (ref 101–111)
GFR calc Af Amer: 54 mL/min — ABNORMAL LOW (ref >60)
GFR, EST NON AFRICAN AMERICAN: 46 mL/min — AB (ref >60)
GLUCOSE: 395 mg/dL — AB (ref 65–99)
POTASSIUM: 5.1 mmol/L (ref 3.5–5.1)
SODIUM: 129 mmol/L — AB (ref 135–145)

## 2015-10-25 LAB — CBC
HCT: 37.9 % — ABNORMAL LOW (ref 39.0–52.0)
Hemoglobin: 11.9 g/dL — ABNORMAL LOW (ref 13.0–17.0)
MCH: 24.7 pg — AB (ref 26.0–34.0)
MCHC: 31.4 g/dL (ref 30.0–36.0)
MCV: 78.8 fL (ref 78.0–100.0)
PLATELETS: 183 10*3/uL (ref 150–400)
RBC: 4.81 MIL/uL (ref 4.22–5.81)
RDW: 17 % — ABNORMAL HIGH (ref 11.5–15.5)
WBC: 13.3 10*3/uL — ABNORMAL HIGH (ref 4.0–10.5)

## 2015-10-25 LAB — GLUCOSE, CAPILLARY
GLUCOSE-CAPILLARY: 369 mg/dL — AB (ref 65–99)
GLUCOSE-CAPILLARY: 263 mg/dL — AB (ref 65–99)
GLUCOSE-CAPILLARY: 294 mg/dL — AB (ref 65–99)
GLUCOSE-CAPILLARY: 311 mg/dL — AB (ref 65–99)
Glucose-Capillary: 216 mg/dL — ABNORMAL HIGH (ref 65–99)
Glucose-Capillary: 350 mg/dL — ABNORMAL HIGH (ref 65–99)

## 2015-10-25 MED ORDER — METHYLPREDNISOLONE SODIUM SUCC 125 MG IJ SOLR
60.0000 mg | Freq: Every day | INTRAMUSCULAR | Status: DC
  Administered 2015-10-25 – 2015-10-26 (×2): 60 mg via INTRAVENOUS
  Filled 2015-10-25 (×2): qty 2

## 2015-10-25 MED ORDER — SODIUM CHLORIDE 0.9 % IV SOLN
INTRAVENOUS | Status: DC
  Administered 2015-10-25 – 2015-10-26 (×2): via INTRAVENOUS

## 2015-10-25 NOTE — Consult Note (Signed)
   [facility] Inpatient Consult   10/25/2015  Joshua [name] 02/23/1940 [id number]    Patient is currently active with [facility] for chronic disease management services. Patient has been engaged by [facility] CSW.Chart review reveals Pt admitted with acute on chronic respiratory failure. PMH: COPD, HTN, DM, HLD, depression, pericardial effusion, PAF, obesity, CKD, CAD, CABG, tobacco use.   Our community based plan of care has focused on disease management and community resource support. Patient will receive a post discharge transition of care call and will be evaluated for monthly home visits for assessments and disease process education. Met with the patient and his wife at the bedside.  Patient consents to ongoing [facility] follow up post hospital with nursing in the community and CSW as needed..   Of note, [facility] Management services does not replace or interfere with any services that are needed or arranged by inpatient case management or social work. For additional questions or referrals please contact:  Natividad Brood, RN BSN [facility] Liaison  [phone] business mobile phone Toll free office [phone]

## 2015-10-25 NOTE — Progress Notes (Signed)
Physical Therapy Treatment Patient Details Name: Joshua [name] MRN: [id number] DOB: [birth year] Today's Date: 10/25/2015    History of Present Illness Pt admitted with acute on chronic respiratory failure. PMH: COPD, HTN, DM, HLD, depression, pericardial effusion, PAF, obesity, CKD, CAD, CABG, tobacco use.    PT Comments    Patient just received breathing treatment from RT on arrival. Slightly "jittery"/shakey (?due to meds) which increased as pt fatigued. Did require 2L O2 to maintain SaO2 .87% (see separate O2 qualification note). Encouraged use of rollator vs RW on return home (pt prefers cane) until he regains his strength. Also discussed with pt and family the incr risk of falls with home O2 tubing.    Follow Up Recommendations  No PT follow up;Supervision/Assistance - 24 hour (pt will have home O2 (new) and risks with tubing )     Equipment Recommendations  None recommended by PT    Recommendations for Other Services       Precautions / Restrictions Precautions Precautions: Fall;Other (comment) Precaution Comments: monitor O2, h/o 2 falls in past year (feet got caught up in tall grass outside)    Mobility  Bed Mobility Overal bed mobility: Independent                Transfers Overall transfer level: Modified independent Equipment used: None             General transfer comment: use of UEs and wide BOS  Ambulation/Gait Ambulation/Gait assistance: Min assist Ambulation Distance (Feet): 30 Feet (on room air; on 2L 30, 60) Assistive device: None (rail in hallway (usually uses a cane)) Gait Pattern/deviations: Step-through pattern;Decreased stride length;Wide base of support ("shakey") Gait velocity: vc to slow down as he becomes anxious   General Gait Details: patient did not want to use RW (uses cane at home), however after 30 ft his legs became shakey and used handrail in hallway. On return to room, he agreed he would use his rollator at home until  stronger   Stairs            Wheelchair Mobility    Modified Rankin (Stroke Patients Only)       Balance Overall balance assessment: Needs assistance Sitting-balance support: No upper extremity supported;Feet supported Sitting balance-Leahy Scale: Fair     Standing balance support: No upper extremity supported Standing balance-Leahy Scale: Fair                      Cognition Arousal/Alertness: Awake/alert Behavior During Therapy: WFL for tasks assessed/performed Overall Cognitive Status: Within Functional Limits for tasks assessed                      Exercises      General Comments        Pertinent Vitals/Pain Pain Assessment: No/denies pain    Home Living                      Prior Function            PT Goals (current goals can now be found in the care plan section) Acute Rehab PT Goals Patient Stated Goal: to go home tomorrow Time For Goal Achievement: 11/07/15 Progress towards PT goals: Progressing toward goals    Frequency    Min 3X/week      PT Plan Current plan remains appropriate    Co-evaluation             End of Session Equipment  Utilized During Treatment: Gait belt;Oxygen Activity Tolerance: Treatment limited secondary to medical complications (Comment) (weakness; decr SaO2) Patient left: in bed;with call bell/phone within reach;with family/visitor present     Time: 0786-7544 PT Time Calculation (min) (ACUTE ONLY): 25 min  Charges:  $Gait Training: 23-37 mins                    G Codes:      Nayelis Bonito 2015/11/06, 3:27 PM Pager [phone]

## 2015-10-25 NOTE — Progress Notes (Signed)
Pts daughter requesting to speak with doctor.  MD paged.

## 2015-10-25 NOTE — Progress Notes (Addendum)
PROGRESS NOTE    Joshua [name]  XBW:[id number] DOB: 07-03-1940 DOA: 10/23/2015 PCP: Tammi Sou, MD   Chief Complaint  Patient presents with  . Shortness of Breath    Brief Narrative:  HPI on 10/23/2015 by Dr. Ivor Costa Joshua [name] is a 75 y.o. male with medical history significant of COPD, hypertension, hyperlipidemia, diabetes mellitus, depression, pericardial effusion with cardiac temponade, PAF on Xarelto, obesity, bilateral hearing loss, CKD-II, CAD, S/P of CABG, arthritis, tobacco abuse, asbestos exposure, who presents with shortness of breath.  Patient states that he started having worsening shortness of breath today, which has been progressively getting worse. He has wheezing and dry cough, but no fever, chills or chest pain. No runny nose or sore throat. Patient was seen in urgent care and noted O2 sats 88% on RA, he is not on home O2. He was given 180m solu-medrol and duoneb. EMS placed him on 2L [place] with improvement to 98% in O2 sats. He can speak in full sentence. He has bilateral  lower extremity swelling (L >R). He has tried his home inhalers with minimal relief. Patient does not have nausea, vomiting, abdominal pain, diarrhea, symptoms of UTI or unilateral weakness. Assessment & Plan   Acute respiratory failure with hypoxia/COPD exacerbation -Improving, currently O2 sats high 90s on room air -CXR: Unremarkable for infection  -Lower extremity Doppler negative for DVT -Unlikely be PE as patient is currently on Xarelto -Respiratory viral panel and influenza PCR unremarkable -Urine strep pneumococcal antigen negative -Initially placed on azithromycin however switched doxycycline -Continue Solu-Medrol however will begin to wean -Continue nebulizer treatments as needed -patient worked with PT, while ambulating, O2 sats were 84% on room air and normal velocity.  When patient walked slower, O2 satus 90-91%  Diabetes mellitus, type II with hyperglycemia -Increased to  Levemir 80u, added premeal insulin 15u -Continue resistant sliding scale -Patient is currently on IV Solu-Medrol- weaning dose -Continue CBG monitoring  Acute kidney injury -Creatinine bumped to 1.42 -Hold lasix -Will give gentle IVF and monitor BMP  Chronic diastolic heart failure -Echocardiogram October 2016 showed an EF of 60-65% -Currently appears euvolemic and compensated -Lasix held due to AKI -Monitor intake and output, daily weights  Essential hypertension -Continue amlodipine, or thiazide, Cardizem, Lasix, ramipril  Incidental finding/cellulitis -3.4 similar heterogeneous area of the left groin, suggestive of possible enlarged inguinal lymph node -Will have area marked. Per patient, area is where he normally injects his insulin. Does appear the redness is improving -Continue doxycycline -Patient will need follow-up with his primary care physician  Depression/anxiety -Continue Wellbutrin, Celexa  Coronary artery disease -Status post CABG. -Currently chest pain-free -Continue aspirin, statin, Imdur  Atrial fibrillation -CHADSVASC 7 -Continue Xarelto  Tobacco abuse -Continue Chantix -Discussed smoking cessation  Hyperlipidemia -Continue statin  Leukocytosis -Likely reactive to steroids -Continue to monitor CBC -Currently afebrile  DVT Prophylaxis  Xarelto  Code Status: Full  Family Communication: Wife at bedside  Disposition Plan: Admitted. Possible discharge within 24-48 hours, pending improvement.  Consultants None  Procedures  Lower extremity doppler  Antibiotics   Anti-infectives    Start     Dose/Rate Route Frequency Ordered Stop   10/24/15 1400  doxycycline (VIBRA-TABS) tablet 100 mg     100 mg Oral Every 12 hours 10/24/15 1348     10/24/15 1000  azithromycin (ZITHROMAX) tablet 250 mg  Status:  Discontinued     250 mg Oral Daily 10/23/15 2156 10/24/15 1348   10/23/15 2200  azithromycin (ZITHROMAX) tablet 500 mg  500 mg Oral Daily  10/23/15 2156 10/24/15 0020   10/23/15 2115  ceFAZolin (ANCEF) IVPB 1 g/50 mL premix     1 g 100 mL/hr over 30 Minutes Intravenous  Once 10/23/15 2107 10/24/15 0759      Subjective:   Joshua [name] seen and examined today.  Patient states breathing has improved.  Does have occasional cough.  Noticed that spot in on left inner thigh is improving. Patient denies any current chest pain, abdominal pain, nausea or vomiting, diarrhea or constipation, dizziness or headache.  Objective:   Vitals:   10/24/15 2133 10/24/15 2137 10/25/15 0800 10/25/15 1258  BP: 123/70  (!) 113/46 (!) 115/38  Pulse: 83  68 70  Resp: _0 Temp: 97.8 F (36.6 C)  98.1 F (36.7 C) 98.1 F (36.7 C)  TempSrc: Oral  Oral Oral  SpO2: 100% 95% 92% 94%  Weight:      Height:        Intake/Output Summary (Last 24 hours) at 10/25/15 1306 Last data filed at 10/25/15 1123  Gross per 24 hour  Intake             1415 ml  Output             1400 ml  Net               15 ml   Filed Weights   10/23/15 1710 10/23/15 2259 10/24/15 0415  Weight: 114.3 kg (252 lb) 112.9 kg (248 lb 14.4 oz) 111.9 kg (246 lb 12.8 oz)    Exam  General: Well developed, well nourished, NAD, appears stated age  HEENT: NCAT,  mucous membranes moist.   Cardiovascular: S1 S2 auscultated, RRR  Respiratory: Diminished breath sounds, but clear, no wheezing.   Abdomen: Soft, nontender, nondistended, + bowel sounds  Extremities: warm dry without cyanosis clubbing. LLE edema  Neuro: AAOx3, Nonfocal  Skin: Small area of bruising and erythema noted medial left thigh- Improving and outlined  Psych: Normal affect and demeanor with intact judgement and insight, pleasant   Data Reviewed: I have personally reviewed following labs and imaging studies  CBC:  Recent Labs Lab 10/23/15 1710 10/25/15 0312  WBC 8.9 13.3*  NEUTROABS 5.6  --   HGB 13.7 11.9*  HCT 44.0 37.9*  MCV 80.0 78.8  PLT 170 354   Basic Metabolic Panel:  Recent  Labs Lab 10/23/15 1710 10/23/15 1900 10/24/15 0214 10/25/15 0312  NA 138  --  132* 129*  K 4.7  --  4.5 5.1  CL 107  --  101 99*  CO2 21*  --  20* 23  GLUCOSE 212*  --  653* 395*  BUN 20  --  25* 43*  CREATININE 1.07  --  1.11 1.42*  CALCIUM 8.9  --  8.6* 8.4*  MG  --  2.3  --   --    GFR: Estimated Creatinine Clearance: 56.3 mL/min (by C-G formula based on SCr of 1.42 mg/dL (H)). Liver Function Tests: No results for input(s): AST, ALT, ALKPHOS, BILITOT, PROT, ALBUMIN in the last 168 hours. No results for input(s): LIPASE, AMYLASE in the last 168 hours. No results for input(s): AMMONIA in the last 168 hours. Coagulation Profile: No results for input(s): INR, PROTIME in the last 168 hours. Cardiac Enzymes:  Recent Labs Lab 10/23/15 1831  TROPONINI <0.03   BNP (last 3 results) No results for input(s): PROBNP in the last 8760 hours. HbA1C: No results for input(s): HGBA1C  in the last 72 hours. CBG:  Recent Labs Lab 10/24/15 2125 10/25/15 0319 10/25/15 0641 10/25/15 0802 10/25/15 1200  GLUCAP 476* 369* 263* 216* 294*   Lipid Profile: No results for input(s): CHOL, HDL, LDLCALC, TRIG, CHOLHDL, LDLDIRECT in the last 72 hours. Thyroid Function Tests: No results for input(s): TSH, T4TOTAL, FREET4, T3FREE, THYROIDAB in the last 72 hours. Anemia Panel: No results for input(s): VITAMINB12, FOLATE, FERRITIN, TIBC, IRON, RETICCTPCT in the last 72 hours. Urine analysis:    Component Value Date/Time   COLORURINE YELLOW 10/23/2015 [place] 10/23/2015 1850   LABSPEC 1.012 10/23/2015 1850   PHURINE 5.5 10/23/2015 1850   GLUCOSEU 250 (A) 10/23/2015 1850   HGBUR NEGATIVE 10/23/2015 1850   BILIRUBINUR NEGATIVE 10/23/2015 1850   KETONESUR NEGATIVE 10/23/2015 1850   PROTEINUR NEGATIVE 10/23/2015 1850   UROBILINOGEN 0.2 08/11/2013 1624   NITRITE NEGATIVE 10/23/2015 1850   LEUKOCYTESUR NEGATIVE 10/23/2015 1850   Sepsis  Labs: _0 (procalcitonin:4,lacticidven:4)  ) Recent Results (from the past 240 hour(s))  Respiratory Panel by PCR     Status: None   Collection Time: 10/23/15 11:24 PM  Result Value Ref Range Status   Adenovirus NOT DETECTED NOT DETECTED Final   Coronavirus 229E NOT DETECTED NOT DETECTED Final   Coronavirus HKU1 NOT DETECTED NOT DETECTED Final   Coronavirus NL63 NOT DETECTED NOT DETECTED Final   Coronavirus OC43 NOT DETECTED NOT DETECTED Final   Metapneumovirus NOT DETECTED NOT DETECTED Final   Rhinovirus / Enterovirus NOT DETECTED NOT DETECTED Final   Influenza A NOT DETECTED NOT DETECTED Final   Influenza B NOT DETECTED NOT DETECTED Final   Parainfluenza Virus 1 NOT DETECTED NOT DETECTED Final   Parainfluenza Virus 2 NOT DETECTED NOT DETECTED Final   Parainfluenza Virus 3 NOT DETECTED NOT DETECTED Final   Parainfluenza Virus 4 NOT DETECTED NOT DETECTED Final   Respiratory Syncytial Virus NOT DETECTED NOT DETECTED Final   Bordetella pertussis NOT DETECTED NOT DETECTED Final   Chlamydophila pneumoniae NOT DETECTED NOT DETECTED Final   Mycoplasma pneumoniae NOT DETECTED NOT DETECTED Final  Culture, blood (routine x 2) Call MD if unable to obtain prior to antibiotics being given     Status: None (Preliminary result)   Collection Time: 10/23/15 11:54 PM  Result Value Ref Range Status   Specimen Description BLOOD BLOOD LEFT HAND  Final   Special Requests BOTTLES DRAWN AEROBIC ONLY  8CC  Final   Culture NO GROWTH 1 DAY  Final   Report Status PENDING  Incomplete  Culture, blood (routine x 2) Call MD if unable to obtain prior to antibiotics being given     Status: None (Preliminary result)   Collection Time: 10/24/15 12:03 AM  Result Value Ref Range Status   Specimen Description BLOOD RIGHT HAND  Final   Special Requests IN PEDIATRIC BOTTLE  4CC  Final   Culture NO GROWTH 1 DAY  Final   Report Status PENDING  Incomplete  Culture, sputum-assessment     Status: None   Collection  Time: 10/24/15  1:46 PM  Result Value Ref Range Status   Specimen Description EXPECTORATED SPUTUM  Final   Special Requests NONE  Final   Sputum evaluation   Final    MICROSCOPIC FINDINGS SUGGEST THAT THIS SPECIMEN IS NOT REPRESENTATIVE OF LOWER RESPIRATORY SECRETIONS. PLEASE RECOLLECT. Results Called to: S. LEWIS, RN FOR RECOLLECT AT 1749 ON 10/24/15 BY C. JESSUP, MLT. CANCELLED AND REORDERED.    Report Status 10/24/2015 FINAL  Final  Radiology Studies: Dg Chest 2 View  Result Date: 10/23/2015 CLINICAL DATA:  Shortness of breath, dry cough and chest pain. History of COPD. History of smoking. EXAM: CHEST  2 VIEW COMPARISON:  10/29/2014; 09/29/2013; 06/30/2013; chest CT - 06/24/2013 FINDINGS: Grossly unchanged cardiac silhouette and mediastinal contours post median sternotomy and CABG. Atherosclerotic plaque within the thoracic aorta. Re- demonstrated bilateral pleural calcifications, right greater than left. No new focal airspace opacities. No pleural effusion or pneumothorax. No evidence of edema. No acute osseus abnormalities. Stigmata of DISH within the thoracic spine. IMPRESSION: 1. Cardiomegaly without acute cardiopulmonary disease. 2. Re- demonstrated bilateral pleural calcifications compatible with prior asbestos exposure. 3.  Aortic Atherosclerosis (ICD10-170.0) Electronically Signed   By: Sandi Mariscal M.D.   On: 10/23/2015 18:39     Scheduled Meds: . amLODipine  5 mg Oral Daily  . aspirin  81 mg Oral QHS  . buPROPion  150 mg Oral BID  . chlorthalidone  25 mg Oral Daily  . cholecalciferol  1,000 Units Oral Daily  . citalopram  40 mg Oral Daily  . diltiazem  240 mg Oral Daily  . doxycycline  100 mg Oral Q12H  . fluticasone  2 spray Each Nare Daily  . gabapentin  200 mg Oral TID  . guaiFENesin  1,200 mg Oral BID  . insulin aspart  0-20 Units Subcutaneous Q4H  . insulin aspart  15 Units Subcutaneous TID WC  . insulin detemir  80 Units Subcutaneous Q2200  .  ipratropium-albuterol  3 mL Nebulization TID  . isosorbide mononitrate  30 mg Oral Daily  . methylPREDNISolone (SOLU-MEDROL) injection  60 mg Intravenous Daily  . multivitamin with minerals  1 tablet Oral Daily  . pravastatin  40 mg Oral q1800  . ramipril  10 mg Oral BID  . rivaroxaban  20 mg Oral Q supper  . tamsulosin  0.4 mg Oral Daily  . varenicline  1 mg Oral BID WC   Continuous Infusions: . sodium chloride 50 mL/hr at 10/25/15 1116     LOS: 2 days   Time Spent in minutes   30 minutes  Tamarius Rosenfield D.O. on 10/25/2015 at 1:06 PM  Between 7am to 7pm - Pager - [phone]  After 7pm go to [email or web address] - password [email or web address]  And look for the night coverage person covering for me after hours  [facility] Office  [phone]

## 2015-10-25 NOTE — Progress Notes (Addendum)
Patient is for possible home oxygen; awaiting for documentation on qualifying saturation for home 02. Mindi Slicker [facility] [phone]  1523:  Joshua [name] with [facility] called for home 02 for possible tomorrow. Mindi Slicker [facility] [phone]

## 2015-10-25 NOTE — Progress Notes (Signed)
SATURATION QUALIFICATIONS: (This note is used to comply with regulatory documentation for home oxygen)  Patient Saturations on Room Air at Rest = 90%  Patient Saturations on Room Air while Ambulating = 87% (after 30 ft)  Patient Saturations on 2 Liters of oxygen while Ambulating = 90%  Please briefly explain why patient needs home oxygen:  To keep SaO2 >87% with activity   10/25/2015 Barry Brunner, PT Pager: [phone]

## 2015-10-26 DIAGNOSIS — I2510 Atherosclerotic heart disease of native coronary artery without angina pectoris: Secondary | ICD-10-CM | POA: Diagnosis not present

## 2015-10-26 DIAGNOSIS — J9621 Acute and chronic respiratory failure with hypoxia: Secondary | ICD-10-CM | POA: Diagnosis not present

## 2015-10-26 DIAGNOSIS — Z7901 Long term (current) use of anticoagulants: Secondary | ICD-10-CM | POA: Diagnosis not present

## 2015-10-26 DIAGNOSIS — L03314 Cellulitis of groin: Secondary | ICD-10-CM | POA: Diagnosis not present

## 2015-10-26 DIAGNOSIS — I5032 Chronic diastolic (congestive) heart failure: Secondary | ICD-10-CM | POA: Diagnosis not present

## 2015-10-26 DIAGNOSIS — I130 Hypertensive heart and chronic kidney disease with heart failure and stage 1 through stage 4 chronic kidney disease, or unspecified chronic kidney disease: Secondary | ICD-10-CM | POA: Diagnosis not present

## 2015-10-26 DIAGNOSIS — J441 Chronic obstructive pulmonary disease with (acute) exacerbation: Secondary | ICD-10-CM | POA: Diagnosis not present

## 2015-10-26 DIAGNOSIS — N182 Chronic kidney disease, stage 2 (mild): Secondary | ICD-10-CM | POA: Diagnosis not present

## 2015-10-26 DIAGNOSIS — E785 Hyperlipidemia, unspecified: Secondary | ICD-10-CM | POA: Diagnosis not present

## 2015-10-26 DIAGNOSIS — Z951 Presence of aortocoronary bypass graft: Secondary | ICD-10-CM | POA: Diagnosis not present

## 2015-10-26 DIAGNOSIS — Z77090 Contact with and (suspected) exposure to asbestos: Secondary | ICD-10-CM | POA: Diagnosis not present

## 2015-10-26 DIAGNOSIS — E1121 Type 2 diabetes mellitus with diabetic nephropathy: Secondary | ICD-10-CM | POA: Diagnosis not present

## 2015-10-26 DIAGNOSIS — N179 Acute kidney failure, unspecified: Secondary | ICD-10-CM | POA: Diagnosis not present

## 2015-10-26 DIAGNOSIS — M1990 Unspecified osteoarthritis, unspecified site: Secondary | ICD-10-CM | POA: Diagnosis not present

## 2015-10-26 DIAGNOSIS — I480 Paroxysmal atrial fibrillation: Secondary | ICD-10-CM | POA: Diagnosis not present

## 2015-10-26 LAB — CBC
HEMATOCRIT: 37.8 % — AB (ref 39.0–52.0)
HEMOGLOBIN: 11.7 g/dL — AB (ref 13.0–17.0)
MCH: 24.5 pg — ABNORMAL LOW (ref 26.0–34.0)
MCHC: 31 g/dL (ref 30.0–36.0)
MCV: 79.1 fL (ref 78.0–100.0)
Platelets: 187 10*3/uL (ref 150–400)
RBC: 4.78 MIL/uL (ref 4.22–5.81)
RDW: 17 % — AB (ref 11.5–15.5)
WBC: 12.1 10*3/uL — AB (ref 4.0–10.5)

## 2015-10-26 LAB — BASIC METABOLIC PANEL
ANION GAP: 8 (ref 5–15)
BUN: 58 mg/dL — ABNORMAL HIGH (ref 6–20)
CALCIUM: 8.2 mg/dL — AB (ref 8.9–10.3)
CO2: 22 mmol/L (ref 22–32)
CREATININE: 1.35 mg/dL — AB (ref 0.61–1.24)
Chloride: 99 mmol/L — ABNORMAL LOW (ref 101–111)
GFR, EST AFRICAN AMERICAN: 57 mL/min — AB (ref >60)
GFR, EST NON AFRICAN AMERICAN: 49 mL/min — AB (ref >60)
Glucose, Bld: 284 mg/dL — ABNORMAL HIGH (ref 65–99)
Potassium: 4.7 mmol/L (ref 3.5–5.1)
SODIUM: 129 mmol/L — AB (ref 135–145)

## 2015-10-26 LAB — GLUCOSE, CAPILLARY
GLUCOSE-CAPILLARY: 251 mg/dL — AB (ref 65–99)
GLUCOSE-CAPILLARY: 301 mg/dL — AB (ref 65–99)
Glucose-Capillary: 341 mg/dL — ABNORMAL HIGH (ref 65–99)
Glucose-Capillary: 349 mg/dL — ABNORMAL HIGH (ref 65–99)

## 2015-10-26 MED ORDER — INSULIN DETEMIR 100 UNIT/ML FLEXPEN: PEN_INJECTOR | SUBCUTANEOUS | 3 refills | Status: DC

## 2015-10-26 MED ORDER — DOXYCYCLINE HYCLATE 100 MG PO TABS: 100.0000 mg | ORAL_TABLET | Freq: Two times a day (BID) | ORAL | 0 refills | Status: DC

## 2015-10-26 MED ORDER — PREDNISONE 20 MG PO TABS: ORAL_TABLET | ORAL | 0 refills | Status: DC

## 2015-10-26 NOTE — Discharge Summary (Signed)
Physician Discharge Summary  Joshua [name] PYK:[id number] DOB: March 22, 1940 DOA: 10/23/2015  PCP: Tammi Sou, MD  Admit date: 10/23/2015 Discharge date: 10/26/2015  Time spent: 45 minutes  Recommendations for Outpatient Follow-up:  Patient will be discharged to home with home oxygen.  Patient will need to follow up with primary care provider within one week of discharge, repeat CBC and BMP.  Patient should continue medications as prescribed.  Patient should follow a heart healthy/carb modified diet.   Discharge Diagnoses:  Acute respiratory failure with hypoxia/COPD exacerbation Diabetes mellitus, type II with hyperglycemia Acute kidney injury Chronic diastolic heart failure Essential hypertension Incidental finding/cellulitis Depression/anxiety Coronary artery disease Atrial fibrillation Tobacco abuse Hyperlipidemia Leukocytosis  Discharge Condition: Stable  Diet recommendation: heart healthy/carb modified  Filed Weights   10/23/15 2259 10/24/15 0415 10/26/15 0654  Weight: 112.9 kg (248 lb 14.4 oz) 111.9 kg (246 lb 12.8 oz) 106.9 kg (235 lb 9.6 oz)    History of present illness:  on 10/23/2015 by Dr. Clide Cliff [name] a 75 y.o.malewith medical history significant of COPD, hypertension, hyperlipidemia, diabetes mellitus, depression, pericardial effusion with cardiac temponade, PAF on Xarelto, obesity, bilateral hearing loss, CKD-II, CAD, S/P ofCABG, arthritis, tobacco abuse, asbestos exposure, who presents with shortness of breath.  Patient states that he started having worsening shortness of breath today, which has been progressively getting worse. He has wheezing and dry cough, but no fever, chills or chest pain. No runny nose or sore throat. Patient was seen in urgent care and noted O2 sats 88% on RA, he is not on home O2. He was given 153m solu-medrol and duoneb. EMS placed him on 2L [place] with improvement to 98% in O2 sats. He can speak in full sentence.  He hasbilateral lower extremity swelling (L >R). He has tried his home inhalers with minimal relief. Patient does not have nausea, vomiting, abdominal pain, diarrhea, symptoms of UTI or unilateral weakness.  Hospital Course:  Acute respiratory failure with hypoxia/COPD exacerbation -Improving, currently O2 sats high 90s on room air -CXR: Unremarkable for infection  -Lower extremity Doppler negative for DVT -Unlikely be PE as patient is currently on Xarelto -Respiratory viral panel and influenza PCR unremarkable -Urine strep pneumococcal antigen negative -Initially placed on azithromycin however switched doxycycline -Was placed on Solu-Medrol, will discharge with prednisone taper -Was placed on nebs treatments -patient worked with PT, while ambulating, O2 sats were 84% on room air and normal velocity.  When patient walked slower, O2 satus 90-91% -Patient will be discharged with home oxygen to utilize during ambulation, activity, exertion  Diabetes mellitus, type II with hyperglycemia -During hospitalization, Increased to Levemir 80u, added premeal insulin 15u. Placed on resistant sliding scale -Weaned solumedrol -Continue CBG monitoring -Discharging with home regimen, however, increased levemir to 80u -Discussed injecting insulin into different sites  Acute kidney injury vs ?CKD -Creatinine bumped to 1.42, improved to 1.35 -Hold lasix and ramipril, chlorthalidone -Repeat BMP in one week  Chronic diastolic heart failure -Echocardiogram October 2016 showed an EF of 60-65% -Currently appears euvolemic and compensated -Lasix held due to AKI -Monitored intake and output, daily weights  Essential hypertension -Continue amlodipine and Cardizem -Lasix, chlorthalidone  Incidental finding/cellulitis -3.4 similar heterogeneous area of the left groin, suggestive of possible enlarged inguinal lymph node -Will have area marked. Per patient, area is where he normally injects his  insulin. Does appear the redness is improving -Continue doxycycline -Patient will need follow-up with his primary care physician  Depression/anxiety -Continue Wellbutrin, Celexa  Coronary artery  disease -Status post CABG. -Currently chest pain-free -Continue aspirin, statin, Imdur  Atrial fibrillation -CHADSVASC 7 -Continue Xarelto  Tobacco abuse -Continue Chantix -Discussed smoking cessation  Hyperlipidemia -Continue statin  Leukocytosis -Likely reactive to steroids, WBC improved with weaning of steroids -Currently 12.1 -Continue to monitor CBC -Currently afebrile  Consultants None  Procedures  Lower extremity doppler  Discharge Exam: Vitals:   10/26/15 0654 10/26/15 0800  BP: (!) 126/53 (!) 139/53  Pulse: 70 81  Resp: 18 18  Temp: 97.9 F (36.6 C) 98.4 F (36.9 C)   Patient states breathing has improved.  Does have occasional cough.  Noticed that spot in on left inner thigh is gone. Patient denies chest pain, abdominal pain, nausea or vomiting, diarrhea or constipation, dizziness or headache.  Exam  General: Well developed, well nourished, NAD  HEENT: NCAT,  mucous membranes moist.   Cardiovascular: S1 S2 auscultated, RRR, no murmurs appreciated  Respiratory: Diminished breath sounds, but clear, no wheezing.   Abdomen: Soft, nontender, nondistended, + bowel sounds  Extremities: warm dry without cyanosis clubbing. LLE edema- no change  Neuro: AAOx3, Nonfocal  Skin: no masses or rash  Psych: Normal affect and demeanor with intact judgement and insight, pleasant  Discharge Instructions Discharge Instructions    AMB Referral to [place] [facility]    Complete by:  As directed    Reason for consult:  Active patient - post hospital follow up,   Diagnoses of:   COPD/ Pneumonia Heart Failure     Expected date of contact:  1-3 days (reserved for hospital discharges)   Patientis active with social worker for post hospital follow up continue  as needed. Please assign to community nurse for transition of care calls and assess for home visits. Questions please call:   Natividad Brood, RN BSN [facility] Liaison  [phone] business mobile phone Toll free office [phone]   Discharge instructions    Complete by:  As directed    Patient will be discharged to home with home oxygen.  Patient will need to follow up with primary care provider within one week of discharge, repeat CBC and BMP.  Patient should continue medications as prescribed.  Patient should follow a heart healthy/carb modified diet.  Advised to avoid sun exposure while taking antibiotics.  Hold lasix, chlorthalidone, ramipril until seen by your primary doctor and you have repeat blood work.     Current Discharge Medication List    START taking these medications   Details  doxycycline (VIBRA-TABS) 100 MG tablet Take 1 tablet (100 mg total) by mouth every 12 (twelve) hours. Qty: 10 tablet, Refills: 0      CONTINUE these medications which have CHANGED   Details  Insulin Detemir (LEVEMIR FLEXTOUCH) 100 UNIT/ML Pen Inject 80 Units into the  skin at bedtime.  Change site of injection with every use. Qty: 45 mL, Refills: 3    predniSONE (DELTASONE) 20 MG tablet Take 76m (3 tabs) x 2 days, then taper to 415m(2 tabs) x 2 days, then 2061m1 tabs) x 2days, then stop. Qty: 12 tablet, Refills: 0      CONTINUE these medications which have NOT CHANGED   Details  acetaminophen (TYLENOL) 325 MG tablet Take 2 tablets (650 mg total) by mouth every 4 (four) hours as needed for headache or mild pain.    !! albuterol (PROVENTIL HFA;VENTOLIN HFA) 108 (90 Base) MCG/ACT inhaler Inhale 2 puffs into the lungs every 6 (six) hours as needed for wheezing or shortness  of breath. Qty: 1 Inhaler, Refills: 6    amLODipine (NORVASC) 5 MG tablet Take 1 tablet (5 mg total) by mouth daily. Qty: 30 tablet, Refills: 11    aspirin 81 MG tablet Take 81 mg by mouth at  bedtime. Reported on 02/26/2015    budesonide-formoterol (SYMBICORT) 160-4.5 MCG/ACT inhaler Inhale 2 puffs into the lungs 2 (two) times daily. Qty: 1 Inhaler, Refills: 12    buPROPion (WELLBUTRIN SR) 150 MG 12 hr tablet Take 1 tablet by mouth two  times daily Qty: 60 tablet, Refills: 3    CHANTIX CONTINUING MONTH PAK 1 MG tablet TAKE ONE TABLET BY MOUTH TWICE DAILY Qty: 60 tablet, Refills: 0    Cholecalciferol (VITAMIN D-3) 1000 units CAPS Take 1,000 Units by mouth daily. Reported on 02/26/2015    Chromium Picolinate 1000 MCG TABS Take 1,000 mcg by mouth daily. Reported on 02/26/2015    citalopram (CELEXA) 40 MG tablet Take 1 tablet (40 mg total) by mouth daily. Qty: 90 tablet, Refills: 3    diltiazem (CARDIZEM CD) 240 MG 24 hr capsule Take 1 capsule (240 mg total) by mouth daily. Qty: 30 capsule, Refills: 1    fluticasone (FLONASE) 50 MCG/ACT nasal spray Place 2 sprays into both nostrils daily. Qty: 16 g, Refills: 1   Associated Diagnoses: Acute upper respiratory infection    gabapentin (NEURONTIN) 100 MG capsule 2 caps po tid Qty: 180 capsule, Refills: 4    guaiFENesin (MUCINEX) 600 MG 12 hr tablet Take 2 tablets (1,200 mg total) by mouth 2 (two) times daily. Qty: 30 tablet, Refills: 0    insulin regular (NOVOLIN R,HUMULIN R) 100 units/mL injection Inject 16 units SQ with BF, 17 units SQ with Lunch, and 18 U SQ with Supper Qty: 1 vial, Refills: 6    isosorbide mononitrate (IMDUR) 30 MG 24 hr tablet Take 1 tablet by mouth  daily Qty: 90 tablet, Refills: 3    lovastatin (MEVACOR) 40 MG tablet Take 40 mg by mouth every morning.     Multiple Vitamin (MULTIVITAMIN WITH MINERALS) TABS Take 1 tablet by mouth daily.    nitroGLYCERIN (NITROSTAT) 0.4 MG SL tablet Place 1 tablet (0.4 mg total) under the tongue every 5 (five) minutes as needed for chest pain. Qty: 50 tablet, Refills: 3    pioglitazone (ACTOS) 45 MG tablet Take 45 mg by mouth daily. Reported on 02/26/2015      rivaroxaban (XARELTO) 20 MG TABS tablet Take 1 tablet (20 mg total) by mouth daily with supper. Qty: 90 tablet, Refills: 4    tamsulosin (FLOMAX) 0.4 MG CAPS capsule TAKE 1 CAPSULE BY MOUTH EVERY DAY AFTER SUPPER Qty: 90 capsule, Refills: 1    !! PROAIR HFA 108 (90 Base) MCG/ACT inhaler INHALE 2 PUFFS INTO THE LUNGS EVERY 4 HOURS AS NEEDED FOR WHEEZING ORSHORTNESS OF BREATH Qty: 8.5 g, Refills: 6     !! - Potential duplicate medications found. Please discuss with provider.    STOP taking these medications     chlorthalidone (HYGROTON) 25 MG tablet      furosemide (LASIX) 40 MG tablet      ramipril (ALTACE) 10 MG capsule      azithromycin (ZITHROMAX) 250 MG tablet      glimepiride (AMARYL) 4 MG tablet        Allergies  Allergen Reactions  . Morphine And Related Other (See Comments)    Drenched with perspiration  . Demerol [Meperidine] Nausea Only  . Starlix [Nateglinide] Other (See Comments)  gassy   Follow-up Information    MCGOWEN,PHILIP H, MD. Schedule an appointment as soon as possible for a visit in 1 week(s).   Specialty:  Family Medicine Why:  Hospital follow up, repeat labs, discuss blood pressure medications Contact information: [address] [place] [address] [phone]            The results of significant diagnostics from this hospitalization (including imaging, microbiology, ancillary and laboratory) are listed below for reference.    Significant Diagnostic Studies: Dg Chest 2 View  Result Date: 10/23/2015 CLINICAL DATA:  Shortness of breath, dry cough and chest pain. History of COPD. History of smoking. EXAM: CHEST  2 VIEW COMPARISON:  10/29/2014; 09/29/2013; 06/30/2013; chest CT - 06/24/2013 FINDINGS: Grossly unchanged cardiac silhouette and mediastinal contours post median sternotomy and CABG. Atherosclerotic plaque within the thoracic aorta. Re- demonstrated bilateral pleural calcifications, right greater than left. No new focal  airspace opacities. No pleural effusion or pneumothorax. No evidence of edema. No acute osseus abnormalities. Stigmata of DISH within the thoracic spine. IMPRESSION: 1. Cardiomegaly without acute cardiopulmonary disease. 2. Re- demonstrated bilateral pleural calcifications compatible with prior asbestos exposure. 3.  Aortic Atherosclerosis (ICD10-170.0) Electronically Signed   By: Sandi Mariscal M.D.   On: 10/23/2015 18:39    Microbiology: Recent Results (from the past 240 hour(s))  Respiratory Panel by PCR     Status: None   Collection Time: 10/23/15 11:24 PM  Result Value Ref Range Status   Adenovirus NOT DETECTED NOT DETECTED Final   Coronavirus 229E NOT DETECTED NOT DETECTED Final   Coronavirus HKU1 NOT DETECTED NOT DETECTED Final   Coronavirus NL63 NOT DETECTED NOT DETECTED Final   Coronavirus OC43 NOT DETECTED NOT DETECTED Final   Metapneumovirus NOT DETECTED NOT DETECTED Final   Rhinovirus / Enterovirus NOT DETECTED NOT DETECTED Final   Influenza A NOT DETECTED NOT DETECTED Final   Influenza B NOT DETECTED NOT DETECTED Final   Parainfluenza Virus 1 NOT DETECTED NOT DETECTED Final   Parainfluenza Virus 2 NOT DETECTED NOT DETECTED Final   Parainfluenza Virus 3 NOT DETECTED NOT DETECTED Final   Parainfluenza Virus 4 NOT DETECTED NOT DETECTED Final   Respiratory Syncytial Virus NOT DETECTED NOT DETECTED Final   Bordetella pertussis NOT DETECTED NOT DETECTED Final   Chlamydophila pneumoniae NOT DETECTED NOT DETECTED Final   Mycoplasma pneumoniae NOT DETECTED NOT DETECTED Final  Culture, blood (routine x 2) Call MD if unable to obtain prior to antibiotics being given     Status: None (Preliminary result)   Collection Time: 10/23/15 11:54 PM  Result Value Ref Range Status   Specimen Description BLOOD BLOOD LEFT HAND  Final   Special Requests BOTTLES DRAWN AEROBIC ONLY  8CC  Final   Culture NO GROWTH 1 DAY  Final   Report Status PENDING  Incomplete  Culture, blood (routine x 2) Call MD if  unable to obtain prior to antibiotics being given     Status: None (Preliminary result)   Collection Time: 10/24/15 12:03 AM  Result Value Ref Range Status   Specimen Description BLOOD RIGHT HAND  Final   Special Requests IN PEDIATRIC BOTTLE  4CC  Final   Culture NO GROWTH 1 DAY  Final   Report Status PENDING  Incomplete  Culture, sputum-assessment     Status: None   Collection Time: 10/24/15  1:46 PM  Result Value Ref Range Status   Specimen Description EXPECTORATED SPUTUM  Final   Special Requests NONE  Final  Sputum evaluation   Final    MICROSCOPIC FINDINGS SUGGEST THAT THIS SPECIMEN IS NOT REPRESENTATIVE OF LOWER RESPIRATORY SECRETIONS. PLEASE RECOLLECT. Results Called to: S. LEWIS, RN FOR RECOLLECT AT 1749 ON 10/24/15 BY C. JESSUP, MLT. CANCELLED AND REORDERED.    Report Status 10/24/2015 FINAL  Final     Labs: Basic Metabolic Panel:  Recent Labs Lab 10/23/15 1710 10/23/15 1900 10/24/15 0214 10/25/15 0312 10/26/15 0305  NA 138  --  132* 129* 129*  K 4.7  --  4.5 5.1 4.7  CL 107  --  101 99* 99*  CO2 21*  --  20* 23 22  GLUCOSE 212*  --  653* 395* 284*  BUN 20  --  25* 43* 58*  CREATININE 1.07  --  1.11 1.42* 1.35*  CALCIUM 8.9  --  8.6* 8.4* 8.2*  MG  --  2.3  --   --   --    Liver Function Tests: No results for input(s): AST, ALT, ALKPHOS, BILITOT, PROT, ALBUMIN in the last 168 hours. No results for input(s): LIPASE, AMYLASE in the last 168 hours. No results for input(s): AMMONIA in the last 168 hours. CBC:  Recent Labs Lab 10/23/15 1710 10/25/15 0312 10/26/15 0305  WBC 8.9 13.3* 12.1*  NEUTROABS 5.6  --   --   HGB 13.7 11.9* 11.7*  HCT 44.0 37.9* 37.8*  MCV 80.0 78.8 79.1  PLT 170 183 187   Cardiac Enzymes:  Recent Labs Lab 10/23/15 1831  TROPONINI <0.03   BNP: BNP (last 3 results)  Recent Labs  10/29/14 1215 10/23/15 1831  BNP 402.0* 47.7    ProBNP (last 3 results) No results for input(s): PROBNP in the last 8760  hours.  CBG:  Recent Labs Lab 10/25/15 1638 10/25/15 2034 10/26/15 0106 10/26/15 0428 10/26/15 0918  GLUCAP 311* 350* 341* 251* 301*       Signed:  Cristal Ford  [facility] 10/26/2015, 11:26 AM

## 2015-10-26 NOTE — Care Management Note (Signed)
Case Management Note  Patient Details  Name: Joshua [name] MRN: [id number] Date of Birth: [birth year]  Subjective/Objective:                  Acute respiratory failure with hypoxia/COPD exacerbation Action/Plan: Discharge planning Expected Discharge Date:  10/26/15               Expected Discharge Plan:  Home/Self Care  In-House Referral:     Discharge planning Services  CM Consult  Post Acute Care Choice:  Durable Medical Equipment Choice offered to:  Patient  DME Arranged:  Oxygen DME Agency:  [facility] Arranged:  NA [place] Agency:  NA  Status of Service:  Completed, signed off  If discussed at [place] of Stay Meetings, dates discussed:    Additional Comments: CM met with pt in room to confirm NO HH services.  Pt states [facility] is following and no HH services needed.  CM notified [facility] rep, Jermaine to please authorize home O2.  CM notified [facility] DME rep, Reggie to please deliver the O2 tank to room prior to pt discharge.  Pt states he has all other DME needed and is just waiting of oxygen tank to go home.  No other CM needs were communicated. Dellie Catholic, RN 10/26/2015, 11:29 AM

## 2015-10-26 NOTE — Progress Notes (Signed)
Patient is discharge to home accompanied by patient's spouse  and volunteer staff and  via wheelchair. Discharge instructions given . Patient verbalizes understanding. All personal belongings given. Telemetry box and IV removed prior to discharge and site in good condition.

## 2015-10-28 ENCOUNTER — Telehealth: Payer: Self-pay | Admitting: Family Medicine

## 2015-10-28 NOTE — Telephone Encounter (Addendum)
Transition Care Management Follow-up Telephone Call   Date discharged? 10/26/2015  How have you been since you were released from the hospital? He's doing better, he's on oxygen.    Do you understand why you were in the hospital? yes   Do you understand the discharge instructions? yes   Where were you discharged to? home   Items Reviewed:  Medications reviewed: yes  Allergies reviewed: no  Dietary changes reviewed: yes   Referrals reviewed: no   Functional Questionnaire:   Activities of Daily Living (ADLs):   He states they are independent in the following: none States they require assistance with the following: none   Any transportation issues/concerns?: no   Any patient concerns? no   Confirmed importance and date/time of follow-up visits scheduled yes  Provider Appointment booked with Dr Anitra Lauth, 10/31/2015 @ 10:30  Confirmed with patient if condition begins to worsen call PCP or go to the ER.  Patient was given the office number and encouraged to call back with question or concerns.  : yes

## 2015-10-28 NOTE — Telephone Encounter (Signed)
Transitional care phone call noted.  Agree.  Signed:  Crissie Sickles, MD           10/28/2015

## 2015-10-29 ENCOUNTER — Other Ambulatory Visit: Payer: Self-pay | Admitting: *Deleted

## 2015-10-29 ENCOUNTER — Encounter: Payer: Self-pay | Admitting: *Deleted

## 2015-10-29 LAB — CULTURE, BLOOD (ROUTINE X 2)
Culture: NO GROWTH
Culture: NO GROWTH

## 2015-10-29 NOTE — Patient Outreach (Signed)
Transition of care call completed.  [facility] L[facility] [place] [phone]

## 2015-10-31 ENCOUNTER — Other Ambulatory Visit: Payer: Medicare Other

## 2015-10-31 ENCOUNTER — Ambulatory Visit (INDEPENDENT_AMBULATORY_CARE_PROVIDER_SITE_OTHER): Payer: Medicare Other | Admitting: Family Medicine

## 2015-10-31 ENCOUNTER — Other Ambulatory Visit: Payer: Self-pay | Admitting: Family Medicine

## 2015-10-31 ENCOUNTER — Encounter: Payer: Self-pay | Admitting: Family Medicine

## 2015-10-31 VITALS — BP 106/68 | HR 81 | Temp 98.4°F | Resp 18 | Wt 241.0 lb

## 2015-10-31 DIAGNOSIS — L03116 Cellulitis of left lower limb: Secondary | ICD-10-CM

## 2015-10-31 DIAGNOSIS — E1165 Type 2 diabetes mellitus with hyperglycemia: Secondary | ICD-10-CM

## 2015-10-31 DIAGNOSIS — F17200 Nicotine dependence, unspecified, uncomplicated: Secondary | ICD-10-CM | POA: Diagnosis not present

## 2015-10-31 DIAGNOSIS — D72829 Elevated white blood cell count, unspecified: Secondary | ICD-10-CM

## 2015-10-31 DIAGNOSIS — N179 Acute kidney failure, unspecified: Secondary | ICD-10-CM | POA: Diagnosis not present

## 2015-10-31 DIAGNOSIS — N189 Chronic kidney disease, unspecified: Secondary | ICD-10-CM | POA: Diagnosis not present

## 2015-10-31 DIAGNOSIS — E118 Type 2 diabetes mellitus with unspecified complications: Secondary | ICD-10-CM

## 2015-10-31 DIAGNOSIS — I5041 Acute combined systolic (congestive) and diastolic (congestive) heart failure: Secondary | ICD-10-CM

## 2015-10-31 DIAGNOSIS — J441 Chronic obstructive pulmonary disease with (acute) exacerbation: Secondary | ICD-10-CM

## 2015-10-31 DIAGNOSIS — Z794 Long term (current) use of insulin: Secondary | ICD-10-CM

## 2015-10-31 DIAGNOSIS — J9601 Acute respiratory failure with hypoxia: Secondary | ICD-10-CM

## 2015-10-31 DIAGNOSIS — Z8709 Personal history of other diseases of the respiratory system: Secondary | ICD-10-CM

## 2015-10-31 DIAGNOSIS — IMO0002 Reserved for concepts with insufficient information to code with codable children: Secondary | ICD-10-CM

## 2015-10-31 DIAGNOSIS — Z09 Encounter for follow-up examination after completed treatment for conditions other than malignant neoplasm: Secondary | ICD-10-CM

## 2015-10-31 DIAGNOSIS — Z872 Personal history of diseases of the skin and subcutaneous tissue: Secondary | ICD-10-CM

## 2015-10-31 NOTE — Progress Notes (Signed)
Pre visit review using our clinic review tool, if applicable. No additional management support is needed unless otherwise documented below in the visit note

## 2015-10-31 NOTE — Progress Notes (Signed)
10/31/2015  CC:  Chief Complaint  Patient presents with  . Hospitalization Follow-up    follow up COPD, and cellulitis   Patient is a 75 y.o. Caucasian male who presents for  hospital follow up, specifically [facility] Services face-to-face visit. Dates hospitalized: 10/18-10/21, 2017. Days since d/c from hospital: 5 Patient was discharged from hospital to home. Reason for admission to hospital: acute hypoxic respiratory failure due to COPD exacerbation. Date of interactive (phone) contact with patient and/or caregiver:10/28/15  I have reviewed patient's discharge summary plus pertinent specific notes, labs, and imaging from the hospitalization.    Currently pt states he's feeling much improved.  Breathing fine.  Says he hasn't had to use oxygen at home since 3-4 days ago.   Says oxygen sat at home has been 95-97% at rest.  Not checked at home while ambulating.  He does not wear it hs. No fevers.  Glucoses: 140 this morning, 58 the morning before, 77 morning before that. Currently taking 80 U levemir qhs and novolin R 16-17-18.  He is starting to eat a bedtime snack again. Left inner thigh/groin cellulitis has resolved.  He is currently not smoking--taking chantix nearly a month.  Medication reconciliation was done today and patient has been  taking meds as recommended by discharging hospitalist/specialist.   Has already finished doxycycline course, finishes prednisone taper today.  PMH:  Past Medical History:  Diagnosis Date  . Adenomatous colon polyp 10/16/2011   Repeat 2018  . Arthritis     Hips, R>L & KNEES  . CAD, multiple vessel    3V CAD cath 08/08/13----CABG done shortly after.  . 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 pylori neg, no dysplasia or malignancy.  . Chronic diastolic heart failure ([place]) 2016  . Chronic renal insufficiency, stage II (mild)   . COPD (chronic obstructive pulmonary disease) ([place])    GOLD II.   Spirometry  2004 borderline obstruction; 2015 mod obst  . Diabetic nephropathy (HCC)    Elevated urine microalb/cr 03/2011  . DM type 2 (diabetes mellitus, type 2) (HCC)    Poor control on max oral meds--pt eventually agreed to insulin therap  . DOE (dyspnea on exertion)    COPD + chronic diastolic HF  . Erectile dysfunction    Normal testosterone  . Hearing loss of both ears 2016   Hearing aids  . Hyperlipidemia   . Hyperplastic colon polyp 2001  . Hypertension   . Iron deficiency anemia 2014   03/2012 capsule endoscopy showed 2 AVMs--likely responsible for his IDA--lifetime iron supp recommended + q30moCBCs.  . Macular degeneration, dry    Mild, bilat (Optometrist, [place]ford Knife[place] [employer] [place]  . Obesity   . Other and unspecified angina pectoris   . PAF (paroxysmal atrial fibrillation) ([place] 10/2014   xarelto  . Pericardial effusion with cardiac tamponade 6//29/15   pericardiocentesis was done,  Infectious/inflamm (cytology showed NO MALIGNANT CELLS)  . Pneumonia   . Tobacco dependence in remission    100+ pack-yr hx: quit after CABG    PSH:  Past Surgical History:  Procedure Laterality Date  . APPENDECTOMY  1957  . CARDIAC CATHETERIZATION  08/08/2013  . CATARACT EXTRACTION W/ INTRAOCULAR LENS  IMPLANT, BILATERAL  04/08/2006 & 04/22/2006  . CATARACT EXTRACTION W/ INTRAOCULAR LENS  IMPLANT, BILATERAL Bilateral   . CHOLECYSTECTOMY OPEN  1999  . COLONOSCOPY  10/16/2011   Procedure: COLONOSCOPY;  Surgeon: RInda Castle MD;  [facility]: [facility];  Service: Endoscopy;  Laterality: N/A;  . CORONARY ARTERY BYPASS GRAFT N/A 08/14/2013   Procedure: CORONARY ARTERY BYPASS GRAFTING (CABG);  Surgeon: Melrose Nakayama, MD;  Location: [facility];  Service: Open Heart Surgery;  Laterality: N/A;  Times 4   using left internal mammary artery and endoscopically harvested bilateral saphenous vein  . ESOPHAGOGASTRODUODENOSCOPY  02/25/12   Atrophic gastritis with a few  erosions--capsule endoscopy planned as of 02/25/12 (Dr. Deatra Ina).  [name] HARDWARE REMOVAL Right 08/12/2012   Procedure: HARDWARE REMOVAL;  Surgeon: Mcarthur Rossetti, MD;  Location: [facility];  Service: Orthopedics;  Laterality: Right;  . HIP SURGERY Right 1954   Repair of slipped capital femoral epiphysis.  . INTRAOPERATIVE TRANSESOPHAGEAL ECHOCARDIOGRAM N/A 08/14/2013   Normal LV function. Procedure: INTRAOPERATIVE TRANSESOPHAGEAL ECHOCARDIOGRAM;  Surgeon: Melrose Nakayama, MD;  Location: [facility];  Service: Open Heart Surgery;  Laterality: N/A;  . LEFT HEART CATHETERIZATION WITH CORONARY ANGIOGRAM N/A 08/08/2013   Procedure: LEFT HEART CATHETERIZATION WITH CORONARY ANGIOGRAM;  Surgeon: Jettie Booze, MD;  Location: [facility] C[facility] LAB;  Service: Cardiovascular;  Laterality: N/A;  . PATELLA FRACTURE SURGERY Left ~ 1979   bolt + 3 screws to repair tib plateau fx  . PERICARDIAL TAP N/A 07/01/2013   Procedure: PERICARDIAL TAP;  Surgeon: Jettie Booze, MD;  Location: [facility] CATH LAB;  Service: Cardiovascular;  Laterality: N/A;  . TESTICLE SURGERY  as a child   Undescended testicle brought down into scrotum  . TONSILLECTOMY  1947  . TOTAL HIP ARTHROPLASTY Right 08/12/2012   Procedure: REMOVAL OF OLD PINS RIGHT HIP AND RIGHT TOTAL HIP ARTHROPLASTY ANTERIOR APPROACH;  Surgeon: Mcarthur Rossetti, MD;  [facility];  Service: Orthopedics;  Laterality: Right;  . TRANSTHORACIC ECHOCARDIOGRAM  10/30/14   Mod LVH, EF 60-65%, normal wall motion, mod mitral regurg, mild PAH    MEDS:  Outpatient Medications Prior to Visit  Medication Sig Dispense Refill  . acetaminophen (TYLENOL) 325 MG tablet Take 2 tablets (650 mg total) by mouth every 4 (four) hours as needed for headache or mild pain.    [name] albuterol (PROVENTIL HFA;VENTOLIN HFA) 108 (90 Base) MCG/ACT inhaler Inhale 2 puffs into the lungs every 6 (six) hours as needed for wheezing or shortness of breath. 1 Inhaler 6  . amLODipine (NORVASC) 5 MG tablet  Take 1 tablet (5 mg total) by mouth daily. 30 tablet 11  . aspirin 81 MG tablet Take 81 mg by mouth at bedtime. Reported on 02/26/2015    . budesonide-formoterol (SYMBICORT) 160-4.5 MCG/ACT inhaler Inhale 2 puffs into the lungs 2 (two) times daily. 1 Inhaler 12  . buPROPion (WELLBUTRIN SR) 150 MG 12 hr tablet Take 1 tablet by mouth two  times daily 60 tablet 3  . CHANTIX CONTINUING MONTH PAK 1 MG tablet TAKE ONE TABLET BY MOUTH TWICE DAILY 60 tablet 0  . Cholecalciferol (VITAMIN D-3) 1000 units CAPS Take 1,000 Units by mouth daily. Reported on 02/26/2015    . Chromium Picolinate 1000 MCG TABS Take 1,000 mcg by mouth daily. Reported on 02/26/2015    . citalopram (CELEXA) 40 MG tablet Take 1 tablet (40 mg total) by mouth daily. 90 tablet 3  . diltiazem (CARDIZEM CD) 240 MG 24 hr capsule Take 1 capsule (240 mg total) by mouth daily. 30 capsule 1  . fluticasone (FLONASE) 50 MCG/ACT nasal spray Place 2 sprays into both nostrils daily. 16 g 1  . gabapentin (NEURONTIN) 100 MG capsule 2 caps po tid (Patient  taking differently: Take 200 mg by mouth 3 (three) times daily. 2 caps po tid) 180 capsule 4  . guaiFENesin (MUCINEX) 600 MG 12 hr tablet Take 2 tablets (1,200 mg total) by mouth 2 (two) times daily. 30 tablet 0  . Insulin Detemir (LEVEMIR FLEXTOUCH) 100 UNIT/ML Pen Inject 80 Units into the  skin at bedtime.  Change site of injection with every use. 45 mL 3  . insulin regular (NOVOLIN R,HUMULIN R) 100 units/mL injection Inject 16 units SQ with BF, 17 units SQ with Lunch, and 18 U SQ with Supper (Patient taking differently: Inject 16-18 Units into the skin See admin instructions. Inject 16 units SQ with BF, 17 units SQ with Lunch, and 18 U SQ with Supper) 1 vial 6  . isosorbide mononitrate (IMDUR) 30 MG 24 hr tablet Take 1 tablet by mouth  daily 90 tablet 3  . lovastatin (MEVACOR) 40 MG tablet Take 40 mg by mouth every morning.     . Multiple Vitamin (MULTIVITAMIN WITH MINERALS) TABS Take 1 tablet by mouth  daily.    . nitroGLYCERIN (NITROSTAT) 0.4 MG SL tablet Place 1 tablet (0.4 mg total) under the tongue every 5 (five) minutes as needed for chest pain. 50 tablet 3  . pioglitazone (ACTOS) 45 MG tablet Take 45 mg by mouth daily. Reported on 02/26/2015    . predniSONE (DELTASONE) 20 MG tablet Take 61m (3 tabs) x 2 days, then taper to 428m(2 tabs) x 2 days, then 2032m1 tabs) x 2days, then stop. 12 tablet 0  . PROAIR HFA 108 (90 Base) MCG/ACT inhaler INHALE 2 PUFFS INTO THE LUNGS EVERY 4 HOURS AS NEEDED FOR WHEEZING ORSHORTNESS OF BREATH 8.5 g 6  . rivaroxaban (XARELTO) 20 MG TABS tablet Take 1 tablet (20 mg total) by mouth daily with supper. 90 tablet 4  . tamsulosin (FLOMAX) 0.4 MG CAPS capsule TAKE 1 CAPSULE BY MOUTH EVERY DAY AFTER SUPPER 90 capsule 1  . doxycycline (VIBRA-TABS) 100 MG tablet Take 1 tablet (100 mg total) by mouth every 12 (twelve) hours. 10 tablet 0   No facility-administered medications prior to visit.    EXAM: BP 106/68 (BP Location: Left Arm, Patient Position: Sitting, Cuff Size: Large)   Pulse 81   Temp 98.4 F (36.9 C) (Temporal)   Resp 18   Wt 241 lb (109.3 kg)   SpO2 96%   BMI 33.61 kg/m  Gen: Alert, well appearing.  Patient is oriented to person, place, time, and situation. CV: RRR, no m/r/g.   LUNGS: CTA bilat, nonlabored resps, good aeration in all lung fields. EXT: no clubbing, cyanosis, or edema.  SKIN: left upper/inner thigh with no erythema or tenderness.  Pertinent labs/imaging Lab Results  Component Value Date   WBC 12.1 (H) 10/26/2015   HGB 11.7 (L) 10/26/2015   HCT 37.8 (L) 10/26/2015   MCV 79.1 10/26/2015   PLT 187 10/26/2015     Chemistry      Component Value Date/Time   NA 129 (L) 10/26/2015 0305   K 4.7 10/26/2015 0305   CL 99 (L) 10/26/2015 0305   CO2 22 10/26/2015 0305   BUN 58 (H) 10/26/2015 0305   CREATININE 1.35 (H) 10/26/2015 0305   CREATININE 1.03 11/26/2014 1145      Component Value Date/Time   CALCIUM 8.2 (L) 10/26/2015  0305   ALKPHOS 60 11/30/2014 0441   AST 20 11/30/2014 0441   ALT 21 11/30/2014 0441   BILITOT 0.5 11/30/2014 0441  ASSESSMENT/PLAN:  1) Acute hypoxic resp failure secondary to acute COPD exac: resolved. Finished course of doxy, finishes pred taper today, is off of oxygen for the last 3-4 days.  2) Left thigh cellulitis: resolved.  3) DM; brittle.  I think his low CBGs of late are a combo of not snacking hs and also he's been using lower legs as injection site and has very little fat compared to thighs/abd.  We discussed this today.  For now he'll continue 80 Levemir hs, and novolin R 16 BF, 17 Lunch, and 18 Supper.    4) Leukocytosis: likely from steroids more than infection.  As steroids tapered down, WBC improved. Recheck CBC today.  5) Acute kidney injury: he was likely dehydrated at presentation.  His lasix, chlorthalidone, and ramipril have been held and we'll recheck BMET today.  His bp is staying normal at home and we'll only add these meds back if bp goes up consistently again OR if he starts to appear to be retaining fluid.  6) Tobacco dependence: so far so good with chantix use.  Medical decision making of moderate complexity was utilized today.   An After Visit Summary was printed and given to the patient.  FOLLOW UP:  Keep appt scheduled for 11/21/15  Signed:  Crissie Sickles, MD           10/31/2015

## 2015-11-01 LAB — CBC WITH DIFFERENTIAL/PLATELET
BASOS ABS: 0 {cells}/uL (ref 0–200)
Basophils Relative: 0 %
EOS PCT: 1 %
Eosinophils Absolute: 152 cells/uL (ref 15–500)
HCT: 46.5 % (ref 38.5–50.0)
HEMOGLOBIN: 14.8 g/dL (ref 13.2–17.1)
LYMPHS ABS: 1368 {cells}/uL (ref 850–3900)
Lymphocytes Relative: 9 %
MCH: 25.3 pg — ABNORMAL LOW (ref 27.0–33.0)
MCHC: 31.8 g/dL — ABNORMAL LOW (ref 32.0–36.0)
MCV: 79.6 fL — ABNORMAL LOW (ref 80.0–100.0)
MPV: 9.9 fL (ref 7.5–12.5)
Monocytes Absolute: 1216 cells/uL — ABNORMAL HIGH (ref 200–950)
Monocytes Relative: 8 %
NEUTROS ABS: 12464 {cells}/uL — AB (ref 1500–7800)
Neutrophils Relative %: 82 %
Platelets: 223 10*3/uL (ref 140–400)
RBC: 5.84 MIL/uL — AB (ref 4.20–5.80)
RDW: 17 % — ABNORMAL HIGH (ref 11.0–15.0)
WBC: 15.2 10*3/uL — AB (ref 3.8–10.8)

## 2015-11-01 LAB — BASIC METABOLIC PANEL
BUN: 26 mg/dL — ABNORMAL HIGH (ref 7–25)
CO2: 24 mmol/L (ref 20–31)
Calcium: 9 mg/dL (ref 8.6–10.3)
Chloride: 99 mmol/L (ref 98–110)
Creat: 1.01 mg/dL (ref 0.70–1.18)
GLUCOSE: 186 mg/dL — AB (ref 65–99)
POTASSIUM: 5.2 mmol/L (ref 3.5–5.3)
SODIUM: 135 mmol/L (ref 135–146)

## 2015-11-04 ENCOUNTER — Telehealth: Payer: Self-pay | Admitting: Family Medicine

## 2015-11-04 ENCOUNTER — Encounter: Payer: Self-pay | Admitting: *Deleted

## 2015-11-04 ENCOUNTER — Other Ambulatory Visit: Payer: Self-pay | Admitting: *Deleted

## 2015-11-04 MED ORDER — INSULIN PEN NEEDLE 32G X 5 MM MISC: 12 refills | Status: DC

## 2015-11-04 NOTE — Telephone Encounter (Signed)
Thank you!

## 2015-11-04 NOTE — Telephone Encounter (Signed)
Patient called back asking why we didn't know what kind of DM needles he needed.  He states that the pharmacy doesn't have the correct needles still.  After speaking with pharmacy, pt actually wanted syringes with needles for his insulin.  I did verbal order for insulin needles and syringes with 90 day supply x 4 refills.  Patient aware.

## 2015-11-04 NOTE — Telephone Encounter (Signed)
Patient requesting insulin needles to be called into [facility].  Please advise.

## 2015-11-04 NOTE — Telephone Encounter (Signed)
Agree/Noted.

## 2015-11-04 NOTE — Telephone Encounter (Signed)
Pt contacted office again.  This is a new Rx for pt and I am unsure of what size needles to Rx or quantity.  Pt states he uses insulin TID.  Please advise.

## 2015-11-04 NOTE — Patient Outreach (Addendum)
Transition of care call. Pt reports he has not smoked since 10/27/15. He is taking Chantix. He says he is getting along pretty well. He does report his wt is up a bit but no SOB or edema. He states he uses his symbicort inhaler but not always everyday. He reports he had his follow up visit with Dr. Anitra Lauth last week.  I have advised him HE MUST USE THE SYMBICORT EVERY DAY AM AND PM TO CONTROL HIS COPD. He only needs to use the Albuterol as needed for SOB or wheezing.  I note pt does not have an RX for nebulizer medication. The last PFT in [facility] is from 08/2013.  Dr. Anitra Lauth please consider doing another PFT and consider ordering nebs, thank you!  I have reminded the pt to call me if any problems. I will call him again next week.  Patient was recently discharged from hospital and all medications have been reviewed.    Medication List       Accurate as of 11/04/15 12:01 PM. Always use your most recent med list.          acetaminophen 325 MG tablet Commonly known as:  TYLENOL Take 2 tablets (650 mg total) by mouth every 4 (four) hours as needed for headache or mild pain.   albuterol 108 (90 Base) MCG/ACT inhaler Commonly known as:  PROVENTIL HFA;VENTOLIN HFA Inhale 2 puffs into the lungs every 6 (six) hours as needed for wheezing or shortness of breath.   PROAIR HFA 108 (90 Base) MCG/ACT inhaler Generic drug:  albuterol INHALE 2 PUFFS INTO THE LUNGS EVERY 4 HOURS AS NEEDED FOR WHEEZING ORSHORTNESS OF BREATH   amLODipine 5 MG tablet Commonly known as:  NORVASC Take 1 tablet (5 mg total) by mouth daily.   aspirin 81 MG tablet Take 81 mg by mouth at bedtime. Reported on 02/26/2015   budesonide-formoterol 160-4.5 MCG/ACT inhaler Commonly known as:  SYMBICORT Inhale 2 puffs into the lungs 2 (two) times daily.   buPROPion 150 MG 12 hr tablet Commonly known as:  WELLBUTRIN SR Take 1 tablet by mouth two  times daily   CHANTIX CONTINUING MONTH PAK 1 MG tablet Generic drug:   varenicline TAKE ONE TABLET BY MOUTH TWICE DAILY   Chromium Picolinate 1000 MCG Tabs Take 1,000 mcg by mouth daily. Reported on 02/26/2015   citalopram 40 MG tablet Commonly known as:  CELEXA Take 1 tablet (40 mg total) by mouth daily.   diltiazem 240 MG 24 hr capsule Commonly known as:  CARDIZEM CD Take 1 capsule (240 mg total) by mouth daily.   fluticasone 50 MCG/ACT nasal spray Commonly known as:  FLONASE Place 2 sprays into both nostrils daily.   gabapentin 100 MG capsule Commonly known as:  NEURONTIN 2 caps po tid   guaiFENesin 600 MG 12 hr tablet Commonly known as:  MUCINEX Take 2 tablets (1,200 mg total) by mouth 2 (two) times daily.   Insulin Detemir 100 UNIT/ML Pen Commonly known as:  LEVEMIR FLEXTOUCH Inject 80 Units into the  skin at bedtime.  Change site of injection with every use.   insulin regular 100 units/mL injection Commonly known as:  NOVOLIN R,HUMULIN R Inject 16 units SQ with BF, 17 units SQ with Lunch, and 18 U SQ with Supper   isosorbide mononitrate 30 MG 24 hr tablet Commonly known as:  IMDUR Take 1 tablet by mouth  daily   lovastatin 40 MG tablet Commonly known as:  MEVACOR Take 40 mg by mouth  every morning.   multivitamin with minerals Tabs tablet Take 1 tablet by mouth daily.   nitroGLYCERIN 0.4 MG SL tablet Commonly known as:  NITROSTAT Place 1 tablet (0.4 mg total) under the tongue every 5 (five) minutes as needed for chest pain.   pioglitazone 45 MG tablet Commonly known as:  ACTOS Take 45 mg by mouth daily. Reported on 02/26/2015   predniSONE 20 MG tablet Commonly known as:  DELTASONE Take 42m (3 tabs) x 2 days, then taper to 490m(2 tabs) x 2 days, then 2042m1 tabs) x 2days, then stop.   rivaroxaban 20 MG Tabs tablet Commonly known as:  XARELTO Take 1 tablet (20 mg total) by mouth daily with supper.   tamsulosin 0.4 MG Caps capsule Commonly known as:  FLOMAX TAKE 1 CAPSULE BY MOUTH EVERY DAY AFTER SUPPER   Vitamin D-3  1000 units Caps Take 1,000 Units by mouth daily. Reported on 02/26/2015      Fall Risk  10/29/2015 09/26/2015 08/01/2015 06/06/2015 05/07/2015  Falls in the past year? _0   Number falls in past yr: 1 2 or more 2 or more 2 or more 2 or more  Injury with Fall? _1   Risk Factor Category  High Fall Risk - High Fall Risk High Fall Risk -  Risk for fall due to : History of fall(s);Impaired balance/gait;Medication side effect - History of fall(s);Impaired balance/gait;Impaired mobility;Medication side effect History of fall(s);Impaired balance/gait;Impaired mobility History of fall(s);Impaired balance/gait;Impaired mobility  Follow up Falls evaluation completed;Education provided;Falls prevention discussed Education provided Falls prevention discussed Falls prevention discussed Falls prevention discussed   Dep[facility] 10/29/2015 09/26/2015 08/01/2015 06/06/2015 05/07/2015  Decreased Interest 0 _2 Down, Depressed, Hopeless 0 _3 PHQ - 2 Score 0 _4 Altered sleeping - 1 0 1 1  Tired, decreased energy - _5 Change in appetite - 0 0 0 -  Feeling bad or failure about yourself  - 0 _6 Trouble concentrating - 0 _7 Moving slowly or fidgety/restless - 0 2 1 -  Suicidal thoughts - 0 0 0 0  PHQ-9 Score - _8 Difficult doing work/chores - Somewhat difficult Somewhat difficult Somewhat difficult Somewhat difficult  Some recent data might be hidden   [facility] Problem One   Flowsheet Row Most Recent Value  Care Plan Problem One  Client needs to attend scheudled medical appointments  Care Plan for Problem One  Active  THN Long Term Goal (31-90 days)  Pt will not rehospitalize in 31 days.  THN Long Term Goal Start Date  10/29/15  Interventions for Problem One Long Term Goal  Reinforced smoking cessation, taking control inhaler routinely.  THN CM Short Term Goal #1 (0-30 days)  Pt will abstain from smoking over the next 21 days.   THN CM Short Term Goal #1 Start Date  11/04/15  Interventions for Short Term Goal #1  Applauded pt for his smoking cessatioin. Gave suggestions of substitures when he is tempted, sugarless candy, gum, carrot stick.  THN CM Short Term Goal #2 (0-30 days)  Pt is not using his control inhaler as ordered: bid daily.  THN CM Short Term Goal #2 Start Date  11/04/15  Interventions for Short Term Goal #2  E#xplained the importance of the symbicort to control sxs COPD and need to use  daily bid.      Deloria Lair [facility] [place] [phone]

## 2015-11-04 NOTE — Telephone Encounter (Signed)
Rx sent into pharmacy per Dr Anitra Lauth direction.

## 2015-11-06 ENCOUNTER — Encounter: Payer: Self-pay | Admitting: Licensed Clinical Social Worker

## 2015-11-06 ENCOUNTER — Telehealth: Payer: Self-pay | Admitting: Family Medicine

## 2015-11-06 ENCOUNTER — Other Ambulatory Visit: Payer: Self-pay | Admitting: Licensed Clinical Social Worker

## 2015-11-06 NOTE — Telephone Encounter (Signed)
Per phone conversation w/ Dr Anitra Lauth, pt okay to restart HCTZ 40 mg qd.  Pt needs to contact office is weight is not down by 3lbs Friday and contact office if breathing is worse.  Patient aware of instructions, expressed understanding.  Pt going to beach in the morning.  I told him to carry scale with them to keep track of weight and contact us if weight or breathing is worse.

## 2015-11-06 NOTE — Telephone Encounter (Signed)
Pt's wife called today stating patient is up 8 lbs since his visit 10/26 and wife states that pt's legs are a bit swollen.  She wanted advise?  Can you please advise.

## 2015-11-06 NOTE — Patient Outreach (Signed)
Assessment:  CSW spoke via phone with [name], spouse of client, on 11/06/15. CSW verified identity of Adhvik Canady.  [name] and CSW spoke of client needs. Client has prescribed medications and is taking medications as prescribed. Client has strong support from his wife, Jasen Hartstein. [name] transports client to and from client's medical appointments. Client sees Dr. Anitra Lauth as primary care doctor. CSW informed Daelyn Mozer on 11/06/15 that client had met his care plan goals with [facility] CSW services. Thus, CSW is discharging client from [place] only on 11/06/15. [name] agreed with this plan and said client had no other CSW needs at present. She said client uses relaxation techniques to manage anxiety and stress. Client will continue to receive [facility] nursing support with Deloria Lair, Nurse Practitioner. CSW congratulated Jeneen Rinks on meeting his care plan goals with [facility] CSW services. CSW thanked [place] for phone call with CSW on 11/06/15.    Plan:  CSW is discharging Donata Clay from [facility] CSW services only on 11/06/15 since client has met his care plan goals with [facility] CSW services  CSW to inform Josepha Pigg that [place] discharged client on 11/06/15 from [place] services only.  CSW to fax letter to Dr. Anitra Lauth informing Dr. Anitra Lauth that [place] discharged client on 11/06/15 from [place] services only.  [name].Rondell Frick MSW, LCSW Licensed Clinical Social Worker [facility] Care Management [phone]

## 2015-11-07 ENCOUNTER — Other Ambulatory Visit: Payer: Self-pay | Admitting: *Deleted

## 2015-11-07 NOTE — Patient Outreach (Signed)
Joshua Cobbs, LCSW, discovered that pt had 10# wt gain over last week. I called pt yesterday and advised pt to resume lasix 40 mg 2 tabs today, 40 mg in am and I will recheck. 94% - 62 - 124/50 Wt 150# today was 140 last week. RRR Lungs clear 2+ edema in L foot, trace in R.  I have instructed pt to take furosemide 40 mg bid (early in am and early in the afternoon). Continue this throughout the vacation and I will see pt on Monday afternoon for reassessment and checking a BMET.  Described the pathophysiology between HF and CRF.  Daughter, is a cardiac nurse, Joshua [name], is traveling with him.  Deloria Lair [facility] [phone]

## 2015-11-07 NOTE — Telephone Encounter (Signed)
  Clarification: This note is referring to restarting lasix, not HCTZ.

## 2015-11-08 NOTE — Telephone Encounter (Signed)
Patient called to let us know he has lost 4 pounds.

## 2015-11-09 ENCOUNTER — Encounter: Payer: Self-pay | Admitting: Family Medicine

## 2015-11-09 NOTE — Telephone Encounter (Signed)
Noted. Also noted: Bradd Canary, RN for [facility], put a not in EMR that she restarted pt on lasix 40 mg BID and will be rechecking pt and BMET next week.

## 2015-11-11 ENCOUNTER — Ambulatory Visit: Payer: Self-pay | Admitting: *Deleted

## 2015-11-11 ENCOUNTER — Telehealth: Payer: Self-pay | Admitting: Family Medicine

## 2015-11-11 ENCOUNTER — Other Ambulatory Visit: Payer: Self-pay | Admitting: *Deleted

## 2015-11-11 DIAGNOSIS — I5041 Acute combined systolic (congestive) and diastolic (congestive) heart failure: Secondary | ICD-10-CM

## 2015-11-11 DIAGNOSIS — N189 Chronic kidney disease, unspecified: Secondary | ICD-10-CM

## 2015-11-11 LAB — BASIC METABOLIC PANEL
BUN: 20 mg/dL (ref 6–23)
CHLORIDE: 100 meq/L (ref 96–112)
CO2: 31 meq/L (ref 19–32)
CREATININE: 0.95 mg/dL (ref 0.40–1.50)
Calcium: 8.8 mg/dL (ref 8.4–10.5)
GFR: 81.8 mL/min (ref >60.00)
Glucose, Bld: 367 mg/dL — ABNORMAL HIGH (ref 70–99)
Potassium: 4 mEq/L (ref 3.5–5.1)
Sodium: 140 mEq/L (ref 135–145)

## 2015-11-11 NOTE — Telephone Encounter (Signed)
All normal except glucose 367.   I sent a result note to Mickel Baas to call the patient with some insulin dosing changes. Will you call Joshua [name] and let her know results?-thx

## 2015-11-11 NOTE — Telephone Encounter (Signed)
Bradd Canary, RN brought a BMET blood sample.  She would like called with results.  FYI.

## 2015-11-12 ENCOUNTER — Encounter: Payer: Self-pay | Admitting: *Deleted

## 2015-11-12 NOTE — Patient Outreach (Signed)
[place] [facility]) Care Management  11/12/2015  Joshua [name] 05/26/1940 [id number]   Follow up acute visit. Pt has returned from his trip to the beach without incident. He reports he has been taking furosemide 40 mg bid as instructed last week. He has lost 7# from 252 to 245. He has no SOB or wheezing, no edema.  BP (!) 160/70 (BP Location: Right Arm, Patient Position: Sitting, Cuff Size: Normal)   Pulse 62   Resp 18   Wt 245 lb (111.1 kg)   SpO2 94%   BMI 34.17 kg/m   CHF exacerbation resolved after resuming furosemide  Pt to reduce furosemide to 40 mg daily only now and report wt gain 3-5 pounds to NP. [name] BMET and delivered to Dr. Idelle Leech office. I will call pt next week. Note( Care Plan addresses problem that sent pt to the hospital in October.)  [facility] CM Care Plan Problem One   Flowsheet Row Most Recent Value  Care Plan Problem One  (P) COPD with exacerbation  Role Documenting the Problem One  (P) Care Management Coordinator  Care Plan for Problem One  (P) Active  THN Long Term Goal (31-90 days)  (P) Pt wil follow his COPD action plan over the next 31 days.  THN Long Term Goal Start Date  (P) 11/11/15  Interventions for Problem One Long Term Goal  (P) Reviewed Action plan with pt. Assured pt has all meds and O2 and reinforced medication compliance.  THN CM Short Term Goal #1 (0-30 days)  (P) Pt will attend follow up pulmonology appt.  THN CM Short Term Goal #1 Start Date  (P) 11/11/15  THN CM Short Term Goal #2 (0-30 days)  (P) Pt will take respiratory meds as ordered over the next 31 days.  THN CM Short Term Goal #2 Start Date  (P) 11/11/15  Interventions for Short Term Goal #2  (P) Reviewed and reinforced importance of taking meds routinely.  THN CM Short Term Goal #3 (0-30 days)  (P) Pt will call NP or MD for any signs of respiratory worsening.  THN CM Short Term Goal #3 Start Date  (P) 11/11/15  Interventions for Short Tern Goal #3  (P) Reinforced  availability of NP or 24 hour nurse line for advice.       Deloria Lair [facility] [place] [phone]

## 2015-11-12 NOTE — Telephone Encounter (Signed)
Left detailed message for Joshua [name] regarding results.

## 2015-11-18 ENCOUNTER — Other Ambulatory Visit: Payer: Self-pay | Admitting: *Deleted

## 2015-11-18 NOTE — Patient Outreach (Signed)
Transition of care call. Pt reports he is doing well. His weight is 246.2 (gain of 1#) since we reduced the furosemide to 40 mg daily only. He denies SOB or edema. He says he is breathing fine on room air, has not needed O2. His pulse Ox was 94% this am. He has taken his breathing meds as ordered.  I advised him to continue the furosemide 40 mg daily, however, if his weight reaches 250, or if he gets SOB, has increasing edema, he needs to call me. He acknowledges this and says he will call. I explained that we will incorporate an "an as needed extra dose of furosemide 40 mg if this happens.  I will call him again next Monday.  THN CM Care Plan Problem One   Flowsheet Row Most Recent Value  Care Plan Problem One  COPD with exacerbation  Role Documenting the Problem One  Care Management Coordinator  Care Plan for Problem One  Active  THN Long Term Goal (31-90 days)  Pt wil follow his COPD action plan over the next 31 days.  THN Long Term Goal Start Date  11/11/15  Interventions for Problem One Long Term Goal  Reinforced taking routine meds daily and using prn if necessary and to call if any problems.  THN CM Short Term Goal #1 (0-30 days)  Pt will attend follow up MD appt. on 11/21/15  THN CM Short Term Goal #1 Start Date  11/11/15  THN CM Short Term Goal #2 (0-30 days)  Pt will take respiratory meds as ordered over the next 31 days.  THN CM Short Term Goal #2 Start Date  11/11/15  Interventions for Short Term Goal #2  Praised pt for following his medication regimen.  THN CM Short Term Goal #3 (0-30 days)  Pt will call NP or MD for any signs of respiratory worsening.  THN CM Short Term Goal #3 Start Date  11/11/15  Interventions for Short Tern Goal #3  Instructed pt to call if wt reaches 250, if has SOB or edmea.     Deloria Lair [facility] [place] [phone]

## 2015-11-21 ENCOUNTER — Ambulatory Visit (INDEPENDENT_AMBULATORY_CARE_PROVIDER_SITE_OTHER): Payer: Medicare Other | Admitting: Family Medicine

## 2015-11-21 ENCOUNTER — Encounter: Payer: Self-pay | Admitting: Family Medicine

## 2015-11-21 VITALS — BP 144/84 | HR 90 | Temp 97.7°F | Resp 18 | Wt 249.1 lb

## 2015-11-21 DIAGNOSIS — E118 Type 2 diabetes mellitus with unspecified complications: Secondary | ICD-10-CM

## 2015-11-21 DIAGNOSIS — E7800 Pure hypercholesterolemia, unspecified: Secondary | ICD-10-CM | POA: Diagnosis not present

## 2015-11-21 DIAGNOSIS — I10 Essential (primary) hypertension: Secondary | ICD-10-CM

## 2015-11-21 DIAGNOSIS — I5032 Chronic diastolic (congestive) heart failure: Secondary | ICD-10-CM | POA: Diagnosis not present

## 2015-11-21 DIAGNOSIS — Z794 Long term (current) use of insulin: Secondary | ICD-10-CM | POA: Diagnosis not present

## 2015-11-21 DIAGNOSIS — F17200 Nicotine dependence, unspecified, uncomplicated: Secondary | ICD-10-CM

## 2015-11-21 LAB — LIPID PANEL
CHOL/HDL RATIO: 5
CHOLESTEROL: 268 mg/dL — AB (ref 0–200)
HDL: 54.6 mg/dL (ref >39.00)
NONHDL: 213.12
Triglycerides: 259 mg/dL — ABNORMAL HIGH (ref 0.0–149.0)
VLDL: 51.8 mg/dL — AB (ref 0.0–40.0)

## 2015-11-21 LAB — HEMOGLOBIN A1C: Hgb A1c MFr Bld: 10.4 % — ABNORMAL HIGH (ref 4.6–6.5)

## 2015-11-21 LAB — BASIC METABOLIC PANEL
BUN: 15 mg/dL (ref 6–23)
CALCIUM: 9.2 mg/dL (ref 8.4–10.5)
CO2: 26 mEq/L (ref 19–32)
Chloride: 99 mEq/L (ref 96–112)
Creatinine, Ser: 0.83 mg/dL (ref 0.40–1.50)
GFR: 95.59 mL/min (ref >60.00)
Glucose, Bld: 207 mg/dL — ABNORMAL HIGH (ref 70–99)
Potassium: 4.2 mEq/L (ref 3.5–5.1)
SODIUM: 136 meq/L (ref 135–145)

## 2015-11-21 LAB — LDL CHOLESTEROL, DIRECT: LDL DIRECT: 187 mg/dL

## 2015-11-21 NOTE — Progress Notes (Signed)
OFFICE VISIT   11/21/2015  CC:  Chief Complaint  Patient presents with  . Follow-up    Diabetes   HPI:    Patient is a 75 y.o. Caucasian male who presents for 3 mo f/u DM 2, HTN, HLD, and tob dependence--he started chantix 4 mo ago.  He sees cardiologist for chronic diastolic CHF, CAD, and PAF/chronic anticoagulation.  He sees pulm for COPD.  In the last 10d or so he has been working with [place], Bradd Canary, to get fluid overload reduced by increasing lasix dosing temporarily to 14m bid.  He lost wt with this and has now resumed 449mqd lasix dosing. Monitoring of lytes/cr has shown these to be stable during this time, but glucose was 367 on BMET so I gave instructions to titrate levemir to 82 U qhs and novolin R to 17--18--19.  Wt 11/18/15 documented at home was 246.2 lbs--today he is 249 lbs here. BP at home consistently <140/90.    He is not smoking at all now, none since 10/23/15.  Used chantix.  DM: "up and down according to what I eat".  This AM was 177 fasting.  Yesterday was 98 fasting. Only one low glucose since I last saw him.  COPD: breathing feels at baseline, no cough/wheeze lately.  Compliant with inhalers. No fevers, no URI sx's.  Past Medical History:  Diagnosis Date  . Adenomatous colon polyp 10/16/2011   Repeat 2018  . Arthritis     Hips, R>L & KNEES  . CAD, multiple vessel    3V CAD cath 08/08/13----CABG done shortly after.  . 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 pylori neg, no dysplasia or malignancy.  . Chronic diastolic heart failure (H[place]2016  . Chronic renal insufficiency, stage II (mild)   . COPD (chronic obstructive pulmonary disease) ([facility]   GOLD II.  Spirometry  2004 borderline obstruction; 2015 mod obst; NEEDS REPEAT PFTs + albut nebs ordered (11/2015)  . Diabetic nephropathy (HCC)    Elevated urine microalb/cr 03/2011  . DM type 2 (diabetes mellitus, type 2) (HCC)    Poor control on max oral  meds--pt eventually agreed to insulin therap  . DOE (dyspnea on exertion)    COPD + chronic diastolic HF  . Erectile dysfunction    Normal testosterone  . Hearing loss of both ears 2016   Hearing aids  . Hyperlipidemia   . Hyperplastic colon polyp 2001  . Hypertension   . Iron deficiency anemia 2014   03/2012 capsule endoscopy showed 2 AVMs--likely responsible for his IDA--lifetime iron supp recommended + q6m35moCs.  . Macular degeneration, dry    Mild, bilat (Optometrist, [place] [employer] [place]. Obesity   . Other and unspecified angina pectoris   . PAF (paroxysmal atrial fibrillation) (HCCEldred0/2016   xarelto  . Pericardial effusion with cardiac tamponade 6//29/15   pericardiocentesis was done,  Infectious/inflamm (cytology showed NO MALIGNANT CELLS)  . Pneumonia   . Tobacco dependence in remission    100+ pack-yr hx: quit after CABG    Past Surgical History:  Procedure Laterality Date  . APPENDECTOMY  1957  . CARDIAC CATHETERIZATION  08/08/2013  . CATARACT EXTRACTION W/ INTRAOCULAR LENS  IMPLANT, BILATERAL  04/08/2006 & 04/22/2006  . CATARACT EXTRACTION W/ INTRAOCULAR LENS  IMPLANT, BILATERAL Bilateral   . CHOLECYSTECTOMY OPEN  1999  . COLONOSCOPY  10/16/2011   Procedure: COLONOSCOPY;  Surgeon: RobInda CastleD;  Location: [facility];  Service: Endoscopy;  Laterality: N/A;  . CORONARY ARTERY BYPASS GRAFT N/A 08/14/2013   Procedure: CORONARY ARTERY BYPASS GRAFTING (CABG);  Surgeon: Melrose Nakayama, MD;  Location: [place];  Service: Open Heart Surgery;  Laterality: N/A;  Times 4   using left internal mammary artery and endoscopically harvested bilateral saphenous vein  . ESOPHAGOGASTRODUODENOSCOPY  02/25/12   Atrophic gastritis with a few erosions--capsule endoscopy planned as of 02/25/12 (Dr. Deatra Ina).  [name] HARDWARE REMOVAL Right 08/12/2012   Procedure: HARDWARE REMOVAL;  Surgeon: Mcarthur Rossetti, MD;  Location: [facility];  Service:  Orthopedics;  Laterality: Right;  . HIP SURGERY Right 1954   Repair of slipped capital femoral epiphysis.  . INTRAOPERATIVE TRANSESOPHAGEAL ECHOCARDIOGRAM N/A 08/14/2013   Normal LV function. Procedure: INTRAOPERATIVE TRANSESOPHAGEAL ECHOCARDIOGRAM;  Surgeon: Melrose Nakayama, MD;  Location: [place];  Service: Open Heart Surgery;  Laterality: N/A;  . LEFT HEART CATHETERIZATION WITH CORONARY ANGIOGRAM N/A 08/08/2013   Procedure: LEFT HEART CATHETERIZATION WITH CORONARY ANGIOGRAM;  Surgeon: Jettie Booze, MD;  Location: [facility] CATH LAB;  Service: Cardiovascular;  Laterality: N/A;  . PATELLA FRACTURE SURGERY Left ~ 1979   bolt + 3 screws to repair tib plateau fx  . PERICARDIAL TAP N/A 07/01/2013   Procedure: PERICARDIAL TAP;  Surgeon: Jettie Booze, MD;  Location: [facility] CATH LAB;  Service: Cardiovascular;  Laterality: N/A;  . TESTICLE SURGERY  as a child   Undescended testicle brought down into scrotum  . TONSILLECTOMY  1947  . TOTAL HIP ARTHROPLASTY Right 08/12/2012   Procedure: REMOVAL OF OLD PINS RIGHT HIP AND RIGHT TOTAL HIP ARTHROPLASTY ANTERIOR APPROACH;  Surgeon: Mcarthur Rossetti, MD;  Location: [facility];  Service: Orthopedics;  Laterality: Right;  . TRANSTHORACIC ECHOCARDIOGRAM  10/30/14   Mod LVH, EF 60-65%, normal wall motion, mod mitral regurg, mild PAH    Outpatient Medications Prior to Visit  Medication Sig Dispense Refill  . acetaminophen (TYLENOL) 325 MG tablet Take 2 tablets (650 mg total) by mouth every 4 (four) hours as needed for headache or mild pain.    [name] albuterol (PROVENTIL HFA;VENTOLIN HFA) 108 (90 Base) MCG/ACT inhaler Inhale 2 puffs into the lungs every 6 (six) hours as needed for wheezing or shortness of breath. 1 Inhaler 6  . amLODipine (NORVASC) 5 MG tablet Take 1 tablet (5 mg total) by mouth daily. 30 tablet 11  . aspirin 81 MG tablet Take 81 mg by mouth at bedtime. Reported on 02/26/2015    . budesonide-formoterol (SYMBICORT) 160-4.5 MCG/ACT inhaler Inhale 2  puffs into the lungs 2 (two) times daily. 1 Inhaler 12  . buPROPion (WELLBUTRIN SR) 150 MG 12 hr tablet Take 1 tablet by mouth two  times daily 60 tablet 3  . CHANTIX CONTINUING MONTH PAK 1 MG tablet TAKE ONE TABLET BY MOUTH TWICE DAILY 60 tablet 0  . Cholecalciferol (VITAMIN D-3) 1000 units CAPS Take 1,000 Units by mouth daily. Reported on 02/26/2015    . Chromium Picolinate 1000 MCG TABS Take 1,000 mcg by mouth daily. Reported on 02/26/2015    . citalopram (CELEXA) 40 MG tablet Take 1 tablet (40 mg total) by mouth daily. 90 tablet 3  . diltiazem (CARDIZEM CD) 240 MG 24 hr capsule Take 1 capsule (240 mg total) by mouth daily. 30 capsule 1  . fluticasone (FLONASE) 50 MCG/ACT nasal spray Place 2 sprays into both nostrils daily. 16 g 1  . furosemide (LASIX) 40 MG tablet Take 40 mg by mouth daily.    [name]  gabapentin (NEURONTIN) 100 MG capsule 2 caps po tid (Patient taking differently: Take 200 mg by mouth 3 (three) times daily. 2 caps po tid) 180 capsule 4  . guaiFENesin (MUCINEX) 600 MG 12 hr tablet Take 2 tablets (1,200 mg total) by mouth 2 (two) times daily. 30 tablet 0  . Insulin Detemir (LEVEMIR FLEXTOUCH) 100 UNIT/ML Pen Inject 80 Units into the  skin at bedtime.  Change site of injection with every use. 45 mL 3  . Insulin Pen Needle 32G X 5 MM MISC Use to inject insulin TID as directed by physician. 100 each 12  . insulin regular (NOVOLIN R,HUMULIN R) 100 units/mL injection Inject 16 units SQ with BF, 17 units SQ with Lunch, and 18 U SQ with Supper (Patient taking differently: Inject 16-18 Units into the skin See admin instructions. Inject 16 units SQ with BF, 17 units SQ with Lunch, and 18 U SQ with Supper) 1 vial 6  . isosorbide mononitrate (IMDUR) 30 MG 24 hr tablet Take 1 tablet by mouth  daily 90 tablet 3  . lovastatin (MEVACOR) 40 MG tablet Take 40 mg by mouth every morning.     . Multiple Vitamin (MULTIVITAMIN WITH MINERALS) TABS Take 1 tablet by mouth daily.    . nitroGLYCERIN (NITROSTAT) 0.4  MG SL tablet Place 1 tablet (0.4 mg total) under the tongue every 5 (five) minutes as needed for chest pain. 50 tablet 3  . pioglitazone (ACTOS) 45 MG tablet Take 45 mg by mouth daily. Reported on 02/26/2015    . predniSONE (DELTASONE) 20 MG tablet Take 64m (3 tabs) x 2 days, then taper to 46m(2 tabs) x 2 days, then 208m1 tabs) x 2days, then stop. 12 tablet 0  . PROAIR HFA 108 (90 Base) MCG/ACT inhaler INHALE 2 PUFFS INTO THE LUNGS EVERY 4 HOURS AS NEEDED FOR WHEEZING ORSHORTNESS OF BREATH 8.5 g 6  . rivaroxaban (XARELTO) 20 MG TABS tablet Take 1 tablet (20 mg total) by mouth daily with supper. 90 tablet 4  . tamsulosin (FLOMAX) 0.4 MG CAPS capsule TAKE 1 CAPSULE BY MOUTH EVERY DAY AFTER SUPPER 90 capsule 1   No facility-administered medications prior to visit.     Allergies  Allergen Reactions  . Morphine And Related Other (See Comments)    Drenched with perspiration  . Demerol [Meperidine] Nausea Only  . Starlix [Nateglinide] Other (See Comments)    gassy    ROS As per HPI  PE: Blood pressure (!) 144/84, pulse 90, temperature 97.7 F (36.5 C), temperature source Temporal, resp. rate 18, weight 249 lb 1.9 oz (113 kg), SpO2 96 %. Gen: Alert, well appearing.  Patient is oriented to person, place, time, and situation. CV: RRR, no m/r/g.   LUNGS: CTA bilat, nonlabored resps, good aeration in all lung fields. EXT: no clubbing or cyanosis.  1+ pitting edema L>R.  LABS:  Lab Results  Component Value Date   TSH 0.918 07/01/2013   Lab Results  Component Value Date   WBC 15.2 (H) 10/31/2015   HGB 14.8 10/31/2015   HCT 46.5 10/31/2015   MCV 79.6 (L) 10/31/2015   PLT 223 10/31/2015   Lab Results  Component Value Date   CREATININE 0.95 11/11/2015   BUN 20 11/11/2015   NA 140 11/11/2015   K 4.0 11/11/2015   CL 100 11/11/2015   CO2 31 11/11/2015   Lab Results  Component Value Date   ALT 21 11/30/2014   AST 20 11/30/2014   ALKPHOS 60  11/30/2014   BILITOT 0.5 11/30/2014    Lab Results  Component Value Date   CHOL 161 04/17/2014   Lab Results  Component Value Date   HDL 47.60 04/17/2014   Lab Results  Component Value Date   LDLCALC 63 07/04/2013   Lab Results  Component Value Date   TRIG 218.0 (H) 04/17/2014   Lab Results  Component Value Date   CHOLHDL 3 04/17/2014   Lab Results  Component Value Date   HGBA1C 9.2 (H) 08/21/2015   IMPRESSION AND PLAN:  1) DM 2; erratic/brittle, hx of poor control. He struggles to eat consistent diet, making it hard to adjust insulins w/out causing some hypoglycemia. Recheck A1c today.  2) HTN; The current medical regimen is effective;  continue present plan and medications. We'll continue to leave him off his chlorthalidone and ramipril for now (hx of ARF on these meds when concommitantly diuresed with lasix).  Continue home bp monitoring.  3) HLD: he is fasting today and due for FLP monitoring.  Tolerating statin.  4) Tobacco dependence: successful cessation x 1 mo s/p chantix!  5) Diastolic CHF: recent fluid overload responded well to doubling-up of lasix short term. He is now back on lasix 44m qd, with plan for prn 460mlasix if wt goes up or he has and SOB/DOE. Monitoring BMET today.  An After Visit Summary was printed and given to the patient.  FOLLOW UP: Return in about 3 months (around 02/21/2016) for routine chronic illness f/u.  Signed:  PhCrissie SicklesMD           11/21/2015

## 2015-11-21 NOTE — Progress Notes (Signed)
Pre visit review using our clinic review tool, if applicable. No additional management support is needed unless otherwise documented below in the visit note

## 2015-11-22 ENCOUNTER — Other Ambulatory Visit: Payer: Self-pay | Admitting: Family Medicine

## 2015-11-22 DIAGNOSIS — Z794 Long term (current) use of insulin: Principal | ICD-10-CM

## 2015-11-22 DIAGNOSIS — IMO0002 Reserved for concepts with insufficient information to code with codable children: Secondary | ICD-10-CM

## 2015-11-22 DIAGNOSIS — E1165 Type 2 diabetes mellitus with hyperglycemia: Secondary | ICD-10-CM

## 2015-11-22 DIAGNOSIS — E118 Type 2 diabetes mellitus with unspecified complications: Secondary | ICD-10-CM

## 2015-11-22 MED ORDER — ATORVASTATIN CALCIUM 40 MG PO TABS: 40.0000 mg | ORAL_TABLET | Freq: Every day | ORAL | 3 refills | Status: DC

## 2015-11-24 NOTE — Progress Notes (Signed)
Noted.

## 2015-11-25 ENCOUNTER — Other Ambulatory Visit: Payer: Self-pay | Admitting: *Deleted

## 2015-11-26 ENCOUNTER — Encounter: Payer: Self-pay | Admitting: Family Medicine

## 2015-11-26 ENCOUNTER — Ambulatory Visit (INDEPENDENT_AMBULATORY_CARE_PROVIDER_SITE_OTHER): Payer: Medicare Other | Admitting: Family Medicine

## 2015-11-26 VITALS — BP 136/64 | HR 92 | Temp 98.1°F | Resp 16 | Wt 252.5 lb

## 2015-11-26 DIAGNOSIS — J449 Chronic obstructive pulmonary disease, unspecified: Secondary | ICD-10-CM | POA: Diagnosis not present

## 2015-11-26 DIAGNOSIS — E118 Type 2 diabetes mellitus with unspecified complications: Secondary | ICD-10-CM

## 2015-11-26 DIAGNOSIS — I5032 Chronic diastolic (congestive) heart failure: Secondary | ICD-10-CM

## 2015-11-26 DIAGNOSIS — I10 Essential (primary) hypertension: Secondary | ICD-10-CM | POA: Diagnosis not present

## 2015-11-26 NOTE — Patient Outreach (Signed)
Acute problem home visit:  S: Joshua [name] called me this morning reporting Joshua [name] has a large blister on a toe and the toe appears discolored. I advised I would visit them on my way home.  O: Joshua [name] has a cm sized water blister on his second R toe, at the second metatarsal joint.. There is evidence of some friction to the first metatarsal joint, a healing area. 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 I cannot appreciate a pedal pulse or a posterior tibial pulse. His foot is warm and the toe does blanche. It is slightly pink but not cyanotic.   Pt demonstrated to me the 3 pairs of shoes he wears. The slippers he wears all the time have a worn spot inside where the friction is coming from. Also one other pair of shoes has the same problem. The last pair he has, has a larger toe box and it feels like this shoe may accommodate his foot better.  A: Diabetic foot wound     Poorly controlled NIDDM  P: We had a long discussion about foot care. I have advised him to obtain some sandals that have the toes out and a large strap across the instep to wear. I have encouraged him to wear socks, which he never does. I have advised him to NOT BREAK the the blister if he can help it as this is self protective. However, if it does break, I have instructed him to leave the skin in place, wash with antibacterial soap and water, dry and apply some antibiotic ointment and a bandage. Do this daily. Call me if the condition worsens.  He also reports his glucose control has a been poor and he is being sent to an endocrinologist.  I would like him to get a pair of diabetic shoes. I will ask Dr. Anitra Lauth to give him an RX and recommend a vendor for Joshua [name].  He may need to have peripheral vascular studies done. I will also start providing diabetic diet counseling. I will see him in a week.  [facility]N CM Care Plan Problem One   Flowsheet Row Most Recent Value  Care Plan Problem One  COPD with  exacerbation  Role Documenting the Problem One  Care Management Coordinator  Care Plan for Problem One  Active  THN Long Term Goal (31-90 days)  Pt wil follow his COPD action plan over the next 31 days.  THN Long Term Goal Start Date  11/11/15  Interventions for Problem One Long Term Goal  Reinforced taking routine meds daily and using prn if necessary and to call if any problems.  THN CM Short Term Goal #1 (0-30 days)  Pt will attend follow up MD appt. on 11/21/15  THN CM Short Term Goal #1 Start Date  11/11/15  THN CM Short Term Goal #1 Met Date  11/26/15  THN CM Short Term Goal #2 (0-30 days)  Pt will take respiratory meds as ordered over the next 31 days.  THN CM Short Term Goal #2 Start Date  11/11/15  Interventions for Short Term Goal #2  Praised pt for following his medication regimen.  THN CM Short Term Goal #3 (0-30 days)  Pt will call NP or MD for any signs of respiratory worsening.  THN CM Short Term Goal #3 Start Date  11/11/15  Interventions for Short Tern Goal #3  See new problem: Diabetes.    [facility] CM Care Plan Problem Two   Flowsheet  Row Most Recent Value  Care Plan Problem Two  Diabetes not well controlled and with foot wound.  Role Documenting the Problem Two  Care Management [place] for Problem Two  Active  Interventions for Problem Two Long Term Goal   Counseled pt about following diet, cutting down on carbs, avoiding complications due to hyperglycemia.  THN Long Term Goal (31-90) days  Pt glucose levels will be reduced to under 200 mg/Dl 75% of the time at the end of 30 days.  THN Long Term Goal Start Date  11/26/15  THN CM Short Term Goal #1 (0-30 days)  Pt to write down everything he eats over the next week.  THN CM Short Term Goal #1 Start Date  11/26/15  Interventions for Short Term Goal #2   Request pt to write down everything he eats and together with his wife we will assess and discuss areas for improvoement next week.     Joshua [name]  [facility] [place] [phone]

## 2015-11-26 NOTE — Progress Notes (Signed)
Pre visit review using our clinic review tool, if applicable. No additional management support is needed unless otherwise documented below in the visit note.

## 2015-11-26 NOTE — Progress Notes (Addendum)
OFFICE VISIT  11/26/2015   CC:  Chief Complaint  Patient presents with  . Diabetes    need for DM shoes and socks   HPI:    Patient is a 75 y.o. Caucasian male who presents for "need for DM shoes and socks". Recent blister on 2nd toe of right foot noted by Bradd Canary, [place]. He is due for diab foot exam.  The blister popped and he is putting ointment on it and covering it with band-aid. He denies any pain, tingling, or numbness in feet. He states bp at home this morning was 112/70s, HR 90. Says wt at home this morning was 248 lbs on his scale.  Says breathing feels stable, as does his LE swelling.  Past Medical History:  Diagnosis Date  . Adenomatous colon polyp 10/16/2011   Repeat 2018  . Arthritis     Hips, R>L & KNEES  . CAD, multiple vessel    3V CAD cath 08/08/13----CABG done shortly after.  . 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 pylori neg, no dysplasia or malignancy.  . Chronic diastolic heart failure ([place]) 2016  . Chronic renal insufficiency, stage II (mild)   . COPD (chronic obstructive pulmonary disease) ([place])    GOLD II.  Spirometry  2004 borderline obstruction; 2015 mod obst; NEEDS REPEAT PFTs + albut nebs ordered (11/2015)  . Diabetic nephropathy (HCC)    Elevated urine microalb/cr 03/2011  . DM type 2 (diabetes mellitus, type 2) (HCC)    Poor control on max oral meds--pt eventually agreed to insulin therap  . DOE (dyspnea on exertion)    COPD + chronic diastolic HF  . Erectile dysfunction    Normal testosterone  . Hearing loss of both ears 2016   Hearing aids  . Hyperlipidemia   . Hyperplastic colon polyp 2001  . Hypertension   . Iron deficiency anemia 2014   03/2012 capsule endoscopy showed 2 AVMs--likely responsible for his IDA--lifetime iron supp recommended + q4moCBCs.  . Macular degeneration, dry    Mild, bilat (Optometrist, [place]ife[place] [employer]  . Obesity   . Other and  unspecified angina pectoris   . PAF (paroxysmal atrial fibrillation) ([place] 10/2014   xarelto  . Pericardial effusion with cardiac tamponade 6//29/15   pericardiocentesis was done,  Infectious/inflamm (cytology showed NO MALIGNANT CELLS)  . Pneumonia   . Tobacco dependence in remission    100+ pack-yr hx: quit after CABG    Past Surgical History:  Procedure Laterality Date  . APPENDECTOMY  1957  . CARDIAC CATHETERIZATION  08/08/2013  . CATARACT EXTRACTION W/ INTRAOCULAR LENS  IMPLANT, BILATERAL  04/08/2006 & 04/22/2006  . CATARACT EXTRACTION W/ INTRAOCULAR LENS  IMPLANT, BILATERAL Bilateral   . CHOLECYSTECTOMY OPEN  1999  . COLONOSCOPY  10/16/2011   Procedure: COLONOSCOPY;  Surgeon: RInda Castle MD;  Location: [facility];  Service: Endoscopy;  Laterality: N/A;  . CORONARY ARTERY BYPASS GRAFT N/A 08/14/2013   Procedure: CORONARY ARTERY BYPASS GRAFTING (CABG);  Surgeon: SMelrose Nakayama MD;  [place]  Service: Open Heart Surgery;  Laterality: N/A;  Times 4   using left internal mammary artery and endoscopically harvested bilateral saphenous vein  . ESOPHAGOGASTRODUODENOSCOPY  02/25/12   Atrophic gastritis with a few erosions--capsule endoscopy planned as of 02/25/12 (Dr. KDeatra Ina.  .Marland KitchenHARDWARE REMOVAL Right 08/12/2012   Procedure: HARDWARE REMOVAL;  Surgeon: CMcarthur Rossetti MD;  Location: [facility];  Service:  Orthopedics;  Laterality: Right;  . HIP SURGERY Right 1954   Repair of slipped capital femoral epiphysis.  . INTRAOPERATIVE TRANSESOPHAGEAL ECHOCARDIOGRAM N/A 08/14/2013   Normal LV function. Procedure: INTRAOPERATIVE TRANSESOPHAGEAL ECHOCARDIOGRAM;  Surgeon: Melrose Nakayama, MD;  Location: [place];  Service: Open Heart Surgery;  Laterality: N/A;  . LEFT HEART CATHETERIZATION WITH CORONARY ANGIOGRAM N/A 08/08/2013   Procedure: LEFT HEART CATHETERIZATION WITH CORONARY ANGIOGRAM;  Surgeon: Jettie Booze, MD;  [facility];  Service: Cardiovascular;   Laterality: N/A;  . PATELLA FRACTURE SURGERY Left ~ 1979   bolt + 3 screws to repair tib plateau fx  . PERICARDIAL TAP N/A 07/01/2013   Procedure: PERICARDIAL TAP;  Surgeon: Jettie Booze, MD;  Location: [facility] CATH LAB;  Service: Cardiovascular;  Laterality: N/A;  . TESTICLE SURGERY  as a child   Undescended testicle brought down into scrotum  . TONSILLECTOMY  1947  . TOTAL HIP ARTHROPLASTY Right 08/12/2012   Procedure: REMOVAL OF OLD PINS RIGHT HIP AND RIGHT TOTAL HIP ARTHROPLASTY ANTERIOR APPROACH;  Surgeon: Mcarthur Rossetti, MD;  Location: [facility];  Service: Orthopedics;  Laterality: Right;  . TRANSTHORACIC ECHOCARDIOGRAM  10/30/14   Mod LVH, EF 60-65%, normal wall motion, mod mitral regurg, mild PAH    Outpatient Medications Prior to Visit  Medication Sig Dispense Refill  . acetaminophen (TYLENOL) 325 MG tablet Take 2 tablets (650 mg total) by mouth every 4 (four) hours as needed for headache or mild pain.    [name] albuterol (PROVENTIL HFA;VENTOLIN HFA) 108 (90 Base) MCG/ACT inhaler Inhale 2 puffs into the lungs every 6 (six) hours as needed for wheezing or shortness of breath. 1 Inhaler 6  . amLODipine (NORVASC) 5 MG tablet Take 1 tablet (5 mg total) by mouth daily. 30 tablet 11  . aspirin 81 MG tablet Take 81 mg by mouth at bedtime. Reported on 02/26/2015    . atorvastatin (LIPITOR) 40 MG tablet Take 1 tablet (40 mg total) by mouth daily. 30 tablet 3  . budesonide-formoterol (SYMBICORT) 160-4.5 MCG/ACT inhaler Inhale 2 puffs into the lungs 2 (two) times daily. 1 Inhaler 12  . buPROPion (WELLBUTRIN SR) 150 MG 12 hr tablet Take 1 tablet by mouth two  times daily 60 tablet 3  . Cholecalciferol (VITAMIN D-3) 1000 units CAPS Take 1,000 Units by mouth daily. Reported on 02/26/2015    . Chromium Picolinate 1000 MCG TABS Take 1,000 mcg by mouth daily. Reported on 02/26/2015    . citalopram (CELEXA) 40 MG tablet Take 1 tablet (40 mg total) by mouth daily. 90 tablet 3  . diltiazem (CARDIZEM CD)  240 MG 24 hr capsule Take 1 capsule (240 mg total) by mouth daily. 30 capsule 1  . fluticasone (FLONASE) 50 MCG/ACT nasal spray Place 2 sprays into both nostrils daily. 16 g 1  . furosemide (LASIX) 40 MG tablet Take 40 mg by mouth daily.    [name] gabapentin (NEURONTIN) 100 MG capsule 2 caps po tid (Patient taking differently: Take 200 mg by mouth 3 (three) times daily. 2 caps po tid) 180 capsule 4  . guaiFENesin (MUCINEX) 600 MG 12 hr tablet Take 2 tablets (1,200 mg total) by mouth 2 (two) times daily. 30 tablet 0  . Insulin Detemir (LEVEMIR FLEXTOUCH) 100 UNIT/ML Pen Inject 80 Units into the  skin at bedtime.  Change site of injection with every use. 45 mL 3  . Insulin Pen Needle 32G X 5 MM MISC Use to inject insulin TID as directed by  physician. 100 each 12  . insulin regular (NOVOLIN R,HUMULIN R) 100 units/mL injection Inject 16 units SQ with BF, 17 units SQ with Lunch, and 18 U SQ with Supper (Patient taking differently: Inject 16-18 Units into the skin See admin instructions. Inject 16 units SQ with BF, 17 units SQ with Lunch, and 18 U SQ with Supper) 1 vial 6  . isosorbide mononitrate (IMDUR) 30 MG 24 hr tablet Take 1 tablet by mouth  daily 90 tablet 3  . Multiple Vitamin (MULTIVITAMIN WITH MINERALS) TABS Take 1 tablet by mouth daily.    . nitroGLYCERIN (NITROSTAT) 0.4 MG SL tablet Place 1 tablet (0.4 mg total) under the tongue every 5 (five) minutes as needed for chest pain. 50 tablet 3  . pioglitazone (ACTOS) 45 MG tablet Take 45 mg by mouth daily. Reported on 02/26/2015    . PROAIR HFA 108 (90 Base) MCG/ACT inhaler INHALE 2 PUFFS INTO THE LUNGS EVERY 4 HOURS AS NEEDED FOR WHEEZING ORSHORTNESS OF BREATH 8.5 g 6  . rivaroxaban (XARELTO) 20 MG TABS tablet Take 1 tablet (20 mg total) by mouth daily with supper. 90 tablet 4  . tamsulosin (FLOMAX) 0.4 MG CAPS capsule TAKE 1 CAPSULE BY MOUTH EVERY DAY AFTER SUPPER 90 capsule 1   No facility-administered medications prior to visit.     Allergies   Allergen Reactions  . Morphine And Related Other (See Comments)    Drenched with perspiration  . Demerol [Meperidine] Nausea Only  . Starlix [Nateglinide] Other (See Comments)    gassy    ROS As per HPI  PE: Blood pressure 136/64, pulse 92, temperature 98.1 F (36.7 C), temperature source Oral, resp. rate 16, weight 252 lb 8 oz (114.5 kg), SpO2 93 %.  Repeat bp manually 136/64 today. Gen: Alert, well appearing.  Patient is oriented to person, place, time, and situation. AFFECT: pleasant, lucid thought and speech. LL's: 1 + pitting edema on RLL, 2+ pitting on L LL. Foot exam - no swelling, tenderness,or vascular lesions. Color and temperature is normal. Sensation is intact. Peripheral pulses are palpable but diminished. Toenails are normal.  Right 2nd toe with popped blister on proximal dorsal surface, with a small vesicle forming distal to this.  No surrounding erythema or swelling or streaking.  No tenderness.   LABS:    Chemistry      Component Value Date/Time   NA 136 11/21/2015 0825   K 4.2 11/21/2015 0825   CL 99 11/21/2015 0825   CO2 26 11/21/2015 0825   BUN 15 11/21/2015 0825   CREATININE 0.83 11/21/2015 0825   CREATININE 1.01 10/31/2015 1229      Component Value Date/Time   CALCIUM 9.2 11/21/2015 0825   ALKPHOS 60 11/30/2014 0441   AST 20 11/30/2014 0441   ALT 21 11/30/2014 0441   BILITOT 0.5 11/30/2014 0441     Lab Results  Component Value Date   HGBA1C 10.4 (H) 11/21/2015    IMPRESSION AND PLAN:  1) DM 2, with R foot 2nd toe vesicle x 2, one is burst. He has some evidence of impaired LE arterial flow: diminished pulses in DP and PT areas on both sides. General wound care measures: antibiotic ointment + cover with gauze. Diabetic shoes and socks rx given today pt. Of note, he is set up to start seeing an endocrinologist for his DM soon.  2) Chronic diastolic CHF: no sign of fluid overload.  Contnue lasix 73m qd, with plan of prn lasix for 3 lb wt  gain  in 1 day or 5 lb in 1 week, OR if increase in SOB.  Bradd Canary, [facility] nurse, very helpful with monitoring this.  3) HTN: His BP initially was up here today but at home it is normal and the repeat here today was normal. He'll remain off his ramipril and chlorthalidone (hx of ARF on these meds when concommitantly diuresed with lasix).  Continue home bp monitoring.  An After Visit Summary was printed and given to the patient.  FOLLOW UP: Return for keep appt set for Feb 2018.  Signed:  Crissie Sickles, MD           11/26/2015

## 2015-12-02 ENCOUNTER — Other Ambulatory Visit: Payer: Self-pay | Admitting: Family Medicine

## 2015-12-02 ENCOUNTER — Other Ambulatory Visit: Payer: Self-pay | Admitting: *Deleted

## 2015-12-02 NOTE — Patient Outreach (Signed)
Transition of care call and follow up for diabetic foot wound. Pt reports the blister on his toe burst before he went to see Dr. Anitra Lauth. He told pt to take care of his toe in the same fashion I had suggested. He also gave him an Rx for diabetic shoes. He and his wife and looking for a vendor currently. Pt states his toe still doesn't look good.   I will visit pt this afternoon to reassess his toe.  Deloria Lair [facility] [place] [phone]

## 2015-12-02 NOTE — Telephone Encounter (Signed)
RF request for gabapentin LOV: 11/26/15 Next ov: 02/19/16 Last written: 05/17/15 #180 w/ 4RF

## 2015-12-03 ENCOUNTER — Telehealth: Payer: Self-pay | Admitting: Family Medicine

## 2015-12-03 ENCOUNTER — Ambulatory Visit (INDEPENDENT_AMBULATORY_CARE_PROVIDER_SITE_OTHER): Payer: Medicare Other | Admitting: Pulmonary Disease

## 2015-12-03 ENCOUNTER — Encounter: Payer: Self-pay | Admitting: Pulmonary Disease

## 2015-12-03 ENCOUNTER — Other Ambulatory Visit: Payer: Self-pay | Admitting: *Deleted

## 2015-12-03 VITALS — BP 136/70 | HR 91 | Ht 71.5 in | Wt 248.4 lb

## 2015-12-03 DIAGNOSIS — Z77090 Contact with and (suspected) exposure to asbestos: Secondary | ICD-10-CM

## 2015-12-03 DIAGNOSIS — L089 Local infection of the skin and subcutaneous tissue, unspecified: Secondary | ICD-10-CM

## 2015-12-03 DIAGNOSIS — J439 Emphysema, unspecified: Secondary | ICD-10-CM | POA: Diagnosis not present

## 2015-12-03 DIAGNOSIS — L97501 Non-pressure chronic ulcer of other part of unspecified foot limited to breakdown of skin: Principal | ICD-10-CM

## 2015-12-03 DIAGNOSIS — E11621 Type 2 diabetes mellitus with foot ulcer: Secondary | ICD-10-CM

## 2015-12-03 MED ORDER — FLUTICASONE-UMECLIDIN-VILANT 100-62.5-25 MCG/INH IN AEPB: 1.0000 | INHALATION_SPRAY | Freq: Every day | RESPIRATORY_TRACT | 0 refills | Status: DC

## 2015-12-03 MED ORDER — FLUTICASONE-UMECLIDIN-VILANT 100-62.5-25 MCG/INH IN AEPB: 1.0000 | INHALATION_SPRAY | Freq: Every day | RESPIRATORY_TRACT | 2 refills | Status: DC

## 2015-12-03 MED ORDER — FLUTICASONE-UMECLIDIN-VILANT 100-62.5-25 MCG/INH IN AEPB: 1.0000 | INHALATION_SPRAY | Freq: Every day | RESPIRATORY_TRACT | 11 refills | Status: DC

## 2015-12-03 NOTE — Telephone Encounter (Signed)
Please advise. Thanks.

## 2015-12-03 NOTE — Telephone Encounter (Signed)
Patient R 2nd toe is infected. Please enter wound care referral

## 2015-12-03 NOTE — Patient Outreach (Signed)
Acute home visit, following up on toe wound on a diabetic.  S:  Pt and wife report his Right 2nd toe looks worse, the blister did burst. They have been washing it daily and applying neosporin and a dressing.  O:  Removed dressing. Toe is red with 1 cm open blister, partially covered by skin. The wound is more fluid filled today.  A:  Infected toe wound in a diabetic  P:  Snapped a photograph - Will demonstrate to Dr. Anitra Lauth tomorrow am.       Called in Rx for cephalexin 500 mg one po bid for 14 days.  Deloria Lair [facility] [place] [phone]  12/03/15 Demonstrated photo to Dr. Anitra Lauth. He will refer pt to wound center for evalutation.  I called and left a message for Mr and Mrs Damiani with that information.  Deloria Lair [facility] [place] [phone]

## 2015-12-03 NOTE — Progress Notes (Signed)
Patient seen in the office today and instructed on use of Trelegy.  Patient expressed understanding and demonstrated technique.  Parke Poisson [facility] 12/03/2015

## 2015-12-03 NOTE — Patient Instructions (Signed)
Stop taking the Symbicort. Will start you on the Trelegy inhaler to be used once a day. This study you for a split-night sleep study Please ensure that he have a follow-up with the cardiologist.  Return to clinic in 3 months.

## 2015-12-03 NOTE — Telephone Encounter (Signed)
Arbie Cookey, health care nurse, notified via voice mail.

## 2015-12-03 NOTE — Progress Notes (Signed)
Noted/Agree.  Signed:  Crissie Sickles, MD           12/03/2015

## 2015-12-03 NOTE — Telephone Encounter (Signed)
Wound care clinic referral ordered.

## 2015-12-03 NOTE — Progress Notes (Signed)
Joshua [name]    [id number]    15-Jul-1940  Primary Care Physician:MCGOWEN,PHILIP H, MD  Referring Physician: Tammi Sou, MD [address] [place], [place] [address]  Chief complaint:   Follow up for COPD Asbestos exposure  HPI:  [name] is a 75 year old with history of COPD GOLD D. He was hospitalized in October 2016 with acute on chronic respiratory failure secondary to diastolic heart failure, COPD exacerbation. Since his discharge he has been doing well. He had an ER visit in March 2017 for a fall and forearm laceration.   He is a heavy smoker he smoked about 3 packs per day for 40 years. He quit last month after this hospitalization. He worked as a Advertising account planner. He reports exposure to asbestos for about a year to 2 years between 1958 and 1959. He's had spirometry in 2004 which showed borderline obstruction. Repeat pulmonary function tests in 2015 shows moderate obstruction. He's been maintained on Symbicort for many years. He does not take it on a regular basis and skips some days.   Interim history: Was hospitalized a number of this year for COPD exacerbation. He feels that he is back to his baseline but his wife states that he still dyspneic on exertion. He has chronic cough with sputum production. He is not taking his Symbicort as directed and is using it only once a day.  Outpatient Encounter Prescriptions as of 12/03/2015  Medication Sig  . acetaminophen (TYLENOL) 325 MG tablet Take 2 tablets (650 mg total) by mouth every 4 (four) hours as needed for headache or mild pain.  [name] albuterol (PROVENTIL HFA;VENTOLIN HFA) 108 (90 Base) MCG/ACT inhaler Inhale 2 puffs into the lungs every 6 (six) hours as needed for wheezing or shortness of breath.  [name] amLODipine (NORVASC) 5 MG tablet Take 1 tablet (5 mg total) by mouth daily.  [name] aspirin 81 MG tablet Take 81 mg by mouth at bedtime. Reported on 02/26/2015  . atorvastatin (LIPITOR) 40 MG tablet Take 1  tablet (40 mg total) by mouth daily.  . budesonide-formoterol (SYMBICORT) 160-4.5 MCG/ACT inhaler Inhale 2 puffs into the lungs 2 (two) times daily.  [name] buPROPion (WELLBUTRIN SR) 150 MG 12 hr tablet Take 1 tablet by mouth two  times daily  . Cholecalciferol (VITAMIN D-3) 1000 units CAPS Take 1,000 Units by mouth daily. Reported on 02/26/2015  . Chromium Picolinate 1000 MCG TABS Take 1,000 mcg by mouth daily. Reported on 02/26/2015  . citalopram (CELEXA) 40 MG tablet Take 1 tablet (40 mg total) by mouth daily.  [name] diltiazem (CARDIZEM CD) 240 MG 24 hr capsule Take 1 capsule (240 mg total) by mouth daily.  . fluticasone (FLONASE) 50 MCG/ACT nasal spray Place 2 sprays into both nostrils daily.  . furosemide (LASIX) 40 MG tablet Take 40 mg by mouth daily.  [name] gabapentin (NEURONTIN) 100 MG capsule TAKE 2 CAPSULES BY MOUTH 3 TIMES DAILY  . guaiFENesin (MUCINEX) 600 MG 12 hr tablet Take 2 tablets (1,200 mg total) by mouth 2 (two) times daily.  . Insulin Detemir (LEVEMIR FLEXTOUCH) 100 UNIT/ML Pen Inject 80 Units into the  skin at bedtime.  Change site of injection with every use. (Patient taking differently: 82 Units. Inject 80 Units into the  skin at bedtime.  Change site of injection with every use.)  . Insulin Pen Needle 32G X 5 MM MISC Use to inject insulin TID as directed by physician.  [name]  insulin regular (NOVOLIN R,HUMULIN R) 100 units/mL injection Inject 16 units SQ with BF, 17 units SQ with Lunch, and 18 U SQ with Supper (Patient taking differently: Inject 16-18 Units into the skin See admin instructions. Inject 16 units SQ with BF, 17 units SQ with Lunch, and 18 U SQ with Supper)  . isosorbide mononitrate (IMDUR) 30 MG 24 hr tablet Take 1 tablet by mouth  daily  . Multiple Vitamin (MULTIVITAMIN WITH MINERALS) TABS Take 1 tablet by mouth daily.  . nitroGLYCERIN (NITROSTAT) 0.4 MG SL tablet Place 1 tablet (0.4 mg total) under the tongue every 5 (five) minutes as needed for chest pain.  . pioglitazone  (ACTOS) 45 MG tablet Take 45 mg by mouth daily. Reported on 02/26/2015  . PROAIR HFA 108 (90 Base) MCG/ACT inhaler INHALE 2 PUFFS INTO THE LUNGS EVERY 4 HOURS AS NEEDED FOR WHEEZING ORSHORTNESS OF BREATH  . rivaroxaban (XARELTO) 20 MG TABS tablet Take 1 tablet (20 mg total) by mouth daily with supper.  . tamsulosin (FLOMAX) 0.4 MG CAPS capsule TAKE 1 CAPSULE BY MOUTH EVERY DAY AFTER SUPPER  . [DISCONTINUED] gabapentin (NEURONTIN) 100 MG capsule 2 caps po tid (Patient taking differently: Take 200 mg by mouth 3 (three) times daily. 2 caps po tid)   No facility-administered encounter medications on file as of 12/03/2015.     Allergies as of 12/03/2015 - Review Complete 12/03/2015  Allergen Reaction Noted  . Morphine and related Other (See Comments) 09/01/2011  . Demerol [meperidine] Nausea Only 09/01/2011  . Starlix [nateglinide] Other (See Comments) 09/01/2011    Past Medical History:  Diagnosis Date  . Adenomatous colon polyp 10/16/2011   Repeat 2018  . Arthritis     Hips, R>L & KNEES  . CAD, multiple vessel    3V CAD cath 08/08/13----CABG done shortly after.  . 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 pylori neg, no dysplasia or malignancy.  . Chronic diastolic heart failure ([facility]) 2016  . Chronic renal insufficiency, stage II (mild)   . COPD (chronic obstructive pulmonary disease) ([facility])    GOLD II.  Spirometry  2004 borderline obstruction; 2015 mod obst; NEEDS REPEAT PFTs + albut nebs ordered (11/2015)  . Diabetic nephropathy (H[facility])    Elevated urine microalb/cr 03/2011  . DM type 2 (diabetes mellitus, type 2) (H[facility])    Poor control on max oral meds--pt eventually agreed to insulin therap  . DOE (dyspnea on exertion)    COPD + chronic diastolic HF  . Erectile dysfunction    Normal testosterone  . Hearing loss of both ears 2016   Hearing aids  . Hyperlipidemia   . Hyperplastic colon polyp 2001  . Hypertension   . Iron deficiency anemia 2014    03/2012 capsule endoscopy showed 2 AVMs--likely responsible for his IDA--lifetime iron supp recommended + q52moCBCs.  . Macular degeneration, dry    Mild, bilat (Optometrist, [place]  . Obesity   . Other and unspecified angina pectoris   . PAF (paroxysmal atrial fibrillation) ([facility] 10/2014   xarelto  . Pericardial effusion with cardiac tamponade 6//29/15   pericardiocentesis was done,  Infectious/inflamm (cytology showed NO MALIGNANT CELLS)  . Pneumonia   . Tobacco dependence in remission    100+ pack-yr hx: quit after CABG    Past Surgical History:  Procedure Laterality Date  . APPENDECTOMY  1957  . CARDIAC CATHETERIZATION  08/08/2013  . CATARACT EXTRACTION W/ INTRAOCULAR LENS  IMPLANT, BILATERAL  04/08/2006 & 04/22/2006  . CATARACT EXTRACTION W/ INTRAOCULAR LENS  IMPLANT, BILATERAL Bilateral   . CHOLECYSTECTOMY OPEN  1999  . COLONOSCOPY  10/16/2011   Procedure: COLONOSCOPY;  Surgeon: Inda Castle, MD;  Location: [facility];  Service: Endoscopy;  Laterality: N/A;  . CORONARY ARTERY BYPASS GRAFT N/A 08/14/2013   Procedure: CORONARY ARTERY BYPASS GRAFTING (CABG);  Surgeon: Melrose Nakayama, MD;  Location: [facility];  Service: Open Heart Surgery;  Laterality: N/A;  Times 4   using left internal mammary artery and endoscopically harvested bilateral saphenous vein  . ESOPHAGOGASTRODUODENOSCOPY  02/25/12   Atrophic gastritis with a few erosions--capsule endoscopy planned as of 02/25/12 (Dr. Deatra Ina).  [name] HARDWARE REMOVAL Right 08/12/2012   Procedure: HARDWARE REMOVAL;  Surgeon: Mcarthur Rossetti, MD;  Location: [facility];  Service: Orthopedics;  Laterality: Right;  . HIP SURGERY Right 1954   Repair of slipped capital femoral epiphysis.  . INTRAOPERATIVE TRANSESOPHAGEAL ECHOCARDIOGRAM N/A 08/14/2013   Normal LV function. Procedure: INTRAOPERATIVE TRANSESOPHAGEAL ECHOCARDIOGRAM;  Surgeon: Melrose Nakayama, MD;  Location: [facility];  Service: Open  Heart Surgery;  Laterality: N/A;  . LEFT HEART CATHETERIZATION WITH CORONARY ANGIOGRAM N/A 08/08/2013   Procedure: LEFT HEART CATHETERIZATION WITH CORONARY ANGIOGRAM;  Surgeon: Jettie Booze, MD;  Location: [facility] CATH LAB;  Service: Cardiovascular;  Laterality: N/A;  . PATELLA FRACTURE SURGERY Left ~ 1979   bolt + 3 screws to repair tib plateau fx  . PERICARDIAL TAP N/A 07/01/2013   Procedure: PERICARDIAL TAP;  Surgeon: Jettie Booze, MD;  Location: [facility] CATH LAB;  Service: Cardiovascular;  Laterality: N/A;  . TESTICLE SURGERY  as a child   Undescended testicle brought down into scrotum  . TONSILLECTOMY  1947  . TOTAL HIP ARTHROPLASTY Right 08/12/2012   Procedure: REMOVAL OF OLD PINS RIGHT HIP AND RIGHT TOTAL HIP ARTHROPLASTY ANTERIOR APPROACH;  Surgeon: Mcarthur Rossetti, MD;  Location: [facility];  Service: Orthopedics;  Laterality: Right;  . TRANSTHORACIC ECHOCARDIOGRAM  10/30/14   Mod LVH, EF 60-65%, normal wall motion, mod mitral regurg, mild PAH    Family History  Problem Relation Age of Onset  . Heart disease Father   . Heart attack Father   . CVA Mother   . Hypertension Mother   . Diabetes Paternal Grandmother   . Breast cancer Sister   . Diabetes Maternal Uncle   . Heart disease Maternal Uncle   . Stroke Neg Hx     Social History   Social History  . Marital status: Married    Spouse name: [name]  . Number of children: 4  . Years of education: N/A   Occupational History  . bus operator    Social History Main Topics  . Smoking status: Former Smoker    Packs/day: 0.25    Years: 62.00    Types: Cigarettes    Quit date: 10/23/2015  . Smokeless tobacco: Former Systems developer    Quit date: 06/30/2013  . Alcohol use No  . Drug use: No  . Sexual activity: No   Other Topics Concern  . Not on file   Social History Narrative   Married, 4 children.   Formerly a Medical illustrator.   Local transportation bus driver.  Level of education: HS.      +tobacco--lifelong (still smoking as of 08/2011).  No alcohol or drugs.   No exercise.  +excessive caffeine.     Review of systems: Review of Systems  Constitutional: Negative for fever and  chills.  HENT: Negative.   Eyes: Negative for blurred vision.  Respiratory: as per HPI  Cardiovascular: Negative for chest pain and palpitations.  Gastrointestinal: Negative for vomiting, diarrhea, blood per rectum. Genitourinary: Negative for dysuria, urgency, frequency and hematuria.  Musculoskeletal: Negative for myalgias, back pain and joint pain.  Skin: Negative for itching and rash.  Neurological: Negative for dizziness, tremors, focal weakness, seizures and loss of consciousness.  Endo/Heme/Allergies: Negative for environmental allergies.  Psychiatric/Behavioral: Negative for depression, suicidal ideas and hallucinations.  All other systems reviewed and are negative.   Physical Exam: Blood pressure 136/70, pulse 91, height 5' 11.5" (1.816 m), weight 248 lb 6.4 oz (112.7 kg), SpO2 94 %. Gen:      No acute distress HEENT:  EOMI, sclera anicteric Neck:     No masses; no thyromegaly Lungs:    Clear to auscultation bilaterally; normal respiratory effort CV:         Regular rate and rhythm; no murmurs Abd:      + bowel sounds; soft, non-tender; no palpable masses, no distension Ext:    No edema; adequate peripheral perfusion Skin:      Warm and dry; no rash Neuro: alert and oriented x 3 Psych: normal mood and affect  Data Reviewed: Imaging CT chest [07/02/13] There are small, layering bilateral pleural effusions. Bilateral calcified pleural plaques with a Deep pendant and basilar predominance. No concerning pleural nodularity. There is dependent atelectasis. Subpleural ground-glass density at the right apex measuring up to 2.4 cm. No evidence of suspicious, solid, nodule.  Chest x-ray 10/23/15-bilateral pleural plaques with calcification, cardiomegaly. All images reviewed.  PFTs  [08/09/13] FVC 3.24 [74%] FEV1 2.12 [65%] F/F 65 TLC 79 DLCO 71. Moderate obstruction, no significant bronchodilator response. Mild restrictive lung disease Decreased diffusion capacity that corrects when adjusted for alveolar volume.   Echo [10/30/14] - Left ventricle: The cavity size was normal. Wall thickness was increased in a pattern of moderate LVH. Systolic function was normal. The estimated ejection fraction was in the range of 60% to 65%. Wall motion was normal; there were no regional wall motion abnormalities. - Mitral valve: Moderately calcified annulus. There was moderate regurgitation. - Right atrium: The atrium was mildly dilated. - Pulmonary arteries: Systolic pressure was mildly increased. PA peak pressure: 38 mm Hg (S).  Assessment:  #1 COPD,GOLD D He has had a recent exacerbation. He is not taking his Symbicort as directed as he forgets to take the second dose on most days. I'll start her on the Trelegy inhaler to urology to be used once a day with the hope of increasing his compliance. He remains off cigarettes since Oct 2016. I also asked him to follow up with his cardiologist since he hasn't had a clinic visit for the past 1 year for reevaluation of his chronic cardiac issues.   #2 Asbestos exposure He has asbestos exposure in the 1950s. He feels that this exposure not considerable however he has calcified pleural plaques on CT scan. There is no evidence of pulmonary fibrosis resulting from asbestos. We'll continue to monitor symptoms and that annual imaging with chest x-ray and regular PFTs.  #3 Suspected OSA Schedule sleep study  Plan/Recommendations: - Stop symbicort. Start trelegy inhaler - Split night sleep study.  Marshell Garfinkel MD [facility] Pager [phone] 12/03/2015, 9:15 AM  CC: McGowen, Adrian Blackwater, MD

## 2015-12-05 NOTE — Telephone Encounter (Signed)
Patient has a wound care appt 12/13

## 2015-12-09 ENCOUNTER — Other Ambulatory Visit: Payer: Self-pay | Admitting: Family Medicine

## 2015-12-09 ENCOUNTER — Other Ambulatory Visit: Payer: Self-pay | Admitting: *Deleted

## 2015-12-09 NOTE — Telephone Encounter (Signed)
RF request for levemir LOV: 11/26/15 Next ov: 02/19/16 Last written: 10/26/15 #2m w/ 3RF  Rx sent for #[id number]/ 1RF. Pt was advised at ov on 11/26/15 to start f/u with endo.

## 2015-12-09 NOTE — Patient Outreach (Signed)
Telephone assessment. Called and left a message to return my call.  Joshua [name] [facility] [place] [phone]  Called again. Pt is doing well. His wife reports his toe appears to be healing with the antibiotic treatment and the wound care she is provideing. HE is to go to the wound clinic next week. I have encouraged her to call me if the wound appears worse or if he starts running a fever or not feeling well. I will go see him on Monday.  Joshua [name] [facility] [place] [phone]

## 2015-12-10 ENCOUNTER — Other Ambulatory Visit: Payer: Self-pay | Admitting: Interventional Cardiology

## 2015-12-10 MED ORDER — RIVAROXABAN 20 MG PO TABS: 20.0000 mg | ORAL_TABLET | Freq: Every day | ORAL | 0 refills | Status: DC

## 2015-12-13 ENCOUNTER — Other Ambulatory Visit: Payer: Self-pay | Admitting: *Deleted

## 2015-12-13 NOTE — Patient Outreach (Signed)
Brief home visit to re-evaluate R 2nd toe wound. Primary area of concern is healing nicely. It is no longer an open wound. The toe is no longer red. The distal pressure area remains discolored but blanches. Pt has 1+ pedal pulse. I feel comfortable with pt cancelling his wound clinic appt next week. I have suggested a band aid on the distal aspect of this toe to prevent further friction. Pt has an appt on Monday for ordering his diabetic shoes.  Joshua [name] [facility] [place] [phone]

## 2015-12-15 ENCOUNTER — Encounter: Payer: Self-pay | Admitting: Family Medicine

## 2015-12-16 ENCOUNTER — Ambulatory Visit: Payer: Self-pay | Admitting: *Deleted

## 2015-12-16 ENCOUNTER — Other Ambulatory Visit: Payer: Self-pay | Admitting: Family Medicine

## 2015-12-17 ENCOUNTER — Encounter: Payer: Self-pay | Admitting: Endocrinology

## 2015-12-17 ENCOUNTER — Ambulatory Visit (INDEPENDENT_AMBULATORY_CARE_PROVIDER_SITE_OTHER): Payer: Medicare Other | Admitting: Endocrinology

## 2015-12-17 VITALS — BP 160/78 | HR 85 | Ht 72.0 in | Wt 259.0 lb

## 2015-12-17 DIAGNOSIS — E1142 Type 2 diabetes mellitus with diabetic polyneuropathy: Secondary | ICD-10-CM

## 2015-12-17 DIAGNOSIS — E782 Mixed hyperlipidemia: Secondary | ICD-10-CM | POA: Diagnosis not present

## 2015-12-17 DIAGNOSIS — Z794 Long term (current) use of insulin: Secondary | ICD-10-CM | POA: Diagnosis not present

## 2015-12-17 DIAGNOSIS — E1165 Type 2 diabetes mellitus with hyperglycemia: Secondary | ICD-10-CM

## 2015-12-17 MED ORDER — EMPAGLIFLOZIN-METFORMIN HCL 5-1000 MG PO TABS: 1.0000 | ORAL_TABLET | Freq: Two times a day (BID) | ORAL | 1 refills | Status: DC

## 2015-12-17 NOTE — Patient Instructions (Addendum)
Check blood sugars on waking up 3-4x per week   Also check blood sugars about 2 hours after a meal and do this after different meals by rotation including after breakfast and lunch   Check blood sugars about twice a day on an average  Recommended blood sugar levels on waking up is 90-130 and about 2 hours after meal is 130-160  Please bring your blood sugar monitor to each visit, thank you  LEVEMIR insulin: Change the dose to 40 units at breakfast and supper  NOVOLIN R: This needs to be taken about 15 minutes BEFORE eating Increase the dose at suppertime to 22 units for now  Call if having blood sugar drops  AMLODIPINE: Take half tablet daily since Jardiance will make your blood pressure comes down Check your blood pressure monitor with the doctors office for comparison  Make sure you're taking atorvastatin for cholesterol

## 2015-12-17 NOTE — Progress Notes (Signed)
Patient ID: Joshua [name], male   DOB: 1940/05/31, 75 y.o.   MRN: [id number]            Reason for Appointment: Consultation for Type 2 Diabetes  Referring physician: [place]   History of Present Illness:          Date of diagnosis of type 2 diabetes mellitus: ?  2002        Background history:   He apparently had been treated with metformin initially which was continued until about 2016. He previously has also been tried on Amaryl, Actos, Farxiga and Victoza but he claims that these did not help his blood sugar and were stopped Appears to have been on insulin since about 2014 but he does not remember details. He has been taking mostly Levemir insulin as a basal insulin but was also on Lantus in 2015 for some time His best A1c has been 7.3, once in 2014 and another time in 2016, otherwise his A1c has been higher and as high as 10.8  Recent history:   INSULIN regimen is:   Levemir 82 units at bedtime, Novolin R 17-18-19 units pc meals      Non-insulin hypoglycemic drugs the patient is taking are: Actos 45 mg daily  Current management, blood sugar patterns and problems identified:  He appears to have had progressively higher blood sugars with increasing A1c this year.  He probably has been off his metformin and about a year ago when likely this was stopped when he did not perceive any benefit from this  He has been taking regular doses of Levemir at night and the dose has been unchanged for several months.  He takes his Novolin R insulin right after eating usually  Currently he is checking his blood sugar when he wakes up early morning and around bedtime when he takes his Levemir and no other time.  Blood sugars in the morning having recently better but not consistently  His blood sugars are much higher at bedtime and averaging well over 300  He is not able to exercise because of pain in his legs and difficulty with balance          Side effects from medications have been:  None  Compliance with the medical regimen: Fair Hypoglycemia: Never    Glucose monitoring:  done  times a day         Glucometer: One Touch.      Blood Glucose readings by time of day and averages from meter download:  PREMEAL Breakfast Lunch Dinner Bedtime  Overall   Glucose range: 105-312    268-483  105-483   Median: 214    350  279    Self-care: The diet that the patient has been following is: tries to limit Sweet drinks .     Meal times are:  Breakfast is at 7 Lunch: Dinner: 5-6   Typical meal intake: Breakfast is usually eggs, oatmeal or grits.  Lunch is a sandwich, any evening he has meat and vegetables, For snacks he will have fruit or crackers               Dietician visit, most recent: Years ago               Exercise: Unable to do any     Weight history:  Wt Readings from Last 3 Encounters:  12/17/15 259 lb (117.5 kg)  12/03/15 248 lb 6.4 oz (112.7 kg)  11/26/15 252 lb 8 oz (114.5 kg)  Glycemic control:   Lab Results  Component Value Date   HGBA1C 10.4 (H) 11/21/2015   HGBA1C 9.2 (H) 08/21/2015   HGBA1C 8.8 (H) 05/17/2015   Lab Results  Component Value Date   MICROALBUR 4.6 (H) 12/17/2014   LDLCALC 63 07/04/2013   CREATININE 0.83 11/21/2015   Lab Results  Component Value Date   MICRALBCREAT 6.4 12/17/2014         Medication List       Accurate as of 12/17/15  4:46 PM. Always use your most recent med list.          acetaminophen 325 MG tablet Commonly known as:  TYLENOL Take 2 tablets (650 mg total) by mouth every 4 (four) hours as needed for headache or mild pain.   albuterol 108 (90 Base) MCG/ACT inhaler Commonly known as:  PROVENTIL HFA;VENTOLIN HFA Inhale 2 puffs into the lungs every 6 (six) hours as needed for wheezing or shortness of breath.   PROAIR HFA 108 (90 Base) MCG/ACT inhaler Generic drug:  albuterol INHALE 2 PUFFS INTO THE LUNGS EVERY 4 HOURS AS NEEDED FOR WHEEZING ORSHORTNESS OF BREATH   amLODipine 5 MG  tablet Commonly known as:  NORVASC Take 1 tablet (5 mg total) by mouth daily.   aspirin 81 MG tablet Take 81 mg by mouth at bedtime. Reported on 02/26/2015   atorvastatin 40 MG tablet Commonly known as:  LIPITOR Take 1 tablet (40 mg total) by mouth daily.   budesonide-formoterol 160-4.5 MCG/ACT inhaler Commonly known as:  SYMBICORT Inhale 2 puffs into the lungs 2 (two) times daily.   buPROPion 150 MG 12 hr tablet Commonly known as:  WELLBUTRIN SR TAKE ONE TABLET BY MOUTH TWICE DAILY   Chromium Picolinate 1000 MCG Tabs Take 1,000 mcg by mouth daily. Reported on 02/26/2015   citalopram 40 MG tablet Commonly known as:  CELEXA Take 1 tablet (40 mg total) by mouth daily.   diltiazem 240 MG 24 hr capsule Commonly known as:  CARDIZEM CD Take 1 capsule (240 mg total) by mouth daily.   Empagliflozin-Metformin HCl 05-998 MG Tabs Commonly known as:  SYNJARDY Take 1 tablet by mouth 2 (two) times daily after a meal.   fluticasone 50 MCG/ACT nasal spray Commonly known as:  FLONASE Place 2 sprays into both nostrils daily.   Fluticasone-Umeclidin-Vilant 100-62.5-25 MCG/INH Aepb Commonly known as:  TRELEGY ELLIPTA Inhale 1 puff into the lungs daily.   Fluticasone-Umeclidin-Vilant 100-62.5-25 MCG/INH Aepb Commonly known as:  TRELEGY ELLIPTA Inhale 1 puff into the lungs daily.   Fluticasone-Umeclidin-Vilant 100-62.5-25 MCG/INH Aepb Commonly known as:  TRELEGY ELLIPTA Inhale 1 puff into the lungs daily.   furosemide 40 MG tablet Commonly known as:  LASIX Take 40 mg by mouth daily.   gabapentin 100 MG capsule Commonly known as:  NEURONTIN TAKE 2 CAPSULES BY MOUTH 3 TIMES DAILY   guaiFENesin 600 MG 12 hr tablet Commonly known as:  MUCINEX Take 2 tablets (1,200 mg total) by mouth 2 (two) times daily.   Insulin Detemir 100 UNIT/ML Pen Commonly known as:  LEVEMIR FLEXTOUCH Inject 80 Units into the  skin at bedtime.  Change site of injection with every use.   LEVEMIR FLEXTOUCH  100 UNIT/ML Pen Generic drug:  Insulin Detemir INJECT 55 UNITS INTO THE SKIN AT BEDTIME   Insulin Pen Needle 32G X 5 MM Misc Use to inject insulin TID as directed by physician.   insulin regular 250 units/2.61m (100 units/mL) injection Commonly known as:  NOVOLIN R,HUMULIN R Inject  16 units SQ with BF, 17 units SQ with Lunch, and 18 U SQ with Supper   isosorbide mononitrate 30 MG 24 hr tablet Commonly known as:  IMDUR Take 1 tablet by mouth  daily   multivitamin with minerals Tabs tablet Take 1 tablet by mouth daily.   nitroGLYCERIN 0.4 MG SL tablet Commonly known as:  NITROSTAT Place 1 tablet (0.4 mg total) under the tongue every 5 (five) minutes as needed for chest pain.   pioglitazone 45 MG tablet Commonly known as:  ACTOS Take 45 mg by mouth daily. Reported on 02/26/2015   rivaroxaban 20 MG Tabs tablet Commonly known as:  XARELTO Take 1 tablet (20 mg total) by mouth daily with supper.   tamsulosin 0.4 MG Caps capsule Commonly known as:  FLOMAX TAKE 1 CAPSULE BY MOUTH EVERY DAY AFTER SUPPER   Vitamin D-3 1000 units Caps Take 1,000 Units by mouth daily. Reported on 02/26/2015       Allergies:  Allergies  Allergen Reactions  . Morphine And Related Other (See Comments)    Drenched with perspiration  . Demerol [Meperidine] Nausea Only  . Starlix [Nateglinide] Other (See Comments)    gassy    Past Medical History:  Diagnosis Date  . Adenomatous colon polyp 10/16/2011   Repeat 2018  . Arthritis     Hips, R>L & KNEES  . CAD, multiple vessel    3V CAD cath 08/08/13----CABG done shortly after.  . 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 pylori neg, no dysplasia or malignancy.  . Chronic diastolic heart failure ([place]) 2016  . Chronic renal insufficiency, stage II (mild)   . COPD (chronic obstructive pulmonary disease) ([place])    GOLD II.  Spirometry  2004 borderline obstruction; 2015 mod obst; NEEDS REPEAT PFTs + albut nebs ordered  (11/2015)  . Diabetic nephropathy (HCC)    Elevated urine microalb/cr 03/2011  . DM type 2 (diabetes mellitus, type 2) (HCC)    Poor control on max oral meds--pt eventually agreed to insulin therap  . DOE (dyspnea on exertion)    COPD + chronic diastolic HF  . Erectile dysfunction    Normal testosterone  . Hearing loss of both ears 2016   Hearing aids  . Hyperlipidemia   . Hyperplastic colon polyp 2001  . Hypertension   . Iron deficiency anemia 2014   03/2012 capsule endoscopy showed 2 AVMs--likely responsible for his IDA--lifetime iron supp recommended + q69moCBCs.  . Macular degeneration, dry    Mild, bilat (Optometrist, [place]  . Obesity   . Open toe wound 11/2015   Wound care clinic appt made and then canceled when toe improved.  . Other and unspecified angina pectoris   . [place]   xarelto  . Pericardial effusion with cardiac tamponade 6//29/15   pericardiocentesis was done,  Infectious/inflamm (cytology showed NO MALIGNANT CELLS)  . Pneumonia   . Tobacco dependence in remission    100+ pack-yr hx: quit after CABG    Past Surgical History:  Procedure Laterality Date  . APPENDECTOMY  1957  . CARDIAC CATHETERIZATION  08/08/2013  . CATARACT EXTRACTION W/ INTRAOCULAR LENS  IMPLANT, BILATERAL  04/08/2006 & 04/22/2006  . CATARACT EXTRACTION W/ INTRAOCULAR LENS  IMPLANT, BILATERAL Bilateral   . CHOLECYSTECTOMY OPEN  1999  . COLONOSCOPY  10/16/2011   Procedure: COLONOSCOPY;  Surgeon: RInda Castle MD;  Location: [facility];  Service:  Endoscopy;  Laterality: N/A;  . CORONARY ARTERY BYPASS GRAFT N/A 08/14/2013   Procedure: CORONARY ARTERY BYPASS GRAFTING (CABG);  Surgeon: Melrose Nakayama, MD;  Location: [facility];  Service: Open Heart Surgery;  Laterality: N/A;  Times 4   using left internal mammary artery and endoscopically harvested bilateral saphenous vein  . ESOPHAGOGASTRODUODENOSCOPY   02/25/12   Atrophic gastritis with a few erosions--capsule endoscopy planned as of 02/25/12 (Dr. Deatra Ina).  [name] HARDWARE REMOVAL Right 08/12/2012   Procedure: HARDWARE REMOVAL;  Surgeon: Mcarthur Rossetti, MD;  Location: [facility];  Service: Orthopedics;  Laterality: Right;  . HIP SURGERY Right 1954   Repair of slipped capital femoral epiphysis.  . INTRAOPERATIVE TRANSESOPHAGEAL ECHOCARDIOGRAM N/A 08/14/2013   Normal LV function. Procedure: INTRAOPERATIVE TRANSESOPHAGEAL ECHOCARDIOGRAM;  Surgeon: Melrose Nakayama, MD;  Location: [facility];  Service: Open Heart Surgery;  Laterality: N/A;  . LEFT HEART CATHETERIZATION WITH CORONARY ANGIOGRAM N/A 08/08/2013   Procedure: LEFT HEART CATHETERIZATION WITH CORONARY ANGIOGRAM;  Surgeon: Jettie Booze, MD;  Location[facility];  Service: Cardiovascular;  Laterality: N/A;  . PATELLA FRACTURE SURGERY Left ~ 1979   bolt + 3 screws to repair tib plateau fx  . PERICARDIAL TAP N/A 07/01/2013   Procedure: PERICARDIAL TAP;  Surgeon: Jettie Booze, MD;  Location: [facility] CATH LAB;  Service: Cardiovascular;  Laterality: N/A;  . TESTICLE SURGERY  as a child   Undescended testicle brought down into scrotum  . TONSILLECTOMY  1947  . TOTAL HIP ARTHROPLASTY Right 08/12/2012   Procedure: REMOVAL OF OLD PINS RIGHT HIP AND RIGHT TOTAL HIP ARTHROPLASTY ANTERIOR APPROACH;  Surgeon: Mcarthur Rossetti, MD;  Location: [facility];  Service: Orthopedics;  Laterality: Right;  . TRANSTHORACIC ECHOCARDIOGRAM  10/30/14   Mod LVH, EF 60-65%, normal wall motion, mod mitral regurg, mild PAH    Family History  Problem Relation Age of Onset  . Heart disease Father   . Heart attack Father   . CVA Mother   . Hypertension Mother   . Diabetes Paternal Grandmother   . Breast cancer Sister   . Diabetes Maternal Uncle   . Heart disease Maternal Uncle   . Stroke Neg Hx     Social History:  reports that he quit smoking about 7 weeks ago. His smoking use included Cigarettes. He has a  15.50 pack-year smoking history. He quit smokeless tobacco use about 2 years ago. He reports that he does not drink alcohol or use drugs.   Review of Systems  Constitutional: Negative for weight loss and reduced appetite.  HENT: Negative for headaches.   Eyes: Negative for blurred vision.  Respiratory: Positive for shortness of breath.        Has shortness of breath on exertion, has had COPD, required admission for treatment in October  Cardiovascular: Positive for leg swelling. Negative for chest pain.       Has had chronic swelling of the legs, recently more  Gastrointestinal: Negative for constipation and diarrhea.  Endocrine: Positive for fatigue and erectile dysfunction. Negative for polydipsia.  Genitourinary: Positive for nocturia.  Musculoskeletal: Positive for joint pain.  Neurological: Positive for numbness.       Has had some pains in his legs, controlled with gabapentin 200 mg 3 times a day  Psychiatric/Behavioral:       Has had depression, on treatment     Lipid history: He had been prescribed Lipitor previously but not clear if he had taken it in the last year and has  been prescribed this again in November by PCP, he does not know if he is taking it currently    Lab Results  Component Value Date   CHOL 268 (H) 11/21/2015   HDL 54.60 11/21/2015   LDLCALC 63 07/04/2013   LDLDIRECT 187.0 11/21/2015   TRIG 259.0 (H) 11/21/2015   CHOLHDL 5 11/21/2015           Hypertension:Currently treated with amlodipine 5 mg, diltiazem 240 mg, not on ACE inhibitor or ARB He takes his blood pressure daily with a wrist instrument and this is indicating blood pressure readings between about 071-219 systolic recently  BP Readings from Last 3 Encounters:  12/17/15 (!) 160/78  12/03/15 136/70  11/26/15 136/64     Most recent eye exam was 7/17, Dr Marina Gravel? Has had no retinopathy  Most recent foot exam: 12/17    LABS:  No visits with results within 1 Week(s) from this visit.    Latest known visit with results is:  Office Visit on 11/21/2015  Component Date Value Ref Range Status  . Cholesterol 11/21/2015 268* 0 - 200 mg/dL Final  . Triglycerides 11/21/2015 259.0* 0.0 - 149.0 mg/dL Final  . HDL 11/21/2015 54.60  >39.00 mg/dL Final  . VLDL 11/21/2015 51.8* 0.0 - 40.0 mg/dL Final  . Total CHOL/HDL Ratio 11/21/2015 5   Final  . NonHDL 11/21/2015 213.12   Final  . Hgb A1c MFr Bld 11/21/2015 10.4* 4.6 - 6.5 % Final  . Sodium 11/21/2015 136  135 - 145 mEq/L Final  . Potassium 11/21/2015 4.2  3.5 - 5.1 mEq/L Final  . Chloride 11/21/2015 99  96 - 112 mEq/L Final  . CO2 11/21/2015 26  19 - 32 mEq/L Final  . Glucose, Bld 11/21/2015 207* 70 - 99 mg/dL Final  . BUN 11/21/2015 15  6 - 23 mg/dL Final  . Creatinine, Ser 11/21/2015 0.83  0.40 - 1.50 mg/dL Final  . Calcium 11/21/2015 9.2  8.4 - 10.5 mg/dL Final  . GFR 11/21/2015 95.59  >60.00 mL/min Final  . Direct LDL 11/21/2015 187.0  mg/dL Final    Physical Examination:  BP (!) 160/78   Pulse 85   Ht 6' (1.829 m)   Wt 259 lb (117.5 kg)   SpO2 91%   BMI 35.13 kg/m   GENERAL:         Patient has generalized obesity.   HEENT:         Eye exam shows normal external appearance. Fundus exam shows no retinopathy. Oral exam shows normal mucosa .  NECK:   There is no lymphadenopathy Thyroid is not enlarged and no nodules felt.  Carotids are normal to palpation and no bruit heard LUNGS:         Chest is symmetrical. Lungs are clear to auscultation.[name]   HEART:         Heart sounds:  S1 and S2 are normal. No murmur or click heard., no S3 or S4.   ABDOMEN:   There is no distention present. Liver and spleen are not palpable. No other mass or tenderness present.   NEUROLOGICAL:   Ankle jerks are absent bilaterally.    Diabetic Foot Exam - Simple   Simple Foot Form Diabetic Foot exam was performed with the following findings:  Yes 12/17/2015 12:06 PM  Visual Inspection See comments:  Yes Sensation Testing See comments:   Yes Pulse Check See comments:  Yes Comments Healed superficial ulcer on the dorsum of the right second toe. Edema present  Absent pedal pulses Monofilament sensation normal in the distal toes on the right and decreased on the left except fifth toe            Vibration sense is Markedly reduced in distal first toes. MUSCULOSKELETAL:  There is no swelling or deformity of the peripheral joints. Spine is normal to inspection.   EXTREMITIES:     There is 1+ lower leg and 2+ pitting ankle edema. No skin lesions present, see foot exam SKIN:       No rash      ASSESSMENT:  Diabetes type 2, uncontrolled    See history of present illness for detailed discussion of current diabetes management, blood sugar patterns and problems identified Recent A1c is markedly increased at 10.4  Currently he is being managed with basal bolus insulin although most of his insulin is in the form of basal insulin with Levemir at night once a day His blood sugar patterns show SIGNIFICANTLY HIGH blood sugars at bedtime probably from a combination of inadequate duration of action of Levemir insulin and inadequate control of mealtime blood sugars These are averaging over 300 currently He is taking his regular insulin postprandially which is not appropriate also Has little knowledge about diet and meal planning Currently unable to exercise or lose weight  He has been on Actos for several years and not clear if this benefiting his control and his glucose is getting worse, probably from stopping metformin last year also Also appears to be gaining weight and having problems with edema which could be worse with taking Actos at the 45 mg dose  Complications of diabetes: NEUROPATHY with some decrease in peripheral sensation and history of paresthesiae He has had macrovascular disease with CAD Urine microalbumin needs to be rechecked when blood sugars are better controlled  HYPERLIPIDEMIA: He has marked hyperlipidemia and  history of CAD.  Most likely has not been taking his Lipitor prior to his labs last month However his LDL target should be 70 given his history of CAD  Other active problems include BPH, COPD, depression, history of cardiac arrhythmias and ? History of diastolic CHF with preserved EF  PLAN:     He will start checking blood sugars by rotation at different times of the day instead of just at bedtime and on waking up  He is a good candidate for medication like Jardiance with his history of poorly controlled diabetes, obesity, CAD and edema. Discussed action of SGLT 2 drugs on lowering glucose by decreasing kidney absorption of glucose, benefits of weight loss and lower blood pressure, possible side effects including candidiasis and dosage regimen   He also should have benefit from using metformin for insulin resistance as well as long-term benefits and for convenience will have him try Synjardy.  He will start with 05/998 once a day and an increase the dose to twice a day if tolerated.  Meanwhile he will reduce his amlodipine to half tablet  LEVEMIR: Unlikely that this is working 24 hours and he will split the dose to 40 units twice a day.  Discussed that he will possibly need less insulin with starting Jardiance especially higher doses and may need to reduce basal insulins if fasting readings a low-normal  Most likely needs more insulin to cover his meals but will be able to adjust better once he has more postprandial monitoring  Meanwhile increase his suppertime dose to 22 units  Reminded him to take REGULAR insulin at least 15 minutes before eating instead of  after  Follow-up with diabetes educator next week  Consultation with dietitian for meal planning  BLOOD pressure: He will need to verify that his home instrument is accurate and he will bring this for comparison   Patient Instructions  Check blood sugars on waking up 3-4x per week   Also check blood sugars about 2 hours after  a meal and do this after different meals by rotation including after breakfast and lunch   Check blood sugars about twice a day on an average  Recommended blood sugar levels on waking up is 90-130 and about 2 hours after meal is 130-160  Please bring your blood sugar monitor to each visit, thank you  LEVEMIR insulin: Change the dose to 40 units at breakfast and supper  NOVOLIN R: This needs to be taken about 15 minutes BEFORE eating Increase the dose at suppertime to 22 units for now  Call if having blood sugar drops  AMLODIPINE: Take half tablet daily since Jardiance will make your blood pressure comes down Check your blood pressure monitor with the doctors office for comparison  Make sure you're taking atorvastatin for cholesterol    Total visit time for reviewing current records, labs, management, multiple problems, counseling = 60 minutes  Consultation note has been sent to the referring physician  [facility] 12/17/2015, 4:46 PM   Note: This office note was prepared with Dragon voice recognition system technology. Any transcriptional errors that result from this process are unintentional.

## 2015-12-17 NOTE — Telephone Encounter (Signed)
RF request for bupropion LOV: 11/26/15 Next ov: 02/19/16 Last written: 06/10/15 #60 w/ 3RF

## 2015-12-18 ENCOUNTER — Encounter (HOSPITAL_BASED_OUTPATIENT_CLINIC_OR_DEPARTMENT_OTHER): Payer: Medicare Other | Attending: Surgery

## 2015-12-20 ENCOUNTER — Telehealth: Payer: Self-pay | Admitting: Pulmonary Disease

## 2015-12-20 NOTE — Telephone Encounter (Signed)
PA request for Trelegy Ellipta received PA initiated through [facility] Key: [facility]  Request pending- a determination is expected within 72 hours.  Forwarding to JJ to follow up on.

## 2015-12-24 ENCOUNTER — Encounter: Payer: Self-pay | Admitting: Family Medicine

## 2015-12-25 ENCOUNTER — Encounter: Payer: Medicare Other | Attending: Endocrinology | Admitting: Nutrition

## 2015-12-25 DIAGNOSIS — Z794 Long term (current) use of insulin: Secondary | ICD-10-CM | POA: Diagnosis not present

## 2015-12-25 DIAGNOSIS — E118 Type 2 diabetes mellitus with unspecified complications: Secondary | ICD-10-CM

## 2015-12-25 DIAGNOSIS — E1165 Type 2 diabetes mellitus with hyperglycemia: Secondary | ICD-10-CM | POA: Diagnosis not present

## 2015-12-25 DIAGNOSIS — Z713 Dietary counseling and surveillance: Secondary | ICD-10-CM | POA: Diagnosis not present

## 2015-12-25 NOTE — Telephone Encounter (Signed)
PA was looked up to check on the status of the PA and it stated that it had not been sent to the plan.  This has been completed and sent to the plan on 12/20.  Will follow up in the next few days.

## 2015-12-25 NOTE — Progress Notes (Addendum)
Pt. Is taking Levemire 40u q HS (9PM), and R insulin right before eating: 22u acB and acL. Per Dr. Ronnie Derby voice order that was reverbalized to him, he was told to take Levemir 40u twice daily, and 22u of R ac all meals.  Written instructions were given to him for this. He was also told to take the R insulin about 30 min. Before eating his meals.  He agreed to do this.   A quick review of diet shows that he is eating cereal 5 mornings per week with a sandwich.  He was given other suggestions for breakfast meals.  He agreed to stop the cereal and milk.  He will call me in 2 weeks with blood sugar readings.   Mr. Bartelt brought in his wrist BP meter.  His read 124/70, and mine read 154/60.  He retested his meter, and it read 122/64.  He was told to bring back this meter to retest it when he sees Dr. Dwyane Dee in 2 weeks.

## 2015-12-25 NOTE — Patient Instructions (Signed)
Take Levemir 40u twice a day.   Take R 22u 30 min. Before all meals. Stop cold cereal and milk for breakfast.

## 2015-12-26 DIAGNOSIS — J449 Chronic obstructive pulmonary disease, unspecified: Secondary | ICD-10-CM | POA: Diagnosis not present

## 2015-12-26 NOTE — Telephone Encounter (Signed)
Checked in cover my meds and this stated that the trelegy was denied and we will need to do an appeal or change to another medication.  PM please advise. thanks

## 2016-01-02 NOTE — Telephone Encounter (Signed)
We can try Breo and Anoro instead of the trelegy

## 2016-01-02 NOTE — Telephone Encounter (Signed)
ATC pt--no vm set up

## 2016-01-03 NOTE — Telephone Encounter (Signed)
Patient returned phone call.Marland KitchenMarland KitchenContact # [phone].Joshua [name]

## 2016-01-03 NOTE — Telephone Encounter (Signed)
Lm with pt's wife to have pt return our call

## 2016-01-03 NOTE — Telephone Encounter (Signed)
Before we call them back, need to know what strength of the Breo to call in  Please advise, thanks!

## 2016-01-06 DIAGNOSIS — I255 Ischemic cardiomyopathy: Secondary | ICD-10-CM

## 2016-01-06 DIAGNOSIS — N183 Chronic kidney disease, stage 3 unspecified: Secondary | ICD-10-CM

## 2016-01-06 HISTORY — DX: Ischemic cardiomyopathy: I25.5

## 2016-01-06 HISTORY — DX: Chronic kidney disease, stage 3 unspecified: N18.30

## 2016-01-09 ENCOUNTER — Encounter: Payer: Self-pay | Admitting: Dietician

## 2016-01-09 ENCOUNTER — Encounter: Payer: Medicare Other | Attending: Endocrinology | Admitting: Dietician

## 2016-01-09 DIAGNOSIS — Z794 Long term (current) use of insulin: Secondary | ICD-10-CM | POA: Insufficient documentation

## 2016-01-09 DIAGNOSIS — E1165 Type 2 diabetes mellitus with hyperglycemia: Secondary | ICD-10-CM | POA: Insufficient documentation

## 2016-01-09 DIAGNOSIS — Z713 Dietary counseling and surveillance: Secondary | ICD-10-CM | POA: Diagnosis not present

## 2016-01-09 DIAGNOSIS — E118 Type 2 diabetes mellitus with unspecified complications: Secondary | ICD-10-CM

## 2016-01-09 NOTE — Patient Instructions (Addendum)
Increase your intake of non starchy vegetables. Bake, broil or grill most often rather than frying. Avoid adding much fat to your foods. Buy lean meat. Continue to choose other options for breakfast rather than cereal most often. Continue to avoid concentrated sources of sugar. Continue to avoid added salt. Choose fresh meat rather than processed or frozen.  Plan:  Aim for 3 Carb Choices per meal (45 grams) +/- 1 either way  Aim for 0-1 Carbs per snack if hungry  Include protein in moderation with your meals and snacks Consider reading food labels for Total Carbohydrate and Fat Grams of foods Consider  increasing your activity level by armchair exercises or walking for 15 minutes daily as tolerated to 30 minutes per day. Consider checking BG at alternate times per day as directed by MD  Consider taking medication as directed by MD

## 2016-01-09 NOTE — Progress Notes (Signed)
Medical Nutrition Therapy:  Appt start time: 1610 end time:  9604.   Assessment:  Primary concerns today: Patient is here today with his wife.  He does not know what he wants to learn and feels like he will not like my recommendations.   Hx includes:  Type 2 diabetes since about 2002 and on insulin since 2015, HTN, hyperlipidemia, COPD (previous smoker and denies current use), and CKD.  Labs include A1C 10.4% 11/21/15 increased from 9.2% 08/21/15, cholesterol 268, HDL 54, LDL 187, Triglycerides 259.  Weight today 254 lbs.  Weight this am at home without clothes 248 lbs and states that this is down from 255 lbs from 2 weeks ago.  Patient states that he believes that the weight loss is from fluid loss.  Patient lives with his wife and sister-in-law.  Wife does the shopping and cooking.  Patient is retired.  He used to be a bus Geophysicist/field seismologist.  Preferred Learning Style:   No preference indicated   Learning Readiness:   Contemplating   MEDICATIONS:  See list to include SynJardy, Levemir 40 units bid, Novolin 24 units before each meal, and actose.  He takes the Levemir at about 5:30 am and at 9:45 pm.  He goes to bed early and wakes up to take this second dose.   DIETARY INTAKE:  Usual eating pattern includes 3 meals and 0-1 snacks per day.  Everyday foods include stevia, Mortens lite salt.  Avoided foods include dessert and other things with sugar.    24-hr recall:  B (5:30AM): eggs, 1-2 cakes of sausage, grits or oatmeal, 1 slice white wheat toast with butter, coffee with non dairy creamer  Snk ( AM): occasional NABS  L (11:30-12 PM): oyster soup, crackers OR leftovers OR sandwich Snk ( PM): rare D (5-6PM): spaghetti with meat sauce, 1 slice garlic bread Snk ( PM): none Beverages: coffee with non dairy creamer, diet coke, water, occasional diet green tea  Usual physical activity: none  Estimated energy needs: 1600 calories 180 g carbohydrates 100 g protein 53 g fat  Progress Towards  Goal(s):  In progress.   Nutritional Diagnosis:  NB-1.1 Food and nutrition-related knowledge deficit As related to balance of carbohydrate, protein, and fat.  As evidenced by diet hx and patient report.    Intervention:  Nutrition counseling and diabetes education initiated. Discussed Carb Counting by food group as method of portion control, reading food labels, and benefits of increased activity. Also discussed basic physiology of Diabetes, target BG ranges pre and post meals, and A1c. Also instructed patient on reducing sodium.  He reports recently being taken off of his Potassium supplement.  He just started using NoSalt (KCL) instead of salt.  Discussed that he should discuss the use with MD.  Caution stated due to potential risk to increase potassium.  Increase your intake of non starchy vegetables. Bake, broil or grill most often rather than frying. Avoid adding much fat to your foods. Buy lean meat. Continue to choose other options for breakfast rather than cereal most often. Continue to avoid concentrated sources of sugar. Continue to avoid added salt. Choose fresh meat rather than processed or frozen.  Plan:  Aim for 3 Carb Choices per meal (45 grams) +/- 1 either way  Aim for 0-1 Carbs per snack if hungry  Include protein in moderation with your meals and snacks Consider reading food labels for Total Carbohydrate and Fat Grams of foods Consider  increasing your activity level by armchair exercises or walking for  15 minutes daily as tolerated to 30 minutes per day. Consider checking BG at alternate times per day as directed by MD  Consider taking medication as directed by MD  Teaching Method Utilized:  Visual Auditory Hands on  Handouts given during visit include:  Living well with diabetes  A1C sheet  Label reading  Snack list  Meal plan card  Arm chair exercises  Barriers to learning/adherence to lifestyle change: limited mobility, desire to change at  times.  Demonstrated degree of understanding via:  Teach Back   Monitoring/Evaluation:  Dietary intake, exercise, label reading, and body weight prn.

## 2016-01-14 MED ORDER — FLUTICASONE FUROATE-VILANTEROL 200-25 MCG/INH IN AEPB: 1.0000 | INHALATION_SPRAY | Freq: Every day | RESPIRATORY_TRACT | 6 refills | Status: DC

## 2016-01-14 NOTE — Telephone Encounter (Signed)
Call in Ubly 200/25 thanks.

## 2016-01-14 NOTE — Telephone Encounter (Signed)
Spoke with patient wife, aware that Breo 200 is being sent to the pharmacy, explained how to use this inhaler and advised that they ask the Pharmacist to show them as well. Take 1 puff daily, rinse mouth after use. Nothing further needed.

## 2016-01-15 ENCOUNTER — Encounter: Payer: Self-pay | Admitting: Endocrinology

## 2016-01-15 ENCOUNTER — Ambulatory Visit (INDEPENDENT_AMBULATORY_CARE_PROVIDER_SITE_OTHER): Payer: Medicare Other | Admitting: Endocrinology

## 2016-01-15 VITALS — BP 130/70 | HR 81 | Wt 257.0 lb

## 2016-01-15 DIAGNOSIS — E782 Mixed hyperlipidemia: Secondary | ICD-10-CM

## 2016-01-15 DIAGNOSIS — I10 Essential (primary) hypertension: Secondary | ICD-10-CM | POA: Diagnosis not present

## 2016-01-15 DIAGNOSIS — Z794 Long term (current) use of insulin: Secondary | ICD-10-CM | POA: Diagnosis not present

## 2016-01-15 DIAGNOSIS — E119 Type 2 diabetes mellitus without complications: Secondary | ICD-10-CM | POA: Diagnosis not present

## 2016-01-15 DIAGNOSIS — E1165 Type 2 diabetes mellitus with hyperglycemia: Secondary | ICD-10-CM | POA: Diagnosis not present

## 2016-01-15 DIAGNOSIS — H353132 Nonexudative age-related macular degeneration, bilateral, intermediate dry stage: Secondary | ICD-10-CM | POA: Diagnosis not present

## 2016-01-15 NOTE — Patient Instructions (Signed)
LVEMIR 45 IN AM AND 25 IN PM  SUPPER DOSE OF NOVOLIN R GO TO 30 UNITS  Check blood sugars on waking up  3-4X WEEKLY  Also check blood sugars about 2 hours after a meal and do this after different meals by rotation  Recommended blood sugar levels on waking up is 90-130 and about 2 hours after meal is 130-160  Please bring your blood sugar monitor to each visit, thank you

## 2016-01-15 NOTE — Progress Notes (Signed)
Patient ID: Joshua [name], male   DOB: [birth year], 76 y.o.   MRN: [id number]            Reason for Appointment: Consultation for Type 2 Diabetes  Referring physician: McGowen   History of Present Illness:          Date of diagnosis of type 2 diabetes mellitus: ?  2002        Background history:   He apparently had been treated with metformin initially which was continued until about 2016. He previously has also been tried on Amaryl, Actos, Farxiga and Victoza but he claims that these did not help his blood sugar and were stopped Appears to have been on insulin since about 2014 but he does not remember details. He has been taking mostly Levemir insulin as a basal insulin but was also on Lantus in 2015 for some time His best A1c has been 7.3, once in 2014 and another time in 2016, otherwise his A1c has been higher and as high as 10.8  Recent history:   INSULIN regimen is:   Levemir 40 units at bid, Novolin R 24 tid units before meals or pc meals      Non-insulin hypoglycemic drugs the patient is taking are: Actos 45 mg daily, Synjardy 05/998 twice a day  Recommendations made on his initial consultation: Levemir changed to 40 units before breakfast and supper Regular insulin to be taken 15 minutes before eating and to take 22 units at suppertime Start Synjardy once a day and if no side effects increased to twice a day Consultation with dietitian and nurse educator  Current management, blood sugar patterns and problems identified:  Blood sugars from review of his monitor showed the following:  Recent fasting readings ranging from 93-149  Bedtime 244-315 with one reading of 142; early evening 248  Prior to the above changes his blood sugars were averaging 279 at home  He has had no difficulty tolerating the Synjardy and is taking this twice a day.  On his own he has gone up on his regular insulin by 3-5 units since his last visit  However blood sugars appear to be  persistently high after supper; knees also not checking readings at lunchtime or suppertime usually  He is not able to exercise because of pain in his legs and difficulty with balance.  He has just seen the dietitian last week and is starting to make some changes  Although he was reminded by the nurse educator to take his regular insulin before eating and may not always remember to do this  No hypoglycemia          Side effects from medications have been: None  Compliance with the medical regimen: Fair Hypoglycemia: Never    Glucose monitoring:  done  times a day         Glucometer:  Accu-Chek .      Blood Glucose readings as above   Self-care: The diet that the patient has been following is: tries to limit Sweet drinks .     Meal times are:  Breakfast is at 7 a.m.  Dinner: 5-6   Typical meal intake: Breakfast is usually eggs,Sausage, oatmeal or grits.  Lunch is a sandwich, any evening he has meat and vegetables, For snacks he will have fruit or crackers                Dietician visit, most recent: 01/09/16 CDE visit: 12/18  Exercise: Unable to do any     Weight history:  Wt Readings from Last 3 Encounters:  01/15/16 257 lb (116.6 kg)  01/09/16 254 lb (115.2 kg)  12/17/15 259 lb (117.5 kg)    Glycemic control:   Lab Results  Component Value Date   HGBA1C 10.4 (H) 11/21/2015   HGBA1C 9.2 (H) 08/21/2015   HGBA1C 8.8 (H) 05/17/2015   Lab Results  Component Value Date   MICROALBUR 4.6 (H) 12/17/2014   LDLCALC 63 07/04/2013   CREATININE 0.83 11/21/2015   Lab Results  Component Value Date   MICRALBCREAT 6.4 12/17/2014       Allergies as of 01/15/2016      Reactions   Morphine And Related Other (See Comments)   Drenched with perspiration   Demerol [meperidine] Nausea Only   Starlix [nateglinide] Other (See Comments)   gassy      Medication List       Accurate as of 01/15/16  9:17 PM. Always use your most recent med list.            acetaminophen 325 MG tablet Commonly known as:  TYLENOL Take 2 tablets (650 mg total) by mouth every 4 (four) hours as needed for headache or mild pain.   albuterol 108 (90 Base) MCG/ACT inhaler Commonly known as:  PROVENTIL HFA;VENTOLIN HFA Inhale 2 puffs into the lungs every 6 (six) hours as needed for wheezing or shortness of breath.   PROAIR HFA 108 (90 Base) MCG/ACT inhaler Generic drug:  albuterol INHALE 2 PUFFS INTO THE LUNGS EVERY 4 HOURS AS NEEDED FOR WHEEZING ORSHORTNESS OF BREATH   amLODipine 5 MG tablet Commonly known as:  NORVASC Take 1 tablet (5 mg total) by mouth daily.   aspirin 81 MG tablet Take 81 mg by mouth at bedtime. Reported on 02/26/2015   atorvastatin 40 MG tablet Commonly known as:  LIPITOR Take 1 tablet (40 mg total) by mouth daily.   budesonide-formoterol 160-4.5 MCG/ACT inhaler Commonly known as:  SYMBICORT Inhale 2 puffs into the lungs 2 (two) times daily.   buPROPion 150 MG 12 hr tablet Commonly known as:  WELLBUTRIN SR TAKE ONE TABLET BY MOUTH TWICE DAILY   Chromium Picolinate 1000 MCG Tabs Take 1,000 mcg by mouth daily. Reported on 02/26/2015   citalopram 40 MG tablet Commonly known as:  CELEXA Take 1 tablet (40 mg total) by mouth daily.   diltiazem 240 MG 24 hr capsule Commonly known as:  CARDIZEM CD Take 1 capsule (240 mg total) by mouth daily.   Empagliflozin-Metformin HCl 05-998 MG Tabs Commonly known as:  SYNJARDY Take 1 tablet by mouth 2 (two) times daily after a meal.   fluticasone 50 MCG/ACT nasal spray Commonly known as:  FLONASE Place 2 sprays into both nostrils daily.   fluticasone furoate-vilanterol 200-25 MCG/INH Aepb Commonly known as:  BREO ELLIPTA Inhale 1 puff into the lungs daily.   Fluticasone-Umeclidin-Vilant 100-62.5-25 MCG/INH Aepb Commonly known as:  TRELEGY ELLIPTA Inhale 1 puff into the lungs daily.   furosemide 40 MG tablet Commonly known as:  LASIX Take 40 mg by mouth daily.   gabapentin 100 MG  capsule Commonly known as:  NEURONTIN TAKE 2 CAPSULES BY MOUTH 3 TIMES DAILY   guaiFENesin 600 MG 12 hr tablet Commonly known as:  MUCINEX Take 2 tablets (1,200 mg total) by mouth 2 (two) times daily.   Insulin Detemir 100 UNIT/ML Pen Commonly known as:  LEVEMIR FLEXTOUCH Inject 80 Units into the  skin at bedtime.  Change site of injection with every use.   Insulin Pen Needle 32G X 5 MM Misc Use to inject insulin TID as directed by physician.   insulin regular 250 units/2.75m (100 units/mL) injection Commonly known as:  NOVOLIN R,HUMULIN R Inject 16 units SQ with BF, 17 units SQ with Lunch, and 18 U SQ with Supper   isosorbide mononitrate 30 MG 24 hr tablet Commonly known as:  IMDUR Take 1 tablet by mouth  daily   multivitamin with minerals Tabs tablet Take 1 tablet by mouth daily.   nitroGLYCERIN 0.4 MG SL tablet Commonly known as:  NITROSTAT Place 1 tablet (0.4 mg total) under the tongue every 5 (five) minutes as needed for chest pain.   pioglitazone 45 MG tablet Commonly known as:  ACTOS Take 45 mg by mouth daily. Reported on 02/26/2015   rivaroxaban 20 MG Tabs tablet Commonly known as:  XARELTO Take 1 tablet (20 mg total) by mouth daily with supper.   tamsulosin 0.4 MG Caps capsule Commonly known as:  FLOMAX TAKE 1 CAPSULE BY MOUTH EVERY DAY AFTER SUPPER   vitamin C 1000 MG tablet Take 1,000 mg by mouth daily.   Vitamin D-3 1000 units Caps Take 1,000 Units by mouth daily. Reported on 02/26/2015       Allergies:  Allergies  Allergen Reactions  . Morphine And Related Other (See Comments)    Drenched with perspiration  . Demerol [Meperidine] Nausea Only  . Starlix [Nateglinide] Other (See Comments)    gassy    Past Medical History:  Diagnosis Date  . Adenomatous colon polyp 10/16/2011   Repeat 2018  . Arthritis     Hips, R>L & KNEES  . CAD, multiple vessel    3V CAD cath 08/08/13----CABG done shortly after.  . 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 pylori neg, no dysplasia or malignancy.  . Chronic diastolic heart failure ([facility] 2016  . Chronic renal insufficiency, stage II (mild)   . COPD (chronic obstructive pulmonary disease) ([place]    GOLD II.  Spirometry  2004 borderline obstruction; 2015 mod obst: noncompliant with bid symbicort so pulmonologist switched him to a once daily inhaler: Trelegy ellipta 12/2015.  . Diabetic nephropathy ([place]    Elevated urine microalb/cr 03/2011  . DM type 2 (diabetes mellitus, type 2) (HCC)    Poor control on max oral meds--pt eventually agreed to insulin therap  . DOE (dyspnea on exertion)    COPD + chronic diastolic HF  . Erectile dysfunction    Normal testosterone  . Hearing loss of both ears 2016   Hearing aids  . Hyperlipidemia   . Hyperplastic colon polyp 2001  . Hypertension   . Iron deficiency anemia 2014   03/2012 capsule endoscopy showed 2 AVMs--likely responsible for his IDA--lifetime iron supp recommended + [id number].  . Macular degeneration, dry    Mild, bilat (Optometrist, [place] [employer] [place] . Obesity   . Open toe wound 11/2015   Wound care clinic appt made and then canceled when toe improved.  . Other and unspecified angina pectoris   . PAF (paroxysmal atrial fibrillation) ([facility]/2016   xarelto  . Pericardial effusion with cardiac tamponade 6//29/15   pericardiocentesis was done,  Infectious/inflamm (cytology showed NO MALIGNANT CELLS)  . Pneumonia   . Tobacco dependence in remission    100+ pack-yr hx: quit after CABG    Past Surgical History:  Procedure Laterality Date  .  APPENDECTOMY  1957  . CARDIAC CATHETERIZATION  08/08/2013  . CATARACT EXTRACTION W/ INTRAOCULAR LENS  IMPLANT, BILATERAL  04/08/2006 & 04/22/2006  . CATARACT EXTRACTION W/ INTRAOCULAR LENS  IMPLANT, BILATERAL Bilateral   . CHOLECYSTECTOMY OPEN  1999  . COLONOSCOPY  10/16/2011   Procedure: COLONOSCOPY;  Surgeon: Inda Castle,  MD;  Location: [facility];  Service: Endoscopy;  Laterality: N/A;  . CORONARY ARTERY BYPASS GRAFT N/A 08/14/2013   Procedure: CORONARY ARTERY BYPASS GRAFTING (CABG);  Surgeon: Melrose Nakayama, MD;  Location: [facility];  Service: Open Heart Surgery;  Laterality: N/A;  Times 4   using left internal mammary artery and endoscopically harvested bilateral saphenous vein  . ESOPHAGOGASTRODUODENOSCOPY  02/25/12   Atrophic gastritis with a few erosions--capsule endoscopy planned as of 02/25/12 (Dr. Deatra Ina).  [name] HARDWARE REMOVAL Right 08/12/2012   Procedure: HARDWARE REMOVAL;  Surgeon: Mcarthur Rossetti, MD;  Location: [facility];  Service: Orthopedics;  Laterality: Right;  . HIP SURGERY Right 1954   Repair of slipped capital femoral epiphysis.  . INTRAOPERATIVE TRANSESOPHAGEAL ECHOCARDIOGRAM N/A 08/14/2013   Normal LV function. Procedure: INTRAOPERATIVE TRANSESOPHAGEAL ECHOCARDIOGRAM;  Surgeon: Melrose Nakayama, MD;  Location: [facility];  Service: Open Heart Surgery;  Laterality: N/A;  . LEFT HEART CATHETERIZATION WITH CORONARY ANGIOGRAM N/A 08/08/2013   Procedure: LEFT HEART CATHETERIZATION WITH CORONARY ANGIOGRAM;  Surgeon: Jettie Booze, MD;  Location: [facility] CATH LAB;  Service: Cardiovascular;  Laterality: N/A;  . PATELLA FRACTURE SURGERY Left ~ 1979   bolt + 3 screws to repair tib plateau fx  . PERICARDIAL TAP N/A 07/01/2013   Procedure: PERICARDIAL TAP;  Surgeon: Jettie Booze, MD;  Location: [facility] C[facility];  Service: Cardiovascular;  Laterality: N/A;  . TESTICLE SURGERY  as a child   Undescended testicle brought down into scrotum  . TONSILLECTOMY  1947  . TOTAL HIP ARTHROPLASTY Right 08/12/2012   Procedure: REMOVAL OF OLD PINS RIGHT HIP AND RIGHT TOTAL HIP ARTHROPLASTY ANTERIOR APPROACH;  Surgeon: Mcarthur Rossetti, MD;  Location: [facility];  Service: Orthopedics;  Laterality: Right;  . TRANSTHORACIC ECHOCARDIOGRAM  10/30/14   Mod LVH, EF 60-65%, normal wall motion, mod mitral regurg, mild PAH     Family History  Problem Relation Age of Onset  . Heart disease Father   . Heart attack Father   . CVA Mother   . Hypertension Mother   . Diabetes Paternal Grandmother   . Breast cancer Sister   . Diabetes Maternal Uncle   . Heart disease Maternal Uncle   . Stroke Neg Hx     Social History:  reports that he quit smoking about 2 months ago. His smoking use included Cigarettes. He has a 15.50 pack-year smoking history. He quit smokeless tobacco use about 2 years ago. He reports that he does not drink alcohol or use drugs.   Review of Systems   Lipid history: He had been prescribed Lipitor previously  and has been prescribed this again in November by PCP, does not appear to be taking this now    Lab Results  Component Value Date   CHOL 268 (H) 11/21/2015   HDL 54.60 11/21/2015   LDLCALC 63 07/04/2013   LDLDIRECT 187.0 11/21/2015   TRIG 259.0 (H) 11/21/2015   CHOLHDL 5 11/21/2015           Hypertension:Currently treated with amlodipine 5 mg, diltiazem 240 mg, not on ACE inhibitor or ARB He takes his blood pressure With his recent instrument but this was reading  lower day in the office instrument when seen by nurse educator    BP Readings from Last 3 Encounters:  01/15/16 130/70  12/17/15 (!) 160/78  12/03/15 136/70     Most recent eye exam was 7/17, Dr Marina Gravel? Has had no retinopathy  Most recent foot exam: 12/17    LABS:  No visits with results within 1 Week(s) from this visit.  Latest known visit with results is:  Office Visit on 11/21/2015  Component Date Value Ref Range Status  . Cholesterol 11/21/2015 268* 0 - 200 mg/dL Final  . Triglycerides 11/21/2015 259.0* 0.0 - 149.0 mg/dL Final  . HDL 11/21/2015 54.60  >39.00 mg/dL Final  . VLDL 11/21/2015 51.8* 0.0 - 40.0 mg/dL Final  . Total CHOL/HDL Ratio 11/21/2015 5   Final  . NonHDL 11/21/2015 213.12   Final  . Hgb A1c MFr Bld 11/21/2015 10.4* 4.6 - 6.5 % Final  . Sodium 11/21/2015 136  135 - 145 mEq/L  Final  . Potassium 11/21/2015 4.2  3.5 - 5.1 mEq/L Final  . Chloride 11/21/2015 99  96 - 112 mEq/L Final  . CO2 11/21/2015 26  19 - 32 mEq/L Final  . Glucose, Bld 11/21/2015 207* 70 - 99 mg/dL Final  . BUN 11/21/2015 15  6 - 23 mg/dL Final  . Creatinine, Ser 11/21/2015 0.83  0.40 - 1.50 mg/dL Final  . Calcium 11/21/2015 9.2  8.4 - 10.5 mg/dL Final  . GFR 11/21/2015 95.59  >60.00 mL/min Final  . Direct LDL 11/21/2015 187.0  mg/dL Final    Physical Examination:  BP 130/70 (BP Location: Left Arm, Patient Position: Sitting)   Pulse 81   Wt 257 lb (116.6 kg)   SpO2 91%   BMI 35.34 kg/m       ASSESSMENT:  Diabetes type 2, uncontrolled    See history of present illness for detailed discussion of current diabetes management, blood sugar patterns and problems identified A1c prior to consultation 10.4  With splitting his Levemir to twice a day, starting Synjardy and increasing his time insulin his blood sugars are improving However they appear to be still high after his evening meal but not clear if they are higher at lunch or supper as he is not monitoring more than twice a day and only at fasting and bedtime Still has difficulty remembering to take his regular insulin before meals Unable to exercise However he is using Jardiance his weight is stable and down about 2 pounds  HYPERTENSION: Blood pressure is fairly good even with no changes in his amlodipine as directed with starting Jardiance   HYPERLIPIDEMIA: He has marked hyperlipidemia as of 11/17 and history of CAD.  Needs follow-up to see if he is compliant with Lipitor   PLAN:     He will start checking blood sugars by rotation at different times of the day instead of just at bedtime and on waking up  He will try to take his regular insulin more consistently before eating rather than after  Since blood sugars are probably higher towards evening he can take 45 Levemir in the morning and 30 5 in the evening  Increase  suppertime regular insulin to 30 units for now and also discussed adjustment based on readings at night and also a need adjustment based on his meals  May also consider V-go pump although he is taking relatively large amount of insulin for now  He will try to follow the instructions given by dietitian Needs follow-up in labs in 6  weeks, may get labs drawn at his PCP office  Patient Instructions  LVEMIR 45 IN AM AND 25 IN PM  SUPPER DOSE OF NOVOLIN R GO TO 30 UNITS  Check blood sugars on waking up  3-4X WEEKLY  Also check blood sugars about 2 hours after a meal and do this after different meals by rotation  Recommended blood sugar levels on waking up is 90-130 and about 2 hours after meal is 130-160  Please bring your blood sugar monitor to each visit, thank you    Counseling time on subjects discussed above is over 50% of today's 25 minute visit   Theus Espin 01/15/2016, 9:17 PM   Note: This office note was prepared with Estate agent. Any transcriptional errors that result from this process are unintentional.

## 2016-01-16 ENCOUNTER — Ambulatory Visit: Payer: Self-pay | Admitting: *Deleted

## 2016-01-21 ENCOUNTER — Encounter (HOSPITAL_BASED_OUTPATIENT_CLINIC_OR_DEPARTMENT_OTHER): Payer: Medicare Other

## 2016-01-21 DIAGNOSIS — E118 Type 2 diabetes mellitus with unspecified complications: Secondary | ICD-10-CM | POA: Diagnosis not present

## 2016-01-24 ENCOUNTER — Encounter: Payer: Self-pay | Admitting: *Deleted

## 2016-01-24 ENCOUNTER — Other Ambulatory Visit: Payer: Self-pay | Admitting: *Deleted

## 2016-01-24 NOTE — Patient Outreach (Signed)
Telephone assessment. Pt is doing very well. Mr. And Mrs. Beck had a good holiday season. Mr. Hollar's toe wound is healed. He has received his new diabetic shoes. He says they are comfortable and his toes have plenty of room. He has been attending some nutrition counseling sessions and is seeing an endocrinologist now.  I have advised Mr. Baney I am going to close his case now. I have encouraged him to call me in the future if he has needs.   Deloria Lair [facility] [place] [phone]

## 2016-01-26 DIAGNOSIS — J449 Chronic obstructive pulmonary disease, unspecified: Secondary | ICD-10-CM | POA: Diagnosis not present

## 2016-01-31 ENCOUNTER — Ambulatory Visit (INDEPENDENT_AMBULATORY_CARE_PROVIDER_SITE_OTHER): Payer: Medicare Other | Admitting: Interventional Cardiology

## 2016-01-31 ENCOUNTER — Encounter: Payer: Self-pay | Admitting: Interventional Cardiology

## 2016-01-31 VITALS — BP 118/56 | HR 80 | Ht 71.5 in | Wt 254.0 lb

## 2016-01-31 DIAGNOSIS — I10 Essential (primary) hypertension: Secondary | ICD-10-CM

## 2016-01-31 DIAGNOSIS — I480 Paroxysmal atrial fibrillation: Secondary | ICD-10-CM

## 2016-01-31 DIAGNOSIS — Z951 Presence of aortocoronary bypass graft: Secondary | ICD-10-CM

## 2016-01-31 DIAGNOSIS — I2510 Atherosclerotic heart disease of native coronary artery without angina pectoris: Secondary | ICD-10-CM

## 2016-01-31 DIAGNOSIS — E782 Mixed hyperlipidemia: Secondary | ICD-10-CM | POA: Diagnosis not present

## 2016-01-31 MED ORDER — NITROGLYCERIN 0.4 MG SL SUBL: 0.4000 mg | SUBLINGUAL_TABLET | SUBLINGUAL | 3 refills | Status: DC | PRN

## 2016-01-31 NOTE — Progress Notes (Signed)
Patient ID: Joshua [name], male   DOB: 08/30/1940, 76 y.o.   MRN: [id number]     Cardiology Office Note   Date:  01/31/2016   ID:  Bianca, Vester 1940-04-07, MRN [id number]  PCP:  Tammi Sou, MD    No chief complaint on file. f/u CAD   Wt Readings from Last 3 Encounters:  01/31/16 254 lb (115.2 kg)  01/15/16 257 lb (116.6 kg)  01/09/16 254 lb (115.2 kg)       History of Present Illness: Joshua [name] is a 76 y.o. male  who has had pericarditis in June 2015. He underwent cardiac cath after this time and had three-vessel coronary artery disease in August 2015. He underwent bypass surgery at that time. He recovered well he denies any chest discomfort. He feels that his energy level is low.  SL NTG has not been used.  His exercise is limited by [facility].  He quit smoking fully in October 2016.  He started back smoking.  He stopped again in October 2017.  He has used Chantix and Wellbutrin.  He does not walk very much. His wife states that his knees don't allow him to walk. He has gained weight since his surgery. At the time of his bypass, he was 203 pounds. He eats frequently during the day. He eats 3 full meals and 3 snacks. He eats late at night. He does not eat a lot of sweets. He denies eating a lot of starchy foods. He tries to have only wheat bread. He stays away from sweets but he does eat a lot of canned peaches.  He has been hospitalized several times in the past few years. At one point in October 2016, he had paroxysmal atrial fibrillation and was started on anticoagulation.  No bleeding problems or palpitations recently.    He admits diet can improve.  Walking to the table is his most strenuous exercise.  DM control has improved per his report.   He reports difficulty emptying fully when urinating. He takes Flomax but has not noticed a difference.       Past Medical History:  Diagnosis Date  . Adenomatous colon polyp 10/16/2011   Repeat 2018  . Arthritis    Hips, R>L & KNEES  . CAD, multiple vessel    3V CAD cath 08/08/13----CABG done shortly after.  . 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 pylori neg, no dysplasia or malignancy.  . Chronic diastolic heart failure ([place]) 2016  . Chronic renal insufficiency, stage II (mild)   . COPD (chronic obstructive pulmonary disease) ([place])    GOLD II.  Spirometry  2004 borderline obstruction; 2015 mod obst: noncompliant with bid symbicort so pulmonologist switched him to a once daily inhaler: Trelegy ellipta 12/2015.  . Diabetic nephropathy ([place])    Elevated urine microalb/cr 03/2011  . DM type 2 (diabetes mellitus, type 2) ([facility])    Poor control on max oral meds--pt eventually agreed to insulin therap  . DOE (dyspnea on exertion)    COPD + chronic diastolic HF  . Erectile dysfunction    Normal testosterone  . Hearing loss of both ears 2016   Hearing aids  . Hyperlipidemia   . Hyperplastic colon polyp 2001  . Hypertension   . Iron deficiency anemia 2014   03/2012 capsule endoscopy showed 2 AVMs--likely responsible for his IDA--lifetime iron supp recommended + q24moCBCs.  . Macular degeneration, dry    Mild, bilat (Optometrist, DMayford Knife  at [facility] [facility] [place] in [place], [place])  . Obesity   . Open toe wound 11/2015   Wound care clinic appt made and then canceled when toe improved.  . Other and unspecified angina pectoris   . PAF (paroxysmal atrial fibrillation) ([place]) 10/2014   xarelto  . Pericardial effusion with cardiac tamponade 6//29/15   pericardiocentesis was done,  Infectious/inflamm (cytology showed NO MALIGNANT CELLS)  . Pneumonia   . Tobacco dependence in remission    100+ pack-yr hx: quit after CABG    Past Surgical History:  Procedure Laterality Date  . APPENDECTOMY  1957  . CARDIAC CATHETERIZATION  08/08/2013  . CATARACT EXTRACTION W/ INTRAOCULAR LENS  IMPLANT, BILATERAL  04/08/2006 & 04/22/2006  . CATARACT EXTRACTION W/ INTRAOCULAR LENS   IMPLANT, BILATERAL Bilateral   . CHOLECYSTECTOMY OPEN  1999  . COLONOSCOPY  10/16/2011   Procedure: COLONOSCOPY;  Surgeon: Inda Castle, MD;  Location: [facility];  Service: Endoscopy;  Laterality: N/A;  . CORONARY ARTERY BYPASS GRAFT N/A 08/14/2013   Procedure: CORONARY ARTERY BYPASS GRAFTING (CABG);  Surgeon: Melrose Nakayama, MD;  Location: [facility];  Service: Open Heart Surgery;  Laterality: N/A;  Times 4   using left internal mammary artery and endoscopically harvested bilateral saphenous vein  . ESOPHAGOGASTRODUODENOSCOPY  02/25/12   Atrophic gastritis with a few erosions--capsule endoscopy planned as of 02/25/12 (Dr. Deatra Ina).  [name] HARDWARE REMOVAL Right 08/12/2012   Procedure: HARDWARE REMOVAL;  Surgeon: Mcarthur Rossetti, MD;  Location: [facility];  Service: Orthopedics;  Laterality: Right;  . HIP SURGERY Right 1954   Repair of slipped capital femoral epiphysis.  . INTRAOPERATIVE TRANSESOPHAGEAL ECHOCARDIOGRAM N/A 08/14/2013   Normal LV function. Procedure: INTRAOPERATIVE TRANSESOPHAGEAL ECHOCARDIOGRAM;  Surgeon: Melrose Nakayama, MD;  Location: [facility];  Service: Open Heart Surgery;  Laterality: N/A;  . LEFT HEART CATHETERIZATION WITH CORONARY ANGIOGRAM N/A 08/08/2013   Procedure: LEFT HEART CATHETERIZATION WITH CORONARY ANGIOGRAM;  Surgeon: Jettie Booze, MD;  Location: [facility] CATH LAB;  Service: Cardiovascular;  Laterality: N/A;  . PATELLA FRACTURE SURGERY Left ~ 1979   bolt + 3 screws to repair tib plateau fx  . PERICARDIAL TAP N/A 07/01/2013   Procedure: PERICARDIAL TAP;  Surgeon: Jettie Booze, MD;  Location: [facility] CATH LAB;  Service: Cardiovascular;  Laterality: N/A;  . TESTICLE SURGERY  as a child   Undescended testicle brought down into scrotum  . TONSILLECTOMY  1947  . TOTAL HIP ARTHROPLASTY Right 08/12/2012   Procedure: REMOVAL OF OLD PINS RIGHT HIP AND RIGHT TOTAL HIP ARTHROPLASTY ANTERIOR APPROACH;  Surgeon: Mcarthur Rossetti, MD;  Location: [facility];  Service:  Orthopedics;  Laterality: Right;  . TRANSTHORACIC ECHOCARDIOGRAM  10/30/14   Mod LVH, EF 60-65%, normal wall motion, mod mitral regurg, mild PAH     Current Outpatient Prescriptions  Medication Sig Dispense Refill  . acetaminophen (TYLENOL) 325 MG tablet Take 2 tablets (650 mg total) by mouth every 4 (four) hours as needed for headache or mild pain.    [name] albuterol (PROVENTIL HFA;VENTOLIN HFA) 108 (90 Base) MCG/ACT inhaler Inhale 2 puffs into the lungs every 6 (six) hours as needed for wheezing or shortness of breath. 1 Inhaler 6  . amLODipine (NORVASC) 5 MG tablet Take 1 tablet (5 mg total) by mouth daily. 30 tablet 11  . Ascorbic Acid (VITAMIN C) 1000 MG tablet Take 1,000 mg by mouth daily.    [name] aspirin 81 MG tablet Take 81 mg by mouth at bedtime. Reported on  02/26/2015    . atorvastatin (LIPITOR) 40 MG tablet Take 1 tablet (40 mg total) by mouth daily. 30 tablet 3  . budesonide-formoterol (SYMBICORT) 160-4.5 MCG/ACT inhaler Inhale 2 puffs into the lungs 2 (two) times daily. 1 Inhaler 12  . buPROPion (WELLBUTRIN SR) 150 MG 12 hr tablet TAKE ONE TABLET BY MOUTH TWICE DAILY 60 tablet 6  . Cholecalciferol (VITAMIN D-3) 1000 units CAPS Take 1,000 Units by mouth daily. Reported on 02/26/2015    . Chromium Picolinate 1000 MCG TABS Take 1,000 mcg by mouth daily. Reported on 02/26/2015    . citalopram (CELEXA) 40 MG tablet Take 1 tablet (40 mg total) by mouth daily. 90 tablet 3  . diltiazem (CARDIZEM CD) 240 MG 24 hr capsule Take 1 capsule (240 mg total) by mouth daily. 30 capsule 1  . Empagliflozin-Metformin HCl (SYNJARDY) 05-998 MG TABS Take 1 tablet by mouth 2 (two) times daily after a meal. 60 tablet 1  . fluticasone (FLONASE) 50 MCG/ACT nasal spray Place 2 sprays into both nostrils daily. 16 g 1  . fluticasone furoate-vilanterol (BREO ELLIPTA) 200-25 MCG/INH AEPB Inhale 1 puff into the lungs daily. 60 each 6  . Fluticasone-Umeclidin-Vilant (TRELEGY ELLIPTA) 100-62.5-25 MCG/INH AEPB Inhale 1 puff  into the lungs daily. 60 each 2  . furosemide (LASIX) 40 MG tablet Take 40 mg by mouth daily.    [name] gabapentin (NEURONTIN) 100 MG capsule TAKE 2 CAPSULES BY MOUTH 3 TIMES DAILY 180 capsule 6  . guaiFENesin (MUCINEX) 600 MG 12 hr tablet Take 2 tablets (1,200 mg total) by mouth 2 (two) times daily. 30 tablet 0  . Insulin Detemir (LEVEMIR FLEXTOUCH) 100 UNIT/ML Pen Inject 80 Units into the  skin at bedtime.  Change site of injection with every use. (Patient taking differently: 80 Units. Inject 80 Units into the  skin at bedtime.  Change site of injection with every use.) 45 mL 3  . Insulin Pen Needle 32G X 5 MM MISC Use to inject insulin TID as directed by physician. 100 each 12  . insulin regular (NOVOLIN R,HUMULIN R) 100 units/mL injection Inject 16 units SQ with BF, 17 units SQ with Lunch, and 18 U SQ with Supper (Patient taking differently: Inject 24 Units into the skin See admin instructions. Inject 16 units SQ with BF, 17 units SQ with Lunch, and 18 U SQ with Supper) 1 vial 6  . isosorbide mononitrate (IMDUR) 30 MG 24 hr tablet Take 1 tablet by mouth  daily 90 tablet 3  . Multiple Vitamin (MULTIVITAMIN WITH MINERALS) TABS Take 1 tablet by mouth daily.    . nitroGLYCERIN (NITROSTAT) 0.4 MG SL tablet Place 1 tablet (0.4 mg total) under the tongue every 5 (five) minutes as needed for chest pain. 50 tablet 3  . pioglitazone (ACTOS) 45 MG tablet Take 45 mg by mouth daily. Reported on 02/26/2015    . PROAIR HFA 108 (90 Base) MCG/ACT inhaler INHALE 2 PUFFS INTO THE LUNGS EVERY 4 HOURS AS NEEDED FOR WHEEZING ORSHORTNESS OF BREATH 8.5 g 6  . rivaroxaban (XARELTO) 20 MG TABS tablet Take 1 tablet (20 mg total) by mouth daily with supper. 90 tablet 0  . tamsulosin (FLOMAX) 0.4 MG CAPS capsule TAKE 1 CAPSULE BY MOUTH EVERY DAY AFTER SUPPER 90 capsule 1   No current facility-administered medications for this visit.     Allergies:   Morphine and related; Demerol [meperidine]; and Starlix [nateglinide]     Social History:  The patient  reports that he quit smoking  about 3 months ago. His smoking use included Cigarettes. He has a 15.50 pack-year smoking history. He quit smokeless tobacco use about 2 years ago. He reports that he does not drink alcohol or use drugs.   Family History:  The patient's family history includes Breast cancer in his sister; CVA in his mother; Diabetes in his maternal uncle and paternal grandmother; Heart attack in his father; Heart disease in his father and maternal uncle; Hypertension in his mother.    ROS:  Please see the history of present illness.   Otherwise, review of systems are positive for fatigue, dyspnea on exertion.   All other systems are reviewed and negative.    PHYSICAL EXAM: VS:  BP (!) 118/56   Pulse 80   Ht 5' 11.5" (1.816 m)   Wt 254 lb (115.2 kg)   BMI 34.93 kg/m  , BMI Body mass index is 34.93 kg/m. GEN: Well nourished, well developed, in no acute distress  HEENT: normal  Neck: no JVD, carotid bruits, or masses Cardiac: RRR; no murmurs, rubs, or gallops,no edema  Respiratory:  clear to auscultation bilaterally, normal work of breathing GI: soft, nontender, nondistended, + BS MS: no deformity or atrophy  Skin: warm and dry, no rash Neuro:  Strength and sensation are intact Psych: euthymic mood, full affect   EKG:   The ekg ordered today demonstrates normal sinus rhythm, poor R-wave progression, no ST segment changes   Recent Labs: 10/23/2015: B Natriuretic Peptide 47.7; Magnesium 2.3 10/31/2015: Hemoglobin 14.8; Platelets 223 11/21/2015: BUN 15; Creatinine, Ser 0.83; Potassium 4.2; Sodium 136   Lipid Panel    Component Value Date/Time   CHOL 268 (H) 11/21/2015 0825   TRIG 259.0 (H) 11/21/2015 0825   HDL 54.60 11/21/2015 0825   CHOLHDL 5 11/21/2015 0825   VLDL 51.8 (H) 11/21/2015 0825   LDLCALC 63 07/04/2013 0325   LDLDIRECT 187.0 11/21/2015 0825     Other studies Reviewed: Additional studies/ records that were  reviewed today with results demonstrating: LVEF 60-65%.  Episode of atrial fibrillation documented in the hospital in October 2016. He was started on Zaroxolyn at that time.   ASSESSMENT AND PLAN:  1. Coronary artery disease:  No angina. Status post bypass surgery. Continue aspirin for now. If he develops any bleeding problems, would have to stop aspirin in favor of Xarelto. PAF: Xarelto for stroke prevention.  Noted on 11/02/14 ECG.  This patients CHA2DS2-VASc Score and unadjusted Ischemic Stroke Rate (% per year) is equal to 7.2 % stroke rate/year from a score of 5.  He has had several falls.  I counseled him to try to be careful and avoid falls.  Has not hit his head or passed out.   Above score calculated as 1 point each if present [CHF, HTN, DM, Vascular=MI/PAD/Aortic Plaque, Age if 65-74, or Male] Above score calculated as 2 points each if present [Age > 75, or Stroke/TIA/TE]  Hyperlipidemia: Continue lipid-lowering therapy. LDL in November 2017 elevated.  Now on atorvastatin.  He will have lipids checked with PMD in a few months.  LDL should be aound 70 ideally.  COuld consider switch to Crestor if LDL still increased.   Hypertension: Well controlled. Continue current meds.    Current medicines are reviewed at length with the patient today.  The patient concerns regarding his medicines were addressed.  The following changes have been made:  No change  Labs/ tests ordered today include:  No orders of the defined types were placed in this encounter.  Recommend 150 minutes/week of aerobic exercise Low fat, low carb, high fiber diet recommended  Disposition:   FU in one year   Signed, Larae Grooms, MD  01/31/2016 8:22 AM    [facility] [address], [place], [place]  [address] Phone: [phone]; Fax: [phone]

## 2016-01-31 NOTE — Patient Instructions (Signed)
**Note De-identified  Obfuscation** Medication Instructions:  Same-no changes  Labwork: None  Testing/Procedures: None  Follow-Up: Your physician wants you to follow-up in: 1 year. You will receive a reminder letter in the mail two months in advance. If you don't receive a letter, please call our office to schedule the follow-up appointment.      If you need a refill on your cardiac medications before your next appointment, please call your pharmacy.   

## 2016-02-06 DIAGNOSIS — I739 Peripheral vascular disease, unspecified: Secondary | ICD-10-CM

## 2016-02-06 HISTORY — DX: Peripheral vascular disease, unspecified: I73.9

## 2016-02-17 ENCOUNTER — Other Ambulatory Visit: Payer: Self-pay | Admitting: Endocrinology

## 2016-02-17 ENCOUNTER — Other Ambulatory Visit: Payer: Self-pay | Admitting: Family Medicine

## 2016-02-17 NOTE — Telephone Encounter (Signed)
Barrera.  RF request for isosorbide LOV: 11/26/15 Next ov: 02/19/16 Last written: 01/04/15 #90 w/ 3RF  RF request for atorvastatin Last written: 11/22/15 #30 w/ 3RF

## 2016-02-19 ENCOUNTER — Encounter: Payer: Self-pay | Admitting: Family Medicine

## 2016-02-19 ENCOUNTER — Ambulatory Visit (INDEPENDENT_AMBULATORY_CARE_PROVIDER_SITE_OTHER): Payer: Medicare Other | Admitting: Family Medicine

## 2016-02-19 VITALS — BP 125/74 | HR 83 | Temp 98.0°F | Resp 16 | Ht 71.5 in | Wt 252.0 lb

## 2016-02-19 DIAGNOSIS — E7800 Pure hypercholesterolemia, unspecified: Secondary | ICD-10-CM

## 2016-02-19 DIAGNOSIS — I480 Paroxysmal atrial fibrillation: Secondary | ICD-10-CM

## 2016-02-19 DIAGNOSIS — R0989 Other specified symptoms and signs involving the circulatory and respiratory systems: Secondary | ICD-10-CM

## 2016-02-19 DIAGNOSIS — I5032 Chronic diastolic (congestive) heart failure: Secondary | ICD-10-CM | POA: Diagnosis not present

## 2016-02-19 DIAGNOSIS — I10 Essential (primary) hypertension: Secondary | ICD-10-CM

## 2016-02-19 LAB — COMPREHENSIVE METABOLIC PANEL
ALBUMIN: 4.3 g/dL (ref 3.5–5.2)
ALT: 24 U/L (ref 0–53)
AST: 18 U/L (ref 0–37)
Alkaline Phosphatase: 107 U/L (ref 39–117)
BILIRUBIN TOTAL: 0.6 mg/dL (ref 0.2–1.2)
BUN: 23 mg/dL (ref 6–23)
CALCIUM: 9.3 mg/dL (ref 8.4–10.5)
CO2: 29 mEq/L (ref 19–32)
CREATININE: 1 mg/dL (ref 0.40–1.50)
Chloride: 105 mEq/L (ref 96–112)
GFR: 77.04 mL/min (ref >60.00)
Glucose, Bld: 144 mg/dL — ABNORMAL HIGH (ref 70–99)
Potassium: 4.7 mEq/L (ref 3.5–5.1)
Sodium: 141 mEq/L (ref 135–145)
TOTAL PROTEIN: 6.6 g/dL (ref 6.0–8.3)

## 2016-02-19 LAB — LIPID PANEL
CHOLESTEROL: 119 mg/dL (ref 0–200)
HDL: 43.3 mg/dL (ref >39.00)
LDL Cholesterol: 44 mg/dL (ref 0–99)
NonHDL: 76
TRIGLYCERIDES: 159 mg/dL — AB (ref 0.0–149.0)
Total CHOL/HDL Ratio: 3
VLDL: 31.8 mg/dL (ref 0.0–40.0)

## 2016-02-19 NOTE — Progress Notes (Signed)
Pre visit review using our clinic review tool, if applicable. No additional management support is needed unless otherwise documented below in the visit note.

## 2016-02-19 NOTE — Progress Notes (Signed)
OFFICE VISIT  02/19/2016   CC:  Chief Complaint  Patient presents with  . Follow-up    RCI, pt is not fasting.    HPI:    Patient is a 76 y.o. Caucasian male who presents for 3 mo f/u HTN, HLD, chronic diastolic CHF, PAF on xarelto. Last visit I changed his chol med to atrova 24m qd.  Also referred him to endo (Dr. KDwyane Dee for mgmt of his DM 2 at that time.   Next f/u with endo is 03/02/16.  Most recent cardiology f/u 01/31/16 reviewed: no changes made.  Next f/u 1 yr.   HLD: no side effects taking atorva qd.   HTN: home bp consistently <140/90.   LUNGS: At rest and with casual walking he feels no SOB. More long distance walking or up incline/steps causes SOB.  He rests and feels back to baseline after about 30 min.  No cough or CP.  No palpitations or heart racing.   Past Medical History:  Diagnosis Date  . Adenomatous colon polyp 10/16/2011   Repeat 2018  . Arthritis     Hips, R>L & KNEES  . CAD, multiple vessel    3V CAD cath 08/08/13----CABG done shortly after.  . 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 pylori neg, no dysplasia or malignancy.  . Chronic diastolic heart failure ([facility] 2016  . Chronic renal insufficiency, stage II (mild)   . COPD (chronic obstructive pulmonary disease) ([facility]    GOLD II.  Spirometry  2004 borderline obstruction; 2015 mod obst: noncompliant with bid symbicort so pulmonologist switched him to a once daily inhaler: Trelegy ellipta 12/2015.  . Diabetic nephropathy ([facility]    Elevated urine microalb/cr 03/2011  . DM type 2 (diabetes mellitus, type 2) (HCC)    Poor control on max oral meds--pt eventually agreed to insulin therap  . DOE (dyspnea on exertion)    COPD + chronic diastolic HF  . Erectile dysfunction    Normal testosterone  . Hearing loss of both ears 2016   Hearing aids  . Hyperlipidemia   . Hyperplastic colon polyp 2001  . Hypertension   . Iron deficiency anemia 2014   03/2012 capsule endoscopy  showed 2 AVMs--likely responsible for his IDA--lifetime iron supp recommended + q653moBCs.  . Macular degeneration, dry    Mild, bilat (Optometrist, [place] [employer] [place] . Obesity   . Open toe wound 11/2015   Wound care clinic appt made and then canceled when toe improved.  . Other and unspecified angina pectoris   . PAF (paroxysmal atrial fibrillation) ([place]   xarelto  . Pericardial effusion with cardiac tamponade 6//29/15   pericardiocentesis was done,  Infectious/inflamm (cytology showed NO MALIGNANT CELLS)  . Pneumonia   . Tobacco dependence in remission    100+ pack-yr hx: quit after CABG    Past Surgical History:  Procedure Laterality Date  . APPENDECTOMY  1957  . CARDIAC CATHETERIZATION  08/08/2013  . CATARACT EXTRACTION W/ INTRAOCULAR LENS  IMPLANT, BILATERAL  04/08/2006 & 04/22/2006  . CATARACT EXTRACTION W/ INTRAOCULAR LENS  IMPLANT, BILATERAL Bilateral   . CHOLECYSTECTOMY OPEN  1999  . COLONOSCOPY  10/16/2011   Procedure: COLONOSCOPY;  Surgeon: RoInda CastleMD;  Location: [facility];  Service: Endoscopy;  Laterality: N/A;  . CORONARY ARTERY BYPASS GRAFT N/A 08/14/2013   Procedure: CORONARY ARTERY BYPASS GRAFTING (CABG);  Surgeon: StMelrose NakayamaMD;  [place] [facility]  Service: Open Heart Surgery;  Laterality: N/A;  Times 4   using left internal mammary artery and endoscopically harvested bilateral saphenous vein  . ESOPHAGOGASTRODUODENOSCOPY  02/25/12   Atrophic gastritis with a few erosions--capsule endoscopy planned as of 02/25/12 (Dr. Deatra Ina).  [name] HARDWARE REMOVAL Right 08/12/2012   Procedure: HARDWARE REMOVAL;  Surgeon: Mcarthur Rossetti, MD;  Location: [facility];  Service: Orthopedics;  Laterality: Right;  . HIP SURGERY Right 1954   Repair of slipped capital femoral epiphysis.  . INTRAOPERATIVE TRANSESOPHAGEAL ECHOCARDIOGRAM N/A 08/14/2013   Normal LV function. Procedure: INTRAOPERATIVE TRANSESOPHAGEAL ECHOCARDIOGRAM;   Surgeon: Melrose Nakayama, MD;  Location: [place];  Service: Open Heart Surgery;  Laterality: N/A;  . LEFT HEART CATHETERIZATION WITH CORONARY ANGIOGRAM N/A 08/08/2013   Procedure: LEFT HEART CATHETERIZATION WITH CORONARY ANGIOGRAM;  Surgeon: Jettie Booze, MD;  Location: [facility] CATH LAB;  Service: Cardiovascular;  Laterality: N/A;  . PATELLA FRACTURE SURGERY Left ~ 1979   bolt + 3 screws to repair tib plateau fx  . PERICARDIAL TAP N/A 07/01/2013   Procedure: PERICARDIAL TAP;  Surgeon: Jettie Booze, MD;  Location: [facility] CATH LAB;  Service: Cardiovascular;  Laterality: N/A;  . TESTICLE SURGERY  as a child   Undescended testicle brought down into scrotum  . TONSILLECTOMY  1947  . TOTAL HIP ARTHROPLASTY Right 08/12/2012   Procedure: REMOVAL OF OLD PINS RIGHT HIP AND RIGHT TOTAL HIP ARTHROPLASTY ANTERIOR APPROACH;  Surgeon: Mcarthur Rossetti, MD;  Location: [facility];  Service: Orthopedics;  Laterality: Right;  . TRANSTHORACIC ECHOCARDIOGRAM  10/30/14   Mod LVH, EF 60-65%, normal wall motion, mod mitral regurg, mild PAH    Outpatient Medications Prior to Visit  Medication Sig Dispense Refill  . acetaminophen (TYLENOL) 325 MG tablet Take 2 tablets (650 mg total) by mouth every 4 (four) hours as needed for headache or mild pain.    [name] albuterol (PROVENTIL HFA;VENTOLIN HFA) 108 (90 Base) MCG/ACT inhaler Inhale 2 puffs into the lungs every 6 (six) hours as needed for wheezing or shortness of breath. 1 Inhaler 6  . amLODipine (NORVASC) 5 MG tablet Take 1 tablet (5 mg total) by mouth daily. 30 tablet 11  . Ascorbic Acid (VITAMIN C) 1000 MG tablet Take 1,000 mg by mouth daily.    [name] aspirin 81 MG tablet Take 81 mg by mouth at bedtime. Reported on 02/26/2015    . atorvastatin (LIPITOR) 40 MG tablet TAKE ONE TABLET BY MOUTH EVERY DAY 30 tablet 6  . budesonide-formoterol (SYMBICORT) 160-4.5 MCG/ACT inhaler Inhale 2 puffs into the lungs 2 (two) times daily. 1 Inhaler 12  . buPROPion (WELLBUTRIN SR) 150  MG 12 hr tablet TAKE ONE TABLET BY MOUTH TWICE DAILY 60 tablet 6  . Cholecalciferol (VITAMIN D-3) 1000 units CAPS Take 1,000 Units by mouth daily. Reported on 02/26/2015    . Chromium Picolinate 1000 MCG TABS Take 1,000 mcg by mouth daily. Reported on 02/26/2015    . citalopram (CELEXA) 40 MG tablet Take 1 tablet (40 mg total) by mouth daily. 90 tablet 3  . diltiazem (CARDIZEM CD) 240 MG 24 hr capsule Take 1 capsule (240 mg total) by mouth daily. 30 capsule 1  . fluticasone (FLONASE) 50 MCG/ACT nasal spray Place 2 sprays into both nostrils daily. 16 g 1  . fluticasone furoate-vilanterol (BREO ELLIPTA) 200-25 MCG/INH AEPB Inhale 1 puff into the lungs daily. 60 each 6  . furosemide (LASIX) 40 MG tablet Take 40 mg by mouth daily.    [name] gabapentin (  NEURONTIN) 100 MG capsule TAKE 2 CAPSULES BY MOUTH 3 TIMES DAILY (Patient taking differently: take 2 capsules in the morning and 3 capsules at bedtime) 180 capsule 6  . guaiFENesin (MUCINEX) 600 MG 12 hr tablet Take 2 tablets (1,200 mg total) by mouth 2 (two) times daily. 30 tablet 0  . Insulin Detemir (LEVEMIR FLEXTOUCH) 100 UNIT/ML Pen Inject 80 Units into the  skin at bedtime.  Change site of injection with every use. (Patient taking differently: 45 units in the morning and 35 at bedtime) 45 mL 3  . Insulin Pen Needle 32G X 5 MM MISC Use to inject insulin TID as directed by physician. 100 each 12  . insulin regular (NOVOLIN R,HUMULIN R) 100 units/mL injection Inject 16 units SQ with BF, 17 units SQ with Lunch, and 18 U SQ with Supper (Patient taking differently: 24 units in the am and 30 at bedtime) 1 vial 6  . isosorbide mononitrate (IMDUR) 30 MG 24 hr tablet TAKE ONE TABLET BY MOUTH DAILY 90 tablet 1  . Multiple Vitamin (MULTIVITAMIN WITH MINERALS) TABS Take 1 tablet by mouth daily.    . nitroGLYCERIN (NITROSTAT) 0.4 MG SL tablet Place 1 tablet (0.4 mg total) under the tongue every 5 (five) minutes as needed for chest pain. 25 tablet 3  . pioglitazone  (ACTOS) 45 MG tablet Take 45 mg by mouth daily. Reported on 02/26/2015    . rivaroxaban (XARELTO) 20 MG TABS tablet Take 1 tablet (20 mg total) by mouth daily with supper. 90 tablet 0  . SYNJARDY 05-998 MG TABS TAKE ONE TABLET BY MOUTH TWICE DAILY AFTER A MEAL 60 tablet 3  . tamsulosin (FLOMAX) 0.4 MG CAPS capsule TAKE 1 CAPSULE BY MOUTH EVERY DAY AFTER SUPPER 90 capsule 1  . Fluticasone-Umeclidin-Vilant (TRELEGY ELLIPTA) 100-62.5-25 MCG/INH AEPB Inhale 1 puff into the lungs daily. (Patient not taking: Reported on 02/19/2016) 60 each 2  . PROAIR HFA 108 (90 Base) MCG/ACT inhaler INHALE 2 PUFFS INTO THE LUNGS EVERY 4 HOURS AS NEEDED FOR WHEEZING ORSHORTNESS OF BREATH (Patient not taking: Reported on 02/19/2016) 8.5 g 6   No facility-administered medications prior to visit.     Allergies  Allergen Reactions  . Morphine And Related Other (See Comments)    Drenched with perspiration  . Demerol [Meperidine] Nausea Only  . Starlix [Nateglinide] Other (See Comments)    gassy    ROS As per HPI  PE: Blood pressure 125/74, pulse 83, temperature 98 F (36.7 C), temperature source Oral, resp. rate 16, height 5' 11.5" (1.816 m), weight 252 lb (114.3 kg), SpO2 94 %. Gen: Alert, well appearing.  Patient is oriented to person, place, time, and situation. AFFECT: pleasant, lucid thought and speech. CV: RRR LUNGS: CTA bilat, nonlabored resps. EXT: no clubbing or cyanosis.  He has 2+ pitting edema L lower leg, 1+R LL. DP and PT pulses barely palpable bilat. Feet: no ulcers, calluses, or focal lesions.  LABS:  Lab Results  Component Value Date   TSH 0.918 07/01/2013   Lab Results  Component Value Date   WBC 15.2 (H) 10/31/2015   HGB 14.8 10/31/2015   HCT 46.5 10/31/2015   MCV 79.6 (L) 10/31/2015   PLT 223 10/31/2015   Lab Results  Component Value Date   CREATININE 0.83 11/21/2015   BUN 15 11/21/2015   NA 136 11/21/2015   K 4.2 11/21/2015   CL 99 11/21/2015   CO2 26 11/21/2015   Lab  Results  Component Value Date  ALT 21 11/30/2014   AST 20 11/30/2014   ALKPHOS 60 11/30/2014   BILITOT 0.5 11/30/2014   Lab Results  Component Value Date   CHOL 268 (H) 11/21/2015   Lab Results  Component Value Date   HDL 54.60 11/21/2015   Lab Results  Component Value Date   LDLCALC 63 07/04/2013   Lab Results  Component Value Date   TRIG 259.0 (H) 11/21/2015   Lab Results  Component Value Date   CHOLHDL 5 11/21/2015   Lab Results  Component Value Date   PSA 1.33 09/01/2011   Lab Results  Component Value Date   HGBA1C 10.4 (H) 11/21/2015   IMPRESSION AND PLAN:  1) HLD: repeat fasting lipide panel today to see what lipids are since switch to atorvastatin several months ago. Check AST/ALT as well.  2) HTN: The current medical regimen is effective;  continue present plan and medications. Check lytes/cr today.  3) Chronic diastolic CHF: wt stable, no sign of volume overload today.  Stable DOE. Continue current lasix 66m qd and he'll plan routine cardiology f/u for 01/2017.  4) PAF: rate controlled on diltiazem.  Pt asymptomatic.  No sign of bleeding on xarelto.  5) PAD: will start with checking ABI's--ordered today.  6) DM 2: pt satisfied with improvements since he started going to Dr. KDwyane Deein endocrinology.  Continue with this.  7) Hx of iron def anemia: 2014 capsule endoscopy showed 2 AVMs.  Pt needs lifelong iron supplementation and CBC checks q 6 mo.  Will check CBC at next f/u in 3 mo.  An After Visit Summary was printed and given to the patient.  FOLLOW UP: Return in about 3 months (around 05/18/2016) for annual CPE (fasting)--also, medicare AWV with [place]ie [place] day if possible.  Signed:  PCrissie Sickles MD           02/19/2016

## 2016-02-21 ENCOUNTER — Other Ambulatory Visit: Payer: Self-pay | Admitting: Family Medicine

## 2016-02-21 DIAGNOSIS — I739 Peripheral vascular disease, unspecified: Secondary | ICD-10-CM

## 2016-02-21 DIAGNOSIS — R0989 Other specified symptoms and signs involving the circulatory and respiratory systems: Secondary | ICD-10-CM

## 2016-02-26 ENCOUNTER — Ambulatory Visit (HOSPITAL_COMMUNITY)
Admission: RE | Admit: 2016-02-26 | Discharge: 2016-02-26 | Disposition: A | Discharge status: Home / Self Care | Payer: Medicare Other | Source: Ambulatory Visit | Attending: Cardiology | Admitting: Cardiology

## 2016-02-26 DIAGNOSIS — I10 Essential (primary) hypertension: Secondary | ICD-10-CM | POA: Insufficient documentation

## 2016-02-26 DIAGNOSIS — E1151 Type 2 diabetes mellitus with diabetic peripheral angiopathy without gangrene: Secondary | ICD-10-CM | POA: Diagnosis not present

## 2016-02-26 DIAGNOSIS — Z951 Presence of aortocoronary bypass graft: Secondary | ICD-10-CM | POA: Diagnosis not present

## 2016-02-26 DIAGNOSIS — Z8673 Personal history of transient ischemic attack (TIA), and cerebral infarction without residual deficits: Secondary | ICD-10-CM | POA: Diagnosis not present

## 2016-02-26 DIAGNOSIS — Z87891 Personal history of nicotine dependence: Secondary | ICD-10-CM | POA: Insufficient documentation

## 2016-02-26 DIAGNOSIS — J449 Chronic obstructive pulmonary disease, unspecified: Secondary | ICD-10-CM | POA: Insufficient documentation

## 2016-02-26 DIAGNOSIS — E785 Hyperlipidemia, unspecified: Secondary | ICD-10-CM | POA: Diagnosis not present

## 2016-02-26 DIAGNOSIS — R0989 Other specified symptoms and signs involving the circulatory and respiratory systems: Secondary | ICD-10-CM | POA: Diagnosis not present

## 2016-02-26 DIAGNOSIS — I739 Peripheral vascular disease, unspecified: Secondary | ICD-10-CM

## 2016-03-01 ENCOUNTER — Encounter: Payer: Self-pay | Admitting: Family Medicine

## 2016-03-01 ENCOUNTER — Other Ambulatory Visit: Payer: Self-pay | Admitting: Family Medicine

## 2016-03-01 DIAGNOSIS — R0989 Other specified symptoms and signs involving the circulatory and respiratory systems: Secondary | ICD-10-CM

## 2016-03-01 DIAGNOSIS — R6889 Other general symptoms and signs: Secondary | ICD-10-CM

## 2016-03-02 ENCOUNTER — Ambulatory Visit: Payer: Medicare Other | Admitting: Endocrinology

## 2016-03-02 NOTE — Progress Notes (Signed)
Patient ID: EVEN BUDLONG, male   DOB: [birth year], 76 y.o.   MRN: [id number]           Cancelled appointrment  Review of Systems

## 2016-03-04 ENCOUNTER — Ambulatory Visit (INDEPENDENT_AMBULATORY_CARE_PROVIDER_SITE_OTHER): Payer: Medicare Other | Admitting: Pulmonary Disease

## 2016-03-04 ENCOUNTER — Encounter: Payer: Self-pay | Admitting: Pulmonary Disease

## 2016-03-04 VITALS — BP 112/60 | HR 88 | Ht 71.0 in | Wt 249.0 lb

## 2016-03-04 DIAGNOSIS — Z8709 Personal history of other diseases of the respiratory system: Secondary | ICD-10-CM

## 2016-03-04 MED ORDER — BUDESONIDE-FORMOTEROL FUMARATE 160-4.5 MCG/ACT IN AERO: 2.0000 | INHALATION_SPRAY | Freq: Two times a day (BID) | RESPIRATORY_TRACT | 0 refills | Status: DC

## 2016-03-04 NOTE — Progress Notes (Signed)
Joshua [name]    [id number]    September 11, 1940  Primary Care Physician:Joshua H, MD  Referring Physician: Tammi Sou, MD [address] [place], [place] [address]  Chief complaint:   Follow up for COPD Asbestos exposure  HPI:  Joshua [name] is a 76 year old with history of COPD GOLD D. He was hospitalized in October 2016 with acute on chronic respiratory failure secondary to diastolic heart failure, COPD exacerbation. Since his discharge he has been doing well. He had an ER visit in March 2017 for a fall and forearm laceration.   He is a heavy smoker he smoked about 3 packs per day for 40 years. He quit last month after this hospitalization. He worked as a Advertising account planner. He reports exposure to asbestos for about a year to 2 years between 1958 and 1959. He's had spirometry in 2004 which showed borderline obstruction. Repeat pulmonary function tests in 2015 shows moderate obstruction. He's been maintained on Symbicort for many years. He does not take it on a regular basis and skips some days.   Interim history: He was started on Trelegy and then Breo at last visit but he cannot afford the co-pay as is going into the donut hole. He prefers to go back on the Symbicort. He complains of chronic dyspnea on exertion, chronic cough with mucus production. Denies any wheezing, chest pain, palpitation, fevers, chills.  Outpatient Encounter Prescriptions as of 03/04/2016  Medication Sig  . acetaminophen (TYLENOL) 325 MG tablet Take 2 tablets (650 mg total) by mouth every 4 (four) hours as needed for headache or mild pain.  [name] albuterol (PROVENTIL HFA;VENTOLIN HFA) 108 (90 Base) MCG/ACT inhaler Inhale 2 puffs into the lungs every 6 (six) hours as needed for wheezing or shortness of breath.  [name] amLODipine (NORVASC) 5 MG tablet Take 1 tablet (5 mg total) by mouth daily.  . Ascorbic Acid (VITAMIN C) 1000 MG tablet Take 1,000 mg by mouth daily.  [name] aspirin 81 MG tablet Take  81 mg by mouth at bedtime. Reported on 02/26/2015  . atorvastatin (LIPITOR) 40 MG tablet TAKE ONE TABLET BY MOUTH EVERY DAY  . budesonide-formoterol (SYMBICORT) 160-4.5 MCG/ACT inhaler Inhale 2 puffs into the lungs 2 (two) times daily.  [name] buPROPion (WELLBUTRIN SR) 150 MG 12 hr tablet TAKE ONE TABLET BY MOUTH TWICE DAILY  . Cholecalciferol (VITAMIN D-3) 1000 units CAPS Take 1,000 Units by mouth daily. Reported on 02/26/2015  . Chromium Picolinate 1000 MCG TABS Take 1,000 mcg by mouth daily. Reported on 02/26/2015  . citalopram (CELEXA) 40 MG tablet Take 1 tablet (40 mg total) by mouth daily.  [name] diltiazem (CARDIZEM CD) 240 MG 24 hr capsule Take 1 capsule (240 mg total) by mouth daily.  . fluticasone (FLONASE) 50 MCG/ACT nasal spray Place 2 sprays into both nostrils daily.  . fluticasone furoate-vilanterol (BREO ELLIPTA) 200-25 MCG/INH AEPB Inhale 1 puff into the lungs daily.  . furosemide (LASIX) 40 MG tablet Take 40 mg by mouth daily.  [name] gabapentin (NEURONTIN) 100 MG capsule TAKE 2 CAPSULES BY MOUTH 3 TIMES DAILY (Patient taking differently: take 2 capsules in the morning and 3 capsules at bedtime)  . guaiFENesin (MUCINEX) 600 MG 12 hr tablet Take 2 tablets (1,200 mg total) by mouth 2 (two) times daily.  . Insulin Detemir (LEVEMIR FLEXTOUCH) 100 UNIT/ML Pen Inject 80 Units into the  skin at bedtime.  Change site of injection with every use. (Patient  taking differently: 45 units in the morning and 35 at bedtime)  . Insulin Pen Needle 32G X 5 MM MISC Use to inject insulin TID as directed by physician.  . insulin regular (NOVOLIN R,HUMULIN R) 100 units/mL injection Inject 16 units SQ with BF, 17 units SQ with Lunch, and 18 U SQ with Supper (Patient taking differently: 24 units in the am and 30 at bedtime)  . isosorbide mononitrate (IMDUR) 30 MG 24 hr tablet TAKE ONE TABLET BY MOUTH DAILY  . Multiple Vitamin (MULTIVITAMIN WITH MINERALS) TABS Take 1 tablet by mouth daily.  . nitroGLYCERIN (NITROSTAT) 0.4 MG  SL tablet Place 1 tablet (0.4 mg total) under the tongue every 5 (five) minutes as needed for chest pain.  . pioglitazone (ACTOS) 45 MG tablet Take 45 mg by mouth daily. Reported on 02/26/2015  . rivaroxaban (XARELTO) 20 MG TABS tablet Take 1 tablet (20 mg total) by mouth daily with supper.  [name] SYNJARDY 05-998 MG TABS TAKE ONE TABLET BY MOUTH TWICE DAILY AFTER A MEAL  . tamsulosin (FLOMAX) 0.4 MG CAPS capsule TAKE 1 CAPSULE BY MOUTH EVERY DAY AFTER SUPPER  . [DISCONTINUED] Fluticasone-Umeclidin-Vilant (TRELEGY ELLIPTA) 100-62.5-25 MCG/INH AEPB Inhale 1 puff into the lungs daily.   No facility-administered encounter medications on file as of 03/04/2016.     Allergies as of 03/04/2016 - Review Complete 03/04/2016  Allergen Reaction Noted  . Morphine and related Other (See Comments) 09/01/2011  . Demerol [meperidine] Nausea Only 09/01/2011  . Starlix [nateglinide] Other (See Comments) 09/01/2011    Past Medical History:  Diagnosis Date  . Adenomatous colon polyp 10/16/2011   Repeat 2018  . Arthritis     Hips, R>L & KNEES  . CAD, multiple vessel    3V CAD cath 08/08/13----CABG done shortly after.  . Chronic atrophic gastritis 02/25/12   gastric bx: +intestinal metaplasia, [name]. pylori neg, no dysplasia or malignancy.  . Chronic diastolic heart failure ([place]) 2016  . Chronic renal insufficiency, stage II (mild)   . COPD (chronic obstructive pulmonary disease) ([place])    GOLD II.  Spirometry  2004 borderline obstruction; 2015 mod obst: noncompliant with bid symbicort so pulmonologist switched him to a once daily inhaler: Trelegy ellipta 12/2015.  . Diabetic nephropathy ([facility])    Elevated urine microalb/cr 03/2011  . DM type 2 (diabetes mellitus, type 2) ([facility])    Poor control on max oral meds--pt eventually agreed to insulin therap  . DOE (dyspnea on exertion)    COPD + chronic diastolic HF  . Erectile dysfunction    Normal testosterone  . Hearing loss of both ears 2016   Hearing aids  .  Hyperlipidemia   . Hyperplastic colon polyp 2001  . Hypertension   . Iron deficiency anemia 2014   03/2012 capsule endoscopy showed 2 AVMs--likely responsible for his IDA--lifetime iron supp recommended + q6moCBCs.  . Macular degeneration, dry    Mild, bilat (Optometrist, DMayford Knifeat [employer] [place]  . Obesity   . Open toe wound 11/2015   Wound care clinic appt made and then canceled when toe improved.  . Other and unspecified angina pectoris   . PAD (peripheral artery disease) ([place] 02/2016   Abnormal ABI's and waveforms: LE arterial duplex ordered for f/u as per cardiologist's recommendation.  .Marland KitchenPAF (paroxysmal atrial fibrillation) ([place] 10/2014   xarelto  . Pericardial effusion with cardiac tamponade 6//29/15   pericardiocentesis was done,  Infectious/inflamm (cytology showed NO MALIGNANT CELLS)  . Pneumonia   .  Tobacco dependence in remission    100+ pack-yr hx: quit after CABG    Past Surgical History:  Procedure Laterality Date  . APPENDECTOMY  1957  . CARDIAC CATHETERIZATION  08/08/2013  . CATARACT EXTRACTION W/ INTRAOCULAR LENS  IMPLANT, BILATERAL  04/08/2006 & 04/22/2006  . CATARACT EXTRACTION W/ INTRAOCULAR LENS  IMPLANT, BILATERAL Bilateral   . CHOLECYSTECTOMY OPEN  1999  . COLONOSCOPY  10/16/2011   Procedure: COLONOSCOPY;  Surgeon: Inda Castle, MD;  Location: [facility];  Service: Endoscopy;  Laterality: N/A;  . CORONARY ARTERY BYPASS GRAFT N/A 08/14/2013   Procedure: CORONARY ARTERY BYPASS GRAFTING (CABG);  Surgeon: Melrose Nakayama, MD;  Location: [facility];  Service: Open Heart Surgery;  Laterality: N/A;  Times 4   using left internal mammary artery and endoscopically harvested bilateral saphenous vein  . ESOPHAGOGASTRODUODENOSCOPY  02/25/12   Atrophic gastritis with a few erosions--capsule endoscopy planned as of 02/25/12 (Dr. Deatra Ina).  [name] HARDWARE REMOVAL Right 08/12/2012   Procedure: HARDWARE REMOVAL;  Surgeon: Mcarthur Rossetti, MD;   Location: [facility];  Service: Orthopedics;  Laterality: Right;  . HIP SURGERY Right 1954   Repair of slipped capital femoral epiphysis.  . INTRAOPERATIVE TRANSESOPHAGEAL ECHOCARDIOGRAM N/A 08/14/2013   Normal LV function. Procedure: INTRAOPERATIVE TRANSESOPHAGEAL ECHOCARDIOGRAM;  Surgeon: Melrose Nakayama, MD;  Location: [facility];  Service: Open Heart Surgery;  Laterality: N/A;  . LEFT HEART CATHETERIZATION WITH CORONARY ANGIOGRAM N/A 08/08/2013   Procedure: LEFT HEART CATHETERIZATION WITH CORONARY ANGIOGRAM;  Surgeon: Jettie Booze, MD;  Location: [facility] CATH LAB;  Service: Cardiovascular;  Laterality: N/A;  . PATELLA FRACTURE SURGERY Left ~ 1979   bolt + 3 screws to repair tib plateau fx  . PERICARDIAL TAP N/A 07/01/2013   Procedure: PERICARDIAL TAP;  Surgeon: Jettie Booze, MD;  Location: [facility] CATH LAB;  Service: Cardiovascular;  Laterality: N/A;  . TESTICLE SURGERY  as a child   Undescended testicle brought down into scrotum  . TONSILLECTOMY  1947  . TOTAL HIP ARTHROPLASTY Right 08/12/2012   Procedure: REMOVAL OF OLD PINS RIGHT HIP AND RIGHT TOTAL HIP ARTHROPLASTY ANTERIOR APPROACH;  Surgeon: Mcarthur Rossetti, MD;  Location: [facility];  Service: Orthopedics;  Laterality: Right;  . TRANSTHORACIC ECHOCARDIOGRAM  10/30/14   Mod LVH, EF 60-65%, normal wall motion, mod mitral regurg, mild PAH    Family History  Problem Relation Age of Onset  . Heart disease Father   . Heart attack Father   . CVA Mother   . Hypertension Mother   . Diabetes Paternal Grandmother   . Breast cancer Sister   . Diabetes Maternal Uncle   . Heart disease Maternal Uncle   . Stroke Neg Hx     Social History   Social History  . Marital status: Married    Spouse name: [name]  . Number of children: 4  . Years of education: N/A   Occupational History  . bus operator    Social History Main Topics  . Smoking status: Former Smoker    Packs/day: 0.25    Years: 62.00    Types: Cigarettes    Quit date:  10/23/2015  . Smokeless tobacco: Former Systems developer    Quit date: 06/30/2013  . Alcohol use No  . Drug use: No  . Sexual activity: No   Other Topics Concern  . Not on file   Social History Narrative   Married, 4 children.   Formerly a Medical illustrator.   Local transportation bus driver.  Level of education: HS.     +tobacco--lifelong (still smoking as of 08/2011).  No alcohol or drugs.   No exercise.  +excessive caffeine.     Review of systems: Review of Systems  Constitutional: Negative for fever and chills.  HENT: Negative.   Eyes: Negative for blurred vision.  Respiratory: as per HPI  Cardiovascular: Negative for chest pain and palpitations.  Gastrointestinal: Negative for vomiting, diarrhea, blood per rectum. Genitourinary: Negative for dysuria, urgency, frequency and hematuria.  Musculoskeletal: Negative for myalgias, back pain and joint pain.  Skin: Negative for itching and rash.  Neurological: Negative for dizziness, tremors, focal weakness, seizures and loss of consciousness.  Endo/Heme/Allergies: Negative for environmental allergies.  Psychiatric/Behavioral: Negative for depression, suicidal ideas and hallucinations.  All other systems reviewed and are negative.   Physical Exam: Blood pressure 112/60, pulse 88, height _0  (1.803 m), weight 112.9 kg (249 lb), SpO2 92 %. Gen:      No acute distress HEENT:  EOMI, sclera anicteric Neck:     No masses; no thyromegaly Lungs:    Clear to auscultation bilaterally; normal respiratory effort CV:         Regular rate and rhythm; no murmurs Abd:      + bowel sounds; soft, non-tender; no palpable masses, no distension Ext:    No edema; adequate peripheral perfusion Skin:      Warm and dry; no rash Neuro: alert and oriented x 3 Psych: normal mood and affect  Data Reviewed: Imaging CT chest [07/02/13] There are small, layering bilateral pleural effusions. Bilateral calcified pleural plaques with a dependant and  basilar predominance. No concerning pleural nodularity. There is dependent atelectasis. Subpleural ground-glass density at the right apex measuring up to 2.4 cm. No evidence of suspicious, solid, nodule.  Chest x-ray 10/23/15-bilateral pleural plaques with calcification, cardiomegaly. I have reviewed all images personally.  PFTs [08/09/13] FVC 3.24 [74%] FEV1 2.12 [65%] F/F 65 TLC 79 DLCO 71. Moderate obstruction, no significant bronchodilator response. Mild restrictive lung disease Decreased diffusion capacity that corrects when adjusted for alveolar volume.   Echo [10/30/14] - Left ventricle: The cavity size was normal. Wall thickness was increased in a pattern of moderate LVH. Systolic function was normal. The estimated ejection fraction was in the range of 60% to 65%. Wall motion was normal; there were no regional wall motion abnormalities. - Mitral valve: Moderately calcified annulus. There was moderate regurgitation. - Right atrium: The atrium was mildly dilated. - Pulmonary arteries: Systolic pressure was mildly increased. PA peak pressure: 38 mm Hg (S).  Assessment:  #1 COPD,GOLD D Stable on inhalers. He wants to go back to symbicort due to insurance issues. I will stop the breo.[name] He is also on albuterol rescue inhaler.    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 He feels that this exposure not considerable however he has calcified pleural plaques on CT scan. There is no evidence of pulmonary fibrosis resulting from asbestos. Order high res CT for follow up  #3 Suspected OSA Sleep study ordered but he cancelled. Feels that he does not have any sleep issues  Plan/Recommendations: - Stop Breo. Resume symbicort - High res CT of chest.  Marshell Garfinkel MD [facility] Pager [phone] 03/04/2016, 9:57 AM  CC: McGowen, Adrian Blackwater, MD

## 2016-03-04 NOTE — Addendum Note (Signed)
Addended by: Valerie Salts on: 03/04/2016 10:19 AM   Modules accepted: Orders

## 2016-03-04 NOTE — Patient Instructions (Signed)
We will schedule you for a high-resolution CT of the chest to follow-up on the asbestos exposure and lung nodule Stop taking the breo. Continue using the Symbicort.  Return to clinic in 6 months.

## 2016-03-06 ENCOUNTER — Telehealth: Payer: Self-pay | Admitting: Family Medicine

## 2016-03-06 NOTE — Telephone Encounter (Signed)
[name]

## 2016-03-06 NOTE — Telephone Encounter (Signed)
Please advise pt. Thanks.

## 2016-03-06 NOTE — Telephone Encounter (Signed)
Patient is calling to request an ultrasound on his arteries.  Please contact him to discuss his options.  Thank you,  -LL

## 2016-03-06 NOTE — Telephone Encounter (Signed)
Patient called to see if his referral to get his arteries/veins in legs checked was placed and when should he expect a phone call. Patient was not sure of where the referral was to be placed. Please call patient on home phone to advise today as he is expecting to hear something by the end of the day or before.

## 2016-03-06 NOTE — Telephone Encounter (Signed)
Message from [place], please advise. Thanks.   Diane: I have scheduled Joshua [name] at the [facility] office for their first avail 3/19. Is this soon enough? If not I can call the hospital & see if I can get him in earlier

## 2016-03-06 NOTE — Telephone Encounter (Signed)
See previous message.

## 2016-03-06 NOTE — Telephone Encounter (Signed)
Yes, this is soon enough.

## 2016-03-06 NOTE — Telephone Encounter (Signed)
Patient has been notified

## 2016-03-08 ENCOUNTER — Encounter: Payer: Self-pay | Admitting: Family Medicine

## 2016-03-10 ENCOUNTER — Ambulatory Visit (INDEPENDENT_AMBULATORY_CARE_PROVIDER_SITE_OTHER): Payer: Medicare Other | Admitting: Endocrinology

## 2016-03-10 ENCOUNTER — Encounter: Payer: Self-pay | Admitting: Endocrinology

## 2016-03-10 VITALS — BP 126/60 | HR 88 | Ht 71.0 in | Wt 256.0 lb

## 2016-03-10 DIAGNOSIS — Z794 Long term (current) use of insulin: Secondary | ICD-10-CM

## 2016-03-10 DIAGNOSIS — E782 Mixed hyperlipidemia: Secondary | ICD-10-CM | POA: Diagnosis not present

## 2016-03-10 DIAGNOSIS — I10 Essential (primary) hypertension: Secondary | ICD-10-CM | POA: Diagnosis not present

## 2016-03-10 DIAGNOSIS — E1165 Type 2 diabetes mellitus with hyperglycemia: Secondary | ICD-10-CM

## 2016-03-10 NOTE — Progress Notes (Signed)
Patient ID: Joshua [name], male   DOB: November 24, 1940, 76 y.o.   MRN: [id number]            Reason for Appointment:  Follow-up for Type 2 Diabetes  Referring physician: [place]   History of Present Illness:          Date of diagnosis of type 2 diabetes mellitus: ?  2002        Background history:   He apparently had been treated with metformin initially which was continued until about 2016. He previously has also been tried on Amaryl, Actos, Farxiga and Victoza but he claims that these did not help his blood sugar and were stopped Appears to have been on insulin since about 2014 but he does not remember details. He has been taking mostly Levemir insulin as a basal insulin but was also on Lantus in 2015 for some time His best A1c has been 7.3, once in 2014 and another time in 2016, otherwise his A1c has been higher and as high as 10.8  Recent history:   INSULIN regimen is:   Levemir 45--35 , Novolin R 24 tid units  or pc meals      Non-insulin hypoglycemic drugs the patient is taking are: Actos 45 mg daily, Synjardy 05/998 twice a day   Current management, blood sugar patterns and problems identified:  Blood sugars from review of his monitor still show overall high readings in the evenings  He now says that his blood sugar testing is about 30 minutes or so after eating despite instructions on checking about 2 hours later  He does not check after breakfast or lunch  FASTING blood sugars are excellent and may be low normal at times including sometimes around 3-4 AM  Again most of his readings right after supper are significantly high  Despite previous instructions and reminders he is still taking his NOVOLIN R about 30 minutes after eating in the evening when he checks his blood sugar  Has been instructed by dietitian but still seems to be gaining weight  No hypoglycemia, he will have symptoms when he has low blood sugars          Side effects from medications have been:  None  Compliance with the medical regimen: Fair Hypoglycemia: Never    Glucose monitoring:  done  times a day         Glucometer:  Accu-Chek .      Blood Glucose readings as   Mean values apply above for all meters except median for One Touch  PRE-MEAL Fasting Lunch Dinner Bedtime Overall  Glucose range: 70-141       Mean/median: 105     180    POST-MEAL PC Breakfast PC Lunch PC Dinner  Glucose range: 207   99-338   Mean/median:   250     Self-care: The diet that the patient has been following is: tries to limit Sweet drinks .     Meal times are:  Breakfast is at 7 a.m.  Dinner: 5-6   Typical meal intake: Breakfast is usually eggs,Sausage, oatmeal or grits.  Lunch is a sandwich, any evening he has meat and vegetables, For snacks he will have fruit or crackers                Dietician visit, most recent: 01/09/16 CDE visit: 12/18               Exercise: Unable to do any     Weight history:  Wt Readings from Last 3 Encounters:  03/10/16 256 lb (116.1 kg)  03/04/16 249 lb (112.9 kg)  02/19/16 252 lb (114.3 kg)    Glycemic control:   Lab Results  Component Value Date   HGBA1C 10.4 (H) 11/21/2015   HGBA1C 9.2 (H) 08/21/2015   HGBA1C 8.8 (H) 05/17/2015   Lab Results  Component Value Date   MICROALBUR 4.6 (H) 12/17/2014   LDLCALC 44 02/19/2016   CREATININE 1.00 02/19/2016   Lab Results  Component Value Date   MICRALBCREAT 6.4 12/17/2014    Other active problems: See review of systems   Allergies as of 03/10/2016      Reactions   Morphine And Related Other (See Comments)   Drenched with perspiration   Demerol [meperidine] Nausea Only   Starlix [nateglinide] Other (See Comments)   gassy      Medication List       Accurate as of 03/10/16  8:41 AM. Always use your most recent med list.          acetaminophen 325 MG tablet Commonly known as:  TYLENOL Take 2 tablets (650 mg total) by mouth every 4 (four) hours as needed for headache or mild pain.    albuterol 108 (90 Base) MCG/ACT inhaler Commonly known as:  PROVENTIL HFA;VENTOLIN HFA Inhale 2 puffs into the lungs every 6 (six) hours as needed for wheezing or shortness of breath.   amLODipine 5 MG tablet Commonly known as:  NORVASC Take 1 tablet (5 mg total) by mouth daily.   aspirin 81 MG tablet Take 81 mg by mouth at bedtime. Reported on 02/26/2015   atorvastatin 40 MG tablet Commonly known as:  LIPITOR TAKE ONE TABLET BY MOUTH EVERY DAY   budesonide-formoterol 160-4.5 MCG/ACT inhaler Commonly known as:  SYMBICORT Inhale 2 puffs into the lungs 2 (two) times daily.   budesonide-formoterol 160-4.5 MCG/ACT inhaler Commonly known as:  SYMBICORT Inhale 2 puffs into the lungs 2 (two) times daily.   buPROPion 150 MG 12 hr tablet Commonly known as:  WELLBUTRIN SR TAKE ONE TABLET BY MOUTH TWICE DAILY   Chromium Picolinate 1000 MCG Tabs Take 1,000 mcg by mouth daily. Reported on 02/26/2015   citalopram 40 MG tablet Commonly known as:  CELEXA Take 1 tablet (40 mg total) by mouth daily.   diltiazem 240 MG 24 hr capsule Commonly known as:  CARDIZEM CD Take 1 capsule (240 mg total) by mouth daily.   fluticasone 50 MCG/ACT nasal spray Commonly known as:  FLONASE Place 2 sprays into both nostrils daily.   furosemide 40 MG tablet Commonly known as:  LASIX Take 40 mg by mouth daily.   gabapentin 100 MG capsule Commonly known as:  NEURONTIN TAKE 2 CAPSULES BY MOUTH 3 TIMES DAILY   guaiFENesin 600 MG 12 hr tablet Commonly known as:  MUCINEX Take 2 tablets (1,200 mg total) by mouth 2 (two) times daily.   Insulin Detemir 100 UNIT/ML Pen Commonly known as:  LEVEMIR FLEXTOUCH Inject 80 Units into the  skin at bedtime.  Change site of injection with every use.   Insulin Pen Needle 32G X 5 MM Misc Use to inject insulin TID as directed by physician.   insulin regular 250 units/2.13m (100 units/mL) injection Commonly known as:  NOVOLIN R,HUMULIN R Inject 16 units SQ with BF,  17 units SQ with Lunch, and 18 U SQ with Supper   isosorbide mononitrate 30 MG 24 hr tablet Commonly known as:  IMDUR TAKE ONE TABLET BY MOUTH DAILY  multivitamin with minerals Tabs tablet Take 1 tablet by mouth daily.   nitroGLYCERIN 0.4 MG SL tablet Commonly known as:  NITROSTAT Place 1 tablet (0.4 mg total) under the tongue every 5 (five) minutes as needed for chest pain.   pioglitazone 45 MG tablet Commonly known as:  ACTOS Take 45 mg by mouth daily. Reported on 02/26/2015   rivaroxaban 20 MG Tabs tablet Commonly known as:  XARELTO Take 1 tablet (20 mg total) by mouth daily with supper.   SYNJARDY 05-998 MG Tabs Generic drug:  Empagliflozin-Metformin HCl TAKE ONE TABLET BY MOUTH TWICE DAILY AFTER A MEAL   tamsulosin 0.4 MG Caps capsule Commonly known as:  FLOMAX TAKE 1 CAPSULE BY MOUTH EVERY DAY AFTER SUPPER   vitamin C 1000 MG tablet Take 1,000 mg by mouth daily.   Vitamin D-3 1000 units Caps Take 1,000 Units by mouth daily. Reported on 02/26/2015       Allergies:  Allergies  Allergen Reactions  . Morphine And Related Other (See Comments)    Drenched with perspiration  . Demerol [Meperidine] Nausea Only  . Starlix [Nateglinide] Other (See Comments)    gassy    Past Medical History:  Diagnosis Date  . Adenomatous colon polyp 10/16/2011   Repeat 2018  . Arthritis     Hips, R>L & KNEES  . CAD, multiple vessel    3V CAD cath 08/08/13----CABG done shortly after.  . 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 pylori neg, no dysplasia or malignancy.  . Chronic diastolic heart failure ([place]) 2016  . Chronic renal insufficiency, stage II (mild)   . COPD (chronic obstructive pulmonary disease) ([place])    GOLD II.  Spirometry  2004 borderline obstruction; 2015 mod obst: noncompliant with bid symbicort so pulmonologist switched him to a once daily inhaler: Trelegy ellipta 12/2015.  . Diabetic nephropathy ([place])    Elevated urine  microalb/cr 03/2011  . DM type 2 (diabetes mellitus, type 2) ([facility])    Poor control on max oral meds--pt eventually agreed to insulin therap  . DOE (dyspnea on exertion)    COPD + chronic diastolic HF  . Erectile dysfunction    Normal testosterone  . Hearing loss of both ears 2016   Hearing aids  . Hyperlipidemia   . Hyperplastic colon polyp 2001  . Hypertension   . Iron deficiency anemia 2014   03/2012 capsule endoscopy showed 2 AVMs--likely responsible for his IDA--lifetime iron supp recommended + q61moCBCs.  . Macular degeneration, dry    Mild, bilat (Optometrist, [place]  . Obesity   . Open toe wound 11/2015   Wound care clinic appt made and then canceled when toe improved.  . Other and unspecified angina pectoris   . PAD (peripheral artery disease) ([place] 02/2016   Abnormal ABI's and waveforms: LE arterial duplex ordered for f/u as per cardiologist's recommendation.  .Marland KitchenPAF (paroxysmal atrial fibrillation) ([place] 10/2014   xarelto  . Pericardial effusion with cardiac tamponade 6//29/15   pericardiocentesis was done,  Infectious/inflamm (cytology showed NO MALIGNANT CELLS)  . Pleural plaque    asbestos exposure x 2 yrs: Dr. MVaughan Brownerordered CT chest for further eval on 02/2016  . Pneumonia   . Tobacco dependence in remission    100+ pack-yr hx: quit after CABG    Past Surgical History:  Procedure Laterality Date  . APPENDECTOMY  1957  . CARDIAC CATHETERIZATION  08/08/2013  . CATARACT EXTRACTION  W/ INTRAOCULAR LENS  IMPLANT, BILATERAL  04/08/2006 & 04/22/2006  . CATARACT EXTRACTION W/ INTRAOCULAR LENS  IMPLANT, BILATERAL Bilateral   . CHOLECYSTECTOMY OPEN  1999  . COLONOSCOPY  10/16/2011   Procedure: COLONOSCOPY;  Surgeon: Inda Castle, MD;  Location: [facility];  Service: Endoscopy;  Laterality: N/A;  . CORONARY ARTERY BYPASS GRAFT N/A 08/14/2013   Procedure: CORONARY ARTERY BYPASS GRAFTING (CABG);  Surgeon: Melrose Nakayama,  MD;  Location: [place];  Service: Open Heart Surgery;  Laterality: N/A;  Times 4   using left internal mammary artery and endoscopically harvested bilateral saphenous vein  . ESOPHAGOGASTRODUODENOSCOPY  02/25/12   Atrophic gastritis with a few erosions--capsule endoscopy planned as of 02/25/12 (Dr. Deatra Ina).  [name] HARDWARE REMOVAL Right 08/12/2012   Procedure: HARDWARE REMOVAL;  Surgeon: Mcarthur Rossetti, MD;  Location: [facility];  Service: Orthopedics;  Laterality: Right;  . HIP SURGERY Right 1954   Repair of slipped capital femoral epiphysis.  . INTRAOPERATIVE TRANSESOPHAGEAL ECHOCARDIOGRAM N/A 08/14/2013   Normal LV function. Procedure: INTRAOPERATIVE TRANSESOPHAGEAL ECHOCARDIOGRAM;  Surgeon: Melrose Nakayama, MD;  Location: [place];  Service: Open Heart Surgery;  Laterality: N/A;  . LEFT HEART CATHETERIZATION WITH CORONARY ANGIOGRAM N/A 08/08/2013   Procedure: LEFT HEART CATHETERIZATION WITH CORONARY ANGIOGRAM;  Surgeon: Jettie Booze, MD;  Location[facility] CATH LAB;  Service: Cardiovascular;  Laterality: N/A;  . PATELLA FRACTURE SURGERY Left ~ 1979   bolt + 3 screws to repair tib plateau fx  . PERICARDIAL TAP N/A 07/01/2013   Procedure: PERICARDIAL TAP;  Surgeon: Jettie Booze, MD;  Location: [facility] C[facility] LAB;  Service: Cardiovascular;  Laterality: N/A;  . TESTICLE SURGERY  as a child   Undescended testicle brought down into scrotum  . TONSILLECTOMY  1947  . TOTAL HIP ARTHROPLASTY Right 08/12/2012   Procedure: REMOVAL OF OLD PINS RIGHT HIP AND RIGHT TOTAL HIP ARTHROPLASTY ANTERIOR APPROACH;  Surgeon: Mcarthur Rossetti, MD;  Location: [facility];  Service: Orthopedics;  Laterality: Right;  . TRANSTHORACIC ECHOCARDIOGRAM  10/30/14   Mod LVH, EF 60-65%, normal wall motion, mod mitral regurg, mild PAH    Family History  Problem Relation Age of Onset  . Heart disease Father   . Heart attack Father   . CVA Mother   . Hypertension Mother   . Diabetes Paternal Grandmother   . Breast cancer  Sister   . Diabetes Maternal Uncle   . Heart disease Maternal Uncle   . Stroke Neg Hx     Social History:  reports that he quit smoking about 4 months ago. His smoking use included Cigarettes. He has a 15.50 pack-year smoking history. He quit smokeless tobacco use about 2 years ago. He reports that he does not drink alcohol or use drugs.   Review of Systems   Lipid history: He had been prescribed Lipitor previously  and has been prescribed this again in November by PCP, does not appear to be taking this now    Lab Results  Component Value Date   CHOL 119 02/19/2016   HDL 43.30 02/19/2016   LDLCALC 44 02/19/2016   LDLDIRECT 187.0 11/21/2015   TRIG 159.0 (H) 02/19/2016   CHOLHDL 3 02/19/2016           Hypertension:Currently treated with amlodipine 5 mg, diltiazem 240 mg, not on ACE inhibitor or ARB He takes his blood pressure With his recent instrument but this was reading lower day in the office instrument when seen by nurse educator  BP Readings from Last 3 Encounters:  03/10/16 126/60  03/04/16 112/60  02/19/16 125/74     Most recent eye exam was 7/17, Dr Marina Gravel? Has had no retinopathy  Most recent foot exam: 12/17    LABS:  No visits with results within 1 Week(s) from this visit.  Latest known visit with results is:  Office Visit on 02/19/2016  Component Date Value Ref Range Status  . Sodium 02/19/2016 141  135 - 145 mEq/L Final  . Potassium 02/19/2016 4.7  3.5 - 5.1 mEq/L Final  . Chloride 02/19/2016 105  96 - 112 mEq/L Final  . CO2 02/19/2016 29  19 - 32 mEq/L Final  . Glucose, Bld 02/19/2016 144* 70 - 99 mg/dL Final  . BUN 02/19/2016 23  6 - 23 mg/dL Final  . Creatinine, Ser 02/19/2016 1.00  0.40 - 1.50 mg/dL Final  . Total Bilirubin 02/19/2016 0.6  0.2 - 1.2 mg/dL Final  . Alkaline Phosphatase 02/19/2016 107  39 - 117 U/L Final  . AST 02/19/2016 18  0 - 37 U/L Final  . ALT 02/19/2016 24  0 - 53 U/L Final  . Total Protein 02/19/2016 6.6  6.0 - 8.3 g/dL  Final  . Albumin 02/19/2016 4.3  3.5 - 5.2 g/dL Final  . Calcium 02/19/2016 9.3  8.4 - 10.5 mg/dL Final  . GFR 02/19/2016 77.04  >60.00 mL/min Final  . Cholesterol 02/19/2016 119  0 - 200 mg/dL Final  . Triglycerides 02/19/2016 159.0* 0.0 - 149.0 mg/dL Final  . HDL 02/19/2016 43.30  >39.00 mg/dL Final  . VLDL 02/19/2016 31.8  0.0 - 40.0 mg/dL Final  . LDL Cholesterol 02/19/2016 44  0 - 99 mg/dL Final  . Total CHOL/HDL Ratio 02/19/2016 3   Final  . NonHDL 02/19/2016 76.00   Final    Physical Examination:  BP 126/60   Pulse 88   Ht _0  (1.803 m)   Wt 256 lb (116.1 kg)   SpO2 95%   BMI 35.70 kg/m       ASSESSMENT:  Diabetes type 2, uncontrolled    See history of present illness for detailed discussion of current diabetes management, blood sugar patterns and problems identified A1c is still high at 9.5 reportedly last month with PCP  His blood sugars are mostly high after meals but only able to assess his patterns based on his checking about 30 minutes after supper Not clear if he is having higher readings after lunch and if he needs more basal insulin during the day He did have one high reading after breakfast Despite increasing his suppertime dose to 30 units his still seems to have high readings later in the evening He does not understand the need to check blood sugars 2 hours after eating and to take insulin doses 30 min before eating Day-to-day management discussed in detail today  Fasting readings are low-normal  HYPERTENSION: Blood pressure is fairly good   HYPERLIPIDEMIA: He has good control with resuming his Lipitor   PLAN:     He will start checking blood sugars by rotation at different times of the day instead of just at bedtime and on waking up  He will try to take his regular insulin consistently at least 15 minutes before eating rather than after  He will again increase the Levemir by 5 units in the morning and reduce the dose in the evening by 5  units  He may need to adjust his suppertime dose based on his meal size  and discussed need to be in his blood sugars after supper to help adjust her mealtime doses  He will target his blood sugars to be at least under 180 after supper  Change Lantus to Toujeo 70 units in the morning when his supply runs out  Limit amount of carbohydrates and have some protein with every meal especially breakfast  Review blood sugar patterns in 1 month Detail instructions as below and patient instructions  There are no Patient Instructions on file for this visit.  Counseling time on subjects discussed above is over 50% of today's 25 minute visit   Laylani Pudwill 03/10/2016, 8:41 AM   Note: This office note was prepared with Estate agent. Any transcriptional errors that result from this process are unintentional.

## 2016-03-10 NOTE — Patient Instructions (Addendum)
  TESTING your blood sugar:  Check blood sugar in the morning waking up every other day  Check one blood sugar daily at least by rotation about 2 hours after either breakfast, lunch or dinner   LEVEMIR insulin: Increase the dose to 50 units in the morning and reduce the evening dose to 25 units, may take evening dose before suppertime with the other insulin  CALL before refilling the Levemir in the next visit, this will be changed to the [facility] which is a slightly concentrated insulin to be taken 70 units in the morning once a day only  NOVOLIN R insulin: This must be taken at least 15 minutes, up to 30 minutes before eating and not after  Continue taking 24 units before breakfast and lunch and 30 units before supper  If the sugars are still over 200 about 2 hours after dinnertime may need to increase the regular insulin by another 4 units

## 2016-03-11 ENCOUNTER — Telehealth: Payer: Self-pay | Admitting: Family Medicine

## 2016-03-11 NOTE — Telephone Encounter (Signed)
Patient needs more insulin 31 gauge syringes 5/16 length. Please call patient at home number if there are any questions. Send to:  [place], [place] - [address] [phone] (Phone) [phone] (Fax)

## 2016-03-11 NOTE — Telephone Encounter (Signed)
SW pharmacist at [facility] and she state that pt still has active Rx and did not contact them fo refill. She stated that she will get it ready for pt. Pt advised and voiced understanding.

## 2016-03-18 ENCOUNTER — Other Ambulatory Visit: Payer: Self-pay | Admitting: Family Medicine

## 2016-03-19 ENCOUNTER — Other Ambulatory Visit: Payer: Self-pay | Admitting: Interventional Cardiology

## 2016-03-19 NOTE — Telephone Encounter (Signed)
Received request for Xarelto 78m; pt was last seen by Dr. VIrish Lackon 01/31/16, Crea-1.00 on 02/19/16, Wt-115.2kg, CrCl-102.457mmin. Will send in refill request.

## 2016-03-23 ENCOUNTER — Ambulatory Visit (HOSPITAL_COMMUNITY)
Admission: RE | Admit: 2016-03-23 | Discharge: 2016-03-23 | Disposition: A | Discharge status: Home / Self Care | Payer: Medicare Other | Source: Ambulatory Visit | Attending: Cardiovascular Disease | Admitting: Cardiovascular Disease

## 2016-03-23 DIAGNOSIS — I2510 Atherosclerotic heart disease of native coronary artery without angina pectoris: Secondary | ICD-10-CM | POA: Diagnosis not present

## 2016-03-23 DIAGNOSIS — R6889 Other general symptoms and signs: Secondary | ICD-10-CM

## 2016-03-23 DIAGNOSIS — Z8673 Personal history of transient ischemic attack (TIA), and cerebral infarction without residual deficits: Secondary | ICD-10-CM | POA: Insufficient documentation

## 2016-03-23 DIAGNOSIS — I10 Essential (primary) hypertension: Secondary | ICD-10-CM | POA: Insufficient documentation

## 2016-03-23 DIAGNOSIS — R0989 Other specified symptoms and signs involving the circulatory and respiratory systems: Secondary | ICD-10-CM

## 2016-03-23 DIAGNOSIS — E1151 Type 2 diabetes mellitus with diabetic peripheral angiopathy without gangrene: Secondary | ICD-10-CM | POA: Insufficient documentation

## 2016-03-23 DIAGNOSIS — I70203 Unspecified atherosclerosis of native arteries of extremities, bilateral legs: Secondary | ICD-10-CM | POA: Insufficient documentation

## 2016-03-23 DIAGNOSIS — E785 Hyperlipidemia, unspecified: Secondary | ICD-10-CM | POA: Diagnosis not present

## 2016-03-23 DIAGNOSIS — Z87891 Personal history of nicotine dependence: Secondary | ICD-10-CM | POA: Diagnosis not present

## 2016-03-23 DIAGNOSIS — J449 Chronic obstructive pulmonary disease, unspecified: Secondary | ICD-10-CM | POA: Diagnosis not present

## 2016-03-23 DIAGNOSIS — Z951 Presence of aortocoronary bypass graft: Secondary | ICD-10-CM | POA: Insufficient documentation

## 2016-03-23 DIAGNOSIS — I739 Peripheral vascular disease, unspecified: Secondary | ICD-10-CM

## 2016-03-25 DIAGNOSIS — J449 Chronic obstructive pulmonary disease, unspecified: Secondary | ICD-10-CM | POA: Diagnosis not present

## 2016-03-30 ENCOUNTER — Telehealth: Payer: Self-pay | Admitting: Family Medicine

## 2016-03-30 DIAGNOSIS — E1151 Type 2 diabetes mellitus with diabetic peripheral angiopathy without gangrene: Secondary | ICD-10-CM

## 2016-03-30 NOTE — Telephone Encounter (Signed)
OK, vascular surgery referral ordered.

## 2016-03-30 NOTE — Telephone Encounter (Signed)
Pls notify pt that the testing of the blood flow through the arteries of his legs showed he has a moderate amount of blockage in several areas. He needs further evaluation by a vascular specialist. Let me know if pt is agreeable, then I'll order referral.--thx

## 2016-03-30 NOTE — Telephone Encounter (Signed)
Tried calling Joshua [name] and unable to leave a message.

## 2016-03-30 NOTE — Telephone Encounter (Signed)
Please advise. Thanks.  

## 2016-03-30 NOTE — Telephone Encounter (Signed)
Patient checking to see if we got the results from recent US. Patient okay with [place] giving the results to wife.

## 2016-03-30 NOTE — Telephone Encounter (Signed)
Pt advised and voiced understanding.  He stated that he is okay with referral to a vascular specialist.

## 2016-04-06 NOTE — Progress Notes (Addendum)
Patient ID: Joshua [name], male   DOB: 1940-04-05, 76 y.o.   MRN: [id number]            Reason for Appointment:  Follow-up for Type 2 Diabetes  Referring physician: [place]   History of Present Illness:          Date of diagnosis of type 2 diabetes mellitus: ?  2002        Background history:   He apparently had been treated with metformin initially which was continued until about 2016. He previously has also been tried on Amaryl, Actos, Farxiga and Victoza but he claims that these did not help his blood sugar and were stopped Appears to have been on insulin since about 2014 but he does not remember details. He has been taking mostly Levemir insulin as a basal insulin but was also on Lantus in 2015 for some time His best A1c has been 7.3, once in 2014 and another time in 2016, otherwise his A1c has been higher and as high as 10.8  Recent history:   INSULIN regimen is:   Levemir 50--35 , Novolin R 24 tid units  or pc meals      Non-insulin hypoglycemic drugs the patient is taking are: Actos 45 mg daily, Synjardy 05/998 twice a day   Current management, blood sugar patterns and problems identified:  He was given very specific instructions which were explained to him in detail on his last visit about taking his evening Regular Insulin before eating but he still has not changed his routine of taking his blood sugar and insulin doses 2-3 hours after eating at night  Again as a result his blood sugars are significantly high after supper  With taking his regular insulin late at night he is occasionally getting low sugars at 1-2 AM again  He does not check after breakfast or lunch despite reminders  FASTING blood sugars are excellent and not as low as before  Has had about 3 low blood sugars around 1-3 AM, mostly in the 60s  Again most of his readings right after supper are significantly high  He is still taking Levemir with a syringe and he says he still has 3 bottles at  home  Has been instructed by dietitian but again has gained weight  No hypoglycemia, he will have symptoms when he has low blood sugars          Side effects from medications have been: None  Compliance with the medical regimen: Fair Hypoglycemia: Never    Glucose monitoring:  done  times a day         Glucometer:  Accu-Chek .      Blood Glucose readings from download as below  Mean values apply above for all meters except median for One Touch  PRE-MEAL Fasting Lunch Dinner Bedtime Overall  Glucose range: 67-1 72  131  256, 145     Mean/median: 111     168    POST-MEAL PC Breakfast PC Lunch PC Dinner  Glucose range:    1 67-375   Mean/median:   235     Self-care: The diet that the patient has been following is: tries to limit Sweet drinks .     Meal times are:  Breakfast is at 7 a.m.  Dinner: 5-6   Typical meal intake: Breakfast is usually eggs,  Sausage, oatmeal or grits.  Lunch is a sandwich, any evening he has meat and vegetables, For snacks he will have fruit or  crackers                Dietician visit, most recent: 01/09/16 CDE visit: 12/18               Exercise: Unable to do any     Weight history:  Wt Readings from Last 3 Encounters:  04/07/16 253 lb (114.8 kg)  03/10/16 256 lb (116.1 kg)  03/04/16 249 lb (112.9 kg)    Glycemic control:   Lab Results  Component Value Date   HGBA1C 10.4 (H) 11/21/2015   HGBA1C 9.2 (H) 08/21/2015   HGBA1C 8.8 (H) 05/17/2015   Lab Results  Component Value Date   MICROALBUR 4.6 (H) 12/17/2014   LDLCALC 44 02/19/2016   CREATININE 1.00 02/19/2016   Lab Results  Component Value Date   MICRALBCREAT 6.4 12/17/2014    Other active problems: See review of systems   Allergies as of 04/07/2016      Reactions   Morphine And Related Other (See Comments)   Drenched with perspiration   Demerol [meperidine] Nausea Only   Starlix [nateglinide] Other (See Comments)   gassy      Medication List       Accurate as of 04/07/16   9:27 AM. Always use your most recent med list.          acetaminophen 325 MG tablet Commonly known as:  TYLENOL Take 2 tablets (650 mg total) by mouth every 4 (four) hours as needed for headache or mild pain.   amLODipine 5 MG tablet Commonly known as:  NORVASC Take 1 tablet (5 mg total) by mouth daily.   aspirin 81 MG tablet Take 81 mg by mouth at bedtime. Reported on 02/26/2015   atorvastatin 40 MG tablet Commonly known as:  LIPITOR TAKE ONE TABLET BY MOUTH EVERY DAY   budesonide-formoterol 160-4.5 MCG/ACT inhaler Commonly known as:  SYMBICORT Inhale 2 puffs into the lungs 2 (two) times daily.   budesonide-formoterol 160-4.5 MCG/ACT inhaler Commonly known as:  SYMBICORT Inhale 2 puffs into the lungs 2 (two) times daily.   buPROPion 150 MG 12 hr tablet Commonly known as:  WELLBUTRIN SR TAKE ONE TABLET BY MOUTH TWICE DAILY   Chromium Picolinate 1000 MCG Tabs Take 1,000 mcg by mouth daily. Reported on 02/26/2015   citalopram 40 MG tablet Commonly known as:  CELEXA Take 1 tablet (40 mg total) by mouth daily.   diltiazem 240 MG 24 hr capsule Commonly known as:  CARDIZEM CD Take 1 capsule (240 mg total) by mouth daily.   fluticasone 50 MCG/ACT nasal spray Commonly known as:  FLONASE Place 2 sprays into both nostrils daily.   furosemide 40 MG tablet Commonly known as:  LASIX Take 40 mg by mouth daily.   gabapentin 100 MG capsule Commonly known as:  NEURONTIN TAKE 2 CAPSULES BY MOUTH 3 TIMES DAILY   guaiFENesin 600 MG 12 hr tablet Commonly known as:  MUCINEX Take 2 tablets (1,200 mg total) by mouth 2 (two) times daily.   Insulin Detemir 100 UNIT/ML Pen Commonly known as:  LEVEMIR FLEXTOUCH Inject 80 Units into the  skin at bedtime.  Change site of injection with every use.   Insulin Pen Needle 32G X 5 MM Misc Use to inject insulin TID as directed by physician.   insulin regular 250 units/2.31m (100 units/mL) injection Commonly known as:  NOVOLIN R,HUMULIN  R Inject 16 units SQ with BF, 17 units SQ with Lunch, and 18 U SQ with Supper   isosorbide  mononitrate 30 MG 24 hr tablet Commonly known as:  IMDUR TAKE ONE TABLET BY MOUTH DAILY   multivitamin with minerals Tabs tablet Take 1 tablet by mouth daily.   nitroGLYCERIN 0.4 MG SL tablet Commonly known as:  NITROSTAT Place 1 tablet (0.4 mg total) under the tongue every 5 (five) minutes as needed for chest pain.   pioglitazone 45 MG tablet Commonly known as:  ACTOS Take 45 mg by mouth daily. Reported on 02/26/2015   PROAIR HFA 108 (90 Base) MCG/ACT inhaler Generic drug:  albuterol INHALE 2 PUFFS INTO THE LUNGS EVERY 4 HOURS AS NEEDED FOR WHEEZING ORSHORTNESS OF BREATH   rivaroxaban 20 MG Tabs tablet Commonly known as:  XARELTO Take 1 tablet (20 mg total) by mouth daily with supper.   SYNJARDY 05-998 MG Tabs Generic drug:  Empagliflozin-Metformin HCl TAKE ONE TABLET BY MOUTH TWICE DAILY AFTER A MEAL   tamsulosin 0.4 MG Caps capsule Commonly known as:  FLOMAX TAKE 1 CAPSULE BY MOUTH EVERY DAY AFTER SUPPER   vitamin C 1000 MG tablet Take 1,000 mg by mouth daily.   Vitamin D-3 1000 units Caps Take 1,000 Units by mouth daily. Reported on 02/26/2015       Allergies:  Allergies  Allergen Reactions  . Morphine And Related Other (See Comments)    Drenched with perspiration  . Demerol [Meperidine] Nausea Only  . Starlix [Nateglinide] Other (See Comments)    gassy    Past Medical History:  Diagnosis Date  . Adenomatous colon polyp 10/16/2011   Repeat 2018  . Arthritis     Hips, R>L & KNEES  . CAD, multiple vessel    3V CAD cath 08/08/13----CABG done shortly after.  . 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 pylori neg, no dysplasia or malignancy.  . Chronic diastolic heart failure ([place]) 2016  . Chronic renal insufficiency, stage II (mild)   . COPD (chronic obstructive pulmonary disease) ([place])    GOLD II.  Spirometry  2004 borderline  obstruction; 2015 mod obst: noncompliant with bid symbicort so pulmonologist switched him to a once daily inhaler: Trelegy ellipta 12/2015.  . Diabetic nephropathy ([place])    Elevated urine microalb/cr 03/2011  . DM type 2 (diabetes mellitus, type 2) ([facility])    Poor control on max oral meds--pt eventually agreed to insulin therap  . DOE (dyspnea on exertion)    COPD + chronic diastolic HF  . Erectile dysfunction    Normal testosterone  . Hearing loss of both ears 2016   Hearing aids  . Hyperlipidemia   . Hyperplastic colon polyp 2001  . Hypertension   . Iron deficiency anemia 2014   03/2012 capsule endoscopy showed 2 AVMs--likely responsible for his IDA--lifetime iron supp recommended + q36moCBCs.  . Macular degeneration, dry    Mild, bilat (Optometrist, [place]  . Obesity   . Open toe wound 11/2015   Wound care clinic appt made and then canceled when toe improved.  . Other and unspecified angina pectoris   . PAD (peripheral artery disease) ([place] 02/2016   Abnormal ABI's and waveforms: LE arterial duplex ordered for f/u as per cardiologist's recommendation.  .Marland KitchenPAF (paroxysmal atrial fibrillation) ([place] 10/2014   xarelto  . Pericardial effusion with cardiac tamponade 6//29/15   pericardiocentesis was done,  Infectious/inflamm (cytology showed NO MALIGNANT CELLS)  . Pleural plaque    asbestos exposure x 2 yrs: Dr. MVaughan Brownerordered CT chest for  further eval on 02/2016  . Pneumonia   . Tobacco dependence in remission    100+ pack-yr hx: quit after CABG    Past Surgical History:  Procedure Laterality Date  . APPENDECTOMY  1957  . CARDIAC CATHETERIZATION  08/08/2013  . CATARACT EXTRACTION W/ INTRAOCULAR LENS  IMPLANT, BILATERAL  04/08/2006 & 04/22/2006  . CATARACT EXTRACTION W/ INTRAOCULAR LENS  IMPLANT, BILATERAL Bilateral   . CHOLECYSTECTOMY OPEN  1999  . COLONOSCOPY  10/16/2011   Procedure: COLONOSCOPY;  Surgeon: Inda Castle, MD;   Location: [facility];  Service: Endoscopy;  Laterality: N/A;  . CORONARY ARTERY BYPASS GRAFT N/A 08/14/2013   Procedure: CORONARY ARTERY BYPASS GRAFTING (CABG);  Surgeon: Melrose Nakayama, MD;  Location: [facility];  Service: Open Heart Surgery;  Laterality: N/A;  Times 4   using left internal mammary artery and endoscopically harvested bilateral saphenous vein  . ESOPHAGOGASTRODUODENOSCOPY  02/25/12   Atrophic gastritis with a few erosions--capsule endoscopy planned as of 02/25/12 (Dr. Deatra Ina).  [name] HARDWARE REMOVAL Right 08/12/2012   Procedure: HARDWARE REMOVAL;  Surgeon: Mcarthur Rossetti, MD;  Location: [facility];  Service: Orthopedics;  Laterality: Right;  . HIP SURGERY Right 1954   Repair of slipped capital femoral epiphysis.  . INTRAOPERATIVE TRANSESOPHAGEAL ECHOCARDIOGRAM N/A 08/14/2013   Normal LV function. Procedure: INTRAOPERATIVE TRANSESOPHAGEAL ECHOCARDIOGRAM;  Surgeon: Melrose Nakayama, MD;  Location: [facility];  Service: Open Heart Surgery;  Laterality: N/A;  . LEFT HEART CATHETERIZATION WITH CORONARY ANGIOGRAM N/A 08/08/2013   Procedure: LEFT HEART CATHETERIZATION WITH CORONARY ANGIOGRAM;  Surgeon: Jettie Booze, MD;  Location: [facility] CATH LAB;  Service: Cardiovascular;  Laterality: N/A;  . PATELLA FRACTURE SURGERY Left ~ 1979   bolt + 3 screws to repair tib plateau fx  . PERICARDIAL TAP N/A 07/01/2013   Procedure: PERICARDIAL TAP;  Surgeon: Jettie Booze, MD;  Location[facility] C[facility] LAB;  Service: Cardiovascular;  Laterality: N/A;  . TESTICLE SURGERY  as a child   Undescended testicle brought down into scrotum  . TONSILLECTOMY  1947  . TOTAL HIP ARTHROPLASTY Right 08/12/2012   Procedure: REMOVAL OF OLD PINS RIGHT HIP AND RIGHT TOTAL HIP ARTHROPLASTY ANTERIOR APPROACH;  Surgeon: Mcarthur Rossetti, MD;  Location: [facility];  Service: Orthopedics;  Laterality: Right;  . TRANSTHORACIC ECHOCARDIOGRAM  10/30/14   Mod LVH, EF 60-65%, normal wall motion, mod mitral regurg, mild PAH     Family History  Problem Relation Age of Onset  . Heart disease Father   . Heart attack Father   . CVA Mother   . Hypertension Mother   . Diabetes Paternal Grandmother   . Breast cancer Sister   . Diabetes Maternal Uncle   . Heart disease Maternal Uncle   . Stroke Neg Hx     Social History:  reports that he quit smoking about 5 months ago. His smoking use included Cigarettes. He has a 15.50 pack-year smoking history. He quit smokeless tobacco use about 2 years ago. He reports that he does not drink alcohol or use drugs.   Review of Systems   Lipid history: He had been prescribed Lipitor previously  and has been prescribed this again in November by PCP, does not appear to be taking this now    Lab Results  Component Value Date   CHOL 119 02/19/2016   HDL 43.30 02/19/2016   LDLCALC 44 02/19/2016   LDLDIRECT 187.0 11/21/2015   TRIG 159.0 (H) 02/19/2016   CHOLHDL 3 02/19/2016  Hypertension:Currently treated with amlodipine 5 mg, diltiazem 240 mg, not on ACE inhibitor or ARB He takes his blood pressure With his recent instrument but this was reading lower day in the office instrument when seen by nurse educator    BP Readings from Last 3 Encounters:  04/07/16 132/60  03/10/16 126/60  03/04/16 112/60     Most recent eye exam was 7/17, Dr Marina Gravel? Has had no retinopathy  Most recent foot exam: 12/17    LABS:  No visits with results within 1 Week(s) from this visit.  Latest known visit with results is:  Office Visit on 02/19/2016  Component Date Value Ref Range Status  . Sodium 02/19/2016 141  135 - 145 mEq/L Final  . Potassium 02/19/2016 4.7  3.5 - 5.1 mEq/L Final  . Chloride 02/19/2016 105  96 - 112 mEq/L Final  . CO2 02/19/2016 29  19 - 32 mEq/L Final  . Glucose, Bld 02/19/2016 144* 70 - 99 mg/dL Final  . BUN 02/19/2016 23  6 - 23 mg/dL Final  . Creatinine, Ser 02/19/2016 1.00  0.40 - 1.50 mg/dL Final  . Total Bilirubin 02/19/2016 0.6  0.2 - 1.2  mg/dL Final  . Alkaline Phosphatase 02/19/2016 107  39 - 117 U/L Final  . AST 02/19/2016 18  0 - 37 U/L Final  . ALT 02/19/2016 24  0 - 53 U/L Final  . Total Protein 02/19/2016 6.6  6.0 - 8.3 g/dL Final  . Albumin 02/19/2016 4.3  3.5 - 5.2 g/dL Final  . Calcium 02/19/2016 9.3  8.4 - 10.5 mg/dL Final  . GFR 02/19/2016 77.04  >60.00 mL/min Final  . Cholesterol 02/19/2016 119  0 - 200 mg/dL Final  . Triglycerides 02/19/2016 159.0* 0.0 - 149.0 mg/dL Final  . HDL 02/19/2016 43.30  >39.00 mg/dL Final  . VLDL 02/19/2016 31.8  0.0 - 40.0 mg/dL Final  . LDL Cholesterol 02/19/2016 44  0 - 99 mg/dL Final  . Total CHOL/HDL Ratio 02/19/2016 3   Final  . NonHDL 02/19/2016 76.00   Final    Physical Examination:  BP 132/60   Pulse 85   Ht _0  (1.803 m)   Wt 253 lb (114.8 kg)   BMI 35.29 kg/m       ASSESSMENT:  Diabetes type 2, uncontrolled    See history of present illness for detailed discussion of current diabetes management, blood sugar patterns and problems identified His last A1c was 10.4  His blood sugars are mostly high after evening meals but this is because of his continuing to take his regular insulin late at night instead of before eating despite previous instructions as discussed above He also is not able to change his routine habits with checking his sugar at breakfast and bedtime  His day-to-day management, and insulin timing, glucose monitoring, action profiles of types of insulin was discussed in detail today  To get more complete information will have him get a FreeStyle professional sensor installed today, this will allow [place] more complete analysis of his patterns and also since he will keep a diary will know what he is doing with his diet and insulin regimen   HYPERTENSION: Blood pressure is good   HYPERLIPIDEMIA: He has good control with resuming his Lipitor   PLAN:     He will have the freestyle [place] sensor attached today by the nurse educator to do a  professional regarding of his blood sugar patterns  At this time he will keep this for 2 weeks  and also keep a record of his insulin and food diary  For simplicity will take his evening insulin doses before supper and he can take Levemir at the same time since he cannot follow complex instructions  Again reminded him to call when he is finishing his Levemir and he will change this to Toujeo 70 units in the morning, discussed how this is different; he says he can use insulin pen which she has done at some point before  He will try to take a regular insulin 30 minutes for eating at all times  Also try to check readings around lunchtime at suppertime  He will not make any adjustments to the regular insulin until next visit  Review blood sugar patterns from sensor download and home monitoring in 1 month  A1c to be checked with his labs and about 2 weeks  Patient instructions as below, this was also reviewed with his daughter-in-law was present today  LEVEMIR insulin: 50 units in the morning Continue evening dose 25 units, please take this at suppertime with the Novolin R   CALL before refilling the Levemir insulin this will be changed to the [facility] which is a 24-hour insulin to be taken 70 units in the morning once a day only with an insulin pen   NOVOLIN R insulin: This must be taken at least 15 minutes, up to 30 minutes before eating and not after including at suppertime   Continue taking 24 units before breakfast and lunch and 30 units BEFORE supper   Keep a diary of the doses of insulin and diet as explained for the continuous glucose sensor     Patient Instructions  LEVEMIR insulin: 50 units in the morning Continue evening dose 25 units, please take this at suppertime with the Novolin R   CALL before refilling the Levemir insulin this will be changed to the [facility] which is a 24-hour insulin to be taken 70 units in the morning once a day only with an insulin pen   NOVOLIN R  insulin: This must be taken at least 15 minutes, up to 30 minutes before eating and not after including at suppertime   Continue taking 24 units before breakfast and lunch and 30 units BEFORE supper   Keep a diary of the doses of insulin and diet as explained for the continuous glucose sensor      Counseling time on subjects discussed above is over 50% of today's 25 minute visit   Ryin Schillo 04/07/2016, 9:27 AM   Note: This office note was prepared with Estate agent. Any transcriptional errors that result from this process are unintentional.  Addendum on 04/17/16:  Donata Clay   Analysis of freestyle [place] sensor  Dates of recording: April 3-04/11/2016  CONTINUOUS GLUCOSE MONITORING RECORD INTERPRETATION      Sensor  summary:  Average blood glucose for the period of recording is 164 with standard deviation 55, has 62% of readings above 140 and 1% below 70       Glycemic patterns:  Hyperglycemic episodes Are occurring in the early morning and also after about 1 PM with blood sugars as high as 270 at 9 AM Hypoglycemic episodes did not occur      Overnight periods:  Blood sugars are declining after about 11 PM with the lowest readings around 3-4 AM averaging about 120 and then rising significantly after 5 AM      Preprandial periods:  Blood sugars are moderately high around 7 AM on  an average with variability Lunchtime blood sugars are also similarly high at about 200 Blood sugars at suppertime at 5 PM are averaging about 150      Postprandial periods:  Blood sugars are on the rise around 7 AM and have been variably high readings after breakfast and also may continue to be higher mid-day, only occasionally has a rising blood sugar after lunch such as on Sunday the eighth      Hypoglycemia: Minimal with lowest glucose on April 5 at about 3 AM        Recommendations:  Change Levemir to Toujeo to provide more consistent 24 control Increase  coverage at breakfast and lunch by 4-6 units and reduce suppertime dose by 4 units

## 2016-04-07 ENCOUNTER — Telehealth: Payer: Self-pay | Admitting: Nutrition

## 2016-04-07 ENCOUNTER — Ambulatory Visit (INDEPENDENT_AMBULATORY_CARE_PROVIDER_SITE_OTHER): Payer: Medicare Other | Admitting: Endocrinology

## 2016-04-07 ENCOUNTER — Encounter: Payer: Self-pay | Admitting: Endocrinology

## 2016-04-07 VITALS — BP 132/60 | HR 85 | Ht 71.0 in | Wt 253.0 lb

## 2016-04-07 DIAGNOSIS — E1165 Type 2 diabetes mellitus with hyperglycemia: Secondary | ICD-10-CM

## 2016-04-07 DIAGNOSIS — Z794 Long term (current) use of insulin: Secondary | ICD-10-CM | POA: Diagnosis not present

## 2016-04-07 NOTE — Patient Instructions (Signed)
LEVEMIR insulin: 50 units in the morning Continue evening dose 25 units, please take this at suppertime with the Novolin R   CALL before refilling the Levemir insulin this will be changed to the [facility] which is a 24-hour insulin to be taken 70 units in the morning once a day only with an insulin pen   NOVOLIN R insulin: This must be taken at least 15 minutes, up to 30 minutes before eating and not after including at suppertime   Continue taking 24 units before breakfast and lunch and 30 units BEFORE supper   Keep a diary of the doses of insulin and diet as explained for the continuous glucose sensor

## 2016-04-07 NOTE — Telephone Encounter (Signed)
Freestyle [place] professional sensor was inserted into his left upper outer arm.  He reported no discomfort.  We reviewed his insulin doses with his daughter and the patient and they had no final questions.  2 minute sensor warm up showed sensor was working appropriately.   Pt. Is aware that he is to return in 2 weeks for removal of this sensor. They had no final questions.

## 2016-04-09 ENCOUNTER — Encounter: Payer: Self-pay | Admitting: Family Medicine

## 2016-04-09 ENCOUNTER — Encounter: Payer: Self-pay | Admitting: Vascular Surgery

## 2016-04-16 ENCOUNTER — Telehealth: Payer: Self-pay | Admitting: Endocrinology

## 2016-04-16 NOTE — Telephone Encounter (Signed)
Sensor will not work by sticking it back on, he can bring it in any day and either [place] or Vaughan Basta can download it

## 2016-04-16 NOTE — Telephone Encounter (Signed)
Spoke with pt, he is coming in this afternoon for Jinny Blossom to download his sensor.

## 2016-04-16 NOTE — Telephone Encounter (Signed)
The sensor came off yesterday, he did put it back on. Should be come in to be seen?

## 2016-04-17 ENCOUNTER — Telehealth: Payer: Self-pay | Admitting: Endocrinology

## 2016-04-17 ENCOUNTER — Other Ambulatory Visit: Payer: Self-pay | Admitting: Endocrinology

## 2016-04-17 MED ORDER — INSULIN GLARGINE 300 UNIT/ML ~~LOC~~ SOPN: 70.0000 [IU] | PEN_INJECTOR | Freq: Every day | SUBCUTANEOUS | 1 refills | Status: DC

## 2016-04-17 NOTE — Telephone Encounter (Signed)
LM for pt to call back to discuss the changes

## 2016-04-17 NOTE — Telephone Encounter (Signed)
Pt is calling back # [phone]

## 2016-04-17 NOTE — Telephone Encounter (Signed)
His sensor download was reviewed and please let them know insulin recommendations:  Change Levemir to Toujeo 70 units in the morning once a day, prescription has been sent REGULAR insulin to be taken 28 units, 30 min before breakfast lunch and dinner

## 2016-04-17 NOTE — Progress Notes (Signed)
Patient is aware 

## 2016-04-17 NOTE — Progress Notes (Unsigned)
Donata Clay   Analysis of freestyle [place] sensor  Dates of recording: April 3-04/11/2016  CONTINUOUS GLUCOSE MONITORING RECORD INTERPRETATION      Sensor  summary:  Average blood glucose for the period of recording is 164 with standard deviation 55, has 62% of readings above 140 and 1% below 70       Glycemic patterns:  Hyperglycemic episodes Are occurring in the early morning and also after about 1 PM with blood sugars as high as 270 at 9 AM Hypoglycemic episodes did not occur      Overnight periods:  Blood sugars are declining after about 11 PM with the lowest readings around 3-4 AM averaging about 120 and then rising significantly after 5 AM      Preprandial periods:  Blood sugars are moderately high around 7 AM on an average with variability Lunchtime blood sugars are also similarly high at about 200 Blood sugars at suppertime at 5 PM are averaging about 150      Postprandial periods:  Blood sugars are on the rise around 7 AM and have been variably high readings after breakfast and also may continue to be higher mid-day, only occasionally has a rising blood sugar after lunch such as on Sunday the eighth      Hypoglycemia: Minimal with lowest glucose on April 5 at about 3 AM        Recommendations:  Change Levemir to Toujeo to provide more consistent 24 control Increase coverage at breakfast and lunch by 4-6 units and reduce suppertime dose by 4 units

## 2016-04-20 ENCOUNTER — Other Ambulatory Visit: Payer: Self-pay

## 2016-04-20 MED ORDER — INSULIN GLARGINE 300 UNIT/ML ~~LOC~~ SOPN: 70.0000 [IU] | PEN_INJECTOR | Freq: Every day | SUBCUTANEOUS | 1 refills | Status: DC

## 2016-04-20 NOTE — Telephone Encounter (Signed)
Spoke with patient and he stated he understands the change in medication- patient wants to know if it is ok to get 30 day supply because he stated he only got 19 day supply from pharmacy- please advise

## 2016-04-20 NOTE — Telephone Encounter (Signed)
Is he discussing the prescription for Toujeo?  30 day supply is okay  Also he can be taken off the scheduled for [facility] for the 18th

## 2016-04-20 NOTE — Telephone Encounter (Signed)
Called again could not reach on home phone or mobile

## 2016-04-21 ENCOUNTER — Encounter: Payer: Self-pay | Admitting: Vascular Surgery

## 2016-04-21 ENCOUNTER — Ambulatory Visit (INDEPENDENT_AMBULATORY_CARE_PROVIDER_SITE_OTHER): Payer: Medicare Other | Admitting: Vascular Surgery

## 2016-04-21 VITALS — BP 130/69 | HR 90 | Temp 98.4°F | Resp 20 | Ht 71.0 in | Wt 252.0 lb

## 2016-04-21 DIAGNOSIS — I779 Disorder of arteries and arterioles, unspecified: Secondary | ICD-10-CM

## 2016-04-21 NOTE — Progress Notes (Signed)
Referred by:  Tammi Sou, MD [address] [place], [place] [id number]  Reason for referral: abnormal ABI   History of Present Illness  Joshua [name] is a 76 y.o. (1940-12-23) male who presents with chief complaint: left leg neuropathic pain.  Onset of symptom occurred this year.  Pain is described as neuropathic in character and intermittent, severity 1-5/10, and associated with no obvious trigger.  Patient has attempted to treat this pain with conservative measures and Neurontin.  This patient was recently sent for BLE Physiologic studies which were reportedly abnormal.   The patient has no rest pain and no intermittent claudication.  Atherosclerotic risk factors include: HTN, HLD, DM, and active smoker until last year.   Past Medical History:  Diagnosis Date  . Adenomatous colon polyp 10/16/2011   Repeat 2018  . Arthritis     Hips, R>L & KNEES  . CAD, multiple vessel    3V CAD cath 08/08/13----CABG done shortly after.  . 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 pylori neg, no dysplasia or malignancy.  . Chronic diastolic heart failure ([place]) 2016  . Chronic renal insufficiency, stage II (mild)   . COPD (chronic obstructive pulmonary disease) ([place])    GOLD II.  Spirometry  2004 borderline obstruction; 2015 mod obst: noncompliant with bid symbicort so pulmonologist switched him to a once daily inhaler: Trelegy ellipta 12/2015.  . Diabetic nephropathy ([place])    Elevated urine microalb/cr 03/2011  . DM type 2 (diabetes mellitus, type 2) ([place])    Poor control on max oral meds--pt eventually agreed to insulin therapy.  As of 2017 his DM is being managed by Dr. Dwyane Dee in endo.  . DOE (dyspnea on exertion)    COPD + chronic diastolic HF  . Erectile dysfunction    Normal testosterone  . Hearing loss of both ears 2016   Hearing aids  . Hyperlipidemia   . Hyperplastic colon polyp 2001  . Hypertension   . Iron deficiency anemia 2014   03/2012  capsule endoscopy showed 2 AVMs--likely responsible for his IDA--lifetime iron supp recommended + q8moCBCs.  . Macular degeneration, dry    Mild, bilat (Optometrist, [place] Knifeat [place]  . Obesity   . Open toe wound 11/2015   Wound care clinic appt made and then canceled when toe improved.  . Other and unspecified angina pectoris   . P[place] (peripheral artery disease) [place] 02/2016   Abnormal ABI's and waveforms: LE arterial duplex ordered for f/u as per cardiologist's recommendation.  .Marland KitchenPAF (paroxysmal atrial fibrillation) ([place] 10/2014   xarelto  . Pericardial effusion with cardiac tamponade 6//29/15   pericardiocentesis was done,  Infectious/inflamm (cytology showed NO MALIGNANT CELLS)  . Pleural plaque    asbestos exposure x 2 yrs: Dr. MVaughan Brownerordered CT chest for further eval on 02/2016  . Pneumonia   . Tobacco dependence in remission    100+ pack-yr hx: quit after CABG    Past Surgical History:  Procedure Laterality Date  . APPENDECTOMY  1957  . CARDIAC CATHETERIZATION  08/08/2013  . CATARACT EXTRACTION W/ INTRAOCULAR LENS  IMPLANT, BILATERAL  04/08/2006 & 04/22/2006  . CATARACT EXTRACTION W/ INTRAOCULAR LENS  IMPLANT, BILATERAL Bilateral   . CHOLECYSTECTOMY OPEN  1999  . COLONOSCOPY  10/16/2011   Procedure: COLONOSCOPY;  Surgeon: RInda Castle MD;  Location: [facility];  Service: Endoscopy;  Laterality: N/A;  . CORONARY ARTERY BYPASS  GRAFT N/A 08/14/2013   Procedure: CORONARY ARTERY BYPASS GRAFTING (CABG);  Surgeon: Melrose Nakayama, MD;  Location: [facility];  Service: Open Heart Surgery;  Laterality: N/A;  Times 4   using left internal mammary artery and endoscopically harvested bilateral saphenous vein  . ESOPHAGOGASTRODUODENOSCOPY  02/25/12   Atrophic gastritis with a few erosions--capsule endoscopy planned as of 02/25/12 (Dr. Deatra Ina).  [name] HARDWARE REMOVAL Right 08/12/2012   Procedure: HARDWARE REMOVAL;  Surgeon: Mcarthur Rossetti, MD;   Location: [facility];  Service: Orthopedics;  Laterality: Right;  . HIP SURGERY Right 1954   Repair of slipped capital femoral epiphysis.  . INTRAOPERATIVE TRANSESOPHAGEAL ECHOCARDIOGRAM N/A 08/14/2013   Normal LV function. Procedure: INTRAOPERATIVE TRANSESOPHAGEAL ECHOCARDIOGRAM;  Surgeon: Melrose Nakayama, MD;  Location: [facility];  Service: Open Heart Surgery;  Laterality: N/A;  . LEFT HEART CATHETERIZATION WITH CORONARY ANGIOGRAM N/A 08/08/2013   Procedure: LEFT HEART CATHETERIZATION WITH CORONARY ANGIOGRAM;  Surgeon: Jettie Booze, MD;  Location: [facility] CATH LAB;  Service: Cardiovascular;  Laterality: N/A;  . PATELLA FRACTURE SURGERY Left ~ 1979   bolt + 3 screws to repair tib plateau fx  . PERICARDIAL TAP N/A 07/01/2013   Procedure: PERICARDIAL TAP;  Surgeon: Jettie Booze, MD;  Location: [facility] CATH LAB;  Service: Cardiovascular;  Laterality: N/A;  . TESTICLE SURGERY  as a child   Undescended testicle brought down into scrotum  . TONSILLECTOMY  1947  . TOTAL HIP ARTHROPLASTY Right 08/12/2012   Procedure: REMOVAL OF OLD PINS RIGHT HIP AND RIGHT TOTAL HIP ARTHROPLASTY ANTERIOR APPROACH;  Surgeon: Mcarthur Rossetti, MD;  Location: [facility];  Service: Orthopedics;  Laterality: Right;  . TRANSTHORACIC ECHOCARDIOGRAM  10/30/14   Mod LVH, EF 60-65%, normal wall motion, mod mitral regurg, mild PAH    Social History   Social History  . Marital status: Married    Spouse name: [name]  . Number of children: 4  . Years of education: N/A   Occupational History  . bus operator    Social History Main Topics  . Smoking status: Former Smoker    Packs/day: 0.25    Years: 62.00    Types: Cigarettes    Quit date: 10/23/2015  . Smokeless tobacco: Former Systems developer    Quit date: 06/30/2013  . Alcohol use No  . Drug use: No  . Sexual activity: No   Other Topics Concern  . Not on file   Social History Narrative   Married, 4 children.   Formerly a Medical illustrator.   Local  transportation bus driver.  Level of education: HS.     +tobacco--lifelong (still smoking as of 08/2011).  No alcohol or drugs.   No exercise.  +excessive caffeine.    Family History  Problem Relation Age of Onset  . Heart disease Father   . Heart attack Father   . CVA Mother   . Hypertension Mother   . Diabetes Paternal Grandmother   . Breast cancer Sister   . Diabetes Maternal Uncle   . Heart disease Maternal Uncle   . Stroke Neg Hx     Current Outpatient Prescriptions  Medication Sig Dispense Refill  . acetaminophen (TYLENOL) 325 MG tablet Take 2 tablets (650 mg total) by mouth every 4 (four) hours as needed for headache or mild pain.    [name] amLODipine (NORVASC) 5 MG tablet Take 1 tablet (5 mg total) by mouth daily. 30 tablet 11  . Ascorbic Acid (VITAMIN C) 1000 MG tablet Take 1,000 mg  by mouth daily.    [name] aspirin 81 MG tablet Take 81 mg by mouth at bedtime. Reported on 02/26/2015    . atorvastatin (LIPITOR) 40 MG tablet TAKE ONE TABLET BY MOUTH EVERY DAY 30 tablet 6  . budesonide-formoterol (SYMBICORT) 160-4.5 MCG/ACT inhaler Inhale 2 puffs into the lungs 2 (two) times daily. 1 Inhaler 12  . buPROPion (WELLBUTRIN SR) 150 MG 12 hr tablet TAKE ONE TABLET BY MOUTH TWICE DAILY 60 tablet 6  . Cholecalciferol (VITAMIN D-3) 1000 units CAPS Take 1,000 Units by mouth daily. Reported on 02/26/2015    . Chromium Picolinate 1000 MCG TABS Take 1,000 mcg by mouth daily. Reported on 02/26/2015    . citalopram (CELEXA) 40 MG tablet Take 1 tablet (40 mg total) by mouth daily. 90 tablet 3  . diltiazem (CARDIZEM CD) 240 MG 24 hr capsule Take 1 capsule (240 mg total) by mouth daily. 30 capsule 1  . fluticasone (FLONASE) 50 MCG/ACT nasal spray Place 2 sprays into both nostrils daily. 16 g 1  . furosemide (LASIX) 40 MG tablet Take 40 mg by mouth daily.    [name] gabapentin (NEURONTIN) 100 MG capsule TAKE 2 CAPSULES BY MOUTH 3 TIMES DAILY (Patient taking differently: take 2 capsules in the morning and 3 capsules  at bedtime) 180 capsule 6  . guaiFENesin (MUCINEX) 600 MG 12 hr tablet Take 2 tablets (1,200 mg total) by mouth 2 (two) times daily. 30 tablet 0  . Insulin Glargine (TOUJEO SOLOSTAR) 300 UNIT/ML SOPN Inject 70 Units into the skin daily. 6 pen 1  . Insulin Pen Needle 32G X 5 MM MISC Use to inject insulin TID as directed by physician. 100 each 12  . insulin regular (NOVOLIN R,HUMULIN R) 100 units/mL injection Inject 16 units SQ with BF, 17 units SQ with Lunch, and 18 U SQ with Supper (Patient taking differently: 24 units in the am and at lunch and 30 at bedtime) 1 vial 6  . isosorbide mononitrate (IMDUR) 30 MG 24 hr tablet TAKE ONE TABLET BY MOUTH DAILY 90 tablet 1  . Multiple Vitamin (MULTIVITAMIN WITH MINERALS) TABS Take 1 tablet by mouth daily.    . nitroGLYCERIN (NITROSTAT) 0.4 MG SL tablet Place 1 tablet (0.4 mg total) under the tongue every 5 (five) minutes as needed for chest pain. 25 tablet 3  . pioglitazone (ACTOS) 45 MG tablet Take 45 mg by mouth daily. Reported on 02/26/2015    . PROAIR HFA 108 (90 Base) MCG/ACT inhaler INHALE 2 PUFFS INTO THE LUNGS EVERY 4 HOURS AS NEEDED FOR WHEEZING ORSHORTNESS OF BREATH 8.5 g 1  . rivaroxaban (XARELTO) 20 MG TABS tablet Take 1 tablet (20 mg total) by mouth daily with supper. 90 tablet 1  . SYNJARDY 05-998 MG TABS TAKE ONE TABLET BY MOUTH TWICE DAILY AFTER A MEAL 60 tablet 3  . tamsulosin (FLOMAX) 0.4 MG CAPS capsule TAKE 1 CAPSULE BY MOUTH EVERY DAY AFTER SUPPER 90 capsule 1   No current facility-administered medications for this visit.     Allergies  Allergen Reactions  . Morphine And Related Other (See Comments)    Drenched with perspiration  . Demerol [Meperidine] Nausea Only  . Starlix [Nateglinide] Other (See Comments)    gassy     REVIEW OF SYSTEMS:   Cardiac:  positive for: Shortness of breath upon exertion, negative for: Chest pain or chest pressure and Shortness of breath when lying flat,   Vascular:  positive for: pain with  ambulation and rest, leg swelling negative  for: Blood clot in your veins  Pulmonary:  positive for: oxygen at home and wheezing,  negative for: Productive cough  Neurologic:  positive for: No symptoms, negative for: Sudden weakness in arms or legs, Sudden numbness in arms or legs, Sudden onset of difficulty speaking or slurred speech, Temporary loss of vision in one eye and Problems with dizziness  Gastrointestinal:  positive for: no symptoms, negative for: Blood in stool and Vomited blood  Genitourinary:  positive for: no symptoms, negative for: Burning when urinating and Blood in urine  Psychiatric:  positive for: no symptoms,  negative for: Major depression  Hematologic:  positive for: no symptoms,  negative for: negative for: Bleeding problems and Problems with blood clotting too easily  Dermatologic:  positive for: Rashes or ulcers, negative for: no skin cancers  Constitutional:  positive for: no symptoms, negative for: Fever or chills  Ear/Nose/Throat:  positive for: no symptoms, negative for: Change in hearing, Nose bleeds and Sore throat  Musculoskeletal:  positive for: no symptoms, negative for: Back pain, Joint pain and Muscle pain    For VQI Use Only  PRE-ADM LIVING: Home  AMB STATUS: Ambulatory  CAD Sx: History of MI, but no symptoms No MI within 6 months  PRIOR CHF: None  STRESS TEST: No   Physical Examination  Vitals:   04/21/16 0932  BP: 130/69  Pulse: 90  Resp: 20  Temp: 98.4 F (36.9 C)  TempSrc: Oral  SpO2: 92%  Weight: 252 lb (114.3 kg)  Height: _0  (1.803 m)    Body mass index is 35.15 kg/m.  General: Alert, O x 3, WD, NAD  Head: [place]/AT,    Ear/Nose/Throat: Hearing grossly intact, nares without erythema or drainage, oropharynx without Erythema or Exudate, Mallampati score: 3, Dentition intact  Eyes: PERRLA, EOMI,    Neck: Supple, mid-line trachea,    Pulmonary: Sym exp, good B air movt, rales on  BLL  Cardiac: RRR, Nl S1, S2, no Murmurs, No rubs, No S3,S4  Vascular: Vessel Right Left  Radial Palpable Palpable  Brachial Palpable Palpable  Carotid Palpable Palpable  Aorta Not palpable due to pannus N/A  Femoral Palpable Palpable  Popliteal Not palpable Not palpable  PT Palpable Palpable  DP Palpable Palpable   Gastrointestinal: soft, non-distended, non-tender to palpation, No guarding or rebound, no HSM, no masses, no CVAT B, No palpable prominent aortic pulse,    Musculoskeletal: M/S 5/5 throughout  , Extremities without ischemic changes  , No edema present, Varicosities present: faint B , mild R>L ankle LDS, mild cyanosis in both feet (resolve with elevation)  Neurologic: Cranial nerves 2-12 intact , Pain and light touch intact in extremities , Motor exam as listed above  Psychiatric: Judgement intact, Mood & affect appropriate for pt's clinical situation  Dermatologic: See M/S exam for extremity exam, No rashes otherwise noted  Lymph : Palpable lymph nodes: None   Non-Invasive Vascular Imaging  Outside BLE Physiologic Studies (Date: 02/26/16)  R:   ABI: 0.90,   DP: tri  PT: mono  TBI: 0.93  L:   ABI: 1.08,   DP: tri  PT: tri  TBI: 0.68   Outside Studies/Documentation 2 pages of outside documents were reviewed including: outside PCP chart.   Medical Decision Making  Joshua [name] is a 76 y.o. male who presents with: asx mild BLE PAD, DM neuropathy, COPD, chronic venous insufficiency (C4)   At this point, this patient's PAD is minimal.  The cyanosis seen in both feet is  due to his CVI rather than significant PAD.  Based on this patient's history and physical exam, I recommend: maximal medical management.  I discussed in depth with the patient the nature of atherosclerosis, and emphasized the importance of maximal medical management including strict control of blood pressure, blood glucose, and lipid levels, antiplatelet agent, obtaining regular  exercise, and cessation of smoking.    The patient is aware that without maximal medical management the underlying atherosclerotic disease process will progress, limiting the benefit of any interventions. The patient is currently on a statin: Lipitor.  The patient is currently on an anti-platelet: ASA.  Thank you for allowing [place] to participate in this patient's care.   Adele Barthel, MD, FACS [facility] Office: [phone] Pager: [phone]  04/21/2016, 10:10 AM

## 2016-04-22 ENCOUNTER — Other Ambulatory Visit: Payer: Self-pay | Admitting: Family Medicine

## 2016-04-22 ENCOUNTER — Encounter: Payer: Medicare Other | Admitting: Nutrition

## 2016-04-22 ENCOUNTER — Other Ambulatory Visit: Payer: Self-pay

## 2016-04-22 MED ORDER — INSULIN PEN NEEDLE 32G X 5 MM MISC: 5 refills | Status: DC

## 2016-04-23 ENCOUNTER — Encounter: Payer: Self-pay | Admitting: Family Medicine

## 2016-04-25 DIAGNOSIS — J449 Chronic obstructive pulmonary disease, unspecified: Secondary | ICD-10-CM | POA: Diagnosis not present

## 2016-04-29 ENCOUNTER — Other Ambulatory Visit: Payer: Self-pay

## 2016-05-05 ENCOUNTER — Other Ambulatory Visit: Payer: Self-pay | Admitting: Family Medicine

## 2016-05-06 ENCOUNTER — Other Ambulatory Visit: Payer: Self-pay | Admitting: Family Medicine

## 2016-05-06 ENCOUNTER — Ambulatory Visit (INDEPENDENT_AMBULATORY_CARE_PROVIDER_SITE_OTHER): Payer: Medicare Other | Admitting: Endocrinology

## 2016-05-06 ENCOUNTER — Encounter: Payer: Self-pay | Admitting: Endocrinology

## 2016-05-06 VITALS — BP 136/72 | HR 84 | Ht 71.0 in | Wt 253.0 lb

## 2016-05-06 DIAGNOSIS — E1165 Type 2 diabetes mellitus with hyperglycemia: Secondary | ICD-10-CM

## 2016-05-06 DIAGNOSIS — Z794 Long term (current) use of insulin: Secondary | ICD-10-CM | POA: Diagnosis not present

## 2016-05-06 LAB — POCT GLYCOSYLATED HEMOGLOBIN (HGB A1C): HEMOGLOBIN A1C: 7.5

## 2016-05-06 NOTE — Telephone Encounter (Signed)
[place].  RF request for tamsulosin LOV: 02/19/16 Next ov: 05/18/16 Last written: 07/22/15 #90 w/ 8NI

## 2016-05-06 NOTE — Patient Instructions (Addendum)
Toujeo 60 units and if am sugars go >150 consistently go up  To 64  Call when out of Regular will need  Novolog

## 2016-05-06 NOTE — Progress Notes (Signed)
Patient ID: Joshua [name], male   DOB: 1940/12/29, 76 y.o.   MRN: [id number]            Reason for Appointment:  Follow-up for Type 2 Diabetes  Referring physician: McG[place]   History of Present Illness:          Date of diagnosis of type 2 diabetes mellitus: ?  2002        Background history:   He apparently had been treated with metformin initially which was continued until about 2016. He previously has also been tried on Amaryl, Actos, Farxiga and Victoza but he claims that these did not help his blood sugar and were stopped Appears to have been on insulin since about 2014 but he does not remember details. He has been taking mostly Levemir insulin as a basal insulin but was also on Lantus in 2015 for some time His best A1c has been 7.3, once in 2014 and another time in 2016, otherwise his A1c has been higher and as high as 10.8  Recent history:   INSULIN regimen is:   Toujeo 70 units in a.m., Novolin R 28 units before meals tid   Non-insulin hypoglycemic drugs the patient is taking are: Actos 45 mg daily, Synjardy 05/998 twice a day  His last A1c was 10.4 and is now 7.5  Current management, blood sugar patterns and problems identified:  His blood sugars were analyzed using the continuous glucose monitor in early April and showed the following: Hyperglycemic episodes  occurring in the early morning and also after about 1 PM with blood sugars as high as 270 at 9 AM  His insulin was changed to 28 units of regular insulin before each meal for better postprandial control  Also was instructed by nurse educator couple of times about the timing of taking his regular insulin before meals rather than after  Also changed from Levemir to [facility], now taking 70 units in the morning   More recently his blood sugars appear to be getting lower especially overnight and he is apparently checking his blood sugars more during the night  POSTPRANDIAL readings are quite variable still and still  has relatively high readings over 200 after meals at various times  He may be reducing his insulin or skipping it for meals if his blood sugar is low normal before eating  However with taking his REGULAR insulin more consistently before supper is not having his high readings after supper as before  FASTING readings are somewhat variable but fairly close to normal       Side effects from medications have been: None  Compliance with the medical regimen: Fair Hypoglycemia: Never    Glucose monitoring:  done  times a day         Glucometer:  Accu-Chek .      Blood Glucose readings with averages from download as below   PRE-MEAL Fasting Lunch Dinner Overnight  Overall  Glucose range: 68-160   89-266   62-233   44-2 08    Mean/median: 108  157  153  90  143+/-23    POST-MEAL PC Breakfast PC Lunch PC Dinner  Glucose range:    110-257   Mean/median:   170    Self-care: The diet that the patient has been following is: tries to limit Sweet drinks .     Meal times are:  Breakfast is at 7 a.m.  Dinner: 5-6 PM   Typical meal intake: Breakfast is usually eggs,  Sausage,  oatmeal or grits.  Lunch is a sandwich, any evening he has meat and vegetables, For snacks he will have fruit or crackers                Dietician visit, most recent: 01/09/16 CDE visit: 12/18               Exercise: Unable to do any     Weight history:  Wt Readings from Last 3 Encounters:  05/06/16 253 lb (114.8 kg)  04/21/16 252 lb (114.3 kg)  04/07/16 253 lb (114.8 kg)    Glycemic control:   Lab Results  Component Value Date   HGBA1C 7.5 05/06/2016   HGBA1C 10.4 (H) 11/21/2015   HGBA1C 9.2 (H) 08/21/2015   Lab Results  Component Value Date   MICROALBUR 4.6 (H) 12/17/2014   LDLCALC 44 02/19/2016   CREATININE 1.00 02/19/2016   Lab Results  Component Value Date   MICRALBCREAT 6.4 12/17/2014    Other active problems: See review of systems   Allergies as of 05/06/2016      Reactions   Morphine And  Related Other (See Comments)   Drenched with perspiration   Demerol [meperidine] Nausea Only   Starlix [nateglinide] Other (See Comments)   gassy      Medication List       Accurate as of 05/06/16  8:48 PM. Always use your most recent med list.          acetaminophen 325 MG tablet Commonly known as:  TYLENOL Take 2 tablets (650 mg total) by mouth every 4 (four) hours as needed for headache or mild pain.   amLODipine 5 MG tablet Commonly known as:  NORVASC Take 1 tablet (5 mg total) by mouth daily.   aspirin 81 MG tablet Take 81 mg by mouth at bedtime. Reported on 02/26/2015   atorvastatin 40 MG tablet Commonly known as:  LIPITOR TAKE ONE TABLET BY MOUTH EVERY DAY   budesonide-formoterol 160-4.5 MCG/ACT inhaler Commonly known as:  SYMBICORT Inhale 2 puffs into the lungs 2 (two) times daily.   buPROPion 150 MG 12 hr tablet Commonly known as:  WELLBUTRIN SR TAKE ONE TABLET BY MOUTH TWICE DAILY   Chromium Picolinate 1000 MCG Tabs Take 1,000 mcg by mouth daily. Reported on 02/26/2015   citalopram 40 MG tablet Commonly known as:  CELEXA Take 1 tablet (40 mg total) by mouth daily.   diltiazem 240 MG 24 hr capsule Commonly known as:  CARDIZEM CD Take 1 capsule (240 mg total) by mouth daily.   fluticasone 50 MCG/ACT nasal spray Commonly known as:  FLONASE Place 2 sprays into both nostrils daily.   furosemide 40 MG tablet Commonly known as:  LASIX Take 40 mg by mouth daily.   gabapentin 100 MG capsule Commonly known as:  NEURONTIN TAKE 2 CAPSULES BY MOUTH 3 TIMES DAILY   guaiFENesin 600 MG 12 hr tablet Commonly known as:  MUCINEX Take 2 tablets (1,200 mg total) by mouth 2 (two) times daily.   Insulin Glargine 300 UNIT/ML Sopn Commonly known as:  TOUJEO SOLOSTAR Inject 70 Units into the skin daily.   Insulin Pen Needle 32G X 5 MM Misc Use to inject insulin TID as directed by physician.   insulin regular 250 units/2.61m (100 units/mL) injection Commonly known  as:  NOVOLIN R,HUMULIN R Inject 16 units SQ with BF, 17 units SQ with Lunch, and 18 U SQ with Supper   isosorbide mononitrate 30 MG 24 hr tablet Commonly known as:  IMDUR TAKE ONE TABLET BY MOUTH DAILY   multivitamin with minerals Tabs tablet Take 1 tablet by mouth daily.   nitroGLYCERIN 0.4 MG SL tablet Commonly known as:  NITROSTAT Place 1 tablet (0.4 mg total) under the tongue every 5 (five) minutes as needed for chest pain.   pioglitazone 45 MG tablet Commonly known as:  ACTOS Take 45 mg by mouth daily. Reported on 02/26/2015   PROAIR HFA 108 (90 Base) MCG/ACT inhaler Generic drug:  albuterol INHALE 2 PUFFS INTO THE LUNGS EVERY 4 HOURS AS NEEDED FOR WHEEZING ORSHORTNESS OF BREATH   rivaroxaban 20 MG Tabs tablet Commonly known as:  XARELTO Take 1 tablet (20 mg total) by mouth daily with supper.   SYNJARDY 05-998 MG Tabs Generic drug:  Empagliflozin-Metformin HCl TAKE ONE TABLET BY MOUTH TWICE DAILY AFTER A MEAL   tamsulosin 0.4 MG Caps capsule Commonly known as:  FLOMAX TAKE 1 CAPSULE BY MOUTH EVERY DAY AFTER SUPPER   vitamin C 1000 MG tablet Take 1,000 mg by mouth daily.   Vitamin D-3 1000 units Caps Take 1,000 Units by mouth daily. Reported on 02/26/2015       Allergies:  Allergies  Allergen Reactions  . Morphine And Related Other (See Comments)    Drenched with perspiration  . Demerol [Meperidine] Nausea Only  . Starlix [Nateglinide] Other (See Comments)    gassy    Past Medical History:  Diagnosis Date  . Adenomatous colon polyp 10/16/2011   Repeat 2018  . Arthritis     Hips, R>L & KNEES  . CAD, multiple vessel    3V CAD cath 08/08/13----CABG done shortly after.  . 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 pylori neg, no dysplasia or malignancy.  . Chronic diastolic heart failure ([place]) 2016  . Chronic renal insufficiency, stage II (mild)   . COPD (chronic obstructive pulmonary disease) ([place])    GOLD II.  Spirometry   2004 borderline obstruction; 2015 mod obst: noncompliant with bid symbicort so pulmonologist switched him to a once daily inhaler: Trelegy ellipta 12/2015.  . Diabetic nephropathy ([place])    Elevated urine microalb/cr 03/2011  . DM type 2 (diabetes mellitus, type 2) ([place])    Poor control on max oral meds--pt eventually agreed to insulin therapy.  As of 2017 his DM is being managed by Dr. Dwyane Dee in endo.  . DOE (dyspnea on exertion)    COPD + chronic diastolic HF  . Erectile dysfunction    Normal testosterone  . Hearing loss of both ears 2016   Hearing aids  . Hyperlipidemia   . Hyperplastic colon polyp 2001  . Hypertension   . Iron deficiency anemia 2014   03/2012 capsule endoscopy showed 2 AVMs--likely responsible for his IDA--lifetime iron supp recommended + q70moCBCs.  . Macular degeneration, dry    Mild, bilat (Optometrist, [place]  . Obesity   . Open toe wound 11/2015   Wound care clinic appt made and then canceled when toe improved.  . Other and unspecified angina pectoris   . PAD (peripheral artery disease) [place] 02/2016   Abnormal ABI's and waveforms: LE arterial duplex ordered for f/u as per cardiologist's recommendation.  Vasc eval by Dr. CBridgett Larsson impression was minimal PAD, recommended maximize med mgmt.  .Marland KitchenPAF (paroxysmal atrial fibrillation) ([place] 10/2014   xarelto  . Pericardial effusion with cardiac tamponade 6//29/15   pericardiocentesis was done,  Infectious/inflamm (cytology showed NO MALIGNANT CELLS)  .  Pleural plaque    asbestos exposure x 2 yrs: Dr. Vaughan Browner ordered CT chest for further eval on 02/2016  . Pneumonia   . Tobacco dependence in remission    100+ pack-yr hx: quit after CABG    Past Surgical History:  Procedure Laterality Date  . APPENDECTOMY  1957  . CARDIAC CATHETERIZATION  08/08/2013  . CATARACT EXTRACTION W/ INTRAOCULAR LENS  IMPLANT, BILATERAL  04/08/2006 & 04/22/2006  . CATARACT EXTRACTION W/ INTRAOCULAR  LENS  IMPLANT, BILATERAL Bilateral   . CHOLECYSTECTOMY OPEN  1999  . COLONOSCOPY  10/16/2011   Procedure: COLONOSCOPY;  Surgeon: Inda Castle, MD;  Location: [facility];  Service: Endoscopy;  Laterality: N/A;  . CORONARY ARTERY BYPASS GRAFT N/A 08/14/2013   Procedure: CORONARY ARTERY BYPASS GRAFTING (CABG);  Surgeon: Melrose Nakayama, MD;  Location: [place];  Service: Open Heart Surgery;  Laterality: N/A;  Times 4   using left internal mammary artery and endoscopically harvested bilateral saphenous vein  . ESOPHAGOGASTRODUODENOSCOPY  02/25/12   Atrophic gastritis with a few erosions--capsule endoscopy planned as of 02/25/12 (Dr. Deatra Ina).  [name] HARDWARE REMOVAL Right 08/12/2012   Procedure: HARDWARE REMOVAL;  Surgeon: Mcarthur Rossetti, MD;  Location: [facility];  Service: Orthopedics;  Laterality: Right;  . HIP SURGERY Right 1954   Repair of slipped capital femoral epiphysis.  . INTRAOPERATIVE TRANSESOPHAGEAL ECHOCARDIOGRAM N/A 08/14/2013   Normal LV function. Procedure: INTRAOPERATIVE TRANSESOPHAGEAL ECHOCARDIOGRAM;  Surgeon: Melrose Nakayama, MD;  Location: [place];  Service: Open Heart Surgery;  Laterality: N/A;  . LEFT HEART CATHETERIZATION WITH CORONARY ANGIOGRAM N/A 08/08/2013   Procedure: LEFT HEART CATHETERIZATION WITH CORONARY ANGIOGRAM;  Surgeon: Jettie Booze, MD;  Location[facility] CATH LAB;  Service: Cardiovascular;  Laterality: N/A;  . PATELLA FRACTURE SURGERY Left ~ 1979   bolt + 3 screws to repair tib plateau fx  . PERICARDIAL TAP N/A 07/01/2013   Procedure: PERICARDIAL TAP;  Surgeon: Jettie Booze, MD;  Location: [facility] C[facility] LAB;  Service: Cardiovascular;  Laterality: N/A;  . TESTICLE SURGERY  as a child   Undescended testicle brought down into scrotum  . TONSILLECTOMY  1947  . TOTAL HIP ARTHROPLASTY Right 08/12/2012   Procedure: REMOVAL OF OLD PINS RIGHT HIP AND RIGHT TOTAL HIP ARTHROPLASTY ANTERIOR APPROACH;  Surgeon: Mcarthur Rossetti, MD;  Location: [facility];  Service:  Orthopedics;  Laterality: Right;  . TRANSTHORACIC ECHOCARDIOGRAM  10/30/14   Mod LVH, EF 60-65%, normal wall motion, mod mitral regurg, mild PAH    Family History  Problem Relation Age of Onset  . Heart disease Father   . Heart attack Father   . CVA Mother   . Hypertension Mother   . Diabetes Paternal Grandmother   . Breast cancer Sister   . Diabetes Maternal Uncle   . Heart disease Maternal Uncle   . Stroke Neg Hx     Social History:  reports that he quit smoking about 6 months ago. His smoking use included Cigarettes. He has a 15.50 pack-year smoking history. He quit smokeless tobacco use about 2 years ago. He reports that he does not drink alcohol or use drugs.   Review of Systems   Lipid history: He had been prescribed Lipitor 40 mg LDL is better on the last measurement:    Lab Results  Component Value Date   CHOL 119 02/19/2016   HDL 43.30 02/19/2016   LDLCALC 44 02/19/2016   LDLDIRECT 187.0 11/21/2015   TRIG 159.0 (H) 02/19/2016   CHOLHDL 3  02/19/2016           Hypertension:Currently treated with amlodipine 5 mg, diltiazem 240 mg, not on ACE inhibitor or ARB    BP Readings from Last 3 Encounters:  05/06/16 136/72  04/21/16 130/69  04/07/16 132/60     Most recent eye exam was 7/17, Dr Marina Gravel? Has had no retinopathy  Most recent foot exam: 12/17  History of depression on 2 drugs, recently controlled  LABS:  Office Visit on 05/06/2016  Component Date Value Ref Range Status  . Hemoglobin A1C 05/06/2016 7.5   Final    Physical Examination:  BP 136/72   Pulse 84   Ht 5' 11" (1.803 m)   Wt 253 lb (114.8 kg)   SpO2 94%   BMI 35.29 kg/m       ASSESSMENT:  Diabetes type 2, uncontrolled    See history of present illness for detailed discussion of current diabetes management, blood sugar patterns and problems identified  With intensifying his insulin has blood sugars are progressively improving  His A1c now 7.5 which is much better than  usual  Also he is finally taking his mealtime insulin before eating rather than after Also not checking blood sugars more consistently at various times Blood sugar patterns were better identified with using the continuous glucose monitoring a month ago  Recently appears to be needing less basal insulin since overnight blood sugars are lower with Toujeo compared to Levemir being taken in the morning only Has a tendency to overnight hypoglycemia which may be partly related to his taking regular insulin at suppertime   HYPERTENSION: Blood pressure is controlled  Renal function: Stable, continues to be on Jardiance/metformin  PLAN:     He will change Regular Insulin to NOVOLOG which will provide better postprandial control and less late hypoglycemia  Able to also take this with him when eating out instead of the syringe  He may be able to take it just before eating also more conveniently  Meanwhile reduce Toujeo down to 60 units in the morning  He will let us know if blood sugars are not consistently controlled but if morning readings are over 150 he can go back up 4 units on the Toujeo  Since his postprandial readings are not consistently high or low he will continue the same dose of Regular Insulin before meals for the moment    Patient Instructions  Toujeo 60 units and if am sugars go >150 consistently go up  To 64  Call when out of Regular will need  Novolog   Counseling time on subjects discussed above is over 50% of today's 25 minute visit   Katena Petitjean 05/06/2016, 8:48 PM   Note: This office note was prepared with Dragon voice recognition system technology. Any transcriptional errors that result from this process are unintentional.

## 2016-05-11 ENCOUNTER — Other Ambulatory Visit: Payer: Self-pay | Admitting: *Deleted

## 2016-05-11 NOTE — Patient Outreach (Signed)
Wife called me on Friday to report pt was very SOB and wheezing. He had been outside for 2 days doing yard work also. His current furosemide dose was 40 mg.  I asked that he increase his furosemide to 80 mg bid for the weekend and I would see him on Monday.  Today, pt is feeling better. He is not SOB today. His weight is down 7#. He only took 40 mg of furosemide today. He did report feeling pretty weak after the heavy diuresis.  He has not been recording his weights. He has not been wearing his O2 regularly but he has used it over the weekend.  O:  BP (!) 126/58 (BP Location: Right Arm, Patient Position: Sitting, Cuff Size: Normal)   Pulse 68   Resp 18   Wt 248 lb (112.5 kg) Comment: Weight on Friday was 255.  SpO2 96% Comment: room air  BMI 34.59 kg/m   RRR with murmur.  Lungs dimished and basilar crackles.  Extremities - 1+ edema  A:  CHF/COPD exacerbation  P:  Request that pt take furosemide 40 mg bid if he feels like he can tolerate the second dose until he sees Dr. Anitra Lauth next Monday.       Call me with any questions or problems.      Requested pt make sure he is taking his symbicort bid. Avoid going outside when air quality warning are orange or red.      Avoid extreme heat. Use air conditioner instead of opening the house windows.       Discussed restricting fluids to 64 oz a day. (He says he drinks a lot more).  Dr. Anitra Lauth consider prescribing him a nebulizer and treatment for prn use.  Deloria Lair [facility] [place] [phone]

## 2016-05-11 NOTE — Progress Notes (Signed)
Noted.

## 2016-05-13 ENCOUNTER — Other Ambulatory Visit: Payer: Self-pay | Admitting: Family Medicine

## 2016-05-15 ENCOUNTER — Other Ambulatory Visit: Payer: Self-pay | Admitting: *Deleted

## 2016-05-15 IMAGING — CT CT CHEST W/ CM
2 of 5 series · 13 of 36 positions shown, 16 images · IV contrast (Omni 300)
Comparison: None.

CLINICAL DATA: Bloody pericardial effusion.  Assess for malignancy.

EXAM:
CT CHEST, ABDOMEN, AND PELVIS WITH CONTRAST
TECHNIQUE: Multidetector CT imaging of the chest, abdomen and pelvis was
performed following the standard protocol during bolus
administration of intravenous contrast.
CONTRAST:  100mL OMNIPAQUE IOHEXOL 300 MG/ML  SOLN

[Series 2: cap 5.0 i31f 1 · axial · 0.92mm/px · z∈[+826,+1386]mm · 10 of 130 slices shown, 13 images]
[im 9/130  mediastinal]
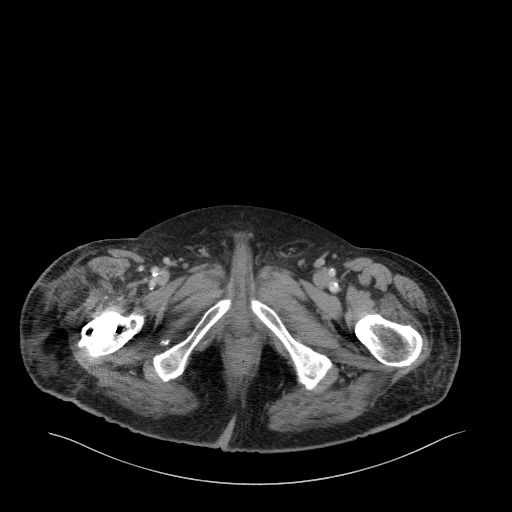
[im 9/130  lung]
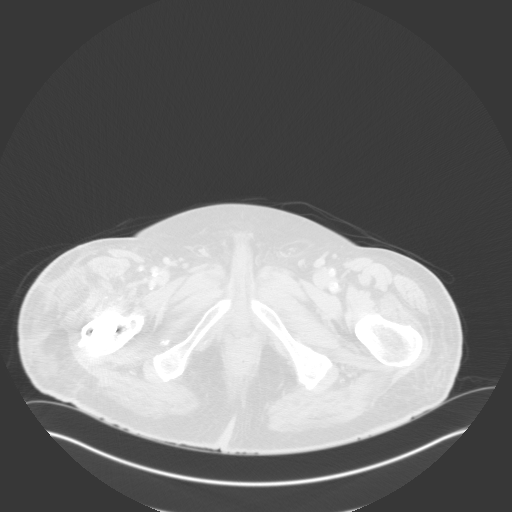
[im 26/130  lung]
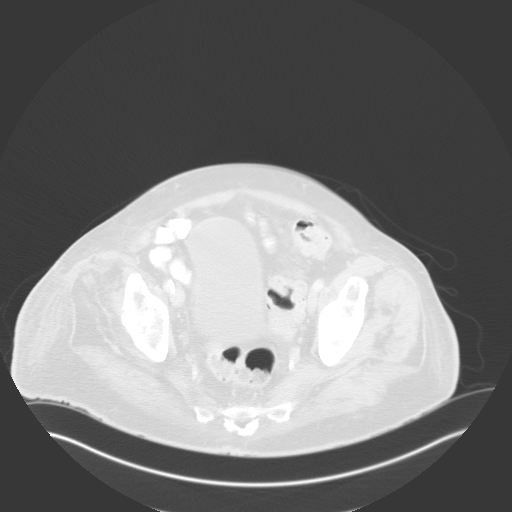
[im 35/130  lung]
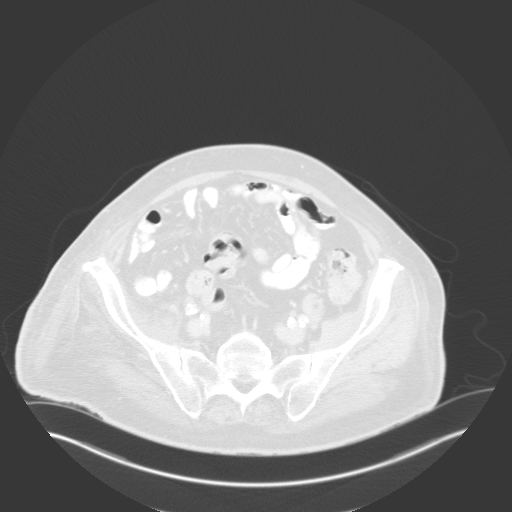
[im 44/130  lung]
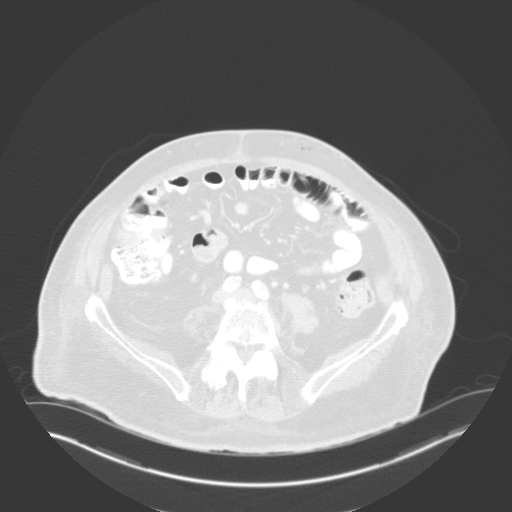
[im 61/130  mediastinal]
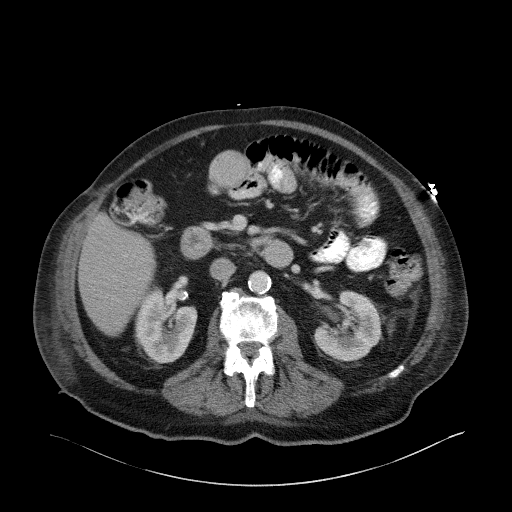
[im 61/130  lung]
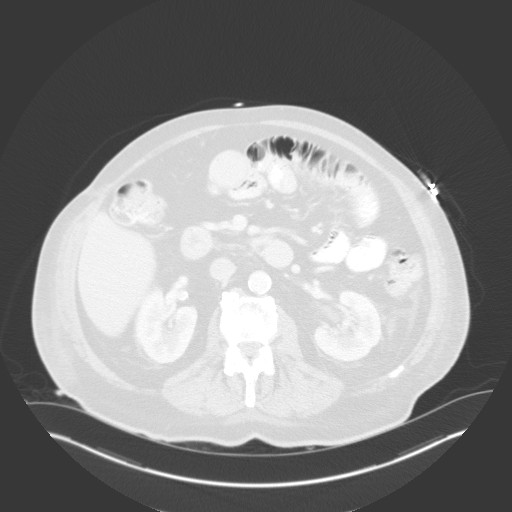
[im 69/130  lung]
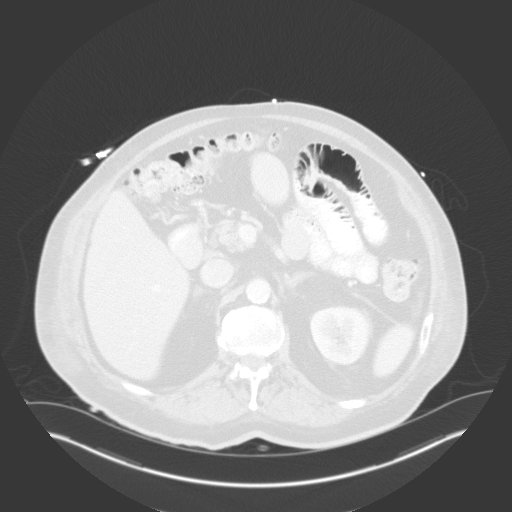
[im 87/130  lung]
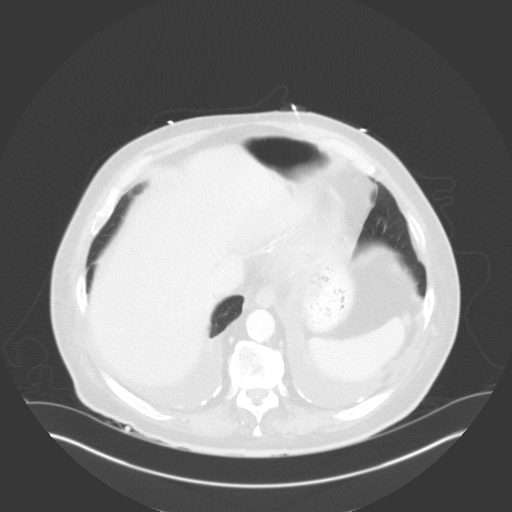
[im 95/130  lung]
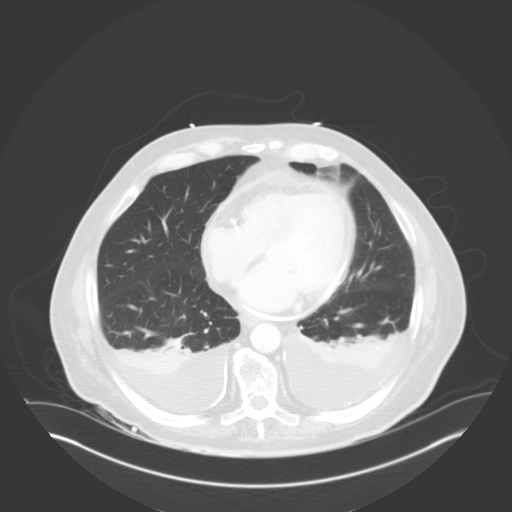
[im 104/130  mediastinal]
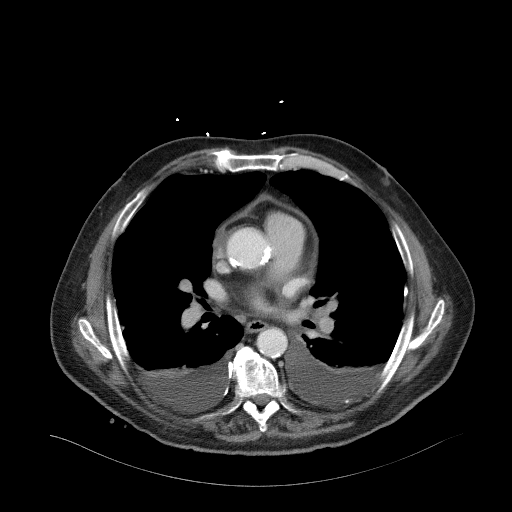
[im 104/130  lung]
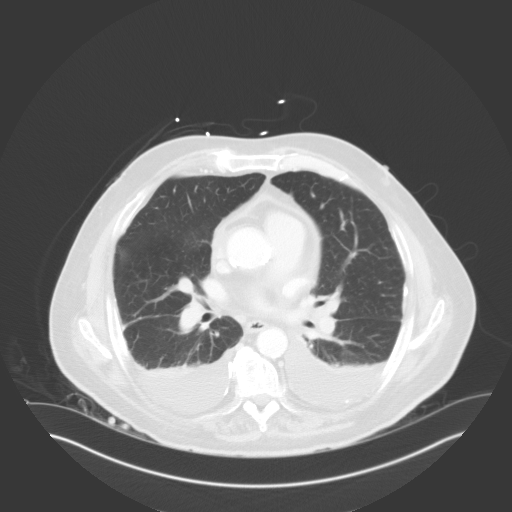
[im 121/130  lung]
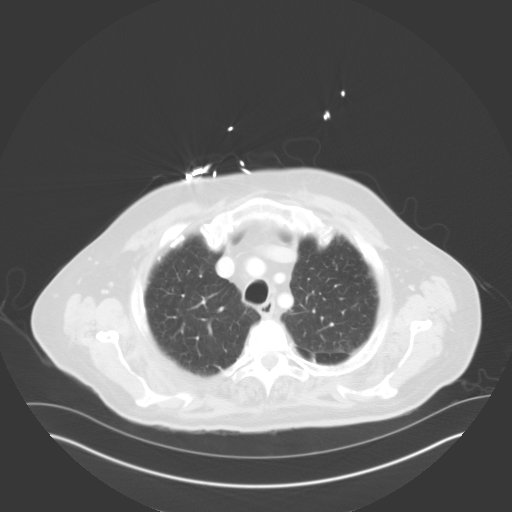

[Series 5: coronal · coronal · 0.82mm/px · 3 of 107 slices shown]
[im 22/107  lung]
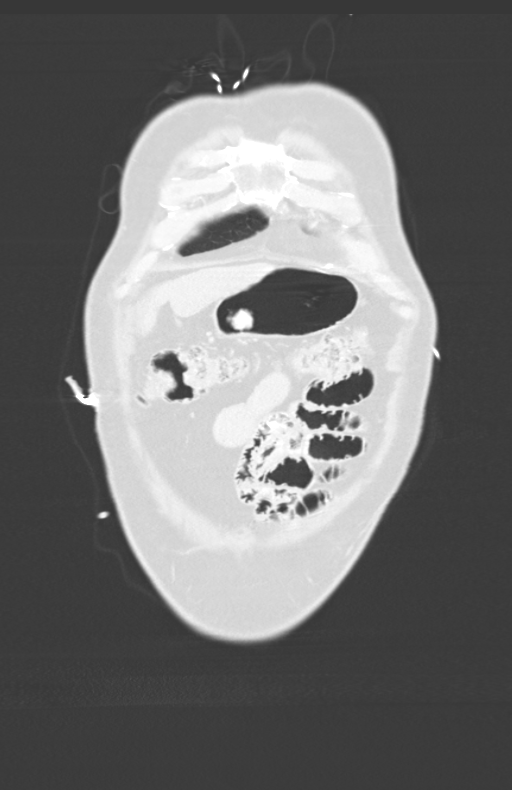
[im 43/107  lung]
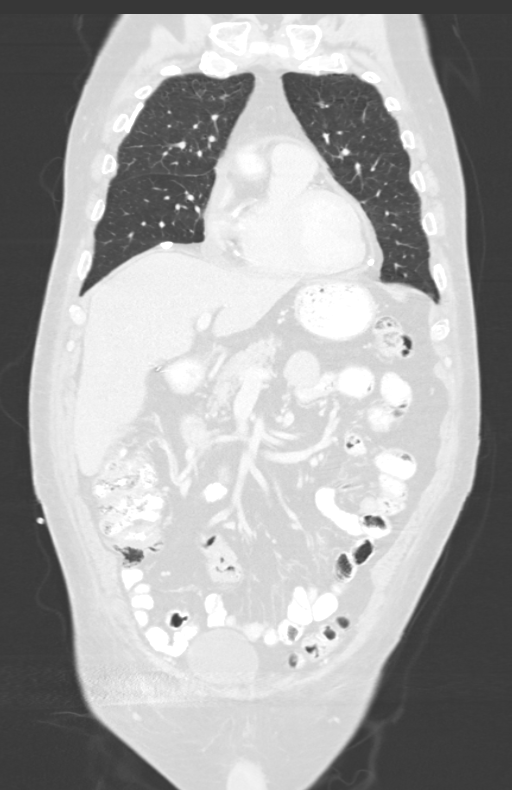
[im 64/107  lung]
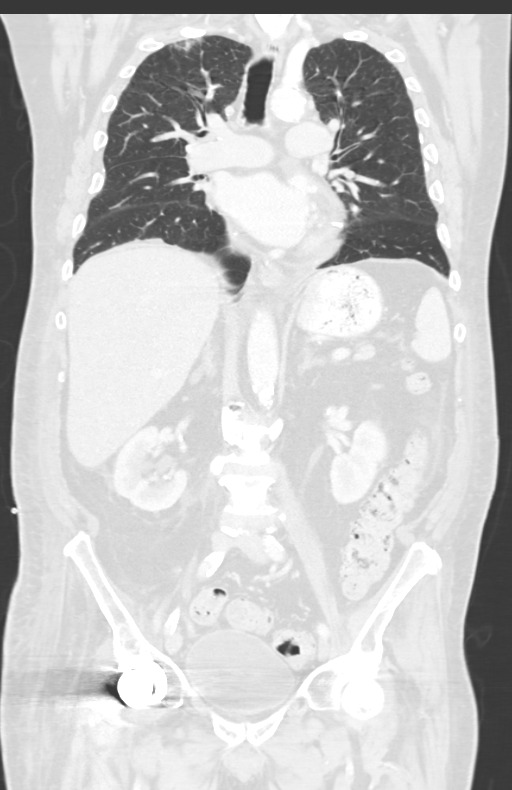

[13 of 36 positions shown; findings below may reference images not displayed]

FINDINGS: CT CHEST FINDINGS

THORACIC INLET/BODY WALL:

No adenopathy or acute abnormality.

MEDIASTINUM:

No cardiomegaly. There is a subxiphoid pericardial drain which wraps
around the posterior heart. Minimal pericardial fluid remains. There
is enhancing and thickened pericardium, without focal nodularity.
Diffuse coronary artery atherosclerosis. Dense aortic annular
calcification. No acute aortic findings. There is scattered punctate
calcification involving the pulmonary arteries. No evidence of acute
pulmonary embolus or definitive pulmonary hypertension. No
lymphadenopathy.

LUNG WINDOWS:

There are small, layering bilateral pleural effusions. Bilateral
calcified pleural plaques with a Deep pendant and basilar
predominance. No concerning pleural nodularity. There is dependent
atelectasis. Subpleural ground-glass density at the right apex
measuring up to 2.4 cm. No evidence of suspicious, solid, nodule.

OSSEOUS:

No acute fracture.  No suspicious lytic or blastic lesions.

CT ABDOMEN AND PELVIS FINDINGS

BODY WALL: Unremarkable.

Liver: No focal abnormality.

Biliary: Cholecystectomy.

Pancreas: Unremarkable.

Spleen: Unremarkable.

Adrenals: Unremarkable.

Kidneys and ureters: No hydronephrosis or stone. Small, incidental
pelvic cysts.

Bladder: Unremarkable.

Reproductive: Unremarkable.

Bowel: No obstruction. No pericecal inflammation. Small area of
high-density medial to the second portion duodenum is likely
contrast within a small diverticulum.

Retroperitoneum: No mass or adenopathy.

Peritoneum: No ascites or pneumoperitoneum.

Vascular: No acute abnormality.

OSSEOUS: Total right hip arthroplasty, with right gluteal and psoas
atrophy. The imaged portions show no hardware failure. Diffuse
degenerative disc disease. There are flowing osteophytes in the mid
thoracic spine resulting in multi level ankylosis. Advanced lower
lumbar facet osteoarthritis.
IMPRESSION: 1. No evidence of malignancy in the chest or abdomen.
2. Pericardial thickening and trace remaining effusion. No discrete
pericardial nodule.
3. Asbestos related pleural disease with small bilateral pleural
effusions. Asbestos related pericardial disease has been described.
4. Bilateral pulmonary artery calcifications, suspect remote
pulmonary embolism.
5. 2.5 cm ground-glass nodule at the right apex is likely infectious
or inflammatory. Recommend three-month follow-up chest CT to
document clearance.

## 2016-05-15 NOTE — Patient Outreach (Signed)
Follow up telephone call to acute visit for CHF exacerbation of previously followed patient.  Mr. Budhu states he is doing well. His weight has come down to 248 and he has an appointment with his MD on Monday. He denies SOB and edema. He says he is eating right, taking his meds, including his controlling inhaler, checking his glucose levels. I have encouraged him to continue his good self monitoring and have advised him to call Mr Anitra Lauth if any future problems arise.  Deloria Lair [facility] [place] [phone]

## 2016-05-18 ENCOUNTER — Encounter: Payer: Self-pay | Admitting: Family Medicine

## 2016-05-18 ENCOUNTER — Ambulatory Visit (HOSPITAL_BASED_OUTPATIENT_CLINIC_OR_DEPARTMENT_OTHER)
Admission: RE | Admit: 2016-05-18 | Discharge: 2016-05-18 | Disposition: A | Discharge status: Home / Self Care | Payer: Medicare Other | Source: Ambulatory Visit | Attending: Family Medicine | Admitting: Family Medicine

## 2016-05-18 ENCOUNTER — Ambulatory Visit (INDEPENDENT_AMBULATORY_CARE_PROVIDER_SITE_OTHER): Payer: Medicare Other | Admitting: Family Medicine

## 2016-05-18 VITALS — BP 124/65 | HR 80 | Temp 98.0°F | Resp 16 | Ht 71.0 in | Wt 252.0 lb

## 2016-05-18 DIAGNOSIS — J929 Pleural plaque without asbestos: Secondary | ICD-10-CM | POA: Insufficient documentation

## 2016-05-18 DIAGNOSIS — I10 Essential (primary) hypertension: Secondary | ICD-10-CM | POA: Diagnosis not present

## 2016-05-18 DIAGNOSIS — I5032 Chronic diastolic (congestive) heart failure: Secondary | ICD-10-CM

## 2016-05-18 DIAGNOSIS — N182 Chronic kidney disease, stage 2 (mild): Secondary | ICD-10-CM | POA: Diagnosis not present

## 2016-05-18 DIAGNOSIS — D500 Iron deficiency anemia secondary to blood loss (chronic): Secondary | ICD-10-CM

## 2016-05-18 DIAGNOSIS — Z0000 Encounter for general adult medical examination without abnormal findings: Secondary | ICD-10-CM

## 2016-05-18 LAB — FERRITIN: FERRITIN: 18.1 ng/mL — AB (ref 22.0–322.0)

## 2016-05-18 LAB — BASIC METABOLIC PANEL
BUN: 23 mg/dL (ref 6–23)
CHLORIDE: 106 meq/L (ref 96–112)
CO2: 26 meq/L (ref 19–32)
Calcium: 9.2 mg/dL (ref 8.4–10.5)
Creatinine, Ser: 0.94 mg/dL (ref 0.40–1.50)
GFR: 82.69 mL/min (ref >60.00)
Glucose, Bld: 132 mg/dL — ABNORMAL HIGH (ref 70–99)
POTASSIUM: 4.5 meq/L (ref 3.5–5.1)
Sodium: 142 mEq/L (ref 135–145)

## 2016-05-18 LAB — CBC WITH DIFFERENTIAL/PLATELET
BASOS ABS: 0.1 10*3/uL (ref 0.0–0.1)
Basophils Relative: 0.8 % (ref 0.0–3.0)
EOS ABS: 0.2 10*3/uL (ref 0.0–0.7)
Eosinophils Relative: 2.1 % (ref 0.0–5.0)
HEMATOCRIT: 44.7 % (ref 39.0–52.0)
Hemoglobin: 14.2 g/dL (ref 13.0–17.0)
LYMPHS ABS: 1.4 10*3/uL (ref 0.7–4.0)
LYMPHS PCT: 15.9 % (ref 12.0–46.0)
MCHC: 31.8 g/dL (ref 30.0–36.0)
MCV: 81.1 fl (ref 78.0–100.0)
Monocytes Absolute: 0.7 10*3/uL (ref 0.1–1.0)
Monocytes Relative: 8.5 % (ref 3.0–12.0)
NEUTROS PCT: 72.7 % (ref 43.0–77.0)
Neutro Abs: 6.3 10*3/uL (ref 1.4–7.7)
PLATELETS: 239 10*3/uL (ref 150.0–400.0)
RBC: 5.51 Mil/uL (ref 4.22–5.81)
RDW: 17.7 % — ABNORMAL HIGH (ref 11.5–15.5)
WBC: 8.6 10*3/uL (ref 4.0–10.5)

## 2016-05-18 LAB — IRON: IRON: 71 ug/dL (ref 42–165)

## 2016-05-18 NOTE — Progress Notes (Signed)
AWV reviewed and agree.  Signed:  Crissie Sickles, MD           05/18/2016

## 2016-05-18 NOTE — Patient Instructions (Addendum)
Bring a copy of your advance directives to your next office visit.  Continue doing brain stimulating activities (puzzles, reading, adult coloring books, staying active) to keep memory sharp.      Health Maintenance, Male A healthy lifestyle and preventive care is important for your health and wellness. Ask your health care provider about what schedule of regular examinations is right for you. What should I know about weight and diet?  Eat a Healthy Diet  Eat plenty of vegetables, fruits, whole grains, low-fat dairy products, and lean protein.  Do not eat a lot of foods high in solid fats, added sugars, or salt. Maintain a Healthy Weight  Regular exercise can help you achieve or maintain a healthy weight. You should:  Do at least 150 minutes of exercise each week. The exercise should increase your heart rate and make you sweat (moderate-intensity exercise).  Do strength-training exercises at least twice a week. Watch Your Levels of Cholesterol and Blood Lipids  Have your blood tested for lipids and cholesterol every 5 years starting at 77 years of age. If you are at high risk for heart disease, you should start having your blood tested when you are 77 years old. You may need to have your cholesterol levels checked more often if:  Your lipid or cholesterol levels are high.  You are older than 77 years of age.  You are at high risk for heart disease. What should I know about cancer screening? Many types of cancers can be detected early and may often be prevented. Lung Cancer  You should be screened every year for lung cancer if:  You are a current smoker who has smoked for at least 30 years.  You are a former smoker who has quit within the past 15 years.  Talk to your health care provider about your screening options, when you should start screening, and how often you should be screened. Colorectal Cancer  Routine colorectal cancer screening usually begins at 77 years of age  and should be repeated every 5-10 years until you are 77 years old. You may need to be screened more often if early forms of precancerous polyps or small growths are found. Your health care provider may recommend screening at an earlier age if you have risk factors for colon cancer.  Your health care provider may recommend using home test kits to check for hidden blood in the stool.  A small camera at the end of a tube can be used to examine your colon (sigmoidoscopy or colonoscopy). This checks for the earliest forms of colorectal cancer. Prostate and Testicular Cancer  Depending on your age and overall health, your health care provider may do certain tests to screen for prostate and testicular cancer.  Talk to your health care provider about any symptoms or concerns you have about testicular or prostate cancer. Skin Cancer  Check your skin from head to toe regularly.  Tell your health care provider about any new moles or changes in moles, especially if:  There is a change in a mole's size, shape, or color.  You have a mole that is larger than a pencil eraser.  Always use sunscreen. Apply sunscreen liberally and repeat throughout the day.  Protect yourself by wearing long sleeves, pants, a wide-brimmed hat, and sunglasses when outside. What should I know about heart disease, diabetes, and high blood pressure?  If you are 52-24 years of age, have your blood pressure checked every 3-5 years. If you are 40 years of  age or older, have your blood pressure checked every year. You should have your blood pressure measured twice-once when you are at a hospital or clinic, and once when you are not at a hospital or clinic. Record the average of the two measurements. To check your blood pressure when you are not at a hospital or clinic, you can use:  An automated blood pressure machine at a pharmacy.  A home blood pressure monitor.  Talk to your health care provider about your target blood  pressure.  If you are between 36-71 years old, ask your health care provider if you should take aspirin to prevent heart disease.  Have regular diabetes screenings by checking your fasting blood sugar level.  If you are at a normal weight and have a low risk for diabetes, have this test once every three years after the age of 36.  If you are overweight and have a high risk for diabetes, consider being tested at a younger age or more often.  A one-time screening for abdominal aortic aneurysm (AAA) by ultrasound is recommended for men aged 4-75 years who are current or former smokers. What should I know about preventing infection? Hepatitis B  If you have a higher risk for hepatitis B, you should be screened for this virus. Talk with your health care provider to find out if you are at risk for hepatitis B infection. Hepatitis C  Blood testing is recommended for:  Everyone born from [birth year] through 1966.  Anyone with known risk factors for hepatitis C. Sexually Transmitted Diseases (STDs)  You should be screened each year for STDs including gonorrhea and chlamydia if:  You are sexually active and are younger than 77 years of age.  You are older than 77 years of age and your health care provider tells you that you are at risk for this type of infection.  Your sexual activity has changed since you were last screened and you are at an increased risk for chlamydia or gonorrhea. Ask your health care provider if you are at risk.  Talk with your health care provider about whether you are at high risk of being infected with HIV. Your health care provider may recommend a prescription medicine to help prevent HIV infection. What else can I do?  Schedule regular health, dental, and eye exams.  Stay current with your vaccines (immunizations).  Do not use any tobacco products, such as cigarettes, chewing tobacco, and e-cigarettes. If you need help quitting, ask your health care provider.  Limit  alcohol intake to no more than 2 drinks per day. One drink equals 12 ounces of beer, 5 ounces of wine, or 1 ounces of hard liquor.  Do not use street drugs.  Do not share needles.  Ask your health care provider for help if you need support or information about quitting drugs.  Tell your health care provider if you often feel depressed.  Tell your health care provider if you have ever been abused or do not feel safe at home. This information is not intended to replace advice given to you by your health care provider. Make sure you discuss any questions you have with your health care provider. Document Released: 06/20/2007 Document Revised: 08/21/2015 Document Reviewed: 09/25/2014 Elsevier Interactive Patient Education  2017 [employer].

## 2016-05-18 NOTE — Progress Notes (Signed)
Office Note 05/18/2016  CC:  Chief Complaint  Patient presents with  . Follow-up    RCI, pt is not fasting.   . Medicare Wellness    HPI:  Joshua [name] is a 76 y.o. White male who is here acommpanied by his wife for 3 mo f/u HTN, HLD, chronic diastolic HF, PAF on xarelto. His increased his lasix to 80 mg bid last week for a few days by his home health nurse, also limiting fluid to 2L qd.  He got weak so he resumed 54m daily. Says some swelling came off, breathing a little easier.  Uses 2.5 L oxygen mostly at night while sleeping. Mentioned after the visit that his mood was still depressed on celexa 457m-we decided to focus some on this at f/u in 2 weeks.  HTN: 120s/60s, HR 80s.   Denies palpitations or heart racing.   No CP or dizziness.   No fevers.   Past Medical History:  Diagnosis Date  . Adenomatous colon polyp 10/16/2011   Repeat 2018  . Arthritis     Hips, R>L & KNEES  . CAD, multiple vessel    3V CAD cath 08/08/13----CABG done shortly after.  . 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 pylori neg, no dysplasia or malignancy.  . Chronic diastolic heart failure (HCDayton2016  . Chronic renal insufficiency, stage II (mild)   . COPD (chronic obstructive pulmonary disease) (HCSocorro   GOLD II.  Spirometry  2004 borderline obstruction; 2015 mod obst: noncompliant with bid symbicort so pulmonologist switched him to a once daily inhaler: Trelegy ellipta 12/2015.  . Diabetic nephropathy (HCBelle   Elevated urine microalb/cr 03/2011  . DM type 2 (diabetes mellitus, type 2) (HCSaranap   Poor control on max oral meds--pt eventually agreed to insulin therapy.  As of 2017 his DM is being managed by Dr. KuDwyane Deen endo.  . DOE (dyspnea on exertion)    COPD + chronic diastolic HF  . Erectile dysfunction    Normal testosterone  . Hearing loss of both ears 2016   Hearing aids  . Hyperlipidemia   . Hyperplastic colon polyp 2001  . Hypertension   . 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  . Macular degeneration, dry    Mild, bilat (Optometrist, [place] [facility] [employer] [employer] [place] [facility]. Obesity   . Open toe wound 11/2015   Wound care clinic appt made and then canceled when toe improved.  . Other and unspecified angina pectoris   . PAD (peripheral artery disease) (HCCStevensville2/2018   Abnormal ABI's and waveforms: LE arterial duplex ordered for f/u as per cardiologist's recommendation.  Vasc eval by Dr. CheBridgett Larssonmpression was minimal PAD, recommended maximize med mgmt.  . PMarland KitchenF (paroxysmal atrial fibrillation) (HCCDayton0/2016   xarelto  . Pericardial effusion with cardiac tamponade 6//29/15   pericardiocentesis was done,  Infectious/inflamm (cytology showed NO MALIGNANT CELLS)  . Pleural plaque    asbestos exposure x 2 yrs: Dr. ManVaughan Brownerdered CT chest for further eval on 02/2016  . Pneumonia   . Tobacco dependence in remission    100+ pack-yr hx: quit after CABG    Past Surgical History:  Procedure Laterality Date  . APPENDECTOMY  1957  . CARDIAC CATHETERIZATION  08/08/2013  . CATARACT EXTRACTION W/ INTRAOCULAR LENS  IMPLANT, BILATERAL  04/08/2006 & 04/22/2006  .  CATARACT EXTRACTION W/ INTRAOCULAR LENS  IMPLANT, BILATERAL Bilateral   . CHOLECYSTECTOMY OPEN  1999  . COLONOSCOPY  10/16/2011   Procedure: COLONOSCOPY;  Surgeon: Inda Castle, MD;  Location: [facility];  Service: Endoscopy;  Laterality: N/A;  . CORONARY ARTERY BYPASS GRAFT N/A 08/14/2013   Procedure: CORONARY ARTERY BYPASS GRAFTING (CABG);  Surgeon: Melrose Nakayama, MD;  Location: [place];  Service: Open Heart Surgery;  Laterality: N/A;  Times 4   using left internal mammary artery and endoscopically harvested bilateral saphenous vein  . ESOPHAGOGASTRODUODENOSCOPY  02/25/12   Atrophic gastritis with a few erosions--capsule endoscopy planned as of 02/25/12 (Dr.  Deatra Ina).  [name] HARDWARE REMOVAL Right 08/12/2012   Procedure: HARDWARE REMOVAL;  Surgeon: Mcarthur Rossetti, MD;  Location: [facility];  Service: Orthopedics;  Laterality: Right;  . HIP SURGERY Right 1954   Repair of slipped capital femoral epiphysis.  . INTRAOPERATIVE TRANSESOPHAGEAL ECHOCARDIOGRAM N/A 08/14/2013   Normal LV function. Procedure: INTRAOPERATIVE TRANSESOPHAGEAL ECHOCARDIOGRAM;  Surgeon: Melrose Nakayama, MD;  Location: [facility];  Service: Open Heart Surgery;  Laterality: N/A;  . LEFT HEART CATHETERIZATION WITH CORONARY ANGIOGRAM N/A 08/08/2013   Procedure: LEFT HEART CATHETERIZATION WITH CORONARY ANGIOGRAM;  Surgeon: Jettie Booze, MD;  [facility] C[facility] LAB;  Service: Cardiovascular;  Laterality: N/A;  . PATELLA FRACTURE SURGERY Left ~ 1979   bolt + 3 screws to repair tib plateau fx  . PERICARDIAL TAP N/A 07/01/2013   Procedure: PERICARDIAL TAP;  Surgeon: Jettie Booze, MD;  Location[facility] CATH LAB;  Service: Cardiovascular;  Laterality: N/A;  . TESTICLE SURGERY  as a child   Undescended testicle brought down into scrotum  . TONSILLECTOMY  1947  . TOTAL HIP ARTHROPLASTY Right 08/12/2012   Procedure: REMOVAL OF OLD PINS RIGHT HIP AND RIGHT TOTAL HIP ARTHROPLASTY ANTERIOR APPROACH;  Surgeon: Mcarthur Rossetti, MD;  Location: [facility];  Service: Orthopedics;  Laterality: Right;  . TRANSTHORACIC ECHOCARDIOGRAM  10/30/14   Mod LVH, EF 60-65%, normal wall motion, mod mitral regurg, mild PAH    Family History  Problem Relation Age of Onset  . Heart disease Father   . Heart attack Father   . CVA Mother   . Hypertension Mother   . Diabetes Paternal Grandmother   . Breast cancer Sister   . Diabetes Maternal Uncle   . Heart disease Maternal Uncle   . Stroke Neg Hx     Social History   Social History  . Marital status: Married    Spouse name: [name]  . Number of children: 4  . Years of education: N/A   Occupational History  . bus operator    Social History Main  Topics  . Smoking status: Former Smoker    Packs/day: 0.25    Years: 62.00    Types: Cigarettes    Quit date: 10/23/2015  . Smokeless tobacco: Former Systems developer    Quit date: 06/30/2013  . Alcohol use No  . Drug use: No  . Sexual activity: No   Other Topics Concern  . Not on file   Social History Narrative   Married, 4 children.   Formerly a Medical illustrator.   Local transportation bus driver.  Level of education: HS.     +tobacco--lifelong (still smoking as of 08/2011).  No alcohol or drugs.   No exercise.  +excessive caffeine.    Outpatient Medications Prior to Visit  Medication Sig Dispense Refill  . acetaminophen (TYLENOL) 325 MG tablet Take 2 tablets (650  mg total) by mouth every 4 (four) hours as needed for headache or mild pain.    [name] amLODipine (NORVASC) 5 MG tablet Take 1 tablet (5 mg total) by mouth daily. 30 tablet 11  . Ascorbic Acid (VITAMIN C) 1000 MG tablet Take 1,000 mg by mouth daily.    [name] aspirin 81 MG tablet Take 81 mg by mouth at bedtime. Reported on 02/26/2015    . atorvastatin (LIPITOR) 40 MG tablet TAKE ONE TABLET BY MOUTH EVERY DAY 30 tablet 6  . budesonide-formoterol (SYMBICORT) 160-4.5 MCG/ACT inhaler Inhale 2 puffs into the lungs 2 (two) times daily. 1 Inhaler 12  . buPROPion (WELLBUTRIN SR) 150 MG 12 hr tablet TAKE ONE TABLET BY MOUTH TWICE DAILY 60 tablet 6  . Cholecalciferol (VITAMIN D-3) 1000 units CAPS Take 1,000 Units by mouth daily. Reported on 02/26/2015    . Chromium Picolinate 1000 MCG TABS Take 1,000 mcg by mouth daily. Reported on 02/26/2015    . citalopram (CELEXA) 40 MG tablet Take 1 tablet (40 mg total) by mouth daily. 90 tablet 3  . diltiazem (CARDIZEM CD) 240 MG 24 hr capsule Take 1 capsule (240 mg total) by mouth daily. 30 capsule 1  . fluticasone (FLONASE) 50 MCG/ACT nasal spray Place 2 sprays into both nostrils daily. 16 g 1  . furosemide (LASIX) 40 MG tablet Take 40 mg by mouth daily.    [name] gabapentin (NEURONTIN) 100 MG capsule  TAKE 2 CAPSULES BY MOUTH 3 TIMES DAILY (Patient taking differently: take 2 capsules in the morning and 3 capsules at bedtime) 180 capsule 6  . guaiFENesin (MUCINEX) 600 MG 12 hr tablet Take 2 tablets (1,200 mg total) by mouth 2 (two) times daily. 30 tablet 0  . Insulin Glargine (TOUJEO SOLOSTAR) 300 UNIT/ML SOPN Inject 70 Units into the skin daily. 6 pen 1  . Insulin Pen Needle 32G X 5 MM MISC Use to inject insulin TID as directed by physician. 100 each 5  . insulin regular (NOVOLIN R,HUMULIN R) 100 units/mL injection Inject 16 units SQ with BF, 17 units SQ with Lunch, and 18 U SQ with Supper (Patient taking differently: 28 units in the am and at lunch and 28 at bedtime) 1 vial 6  . isosorbide mononitrate (IMDUR) 30 MG 24 hr tablet TAKE ONE TABLET BY MOUTH DAILY 90 tablet 1  . Multiple Vitamin (MULTIVITAMIN WITH MINERALS) TABS Take 1 tablet by mouth daily.    . nitroGLYCERIN (NITROSTAT) 0.4 MG SL tablet Place 1 tablet (0.4 mg total) under the tongue every 5 (five) minutes as needed for chest pain. 25 tablet 3  . pioglitazone (ACTOS) 45 MG tablet Take 45 mg by mouth daily. Reported on 02/26/2015    . PROAIR HFA 108 (90 Base) MCG/ACT inhaler INHALE 2 PUFFS INTO THE LUNGS EVERY 4 HOURS AS NEEDED FOR WHEEZING ORSHORTNESS OF BREATH 8.5 g 1  . rivaroxaban (XARELTO) 20 MG TABS tablet Take 1 tablet (20 mg total) by mouth daily with supper. 90 tablet 1  . Spacer/Aero Chamber [employer] Use as directed with Symbicort and Proair inhaler 1 each 1  . SYNJARDY 05-998 MG TABS TAKE ONE TABLET BY MOUTH TWICE DAILY AFTER A MEAL 60 tablet 3  . tamsulosin (FLOMAX) 0.4 MG CAPS capsule TAKE 1 CAPSULE BY MOUTH EVERY DAY AFTER SUPPER 90 capsule 1   No facility-administered medications prior to visit.     Allergies  Allergen Reactions  . Morphine And Related Other (See Comments)    Drenched with perspiration  .  Demerol [Meperidine] Nausea Only  . Starlix [Nateglinide] Other (See Comments)    gassy     ROS Review of Systems  Constitutional: Positive for fatigue. Negative for appetite change, chills and fever.  HENT: Negative for congestion, dental problem, ear pain and sore throat.   Eyes: Negative for discharge, redness and visual disturbance.  Respiratory: Positive for shortness of breath (with exertion). Negative for cough, chest tightness and wheezing.   Cardiovascular: Negative for chest pain, palpitations and leg swelling.  Gastrointestinal: Negative for abdominal pain, blood in stool, diarrhea, nausea and vomiting.  Genitourinary: Positive for frequency (on increased dose of diuretic). Negative for difficulty urinating, dysuria, flank pain, hematuria and urgency.  Musculoskeletal: Negative for arthralgias, back pain, joint swelling, myalgias and neck stiffness.  Skin: Negative for pallor and rash.  Neurological: Negative for dizziness, speech difficulty, weakness and headaches.  Hematological: Negative for adenopathy. Does not bruise/bleed easily.  Psychiatric/Behavioral: Positive for dysphoric mood. Negative for confusion and sleep disturbance. The patient is not nervous/anxious.     PE; Blood pressure 124/65, pulse 80, temperature 98 F (36.7 C), temperature source Oral, resp. rate 16, height _0  (1.803 m), weight 252 lb (114.3 kg), SpO2 92 %. Gen: Alert, well appearing.  Patient is oriented to person, place, time, and situation. AFFECT: pleasant but not as jovial as his norm, lucid thought and speech. CV: RRR, no m/r/g LUNGS: question of mild bibasilar soft insp crackles, L>R.  Nonlabored resps. EXT: no clubbing or cyanosis.  1+ bilat LE pitting edema.  Pertinent labs:  Lab Results  Component Value Date   TSH 0.918 07/01/2013   Lab Results  Component Value Date   WBC 15.2 (H) 10/31/2015   HGB 14.8 10/31/2015   HCT 46.5 10/31/2015   MCV 79.6 (L) 10/31/2015   PLT 223 10/31/2015   Lab Results  Component Value Date   IRON 21 (L) 06/30/2013   FERRITIN 200.6  06/30/2013    Lab Results  Component Value Date   CREATININE 1.00 02/19/2016   BUN 23 02/19/2016   NA 141 02/19/2016   K 4.7 02/19/2016   CL 105 02/19/2016   CO2 29 02/19/2016   Lab Results  Component Value Date   ALT 24 02/19/2016   AST 18 02/19/2016   ALKPHOS 107 02/19/2016   BILITOT 0.6 02/19/2016   Lab Results  Component Value Date   CHOL 119 02/19/2016   Lab Results  Component Value Date   HDL 43.30 02/19/2016   Lab Results  Component Value Date   LDLCALC 44 02/19/2016   Lab Results  Component Value Date   TRIG 159.0 (H) 02/19/2016   Lab Results  Component Value Date   CHOLHDL 3 02/19/2016   Lab Results  Component Value Date   PSA 1.33 09/01/2011   Lab Results  Component Value Date   HGBA1C 7.5 05/06/2016    ASSESSMENT AND PLAN:   1) HTN; The current medical regimen is effective;  continue present plan and medications. Lytes/cr today.  2) HLD: tolerating statin.  AST/ALT and lipids excellent 02/19/16.  3) Chronic diastolic FH: recently some volume overload noted by [facility] nurse so lasix briefly increased to 37m bid--he only withstood this x 3d before getting too fatigued.  Will have him take his lasix at 80 mg qd now, check lytes/cr.  Check CXR since it sounds like he is not quite back to baseline regarding SOB with exertion.    4) Hx of iron def anemia, likely from 2 AVMs found  on capsule endoscopy: q6 mo CBCs with iron panel: ordered today.  Chronic iron supplement.  5) PAF, on rate control + xarelto.  Sounds like he is in sinus rhythm today. No sign of bleeding.  An After Visit Summary was printed and given to the patient.  FOLLOW UP:  Return in about 2 weeks (around 06/01/2016), or f/u CHF.---also f/u depression.  Signed:  Crissie Sickles, MD           05/18/2016

## 2016-05-18 NOTE — Progress Notes (Signed)
Subjective:   Joshua [name] is a 76 y.o. male who presents for an Initial Medicare Annual Wellness Visit. Accompanied by wife.   Review of Systems  No ROS.  Medicare Wellness Visit.  Cardiac Risk Factors include: advanced age (>68mn, >>60women);diabetes mellitus;dyslipidemia;family history of premature cardiovascular disease;hypertension;male gender;obesity (BMI >30kg/m2);sedentary lifestyle   Sleep patterns: Sleeps 8-10 hours, feels rested. Naps during the day. Up to void x 2. Home Safety/Smoke Alarms:  Smoke detectors in place.  Living environment; residence and Firearm Safety: Lives with wife and sister in law in 1 story home. Steps at door with rail and ramp. Feels safe at home.  Seat Belt Safety/Bike Helmet: Wears seat belt.   Counseling:   Eye Exam-Last exam  01/2016. Every 6 months Dr. BFelton Clinton([facility]-Full dentures, gum care discussed.   Male:   CCS-Colonoscopy 10/16/2011, polyps. Recall 5 years.  PSA-  Lab Results  Component Value Date   PSA 1.33 09/01/2011      Objective:    Today's Vitals   05/18/16 0801  BP: 124/65  Pulse: 80  Resp: 16  Temp: 98 F (36.7 C)  TempSrc: Oral  SpO2: 92%  Weight: 252 lb (114.3 kg)  Height: _0  (1.803 m)   Body mass index is 35.15 kg/m.  Current Medications (verified) Outpatient Encounter Prescriptions as of 05/18/2016  Medication Sig  . acetaminophen (TYLENOL) 325 MG tablet Take 2 tablets (650 mg total) by mouth every 4 (four) hours as needed for headache or mild pain.  .Marland KitchenamLODipine (NORVASC) 5 MG tablet Take 1 tablet (5 mg total) by mouth daily.  . Ascorbic Acid (VITAMIN C) 1000 MG tablet Take 1,000 mg by mouth daily.  .Marland Kitchenaspirin 81 MG tablet Take 81 mg by mouth at bedtime. Reported on 02/26/2015  . atorvastatin (LIPITOR) 40 MG tablet TAKE ONE TABLET BY MOUTH EVERY DAY  . budesonide-formoterol (SYMBICORT) 160-4.5 MCG/ACT inhaler Inhale 2 puffs into the lungs 2 (two) times daily.  .Marland KitchenbuPROPion (WELLBUTRIN  SR) 150 MG 12 hr tablet TAKE ONE TABLET BY MOUTH TWICE DAILY  . Cholecalciferol (VITAMIN D-3) 1000 units CAPS Take 1,000 Units by mouth daily. Reported on 02/26/2015  . Chromium Picolinate 1000 MCG TABS Take 1,000 mcg by mouth daily. Reported on 02/26/2015  . citalopram (CELEXA) 40 MG tablet Take 1 tablet (40 mg total) by mouth daily.  .Marland Kitchendiltiazem (CARDIZEM CD) 240 MG 24 hr capsule Take 1 capsule (240 mg total) by mouth daily.  . fluticasone (FLONASE) 50 MCG/ACT nasal spray Place 2 sprays into both nostrils daily.  . furosemide (LASIX) 40 MG tablet Take 40 mg by mouth daily.  .Marland Kitchengabapentin (NEURONTIN) 100 MG capsule TAKE 2 CAPSULES BY MOUTH 3 TIMES DAILY (Patient taking differently: take 2 capsules in the morning and 3 capsules at bedtime)  . guaiFENesin (MUCINEX) 600 MG 12 hr tablet Take 2 tablets (1,200 mg total) by mouth 2 (two) times daily.  . Insulin Glargine (TOUJEO SOLOSTAR) 300 UNIT/ML SOPN Inject 70 Units into the skin daily.  . Insulin Pen Needle 32G X 5 MM MISC Use to inject insulin TID as directed by physician.  . insulin regular (NOVOLIN R,HUMULIN R) 100 units/mL injection Inject 16 units SQ with BF, 17 units SQ with Lunch, and 18 U SQ with Supper (Patient taking differently: 28 units in the am and at lunch and 28 at bedtime)  . isosorbide mononitrate (IMDUR) 30 MG 24 hr tablet TAKE ONE TABLET BY MOUTH DAILY  . Multiple Vitamin (  MULTIVITAMIN WITH MINERALS) TABS Take 1 tablet by mouth daily.  . nitroGLYCERIN (NITROSTAT) 0.4 MG SL tablet Place 1 tablet (0.4 mg total) under the tongue every 5 (five) minutes as needed for chest pain.  . pioglitazone (ACTOS) 45 MG tablet Take 45 mg by mouth daily. Reported on 02/26/2015  . PROAIR HFA 108 (90 Base) MCG/ACT inhaler INHALE 2 PUFFS INTO THE LUNGS EVERY 4 HOURS AS NEEDED FOR WHEEZING ORSHORTNESS OF BREATH  . rivaroxaban (XARELTO) 20 MG TABS tablet Take 1 tablet (20 mg total) by mouth daily with supper.  [name] Spacer/Aero Chamber [employer] Use as  directed with Symbicort and Proair inhaler  . SYNJARDY 05-998 MG TABS TAKE ONE TABLET BY MOUTH TWICE DAILY AFTER A MEAL  . tamsulosin (FLOMAX) 0.4 MG CAPS capsule TAKE 1 CAPSULE BY MOUTH EVERY DAY AFTER SUPPER   No facility-administered encounter medications on file as of 05/18/2016.     Allergies (verified) Morphine and related; Demerol [meperidine]; and Starlix [nateglinide]   History: Past Medical History:  Diagnosis Date  . Adenomatous colon polyp 10/16/2011   Repeat 2018  . Arthritis     Hips, R>L & KNEES  . CAD, multiple vessel    3V CAD cath 08/08/13----CABG done shortly after.  . 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 pylori neg, no dysplasia or malignancy.  . Chronic diastolic heart failure ([place]) 2016  . Chronic renal insufficiency, stage II (mild)   . COPD (chronic obstructive pulmonary disease) ([place])    GOLD II.  Spirometry  2004 borderline obstruction; 2015 mod obst: noncompliant with bid symbicort so pulmonologist switched him to a once daily inhaler: Trelegy ellipta 12/2015.  . Diabetic nephropathy ([place])    Elevated urine microalb/cr 03/2011  . DM type 2 (diabetes mellitus, type 2) ([place])    Poor control on max oral meds--pt eventually agreed to insulin therapy.  As of 2017 his DM is being managed by Dr. Dwyane Dee in endo.  . DOE (dyspnea on exertion)    COPD + chronic diastolic HF  . Erectile dysfunction    Normal testosterone  . Hearing loss of both ears 2016   Hearing aids  . Hyperlipidemia   . Hyperplastic colon polyp 2001  . Hypertension   . Iron deficiency anemia 2014   03/2012 capsule endoscopy showed 2 AVMs--likely responsible for his IDA--lifetime iron supp recommended + q52moCBCs.  . Macular degeneration, dry    Mild, bilat (Optometrist, [place]ford Knifeat [place]  . Obesity   . Open toe wound 11/2015   Wound care clinic appt made and then canceled when toe improved.  . Other and unspecified  angina pectoris   . PAD (peripheral artery disease) ([place] 02/2016   Abnormal ABI's and waveforms: LE arterial duplex ordered for f/u as per cardiologist's recommendation.  Vasc eval by Dr. CBridgett Larsson impression was minimal PAD, recommended maximize med mgmt.  .Marland KitchenPAF (paroxysmal atrial fibrillation) ([place] 10/2014   xarelto  . Pericardial effusion with cardiac tamponade 6//29/15   pericardiocentesis was done,  Infectious/inflamm (cytology showed NO MALIGNANT CELLS)  . Pleural plaque    asbestos exposure x 2 yrs: Dr. MVaughan Brownerordered CT chest for further eval on 02/2016  . Pneumonia   . Tobacco dependence in remission    100+ pack-yr hx: quit after CABG   Past Surgical History:  Procedure Laterality Date  . APPENDECTOMY  1957  . CARDIAC CATHETERIZATION  08/08/2013  . CATARACT EXTRACTION W/ INTRAOCULAR  LENS  IMPLANT, BILATERAL  04/08/2006 & 04/22/2006  . CATARACT EXTRACTION W/ INTRAOCULAR LENS  IMPLANT, BILATERAL Bilateral   . CHOLECYSTECTOMY OPEN  1999  . COLONOSCOPY  10/16/2011   Procedure: COLONOSCOPY;  Surgeon: Inda Castle, MD;  Location: [facility];  Service: Endoscopy;  Laterality: N/A;  . CORONARY ARTERY BYPASS GRAFT N/A 08/14/2013   Procedure: CORONARY ARTERY BYPASS GRAFTING (CABG);  Surgeon: Melrose Nakayama, MD;  Location: [place] [facility];  Service: Open Heart Surgery;  Laterality: N/A;  Times 4   using left internal mammary artery and endoscopically harvested bilateral saphenous vein  . ESOPHAGOGASTRODUODENOSCOPY  02/25/12   Atrophic gastritis with a few erosions--capsule endoscopy planned as of 02/25/12 (Dr. Deatra Ina).  [name] HARDWARE REMOVAL Right 08/12/2012   Procedure: HARDWARE REMOVAL;  Surgeon: Mcarthur Rossetti, MD;  Location: [facility];  Service: Orthopedics;  Laterality: Right;  . HIP SURGERY Right 1954   Repair of slipped capital femoral epiphysis.  . INTRAOPERATIVE TRANSESOPHAGEAL ECHOCARDIOGRAM N/A 08/14/2013   Normal LV function. Procedure: INTRAOPERATIVE TRANSESOPHAGEAL ECHOCARDIOGRAM;   Surgeon: Melrose Nakayama, MD;  Location: [facility];  Service: Open Heart Surgery;  Laterality: N/A;  . LEFT HEART CATHETERIZATION WITH CORONARY ANGIOGRAM N/A 08/08/2013   Procedure: LEFT HEART CATHETERIZATION WITH CORONARY ANGIOGRAM;  Surgeon: Jettie Booze, MD;  Location: [facility] CATH LAB;  Service: Cardiovascular;  Laterality: N/A;  . PATELLA FRACTURE SURGERY Left ~ 1979   bolt + 3 screws to repair tib plateau fx  . PERICARDIAL TAP N/A 07/01/2013   Procedure: PERICARDIAL TAP;  Surgeon: Jettie Booze, MD;  Location: [facility] CATH LAB;  Service: Cardiovascular;  Laterality: N/A;  . TESTICLE SURGERY  as a child   Undescended testicle brought down into scrotum  . TONSILLECTOMY  1947  . TOTAL HIP ARTHROPLASTY Right 08/12/2012   Procedure: REMOVAL OF OLD PINS RIGHT HIP AND RIGHT TOTAL HIP ARTHROPLASTY ANTERIOR APPROACH;  Surgeon: Mcarthur Rossetti, MD;  Location: [facility];  Service: Orthopedics;  Laterality: Right;  . TRANSTHORACIC ECHOCARDIOGRAM  10/30/14   Mod LVH, EF 60-65%, normal wall motion, mod mitral regurg, mild PAH   Family History  Problem Relation Age of Onset  . Heart disease Father   . Heart attack Father   . CVA Mother   . Hypertension Mother   . Diabetes Paternal Grandmother   . Breast cancer Sister   . Diabetes Maternal Uncle   . Heart disease Maternal Uncle   . Stroke Neg Hx    Social History   Occupational History  . bus operator    Social History Main Topics  . Smoking status: Former Smoker    Packs/day: 0.25    Years: 62.00    Types: Cigarettes    Quit date: 10/23/2015  . Smokeless tobacco: Former Systems developer    Quit date: 06/30/2013  . Alcohol use No  . Drug use: No  . Sexual activity: No   Tobacco Counseling Counseling given: No   Activities of Daily Living In your present state of health, do you have any difficulty performing the following activities: 05/18/2016 10/29/2015  Hearing? N N  Vision? N N  Difficulty concentrating or making decisions? N N    Walking or climbing stairs? (No Data) Y  Dressing or bathing? N N  Doing errands, shopping? N N  Preparing Food and eating ? N N  Using the Toilet? N N  In the past six months, have you accidently leaked urine? N N  Do you have problems with loss of bowel  control? N N  Managing your Medications? N N  Managing your Finances? N N  Housekeeping or managing your Housekeeping? N Y  Some recent data might be hidden    Immunizations and Health Maintenance Immunization History  Administered Date(s) Administered  . Influenza Split 09/16/2011  . Influenza, High Dose Seasonal PF 09/26/2015  . Influenza,inj,Quad PF,36+ Mos 10/03/2012, 10/12/2013, 10/30/2014  . Pneumococcal Conjugate-13 10/12/2013  . Pneumococcal Polysaccharide-23 10/03/2012  . Tdap 12/16/2011, 04/04/2015   Health Maintenance Due  Topic Date Due  . URINE MICROALBUMIN  12/17/2015    Patient Care Team: Tammi Sou, MD as PCP - General (Family Medicine) Inda Castle, MD as Consulting Physician (Gastroenterology) Mcarthur Rossetti, MD as Consulting Physician (Orthopedic Surgery) Jettie Booze, MD as Consulting Physician (Cardiology) Marshell Garfinkel, MD as Consulting Physician (Pulmonary Disease) Elayne Snare, MD as Consulting Physician (Endocrinology) Conrad [place], MD as Consulting Physician (Vascular Surgery)  Indicate any recent Medical Services you may have received from other than [facility] providers in the past year (date may be approximate).    Assessment:   This is a routine wellness examination for Kamali. Physical assessment deferred to PCP.   Hearing/Vision screen Hearing Screening Comments: Has hearing aids, does not wear.  Vision Screening Comments: Wears glasses. H/O cataract surgery   Dietary issues and exercise activities discussed: Current Exercise Habits: The patient does not participate in regular exercise at present, Exercise limited by: cardiac condition(s);orthopedic  condition(s);respiratory conditions(s)   Diet (meal preparation, eat out, water intake, caffeinated beverages, dairy products, fruits and vegetables):  Drinks water and diet soda.   Breakfast: eggs, sausage, bacon, toast, coffee Lunch: sandwich Dinner: lean meat and vegetables.   Discussed heart healthy diet and increasing activity as tolerated.  Goals      Patient Stated   . <enter goal here> (pt-stated)          I want to stay safe and as well as possible at home.    . <enter goal here> (pt-stated)          "Get my health in better shape" by staying as active as possible.       Depression Screen PHQ 2/9 Scores 05/18/2016 01/09/2016 10/29/2015 09/26/2015  PHQ - 2 Score 6 1 0 2  PHQ- 9 Score 10 - - 4  Exception Documentation - - - -    Will discuss with PCP at f/u in 2 weeks.   Fall Risk Fall Risk  05/18/2016 01/09/2016 10/29/2015 09/26/2015 08/01/2015  Falls in the past year? _0   Number falls in past yr: 2 or more - 1 2 or more 2 or more  Injury with Fall? No Yes Yes Yes Yes  Risk Factor Category  - High Fall Risk High Fall Risk - High Fall Risk  Risk for fall due to : - Impaired balance/gait History of fall(s);Impaired balance/gait;Medication side effect - History of fall(s);Impaired balance/gait;Impaired mobility;Medication side effect  Risk for fall due to (comments): - uses cane - - -  Follow up - - Falls evaluation completed;Education provided;Falls prevention discussed Education provided Falls prevention discussed    Cognitive Function: MMSE - Mini Mental State Exam 05/18/2016  Orientation to time 5  Orientation to Place 5  Registration 3  Attention/ Calculation 5  Recall 3  Language- name 2 objects 2  Language- repeat 1  Language- follow 3 step command 3  Language- read & follow direction 1  Write a sentence 1  Copy design 1  Total score 30        Screening Tests Health Maintenance  Topic Date Due  . URINE MICROALBUMIN  12/17/2015  .  OPHTHALMOLOGY EXAM  07/10/2016  . INFLUENZA VACCINE  08/05/2016  . HEMOGLOBIN A1C  11/06/2016  . FOOT EXAM  12/16/2016  . TETANUS/TDAP  04/03/2025  . PNA vac Low Risk Adult  Completed        Plan:    Bring a copy of your advance directives to your next office visit.  Continue doing brain stimulating activities (puzzles, reading, adult coloring books, staying active) to keep memory sharp.   I have personally reviewed and noted the following in the patient's chart:   . Medical and social history . Use of alcohol, tobacco or illicit drugs  . Current medications and supplements . Functional ability and status . Nutritional status . Physical activity . Advanced directives . List of other physicians . Hospitalizations, surgeries, and ER visits in previous 12 months . Vitals . Screenings to include cognitive, depression, and falls . Referrals and appointments  In addition, I have reviewed and discussed with patient certain preventive protocols, quality metrics, and best practice recommendations. A written personalized care plan for preventive services as well as general preventive health recommendations were provided to patient.     Gerilyn Nestle, RN   05/18/2016

## 2016-05-19 LAB — IRON AND TIBC
%SAT: 19 % (ref 15–60)
IRON: 76 ug/dL (ref 50–180)
TIBC: 406 ug/dL (ref 250–425)
UIBC: 330 ug/dL

## 2016-05-25 DIAGNOSIS — J449 Chronic obstructive pulmonary disease, unspecified: Secondary | ICD-10-CM | POA: Diagnosis not present

## 2016-06-02 ENCOUNTER — Telehealth: Payer: Self-pay | Admitting: Pulmonary Disease

## 2016-06-02 ENCOUNTER — Ambulatory Visit (INDEPENDENT_AMBULATORY_CARE_PROVIDER_SITE_OTHER)
Admission: RE | Admit: 2016-06-02 | Discharge: 2016-06-02 | Disposition: A | Discharge status: Home / Self Care | Payer: Medicare Other | Source: Ambulatory Visit | Attending: Pulmonary Disease | Admitting: Pulmonary Disease

## 2016-06-02 DIAGNOSIS — J811 Chronic pulmonary edema: Secondary | ICD-10-CM | POA: Diagnosis not present

## 2016-06-02 DIAGNOSIS — R911 Solitary pulmonary nodule: Secondary | ICD-10-CM | POA: Diagnosis not present

## 2016-06-02 DIAGNOSIS — Z8709 Personal history of other diseases of the respiratory system: Secondary | ICD-10-CM | POA: Diagnosis not present

## 2016-06-02 DIAGNOSIS — Z77090 Contact with and (suspected) exposure to asbestos: Secondary | ICD-10-CM

## 2016-06-02 MED ORDER — AZITHROMYCIN 250 MG PO TABS: ORAL_TABLET | ORAL | 0 refills | Status: DC

## 2016-06-02 NOTE — Telephone Encounter (Signed)
Spoke with patient and his wife about CT scan results. They both verbalized understanding about results and medication being called in. Nothing further needed at time of call.

## 2016-06-02 NOTE — Telephone Encounter (Signed)
Will send to Dr Chase Caller to advise as Dr Vaughan Browner is not available. Thanks.

## 2016-06-02 NOTE — Telephone Encounter (Signed)
Call report received from Opal Sidles at [facility] Dr Vaughan Browner please see Impression #2. Thanks.   IMPRESSION: 1. Previously described apical right upper lobe ground-glass pulmonary nodule on 07/02/2013 chest CT is absent on today's scan, compatible with a resolved inflammatory nodule . 2. New mild patchy peribronchovascular consolidation in the right upper lobe. Mild patchy ground-glass attenuation in both lungs. These findings are favored to represent a mild bronchopneumonia. 3. Bilateral calcified pleural plaques. Small right and trace left dependent pleural effusions. 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 4. Mild patchy subpleural reticulation and parenchymal banding throughout both lungs, mildly increased in the interval, compatible with asbestosis. No significant traction bronchiectasis or frank honeycombing. 5. Aortic atherosclerosis. Left main and 3 vessel coronary atherosclerosis status post CABG. 6. Stable dilated main pulmonary artery, suggesting chronic pulmonary arterial hypertension. 7. Stable mild mediastinal lymphadenopathy, compatible with benign reactive adenopathy. These results will be called to the ordering clinician or representative by the Radiologist Assistant, and communication documented in the PACS or zVision Dashboard.

## 2016-06-02 NOTE — Telephone Encounter (Signed)
Please call the patient and let him know the CT shows evidence of asbestos exposure. We will continue to monitor this. CT also shows mild pneumonia. Call in Z pack.  PM

## 2016-06-03 ENCOUNTER — Ambulatory Visit: Payer: Medicare Other | Admitting: Family Medicine

## 2016-06-04 ENCOUNTER — Other Ambulatory Visit: Payer: Self-pay

## 2016-06-04 ENCOUNTER — Encounter (HOSPITAL_COMMUNITY): Payer: Self-pay | Admitting: General Practice

## 2016-06-04 ENCOUNTER — Emergency Department (HOSPITAL_COMMUNITY): Payer: Medicare Other

## 2016-06-04 ENCOUNTER — Inpatient Hospital Stay (HOSPITAL_COMMUNITY)
Admission: EM | Admit: 2016-06-04 | Discharge: 2016-06-11 | DRG: 286 | Disposition: A | Discharge status: Home Health | Payer: Medicare Other | Attending: Cardiology | Admitting: Cardiology

## 2016-06-04 ENCOUNTER — Ambulatory Visit (INDEPENDENT_AMBULATORY_CARE_PROVIDER_SITE_OTHER): Payer: Medicare Other | Admitting: Family Medicine

## 2016-06-04 ENCOUNTER — Encounter: Payer: Self-pay | Admitting: Family Medicine

## 2016-06-04 VITALS — BP 109/61 | HR 136 | Temp 98.0°F | Resp 20 | Ht 71.0 in | Wt 254.2 lb

## 2016-06-04 DIAGNOSIS — J440 Chronic obstructive pulmonary disease with acute lower respiratory infection: Secondary | ICD-10-CM | POA: Diagnosis present

## 2016-06-04 DIAGNOSIS — R000 Tachycardia, unspecified: Secondary | ICD-10-CM | POA: Diagnosis present

## 2016-06-04 DIAGNOSIS — E1165 Type 2 diabetes mellitus with hyperglycemia: Secondary | ICD-10-CM | POA: Diagnosis not present

## 2016-06-04 DIAGNOSIS — Z7901 Long term (current) use of anticoagulants: Secondary | ICD-10-CM | POA: Diagnosis not present

## 2016-06-04 DIAGNOSIS — N181 Chronic kidney disease, stage 1: Secondary | ICD-10-CM | POA: Diagnosis not present

## 2016-06-04 DIAGNOSIS — I2723 Pulmonary hypertension due to lung diseases and hypoxia: Secondary | ICD-10-CM | POA: Diagnosis not present

## 2016-06-04 DIAGNOSIS — I480 Paroxysmal atrial fibrillation: Secondary | ICD-10-CM | POA: Diagnosis not present

## 2016-06-04 DIAGNOSIS — E118 Type 2 diabetes mellitus with unspecified complications: Secondary | ICD-10-CM

## 2016-06-04 DIAGNOSIS — I2510 Atherosclerotic heart disease of native coronary artery without angina pectoris: Secondary | ICD-10-CM | POA: Diagnosis present

## 2016-06-04 DIAGNOSIS — E872 Acidosis: Secondary | ICD-10-CM | POA: Diagnosis present

## 2016-06-04 DIAGNOSIS — J61 Pneumoconiosis due to asbestos and other mineral fibers: Secondary | ICD-10-CM | POA: Diagnosis not present

## 2016-06-04 DIAGNOSIS — J180 Bronchopneumonia, unspecified organism: Secondary | ICD-10-CM

## 2016-06-04 DIAGNOSIS — N183 Chronic kidney disease, stage 3 unspecified: Secondary | ICD-10-CM | POA: Diagnosis present

## 2016-06-04 DIAGNOSIS — I130 Hypertensive heart and chronic kidney disease with heart failure and stage 1 through stage 4 chronic kidney disease, or unspecified chronic kidney disease: Secondary | ICD-10-CM | POA: Diagnosis not present

## 2016-06-04 DIAGNOSIS — E669 Obesity, unspecified: Secondary | ICD-10-CM | POA: Diagnosis present

## 2016-06-04 DIAGNOSIS — Z794 Long term (current) use of insulin: Secondary | ICD-10-CM | POA: Diagnosis not present

## 2016-06-04 DIAGNOSIS — N182 Chronic kidney disease, stage 2 (mild): Secondary | ICD-10-CM | POA: Diagnosis present

## 2016-06-04 DIAGNOSIS — E1151 Type 2 diabetes mellitus with diabetic peripheral angiopathy without gangrene: Secondary | ICD-10-CM | POA: Diagnosis present

## 2016-06-04 DIAGNOSIS — Z7982 Long term (current) use of aspirin: Secondary | ICD-10-CM

## 2016-06-04 DIAGNOSIS — Z9841 Cataract extraction status, right eye: Secondary | ICD-10-CM

## 2016-06-04 DIAGNOSIS — R079 Chest pain, unspecified: Secondary | ICD-10-CM | POA: Diagnosis not present

## 2016-06-04 DIAGNOSIS — E1122 Type 2 diabetes mellitus with diabetic chronic kidney disease: Secondary | ICD-10-CM | POA: Diagnosis present

## 2016-06-04 DIAGNOSIS — I110 Hypertensive heart disease with heart failure: Secondary | ICD-10-CM | POA: Diagnosis not present

## 2016-06-04 DIAGNOSIS — I447 Left bundle-branch block, unspecified: Secondary | ICD-10-CM | POA: Diagnosis not present

## 2016-06-04 DIAGNOSIS — J9601 Acute respiratory failure with hypoxia: Secondary | ICD-10-CM | POA: Diagnosis not present

## 2016-06-04 DIAGNOSIS — Z961 Presence of intraocular lens: Secondary | ICD-10-CM | POA: Diagnosis present

## 2016-06-04 DIAGNOSIS — E785 Hyperlipidemia, unspecified: Secondary | ICD-10-CM | POA: Diagnosis present

## 2016-06-04 DIAGNOSIS — K5900 Constipation, unspecified: Secondary | ICD-10-CM | POA: Diagnosis present

## 2016-06-04 DIAGNOSIS — E876 Hypokalemia: Secondary | ICD-10-CM | POA: Diagnosis present

## 2016-06-04 DIAGNOSIS — Z96641 Presence of right artificial hip joint: Secondary | ICD-10-CM | POA: Diagnosis present

## 2016-06-04 DIAGNOSIS — R0603 Acute respiratory distress: Secondary | ICD-10-CM | POA: Diagnosis present

## 2016-06-04 DIAGNOSIS — E1142 Type 2 diabetes mellitus with diabetic polyneuropathy: Secondary | ICD-10-CM | POA: Diagnosis present

## 2016-06-04 DIAGNOSIS — I10 Essential (primary) hypertension: Secondary | ICD-10-CM | POA: Diagnosis not present

## 2016-06-04 DIAGNOSIS — J449 Chronic obstructive pulmonary disease, unspecified: Secondary | ICD-10-CM | POA: Diagnosis present

## 2016-06-04 DIAGNOSIS — F329 Major depressive disorder, single episode, unspecified: Secondary | ICD-10-CM | POA: Diagnosis present

## 2016-06-04 DIAGNOSIS — Z79899 Other long term (current) drug therapy: Secondary | ICD-10-CM

## 2016-06-04 DIAGNOSIS — I5043 Acute on chronic combined systolic (congestive) and diastolic (congestive) heart failure: Secondary | ICD-10-CM | POA: Diagnosis not present

## 2016-06-04 DIAGNOSIS — I5021 Acute systolic (congestive) heart failure: Secondary | ICD-10-CM | POA: Diagnosis not present

## 2016-06-04 DIAGNOSIS — I5033 Acute on chronic diastolic (congestive) heart failure: Secondary | ICD-10-CM | POA: Diagnosis not present

## 2016-06-04 DIAGNOSIS — J9621 Acute and chronic respiratory failure with hypoxia: Secondary | ICD-10-CM | POA: Diagnosis present

## 2016-06-04 DIAGNOSIS — J441 Chronic obstructive pulmonary disease with (acute) exacerbation: Secondary | ICD-10-CM | POA: Diagnosis present

## 2016-06-04 DIAGNOSIS — Z6835 Body mass index (BMI) 35.0-35.9, adult: Secondary | ICD-10-CM

## 2016-06-04 DIAGNOSIS — I255 Ischemic cardiomyopathy: Secondary | ICD-10-CM | POA: Diagnosis present

## 2016-06-04 DIAGNOSIS — Z87891 Personal history of nicotine dependence: Secondary | ICD-10-CM

## 2016-06-04 DIAGNOSIS — Z452 Encounter for adjustment and management of vascular access device: Secondary | ICD-10-CM | POA: Diagnosis not present

## 2016-06-04 DIAGNOSIS — R0902 Hypoxemia: Secondary | ICD-10-CM

## 2016-06-04 DIAGNOSIS — I340 Nonrheumatic mitral (valve) insufficiency: Secondary | ICD-10-CM | POA: Diagnosis not present

## 2016-06-04 DIAGNOSIS — Z9981 Dependence on supplemental oxygen: Secondary | ICD-10-CM

## 2016-06-04 DIAGNOSIS — I509 Heart failure, unspecified: Secondary | ICD-10-CM | POA: Diagnosis not present

## 2016-06-04 DIAGNOSIS — I5032 Chronic diastolic (congestive) heart failure: Secondary | ICD-10-CM | POA: Diagnosis not present

## 2016-06-04 DIAGNOSIS — J069 Acute upper respiratory infection, unspecified: Secondary | ICD-10-CM

## 2016-06-04 DIAGNOSIS — R0602 Shortness of breath: Secondary | ICD-10-CM | POA: Diagnosis not present

## 2016-06-04 DIAGNOSIS — E1121 Type 2 diabetes mellitus with diabetic nephropathy: Secondary | ICD-10-CM | POA: Diagnosis present

## 2016-06-04 DIAGNOSIS — Z951 Presence of aortocoronary bypass graft: Secondary | ICD-10-CM

## 2016-06-04 DIAGNOSIS — I2722 Pulmonary hypertension due to left heart disease: Secondary | ICD-10-CM | POA: Diagnosis present

## 2016-06-04 DIAGNOSIS — J438 Other emphysema: Secondary | ICD-10-CM | POA: Diagnosis not present

## 2016-06-04 DIAGNOSIS — I4891 Unspecified atrial fibrillation: Secondary | ICD-10-CM | POA: Diagnosis not present

## 2016-06-04 DIAGNOSIS — Z9842 Cataract extraction status, left eye: Secondary | ICD-10-CM

## 2016-06-04 DIAGNOSIS — I5031 Acute diastolic (congestive) heart failure: Secondary | ICD-10-CM | POA: Diagnosis not present

## 2016-06-04 DIAGNOSIS — R05 Cough: Secondary | ICD-10-CM | POA: Diagnosis not present

## 2016-06-04 DIAGNOSIS — N179 Acute kidney failure, unspecified: Secondary | ICD-10-CM | POA: Diagnosis not present

## 2016-06-04 DIAGNOSIS — H3530 Unspecified macular degeneration: Secondary | ICD-10-CM | POA: Diagnosis present

## 2016-06-04 LAB — CBC WITH DIFFERENTIAL/PLATELET
Basophils Absolute: 0 10*3/uL (ref 0.0–0.1)
Basophils Relative: 0 %
Eosinophils Absolute: 0 10*3/uL (ref 0.0–0.7)
Eosinophils Relative: 0 %
HCT: 42.6 % (ref 39.0–52.0)
HEMOGLOBIN: 13.1 g/dL (ref 13.0–17.0)
LYMPHS PCT: 10 %
Lymphs Abs: 1 10*3/uL (ref 0.7–4.0)
MCH: 25.8 pg — AB (ref 26.0–34.0)
MCHC: 30.8 g/dL (ref 30.0–36.0)
MCV: 83.9 fL (ref 78.0–100.0)
MONO ABS: 1.1 10*3/uL — AB (ref 0.1–1.0)
Monocytes Relative: 11 %
NEUTROS PCT: 79 %
Neutro Abs: 7.5 10*3/uL (ref 1.7–7.7)
PLATELETS: ADEQUATE 10*3/uL (ref 150–400)
RBC: 5.08 MIL/uL (ref 4.22–5.81)
RDW: 16.4 % — ABNORMAL HIGH (ref 11.5–15.5)
WBC: 9.6 10*3/uL (ref 4.0–10.5)

## 2016-06-04 LAB — COMPREHENSIVE METABOLIC PANEL
ALT: 18 U/L (ref 17–63)
ANION GAP: 13 (ref 5–15)
AST: 25 U/L (ref 15–41)
Albumin: 3.4 g/dL — ABNORMAL LOW (ref 3.5–5.0)
Alkaline Phosphatase: 81 U/L (ref 38–126)
BUN: 23 mg/dL — ABNORMAL HIGH (ref 6–20)
CHLORIDE: 103 mmol/L (ref 101–111)
CO2: 24 mmol/L (ref 22–32)
Calcium: 9.1 mg/dL (ref 8.9–10.3)
Creatinine, Ser: 1.07 mg/dL (ref 0.61–1.24)
Glucose, Bld: 160 mg/dL — ABNORMAL HIGH (ref 65–99)
POTASSIUM: 3.5 mmol/L (ref 3.5–5.1)
SODIUM: 140 mmol/L (ref 135–145)
Total Bilirubin: 1 mg/dL (ref 0.3–1.2)
Total Protein: 6.3 g/dL — ABNORMAL LOW (ref 6.5–8.1)

## 2016-06-04 LAB — CG4 I-STAT (LACTIC ACID): Lactic Acid, Venous: 2.52 mmol/L (ref 0.5–1.9)

## 2016-06-04 LAB — TROPONIN I
Troponin I: 0.05 ng/mL (ref <0.03)
Troponin I: 0.04 ng/mL (ref <0.03)

## 2016-06-04 LAB — GLUCOSE, CAPILLARY
GLUCOSE-CAPILLARY: 317 mg/dL — AB (ref 65–99)
Glucose-Capillary: 231 mg/dL — ABNORMAL HIGH (ref 65–99)

## 2016-06-04 LAB — BRAIN NATRIURETIC PEPTIDE: B NATRIURETIC PEPTIDE 5: 462.5 pg/mL — AB (ref 0.0–100.0)

## 2016-06-04 LAB — URINALYSIS, ROUTINE W REFLEX MICROSCOPIC
Bacteria, UA: NONE SEEN
Bilirubin Urine: NEGATIVE
Glucose, UA: >=500 mg/dL — AB
Hgb urine dipstick: NEGATIVE
Ketones, ur: NEGATIVE mg/dL
Leukocytes, UA: NEGATIVE
Nitrite: NEGATIVE
Protein, ur: NEGATIVE mg/dL
SPECIFIC GRAVITY, URINE: 1.009 (ref 1.005–1.030)
SQUAMOUS EPITHELIAL / LPF: NONE SEEN
pH: 5 (ref 5.0–8.0)

## 2016-06-04 LAB — MAGNESIUM: MAGNESIUM: 2.1 mg/dL (ref 1.7–2.4)

## 2016-06-04 LAB — PHOSPHORUS: PHOSPHORUS: 4.5 mg/dL (ref 2.5–4.6)

## 2016-06-04 LAB — I-STAT CG4 LACTIC ACID, ED: LACTIC ACID, VENOUS: 2.64 mmol/L — AB (ref 0.5–1.9)

## 2016-06-04 LAB — PROTIME-INR
INR: 1.28
Prothrombin Time: 16.1 seconds — ABNORMAL HIGH (ref 11.4–15.2)

## 2016-06-04 LAB — TSH: TSH: 1.307 u[IU]/mL (ref 0.350–4.500)

## 2016-06-04 LAB — I-STAT TROPONIN, ED: Troponin i, poc: 0.03 ng/mL (ref 0.00–0.08)

## 2016-06-04 LAB — D-DIMER, QUANTITATIVE: D-Dimer, Quant: 1.07 ug/mL-FEU — ABNORMAL HIGH (ref 0.00–0.50)

## 2016-06-04 MED ORDER — ASPIRIN EC 81 MG PO TBEC
81.0000 mg | DELAYED_RELEASE_TABLET | Freq: Every day | ORAL | Status: DC
  Administered 2016-06-04: 81 mg via ORAL
  Filled 2016-06-04: qty 1

## 2016-06-04 MED ORDER — FLUTICASONE PROPIONATE 50 MCG/ACT NA SUSP: 2.0000 | Freq: Every day | NASAL | 1 refills | Status: AC

## 2016-06-04 MED ORDER — CEFTRIAXONE SODIUM 1 G IJ SOLR
1.0000 g | INTRAMUSCULAR | Status: DC
  Administered 2016-06-04 – 2016-06-05 (×2): 1 g via INTRAVENOUS
  Filled 2016-06-04 (×3): qty 10

## 2016-06-04 MED ORDER — METHYLPREDNISOLONE SODIUM SUCC 125 MG IJ SOLR
125.0000 mg | Freq: Once | INTRAMUSCULAR | Status: AC
  Administered 2016-06-04: 125 mg via INTRAVENOUS
  Filled 2016-06-04: qty 2

## 2016-06-04 MED ORDER — TAMSULOSIN HCL 0.4 MG PO CAPS
0.4000 mg | ORAL_CAPSULE | Freq: Every day | ORAL | Status: DC
  Administered 2016-06-04 – 2016-06-10 (×6): 0.4 mg via ORAL
  Filled 2016-06-04 (×7): qty 1

## 2016-06-04 MED ORDER — GUAIFENESIN ER 600 MG PO TB12
1200.0000 mg | ORAL_TABLET | Freq: Two times a day (BID) | ORAL | Status: DC
  Administered 2016-06-04 – 2016-06-11 (×13): 1200 mg via ORAL
  Filled 2016-06-04 (×14): qty 2

## 2016-06-04 MED ORDER — INSULIN ASPART 100 UNIT/ML ~~LOC~~ SOLN: 0.0000 [IU] | Freq: Every day | SUBCUTANEOUS | Status: DC

## 2016-06-04 MED ORDER — METOPROLOL TARTRATE 5 MG/5ML IV SOLN
5.0000 mg | Freq: Once | INTRAVENOUS | Status: AC
  Administered 2016-06-04: 5 mg via INTRAVENOUS
  Filled 2016-06-04: qty 5

## 2016-06-04 MED ORDER — DILTIAZEM HCL ER COATED BEADS 240 MG PO CP24
240.0000 mg | ORAL_CAPSULE | Freq: Every day | ORAL | Status: DC
  Administered 2016-06-05 – 2016-06-07 (×3): 240 mg via ORAL
  Filled 2016-06-04 (×4): qty 1

## 2016-06-04 MED ORDER — AZITHROMYCIN 500 MG PO TABS
500.0000 mg | ORAL_TABLET | ORAL | Status: DC
  Filled 2016-06-04: qty 1

## 2016-06-04 MED ORDER — ONDANSETRON HCL 4 MG PO TABS: 4.0000 mg | ORAL_TABLET | Freq: Four times a day (QID) | ORAL | Status: DC | PRN

## 2016-06-04 MED ORDER — ASPIRIN 81 MG PO TABS: 81.0000 mg | ORAL_TABLET | Freq: Every day | ORAL | Status: DC

## 2016-06-04 MED ORDER — SODIUM CHLORIDE 0.9 % IV SOLN: 250.0000 mL | INTRAVENOUS | Status: DC | PRN

## 2016-06-04 MED ORDER — ONDANSETRON HCL 4 MG/2ML IJ SOLN
4.0000 mg | Freq: Four times a day (QID) | INTRAMUSCULAR | Status: DC | PRN
  Filled 2016-06-04: qty 2

## 2016-06-04 MED ORDER — AZITHROMYCIN 500 MG PO TABS
500.0000 mg | ORAL_TABLET | ORAL | Status: DC
  Administered 2016-06-04 – 2016-06-08 (×5): 500 mg via ORAL
  Filled 2016-06-04 (×5): qty 1

## 2016-06-04 MED ORDER — ZOLPIDEM TARTRATE 5 MG PO TABS
5.0000 mg | ORAL_TABLET | Freq: Every evening | ORAL | Status: AC | PRN
  Administered 2016-06-04: 5 mg via ORAL
  Filled 2016-06-04: qty 1

## 2016-06-04 MED ORDER — ALBUTEROL SULFATE (2.5 MG/3ML) 0.083% IN NEBU
2.5000 mg | INHALATION_SOLUTION | Freq: Two times a day (BID) | RESPIRATORY_TRACT | Status: DC
  Administered 2016-06-05: 2.5 mg via RESPIRATORY_TRACT
  Filled 2016-06-04: qty 3

## 2016-06-04 MED ORDER — FUROSEMIDE 10 MG/ML IJ SOLN
40.0000 mg | Freq: Two times a day (BID) | INTRAMUSCULAR | Status: DC
  Administered 2016-06-04 – 2016-06-05 (×2): 40 mg via INTRAVENOUS
  Filled 2016-06-04 (×2): qty 4

## 2016-06-04 MED ORDER — CITALOPRAM HYDROBROMIDE 20 MG PO TABS
40.0000 mg | ORAL_TABLET | Freq: Every day | ORAL | Status: DC
  Administered 2016-06-05 – 2016-06-08 (×4): 40 mg via ORAL
  Filled 2016-06-04: qty 4
  Filled 2016-06-04 (×3): qty 2

## 2016-06-04 MED ORDER — DEXTROSE 5 % IV SOLN
1.0000 g | INTRAVENOUS | Status: DC
  Filled 2016-06-04: qty 10

## 2016-06-04 MED ORDER — SODIUM CHLORIDE 0.9% FLUSH: 3.0000 mL | Freq: Two times a day (BID) | INTRAVENOUS | Status: DC

## 2016-06-04 MED ORDER — FUROSEMIDE 10 MG/ML IJ SOLN
40.0000 mg | Freq: Once | INTRAMUSCULAR | Status: AC
  Administered 2016-06-04: 40 mg via INTRAVENOUS
  Filled 2016-06-04: qty 4

## 2016-06-04 MED ORDER — ATORVASTATIN CALCIUM 40 MG PO TABS
40.0000 mg | ORAL_TABLET | Freq: Every day | ORAL | Status: DC
  Administered 2016-06-05 – 2016-06-11 (×6): 40 mg via ORAL
  Filled 2016-06-04 (×7): qty 1

## 2016-06-04 MED ORDER — ACETAMINOPHEN 325 MG PO TABS
650.0000 mg | ORAL_TABLET | Freq: Four times a day (QID) | ORAL | Status: DC | PRN
  Administered 2016-06-05 – 2016-06-06 (×2): 650 mg via ORAL
  Filled 2016-06-04 (×2): qty 2

## 2016-06-04 MED ORDER — ALBUTEROL SULFATE (2.5 MG/3ML) 0.083% IN NEBU
2.5000 mg | INHALATION_SOLUTION | RESPIRATORY_TRACT | Status: DC | PRN
  Administered 2016-06-05 – 2016-06-07 (×6): 2.5 mg via RESPIRATORY_TRACT
  Filled 2016-06-04 (×7): qty 3

## 2016-06-04 MED ORDER — SODIUM CHLORIDE 0.9% FLUSH: 3.0000 mL | INTRAVENOUS | Status: DC | PRN

## 2016-06-04 MED ORDER — INSULIN GLARGINE 100 UNIT/ML ~~LOC~~ SOLN
50.0000 [IU] | Freq: Every day | SUBCUTANEOUS | Status: DC
  Administered 2016-06-04 – 2016-06-05 (×2): 50 [IU] via SUBCUTANEOUS
  Filled 2016-06-04 (×3): qty 0.5

## 2016-06-04 MED ORDER — ACETAMINOPHEN 650 MG RE SUPP: 650.0000 mg | Freq: Four times a day (QID) | RECTAL | Status: DC | PRN

## 2016-06-04 MED ORDER — BUPROPION HCL ER (SR) 150 MG PO TB12
150.0000 mg | ORAL_TABLET | Freq: Two times a day (BID) | ORAL | Status: DC
  Administered 2016-06-04 – 2016-06-11 (×13): 150 mg via ORAL
  Filled 2016-06-04 (×14): qty 1

## 2016-06-04 MED ORDER — IPRATROPIUM-ALBUTEROL 0.5-2.5 (3) MG/3ML IN SOLN
3.0000 mL | Freq: Once | RESPIRATORY_TRACT | Status: AC
  Administered 2016-06-04: 3 mL via RESPIRATORY_TRACT
  Filled 2016-06-04: qty 3

## 2016-06-04 MED ORDER — METHYLPREDNISOLONE SODIUM SUCC 40 MG IJ SOLR
40.0000 mg | Freq: Two times a day (BID) | INTRAMUSCULAR | Status: DC
  Administered 2016-06-04 – 2016-06-06 (×4): 40 mg via INTRAVENOUS
  Filled 2016-06-04 (×5): qty 1

## 2016-06-04 MED ORDER — KETOROLAC TROMETHAMINE 15 MG/ML IJ SOLN: 15.0000 mg | Freq: Four times a day (QID) | INTRAMUSCULAR | Status: DC | PRN

## 2016-06-04 MED ORDER — ISOSORBIDE MONONITRATE ER 30 MG PO TB24
30.0000 mg | ORAL_TABLET | Freq: Every day | ORAL | Status: DC
  Administered 2016-06-05 – 2016-06-11 (×6): 30 mg via ORAL
  Filled 2016-06-04 (×7): qty 1

## 2016-06-04 MED ORDER — ALBUTEROL SULFATE (2.5 MG/3ML) 0.083% IN NEBU
2.5000 mg | INHALATION_SOLUTION | Freq: Four times a day (QID) | RESPIRATORY_TRACT | Status: DC
  Administered 2016-06-04 (×2): 2.5 mg via RESPIRATORY_TRACT
  Filled 2016-06-04: qty 3

## 2016-06-04 MED ORDER — ORAL CARE MOUTH RINSE
15.0000 mL | Freq: Two times a day (BID) | OROMUCOSAL | Status: DC
  Administered 2016-06-05: 15 mL via OROMUCOSAL

## 2016-06-04 MED ORDER — SODIUM CHLORIDE 0.9% FLUSH
3.0000 mL | Freq: Two times a day (BID) | INTRAVENOUS | Status: DC
  Administered 2016-06-04 – 2016-06-07 (×6): 3 mL via INTRAVENOUS

## 2016-06-04 MED ORDER — RIVAROXABAN 20 MG PO TABS
20.0000 mg | ORAL_TABLET | Freq: Every day | ORAL | Status: DC
  Administered 2016-06-04 – 2016-06-08 (×5): 20 mg via ORAL
  Filled 2016-06-04 (×6): qty 1

## 2016-06-04 MED ORDER — INSULIN ASPART 100 UNIT/ML ~~LOC~~ SOLN
0.0000 [IU] | Freq: Three times a day (TID) | SUBCUTANEOUS | Status: DC
  Administered 2016-06-04: 3 [IU] via SUBCUTANEOUS
  Administered 2016-06-05: 7 [IU] via SUBCUTANEOUS
  Administered 2016-06-05: 9 [IU] via SUBCUTANEOUS

## 2016-06-04 MED ORDER — METOPROLOL TARTRATE 5 MG/5ML IV SOLN: 5.0000 mg | Freq: Once | INTRAVENOUS | Status: DC

## 2016-06-04 NOTE — Progress Notes (Signed)
CRITICAL VALUE ALERT  Critical Value:  Trop 0.05  Date & Time Notied:  5:51 PM 06/04/2016  Provider Notified: Dr. Marily Memos  Orders Received/Actions taken: Dr. Marily Memos aware. No new orders at this time.

## 2016-06-04 NOTE — Care Management Note (Addendum)
Case Management Note  Patient Details  Name: Joshua [name] MRN: [id number] Date of Birth: [birth year]  Subjective/Objective:          Patient from home with wife. Has home oxygen through [facility]. Patient also has RW, does NOT have home nebulizer. Followed by [facility]. Patient consent to be followed by [facility] at [place], will place consult order for [facility].  Hx COPD asbestos exposure admitted with increased WOB, treated with lasix and steroids. CM will continue to follow.  PCP [facility]: Ramaswany/ Mannam          Action/Plan:   Expected Discharge Date:                  Expected Discharge Plan:  Home/Self Care  In-House Referral:     Discharge planning Services  CM Consult  Post Acute Care Choice:    Choice offered to:     DME Arranged:    DME Agency:     HH Arranged:    [place] Agency:     Status of Service:  In process, will continue to follow  If discussed at Long Length of Stay Meetings, dates discussed:    Additional Comments:  Carles Collet, RN 06/04/2016, 5:05 PM

## 2016-06-04 NOTE — ED Provider Notes (Signed)
[facility] DEPT Provider Note   CSN: [id number] Arrival date & time: 06/04/16  1125     History   Chief Complaint Chief Complaint  Patient presents with  . Shortness of Breath  . Tachycardia    HPI Joshua [name] is a 76 y.o. male.   Shortness of Breath  This is a new problem. The problem occurs continuously.The current episode started more than 2 days ago. The problem has been gradually worsening. Associated symptoms include cough and leg swelling. Pertinent negatives include no fever, no chest pain, no syncope, no vomiting and no abdominal pain. Treatments tried: lasix. The treatment provided no relief. He has had prior hospitalizations. He has had no prior ED visits. He has had no prior ICU admissions. Associated medical issues include COPD, pneumonia, chronic lung disease and heart failure.    Past Medical History:  Diagnosis Date  . Adenomatous colon polyp 10/16/2011   Repeat 2018  . Arthritis     Hips, R>L & KNEES  . CAD, multiple vessel    3V CAD cath 08/08/13----CABG done shortly after.  . 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 pylori neg, no dysplasia or malignancy.  . Chronic diastolic heart failure ([place]) 2016  . Chronic renal insufficiency, stage II (mild)   . COPD (chronic obstructive pulmonary disease) ([place])    GOLD II.  Spirometry  2004 borderline obstruction; 2015 mod obst: noncompliant with bid symbicort so pulmonologist switched him to a once daily inhaler: Trelegy ellipta 12/2015.  . Diabetic nephropathy ([place])    Elevated urine microalb/cr 03/2011  . DM type 2 (diabetes mellitus, type 2) ([place])    Poor control on max oral meds--pt eventually agreed to insulin therapy.  As of 2017 his DM is being managed by Dr. Dwyane Dee in endo.  . DOE (dyspnea on exertion)    COPD + chronic diastolic HF  . Erectile dysfunction    Normal testosterone  . Hearing loss of both ears 2016   Hearing aids  . Hyperlipidemia   . Hyperplastic  colon polyp 2001  . Hypertension   . Iron deficiency anemia 2014   03/2012 capsule endoscopy showed 2 AVMs--likely responsible for his IDA--lifetime iron supp recommended + q53moCBCs.  . Macular degeneration, dry    Mild, bilat (Optometrist, DMayford Knifeat [employer] [place]  . Obesity   . Open toe wound 11/2015   Wound care clinic appt made and then canceled when toe improved.  . Other and unspecified angina pectoris   . PAD (peripheral artery disease) ([place] 02/2016   Abnormal ABI's and waveforms: LE arterial duplex ordered for f/u as per cardiologist's recommendation.  Vasc eval by Dr. CBridgett Larsson impression was minimal PAD, recommended maximize med mgmt.  .Marland KitchenPAF (paroxysmal atrial fibrillation) ([place] 10/2014   xarelto  . Pericardial effusion with cardiac tamponade 6//29/15   pericardiocentesis was done,  Infectious/inflamm (cytology showed NO MALIGNANT CELLS)  . Pleural plaque    asbestos exposure x 2 yrs: Dr. MVaughan Brownerordered CT chest for further eval on 02/2016  . Pneumonia   . Tobacco dependence in remission    100+ pack-yr hx: quit after CABG    Patient Active Problem List   Diagnosis Date Noted  . Hypoxia   . Chronic diastolic CHF (congestive heart failure) ([place] 02/18/2015  . Acute upper respiratory infection 02/06/2015  . Facial cellulitis 11/28/2014  . Abscess of nasal cavity 11/28/2014  . Cellulitis and abscess of neck 11/28/2014  .  Acute on chronic diastolic heart failure ([place]) 11/12/2014  . Dyspnea 10/29/2014  . Cough 10/29/2014  . History of asbestosis 10/29/2014  . Low oxygen saturation 10/29/2014  . Respiratory failure with hypoxia ([place]) 10/29/2014  . COPD exacerbation ([place]) 10/29/2014  . Acute on chronic respiratory failure with hypoxia ([place])   . Diabetes ([place]) 06/18/2014  . Depression 06/11/2014  . Diabetes mellitus with complication ([place]) 41/66/0630  . Postoperative anemia due to acute blood loss 10/12/2013  . 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  Coronary artery disease- 3 V at cath 08/08/13 08/08/2013  . DOE (dyspnea on exertion)   . Acute on chronic respiratory failure ([place]) 07/02/2013  . PAF (paroxysmal atrial fibrillation) ([place]) 07/02/2013  . Acute bronchitis 07/01/2013  . Pleural effusion with elevated pro BNP 07/01/2013  . Pericardial effusion 07/01/2013  . Pericardial effusion with cardiac tamponade- s/p perciocentesis 06/30/13   . ACS (acute coronary syndrome) ([place]) 06/30/2013  . Fatigue 06/30/2013  . Nonspecific abnormal electrocardiogram (ECG) (EKG) 06/30/2013  . COPD with exacerbation ([place]) 06/30/2013  . Abnormal chest x-ray 10/03/2012  . 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 Arthritis   . COPD (chronic obstructive pulmonary disease) ([place])   . CKD (chronic kidney disease), stage I   . Obesity   . Degenerative arthritis of hip 08/12/2012  . Preoperative clearance 07/07/2012  . Hyperlipidemia 03/16/2012  . Tobacco dependence   . Health maintenance examination 09/16/2011  . HTN (hypertension), benign 09/02/2011  . Prostate cancer screening 09/02/2011    Past Surgical History:  Procedure Laterality Date  . APPENDECTOMY  1957  . CARDIAC CATHETERIZATION  08/08/2013  . CATARACT EXTRACTION W/ INTRAOCULAR LENS  IMPLANT, BILATERAL  04/08/2006 & 04/22/2006  . CATARACT EXTRACTION W/ INTRAOCULAR LENS  IMPLANT, BILATERAL Bilateral   . CHOLECYSTECTOMY OPEN  1999  . COLONOSCOPY  10/16/2011   Procedure: COLONOSCOPY;  Surgeon: Inda Castle, MD;  Location: [facility];  Service: Endoscopy;  Laterality: N/A;  . CORONARY ARTERY BYPASS GRAFT N/A 08/14/2013   Procedure: CORONARY ARTERY BYPASS GRAFTING (CABG);  Surgeon: Melrose Nakayama, MD;  Location: [facility];  Service: Open Heart Surgery;  Laterality: N/A;  Times 4   using left internal mammary artery and endoscopically harvested bilateral saphenous vein  . ESOPHAGOGASTRODUODENOSCOPY  02/25/12   Atrophic gastritis with a few erosions--capsule endoscopy planned as of 02/25/12 (Dr.  Deatra Ina).  [name] HARDWARE REMOVAL Right 08/12/2012   Procedure: HARDWARE REMOVAL;  Surgeon: Mcarthur Rossetti, MD;  Location: [facility];  Service: Orthopedics;  Laterality: Right;  . HIP SURGERY Right 1954   Repair of slipped capital femoral epiphysis.  . INTRAOPERATIVE TRANSESOPHAGEAL ECHOCARDIOGRAM N/A 08/14/2013   Normal LV function. Procedure: INTRAOPERATIVE TRANSESOPHAGEAL ECHOCARDIOGRAM;  Surgeon: Melrose Nakayama, MD;  Location: [facility];  Service: Open Heart Surgery;  Laterality: N/A;  . LEFT HEART CATHETERIZATION WITH CORONARY ANGIOGRAM N/A 08/08/2013   Procedure: LEFT HEART CATHETERIZATION WITH CORONARY ANGIOGRAM;  Surgeon: Jettie Booze, MD;  Location: [facility] CATH LAB;  Service: Cardiovascular;  Laterality: N/A;  . PATELLA FRACTURE SURGERY Left ~ 1979   bolt + 3 screws to repair tib plateau fx  . PERICARDIAL TAP N/A 07/01/2013   Procedure: PERICARDIAL TAP;  Surgeon: Jettie Booze, MD;  Location: [facility] CATH LAB;  Service: Cardiovascular;  Laterality: N/A;  . TESTICLE SURGERY  as a child   Undescended testicle brought down into scrotum  . TONSILLECTOMY  1947  . TOTAL HIP ARTHROPLASTY Right 08/12/2012   Procedure: REMOVAL OF OLD PINS  RIGHT HIP AND RIGHT TOTAL HIP ARTHROPLASTY ANTERIOR APPROACH;  Surgeon: Mcarthur Rossetti, MD;  Location: [facility];  Service: Orthopedics;  Laterality: Right;  . TRANSTHORACIC ECHOCARDIOGRAM  10/30/14   Mod LVH, EF 60-65%, normal wall motion, mod mitral regurg, mild PAH       Home Medications    Prior to Admission medications   Medication Sig Start Date End Date Taking? Authorizing Provider  acetaminophen (TYLENOL) 325 MG tablet Take 2 tablets (650 mg total) by mouth every 4 (four) hours as needed for headache or mild pain. 08/09/13   Erlene Quan, PA-C  amLODipine (NORVASC) 5 MG tablet Take 1 tablet (5 mg total) by mouth daily. 07/22/15   McGowen, Adrian Blackwater, MD  Ascorbic Acid (VITAMIN C) 1000 MG tablet Take 1,000 mg by mouth daily.    [provider]  aspirin 81 MG tablet Take 81 mg by mouth at bedtime. Reported on 02/26/2015    [provider]  atorvastatin (LIPITOR) 40 MG tablet TAKE ONE TABLET BY MOUTH EVERY DAY 02/17/16   McGowen, Adrian Blackwater, MD  azithromycin (ZITHROMAX) 250 MG tablet Take 1 tablet once daily for 6 days. 06/02/16   Mannam, Hart Robinsons, MD  budesonide-formoterol (SYMBICORT) 160-4.5 MCG/ACT inhaler Inhale 2 puffs into the lungs 2 (two) times daily. 05/17/15   McGowen, Adrian Blackwater, MD  buPROPion (WELLBUTRIN SR) 150 MG 12 hr tablet TAKE ONE TABLET BY MOUTH TWICE DAILY 12/17/15   McGowen, Adrian Blackwater, MD  Cholecalciferol (VITAMIN D-3) 1000 units CAPS Take 1,000 Units by mouth daily. Reported on 02/26/2015    [provider]  Chromium Picolinate 1000 MCG TABS Take 1,000 mcg by mouth daily. Reported on 02/26/2015    [provider]  citalopram (CELEXA) 40 MG tablet Take 1 tablet (40 mg total) by mouth daily. 07/23/14   McGowen, Adrian Blackwater, MD  diltiazem (CARDIZEM CD) 240 MG 24 hr capsule Take 1 capsule (240 mg total) by mouth daily. 11/27/14   Isaiah Serge, NP  fluticasone (FLONASE) 50 MCG/ACT nasal spray Place 2 sprays into both nostrils daily. 06/04/16   McGowen, Adrian Blackwater, MD  furosemide (LASIX) 40 MG tablet Take 40 mg by mouth daily. 11/11/15   Deloria Lair, NP  gabapentin (NEURONTIN) 100 MG capsule TAKE 2 CAPSULES BY MOUTH 3 TIMES DAILY Patient taking differently: take 2 capsules in the morning and 3 capsules at bedtime 12/02/15   McGowen, Adrian Blackwater, MD  guaiFENesin (MUCINEX) 600 MG 12 hr tablet Take 2 tablets (1,200 mg total) by mouth 2 (two) times daily. 11/02/14   Verlee Monte, MD  Insulin Glargine (TOUJEO SOLOSTAR) 300 UNIT/ML SOPN Inject 70 Units into the skin daily. 04/20/16   Elayne Snare, MD  Insulin Pen Needle 32G X 5 MM MISC Use to inject insulin TID as directed by physician. 04/22/16   Elayne Snare, MD  insulin regular (NOVOLIN R,HUMULIN R) 100 units/mL injection Inject 16 units SQ with BF, 17  units SQ with Lunch, and 18 U SQ with Supper Patient taking differently: 28 units in the am and at lunch and 28 at bedtime 09/27/15   McGowen, Adrian Blackwater, MD  isosorbide mononitrate (IMDUR) 30 MG 24 hr tablet TAKE ONE TABLET BY MOUTH DAILY 02/17/16   McGowen, Adrian Blackwater, MD  Multiple Vitamin (MULTIVITAMIN WITH MINERALS) TABS Take 1 tablet by mouth daily.    [provider]  nitroGLYCERIN (NITROSTAT) 0.4 MG SL tablet Place 1 tablet (0.4 mg total) under the tongue every 5 (five) minutes as needed  for chest pain. 01/31/16   Jettie Booze, MD  pioglitazone (ACTOS) 45 MG tablet Take 45 mg by mouth daily. Reported on 02/26/2015 06/26/13   [provider]  PROAIR HFA 108 (90 Base) MCG/ACT inhaler INHALE 2 PUFFS INTO THE LUNGS EVERY 4 HOURS AS NEEDED FOR WHEEZING ORSHORTNESS OF BREATH 03/18/16   McGowen, Adrian Blackwater, MD  rivaroxaban (XARELTO) 20 MG TABS tablet Take 1 tablet (20 mg total) by mouth daily with supper. 03/19/16   Jettie Booze, MD  Spacer/Aero Chamber Mouthpiece MISC Use as directed with Symbicort and Proair inhaler 05/13/16   McGowen, Adrian Blackwater, MD  SYNJARDY 05-998 MG TABS TAKE ONE TABLET BY MOUTH TWICE DAILY AFTER A MEAL 02/17/16   Elayne Snare, MD  tamsulosin (FLOMAX) 0.4 MG CAPS capsule TAKE 1 CAPSULE BY MOUTH EVERY DAY AFTER SUPPER 05/06/16   McGowen, Adrian Blackwater, MD    Family History Family History  Problem Relation Age of Onset  . Heart disease Father   . Heart attack Father   . CVA Mother   . Hypertension Mother   . Diabetes Paternal Grandmother   . Breast cancer Sister   . Diabetes Maternal Uncle   . Heart disease Maternal Uncle   . Stroke Neg Hx     Social History Social History  Substance Use Topics  . Smoking status: Former Smoker    Packs/day: 0.25    Years: 62.00    Types: Cigarettes    Quit date: 10/23/2015  . Smokeless tobacco: Former Systems developer    Quit date: 06/30/2013  . Alcohol use No     Allergies   Morphine and related; Demerol [meperidine]; and  Starlix [nateglinide]   Review of Systems Review of Systems  Constitutional: Negative for activity change and fever.  HENT: Negative for congestion.   Respiratory: Positive for cough and shortness of breath.   Cardiovascular: Positive for leg swelling. Negative for chest pain and syncope.  Gastrointestinal: Negative for abdominal pain and vomiting.  Neurological: Negative for dizziness and light-headedness.  All other systems reviewed and are negative.    Physical Exam Updated Vital Signs BP 129/60   Pulse (!) 104   Temp 98.2 F (36.8 C) (Oral)   Resp 19   Ht _0  (1.803 m)   Wt 115.2 kg (254 lb)   SpO2 96%   BMI 35.43 kg/m   Physical Exam  Constitutional: He is oriented to person, place, and time. He appears well-nourished.  HENT:  Head: Normocephalic.  Eyes: Conjunctivae and EOM are normal. Right eye exhibits no discharge. Left eye exhibits no discharge.  Cardiovascular:  Irregularly irregular   Pulmonary/Chest: No respiratory distress. He has wheezes. He has rales.  On 3 L, scattered rhonchi rales in posterior chest  Abdominal: Soft. He exhibits no distension. There is no tenderness.  Musculoskeletal: He exhibits edema.  Neurological: He is oriented to person, place, and time.  Skin: Skin is warm and dry. He is not diaphoretic.  Psychiatric: He has a normal mood and affect. His behavior is normal.     ED Treatments / Results  Labs (all labs ordered are listed, but only abnormal results are displayed) Labs Reviewed  CBC WITH DIFFERENTIAL/PLATELET - Abnormal; Notable for the following:       Result Value   MCH 25.8 (*)    RDW 16.4 (*)    All other components within normal limits  PROTIME-INR - Abnormal; Notable for the following:    Prothrombin Time 16.1 (*)  All other components within normal limits  URINALYSIS, ROUTINE W REFLEX MICROSCOPIC - Abnormal; Notable for the following:    Color, Urine STRAW (*)    Glucose, UA >=500 (*)    All other  components within normal limits  I-STAT CG4 LACTIC ACID, ED - Abnormal; Notable for the following:    Lactic Acid, Venous 2.64 (*)    All other components within normal limits  COMPREHENSIVE METABOLIC PANEL  BRAIN NATRIURETIC PEPTIDE  I-STAT TROPOININ, ED    EKG  EKG Interpretation  Date/Time:  Thursday Jun 04 2016 11:21:44 EDT Ventricular Rate:  126 PR Interval:    QRS Duration: 158 QT Interval:  370 QTC Calculation: 535 R Axis:   78 Text Interpretation:  Sinus tachycardia Non-specific intra-ventricular conduction block Abnormal ECG Left bundle branch block different since last tracing.  Confirmed by Zenovia Jarred [phone]) on 06/04/2016 11:38:15 AM       Radiology Dg Chest Portable 1 View  Result Date: 06/04/2016 CLINICAL DATA:  Episode of shortness of breath 2-3 weeks ago without chest pain. One year history of intermittently productive cough. Discontinued smoking 8 months ago. History of diabetes and COPD. 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 EXAM: PORTABLE CHEST 1 VIEW COMPARISON:  CT scan of the chest of Jun 02, 2016 and chest x-ray of May 18, 2016. FINDINGS: The lungs are adequately inflated. The interstitial markings remain increased air and are slightly more conspicuous overall today. The cardiac silhouette is enlarged today in the pulmonary vascularity is more engorged. The patient has undergone previous CABG. There is calcification in the wall of the aortic arch. There are calcified pleural plaques bilaterally. The observed bony thorax exhibits no acute abnormality. IMPRESSION: Mild interval worsening in the appearance of the pulmonary interstitium which likely reflects CHF superimposed on known chronic lung disease. There is no alveolar pneumonia. Electronically Signed   By: David  Martinique M.D.   On: 06/04/2016 12:51    Procedures Procedures (including critical care time)  Medications Ordered in ED Medications  ipratropium-albuterol (DUONEB) 0.5-2.5 (3) MG/3ML  nebulizer solution 3 mL (3 mLs Nebulization Given 06/04/16 1233)  methylPREDNISolone sodium succinate (SOLU-MEDROL) 125 mg/2 mL injection 125 mg (125 mg Intravenous Given 06/04/16 1234)     Initial Impression / Assessment and Plan / ED Course  I have reviewed the triage vital signs and the nursing notes.  Pertinent labs & imaging results that were available during my care of the patient were reviewed by me and considered in my medical decision making (see chart for details).    Patient is a very pleasant 76 year old male on home O2 for chronic COPD. Past history significant for CABG, CHF. Patient's presenting with worsening shortness of breath over the last week. Patient was seen as an outpatient this morning and sent here for further evaluation. Has been seen 3 times last week for increasing shortness of breath, CT showed bronchitis and was initiated on a Z-Pak yesterday.  Unclear if this is worsening failure, or viral bronchitis.  Patient had CT done yesterday. Unfortunately was not done as look for pulmonary embolism. So we'll send a d-dimer today. Patient does have tachycardia and shortness of breath which could be very easily attriubted o increasing failure on top of bronchitis. Patient has noticed increased edema bilateral lower extremity. Could also consider pericardial effusion as reason for increasing SOB on exertions.    Will admit for continued fruther workup of his increasing SOB.   Patient is also in A. fib today. We'll give metoprolol  to help with rate.    Final Clinical Impressions(s) / ED Diagnoses   Final diagnoses:  None    New Prescriptions New Prescriptions   No medications on file     Macarthur Critchley, MD 06/05/16 1043

## 2016-06-04 NOTE — Progress Notes (Signed)
OFFICE VISIT  06/04/2016   CC:  Chief Complaint  Patient presents with  . Follow-up    CHF and Depression   HPI:    Patient is a 76 y.o. Caucasian male who presents for 2 week f/u chronic diastolic CHF. He was coming off a period of volume overload and increased diuresis when I saw him last.  CXR and labs stable at that time. Resumed lasix 36m qd dosing. He brought up persisting depression at end of last visit.  Note, on recent chest CT (hx of asbestos exposure, also f/u pulm nodule) he had changes c/w bronchopneumonia so his pulm started him on Z pack 06/02/16 (2 days ago).  Last 2 weeks having signif worse DOE, increasing frequency of oxygen requirement (up to 2.5L).  Lowest oxygen reading has been 81 OFF oxygen.  Has chronic, stable SOB at rest.  Denies CP, palpitations, or racing heart or dizziness.  Has still been on total dosing of 860mlasix daily since I last saw him.  He has not noticed any signif swelling in legs.  Some coughing that pt says is not new.  No fever. Appetite/PO intake is down.  Drinks 4-5 bottles of water per day.  Past Medical History:  Diagnosis Date  . Adenomatous colon polyp 10/16/2011   Repeat 2018  . Arthritis     Hips, R>L & KNEES  . CAD, multiple vessel    3V CAD cath 08/08/13----CABG done shortly after.  . 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 pylori neg, no dysplasia or malignancy.  . Chronic diastolic heart failure (HCSutton-Alpine2016  . Chronic renal insufficiency, stage II (mild)   . COPD (chronic obstructive pulmonary disease) (HCFidelity   GOLD II.  Spirometry  2004 borderline obstruction; 2015 mod obst: noncompliant with bid symbicort so pulmonologist switched him to a once daily inhaler: Trelegy ellipta 12/2015.  . Diabetic nephropathy (HCMar-Mac   Elevated urine microalb/cr 03/2011  . DM type 2 (diabetes mellitus, type 2) (HCNorth Haven   Poor control on max oral meds--pt eventually agreed to insulin therapy.  As of 2017 his  DM is being managed by Dr. KuDwyane Deen endo.  . DOE (dyspnea on exertion)    COPD + chronic diastolic HF  . Erectile dysfunction    Normal testosterone  . Hearing loss of both ears 2016   Hearing aids  . Hyperlipidemia   . Hyperplastic colon polyp 2001  . Hypertension   . Iron deficiency anemia 2014   03/2012 capsule endoscopy showed 2 AVMs--likely responsible for his IDA--lifetime iron supp recommended + q6m26moCs.  . Macular degeneration, dry    Mild, bilat (Optometrist, [place] [employer] [place]. Obesity   . Open toe wound 11/2015   Wound care clinic appt made and then canceled when toe improved.  . Other and unspecified angina pectoris   . PAD (peripheral artery disease) (HCCSouth Zanesville2/2018   Abnormal ABI's and waveforms: LE arterial duplex ordered for f/u as per cardiologist's recommendation.  Vasc eval by Dr. CheBridgett Larssonmpression was minimal PAD, recommended maximize med mgmt.  . PMarland KitchenF (paroxysmal atrial fibrillation) (HCCGuntersville0/2016   xarelto  . Pericardial effusion with cardiac tamponade 6//29/15   pericardiocentesis was done,  Infectious/inflamm (cytology showed NO MALIGNANT CELLS)  . Pleural plaque    asbestos exposure x 2 yrs: Dr. ManVaughan Brownerdered CT chest for further eval on 02/2016  . Pneumonia   . Tobacco dependence in  remission    100+ pack-yr hx: quit after CABG    Past Surgical History:  Procedure Laterality Date  . APPENDECTOMY  1957  . CARDIAC CATHETERIZATION  08/08/2013  . CATARACT EXTRACTION W/ INTRAOCULAR LENS  IMPLANT, BILATERAL  04/08/2006 & 04/22/2006  . CATARACT EXTRACTION W/ INTRAOCULAR LENS  IMPLANT, BILATERAL Bilateral   . CHOLECYSTECTOMY OPEN  1999  . COLONOSCOPY  10/16/2011   Procedure: COLONOSCOPY;  Surgeon: Inda Castle, MD;  Location: [facility];  Service: Endoscopy;  Laterality: N/A;  . CORONARY ARTERY BYPASS GRAFT N/A 08/14/2013   Procedure: CORONARY ARTERY BYPASS GRAFTING (CABG);  Surgeon: Melrose Nakayama, MD;  Location: [facility];  Service: Open Heart Surgery;  Laterality: N/A;  Times 4   using left internal mammary artery and endoscopically harvested bilateral saphenous vein  . ESOPHAGOGASTRODUODENOSCOPY  02/25/12   Atrophic gastritis with a few erosions--capsule endoscopy planned as of 02/25/12 (Dr. Deatra Ina).  [name] HARDWARE REMOVAL Right 08/12/2012   Procedure: HARDWARE REMOVAL;  Surgeon: Mcarthur Rossetti, MD;  Location: [facility];  Service: Orthopedics;  Laterality: Right;  . HIP SURGERY Right 1954   Repair of slipped capital femoral epiphysis.  . INTRAOPERATIVE TRANSESOPHAGEAL ECHOCARDIOGRAM N/A 08/14/2013   Normal LV function. Procedure: INTRAOPERATIVE TRANSESOPHAGEAL ECHOCARDIOGRAM;  Surgeon: Melrose Nakayama, MD;  Location: [facility];  Service: Open Heart Surgery;  Laterality: N/A;  . LEFT HEART CATHETERIZATION WITH CORONARY ANGIOGRAM N/A 08/08/2013   Procedure: LEFT HEART CATHETERIZATION WITH CORONARY ANGIOGRAM;  Surgeon: Jettie Booze, MD;  Location[facility] CATH LAB;  Service: Cardiovascular;  Laterality: N/A;  . PATELLA FRACTURE SURGERY Left ~ 1979   bolt + 3 screws to repair tib plateau fx  . PERICARDIAL TAP N/A 07/01/2013   Procedure: PERICARDIAL TAP;  Surgeon: Jettie Booze, MD;  Location: [facility] CATH LAB;  Service: Cardiovascular;  Laterality: N/A;  . TESTICLE SURGERY  as a child   Undescended testicle brought down into scrotum  . TONSILLECTOMY  1947  . TOTAL HIP ARTHROPLASTY Right 08/12/2012   Procedure: REMOVAL OF OLD PINS RIGHT HIP AND RIGHT TOTAL HIP ARTHROPLASTY ANTERIOR APPROACH;  Surgeon: Mcarthur Rossetti, MD;  Location: [facility];  Service: Orthopedics;  Laterality: Right;  . TRANSTHORACIC ECHOCARDIOGRAM  10/30/14   Mod LVH, EF 60-65%, normal wall motion, mod mitral regurg, mild PAH    Outpatient Medications Prior to Visit  Medication Sig Dispense Refill  . acetaminophen (TYLENOL) 325 MG tablet Take 2 tablets (650 mg total) by mouth every 4 (four) hours as needed for headache or mild pain.    [name]  amLODipine (NORVASC) 5 MG tablet Take 1 tablet (5 mg total) by mouth daily. 30 tablet 11  . Ascorbic Acid (VITAMIN C) 1000 MG tablet Take 1,000 mg by mouth daily.    [name] aspirin 81 MG tablet Take 81 mg by mouth at bedtime. Reported on 02/26/2015    . atorvastatin (LIPITOR) 40 MG tablet TAKE ONE TABLET BY MOUTH EVERY DAY 30 tablet 6  . azithromycin (ZITHROMAX) 250 MG tablet Take 1 tablet once daily for 6 days. 6 each 0  . budesonide-formoterol (SYMBICORT) 160-4.5 MCG/ACT inhaler Inhale 2 puffs into the lungs 2 (two) times daily. 1 Inhaler 12  . buPROPion (WELLBUTRIN SR) 150 MG 12 hr tablet TAKE ONE TABLET BY MOUTH TWICE DAILY 60 tablet 6  . Cholecalciferol (VITAMIN D-3) 1000 units CAPS Take 1,000 Units by mouth daily. Reported on 02/26/2015    . Chromium Picolinate 1000 MCG TABS Take 1,000 mcg by mouth daily.  Reported on 02/26/2015    . citalopram (CELEXA) 40 MG tablet Take 1 tablet (40 mg total) by mouth daily. 90 tablet 3  . diltiazem (CARDIZEM CD) 240 MG 24 hr capsule Take 1 capsule (240 mg total) by mouth daily. 30 capsule 1  . furosemide (LASIX) 40 MG tablet Take 40 mg by mouth daily.    [name] gabapentin (NEURONTIN) 100 MG capsule TAKE 2 CAPSULES BY MOUTH 3 TIMES DAILY (Patient taking differently: take 2 capsules in the morning and 3 capsules at bedtime) 180 capsule 6  . guaiFENesin (MUCINEX) 600 MG 12 hr tablet Take 2 tablets (1,200 mg total) by mouth 2 (two) times daily. 30 tablet 0  . Insulin Glargine (TOUJEO SOLOSTAR) 300 UNIT/ML SOPN Inject 70 Units into the skin daily. 6 pen 1  . Insulin Pen Needle 32G X 5 MM MISC Use to inject insulin TID as directed by physician. 100 each 5  . insulin regular (NOVOLIN R,HUMULIN R) 100 units/mL injection Inject 16 units SQ with BF, 17 units SQ with Lunch, and 18 U SQ with Supper (Patient taking differently: 28 units in the am and at lunch and 28 at bedtime) 1 vial 6  . isosorbide mononitrate (IMDUR) 30 MG 24 hr tablet TAKE ONE TABLET BY MOUTH DAILY 90 tablet 1  .  Multiple Vitamin (MULTIVITAMIN WITH MINERALS) TABS Take 1 tablet by mouth daily.    . nitroGLYCERIN (NITROSTAT) 0.4 MG SL tablet Place 1 tablet (0.4 mg total) under the tongue every 5 (five) minutes as needed for chest pain. 25 tablet 3  . pioglitazone (ACTOS) 45 MG tablet Take 45 mg by mouth daily. Reported on 02/26/2015    . PROAIR HFA 108 (90 Base) MCG/ACT inhaler INHALE 2 PUFFS INTO THE LUNGS EVERY 4 HOURS AS NEEDED FOR WHEEZING ORSHORTNESS OF BREATH 8.5 g 1  . rivaroxaban (XARELTO) 20 MG TABS tablet Take 1 tablet (20 mg total) by mouth daily with supper. 90 tablet 1  . Spacer/Aero Chamber [employer] Use as directed with Symbicort and Proair inhaler 1 each 1  . SYNJARDY 05-998 MG TABS TAKE ONE TABLET BY MOUTH TWICE DAILY AFTER A MEAL 60 tablet 3  . tamsulosin (FLOMAX) 0.4 MG CAPS capsule TAKE 1 CAPSULE BY MOUTH EVERY DAY AFTER SUPPER 90 capsule 1  . fluticasone (FLONASE) 50 MCG/ACT nasal spray Place 2 sprays into both nostrils daily. 16 g 1   No facility-administered medications prior to visit.     Allergies  Allergen Reactions  . Morphine And Related Other (See Comments)    Drenched with perspiration  . Demerol [Meperidine] Nausea Only  . Starlix [Nateglinide] Other (See Comments)    gassy    ROS As per HPI  PE: Blood pressure 127/65, pulse (!) 138, temperature 98 F (36.7 C), temperature source Oral, resp. rate 20, height _0  (1.803 m), weight 254 lb 4 oz (115.3 kg), SpO2 93 %. Gen: Alert, well appearing.  Patient is oriented to person, place, time, and situation. Very labored resps with ambulation and for about 3 min after sitting down. Oxygen down to 88% on 2.5L oxygen after pt walked into room.  Up to 93-94% after 2 min rest. EUM:PNTI: no injection, icteris, swelling, or exudate.  EOMI, PERRLA. Mouth: lips without lesion/swelling.  Oral mucosa pink and moist. Oropharynx without erythema, exudate, or swelling.  CV: Regular, tachy to 140s, no m/r. LUNGS: diminished BS  throughout lungs, esp in bases.  Trace insp crackles bibasilar L>>R and in L axillary region. At  rest he does not have labored resps. EXT: no clubbing or cyanosis.  1+ pitting edema R LE, 2+ L LE.   LABS:  None today.    Chemistry      Component Value Date/Time   NA 142 05/18/2016 0845   K 4.5 05/18/2016 0845   CL 106 05/18/2016 0845   CO2 26 05/18/2016 0845   BUN 23 05/18/2016 0845   CREATININE 0.94 05/18/2016 0845   CREATININE 1.01 10/31/2015 1229      Component Value Date/Time   CALCIUM 9.2 05/18/2016 0845   ALKPHOS 107 02/19/2016 0835   AST 18 02/19/2016 0835   ALT 24 02/19/2016 0835   BILITOT 0.6 02/19/2016 0835       IMPRESSION AND PLAN:  Decompensated diastolic HF, with superimposed bronchopneumonia. Hypoxia with ambulation, + resting tachycardia. Recommended transport to ED for further evaluation and possible admission and pt and wife were agreeable to this plan.  An After Visit Summary was printed and given to the patient.  FOLLOW UP: Return to be determined (after ED/hosp).  Signed:  Crissie Sickles, MD           06/04/2016

## 2016-06-04 NOTE — ED Triage Notes (Signed)
Patient comes in per [facility] with sob. Fm [place]. Labored breathing, SOB over last 2 weeks. Patient has no complaints. br tx given in route. 90% 3L O2 chronic for copd. ekg LBBB. No hx. 18 LA. Hx copd, cabg, DM, afib, HTN, CHF, renal disease. Ems v/s 130/70, 130 HR, 93% 3L, 213 cbg.

## 2016-06-04 NOTE — ED Notes (Signed)
Xray at bedside.

## 2016-06-04 NOTE — H&P (Signed)
History and Physical    Joshua [name]:[id number] DOB: 1940-12-25 DOA: 06/04/2016  PCP: Tammi Sou, MD Patient coming from: home  Chief Complaint: worsening shortness of breath/cap  HPI: Joshua [name] is a very pleasant 76 y.o. male with medical history significant chronic diastolic heart failure, COPD on home oxygen, COPD, history of asbestos exposure, hypertension, hyperlipidemia, diabetes presents to the emergency department from his primary care provider's office chief complaint gradual persistent worsening of shortness of breath and lower extremity edema. Initial evaluation reveals respiratory distress likely related to COPD exacerbation in the setting of community-acquired pneumonia and mild acute on chronic heart failure.  Information is obtained from the patient, the family his at the bedside, the chart. Patient's been experiencing gradual worsening shortness of breath for the last 2 weeks. His diagnosis was acute on chronic diastolic heart failure and his Lasix was increased. He had increased oxygen demand as well and was stated that his when necessary oxygen was prescribed as continuous. When he did not improve CT was obtained 2 days ago revealed a former pulmonary nodule absent, changes consistent with bronchopneumonia and he was given a Z-Pak. He denies headache dizziness chest pain palpitation abdominal pain nausea vomiting. He denies fever chills recent travel or sick contacts. He denies diarrhea constipation melena bright red blood per rectum. He denies dysuria hematuria frequency or urgency. He does have intermittent wet sounding but nonproductive cough. He went to see his primary care provider today who opined hypoxia with ambulation with resting tachycardia recommended transport to the ED.  ED Course: In the emergency department he is tachycardic hemodynamically stable tachypnea no hypoxia creased work of breathing. He is provided with 40 mg of Lasix IV and a breathing  treatment. He's also provided with Solu-Medrol. At the time of my interview he reports much improved respiratory effort and family confirms that he is very close to baseline  Review of Systems: As per HPI otherwise all other systems reviewed and are negative.   Ambulatory Status: He relates independently lives at home with his wife is independent with ADLs  Past Medical History:  Diagnosis Date  . Adenomatous colon polyp 10/16/2011   Repeat 2018  . Arthritis     Hips, R>L & KNEES  . CAD, multiple vessel    3V CAD cath 08/08/13----CABG done shortly after.  . 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 pylori neg, no dysplasia or malignancy.  . Chronic diastolic heart failure ([place]) 2016  . Chronic renal insufficiency, stage II (mild)   . COPD (chronic obstructive pulmonary disease) ([place])    GOLD II.  Spirometry  2004 borderline obstruction; 2015 mod obst: noncompliant with bid symbicort so pulmonologist switched him to a once daily inhaler: Trelegy ellipta 12/2015.  . Diabetic nephropathy ([place])    Elevated urine microalb/cr 03/2011  . DM type 2 (diabetes mellitus, type 2) ([place])    Poor control on max oral meds--pt eventually agreed to insulin therapy.  As of 2017 his DM is being managed by Dr. Dwyane Dee in endo.  . DOE (dyspnea on exertion)    COPD + chronic diastolic HF  . Erectile dysfunction    Normal testosterone  . Hearing loss of both ears 2016   Hearing aids  . Hyperlipidemia   . Hyperplastic colon polyp 2001  . Hypertension   . Iron deficiency anemia 2014   03/2012 capsule endoscopy showed 2 AVMs--likely responsible for his IDA--lifetime iron supp recommended + q75moCBCs.  .[name]  Macular degeneration, dry    Mild, bilat (Optometrist, Mayford Knife at [facility] [place] in [place], [place])  . Obesity   . Open toe wound 11/2015   Wound care clinic appt made and then canceled when toe improved.  . Other and unspecified angina pectoris   . PAD  (peripheral artery disease) ([place]) 02/2016   Abnormal ABI's and waveforms: LE arterial duplex ordered for f/u as per cardiologist's recommendation.  Vasc eval by Dr. Bridgett Larsson: impression was minimal PAD, recommended maximize med mgmt.  [name] PAF (paroxysmal atrial fibrillation) ([place]) 10/2014   xarelto  . Pericardial effusion with cardiac tamponade 6//29/15   pericardiocentesis was done,  Infectious/inflamm (cytology showed NO MALIGNANT CELLS)  . Pleural plaque    asbestos exposure x 2 yrs: Dr. Vaughan Browner ordered CT chest for further eval on 02/2016  . Pneumonia   . Tobacco dependence in remission    100+ pack-yr hx: quit after CABG    Past Surgical History:  Procedure Laterality Date  . APPENDECTOMY  1957  . CARDIAC CATHETERIZATION  08/08/2013  . CATARACT EXTRACTION W/ INTRAOCULAR LENS  IMPLANT, BILATERAL  04/08/2006 & 04/22/2006  . CATARACT EXTRACTION W/ INTRAOCULAR LENS  IMPLANT, BILATERAL Bilateral   . CHOLECYSTECTOMY OPEN  1999  . COLONOSCOPY  10/16/2011   Procedure: COLONOSCOPY;  Surgeon: Inda Castle, MD;  Location: [facility];  Service: Endoscopy;  Laterality: N/A;  . CORONARY ARTERY BYPASS GRAFT N/A 08/14/2013   Procedure: CORONARY ARTERY BYPASS GRAFTING (CABG);  Surgeon: Melrose Nakayama, MD;  Location: [facility];  Service: Open Heart Surgery;  Laterality: N/A;  Times 4   using left internal mammary artery and endoscopically harvested bilateral saphenous vein  . ESOPHAGOGASTRODUODENOSCOPY  02/25/12   Atrophic gastritis with a few erosions--capsule endoscopy planned as of 02/25/12 (Dr. Deatra Ina).  [name] HARDWARE REMOVAL Right 08/12/2012   Procedure: HARDWARE REMOVAL;  Surgeon: Mcarthur Rossetti, MD;  Location: [facility];  Service: Orthopedics;  Laterality: Right;  . HIP SURGERY Right 1954   Repair of slipped capital femoral epiphysis.  . INTRAOPERATIVE TRANSESOPHAGEAL ECHOCARDIOGRAM N/A 08/14/2013   Normal LV function. Procedure: INTRAOPERATIVE TRANSESOPHAGEAL ECHOCARDIOGRAM;  Surgeon: Melrose Nakayama, MD;  Location: [facility];  Service: Open Heart Surgery;  Laterality: N/A;  . LEFT HEART CATHETERIZATION WITH CORONARY ANGIOGRAM N/A 08/08/2013   Procedure: LEFT HEART CATHETERIZATION WITH CORONARY ANGIOGRAM;  Surgeon: Jettie Booze, MD;  Location: [facility] CATH LAB;  Service: Cardiovascular;  Laterality: N/A;  . PATELLA FRACTURE SURGERY Left ~ 1979   bolt + 3 screws to repair tib plateau fx  . PERICARDIAL TAP N/A 07/01/2013   Procedure: PERICARDIAL TAP;  Surgeon: Jettie Booze, MD;  Location: [facility] CATH LAB;  Service: Cardiovascular;  Laterality: N/A;  . TESTICLE SURGERY  as a child   Undescended testicle brought down into scrotum  . TONSILLECTOMY  1947  . TOTAL HIP ARTHROPLASTY Right 08/12/2012   Procedure: REMOVAL OF OLD PINS RIGHT HIP AND RIGHT TOTAL HIP ARTHROPLASTY ANTERIOR APPROACH;  Surgeon: Mcarthur Rossetti, MD;  Location: [facility];  Service: Orthopedics;  Laterality: Right;  . TRANSTHORACIC ECHOCARDIOGRAM  10/30/14   Mod LVH, EF 60-65%, normal wall motion, mod mitral regurg, mild PAH    Social History   Social History  . Marital status: Married    Spouse name: [name]  . Number of children: 4  . Years of education: N/A   Occupational History  . bus operator    Social History Main Topics  . Smoking status: Former  Smoker    Packs/day: 0.25    Years: 62.00    Types: Cigarettes    Quit date: 10/23/2015  . Smokeless tobacco: Former Systems developer    Quit date: 06/30/2013  . Alcohol use No  . Drug use: No  . Sexual activity: No   Other Topics Concern  . Not on file   Social History Narrative   Married, 4 children.   Formerly a Medical illustrator.   Local transportation bus driver.  Level of education: HS.     +tobacco--lifelong (still smoking as of 08/2011).  No alcohol or drugs.   No exercise.  +excessive caffeine.    Allergies  Allergen Reactions  . Morphine And Related Other (See Comments)    Drenched with perspiration  . Demerol [Meperidine]  Nausea Only  . Starlix [Nateglinide] Other (See Comments)    gassy    Family History  Problem Relation Age of Onset  . Heart disease Father   . Heart attack Father   . CVA Mother   . Hypertension Mother   . Diabetes Paternal Grandmother   . Breast cancer Sister   . Diabetes Maternal Uncle   . Heart disease Maternal Uncle   . Stroke Neg Hx     Prior to Admission medications   Medication Sig Start Date End Date Taking? Authorizing Provider  acetaminophen (TYLENOL) 325 MG tablet Take 2 tablets (650 mg total) by mouth every 4 (four) hours as needed for headache or mild pain. 08/09/13   Erlene Quan, PA-C  amLODipine (NORVASC) 5 MG tablet Take 1 tablet (5 mg total) by mouth daily. 07/22/15   McGowen, Adrian Blackwater, MD  Ascorbic Acid (VITAMIN C) 1000 MG tablet Take 1,000 mg by mouth daily.    [provider]  aspirin 81 MG tablet Take 81 mg by mouth at bedtime. Reported on 02/26/2015    [provider]  atorvastatin (LIPITOR) 40 MG tablet TAKE ONE TABLET BY MOUTH EVERY DAY 02/17/16   McGowen, Adrian Blackwater, MD  azithromycin (ZITHROMAX) 250 MG tablet Take 1 tablet once daily for 6 days. 06/02/16   Mannam, Hart Robinsons, MD  budesonide-formoterol (SYMBICORT) 160-4.5 MCG/ACT inhaler Inhale 2 puffs into the lungs 2 (two) times daily. 05/17/15   McGowen, Adrian Blackwater, MD  buPROPion (WELLBUTRIN SR) 150 MG 12 hr tablet TAKE ONE TABLET BY MOUTH TWICE DAILY 12/17/15   McGowen, Adrian Blackwater, MD  Cholecalciferol (VITAMIN D-3) 1000 units CAPS Take 1,000 Units by mouth daily. Reported on 02/26/2015    [provider]  Chromium Picolinate 1000 MCG TABS Take 1,000 mcg by mouth daily. Reported on 02/26/2015    [provider]  citalopram (CELEXA) 40 MG tablet Take 1 tablet (40 mg total) by mouth daily. 07/23/14   McGowen, Adrian Blackwater, MD  diltiazem (CARDIZEM CD) 240 MG 24 hr capsule Take 1 capsule (240 mg total) by mouth daily. 11/27/14   Isaiah Serge, NP  fluticasone (FLONASE) 50 MCG/ACT nasal spray  Place 2 sprays into both nostrils daily. 06/04/16   McGowen, Adrian Blackwater, MD  furosemide (LASIX) 40 MG tablet Take 40 mg by mouth daily. 11/11/15   Deloria Lair, NP  gabapentin (NEURONTIN) 100 MG capsule TAKE 2 CAPSULES BY MOUTH 3 TIMES DAILY Patient taking differently: take 2 capsules in the morning and 3 capsules at bedtime 12/02/15   McGowen, Adrian Blackwater, MD  guaiFENesin (MUCINEX) 600 MG 12 hr tablet Take 2 tablets (1,200 mg total) by mouth 2 (two) times daily. 11/02/14   Elmahi,  Mutaz, MD  Insulin Glargine (TOUJEO SOLOSTAR) 300 UNIT/ML SOPN Inject 70 Units into the skin daily. 04/20/16   Elayne Snare, MD  Insulin Pen Needle 32G X 5 MM MISC Use to inject insulin TID as directed by physician. 04/22/16   Elayne Snare, MD  insulin regular (NOVOLIN R,HUMULIN R) 100 units/mL injection Inject 16 units SQ with BF, 17 units SQ with Lunch, and 18 U SQ with Supper Patient taking differently: 28 units in the am and at lunch and 28 at bedtime 09/27/15   McGowen, Adrian Blackwater, MD  isosorbide mononitrate (IMDUR) 30 MG 24 hr tablet TAKE ONE TABLET BY MOUTH DAILY 02/17/16   McGowen, Adrian Blackwater, MD  Multiple Vitamin (MULTIVITAMIN WITH MINERALS) TABS Take 1 tablet by mouth daily.    [provider]  nitroGLYCERIN (NITROSTAT) 0.4 MG SL tablet Place 1 tablet (0.4 mg total) under the tongue every 5 (five) minutes as needed for chest pain. 01/31/16   Jettie Booze, MD  pioglitazone (ACTOS) 45 MG tablet Take 45 mg by mouth daily. Reported on 02/26/2015 06/26/13   [provider]  PROAIR HFA 108 (90 Base) MCG/ACT inhaler INHALE 2 PUFFS INTO THE LUNGS EVERY 4 HOURS AS NEEDED FOR WHEEZING ORSHORTNESS OF BREATH 03/18/16   McGowen, Adrian Blackwater, MD  rivaroxaban (XARELTO) 20 MG TABS tablet Take 1 tablet (20 mg total) by mouth daily with supper. 03/19/16   Jettie Booze, MD  Spacer/Aero Chamber Mouthpiece MISC Use as directed with Symbicort and Proair inhaler 05/13/16   McGowen, Adrian Blackwater, MD  SYNJARDY 05-998 MG TABS TAKE  ONE TABLET BY MOUTH TWICE DAILY AFTER A MEAL 02/17/16   Elayne Snare, MD  tamsulosin (FLOMAX) 0.4 MG CAPS capsule TAKE 1 CAPSULE BY MOUTH EVERY DAY AFTER SUPPER 05/06/16   Tammi Sou, MD    Physical Exam: Vitals:   06/04/16 1330 06/04/16 1345 06/04/16 1400 06/04/16 1430  BP: 133/77 120/69 119/76 119/74  Pulse: 68 (!) 132 (!) 141 (!) 135  Resp: (!) 27 (!) 23 (!) 26 (!) 28  Temp:      TempSrc:      SpO2: 95% 93% 93% 92%  Weight:      Height:         General:  Sitting up in bed only mild increased work of breathing no acute distress watching television Eyes:  PERRL, EOMI, normal lids, iris ENT:  grossly normal hearing, lips & tongue, mucous membranes of his mouth are pink slightly dry Neck:  no LAD, masses or thyromegaly Cardiovascular:  Tachycardic but regular, heart sounds are somewhat distant, no m/r/g. Trace lower shin edema on right 1+ lower extremity edema on left.  Respiratory:  Mild increased work of breathing with conversation breath sounds somewhat distant I hear diffuse end-expiratory wheezes as well as bilateral fine crackles in bases no rhonchi Abdomen: Obese slightly firm positive bowel sounds no guarding or rebounding Skin:  no rash or induration seen on limited exam Musculoskeletal:  grossly normal tone BUE/BLE, good ROM, no bony abnormality Psychiatric:  grossly normal mood and affect, speech fluent and appropriate, AOx3 Neurologic:  CN 2-12 grossly intact, moves all extremities in coordinated fashion, sensation intact. Speech clear facial symmetry  Labs on Admission: I have personally reviewed following labs and imaging studies  CBC:  Recent Labs Lab 06/04/16 1212  WBC 9.6  NEUTROABS 7.5  HGB 13.1  HCT 42.6  MCV 83.9  PLT PLATELET CLUMPS NOTED ON SMEAR, COUNT APPEARS ADEQUATE   Basic Metabolic Panel:  Recent Labs Lab 06/04/16 1212  NA 140  K 3.5  CL 103  CO2 24  GLUCOSE 160*  BUN 23*  CREATININE 1.07  CALCIUM 9.1   GFR: Estimated Creatinine  Clearance: 74.7 mL/min (by C-G formula based on SCr of 1.07 mg/dL). Liver Function Tests:  Recent Labs Lab 06/04/16 1212  AST 25  ALT 18  ALKPHOS 81  BILITOT 1.0  PROT 6.3*  ALBUMIN 3.4*   No results for input(s): LIPASE, AMYLASE in the last 168 hours. No results for input(s): AMMONIA in the last 168 hours. Coagulation Profile:  Recent Labs Lab 06/04/16 1212  INR 1.28   Cardiac Enzymes: No results for input(s): CKTOTAL, CKMB, CKMBINDEX, TROPONINI in the last 168 hours. BNP (last 3 results) No results for input(s): PROBNP in the last 8760 hours. HbA1C: No results for input(s): HGBA1C in the last 72 hours. CBG: No results for input(s): GLUCAP in the last 168 hours. Lipid Profile: No results for input(s): CHOL, HDL, LDLCALC, TRIG, CHOLHDL, LDLDIRECT in the last 72 hours. Thyroid Function Tests: No results for input(s): TSH, T4TOTAL, FREET4, T3FREE, THYROIDAB in the last 72 hours. Anemia Panel: No results for input(s): VITAMINB12, FOLATE, FERRITIN, TIBC, IRON, RETICCTPCT in the last 72 hours. Urine analysis:    Component Value Date/Time   COLORURINE STRAW (A) 06/04/2016 1203   APPEARANCEUR CLEAR 06/04/2016 1203   LABSPEC 1.009 06/04/2016 1203   PHURINE 5.0 06/04/2016 1203   GLUCOSEU >=500 (A) 06/04/2016 1203   HGBUR NEGATIVE 06/04/2016 [place] 06/04/2016 [place] 06/04/2016 1203   PROTEINUR NEGATIVE 06/04/2016 1203   UROBILINOGEN 0.2 08/11/2013 1624   NITRITE NEGATIVE 06/04/2016 1203   LEUKOCYTESUR NEGATIVE 06/04/2016 1203    Creatinine Clearance: Estimated Creatinine Clearance: 74.7 mL/min (by C-G formula based on SCr of 1.07 mg/dL).  Sepsis Labs: _0 (procalcitonin:4,lacticidven:4) )No results found for this or any previous visit (from the past 240 hour(s)).   Radiological Exams on Admission: Dg Chest Portable 1 View  Result Date: 06/04/2016 CLINICAL DATA:  Episode of shortness of breath 2-3 weeks ago without  chest pain. One year history of intermittently productive cough. Discontinued smoking 8 months ago. History of diabetes and COPD. 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 EXAM: PORTABLE CHEST 1 VIEW COMPARISON:  CT scan of the chest of Jun 02, 2016 and chest x-ray of May 18, 2016. FINDINGS: The lungs are adequately inflated. The interstitial markings remain increased air and are slightly more conspicuous overall today. The cardiac silhouette is enlarged today in the pulmonary vascularity is more engorged. The patient has undergone previous CABG. There is calcification in the wall of the aortic arch. There are calcified pleural plaques bilaterally. The observed bony thorax exhibits no acute abnormality. IMPRESSION: Mild interval worsening in the appearance of the pulmonary interstitium which likely reflects CHF superimposed on known chronic lung disease. There is no alveolar pneumonia. Electronically Signed   By: David  Martinique M.D.   On: 06/04/2016 12:51    EKG: Independently reviewed. Sinus tachycardia Non-specific intra-ventricular conduction block Abnormal ECG Left bundle branch block different since last tracing.  Assessment/Plan Principal Problem:   Respiratory distress Active Problems:   HTN (hypertension), benign   COPD (chronic obstructive pulmonary disease) (HCC)   CKD (chronic kidney disease), stage I   Obesity   PAF (paroxysmal atrial fibrillation) ([facility])   Coronary artery disease- 3 V at cath 08/08/13   Diabetes mellitus with complication (HCC)   COPD exacerbation ([facility])   Chronic diastolic CHF (congestive  heart failure) (HCC)   Acute respiratory distress   Bronchopneumonia   Tachycardia   #1. Respiratory distress likely related to COPD exacerbation in the setting of bronchopneumonia and mild diastolic acute on chronic O2. Increased oxygen demand  CT of the chest 2 days ago reveals consolidation consistent with bronchopneumonia. Chest x-ray today assistant with worsening CHF in the  setting of known chronic lung disease. Audible wheezing. Fine crackles worsening lower extremity edema. BNP 264. Lactic acid within the limits of normal. No leukocytosis afebrile hemodynamically stable.  He is provided with DuoNeb Solu-Medrol and IV Lasix in the emergency department. -Admit to telemetry -Scheduled nebulizers -Solu-Medrol -Flutter valve -Continue IV Lasix -Antibiotics per protocol -Follow blood culture -Sputum culture as able -Monitor intake and output -Obtain daily weights -if no improvement consider CT angio chest. Low suspicion for PE  #2. COPD with exacerbation. Patient just recently put on oxygen 24/ 7. Until 2 weeks ago he was on oxygen at night only. Has been seen by pulmonology in the past for surveillance of pulmonary nodule. Chart review indicates pulmonary function tests in 2015 showed moderate obstruction. Medications include Symbicort -See #1 -Patient and family convey current respiratory effort. Close to baseline  #3. Bronchopneumonia. CT 2 days ago as noted. He's afebrile hemodynamically stable lactic acid within the limits of normal -Obtain blood cultures -Sputum cultures -Strep pneumo urine antigen -Track lactic acid -Rocephin and azithromycin -Excision supplementation  #4. Acute on chronic diastolic heart failure. Echo in 2016 revealed an EF of 60%. Slowly worsening lower extremity edema. BNP 264. Increased oxygen demand. Oxygen recently increased to 24/7, Lasix recently increased to 80 mg daily with little relief. Home medications include amlodipine, Cardizem, Lasix -Obtain daily weights -Monitor intake and output -Resume Cardizem and Lasix at increased doses noted above -Hold amlodipine -obtain echo  #5. History of paroxysmal A. fib. Currently sinus tachycardia rate 134. Initial troponin negative. EKG reveals this tach with nonspecific intraventricular conduction block, LBBB. He reports having had his Cardizem this morning. He is provided with 5 mg  of metoprolol IV in the emergency department. Xarelto. [place] vasc score 4 -Cycle troponins -Serial EKG -TSH -prn metoprolol -continue home meds -monitor  #.6 Hypertension. [place] control emergency department. Home medications as noted above. - Continue IV Lasix as noted above. - Continue Cardizem. - Hold amlodipine for now -monitor  #7. Diabetes type 2. Home regimen includes Lantus oral agents and sliding scale. Serum glucose 166 on admission -Continue Lantus at a slightly lower dose -Hold oral agents for now -Sliding scale insulin for optimal control -Obtain a hemoglobin A1c  #8. Chronic kidney disease. Stage II. Creatinine 1.07 on admission. -Continue Lasix -Monitor urine output -Recheck in the morning  #9. CAD. No chest pain. Initial troponin negative. EKG as noted above. ?new LBBB -Cycle troponin -Serial EKG -home meds as noted above     DVT prophylaxis: xaerlto  Code Status: full  Family Communication: wife daughter at bedside  Disposition Plan: home  Consults called: none  Admission status: inpatient    Radene Gunning MD [facility]  If 7PM-7AM, please contact night-coverage [email or web address] Password [email or web address]  06/04/2016, 3:13 PM

## 2016-06-05 ENCOUNTER — Inpatient Hospital Stay (HOSPITAL_COMMUNITY): Payer: Medicare Other

## 2016-06-05 ENCOUNTER — Other Ambulatory Visit: Payer: Self-pay | Admitting: Family Medicine

## 2016-06-05 ENCOUNTER — Other Ambulatory Visit: Payer: Self-pay | Admitting: *Deleted

## 2016-06-05 DIAGNOSIS — R0603 Acute respiratory distress: Secondary | ICD-10-CM

## 2016-06-05 DIAGNOSIS — I5032 Chronic diastolic (congestive) heart failure: Secondary | ICD-10-CM

## 2016-06-05 DIAGNOSIS — N181 Chronic kidney disease, stage 1: Secondary | ICD-10-CM

## 2016-06-05 DIAGNOSIS — I10 Essential (primary) hypertension: Secondary | ICD-10-CM

## 2016-06-05 DIAGNOSIS — J438 Other emphysema: Secondary | ICD-10-CM

## 2016-06-05 DIAGNOSIS — N179 Acute kidney failure, unspecified: Secondary | ICD-10-CM

## 2016-06-05 DIAGNOSIS — J180 Bronchopneumonia, unspecified organism: Secondary | ICD-10-CM

## 2016-06-05 DIAGNOSIS — R000 Tachycardia, unspecified: Secondary | ICD-10-CM

## 2016-06-05 LAB — MRSA PCR SCREENING: MRSA BY PCR: POSITIVE — AB

## 2016-06-05 LAB — GLUCOSE, CAPILLARY
GLUCOSE-CAPILLARY: 303 mg/dL — AB (ref 65–99)
GLUCOSE-CAPILLARY: 390 mg/dL — AB (ref 65–99)
GLUCOSE-CAPILLARY: 432 mg/dL — AB (ref 65–99)
GLUCOSE-CAPILLARY: 363 mg/dL — AB (ref 65–99)
GLUCOSE-CAPILLARY: 369 mg/dL — AB (ref 65–99)
GLUCOSE-CAPILLARY: 315 mg/dL — AB (ref 65–99)
Glucose-Capillary: 243 mg/dL — ABNORMAL HIGH (ref 65–99)
Glucose-Capillary: 277 mg/dL — ABNORMAL HIGH (ref 65–99)
Glucose-Capillary: 344 mg/dL — ABNORMAL HIGH (ref 65–99)
Glucose-Capillary: 424 mg/dL — ABNORMAL HIGH (ref 65–99)

## 2016-06-05 LAB — EXPECTORATED SPUTUM ASSESSMENT W REFEX TO RESP CULTURE

## 2016-06-05 LAB — CBC
HCT: 40.3 % (ref 39.0–52.0)
Hemoglobin: 12.3 g/dL — ABNORMAL LOW (ref 13.0–17.0)
MCH: 25.5 pg — AB (ref 26.0–34.0)
MCHC: 30.5 g/dL (ref 30.0–36.0)
MCV: 83.6 fL (ref 78.0–100.0)
Platelets: 180 10*3/uL (ref 150–400)
RBC: 4.82 MIL/uL (ref 4.22–5.81)
RDW: 16.4 % — ABNORMAL HIGH (ref 11.5–15.5)
WBC: 9 10*3/uL (ref 4.0–10.5)

## 2016-06-05 LAB — BASIC METABOLIC PANEL
ANION GAP: 12 (ref 5–15)
Anion gap: 11 (ref 5–15)
BUN: 35 mg/dL — ABNORMAL HIGH (ref 6–20)
BUN: 49 mg/dL — ABNORMAL HIGH (ref 6–20)
CALCIUM: 8.3 mg/dL — AB (ref 8.9–10.3)
CO2: 25 mmol/L (ref 22–32)
CO2: 24 mmol/L (ref 22–32)
Calcium: 8.7 mg/dL — ABNORMAL LOW (ref 8.9–10.3)
Chloride: 102 mmol/L (ref 101–111)
Chloride: 98 mmol/L — ABNORMAL LOW (ref 101–111)
Creatinine, Ser: 1.46 mg/dL — ABNORMAL HIGH (ref 0.61–1.24)
Creatinine, Ser: 1.61 mg/dL — ABNORMAL HIGH (ref 0.61–1.24)
GFR calc Af Amer: 46 mL/min — ABNORMAL LOW (ref >60)
GFR calc non Af Amer: 45 mL/min — ABNORMAL LOW (ref >60)
GFR, EST AFRICAN AMERICAN: 52 mL/min — AB (ref >60)
GFR, EST NON AFRICAN AMERICAN: 40 mL/min — AB (ref >60)
GLUCOSE: 417 mg/dL — AB (ref 65–99)
Glucose, Bld: 364 mg/dL — ABNORMAL HIGH (ref 65–99)
Potassium: 3.9 mmol/L (ref 3.5–5.1)
Potassium: 3.8 mmol/L (ref 3.5–5.1)
Sodium: 138 mmol/L (ref 135–145)
Sodium: 134 mmol/L — ABNORMAL LOW (ref 135–145)

## 2016-06-05 LAB — TROPONIN I: TROPONIN I: 0.04 ng/mL — AB (ref <0.03)

## 2016-06-05 LAB — HEMOGLOBIN A1C
Hgb A1c MFr Bld: 7.3 % — ABNORMAL HIGH (ref 4.8–5.6)
MEAN PLASMA GLUCOSE: 163 mg/dL

## 2016-06-05 LAB — STREP PNEUMONIAE URINARY ANTIGEN: Strep Pneumo Urinary Antigen: NEGATIVE

## 2016-06-05 MED ORDER — METHYLPREDNISOLONE SODIUM SUCC 125 MG IJ SOLR
INTRAMUSCULAR | Status: AC
  Administered 2016-06-05: 17:00:00
  Filled 2016-06-05: qty 2

## 2016-06-05 MED ORDER — FUROSEMIDE 10 MG/ML IJ SOLN
INTRAMUSCULAR | Status: AC
  Filled 2016-06-05: qty 8

## 2016-06-05 MED ORDER — CHLORHEXIDINE GLUCONATE 0.12 % MT SOLN
15.0000 mL | Freq: Two times a day (BID) | OROMUCOSAL | Status: DC
  Administered 2016-06-05: 15 mL via OROMUCOSAL

## 2016-06-05 MED ORDER — MUPIROCIN 2 % EX OINT
1.0000 "application " | TOPICAL_OINTMENT | Freq: Two times a day (BID) | CUTANEOUS | Status: AC
  Administered 2016-06-05 – 2016-06-09 (×9): 1 via NASAL
  Filled 2016-06-05: qty 22

## 2016-06-05 MED ORDER — IPRATROPIUM-ALBUTEROL 0.5-2.5 (3) MG/3ML IN SOLN
3.0000 mL | Freq: Four times a day (QID) | RESPIRATORY_TRACT | Status: DC
  Administered 2016-06-05 – 2016-06-08 (×11): 3 mL via RESPIRATORY_TRACT
  Filled 2016-06-05 (×11): qty 3

## 2016-06-05 MED ORDER — ARFORMOTEROL TARTRATE 15 MCG/2ML IN NEBU
15.0000 ug | INHALATION_SOLUTION | Freq: Two times a day (BID) | RESPIRATORY_TRACT | Status: DC
  Administered 2016-06-05 – 2016-06-08 (×6): 15 ug via RESPIRATORY_TRACT
  Filled 2016-06-05 (×6): qty 2

## 2016-06-05 MED ORDER — BUDESONIDE 0.5 MG/2ML IN SUSP
0.5000 mg | Freq: Two times a day (BID) | RESPIRATORY_TRACT | Status: DC
  Administered 2016-06-05 – 2016-06-08 (×6): 0.5 mg via RESPIRATORY_TRACT
  Filled 2016-06-05 (×6): qty 2

## 2016-06-05 MED ORDER — ORAL CARE MOUTH RINSE: 15.0000 mL | Freq: Two times a day (BID) | OROMUCOSAL | Status: DC

## 2016-06-05 MED ORDER — METHYLPREDNISOLONE SODIUM SUCC 125 MG IJ SOLR: 80.0000 mg | Freq: Once | INTRAMUSCULAR | Status: DC

## 2016-06-05 MED ORDER — IPRATROPIUM-ALBUTEROL 0.5-2.5 (3) MG/3ML IN SOLN
3.0000 mL | Freq: Four times a day (QID) | RESPIRATORY_TRACT | Status: DC
  Administered 2016-06-05: 3 mL via RESPIRATORY_TRACT
  Filled 2016-06-05: qty 3

## 2016-06-05 MED ORDER — INSULIN ASPART 100 UNIT/ML ~~LOC~~ SOLN
10.0000 [IU] | Freq: Once | SUBCUTANEOUS | Status: AC
  Administered 2016-06-05: 10 [IU] via SUBCUTANEOUS

## 2016-06-05 MED ORDER — INSULIN ASPART 100 UNIT/ML ~~LOC~~ SOLN: 0.0000 [IU] | SUBCUTANEOUS | Status: DC

## 2016-06-05 MED ORDER — POLYETHYLENE GLYCOL 3350 17 G PO PACK
17.0000 g | PACK | Freq: Every day | ORAL | Status: DC
  Administered 2016-06-05 – 2016-06-11 (×3): 17 g via ORAL
  Filled 2016-06-05 (×4): qty 1

## 2016-06-05 MED ORDER — FUROSEMIDE 40 MG PO TABS: ORAL_TABLET | ORAL | 3 refills | Status: DC

## 2016-06-05 MED ORDER — CHLORHEXIDINE GLUCONATE CLOTH 2 % EX PADS
6.0000 | MEDICATED_PAD | Freq: Every day | CUTANEOUS | Status: DC
  Administered 2016-06-06: 6 via TOPICAL

## 2016-06-05 MED ORDER — INSULIN ASPART 100 UNIT/ML ~~LOC~~ SOLN: 0.0000 [IU] | Freq: Every day | SUBCUTANEOUS | Status: DC

## 2016-06-05 MED ORDER — SODIUM CHLORIDE 0.9 % IV SOLN: 250.0000 mL | INTRAVENOUS | Status: DC | PRN

## 2016-06-05 MED ORDER — FUROSEMIDE 10 MG/ML IJ SOLN
80.0000 mg | Freq: Two times a day (BID) | INTRAMUSCULAR | Status: DC
Start: 2016-06-05 — End: 2016-06-10
  Administered 2016-06-05 – 2016-06-09 (×10): 80 mg via INTRAVENOUS
  Filled 2016-06-05 (×11): qty 8

## 2016-06-05 MED ORDER — DOCUSATE SODIUM 100 MG PO CAPS
100.0000 mg | ORAL_CAPSULE | Freq: Two times a day (BID) | ORAL | Status: DC
  Administered 2016-06-05 – 2016-06-11 (×11): 100 mg via ORAL
  Filled 2016-06-05 (×13): qty 1

## 2016-06-05 MED ORDER — INSULIN ASPART 100 UNIT/ML ~~LOC~~ SOLN: 0.0000 [IU] | Freq: Three times a day (TID) | SUBCUTANEOUS | Status: DC

## 2016-06-05 MED ORDER — SODIUM CHLORIDE 0.9 % IV SOLN
INTRAVENOUS | Status: DC
  Administered 2016-06-05: 3.6 [IU]/h via INTRAVENOUS
  Filled 2016-06-05 (×2): qty 1

## 2016-06-05 NOTE — Progress Notes (Signed)
The patient is feeling out of breath and requested a breathing treatment. Paged respiratory to give PRN treatment. Patient on 3 liters nasal canula sitting on the side of bed, labored, wheezes, and crackles in lower lobes. I will continue to monitor closely.   Saddie Benders RN

## 2016-06-05 NOTE — Consult Note (Signed)
[facility] PCCM Attending  I have seen and examined the patient with Hayden Pedro and agree with the note above.  We formulated the plan together and I elicited the following history.    This is a 76 y/o male with CAD and diastolic heart failure admitted on 5/31 with increasing weight and dyspnea over several days. He was initially treated for a COPD exacerbation and CHF exacerbation.  He was given IV lasix but did not diurese much in hospital and eventually had worsening respiratory failure requiring BIPAP on 6/1 and PCCM was consulted for further evaluation.  He has been feeling better since starting BIPAP.  He tells me that he has not had a fever or cough in the last few days, no mucus production or sick contacts.  A CT image of his chest was ordered by our office on 5/29 and was read as having pneumonia.  A Zpack was prescribed.  Past Medical History:  Diagnosis Date  . Acute respiratory distress 06/04/2016  . Adenomatous colon polyp 10/16/2011   Repeat 2018  . Arthritis     Hips, R>L & KNEES  . CAD, multiple vessel    3V CAD cath 08/08/13----CABG done shortly after.  . 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 pylori neg, no dysplasia or malignancy.  . Chronic diastolic heart failure ([place]) 2016  . Chronic renal insufficiency, stage II (mild)   . COPD (chronic obstructive pulmonary disease) ([place])    GOLD II.  Spirometry  2004 borderline obstruction; 2015 mod obst: noncompliant with bid symbicort so pulmonologist switched him to a once daily inhaler: Trelegy ellipta 12/2015.  . Diabetic nephropathy ([place])    Elevated urine microalb/cr 03/2011  . DM type 2 (diabetes mellitus, type 2) ([place])    Poor control on max oral meds--pt eventually agreed to insulin therapy.  As of 2017 his DM is being managed by Dr. Dwyane Dee in endo.  . DOE (dyspnea on exertion)    COPD + chronic diastolic HF  . Erectile dysfunction    Normal testosterone  . Hearing loss of both ears 2016    Hearing aids  . Hyperlipidemia   . Hyperplastic colon polyp 2001  . Hypertension   . Iron deficiency anemia 2014   03/2012 capsule endoscopy showed 2 AVMs--likely responsible for his IDA--lifetime iron supp recommended + q29moCBCs.  . Macular degeneration, dry    Mild, bilat (Optometrist, DMayford Knifeat [employer]  . Obesity   . Open toe wound 11/2015   Wound care clinic appt made and then canceled when toe improved.  . Other and unspecified angina pectoris   . PAD (peripheral artery disease) ([place] 02/2016   Abnormal ABI's and waveforms: LE arterial duplex ordered for f/u as per cardiologist's recommendation.  Vasc eval by Dr. CBridgett Larsson impression was minimal PAD, recommended maximize med mgmt.  .Marland KitchenPAF (paroxysmal atrial fibrillation) ([place] 10/2014   xarelto  . Pericardial effusion with cardiac tamponade 6//29/15   pericardiocentesis was done,  Infectious/inflamm (cytology showed NO MALIGNANT CELLS)  . Pleural plaque    asbestos exposure x 2 yrs: Dr. MVaughan Brownerordered CT chest for further eval on 02/2016  . Pneumonia   . Tobacco dependence in remission    100+ pack-yr hx: quit after CABG     Family History  Problem Relation Age of Onset  . Heart disease Father   . Heart attack Father   . CVA Mother   . Hypertension Mother   .  Diabetes Paternal Grandmother   . Breast cancer Sister   . Diabetes Maternal Uncle   . Heart disease Maternal Uncle   . Stroke Neg Hx      Social History   Social History  . Marital status: Married    Spouse name: [name]  . Number of children: 4  . Years of education: N/A   Occupational History  . bus operator    Social History Main Topics  . Smoking status: Former Smoker    Packs/day: 0.25    Years: 62.00    Types: Cigarettes    Quit date: 10/23/2015  . Smokeless tobacco: Former Systems developer    Quit date: 06/30/2013  . Alcohol use No  . Drug use: No  . Sexual activity: No   Other Topics Concern  . Not on file    Social History Narrative   Married, 4 children.   Formerly a Medical illustrator.   Local transportation bus driver.  Level of education: HS.     +tobacco--lifelong (still smoking as of 08/2011).  No alcohol or drugs.   No exercise.  +excessive caffeine.     Allergies  Allergen Reactions  . Morphine And Related Other (See Comments)    Drenched with perspiration  . Demerol [Meperidine] Nausea Only  . Starlix [Nateglinide] Other (See Comments)    gassy     _0 @ Vitals:   06/05/16 1300 06/05/16 1441 06/05/16 1535 06/05/16 1641  BP: 130/86  112/66   Pulse: (!) 102   100  Resp: (!) 22  (!) 28 (!) 29  Temp: 98.2 F (36.8 C)     TempSrc: Oral     SpO2: 92% 90%  100%  Weight:      Height:       FiO2 (%):  [80 %] 80 %  On exam General:  Resting comfortably in bed on BIPAP HENT: NCAT BIPAP mask in place PULM: Poor air movement, vent supported breathing normal effort CV: RRR, no mgr GI: BS+, soft, nontender MSK: normal bulk and tone Neuro: awake, alert, no distress, MAEW  CXR images reviewed and CT chest > bilateral interlobular septal thickenign with ground glass in the bases and pleural effusions, most consistent with pulmonary edema  CBC    Component Value Date/Time   WBC 9.0 06/05/2016 0234   RBC 4.82 06/05/2016 0234   HGB 12.3 (L) 06/05/2016 0234   HCT 40.3 06/05/2016 0234   PLT 180 06/05/2016 0234   MCV 83.6 06/05/2016 0234   MCH 25.5 (L) 06/05/2016 0234   MCHC 30.5 06/05/2016 0234   RDW 16.4 (H) 06/05/2016 0234   LYMPHSABS 1.0 06/04/2016 1212   MONOABS 1.1 (H) 06/04/2016 1212   EOSABS 0.0 06/04/2016 1212   BASOSABS 0.0 06/04/2016 1212   BMET    Component Value Date/Time   NA 138 06/05/2016 0234   K 3.9 06/05/2016 0234   CL 102 06/05/2016 0234   CO2 25 06/05/2016 0234   GLUCOSE 364 (H) 06/05/2016 0234   BUN 35 (H) 06/05/2016 0234   CREATININE 1.46 (H) 06/05/2016 0234   CREATININE 1.01 10/31/2015 1229   CALCIUM 8.7 (L)  06/05/2016 0234   GFRNONAA 45 (L) 06/05/2016 0234   GFRAA 52 (L) 06/05/2016 0234   Impression/Plan  Acute respiratory failure with hypoxemia> likely mixed picture of both acute pulmonary edema and an acute exacerbation of COPD.  With lack of purulent mucus production, consolidation on chest imaging or fever I doubt pneumonia.  He has improved on BIPAP but  needs close monitoring.  PFT in 08/2013 : ratio 65%, FEV1 2.12L 65% pred  > transfer to ICU > BIPAP now and prn > abg now > lasix now > continue solumedrol now > add brovana/pulmicort > continue duoneb scheduled > albuterol prn Close monitoring in ICU  Acute diastolic heart failure > continue diuresis > monitor BP closely > continue IMDUR  AKI > monitor UOP closely > continue lasix given interlobular septal thickening on exam with ground glass, I favor this represents volume overload  My cc time 35 minutes  Roselie Awkward, MD [place] PCCM Pager: [phone] Cell: [phone] After 3pm or if no response, call [phone]

## 2016-06-05 NOTE — Progress Notes (Signed)
Report called to Warden Fillers, RN.

## 2016-06-05 NOTE — Significant Event (Signed)
Rapid Response Event Note  Overview:  Called to assist with patient with respiratory distress Time Called: 1630 Arrival Time: 1630 Event Type: Respiratory  Initial Focused Assessment:  On my arrival patient supine in bed - just placed on Bipap - alert - afib on monitor - warm and dry - alert - follows commands - decreasing WOB on Bipap =bil BS with wheezing and crackles.  3 RN's from [facility] at bedside - 2 IV RN's starting lines -   Amy Clegg RN and Dr. [name].  BP 159/79 HR 112 RR 28 O2 sats 87 %.     Interventions:  ABG -  Solumedrol and Lasix per heart team - Bipap.  Decreased WOB - alert - follows commands - reports relief of SOB.  Tol Bipap 18/8 with 80% O2.  PCCM to bedside.  Transferred to 2h18 on Bipap without incident.  Handoff per Otila Kluver RN to [employer].    Plan of Care (if not transferred):  Event Summary: Name of Physician Notified: Dr. Ree Kida at  (pta RRT)  Name of Consulting Physician Notified: Dr. [name] and Dr. Lake Bells at  (by Dr. Ree Kida)  Outcome: Transferred (Comment)  Event End Time: 1720  Quin Hoop

## 2016-06-05 NOTE — Progress Notes (Addendum)
PROGRESS NOTE    Joshua [name]  LZJ:[id number] DOB: 06/11/1940 DOA: 06/04/2016 PCP: Tammi Sou, MD   Chief Complaint  Patient presents with  . Shortness of Breath  . Tachycardia    Brief Narrative:  HPI on 06/04/2016 by Ms. Dyanne Carrel, NP Joshua [name] is a very pleasant 76 y.o. male with medical history significant chronic diastolic heart failure, COPD on home oxygen, COPD, history of asbestos exposure, hypertension, hyperlipidemia, diabetes presents to the emergency department from his primary care provider's office chief complaint gradual persistent worsening of shortness of breath and lower extremity edema. Initial evaluation reveals respiratory distress likely related to COPD exacerbation in the setting of community-acquired pneumonia and mild acute on chronic heart failure.  Information is obtained from the patient, the family his at the bedside, the chart. Patient's been experiencing gradual worsening shortness of breath for the last 2 weeks. His diagnosis was acute on chronic diastolic heart failure and his Lasix was increased. He had increased oxygen demand as well and was stated that his when necessary oxygen was prescribed as continuous. When he did not improve CT was obtained 2 days ago revealed a former pulmonary nodule absent, changes consistent with bronchopneumonia and he was given a Z-Pak. He denies headache dizziness chest pain palpitation abdominal pain nausea vomiting. He denies fever chills recent travel or sick contacts. He denies diarrhea constipation melena bright red blood per rectum. He denies dysuria hematuria frequency or urgency. He does have intermittent wet sounding but nonproductive cough. He went to see his primary care provider today who opined hypoxia with ambulation with resting tachycardia recommended transport to the ED.  Addendum:  Called by RN as patient was experiencing respiratory distress.  Noted to by mildly cyanotic.  Orders placed for BIPAP and  ABG. Patient given extra dose of IV lasix and solumedrol. PCCM consulted and appreciated.  At bedside, with cardiology/pulmonology. Patient appears to be improving with BIPAP. Patient to be transferred to ICU. BP stable, HR - monitor Afib 105-110, respirations 28.   Currently on BIPAP.  Gen: Resp distress on BIPAP Cardio: Irregularly irregular, tachy Resp: Diminished breath sounds, tachypneic Extremities: +LE edema  As above, PCCM consulted, plan for continued BIPAP and diuresis. Echocardiogram pending. Will change insulin coverage to resistant and give additional 10 units as CBGs has been in the high 390-430. Multifactorial causes of resp failure, pneumonia, CHF, COPD.   Additional critical care time spent 34 minutes  Assessment & Plan   Acute on chronic hypoxic respiratory failure  -Secondary to multifactorial causes including pneumonia, CHF exacerbation, COPD exacerbation -Patient does use 2.5 L oxygen at home as needed, particularly at night -Patient has needed to use his oxygen more frequently and during the daytime. -Treat underlying causes, continue supplemental oxygen to maintain saturations above 02%  Acute diastolic heart failure exacerbation -Patient has been taking Lasix, and recent weeks has increased dose to double without resolution of his symptoms -Last echocardiogram 10/30/2014 showed an EF of 60-65%, wall motion was normal, no RW in a. Systolic pulmonary artery pressure mildly increased -Patient has been followed by home health RN -Cardiology consultation appreciated -Currently on Lasix IV 80 mg twice a day -Monitor intake and output, daily weights -Cardiology has asked for CVP and CO-OX monitoring -Echocardiogram pending  COPD exacerbation -Continue supplemental oxygen, Solu-Medrol, nebulizer treatments, antibiotics, antitussives  Bronchopneumonia -CT chest prior to admission showed new mild patchy peribronchial vascular consultation right upper lobe, mild patchy  ground glass attenuation in both lungs favoring  a mild bronchopneumonia -Continue azithromycin, ceftriaxone -Currently patient afebrile with no leukocytosis -Blood and sputum cultures pending  Atrial fibrillation -CHADSVASC at least 5 (given age, HTN, DM, CHF) -Continue Cardizem -Continue Xarelto -Cardiology may consider cardioversion once acute illness improves  Diabetes mellitus, type II with neuropathy -Continue Lantus, insulin sliding scale, CBG monitoring -Actos held (likely will be discontinued on discharge given this is contraindicated in CHF patients) -Gabapentin held  Acute kidney injury superimposed on Chronic kidney disease,stage III -Creatinine currently 1.46 -Continue to monitor BMP closely as patient is currently diuresing  Coronary artery disease -Currently chest pain-free -Troponin mildly elevated at 0.04 and flat -Continue statin, imdur -Patient on Xarelto, not aspirin  Depression -Continue Wellbutrin, Celexa  Constipation -Will order bowel regimen  DVT Prophylaxis  Xarelto  Code Status: Full  Family Communication: Family at bedside  Disposition Plan: Admitted. Transferred to stepdown.  Consultants Cardiology  Procedures  None  Antibiotics   Anti-infectives    Start     Dose/Rate Route Frequency Ordered Stop   06/04/16 2000  cefTRIAXone (ROCEPHIN) 1 g in dextrose 5 % 50 mL IVPB     1 g 100 mL/hr over 30 Minutes Intravenous Every 24 hours 06/04/16 1923 06/10/16 1959   06/04/16 2000  azithromycin (ZITHROMAX) tablet 500 mg     500 mg Oral Every 24 hours 06/04/16 1923 06/10/16 1959   06/04/16 1600  cefTRIAXone (ROCEPHIN) 1 g in dextrose 5 % 50 mL IVPB  Status:  Discontinued     1 g 100 mL/hr over 30 Minutes Intravenous Every 24 hours 06/04/16 1510 06/04/16 1923   06/04/16 1600  azithromycin (ZITHROMAX) tablet 500 mg  Status:  Discontinued     500 mg Oral Every 24 hours 06/04/16 1510 06/04/16 1923      Subjective:   Joshua [name] seen and  examined today.  Patient continues to feel short of breath and has had occasional cough. Denies current chest pain, abdominal pain, nausea or vomiting, dizziness or headache. Does have some constipation.  Objective:   Vitals:   06/05/16 0833 06/05/16 0925 06/05/16 1154 06/05/16 1300  BP:  128/74 (!) 141/66 130/86  Pulse: 100 94 85 (!) 102  Resp:  (!) 21  (!) 22  Temp:  98.4 F (36.9 C)  98.2 F (36.8 C)  TempSrc:  Oral  Oral  SpO2: (!) 87% 93%  92%  Weight:      Height:        Intake/Output Summary (Last 24 hours) at 06/05/16 1442 Last data filed at 06/05/16 1105  Gross per 24 hour  Intake              530 ml  Output             1700 ml  Net            -1170 ml   Filed Weights   06/04/16 1131 06/05/16 0445  Weight: 115.2 kg (254 lb) 114.5 kg (252 lb 8 oz)    Exam  General: Well developed, well nourished, NAD, appears stated age  HEENT: NCAT, mucous membranes moist.   Neck: Supple, + JVD, no masses  Cardiovascular: S1 S2 auscultated, irregularly irregular, no murmurs appreciated  Respiratory: Crackles notes Right lung fields. Mild exp wheezing.   Abdomen: Soft, obese, nontender, nondistended, + bowel sounds  Extremities: warm dry without cyanosis clubbing. +LE edema B/L   Neuro: AAOx3, nonfocal  Psych: Normal affect and demeanor with intact judgement and insight   Data Reviewed: I  have personally reviewed following labs and imaging studies  CBC:  Recent Labs Lab 06/04/16 1212 06/05/16 0234  WBC 9.6 9.0  NEUTROABS 7.5  --   HGB 13.1 12.3*  HCT 42.6 40.3  MCV 83.9 83.6  PLT PLATELET CLUMPS NOTED ON SMEAR, COUNT APPEARS ADEQUATE 174   Basic Metabolic Panel:  Recent Labs Lab 06/04/16 1212 06/04/16 1629 06/05/16 0234  NA 140  --  138  K 3.5  --  3.9  CL 103  --  102  CO2 24  --  25  GLUCOSE 160*  --  364*  BUN 23*  --  35*  CREATININE 1.07  --  1.46*  CALCIUM 9.1  --  8.7*  MG  --  2.1  --   PHOS  --  4.5  --    GFR: Estimated Creatinine  Clearance: 54.5 mL/min (A) (by C-G formula based on SCr of 1.46 mg/dL (H)). Liver Function Tests:  Recent Labs Lab 06/04/16 1212  AST 25  ALT 18  ALKPHOS 81  BILITOT 1.0  PROT 6.3*  ALBUMIN 3.4*   No results for input(s): LIPASE, AMYLASE in the last 168 hours. No results for input(s): AMMONIA in the last 168 hours. Coagulation Profile:  Recent Labs Lab 06/04/16 1212  INR 1.28   Cardiac Enzymes:  Recent Labs Lab 06/04/16 1629 06/04/16 2103 06/05/16 0234  TROPONINI 0.05* 0.04* 0.04*   BNP (last 3 results) No results for input(s): PROBNP in the last 8760 hours. HbA1C:  Recent Labs  06/04/16 1629  HGBA1C 7.3*   CBG:  Recent Labs Lab 06/04/16 1737 06/04/16 2119 06/05/16 0613 06/05/16 1138  GLUCAP 231* 317* 303* 390*   Lipid Profile: No results for input(s): CHOL, HDL, LDLCALC, TRIG, CHOLHDL, LDLDIRECT in the last 72 hours. Thyroid Function Tests:  Recent Labs  06/04/16 1629  TSH 1.307   Anemia Panel: No results for input(s): VITAMINB12, FOLATE, FERRITIN, TIBC, IRON, RETICCTPCT in the last 72 hours. Urine analysis:    Component Value Date/Time   COLORURINE STRAW (A) 06/04/2016 1203   APPEARANCEUR CLEAR 06/04/2016 1203   LABSPEC 1.009 06/04/2016 1203   PHURINE 5.0 06/04/2016 1203   GLUCOSEU >=500 (A) 06/04/2016 1203   HGBUR NEGATIVE 06/04/2016 [place] 06/04/2016 [place] 06/04/2016 1203   PROTEINUR NEGATIVE 06/04/2016 1203   UROBILINOGEN 0.2 08/11/2013 1624   NITRITE NEGATIVE 06/04/2016 1203   LEUKOCYTESUR NEGATIVE 06/04/2016 1203   Sepsis Labs: _0 (procalcitonin:4,lacticidven:4)  ) Recent Results (from the past 240 hour(s))  Culture, sputum-assessment     Status: None   Collection Time: 06/04/16  9:29 AM  Result Value Ref Range Status   Specimen Description EXPECTORATED SPUTUM  Final   Special Requests NONE  Final   Sputum evaluation   Final    Sputum specimen not acceptable for testing.   Please recollect.   Gram Stain Report Called to,Read Back By and Verified With: K PASEDA,RN AT 1038 06/05/16 BY L BENFIELD    Report Status 06/05/2016 FINAL  Final  Culture, blood (routine x 2)     Status: None (Preliminary result)   Collection Time: 06/04/16  4:40 PM  Result Value Ref Range Status   Specimen Description BLOOD RIGHT ANTECUBITAL  Final   Special Requests   Final    BOTTLES DRAWN AEROBIC AND ANAEROBIC Blood Culture adequate volume   Culture NO GROWTH < 24 HOURS  Final   Report Status PENDING  Incomplete  Culture, blood (routine x 2)  Status: None (Preliminary result)   Collection Time: 06/04/16  4:50 PM  Result Value Ref Range Status   Specimen Description BLOOD RIGHT HAND  Final   Special Requests IN PEDIATRIC BOTTLE Blood Culture adequate volume  Final   Culture NO GROWTH < 24 HOURS  Final   Report Status PENDING  Incomplete      Radiology Studies: Dg Chest Portable 1 View  Result Date: 06/04/2016 CLINICAL DATA:  Episode of shortness of breath 2-3 weeks ago without chest pain. One year history of intermittently productive cough. Discontinued smoking 8 months ago. History of diabetes and COPD. 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 EXAM: PORTABLE CHEST 1 VIEW COMPARISON:  CT scan of the chest of Jun 02, 2016 and chest x-ray of May 18, 2016. FINDINGS: The lungs are adequately inflated. The interstitial markings remain increased air and are slightly more conspicuous overall today. The cardiac silhouette is enlarged today in the pulmonary vascularity is more engorged. The patient has undergone previous CABG. There is calcification in the wall of the aortic arch. There are calcified pleural plaques bilaterally. The observed bony thorax exhibits no acute abnormality. IMPRESSION: Mild interval worsening in the appearance of the pulmonary interstitium which likely reflects CHF superimposed on known chronic lung disease. There is no alveolar pneumonia. Electronically Signed    By: David  Martinique M.D.   On: 06/04/2016 12:51     Scheduled Meds: . albuterol  2.5 mg Nebulization BID  . atorvastatin  40 mg Oral Daily  . azithromycin  500 mg Oral Q24H  . buPROPion  150 mg Oral BID  . citalopram  40 mg Oral Daily  . diltiazem  240 mg Oral Daily  . furosemide  80 mg Intravenous BID  . guaiFENesin  1,200 mg Oral BID  . insulin aspart  0-5 Units Subcutaneous QHS  . insulin aspart  0-9 Units Subcutaneous TID WC  . insulin glargine  50 Units Subcutaneous QHS  . ipratropium-albuterol  3 mL Nebulization Q6H  . isosorbide mononitrate  30 mg Oral Daily  . mouth rinse  15 mL Mouth Rinse BID  . methylPREDNISolone (SOLU-MEDROL) injection  40 mg Intravenous Q12H  . rivaroxaban  20 mg Oral Q supper  . sodium chloride flush  3 mL Intravenous Q12H  . sodium chloride flush  3 mL Intravenous Q12H  . tamsulosin  0.4 mg Oral QPC supper   Continuous Infusions: . sodium chloride    . cefTRIAXone (ROCEPHIN)  IV Stopped (06/04/16 2117)     LOS: 1 day   Time Spent in minutes   45 minutes  Joshua [name] D.O. on 06/05/2016 at 2:42 PM  Between 7am to 7pm - Pager - [phone]  After 7pm go to [email or web address] - password [email or web address]  And look for the night coverage person covering for me after hours  [facility] Office  [phone]

## 2016-06-05 NOTE — Progress Notes (Signed)
RT called to patient room due to patient having increased work of breathing and change in color.  Patient noted to be in respiratory distress.  Per MD, placed on bipap,  ABG was obtained, and given a PRN treatment.  Patient was then transported from 3W20 to room 2H18, on bipap, without complications.  Results of ABG were given to MD.  Patient is currently tolerating bipap well.  Will continue to monitor.

## 2016-06-05 NOTE — Progress Notes (Signed)
Nutrition Brief Note  Received nutrition consult from the COPD Gold order set. Patient reports good intake PTA. No weight loss.  Nutrition focused physical exam completed.  No muscle or subcutaneous fat depletion noticed.  Wt Readings from Last 15 Encounters:  06/05/16 252 lb 8 oz (114.5 kg)  06/04/16 254 lb 4 oz (115.3 kg)  05/18/16 252 lb (114.3 kg)  05/11/16 248 lb (112.5 kg)  05/06/16 253 lb (114.8 kg)  04/21/16 252 lb (114.3 kg)  04/07/16 253 lb (114.8 kg)  03/10/16 256 lb (116.1 kg)  03/04/16 249 lb (112.9 kg)  02/19/16 252 lb (114.3 kg)  01/31/16 254 lb (115.2 kg)  01/15/16 257 lb (116.6 kg)  01/09/16 254 lb (115.2 kg)  12/17/15 259 lb (117.5 kg)  12/03/15 248 lb 6.4 oz (112.7 kg)    Body mass index is 35.22 kg/m. Patient meets criteria for obesity based on current BMI.   Current diet order is heart healthy/CHO modified, patient is consuming approximately 100% of meals at this time. Labs and medications reviewed.   No nutrition interventions warranted at this time. If nutrition issues arise, please consult RD.   Molli Barrows, RD, LDN, [place] Pager [phone] After Hours Pager [phone]

## 2016-06-05 NOTE — Evaluation (Signed)
Occupational Therapy Evaluation Patient Details Name: Joshua [name] MRN: [id number] DOB: Oct 05, 1940 Today's Date: 06/05/2016    History of Present Illness 76 yo admitted with acute on chronic respiratory distress. PMHx: COPD, HTN, DM, HLD, obesity, CABG, CAD, CKD, HF   Clinical Impression   Pt was modified independent in self care and mobility prior to admission. He presents with poor activity tolerance. Will follow to educated in energy conservation and progress activity tolerance for ADL. Do not anticipate pt will need post acute OT.    Follow Up Recommendations  No OT follow up    Equipment Recommendations  None recommended by OT    Recommendations for Other Services       Precautions / Restrictions Precautions Precautions: Fall Precaution Comments: watch sats      Mobility Bed Mobility Overal bed mobility: Modified Independent               Transfers Overall transfer level: Modified independent Equipment used: None             General transfer comment: uses momentum    Balance Overall balance assessment: Needs assistance   Sitting balance-Leahy Scale: Good       Standing balance-Leahy Scale: Fair                             ADL either performed or assessed with clinical judgement   ADL Overall ADL's : Needs assistance/impaired Eating/Feeding: Independent;Sitting   Grooming: Supervision/safety;Standing   Upper Body Bathing: Set up;Sitting   Lower Body Bathing: Sit to/from stand;Modified independent   Upper Body Dressing : Set up;Sitting   Lower Body Dressing: Sit to/from stand;Modified independent   Toilet Transfer: Stand-pivot;Modified Independent   Toileting- Clothing Manipulation and Hygiene: Sit to/from stand;Modified independent       Functional mobility during ADLs: Supervision/safety (short distances in room) General ADL Comments: pt with poor activity tolerance     Vision Baseline Vision/History: Wears  glasses Wears Glasses: At all times Patient Visual Report: No change from baseline       Perception     Praxis      Pertinent Vitals/Pain Pain Assessment: No/denies pain     Hand Dominance Right   Extremity/Trunk Assessment Upper Extremity Assessment Upper Extremity Assessment: Overall WFL for tasks assessed   Lower Extremity Assessment Lower Extremity Assessment: Defer to PT evaluation   Cervical / Trunk Assessment Cervical / Trunk Assessment: Normal (forward head)   Communication Communication Communication: HOH   Cognition Arousal/Alertness: Awake/alert Behavior During Therapy: WFL for tasks assessed/performed Overall Cognitive Status: Within Functional Limits for tasks assessed                                     General Comments       Exercises     Shoulder Instructions      Home Living Family/patient expects to be discharged to:: Private residence Living Arrangements: Spouse/significant other Available Help at Discharge: Family;Available 24 hours/day Type of Home: House Home Access: Ramped entrance     Home Layout: One level     Bathroom Shower/Tub: Occupational psychologist: Handicapped height     Home Equipment: Environmental consultant - 4 wheels;Cane - single point;Shower seat;Grab bars - toilet;Grab bars - tub/shower;Hand held shower head;Adaptive equipment;Bedside commode;Other (comment) Adaptive Equipment: Sock aid;Reacher        Prior Functioning/Environment Level of  Independence: Independent with assistive device(s)        Comments: drives, cane inside, sits to shower, sock aid for L foot, rollator for community        OT Problem List: Decreased activity tolerance;Impaired balance (sitting and/or standing);Cardiopulmonary status limiting activity      OT Treatment/Interventions: Energy conservation;Patient/family education    OT Goals(Current goals can be found in the care plan section) Acute Rehab OT Goals Patient  Stated Goal: return home OT Goal Formulation: With patient Time For Goal Achievement: 06/12/16 Potential to Achieve Goals: Good  OT Frequency: Min 1X/week   Barriers to D/C:            Co-evaluation              AM-PAC PT "6 Clicks" Daily Activity     Outcome Measure Help from another person eating meals?: None Help from another person taking care of personal grooming?: A Little Help from another person toileting, which includes using toliet, bedpan, or urinal?: None Help from another person bathing (including washing, rinsing, drying)?: None Help from another person to put on and taking off regular upper body clothing?: None Help from another person to put on and taking off regular lower body clothing?: None 6 Click Score: 23   End of Session Equipment Utilized During Treatment: Oxygen  Activity Tolerance: Patient limited by fatigue Patient left: in bed;with call bell/phone within reach;with bed alarm set  OT Visit Diagnosis: Unsteadiness on feet (R26.81) (decreased  activity tolerance)                Time: 6147-0929 OT Time Calculation (min): 19 min Charges:  OT General Charges $OT Visit: 1 Procedure OT Evaluation $OT Eval Moderate Complexity: 1 Procedure G-Codes:      Malka So 06/05/2016, 9:11 AM  [phone]

## 2016-06-05 NOTE — Progress Notes (Signed)
Notified MD Ree Kida that the patient hasn't had a BM since 5/29. Orders have been placed and medication given including prune juice. I will continue to monitor closely.   Saddie Benders RN

## 2016-06-05 NOTE — Progress Notes (Addendum)
The patient laid back into the bed with the help of family at bedside and the tech and RN came to room pulled him up to help with breathing and get prepared for a PICC line to be placed. Family stated that he had been laying down for about ~15 minutes. Pulse oximeter was not picking up. Went and got a new pulse ox, stats in the upper 70's, lips blue. Amy with cardiology came in to see the patient and Joshua [name] was notified of respiratory distress and RT called for BiPAP. I will continue to monitor the patient closely.  Saddie Benders RN

## 2016-06-05 NOTE — Telephone Encounter (Signed)
[place].  RF request for furosemide LOV: 06/04/16 Next ov: None Last written: 09/22/14 #90 w/ 3RF by Dr. Ermalinda Barrios  Please advise. Thanks.

## 2016-06-05 NOTE — Telephone Encounter (Signed)
This medication is not on pts current medication list and I did not see if in history either. Please advise. Thanks.

## 2016-06-05 NOTE — Consult Note (Signed)
Advanced Heart Failure Team Consult Note   Primary Physician: Dr Anitra Lauth Primary Cardiologist:  Dr Scarlette Calico  Reason for Consultation: Acute/Chronic Diastolic Heart Failure   HPI:    Joshua [name] is seen today for evaluation of acute/chronic diastolic heart failure at the request of Dr Barbaraann Rondo.   [name] has a h/o PAF on Xarelto, diastolic heart failure, respiratory failure on 2 liters oxygen at home, CAD S/p CABG 2015 , COPD, asbestos exposure (mothers house wrapped with asbestos), HTN, hyperlipidemia, and DM.   Over the month he reports increased dyspnea with exertion so lasix was increased from 40 mg daily to 40 mg twice a day. Weight had trending up from 250-255 pounds. He was also instructed to wear 2 liters oxygen continuously. He had no improvement and had ongoing dyapsnea with exertion. CT was performed 2 days ago and was concerning for pneumonia so Z Pack was started. Yesterday he returned to PCP and sent to ED for further evaluation.   In the ED he was given solumedrol and IV lasix. Blood CX pending. Pertinent admission labs include: BNP 462, Mag 2.1, troponin 0.05>0.04> 0.04, Hgb 13.1 and creatinine 1.07.   Today creatinine trending up 1.07> 1.46.  SOB at rest.   Review of Systems: [y] = yes, _0  = no   General: Weight gain [Y ]; Weight loss _1 ; Anorexia _2 ; Fatigue [ Y]; Fever _3 ; Chills _4 ; Weakness _5   Cardiac: Chest pain/pressure _6 ; Resting SOB [Y ]; Exertional SOB [ Y]; Orthopnea _7 ; Pedal Edema _8 ; Palpitations _9 ; Syncope _10 ; Presyncope _11 ; Paroxysmal nocturnal dyspnea_12   Pulmonary: Cough _13 ; Wheezing[Y ]; Hemoptysis_14 ; Sputum _15 ; Snoring _16   GI: Vomiting_17 ; Dysphagia_18 ; Melena_19 ; Hematochezia _20 ; Heartburn_21 ; Abdominal pain _22 ; Constipation _23 ; Diarrhea _24 ; BRBPR _25   GU: Hematuria_26 ; Dysuria _27 ; Nocturia_28   Vascular: Pain in legs with walking _29 ; Pain in feet with lying flat _30 ; Non-healing sores _31 ; Stroke _32 ; TIA _33 ; Slurred  speech _34 ;  Neuro: Headaches_35 ; Vertigo_36 ; Seizures_37 ; Paresthesias_38 ;Blurred vision _39 ; Diplopia _40 ; Vision changes _41   Ortho/Skin: Arthritis _42 ; Joint pain _43 ; Muscle pain _44 ; Joint swelling _45 ; Back Pain _46 ; Rash _47   Psych: Depression_48 ; Anxiety_49   Heme: Bleeding problems _50 ; Clotting disorders _51 ; Anemia _52   Endocrine: Diabetes _53 ; Thyroid dysfunction_54   Home Medications Prior to Admission medications   Medication Sig Start Date End Date Taking? Authorizing Provider  acetaminophen (TYLENOL) 325 MG tablet Take 2 tablets (650 mg total) by mouth every 4 (four) hours as needed for headache or mild pain. 08/09/13  Yes Kilroy, Luke K, PA-C  amLODipine (NORVASC) 5 MG tablet Take 1 tablet (5 mg total) by mouth daily. 07/22/15  Yes McGowen, Adrian Blackwater, MD  Ascorbic Acid (VITAMIN C) 1000 MG tablet Take 1,000 mg by mouth daily.   Yes [provider]  aspirin 81 MG tablet Take 81 mg by mouth at bedtime. Reported on 02/26/2015   Yes [provider]  atorvastatin (LIPITOR) 40 MG tablet TAKE ONE TABLET BY MOUTH EVERY DAY 02/17/16  Yes McGowen, Adrian Blackwater, MD  azithromycin (ZITHROMAX) 250 MG tablet Take 1 tablet once daily for 6 days. 06/02/16  Yes Mannam, Hart Robinsons, MD  budesonide-formoterol (SYMBICORT) 160-4.5 MCG/ACT inhaler Inhale 2 puffs  into the lungs 2 (two) times daily. 05/17/15  Yes McGowen, Adrian Blackwater, MD  buPROPion [facility] SR) 150 MG 12 hr tablet TAKE ONE TABLET BY MOUTH TWICE DAILY 12/17/15  Yes McGowen, Adrian Blackwater, MD  Cholecalciferol (VITAMIN D-3) 1000 units CAPS Take 1,000 Units by mouth daily. Reported on 02/26/2015   Yes [provider]  Chromium Picolinate 1000 MCG TABS Take 1,000 mcg by mouth daily. Reported on 02/26/2015   Yes [provider]  citalopram (CELEXA) 40 MG tablet Take 1 tablet (40 mg total) by mouth daily. 07/23/14  Yes McGowen, Adrian Blackwater, MD  diltiazem (CARDIZEM CD) 240 MG 24 hr capsule Take 1 capsule (240 mg total) by mouth daily.  11/27/14  Yes Isaiah Serge, NP  fluticasone (FLONASE) 50 MCG/ACT nasal spray Place 2 sprays into both nostrils daily. 06/04/16  Yes McGowen, Adrian Blackwater, MD  furosemide (LASIX) 40 MG tablet Take 40 mg by mouth daily. 11/11/15  Yes Deloria Lair, NP  gabapentin (NEURONTIN) 100 MG capsule TAKE 2 CAPSULES BY MOUTH 3 TIMES DAILY Patient taking differently: take 2 capsules in the morning and 3 capsules at bedtime 12/02/15  Yes McGowen, Adrian Blackwater, MD  guaiFENesin (MUCINEX) 600 MG 12 hr tablet Take 2 tablets (1,200 mg total) by mouth 2 (two) times daily. 11/02/14  Yes Verlee Monte, MD  Insulin Glargine (TOUJEO SOLOSTAR) 300 UNIT/ML SOPN Inject 70 Units into the skin daily. Patient taking differently: Inject 60 Units into the skin daily.  04/20/16  Yes Elayne Snare, MD  insulin regular (NOVOLIN R,HUMULIN R) 100 units/mL injection Inject 16 units SQ with BF, 17 units SQ with Lunch, and 18 U SQ with Supper Patient taking differently: 28 units in the am and at lunch and 28 at bedtime 09/27/15  Yes McGowen, Adrian Blackwater, MD  isosorbide mononitrate (IMDUR) 30 MG 24 hr tablet TAKE ONE TABLET BY MOUTH DAILY 02/17/16  Yes McGowen, Adrian Blackwater, MD  Multiple Vitamin (MULTIVITAMIN WITH MINERALS) TABS Take 1 tablet by mouth daily.   Yes [provider]  nitroGLYCERIN (NITROSTAT) 0.4 MG SL tablet Place 1 tablet (0.4 mg total) under the tongue every 5 (five) minutes as needed for chest pain. 01/31/16  Yes Jettie Booze, MD  pioglitazone (ACTOS) 45 MG tablet Take 45 mg by mouth daily. Reported on 02/26/2015 06/26/13  Yes [provider]  PROAIR HFA 108 (90 Base) MCG/ACT inhaler INHALE 2 PUFFS INTO THE LUNGS EVERY 4 HOURS AS NEEDED FOR WHEEZING ORSHORTNESS OF BREATH 03/18/16  Yes McGowen, Adrian Blackwater, MD  rivaroxaban (XARELTO) 20 MG TABS tablet Take 1 tablet (20 mg total) by mouth daily with supper. 03/19/16  Yes Jettie Booze, MD  SYNJARDY 05-998 MG TABS TAKE ONE TABLET BY MOUTH TWICE DAILY AFTER A MEAL  02/17/16  Yes Elayne Snare, MD  tamsulosin (FLOMAX) 0.4 MG CAPS capsule TAKE 1 CAPSULE BY MOUTH EVERY DAY AFTER SUPPER 05/06/16  Yes McGowen, Adrian Blackwater, MD    Past Medical History: Past Medical History:  Diagnosis Date  . Acute respiratory distress 06/04/2016  . Adenomatous colon polyp 10/16/2011   Repeat 2018  . Arthritis     Hips, R>L & KNEES  . CAD, multiple vessel    3V CAD cath 08/08/13----CABG done shortly after.  . 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 pylori neg, no dysplasia or malignancy.  . Chronic diastolic heart failure ([place]) 2016  . Chronic renal insufficiency, stage II (mild)   . COPD (chronic obstructive pulmonary disease) (  [place])    GOLD II.  Spirometry  2004 borderline obstruction; 2015 mod obst: noncompliant with bid symbicort so pulmonologist switched him to a once daily inhaler: Trelegy ellipta 12/2015.  . Diabetic nephropathy ([place])    Elevated urine microalb/cr 03/2011  . DM type 2 (diabetes mellitus, type 2) ([place])    Poor control on max oral meds--pt eventually agreed to insulin therapy.  As of 2017 his DM is being managed by Dr. Dwyane Dee in endo.  . DOE (dyspnea on exertion)    COPD + chronic diastolic HF  . Erectile dysfunction    Normal testosterone  . Hearing loss of both ears 2016   Hearing aids  . Hyperlipidemia   . Hyperplastic colon polyp 2001  . Hypertension   . Iron deficiency anemia 2014   03/2012 capsule endoscopy showed 2 AVMs--likely responsible for his IDA--lifetime iron supp recommended + q52moCBCs.  . Macular degeneration, dry    Mild, bilat (Optometrist, DMayford Knifeat [place] [employer]  . Obesity   . Open toe wound 11/2015   Wound care clinic appt made and then canceled when toe improved.  . Other and unspecified angina pectoris   . PAD (peripheral artery disease) ([place] 02/2016   Abnormal ABI's and waveforms: LE arterial duplex ordered for f/u as per cardiologist's recommendation.  Vasc  eval by Dr. CBridgett Larsson impression was minimal PAD, recommended maximize med mgmt.  .Marland KitchenPAF (paroxysmal atrial fibrillation) (HLearned 10/2014   xarelto  . Pericardial effusion with cardiac tamponade 6//29/15   pericardiocentesis was done,  Infectious/inflamm (cytology showed NO MALIGNANT CELLS)  . Pleural plaque    asbestos exposure x 2 yrs: Dr. MVaughan Brownerordered CT chest for further eval on 02/2016  . Pneumonia   . Tobacco dependence in remission    100+ pack-yr hx: quit after CABG    Past Surgical History: Past Surgical History:  Procedure Laterality Date  . APPENDECTOMY  1957  . CARDIAC CATHETERIZATION  08/08/2013  . CATARACT EXTRACTION W/ INTRAOCULAR LENS  IMPLANT, BILATERAL  04/08/2006 & 04/22/2006  . CATARACT EXTRACTION W/ INTRAOCULAR LENS  IMPLANT, BILATERAL Bilateral   . CHOLECYSTECTOMY OPEN  1999  . COLONOSCOPY  10/16/2011   Procedure: COLONOSCOPY;  Surgeon: RInda Castle MD;  Location: [facility];  Service: Endoscopy;  Laterality: N/A;  . CORONARY ARTERY BYPASS GRAFT N/A 08/14/2013   Procedure: CORONARY ARTERY BYPASS GRAFTING (CABG);  Surgeon: SMelrose Nakayama MD;  [place]  Service: Open Heart Surgery;  Laterality: N/A;  Times 4   using left internal mammary artery and endoscopically harvested bilateral saphenous vein  . ESOPHAGOGASTRODUODENOSCOPY  02/25/12   Atrophic gastritis with a few erosions--capsule endoscopy planned as of 02/25/12 (Dr. KDeatra Ina.  .Marland KitchenHARDWARE REMOVAL Right 08/12/2012   Procedure: HARDWARE REMOVAL;  Surgeon: CMcarthur Rossetti MD;  Location: [facility];  Service: Orthopedics;  Laterality: Right;  . HIP SURGERY Right 1954   Repair of slipped capital femoral epiphysis.  . INTRAOPERATIVE TRANSESOPHAGEAL ECHOCARDIOGRAM N/A 08/14/2013   Normal LV function. Procedure: INTRAOPERATIVE TRANSESOPHAGEAL ECHOCARDIOGRAM;  Surgeon: SMelrose Nakayama MD;  [place]  Service: Open Heart Surgery;  Laterality: N/A;  . LEFT HEART CATHETERIZATION WITH CORONARY  ANGIOGRAM N/A 08/08/2013   Procedure: LEFT HEART CATHETERIZATION WITH CORONARY ANGIOGRAM;  Surgeon: JJettie Booze MD;  [facility];  Service: Cardiovascular;  Laterality: N/A;  . PATELLA FRACTURE SURGERY Left ~ 1979   bolt + 3 screws to repair tib plateau fx  .  PERICARDIAL TAP N/A 07/01/2013   Procedure: PERICARDIAL TAP;  Surgeon: Jettie Booze, MD;  Location: [facility] CATH LAB;  Service: Cardiovascular;  Laterality: N/A;  . TESTICLE SURGERY  as a child   Undescended testicle brought down into scrotum  . TONSILLECTOMY  1947  . TOTAL HIP ARTHROPLASTY Right 08/12/2012   Procedure: REMOVAL OF OLD PINS RIGHT HIP AND RIGHT TOTAL HIP ARTHROPLASTY ANTERIOR APPROACH;  Surgeon: Mcarthur Rossetti, MD;  Location: [facility];  Service: Orthopedics;  Laterality: Right;  . TRANSTHORACIC ECHOCARDIOGRAM  10/30/14   Mod LVH, EF 60-65%, normal wall motion, mod mitral regurg, mild PAH    Family History: Family History  Problem Relation Age of Onset  . Heart disease Father   . Heart attack Father   . CVA Mother   . Hypertension Mother   . Diabetes Paternal Grandmother   . Breast cancer Sister   . Diabetes Maternal Uncle   . Heart disease Maternal Uncle   . Stroke Neg Hx     Social History: Social History   Social History  . Marital status: Married    Spouse name: [name]  . Number of children: 4  . Years of education: N/A   Occupational History  . bus operator    Social History Main Topics  . Smoking status: Former Smoker    Packs/day: 0.25    Years: 62.00    Types: Cigarettes    Quit date: 10/23/2015  . Smokeless tobacco: Former Systems developer    Quit date: 06/30/2013  . Alcohol use No  . Drug use: No  . Sexual activity: No   Other Topics Concern  . None   Social History Narrative   Married, 4 children.   Formerly a Medical illustrator.   Local transportation bus driver.  Level of education: HS.     +tobacco--lifelong (still smoking as of 08/2011).  No alcohol or  drugs.   No exercise.  +excessive caffeine.    Allergies:  Allergies  Allergen Reactions  . Morphine And Related Other (See Comments)    Drenched with perspiration  . Demerol [Meperidine] Nausea Only  . Starlix [Nateglinide] Other (See Comments)    gassy    Objective:    Vital Signs:   Temp:  [97.9 F (36.6 C)-98.4 F (36.9 C)] 98.4 F (36.9 C) (06/01 0925) Pulse Rate:  [63-141] 94 (06/01 0925) Resp:  [17-28] 21 (06/01 0925) BP: (107-147)/(55-89) 128/74 (06/01 0925) SpO2:  [87 %-96 %] 93 % (06/01 0925) Weight:  [252 lb 8 oz (114.5 kg)] 252 lb 8 oz (114.5 kg) (06/01 0445) Last BM Date: 06/02/16  Weight change: Filed Weights   06/04/16 1131 06/05/16 0445  Weight: 254 lb (115.2 kg) 252 lb 8 oz (114.5 kg)    Intake/Output:   Intake/Output Summary (Last 24 hours) at 06/05/16 1141 Last data filed at 06/05/16 1105  Gross per 24 hour  Intake              530 ml  Output             1750 ml  Net            -1220 ml     Physical Exam: General:  Dyspneic at rest. Sitting on the side of the bed.  HEENT: normal Neck: supple. JVP difficult to assess due . Carotids 2+ bilat; no bruits. No lymphadenopathy or thyromegaly appreciated. Cor: PMI nondisplaced. Irregular  rate & rhythm. No rubs, gallops or murmurs. Lungs: EW throughout on 4  liters  Abdomen: obese, soft, nontender, nondistended. No hepatosplenomegaly. No bruits or masses. Good bowel sounds. Extremities: no cyanosis, clubbing, rash, edema Neuro: alert & orientedx3, cranial nerves grossly intact. moves all 4 extremities w/o difficulty. Affect pleasant  Telemetry:  A fib 100s   Labs: Basic Metabolic Panel:  Recent Labs Lab 06/04/16 1212 06/04/16 1629 06/05/16 0234  NA 140  --  138  K 3.5  --  3.9  CL 103  --  102  CO2 24  --  25  GLUCOSE 160*  --  364*  BUN 23*  --  35*  CREATININE 1.07  --  1.46*  CALCIUM 9.1  --  8.7*  MG  --  2.1  --   PHOS  --  4.5  --     Liver Function Tests:  Recent Labs Lab  06/04/16 1212  AST 25  ALT 18  ALKPHOS 81  BILITOT 1.0  PROT 6.3*  ALBUMIN 3.4*   No results for input(s): LIPASE, AMYLASE in the last 168 hours. No results for input(s): AMMONIA in the last 168 hours.  CBC:  Recent Labs Lab 06/04/16 1212 06/05/16 0234  WBC 9.6 9.0  NEUTROABS 7.5  --   HGB 13.1 12.3*  HCT 42.6 40.3  MCV 83.9 83.6  PLT PLATELET CLUMPS NOTED ON SMEAR, COUNT APPEARS ADEQUATE 180    Cardiac Enzymes:  Recent Labs Lab 06/04/16 1629 06/04/16 2103 06/05/16 0234  TROPONINI 0.05* 0.04* 0.04*    BNP: BNP (last 3 results)  Recent Labs  10/23/15 1831 06/04/16 1155  BNP 47.7 462.5*    ProBNP (last 3 results) No results for input(s): PROBNP in the last 8760 hours.   CBG:  Recent Labs Lab 06/04/16 1737 06/04/16 2119 06/05/16 0613 06/05/16 1138  GLUCAP 231* 317* 303* 390*    Coagulation Studies:  Recent Labs  06/04/16 1212  LABPROT 16.1*  INR 1.28    Other results: EKG: Afib RVR 103   Imaging: Dg Chest Portable 1 View  Result Date: 06/04/2016 CLINICAL DATA:  Episode of shortness of breath 2-3 weeks ago without chest pain. One year history of intermittently productive cough. Discontinued smoking 8 months ago. History of diabetes and COPD. 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 EXAM: PORTABLE CHEST 1 VIEW COMPARISON:  CT scan of the chest of Jun 02, 2016 and chest x-ray of May 18, 2016. FINDINGS: The lungs are adequately inflated. The interstitial markings remain increased air and are slightly more conspicuous overall today. The cardiac silhouette is enlarged today in the pulmonary vascularity is more engorged. The patient has undergone previous CABG. There is calcification in the wall of the aortic arch. There are calcified pleural plaques bilaterally. The observed bony thorax exhibits no acute abnormality. IMPRESSION: Mild interval worsening in the appearance of the pulmonary interstitium which likely reflects CHF superimposed on known  chronic lung disease. There is no alveolar pneumonia. Electronically Signed   By: David  Martinique M.D.   On: 06/04/2016 12:51      Medications:     Current Medications: . albuterol  2.5 mg Nebulization BID  . aspirin EC  81 mg Oral QHS  . atorvastatin  40 mg Oral Daily  . azithromycin  500 mg Oral Q24H  . buPROPion  150 mg Oral BID  . citalopram  40 mg Oral Daily  . diltiazem  240 mg Oral Daily  . furosemide  80 mg Intravenous BID  . guaiFENesin  1,200 mg Oral BID  . insulin aspart  0-5  Units Subcutaneous QHS  . insulin aspart  0-9 Units Subcutaneous TID WC  . insulin glargine  50 Units Subcutaneous QHS  . isosorbide mononitrate  30 mg Oral Daily  . mouth rinse  15 mL Mouth Rinse BID  . methylPREDNISolone (SOLU-MEDROL) injection  40 mg Intravenous Q12H  . rivaroxaban  20 mg Oral Q supper  . sodium chloride flush  3 mL Intravenous Q12H  . sodium chloride flush  3 mL Intravenous Q12H  . tamsulosin  0.4 mg Oral QPC supper     Infusions: . sodium chloride    . cefTRIAXone (ROCEPHIN)  IV Stopped (06/04/16 2117)      Assessment/Plan   1. Acute/Chronic  Respiratory Failure that appears multifactorial given COPD/pneumonia/heart failure. Over the last few week he has been placed on 2 liters continuous oxygen. CT of chest 5/29 with mild bronchopnemonia. On steroids and antibiotics. Blood Cx pending. WBC today 9.  2. A/C Diastolic Heart Failure - Volume status difficult to assess with acute respiratory distress. CXR with CHF. Give 80 mg IV lasix now and followed by twice daily.  Place to PICC for CVP and CO-OX . If CVP low adjust diuretics.   Renal function trending up after IV lasix. Its possible he may have low output so follow CO-OX and consider inotropes if low.  3. A fib on admit- Rate better controlled today. Continue diltiazem and increase to 360 mg daily. Would not amio with COPD. Continue Xarelto. Once acute phase improves can consider cardioversion 4. AKI- creatinine  trending up 1.0> 1.46.  5. CAD H/O CABG 2015- stop aspirin on xarelto. Continue statin. Troponin flat. No CP 65. DMII- on insulin per Primary Team.   Length of Stay: [place], NP  06/05/2016, 11:41 AM  Advanced Heart Failure Team Pager [phone] (M-F; 7a - 4p)  Please contact [facility] for night-coverage after hours (4p -7a ) and weekends on [email or web address]  Patient seen with NP, agree with the above note.  1. Acute on chronic hypoxemic respiratory failure: CT chest 5/29 showed PNA, and patient now appears to have COPD exacerbation with wheezing.  However, he is also volume overloaded and in atrial fibrillation.  Suspect respiratory failure is multifactorial.  He does appear to be in mild distress.  - Lasix 80 mg IV x 1 now then Lasix 80 mg IV bid.  - Continue Solumedrol and give standing Duonebs (now).  - Continue abx for PNA.  - Would move to step down.  2. Acute on chronic diastolic CHF: No recent echo.  He does appear volume overloaded on exam with elevated JVP and pulmonary edema on CXR.  - Lasix 80 mg IV bid as above.  - Place PICC and follow CVP to keep better track on volume status given multifactorial respiratory failure.  - Echo to reassess EF.  3. AKI: Creatinine up to 1.46 today.  Follow closely with diuresis.  4. PNA: Noted on chest CT.  Cultures sent, WBCs not elevated.  On ceftriaxone/azithromycin . 5. Atrial fibrillation: History of PAF, has not missed Xarelto.  He is in atrial fibrillation currently with controlled rate.  - Given baseline COPD would avoid amiodarone use.  - Continue Xarelto.  - Rate-controlled with diltiazem CD.  Avoid beta blocker with wheezing. - If he remains in atrial fibrillation when COPD and volume overload controlled, could do DCCV.  6. CAD: s/p CABG.  Very mild troponin elevation with no trend, suspect demand ischemia.  No ASA given Xarelto use. Continue statin.  Loralie Champagne 06/05/2016 12:38 PM

## 2016-06-05 NOTE — Consult Note (Signed)
Duplicate note

## 2016-06-05 NOTE — Progress Notes (Signed)
The patient was transferred to 2 Heart after report called with rapid response Debbie and RT with BiPAP. Nurse at bedside and patient in stable condition.  Saddie Benders RN

## 2016-06-05 NOTE — Progress Notes (Signed)
Patient transferred to 3W R20, on a hospital bed along with his personal belongings.

## 2016-06-05 NOTE — Progress Notes (Signed)
   Afternoon round.   Upon entering the room he was SOB at rest sitting on the side of the bed with O2 sats in 70s on 3 liters of oxygen. Lips blue. BP 112/ 60 Crackles RML RLL LLL. A fib 110s.  A&O x3.   Bladder scan with ~100 cc noted.   Obtain stat ABG, place BIpap now, give 80 mg IV lasix now. Check BMET now. Will need foley.   On Bipap sats improved to 99%.   Move to ICU. CCM consulted.    Arbor Leer NP-C  4:30 PM

## 2016-06-05 NOTE — Evaluation (Signed)
Physical Therapy Evaluation Patient Details Name: Joshua [name] MRN: [id number] DOB: 07-Aug-1940 Today's Date: 06/05/2016   History of Present Illness  76 yo admitted with acute on chronic respiratory distress. PMHx: COPD, HTN, DM, HLD, obesity, CABG, CAD, CKD, HF  Clinical Impression  Pt very pleasant and moving well. Pt with decreased activity tolerance due to respiratory function with sats dropping to 87% on 3L with gait with 4L required to maintain 91% with activity. Pt with all necessary DME at home but reports relatively sedentary lifestyle due to fatigue. Pt with decreased activity, gait, strength and balance who will benefit from acute therapy to maximize mobility, function and independence.     Follow Up Recommendations Home health PT    Equipment Recommendations  None recommended by PT    Recommendations for Other Services       Precautions / Restrictions Precautions Precautions: Fall Precaution Comments: watch sats      Mobility  Bed Mobility Overal bed mobility: Modified Independent             General bed mobility comments: HOB 15 degrees  Transfers Overall transfer level: Modified independent                  Ambulation/Gait Ambulation/Gait assistance: Supervision Ambulation Distance (Feet): 120 Feet Assistive device: Rolling walker (2 wheeled) Gait Pattern/deviations: Step-through pattern;Decreased stride length   Gait velocity interpretation: Below normal speed for age/gender General Gait Details: pt with standing rest x 1 with cues for posture and breathing technique. Pt limited by 2-3/4 dyspnea with activity. Sats dropping to 87% on 3L, 91% on 4L  Stairs            Wheelchair Mobility    Modified Rankin (Stroke Patients Only)       Balance Overall balance assessment: Needs assistance   Sitting balance-Leahy Scale: Good       Standing balance-Leahy Scale: Fair                               Pertinent  Vitals/Pain Pain Assessment: No/denies pain    Home Living Family/patient expects to be discharged to:: Private residence Living Arrangements: Spouse/significant other Available Help at Discharge: Family;Available 24 hours/day Type of Home: House Home Access: Ramped entrance     Home Layout: One level Home Equipment: Walker - 4 wheels;Cane - single point;Shower seat;Grab bars - toilet;Grab bars - tub/shower;Hand held shower head;Adaptive equipment;Bedside commode;Other (comment) (adjustable bed without rails)      Prior Function Level of Independence: Independent with assistive device(s)         Comments: drives, cane inside, sits to shower, sock aid for L foot, rollator for community     Hand Dominance        Extremity/Trunk Assessment   Upper Extremity Assessment Upper Extremity Assessment: Generalized weakness    Lower Extremity Assessment Lower Extremity Assessment: Generalized weakness    Cervical / Trunk Assessment Cervical / Trunk Assessment: Normal (forward head)  Communication   Communication: HOH  Cognition Arousal/Alertness: Awake/alert Behavior During Therapy: WFL for tasks assessed/performed Overall Cognitive Status: Within Functional Limits for tasks assessed                                        General Comments      Exercises     Assessment/Plan    PT Assessment  Patient needs continued PT services  PT Problem List Decreased strength;Decreased mobility;Decreased activity tolerance;Cardiopulmonary status limiting activity;Decreased balance       PT Treatment Interventions Gait training;Therapeutic exercise;Patient/family education;Functional mobility training;Balance training;Therapeutic activities;DME instruction    PT Goals (Current goals can be found in the Care Plan section)  Acute Rehab PT Goals Patient Stated Goal: return home PT Goal Formulation: With patient Time For Goal Achievement: 06/19/16 Potential to  Achieve Goals: Good    Frequency Min 3X/week   Barriers to discharge Decreased caregiver support      Co-evaluation               AM-PAC PT "6 Clicks" Daily Activity  Outcome Measure Difficulty turning over in bed (including adjusting bedclothes, sheets and blankets)?: A Little Difficulty moving from lying on back to sitting on the side of the bed? : A Little Difficulty sitting down on and standing up from a chair with arms (e.g., wheelchair, bedside commode, etc,.)?: None Help needed moving to and from a bed to chair (including a wheelchair)?: None Help needed walking in hospital room?: None Help needed climbing 3-5 steps with a railing? : A Little 6 Click Score: 21    End of Session Equipment Utilized During Treatment: Gait belt;Oxygen Activity Tolerance: Patient tolerated treatment well Patient left: in chair;with call bell/phone within reach;with chair alarm set Nurse Communication: Mobility status PT Visit Diagnosis: Muscle weakness (generalized) (M62.81);Difficulty in walking, not elsewhere classified (R26.2)    Time: 9432-7614 PT Time Calculation (min) (ACUTE ONLY): 20 min   Charges:   PT Evaluation $PT Eval Moderate Complexity: 1 Procedure     PT G Codes:        Elwyn Reach, PT [phone]   [place] B Makailyn Mccormick 06/05/2016, 8:48 AM

## 2016-06-05 NOTE — Consult Note (Signed)
PULMONARY / CRITICAL CARE MEDICINE   Name: Joshua [name] MRN: [id number] DOB: 1940/04/15    ADMISSION DATE:  06/04/2016 CONSULTATION DATE:  06/05/2016  REFERRING MD:  Dr. Ree Kida  CHIEF COMPLAINT:  Dyspnea   HISTORY OF PRESENT ILLNESS:   76 year old male past smoker (3 packs per day for 40 years, reportedly stopped smoking in September 2017) with PMH of CAD s/p CABG, dCHF, DM2, PAD, A.Fib, COPD with chronic cough, asbestos exposure with pleural plaques, and suspected OSA.   Presented to ED on 5/31 with dyspnea. Patient had routine follow up CT scan on 5/28 which showed evidence of asbestos exposure and PNA, was started on Z-pack. Saw PCP on 5/31 with complaints of 2 weeks of dyspnea, increased swelling to lower extremity and increased oxygen requirements, was sent to ED for further work-up. Upon arrival patient was noted to be in respiratory distress was given 40 mg Lasix, solu-medrol, and a breathing treatment with improvement. BNP 462. Was admitted to telemetry unit under [facility].   On 6/1 patient went into respiratory distress and was noted to be mildly cyanotic, patient placed on BiPAP. Bp and HR stable with respirations on 28. Cardiology at bedside, patient given 120 Solu-medrol and 80 mg Lasix. ABG 7.296/50/74 after 10 minutes BiPAP. PCCM asked to consult.   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); Chronic diastolic heart failure ([place]) (2016); Chronic renal insufficiency, stage II (mild); COPD (chronic obstructive pulmonary disease) ([place]); Diabetic nephropathy ([place]); DM type 2 (diabetes mellitus, type 2) ([place]); DOE (dyspnea on exertion); Erectile dysfunction; Hearing loss of both ears (2016); Hyperlipidemia; Hyperplastic colon polyp (2001); Hypertension; Iron deficiency anemia (2014); Macular degeneration, dry; Obesity;  Open toe wound (11/2015); Other and unspecified angina pectoris; PAD (peripheral artery disease) ([place]) (02/2016); PAF (paroxysmal atrial fibrillation) ([place]) (10/2014); Pericardial effusion with cardiac tamponade (6//29/15); Pleural plaque; Pneumonia; and Tobacco dependence in remission.  PAST SURGICAL HISTORY: He  has a past surgical history that includes Cholecystectomy open (1999); Appendectomy (1957); Hip surgery (Right, 1954); Patella fracture surgery (Left, ~ 1979); Testicle surgery (as a child); Tonsillectomy (1947); Colonoscopy (10/16/2011); Cataract extraction w/ intraocular lens  implant, bilateral (04/08/2006 & 04/22/2006); Esophagogastroduodenoscopy (02/25/12); Total hip arthroplasty (Right, 08/12/2012); Hardware Removal (Right, 08/12/2012); Cardiac catheterization (08/08/2013); Cataract extraction w/ intraocular lens  implant, bilateral (Bilateral); Coronary artery bypass graft (N/A, 08/14/2013); Intraoprative transesophageal echocardiogram (N/A, 08/14/2013); pericardial tap (N/A, 07/01/2013); left heart catheterization with coronary angiogram (N/A, 08/08/2013); and transthoracic echocardiogram (10/30/14).  Allergies  Allergen Reactions  . Morphine And Related Other (See Comments)    Drenched with perspiration  . Demerol [Meperidine] Nausea Only  . Starlix [Nateglinide] Other (See Comments)    gassy    No current facility-administered medications on file prior to encounter.    Current Outpatient Prescriptions on File Prior to Encounter  Medication Sig  . acetaminophen (TYLENOL) 325 MG tablet Take 2 tablets (650 mg total) by mouth every 4 (four) hours as needed for headache or mild pain.  [name] amLODipine (NORVASC) 5 MG tablet Take 1 tablet (5 mg total) by mouth daily.  . Ascorbic Acid (VITAMIN C) 1000 MG tablet Take 1,000 mg by mouth daily.  [name] aspirin 81 MG tablet Take 81 mg by mouth at bedtime. Reported on 02/26/2015  . atorvastatin (LIPITOR) 40 MG tablet TAKE ONE TABLET BY MOUTH EVERY DAY  .  azithromycin (ZITHROMAX) 250 MG  tablet Take 1 tablet once daily for 6 days.  . budesonide-formoterol (SYMBICORT) 160-4.5 MCG/ACT inhaler Inhale 2 puffs into the lungs 2 (two) times daily.  [name] buPROPion (WELLBUTRIN SR) 150 MG 12 hr tablet TAKE ONE TABLET BY MOUTH TWICE DAILY  . Cholecalciferol (VITAMIN D-3) 1000 units CAPS Take 1,000 Units by mouth daily. Reported on 02/26/2015  . Chromium Picolinate 1000 MCG TABS Take 1,000 mcg by mouth daily. Reported on 02/26/2015  . citalopram (CELEXA) 40 MG tablet Take 1 tablet (40 mg total) by mouth daily.  [name] diltiazem (CARDIZEM CD) 240 MG 24 hr capsule Take 1 capsule (240 mg total) by mouth daily.  . fluticasone (FLONASE) 50 MCG/ACT nasal spray Place 2 sprays into both nostrils daily.  . furosemide (LASIX) 40 MG tablet Take 40 mg by mouth daily.  [name] gabapentin (NEURONTIN) 100 MG capsule TAKE 2 CAPSULES BY MOUTH 3 TIMES DAILY (Patient taking differently: take 2 capsules in the morning and 3 capsules at bedtime)  . guaiFENesin (MUCINEX) 600 MG 12 hr tablet Take 2 tablets (1,200 mg total) by mouth 2 (two) times daily.  . Insulin Glargine (TOUJEO SOLOSTAR) 300 UNIT/ML SOPN Inject 70 Units into the skin daily. (Patient taking differently: Inject 60 Units into the skin daily. )  . insulin regular (NOVOLIN R,HUMULIN R) 100 units/mL injection Inject 16 units SQ with BF, 17 units SQ with Lunch, and 18 U SQ with Supper (Patient taking differently: 28 units in the am and at lunch and 28 at bedtime)  . isosorbide mononitrate (IMDUR) 30 MG 24 hr tablet TAKE ONE TABLET BY MOUTH DAILY  . Multiple Vitamin (MULTIVITAMIN WITH MINERALS) TABS Take 1 tablet by mouth daily.  . nitroGLYCERIN (NITROSTAT) 0.4 MG SL tablet Place 1 tablet (0.4 mg total) under the tongue every 5 (five) minutes as needed for chest pain.  . pioglitazone (ACTOS) 45 MG tablet Take 45 mg by mouth daily. Reported on 02/26/2015  . rivaroxaban (XARELTO) 20 MG TABS tablet Take 1 tablet (20 mg total) by mouth daily  with supper.  [name] SYNJARDY 05-998 MG TABS TAKE ONE TABLET BY MOUTH TWICE DAILY AFTER A MEAL  . tamsulosin (FLOMAX) 0.4 MG CAPS capsule TAKE 1 CAPSULE BY MOUTH EVERY DAY AFTER SUPPER    FAMILY HISTORY:  His indicated that his mother is deceased. He indicated that his father is deceased. He indicated that the status of his sister is unknown. He indicated that his maternal grandmother is deceased. He indicated that his maternal grandfather is deceased. He indicated that his paternal grandmother is deceased. He indicated that his paternal grandfather is deceased. He indicated that the status of his neg hx is unknown.    SOCIAL HISTORY: He  reports that he quit smoking about 7 months ago. His smoking use included Cigarettes. He has a 15.50 pack-year smoking history. He quit smokeless tobacco use about 2 years ago. He reports that he does not drink alcohol or use drugs.  REVIEW OF SYSTEMS:   All negative; except for those that are bolded, which indicate positives.  Constitutional: weight loss, weight gain, night sweats, fevers, chills, fatigue, weakness.  HEENT: headaches, sore throat, sneezing, nasal congestion, post nasal drip, difficulty swallowing, tooth/dental problems, visual complaints, visual changes, ear aches. Neuro: difficulty with speech, weakness, numbness, ataxia. CV:  chest pain, orthopnea, PND, swelling in lower extremities, dizziness, palpitations, syncope.  Resp: cough, hemoptysis, dyspnea, wheezing. GI: heartburn, indigestion, abdominal pain, nausea, vomiting, diarrhea, constipation, change in bowel habits, loss of appetite, hematemesis, melena, hematochezia.  GU: dysuria, change in color of urine, urgency or frequency, flank pain, hematuria. MSK: joint pain or swelling, decreased range of motion. Psych: change in mood or affect, depression, anxiety, suicidal ideations, homicidal ideations. Skin: rash, itching, bruising.   SUBJECTIVE:  States improved SOB on BiPAP. Denies pain.    VITAL SIGNS: BP 112/66   Pulse (!) 102   Temp 98.2 F (36.8 C) (Oral)   Resp (!) 28   Ht _0  (1.803 m)   Wt 114.5 kg (252 lb 8 oz)   SpO2 90%   BMI 35.22 kg/m   HEMODYNAMICS:    VENTILATOR SETTINGS:    INTAKE / OUTPUT: I/O last 3 completed shifts: In: 290 [P.O.:240; IV Piggyback:50] Out: 1100 [Urine:1100]  PHYSICAL EXAMINATION: General:  Adult male, mild respiratory distress  Neuro:  Alert, oriented, follows commands  HEENT:  Normocephalic  Cardiovascular:  Irregular, Tachy, no MRG Lungs:  Exp Wheeze, Bibasilar Crackles, non-labored  Abdomen:  Obese, active bowel sounds  Musculoskeletal:  +1 edema to BLE Skin:  Warm, dry, intact   LABS:  BMET  Recent Labs Lab 06/04/16 1212 06/05/16 0234  NA 140 138  K 3.5 3.9  CL 103 102  CO2 24 25  BUN 23* 35*  CREATININE 1.07 1.46*  GLUCOSE 160* 364*    Electrolytes  Recent Labs Lab 06/04/16 1212 06/04/16 1629 06/05/16 0234  CALCIUM 9.1  --  8.7*  MG  --  2.1  --   PHOS  --  4.5  --     CBC  Recent Labs Lab 06/04/16 1212 06/05/16 0234  WBC 9.6 9.0  HGB 13.1 12.3*  HCT 42.6 40.3  PLT PLATELET CLUMPS NOTED ON SMEAR, COUNT APPEARS ADEQUATE 180    Coag's  Recent Labs Lab 06/04/16 1212  INR 1.28    Sepsis Markers  Recent Labs Lab 06/04/16 1242 06/04/16 1654  LATICACIDVEN 2.64* 2.52*    ABG No results for input(s): PHART, PCO2ART, PO2ART in the last 168 hours.  Liver Enzymes  Recent Labs Lab 06/04/16 1212  AST 25  ALT 18  ALKPHOS 81  BILITOT 1.0  ALBUMIN 3.4*    Cardiac Enzymes  Recent Labs Lab 06/04/16 1629 06/04/16 2103 06/05/16 0234  TROPONINI 0.05* 0.04* 0.04*    Glucose  Recent Labs Lab 06/04/16 1737 06/04/16 2119 06/05/16 0613 06/05/16 1138  GLUCAP 231* 317* 303* 390*    Imaging No results found.   STUDIES:  5/29 HRCT chest > mild patchy peribronchovascular consolidation in RUL, mild patchy ground-glass bilateral suggestive of mild  bronchopneumonia;  bilateral calcified pleural plaques; small right, trace left dependent pleural effusions c/w asbestos related pleural disease; mild patchy subpleural reticulation and parenchymal banding throughout both lungs, mildly increased c/w asbestosis; aortic atherosclerosis, left main and 3 vessel coronary atherosclerosis s/p CABG; stable dilated main PA suggestive of chronic PAH, stable mild mediastinal lymphadenopathy 6/1 CXR >>  CULTURES: 5/31 BCx 2 > 5/31 [place] >  ANTIBIOTICS: Azithromycin 5/31 > Rocephin 6/1 >  SIGNIFICANT EVENTS: 5/31 Admit 6/1 Tx to ICU   LINES/TUBES: PIV   DISCUSSION: 76 year old presents with pulmonary edema +/-AECOPD.   ASSESSMENT / PLAN:  PULMONARY A: Acute hypoxic respiratory distress- in the setting of PNA, +/- AoC COPD exacerbation and +/- pulmonary edema  CAP Acute respiratory acidosis  COPD GOLD D- on Symbicort and prn albuterol at home  ? Chronic bilateral small pleural effusions Former smoker  P:   Continue BiPAP Wean for sats for 90-92%  Continue solumedrol 65m  q 12 Start Brovana and pulmicort BID Duonebs q 6h and prn albuterol q 2h  CXR now CXR and ABG as needed Diuresis as below abx as below Follow cultures   CARDIOVASCULAR A:  PAF - xarelto Diastolic Heart Failure Mild troponin leak - peak 0.05 Hx HTN, HLD P:  Cardiac Monitoring  HF team consulting Pending PICC placement  Management per HF team  Await TTE Continue xarelto and cardizem   RENAL A:   AKI  Hx CKD stage II P:   Diuresis per HF team - lasix 3m BID Trend BMP / urinary output Replace electrolytes as indicated Avoid nephrotoxic agents, ensure adequate renal perfusion  GASTROINTESTINAL A:   No acute issues P:   PPI for SUP NPO while on BiPAP  HEMATOLOGIC A:   Hx IDA- stable Hgb P:  Trend CBC SCDs   INFECTIOUS A:   CAP  P:   Trend WBC and Fever Curve Follow Culture Data  Coverage with rocephin and  zithro  ENDOCRINE A:   Hyperglycemia  Hx DM   P:   Insulin gtt Trend Glucose   NEUROLOGIC A:   Depression  P:   Ongoing monitoring  Minimize sedation   FAMILY  - Updates: Wife and family updated at beside. Wife stated that her and husband have living wills. Patient wishes to not undergo CPR, is okay with short term ventilation if needed.   - Inter-disciplinary family meet or Palliative Care meeting due by:  06/12/2016  CC Time: 522minutes   KHayden Pedro AGACNP-BC [place] Pgr: [phone] PCCM Pgr: [phone]

## 2016-06-05 NOTE — Progress Notes (Signed)
Inpatient Diabetes Program Recommendations  AACE/ADA: New Consensus Statement on Inpatient Glycemic Control (2015)  Target Ranges:  Prepandial:   less than 140 mg/dL      Peak postprandial:   less than 180 mg/dL (1-2 hours)      Critically ill patients:  140 - 180 mg/dL   Lab Results  Component Value Date   GLUCAP 303 (H) 06/05/2016   HGBA1C 7.3 (H) 06/04/2016    Review of Glycemic Control  Diabetes history: DM2 Outpatient Diabetes medications: INSULIN regimen is: Toujeo 70 units in a.m., Novolin R 28 units before meals tid Non-insulin hypoglycemic drugs the patient is taking are: Actos 45 mg daily, Synjardy 05/998 twice a day Current orders for Inpatient glycemic control: Lantus 50 units qd +Novolog 0-9 units tid + 0-5 units hs  Inpatient Diabetes Program Recommendations:    Noted elevated hyperglycemia.  Please consider: -Novolog 5-8 units tid meal coverage if eats 50% -Increase Novolog correction to 0-15 units tid  Noted sees Dr. Dwyane Dee and last office visit was 05/06/16  Thank you, Nani Gasser. Azaria Stegman, RN, MSN, CDE  Diabetes Coordinator Inpatient Glycemic Control Team Team Pager [phone] (8am-5pm) 06/05/2016 9:50 AM

## 2016-06-05 NOTE — Progress Notes (Signed)
Report called to receiving nurse on 3MW, patient eating lunch and will transport him to 3MW09afterwards.

## 2016-06-06 ENCOUNTER — Inpatient Hospital Stay (HOSPITAL_COMMUNITY): Payer: Medicare Other

## 2016-06-06 ENCOUNTER — Other Ambulatory Visit (HOSPITAL_COMMUNITY): Payer: Medicare Other

## 2016-06-06 DIAGNOSIS — J9601 Acute respiratory failure with hypoxia: Secondary | ICD-10-CM

## 2016-06-06 DIAGNOSIS — I5031 Acute diastolic (congestive) heart failure: Secondary | ICD-10-CM

## 2016-06-06 DIAGNOSIS — I340 Nonrheumatic mitral (valve) insufficiency: Secondary | ICD-10-CM

## 2016-06-06 DIAGNOSIS — I2510 Atherosclerotic heart disease of native coronary artery without angina pectoris: Secondary | ICD-10-CM

## 2016-06-06 DIAGNOSIS — I5033 Acute on chronic diastolic (congestive) heart failure: Secondary | ICD-10-CM

## 2016-06-06 LAB — CBC
HEMATOCRIT: 42.2 % (ref 39.0–52.0)
HEMOGLOBIN: 12.8 g/dL — AB (ref 13.0–17.0)
MCH: 25.5 pg — AB (ref 26.0–34.0)
MCHC: 30.3 g/dL (ref 30.0–36.0)
MCV: 84.1 fL (ref 78.0–100.0)
Platelets: 216 10*3/uL (ref 150–400)
RBC: 5.02 MIL/uL (ref 4.22–5.81)
RDW: 16.6 % — ABNORMAL HIGH (ref 11.5–15.5)
WBC: 15.7 10*3/uL — ABNORMAL HIGH (ref 4.0–10.5)

## 2016-06-06 LAB — BASIC METABOLIC PANEL
Anion gap: 15 (ref 5–15)
BUN: 55 mg/dL — AB (ref 6–20)
CHLORIDE: 99 mmol/L — AB (ref 101–111)
CO2: 23 mmol/L (ref 22–32)
CREATININE: 1.6 mg/dL — AB (ref 0.61–1.24)
Calcium: 8.2 mg/dL — ABNORMAL LOW (ref 8.9–10.3)
GFR calc Af Amer: 46 mL/min — ABNORMAL LOW (ref >60)
GFR calc non Af Amer: 40 mL/min — ABNORMAL LOW (ref >60)
Glucose, Bld: 164 mg/dL — ABNORMAL HIGH (ref 65–99)
Potassium: 3.6 mmol/L (ref 3.5–5.1)
Sodium: 137 mmol/L (ref 135–145)

## 2016-06-06 LAB — GLUCOSE, CAPILLARY
GLUCOSE-CAPILLARY: 133 mg/dL — AB (ref 65–99)
GLUCOSE-CAPILLARY: 134 mg/dL — AB (ref 65–99)
GLUCOSE-CAPILLARY: 185 mg/dL — AB (ref 65–99)
GLUCOSE-CAPILLARY: 164 mg/dL — AB (ref 65–99)
GLUCOSE-CAPILLARY: 159 mg/dL — AB (ref 65–99)
GLUCOSE-CAPILLARY: 351 mg/dL — AB (ref 65–99)
Glucose-Capillary: 188 mg/dL — ABNORMAL HIGH (ref 65–99)
Glucose-Capillary: 133 mg/dL — ABNORMAL HIGH (ref 65–99)
Glucose-Capillary: 138 mg/dL — ABNORMAL HIGH (ref 65–99)
Glucose-Capillary: 124 mg/dL — ABNORMAL HIGH (ref 65–99)
Glucose-Capillary: 353 mg/dL — ABNORMAL HIGH (ref 65–99)
Glucose-Capillary: 176 mg/dL — ABNORMAL HIGH (ref 65–99)

## 2016-06-06 LAB — ECHOCARDIOGRAM COMPLETE
HEIGHTINCHES: 72 in
WEIGHTICAEL: 4007.08 [oz_av]

## 2016-06-06 MED ORDER — CHLORHEXIDINE GLUCONATE CLOTH 2 % EX PADS
6.0000 | MEDICATED_PAD | Freq: Every day | CUTANEOUS | Status: DC
  Administered 2016-06-07 – 2016-06-11 (×4): 6 via TOPICAL

## 2016-06-06 MED ORDER — SODIUM CHLORIDE 0.9% FLUSH: 10.0000 mL | INTRAVENOUS | Status: DC | PRN

## 2016-06-06 MED ORDER — INSULIN GLARGINE 100 UNIT/ML ~~LOC~~ SOLN
70.0000 [IU] | Freq: Every day | SUBCUTANEOUS | Status: DC
  Administered 2016-06-06: 70 [IU] via SUBCUTANEOUS
  Filled 2016-06-06: qty 0.7

## 2016-06-06 MED ORDER — METHYLPREDNISOLONE SODIUM SUCC 40 MG IJ SOLR
20.0000 mg | Freq: Every day | INTRAMUSCULAR | Status: DC
  Administered 2016-06-07 – 2016-06-08 (×2): 20 mg via INTRAVENOUS
  Filled 2016-06-06 (×2): qty 1

## 2016-06-06 MED ORDER — METOLAZONE 2.5 MG PO TABS
2.5000 mg | ORAL_TABLET | Freq: Once | ORAL | Status: AC
Start: 2016-06-06 — End: 2016-06-06
  Administered 2016-06-06: 2.5 mg via ORAL
  Filled 2016-06-06: qty 1

## 2016-06-06 MED ORDER — INSULIN ASPART 100 UNIT/ML ~~LOC~~ SOLN
0.0000 [IU] | Freq: Every day | SUBCUTANEOUS | Status: DC
  Administered 2016-06-07: 3 [IU] via SUBCUTANEOUS
  Administered 2016-06-08 – 2016-06-09 (×2): 4 [IU] via SUBCUTANEOUS
  Administered 2016-06-10: 5 [IU] via SUBCUTANEOUS

## 2016-06-06 MED ORDER — INSULIN GLARGINE 100 UNIT/ML ~~LOC~~ SOLN: 70.0000 [IU] | Freq: Every day | SUBCUTANEOUS | Status: DC

## 2016-06-06 MED ORDER — INSULIN ASPART 100 UNIT/ML ~~LOC~~ SOLN
0.0000 [IU] | Freq: Three times a day (TID) | SUBCUTANEOUS | Status: DC
  Administered 2016-06-06: 20 [IU] via SUBCUTANEOUS
  Administered 2016-06-06: 4 [IU] via SUBCUTANEOUS
  Administered 2016-06-07: 7 [IU] via SUBCUTANEOUS
  Administered 2016-06-07: 11 [IU] via SUBCUTANEOUS
  Administered 2016-06-07: 15 [IU] via SUBCUTANEOUS
  Administered 2016-06-08: 20 [IU] via SUBCUTANEOUS
  Administered 2016-06-08 (×2): 4 [IU] via SUBCUTANEOUS
  Administered 2016-06-09 (×3): 11 [IU] via SUBCUTANEOUS
  Administered 2016-06-10: 15 [IU] via SUBCUTANEOUS
  Administered 2016-06-10: 3 [IU] via SUBCUTANEOUS
  Administered 2016-06-11: 7 [IU] via SUBCUTANEOUS
  Administered 2016-06-11: 3 [IU] via SUBCUTANEOUS

## 2016-06-06 MED ORDER — ORAL CARE MOUTH RINSE
15.0000 mL | Freq: Two times a day (BID) | OROMUCOSAL | Status: DC
  Administered 2016-06-06 – 2016-06-11 (×7): 15 mL via OROMUCOSAL

## 2016-06-06 MED ORDER — SODIUM CHLORIDE 0.9% FLUSH
10.0000 mL | Freq: Two times a day (BID) | INTRAVENOUS | Status: DC
  Administered 2016-06-06: 20 mL
  Administered 2016-06-06 – 2016-06-07 (×2): 10 mL
  Administered 2016-06-07 – 2016-06-08 (×2): 20 mL
  Administered 2016-06-08: 10 mL
  Administered 2016-06-09 (×2): 20 mL

## 2016-06-06 NOTE — Progress Notes (Signed)
PULMONARY / CRITICAL CARE MEDICINE   Name: Joshua [name] MRN: [id number] DOB: [birth year]    ADMISSION DATE:  06/04/2016 CONSULTATION DATE:  06/05/2016  REFERRING MD:  Dr. Ree Kida  CHIEF COMPLAINT:  Dyspnea   HISTORY OF PRESENT ILLNESS:   76 y/o male with an extensive past medical history including CAD, diastolic heart failure, and COPD was admited on 5/31 with dyspnea.  PCCM consulted for worsening respiratory failure on 6/1 requiring BIPAP and transfer to the ICU.   SUBJECTIVE:  Breathing better  Thirsty Had an episode of hypoxemia when the BIPAP mask came off Hyperglycemic, on insulin gtt  VITAL SIGNS: BP (!) 149/74   Pulse (!) 101   Temp 98.2 F (36.8 C) (Axillary)   Resp (!) 21   Ht 6' (1.829 m)   Wt 113.6 kg (250 lb 7.1 oz)   SpO2 97%   BMI 33.97 kg/m   HEMODYNAMICS:    VENTILATOR SETTINGS: FiO2 (%):  [40 %-80 %] 60 %  INTAKE / OUTPUT: I/O last 3 completed shifts: In: 1209.1 [P.O.:960; I.V.:149.1; IV Piggyback:100] Out: 2890 [Urine:2890]  PHYSICAL EXAMINATION: General:  Resting comfortably in bed HENT: NCAT BIPAP mask in place PULM: Crackles bases L > R, no wheezing, normal effort CV: Irreg irreg, tachy, no mgr GI: BS+, soft, nontender MSK: normal bulk and tone Neuro: awake, alert, no distress, MAEW    LABS:  BMET  Recent Labs Lab 06/05/16 0234 06/05/16 1913 06/06/16 0704  NA 138 134* 137  K 3.9 3.8 3.6  CL 102 98* 99*  CO2 _0 BUN 35* 49* 55*  CREATININE 1.46* 1.61* 1.60*  GLUCOSE 364* 417* 164*    Electrolytes  Recent Labs Lab 06/04/16 1629 06/05/16 0234 06/05/16 1913 06/06/16 0704  CALCIUM  --  8.7* 8.3* 8.2*  MG 2.1  --   --   --   PHOS 4.5  --   --   --     CBC  Recent Labs Lab 06/04/16 1212 06/05/16 0234 06/06/16 0704  WBC 9.6 9.0 15.7*  HGB 13.1 12.3* 12.8*  HCT 42.6 40.3 42.2  PLT PLATELET CLUMPS NOTED ON SMEAR, COUNT APPEARS ADEQUATE 180 216    Coag's  Recent Labs Lab 06/04/16 1212  INR 1.28     Sepsis Markers  Recent Labs Lab 06/04/16 1242 06/04/16 1654  LATICACIDVEN 2.64* 2.52*    ABG  Recent Labs Lab 06/05/16 1638  PHART 7.296*  PCO2ART 50.9*  PO2ART 74.3*    Liver Enzymes  Recent Labs Lab 06/04/16 1212  AST 25  ALT 18  ALKPHOS 81  BILITOT 1.0  ALBUMIN 3.4*    Cardiac Enzymes  Recent Labs Lab 06/04/16 1629 06/04/16 2103 06/05/16 0234  TROPONINI 0.05* 0.04* 0.04*    Glucose  Recent Labs Lab 06/06/16 0153 06/06/16 0254 06/06/16 0354 06/06/16 0442 06/06/16 0554 06/06/16 0648  GLUCAP 133* 138* 133* 124* 134* 185*    Imaging Dg Chest Port 1 View  Result Date: 06/05/2016 CLINICAL DATA:  Hypoxia. EXAM: PORTABLE CHEST 1 VIEW COMPARISON:  Single-view of the chest 06/04/2016. PA and lateral chest 05/18/2016. CT chest 06/02/2016. FINDINGS: Cardiomegaly and mild interstitial edema do not appear notably changed since the most recent study. The patient is status post CABG. No pneumothorax or pleural effusion. IMPRESSION: No change in cardiomegaly and mild interstitial edema. Electronically Signed   By: Inge Rise M.D.   On: 06/05/2016 17:19     STUDIES:  5/29 HRCT chest > mild patchy peribronchovascular consolidation in  RUL, mild patchy ground-glass bilateral suggestive of mild bronchopneumonia;  bilateral calcified pleural plaques; small right, trace left dependent pleural effusions c/w asbestos related pleural disease; mild patchy subpleural reticulation and parenchymal banding throughout both lungs, mildly increased c/w asbestosis; aortic atherosclerosis, left main and 3 vessel coronary atherosclerosis s/p CABG; stable dilated main PA suggestive of chronic PAH, stable mild mediastinal lymphadenopathy 6/1 CXR >> no change in bilateral interstitial edema and emphysema and cardiomegally  CULTURES: 5/31 BCx 2 > 5/31 Sputum Culture >  ANTIBIOTICS: Azithromycin 5/31 > Rocephin 6/1 > 6/1  SIGNIFICANT EVENTS: 5/31 Admit 6/1 Tx to ICU    LINES/TUBES: PIV   DISCUSSION: 76 year old presents with pulmonary edema +/-AECOPD causing acute respiratory failure with hypoxemia.  ASSESSMENT / PLAN:  PULMONARY A: Acute hypoxic respiratory failure COPD exacerbation> greatly improved Acute pulmonary edema  P:   Wean solumedrol Continue brovana and pulmicort Continue scheduled duoneb Continue albuterol prn   CARDIOVASCULAR A:  PAF - xarelto Diastolic Heart Failure Mild troponin leak - peak 0.05 Hx HTN, HLD P:  Cardiac Monitoring  Would recommend continue diuresis today  Pending PICC placement   Management per HF team  Await TTE Continue xarelto and cardizem   RENAL A:   Chronic kidney disease P:   Monitor BMET and UOP Replace electrolytes as needed Renal dose meds Diuresis per HF team  GASTROINTESTINAL A:   No acute issues P:   PPI for SUP Card prudent diet  HEMATOLOGIC A:   Hx IDA- stable Hgb P:  Trend CBC Continue rivaroxaban  INFECTIOUS A:   No acute issues Doubt CAP P:   Stop rocephin given hyperglycemia Continue azithromycin through tomorrow Follow temp/CBC  ENDOCRINE A:   Hyperglycemia  Hx DM  2> brittle, very insulin resistant P:   Stop insulin gtt Continue insulin, increase night time dosing Change SSI to resistant scale AC/HS  NEUROLOGIC A:   No acute issues P:   Ongoing monitoring  Minimize sedation   FAMILY  - Updates: daughter updated bedside; code status is no shocks but intubation for short term is OK  - Inter-disciplinary family meet or Palliative Care meeting due by:  06/12/2016  CC Time: 35 minutes  Roselie Awkward, MD [place] PCCM Pager: [phone] Cell: [phone] After 3pm or if no response, call [phone]

## 2016-06-06 NOTE — Plan of Care (Signed)
Problem: Fluid Volume: Goal: Ability to maintain a balanced intake and output will improve Outcome: Progressing Pt with spontaneously diuresing, fluid balance negative. Decrease dyspnea on ordered BIPAP with minimal wheezing through night.

## 2016-06-06 NOTE — Progress Notes (Signed)
Peripherally Inserted Central Catheter/Midline Placement  The IV Nurse has discussed with the patient and/or persons authorized to consent for the patient, the purpose of this procedure and the potential benefits and risks involved with this procedure.  The benefits include less needle sticks, lab draws from the catheter, and the patient may be discharged home with the catheter. Risks include, but not limited to, infection, bleeding, blood clot (thrombus formation), and puncture of an artery; nerve damage and irregular heartbeat and possibility to perform a PICC exchange if needed/ordered by physician.  Alternatives to this procedure were also discussed.  Bard Power PICC patient education guide, fact sheet on infection prevention and patient information card has been provided to patient /or left at bedside.    PICC/Midline Placement Documentation        Synthia Innocent 06/06/2016, 10:42 AM

## 2016-06-06 NOTE — Progress Notes (Signed)
  Echocardiogram 2D Echocardiogram has been performed.  Donata Clay 06/06/2016, 4:32 PM

## 2016-06-06 NOTE — Progress Notes (Signed)
Advanced Heart Failure Rounding Note   Subjective:    Developed respiratory failure yesterday due to combination COPD and HF. Started on Bipap and moved to ICU.    Breathing some better. Now on [place]. Mild diuresis overnight but weight up a pound. Converted to NSR. Wants to eat.   On nebs/steroids/abx and IV lasix.  PICC pending.   Objective:   Weight Range:  Vital Signs:   Temp:  [98 F (36.7 C)-98.4 F (36.9 C)] 98.2 F (36.8 C) (06/02 0700) Pulse Rate:  [74-102] 101 (06/02 0700) Resp:  [16-30] 21 (06/02 0700) BP: (107-159)/(51-86) 149/74 (06/02 0700) SpO2:  [79 %-100 %] 96 % (06/02 0931) FiO2 (%):  [40 %-80 %] 60 % (06/02 0700) Weight:  [113.3 kg (249 lb 12.5 oz)-113.6 kg (250 lb 7.1 oz)] 113.6 kg (250 lb 7.1 oz) (06/02 0400) Last BM Date: 06/02/16  Weight change: Filed Weights   06/05/16 0445 06/05/16 1700 06/06/16 0400  Weight: 114.5 kg (252 lb 8 oz) 113.3 kg (249 lb 12.5 oz) 113.6 kg (250 lb 7.1 oz)    Intake/Output:   Intake/Output Summary (Last 24 hours) at 06/06/16 0936 Last data filed at 06/06/16 0700  Gross per 24 hour  Intake            919.1 ml  Output             1590 ml  Net           -670.9 ml     Physical Exam: General:  Sitting straight up in bed getting treatment.  HEENT: normal Neck: supple. JVP hard to see. looks 8-9. Carotids 2+ bilat; no bruits. No lymphadenopathy or thryomegaly appreciated. Cor: PMI nonpalpable. Distant. Regular. No obvious murmurs Lungs: Barrel chested. Decreased BS throughout. Mild wheezing Abdomen: obese soft, nontender, +distended. No hepatosplenomegaly. No bruits or masses. Good bowel sounds. Extremities: no cyanosis, clubbing, rash, trace edema Neuro: alert & orientedx3, cranial nerves grossly intact. moves all 4 extremities w/o difficulty. Affect pleasant  Telemetry: NSR 80-90s +PVCs  Labs: Basic Metabolic Panel:  Recent Labs Lab 06/04/16 1212 06/04/16 1629 06/05/16 0234 06/05/16 1913 06/06/16 0704    NA 140  --  138 134* 137  K 3.5  --  3.9 3.8 3.6  CL 103  --  102 98* 99*  CO2 24  --  _0 GLUCOSE 160*  --  364* 417* 164*  BUN 23*  --  35* 49* 55*  CREATININE 1.07  --  1.46* 1.61* 1.60*  CALCIUM 9.1  --  8.7* 8.3* 8.2*  MG  --  2.1  --   --   --   PHOS  --  4.5  --   --   --     Liver Function Tests:  Recent Labs Lab 06/04/16 1212  AST 25  ALT 18  ALKPHOS 81  BILITOT 1.0  PROT 6.3*  ALBUMIN 3.4*   No results for input(s): LIPASE, AMYLASE in the last 168 hours. No results for input(s): AMMONIA in the last 168 hours.  CBC:  Recent Labs Lab 06/04/16 1212 06/05/16 0234 06/06/16 0704  WBC 9.6 9.0 15.7*  NEUTROABS 7.5  --   --   HGB 13.1 12.3* 12.8*  HCT 42.6 40.3 42.2  MCV 83.9 83.6 84.1  PLT PLATELET CLUMPS NOTED ON SMEAR, COUNT APPEARS ADEQUATE 180 216    Cardiac Enzymes:  Recent Labs Lab 06/04/16 1629 06/04/16 2103 06/05/16 0234  TROPONINI 0.05* 0.04* 0.04*  BNP: BNP (last 3 results)  Recent Labs  10/23/15 1831 06/04/16 1155  BNP 47.7 462.5*    ProBNP (last 3 results) No results for input(s): PROBNP in the last 8760 hours.    Other results:  Imaging: Dg Chest Port 1 View  Result Date: 06/05/2016 CLINICAL DATA:  Hypoxia. EXAM: PORTABLE CHEST 1 VIEW COMPARISON:  Single-view of the chest 06/04/2016. PA and lateral chest 05/18/2016. CT chest 06/02/2016. FINDINGS: Cardiomegaly and mild interstitial edema do not appear notably changed since the most recent study. The patient is status post CABG. No pneumothorax or pleural effusion. IMPRESSION: No change in cardiomegaly and mild interstitial edema. Electronically Signed   By: Inge Rise M.D.   On: 06/05/2016 17:19   Dg Chest Portable 1 View  Result Date: 06/04/2016 CLINICAL DATA:  Episode of shortness of breath 2-3 weeks ago without chest pain. One year history of intermittently productive cough. Discontinued smoking 8 months ago. History of diabetes and COPD. 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 EXAM: PORTABLE CHEST 1 VIEW COMPARISON:  CT scan of the chest of Jun 02, 2016 and chest x-ray of May 18, 2016. FINDINGS: The lungs are adequately inflated. The interstitial markings remain increased air and are slightly more conspicuous overall today. The cardiac silhouette is enlarged today in the pulmonary vascularity is more engorged. The patient has undergone previous CABG. There is calcification in the wall of the aortic arch. There are calcified pleural plaques bilaterally. The observed bony thorax exhibits no acute abnormality. IMPRESSION: Mild interval worsening in the appearance of the pulmonary interstitium which likely reflects CHF superimposed on known chronic lung disease. There is no alveolar pneumonia. Electronically Signed   By: David  Martinique M.D.   On: 06/04/2016 12:51      Medications:     Scheduled Medications: . arformoterol  15 mcg Nebulization BID  . atorvastatin  40 mg Oral Daily  . azithromycin  500 mg Oral Q24H  . budesonide (PULMICORT) nebulizer solution  0.5 mg Nebulization BID  . buPROPion  150 mg Oral BID  . chlorhexidine  15 mL Mouth Rinse BID  . Chlorhexidine Gluconate Cloth  6 each Topical Q0600  . citalopram  40 mg Oral Daily  . diltiazem  240 mg Oral Daily  . docusate sodium  100 mg Oral BID  . furosemide  80 mg Intravenous BID  . guaiFENesin  1,200 mg Oral BID  . insulin aspart  0-20 Units Subcutaneous TID WC  . insulin aspart  0-5 Units Subcutaneous QHS  . insulin glargine  70 Units Subcutaneous QHS  . ipratropium-albuterol  3 mL Nebulization Q6H  . isosorbide mononitrate  30 mg Oral Daily  . mouth rinse  15 mL Mouth Rinse q12n4p  . [START ON 06/07/2016] methylPREDNISolone (SOLU-MEDROL) injection  20 mg Intravenous Daily  . mupirocin ointment  1 application Nasal BID  . polyethylene glycol  17 g Oral Daily  . rivaroxaban  20 mg Oral Q supper  . sodium chloride flush  3 mL Intravenous Q12H  . tamsulosin  0.4 mg Oral QPC supper       Infusions: . sodium chloride    . sodium chloride 250 mL (06/06/16 0700)     PRN Medications:  sodium chloride, sodium chloride, acetaminophen **OR** acetaminophen, albuterol, ondansetron **OR** ondansetron (ZOFRAN) IV, sodium chloride flush   Assessment:   1. Acute/Chronic  Respiratory Failure that appears multifactorial given COPD/pneumonia/heart failure.  CT of chest 5/29 with mild bronchopnemonia. -suspect mainly COPD flare in setting of  probable chronic PAH and diastolic HF. Wife says BNP chronically around 400 -Continue abx/nebs/steroids per CCM. D/w Dr. Lake Bells at bedside -Will continue to diurese one more day to keep lungs as dry as possible. Give one dose metolazone. Watch renal function closely -Once PICC placed will measure CVP  2. A/C Diastolic Heart Failure  - Volume status difficult to assess with size.  - As above. Continue diuretics one more day.  - Place to PICC for CVP and CO-OX  - May benefit from [place] to assess for PAH once respiratory status back to baseline.  3. A fib on admit - Now in NSR. Continue Xarelto and diltiazem 4. AKI - creatinine trending up 1.0> 1.6. Follow closely with diuresis.  5. CAD H/O CABG 2015 - No signs/sx of ischemia. Off aspirin with xarelto. Continue statin. Troponin flat. No CP 6. DMII - on insulin per Primary Team.   Length of Stay: 2   Glori Bickers MD 06/06/2016, 9:36 AM  Advanced Heart Failure Team Pager [phone] (M-F; [place])  Please contact [facility] for night-coverage after hours (4p -7a ) and weekends on [email or web address]

## 2016-06-07 DIAGNOSIS — J441 Chronic obstructive pulmonary disease with (acute) exacerbation: Secondary | ICD-10-CM

## 2016-06-07 DIAGNOSIS — I5021 Acute systolic (congestive) heart failure: Secondary | ICD-10-CM

## 2016-06-07 DIAGNOSIS — E876 Hypokalemia: Secondary | ICD-10-CM

## 2016-06-07 DIAGNOSIS — N179 Acute kidney failure, unspecified: Secondary | ICD-10-CM

## 2016-06-07 LAB — CBC WITH DIFFERENTIAL/PLATELET
Basophils Absolute: 0 10*3/uL (ref 0.0–0.1)
Basophils Relative: 0 %
EOS PCT: 0 %
Eosinophils Absolute: 0 10*3/uL (ref 0.0–0.7)
HEMATOCRIT: 39.6 % (ref 39.0–52.0)
Hemoglobin: 11.8 g/dL — ABNORMAL LOW (ref 13.0–17.0)
LYMPHS ABS: 0.8 10*3/uL (ref 0.7–4.0)
LYMPHS PCT: 6 %
MCH: 25 pg — AB (ref 26.0–34.0)
MCHC: 29.8 g/dL — AB (ref 30.0–36.0)
MCV: 83.9 fL (ref 78.0–100.0)
MONO ABS: 1.6 10*3/uL — AB (ref 0.1–1.0)
MONOS PCT: 13 %
NEUTROS ABS: 10.1 10*3/uL — AB (ref 1.7–7.7)
Neutrophils Relative %: 81 %
PLATELETS: 199 10*3/uL (ref 150–400)
RBC: 4.72 MIL/uL (ref 4.22–5.81)
RDW: 16.3 % — ABNORMAL HIGH (ref 11.5–15.5)
WBC: 12.5 10*3/uL — ABNORMAL HIGH (ref 4.0–10.5)

## 2016-06-07 LAB — GLUCOSE, CAPILLARY
GLUCOSE-CAPILLARY: 230 mg/dL — AB (ref 65–99)
Glucose-Capillary: 256 mg/dL — ABNORMAL HIGH (ref 65–99)
Glucose-Capillary: 334 mg/dL — ABNORMAL HIGH (ref 65–99)
Glucose-Capillary: 292 mg/dL — ABNORMAL HIGH (ref 65–99)

## 2016-06-07 LAB — BASIC METABOLIC PANEL
ANION GAP: 10 (ref 5–15)
BUN: 68 mg/dL — AB (ref 6–20)
CHLORIDE: 97 mmol/L — AB (ref 101–111)
CO2: 30 mmol/L (ref 22–32)
Calcium: 8.2 mg/dL — ABNORMAL LOW (ref 8.9–10.3)
Creatinine, Ser: 1.42 mg/dL — ABNORMAL HIGH (ref 0.61–1.24)
GFR calc Af Amer: 53 mL/min — ABNORMAL LOW (ref >60)
GFR, EST NON AFRICAN AMERICAN: 46 mL/min — AB (ref >60)
GLUCOSE: 254 mg/dL — AB (ref 65–99)
Potassium: 3.1 mmol/L — ABNORMAL LOW (ref 3.5–5.1)
Sodium: 137 mmol/L (ref 135–145)

## 2016-06-07 MED ORDER — INSULIN GLARGINE 100 UNIT/ML ~~LOC~~ SOLN
80.0000 [IU] | Freq: Every day | SUBCUTANEOUS | Status: DC
  Administered 2016-06-07 – 2016-06-10 (×4): 80 [IU] via SUBCUTANEOUS
  Filled 2016-06-07 (×5): qty 0.8

## 2016-06-07 MED ORDER — POTASSIUM CHLORIDE CRYS ER 20 MEQ PO TBCR
40.0000 meq | EXTENDED_RELEASE_TABLET | Freq: Once | ORAL | Status: AC
  Administered 2016-06-07: 40 meq via ORAL
  Filled 2016-06-07: qty 2

## 2016-06-07 MED ORDER — SPIRONOLACTONE 25 MG PO TABS
25.0000 mg | ORAL_TABLET | Freq: Every day | ORAL | Status: DC
  Administered 2016-06-07 – 2016-06-11 (×4): 25 mg via ORAL
  Filled 2016-06-07 (×5): qty 1

## 2016-06-07 MED ORDER — POTASSIUM CHLORIDE CRYS ER 20 MEQ PO TBCR
40.0000 meq | EXTENDED_RELEASE_TABLET | Freq: Two times a day (BID) | ORAL | Status: DC
  Administered 2016-06-07 – 2016-06-11 (×7): 40 meq via ORAL
  Filled 2016-06-07 (×8): qty 2

## 2016-06-07 MED ORDER — METOLAZONE 2.5 MG PO TABS
2.5000 mg | ORAL_TABLET | Freq: Once | ORAL | Status: AC
  Administered 2016-06-07: 2.5 mg via ORAL
  Filled 2016-06-07: qty 1

## 2016-06-07 NOTE — Progress Notes (Signed)
eLink Physician-Brief Progress Note Patient Name: GRACE HAGGART DOB: 1940/05/07 MRN: [id number]   Date of Service  06/07/2016  HPI/Events of Note  hypokalemia  eICU Interventions  Potassium replaced     Intervention Category Intermediate Interventions: Electrolyte abnormality - evaluation and management  Tahjae Clausing 06/07/2016, 5:08 AM

## 2016-06-07 NOTE — Progress Notes (Signed)
Advanced Heart Failure Rounding Note   Subjective:    Developed respiratory failure 6/1 due to combination COPD and HF. Started on Bipap and moved to ICU.   Feels better. On/off BIPAP. Remains on low-dose solumedrol, nebs and abx.  Remains on IV lasix. Got one dose metolazone. Urine output picked up. Weight down 4 pounds. Creatinine improved 1.6->1.4. K 3.1  CVP 11.   Objective:   Weight Range:  Vital Signs:   Temp:  [96.6 F (35.9 C)-98.7 F (37.1 C)] 97.2 F (36.2 C) (06/03 1128) Pulse Rate:  [74-99] 82 (06/03 1200) Resp:  [13-31] 13 (06/03 1200) BP: (118-137)/(58-78) 121/68 (06/03 1200) SpO2:  [88 %-100 %] 98 % (06/03 1200) FiO2 (%):  [50 %] 50 % (06/03 1200) Weight:  [111.9 kg (246 lb 11.1 oz)] 111.9 kg (246 lb 11.1 oz) (06/03 0435) Last BM Date: 06/06/16  Weight change: Filed Weights   06/05/16 1700 06/06/16 0400 06/07/16 0435  Weight: 113.3 kg (249 lb 12.5 oz) 113.6 kg (250 lb 7.1 oz) 111.9 kg (246 lb 11.1 oz)    Intake/Output:   Intake/Output Summary (Last 24 hours) at 06/07/16 1215 Last data filed at 06/07/16 1100  Gross per 24 hour  Intake              700 ml  Output             2171 ml  Net            -1471 ml     Physical Exam: General: Lying flat in bed. On BIPAP. NAD HEENT: normal. anicteric Neck: supple. Thick  JVP hard to see.Carotids 2+ bilat; no bruits. No lymphadenopathy or thryomegaly appreciated. Cor: PMI nonpalpable. Distant. Regular. No obvious murmurs Lungs: Barrel chested. Clear. Air movement much improved. No wheezing.  Abdomen: obese soft, nontender, mildly distended. No hepatosplenomegaly. No bruits or masses. Good bowel sounds. Extremities: no cyanosis, clubbing, rash. No edema. Warm Neuro: alert & orientedx3, cranial nerves grossly intact. moves all 4 extremities w/o difficulty. Affect pleasane   Telemetry: NSR 80a. Personally reviewed   Labs: Basic Metabolic Panel:  Recent Labs Lab 06/04/16 1212 06/04/16 1629  06/05/16 0234 06/05/16 1913 06/06/16 0704 06/07/16 0354  NA 140  --  138 134* 137 137  K 3.5  --  3.9 3.8 3.6 3.1*  CL 103  --  102 98* 99* 97*  CO2 24  --  _0 GLUCOSE 160*  --  364* 417* 164* 254*  BUN 23*  --  35* 49* 55* 68*  CREATININE 1.07  --  1.46* 1.61* 1.60* 1.42*  CALCIUM 9.1  --  8.7* 8.3* 8.2* 8.2*  MG  --  2.1  --   --   --   --   PHOS  --  4.5  --   --   --   --     Liver Function Tests:  Recent Labs Lab 06/04/16 1212  AST 25  ALT 18  ALKPHOS 81  BILITOT 1.0  PROT 6.3*  ALBUMIN 3.4*   No results for input(s): LIPASE, AMYLASE in the last 168 hours. No results for input(s): AMMONIA in the last 168 hours.  CBC:  Recent Labs Lab 06/04/16 1212 06/05/16 0234 06/06/16 0704 06/07/16 0354  WBC 9.6 9.0 15.7* 12.5*  NEUTROABS 7.5  --   --  10.1*  HGB 13.1 12.3* 12.8* 11.8*  HCT 42.6 40.3 42.2 39.6  MCV 83.9 83.6 84.1 83.9  PLT PLATELET CLUMPS  NOTED ON SMEAR, COUNT APPEARS ADEQUATE 180 216 199    Cardiac Enzymes:  Recent Labs Lab 06/04/16 1629 06/04/16 2103 06/05/16 0234  TROPONINI 0.05* 0.04* 0.04*    BNP: BNP (last 3 results)  Recent Labs  10/23/15 1831 06/04/16 1155  BNP 47.7 462.5*    ProBNP (last 3 results) No results for input(s): PROBNP in the last 8760 hours.    Other results:  Imaging: Dg Chest Port 1 View  Result Date: 06/06/2016 CLINICAL DATA:  Confirm PICC line placement. EXAM: PORTABLE CHEST 1 VIEW COMPARISON:  Chest x-ray dated 06/05/2016. FINDINGS: Right-sided PICC line has been placed with tip appropriately positioned at the level of the mid/lower SVC. Cardiomegaly is stable. Median sternotomy wires in place. Atherosclerotic changes again noted at the aortic arch. Again noted is central pulmonary vascular congestion and bilateral interstitial edema. Suspect small bilateral pleural effusions with possible adjacent atelectasis. No pneumothorax seen. IMPRESSION: 1. Right-sided PICC line placement with tip  appropriately positioned at the level of the mid/lower SVC. 2. Persistent pulmonary vascular congestion and bilateral interstitial edema, suggesting CHF/volume overload. 3. Probable small bilateral pleural effusions and/or atelectasis. 4. Stable cardiomegaly. Electronically Signed   By: Franki Cabot M.D.   On: 06/06/2016 10:54   Dg Chest Port 1 View  Result Date: 06/05/2016 CLINICAL DATA:  Hypoxia. EXAM: PORTABLE CHEST 1 VIEW COMPARISON:  Single-view of the chest 06/04/2016. PA and lateral chest 05/18/2016. CT chest 06/02/2016. FINDINGS: Cardiomegaly and mild interstitial edema do not appear notably changed since the most recent study. The patient is status post CABG. No pneumothorax or pleural effusion. IMPRESSION: No change in cardiomegaly and mild interstitial edema. Electronically Signed   By: Inge Rise M.D.   On: 06/05/2016 17:19     Medications:     Scheduled Medications: . arformoterol  15 mcg Nebulization BID  . atorvastatin  40 mg Oral Daily  . azithromycin  500 mg Oral Q24H  . budesonide (PULMICORT) nebulizer solution  0.5 mg Nebulization BID  . buPROPion  150 mg Oral BID  . Chlorhexidine Gluconate Cloth  6 each Topical Q0600  . Chlorhexidine Gluconate Cloth  6 each Topical Daily  . citalopram  40 mg Oral Daily  . diltiazem  240 mg Oral Daily  . docusate sodium  100 mg Oral BID  . furosemide  80 mg Intravenous BID  . guaiFENesin  1,200 mg Oral BID  . insulin aspart  0-20 Units Subcutaneous TID WC  . insulin aspart  0-5 Units Subcutaneous QHS  . insulin glargine  80 Units Subcutaneous QHS  . ipratropium-albuterol  3 mL Nebulization Q6H  . isosorbide mononitrate  30 mg Oral Daily  . mouth rinse  15 mL Mouth Rinse BID  . methylPREDNISolone (SOLU-MEDROL) injection  20 mg Intravenous Daily  . mupirocin ointment  1 application Nasal BID  . polyethylene glycol  17 g Oral Daily  . rivaroxaban  20 mg Oral Q supper  . sodium chloride flush  10-40 mL Intracatheter Q12H  .  tamsulosin  0.4 mg Oral QPC supper    Infusions: . sodium chloride Stopped (06/06/16 1100)    PRN Medications: sodium chloride, acetaminophen **OR** acetaminophen, albuterol, ondansetron **OR** ondansetron (ZOFRAN) IV, sodium chloride flush   Assessment:   1. Acute/Chronic  Respiratory Failure that appears multifactorial given COPD/pneumonia/heart failure.  CT of chest 5/29 with mild bronchopnemonia. - Likely combination of COPD flare in setting of probable chronic PAH and diastolic HF. Wife says BNP chronically around 400 - Diuresing well  on IV lasix and metolazone. CVP 11.  - Continue abx/nebs/steroids per CCM. D/w Dr. [place] Bells at bedside - Will continue to diurese at least one more day to keep lungs as dry as possible. Give one more dose metolazone. Creatinine 1.6 -> 1.4. Watch renal function closely -Add spiro. Supp K  2. A/C Diastolic Heart Failure  - \As above. Continue to diurese. Will check co-ox.  - May benefit from [place] to assess for PAH once respiratory status back to baseline.  3. A fib on admit - Now in NSR. Continue Xarelto and diltiazem 4. AKI - creatinine trending back down with diuresis  1.0> 1.6 > 1.4. Follow closely with diuresis.  5. CAD H/O CABG 2015 - No signs/sx of ischemia. Off aspirin with xarelto. Continue statin. Troponin flat. No CP 6. DMII - on insulin per Primary Team.  7. Hypokalemia - will supp. Add spiro 25.  Length of Stay: 3   Fausto Sampedro MD 06/07/2016, 12:15 PM  Advanced Heart Failure Team Pager [phone] (M-F; [place])  Please contact [facility] for night-coverage after hours (4p -7a ) and weekends on [email or web address]

## 2016-06-07 NOTE — Plan of Care (Signed)
Problem: Activity: Goal: Risk for activity intolerance will decrease Outcome: Progressing Pt tolerating sitting at bedside and using BSC without difficulty. Will progress with OOB to chair with assistance and ambulation after PT/OT consults and makes recommendations.  Problem: Fluid Volume: Goal: Ability to maintain a balanced intake and output will improve Outcome: Progressing Pt tolerating Lasix BID with adequate hemodynamics. CVP remains 21-23, still s/s of D.O.E. With poor respiratory reserves. Able to tolerate weaning to HF [place] @ 6 L/M.

## 2016-06-07 NOTE — Progress Notes (Signed)
PULMONARY / CRITICAL CARE MEDICINE   Name: Joshua [name] MRN: [id number] DOB: 03-28-1940    ADMISSION DATE:  06/04/2016 CONSULTATION DATE:  06/05/2016  REFERRING MD:  Dr. Ree Kida  CHIEF COMPLAINT:  Dyspnea   HISTORY OF PRESENT ILLNESS:  76 y/o male with an extensive PMH including CAD, diastolic heart failure, and COPD was admited on 5/31 with dyspnea.  PCCM consulted for worsening respiratory failure on 6/1 requiring BIPAP and transfer to the ICU.   SUBJECTIVE: Pt reports feeling tired / fatigued, on BiPAP intermittently.    VITAL SIGNS: BP 137/68   Pulse 83   Temp 98.7 F (37.1 C) (Axillary)   Resp (!) 25   Ht 6' (1.829 m)   Wt 246 lb 11.1 oz (111.9 kg)   SpO2 96%   BMI 33.46 kg/m   HEMODYNAMICS: CVP:  [11 mmHg-30 mmHg] 11 mmHg  VENTILATOR SETTINGS: FiO2 (%):  [50 %] 50 %  INTAKE / OUTPUT: I/O last 3 completed shifts: In: 1618.5 [P.O.:1380; I.V.:188.5; IV Piggyback:50] Out: 2811 [Urine:2810; Stool:1]  PHYSICAL EXAMINATION: General:  Elderly male in NAD HEENT: MM pink/moist, BiPAP mask in place PSY: calm/appropriate  Neuro: AAOx4, speech clear, MAE CV: s1s2 irr irr, no m/r/g PULM: even/non-labored, lungs bilaterally  UV:OZDG, non-tender, bsx4 active  Extremities: warm/dry, no edema  Skin: no rashes or lesions   LABS:  BMET  Recent Labs Lab 06/05/16 1913 06/06/16 0704 06/07/16 0354  NA 134* 137 137  K 3.8 3.6 3.1*  CL 98* 99* 97*  CO2 _0 BUN 49* 55* 68*  CREATININE 1.61* 1.60* 1.42*  GLUCOSE 417* 164* 254*    Electrolytes  Recent Labs Lab 06/04/16 1629  06/05/16 1913 06/06/16 0704 06/07/16 0354  CALCIUM  --   < > 8.3* 8.2* 8.2*  MG 2.1  --   --   --   --   PHOS 4.5  --   --   --   --   < > = values in this interval not displayed.  CBC  Recent Labs Lab 06/05/16 0234 06/06/16 0704 06/07/16 0354  WBC 9.0 15.7* 12.5*  HGB 12.3* 12.8* 11.8*  HCT 40.3 42.2 39.6  PLT 180 216 199    Coag's  Recent Labs Lab 06/04/16 1212   INR 1.28    Sepsis Markers  Recent Labs Lab 06/04/16 1242 06/04/16 1654  LATICACIDVEN 2.64* 2.52*    ABG  Recent Labs Lab 06/05/16 1638  PHART 7.296*  PCO2ART 50.9*  PO2ART 74.3*    Liver Enzymes  Recent Labs Lab 06/04/16 1212  AST 25  ALT 18  ALKPHOS 81  BILITOT 1.0  ALBUMIN 3.4*    Cardiac Enzymes  Recent Labs Lab 06/04/16 1629 06/04/16 2103 06/05/16 0234  TROPONINI 0.05* 0.04* 0.04*    Glucose  Recent Labs Lab 06/06/16 0854 06/06/16 1104 06/06/16 1623 06/06/16 1735 06/06/16 2143 06/07/16 0741  GLUCAP 164* 159* 353* 351* 176* 256*    Imaging No results found.   STUDIES:  HRCT chest 5/29 >> mild patchy peribronchovascular consolidation in RUL, mild patchy ground-glass bilateral suggestive of mild bronchopneumonia;  bilateral calcified pleural plaques; small right, trace left dependent pleural effusions c/w asbestos related pleural disease; mild patchy subpleural reticulation and parenchymal banding throughout both lungs, mildly increased c/w asbestosis; aortic atherosclerosis, left main and 3 vessel coronary atherosclerosis s/p CABG; stable dilated main PA suggestive of chronic PAH, stable mild mediastinal lymphadenopathy CXR 6/1 >> no change in bilateral interstitial edema and emphysema and cardiomegaly ECHO  6/2 >> LV mildly dilated, mild concentric hypertrophy, LVEF 25-30%, severe diffuse hypokinesis, grade 2 diastolic dysfunction, LBBB, moderate MVR, LA mod to severely dilated, PA peak pressure 48  CULTURES: 5/31 BCx 2 >> 5/31 Sputum Culture >> not acceptable culture  ANTIBIOTICS: Azithromycin 5/31 >>  Rocephin 6/1 > 6/1  SIGNIFICANT EVENTS: 5/31  Admit 6/01  Tx to ICU   LINES/TUBES: PICC 6/2 >>   DISCUSSION: 76 year old admitted with pulmonary edema +/-AECOPD causing acute respiratory failure with hypoxemia.  ASSESSMENT / PLAN:  PULMONARY A: Acute hypoxic respiratory failure COPD exacerbation - greatly  improved Acute pulmonary edema  P:   Continue brovana + pulmicort Solumedrol 20 mg IV QD Duoneb Q6  PRN albuterol  BiPAP PRN for increased WOB Wean O2 for sats > 92% Mucinex BID  CARDIOVASCULAR A:  PAF - xarelto Diastolic Heart Failure Mild troponin leak - peak 0.05 Hx HTN, HLD P:  ICU monitoring  Diuresis per Cardiology, 80 mg BID ECHO as above  Continue xarelto + cardizem Continue lipitor, cardizem  RENAL A:   Chronic kidney disease P:   Trend BMP / urinary output Replace electrolytes as indicated Avoid nephrotoxic agents, ensure adequate renal perfusion Diuresis per CHF Team  GASTROINTESTINAL A:   Obesity  P:   PPI for SUP  Heart healthy diet  HEMATOLOGIC A:   Hx IDA- stable Hgb P:  Trend CBC Continue Xarelto   INFECTIOUS A:   No acute issues Doubt CAP P:   Follow fever curve / WBC trend Continue azithromycin, D4/5  ENDOCRINE A:   Hyperglycemia  Hx DM  2> brittle, very insulin resistant P:   SSI with resistant scale  Increase Lantus to 80 units QD  NEUROLOGIC A:   Hx Depression  P:   Continue wellbutrin + celexa  FAMILY  - Updates: Multiple children updated at bedside.  One daughter is Neurosurgeon at [employer].  Prior discussion > code status is no shocks but intubation for short term is OK  - Inter-disciplinary family meet or Palliative Care meeting due by:  06/12/2016    Noe Gens, NP-C [facility] Pgr: [phone] or if no answer [phone] 06/07/2016, 10:49 AM

## 2016-06-08 ENCOUNTER — Inpatient Hospital Stay (HOSPITAL_COMMUNITY): Payer: Medicare Other

## 2016-06-08 DIAGNOSIS — I5043 Acute on chronic combined systolic (congestive) and diastolic (congestive) heart failure: Secondary | ICD-10-CM

## 2016-06-08 LAB — GLUCOSE, CAPILLARY
GLUCOSE-CAPILLARY: 182 mg/dL — AB (ref 65–99)
Glucose-Capillary: 188 mg/dL — ABNORMAL HIGH (ref 65–99)
Glucose-Capillary: 383 mg/dL — ABNORMAL HIGH (ref 65–99)
Glucose-Capillary: 377 mg/dL — ABNORMAL HIGH (ref 65–99)
Glucose-Capillary: 322 mg/dL — ABNORMAL HIGH (ref 65–99)

## 2016-06-08 LAB — COOXEMETRY PANEL
Carboxyhemoglobin: 0.7 % (ref 0.5–1.5)
Methemoglobin: 1.4 % (ref 0.0–1.5)
O2 SAT: 53.9 %
TOTAL HEMOGLOBIN: 12.4 g/dL (ref 12.0–16.0)

## 2016-06-08 LAB — BLOOD GAS, ARTERIAL
ACID-BASE DEFICIT: 1.6 mmol/L (ref 0.0–2.0)
Bicarbonate: 24 mmol/L (ref 20.0–28.0)
Drawn by: 270221
EXPIRATORY PAP: 8
FIO2: 0.8
INSPIRATORY PAP: 18
LHR: 15 {breaths}/min
Mode: POSITIVE
O2 SAT: 91.5 %
PCO2 ART: 50.9 mmHg — AB (ref 32.0–48.0)
PH ART: 7.296 — AB (ref 7.350–7.450)
PO2 ART: 74.3 mmHg — AB (ref 83.0–108.0)
Patient temperature: 98.6

## 2016-06-08 LAB — BASIC METABOLIC PANEL
ANION GAP: 11 (ref 5–15)
BUN: 64 mg/dL — ABNORMAL HIGH (ref 6–20)
CALCIUM: 8.3 mg/dL — AB (ref 8.9–10.3)
CO2: 33 mmol/L — ABNORMAL HIGH (ref 22–32)
Chloride: 95 mmol/L — ABNORMAL LOW (ref 101–111)
Creatinine, Ser: 1.37 mg/dL — ABNORMAL HIGH (ref 0.61–1.24)
GFR, EST AFRICAN AMERICAN: 56 mL/min — AB (ref >60)
GFR, EST NON AFRICAN AMERICAN: 48 mL/min — AB (ref >60)
GLUCOSE: 215 mg/dL — AB (ref 65–99)
Potassium: 3.3 mmol/L — ABNORMAL LOW (ref 3.5–5.1)
SODIUM: 139 mmol/L (ref 135–145)

## 2016-06-08 LAB — CBC
HCT: 38.6 % — ABNORMAL LOW (ref 39.0–52.0)
Hemoglobin: 11.6 g/dL — ABNORMAL LOW (ref 13.0–17.0)
MCH: 25.3 pg — ABNORMAL LOW (ref 26.0–34.0)
MCHC: 30.1 g/dL (ref 30.0–36.0)
MCV: 84.1 fL (ref 78.0–100.0)
Platelets: 174 10*3/uL (ref 150–400)
RBC: 4.59 MIL/uL (ref 4.22–5.81)
RDW: 16.2 % — ABNORMAL HIGH (ref 11.5–15.5)
WBC: 8.7 10*3/uL (ref 4.0–10.5)

## 2016-06-08 MED ORDER — PREDNISONE 20 MG PO TABS
20.0000 mg | ORAL_TABLET | Freq: Every day | ORAL | Status: DC
  Administered 2016-06-09: 20 mg via ORAL
  Filled 2016-06-08: qty 1

## 2016-06-08 MED ORDER — MOMETASONE FURO-FORMOTEROL FUM 100-5 MCG/ACT IN AERO
2.0000 | INHALATION_SPRAY | Freq: Two times a day (BID) | RESPIRATORY_TRACT | Status: DC
  Administered 2016-06-08 – 2016-06-11 (×5): 2 via RESPIRATORY_TRACT
  Filled 2016-06-08: qty 8.8

## 2016-06-08 MED ORDER — CHLORHEXIDINE GLUCONATE CLOTH 2 % EX PADS
6.0000 | MEDICATED_PAD | Freq: Every day | CUTANEOUS | Status: AC
  Administered 2016-06-08 – 2016-06-09 (×2): 6 via TOPICAL

## 2016-06-08 MED ORDER — IPRATROPIUM-ALBUTEROL 0.5-2.5 (3) MG/3ML IN SOLN
3.0000 mL | RESPIRATORY_TRACT | Status: DC | PRN
  Administered 2016-06-08: 3 mL via RESPIRATORY_TRACT
  Filled 2016-06-08: qty 3

## 2016-06-08 MED ORDER — METOLAZONE 2.5 MG PO TABS
2.5000 mg | ORAL_TABLET | Freq: Once | ORAL | Status: AC
Start: 2016-06-08 — End: 2016-06-08
  Administered 2016-06-08: 2.5 mg via ORAL
  Filled 2016-06-08: qty 1

## 2016-06-08 MED ORDER — POTASSIUM CHLORIDE CRYS ER 20 MEQ PO TBCR
20.0000 meq | EXTENDED_RELEASE_TABLET | Freq: Once | ORAL | Status: AC
  Administered 2016-06-08: 20 meq via ORAL
  Filled 2016-06-08: qty 1

## 2016-06-08 MED ORDER — BISOPROLOL FUMARATE 5 MG PO TABS
5.0000 mg | ORAL_TABLET | Freq: Every day | ORAL | Status: DC
  Administered 2016-06-08 – 2016-06-11 (×3): 5 mg via ORAL
  Filled 2016-06-08 (×4): qty 1

## 2016-06-08 NOTE — Progress Notes (Signed)
Patient ID: Joshua [name], male   DOB: 1940-01-23, 76 y.o.   MRN: [id number]    Advanced Heart Failure Rounding Note   Subjective:    Developed respiratory failure 6/1 due to combination AECOPD and acute HF. Started on Bipap and moved to ICU.   Remains on IV lasix. Got dose metolazone again yesterday. Urine output better. Weight down 5 pounds. BUN/creatinine slightly improved.  1.6->1.4->1.37  CVP 12.   Breathing better today.   Echo (6/3): EF 25-30%, severe hypokinesis, moderate MR, mildly dilated RV with mildly decreased systolic function, PASP 48 mmHg.   Objective:   Weight Range:  Vital Signs:   Temp:  [97.2 F (36.2 C)-98.6 F (37 C)] 97.9 F (36.6 C) (06/04 0416) Pulse Rate:  [65-135] 88 (06/04 0700) Resp:  [13-31] 19 (06/04 0700) BP: (109-142)/(61-99) 112/99 (06/04 0700) SpO2:  [88 %-99 %] 96 % (06/04 0700) FiO2 (%):  [50 %] 50 % (06/04 0107) Weight:  [241 lb 9.6 oz (109.6 kg)] 241 lb 9.6 oz (109.6 kg) (06/04 0416) Last BM Date: 06/06/16  Weight change: Filed Weights   06/06/16 0400 06/07/16 0435 06/08/16 0416  Weight: 250 lb 7.1 oz (113.6 kg) 246 lb 11.1 oz (111.9 kg) 241 lb 9.6 oz (109.6 kg)    Intake/Output:   Intake/Output Summary (Last 24 hours) at 06/08/16 0749 Last data filed at 06/08/16 0600  Gross per 24 hour  Intake             1380 ml  Output             3675 ml  Net            -2295 ml     Physical Exam: General: NAD, 2L [place] HEENT: normal. anicteric Neck: supple. Thick JVP 10-11 cm.  Cor: PMI nonpalpable. Distant. Regular. No obvious murmurs, no S3/S4.  Lungs: Barrel chested. Decreased breath sounds bilaterally but no wheezing.   Abdomen: obese soft, nontender, mildly distended. No hepatosplenomegaly. No bruits or masses. Good bowel sounds. Extremities: no cyanosis, clubbing, rash. No edema. Warm Neuro: alert & orientedx3, cranial nerves grossly intact. moves all 4 extremities w/o difficulty. Affect pleasant   Telemetry: Personally  reviewed, NSR in 80s with LBBB   Labs: Basic Metabolic Panel:  Recent Labs Lab 06/04/16 1629 06/05/16 0234 06/05/16 1913 06/06/16 0704 06/07/16 0354 06/08/16 0350  NA  --  138 134* 137 137 139  K  --  3.9 3.8 3.6 3.1* 3.3*  CL  --  102 98* 99* 97* 95*  CO2  --  _0 33*  GLUCOSE  --  364* 417* 164* 254* 215*  BUN  --  35* 49* 55* 68* 64*  CREATININE  --  1.46* 1.61* 1.60* 1.42* 1.37*  CALCIUM  --  8.7* 8.3* 8.2* 8.2* 8.3*  MG 2.1  --   --   --   --   --   PHOS 4.5  --   --   --   --   --     Liver Function Tests:  Recent Labs Lab 06/04/16 1212  AST 25  ALT 18  ALKPHOS 81  BILITOT 1.0  PROT 6.3*  ALBUMIN 3.4*   No results for input(s): LIPASE, AMYLASE in the last 168 hours. No results for input(s): AMMONIA in the last 168 hours.  CBC:  Recent Labs Lab 06/04/16 1212 06/05/16 0234 06/06/16 0704 06/07/16 0354 06/08/16 0350  WBC 9.6 9.0 15.7* 12.5* 8.7  NEUTROABS 7.5  --   --  10.1*  --   HGB 13.1 12.3* 12.8* 11.8* 11.6*  HCT 42.6 40.3 42.2 39.6 38.6*  MCV 83.9 83.6 84.1 83.9 84.1  PLT PLATELET CLUMPS NOTED ON SMEAR, COUNT APPEARS ADEQUATE 180 216 199 174    Cardiac Enzymes:  Recent Labs Lab 06/04/16 1629 06/04/16 2103 06/05/16 0234  TROPONINI 0.05* 0.04* 0.04*    BNP: BNP (last 3 results)  Recent Labs  10/23/15 1831 06/04/16 1155  BNP 47.7 462.5*    ProBNP (last 3 results) No results for input(s): PROBNP in the last 8760 hours.    Other results:  Imaging: Dg Chest Port 1 View  Result Date: 06/08/2016 CLINICAL DATA:  Congestive heart failure EXAM: PORTABLE CHEST 1 VIEW COMPARISON:  June 06, 2016 FINDINGS: Central catheter tip is at the cavoatrial junction. No pneumothorax. There are small pleural effusions bilaterally. There is interstitial edema. There is cardiomegaly with pulmonary venous hypertension. There is mild bibasilar atelectasis. There is patchy airspace consolidation in the left base. No evident bone lesions. No  adenopathy. IMPRESSION: Evidence of congestive heart failure. Mild bibasilar atelectasis. Question alveolar edema versus patchy pneumonia left base. No pneumothorax. There appears to be slightly less edema compared to most recent study. Electronically Signed   By: Lowella Grip III M.D.   On: 06/08/2016 07:12   Dg Chest Port 1 View  Result Date: 06/06/2016 CLINICAL DATA:  Confirm PICC line placement. EXAM: PORTABLE CHEST 1 VIEW COMPARISON:  Chest x-ray dated 06/05/2016. FINDINGS: Right-sided PICC line has been placed with tip appropriately positioned at the level of the mid/lower SVC. Cardiomegaly is stable. Median sternotomy wires in place. Atherosclerotic changes again noted at the aortic arch. Again noted is central pulmonary vascular congestion and bilateral interstitial edema. Suspect small bilateral pleural effusions with possible adjacent atelectasis. No pneumothorax seen. IMPRESSION: 1. Right-sided PICC line placement with tip appropriately positioned at the level of the mid/lower SVC. 2. Persistent pulmonary vascular congestion and bilateral interstitial edema, suggesting CHF/volume overload. 3. Probable small bilateral pleural effusions and/or atelectasis. 4. Stable cardiomegaly. Electronically Signed   By: Franki Cabot M.D.   On: 06/06/2016 10:54     Medications:     Scheduled Medications: . arformoterol  15 mcg Nebulization BID  . atorvastatin  40 mg Oral Daily  . azithromycin  500 mg Oral Q24H  . bisoprolol  5 mg Oral Daily  . budesonide (PULMICORT) nebulizer solution  0.5 mg Nebulization BID  . buPROPion  150 mg Oral BID  . Chlorhexidine Gluconate Cloth  6 each Topical Daily  . Chlorhexidine Gluconate Cloth  6 each Topical Q0600  . citalopram  40 mg Oral Daily  . docusate sodium  100 mg Oral BID  . furosemide  80 mg Intravenous BID  . guaiFENesin  1,200 mg Oral BID  . insulin aspart  0-20 Units Subcutaneous TID WC  . insulin aspart  0-5 Units Subcutaneous QHS  . insulin  glargine  80 Units Subcutaneous QHS  . ipratropium-albuterol  3 mL Nebulization Q6H  . isosorbide mononitrate  30 mg Oral Daily  . mouth rinse  15 mL Mouth Rinse BID  . methylPREDNISolone (SOLU-MEDROL) injection  20 mg Intravenous Daily  . metolazone  2.5 mg Oral Once  . mupirocin ointment  1 application Nasal BID  . polyethylene glycol  17 g Oral Daily  . potassium chloride  20 mEq Oral Once  . potassium chloride  40 mEq Oral BID  . rivaroxaban  20 mg Oral Q supper  . sodium chloride  flush  10-40 mL Intracatheter Q12H  . spironolactone  25 mg Oral Daily  . tamsulosin  0.4 mg Oral QPC supper    Infusions: . sodium chloride Stopped (06/06/16 1100)    PRN Medications: sodium chloride, acetaminophen **OR** acetaminophen, albuterol, ondansetron **OR** ondansetron (ZOFRAN) IV, sodium chloride flush   Assessment:   1. Acute on chronic hypoxemic respiratory failure: Patient has COPD, wears 2L O2 by nasal cannula normally.  CT chest 5/29 read as PNA but may have been more CHF, and patient we thought to have COPD exacerbation with wheezing.  However, he was also volume overloaded and in atrial fibrillation.  Suspect respiratory failure is multifactorial (AECOPD, acute systolic CHF). He is doing better with combination of diuresis, steroids, and bronchodilators.  - Continue inhalers, nebs, Solumedrol, azithromycin for AECOPD per pulmonary.  2. Acute systolic CHF: He has been volume overloaded on exam, now diuresing.  CXR has shown pulmonary edema, today's CXR with CHF noted but improving.  Echo 6/3 showed fall in EF to 25-30%.  CVP 12 today, volume overload on exam.  He diuresed well yesterday, weight coming down.   - Lasix 80 mg IV bid and will give dose of metolazone 2.5 mg x 1.  Probably needs 1 more day of IV diuresis.  - Significant fall in EF, no chest pain.  He has a new LBBB.  Will plan left and right heart catheterization after full diuresis.  We discussed risks/benefits of this today  and he agrees to proceed.  Will tentatively plan to stop Xarelto tomorrow with cath on Wednesday.  3. AKI: BUN remains high but creatinine trending down.  Follow closely with diuresis.  Probably needs 1 more day.  4. PNA: Questionable on chest CT.  Cultures negative, afebrile.  On azithromycin at this point. 5. Atrial fibrillation: History of PAF, has not missed Xarelto.  He was in atrial fibrillation initially but now back in NSR.   - Given baseline COPD would avoid amiodarone use.  - Continue Xarelto but hold tomorrow's dose for cath on Wednesday.  - With fall in EF, will stop diltiazem CD and use bisoprolol (more beta-1 selective given COPD).  6. CAD: s/p CABG.  Very mild troponin elevation with no trend, suspect demand ischemia.  However, he has a new LBBB and a fall in EF from prior.  I do not think that he had ACS with plaque rupture triggering this event, but certainly could have had worsening of his CAD in the near past.  - No ASA given Xarelto use. Continue statin.  - As above, will plan L/RHC prior to discharge this admission.  7. 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  EF 6/3 was down to 25-30%.   - Continue spironolactone.  - Stop diltiazem CD, start bisoprolol 5 daily (beta-1 selective with COPD).  - Eventually would like to get him on ARB but will hold off with recent rise in creatinine.   Length of Stay: 4   Loralie Champagne MD 06/08/2016, 7:49 AM  Advanced Heart Failure Team Pager [phone] (M-F; 7a - 4p)  Please contact [facility] for night-coverage after hours (4p -7a ) and weekends on [email or web address]

## 2016-06-08 NOTE — Progress Notes (Signed)
CARDIAC REHAB PHASE I   PRE:  Rate/Rhythm: 74 SR  BP:  Supine:   Sitting: 103/65  Standing:    SaO2: 96% 6L  MODE:  Ambulation: 40 ft   POST:  Rate/Rhythm: 79 SR  BP:  Supine:   Sitting: 95/55  Standing:    SaO2: 91-96% 6L 1345-1420 Pt walked to just outside of room and few feet in hallway on 6L with rolling walker and asst x 2. Pt denied dizziness with drop in BP. Tired by end of walk. To sitting on side of bed after walk. 40 ft total distance.   Graylon Good, RN BSN  06/08/2016 2:15 PM

## 2016-06-08 NOTE — Plan of Care (Signed)
Problem: Physical Regulation: Goal: Ability to maintain clinical measurements within normal limits will improve Outcome: Progressing Pt with improved activity tolerances without excessive dyspnea. Maintaining sats > 90 % on 6 L HF [place].  Problem: Fluid Volume: Goal: Ability to maintain a balanced intake and output will improve Outcome: Progressing Pt responding well to ordered diuretic with fluid balance of -2450/ 24 hrs.

## 2016-06-08 NOTE — Progress Notes (Signed)
Inpatient Diabetes Program Recommendations  AACE/ADA: New Consensus Statement on Inpatient Glycemic Control (2015)  Target Ranges:  Prepandial:   less than 140 mg/dL      Peak postprandial:   less than 180 mg/dL (1-2 hours)      Critically ill patients:  140 - 180 mg/dL   Lab Results  Component Value Date   GLUCAP 292 (H) 06/07/2016   HGBA1C 7.3 (H) 06/04/2016    Review of Glycemic Control Results for Joshua [name], Joshua [name] (MRN [id number]) as of 06/08/2016 10:37  Ref. Range 06/07/2016 07:41 06/07/2016 11:54 06/07/2016 16:48 06/07/2016 21:39  Glucose-Capillary Latest Ref Range: 65 - 99 mg/dL 256 (H) 230 (H) 334 (H) 292 (H)    Diabetes history: DM2 Outpatient Diabetes medications:Toujeo 70units in a.m., Novolin R 28 units before mealstid+ Actos 45 mg daily, Synjardy 05/998 twice a day Current orders for Inpatient glycemic control: Lantus 80 units qd +Novolog 0-20 units tid + 0-5 units hs  Inpatient Diabetes Program Recommendations:  Please consider: -Novolog 8 units tid meal coverage if eats 50% meal -Increase Novolog correction to q 4 hrs while in ICU  Thank you, Bethena Roys E. Armour Villanueva, RN, MSN, CDE  Diabetes Coordinator Inpatient Glycemic Control Team Team Pager [phone] (8am-5pm) 06/08/2016 10:40 AM

## 2016-06-08 NOTE — Progress Notes (Signed)
Placed pt on BiPAP at this time per pt request to wear for the night. Pt tolerating well at this time

## 2016-06-08 NOTE — Progress Notes (Signed)
PULMONARY / CRITICAL CARE MEDICINE   Name: Joshua [name] MRN: [id number] DOB: [birth year]    ADMISSION DATE:  06/04/2016 CONSULTATION DATE:  06/05/2016  REFERRING MD:  Dr. Ree Kida  CHIEF COMPLAINT:  Dyspnea   HISTORY OF PRESENT ILLNESS:   76 yo male presented with progressive dyspnea.  He is followed in pulmonary office for COPD and asbestos related lung disease.  He has PMHx of combined CHF and CAD.  Required Bipap and transferred to ICU.  SUBJECTIVE:  Breathing slowly improving.  Uses 2 to 3 liters oxygen at home.  Intermittent cough, but not much sputum.  Has wheeze from his upper chest at times.  Denies chest pain.  Walked in hall this AM.    VITAL SIGNS: BP 126/75   Pulse 93   Temp 97.9 F (36.6 C) (Oral)   Resp 20   Ht 6' (1.829 m)   Wt 241 lb 9.6 oz (109.6 kg)   SpO2 90%   BMI 32.77 kg/m   INTAKE / OUTPUT: I/O last 3 completed shifts: In: 1740 [P.O.:1740] Out: 4396 [Urine:4395; Stool:1]  PHYSICAL EXAMINATION:  General - pleasant Eyes - pupils reactive ENT - no sinus tenderness, no oral exudate, no LAN Cardiac - regular, no murmur Chest - no wheeze, rales Abd - soft, non tender Ext - no edema Skin - no rashes Neuro - normal strength Psych - normal mood    LABS:  BMET  Recent Labs Lab 06/06/16 0704 06/07/16 0354 06/08/16 0350  NA 137 137 139  K 3.6 3.1* 3.3*  CL 99* 97* 95*  CO2 23 30 33*  BUN 55* 68* 64*  CREATININE 1.60* 1.42* 1.37*  GLUCOSE 164* 254* 215*    Electrolytes  Recent Labs Lab 06/04/16 1629  06/06/16 0704 06/07/16 0354 06/08/16 0350  CALCIUM  --   < > 8.2* 8.2* 8.3*  MG 2.1  --   --   --   --   PHOS 4.5  --   --   --   --   < > = values in this interval not displayed.  CBC  Recent Labs Lab 06/06/16 0704 06/07/16 0354 06/08/16 0350  WBC 15.7* 12.5* 8.7  HGB 12.8* 11.8* 11.6*  HCT 42.2 39.6 38.6*  PLT 216 199 174    Coag's  Recent Labs Lab 06/04/16 1212  INR 1.28    Sepsis Markers  Recent Labs Lab  06/04/16 1242 06/04/16 1654  LATICACIDVEN 2.64* 2.52*    ABG  Recent Labs Lab 06/05/16 1638  PHART 7.296*  PCO2ART 50.9*  PO2ART 74.3*    Liver Enzymes  Recent Labs Lab 06/04/16 1212  AST 25  ALT 18  ALKPHOS 81  BILITOT 1.0  ALBUMIN 3.4*    Cardiac Enzymes  Recent Labs Lab 06/04/16 1629 06/04/16 2103 06/05/16 0234  TROPONINI 0.05* 0.04* 0.04*    Glucose  Recent Labs Lab 06/06/16 1735 06/06/16 2143 06/07/16 0741 06/07/16 1154 06/07/16 1648 06/07/16 2139  GLUCAP 351* 176* 256* 230* 334* 292*    Imaging Dg Chest Port 1 View  Result Date: 06/08/2016 CLINICAL DATA:  Congestive heart failure EXAM: PORTABLE CHEST 1 VIEW COMPARISON:  June 06, 2016 FINDINGS: Central catheter tip is at the cavoatrial junction. No pneumothorax. There are small pleural effusions bilaterally. There is interstitial edema. There is cardiomegaly with pulmonary venous hypertension. There is mild bibasilar atelectasis. There is patchy airspace consolidation in the left base. No evident bone lesions. No adenopathy. IMPRESSION: Evidence of congestive heart failure. Mild bibasilar atelectasis.  Question alveolar edema versus patchy pneumonia left base. No pneumothorax. There appears to be slightly less edema compared to most recent study. Electronically Signed   By: Lowella Grip III M.D.   On: 06/08/2016 07:12     STUDIES:  HRCT chest 5/29 >> consolidation RUL with mild patchy GGO, b/l pleural plaques, mild subpleural reticulation, atherosclerosis ECHO 6/2 >> mild LVH, EF 25 to 30%, grade 2 DD, mod MR, PAS 48 mmHg  CULTURES: 5/31 BCx 2 >>  ANTIBIOTICS: Azithromycin 5/31 >>  Rocephin 6/1 > 6/1  SIGNIFICANT EVENTS: 5/31  Admit 6/01  Tx to ICU   LINES/TUBES: PICC 6/2 >>   DISCUSSION: 76 year old admitted with pulmonary edema +/-AECOPD causing acute respiratory failure with hypoxemia.  ASSESSMENT / PLAN:  Acute on chronic hypoxic respiratory failure. COPD  exacerbation. Hx of asbestos related lung disease. 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 PAF. Hx of HTN, HLD. - negative fluid balance as able  DVT prophylaxis - xarelto SUP - not indicated Nutrition - heart healthy Goals of care - Okay with intubation.  No CPR, no defibrillation.  Okay to transfer to SDU from [facility] standpoint.  Chesley Mires, MD [facility] Pulmonary/Critical Care 06/08/2016, 11:29 AM Pager:  [phone] After 3pm call: [phone]

## 2016-06-08 NOTE — Progress Notes (Signed)
Physical Therapy Treatment Patient Details Name: Joshua [name] MRN: [id number] DOB: June 11, 1940 Today's Date: 06/08/2016    History of Present Illness 76 yo admitted with acute on chronic respiratory distress. PMHx: COPD, HTN, DM, HLD, obesity, CABG, CAD, CKD, HF    PT Comments    Pt admitted with above diagnosis. Pt currently with functional limitations due to balance and endurance deficits. Pt was able to ambulate with RW but limited distance as he was fatigued and DOE 3/4.  Desats on 6L O2 to mid 80's.  Will continue to progress as pt able.  Pt will benefit from skilled PT to increase their independence and safety with mobility to allow discharge to the venue listed below.     Follow Up Recommendations  Home health PT     Equipment Recommendations  None recommended by PT    Recommendations for Other Services       Precautions / Restrictions Precautions Precautions: Fall Precaution Comments: watch sats Restrictions Weight Bearing Restrictions: No    Mobility  Bed Mobility Overal bed mobility: Modified Independent             General bed mobility comments: HOB 15 degrees  Transfers Overall transfer level: Needs assistance Equipment used: Rolling walker (2 wheeled) Transfers: Sit to/from Stand Sit to Stand: Min guard         General transfer comment: uses momentum and needed cues for hand placement  Ambulation/Gait Ambulation/Gait assistance: Min assist Ambulation Distance (Feet): 65 Feet Assistive device: Rolling walker (2 wheeled) Gait Pattern/deviations: Step-through pattern;Decreased stride length;Trunk flexed   Gait velocity interpretation: Below normal speed for age/gender General Gait Details: Pt needed cues for posture and breathing technique. Also cues to stay close to RW. Pt limited by 3/4 dyspnea with activity and only agreed to ambulate short distance due to fatigue. Pt on 6L.  Sats dropping to 85% on 6L with rest breaks standing to incr O2.  O2 94%  once back and lying down on 6L.  HRand BP stable throughout.     Stairs            Wheelchair Mobility    Modified Rankin (Stroke Patients Only)       Balance Overall balance assessment: Needs assistance Sitting-balance support: No upper extremity supported;Feet supported Sitting balance-Leahy Scale: Good     Standing balance support: Bilateral upper extremity supported;During functional activity Standing balance-Leahy Scale: Poor Standing balance comment: relies on RW for balance                             Cognition Arousal/Alertness: Awake/alert Behavior During Therapy: WFL for tasks assessed/performed Overall Cognitive Status: Within Functional Limits for tasks assessed                                        Exercises General Exercises - Lower Extremity Long Arc Quad: AROM;10 reps;Both;Seated    General Comments        Pertinent Vitals/Pain Pain Assessment: No/denies pain    Home Living                      Prior Function            PT Goals (current goals can now be found in the care plan section) Progress towards PT goals: Progressing toward goals    Frequency  Min 3X/week      PT Plan Current plan remains appropriate    Co-evaluation              AM-PAC PT "6 Clicks" Daily Activity  Outcome Measure  Difficulty turning over in bed (including adjusting bedclothes, sheets and blankets)?: A Little Difficulty moving from lying on back to sitting on the side of the bed? : A Little Difficulty sitting down on and standing up from a chair with arms (e.g., wheelchair, bedside commode, etc,.)?: A Little Help needed moving to and from a bed to chair (including a wheelchair)?: A Little Help needed walking in hospital room?: A Little Help needed climbing 3-5 steps with a railing? : A Little 6 Click Score: 18    End of Session Equipment Utilized During Treatment: Gait belt;Oxygen Activity Tolerance:  Patient limited by fatigue Patient left: with call bell/phone within reach;in bed;with family/visitor present Nurse Communication: Mobility status PT Visit Diagnosis: Muscle weakness (generalized) (M62.81);Difficulty in walking, not elsewhere classified (R26.2)     Time: 7425-5258 PT Time Calculation (min) (ACUTE ONLY): 27 min  Charges:  $Gait Training: 8-22 mins $Therapeutic Exercise: 8-22 mins                    G Codes:       Joshua [name],PT Acute Rehabilitation [phone] (pager)    Joshua [name] 06/08/2016, 3:23 PM

## 2016-06-09 LAB — CBC
HEMATOCRIT: 39.8 % (ref 39.0–52.0)
HEMOGLOBIN: 11.9 g/dL — AB (ref 13.0–17.0)
MCH: 25.2 pg — ABNORMAL LOW (ref 26.0–34.0)
MCHC: 29.9 g/dL — AB (ref 30.0–36.0)
MCV: 84.1 fL (ref 78.0–100.0)
Platelets: 208 10*3/uL (ref 150–400)
RBC: 4.73 MIL/uL (ref 4.22–5.81)
RDW: 16 % — ABNORMAL HIGH (ref 11.5–15.5)
WBC: 10.9 10*3/uL — AB (ref 4.0–10.5)

## 2016-06-09 LAB — BASIC METABOLIC PANEL
ANION GAP: 8 (ref 5–15)
BUN: 57 mg/dL — ABNORMAL HIGH (ref 6–20)
CALCIUM: 8.3 mg/dL — AB (ref 8.9–10.3)
CO2: 37 mmol/L — ABNORMAL HIGH (ref 22–32)
Chloride: 87 mmol/L — ABNORMAL LOW (ref 101–111)
Creatinine, Ser: 1.29 mg/dL — ABNORMAL HIGH (ref 0.61–1.24)
GFR, EST NON AFRICAN AMERICAN: 52 mL/min — AB (ref >60)
GLUCOSE: 255 mg/dL — AB (ref 65–99)
Potassium: 3.9 mmol/L (ref 3.5–5.1)
SODIUM: 132 mmol/L — AB (ref 135–145)

## 2016-06-09 LAB — CULTURE, BLOOD (ROUTINE X 2)
CULTURE: NO GROWTH
CULTURE: NO GROWTH
Special Requests: ADEQUATE
Special Requests: ADEQUATE

## 2016-06-09 LAB — GLUCOSE, CAPILLARY
GLUCOSE-CAPILLARY: 270 mg/dL — AB (ref 65–99)
Glucose-Capillary: 263 mg/dL — ABNORMAL HIGH (ref 65–99)
Glucose-Capillary: 303 mg/dL — ABNORMAL HIGH (ref 65–99)

## 2016-06-09 MED ORDER — METOLAZONE 2.5 MG PO TABS
2.5000 mg | ORAL_TABLET | Freq: Once | ORAL | Status: AC
  Administered 2016-06-09: 2.5 mg via ORAL
  Filled 2016-06-09: qty 1

## 2016-06-09 MED ORDER — SODIUM CHLORIDE 0.9% FLUSH
3.0000 mL | Freq: Two times a day (BID) | INTRAVENOUS | Status: DC
  Administered 2016-06-09: 10 mL via INTRAVENOUS

## 2016-06-09 MED ORDER — SODIUM CHLORIDE 0.9 % IV SOLN: INTRAVENOUS | Status: DC

## 2016-06-09 MED ORDER — ASPIRIN 81 MG PO CHEW
81.0000 mg | CHEWABLE_TABLET | ORAL | Status: AC
  Administered 2016-06-10: 81 mg via ORAL
  Filled 2016-06-09: qty 1

## 2016-06-09 MED ORDER — DIGOXIN 125 MCG PO TABS: 0.1250 mg | ORAL_TABLET | Freq: Every day | ORAL | Status: DC

## 2016-06-09 MED ORDER — DIGOXIN 125 MCG PO TABS
0.1250 mg | ORAL_TABLET | Freq: Every day | ORAL | Status: DC
  Administered 2016-06-09 – 2016-06-11 (×2): 0.125 mg via ORAL
  Filled 2016-06-09 (×3): qty 1

## 2016-06-09 MED ORDER — SODIUM CHLORIDE 0.9 % IV SOLN: 250.0000 mL | INTRAVENOUS | Status: DC | PRN

## 2016-06-09 MED ORDER — CITALOPRAM HYDROBROMIDE 20 MG PO TABS
20.0000 mg | ORAL_TABLET | Freq: Every day | ORAL | Status: DC
  Administered 2016-06-09 – 2016-06-11 (×2): 20 mg via ORAL
  Filled 2016-06-09 (×3): qty 1

## 2016-06-09 MED ORDER — PREDNISONE 5 MG PO TABS
15.0000 mg | ORAL_TABLET | Freq: Every day | ORAL | Status: DC
  Administered 2016-06-10 – 2016-06-11 (×2): 15 mg via ORAL
  Filled 2016-06-09: qty 2
  Filled 2016-06-09: qty 1

## 2016-06-09 MED ORDER — SODIUM CHLORIDE 0.9% FLUSH: 3.0000 mL | INTRAVENOUS | Status: DC | PRN

## 2016-06-09 NOTE — Progress Notes (Signed)
Patient ID: Joshua [name], male   DOB: [birth year], 77 y.o.   MRN: [id number]    Advanced Heart Failure Rounding Note   Subjective:    Developed respiratory failure 6/1 due to combination AECOPD and acute HF. Started on Bipap and moved to ICU.   Remains on IV lasix. Got dose metolazone again yesterday. Urine output excellent. Weight down 5 pounds again. BUN/creatinine slightly improved.  1.6->1.4->1.37->1.29.   He had runs of atrial fibrillation yesterday but is in NSR today.   CVP 13.   Breathing better today but still short of breath walking to the hall.    Echo (6/3): EF 25-30%, severe hypokinesis, moderate MR, mildly dilated RV with mildly decreased systolic function, PASP 48 mmHg.   Objective:   Weight Range:  Vital Signs:   Temp:  [97.6 F (36.4 C)-98.6 F (37 C)] 97.8 F (36.6 C) (06/05 0300) Pulse Rate:  [69-100] 74 (06/05 0700) Resp:  [10-32] 16 (06/05 0700) BP: (101-129)/(62-100) 119/65 (06/05 0500) SpO2:  [89 %-99 %] 98 % (06/05 0700) FiO2 (%):  [50 %] 50 % (06/05 0000) Weight:  [236 lb 12.4 oz (107.4 kg)] 236 lb 12.4 oz (107.4 kg) (06/05 0300) Last BM Date: 06/08/16  Weight change: Filed Weights   06/07/16 0435 06/08/16 0416 06/09/16 0300  Weight: 246 lb 11.1 oz (111.9 kg) 241 lb 9.6 oz (109.6 kg) 236 lb 12.4 oz (107.4 kg)    Intake/Output:   Intake/Output Summary (Last 24 hours) at 06/09/16 0748 Last data filed at 06/09/16 0530  Gross per 24 hour  Intake             1460 ml  Output             4375 ml  Net            -2915 ml     Physical Exam: General: NAD, 2L [place] HEENT: normal. anicteric Neck: Thick, JVP 10-11 cm.  Cor: PMI nonpalpable. Regular S1S2. No obvious murmurs, no S3/S4.  Lungs: Barrel chested. Decreased breath sounds bilaterally but no wheezing.   Abdomen: obese soft, nontender, mildly distended. No hepatosplenomegaly. No bruits or masses. Good bowel sounds. Extremities: no cyanosis, clubbing, rash. No edema. Warm Neuro: alert &  orientedx3, cranial nerves grossly intact. moves all 4 extremities w/o difficulty. Affect pleasant   Telemetry: Personally reviewed, NSR in 80s with LBBB   Labs: Basic Metabolic Panel:  Recent Labs Lab 06/04/16 1629  06/05/16 1913 06/06/16 0704 06/07/16 0354 06/08/16 0350 06/09/16 0337  NA  --   < > 134* 137 137 139 132*  K  --   < > 3.8 3.6 3.1* 3.3* 3.9  CL  --   < > 98* 99* 97* 95* 87*  CO2  --   < > _0 33* 37*  GLUCOSE  --   < > 417* 164* 254* 215* 255*  BUN  --   < > 49* 55* 68* 64* 57*  CREATININE  --   < > 1.61* 1.60* 1.42* 1.37* 1.29*  CALCIUM  --   < > 8.3* 8.2* 8.2* 8.3* 8.3*  MG 2.1  --   --   --   --   --   --   PHOS 4.5  --   --   --   --   --   --   < > = values in this interval not displayed.  Liver Function Tests:  Recent Labs Lab 06/04/16 1212  AST 25  ALT  18  ALKPHOS 81  BILITOT 1.0  PROT 6.3*  ALBUMIN 3.4*   No results for input(s): LIPASE, AMYLASE in the last 168 hours. No results for input(s): AMMONIA in the last 168 hours.  CBC:  Recent Labs Lab 06/04/16 1212 06/05/16 0234 06/06/16 0704 06/07/16 0354 06/08/16 0350 06/09/16 0337  WBC 9.6 9.0 15.7* 12.5* 8.7 10.9*  NEUTROABS 7.5  --   --  10.1*  --   --   HGB 13.1 12.3* 12.8* 11.8* 11.6* 11.9*  HCT 42.6 40.3 42.2 39.6 38.6* 39.8  MCV 83.9 83.6 84.1 83.9 84.1 84.1  PLT PLATELET CLUMPS NOTED ON SMEAR, COUNT APPEARS ADEQUATE 180 216 199 174 208    Cardiac Enzymes:  Recent Labs Lab 06/04/16 1629 06/04/16 2103 06/05/16 0234  TROPONINI 0.05* 0.04* 0.04*    BNP: BNP (last 3 results)  Recent Labs  10/23/15 1831 06/04/16 1155  BNP 47.7 462.5*    ProBNP (last 3 results) No results for input(s): PROBNP in the last 8760 hours.    Other results:  Imaging: Dg Chest Port 1 View  Result Date: 06/08/2016 CLINICAL DATA:  Congestive heart failure EXAM: PORTABLE CHEST 1 VIEW COMPARISON:  June 06, 2016 FINDINGS: Central catheter tip is at the cavoatrial junction. No  pneumothorax. There are small pleural effusions bilaterally. There is interstitial edema. There is cardiomegaly with pulmonary venous hypertension. There is mild bibasilar atelectasis. There is patchy airspace consolidation in the left base. No evident bone lesions. No adenopathy. IMPRESSION: Evidence of congestive heart failure. Mild bibasilar atelectasis. Question alveolar edema versus patchy pneumonia left base. No pneumothorax. There appears to be slightly less edema compared to most recent study. Electronically Signed   By: Lowella Grip III M.D.   On: 06/08/2016 07:12     Medications:     Scheduled Medications: . atorvastatin  40 mg Oral Daily  . azithromycin  500 mg Oral Q24H  . bisoprolol  5 mg Oral Daily  . buPROPion  150 mg Oral BID  . Chlorhexidine Gluconate Cloth  6 each Topical Daily  . Chlorhexidine Gluconate Cloth  6 each Topical Q0600  . citalopram  40 mg Oral Daily  . digoxin  0.125 mg Oral Daily  . docusate sodium  100 mg Oral BID  . furosemide  80 mg Intravenous BID  . guaiFENesin  1,200 mg Oral BID  . insulin aspart  0-20 Units Subcutaneous TID WC  . insulin aspart  0-5 Units Subcutaneous QHS  . insulin glargine  80 Units Subcutaneous QHS  . isosorbide mononitrate  30 mg Oral Daily  . mouth rinse  15 mL Mouth Rinse BID  . metolazone  2.5 mg Oral Once  . mometasone-formoterol  2 puff Inhalation BID  . mupirocin ointment  1 application Nasal BID  . polyethylene glycol  17 g Oral Daily  . potassium chloride  40 mEq Oral BID  . predniSONE  20 mg Oral Q breakfast  . rivaroxaban  20 mg Oral Q supper  . sodium chloride flush  10-40 mL Intracatheter Q12H  . spironolactone  25 mg Oral Daily  . tamsulosin  0.4 mg Oral QPC supper    Infusions: . sodium chloride Stopped (06/06/16 1100)    PRN Medications: sodium chloride, acetaminophen **OR** acetaminophen, albuterol, ipratropium-albuterol, ondansetron **OR** ondansetron (ZOFRAN) IV, sodium chloride  flush   Assessment:   1. Acute on chronic hypoxemic respiratory failure: Patient has COPD, wears 2L O2 by nasal cannula normally.  CT chest 5/29 read as PNA but  may have been more CHF, and patient we thought to have COPD exacerbation with wheezing.  However, he was also volume overloaded and in atrial fibrillation.  Suspect respiratory failure is multifactorial (AECOPD, acute systolic CHF). He is doing better with combination of diuresis, steroids, and bronchodilators.  - Continue bronchodilators.  - Now off Solumedrol and on prednisone.  - Stop azithromycin today, has had 5 doses already.  2. Acute systolic CHF: He has been volume overloaded on exam, now diuresing.  CXR has shown pulmonary edema.  Echo 6/3 showed fall in EF to 25-30%.  CVP 13 today, volume overload on exam.  He diuresed well yesterday, weight coming down (18 lbs so far).  Co-ox 54% yesterday.  - Add digoxin 0.125 with marginal co-ox.  If creatinine continues to fall, will leave at this dose.  If rises, may need lower dose.  - Lasix 80 mg IV bid and will give dose of metolazone 2.5 mg x 1 again.  Hopefully this will be last day of aggressive diuresis.  - Significant fall in EF, no chest pain.  He has a new LBBB.  Will plan left and right heart catheterization after full diuresis.  We discussed risks/benefits of this today and he agrees to proceed.  Will hold Xarelto today with cath on Wednesday.  3. AKI: BUN/creatinine trending down.  Follow closely with diuresis.  Probably needs 1 more day.  4. PNA: Questionable on chest CT.  Cultures negative, afebrile.  He has completed azithromycin. 5. Atrial fibrillation: History of PAF, has not missed Xarelto.  He was in atrial fibrillation initially but now back in NSR.   - Given baseline COPD would avoid amiodarone use.  - Hold Xarelto for cath tomorrow.  - With fall in EF, stopped diltiazem CD and now using bisoprolol (more beta-1 selective given COPD).  6. CAD: s/p CABG.  Very mild  troponin elevation with no trend, suspect demand ischemia.  However, he has a new LBBB and a fall in EF from prior.  I do not think that he had ACS with plaque rupture triggering this event, but certainly could have had worsening of his CAD in the near past.  - No ASA given Xarelto use. Continue statin.  - As above, will plan L/RHC prior to discharge this admission.  7. 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  EF 6/3 was down to 25-30%.   - Continue spironolactone.  - Continue bisoprolol 5 daily (beta-1 selective with COPD).  - Add digoxin today as above.  - Eventually would like to get him on ARB but will hold off with recent rise in creatinine.   Length of Stay: 5   Loralie Champagne MD 06/09/2016, 7:48 AM  Advanced Heart Failure Team Pager [phone] (M-F; 7a - 4p)  Please contact [facility] for night-coverage after hours (4p -7a ) and weekends on [email or web address]

## 2016-06-09 NOTE — Progress Notes (Signed)
Physical Therapy Treatment Patient Details Name: Joshua [name] MRN: [id number] DOB: 29-Aug-1940 Today's Date: 06/09/2016    History of Present Illness 76 yo admitted with acute on chronic respiratory distress. PMHx: COPD, HTN, DM, HLD, obesity, CABG, CAD, CKD, HF    PT Comments    Patient is making gradual progress toward mobility goals. Pt encouraged to increase mobilization and time OOB and educated on benefits. Pt unable to maintain O2 sat 90 or > on 6L O2 via [place]. Continue to progress as tolerated with anticipated d/c home with HHPT.   Follow Up Recommendations  Home health PT     Equipment Recommendations  None recommended by PT    Recommendations for Other Services       Precautions / Restrictions Precautions Precautions: Fall Precaution Comments: watch sats Restrictions Weight Bearing Restrictions: No    Mobility  Bed Mobility Overal bed mobility: Modified Independent             General bed mobility comments: pt sitting EOB upon arrival; increased time and effort to return to supine  Transfers Overall transfer level: Needs assistance Equipment used: Rolling walker (2 wheeled) Transfers: Sit to/from Stand Sit to Stand: Min guard         General transfer comment: cues for safe hand placement and min guard for safety  Ambulation/Gait Ambulation/Gait assistance: Min assist Ambulation Distance (Feet): 100 Feet Assistive device: Rolling walker (2 wheeled) Gait Pattern/deviations: Step-through pattern;Decreased stride length;Trunk flexed Gait velocity: decreased   General Gait Details: cues for posture and breathing; min A to steady; pt on 6L O2 via [place] with SpO2 down to 88% with mobility   Stairs            Wheelchair Mobility    Modified Rankin (Stroke Patients Only)       Balance Overall balance assessment: Needs assistance Sitting-balance support: No upper extremity supported;Feet supported Sitting balance-Leahy Scale: Good      Standing balance support: Bilateral upper extremity supported;During functional activity Standing balance-Leahy Scale: Poor Standing balance comment: relies on RW for balance                             Cognition Arousal/Alertness: Awake/alert Behavior During Therapy: WFL for tasks assessed/performed Overall Cognitive Status: Within Functional Limits for tasks assessed                                        Exercises      General Comments General comments (skin integrity, edema, etc.): wife present in room; pt educated on benefits of increased mobilization      Pertinent Vitals/Pain Pain Assessment: No/denies pain    Home Living                      Prior Function            PT Goals (current goals can now be found in the care plan section) Progress towards PT goals: Progressing toward goals    Frequency    Min 3X/week      PT Plan Current plan remains appropriate    Co-evaluation              AM-PAC PT "6 Clicks" Daily Activity  Outcome Measure  Difficulty turning over in bed (including adjusting bedclothes, sheets and blankets)?: A Little Difficulty moving from lying on  back to sitting on the side of the bed? : A Little Difficulty sitting down on and standing up from a chair with arms (e.g., wheelchair, bedside commode, etc,.)?: A Little Help needed moving to and from a bed to chair (including a wheelchair)?: A Little Help needed walking in hospital room?: A Little Help needed climbing 3-5 steps with a railing? : A Little 6 Click Score: 18    End of Session Equipment Utilized During Treatment: Gait belt;Oxygen Activity Tolerance: Patient tolerated treatment well Patient left: with call bell/phone within reach;in bed;with family/visitor present Nurse Communication: Mobility status PT Visit Diagnosis: Muscle weakness (generalized) (M62.81);Difficulty in walking, not elsewhere classified (R26.2)     Time:  6294-7654 PT Time Calculation (min) (ACUTE ONLY): 20 min  Charges:  $Gait Training: 8-22 mins                    G Codes:       Joshua [name], PTA Pager: [phone]     Joshua [name] 06/09/2016, 3:10 PM

## 2016-06-09 NOTE — Progress Notes (Signed)
Pt states he has watched heart cath video

## 2016-06-09 NOTE — Progress Notes (Signed)
[place] to see pt to walk. Just walked with PT per RN and pt asleep now. Will continue to follow. Graylon Good RN BSN 06/09/2016 2:58 PM

## 2016-06-09 NOTE — Progress Notes (Addendum)
PULMONARY / CRITICAL CARE MEDICINE   Name: Joshua [name] MRN: [id number] DOB: [birth year]    ADMISSION DATE:  06/04/2016 CONSULTATION DATE:  06/05/2016  REFERRING MD:  Dr. Ree Kida  CHIEF COMPLAINT:  Dyspnea   HISTORY OF PRESENT ILLNESS:   76 yo male presented with progressive dyspnea.  He is followed in pulmonary office for COPD and asbestos related lung disease.  He has PMHx of combined CHF and CAD.  Required Bipap and transferred to ICU.  SUBJECTIVE:  Breathing better.  Denies chest pain.  Not as much cough.  VITAL SIGNS: BP 121/63   Pulse 80   Temp 97.8 F (36.6 C) (Oral)   Resp 17   Ht 6' (1.829 m)   Wt 236 lb 12.4 oz (107.4 kg)   SpO2 98%   BMI 32.11 kg/m   INTAKE / OUTPUT: I/O last 3 completed shifts: In: 2180 [P.O.:2160; I.V.:20] Out: 5925 [Urine:5925]  PHYSICAL EXAMINATION:  General - pleasant Eyes - pupils reactive ENT - no sinus tenderness, no oral exudate, no LAN Cardiac - regular, no murmur Chest - no wheeze, rales Abd - soft, non tender Ext - no edema Skin - no rashes Neuro - normal strength Psych - normal mood   LABS:  BMET  Recent Labs Lab 06/07/16 0354 06/08/16 0350 06/09/16 0337  NA 137 139 132*  K 3.1* 3.3* 3.9  CL 97* 95* 87*  CO2 30 33* 37*  BUN 68* 64* 57*  CREATININE 1.42* 1.37* 1.29*  GLUCOSE 254* 215* 255*    Electrolytes  Recent Labs Lab 06/04/16 1629  06/07/16 0354 06/08/16 0350 06/09/16 0337  CALCIUM  --   < > 8.2* 8.3* 8.3*  MG 2.1  --   --   --   --   PHOS 4.5  --   --   --   --   < > = values in this interval not displayed.  CBC  Recent Labs Lab 06/07/16 0354 06/08/16 0350 06/09/16 0337  WBC 12.5* 8.7 10.9*  HGB 11.8* 11.6* 11.9*  HCT 39.6 38.6* 39.8  PLT 199 174 208    Coag's  Recent Labs Lab 06/04/16 1212  INR 1.28    Sepsis Markers  Recent Labs Lab 06/04/16 1242 06/04/16 1654  LATICACIDVEN 2.64* 2.52*    ABG  Recent Labs Lab 06/05/16 1638  PHART 7.296*  PCO2ART 50.9*   PO2ART 74.3*    Liver Enzymes  Recent Labs Lab 06/04/16 1212  AST 25  ALT 18  ALKPHOS 81  BILITOT 1.0  ALBUMIN 3.4*    Cardiac Enzymes  Recent Labs Lab 06/04/16 1629 06/04/16 2103 06/05/16 0234  TROPONINI 0.05* 0.04* 0.04*    Glucose  Recent Labs Lab 06/07/16 2139 06/08/16 0754 06/08/16 1135 06/08/16 1603 06/08/16 1632 06/08/16 2135  GLUCAP 292* 182* 188* 383* 377* 322*    Imaging No results found.   STUDIES:  HRCT chest 5/29 >> consolidation RUL with mild patchy GGO, b/l pleural plaques, mild subpleural reticulation, atherosclerosis ECHO 6/2 >> mild LVH, EF 25 to 30%, grade 2 DD, mod MR, PAS 48 mmHg  CULTURES: 5/31 BCx 2 >>  ANTIBIOTICS: Azithromycin 5/31 >> 6/04 Rocephin 6/1 > 6/1  SIGNIFICANT EVENTS: 5/31  Admit 6/01  Tx to ICU   LINES/TUBES: PICC 6/2 >>   DISCUSSION: 76 year old admitted with pulmonary edema +/-AECOPD causing acute respiratory failure with hypoxemia.  ASSESSMENT / PLAN:  Acute on chronic hypoxic respiratory failure. COPD exacerbation. Hx of asbestos related lung disease. 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 PAF. Hx of HTN, HLD. - keep negative fluid balance  DVT prophylaxis - xarelto SUP - not indicated Nutrition - heart healthy Goals of care - Okay with intubation.  No CPR, no defibrillation.  Updated pt's family at bedside  I have scheduled him for pulmonary follow up visit with Eric Form on Wednesday, June 20 at 3 pm.  He can then have follow up with Dr. Vaughan Browner afterward.  PCCM will sign off.  Please call if additional help needed while he is in hospital.  Chesley Mires, MD [place] 06/09/2016, 11:08 AM Pager:  [phone] After 3pm call: [phone]

## 2016-06-09 NOTE — Progress Notes (Signed)
Inpatient Diabetes Program Recommendations  AACE/ADA: New Consensus Statement on Inpatient Glycemic Control (2015)  Target Ranges:  Prepandial:   less than 140 mg/dL      Peak postprandial:   less than 180 mg/dL (1-2 hours)      Critically ill patients:  140 - 180 mg/dL   Lab Results  Component Value Date   GLUCAP 322 (H) 06/08/2016   HGBA1C 7.3 (H) 06/04/2016    Review of Glycemic Control Results for Joshua [name], Joshua [name] (MRN [id number]) as of 06/09/2016 08:50  Ref. Range 06/08/2016 16:03 06/08/2016 16:32 06/08/2016 21:35  Glucose-Capillary Latest Ref Range: 65 - 99 mg/dL 383 (H) 377 (H) 322 (H)   Inpatient Diabetes Program Recommendations:  Noted hyperglycemia. Please consider: -Novolog 8 units tid meal coverage if eats 50% meal  Thank you, Bethena Roys E. Yuta Cipollone, RN, MSN, CDE  Diabetes Coordinator Inpatient Glycemic Control Team Team Pager [phone] (8am-5pm) 06/09/2016 8:51 AM

## 2016-06-10 ENCOUNTER — Encounter (HOSPITAL_COMMUNITY)
Admission: EM | Disposition: A | Discharge status: Home Health | Payer: Self-pay | Source: Home / Self Care | Attending: Cardiology

## 2016-06-10 DIAGNOSIS — I2510 Atherosclerotic heart disease of native coronary artery without angina pectoris: Secondary | ICD-10-CM

## 2016-06-10 DIAGNOSIS — I509 Heart failure, unspecified: Secondary | ICD-10-CM

## 2016-06-10 DIAGNOSIS — I4891 Unspecified atrial fibrillation: Secondary | ICD-10-CM

## 2016-06-10 HISTORY — PX: RIGHT/LEFT HEART CATH AND CORONARY ANGIOGRAPHY: CATH118266

## 2016-06-10 LAB — POCT I-STAT 3, VENOUS BLOOD GAS (G3P V)
ACID-BASE EXCESS: 16 mmol/L — AB (ref 0.0–2.0)
Acid-Base Excess: 18 mmol/L — ABNORMAL HIGH (ref 0.0–2.0)
BICARBONATE: 46.4 mmol/L — AB (ref 20.0–28.0)
Bicarbonate: 43.7 mmol/L — ABNORMAL HIGH (ref 20.0–28.0)
O2 Saturation: 58 %
O2 Saturation: 61 %
PCO2 VEN: 67.7 mmHg — AB (ref 44.0–60.0)
PH VEN: 7.43 (ref 7.250–7.430)
PH VEN: 7.444 — AB (ref 7.250–7.430)
PO2 VEN: 31 mmHg — AB (ref 32.0–45.0)
PO2 VEN: 32 mmHg (ref 32.0–45.0)
TCO2: 46 mmol/L (ref 0–100)
TCO2: 48 mmol/L (ref 0–100)
pCO2, Ven: 65.8 mmHg — ABNORMAL HIGH (ref 44.0–60.0)

## 2016-06-10 LAB — POCT I-STAT 3, ART BLOOD GAS (G3+)
Acid-Base Excess: 16 mmol/L — ABNORMAL HIGH (ref 0.0–2.0)
Bicarbonate: 42.5 mmol/L — ABNORMAL HIGH (ref 20.0–28.0)
O2 Saturation: 96 %
PCO2 ART: 57.2 mmHg — AB (ref 32.0–48.0)
PH ART: 7.479 — AB (ref 7.350–7.450)
PO2 ART: 80 mmHg — AB (ref 83.0–108.0)
TCO2: 44 mmol/L (ref 0–100)

## 2016-06-10 LAB — GLUCOSE, CAPILLARY
GLUCOSE-CAPILLARY: 360 mg/dL — AB (ref 65–99)
Glucose-Capillary: 273 mg/dL — ABNORMAL HIGH (ref 65–99)
Glucose-Capillary: 145 mg/dL — ABNORMAL HIGH (ref 65–99)
Glucose-Capillary: 123 mg/dL — ABNORMAL HIGH (ref 65–99)
Glucose-Capillary: 116 mg/dL — ABNORMAL HIGH (ref 65–99)
Glucose-Capillary: 343 mg/dL — ABNORMAL HIGH (ref 65–99)

## 2016-06-10 LAB — CBC
HCT: 41.8 % (ref 39.0–52.0)
Hemoglobin: 12.5 g/dL — ABNORMAL LOW (ref 13.0–17.0)
MCH: 25.2 pg — ABNORMAL LOW (ref 26.0–34.0)
MCHC: 29.9 g/dL — ABNORMAL LOW (ref 30.0–36.0)
MCV: 84.3 fL (ref 78.0–100.0)
PLATELETS: 217 10*3/uL (ref 150–400)
RBC: 4.96 MIL/uL (ref 4.22–5.81)
RDW: 15.8 % — ABNORMAL HIGH (ref 11.5–15.5)
WBC: 11 10*3/uL — AB (ref 4.0–10.5)

## 2016-06-10 LAB — COOXEMETRY PANEL
Carboxyhemoglobin: 1.3 % (ref 0.5–1.5)
Methemoglobin: 1.2 % (ref 0.0–1.5)
O2 Saturation: 56.7 %
TOTAL HEMOGLOBIN: 14.1 g/dL (ref 12.0–16.0)

## 2016-06-10 LAB — BASIC METABOLIC PANEL
Anion gap: 12 (ref 5–15)
BUN: 60 mg/dL — ABNORMAL HIGH (ref 6–20)
CALCIUM: 8.8 mg/dL — AB (ref 8.9–10.3)
CO2: 36 mmol/L — ABNORMAL HIGH (ref 22–32)
CREATININE: 1.17 mg/dL (ref 0.61–1.24)
Chloride: 90 mmol/L — ABNORMAL LOW (ref 101–111)
GFR, EST NON AFRICAN AMERICAN: 58 mL/min — AB (ref >60)
Glucose, Bld: 208 mg/dL — ABNORMAL HIGH (ref 65–99)
Potassium: 3.5 mmol/L (ref 3.5–5.1)
SODIUM: 138 mmol/L (ref 135–145)

## 2016-06-10 SURGERY — RIGHT/LEFT HEART CATH AND CORONARY ANGIOGRAPHY
Anesthesia: LOCAL

## 2016-06-10 MED ORDER — LIDOCAINE HCL (PF) 1 % IJ SOLN
INTRAMUSCULAR | Status: AC
  Filled 2016-06-10: qty 30

## 2016-06-10 MED ORDER — SODIUM CHLORIDE 0.9% FLUSH
3.0000 mL | Freq: Two times a day (BID) | INTRAVENOUS | Status: DC
  Administered 2016-06-10: 3 mL via INTRAVENOUS

## 2016-06-10 MED ORDER — IOPAMIDOL (ISOVUE-370) INJECTION 76%
INTRAVENOUS | Status: DC | PRN
  Administered 2016-06-10: 70 mL via INTRA_ARTERIAL

## 2016-06-10 MED ORDER — MIDAZOLAM HCL 2 MG/2ML IJ SOLN
INTRAMUSCULAR | Status: DC | PRN
  Administered 2016-06-10: 0.5 mg via INTRAVENOUS

## 2016-06-10 MED ORDER — HEPARIN (PORCINE) IN NACL 2-0.9 UNIT/ML-% IJ SOLN
INTRAMUSCULAR | Status: AC
  Filled 2016-06-10: qty 1000

## 2016-06-10 MED ORDER — HEPARIN (PORCINE) IN NACL 2-0.9 UNIT/ML-% IJ SOLN
INTRAMUSCULAR | Status: AC | PRN
  Administered 2016-06-10: 1000 mL

## 2016-06-10 MED ORDER — SODIUM CHLORIDE 0.9 % IV SOLN: 250.0000 mL | INTRAVENOUS | Status: DC | PRN

## 2016-06-10 MED ORDER — FENTANYL CITRATE (PF) 100 MCG/2ML IJ SOLN
INTRAMUSCULAR | Status: DC | PRN
  Administered 2016-06-10: 12.5 ug via INTRAVENOUS

## 2016-06-10 MED ORDER — SODIUM CHLORIDE 0.9% FLUSH: 3.0000 mL | INTRAVENOUS | Status: DC | PRN

## 2016-06-10 MED ORDER — LIDOCAINE HCL (PF) 1 % IJ SOLN
INTRAMUSCULAR | Status: DC | PRN
  Administered 2016-06-10: 30 mL
  Administered 2016-06-10: 5 mL via INTRADERMAL

## 2016-06-10 MED ORDER — FENTANYL CITRATE (PF) 100 MCG/2ML IJ SOLN
INTRAMUSCULAR | Status: AC
  Filled 2016-06-10: qty 2

## 2016-06-10 MED ORDER — SODIUM CHLORIDE 0.9% FLUSH
3.0000 mL | Freq: Two times a day (BID) | INTRAVENOUS | Status: DC
  Administered 2016-06-11: 3 mL via INTRAVENOUS

## 2016-06-10 MED ORDER — ACETAMINOPHEN 325 MG PO TABS: 650.0000 mg | ORAL_TABLET | ORAL | Status: DC | PRN

## 2016-06-10 MED ORDER — ONDANSETRON HCL 4 MG/2ML IJ SOLN: 4.0000 mg | Freq: Four times a day (QID) | INTRAMUSCULAR | Status: DC | PRN

## 2016-06-10 MED ORDER — TORSEMIDE 20 MG PO TABS
40.0000 mg | ORAL_TABLET | Freq: Every day | ORAL | Status: DC
  Administered 2016-06-10 – 2016-06-11 (×2): 40 mg via ORAL
  Filled 2016-06-10 (×3): qty 2

## 2016-06-10 MED ORDER — MIDAZOLAM HCL 2 MG/2ML IJ SOLN
INTRAMUSCULAR | Status: AC
  Filled 2016-06-10: qty 2

## 2016-06-10 MED ORDER — RIVAROXABAN 20 MG PO TABS
20.0000 mg | ORAL_TABLET | Freq: Every day | ORAL | Status: DC
  Administered 2016-06-11: 20 mg via ORAL
  Filled 2016-06-10: qty 1

## 2016-06-10 SURGICAL SUPPLY — 13 items
CATH INFINITI 5FR MULTPACK ANG (CATHETERS) ×1 IMPLANT
CATH SWAN GANZ 7F STRAIGHT (CATHETERS) ×1 IMPLANT
KIT HEART LEFT (KITS) ×2 IMPLANT
PACK CARDIAC CATHETERIZATION (CUSTOM PROCEDURE TRAY) ×2 IMPLANT
SHEATH PINNACLE 5F 10CM (SHEATH) ×1 IMPLANT
SHEATH PINNACLE 7F 10CM (SHEATH) ×1 IMPLANT
TRANSDUCER W/STOPCOCK (MISCELLANEOUS) ×2 IMPLANT
TUBING ART PRESS 72  MALE/FEM (TUBING) ×1
TUBING ART PRESS 72 MALE/FEM (TUBING) IMPLANT
TUBING CIL FLEX 10 FLL-RA (TUBING) ×2 IMPLANT
WIRE EMERALD 3MM-J .025X260CM (WIRE) ×1 IMPLANT
WIRE EMERALD 3MM-J .035X150CM (WIRE) ×1 IMPLANT
WIRE HI TORQ VERSACORE-J 145CM (WIRE) ×1 IMPLANT

## 2016-06-10 NOTE — Progress Notes (Signed)
Patient ID: Joshua [name], male   DOB: December 04, 1940, 76 y.o.   MRN: [id number]    Advanced Heart Failure Rounding Note   Subjective:    Developed respiratory failure 6/1 due to combination AECOPD and acute HF. Started on Bipap and moved to ICU.   Remains on IV lasix. Got dose metolazone again yesterday. Urine output excellent. Weight down 2 pounds. Creatinine slightly improved.  1.6->1.4->1.37->1.29->1.17.   He is in NSR today.   CVP 8-9.   Breathing improved.  He has not had chest pain.   Echo (6/3): EF 25-30%, severe hypokinesis, moderate MR, mildly dilated RV with mildly decreased systolic function, PASP 48 mmHg.   Objective:   Weight Range:  Vital Signs:   Temp:  [97.5 F (36.4 C)-98.5 F (36.9 C)] 98.5 F (36.9 C) (06/06 0400) Pulse Rate:  [41-81] 72 (06/06 0700) Resp:  [13-23] 15 (06/06 0700) BP: (107-131)/(60-79) 130/75 (06/06 0700) SpO2:  [93 %-100 %] 96 % (06/06 0700) Weight:  [234 lb 12.6 oz (106.5 kg)] 234 lb 12.6 oz (106.5 kg) (06/06 0500) Last BM Date: 06/08/16  Weight change: Filed Weights   06/08/16 0416 06/09/16 0300 06/10/16 0500  Weight: 241 lb 9.6 oz (109.6 kg) 236 lb 12.4 oz (107.4 kg) 234 lb 12.6 oz (106.5 kg)    Intake/Output:   Intake/Output Summary (Last 24 hours) at 06/10/16 0732 Last data filed at 06/10/16 0600  Gross per 24 hour  Intake             1700 ml  Output             3960 ml  Net            -2260 ml     Physical Exam: General: NAD, 2L [place] HEENT: normal. anicteric Neck: Thick, JVP 8 cm.  Cor: PMI nonpalpable. Regular S1S2. No obvious murmurs, no S3/S4.  Lungs: Barrel chested. Decreased breath sounds bilaterally but no wheezing.   Abdomen: obese soft, nontender, mildly distended. No hepatosplenomegaly. No bruits or masses. Good bowel sounds. Extremities: no cyanosis, clubbing, rash. Trace ankle edema. Warm Neuro: alert & orientedx3, cranial nerves grossly intact. moves all 4 extremities w/o difficulty. Affect pleasant    Telemetry: Personally reviewed, NSR in 80s with LBBB   Labs: Basic Metabolic Panel:  Recent Labs Lab 06/04/16 1629  06/06/16 0704 06/07/16 0354 06/08/16 0350 06/09/16 0337 06/10/16 0415  NA  --   < > 137 137 139 132* 138  K  --   < > 3.6 3.1* 3.3* 3.9 3.5  CL  --   < > 99* 97* 95* 87* 90*  CO2  --   < > 23 30 33* 37* 36*  GLUCOSE  --   < > 164* 254* 215* 255* 208*  BUN  --   < > 55* 68* 64* 57* 60*  CREATININE  --   < > 1.60* 1.42* 1.37* 1.29* 1.17  CALCIUM  --   < > 8.2* 8.2* 8.3* 8.3* 8.8*  MG 2.1  --   --   --   --   --   --   PHOS 4.5  --   --   --   --   --   --   < > = values in this interval not displayed.  Liver Function Tests:  Recent Labs Lab 06/04/16 1212  AST 25  ALT 18  ALKPHOS 81  BILITOT 1.0  PROT 6.3*  ALBUMIN 3.4*   No results for input(s): LIPASE,  AMYLASE in the last 168 hours. No results for input(s): AMMONIA in the last 168 hours.  CBC:  Recent Labs Lab 06/04/16 1212  06/06/16 0704 06/07/16 0354 06/08/16 0350 06/09/16 0337 06/10/16 0415  WBC 9.6  < > 15.7* 12.5* 8.7 10.9* 11.0*  NEUTROABS 7.5  --   --  10.1*  --   --   --   HGB 13.1  < > 12.8* 11.8* 11.6* 11.9* 12.5*  HCT 42.6  < > 42.2 39.6 38.6* 39.8 41.8  MCV 83.9  < > 84.1 83.9 84.1 84.1 84.3  PLT PLATELET CLUMPS NOTED ON SMEAR, COUNT APPEARS ADEQUATE  < > 216 199 174 208 217  < > = values in this interval not displayed.  Cardiac Enzymes:  Recent Labs Lab 06/04/16 1629 06/04/16 2103 06/05/16 0234  TROPONINI 0.05* 0.04* 0.04*    BNP: BNP (last 3 results)  Recent Labs  10/23/15 1831 06/04/16 1155  BNP 47.7 462.5*    ProBNP (last 3 results) No results for input(s): PROBNP in the last 8760 hours.    Other results:  Imaging: No results found.   Medications:     Scheduled Medications: . atorvastatin  40 mg Oral Daily  . bisoprolol  5 mg Oral Daily  . buPROPion  150 mg Oral BID  . Chlorhexidine Gluconate Cloth  6 each Topical Daily  . citalopram  20  mg Oral Daily  . digoxin  0.125 mg Oral Daily  . docusate sodium  100 mg Oral BID  . guaiFENesin  1,200 mg Oral BID  . insulin aspart  0-20 Units Subcutaneous TID WC  . insulin aspart  0-5 Units Subcutaneous QHS  . insulin glargine  80 Units Subcutaneous QHS  . isosorbide mononitrate  30 mg Oral Daily  . mouth rinse  15 mL Mouth Rinse BID  . mometasone-formoterol  2 puff Inhalation BID  . mupirocin ointment  1 application Nasal BID  . polyethylene glycol  17 g Oral Daily  . potassium chloride  40 mEq Oral BID  . predniSONE  15 mg Oral Q breakfast  . sodium chloride flush  10-40 mL Intracatheter Q12H  . sodium chloride flush  3 mL Intravenous Q12H  . spironolactone  25 mg Oral Daily  . tamsulosin  0.4 mg Oral QPC supper    Infusions: . sodium chloride Stopped (06/06/16 1100)  . sodium chloride    . sodium chloride      PRN Medications: sodium chloride, sodium chloride, acetaminophen **OR** acetaminophen, albuterol, ipratropium-albuterol, ondansetron **OR** ondansetron (ZOFRAN) IV, sodium chloride flush, sodium chloride flush   Assessment:   1. Acute on chronic hypoxemic respiratory failure: Patient has COPD, wears 2L O2 by nasal cannula normally.  CT chest 5/29 read as PNA but may have been more CHF, and patient we thought to have COPD exacerbation with wheezing.  However, he was also volume overloaded and in atrial fibrillation.  Suspect respiratory failure is multifactorial (AECOPD, acute systolic CHF). He is doing better with combination of diuresis, steroids, and bronchodilators.  - Continue bronchodilators.  - Now off Solumedrol and on prednisone, tapering down.  - He has completed azithromycin.  2. Acute systolic CHF:  Ischemic cardiomyopathy.  He has been volume overloaded on exam, now diuresing.  CXR has shown pulmonary edema.  Echo 6/3 showed fall in EF to 25-30%.  CVP 8-9 today, volume status looks improved.  He diuresed well yesterday, weight coming down (20 lbs so far).    - Send co-ox again today,  now on digoxin.  - Hold Lasix today pre-cath, will likely start po diuretic tomorrow.   - Significant fall in EF, no chest pain.  He has a new LBBB.  Will plan left and right heart catheterization today.  We discussed risks/benefits of this again today and he agrees to proceed.  Xarelto has been on hold for cath.  - Eventually would like to get him on ARB but will hold off with recent rise in creatinine.  3. AKI: BUN/creatinine trending down.  Follow closely with diuresis.  Hold diuretics for cath, likely resume tomorrow.   4. PNA: Questionable on chest CT.  Cultures negative, afebrile.  He has completed azithromycin. 5. Atrial fibrillation: History of PAF, has not missed Xarelto.  He was in atrial fibrillation initially but now back in NSR.   - Given baseline COPD would avoid amiodarone use.  - Hold Xarelto for cath, likely resume tomorrow.  - With fall in EF, stopped diltiazem CD and now using bisoprolol (more beta-1 selective given COPD).  6. CAD: s/p CABG.  Very mild troponin elevation with no trend, suspect demand ischemia.  However, he has a new LBBB and a fall in EF from prior.  I do not think that he had ACS with plaque rupture triggering this event, but certainly could have had worsening of his CAD in the near past.  - No ASA given Xarelto use. Continue statin.  - As above, will plan [facility] today.    Length of Stay: 6  Loralie Champagne MD 06/10/2016, 7:32 AM  Advanced Heart Failure Team Pager [phone] (M-F; 7a - 4p)  Please contact [facility] for night-coverage after hours (4p -7a ) and weekends on [email or web address]

## 2016-06-10 NOTE — Progress Notes (Signed)
OT Cancellation Note  Patient Details Name: LESSLIE MCKEEHAN MRN: [id number] DOB: [birth year]   Cancelled Treatment:    Reason Eval/Treat Not Completed: Patient at procedure or test/ unavailable.  Pt in cath lab.  Will reattempt.  Breonna Gafford [place], OTR/L [phone]   Lucille Passy M 06/10/2016, 10:50 AM

## 2016-06-10 NOTE — Interval H&P Note (Signed)
History and Physical Interval Note:  06/10/2016 9:22 AM  Joshua [name]  has presented today for surgery, with the diagnosis of cm - chf  The various methods of treatment have been discussed with the patient and family. After consideration of risks, benefits and other options for treatment, the patient has consented to  Procedure(s): Right/Left Heart Cath and Coronary Angiography (N/A) as a surgical intervention .  The patient's history has been reviewed, patient examined, no change in status, stable for surgery.  I have reviewed the patient's chart and labs.  Questions were answered to the patient's satisfaction.     Joshua [name]

## 2016-06-10 NOTE — Progress Notes (Signed)
PT Cancellation Note  Patient Details Name: Joshua [name] MRN: [id number] DOB: [birth year]   Cancelled Treatment:    Reason Eval/Treat Not Completed: Patient not medically ready Pt on bed rest. PT will continue to follow acutely.    [place] April, PTA Pager: [phone]   06/10/2016, 2:46 PM

## 2016-06-10 NOTE — Progress Notes (Signed)
Physical Therapy Cancellation Note    06/10/16 1139  PT Visit Information  Last PT Received On 06/10/16  Reason Eval/Treat Not Completed Patient at procedure or test/unavailable; PT will check on pt later as time allows.   History of Present Illness 76 yo admitted with acute on chronic respiratory distress. PMHx: COPD, HTN, DM, HLD, obesity, CABG, CAD, CKD, HF   Joshua [name], PTA Pager: [phone]

## 2016-06-10 NOTE — H&P (View-Only) (Signed)
Patient ID: Joshua [name], male   DOB: December 04, 1940, 76 y.o.   MRN: [id number]    Advanced Heart Failure Rounding Note   Subjective:    Developed respiratory failure 6/1 due to combination AECOPD and acute HF. Started on Bipap and moved to ICU.   Remains on IV lasix. Got dose metolazone again yesterday. Urine output excellent. Weight down 2 pounds. Creatinine slightly improved.  1.6->1.4->1.37->1.29->1.17.   He is in NSR today.   CVP 8-9.   Breathing improved.  He has not had chest pain.   Echo (6/3): EF 25-30%, severe hypokinesis, moderate MR, mildly dilated RV with mildly decreased systolic function, PASP 48 mmHg.   Objective:   Weight Range:  Vital Signs:   Temp:  [97.5 F (36.4 C)-98.5 F (36.9 C)] 98.5 F (36.9 C) (06/06 0400) Pulse Rate:  [41-81] 72 (06/06 0700) Resp:  [13-23] 15 (06/06 0700) BP: (107-131)/(60-79) 130/75 (06/06 0700) SpO2:  [93 %-100 %] 96 % (06/06 0700) Weight:  [234 lb 12.6 oz (106.5 kg)] 234 lb 12.6 oz (106.5 kg) (06/06 0500) Last BM Date: 06/08/16  Weight change: Filed Weights   06/08/16 0416 06/09/16 0300 06/10/16 0500  Weight: 241 lb 9.6 oz (109.6 kg) 236 lb 12.4 oz (107.4 kg) 234 lb 12.6 oz (106.5 kg)    Intake/Output:   Intake/Output Summary (Last 24 hours) at 06/10/16 0732 Last data filed at 06/10/16 0600  Gross per 24 hour  Intake             1700 ml  Output             3960 ml  Net            -2260 ml     Physical Exam: General: NAD, 2L [place] HEENT: normal. anicteric Neck: Thick, JVP 8 cm.  Cor: PMI nonpalpable. Regular S1S2. No obvious murmurs, no S3/S4.  Lungs: Barrel chested. Decreased breath sounds bilaterally but no wheezing.   Abdomen: obese soft, nontender, mildly distended. No hepatosplenomegaly. No bruits or masses. Good bowel sounds. Extremities: no cyanosis, clubbing, rash. Trace ankle edema. Warm Neuro: alert & orientedx3, cranial nerves grossly intact. moves all 4 extremities w/o difficulty. Affect pleasant    Telemetry: Personally reviewed, NSR in 80s with LBBB   Labs: Basic Metabolic Panel:  Recent Labs Lab 06/04/16 1629  06/06/16 0704 06/07/16 0354 06/08/16 0350 06/09/16 0337 06/10/16 0415  NA  --   < > 137 137 139 132* 138  K  --   < > 3.6 3.1* 3.3* 3.9 3.5  CL  --   < > 99* 97* 95* 87* 90*  CO2  --   < > 23 30 33* 37* 36*  GLUCOSE  --   < > 164* 254* 215* 255* 208*  BUN  --   < > 55* 68* 64* 57* 60*  CREATININE  --   < > 1.60* 1.42* 1.37* 1.29* 1.17  CALCIUM  --   < > 8.2* 8.2* 8.3* 8.3* 8.8*  MG 2.1  --   --   --   --   --   --   PHOS 4.5  --   --   --   --   --   --   < > = values in this interval not displayed.  Liver Function Tests:  Recent Labs Lab 06/04/16 1212  AST 25  ALT 18  ALKPHOS 81  BILITOT 1.0  PROT 6.3*  ALBUMIN 3.4*   No results for input(s): LIPASE,  AMYLASE in the last 168 hours. No results for input(s): AMMONIA in the last 168 hours.  CBC:  Recent Labs Lab 06/04/16 1212  06/06/16 0704 06/07/16 0354 06/08/16 0350 06/09/16 0337 06/10/16 0415  WBC 9.6  < > 15.7* 12.5* 8.7 10.9* 11.0*  NEUTROABS 7.5  --   --  10.1*  --   --   --   HGB 13.1  < > 12.8* 11.8* 11.6* 11.9* 12.5*  HCT 42.6  < > 42.2 39.6 38.6* 39.8 41.8  MCV 83.9  < > 84.1 83.9 84.1 84.1 84.3  PLT PLATELET CLUMPS NOTED ON SMEAR, COUNT APPEARS ADEQUATE  < > 216 199 174 208 217  < > = values in this interval not displayed.  Cardiac Enzymes:  Recent Labs Lab 06/04/16 1629 06/04/16 2103 06/05/16 0234  TROPONINI 0.05* 0.04* 0.04*    BNP: BNP (last 3 results)  Recent Labs  10/23/15 1831 06/04/16 1155  BNP 47.7 462.5*    ProBNP (last 3 results) No results for input(s): PROBNP in the last 8760 hours.    Other results:  Imaging: No results found.   Medications:     Scheduled Medications: . atorvastatin  40 mg Oral Daily  . bisoprolol  5 mg Oral Daily  . buPROPion  150 mg Oral BID  . Chlorhexidine Gluconate Cloth  6 each Topical Daily  . citalopram  20  mg Oral Daily  . digoxin  0.125 mg Oral Daily  . docusate sodium  100 mg Oral BID  . guaiFENesin  1,200 mg Oral BID  . insulin aspart  0-20 Units Subcutaneous TID WC  . insulin aspart  0-5 Units Subcutaneous QHS  . insulin glargine  80 Units Subcutaneous QHS  . isosorbide mononitrate  30 mg Oral Daily  . mouth rinse  15 mL Mouth Rinse BID  . mometasone-formoterol  2 puff Inhalation BID  . mupirocin ointment  1 application Nasal BID  . polyethylene glycol  17 g Oral Daily  . potassium chloride  40 mEq Oral BID  . predniSONE  15 mg Oral Q breakfast  . sodium chloride flush  10-40 mL Intracatheter Q12H  . sodium chloride flush  3 mL Intravenous Q12H  . spironolactone  25 mg Oral Daily  . tamsulosin  0.4 mg Oral QPC supper    Infusions: . sodium chloride Stopped (06/06/16 1100)  . sodium chloride    . sodium chloride      PRN Medications: sodium chloride, sodium chloride, acetaminophen **OR** acetaminophen, albuterol, ipratropium-albuterol, ondansetron **OR** ondansetron (ZOFRAN) IV, sodium chloride flush, sodium chloride flush   Assessment:   1. Acute on chronic hypoxemic respiratory failure: Patient has COPD, wears 2L O2 by nasal cannula normally.  CT chest 5/29 read as PNA but may have been more CHF, and patient we thought to have COPD exacerbation with wheezing.  However, he was also volume overloaded and in atrial fibrillation.  Suspect respiratory failure is multifactorial (AECOPD, acute systolic CHF). He is doing better with combination of diuresis, steroids, and bronchodilators.  - Continue bronchodilators.  - Now off Solumedrol and on prednisone, tapering down.  - He has completed azithromycin.  2. Acute systolic CHF:  Ischemic cardiomyopathy.  He has been volume overloaded on exam, now diuresing.  CXR has shown pulmonary edema.  Echo 6/3 showed fall in EF to 25-30%.  CVP 8-9 today, volume status looks improved.  He diuresed well yesterday, weight coming down (20 lbs so far).    - Send co-ox again today,  now on digoxin.  - Hold Lasix today pre-cath, will likely start po diuretic tomorrow.   - Significant fall in EF, no chest pain.  He has a new LBBB.  Will plan left and right heart catheterization today.  We discussed risks/benefits of this again today and he agrees to proceed.  Xarelto has been on hold for cath.  - Eventually would like to get him on ARB but will hold off with recent rise in creatinine.  3. AKI: BUN/creatinine trending down.  Follow closely with diuresis.  Hold diuretics for cath, likely resume tomorrow.   4. PNA: Questionable on chest CT.  Cultures negative, afebrile.  He has completed azithromycin. 5. Atrial fibrillation: History of PAF, has not missed Xarelto.  He was in atrial fibrillation initially but now back in NSR.   - Given baseline COPD would avoid amiodarone use.  - Hold Xarelto for cath, likely resume tomorrow.  - With fall in EF, stopped diltiazem CD and now using bisoprolol (more beta-1 selective given COPD).  6. CAD: s/p CABG.  Very mild troponin elevation with no trend, suspect demand ischemia.  However, he has a new LBBB and a fall in EF from prior.  I do not think that he had ACS with plaque rupture triggering this event, but certainly could have had worsening of his CAD in the near past.  - No ASA given Xarelto use. Continue statin.  - As above, will plan [facility] today.    Length of Stay: 6  Loralie Champagne MD 06/10/2016, 7:32 AM  Advanced Heart Failure Team Pager [phone] (M-F; 7a - 4p)  Please contact [facility] for night-coverage after hours (4p -7a ) and weekends on [email or web address]

## 2016-06-10 NOTE — Progress Notes (Signed)
Site area: rt groin fa and fv sheaths Site Prior to Removal:  Level 0 Pressure Applied For: 20 minutes  Manual:   yes Patient Status During Pull:  stable Post Pull Site:  Level  0 Post Pull Instructions Given:  yes Post Pull Pulses Present: yes Dressing Applied:  Gauze and tegaderm Bedrest begins @ 1130 Comments:

## 2016-06-10 NOTE — Progress Notes (Signed)
Patient was on side on bed, stated that he did not want to use CPAP tonight. Stated he did not need it last night and did not want it tonight. RT made pt aware that if he changed his mind to call and we would place him on one.

## 2016-06-10 NOTE — Progress Notes (Signed)
CARDIAC REHAB PHASE I   Came to ambulate, pt in cath lab, will follow up later today as schedule permits.   Lenna Sciara, RN, BSN 06/10/2016 9:59 AM

## 2016-06-11 ENCOUNTER — Encounter (HOSPITAL_COMMUNITY): Payer: Self-pay | Admitting: Cardiology

## 2016-06-11 LAB — BASIC METABOLIC PANEL
ANION GAP: 15 (ref 5–15)
BUN: 57 mg/dL — ABNORMAL HIGH (ref 6–20)
CALCIUM: 8.9 mg/dL (ref 8.9–10.3)
CO2: 38 mmol/L — ABNORMAL HIGH (ref 22–32)
Chloride: 85 mmol/L — ABNORMAL LOW (ref 101–111)
Creatinine, Ser: 1.24 mg/dL (ref 0.61–1.24)
GFR calc Af Amer: >60 mL/min (ref >60)
GFR, EST NON AFRICAN AMERICAN: 54 mL/min — AB (ref >60)
Glucose, Bld: 98 mg/dL (ref 65–99)
POTASSIUM: 3.4 mmol/L — AB (ref 3.5–5.1)
Sodium: 138 mmol/L (ref 135–145)

## 2016-06-11 LAB — CBC
HEMATOCRIT: 45.5 % (ref 39.0–52.0)
HEMOGLOBIN: 13.6 g/dL (ref 13.0–17.0)
MCH: 25.3 pg — ABNORMAL LOW (ref 26.0–34.0)
MCHC: 29.9 g/dL — ABNORMAL LOW (ref 30.0–36.0)
MCV: 84.7 fL (ref 78.0–100.0)
Platelets: 253 10*3/uL (ref 150–400)
RBC: 5.37 MIL/uL (ref 4.22–5.81)
RDW: 16.1 % — ABNORMAL HIGH (ref 11.5–15.5)
WBC: 11.9 10*3/uL — AB (ref 4.0–10.5)

## 2016-06-11 LAB — GLUCOSE, CAPILLARY
GLUCOSE-CAPILLARY: 128 mg/dL — AB (ref 65–99)
Glucose-Capillary: 231 mg/dL — ABNORMAL HIGH (ref 65–99)

## 2016-06-11 MED ORDER — SPIRONOLACTONE 25 MG PO TABS: 25.0000 mg | ORAL_TABLET | Freq: Every day | ORAL | 6 refills | Status: DC

## 2016-06-11 MED ORDER — POTASSIUM CHLORIDE CRYS ER 20 MEQ PO TBCR: 20.0000 meq | EXTENDED_RELEASE_TABLET | Freq: Every day | ORAL | 6 refills | Status: DC

## 2016-06-11 MED ORDER — TORSEMIDE 20 MG PO TABS: 40.0000 mg | ORAL_TABLET | Freq: Every day | ORAL | 6 refills | Status: DC

## 2016-06-11 MED ORDER — PREDNISONE 5 MG PO TABS: ORAL_TABLET | ORAL | 0 refills | Status: DC

## 2016-06-11 MED ORDER — DIGOXIN 125 MCG PO TABS
0.1250 mg | ORAL_TABLET | Freq: Every day | ORAL | 6 refills | Status: DC
Start: 2016-06-12 — End: 2017-01-27

## 2016-06-11 MED ORDER — BISOPROLOL FUMARATE 5 MG PO TABS: 5.0000 mg | ORAL_TABLET | Freq: Every day | ORAL | 6 refills | Status: DC

## 2016-06-11 MED ORDER — MOMETASONE FURO-FORMOTEROL FUM 100-5 MCG/ACT IN AERO: 2.0000 | INHALATION_SPRAY | Freq: Two times a day (BID) | RESPIRATORY_TRACT | 2 refills | Status: DC

## 2016-06-11 MED ORDER — CITALOPRAM HYDROBROMIDE 20 MG PO TABS: 20.0000 mg | ORAL_TABLET | Freq: Every day | ORAL | 2 refills | Status: DC

## 2016-06-11 NOTE — Care Management Note (Signed)
Case Management Note  Patient Details  Name: Joshua [name] MRN: [id number] Date of Birth: 31-May-1940  Subjective/Objective:       Admitted with Resp Distress            Action/Plan: CM talked to patient with his mother at the bedside; PCP: Tammi Sou, MD; has private insurance with [facility] with prescription drug coverage; pharmacy of choice is [facility], no problem with medication and they can deliver his medication to his home if needed; [place] choice offered, pt/ mother requested [facility] [place] [facility]), [place] with [facility] called for arrangements; patient has home oxygen through [place], cane, rolater (walker with seat) at home;  Expected Discharge Date:  06/11/16               Expected Discharge Plan:  [place]  In-House Referral:   [facility]  Discharge planning Services  CM Consult  Post Acute Care Choice:    Choice offered to:  Patient  HH Arranged:  RN, Disease Management, PT [place] Agency:  [facility] (now [facility])  Status of Service:  In process, will continue to follow  Sherrilyn Rist [phone] 06/11/2016, 11:53 AM

## 2016-06-11 NOTE — Discharge Summary (Signed)
Advanced Heart Failure Discharge Note  Discharge Summary   Patient ID: Joshua [name] MRN: [id number], DOB/AGE: [birth year] 76 y.o. Admit date: 06/04/2016 D/C date:     06/11/2016   Primary Discharge Diagnoses:  1. Acute on chronic hypoxemic respiratory failure 2. Acute systolic CHF 3. AKI 4. PNA 5. Afib 6. CAD s/p CABG 7. Ischemic cardiomyopathy  Hospital Course:   Joshua [name] is a 75 y.o. male with PMH of chronic diastolic CHF, COPD on home 02, COPD, history of asbestos exposure, HTN, HLD, and DM.   Pt presented with worsening SOB x 2 weeks despite increase in lasix. Had increased oxygen demand. Started on Z pack PTA. He was seen in PCP office and noted to have oxygen desat and tachycardia and was sent to ED.  Given solumedrol and IV lasix.  Moved to stepdown 06/05/16 with worsening respiratory status and required BiPAP. PICC placed for CVP. Thought to be combination of AECOPD and acute HF.   Echo (6/3): EF 25-30%, severe hypokinesis, moderate MR, mildly dilated RV with mildly decreased systolic function, PASP 48 mmHg.   Improved on nebs, steroids, ABX, and IV lasix + metolazone.  CVP followed and HF medications adjusted as tolerated. Completed course of azithromycin.   Creatinine initially bumped up to 1.6 but slowly improved with diuresis.   [facility] 06/10/16 showed mildly elevated left/right filling pressures, preserved cardiac output, and severe 3 vessel native disease with patent bypass grafts. No interventional target.    Placed on prednisone taper and dulera by pulmonology. To finish prednisone taper 06/13/16.  Examined am of 06/11/16 and thought to be stable for discharge. Tolerated transition to torsemide. Overall diuresed 11.4 L and down 22 lbs from admission.  He will be discharged to home in stable condition with close follow up as below.   Discharge Weight Range: 233 lb Discharge Vitals: Blood pressure 117/73, pulse 70, temperature 98.4 F (36.9 C), temperature source Oral, resp.  rate 18, height 5' 11.5" (1.816 m), weight 233 lb 6.4 oz (105.9 kg), SpO2 94 %.  Labs: Lab Results  Component Value Date   WBC 11.9 (H) 06/11/2016   HGB 13.6 06/11/2016   HCT 45.5 06/11/2016   MCV 84.7 06/11/2016   PLT 253 06/11/2016     Recent Labs Lab 06/11/16 0431  NA 138  K 3.4*  CL 85*  CO2 38*  BUN 57*  CREATININE 1.24  CALCIUM 8.9  GLUCOSE 98   Lab Results  Component Value Date   CHOL 119 02/19/2016   HDL 43.30 02/19/2016   LDLCALC 44 02/19/2016   TRIG 159.0 (H) 02/19/2016   BNP (last 3 results)  Recent Labs  10/23/15 1831 06/04/16 1155  BNP 47.7 462.5*    ProBNP (last 3 results) No results for input(s): PROBNP in the last 8760 hours.   Diagnostic Studies/Procedures   [facility] 06/10/16 Coronary Findings   Dominance: Right  Left Main  80% distal left main stenosis.  Left Anterior Descending  Total occlusion proximal LAD after take-off of moderate D1. Patent Y graft to OM and diagonal. The diagonal is small in caliber, patent branch of graft to diagonal. Patent LIMA-LAD.  Left Circumflex  Total occlusion of LCx ostially. Patent Y graft to OM and diagonal. The branch to the OM has 50% stenosis at the touchdown.  Right Coronary Artery  80% mid RCA stenosis. Subtotal occlusion distal RCA. Patent SVG-PDA.  Right Heart   Right Heart Pressures RHC Procedural Findings: Hemodynamics (mmHg) RA mean 10 RV 49/12 PA  45/26, mean 35 PCWP mean 21 LV 127/24 AO 126/63  Oxygen saturations: PA 59% AO 96%  Cardiac Output (Fick) 4.82 Cardiac Index (Fick) 2.11 PVR 2.9 WU  Cardiac Output (Thermo) 5.5 Cardiac Index (Thermo) 2.41       Discharge Medications   Allergies as of 06/11/2016      Reactions   Morphine And Related Other (See Comments)   Drenched with perspiration   Demerol [meperidine] Nausea Only   Starlix [nateglinide] Other (See Comments)   gassy      Medication List    STOP taking these medications   amLODipine 5 MG tablet Commonly  known as:  NORVASC   azithromycin 250 MG tablet Commonly known as:  ZITHROMAX   diltiazem 240 MG 24 hr capsule Commonly known as:  CARDIZEM CD   furosemide 40 MG tablet Commonly known as:  LASIX     TAKE these medications   acetaminophen 325 MG tablet Commonly known as:  TYLENOL Take 2 tablets (650 mg total) by mouth every 4 (four) hours as needed for headache or mild pain.   aspirin 81 MG tablet Take 81 mg by mouth at bedtime. Reported on 02/26/2015   atorvastatin 40 MG tablet Commonly known as:  LIPITOR TAKE ONE TABLET BY MOUTH EVERY DAY   bisoprolol 5 MG tablet Commonly known as:  ZEBETA Take 1 tablet (5 mg total) by mouth daily. Start taking on:  06/12/2016   budesonide-formoterol 160-4.5 MCG/ACT inhaler Commonly known as:  SYMBICORT Inhale 2 puffs into the lungs 2 (two) times daily.   buPROPion 150 MG 12 hr tablet Commonly known as:  WELLBUTRIN SR TAKE ONE TABLET BY MOUTH TWICE DAILY   Chromium Picolinate 1000 MCG Tabs Take 1,000 mcg by mouth daily. Reported on 02/26/2015   citalopram 20 MG tablet Commonly known as:  CELEXA Take 1 tablet (20 mg total) by mouth daily. Start taking on:  06/12/2016 What changed:  medication strength  how much to take   digoxin 0.125 MG tablet Commonly known as:  LANOXIN Take 1 tablet (0.125 mg total) by mouth daily. Start taking on:  06/12/2016   fluticasone 50 MCG/ACT nasal spray Commonly known as:  FLONASE Place 2 sprays into both nostrils daily.   gabapentin 100 MG capsule Commonly known as:  NEURONTIN TAKE 2 CAPSULES BY MOUTH 3 TIMES DAILY What changed:  See the new instructions.   guaiFENesin 600 MG 12 hr tablet Commonly known as:  MUCINEX Take 2 tablets (1,200 mg total) by mouth 2 (two) times daily.   Insulin Glargine 300 UNIT/ML Sopn Commonly known as:  TOUJEO SOLOSTAR Inject 70 Units into the skin daily. What changed:  how much to take   insulin regular 100 units/mL injection Commonly known as:  NOVOLIN  R,HUMULIN R Inject 16 units SQ with BF, 17 units SQ with Lunch, and 18 U SQ with Supper What changed:  additional instructions   isosorbide mononitrate 30 MG 24 hr tablet Commonly known as:  IMDUR TAKE ONE TABLET BY MOUTH DAILY   mometasone-formoterol 100-5 MCG/ACT Aero Commonly known as:  DULERA Inhale 2 puffs into the lungs 2 (two) times daily.   multivitamin with minerals Tabs tablet Take 1 tablet by mouth daily.   nitroGLYCERIN 0.4 MG SL tablet Commonly known as:  NITROSTAT Place 1 tablet (0.4 mg total) under the tongue every 5 (five) minutes as needed for chest pain.   pioglitazone 45 MG tablet Commonly known as:  ACTOS Take 45 mg by mouth daily. Reported on  02/26/2015   potassium chloride SA 20 MEQ tablet Commonly known as:  K-DUR,KLOR-CON Take 1 tablet (20 mEq total) by mouth daily.   predniSONE 5 MG tablet Commonly known as:  DELTASONE Take 2 tablets 06/12/16 (10 mg total), Take 1 tablet (5 mg total) until 06/14/16   PROAIR HFA 108 (90 Base) MCG/ACT inhaler Generic drug:  albuterol INHALE 2 PUFFS INTO THE LUNGS EVERY 4 HOURS AS NEEDED FOR WHEEZING ORSHORTNESS OF BREATH   rivaroxaban 20 MG Tabs tablet Commonly known as:  XARELTO Take 1 tablet (20 mg total) by mouth daily with supper.   spironolactone 25 MG tablet Commonly known as:  ALDACTONE Take 1 tablet (25 mg total) by mouth daily. Start taking on:  06/12/2016   SYNJARDY 05-998 MG Tabs Generic drug:  Empagliflozin-Metformin HCl TAKE ONE TABLET BY MOUTH TWICE DAILY AFTER A MEAL   tamsulosin 0.4 MG Caps capsule Commonly known as:  FLOMAX TAKE 1 CAPSULE BY MOUTH EVERY DAY AFTER SUPPER   torsemide 20 MG tablet Commonly known as:  DEMADEX Take 2 tablets (40 mg total) by mouth daily. Start taking on:  06/12/2016   vitamin C 1000 MG tablet Take 1,000 mg by mouth daily.   Vitamin D-3 1000 units Caps Take 1,000 Units by mouth daily. Reported on 02/26/2015       Disposition   The patient will be discharged  in stable condition to home. Discharge Instructions    (HEART FAILURE PATIENTS) Call MD:  Anytime you have any of the following symptoms: 1) 3 pound weight gain in 24 hours or 5 pounds in 1 week 2) shortness of breath, with or without a dry hacking cough 3) swelling in the hands, feet or stomach 4) if you have to sleep on extra pillows at night in order to breathe.    Complete by:  As directed    AMB Referral to [facility]    Complete by:  As directed    Reason for consult:  Post hospital TOC - restart services   Diagnoses of:  Heart Failure   Expected date of contact:  1-3 days (reserved for hospital discharges)   AMB Referral to [facility]    Complete by:  As directed    Patient for re-start of services, TOC, HF exacerbation with long hospital stay. Please assign to community Care Coordinator.  Natividad Brood, RN BSN [facility] Liaison  [phone] business mobile phone Toll free office [phone]   Reason for consult:  Restart post hospital TOC   Diagnoses of:  Heart Failure   Expected date of contact:  1-3 days (reserved for hospital discharges)   Diet - low sodium heart healthy    Complete by:  As directed    Increase activity slowly    Complete by:  As directed      Follow-up Information    Magdalen Spatz, NP Follow up on 06/24/2016.   Specialty:  Pulmonary Disease Why:  Follow up with lung doctors at 3 pm. Contact information: [address] [place] [address] [phone]        [facility] Follow up on 06/19/2016.   Specialty:  Cardiology Why:  at 1030 am for post hospital follow up. The code for parking is 6002. Leisure centre manager through [employer] off of [address]. Underground parking on your right. Can also park in lower ED lot and enter blue awning.  Contact information: [address] [phone] mc [place]  [place] [phone]       [facility] Follow up.   Specialty:  [place] Why:  They will do your home health care at your home Contact information: [place] [address] [phone]             Duration of Discharge Encounter: Greater than 35 minutes   Signed, Shirley Friar, PA-C 06/11/2016, 2:44 PM

## 2016-06-11 NOTE — Progress Notes (Addendum)
Occupational Therapy Treatment and Discharge Patient Details Name: Joshua [name] MRN: [id number] DOB: 06-06-1940 Today's Date: 06/11/2016    History of present illness 76 yo admitted with acute on chronic respiratory distress. PMHx: COPD, HTN, DM, HLD, obesity, CABG, CAD, CKD, HF   OT comments  This 76 yo male admitted with above presents to acute OT making progress towards goals, he will also have his wife at home with him 24/7 if he needs anything. He is fully aware of how to manage his activities based off how his breathing is. No further OT needs, we will D/C from acute OT.  Follow Up Recommendations  No OT follow up;Supervision/Assistance - 24 hour    Equipment Recommendations  None recommended by OT       Precautions / Restrictions Precautions Precautions: Fall Precaution Comments: watch sats (94-95% on 2.5 liters today) Restrictions Weight Bearing Restrictions: No       Mobility Bed Mobility               General bed mobility comments: pt sitting EOB upon arrival  Transfers Overall transfer level: Needs assistance Equipment used: Rolling walker (2 wheeled) Transfers: Sit to/from Stand Sit to Stand: Min guard         General transfer comment: cues for safe hand placement and min guard for safety    Balance Overall balance assessment: Needs assistance Sitting-balance support: Feet supported;No upper extremity supported Sitting balance-Leahy Scale: Good     Standing balance support: Bilateral upper extremity supported;During functional activity Standing balance-Leahy Scale: Poor Standing balance comment: relies on RW for balance                            ADL either performed or assessed with clinical judgement   ADL                           Toilet Transfer: Ambulation;RW;Comfort height toilet;Grab bars;Min guard             General ADL Comments: Pt educated that it would be good for him to wear his O2 while showering (he  normally does not). We also discussed how hot showers can increase internal body temperature and this could affect his breathing. He reports that he keeps his bathroom door half way open during showering to help with air circulation.  He also reports that he does not try and go too far or do too much at one time due to increased WOB. He has a rollator he uses to sit on when he gets tired if he is up and about. Did educate pt on purse lipped breathing.     Vision Baseline Vision/History: Wears glasses Wears Glasses: At all times Patient Visual Report: No change from baseline            Cognition Arousal/Alertness: Awake/alert Behavior During Therapy: WFL for tasks assessed/performed Overall Cognitive Status: Within Functional Limits for tasks assessed                                                     Pertinent Vitals/ Pain       Pain Assessment: No/denies pain         Frequency  Min 2X/week        Progress  Toward Goals  OT Goals(current goals can now be found in the care plan section)  Progress towards OT goals: Progressing toward goals     Plan Discharge plan remains appropriate       AM-PAC PT "6 Clicks" Daily Activity     Outcome Measure   Help from another person eating meals?: None Help from another person taking care of personal grooming?: None Help from another person toileting, which includes using toliet, bedpan, or urinal?: A Little Help from another person bathing (including washing, rinsing, drying)?: A Little Help from another person to put on and taking off regular upper body clothing?: None Help from another person to put on and taking off regular lower body clothing?: A Little 6 Click Score: 21    End of Session Equipment Utilized During Treatment: Oxygen (2.5 liters)  OT Visit Diagnosis: Unsteadiness on feet (R26.81)   Activity Tolerance Patient tolerated treatment well (he did have to rest post ambulating to bathroom)    Patient Left with call bell/phone within reach;with family/visitor present (sitting EOB)   Nurse Communication          Time: 1157-2620 OT Time Calculation (min): 17 min  Charges: OT General Charges $OT Visit: 1 Procedure OT Treatments $Self Care/Home Management : 8-22 mins  [address] [address], OTR/L [phone] 06/11/2016

## 2016-06-11 NOTE — Progress Notes (Signed)
PT Cancellation Note  Patient Details Name: QUENTEN NAWAZ MRN: [id number] DOB: 03-09-1940   Cancelled Treatment:    Reason Eval/Treat Not Completed: Patient declined, no reason specified Pt declined and ambulating in room from bathroom to EOB upon arrival. Pt reported he is tired from frequent visits to restroom the last hour. PT will continue to follow acutely.    [place] April, PTA Pager: [phone]   06/11/2016, 3:47 PM

## 2016-06-11 NOTE — Consult Note (Signed)
   [facility] Inpatient Consult   06/11/2016  Joshua [name] 1940-04-15 [id number]  Patient was assessed for [facility] Management for community services. Admitted with respiratory distress with HF exacerbation.   Patient was previously active with [facility].  Met with patient at bedside regarding being restarted with [facility] services. Patient agreed and state he thinks he may be on 1 or 2 new medications.   Patient has an active Consent form signed and on file a brochure with [facility] information given.  Of note, [facility] Care Management services does not replace or interfere with any services that are arranged by inpatient case management or social work. Patient endorses Dr. Gwinda Maine as his primary care provider.  For additional questions or referrals please contact:   Natividad Brood, RN BSN [facility] Liaison  [phone] business mobile phone Toll free office [phone]

## 2016-06-11 NOTE — Progress Notes (Signed)
Patient ID: Joshua [name], male   DOB: 07-Jun-1940, 76 y.o.   MRN: [id number]    Advanced Heart Failure Rounding Note   Subjective:    Developed respiratory failure 6/1 due to combination AECOPD and acute HF. Started on Bipap and moved to ICU.   Started on torsemide yesterday. Creatinine stable 1.6->1.4->1.37->1.29->1.17->1.24.  Weight down 21 lbs.   He is in NSR today.   Breathing improved overall.  He has not had chest pain.   LHC/RHC (6/6):   Dominance: Right  Left Main  80% distal left main stenosis.  Left Anterior Descending  Total occlusion proximal LAD after take-off of moderate D1. Patent Y graft to OM and diagonal. The diagonal is small in caliber, patent branch of graft to diagonal. Patent LIMA-LAD.  Left Circumflex  Total occlusion of LCx ostially. Patent Y graft to OM and diagonal. The branch to the OM has 50% stenosis at the touchdown.  Right Coronary Artery  80% mid RCA stenosis. Subtotal occlusion distal RCA. Patent SVG-PDA.  Right Heart   Right Heart Pressures RHC Procedural Findings: Hemodynamics (mmHg) RA mean 10 RV 49/12 PA 45/26, mean 35 PCWP mean 21 LV 127/24 AO 126/63  Oxygen saturations: PA 59% AO 96%  Cardiac Output (Fick) 4.82 Cardiac Index (Fick) 2.11 PVR 2.9 WU  Cardiac Output (Thermo) 5.5 Cardiac Index (Thermo) 2.41       Echo (6/3): EF 25-30%, severe hypokinesis, moderate MR, mildly dilated RV with mildly decreased systolic function, PASP 48 mmHg.   Objective:   Weight Range:  Vital Signs:   Temp:  [97.8 F (36.6 C)-98.7 F (37.1 C)] 97.8 F (36.6 C) (06/07 0143) Pulse Rate:  [68-96] 69 (06/07 0507) Resp:  [7-21] 20 (06/07 0143) BP: (111-135)/(55-78) 119/67 (06/07 0143) SpO2:  [0 %-99 %] 95 % (06/07 0507) Weight:  [233 lb 6.4 oz (105.9 kg)-237 lb 14 oz (107.9 kg)] 233 lb 6.4 oz (105.9 kg) (06/07 0143) Last BM Date: 06/08/16  Weight change: Filed Weights   06/10/16 0500 06/10/16 1422 06/11/16 0143  Weight: 234 lb 12.6  oz (106.5 kg) 237 lb 14 oz (107.9 kg) 233 lb 6.4 oz (105.9 kg)    Intake/Output:   Intake/Output Summary (Last 24 hours) at 06/11/16 0740 Last data filed at 06/11/16 0622  Gross per 24 hour  Intake              600 ml  Output             2450 ml  Net            -1850 ml     Physical Exam: General: NAD, 2L [place] HEENT: normal. anicteric Neck: Thick, JVP 7-8 cm.  Cor: PMI nonpalpable. Regular S1S2. No obvious murmurs, no S3/S4.  Lungs: Barrel chested. Decreased breath sounds bilaterally but no wheezing.   Abdomen: obese soft, nontender, nondistended. No hepatosplenomegaly. No bruits or masses. Good bowel sounds. Extremities: no cyanosis, clubbing, rash. No edema. Warm.  Right groin cath site benign.  Neuro: alert & orientedx3, cranial nerves grossly intact. moves all 4 extremities w/o difficulty. Affect pleasant   Telemetry: Personally reviewed, NSR in 80s with LBBB   Labs: Basic Metabolic Panel:  Recent Labs Lab 06/04/16 1629  06/07/16 0354 06/08/16 0350 06/09/16 0337 06/10/16 0415 06/11/16 0431  NA  --   < > 137 139 132* 138 138  K  --   < > 3.1* 3.3* 3.9 3.5 3.4*  CL  --   < > 97* 95* 87*  90* 85*  CO2  --   < > 30 33* 37* 36* 38*  GLUCOSE  --   < > 254* 215* 255* 208* 98  BUN  --   < > 68* 64* 57* 60* 57*  CREATININE  --   < > 1.42* 1.37* 1.29* 1.17 1.24  CALCIUM  --   < > 8.2* 8.3* 8.3* 8.8* 8.9  MG 2.1  --   --   --   --   --   --   PHOS 4.5  --   --   --   --   --   --   < > = values in this interval not displayed.  Liver Function Tests:  Recent Labs Lab 06/04/16 1212  AST 25  ALT 18  ALKPHOS 81  BILITOT 1.0  PROT 6.3*  ALBUMIN 3.4*   No results for input(s): LIPASE, AMYLASE in the last 168 hours. No results for input(s): AMMONIA in the last 168 hours.  CBC:  Recent Labs Lab 06/04/16 1212  06/07/16 0354 06/08/16 0350 06/09/16 0337 06/10/16 0415 06/11/16 0431  WBC 9.6  < > 12.5* 8.7 10.9* 11.0* 11.9*  NEUTROABS 7.5  --  10.1*  --   --   --    --   HGB 13.1  < > 11.8* 11.6* 11.9* 12.5* 13.6  HCT 42.6  < > 39.6 38.6* 39.8 41.8 45.5  MCV 83.9  < > 83.9 84.1 84.1 84.3 84.7  PLT PLATELET CLUMPS NOTED ON SMEAR, COUNT APPEARS ADEQUATE  < > 199 174 208 217 253  < > = values in this interval not displayed.  Cardiac Enzymes:  Recent Labs Lab 06/04/16 1629 06/04/16 2103 06/05/16 0234  TROPONINI 0.05* 0.04* 0.04*    BNP: BNP (last 3 results)  Recent Labs  10/23/15 1831 06/04/16 1155  BNP 47.7 462.5*    ProBNP (last 3 results) No results for input(s): PROBNP in the last 8760 hours.    Other results:  Imaging: No results found.   Medications:     Scheduled Medications: . atorvastatin  40 mg Oral Daily  . bisoprolol  5 mg Oral Daily  . buPROPion  150 mg Oral BID  . Chlorhexidine Gluconate Cloth  6 each Topical Daily  . citalopram  20 mg Oral Daily  . digoxin  0.125 mg Oral Daily  . docusate sodium  100 mg Oral BID  . guaiFENesin  1,200 mg Oral BID  . insulin aspart  0-20 Units Subcutaneous TID WC  . insulin aspart  0-5 Units Subcutaneous QHS  . insulin glargine  80 Units Subcutaneous QHS  . isosorbide mononitrate  30 mg Oral Daily  . mouth rinse  15 mL Mouth Rinse BID  . mometasone-formoterol  2 puff Inhalation BID  . polyethylene glycol  17 g Oral Daily  . potassium chloride  40 mEq Oral BID  . predniSONE  15 mg Oral Q breakfast  . rivaroxaban  20 mg Oral Daily  . sodium chloride flush  10-40 mL Intracatheter Q12H  . sodium chloride flush  3 mL Intravenous Q12H  . spironolactone  25 mg Oral Daily  . tamsulosin  0.4 mg Oral QPC supper  . torsemide  40 mg Oral Daily    Infusions: . sodium chloride Stopped (06/06/16 1100)  . sodium chloride      PRN Medications: sodium chloride, sodium chloride, acetaminophen, albuterol, ipratropium-albuterol, ondansetron **OR** ondansetron (ZOFRAN) IV, sodium chloride flush, sodium chloride flush   Assessment:  1. Acute on chronic hypoxemic respiratory  failure: Patient has COPD, wears 2L O2 by nasal cannula normally.  CT chest 5/29 read as PNA but may have been more CHF, and patient we thought to have COPD exacerbation with wheezing.  However, he was also volume overloaded and in atrial fibrillation.  Suspect respiratory failure is multifactorial (AECOPD, acute systolic CHF). He is doing better with combination of diuresis, steroids, and bronchodilators.  - Continue bronchodilators.  - Now off Solumedrol and on prednisone, tapering down.  - He has completed azithromycin.  2. Acute systolic CHF:  Ischemic cardiomyopathy.  He has been volume overloaded on exam, now diuresing.  CXR has shown pulmonary edema.  Echo 6/3 showed fall in EF to 25-30%.  He has diuresed well, weight down 21 lbs and looks near-euvolemic.  RHC yesterday with adequate cardiac output.  - Continue digoxin 0.125 daily, will need to follow levels.  - Torsemide 40 mg daily.  - Continue current bisoprolol and spironolactone.  - Eventually would like to get him on ARB but will hold off with recent rise in creatinine => aim for ARB at followup if creatinine remains stable.  - With LBBB he may be an eventual CRT candidate.  3. AKI: BUN/creatinine have trended down.     4. PNA: Questionable on chest CT.  Cultures negative, afebrile.  He has completed azithromycin. 5. Atrial fibrillation: History of PAF.  He was in atrial fibrillation initially but now back in NSR.   - Given baseline COPD would avoid amiodarone use.  - Continue Xarelto 20 mg daily.   - With fall in EF, stopped diltiazem CD and now using bisoprolol (more beta-1 selective given COPD).  6. CAD: s/p CABG.  Very mild troponin elevation with no trend, suspect demand ischemia.  However, he has a new LBBB and a fall in EF from prior.  Coronary angiography showed patent bypass grafts, no interventional target.  - No ASA given Xarelto use. Continue statin.  7. Disposition: I think he can go home today.  He will need home health  and PT.  He will need followup with pulmonary and with me in CHF clinic in 10-14 days.  Meds for home: Inhalers and prednisone taper per pulmonary, digoxin 0.125 daily, torsemide 40 daily, KCl 20 daily, spironolactone 25 daily, bisoprolol 5 daily, Xarelto 20 daily, atorvastatin 40 daily, Imdur 30 daily, noncardiac meds as prior to admission.   Length of Stay: 7  Loralie Champagne MD 06/11/2016, 7:40 AM  Advanced Heart Failure Team Pager [phone] (M-F; 7a - 4p)  Please contact [facility] for night-coverage after hours (4p -7a ) and weekends on [email or web address]

## 2016-06-12 ENCOUNTER — Telehealth: Payer: Self-pay | Admitting: *Deleted

## 2016-06-12 ENCOUNTER — Other Ambulatory Visit: Payer: Self-pay

## 2016-06-12 NOTE — Telephone Encounter (Signed)
Keesa (sp?) with [facility] on 06/12/16 at 2:05pm stating that PT will be seeing pt on Tuesday June 16, 2016. She requested a call back.   Tried to call Keesa (sp?) back, was on hold for 5 minutes, had to end call to complete other work.

## 2016-06-12 NOTE — Patient Outreach (Signed)
[facility]) Care Management  06/12/2016  Joshua [name] 1940/07/23 [id number]  Subjective: none  Objective: none  Assessment: Client hospitalized 5/31-6/7 with respiratory failure, heart failure. History of chronic diastolic CHF, COPD on home 02, COPD, history of asbestos exposure, HTN, HLD, and DM.   RNCM called to complete transition of care call. Per person answering the phone, client was asleep. RNCM left HIPPA compliant message and provided contact number.  Plan: await return call. Follow up next business day if no return call.  Thea Silversmith, RN, MSN, [place] Coordinator Cell: [phone]

## 2016-06-15 ENCOUNTER — Encounter (HOSPITAL_COMMUNITY): Payer: Self-pay | Admitting: Emergency Medicine

## 2016-06-15 ENCOUNTER — Other Ambulatory Visit: Payer: Self-pay

## 2016-06-15 ENCOUNTER — Observation Stay (HOSPITAL_COMMUNITY)
Admission: EM | Admit: 2016-06-15 | Discharge: 2016-06-17 | Disposition: A | Discharge status: Skilled Nursing Facility | Payer: Medicare Other | Attending: Internal Medicine | Admitting: Internal Medicine

## 2016-06-15 ENCOUNTER — Emergency Department (HOSPITAL_COMMUNITY): Payer: Medicare Other

## 2016-06-15 ENCOUNTER — Telehealth (HOSPITAL_COMMUNITY): Payer: Self-pay | Admitting: *Deleted

## 2016-06-15 DIAGNOSIS — J449 Chronic obstructive pulmonary disease, unspecified: Secondary | ICD-10-CM | POA: Diagnosis present

## 2016-06-15 DIAGNOSIS — Z87891 Personal history of nicotine dependence: Secondary | ICD-10-CM | POA: Insufficient documentation

## 2016-06-15 DIAGNOSIS — M5134 Other intervertebral disc degeneration, thoracic region: Secondary | ICD-10-CM | POA: Diagnosis not present

## 2016-06-15 DIAGNOSIS — Z9889 Other specified postprocedural states: Secondary | ICD-10-CM | POA: Insufficient documentation

## 2016-06-15 DIAGNOSIS — Z794 Long term (current) use of insulin: Secondary | ICD-10-CM | POA: Diagnosis not present

## 2016-06-15 DIAGNOSIS — W19XXXA Unspecified fall, initial encounter: Secondary | ICD-10-CM | POA: Diagnosis not present

## 2016-06-15 DIAGNOSIS — Z77090 Contact with and (suspected) exposure to asbestos: Secondary | ICD-10-CM | POA: Insufficient documentation

## 2016-06-15 DIAGNOSIS — E118 Type 2 diabetes mellitus with unspecified complications: Secondary | ICD-10-CM | POA: Diagnosis present

## 2016-06-15 DIAGNOSIS — N182 Chronic kidney disease, stage 2 (mild): Secondary | ICD-10-CM | POA: Insufficient documentation

## 2016-06-15 DIAGNOSIS — Z951 Presence of aortocoronary bypass graft: Secondary | ICD-10-CM

## 2016-06-15 DIAGNOSIS — H3530 Unspecified macular degeneration: Secondary | ICD-10-CM | POA: Insufficient documentation

## 2016-06-15 DIAGNOSIS — R531 Weakness: Secondary | ICD-10-CM | POA: Diagnosis not present

## 2016-06-15 DIAGNOSIS — N179 Acute kidney failure, unspecified: Secondary | ICD-10-CM | POA: Diagnosis present

## 2016-06-15 DIAGNOSIS — J9610 Chronic respiratory failure, unspecified whether with hypoxia or hypercapnia: Secondary | ICD-10-CM | POA: Diagnosis not present

## 2016-06-15 DIAGNOSIS — E860 Dehydration: Secondary | ICD-10-CM | POA: Insufficient documentation

## 2016-06-15 DIAGNOSIS — I130 Hypertensive heart and chronic kidney disease with heart failure and stage 1 through stage 4 chronic kidney disease, or unspecified chronic kidney disease: Secondary | ICD-10-CM | POA: Diagnosis not present

## 2016-06-15 DIAGNOSIS — E785 Hyperlipidemia, unspecified: Secondary | ICD-10-CM | POA: Diagnosis not present

## 2016-06-15 DIAGNOSIS — Z7901 Long term (current) use of anticoagulants: Secondary | ICD-10-CM | POA: Insufficient documentation

## 2016-06-15 DIAGNOSIS — Z8249 Family history of ischemic heart disease and other diseases of the circulatory system: Secondary | ICD-10-CM | POA: Insufficient documentation

## 2016-06-15 DIAGNOSIS — E041 Nontoxic single thyroid nodule: Secondary | ICD-10-CM | POA: Insufficient documentation

## 2016-06-15 DIAGNOSIS — Z961 Presence of intraocular lens: Secondary | ICD-10-CM | POA: Insufficient documentation

## 2016-06-15 DIAGNOSIS — S0990XA Unspecified injury of head, initial encounter: Secondary | ICD-10-CM | POA: Diagnosis not present

## 2016-06-15 DIAGNOSIS — E1122 Type 2 diabetes mellitus with diabetic chronic kidney disease: Secondary | ICD-10-CM | POA: Diagnosis not present

## 2016-06-15 DIAGNOSIS — E1121 Type 2 diabetes mellitus with diabetic nephropathy: Secondary | ICD-10-CM | POA: Diagnosis not present

## 2016-06-15 DIAGNOSIS — M5136 Other intervertebral disc degeneration, lumbar region: Secondary | ICD-10-CM | POA: Diagnosis not present

## 2016-06-15 DIAGNOSIS — J441 Chronic obstructive pulmonary disease with (acute) exacerbation: Secondary | ICD-10-CM

## 2016-06-15 DIAGNOSIS — Z96641 Presence of right artificial hip joint: Secondary | ICD-10-CM | POA: Insufficient documentation

## 2016-06-15 DIAGNOSIS — F329 Major depressive disorder, single episode, unspecified: Secondary | ICD-10-CM | POA: Insufficient documentation

## 2016-06-15 DIAGNOSIS — I5042 Chronic combined systolic (congestive) and diastolic (congestive) heart failure: Secondary | ICD-10-CM | POA: Diagnosis not present

## 2016-06-15 DIAGNOSIS — I447 Left bundle-branch block, unspecified: Secondary | ICD-10-CM | POA: Insufficient documentation

## 2016-06-15 DIAGNOSIS — I5022 Chronic systolic (congestive) heart failure: Secondary | ICD-10-CM | POA: Diagnosis present

## 2016-06-15 DIAGNOSIS — I2510 Atherosclerotic heart disease of native coronary artery without angina pectoris: Secondary | ICD-10-CM | POA: Insufficient documentation

## 2016-06-15 DIAGNOSIS — Z7982 Long term (current) use of aspirin: Secondary | ICD-10-CM | POA: Insufficient documentation

## 2016-06-15 DIAGNOSIS — S8991XA Unspecified injury of right lower leg, initial encounter: Secondary | ICD-10-CM | POA: Diagnosis not present

## 2016-06-15 DIAGNOSIS — I700 Atherosclerosis of aorta: Secondary | ICD-10-CM | POA: Diagnosis not present

## 2016-06-15 DIAGNOSIS — Z6830 Body mass index (BMI) 30.0-30.9, adult: Secondary | ICD-10-CM | POA: Insufficient documentation

## 2016-06-15 DIAGNOSIS — Z9841 Cataract extraction status, right eye: Secondary | ICD-10-CM | POA: Insufficient documentation

## 2016-06-15 DIAGNOSIS — Z9049 Acquired absence of other specified parts of digestive tract: Secondary | ICD-10-CM | POA: Insufficient documentation

## 2016-06-15 DIAGNOSIS — Z471 Aftercare following joint replacement surgery: Secondary | ICD-10-CM | POA: Diagnosis not present

## 2016-06-15 DIAGNOSIS — Z823 Family history of stroke: Secondary | ICD-10-CM | POA: Insufficient documentation

## 2016-06-15 DIAGNOSIS — I480 Paroxysmal atrial fibrillation: Secondary | ICD-10-CM | POA: Diagnosis not present

## 2016-06-15 DIAGNOSIS — R0602 Shortness of breath: Secondary | ICD-10-CM

## 2016-06-15 DIAGNOSIS — Z803 Family history of malignant neoplasm of breast: Secondary | ICD-10-CM | POA: Insufficient documentation

## 2016-06-15 DIAGNOSIS — Z9842 Cataract extraction status, left eye: Secondary | ICD-10-CM | POA: Insufficient documentation

## 2016-06-15 DIAGNOSIS — R296 Repeated falls: Secondary | ICD-10-CM

## 2016-06-15 DIAGNOSIS — M79661 Pain in right lower leg: Secondary | ICD-10-CM | POA: Diagnosis not present

## 2016-06-15 DIAGNOSIS — I10 Essential (primary) hypertension: Secondary | ICD-10-CM | POA: Diagnosis present

## 2016-06-15 DIAGNOSIS — Z7951 Long term (current) use of inhaled steroids: Secondary | ICD-10-CM | POA: Insufficient documentation

## 2016-06-15 DIAGNOSIS — Z79899 Other long term (current) drug therapy: Secondary | ICD-10-CM | POA: Insufficient documentation

## 2016-06-15 DIAGNOSIS — Z833 Family history of diabetes mellitus: Secondary | ICD-10-CM | POA: Insufficient documentation

## 2016-06-15 DIAGNOSIS — E669 Obesity, unspecified: Secondary | ICD-10-CM | POA: Insufficient documentation

## 2016-06-15 LAB — URINALYSIS, ROUTINE W REFLEX MICROSCOPIC
Bacteria, UA: NONE SEEN
Bilirubin Urine: NEGATIVE
Glucose, UA: >=500 mg/dL — AB
HGB URINE DIPSTICK: NEGATIVE
Ketones, ur: NEGATIVE mg/dL
Leukocytes, UA: NEGATIVE
NITRITE: NEGATIVE
PH: 5 (ref 5.0–8.0)
Protein, ur: NEGATIVE mg/dL
SPECIFIC GRAVITY, URINE: 1.01 (ref 1.005–1.030)

## 2016-06-15 LAB — COMPREHENSIVE METABOLIC PANEL
ALT: 32 U/L (ref 17–63)
ANION GAP: 17 — AB (ref 5–15)
AST: 27 U/L (ref 15–41)
Albumin: 3.3 g/dL — ABNORMAL LOW (ref 3.5–5.0)
Alkaline Phosphatase: 82 U/L (ref 38–126)
BUN: 62 mg/dL — ABNORMAL HIGH (ref 6–20)
CHLORIDE: 93 mmol/L — AB (ref 101–111)
CO2: 33 mmol/L — AB (ref 22–32)
Calcium: 9.6 mg/dL (ref 8.9–10.3)
Creatinine, Ser: 1.54 mg/dL — ABNORMAL HIGH (ref 0.61–1.24)
GFR, EST AFRICAN AMERICAN: 48 mL/min — AB (ref >60)
GFR, EST NON AFRICAN AMERICAN: 42 mL/min — AB (ref >60)
Glucose, Bld: 155 mg/dL — ABNORMAL HIGH (ref 65–99)
POTASSIUM: 3.8 mmol/L (ref 3.5–5.1)
SODIUM: 143 mmol/L (ref 135–145)
Total Bilirubin: 0.9 mg/dL (ref 0.3–1.2)
Total Protein: 6.3 g/dL — ABNORMAL LOW (ref 6.5–8.1)

## 2016-06-15 LAB — CBC WITH DIFFERENTIAL/PLATELET
Basophils Absolute: 0 10*3/uL (ref 0.0–0.1)
Basophils Relative: 0 %
EOS ABS: 0.2 10*3/uL (ref 0.0–0.7)
EOS PCT: 1 %
HCT: 46 % (ref 39.0–52.0)
Hemoglobin: 14.2 g/dL (ref 13.0–17.0)
LYMPHS ABS: 1.8 10*3/uL (ref 0.7–4.0)
LYMPHS PCT: 13 %
MCH: 26 pg (ref 26.0–34.0)
MCHC: 30.9 g/dL (ref 30.0–36.0)
MCV: 84.2 fL (ref 78.0–100.0)
MONO ABS: 1.9 10*3/uL — AB (ref 0.1–1.0)
Monocytes Relative: 14 %
Neutro Abs: 10.1 10*3/uL — ABNORMAL HIGH (ref 1.7–7.7)
Neutrophils Relative %: 72 %
Platelets: 225 10*3/uL (ref 150–400)
RBC: 5.46 MIL/uL (ref 4.22–5.81)
RDW: 16.6 % — AB (ref 11.5–15.5)
WBC: 14 10*3/uL — ABNORMAL HIGH (ref 4.0–10.5)

## 2016-06-15 LAB — CBG MONITORING, ED: GLUCOSE-CAPILLARY: 129 mg/dL — AB (ref 65–99)

## 2016-06-15 LAB — MAGNESIUM: MAGNESIUM: 2.3 mg/dL (ref 1.7–2.4)

## 2016-06-15 MED ORDER — SODIUM CHLORIDE 0.9 % IV BOLUS (SEPSIS)
250.0000 mL | Freq: Once | INTRAVENOUS | Status: AC
  Administered 2016-06-15: 250 mL via INTRAVENOUS

## 2016-06-15 NOTE — ED Provider Notes (Signed)
[place] DEPT Provider Note   CSN: [id number] Arrival date & time: 06/15/16  1525     History   Chief Complaint Chief Complaint  Patient presents with  . Weakness    HPI Joshua [name] is a 76 y.o. male.  This a 76 year old male who was discharged from the hospital on Friday.  States that he was to ambulate at time of discharge.  When he arrived home was tired, so he went to bed Saturday, when he woke up he was able to ambulate around his house.  He did fall on Saturday night, landing on his right side.  Since that time he's had pain in his right hip and right thigh and weakness to his left leg where he states he is unable to support his own weight. Family reports that he's had several falls between Saturday night and Sunday evening on 2 occasions.  He did not remember the fall.  He is unsure what causes him to fall but states he feels extremely weak.  He denies any fever, nausea, vomiting or diarrhea.  No shortness of breath or chest pain      Past Medical History:  Diagnosis Date  . Acute respiratory distress 06/04/2016  . Adenomatous colon polyp 10/16/2011   Repeat 2018  . Arthritis     Hips, R>L & KNEES  . CAD, multiple vessel    3V CAD cath 08/08/13----CABG done shortly after.  . 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 pylori neg, no dysplasia or malignancy.  . Chronic diastolic heart failure ([place]) 2016  . Chronic renal insufficiency, stage II (mild)   . COPD (chronic obstructive pulmonary disease) ([place])    GOLD II.  Spirometry  2004 borderline obstruction; 2015 mod obst: noncompliant with bid symbicort so pulmonologist switched him to a once daily inhaler: Trelegy ellipta 12/2015.  . Diabetic nephropathy ([place])    Elevated urine microalb/cr 03/2011  . DM type 2 (diabetes mellitus, type 2) ([place])    Poor control on max oral meds--pt eventually agreed to insulin therapy.  As of 2017 his DM is being managed by Dr. Dwyane Dee in endo.  .  DOE (dyspnea on exertion)    COPD + chronic diastolic HF  . Erectile dysfunction    Normal testosterone  . Hearing loss of both ears 2016   Hearing aids  . Hyperlipidemia   . Hyperplastic colon polyp 2001  . Hypertension   . Iron deficiency anemia 2014   03/2012 capsule endoscopy showed 2 AVMs--likely responsible for his IDA--lifetime iron supp recommended + q7moCBCs.  . Macular degeneration, dry    Mild, bilat (Optometrist, [place]ford Knife[place]  . Obesity   . Open toe wound 11/2015   Wound care clinic appt made and then canceled when toe improved.  . Other and unspecified angina pectoris   . PAD (peripheral artery disease) ([place] 02/2016   Abnormal ABI's and waveforms: LE arterial duplex ordered for f/u as per cardiologist's recommendation.  Vasc eval by Dr. CBridgett Larsson impression was minimal PAD, recommended maximize med mgmt.  .Marland KitchenPAF (paroxysmal atrial fibrillation) ([place] 10/2014   xarelto  . Pericardial effusion with cardiac tamponade 6//29/15   pericardiocentesis was done,  Infectious/inflamm (cytology showed NO MALIGNANT CELLS)  . Pleural plaque    asbestos exposure x 2 yrs: Dr. MVaughan Brownerordered CT chest for further eval on 02/2016  . Pneumonia   . Tobacco dependence in remission    100+  pack-yr hx: quit after CABG    Patient Active Problem List   Diagnosis Date Noted  . CHF (congestive heart failure) ([place])   . AKI (acute kidney injury) ([place])   . Respiratory distress 06/04/2016  . Acute respiratory distress 06/04/2016  . Bronchopneumonia 06/04/2016  . Tachycardia 06/04/2016  . Hypoxia   . Chronic diastolic CHF (congestive heart failure) ([place]) 02/18/2015  . Acute upper respiratory infection 02/06/2015  . Facial cellulitis 11/28/2014  . Abscess of nasal cavity 11/28/2014  . Cellulitis and abscess of neck 11/28/2014  . Acute on chronic diastolic CHF (congestive heart failure) ([place]) 11/12/2014  . Dyspnea 10/29/2014  . Cough 10/29/2014  .  History of asbestosis 10/29/2014  . Low oxygen saturation 10/29/2014  . Respiratory failure with hypoxia ([place]) 10/29/2014  . COPD exacerbation ([place]) 10/29/2014  . Acute on chronic respiratory failure with hypoxia ([place])   . Diabetes ([place]) 06/18/2014  . Depression 06/11/2014  . Diabetes mellitus with complication ([place]) 87/68/1157  . Postoperative anemia due to acute blood loss 10/12/2013  . 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 Coronary artery disease- 3 V at cath 08/08/13 08/08/2013  . DOE (dyspnea on exertion)   . Acute on chronic respiratory failure ([place]) 07/02/2013  . PAF (paroxysmal atrial fibrillation) ([place]) 07/02/2013  . Acute bronchitis 07/01/2013  . Pleural effusion with elevated pro BNP 07/01/2013  . Pericardial effusion 07/01/2013  . Pericardial effusion with cardiac tamponade- s/p perciocentesis 06/30/13   . ACS (acute coronary syndrome) ([place]) 06/30/2013  . Fatigue 06/30/2013  . Nonspecific abnormal electrocardiogram (ECG) (EKG) 06/30/2013  . COPD with exacerbation ([place]) 06/30/2013  . Abnormal chest x-ray 10/03/2012  . 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 Arthritis   . COPD (chronic obstructive pulmonary disease) ([place])   . CKD (chronic kidney disease), stage I   . Obesity   . Degenerative arthritis of hip 08/12/2012  . Preoperative clearance 07/07/2012  . Hyperlipidemia 03/16/2012  . Tobacco dependence   . Health maintenance examination 09/16/2011  . HTN (hypertension), benign 09/02/2011  . Prostate cancer screening 09/02/2011    Past Surgical History:  Procedure Laterality Date  . APPENDECTOMY  1957  . CARDIAC CATHETERIZATION  08/08/2013  . CATARACT EXTRACTION W/ INTRAOCULAR LENS  IMPLANT, BILATERAL  04/08/2006 & 04/22/2006  . CATARACT EXTRACTION W/ INTRAOCULAR LENS  IMPLANT, BILATERAL Bilateral   . CHOLECYSTECTOMY OPEN  1999  . COLONOSCOPY  10/16/2011   Procedure: COLONOSCOPY;  Surgeon: Inda Castle, MD;  Location: [facility] ENDOSCOPY;  Service: Endoscopy;  Laterality: N/A;  .  CORONARY ARTERY BYPASS GRAFT N/A 08/14/2013   Procedure: CORONARY ARTERY BYPASS GRAFTING (CABG);  Surgeon: Melrose Nakayama, MD;  Location: [place];  Service: Open Heart Surgery;  Laterality: N/A;  Times 4   using left internal mammary artery and endoscopically harvested bilateral saphenous vein  . ESOPHAGOGASTRODUODENOSCOPY  02/25/12   Atrophic gastritis with a few erosions--capsule endoscopy planned as of 02/25/12 (Dr. Deatra Ina).  [name] HARDWARE REMOVAL Right 08/12/2012   Procedure: HARDWARE REMOVAL;  Surgeon: Mcarthur Rossetti, MD;  Location: [facility] ORS;  Service: Orthopedics;  Laterality: Right;  . HIP SURGERY Right 1954   Repair of slipped capital femoral epiphysis.  . INTRAOPERATIVE TRANSESOPHAGEAL ECHOCARDIOGRAM N/A 08/14/2013   Normal LV function. Procedure: INTRAOPERATIVE TRANSESOPHAGEAL ECHOCARDIOGRAM;  Surgeon: Melrose Nakayama, MD;  Location: [place];  Service: Open Heart Surgery;  Laterality: N/A;  . LEFT HEART CATHETERIZATION WITH CORONARY ANGIOGRAM N/A 08/08/2013   Procedure: LEFT HEART CATHETERIZATION WITH CORONARY ANGIOGRAM;  Surgeon:  Jettie Booze, MD;  [facility] CATH LAB;  Service: Cardiovascular;  Laterality: N/A;  . PATELLA FRACTURE SURGERY Left ~ 1979   bolt + 3 screws to repair tib plateau fx  . PERICARDIAL TAP N/A 07/01/2013   Procedure: PERICARDIAL TAP;  Surgeon: Jettie Booze, MD;  Location: [facility] CATH LAB;  Service: Cardiovascular;  Laterality: N/A;  . RIGHT/LEFT HEART CATH AND CORONARY ANGIOGRAPHY N/A 06/10/2016   Procedure: Right/Left Heart Cath and Coronary Angiography;  Surgeon: Larey Dresser, MD;  Location: [place] CV LAB;  Service: Cardiovascular;  Laterality: N/A;  . TESTICLE SURGERY  as a child   Undescended testicle brought down into scrotum  . TONSILLECTOMY  1947  . TOTAL HIP ARTHROPLASTY Right 08/12/2012   Procedure: REMOVAL OF OLD PINS RIGHT HIP AND RIGHT TOTAL HIP ARTHROPLASTY ANTERIOR APPROACH;  Surgeon: Mcarthur Rossetti, MD;  [facility]  ORS;  Service: Orthopedics;  Laterality: Right;  . TRANSTHORACIC ECHOCARDIOGRAM  10/30/14   Mod LVH, EF 60-65%, normal wall motion, mod mitral regurg, mild PAH       Home Medications    Prior to Admission medications   Medication Sig Start Date End Date Taking? Authorizing Provider  acetaminophen (TYLENOL) 325 MG tablet Take 2 tablets (650 mg total) by mouth every 4 (four) hours as needed for headache or mild pain. 08/09/13   Erlene Quan, PA-C  Ascorbic Acid (VITAMIN C) 1000 MG tablet Take 1,000 mg by mouth daily.    [provider]  aspirin 81 MG tablet Take 81 mg by mouth at bedtime. Reported on 02/26/2015    [provider]  atorvastatin (LIPITOR) 40 MG tablet TAKE ONE TABLET BY MOUTH EVERY DAY 02/17/16   McGowen, Adrian Blackwater, MD  bisoprolol (ZEBETA) 5 MG tablet Take 1 tablet (5 mg total) by mouth daily. 06/12/16   Shirley Friar, PA-C  budesonide-formoterol (SYMBICORT) 160-4.5 MCG/ACT inhaler Inhale 2 puffs into the lungs 2 (two) times daily. 05/17/15   McGowen, Adrian Blackwater, MD  buPROPion (WELLBUTRIN SR) 150 MG 12 hr tablet TAKE ONE TABLET BY MOUTH TWICE DAILY 12/17/15   McGowen, Adrian Blackwater, MD  Cholecalciferol (VITAMIN D-3) 1000 units CAPS Take 1,000 Units by mouth daily. Reported on 02/26/2015    [provider]  Chromium Picolinate 1000 MCG TABS Take 1,000 mcg by mouth daily. Reported on 02/26/2015    [provider]  citalopram (CELEXA) 20 MG tablet Take 1 tablet (20 mg total) by mouth daily. 06/12/16   Shirley Friar, PA-C  digoxin (LANOXIN) 0.125 MG tablet Take 1 tablet (0.125 mg total) by mouth daily. 06/12/16   Shirley Friar, PA-C  fluticasone (FLONASE) 50 MCG/ACT nasal spray Place 2 sprays into both nostrils daily. 06/04/16   McGowen, Adrian Blackwater, MD  gabapentin (NEURONTIN) 100 MG capsule TAKE 2 CAPSULES BY MOUTH 3 TIMES DAILY Patient taking differently: take 2 capsules in the morning and 3 capsules at bedtime 12/02/15   McGowen, Adrian Blackwater, MD  guaiFENesin (MUCINEX) 600 MG 12 hr tablet Take 2 tablets (1,200 mg total) by mouth 2 (two) times daily. 11/02/14   Verlee Monte, MD  Insulin Glargine (TOUJEO SOLOSTAR) 300 UNIT/ML SOPN Inject 70 Units into the skin daily. Patient taking differently: Inject 60 Units into the skin daily.  04/20/16   Elayne Snare, MD  insulin regular (NOVOLIN R,HUMULIN R) 100 units/mL injection Inject 16 units SQ with BF, 17 units SQ with Lunch, and 18 U SQ with Supper Patient taking differently: 28 units  in the am and at lunch and 28 at bedtime 09/27/15   McGowen, Adrian Blackwater, MD  isosorbide mononitrate (IMDUR) 30 MG 24 hr tablet TAKE ONE TABLET BY MOUTH DAILY 02/17/16   McGowen, Adrian Blackwater, MD  mometasone-formoterol (DULERA) 100-5 MCG/ACT AERO Inhale 2 puffs into the lungs 2 (two) times daily. 06/11/16   Shirley Friar, PA-C  Multiple Vitamin (MULTIVITAMIN WITH MINERALS) TABS Take 1 tablet by mouth daily.    [provider]  nitroGLYCERIN (NITROSTAT) 0.4 MG SL tablet Place 1 tablet (0.4 mg total) under the tongue every 5 (five) minutes as needed for chest pain. 01/31/16   Jettie Booze, MD  pioglitazone (ACTOS) 45 MG tablet Take 45 mg by mouth daily. Reported on 02/26/2015 06/26/13   [provider]  potassium chloride SA (K-DUR,KLOR-CON) 20 MEQ tablet Take 1 tablet (20 mEq total) by mouth daily. 06/11/16   Shirley Friar, PA-C  predniSONE (DELTASONE) 5 MG tablet Take 2 tablets 06/12/16 (10 mg total), Take 1 tablet (5 mg total) until 06/14/16 06/11/16   Shirley Friar, PA-C  PROAIR HFA 108 (90 Base) MCG/ACT inhaler INHALE 2 PUFFS INTO THE LUNGS EVERY 4 HOURS AS NEEDED FOR WHEEZING ORSHORTNESS OF BREATH 06/05/16   McGowen, Adrian Blackwater, MD  rivaroxaban (XARELTO) 20 MG TABS tablet Take 1 tablet (20 mg total) by mouth daily with supper. 03/19/16   Jettie Booze, MD  spironolactone (ALDACTONE) 25 MG tablet Take 1 tablet (25 mg total) by mouth daily. 06/12/16   Shirley Friar,  PA-C  SYNJARDY 05-998 MG TABS TAKE ONE TABLET BY MOUTH TWICE DAILY AFTER A MEAL 02/17/16   Elayne Snare, MD  tamsulosin (FLOMAX) 0.4 MG CAPS capsule TAKE 1 CAPSULE BY MOUTH EVERY DAY AFTER SUPPER 05/06/16   McGowen, Adrian Blackwater, MD  torsemide (DEMADEX) 20 MG tablet Take 2 tablets (40 mg total) by mouth daily. 06/12/16   Shirley Friar, PA-C    Family History Family History  Problem Relation Age of Onset  . Heart disease Father   . Heart attack Father   . CVA Mother   . Hypertension Mother   . Diabetes Paternal Grandmother   . Breast cancer Sister   . Diabetes Maternal Uncle   . Heart disease Maternal Uncle   . Stroke Neg Hx     Social History Social History  Substance Use Topics  . Smoking status: Former Smoker    Packs/day: 0.25    Years: 62.00    Types: Cigarettes    Quit date: 10/23/2015  . Smokeless tobacco: Former Systems developer    Quit date: 06/30/2013  . Alcohol use No     Allergies   Morphine and related; Demerol [meperidine]; and Starlix [nateglinide]   Review of Systems Review of Systems  Constitutional: Negative for fever.  Respiratory: Negative for shortness of breath.   Cardiovascular: Negative for chest pain.  Musculoskeletal: Positive for arthralgias. Negative for back pain.  Neurological: Positive for weakness.  All other systems reviewed and are negative.    Physical Exam Updated Vital Signs BP (!) 107/55   Pulse 60   Temp 98.2 F (36.8 C)   Resp 16   Ht _0  (1.803 m)   Wt 103.9 kg (229 lb)   SpO2 100%   BMI 31.94 kg/m   Physical Exam  Constitutional: He appears well-developed and well-nourished. No distress.  HENT:  Head: Normocephalic.  Eyes: Pupils are equal, round, and reactive to light.  Neck: Normal range of motion.  Cardiovascular: Normal rate.   Pulmonary/Chest: Effort normal.  Abdominal: Soft.  Musculoskeletal: He exhibits no tenderness or deformity.  Neurological: He is alert.  Skin: Skin is warm and dry. There is pallor.    Psychiatric: He has a normal mood and affect.  Nursing note and vitals reviewed.    ED Treatments / Results  Labs (all labs ordered are listed, but only abnormal results are displayed) Labs Reviewed  CBC WITH DIFFERENTIAL/PLATELET - Abnormal; Notable for the following:       Result Value   WBC 14.0 (*)    RDW 16.6 (*)    Neutro Abs 10.1 (*)    Monocytes Absolute 1.9 (*)    All other components within normal limits  COMPREHENSIVE METABOLIC PANEL - Abnormal; Notable for the following:    Chloride 93 (*)    CO2 33 (*)    Glucose, Bld 155 (*)    BUN 62 (*)    Creatinine, Ser 1.54 (*)    Total Protein 6.3 (*)    Albumin 3.3 (*)    GFR calc non Af Amer 42 (*)    GFR calc Af Amer 48 (*)    Anion gap 17 (*)    All other components within normal limits  CBG MONITORING, ED - Abnormal; Notable for the following:    Glucose-Capillary 129 (*)    All other components within normal limits  MAGNESIUM  URINALYSIS, ROUTINE W REFLEX MICROSCOPIC    EKG  EKG Interpretation None       Radiology Ct Head Wo Contrast  Result Date: 06/15/2016 CLINICAL DATA:  Status post falls, with right leg weakness. Initial encounter. EXAM: CT HEAD WITHOUT CONTRAST TECHNIQUE: Contiguous axial images were obtained from the base of the skull through the vertex without intravenous contrast. COMPARISON:  CT of the head performed 04/04/2015 FINDINGS: Brain: No evidence of acute infarction, hemorrhage, hydrocephalus, extra-axial collection or mass lesion/mass effect. Prominence of the ventricles and sulci reflects mild cortical volume loss. 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 Mild cerebellar atrophy is noted. The brainstem and fourth ventricle are within normal limits. The cerebral hemispheres demonstrate grossly normal gray-white differentiation. No mass effect or midline shift is seen. Vascular: No hyperdense vessel or unexpected calcification. Skull: There is no evidence of fracture;  visualized osseous structures are unremarkable in appearance. Sinuses/Orbits: The orbits are within normal limits. The paranasal sinuses and mastoid air cells are well-aerated. Other: No significant soft tissue abnormalities are seen. IMPRESSION: 1. No evidence of traumatic intracranial injury or fracture. 2. Mild cortical volume loss. 3. Chronic lacunar infarcts at the right basal ganglia. Electronically Signed   By: Garald Balding M.D.   On: 06/15/2016 21:23   Dg Hip Unilat W Or Wo Pelvis 1 View Right  Result Date: 06/15/2016 CLINICAL DATA:  Right hip pain after fall on Saturday. Hip replacement in 2015. EXAM: DG HIP (WITH OR WITHOUT PELVIS) 1V RIGHT COMPARISON:  None. FINDINGS: There is no evidence of acute hip fracture or dislocation. The bony pelvis appears intact lower lumbar degenerative disc and facet arthropathy. No pelvic diastasis. The native left hip joint is maintained. Intact uncemented right hip arthroplasty without asymmetric lining wear. Mild heterotopic bone formation is seen about the right hip consistent with postop change. There is no evidence of arthropathy or other focal bone abnormality. IMPRESSION: Intact right uncemented hip arthroplasty with postop change. No acute displaced fracture is seen. Electronically Signed   By: Ashley Royalty M.D.   On:  06/15/2016 21:47   Dg Femur 1 View Right  Result Date: 06/15/2016 CLINICAL DATA:  Patient fell with right-sided lower extremity pain. EXAM: RIGHT FEMUR 1 VIEW COMPARISON:  None. FINDINGS: Overlapping AP views were provided as requested. This limits assessment for displacement and dislocations in the antero posterior plane. There is an intact uncemented right hip arthroplasty with postop heterotopic new bone formation about the right hip. Common femoral through popliteal calcific atherosclerosis. Vascular clips are noted bilaterally along the medial aspect of the distal thighs. Femorotibial joint space narrowing with chondrocalcinosis is seen  about the knee. The visualized pubic rami appear intact. IMPRESSION: Negative for acute fracture or gross malalignment. Electronically Signed   By: Ashley Royalty M.D.   On: 06/15/2016 21:50    Procedures Procedures (including critical care time)  Medications Ordered in ED Medications  sodium chloride 0.9 % bolus 250 mL (250 mLs Intravenous New Bag/Given 06/15/16 2157)     Initial Impression / Assessment and Plan / ED Course  I have reviewed the triage vital signs and the nursing notes.  Pertinent labs & imaging results that were available during my care of the patient were reviewed by me and considered in my medical decision making (see chart for details).   patient has no reproducible pain on his right side.  Full range of motion of the right leg at the knee and hip, but will x-ray.  This due to previous hip replacement. On neuro exam, patient.  Equal strength bilaterally, but when I attempted to sit patient up, he was unable to assist and a tick 2 people to get him into a sitting position.  He was unable to hold this position for any length of time.  I did not attempt to stand him. His lab shows that he is slightly dehydrated.  He is given a small amount of IV fluid due to his fluid restriction and recent diuresis of 11.7 liters. Social work did speak with the family, they're not opposed to admission to a rehabilitation for decreased strength and deconditioning.     Final Clinical Impressions(s) / ED Diagnoses   Final diagnoses:  Weakness  Dehydration  Fall, initial encounter    New Prescriptions New Prescriptions   No medications on file     Junius Creamer, NP 06/15/16 [place]    Tanna Furry, MD 06/21/16 [phone]

## 2016-06-15 NOTE — Telephone Encounter (Signed)
[facility] called stating patients wife c/o pt fallen three times in the past 24 hours. Patient is confused and had a possible syncopal episode while using the restroom. I called and spoke with patients wife she said patient can barely walk and make it to the bathroom. She stated this is a drastic change from Friday.  Per Jonni Sanger I advised patient to go to the emergency room. Patients wife agreeable and will take him to the ED.

## 2016-06-15 NOTE — Telephone Encounter (Signed)
Darlina Guys calling back to check status of message.  I advised her that Nira Conn attempted to return the call on Friday but was on hold for an extended period of time.  I was unable to reach [facility] via phone this morning to speak with [place].    She is requesting a call back from [place] when she is available.  Please call [phone].

## 2016-06-15 NOTE — ED Provider Notes (Signed)
Patient seen and evaluated. Discussed with Roque Cash NP. Patient discharged on Friday after 3 days of aggressive diuresis. Has had progressive weakness and multiple falls. Cannot stand independently today. Had assistance of 2 family members to simply get from the ground. No apparent injuries other than some bruising of his left forearm. No confusion. No fever. Breathing has been at its baseline. Able to move the right leg but has pain in his right hip or has previous hip arthroplasty.  4 out of 5 strength to bilateral extremity is. 5 out of 5 bilateral upper extremities. No localizing cranial nerve deficits. Initially twice reassuring. Patient is too weak to be at home. May likely require readmission, PT and OT evaluation, probable rehabilitation hospital for discharge   Tanna Furry, MD 06/15/16 2202

## 2016-06-15 NOTE — ED Notes (Signed)
Patient returned from X-ray 

## 2016-06-15 NOTE — Telephone Encounter (Signed)
Great. Noted.

## 2016-06-15 NOTE — Telephone Encounter (Signed)
SW Darlina Guys, she wanted to let Dr. Anitra Lauth know that a RN will be going out tomorrow to see pt for [facility] services.

## 2016-06-15 NOTE — ED Triage Notes (Addendum)
Pt to the ED with c/o weakness in bil legs,.  Family st's pt was admitted to hosp last week for COPD and CHF.  Pt was discharged oin Fri.  Pt st's legs started getting weak on Sat and has not been able to stand since then.  Pt is on home 02 at 2.5 liters

## 2016-06-16 ENCOUNTER — Observation Stay (HOSPITAL_COMMUNITY): Payer: Medicare Other

## 2016-06-16 DIAGNOSIS — R531 Weakness: Secondary | ICD-10-CM | POA: Diagnosis not present

## 2016-06-16 DIAGNOSIS — R0602 Shortness of breath: Secondary | ICD-10-CM | POA: Diagnosis not present

## 2016-06-16 LAB — BASIC METABOLIC PANEL
Anion gap: 10 (ref 5–15)
BUN: 59 mg/dL — AB (ref 6–20)
CO2: 38 mmol/L — ABNORMAL HIGH (ref 22–32)
Calcium: 8.9 mg/dL (ref 8.9–10.3)
Chloride: 91 mmol/L — ABNORMAL LOW (ref 101–111)
Creatinine, Ser: 1.54 mg/dL — ABNORMAL HIGH (ref 0.61–1.24)
GFR calc non Af Amer: 42 mL/min — ABNORMAL LOW (ref >60)
GFR, EST AFRICAN AMERICAN: 48 mL/min — AB (ref >60)
Glucose, Bld: 197 mg/dL — ABNORMAL HIGH (ref 65–99)
POTASSIUM: 4.5 mmol/L (ref 3.5–5.1)
SODIUM: 139 mmol/L (ref 135–145)

## 2016-06-16 LAB — GLUCOSE, CAPILLARY
GLUCOSE-CAPILLARY: 231 mg/dL — AB (ref 65–99)
Glucose-Capillary: 190 mg/dL — ABNORMAL HIGH (ref 65–99)
Glucose-Capillary: 144 mg/dL — ABNORMAL HIGH (ref 65–99)
Glucose-Capillary: 263 mg/dL — ABNORMAL HIGH (ref 65–99)
Glucose-Capillary: 220 mg/dL — ABNORMAL HIGH (ref 65–99)
Glucose-Capillary: 238 mg/dL — ABNORMAL HIGH (ref 65–99)

## 2016-06-16 LAB — CBC
HCT: 45 % (ref 39.0–52.0)
Hemoglobin: 13.2 g/dL (ref 13.0–17.0)
MCH: 25.2 pg — AB (ref 26.0–34.0)
MCHC: 29.3 g/dL — ABNORMAL LOW (ref 30.0–36.0)
MCV: 85.9 fL (ref 78.0–100.0)
PLATELETS: 183 10*3/uL (ref 150–400)
RBC: 5.24 MIL/uL (ref 4.22–5.81)
RDW: 16.8 % — ABNORMAL HIGH (ref 11.5–15.5)
WBC: 11.5 10*3/uL — ABNORMAL HIGH (ref 4.0–10.5)

## 2016-06-16 LAB — CK: CK TOTAL: 47 U/L — AB (ref 49–397)

## 2016-06-16 LAB — HEMATOCRIT: HEMATOCRIT: 45.6 % (ref 39.0–52.0)

## 2016-06-16 LAB — HEMOGLOBIN: HEMOGLOBIN: 13.7 g/dL (ref 13.0–17.0)

## 2016-06-16 LAB — DIGOXIN LEVEL: DIGOXIN LVL: 0.4 ng/mL — AB (ref 0.8–2.0)

## 2016-06-16 MED ORDER — ONDANSETRON HCL 4 MG/2ML IJ SOLN: 4.0000 mg | Freq: Four times a day (QID) | INTRAMUSCULAR | Status: DC | PRN

## 2016-06-16 MED ORDER — ACETAMINOPHEN 650 MG RE SUPP: 650.0000 mg | Freq: Four times a day (QID) | RECTAL | Status: DC | PRN

## 2016-06-16 MED ORDER — POTASSIUM CHLORIDE CRYS ER 20 MEQ PO TBCR
20.0000 meq | EXTENDED_RELEASE_TABLET | Freq: Every day | ORAL | Status: DC
  Administered 2016-06-16 – 2016-06-17 (×2): 20 meq via ORAL
  Filled 2016-06-16 (×2): qty 1

## 2016-06-16 MED ORDER — BISOPROLOL FUMARATE 5 MG PO TABS
5.0000 mg | ORAL_TABLET | Freq: Every day | ORAL | Status: DC
  Administered 2016-06-16 – 2016-06-17 (×2): 5 mg via ORAL
  Filled 2016-06-16 (×2): qty 1

## 2016-06-16 MED ORDER — SODIUM CHLORIDE 0.9% FLUSH
3.0000 mL | Freq: Two times a day (BID) | INTRAVENOUS | Status: DC
  Administered 2016-06-16 (×2): 3 mL via INTRAVENOUS

## 2016-06-16 MED ORDER — TORSEMIDE 20 MG PO TABS
40.0000 mg | ORAL_TABLET | Freq: Every day | ORAL | Status: DC
  Administered 2016-06-17: 40 mg via ORAL
  Filled 2016-06-16: qty 2

## 2016-06-16 MED ORDER — BUPROPION HCL ER (SR) 150 MG PO TB12
150.0000 mg | ORAL_TABLET | Freq: Two times a day (BID) | ORAL | Status: DC
  Administered 2016-06-16 – 2016-06-17 (×3): 150 mg via ORAL
  Filled 2016-06-16 (×3): qty 1

## 2016-06-16 MED ORDER — ACETAMINOPHEN 325 MG PO TABS: 650.0000 mg | ORAL_TABLET | Freq: Four times a day (QID) | ORAL | Status: DC | PRN

## 2016-06-16 MED ORDER — SPIRONOLACTONE 25 MG PO TABS
25.0000 mg | ORAL_TABLET | Freq: Every day | ORAL | Status: DC
  Administered 2016-06-17: 25 mg via ORAL
  Filled 2016-06-16: qty 1

## 2016-06-16 MED ORDER — MOMETASONE FURO-FORMOTEROL FUM 100-5 MCG/ACT IN AERO
2.0000 | INHALATION_SPRAY | Freq: Two times a day (BID) | RESPIRATORY_TRACT | Status: DC
  Administered 2016-06-16 – 2016-06-17 (×3): 2 via RESPIRATORY_TRACT
  Filled 2016-06-16: qty 8.8

## 2016-06-16 MED ORDER — CITALOPRAM HYDROBROMIDE 20 MG PO TABS
20.0000 mg | ORAL_TABLET | Freq: Every day | ORAL | Status: DC
  Administered 2016-06-16 – 2016-06-17 (×2): 20 mg via ORAL
  Filled 2016-06-16 (×2): qty 1

## 2016-06-16 MED ORDER — ASPIRIN EC 81 MG PO TBEC
81.0000 mg | DELAYED_RELEASE_TABLET | Freq: Every day | ORAL | Status: DC
  Administered 2016-06-16: 81 mg via ORAL
  Filled 2016-06-16: qty 1

## 2016-06-16 MED ORDER — TAMSULOSIN HCL 0.4 MG PO CAPS
0.4000 mg | ORAL_CAPSULE | Freq: Every day | ORAL | Status: DC
  Administered 2016-06-16: 0.4 mg via ORAL
  Filled 2016-06-16: qty 1

## 2016-06-16 MED ORDER — GABAPENTIN 100 MG PO CAPS
200.0000 mg | ORAL_CAPSULE | Freq: Two times a day (BID) | ORAL | Status: DC
  Administered 2016-06-16 – 2016-06-17 (×3): 200 mg via ORAL
  Filled 2016-06-16 (×3): qty 2

## 2016-06-16 MED ORDER — IPRATROPIUM-ALBUTEROL 0.5-2.5 (3) MG/3ML IN SOLN: 3.0000 mL | RESPIRATORY_TRACT | Status: DC | PRN

## 2016-06-16 MED ORDER — RIVAROXABAN 20 MG PO TABS
20.0000 mg | ORAL_TABLET | Freq: Every day | ORAL | Status: DC
  Administered 2016-06-16: 20 mg via ORAL
  Filled 2016-06-16: qty 1

## 2016-06-16 MED ORDER — ISOSORBIDE MONONITRATE ER 30 MG PO TB24
30.0000 mg | ORAL_TABLET | Freq: Every day | ORAL | Status: DC
  Administered 2016-06-16 – 2016-06-17 (×2): 30 mg via ORAL
  Filled 2016-06-16 (×2): qty 1

## 2016-06-16 MED ORDER — INSULIN GLARGINE 100 UNIT/ML ~~LOC~~ SOLN
60.0000 [IU] | Freq: Every day | SUBCUTANEOUS | Status: DC
  Administered 2016-06-16 – 2016-06-17 (×2): 60 [IU] via SUBCUTANEOUS
  Filled 2016-06-16 (×2): qty 0.6

## 2016-06-16 MED ORDER — ONDANSETRON HCL 4 MG PO TABS: 4.0000 mg | ORAL_TABLET | Freq: Four times a day (QID) | ORAL | Status: DC | PRN

## 2016-06-16 MED ORDER — INSULIN ASPART 100 UNIT/ML ~~LOC~~ SOLN
0.0000 [IU] | Freq: Every day | SUBCUTANEOUS | Status: DC
  Administered 2016-06-16: 2 [IU] via SUBCUTANEOUS

## 2016-06-16 MED ORDER — ATORVASTATIN CALCIUM 40 MG PO TABS
40.0000 mg | ORAL_TABLET | Freq: Every day | ORAL | Status: DC
  Administered 2016-06-16 – 2016-06-17 (×2): 40 mg via ORAL
  Filled 2016-06-16 (×2): qty 1

## 2016-06-16 MED ORDER — DIGOXIN 125 MCG PO TABS
0.1250 mg | ORAL_TABLET | Freq: Every day | ORAL | Status: DC
  Administered 2016-06-16 – 2016-06-17 (×2): 0.125 mg via ORAL
  Filled 2016-06-16 (×2): qty 1

## 2016-06-16 MED ORDER — INSULIN ASPART 100 UNIT/ML ~~LOC~~ SOLN
0.0000 [IU] | Freq: Three times a day (TID) | SUBCUTANEOUS | Status: DC
  Administered 2016-06-16: 2 [IU] via SUBCUTANEOUS
  Administered 2016-06-16: 5 [IU] via SUBCUTANEOUS
  Administered 2016-06-16 – 2016-06-17 (×2): 8 [IU] via SUBCUTANEOUS
  Administered 2016-06-17: 3 [IU] via SUBCUTANEOUS

## 2016-06-16 NOTE — Evaluation (Signed)
Occupational Therapy Evaluation Patient Details Name: Joshua [name] MRN: [id number] DOB: 04/21/1940 Today's Date: 06/16/2016    History of Present Illness 76 yo male admitted with R groin hematoma, s/p fall with R hip pain. CT neg  Past Medical History:  Diagnosis Date  . Acute respiratory distress 06/04/2016  . Adenomatous colon polyp 10/16/2011   Repeat 2018  . Arthritis     Hips, R>L & KNEES  . CAD, multiple vessel    3V CAD cath 08/08/13----CABG done shortly after.  . 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 pylori neg, no dysplasia or malignancy.  . Chronic diastolic heart failure ([place]) 2016  . Chronic renal insufficiency, stage II (mild)   . COPD (chronic obstructive pulmonary disease) ([place])    GOLD II.  Spirometry  2004 borderline obstruction; 2015 mod obst: noncompliant with bid symbicort so pulmonologist switched him to a once daily inhaler: Trelegy ellipta 12/2015.  . Diabetic nephropathy ([place])    Elevated urine microalb/cr 03/2011  . DM type 2 (diabetes mellitus, type 2) ([place])    Poor control on max oral meds--pt eventually agreed to insulin therapy.  As of 2017 his DM is being managed by Dr. Dwyane Dee in endo.  . DOE (dyspnea on exertion)    COPD + chronic diastolic HF  . Erectile dysfunction    Normal testosterone  . Hearing loss of both ears 2016   Hearing aids  . Hyperlipidemia   . Hyperplastic colon polyp 2001  . Hypertension   . Iron deficiency anemia 2014   03/2012 capsule endoscopy showed 2 AVMs--likely responsible for his IDA--lifetime iron supp recommended + q8moCBCs.  . Macular degeneration, dry    Mild, bilat (Optometrist, DMayford Knifeat [place] [place]  . Obesity   . Open toe wound 11/2015   Wound care clinic appt made and then canceled when toe improved.  . Other and unspecified angina pectoris   . PAD (peripheral artery disease) [place] 02/2016   Abnormal ABI's and waveforms: LE arterial duplex  ordered for f/u as per cardiologist's recommendation.  Vasc eval by Dr. CBridgett Larsson impression was minimal PAD, recommended maximize med mgmt.  .Marland KitchenPAF (paroxysmal atrial fibrillation) ([place] 10/2014   xarelto  . Pericardial effusion with cardiac tamponade 6//29/15   pericardiocentesis was done,  Infectious/inflamm (cytology showed NO MALIGNANT CELLS)  . Pleural plaque    asbestos exposure x 2 yrs: Dr. MVaughan Brownerordered CT chest for further eval on 02/2016  . Pneumonia   . Tobacco dependence in remission    100+ pack-yr hx: quit after CABG     Clinical Impression   PT admitted with general weakness with a fall x1 hour down on floor. Pt currently with functional limitiations due to the deficits listed below (see OT problem list). Pt with progressive weakness and x3 fall in the last week.  Pt will benefit from skilled OT to increase their independence and safety with adls and balance to allow discharge SNF. Pt currently requires min (A) for LB adls, min (A) for basic transfer and fatigues quickly with bed to chair transfer.      Follow Up Recommendations  SNF;Supervision - Intermittent    Equipment Recommendations  None recommended by OT    Recommendations for Other Services       Precautions / Restrictions Precautions Precautions: Fall Precaution Comments: reports 3 falls at night time ; requires O2 baseline      Mobility Bed Mobility Overal bed  mobility: Needs Assistance Bed Mobility: Supine to Sit     Supine to sit: Min assist     General bed mobility comments: incr time and effort to progress to EOB sitting. pt with posterior lean initially and required bed elevation   Transfers Overall transfer level: Needs assistance Equipment used: Rolling walker (2 wheeled) Transfers: Sit to/from Stand Sit to Stand: Min assist         General transfer comment: pt requires rocking momentum to power up     Balance                                           ADL either  performed or assessed with clinical judgement   ADL Overall ADL's : Needs assistance/impaired Eating/Feeding: Independent   Grooming: Wash/dry hands;Oral care;Wash/dry face;Set up;Sitting Grooming Details (indicate cue type and reason): requires seated position due to fatigue with static standing Upper Body Bathing: Supervision/ safety   Lower Body Bathing: Minimal assistance           Toilet Transfer: Minimal assistance Toilet Transfer Details (indicate cue type and reason): requires elevated surface and hand use to power up          Functional mobility during ADLs: Minimal assistance;Rolling walker General ADL Comments: pt with very straight legs without knee flexion during transfers. Pt with uncontrolled descend to chair and bed surface.      Vision Baseline Vision/History: Wears glasses Wears Glasses: At all times       Perception     Praxis      Pertinent Vitals/Pain Pain Assessment: No/denies pain     Hand Dominance Right   Extremity/Trunk Assessment Upper Extremity Assessment Upper Extremity Assessment: Overall WFL for tasks assessed   Lower Extremity Assessment Lower Extremity Assessment: Defer to PT evaluation;Generalized weakness   Cervical / Trunk Assessment Cervical / Trunk Assessment: Normal   Communication Communication Communication: HOH   Cognition Arousal/Alertness: Awake/alert Behavior During Therapy: WFL for tasks assessed/performed Overall Cognitive Status: Within Functional Limits for tasks assessed                                     General Comments       Exercises     Shoulder Instructions      Home Living Family/patient expects to be discharged to:: Private residence Living Arrangements: Spouse/significant other Available Help at Discharge: Family;Available 24 hours/day Type of Home: House Home Access: Ramped entrance     Home Layout: One level     Bathroom Shower/Tub: Radiographer, therapeutic: Handicapped height     Home Equipment: Environmental consultant - 4 wheels;Cane - single point;Shower seat;Grab bars - toilet;Grab bars - tub/shower;Hand held shower head;Adaptive equipment;Bedside commode;Other (comment) Adaptive Equipment: Sock aid;Reacher Additional Comments: lift chair, bed has HOB / Leg elevation functions, normally 2.5 [place] at baseline,       Prior Functioning/Environment Level of Independence: Independent with assistive device(s)        Comments: drives, cane inside, sits to shower, sock aid for L foot, rollator for community        OT Problem List: Decreased strength;Decreased activity tolerance;Impaired balance (sitting and/or standing);Decreased safety awareness;Decreased knowledge of use of DME or AE;Decreased knowledge of precautions;Cardiopulmonary status limiting activity      OT Treatment/Interventions: Self-care/ADL training;Therapeutic exercise;Energy  conservation;DME and/or AE instruction;Therapeutic activities;Patient/family education;Balance training    OT Goals(Current goals can be found in the care plan section) Acute Rehab OT Goals Patient Stated Goal: return home OT Goal Formulation: With patient Time For Goal Achievement: 06/30/16 Potential to Achieve Goals: Good  OT Frequency: Min 2X/week   Barriers to D/C:            Co-evaluation              AM-PAC PT "6 Clicks" Daily Activity     Outcome Measure Help from another person eating meals?: None Help from another person taking care of personal grooming?: A Little Help from another person toileting, which includes using toliet, bedpan, or urinal?: A Little Help from another person bathing (including washing, rinsing, drying)?: A Little Help from another person to put on and taking off regular upper body clothing?: A Little Help from another person to put on and taking off regular lower body clothing?: A Little 6 Click Score: 19   End of Session Equipment Utilized During Treatment: Gait  belt;Rolling walker;Oxygen Nurse Communication: Mobility status;Precautions  Activity Tolerance: Patient tolerated treatment well Patient left: in chair;with call bell/phone within reach;with chair alarm set  OT Visit Diagnosis: Unsteadiness on feet (R26.81)                Time: 9355-2174 OT Time Calculation (min): 17 min Charges:  OT General Charges $OT Visit: 1 Procedure OT Evaluation $OT Eval Moderate Complexity: 1 Procedure G-Codes:      Jeri Modena   OTR/L Pager: [phone] Office: [phone] .   Parke Poisson B 06/16/2016, 3:37 PM

## 2016-06-16 NOTE — Progress Notes (Signed)
Pt. Arrived to unit via stretcher from ED in alert and stable condition. Pt. Oriented to room and placed on telemetry. CCMD notified of pts. Arrival to the floor. Call light placed within reach. Fall risk prevention plan discussed with pt. Pt. Verbalized understanding. Original dressing from previous cath in place to pts. Right groin. Dressing removed and site assessed. Bruising and small hematoma noted to area. Admitting MD, Eulas Post paged and at pts. Bedside to assess area. Pt. Denied any pain or discomfort at site. RN will continue to monitor pt. For changes in condition. Milas Schappell, Katherine Roan

## 2016-06-16 NOTE — Progress Notes (Signed)
Paged to bedside by RN to assess right groin.  Patient was found to have original dressing from recent cath still in place.  Right groin has been cleaned and there is a small hematoma present.  No significant bruising or induration into his thigh, lower abdomen, or right flank.  No tenderness to palpation in his lower abdomen or groin.  I do not think that there is enough bruising here to explain is right hip pain.  Repeat hgb is still pending.  Until we have evidence of acute blood loss anemia or hemodynamic instability, we will continue Xarelto for now and defer CT imaging.

## 2016-06-16 NOTE — Consult Note (Signed)
   [facility] Inpatient Consult   06/16/2016  Joshua [name] 10/19/1940 [id number]   Patient re-hospitalized and assess for hospitalization.  Patient is a new consult to [facility] Management team from last weeks' hospitalization. Chart review reveals patient was weaker at home and falling advised to come to ED from HF clinic.  Patient with a history of chronic systolic and diastolic HF with EF documented as 25-35%.  [facility] will follow for progress and disposition needs.  Updated the inpatient RNCM regarding home health information with [facility].  For questions please contact:  Natividad Brood, RN BSN [facility] Liaison  [phone] business mobile phone Toll free office [phone]

## 2016-06-16 NOTE — Care Management Note (Addendum)
Case Management Note  Patient Details  Name: Joshua [name] MRN: [id number] Date of Birth: April 10, 1940  Subjective/Objective:    Weakness                Action/Plan: Patient known to me from previous admission; Patient fell at home several times; awaiting for PT eval for possible SNF placement; SW is aware; CM will continue to follow for DCP; B Lima Chillemi RN  06/11/2016-Action/Plan: CM talked to patient with his mother at the bedside; PCP: McGowen, Adrian Blackwater, MD; has private insurance with [facility] with prescription drug coverage; pharmacy of choice is [facility], no problem with medication and they can deliver his medication to his home if needed; [place] choice offered, pt/ mother requested [facility] [place] Go), [place] with [facility] called for arrangements; patient has home oxygen through [facility], cane, rolater (walker with seat) at home; Mindi Slicker RN  Expected Discharge Date:      Possible 06/17/2016            Expected Discharge Plan:  [place]  In-House Referral:   [facility]  Discharge planning Services  CM Consult  [facility] Agency:  [facility] (now [facility])  Status of Service:  In process, will continue to follow  Sherrilyn Rist [phone] 06/16/2016, 12:57 PM

## 2016-06-16 NOTE — Patient Outreach (Signed)
[facility]) Care Management  06/16/2016  Joshua [name] 10-Nov-1940 [id number]  Subjective: "I have felt better"  Objective: none  Assessment: Client hospitalized 5/31-6/7 with respiratory failure, heart failure. History of chronic diastolic CHF, COPD on home 02, COPD, history of asbestos exposure, HTN, HLD, and DM.  RNCM called to follow up regarding 24 hour nurse advice line call: "father is extremely weak, fallen 3 times in 24 hours". RNCM spoke with client who reports he has been better. Client refers call to his wife. [name] reports client has not been walking at all-started this weekend. He is using a bedside commode. Per [name], client walked into his home and to the bedroom when he came home from the hospital. She reports client was getting around without difficulty up until Saturday. She states it is as if his legs give out. When asked about dizziness, [name] reports client reported he thought he may have passed out and had some confusion this weekend. [name] reports she does not think that she could get client up and out of the home at this time. She reports his blood pressure has been a little low 97/57 this morning. Blood sugar this morning was 74. His weight today 229, yesterday it was 231.  RNCM called advanced heart failure clinic-spoke with [name] regarding client's signs/symptoms. Heart failure clinic to follow up with client.  [name] reports they have not heard from home health agency. RNCM called [facility] regarding start of care date.  Plan: continue to follow.  Joshua Silversmith, RN, MSN, [place] Coordinator Cell: [phone]

## 2016-06-16 NOTE — H&P (Signed)
History and Physical    TERRY BOLOTIN JAS:[id number] DOB: 05/03/1940 DOA: 06/15/2016  PCP: Tammi Sou, MD  Cardiology: [name]  Patient coming from: Home  Chief Complaint: Generalized weakness since discharge, near syncope, recurrent falls, right hip pain  HPI: Joshua [name] is a 76 y.o. gentleman with a history of paroxysmal atrial fibrillation (CHADS-Vasc score of at least 3, anticoagulated with Xarelto), CAD S/P CABG, chronic combined systolic and diastolic CHF (EF 41-93% by echo last week), COPD with chronic respiratory failure (oxygen 2.5L [place] at baseline), PAD, Type 2 DM, and HTN who was just admitted from 5/31 - 6/7 for management of acute on chronic respiratory failure secondary to CHF and COPD exacerbations.  He diuresed well and was discharged at a weight of 233 lbs.  The patient has been taking his medications as prescribed at discharge.  Per family, he was demonstrating generalized weakness over the weekend and fell three times in his home.  On episode was associated with syncope, but it was unwitnessed because the patient was in his bathroom when it happened.  He is not sure how long he was unconscious, but he could not remember falling.  The patient was locked in the bathroom and stayed on the ground until family was able to get in (about one hour).  He has had recurrent right hip pain since that fall.  No chest pain or shortness of breath.  No palpitations.  No fever, chills, or sweats.  No nausea or vomiting.  No swelling.  The patient rates his pain 2-3 out of 10 at this time.  Family is interested in placement.  ED Course: EKG showed NSR with LBBB (not new).  WBC count 14.  CO2 33 but this is chronically elevated and lower than levels last week.  BUN 62, creatinine 1.54 (creatinine was 1.24 at discharge).  CT head negative for acute process.  Right hip xray negative for fracture or dislocated hardware.  Right femur xray negative for acute fracture or dislocation.  The patient  received a 250cc NS bolus in the ED.  Hospitalist asked to admit.  Review of Systems: As per HPI otherwise 10 systems reviewed and negative.   Past Medical History:  Diagnosis Date  . Acute respiratory distress 06/04/2016  . Adenomatous colon polyp 10/16/2011   Repeat 2018  . Arthritis     Hips, R>L & KNEES  . CAD, multiple vessel    3V CAD cath 08/08/13----CABG done shortly after.  . 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 pylori neg, no dysplasia or malignancy.  . Chronic diastolic heart failure ([place]) 2016  . Chronic renal insufficiency, stage II (mild)   . COPD (chronic obstructive pulmonary disease) ([place])    GOLD II.  Spirometry  2004 borderline obstruction; 2015 mod obst: noncompliant with bid symbicort so pulmonologist switched him to a once daily inhaler: Trelegy ellipta 12/2015.  . Diabetic nephropathy ([place])    Elevated urine microalb/cr 03/2011  . DM type 2 (diabetes mellitus, type 2) ([place])    Poor control on max oral meds--pt eventually agreed to insulin therapy.  As of 2017 his DM is being managed by Dr. Dwyane Dee in endo.  . DOE (dyspnea on exertion)    COPD + chronic diastolic HF  . Erectile dysfunction    Normal testosterone  . Hearing loss of both ears 2016   Hearing aids  . Hyperlipidemia   . Hyperplastic colon polyp 2001  . Hypertension   . Iron  deficiency anemia 2014   03/2012 capsule endoscopy showed 2 AVMs--likely responsible for his IDA--lifetime iron supp recommended + q77moCBCs.  . Macular degeneration, dry    Mild, bilat (Optometrist, DMayford Knifeat [place] [employer] [place]  . Obesity   . Open toe wound 11/2015   Wound care clinic appt made and then canceled when toe improved.  . Other and unspecified angina pectoris   . PAD (peripheral artery disease) ([place] 02/2016   Abnormal ABI's and waveforms: LE arterial duplex ordered for f/u as per cardiologist's recommendation.  Vasc eval by Dr. CBridgett Larsson impression was  minimal PAD, recommended maximize med mgmt.  .Marland KitchenPAF (paroxysmal atrial fibrillation) ([place] 10/2014   xarelto  . Pericardial effusion with cardiac tamponade 6//29/15   pericardiocentesis was done,  Infectious/inflamm (cytology showed NO MALIGNANT CELLS)  . Pleural plaque    asbestos exposure x 2 yrs: Dr. MVaughan Brownerordered CT chest for further eval on 02/2016  . Pneumonia   . Tobacco dependence in remission    100+ pack-yr hx: quit after CABG    Past Surgical History:  Procedure Laterality Date  . APPENDECTOMY  1957  . CARDIAC CATHETERIZATION  08/08/2013  . CATARACT EXTRACTION W/ INTRAOCULAR LENS  IMPLANT, BILATERAL  04/08/2006 & 04/22/2006  . CATARACT EXTRACTION W/ INTRAOCULAR LENS  IMPLANT, BILATERAL Bilateral   . CHOLECYSTECTOMY OPEN  1999  . COLONOSCOPY  10/16/2011   Procedure: COLONOSCOPY;  Surgeon: RInda Castle MD;  [facility];  Service: Endoscopy;  Laterality: N/A;  . CORONARY ARTERY BYPASS GRAFT N/A 08/14/2013   Procedure: CORONARY ARTERY BYPASS GRAFTING (CABG);  Surgeon: SMelrose Nakayama MD;  [facility]  Service: Open Heart Surgery;  Laterality: N/A;  Times 4   using left internal mammary artery and endoscopically harvested bilateral saphenous vein  . ESOPHAGOGASTRODUODENOSCOPY  02/25/12   Atrophic gastritis with a few erosions--capsule endoscopy planned as of 02/25/12 (Dr. KDeatra Ina.  .Marland KitchenHARDWARE REMOVAL Right 08/12/2012   Procedure: HARDWARE REMOVAL;  Surgeon: CMcarthur Rossetti MD;  [facility];  Service: Orthopedics;  Laterality: Right;  . HIP SURGERY Right 1954   Repair of slipped capital femoral epiphysis.  . INTRAOPERATIVE TRANSESOPHAGEAL ECHOCARDIOGRAM N/A 08/14/2013   Normal LV function. Procedure: INTRAOPERATIVE TRANSESOPHAGEAL ECHOCARDIOGRAM;  Surgeon: SMelrose Nakayama MD;  [facility]  Service: Open Heart Surgery;  Laterality: N/A;  . LEFT HEART CATHETERIZATION WITH CORONARY ANGIOGRAM N/A 08/08/2013   Procedure: LEFT HEART CATHETERIZATION  WITH CORONARY ANGIOGRAM;  Surgeon: JJettie Booze MD;  [facility];  Service: Cardiovascular;  Laterality: N/A;  . PATELLA FRACTURE SURGERY Left ~ 1979   bolt + 3 screws to repair tib plateau fx  . PERICARDIAL TAP N/A 07/01/2013   Procedure: PERICARDIAL TAP;  Surgeon: JJettie Booze MD;  [facility];  Service: Cardiovascular;  Laterality: N/A;  . RIGHT/LEFT HEART CATH AND CORONARY ANGIOGRAPHY N/A 06/10/2016   Procedure: Right/Left Heart Cath and Coronary Angiography;  Surgeon: MLarey Dresser MD;  [facility];  Service: Cardiovascular;  Laterality: N/A;  . TESTICLE SURGERY  as a child   Undescended testicle brought down into scrotum  . TONSILLECTOMY  1947  . TOTAL HIP ARTHROPLASTY Right 08/12/2012   Procedure: REMOVAL OF OLD PINS RIGHT HIP AND RIGHT TOTAL HIP ARTHROPLASTY ANTERIOR APPROACH;  Surgeon: CMcarthur Rossetti MD;  [facility];  Service: Orthopedics;  Laterality: Right;  . TRANSTHORACIC ECHOCARDIOGRAM  10/30/14   Mod LVH, EF 60-65%,  normal wall motion, mod mitral regurg, mild PAH     reports that he quit smoking about 7 months ago. His smoking use included Cigarettes. He has a 15.50 pack-year smoking history. He quit smokeless tobacco use about 2 years ago. He reports that he does not drink alcohol or use drugs.  He is married.  He has at least two adult children.  Allergies  Allergen Reactions  . Morphine And Related Other (See Comments)    Drenched with perspiration  . Demerol [Meperidine] Nausea Only  . Starlix [Nateglinide] Other (See Comments)    gassy    Family History  Problem Relation Age of Onset  . Heart disease Father   . Heart attack Father   . CVA Mother   . Hypertension Mother   . Diabetes Paternal Grandmother   . Breast cancer Sister   . Diabetes Maternal Uncle   . Heart disease Maternal Uncle   . Stroke Neg Hx      Prior to Admission medications   Medication Sig Start Date End Date Taking?  Authorizing Provider  acetaminophen (TYLENOL) 325 MG tablet Take 2 tablets (650 mg total) by mouth every 4 (four) hours as needed for headache or mild pain. 08/09/13  Yes Kilroy, Doreene Burke, PA-C  Ascorbic Acid (VITAMIN C) 1000 MG tablet Take 1,000 mg by mouth daily.   Yes [provider]  aspirin 81 MG tablet Take 81 mg by mouth at bedtime. Reported on 02/26/2015   Yes [provider]  atorvastatin (LIPITOR) 40 MG tablet TAKE ONE TABLET BY MOUTH EVERY DAY 02/17/16  Yes McGowen, Adrian Blackwater, MD  bisoprolol (ZEBETA) 5 MG tablet Take 1 tablet (5 mg total) by mouth daily. 06/12/16  Yes Shirley Friar, PA-C  budesonide-formoterol [facility]) 160-4.5 MCG/ACT inhaler Inhale 2 puffs into the lungs 2 (two) times daily. 05/17/15  Yes McGowen, Adrian Blackwater, MD  buPROPion [facility] SR) 150 MG 12 hr tablet TAKE ONE TABLET BY MOUTH TWICE DAILY 12/17/15  Yes McGowen, Adrian Blackwater, MD  Cholecalciferol (VITAMIN D-3) 1000 units CAPS Take 1,000 Units by mouth daily. Reported on 02/26/2015   Yes [provider]  Chromium Picolinate 1000 MCG TABS Take 1,000 mcg by mouth daily. Reported on 02/26/2015   Yes [provider]  citalopram (CELEXA) 20 MG tablet Take 1 tablet (20 mg total) by mouth daily. 06/12/16  Yes Shirley Friar, PA-C  digoxin (LANOXIN) 0.125 MG tablet Take 1 tablet (0.125 mg total) by mouth daily. 06/12/16  Yes Shirley Friar, PA-C  fluticasone [facility]) 50 MCG/ACT nasal spray Place 2 sprays into both nostrils daily. 06/04/16  Yes McGowen, Adrian Blackwater, MD  gabapentin (NEURONTIN) 100 MG capsule TAKE 2 CAPSULES BY MOUTH 3 TIMES DAILY Patient taking differently: take 2 capsules in the morning and 3 capsules at bedtime 12/02/15  Yes McGowen, Adrian Blackwater, MD  guaiFENesin (MUCINEX) 600 MG 12 hr tablet Take 2 tablets (1,200 mg total) by mouth 2 (two) times daily. 11/02/14  Yes Verlee Monte, MD  Insulin Glargine (TOUJEO SOLOSTAR) 300 UNIT/ML SOPN Inject 70 Units into the skin  daily. Patient taking differently: Inject 60 Units into the skin daily.  04/20/16  Yes Elayne Snare, MD  insulin regular (NOVOLIN R,HUMULIN R) 100 units/mL injection Inject 16 units SQ with BF, 17 units SQ with Lunch, and 18 U SQ with Supper Patient taking differently: 28 units in the am and at lunch and 28 at bedtime 09/27/15  Yes McGowen, Adrian Blackwater, MD  isosorbide mononitrate (IMDUR)  30 MG 24 hr tablet TAKE ONE TABLET BY MOUTH DAILY 02/17/16  Yes McGowen, Adrian Blackwater, MD  mometasone-formoterol (DULERA) 100-5 MCG/ACT AERO Inhale 2 puffs into the lungs 2 (two) times daily. 06/11/16  Yes Shirley Friar, PA-C  Multiple Vitamin (MULTIVITAMIN WITH MINERALS) TABS Take 1 tablet by mouth daily.   Yes [provider]  nitroGLYCERIN (NITROSTAT) 0.4 MG SL tablet Place 1 tablet (0.4 mg total) under the tongue every 5 (five) minutes as needed for chest pain. 01/31/16  Yes Jettie Booze, MD  potassium chloride SA (K-DUR,KLOR-CON) 20 MEQ tablet Take 1 tablet (20 mEq total) by mouth daily. 06/11/16  Yes Shirley Friar, PA-C  PROAIR HFA 108 (90 Base) MCG/ACT inhaler INHALE 2 PUFFS INTO THE LUNGS EVERY 4 HOURS AS NEEDED FOR WHEEZING ORSHORTNESS OF BREATH 06/05/16  Yes McGowen, Adrian Blackwater, MD  rivaroxaban (XARELTO) 20 MG TABS tablet Take 1 tablet (20 mg total) by mouth daily with supper. 03/19/16  Yes Jettie Booze, MD  spironolactone (ALDACTONE) 25 MG tablet Take 1 tablet (25 mg total) by mouth daily. 06/12/16  Yes Shirley Friar, PA-C  SYNJARDY 05-998 MG TABS TAKE ONE TABLET BY MOUTH TWICE DAILY AFTER A MEAL 02/17/16  Yes Elayne Snare, MD  tamsulosin (FLOMAX) 0.4 MG CAPS capsule TAKE 1 CAPSULE BY MOUTH EVERY DAY AFTER SUPPER 05/06/16  Yes McGowen, Adrian Blackwater, MD  torsemide (DEMADEX) 20 MG tablet Take 2 tablets (40 mg total) by mouth daily. 06/12/16  Yes Shirley Friar, PA-C    Physical Exam: Vitals:   06/15/16 2345 06/16/16 0000 06/16/16 0015 06/16/16 0030  BP: (!) 113/44 (!)  113/49 (!) 118/59 (!) 124/49  Pulse: (!) 57 (!) 56 (!) 57 (!) 56  Resp: _0 Temp:      TempSrc:      SpO2: 97% 99% 94% 98%  Weight:      Height:          Constitutional: NAD, calm but frail and chronically ill appearing Vitals:   06/15/16 2345 06/16/16 0000 06/16/16 0015 06/16/16 0030  BP: (!) 113/44 (!) 113/49 (!) 118/59 (!) 124/49  Pulse: (!) 57 (!) 56 (!) 57 (!) 56  Resp: _1 Temp:      TempSrc:      SpO2: 97% 99% 94% 98%  Weight:      Height:       Eyes: PERRL, lids and conjunctivae normal ENMT: Mucous membranes are moist. Posterior pharynx clear of any exudate or lesions. Neck: normal appearance, supple, no masses Respiratory: clear to auscultation bilaterally, no wheezing, no crackles. Normal respiratory effort. No accessory muscle use.  Cardiovascular: Bradycardic but regular.  No murmurs / rubs / gallops. No extremity edema. Weak pulses in bilateral lower extremities. GI: abdomen is soft and compressible.  No distention.  No tenderness.  Bowel sounds are present. Musculoskeletal:  No joint deformity in upper and lower extremities. Reduced ROM in his right lower extremity, which he says is not new.  No contractures. Normal muscle tone.  Skin: no rashes, warm and dry, mild bruising at right flank; I did not palpate significant hematoma. Neurologic: CN 2-12 grossly intact. Sensation intact.  Generalized weakness. Psychiatric: Normal judgment and insight. Alert and oriented x 3. Normal mood.     Labs on Admission: I have personally reviewed following labs and imaging studies  CBC:  Recent Labs Lab 06/09/16 0337 06/10/16 0415 06/11/16 0431 06/15/16 1913  WBC 10.9* 11.0* 11.9* 14.0*  NEUTROABS  --   --   --  10.1*  HGB 11.9* 12.5* 13.6 14.2  HCT 39.8 41.8 45.5 46.0  MCV 84.1 84.3 84.7 84.2  PLT 208 217 253 846   Basic Metabolic Panel:  Recent Labs Lab 06/09/16 0337 06/10/16 0415 06/11/16 0431 06/15/16 1913  NA 132* 138 138 143  K 3.9  3.5 3.4* 3.8  CL 87* 90* 85* 93*  CO2 37* 36* 38* 33*  GLUCOSE 255* 208* 98 155*  BUN 57* 60* 57* 62*  CREATININE 1.29* 1.17 1.24 1.54*  CALCIUM 8.3* 8.8* 8.9 9.6  MG  --   --   --  2.3   GFR: Estimated Creatinine Clearance: 49.3 mL/min (A) (by C-G formula based on SCr of 1.54 mg/dL (H)). Liver Function Tests:  Recent Labs Lab 06/15/16 1913  AST 27  ALT 32  ALKPHOS 82  BILITOT 0.9  PROT 6.3*  ALBUMIN 3.3*   CBG:  Recent Labs Lab 06/10/16 1628 06/10/16 2145 06/11/16 0753 06/11/16 1208 06/15/16 2209  GLUCAP 343* 360* 128* 231* 129*   Urine analysis:    Component Value Date/Time   COLORURINE YELLOW 06/15/2016 2300   APPEARANCEUR CLEAR 06/15/2016 2300   LABSPEC 1.010 06/15/2016 2300   PHURINE 5.0 06/15/2016 2300   GLUCOSEU >=500 (A) 06/15/2016 2300   HGBUR NEGATIVE 06/15/2016 2300   BILIRUBINUR NEGATIVE 06/15/2016 2300   KETONESUR NEGATIVE 06/15/2016 2300   PROTEINUR NEGATIVE 06/15/2016 2300   UROBILINOGEN 0.2 08/11/2013 1624   NITRITE NEGATIVE 06/15/2016 2300   LEUKOCYTESUR NEGATIVE 06/15/2016 2300    Radiological Exams on Admission: Ct Head Wo Contrast  Result Date: 06/15/2016 CLINICAL DATA:  Status post falls, with right leg weakness. Initial encounter. EXAM: CT HEAD WITHOUT CONTRAST TECHNIQUE: Contiguous axial images were obtained from the base of the skull through the vertex without intravenous contrast. COMPARISON:  CT of the head performed 04/04/2015 FINDINGS: Brain: No evidence of acute infarction, hemorrhage, hydrocephalus, extra-axial collection or mass lesion/mass effect. Prominence of the ventricles and sulci reflects mild cortical volume loss. 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 Mild cerebellar atrophy is noted. The brainstem and fourth ventricle are within normal limits. The cerebral hemispheres demonstrate grossly normal gray-white differentiation. No mass effect or midline shift is seen. Vascular: No hyperdense vessel or  unexpected calcification. Skull: There is no evidence of fracture; visualized osseous structures are unremarkable in appearance. Sinuses/Orbits: The orbits are within normal limits. The paranasal sinuses and mastoid air cells are well-aerated. Other: No significant soft tissue abnormalities are seen. IMPRESSION: 1. No evidence of traumatic intracranial injury or fracture. 2. Mild cortical volume loss. 3. Chronic lacunar infarcts at the right basal ganglia. Electronically Signed   By: Garald Balding M.D.   On: 06/15/2016 21:23   Dg Hip Unilat W Or Wo Pelvis 1 View Right  Result Date: 06/15/2016 CLINICAL DATA:  Right hip pain after fall on Saturday. Hip replacement in 2015. EXAM: DG HIP (WITH OR WITHOUT PELVIS) 1V RIGHT COMPARISON:  None. FINDINGS: There is no evidence of acute hip fracture or dislocation. The bony pelvis appears intact lower lumbar degenerative disc and facet arthropathy. No pelvic diastasis. The native left hip joint is maintained. Intact uncemented right hip arthroplasty without asymmetric lining wear. Mild heterotopic bone formation is seen about the right hip consistent with postop change. There is no evidence of arthropathy or other focal bone abnormality. IMPRESSION: Intact right uncemented hip arthroplasty with postop change. No acute displaced fracture is seen. Electronically Signed  By: Ashley Royalty M.D.   On: 06/15/2016 21:47   Dg Femur 1 View Right  Result Date: 06/15/2016 CLINICAL DATA:  Patient fell with right-sided lower extremity pain. EXAM: RIGHT FEMUR 1 VIEW COMPARISON:  None. FINDINGS: Overlapping AP views were provided as requested. This limits assessment for displacement and dislocations in the antero posterior plane. There is an intact uncemented right hip arthroplasty with postop heterotopic new bone formation about the right hip. Common femoral through popliteal calcific atherosclerosis. Vascular clips are noted bilaterally along the medial aspect of the distal thighs.  Femorotibial joint space narrowing with chondrocalcinosis is seen about the knee. The visualized pubic rami appear intact. IMPRESSION: Negative for acute fracture or gross malalignment. Electronically Signed   By: Ashley Royalty M.D.   On: 06/15/2016 21:50    EKG: Independently reviewed. NSR with LBBB (previously documented).  Assessment/Plan Principal Problem:   Weakness Active Problems:   HTN (hypertension), benign   COPD (chronic obstructive pulmonary disease) (HCC)   PAF (paroxysmal atrial fibrillation) (HCC)   S/P CABG x 4   Diabetes mellitus with complication (HCC)   AKI (acute kidney injury) (HCC)   CHF (congestive heart failure) (HCC)   Fall      Generalized weakness and recurrent falls after prolonged hospitalization and aggressive diuresis.  Likely multifactorial (debility, overdiuresis, possible orthostasis). --PT and OT eval and treat in the AM --Fall precautions --Check orthostatic vital signs --HOLD diuretics for now --Will repeat labs before giving additional fluid  Right hip pain after fall; etiology unclear (contusion vs hematoma vs rhabdo) --Check CPK level (he was down for an hour on Saturday and he has mild changes in his BUN/creatinine --Follow hgb.  CT imaging deferred for now; may need to reconsider if now improvement in 24 hours --Acetaminophen prn for mild pain --PT eval and treat  Combined systolic and diastolic heart failure --Appears euvolemic --Holding diuretics for at least the first 24 hours  PAF --Anticoagulated with Xarelto --Check digoxin level  History of CAD S/P CABG --Baby aspirin, beta blocker, statin  Diabetes --Continue home long-acting insulin dose --Sliding scale AC/HS  COPD --on 2.5L [place] at baseline --continue Dulera --Duonebs prn  AKI --Holding diuretics, cautious hydration.  Repeat BMP pending. --U/A does not suggest infection   DVT prophylaxis: Anticoagulated with Xarelto Code Status: FULL Family Communication:  Daughter and daughter-in-law present in the ED at time of admission. Disposition Plan: SNF Consults called: NONE Admission status: Place in observation with telemetry monitoring.   TIME SPENT: 70 minutes   Eber Jones MD [facility] Pager [phone]  If 7PM-7AM, please contact night-coverage [email or web address] Password [email or web address]  06/16/2016, 2:15 AM

## 2016-06-16 NOTE — Evaluation (Addendum)
Physical Therapy Evaluation Patient Details Name: Joshua [name] MRN: [id number] DOB: 12-24-1940 Today's Date: 06/16/2016   History of Present Illness  76 yo male admitted with R groin hematoma, s/p fall with R hip pain. CT neg  Clinical Impression  Pt is generally weak and deconditioned, he has fallen several times recently and wife tries to prevent his falls and has gotten bruised as well.  He would benefit from SNF level rehab at discharge to help him get stronger and prevent more falls at home which could injure both him and his wife.   PT to follow acutely for deficits listed below.       Follow Up Recommendations SNF    Equipment Recommendations  None recommended by PT    Recommendations for Other Services   NA    Precautions / Restrictions Precautions Precautions: Fall Precaution Comments: reports 3 falls at night time ; requires O2 baseline      Mobility  Bed Mobility Overal bed mobility: Needs Assistance Bed Mobility: Supine to Sit     Supine to sit: Min assist     General bed mobility comments: Pt is OOB in the recliner chair.   Transfers Overall transfer level: Needs assistance Equipment used: Rolling walker (2 wheeled) Transfers: Sit to/from Stand Sit to Stand: Min assist         General transfer comment: pt requires rocking momentum to power up and multiple attempts from lower recliner chair.  Verbal cues for hand placement and to scoot to the edge of the chair to get up.    Ambulation/Gait Ambulation/Gait assistance: Min assist Ambulation Distance (Feet): 20 Feet Assistive device: Rolling walker (2 wheeled) Gait Pattern/deviations: Step-through pattern;Shuffle     General Gait Details: Pt with stiff legged gait pattern, slow to move and imbalanced even with the support of the RW.  DOE remained controlled during gait on 3 L O2 [place].  Pt did not feel he could go further than the door to his room and wife reports he has to walk twice as far to get to the  den in his house.          Balance Overall balance assessment: Needs assistance Sitting-balance support: Feet supported Sitting balance-Leahy Scale: Good     Standing balance support: Bilateral upper extremity supported Standing balance-Leahy Scale: Poor Standing balance comment: relies on RW for balance and needed external assist today as well.                              Pertinent Vitals/Pain Pain Assessment: No/denies pain    Home Living Family/patient expects to be discharged to:: Private residence Living Arrangements: Spouse/significant other Available Help at Discharge: Family;Available 24 hours/day Type of Home: House Home Access: Ramped entrance     Home Layout: One level Home Equipment: Walker - 4 wheels;Cane - single point;Shower seat;Grab bars - toilet;Grab bars - tub/shower;Hand held shower head;Adaptive equipment;Bedside commode;Other (comment) Additional Comments: lift chair, bed has HOB / Leg elevation functions, normally 2.5 [place] at baseline,     Prior Function Level of Independence: Independent with assistive device(s)         Comments: drives, cane inside, sits to shower, sock aid for L foot, rollator for community     Hand Dominance   Dominant Hand: Right    Extremity/Trunk Assessment   Upper Extremity Assessment Upper Extremity Assessment: Defer to OT evaluation    Lower Extremity Assessment Lower Extremity Assessment: RLE  deficits/detail;LLE deficits/detail RLE Deficits / Details: bil legs are generally weak, he reports they give out on him and buckle making him fall, he is very stiff legged during gait and reports he uses a lift chair at home which has likely helped lead to his immobility and weakness.  He reports he spends most of the day "in bed watching TV".  He has been to SNF level reahb in the past, done well, gotten stronger and then once back home gets back into the pattern of not moving and gets weak again.   LLE Deficits  / Details: Left leg is functionally similar to right leg, generally weak with poor functional endurance.     Cervical / Trunk Assessment Cervical / Trunk Assessment: Normal  Communication   Communication: HOH  Cognition Arousal/Alertness: Awake/alert Behavior During Therapy: WFL for tasks assessed/performed Overall Cognitive Status: Within Functional Limits for tasks assessed                                        General Comments General comments (skin integrity, edema, etc.): Pt reports his most recent falls have been without the rollator at home.  Wife reports she thinks he should use it more and I encouraged him to use it all the time at this point.  Per wife, she has tried to prevent him from falling and gotten injured (bruised) herself.  She usually has to call her son to help get him off of the floor when he falls.         Assessment/Plan    PT Assessment Patient needs continued PT services  PT Problem List Decreased strength;Decreased balance;Decreased activity tolerance;Decreased mobility;Decreased knowledge of use of DME;Cardiopulmonary status limiting activity       PT Treatment Interventions DME instruction;Gait training;Functional mobility training;Therapeutic activities;Balance training;Therapeutic exercise;Patient/family education    PT Goals (Current goals can be found in the Care Plan section)  Acute Rehab PT Goals Patient Stated Goal: to get stronger PT Goal Formulation: With patient Time For Goal Achievement: 06/30/16 Potential to Achieve Goals: Good    Frequency Min 3X/week   Barriers to discharge Decreased caregiver support wife is not strong enough to continue to soften his falls.  She is at risk for getting hurt.        AM-PAC PT "6 Clicks" Daily Activity  Outcome Measure Difficulty turning over in bed (including adjusting bedclothes, sheets and blankets)?: Total Difficulty moving from lying on back to sitting on the side of the bed?  : Total Difficulty sitting down on and standing up from a chair with arms (e.g., wheelchair, bedside commode, etc,.)?: Total Help needed moving to and from a bed to chair (including a wheelchair)?: A Little Help needed walking in hospital room?: A Little Help needed climbing 3-5 steps with a railing? : A Lot 6 Click Score: 11    End of Session Equipment Utilized During Treatment: Gait belt;Oxygen Activity Tolerance: Patient limited by fatigue Patient left: in chair;with call bell/phone within reach;with chair alarm set   PT Visit Diagnosis: Repeated falls (R29.6);Muscle weakness (generalized) (M62.81);Difficulty in walking, not elsewhere classified (R26.2)    Time: 1412-1500 PT Time Calculation (min) (ACUTE ONLY): 48 min   06/29/16 1612  PT G-Codes **NOT FOR INPATIENT CLASS**  Functional Assessment Tool Used AM-PAC 6 Clicks Basic Mobility  Functional Limitation Mobility: Walking and moving around  Mobility: Walking and Moving Around Current Status (H7414) CL  Mobility: Walking and Moving Around Goal Status [phone]) CJ     Charges:         Barbarann Ehlers. Saraia Platner, PT, DPT [phone]   PT Evaluation $PT Eval Moderate Complexity: 1 Procedure PT Treatments $Gait Training: 8-22 mins   06/16/2016, 4:25 PM

## 2016-06-16 NOTE — Progress Notes (Signed)
Handover received from the night shift and started care from 7am, pt's arrival on unit was 02:46am, Initial and admission assessment done at the same time, pt's condom catheter changed as it found leaking, pt does not have any complain of pain this moment, orthostatic vitals taken, pt's is on oxygen _0 /min, wife is in bed side and is updating, will continue to monitor the patient.

## 2016-06-16 NOTE — Progress Notes (Signed)
Inpatient Diabetes Program Recommendations  AACE/ADA: New Consensus Statement on Inpatient Glycemic Control (2015)  Target Ranges:  Prepandial:   less than 140 mg/dL      Peak postprandial:   less than 180 mg/dL (1-2 hours)      Critically ill patients:  140 - 180 mg/dL   Lab Results  Component Value Date   GLUCAP 263 (H) 06/16/2016   HGBA1C 7.3 (H) 06/04/2016    Review of Glycemic Control:  Results for ELL, TISO (MRN [id number]) as of 06/16/2016 13:02  Ref. Range 06/15/2016 22:09 06/16/2016 02:38 06/16/2016 07:35 06/16/2016 12:09  Glucose-Capillary Latest Ref Range: 65 - 99 mg/dL 129 (H) 190 (H) 144 (H) 263 (H)   Diabetes history: Type 2 diabetes Outpatient Diabetes medications: Toujeo 70 units daily, Regular insulin- 16 units breakfast, 17 units lunch, and 18 units supper Current orders for Inpatient glycemic control: Lantus 60 units daily, Novolog moderate tid with meals and HS Inpatient Diabetes Program Recommendations:    Please consider adding Novolog meal coverage 8 units tid with meals (hold if patient eats less than 50%).  Thanks, Adah Perl, RN, BC-ADM Inpatient Diabetes Coordinator Pager [phone] (8a-5p)

## 2016-06-16 NOTE — Progress Notes (Signed)
PROGRESS NOTE  Joshua [name]  BXI:[id number] DOB: Jun 28, 1940 DOA: 06/15/2016 PCP: Tammi Sou, MD Outpatient Specialists:  Subjective: Patient seen with his wife at bedside, feeling weak but no shortness of breath or orthopnea.  Brief Narrative:  Joshua [name] is a 76 y.o. gentleman with a history of paroxysmal atrial fibrillation (CHADS-Vasc score of at least 3, anticoagulated with Xarelto), CAD S/P CABG, chronic combined systolic and diastolic CHF (EF 72-90% by echo last week), COPD with chronic respiratory failure (oxygen 2.5L [place] at baseline), PAD, Type 2 DM, and HTN who was just admitted from 5/31 - 6/7 for management of acute on chronic respiratory failure secondary to CHF and COPD exacerbations.  He diuresed well and was discharged at a weight of 233 lbs.  The patient has been taking his medications as prescribed at discharge.  Per family, he was demonstrating generalized weakness over the weekend and fell three times in his home.  On episode was associated with syncope, but it was unwitnessed because the patient was in his bathroom when it happened.  He is not sure how long he was unconscious, but he could not remember falling.  The patient was locked in the bathroom and stayed on the ground until family was able to get in (about one hour).  He has had recurrent right hip pain since that fall.  No chest pain or shortness of breath.  No palpitations.  No fever, chills, or sweats.  No nausea or vomiting.  No swelling.  Assessment & Plan:   Principal Problem:   Weakness Active Problems:   HTN (hypertension), benign   COPD (chronic obstructive pulmonary disease) (HCC)   PAF (paroxysmal atrial fibrillation) (HCC)   S/P CABG x 4   Diabetes mellitus with complication (HCC)   AKI (acute kidney injury) ([place])   CHF (congestive heart failure) ([place])   Fall   This is a no charge note, patient seen earlier today by my colleague Dr. Eulas Post. Patient seen and examined, and data base  reviewed. Patient admitted to the hospital with generalized weakness and recurrent falls after stayed in the hospital for 8 days. Check orthostatic blood pressure, PT OT to evaluate and treat. Courtesy notification to cardiology  Generalized weakness and recurrent falls after prolonged hospitalization and aggressive diuresis.  Likely multifactorial (debility, overdiuresis, possible orthostasis). --PT and OT eval and treat in the AM --Fall precautions --Check orthostatic vital signs --HOLD diuretics for now --Will repeat labs before giving additional fluid  Right hip pain after fall; likely post calf hematoma --Patient has bruising in his right groin, likely secondary to recent cardiac catheterization in settings of anticoagulation. --Does not appear to have active bleeding going. Restarted anticoagulation.  Combined systolic and diastolic heart failure --Appears euvolemic --Holding diuretics for at least the first 24 hours  PAF --Anticoagulated with Xarelto --Check digoxin level  History of CAD S/P CABG --Baby aspirin, beta blocker, statin  Diabetes --Continue home long-acting insulin dose --Sliding scale AC/HS  COPD --on 2.5L [place] at baseline --continue Dulera --Duonebs prn  AKI --Holding diuretics, cautious hydration.  Repeat BMP pending. --U/A does not suggest infection   DVT prophylaxis:  Code Status: Full Code Family Communication:  Disposition Plan:  Diet: Diet heart healthy/carb modified Room service appropriate? Yes; Fluid consistency: Thin; Fluid restriction: 1500 mL Fluid  Consultants:   None  Procedures:   None  Antimicrobials:   None   Objective: Vitals:   06/16/16 0015 06/16/16 0030 06/16/16 0246 06/16/16 0937  BP: (!) 118/59 [name])  124/49 (!) 103/52   Pulse: (!) 57 (!) 56 (!) 57   Resp: _0 Temp:   98.9 F (37.2 C)   TempSrc:   Oral   SpO2: 94% 98% 100% 92%  Weight:   103.9 kg (229 lb)   Height:   5' 11.5" (1.816 m)      Intake/Output Summary (Last 24 hours) at 06/16/16 1139 Last data filed at 06/16/16 0726  Gross per 24 hour  Intake              240 ml  Output              850 ml  Net             -610 ml   Filed Weights   06/15/16 1617 06/16/16 0246  Weight: 103.9 kg (229 lb) 103.9 kg (229 lb)    Examination: General exam: Appears calm and comfortable  Respiratory system: Clear to auscultation. Respiratory effort normal. Cardiovascular system: S1 & S2 heard, RRR. No JVD, murmurs, rubs, gallops or clicks. No pedal edema. Gastrointestinal system: Abdomen is nondistended, soft and nontender. No organomegaly or masses felt. Normal bowel sounds heard. Central nervous system: Alert and oriented. No focal neurological deficits. Extremities: Symmetric 5 x 5 power. Skin: No rashes, lesions or ulcers Psychiatry: Judgement and insight appear normal. Mood & affect appropriate.   Data Reviewed: I have personally reviewed following labs and imaging studies  CBC:  Recent Labs Lab 06/10/16 0415 06/11/16 0431 06/15/16 1913 06/16/16 0233  WBC 11.0* 11.9* 14.0* 11.5*  NEUTROABS  --   --  10.1*  --   HGB 12.5* 13.6 14.2 13.2  HCT 41.8 45.5 46.0 45.0  MCV 84.3 84.7 84.2 85.9  PLT 217 253 225 622   Basic Metabolic Panel:  Recent Labs Lab 06/10/16 0415 06/11/16 0431 06/15/16 1913 06/16/16 0233  NA 138 138 143 139  K 3.5 3.4* 3.8 4.5  CL 90* 85* 93* 91*  CO2 36* 38* 33* 38*  GLUCOSE 208* 98 155* 197*  BUN 60* 57* 62* 59*  CREATININE 1.17 1.24 1.54* 1.54*  CALCIUM 8.8* 8.9 9.6 8.9  MG  --   --  2.3  --    GFR: Estimated Creatinine Clearance: 49.7 mL/min (A) (by C-G formula based on SCr of 1.54 mg/dL (H)). Liver Function Tests:  Recent Labs Lab 06/15/16 1913  AST 27  ALT 32  ALKPHOS 82  BILITOT 0.9  PROT 6.3*  ALBUMIN 3.3*   No results for input(s): LIPASE, AMYLASE in the last 168 hours. No results for input(s): AMMONIA in the last 168 hours. Coagulation Profile: No results for  input(s): INR, PROTIME in the last 168 hours. Cardiac Enzymes:  Recent Labs Lab 06/16/16 0233  CKTOTAL 47*   BNP (last 3 results) No results for input(s): PROBNP in the last 8760 hours. HbA1C: No results for input(s): HGBA1C in the last 72 hours. CBG:  Recent Labs Lab 06/11/16 0753 06/11/16 1208 06/15/16 2209 06/16/16 0238 06/16/16 0735  GLUCAP 128* 231* 129* 190* 144*   Lipid Profile: No results for input(s): CHOL, HDL, LDLCALC, TRIG, CHOLHDL, LDLDIRECT in the last 72 hours. Thyroid Function Tests: No results for input(s): TSH, T4TOTAL, FREET4, T3FREE, THYROIDAB in the last 72 hours. Anemia Panel: No results for input(s): VITAMINB12, FOLATE, FERRITIN, TIBC, IRON, RETICCTPCT in the last 72 hours. Urine analysis:    Component Value Date/Time   COLORURINE YELLOW 06/15/2016 [place] 06/15/2016 2300  LABSPEC 1.010 06/15/2016 2300   PHURINE 5.0 06/15/2016 2300   GLUCOSEU >=500 (A) 06/15/2016 2300   HGBUR NEGATIVE 06/15/2016 2300   BILIRUBINUR NEGATIVE 06/15/2016 2300   KETONESUR NEGATIVE 06/15/2016 2300   PROTEINUR NEGATIVE 06/15/2016 2300   UROBILINOGEN 0.2 08/11/2013 1624   NITRITE NEGATIVE 06/15/2016 2300   LEUKOCYTESUR NEGATIVE 06/15/2016 2300   Sepsis Labs: _0 (procalcitonin:4,lacticidven:4)  )No results found for this or any previous visit (from the past 240 hour(s)).   Invalid input(s): PROCALCITONIN, LACTICACIDVEN   Radiology Studies: Ct Head Wo Contrast  Result Date: 06/15/2016 CLINICAL DATA:  Status post falls, with right leg weakness. Initial encounter. EXAM: CT HEAD WITHOUT CONTRAST TECHNIQUE: Contiguous axial images were obtained from the base of the skull through the vertex without intravenous contrast. COMPARISON:  CT of the head performed 04/04/2015 FINDINGS: Brain: No evidence of acute infarction, hemorrhage, hydrocephalus, extra-axial collection or mass lesion/mass effect. Prominence of the ventricles and sulci reflects  mild cortical volume loss. 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 Mild cerebellar atrophy is noted. The brainstem and fourth ventricle are within normal limits. The cerebral hemispheres demonstrate grossly normal gray-white differentiation. No mass effect or midline shift is seen. Vascular: No hyperdense vessel or unexpected calcification. Skull: There is no evidence of fracture; visualized osseous structures are unremarkable in appearance. Sinuses/Orbits: The orbits are within normal limits. The paranasal sinuses and mastoid air cells are well-aerated. Other: No significant soft tissue abnormalities are seen. IMPRESSION: 1. No evidence of traumatic intracranial injury or fracture. 2. Mild cortical volume loss. 3. Chronic lacunar infarcts at the right basal ganglia. Electronically Signed   By: Garald Balding M.D.   On: 06/15/2016 21:23   Dg Hip Unilat W Or Wo Pelvis 1 View Right  Result Date: 06/15/2016 CLINICAL DATA:  Right hip pain after fall on Saturday. Hip replacement in 2015. EXAM: DG HIP (WITH OR WITHOUT PELVIS) 1V RIGHT COMPARISON:  None. FINDINGS: There is no evidence of acute hip fracture or dislocation. The bony pelvis appears intact lower lumbar degenerative disc and facet arthropathy. No pelvic diastasis. The native left hip joint is maintained. Intact uncemented right hip arthroplasty without asymmetric lining wear. Mild heterotopic bone formation is seen about the right hip consistent with postop change. There is no evidence of arthropathy or other focal bone abnormality. IMPRESSION: Intact right uncemented hip arthroplasty with postop change. No acute displaced fracture is seen. Electronically Signed   By: Ashley Royalty M.D.   On: 06/15/2016 21:47   Dg Femur 1 View Right  Result Date: 06/15/2016 CLINICAL DATA:  Patient fell with right-sided lower extremity pain. EXAM: RIGHT FEMUR 1 VIEW COMPARISON:  None. FINDINGS: Overlapping AP views were provided as requested. This  limits assessment for displacement and dislocations in the antero posterior plane. There is an intact uncemented right hip arthroplasty with postop heterotopic new bone formation about the right hip. Common femoral through popliteal calcific atherosclerosis. Vascular clips are noted bilaterally along the medial aspect of the distal thighs. Femorotibial joint space narrowing with chondrocalcinosis is seen about the knee. The visualized pubic rami appear intact. IMPRESSION: Negative for acute fracture or gross malalignment. Electronically Signed   By: Ashley Royalty M.D.   On: 06/15/2016 21:50        Scheduled Meds: . aspirin EC  81 mg Oral QHS  . atorvastatin  40 mg Oral Daily  . bisoprolol  5 mg Oral Daily  . buPROPion  150 mg Oral BID WC  . citalopram  20 mg Oral Daily  . digoxin  0.125 mg Oral Daily  . gabapentin  200 mg Oral BID  . insulin aspart  0-15 Units Subcutaneous TID WC  . insulin aspart  0-5 Units Subcutaneous QHS  . insulin glargine  60 Units Subcutaneous Daily  . isosorbide mononitrate  30 mg Oral Daily  . mometasone-formoterol  2 puff Inhalation BID  . potassium chloride SA  20 mEq Oral Daily  . rivaroxaban  20 mg Oral Q supper  . sodium chloride flush  3 mL Intravenous Q12H  . [START ON 06/17/2016] spironolactone  25 mg Oral Daily  . tamsulosin  0.4 mg Oral QPC supper  . [START ON 06/17/2016] torsemide  40 mg Oral Daily   Continuous Infusions:   LOS: 0 days    Time spent: 35 minutes    Aleksa Collinsworth A, MD [facility] Pager [phone]  If 7PM-7AM, please contact night-coverage [email or web address] Password [facility] 06/16/2016, 11:39 AM

## 2016-06-17 DIAGNOSIS — I5022 Chronic systolic (congestive) heart failure: Secondary | ICD-10-CM | POA: Diagnosis not present

## 2016-06-17 DIAGNOSIS — R531 Weakness: Secondary | ICD-10-CM | POA: Diagnosis not present

## 2016-06-17 DIAGNOSIS — N179 Acute kidney failure, unspecified: Secondary | ICD-10-CM

## 2016-06-17 DIAGNOSIS — E860 Dehydration: Secondary | ICD-10-CM

## 2016-06-17 DIAGNOSIS — I5040 Unspecified combined systolic (congestive) and diastolic (congestive) heart failure: Secondary | ICD-10-CM | POA: Diagnosis not present

## 2016-06-17 DIAGNOSIS — R296 Repeated falls: Secondary | ICD-10-CM

## 2016-06-17 LAB — BASIC METABOLIC PANEL
Anion gap: 10 (ref 5–15)
BUN: 43 mg/dL — AB (ref 6–20)
CALCIUM: 8.4 mg/dL — AB (ref 8.9–10.3)
CO2: 32 mmol/L (ref 22–32)
Chloride: 95 mmol/L — ABNORMAL LOW (ref 101–111)
Creatinine, Ser: 1.18 mg/dL (ref 0.61–1.24)
GFR calc Af Amer: >60 mL/min (ref >60)
GFR, EST NON AFRICAN AMERICAN: 58 mL/min — AB (ref >60)
GLUCOSE: 200 mg/dL — AB (ref 65–99)
Potassium: 3.2 mmol/L — ABNORMAL LOW (ref 3.5–5.1)
SODIUM: 137 mmol/L (ref 135–145)

## 2016-06-17 LAB — GLUCOSE, CAPILLARY
GLUCOSE-CAPILLARY: 186 mg/dL — AB (ref 65–99)
Glucose-Capillary: 261 mg/dL — ABNORMAL HIGH (ref 65–99)

## 2016-06-17 MED ORDER — POTASSIUM CHLORIDE CRYS ER 20 MEQ PO TBCR
40.0000 meq | EXTENDED_RELEASE_TABLET | Freq: Once | ORAL | Status: AC
  Administered 2016-06-17: 40 meq via ORAL
  Filled 2016-06-17: qty 2

## 2016-06-17 NOTE — Discharge Instructions (Signed)

## 2016-06-17 NOTE — Discharge Summary (Signed)
Physician Discharge Summary  Joshua [name] GYJ:[id number] DOB: 09-10-1940 DOA: 06/15/2016  PCP: Tammi Sou, MD  Admit date: 06/15/2016 Discharge date: 06/17/2016  Admitted From: Home Disposition: Skilled nursing facility [facility] [place])  Recommendations for Outpatient Follow-up:  1. Follow with M.D. at SNF in 1 week. 2. Patient has follow-up in heart failure clinic later this week (6/15 at 10:30 AM)   Equipment/Devices: Oxygen 2.5 L cannula  Discharge Condition: Stable CODE STATUS: Full code Diet recommendation: Heart healthy/diabetic    Discharge Diagnoses:  Principal Problem:   Weakness generalized   Active Problems:   Frequent falls   HTN (hypertension), benign   COPD (chronic obstructive pulmonary disease) (HCC)   PAF (paroxysmal atrial fibrillation) (HCC)   S/P CABG x 4   Diabetes mellitus with complication (HCC)   AKI (acute kidney injury) ([place])   Chronic systolic CHF (congestive heart failure) ([place])  Brief narrative/history of present illness 76 y.o.gentleman with a history of paroxysmal atrial fibrillation (CHADS-Vasc score of at least 3, anticoagulated with Xarelto), CAD S/P CABG, chronic combined systolic and diastolic CHF (EF 26-37% by echo last week), COPD with chronic respiratory failure (oxygen 2.5L [place] at baseline), PAD, Type 2 DM, and HTN who was just admitted from 5/31 - 6/7 for management of acute on chronic respiratory failure secondary to CHF and COPD exacerbations. He diuresed well and was discharged at a weight of 233 lbs. The patient has been taking his medications as prescribed at discharge. Per family, he was demonstrating generalized weakness over the weekend and fell three times in his home. One episode was associated with syncope, but it was unwitnessed because the patient was in his bathroom when it happened. He is not sure how long he was unconscious, but he could not remember falling. The patient was locked in the bathroom and stayed  on the ground until family was able to get in (about one hour). He has had recurrent right hip pain since that fall. No chest pain or shortness of breath. No palpitations. No fever, chills, or sweats. No nausea or vomiting. No swelling. Vitals in the ED were stable. Admission labs unremarkable head CT negative for acute findings. Placed in observation.  Hospital course Generalized weakness with recurrent falls Suspect this is due to prolonged recent hospitalization and dehydration with acute kidney injury from aggressive diuresis. Also suspect orthostatic hypotension at home (was negative on admission). -Diuretic was held. Seen by PT/OT and recommended SNF. Has been stable on the monitor. Denies further weakness. -Have resumed his diuretic upon discharge.  Acute kidney injury Prerenal secondary to aggressive diuresis recently. Diuretics held. Renal function has normalized. UA negative for UTI.  Combined chronic systolic and diastolic CHF Patient euvolemic on admission. Likely was dehydrated causing him to have frequent fall. Diuretic held since admission and will resume upon discharge. Has follow-up in heart failure clinic this week.  Diabetes mellitus type 2 Continue home dose insulin and Actos.  History of CAD status post CABG Continue baby aspirin, beta blocker and statin.  Paroxysmal A. fib Continue Xarelto. Since patient's fall symptoms are new and likely is related to dehydration we'll continue anticoagulation and if this becomes more frequent and will need to weigh risks versus benefit of anticoagulation.  COPD with chronic respiratory failure On 2.5 L [place] at home. Continue delayed.   Procedures: None  Family communication: Wife at bedside Disposition: Home  Discharge Instructions   Allergies as of 06/17/2016      Reactions   Morphine And Related  Other (See Comments)   Drenched with perspiration   Demerol [meperidine] Nausea Only   Starlix [nateglinide] Other (See  Comments)   gassy      Medication List    TAKE these medications   acetaminophen 325 MG tablet Commonly known as:  TYLENOL Take 2 tablets (650 mg total) by mouth every 4 (four) hours as needed for headache or mild pain.   aspirin 81 MG tablet Take 81 mg by mouth at bedtime. Reported on 02/26/2015   atorvastatin 40 MG tablet Commonly known as:  LIPITOR TAKE ONE TABLET BY MOUTH EVERY DAY   bisoprolol 5 MG tablet Commonly known as:  ZEBETA Take 1 tablet (5 mg total) by mouth daily.   budesonide-formoterol 160-4.5 MCG/ACT inhaler Commonly known as:  SYMBICORT Inhale 2 puffs into the lungs 2 (two) times daily.   buPROPion 150 MG 12 hr tablet Commonly known as:  WELLBUTRIN SR TAKE ONE TABLET BY MOUTH TWICE DAILY   Chromium Picolinate 1000 MCG Tabs Take 1,000 mcg by mouth daily. Reported on 02/26/2015   citalopram 20 MG tablet Commonly known as:  CELEXA Take 1 tablet (20 mg total) by mouth daily.   digoxin 0.125 MG tablet Commonly known as:  LANOXIN Take 1 tablet (0.125 mg total) by mouth daily.   fluticasone 50 MCG/ACT nasal spray Commonly known as:  FLONASE Place 2 sprays into both nostrils daily.   gabapentin 100 MG capsule Commonly known as:  NEURONTIN TAKE 2 CAPSULES BY MOUTH 3 TIMES DAILY What changed:  See the new instructions.   guaiFENesin 600 MG 12 hr tablet Commonly known as:  MUCINEX Take 2 tablets (1,200 mg total) by mouth 2 (two) times daily.   Insulin Glargine 300 UNIT/ML Sopn Commonly known as:  TOUJEO SOLOSTAR Inject 70 Units into the skin daily. What changed:  how much to take   insulin regular 100 units/mL injection Commonly known as:  NOVOLIN R,HUMULIN R Inject 16 units SQ with BF, 17 units SQ with Lunch, and 18 U SQ with Supper What changed:  additional instructions   isosorbide mononitrate 30 MG 24 hr tablet Commonly known as:  IMDUR TAKE ONE TABLET BY MOUTH DAILY   mometasone-formoterol 100-5 MCG/ACT Aero Commonly known as:   DULERA Inhale 2 puffs into the lungs 2 (two) times daily.   multivitamin with minerals Tabs tablet Take 1 tablet by mouth daily.   nitroGLYCERIN 0.4 MG SL tablet Commonly known as:  NITROSTAT Place 1 tablet (0.4 mg total) under the tongue every 5 (five) minutes as needed for chest pain.   potassium chloride SA 20 MEQ tablet Commonly known as:  K-DUR,KLOR-CON Take 1 tablet (20 mEq total) by mouth daily.   PROAIR HFA 108 (90 Base) MCG/ACT inhaler Generic drug:  albuterol INHALE 2 PUFFS INTO THE LUNGS EVERY 4 HOURS AS NEEDED FOR WHEEZING ORSHORTNESS OF BREATH   rivaroxaban 20 MG Tabs tablet Commonly known as:  XARELTO Take 1 tablet (20 mg total) by mouth daily with supper.   spironolactone 25 MG tablet Commonly known as:  ALDACTONE Take 1 tablet (25 mg total) by mouth daily.   SYNJARDY 05-998 MG Tabs Generic drug:  Empagliflozin-Metformin HCl TAKE ONE TABLET BY MOUTH TWICE DAILY AFTER A MEAL   tamsulosin 0.4 MG Caps capsule Commonly known as:  FLOMAX TAKE 1 CAPSULE BY MOUTH EVERY DAY AFTER SUPPER   torsemide 20 MG tablet Commonly known as:  DEMADEX Take 2 tablets (40 mg total) by mouth daily.   vitamin C 1000 MG  tablet Take 1,000 mg by mouth daily.   Vitamin D-3 1000 units Caps Take 1,000 Units by mouth daily. Reported on 02/26/2015       Contact information for follow-up providers    heart failure clinic Follow up on 06/19/2016.   Why:  at 10:30 am           Contact information for after-discharge care    Destination    [facility].   Specialty:  Skilled Nursing Facility Contact information: [address] [place] [phone]                 Allergies  Allergen Reactions  . Morphine And Related Other (See Comments)    Drenched with perspiration  . Demerol [Meperidine] Nausea Only  . Starlix [Nateglinide] Other (See Comments)    gassy     Procedures/Studies: Dg Chest 2 View  Result  Date: 06/16/2016 CLINICAL DATA:  Shortness of breath. EXAM: CHEST  2 VIEW COMPARISON:  Radiograph of June 08, 2016. FINDINGS: Stable cardiomegaly with mild central pulmonary vascular congestion is noted. Status post coronary artery bypass graft. No pneumothorax or pleural effusion is noted. Multilevel degenerative disc disease is noted in the thoracic spine. Decreased bibasilar and perihilar opacities are noted suggesting improving edema. Mild bibasilar subsegmental atelectasis is noted. IMPRESSION: Mild bibasilar subsegmental atelectasis. Improved pulmonary edema compared to prior exam. Aortic atherosclerosis. Electronically Signed   By: Marijo Conception, M.D.   On: 06/16/2016 13:09   Ct Head Wo Contrast  Result Date: 06/15/2016 CLINICAL DATA:  Status post falls, with right leg weakness. Initial encounter. EXAM: CT HEAD WITHOUT CONTRAST TECHNIQUE: Contiguous axial images were obtained from the base of the skull through the vertex without intravenous contrast. COMPARISON:  CT of the head performed 04/04/2015 FINDINGS: Brain: No evidence of acute infarction, hemorrhage, hydrocephalus, extra-axial collection or mass lesion/mass effect. Prominence of the ventricles and sulci reflects mild cortical volume loss. 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 Mild cerebellar atrophy is noted. The brainstem and fourth ventricle are within normal limits. The cerebral hemispheres demonstrate grossly normal gray-white differentiation. No mass effect or midline shift is seen. Vascular: No hyperdense vessel or unexpected calcification. Skull: There is no evidence of fracture; visualized osseous structures are unremarkable in appearance. Sinuses/Orbits: The orbits are within normal limits. The paranasal sinuses and mastoid air cells are well-aerated. Other: No significant soft tissue abnormalities are seen. IMPRESSION: 1. No evidence of traumatic intracranial injury or fracture. 2. Mild cortical volume loss. 3.  Chronic lacunar infarcts at the right basal ganglia. Electronically Signed   By: Garald Balding M.D.   On: 06/15/2016 21:23   Ct Chest High Resolution  Result Date: 06/02/2016 CLINICAL DATA:  History of asbestos exposure. Follow-up pulmonary nodule. EXAM: CT CHEST WITHOUT CONTRAST TECHNIQUE: Multidetector CT imaging of the chest was performed following the standard protocol without intravenous contrast. High resolution imaging of the lungs, as well as inspiratory and expiratory imaging, was performed. COMPARISON:  07/02/2013 chest CT.  05/18/2016 chest radiograph. FINDINGS: Cardiovascular: Top-normal heart size. No significant pericardial fluid/thickening. Left main, left anterior descending, left circumflex and right coronary atherosclerosis status post CABG. Atherosclerotic nonaneurysmal thoracic aorta. Stable dilated main pulmonary artery (3.7 cm diameter). Mediastinum/Nodes: Dominant hypodense 1.4 cm left thyroid lobe nodule. Unremarkable esophagus. No axillary adenopathy. Stable mildly enlarged 1.0 cm subcarinal node (series 2/ image 64). No additional pathologically enlarged mediastinal or gross hilar nodes on this noncontrast scan.  Lungs/Pleura: No pneumothorax. Numerous stable calcified bilateral pleural plaques. Small right and trace left dependent pleural effusions. The previously described apical right upper lobe ground-glass pulmonary nodule on 07/02/2013 chest CT is absent on today's scan. There is new mild patchy peribronchovascular consolidation in the right upper lobe. There is mild patchy ground-glass attenuation in both lungs. There is patchy subpleural reticulation throughout both lungs, increased in the interval. There are several scattered parenchymal bands throughout both lungs, most prominent in the lower lobes. No significant air trapping on the expiration sequence. No significant regions of traction bronchiectasis or frank honeycombing. Upper abdomen: Cholecystectomy. Musculoskeletal: No  aggressive appearing focal osseous lesions. Marked thoracic spondylosis. Sternotomy wires appear intact. IMPRESSION: 1. Previously described apical right upper lobe ground-glass pulmonary nodule on 07/02/2013 chest CT is absent on today's scan, compatible with a resolved inflammatory nodule . 2. New mild patchy peribronchovascular consolidation in the right upper lobe. Mild patchy ground-glass attenuation in both lungs. These findings are favored to represent a mild bronchopneumonia. 3. Bilateral calcified pleural plaques. Small right and trace left dependent pleural effusions. 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 4. Mild patchy subpleural reticulation and parenchymal banding throughout both lungs, mildly increased in the interval, compatible with asbestosis. No significant traction bronchiectasis or frank honeycombing. 5. Aortic atherosclerosis. Left main and 3 vessel coronary atherosclerosis status post CABG. 6. Stable dilated main pulmonary artery, suggesting chronic pulmonary arterial hypertension. 7. Stable mild mediastinal lymphadenopathy, compatible with benign reactive adenopathy. These results will be called to the ordering clinician or representative by the Radiologist Assistant, and communication documented in the PACS or zVision Dashboard. Electronically Signed   By: Ilona Sorrel M.D.   On: 06/02/2016 09:15   Dg Chest Port 1 View  Result Date: 06/08/2016 CLINICAL DATA:  Congestive heart failure EXAM: PORTABLE CHEST 1 VIEW COMPARISON:  June 06, 2016 FINDINGS: Central catheter tip is at the cavoatrial junction. No pneumothorax. There are small pleural effusions bilaterally. There is interstitial edema. There is cardiomegaly with pulmonary venous hypertension. There is mild bibasilar atelectasis. There is patchy airspace consolidation in the left base. No evident bone lesions. No adenopathy. IMPRESSION: Evidence of congestive heart failure. Mild bibasilar atelectasis.  Question alveolar edema versus patchy pneumonia left base. No pneumothorax. There appears to be slightly less edema compared to most recent study. Electronically Signed   By: Lowella Grip III M.D.   On: 06/08/2016 07:12   Dg Chest Port 1 View  Result Date: 06/06/2016 CLINICAL DATA:  Confirm PICC line placement. EXAM: PORTABLE CHEST 1 VIEW COMPARISON:  Chest x-ray dated 06/05/2016. FINDINGS: Right-sided PICC line has been placed with tip appropriately positioned at the level of the mid/lower SVC. Cardiomegaly is stable. Median sternotomy wires in place. Atherosclerotic changes again noted at the aortic arch. Again noted is central pulmonary vascular congestion and bilateral interstitial edema. Suspect small bilateral pleural effusions with possible adjacent atelectasis. No pneumothorax seen. IMPRESSION: 1. Right-sided PICC line placement with tip appropriately positioned at the level of the mid/lower SVC. 2. Persistent pulmonary vascular congestion and bilateral interstitial edema, suggesting CHF/volume overload. 3. Probable small bilateral pleural effusions and/or atelectasis. 4. Stable cardiomegaly. Electronically Signed   By: Franki Cabot M.D.   On: 06/06/2016 10:54   Dg Chest Port 1 View  Result Date: 06/05/2016 CLINICAL DATA:  Hypoxia. EXAM: PORTABLE CHEST 1 VIEW COMPARISON:  Single-view of the chest 06/04/2016. PA and lateral chest 05/18/2016. CT chest 06/02/2016. FINDINGS: Cardiomegaly and mild interstitial edema do not appear  notably changed since the most recent study. The patient is status post CABG. No pneumothorax or pleural effusion. IMPRESSION: No change in cardiomegaly and mild interstitial edema. Electronically Signed   By: Inge Rise M.D.   On: 06/05/2016 17:19   Dg Chest Portable 1 View  Result Date: 06/04/2016 CLINICAL DATA:  Episode of shortness of breath 2-3 weeks ago without chest pain. One year history of intermittently productive cough. Discontinued smoking 8 months ago.  History of diabetes and COPD. 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 EXAM: PORTABLE CHEST 1 VIEW COMPARISON:  CT scan of the chest of Jun 02, 2016 and chest x-ray of May 18, 2016. FINDINGS: The lungs are adequately inflated. The interstitial markings remain increased air and are slightly more conspicuous overall today. The cardiac silhouette is enlarged today in the pulmonary vascularity is more engorged. The patient has undergone previous CABG. There is calcification in the wall of the aortic arch. There are calcified pleural plaques bilaterally. The observed bony thorax exhibits no acute abnormality. IMPRESSION: Mild interval worsening in the appearance of the pulmonary interstitium which likely reflects CHF superimposed on known chronic lung disease. There is no alveolar pneumonia. Electronically Signed   By: David  Martinique M.D.   On: 06/04/2016 12:51   Dg Hip Unilat W Or Wo Pelvis 1 View Right  Result Date: 06/15/2016 CLINICAL DATA:  Right hip pain after fall on Saturday. Hip replacement in 2015. EXAM: DG HIP (WITH OR WITHOUT PELVIS) 1V RIGHT COMPARISON:  None. FINDINGS: There is no evidence of acute hip fracture or dislocation. The bony pelvis appears intact lower lumbar degenerative disc and facet arthropathy. No pelvic diastasis. The native left hip joint is maintained. Intact uncemented right hip arthroplasty without asymmetric lining wear. Mild heterotopic bone formation is seen about the right hip consistent with postop change. There is no evidence of arthropathy or other focal bone abnormality. IMPRESSION: Intact right uncemented hip arthroplasty with postop change. No acute displaced fracture is seen. Electronically Signed   By: Ashley Royalty M.D.   On: 06/15/2016 21:47   Dg Femur 1 View Right  Result Date: 06/15/2016 CLINICAL DATA:  Patient fell with right-sided lower extremity pain. EXAM: RIGHT FEMUR 1 VIEW COMPARISON:  None. FINDINGS: Overlapping AP views were provided as requested. This  limits assessment for displacement and dislocations in the antero posterior plane. There is an intact uncemented right hip arthroplasty with postop heterotopic new bone formation about the right hip. Common femoral through popliteal calcific atherosclerosis. Vascular clips are noted bilaterally along the medial aspect of the distal thighs. Femorotibial joint space narrowing with chondrocalcinosis is seen about the knee. The visualized pubic rami appear intact. IMPRESSION: Negative for acute fracture or gross malalignment. Electronically Signed   By: Ashley Royalty M.D.   On: 06/15/2016 21:50       Subjective: Patient denies any dizziness, lightheadedness or weakness. Denies any chest pain, palpitations or shortness of breath.  Discharge Exam: Vitals:   06/16/16 2021 06/17/16 0701  BP: 120/61 118/60  Pulse: 60 65  Resp: 17 17  Temp: 98.5 F (36.9 C) 98.3 F (36.8 C)   Vitals:   06/16/16 1940 06/16/16 2021 06/17/16 0701 06/17/16 0827  BP:  120/61 118/60   Pulse:  60 65   Resp:  17 17   Temp:  98.5 F (36.9 C) 98.3 F (36.8 C)   TempSrc:  Oral Oral   SpO2: 96% 97% 98% 97%  Weight:   102.1 kg (225 lb)   Height:  General: Elderly male not in distress HEENT: Moist mucosa, supple neck Chest: Clear bilaterally CVS: S1 and S2 irregular, no murmurs rub or gallop GI: Soft, nondistended, nontender Musculoskeletal: Warm, no edema CNS: Alert and oriented    The results of significant diagnostics from this hospitalization (including imaging, microbiology, ancillary and laboratory) are listed below for reference.     Microbiology: No results found for this or any previous visit (from the past 240 hour(s)).   Labs: BNP (last 3 results)  Recent Labs  10/23/15 1831 06/04/16 1155  BNP 47.7 585.2*   Basic Metabolic Panel:  Recent Labs Lab 06/11/16 0431 06/15/16 1913 06/16/16 0233 06/17/16 0314  NA 138 143 139 137  K 3.4* 3.8 4.5 3.2*  CL 85* 93* 91* 95*  CO2 38* 33*  38* 32  GLUCOSE 98 155* 197* 200*  BUN 57* 62* 59* 43*  CREATININE 1.24 1.54* 1.54* 1.18  CALCIUM 8.9 9.6 8.9 8.4*  MG  --  2.3  --   --    Liver Function Tests:  Recent Labs Lab 06/15/16 1913  AST 27  ALT 32  ALKPHOS 82  BILITOT 0.9  PROT 6.3*  ALBUMIN 3.3*   No results for input(s): LIPASE, AMYLASE in the last 168 hours. No results for input(s): AMMONIA in the last 168 hours. CBC:  Recent Labs Lab 06/11/16 0431 06/15/16 1913 06/16/16 0233 06/16/16 1406  WBC 11.9* 14.0* 11.5*  --   NEUTROABS  --  10.1*  --   --   HGB 13.6 14.2 13.2 13.7  HCT 45.5 46.0 45.0 45.6  MCV 84.7 84.2 85.9  --   PLT 253 225 183  --    Cardiac Enzymes:  Recent Labs Lab 06/16/16 0233  CKTOTAL 47*   BNP: Invalid input(s): POCBNP CBG:  Recent Labs Lab 06/16/16 1554 06/16/16 2245 06/16/16 2306 06/17/16 0741 06/17/16 1134  GLUCAP 231* 238* 220* 186* 261*   D-Dimer No results for input(s): DDIMER in the last 72 hours. Hgb A1c No results for input(s): HGBA1C in the last 72 hours. Lipid Profile No results for input(s): CHOL, HDL, LDLCALC, TRIG, CHOLHDL, LDLDIRECT in the last 72 hours. Thyroid function studies No results for input(s): TSH, T4TOTAL, T3FREE, THYROIDAB in the last 72 hours.  Invalid input(s): FREET3 Anemia work up No results for input(s): VITAMINB12, FOLATE, FERRITIN, TIBC, IRON, RETICCTPCT in the last 72 hours. Urinalysis    Component Value Date/Time   COLORURINE YELLOW 06/15/2016 2300   APPEARANCEUR CLEAR 06/15/2016 2300   LABSPEC 1.010 06/15/2016 2300   PHURINE 5.0 06/15/2016 2300   GLUCOSEU >=500 (A) 06/15/2016 2300   HGBUR NEGATIVE 06/15/2016 2300   BILIRUBINUR NEGATIVE 06/15/2016 2300   KETONESUR NEGATIVE 06/15/2016 2300   PROTEINUR NEGATIVE 06/15/2016 2300   UROBILINOGEN 0.2 08/11/2013 1624   NITRITE NEGATIVE 06/15/2016 2300   LEUKOCYTESUR NEGATIVE 06/15/2016 2300   Sepsis Labs Invalid input(s): PROCALCITONIN,  WBC,  LACTICIDVEN Microbiology No  results found for this or any previous visit (from the past 240 hour(s)).   Time coordinating discharge: <30 minutes  SIGNED:   Louellen Molder, MD  [facility] 06/17/2016, 12:02 PM Pager   If 7PM-7AM, please contact night-coverage [email or web address] Password [email or web address]

## 2016-06-17 NOTE — Clinical Social Work Placement (Signed)
   CLINICAL SOCIAL WORK PLACEMENT  NOTE  Date:  06/17/2016  Patient Details  Name: Joshua [name] MRN: [id number] Date of Birth: [birth year]  Clinical Social Work is seeking post-discharge placement for this patient at the [place] level of care (*CSW will initial, date and re-position this form in  chart as items are completed):  Yes   Patient/family provided with [facility] Work Department's list of facilities offering this level of care within the geographic area requested by the patient (or if unable, by the patient's family).  Yes   Patient/family informed of their freedom to choose among providers that offer the needed level of care, that participate in Medicare, Medicaid or managed care program needed by the patient, have an available bed and are willing to accept the patient.  Yes   Patient/family informed of [facility]'s ownership interest in [facility] and [facility], as well as of the fact that they are under no obligation to receive care at these facilities.  PASRR submitted to EDS on 06/17/16     PASRR number received on       Existing PASRR number confirmed on 06/17/16     FL2 transmitted to all facilities in geographic area requested by pt/family on 06/17/16     FL2 transmitted to all facilities within larger geographic area on       Patient informed that his/her managed care company has contracts with or will negotiate with certain facilities, including the following:        Yes   Patient/family informed of bed offers received.  Patient chooses bed at [facility]     Physician recommends and patient chooses bed at      Patient to be transferred to [facility] on 06/17/16.  Patient to be transferred to facility by [facility]     Patient family notified on 06/17/16 of transfer.  Name of family member notified:  [name]     PHYSICIAN Please prepare prescriptions     Additional Comment:     _______________________________________________ Candie Chroman, LCSW 06/17/2016, 12:27 PM

## 2016-06-17 NOTE — Clinical Social Work Note (Signed)
Patient has a bed at [facility] today. CSW paged MD to notify.  Joshua [name], Joshua [name]

## 2016-06-17 NOTE — NC FL2 (Signed)
[place] LEVEL OF CARE SCREENING TOOL     IDENTIFICATION  Patient Name: Joshua [name] Birthdate: 08/04/1940 Sex: male Admission Date (Current Location): 06/15/2016  [facility] and [place] Number:  Herbalist and Address:  [facility] [place], [place] [address] [place], [place] [address]      Provider Number: [id number]  Attending Physician Name and Address:  Louellen Molder, MD  Relative Name and Phone Number:       Current Level of Care: Hospital Recommended Level of Care: [place] Prior Approval Number:    Date Approved/Denied:   PASRR Number: [id number]  Discharge Plan: SNF    Current Diagnoses: Patient Active Problem List   Diagnosis Date Noted  . Fall 06/16/2016  . Weakness 06/15/2016  . CHF (congestive heart failure) ([place])   . AKI (acute kidney injury) ([place])   . Respiratory distress 06/04/2016  . Acute respiratory distress 06/04/2016  . Bronchopneumonia 06/04/2016  . Tachycardia 06/04/2016  . Hypoxia   . Chronic diastolic CHF (congestive heart failure) ([place]) 02/18/2015  . Acute upper respiratory infection 02/06/2015  . Facial cellulitis 11/28/2014  . Abscess of nasal cavity 11/28/2014  . Cellulitis and abscess of neck 11/28/2014  . Acute on chronic diastolic CHF (congestive heart failure) ([place]) 11/12/2014  . Dyspnea 10/29/2014  . Cough 10/29/2014  . History of asbestosis 10/29/2014  . Low oxygen saturation 10/29/2014  . Respiratory failure with hypoxia ([place]) 10/29/2014  . COPD exacerbation ([place]) 10/29/2014  . Acute on chronic respiratory failure with hypoxia ([place])   . Diabetes ([place]) 06/18/2014  . Depression 06/11/2014  . Diabetes mellitus with complication ([place]) 95/18/8416  . Postoperative anemia due to acute blood loss 10/12/2013  . 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 Coronary artery disease- 3 V at cath 08/08/13 08/08/2013  . DOE (dyspnea on exertion)   . Acute on chronic respiratory failure ([place])  07/02/2013  . PAF (paroxysmal atrial fibrillation) ([place]) 07/02/2013  . Acute bronchitis 07/01/2013  . Pleural effusion with elevated pro BNP 07/01/2013  . Pericardial effusion 07/01/2013  . Pericardial effusion with cardiac tamponade- s/p perciocentesis 06/30/13   . ACS (acute coronary syndrome) ([place]) 06/30/2013  . Fatigue 06/30/2013  . Nonspecific abnormal electrocardiogram (ECG) (EKG) 06/30/2013  . COPD with exacerbation ([place]) 06/30/2013  . Abnormal chest x-ray 10/03/2012  . 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 Arthritis   . COPD (chronic obstructive pulmonary disease) ([place])   . CKD (chronic kidney disease), stage I   . Obesity   . Degenerative arthritis of hip 08/12/2012  . Preoperative clearance 07/07/2012  . Hyperlipidemia 03/16/2012  . Tobacco dependence   . Health maintenance examination 09/16/2011  . HTN (hypertension), benign 09/02/2011  . Prostate cancer screening 09/02/2011    Orientation RESPIRATION BLADDER Height & Weight     Self, Time, Situation, Place  O2 (Nasal Canula 2 L) Incontinent, External catheter Weight: 225 lb (102.1 kg) Height:  5' 11.5" (181.6 cm)  BEHAVIORAL SYMPTOMS/MOOD NEUROLOGICAL BOWEL NUTRITION STATUS   (None)  (None) Continent Diet (Heart healthy/carb modified. Fluid restriction: 1500 mL.)  AMBULATORY STATUS COMMUNICATION OF NEEDS Skin   Limited Assist Verbally Skin abrasions, Bruising                       Personal Care Assistance Level of Assistance  Bathing, Feeding, Dressing Bathing Assistance: Limited assistance Feeding assistance: Independent Dressing Assistance: Limited assistance     Functional Limitations Info  Sight, Hearing,  Speech Sight Info: Adequate Hearing Info: Adequate Speech Info: Adequate    SPECIAL CARE FACTORS FREQUENCY  PT (By licensed PT), Blood pressure, OT (By licensed OT)     PT Frequency: 5 x week OT Frequency: 5 x week            Contractures Contractures Info: Not present     Additional Factors Info  Code Status, Allergies, Isolation Precautions, Psychotropic Code Status Info: Full Allergies Info: Morphine and related, Demerol (Meperidine), Starlix (Nateglinide) Psychotropic Info: Depression: Wellbutrin SR 150 mg PO BID with meals, Celexa 20 mg PO daily   Isolation Precautions Info: Contact: MRSA     Current Medications (06/17/2016):  This is the current hospital active medication list Current Facility-Administered Medications  Medication Dose Route Frequency Provider Last Rate Last Dose  . acetaminophen (TYLENOL) tablet 650 mg  650 mg Oral Q6H PRN Lily Kocher, MD       Or  . acetaminophen (TYLENOL) suppository 650 mg  650 mg Rectal Q6H PRN Lily Kocher, MD      . acetaminophen (TYLENOL) tablet 650 mg  650 mg Oral Q6H PRN Lily Kocher, MD      . aspirin EC tablet 81 mg  81 mg Oral QHS Lily Kocher, MD   81 mg at 06/16/16 2242  . atorvastatin (LIPITOR) tablet 40 mg  40 mg Oral Daily Lily Kocher, MD   40 mg at 06/17/16 0939  . bisoprolol (ZEBETA) tablet 5 mg  5 mg Oral Daily Lily Kocher, MD   5 mg at 06/17/16 0939  . buPROPion [facility] SR) 12 hr tablet 150 mg  150 mg Oral BID WC Lily Kocher, MD   150 mg at 06/17/16 4098  . citalopram (CELEXA) tablet 20 mg  20 mg Oral Daily Lily Kocher, MD   20 mg at 06/17/16 0939  . digoxin (LANOXIN) tablet 0.125 mg  0.125 mg Oral Daily Lily Kocher, MD   0.125 mg at 06/17/16 1191  . gabapentin (NEURONTIN) capsule 200 mg  200 mg Oral BID Lily Kocher, MD   200 mg at 06/17/16 0939  . insulin aspart (novoLOG) injection 0-15 Units  0-15 Units Subcutaneous TID WC Lily Kocher, MD   3 Units at 06/17/16 0940  . insulin aspart (novoLOG) injection 0-5 Units  0-5 Units Subcutaneous QHS Lily Kocher, MD   2 Units at 06/16/16 2245  . insulin glargine (LANTUS) injection 60 Units  60 Units Subcutaneous Daily Lily Kocher, MD   60 Units at 06/17/16 0940  . ipratropium-albuterol (DUONEB) 0.5-2.5 (3) MG/3ML nebulizer  solution 3 mL  3 mL Nebulization Q4H PRN Lily Kocher, MD      . isosorbide mononitrate (IMDUR) 24 hr tablet 30 mg  30 mg Oral Daily Lily Kocher, MD   30 mg at 06/17/16 0939  . mometasone-formoterol (DULERA) 100-5 MCG/ACT inhaler 2 puff  2 puff Inhalation BID Lily Kocher, MD   2 puff at 06/17/16 0827  . ondansetron (ZOFRAN) tablet 4 mg  4 mg Oral Q6H PRN Lily Kocher, MD       Or  . ondansetron [facility]) injection 4 mg  4 mg Intravenous Q6H PRN Lily Kocher, MD      . potassium chloride SA (K-DUR,KLOR-CON) CR tablet 20 mEq  20 mEq Oral Daily Lily Kocher, MD   20 mEq at 06/17/16 0939  . rivaroxaban (XARELTO) tablet 20 mg  20 mg Oral Q supper Lily Kocher, MD   20 mg at 06/16/16 1734  . sodium chloride flush (NS) 0.9 %  injection 3 mL  3 mL Intravenous Q12H Lily Kocher, MD   3 mL at 06/16/16 2200  . spironolactone (ALDACTONE) tablet 25 mg  25 mg Oral Daily Lily Kocher, MD   25 mg at 06/17/16 0939  . tamsulosin (FLOMAX) capsule 0.4 mg  0.4 mg Oral QPC supper Lily Kocher, MD   0.4 mg at 06/16/16 1735  . torsemide (DEMADEX) tablet 40 mg  40 mg Oral Daily Lily Kocher, MD   40 mg at 06/17/16 8144     Discharge Medications: Please see discharge summary for a list of discharge medications.  Relevant Imaging Results:  Relevant Lab Results:   Additional Information SS#: [id number]  Candie Chroman, LCSW

## 2016-06-17 NOTE — Clinical Social Work Note (Signed)
Clinical Social Work Assessment  Patient Details  Name: Joshua [name] MRN: [id number] Date of Birth: 1940-02-21  Date of referral:  06/17/16               Reason for consult:  Facility Placement, Discharge Planning                Permission sought to share information with:  Facility Sport and exercise psychologist, Family Supports Permission granted to share information::  Yes, Verbal Permission Granted  Name::     [name] Dougal  Agency::  [employer]  Relationship::  Spouse  Contact Information:  [phone]  Housing/Transportation Living arrangements for the past 2 months:  [place] of Information:  Patient, Medical Team, Spouse Patient Interpreter Needed:  None Criminal Activity/Legal Involvement Pertinent to Current Situation/Hospitalization:  No - Comment as needed Significant Relationships:  Spouse, Adult Children Lives with:  Spouse Do you feel safe going back to the place where you live?  Yes Need for family participation in patient care:  Yes (Comment)  Care giving concerns:  PT recommending SNF once medically stable for discharge.   Social Worker assessment / plan:  CSW met with patient. Spouse at bedside. CSW introduced role and explained that PT recommendations would be discussed. Patient and his wife are agreeable to SNF placement at [facility] and this is the only facility they will go to. If [facility] is unable to take the patient, they want to return home with HHPT. CSW sent referral to facility and notified admissions coordinator. She will review information with a nurse and call CSW back with decision. No further concerns. CSW encouraged patient and his wife to contact CSW as needed. CSW will continue to follow patient and his wife for support and facilitate discharge to SNF, if bed available, once medically stable.  Employment status:  Retired Nurse, adult PT Recommendations:  [place] / Referral to community resources:  [place]  Patient/Family's Response to care:  Patient and his wife agreeable to SNF placement. Patient's family supportive and involved in patient's care. Patient and his wife appreciated social work intervention.  Patient/Family's Understanding of and Emotional Response to Diagnosis, Current Treatment, and Prognosis:  Patient and his wife have a good understanding of the reason for admission. Patient and his wife appear happy with hospital care.  Emotional Assessment Appearance:  Appears stated age Attitude/Demeanor/Rapport:  Other (Pleasant) Affect (typically observed):  Accepting, Appropriate, Calm, Pleasant Orientation:  Oriented to Self, Oriented to Place, Oriented to  Time, Oriented to Situation Alcohol / Substance use:  Tobacco Use Psych involvement (Current and /or in the community):  No (Comment)  Discharge Needs  Concerns to be addressed:  Care Coordination Readmission within the last 30 days:  Yes Current discharge risk:  Dependent with Mobility Barriers to Discharge:  No Barriers Identified   Candie Chroman, LCSW 06/17/2016, 10:59 AM

## 2016-06-17 NOTE — Progress Notes (Signed)
Pt is alert and oriented with wife at the bedside,  Stable denies dizziness,plan to have SW find a place for rehab.

## 2016-06-17 NOTE — Clinical Social Work Note (Signed)
CSW facilitated patient discharge including contacting patient family and facility to confirm patient discharge plans. Clinical information faxed to facility and family agreeable with plan. CSW arranged ambulance transport via [facility] to [facility] around 1:15 pm as patient is currently working with PT. RN to call report prior to discharge [phone]).  CSW will sign off for now as social work intervention is no longer needed. Please consult [place] again if new needs arise.  Dayton Scrape, [place]

## 2016-06-17 NOTE — Consult Note (Signed)
   [facility] CM Inpatient Consult   06/17/2016  BRANSEN FASSNACHT [birth year] [phone]  Fllow up:  Met with the patient and his wife.  Patient is currently going to a skilled facility for rehab.  [facility] Care Management will follow for facility discharge planning with [place] worker for transition needs to return home.  For questions, please contact:  Natividad Brood, RN BSN [facility] Liaison  [phone] business mobile phone Toll free office [phone]

## 2016-06-17 NOTE — Progress Notes (Signed)
Physical Therapy Treatment Patient Details Name: Joshua [name] MRN: [id number] DOB: 1940/10/12 Today's Date: 06/17/2016    History of Present Illness 76 yo male admitted with R groin hematoma, s/p fall with R hip pain. CT neg    PT Comments    Patient is making progress toward mobility goals. Pt continues to demo decreased activity tolerance and unsteadiness with gait. Continue to progress as tolerated with anticipated d/c to SNF for further skilled PT services.    Follow Up Recommendations  SNF     Equipment Recommendations  None recommended by PT    Recommendations for Other Services       Precautions / Restrictions Precautions Precautions: Fall Precaution Comments: reports 3 falls at night time ; requires O2 baseline    Mobility  Bed Mobility Overal bed mobility: Needs Assistance Bed Mobility: Supine to Sit     Supine to sit: Min assist     General bed mobility comments: assist to elevate trunk into sitting; use of bed rail  Transfers Overall transfer level: Needs assistance Equipment used: Rolling walker (2 wheeled) Transfers: Sit to/from Stand Sit to Stand: Min assist         General transfer comment: cues for hand placement; pt uses momentum to power up into standing  Ambulation/Gait Ambulation/Gait assistance: Min assist Ambulation Distance (Feet): 100 Feet Assistive device: Rolling walker (2 wheeled) Gait Pattern/deviations: Step-through pattern;Decreased stride length;Decreased dorsiflexion - right;Decreased dorsiflexion - left;Trunk flexed;Wide base of support Gait velocity: decreased   General Gait Details: cues for posture and proximity of RW; pt maintains rigid LE when ambulating; LOB X1    Stairs            Wheelchair Mobility    Modified Rankin (Stroke Patients Only)       Balance Overall balance assessment: Needs assistance Sitting-balance support: Feet supported Sitting balance-Leahy Scale: Good     Standing balance  support: Bilateral upper extremity supported Standing balance-Leahy Scale: Poor                              Cognition Arousal/Alertness: Awake/alert Behavior During Therapy: WFL for tasks assessed/performed Overall Cognitive Status: Within Functional Limits for tasks assessed                                        Exercises      General Comments General comments (skin integrity, edema, etc.): pt on 2.5-3L O2 via [place]; SpO2 95% throughout session      Pertinent Vitals/Pain Pain Assessment: No/denies pain    Home Living                      Prior Function            PT Goals (current goals can now be found in the care plan section) Acute Rehab PT Goals PT Goal Formulation: With patient Time For Goal Achievement: 06/30/16 Potential to Achieve Goals: Good Progress towards PT goals: Progressing toward goals    Frequency    Min 3X/week      PT Plan Current plan remains appropriate    Co-evaluation              AM-PAC PT "6 Clicks" Daily Activity  Outcome Measure  Difficulty turning over in bed (including adjusting bedclothes, sheets and blankets)?: Total Difficulty moving from lying on  back to sitting on the side of the bed? : Total Difficulty sitting down on and standing up from a chair with arms (e.g., wheelchair, bedside commode, etc,.)?: Total Help needed moving to and from a bed to chair (including a wheelchair)?: A Little Help needed walking in hospital room?: A Little Help needed climbing 3-5 steps with a railing? : A Lot 6 Click Score: 11    End of Session Equipment Utilized During Treatment: Gait belt;Oxygen Activity Tolerance: Patient tolerated treatment well Patient left: with call bell/phone within reach;in bed;with bed alarm set;with family/visitor present Nurse Communication: Mobility status PT Visit Diagnosis: Repeated falls (R29.6);Muscle weakness (generalized) (M62.81);Difficulty in walking, not  elsewhere classified (R26.2)     Time: 1210-1230 PT Time Calculation (min) (ACUTE ONLY): 20 min  Charges:  $Gait Training: 8-22 mins                    G Codes:       [name], PTA Pager: [phone]     [name] 06/17/2016, 3:14 PM

## 2016-06-17 NOTE — Clinical Social Work Placement (Signed)
   CLINICAL SOCIAL WORK PLACEMENT  NOTE  Date:  06/17/2016  Patient Details  Name: Joshua [name] MRN: [id number] Date of Birth: 11/14/1940  Clinical Social Work is seeking post-discharge placement for this patient at the [place] level of care (*CSW will initial, date and re-position this form in  chart as items are completed):  Yes   Patient/family provided with [place] Work Department's list of facilities offering this level of care within the geographic area requested by the patient (or if unable, by the patient's family).  Yes   Patient/family informed of their freedom to choose among providers that offer the needed level of care, that participate in Medicare, Medicaid or managed care program needed by the patient, have an available bed and are willing to accept the patient.  Yes   Patient/family informed of [facility]'s ownership interest in [facility] and [facility], as well as of the fact that they are under no obligation to receive care at these facilities.  PASRR submitted to EDS on 06/17/16     PASRR number received on       Existing PASRR number confirmed on 06/17/16     FL2 transmitted to all facilities in geographic area requested by pt/family on 06/17/16     FL2 transmitted to all facilities within larger geographic area on       Patient informed that his/her managed care company has contracts with or will negotiate with certain facilities, including the following:            Patient/family informed of bed offers received.  Patient chooses bed at       Physician recommends and patient chooses bed at      Patient to be transferred to   on  .  Patient to be transferred to facility by       Patient family notified on   of transfer.  Name of family member notified:        PHYSICIAN Please sign FL2     Additional Comment:    _______________________________________________ Candie Chroman, LCSW 06/17/2016, 11:02  AM

## 2016-06-18 DIAGNOSIS — I480 Paroxysmal atrial fibrillation: Secondary | ICD-10-CM | POA: Diagnosis not present

## 2016-06-18 DIAGNOSIS — I5042 Chronic combined systolic (congestive) and diastolic (congestive) heart failure: Secondary | ICD-10-CM | POA: Diagnosis not present

## 2016-06-18 DIAGNOSIS — J449 Chronic obstructive pulmonary disease, unspecified: Secondary | ICD-10-CM | POA: Diagnosis not present

## 2016-06-18 DIAGNOSIS — E119 Type 2 diabetes mellitus without complications: Secondary | ICD-10-CM | POA: Diagnosis not present

## 2016-06-19 ENCOUNTER — Ambulatory Visit: Payer: Self-pay | Admitting: *Deleted

## 2016-06-19 ENCOUNTER — Telehealth (HOSPITAL_COMMUNITY): Payer: Self-pay | Admitting: Pharmacist

## 2016-06-19 ENCOUNTER — Telehealth (HOSPITAL_COMMUNITY): Payer: Self-pay

## 2016-06-19 ENCOUNTER — Ambulatory Visit (HOSPITAL_COMMUNITY)
Admission: RE | Admit: 2016-06-19 | Discharge: 2016-06-19 | Disposition: A | Discharge status: Home / Self Care | Payer: Medicare Other | Source: Ambulatory Visit | Attending: Internal Medicine | Admitting: Internal Medicine

## 2016-06-19 VITALS — BP 124/56 | HR 58 | Wt 235.4 lb

## 2016-06-19 DIAGNOSIS — I5042 Chronic combined systolic (congestive) and diastolic (congestive) heart failure: Secondary | ICD-10-CM | POA: Diagnosis not present

## 2016-06-19 DIAGNOSIS — J449 Chronic obstructive pulmonary disease, unspecified: Secondary | ICD-10-CM | POA: Diagnosis not present

## 2016-06-19 DIAGNOSIS — E785 Hyperlipidemia, unspecified: Secondary | ICD-10-CM | POA: Insufficient documentation

## 2016-06-19 DIAGNOSIS — Z7982 Long term (current) use of aspirin: Secondary | ICD-10-CM | POA: Insufficient documentation

## 2016-06-19 DIAGNOSIS — E669 Obesity, unspecified: Secondary | ICD-10-CM | POA: Insufficient documentation

## 2016-06-19 DIAGNOSIS — Z9981 Dependence on supplemental oxygen: Secondary | ICD-10-CM | POA: Diagnosis not present

## 2016-06-19 DIAGNOSIS — H3530 Unspecified macular degeneration: Secondary | ICD-10-CM | POA: Insufficient documentation

## 2016-06-19 DIAGNOSIS — I130 Hypertensive heart and chronic kidney disease with heart failure and stage 1 through stage 4 chronic kidney disease, or unspecified chronic kidney disease: Secondary | ICD-10-CM | POA: Insufficient documentation

## 2016-06-19 DIAGNOSIS — Z794 Long term (current) use of insulin: Secondary | ICD-10-CM | POA: Diagnosis not present

## 2016-06-19 DIAGNOSIS — R296 Repeated falls: Secondary | ICD-10-CM | POA: Diagnosis not present

## 2016-06-19 DIAGNOSIS — N182 Chronic kidney disease, stage 2 (mild): Secondary | ICD-10-CM | POA: Insufficient documentation

## 2016-06-19 DIAGNOSIS — J9611 Chronic respiratory failure with hypoxia: Secondary | ICD-10-CM | POA: Diagnosis not present

## 2016-06-19 DIAGNOSIS — I10 Essential (primary) hypertension: Secondary | ICD-10-CM | POA: Diagnosis not present

## 2016-06-19 DIAGNOSIS — Z79899 Other long term (current) drug therapy: Secondary | ICD-10-CM | POA: Insufficient documentation

## 2016-06-19 DIAGNOSIS — I480 Paroxysmal atrial fibrillation: Secondary | ICD-10-CM | POA: Insufficient documentation

## 2016-06-19 DIAGNOSIS — Z951 Presence of aortocoronary bypass graft: Secondary | ICD-10-CM | POA: Insufficient documentation

## 2016-06-19 DIAGNOSIS — R531 Weakness: Secondary | ICD-10-CM | POA: Insufficient documentation

## 2016-06-19 DIAGNOSIS — R55 Syncope and collapse: Secondary | ICD-10-CM | POA: Insufficient documentation

## 2016-06-19 DIAGNOSIS — Z6832 Body mass index (BMI) 32.0-32.9, adult: Secondary | ICD-10-CM | POA: Diagnosis not present

## 2016-06-19 DIAGNOSIS — N179 Acute kidney failure, unspecified: Secondary | ICD-10-CM

## 2016-06-19 DIAGNOSIS — I5032 Chronic diastolic (congestive) heart failure: Secondary | ICD-10-CM

## 2016-06-19 DIAGNOSIS — E1122 Type 2 diabetes mellitus with diabetic chronic kidney disease: Secondary | ICD-10-CM | POA: Diagnosis not present

## 2016-06-19 DIAGNOSIS — I2510 Atherosclerotic heart disease of native coronary artery without angina pectoris: Secondary | ICD-10-CM | POA: Diagnosis not present

## 2016-06-19 DIAGNOSIS — J438 Other emphysema: Secondary | ICD-10-CM

## 2016-06-19 DIAGNOSIS — Z87891 Personal history of nicotine dependence: Secondary | ICD-10-CM | POA: Insufficient documentation

## 2016-06-19 DIAGNOSIS — Z7901 Long term (current) use of anticoagulants: Secondary | ICD-10-CM | POA: Diagnosis not present

## 2016-06-19 DIAGNOSIS — N181 Chronic kidney disease, stage 1: Secondary | ICD-10-CM

## 2016-06-19 LAB — BASIC METABOLIC PANEL
ANION GAP: 11 (ref 5–15)
BUN: 33 mg/dL — ABNORMAL HIGH (ref 6–20)
CALCIUM: 8.4 mg/dL — AB (ref 8.9–10.3)
CHLORIDE: 96 mmol/L — AB (ref 101–111)
CO2: 30 mmol/L (ref 22–32)
Creatinine, Ser: 1.41 mg/dL — ABNORMAL HIGH (ref 0.61–1.24)
GFR calc non Af Amer: 47 mL/min — ABNORMAL LOW (ref >60)
GFR, EST AFRICAN AMERICAN: 54 mL/min — AB (ref >60)
Glucose, Bld: 347 mg/dL — ABNORMAL HIGH (ref 65–99)
Potassium: 4.3 mmol/L (ref 3.5–5.1)
Sodium: 137 mmol/L (ref 135–145)

## 2016-06-19 LAB — BRAIN NATRIURETIC PEPTIDE: B Natriuretic Peptide: 454.6 pg/mL — ABNORMAL HIGH (ref 0.0–100.0)

## 2016-06-19 NOTE — Progress Notes (Signed)
Advanced Heart Failure Clinic Note   Referring Physician: Primary Care: Primary Cardiologist:  HPI:  Joshua [name] is a 76 y.o. male with PMH of chronic diastolic CHF, COPD on home 02, COPD, history of asbestos exposure, HTN, HLD, and DM.   Admitted 5/31 -> 06/11/16 with worsening SOB, volume overload, and increased 02 demand. Diuresed on IV lasix and given solumedrol. Thought to be mixed AECOPD and acute CHF. Moved to stepdown 06/05/16 with worsening resp status and BiPAP requirement. Improved with nebs, steroids, ABX and IV lasix + metolazone. Sent home on [place] and prednisone taper. Overall diuresed 22 lbs. Discharge weight 233 lbs.   Pt admitted from 6/11 -> 06/17/16 with syncope (unwitnessed) Noted to have fall x 3. EKG with NSW with chronic LBBB. Creatinine mildly elevated. CT head negative for acute process. R hip xray negative for fracture or dislocated hardware.  Felt to be vasovagal. Received NS and diuretics held up to discharge.  Creatinine improved.  He presents today for post hospital visit. Feeling good overall. In rehab at [facility]. Isn't very satisfied. Doesn't feel like they are working him hard enough. Feels like they're understaffed and unable to spend enough time with him. Feels like he is getting stronger. Remains on 02 at 2L. Weight stable. No orthopnea. Sleeping on 1 pillow. No further lightheadedness or dizziness. No further falls.     Echo (6/3): EF 25-30%, severe hypokinesis, moderate MR, mildly dilated RV with mildly decreased systolic function, PASP 48 mmHg.   R/LHC 06/10/2016 with mildly elevated L/R filling pressures, preserved cardiac output, and severe 3 vessel native disease with patent bypass grafts. No interventional target.  Hemodynamics (mmHg) RA mean 10 RV 49/12 PA 45/26, mean 35 PCWP mean 21 LV 127/24 AO 126/63 Oxygen saturations: PA 59% AO 96% Cardiac Output (Fick) 4.82 Cardiac Index (Fick) 2.11 PVR 2.9 WU Cardiac Output (Thermo)  5.5 Cardiac Index (Thermo) 2.41  Review of systems complete and found to be negative unless listed in HPI.     Past Medical History:  Diagnosis Date  . Acute respiratory distress 06/04/2016  . Adenomatous colon polyp 10/16/2011   Repeat 2018  . Arthritis     Hips, R>L & KNEES  . CAD, multiple vessel    3V CAD cath 08/08/13----CABG done shortly after.  . 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 pylori neg, no dysplasia or malignancy.  . Chronic diastolic heart failure ([place]) 2016  . Chronic renal insufficiency, stage II (mild)   . COPD (chronic obstructive pulmonary disease) ([place])    GOLD II.  Spirometry  2004 borderline obstruction; 2015 mod obst: noncompliant with bid symbicort so pulmonologist switched him to a once daily inhaler: Trelegy ellipta 12/2015.  . Diabetic nephropathy ([place])    Elevated urine microalb/cr 03/2011  . DM type 2 (diabetes mellitus, type 2) ([place])    Poor control on max oral meds--pt eventually agreed to insulin therapy.  As of 2017 his DM is being managed by Dr. Dwyane Dee in endo.  . DOE (dyspnea on exertion)    COPD + chronic diastolic HF  . Erectile dysfunction    Normal testosterone  . Hearing loss of both ears 2016   Hearing aids  . Hyperlipidemia   . Hyperplastic colon polyp 2001  . Hypertension   . Iron deficiency anemia 2014   03/2012 capsule endoscopy showed 2 AVMs--likely responsible for his IDA--lifetime iron supp recommended + q54moCBCs.  . Macular degeneration, dry    Mild, bilat (  Optometrist, Mayford Knife at [facility] in [place], [place])  . Obesity   . Open toe wound 11/2015   Wound care clinic appt made and then canceled when toe improved.  . Other and unspecified angina pectoris   . PAD (peripheral artery disease) ([place]) 02/2016   Abnormal ABI's and waveforms: LE arterial duplex ordered for f/u as per cardiologist's recommendation.  Vasc eval by Dr. Bridgett Larsson: impression was minimal PAD, recommended  maximize med mgmt.  [name] PAF (paroxysmal atrial fibrillation) ([place]) 10/2014   xarelto  . Pericardial effusion with cardiac tamponade 6//29/15   pericardiocentesis was done,  Infectious/inflamm (cytology showed NO MALIGNANT CELLS)  . Pleural plaque    asbestos exposure x 2 yrs: Dr. Vaughan Browner ordered CT chest for further eval on 02/2016  . Pneumonia   . Tobacco dependence in remission    100+ pack-yr hx: quit after CABG    Current Outpatient Prescriptions  Medication Sig Dispense Refill  . acetaminophen (TYLENOL) 325 MG tablet Take 2 tablets (650 mg total) by mouth every 4 (four) hours as needed for headache or mild pain.    . Ascorbic Acid (VITAMIN C) 1000 MG tablet Take 1,000 mg by mouth daily.    [name] aspirin 81 MG tablet Take 81 mg by mouth at bedtime. Reported on 02/26/2015    . atorvastatin (LIPITOR) 40 MG tablet TAKE ONE TABLET BY MOUTH EVERY DAY 30 tablet 6  . bisoprolol (ZEBETA) 5 MG tablet Take 1 tablet (5 mg total) by mouth daily. 30 tablet 6  . budesonide-formoterol (SYMBICORT) 160-4.5 MCG/ACT inhaler Inhale 2 puffs into the lungs 2 (two) times daily. 1 Inhaler 12  . buPROPion (WELLBUTRIN SR) 150 MG 12 hr tablet TAKE ONE TABLET BY MOUTH TWICE DAILY 60 tablet 6  . Cholecalciferol (VITAMIN D-3) 1000 units CAPS Take 1,000 Units by mouth daily. Reported on 02/26/2015    . Chromium Picolinate 1000 MCG TABS Take 1,000 mcg by mouth daily. Reported on 02/26/2015    . citalopram (CELEXA) 20 MG tablet Take 1 tablet (20 mg total) by mouth daily. 30 tablet 2  . digoxin (LANOXIN) 0.125 MG tablet Take 1 tablet (0.125 mg total) by mouth daily. 30 tablet 6  . fluticasone (FLONASE) 50 MCG/ACT nasal spray Place 2 sprays into both nostrils daily. 16 g 1  . gabapentin (NEURONTIN) 100 MG capsule TAKE 2 CAPSULES BY MOUTH 3 TIMES DAILY (Patient taking differently: Take two capsules three times daily) 180 capsule 6  . guaiFENesin (MUCINEX) 600 MG 12 hr tablet Take 2 tablets (1,200 mg total) by mouth 2 (two) times  daily. 30 tablet 0  . Insulin Glargine (TOUJEO SOLOSTAR) 300 UNIT/ML SOPN Inject 70 Units into the skin daily. 6 pen 1  . insulin regular (NOVOLIN R,HUMULIN R) 100 units/mL injection Inject 16 units SQ with BF, 17 units SQ with Lunch, and 18 U SQ with Supper (Patient taking differently: 16 units in the am, 17 at lunch and 18 at bedtime) 1 vial 6  . isosorbide mononitrate (IMDUR) 30 MG 24 hr tablet TAKE ONE TABLET BY MOUTH DAILY 90 tablet 1  . Multiple Vitamin (MULTIVITAMIN WITH MINERALS) TABS Take 1 tablet by mouth daily.    . nitroGLYCERIN (NITROSTAT) 0.4 MG SL tablet Place 1 tablet (0.4 mg total) under the tongue every 5 (five) minutes as needed for chest pain. 25 tablet 3  . potassium chloride SA (K-DUR,KLOR-CON) 20 MEQ tablet Take 1 tablet (20 mEq total) by mouth daily. 30 tablet 6  . PROAIR  HFA 108 (90 Base) MCG/ACT inhaler INHALE 2 PUFFS INTO THE LUNGS EVERY 4 HOURS AS NEEDED FOR WHEEZING ORSHORTNESS OF BREATH 8.5 g 1  . rivaroxaban (XARELTO) 20 MG TABS tablet Take 1 tablet (20 mg total) by mouth daily with supper. 90 tablet 1  . spironolactone (ALDACTONE) 25 MG tablet Take 1 tablet (25 mg total) by mouth daily. 30 tablet 6  . SYNJARDY 05-998 MG TABS TAKE ONE TABLET BY MOUTH TWICE DAILY AFTER A MEAL 60 tablet 3  . tamsulosin (FLOMAX) 0.4 MG CAPS capsule TAKE 1 CAPSULE BY MOUTH EVERY DAY AFTER SUPPER 90 capsule 1  . torsemide (DEMADEX) 20 MG tablet Take 2 tablets (40 mg total) by mouth daily. 60 tablet 6   No current facility-administered medications for this encounter.     Allergies  Allergen Reactions  . Morphine And Related Other (See Comments)    Drenched with perspiration  . Demerol [Meperidine] Nausea Only  . Starlix [Nateglinide] Other (See Comments)    gassy      Social History   Social History  . Marital status: Married    Spouse name: [name]  . Number of children: 4  . Years of education: N/A   Occupational History  . bus operator    Social History Main Topics  .  Smoking status: Former Smoker    Packs/day: 0.25    Years: 62.00    Types: Cigarettes    Quit date: 10/23/2015  . Smokeless tobacco: Former Systems developer    Quit date: 06/30/2013  . Alcohol use No  . Drug use: No  . Sexual activity: No   Other Topics Concern  . Not on file   Social History Narrative   Married, 4 children.   Formerly a Medical illustrator.   Local transportation bus driver.  Level of education: HS.     +tobacco--lifelong (still smoking as of 08/2011).  No alcohol or drugs.   No exercise.  +excessive caffeine.      Family History  Problem Relation Age of Onset  . Heart disease Father   . Heart attack Father   . CVA Mother   . Hypertension Mother   . Diabetes Paternal Grandmother   . Breast cancer Sister   . Diabetes Maternal Uncle   . Heart disease Maternal Uncle   . Stroke Neg Hx     Vitals:   06/19/16 1040  BP: (!) 124/56  Pulse: (!) 58  SpO2: 99%  Weight: 235 lb 6.4 oz (106.8 kg)   Wt Readings from Last 3 Encounters:  06/19/16 235 lb 6.4 oz (106.8 kg)  06/17/16 225 lb (102.1 kg)  06/11/16 233 lb 6.4 oz (105.9 kg)     PHYSICAL EXAM: General:  Elderly appearing. No respiratory difficulty HEENT: Normal Neck: Supple. JVP 7-8 cm. JVD. Carotids 2+ bilat; no bruits. No lymphadenopathy or thyromegaly appreciated. Cor: PMI nondisplaced. Regular rate & rhythm. No rubs, gallops or murmurs. Lungs: CTAB Abdomen: soft, nontender, nondistended. No hepatosplenomegaly. No bruits or masses. Good bowel sounds. Extremities: no cyanosis, clubbing, rash, edema Neuro: alert & oriented x 3, cranial nerves grossly intact. moves all 4 extremities w/o difficulty. Affect pleasant.  ASSESSMENT & PLAN:  1. 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 NYHA III symptoms, confounded by his respiratory failure - Continue  torsemide 40 mg daily for now. Low threshold to  decrease. Have written instructions to facility to call with any further lightheadedness or weight loss.  - Continue bisoprolol 5 mg daily - Continue spironolactone 25 mg BID.  - With recent falls and weakness thought to be vasovagal, will not increase meds at this point.  - Reinforced fluid restriction to < 2 L daily, sodium restriction to less than 2000 mg daily, and the importance of daily weights.   2. Chronic hypoxemic respiratory failure - Stable on home 02.  3. CAD s/p CABG - Cath 06/10/16 with severe 3 vessel native disease with patent bypass grafts. No interventional target.  - Continue ASA and statin 4. Paroxysmal A fib - Regular by exam. EKG in ED with NSR.  - Continue Xarelto. No bleeding.  5. Syncope - Recent syncope felt to be vasovagal. Given gentle IVF and improved with diuretics held.  - If recurs, would likely need to consider for ICD with low EF.  6. Recent AKI - BMET today.   Follow up 3 weeks for med titration. Consider losartan at that time if stable. Follow up with MD 6 weeks.   Shirley Friar, PA-C 06/19/16

## 2016-06-19 NOTE — Telephone Encounter (Signed)
  This encounter was created in error - please disregard.

## 2016-06-19 NOTE — Telephone Encounter (Signed)
CHF Clinic appointment reminder call placed to patient for upcoming post-hospital follow up.  Does understand purpose of this appointment and where CHF Clinic is located? Spoke with wife to confirm apt date and time and location.  Wife is aware of entrance and parking.  How is patient feeling? States feeling better day by day, no complaints.  Does patient have all of their medications since their recent discharge? Yes  Patient also reminded to take all medications as prescribed on the day of his/her appointment and to bring all medications to this appointment.  Advised to call our office for tardiness or cancellations/rescheduling needs.  Leory Plowman, Guinevere Ferrari

## 2016-06-19 NOTE — Patient Instructions (Signed)
Routine lab work today. Will notify you of abnormal results, otherwise no news is good news!  No changes to medication at this time.  Follow up with Ileene Patrick CHF clinical pharmacist in 3 weeks.  Follow up with Dr. [name] in 6-8 weeks. Take all medication as prescribed the day of your appointment. Bring all medications with you to your appointment.  Do the following things EVERYDAY: 1) Weigh yourself in the morning before breakfast. Write it down and keep it in a log. 2) Take your medicines as prescribed 3) Eat low salt foods-Limit salt (sodium) to 2000 mg per day.  4) Stay as active as you can everyday 5) Limit all fluids for the day to less than 2 liters

## 2016-06-19 NOTE — Telephone Encounter (Signed)
Member ID: [id number] Group ID: [id number] RxBin ID: [id number] PCN: [id number] Eligibility Start Date: 03/21/2016 Eligibility End Date: 06/18/2017 Assistance Amount: $800.00   Doroteo Bradford K. Velva Harman, PharmD, BCPS, CPP Clinical Pharmacist Pager: [phone] Phone: [phone] 06/19/2016 2:44 PM

## 2016-06-19 NOTE — Progress Notes (Signed)
Advanced Heart Failure Medication Review by a Pharmacist  Does the patient  feel that his/her medications are working for him/her?  yes  Has the patient been experiencing any side effects to the medications prescribed?  no  Does the patient measure his/her own blood pressure or blood glucose at home?  yes   Does the patient have any problems obtaining medications due to transportation or finances?   No but patient is in the donut hole, they are late paying other bills to make up for meds   Understanding of regimen: good Understanding of indications: good Potential of compliance: good Patient understands to avoid NSAIDs. Patient understands to avoid decongestants.  Issues to address at subsequent visits: affordability    Pharmacist comments: Joshua [name] is a pleasant 76 yo male presenting with wife and medication list from ALF. Several updates were made to med list including insulin dose and gabapentin dose. Per pt's wife, they are in the donut hole and are sacrificing financially to afford his medication. PAP form filled out for Xarelto and will attempt to enroll in [facility]. No side effects or other issues at this time.   [name], Pharm.D. PGY1 Pharmacy Resident 6/15/201811:13 AM Pager [phone]     Time with patient: 10 mins Preparation and documentation time: 5 mins Total time: 15 mins

## 2016-06-22 ENCOUNTER — Encounter: Payer: Self-pay | Admitting: *Deleted

## 2016-06-22 ENCOUNTER — Other Ambulatory Visit: Payer: Self-pay | Admitting: *Deleted

## 2016-06-22 ENCOUNTER — Other Ambulatory Visit: Payer: Self-pay

## 2016-06-22 NOTE — Patient Outreach (Signed)
[facility]) Care Management  06/22/2016  Joshua [name] [birth year] [id number]   Subjective: none  Objective: none  Assessment: Client hospitalized 5/31-6/7 with respiratory failure, heart failure. Presented to the hospital 6/11 due to weakness, falls transferred to skilled facility [facility]) for rehabilitation. History of chronic diastolic CHF, COPD on home 02, COPD, history of asbestos exposure, HTN, HLD, and DM.  Plan: RNCM will close community Transition of care program. RNCM to follow post rehabilitation for transition of care upon transition to home.  Thea Silversmith, RN, MSN, [place] Coordinator Cell: [phone]

## 2016-06-22 NOTE — Patient Outreach (Signed)
[facility]) Care Management  [place]  06/22/2016   Joshua [name] 23-Apr-1940 [id number]   Encounter Medications:  Outpatient Encounter Prescriptions as of 06/22/2016  Medication Sig  . acetaminophen (TYLENOL) 325 MG tablet Take 2 tablets (650 mg total) by mouth every 4 (four) hours as needed for headache or mild pain.  . Ascorbic Acid (VITAMIN C) 1000 MG tablet Take 1,000 mg by mouth daily.  [name] aspirin 81 MG tablet Take 81 mg by mouth at bedtime. Reported on 02/26/2015  . atorvastatin (LIPITOR) 40 MG tablet TAKE ONE TABLET BY MOUTH EVERY DAY  . bisoprolol (ZEBETA) 5 MG tablet Take 1 tablet (5 mg total) by mouth daily.  . budesonide-formoterol (SYMBICORT) 160-4.5 MCG/ACT inhaler Inhale 2 puffs into the lungs 2 (two) times daily.  [name] buPROPion (WELLBUTRIN SR) 150 MG 12 hr tablet TAKE ONE TABLET BY MOUTH TWICE DAILY  . Cholecalciferol (VITAMIN D-3) 1000 units CAPS Take 1,000 Units by mouth daily. Reported on 02/26/2015  . Chromium Picolinate 1000 MCG TABS Take 1,000 mcg by mouth daily. Reported on 02/26/2015  . citalopram (CELEXA) 20 MG tablet Take 1 tablet (20 mg total) by mouth daily.  . digoxin (LANOXIN) 0.125 MG tablet Take 1 tablet (0.125 mg total) by mouth daily.  . fluticasone (FLONASE) 50 MCG/ACT nasal spray Place 2 sprays into both nostrils daily.  [name] gabapentin (NEURONTIN) 100 MG capsule TAKE 2 CAPSULES BY MOUTH 3 TIMES DAILY (Patient taking differently: Take two capsules three times daily)  . guaiFENesin (MUCINEX) 600 MG 12 hr tablet Take 2 tablets (1,200 mg total) by mouth 2 (two) times daily.  . Insulin Glargine (TOUJEO SOLOSTAR) 300 UNIT/ML SOPN Inject 70 Units into the skin daily.  . insulin regular (NOVOLIN R,HUMULIN R) 100 units/mL injection Inject 16 units SQ with BF, 17 units SQ with Lunch, and 18 U SQ with Supper (Patient taking differently: 16 units in the am, 17 at lunch and 18 at bedtime)  . isosorbide mononitrate (IMDUR) 30 MG 24 hr tablet TAKE ONE  TABLET BY MOUTH DAILY  . Multiple Vitamin (MULTIVITAMIN WITH MINERALS) TABS Take 1 tablet by mouth daily.  . nitroGLYCERIN (NITROSTAT) 0.4 MG SL tablet Place 1 tablet (0.4 mg total) under the tongue every 5 (five) minutes as needed for chest pain.  . potassium chloride SA (K-DUR,KLOR-CON) 20 MEQ tablet Take 1 tablet (20 mEq total) by mouth daily.  [name] PROAIR HFA 108 (90 Base) MCG/ACT inhaler INHALE 2 PUFFS INTO THE LUNGS EVERY 4 HOURS AS NEEDED FOR WHEEZING ORSHORTNESS OF BREATH  . rivaroxaban (XARELTO) 20 MG TABS tablet Take 1 tablet (20 mg total) by mouth daily with supper.  [name] spironolactone (ALDACTONE) 25 MG tablet Take 1 tablet (25 mg total) by mouth daily.  [name] SYNJARDY 05-998 MG TABS TAKE ONE TABLET BY MOUTH TWICE DAILY AFTER A MEAL  . tamsulosin (FLOMAX) 0.4 MG CAPS capsule TAKE 1 CAPSULE BY MOUTH EVERY DAY AFTER SUPPER  . torsemide (DEMADEX) 20 MG tablet Take 2 tablets (40 mg total) by mouth daily.   No facility-administered encounter medications on file as of 06/22/2016.     Functional Status:  In your present state of health, do you have any difficulty performing the following activities: 06/22/2016 06/16/2016  Hearing? Tempie Donning  Vision? Y N  Difficulty concentrating or making decisions? N N  Walking or climbing stairs? N N  Dressing or bathing? N N  Doing errands, shopping? N Y  Conservation officer, nature and eating ? - -  Using the Toilet? - -  In the past six months, have you accidently leaked urine? - -  Do you have problems with loss of bowel control? - -  Managing your Medications? - -  Managing your Finances? - -  Housekeeping or managing your Housekeeping? - -  Some recent data might be hidden    Fall/Depression Screening: Fall Risk  06/22/2016 05/18/2016 01/09/2016  Falls in the past year? Yes Yes Yes  Number falls in past yr: 2 or more 2 or more -  Injury with Fall? Yes No Yes  Risk Factor Category  - - High Fall Risk  Risk for fall due to : History of fall(s);Impaired balance/gait -  Impaired balance/gait  Risk for fall due to (comments): - - uses cane  Follow up Education provided;Falls prevention discussed - -   PHQ 2/9 Scores 06/04/2016 05/18/2016 01/09/2016 10/29/2015 09/26/2015 08/01/2015 06/06/2015  PHQ - 2 Score _0 0 _1 PHQ- 9 Score 24 10 - - _2 Exception Documentation - - - - - - -    Assessment:   CSW received referral from [facility] [place] for "post hospital follow up at skilled facility, patient was active with community nurse prior to admission, social worker please re-assign when discharge from skilled facility occurs" CSW met with patient, wife - [name] & nephew in patient's room at [facility]. Patient states that he was at [facility] 6/11-6/13/18 when he admitted to [facility] from [facility] last Wednesday, 06/17/16 and states that he has worked with both PT and OT daily and feels that he has made great progress so far, hoping to be able to return home possibly by the end of this week. CSW spoke with Education officer, museum at [employer], Delton See to make her aware of patient & wife's plans.   CSW will follow-up next week to see if patient has discharged from [facility], but asked patient's wife to call CSW if patient discharges home before then.   [facility] CM Care Plan Problem One     Most Recent Value  Care Plan Problem One  Recent hospitalization/admission to SNF due to weakness  Role Documenting the Problem One  Clinical Social Worker  Care Plan for Problem One  Active  [facility] CM Short Term Goal #1   Patient will participate with PT/OT at SNF daily within the next 20 days  [facility] CM Short Term Goal #1 Start Date  06/22/16  Interventions for Short Term Goal #1  Patient motivated to work with PT/OT as plan is to return home when stable       Raynaldo Opitz, [place] Social Worker cell #: [phone]

## 2016-06-24 ENCOUNTER — Ambulatory Visit (INDEPENDENT_AMBULATORY_CARE_PROVIDER_SITE_OTHER): Payer: Medicare Other | Admitting: Acute Care

## 2016-06-24 ENCOUNTER — Encounter: Payer: Self-pay | Admitting: Acute Care

## 2016-06-24 DIAGNOSIS — J441 Chronic obstructive pulmonary disease with (acute) exacerbation: Secondary | ICD-10-CM

## 2016-06-24 NOTE — Progress Notes (Signed)
History of Present Illness Joshua [name] is a 76 y.o. male with PMH of chronic diastolic CHF, COPD on home 02, COPD, history of asbestos exposure, HTN, HLD, and DM. He is followed by Dr. Vaughan Browner   06/24/2016 Hospital Follow Up: Pt. Presents today for hospital follow up. He was admitted 5/31-06/11/2016 for progressive dyspnea , Acute on Chronic Hypoxemic respiratory failure and acute CHF . He was treated with BiPAP, ICU monitoring, scheduled nebulizer treatments, IV steroids, ABX, IV lasix and metolazone. CVP was followed and heart failure medications were adjusted. He was discharged on azithromycin and a prednisone taper. He was wearing oxygen as needed to maintain oxygen saturations between 88-92%he has completed the azithromycin and prednisone taper.    He states he is doing better.He is currently in rehabilitation at [facility]. Plans are to discharge home on this Friday, 06/26/2016. He states he is rarely coughing . On the rare occasion that he coughs the secretions are white.He denies any fever or chest pain. He is compliant with his Symbicort.He has not had to use his rescue inhaler since discharge.He is compliant with his Demadex. He is using his oxygen as needed, with  oxygen saturations goals between 88-92%. He is participating in structured rehab.He does have some 1+ edema to lower extremities that his wife states goes away with elevation. Of note he had an additional admission 6/11-6/13/18 for falls. He had dehydration with elevated creatinine. His diuretics have been adjusted and labs are being followed by Janyce Llanos PCP.  Test Results:  Echo (6/3): EF 25-30%, severe hypokinesis, moderate MR, mildly dilated RV with mildly decreased systolic function, PASP 48 mmHg.   [facility] 06/10/16 showed mildly elevated left/right filling pressures, preserved cardiac output, and severe 3 vessel native disease with patent bypass grafts. No interventional target.    CBC Latest Ref Rng & Units  06/16/2016 06/16/2016 06/15/2016  WBC 4.0 - 10.5 K/uL - 11.5(H) 14.0(H)  Hemoglobin 13.0 - 17.0 g/dL 13.7 13.2 14.2  Hematocrit 39.0 - 52.0 % 45.6 45.0 46.0  Platelets 150 - 400 K/uL - 183 225    BMP Latest Ref Rng & Units 06/19/2016 06/17/2016 06/16/2016  Glucose 65 - 99 mg/dL 347(H) 200(H) 197(H)  BUN 6 - 20 mg/dL 33(H) 43(H) 59(H)  Creatinine 0.61 - 1.24 mg/dL 1.41(H) 1.18 1.54(H)  Sodium 135 - 145 mmol/L 137 137 139  Potassium 3.5 - 5.1 mmol/L 4.3 3.2(L) 4.5  Chloride 101 - 111 mmol/L 96(L) 95(L) 91(L)  CO2 22 - 32 mmol/L 30 32 38(H)  Calcium 8.9 - 10.3 mg/dL 8.4(L) 8.4(L) 8.9    BNP    Component Value Date/Time   BNP 454.6 (H) 06/19/2016 1138    ProBNP    Component Value Date/Time   PROBNP 1,443.0 (H) 06/30/2013 1825    PFT    Component Value Date/Time   FEV1PRE 2.12 08/09/2013 0803   FEV1POST 2.32 08/09/2013 0803   FVCPRE 3.29 08/09/2013 0803   FVCPOST 3.47 08/09/2013 0803   TLC 5.73 08/09/2013 0803   DLCOUNC 23.98 08/09/2013 0803   PREFEV1FVCRT 65 08/09/2013 0803   PSTFEV1FVCRT 67 08/09/2013 0803    Dg Chest 2 View  Result Date: 06/16/2016 CLINICAL DATA:  Shortness of breath. EXAM: CHEST  2 VIEW COMPARISON:  Radiograph of June 08, 2016. FINDINGS: Stable cardiomegaly with mild central pulmonary vascular congestion is noted. Status post coronary artery bypass graft. No pneumothorax or pleural effusion is noted. Multilevel degenerative disc disease is noted in the thoracic spine. Decreased bibasilar and perihilar  opacities are noted suggesting improving edema. Mild bibasilar subsegmental atelectasis is noted. IMPRESSION: Mild bibasilar subsegmental atelectasis. Improved pulmonary edema compared to prior exam. Aortic atherosclerosis. Electronically Signed   By: Marijo Conception, M.D.   On: 06/16/2016 13:09   Ct Head Wo Contrast  Result Date: 06/15/2016 CLINICAL DATA:  Status post falls, with right leg weakness. Initial encounter. EXAM: CT HEAD WITHOUT CONTRAST TECHNIQUE:  Contiguous axial images were obtained from the base of the skull through the vertex without intravenous contrast. COMPARISON:  CT of the head performed 04/04/2015 FINDINGS: Brain: No evidence of acute infarction, hemorrhage, hydrocephalus, extra-axial collection or mass lesion/mass effect. Prominence of the ventricles and sulci reflects mild cortical volume loss. 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 Mild cerebellar atrophy is noted. The brainstem and fourth ventricle are within normal limits. The cerebral hemispheres demonstrate grossly normal gray-white differentiation. No mass effect or midline shift is seen. Vascular: No hyperdense vessel or unexpected calcification. Skull: There is no evidence of fracture; visualized osseous structures are unremarkable in appearance. Sinuses/Orbits: The orbits are within normal limits. The paranasal sinuses and mastoid air cells are well-aerated. Other: No significant soft tissue abnormalities are seen. IMPRESSION: 1. No evidence of traumatic intracranial injury or fracture. 2. Mild cortical volume loss. 3. Chronic lacunar infarcts at the right basal ganglia. Electronically Signed   By: Garald Balding M.D.   On: 06/15/2016 21:23   Ct Chest High Resolution  Result Date: 06/02/2016 CLINICAL DATA:  History of asbestos exposure. Follow-up pulmonary nodule. EXAM: CT CHEST WITHOUT CONTRAST TECHNIQUE: Multidetector CT imaging of the chest was performed following the standard protocol without intravenous contrast. High resolution imaging of the lungs, as well as inspiratory and expiratory imaging, was performed. COMPARISON:  07/02/2013 chest CT.  05/18/2016 chest radiograph. FINDINGS: Cardiovascular: Top-normal heart size. No significant pericardial fluid/thickening. Left main, left anterior descending, left circumflex and right coronary atherosclerosis status post CABG. Atherosclerotic nonaneurysmal thoracic aorta. Stable dilated main pulmonary artery (3.7  cm diameter). Mediastinum/Nodes: Dominant hypodense 1.4 cm left thyroid lobe nodule. Unremarkable esophagus. No axillary adenopathy. Stable mildly enlarged 1.0 cm subcarinal node (series 2/ image 64). No additional pathologically enlarged mediastinal or gross hilar nodes on this noncontrast scan. Lungs/Pleura: No pneumothorax. Numerous stable calcified bilateral pleural plaques. Small right and trace left dependent pleural effusions. The previously described apical right upper lobe ground-glass pulmonary nodule on 07/02/2013 chest CT is absent on today's scan. There is new mild patchy peribronchovascular consolidation in the right upper lobe. There is mild patchy ground-glass attenuation in both lungs. There is patchy subpleural reticulation throughout both lungs, increased in the interval. There are several scattered parenchymal bands throughout both lungs, most prominent in the lower lobes. No significant air trapping on the expiration sequence. No significant regions of traction bronchiectasis or frank honeycombing. Upper abdomen: Cholecystectomy. Musculoskeletal: No aggressive appearing focal osseous lesions. Marked thoracic spondylosis. Sternotomy wires appear intact. IMPRESSION: 1. Previously described apical right upper lobe ground-glass pulmonary nodule on 07/02/2013 chest CT is absent on today's scan, compatible with a resolved inflammatory nodule . 2. New mild patchy peribronchovascular consolidation in the right upper lobe. Mild patchy ground-glass attenuation in both lungs. These findings are favored to represent a mild bronchopneumonia. 3. Bilateral calcified pleural plaques. Small right and trace left dependent pleural effusions. 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 4. Mild patchy subpleural reticulation and parenchymal banding throughout both lungs, mildly increased in the interval, compatible with asbestosis. No significant traction  bronchiectasis or frank  honeycombing. 5. Aortic atherosclerosis. Left main and 3 vessel coronary atherosclerosis status post CABG. 6. Stable dilated main pulmonary artery, suggesting chronic pulmonary arterial hypertension. 7. Stable mild mediastinal lymphadenopathy, compatible with benign reactive adenopathy. These results will be called to the ordering clinician or representative by the Radiologist Assistant, and communication documented in the PACS or zVision Dashboard. Electronically Signed   By: Ilona Sorrel M.D.   On: 06/02/2016 09:15   Dg Chest Port 1 View  Result Date: 06/08/2016 CLINICAL DATA:  Congestive heart failure EXAM: PORTABLE CHEST 1 VIEW COMPARISON:  June 06, 2016 FINDINGS: Central catheter tip is at the cavoatrial junction. No pneumothorax. There are small pleural effusions bilaterally. There is interstitial edema. There is cardiomegaly with pulmonary venous hypertension. There is mild bibasilar atelectasis. There is patchy airspace consolidation in the left base. No evident bone lesions. No adenopathy. IMPRESSION: Evidence of congestive heart failure. Mild bibasilar atelectasis. Question alveolar edema versus patchy pneumonia left base. No pneumothorax. There appears to be slightly less edema compared to most recent study. Electronically Signed   By: Lowella Grip III M.D.   On: 06/08/2016 07:12   Dg Chest Port 1 View  Result Date: 06/06/2016 CLINICAL DATA:  Confirm PICC line placement. EXAM: PORTABLE CHEST 1 VIEW COMPARISON:  Chest x-ray dated 06/05/2016. FINDINGS: Right-sided PICC line has been placed with tip appropriately positioned at the level of the mid/lower SVC. Cardiomegaly is stable. Median sternotomy wires in place. Atherosclerotic changes again noted at the aortic arch. Again noted is central pulmonary vascular congestion and bilateral interstitial edema. Suspect small bilateral pleural effusions with possible adjacent atelectasis. No pneumothorax seen. IMPRESSION: 1. Right-sided PICC line  placement with tip appropriately positioned at the level of the mid/lower SVC. 2. Persistent pulmonary vascular congestion and bilateral interstitial edema, suggesting CHF/volume overload. 3. Probable small bilateral pleural effusions and/or atelectasis. 4. Stable cardiomegaly. Electronically Signed   By: Franki Cabot M.D.   On: 06/06/2016 10:54   Dg Chest Port 1 View  Result Date: 06/05/2016 CLINICAL DATA:  Hypoxia. EXAM: PORTABLE CHEST 1 VIEW COMPARISON:  Single-view of the chest 06/04/2016. PA and lateral chest 05/18/2016. CT chest 06/02/2016. FINDINGS: Cardiomegaly and mild interstitial edema do not appear notably changed since the most recent study. The patient is status post CABG. No pneumothorax or pleural effusion. IMPRESSION: No change in cardiomegaly and mild interstitial edema. Electronically Signed   By: Inge Rise M.D.   On: 06/05/2016 17:19   Dg Chest Portable 1 View  Result Date: 06/04/2016 CLINICAL DATA:  Episode of shortness of breath 2-3 weeks ago without chest pain. One year history of intermittently productive cough. Discontinued smoking 8 months ago. History of diabetes and COPD. 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 EXAM: PORTABLE CHEST 1 VIEW COMPARISON:  CT scan of the chest of Jun 02, 2016 and chest x-ray of May 18, 2016. FINDINGS: The lungs are adequately inflated. The interstitial markings remain increased air and are slightly more conspicuous overall today. The cardiac silhouette is enlarged today in the pulmonary vascularity is more engorged. The patient has undergone previous CABG. There is calcification in the wall of the aortic arch. There are calcified pleural plaques bilaterally. The observed bony thorax exhibits no acute abnormality. IMPRESSION: Mild interval worsening in the appearance of the pulmonary interstitium which likely reflects CHF superimposed on known chronic lung disease. There is no alveolar pneumonia. Electronically Signed   By: David  Martinique  M.D.   On: 06/04/2016 12:51  Dg Hip Unilat W Or Wo Pelvis 1 View Right  Result Date: 06/15/2016 CLINICAL DATA:  Right hip pain after fall on Saturday. Hip replacement in 2015. EXAM: DG HIP (WITH OR WITHOUT PELVIS) 1V RIGHT COMPARISON:  None. FINDINGS: There is no evidence of acute hip fracture or dislocation. The bony pelvis appears intact lower lumbar degenerative disc and facet arthropathy. No pelvic diastasis. The native left hip joint is maintained. Intact uncemented right hip arthroplasty without asymmetric lining wear. Mild heterotopic bone formation is seen about the right hip consistent with postop change. There is no evidence of arthropathy or other focal bone abnormality. IMPRESSION: Intact right uncemented hip arthroplasty with postop change. No acute displaced fracture is seen. Electronically Signed   By: Ashley Royalty M.D.   On: 06/15/2016 21:47   Dg Femur 1 View Right  Result Date: 06/15/2016 CLINICAL DATA:  Patient fell with right-sided lower extremity pain. EXAM: RIGHT FEMUR 1 VIEW COMPARISON:  None. FINDINGS: Overlapping AP views were provided as requested. This limits assessment for displacement and dislocations in the antero posterior plane. There is an intact uncemented right hip arthroplasty with postop heterotopic new bone formation about the right hip. Common femoral through popliteal calcific atherosclerosis. Vascular clips are noted bilaterally along the medial aspect of the distal thighs. Femorotibial joint space narrowing with chondrocalcinosis is seen about the knee. The visualized pubic rami appear intact. IMPRESSION: Negative for acute fracture or gross malalignment. Electronically Signed   By: Ashley Royalty M.D.   On: 06/15/2016 21:50     Past medical hx Past Medical History:  Diagnosis Date  . Acute respiratory distress 06/04/2016  . Adenomatous colon polyp 10/16/2011   Repeat 2018  . Arthritis     Hips, R>L & KNEES  . CAD, multiple vessel    3V CAD cath  08/08/13----CABG done shortly after.  . 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 pylori neg, no dysplasia or malignancy.  . Chronic diastolic heart failure ([place]) 2016  . Chronic renal insufficiency, stage II (mild)   . COPD (chronic obstructive pulmonary disease) ([place])    GOLD II.  Spirometry  2004 borderline obstruction; 2015 mod obst: noncompliant with bid symbicort so pulmonologist switched him to a once daily inhaler: Trelegy ellipta 12/2015.  . Diabetic nephropathy ([place])    Elevated urine microalb/cr 03/2011  . DM type 2 (diabetes mellitus, type 2) ([place])    Poor control on max oral meds--pt eventually agreed to insulin therapy.  As of 2017 his DM is being managed by Dr. Dwyane Dee in endo.  . DOE (dyspnea on exertion)    COPD + chronic diastolic HF  . Erectile dysfunction    Normal testosterone  . Hearing loss of both ears 2016   Hearing aids  . Hyperlipidemia   . Hyperplastic colon polyp 2001  . Hypertension   . Iron deficiency anemia 2014   03/2012 capsule endoscopy showed 2 AVMs--likely responsible for his IDA--lifetime iron supp recommended + q28moCBCs.  . Macular degeneration, dry    Mild, bilat (Optometrist, [place] Knifeat [place]  . Obesity   . Open toe wound 11/2015   Wound care clinic appt made and then canceled when toe improved.  . Other and unspecified angina pectoris   . P[place] ([place] artery disease) [place] 02[place]   Abnormal ABI's and waveforms: LE arterial duplex ordered for f/u as per cardiologist's recommendation.  Vasc eval by Dr. CBridgett Larsson impression was minimal PAD,  recommended maximize med mgmt.  [name] PAF (paroxysmal atrial fibrillation) ([place]) 10/2014   xarelto  . Pericardial effusion with cardiac tamponade 6//29/15   pericardiocentesis was done,  Infectious/inflamm (cytology showed NO MALIGNANT CELLS)  . Pleural plaque    asbestos exposure x 2 yrs: Dr. Vaughan Browner ordered CT chest for further eval on 02/2016   . Pneumonia   . Tobacco dependence in remission    100+ pack-yr hx: quit after CABG     Social History  Substance Use Topics  . Smoking status: Former Smoker    Packs/day: 0.25    Years: 62.00    Types: Cigarettes    Quit date: 10/23/2015  . Smokeless tobacco: Former Systems developer    Quit date: 06/30/2013  . Alcohol use No    Tobacco Cessation: Former smoker quit 2017  Past surgical hx, Family hx, Social hx all reviewed.  Current Outpatient Prescriptions on File Prior to Visit  Medication Sig  . acetaminophen (TYLENOL) 325 MG tablet Take 2 tablets (650 mg total) by mouth every 4 (four) hours as needed for headache or mild pain.  . Ascorbic Acid (VITAMIN C) 1000 MG tablet Take 1,000 mg by mouth daily.  [name] aspirin 81 MG tablet Take 81 mg by mouth at bedtime. Reported on 02/26/2015  . atorvastatin (LIPITOR) 40 MG tablet TAKE ONE TABLET BY MOUTH EVERY DAY  . bisoprolol (ZEBETA) 5 MG tablet Take 1 tablet (5 mg total) by mouth daily.  . budesonide-formoterol (SYMBICORT) 160-4.5 MCG/ACT inhaler Inhale 2 puffs into the lungs 2 (two) times daily.  [name] buPROPion (WELLBUTRIN SR) 150 MG 12 hr tablet TAKE ONE TABLET BY MOUTH TWICE DAILY  . Cholecalciferol (VITAMIN D-3) 1000 units CAPS Take 1,000 Units by mouth daily. Reported on 02/26/2015  . Chromium Picolinate 1000 MCG TABS Take 1,000 mcg by mouth daily. Reported on 02/26/2015  . citalopram (CELEXA) 20 MG tablet Take 1 tablet (20 mg total) by mouth daily.  . digoxin (LANOXIN) 0.125 MG tablet Take 1 tablet (0.125 mg total) by mouth daily.  . fluticasone (FLONASE) 50 MCG/ACT nasal spray Place 2 sprays into both nostrils daily.  [name] gabapentin (NEURONTIN) 100 MG capsule TAKE 2 CAPSULES BY MOUTH 3 TIMES DAILY (Patient taking differently: Take two capsules three times daily)  . guaiFENesin (MUCINEX) 600 MG 12 hr tablet Take 2 tablets (1,200 mg total) by mouth 2 (two) times daily.  . Insulin Glargine (TOUJEO SOLOSTAR) 300 UNIT/ML SOPN Inject 70 Units into the  skin daily.  . insulin regular (NOVOLIN R,HUMULIN R) 100 units/mL injection Inject 16 units SQ with BF, 17 units SQ with Lunch, and 18 U SQ with Supper (Patient taking differently: 16 units in the am, 17 at lunch and 18 at bedtime)  . isosorbide mononitrate (IMDUR) 30 MG 24 hr tablet TAKE ONE TABLET BY MOUTH DAILY  . Multiple Vitamin (MULTIVITAMIN WITH MINERALS) TABS Take 1 tablet by mouth daily.  . nitroGLYCERIN (NITROSTAT) 0.4 MG SL tablet Place 1 tablet (0.4 mg total) under the tongue every 5 (five) minutes as needed for chest pain.  . potassium chloride SA (K-DUR,KLOR-CON) 20 MEQ tablet Take 1 tablet (20 mEq total) by mouth daily.  [name] PROAIR HFA 108 (90 Base) MCG/ACT inhaler INHALE 2 PUFFS INTO THE LUNGS EVERY 4 HOURS AS NEEDED FOR WHEEZING ORSHORTNESS OF BREATH  . rivaroxaban (XARELTO) 20 MG TABS tablet Take 1 tablet (20 mg total) by mouth daily with supper.  [name] spironolactone (ALDACTONE) 25 MG tablet Take 1 tablet (25 mg total) by mouth  daily.  [name] SYNJARDY 05-998 MG TABS TAKE ONE TABLET BY MOUTH TWICE DAILY AFTER A MEAL  . tamsulosin (FLOMAX) 0.4 MG CAPS capsule TAKE 1 CAPSULE BY MOUTH EVERY DAY AFTER SUPPER  . torsemide (DEMADEX) 20 MG tablet Take 2 tablets (40 mg total) by mouth daily.   No current facility-administered medications on file prior to visit.      Allergies  Allergen Reactions  . Morphine And Related Other (See Comments)    Drenched with perspiration  . Demerol [Meperidine] Nausea Only  . Starlix [Nateglinide] Other (See Comments)    gassy    Review Of Systems:  Constitutional:   No  weight loss, night sweats,  Fevers, chills, fatigue, or  lassitude.  HEENT:   No headaches,  Difficulty swallowing,  Tooth/dental problems, or  Sore throat,                No sneezing, itching, ear ache, nasal congestion, post nasal drip,   CV:  No chest pain,  Orthopnea, PND,  +swelling in lower extremities, anasarca, dizziness, palpitations, syncope.   GI  No heartburn, indigestion,  abdominal pain, nausea, vomiting, diarrhea, change in bowel habits, loss of appetite, bloody stools.   Resp: + shortness of breath with exertion less at rest.  No excess mucus, no productive cough,  No non-productive cough,  No coughing up of blood.  No change in color of mucus.  No wheezing.  No chest wall deformity  Skin: no rash or lesions.  GU: no dysuria, change in color of urine, no urgency or frequency.  No flank pain, no hematuria   MS:  No joint pain or swelling.  No decreased range of motion.  No back pain.  Psych:  No change in mood or affect. No depression or anxiety.  No memory loss.   Vital Signs BP (!) 130/54 (BP Location: Left Arm, Cuff Size: Normal)   Pulse 61   Ht 5' 11" (1.803 m)   Wt 240 lb 6.4 oz (109 kg)   SpO2 90%   BMI 33.53 kg/m   Body mass index is 33.53 kg/m.   Physical Exam:  General- No distress,  A&Ox3, obese male using a walker, pleasant ENT: No sinus tenderness, TM clear, pale nasal mucosa, no oral exudate,no post nasal drip, no LAN Cardiac: S1, S2, regular rate and rhythm, no murmur Chest: No wheeze/ rales/ dullness; no accessory muscle use, no nasal flaring, no sternal retractions, diminished per bases Abd.: Soft Non-tender, non-distended, Obese, BS+ Ext: No clubbing cyanosis, 1+ LE edema Neuro:  Deconditioned at baseline Skin: No rashes, warm and dry Psych: normal mood and behavior, appropriate   Assessment/Plan  COPD with exacerbation Resolving COPD exacerbation Plan Please continue your Symbicort 2 puffs twice daily. Rinse mouth after use. Continue using your rescue inhaler for breakthrough shortness of breath. Continue Mucinex as you have been doing. Oxygen for saturation goals of 88-92%. Follow up with Dr. Vaughan Browner in 6 months. Please contact office for sooner follow up if symptoms do not improve or worsen or seek emergency care    Chronic respiratory failure Plan Continue wearing oxygen to maintain oxygen saturations of  88-92% with exertion  Magdalen Spatz, NP 06/24/2016  5:06 PM

## 2016-06-24 NOTE — Assessment & Plan Note (Signed)
Resolving COPD exacerbation Plan Please continue your Symbicort 2 puffs twice daily. Rinse mouth after use. Continue using your rescue inhaler for breakthrough shortness of breath. Continue Mucinex as you have been doing. Oxygen for saturation goals of 88-92%. Follow up with Dr. Vaughan Browner in 6 months. Please contact office for sooner follow up if symptoms do not improve or worsen or seek emergency care

## 2016-06-24 NOTE — Patient Instructions (Addendum)
It is good to see you today. I am glad you are feeling better. Please continue your Symbicort 2 puffs twice daily. Rinse mouth after use. Continue using your rescue inhaler for breakthrough shortness of breath. Continue Mucinex as you have been doing. Oxygen for saturation goals of 88-92%. Follow up with Dr. Vaughan Browner in 6 months. Please contact office for sooner follow up if symptoms do not improve or worsen or seek emergency care

## 2016-06-25 DIAGNOSIS — J449 Chronic obstructive pulmonary disease, unspecified: Secondary | ICD-10-CM | POA: Diagnosis not present

## 2016-06-28 DIAGNOSIS — M1991 Primary osteoarthritis, unspecified site: Secondary | ICD-10-CM | POA: Diagnosis not present

## 2016-06-28 DIAGNOSIS — Z951 Presence of aortocoronary bypass graft: Secondary | ICD-10-CM | POA: Diagnosis not present

## 2016-06-28 DIAGNOSIS — Z7952 Long term (current) use of systemic steroids: Secondary | ICD-10-CM | POA: Diagnosis not present

## 2016-06-28 DIAGNOSIS — I5041 Acute combined systolic (congestive) and diastolic (congestive) heart failure: Secondary | ICD-10-CM | POA: Diagnosis not present

## 2016-06-28 DIAGNOSIS — J449 Chronic obstructive pulmonary disease, unspecified: Secondary | ICD-10-CM | POA: Diagnosis not present

## 2016-06-28 DIAGNOSIS — Z7982 Long term (current) use of aspirin: Secondary | ICD-10-CM | POA: Diagnosis not present

## 2016-06-28 DIAGNOSIS — I4891 Unspecified atrial fibrillation: Secondary | ICD-10-CM | POA: Diagnosis not present

## 2016-06-28 DIAGNOSIS — Z794 Long term (current) use of insulin: Secondary | ICD-10-CM | POA: Diagnosis not present

## 2016-06-28 DIAGNOSIS — N181 Chronic kidney disease, stage 1: Secondary | ICD-10-CM | POA: Diagnosis not present

## 2016-06-28 DIAGNOSIS — E1122 Type 2 diabetes mellitus with diabetic chronic kidney disease: Secondary | ICD-10-CM | POA: Diagnosis not present

## 2016-06-28 DIAGNOSIS — Z7901 Long term (current) use of anticoagulants: Secondary | ICD-10-CM | POA: Diagnosis not present

## 2016-06-28 DIAGNOSIS — I130 Hypertensive heart and chronic kidney disease with heart failure and stage 1 through stage 4 chronic kidney disease, or unspecified chronic kidney disease: Secondary | ICD-10-CM | POA: Diagnosis not present

## 2016-06-28 DIAGNOSIS — I2510 Atherosclerotic heart disease of native coronary artery without angina pectoris: Secondary | ICD-10-CM | POA: Diagnosis not present

## 2016-06-29 ENCOUNTER — Ambulatory Visit (INDEPENDENT_AMBULATORY_CARE_PROVIDER_SITE_OTHER): Payer: Medicare Other | Admitting: Family Medicine

## 2016-06-29 ENCOUNTER — Encounter: Payer: Self-pay | Admitting: Family Medicine

## 2016-06-29 VITALS — BP 114/53 | HR 60 | Temp 98.1°F | Resp 16 | Ht 71.0 in | Wt 234.0 lb

## 2016-06-29 DIAGNOSIS — J441 Chronic obstructive pulmonary disease with (acute) exacerbation: Secondary | ICD-10-CM

## 2016-06-29 DIAGNOSIS — J069 Acute upper respiratory infection, unspecified: Secondary | ICD-10-CM

## 2016-06-29 MED ORDER — AZITHROMYCIN 250 MG PO TABS: ORAL_TABLET | ORAL | 0 refills | Status: DC

## 2016-06-29 MED ORDER — GABAPENTIN 100 MG PO CAPS: ORAL_CAPSULE | ORAL | 6 refills | Status: DC

## 2016-06-29 MED ORDER — PREDNISONE 20 MG PO TABS: ORAL_TABLET | ORAL | 0 refills | Status: DC

## 2016-06-29 NOTE — Progress Notes (Signed)
OFFICE VISIT  06/29/2016   CC:  Chief Complaint  Patient presents with  . URI    x 4 days    HPI:    Patient is a 76 y.o. Caucasian male who presents for onset 4 d/a of cough, runny nose, wheezing. Gets a little dark mucous when he coughs.  No SOB.  No fever.  A little ST.  No HA. Meds for this: mucinex plain.  Has had to take proair the same as prior to the cold. No change in mild LE pitting edema.  No feeling of increased abd girth.  He has had no change in oxygen requirements at home: 88-92% off oxygen.  Consistently 94% or greater with 2 and 3/4 L oxygen.  Has no change in chronic DOE.  He has generalized chronic fatigue/debilitation: has been hospitalized x 2 recently--once for chf/copd/resp failure and once for dehydration. Spent 9d in SNF after this, just recently returned home.    He just followed up with pulmonary MD 06/24/16 and was stable: told that oxygen sat goal is 88-92%, continue symbicort 2 p bid.  ROS: no melena or hematochezia, no CP, no palpitations, no dizziness.  PO intake down/lack of appetite with this illness.    Past Medical History:  Diagnosis Date  . Acute respiratory distress 06/04/2016  . Adenomatous colon polyp 10/16/2011   Repeat 2018  . Arthritis     Hips, R>L & KNEES  . CAD, multiple vessel    3V CAD cath 08/08/13----CABG done shortly after.  . 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 pylori neg, no dysplasia or malignancy.  . Chronic diastolic heart failure ([place]) 2016  . Chronic renal insufficiency, stage II (mild)   . COPD (chronic obstructive pulmonary disease) ([place])    GOLD II.  Spirometry  2004 borderline obstruction; 2015 mod obst: noncompliant with bid symbicort so pulmonologist switched him to a once daily inhaler: Trelegy ellipta 12/2015.  . Diabetic nephropathy ([place])    Elevated urine microalb/cr 03/2011  . DM type 2 (diabetes mellitus, type 2) ([place])    Poor control on max oral meds--pt eventually  agreed to insulin therapy.  As of 2017 his DM is being managed by Dr. Dwyane Dee in endo.  . DOE (dyspnea on exertion)    COPD + chronic diastolic HF  . Erectile dysfunction    Normal testosterone  . Hearing loss of both ears 2016   Hearing aids  . Hyperlipidemia   . Hyperplastic colon polyp 2001  . Hypertension   . Iron deficiency anemia 2014   03/2012 capsule endoscopy showed 2 AVMs--likely responsible for his IDA--lifetime iron supp recommended + q73moCBCs.  . Macular degeneration, dry    Mild, bilat (Optometrist, [place]  . Obesity   . Open toe wound 11/2015   Wound care clinic appt made and then canceled when toe improved.  . Other and unspecified angina pectoris   . PAD (peripheral artery disease) ([place] 02/2016   Abnormal ABI's and waveforms: LE arterial duplex ordered for f/u as per cardiologist's recommendation.  Vasc eval by Dr. CBridgett Larsson impression was minimal PAD, recommended maximize med mgmt.  .Marland KitchenPAF (paroxysmal atrial fibrillation) ([place] 10/2014   xarelto  . Pericardial effusion with cardiac tamponade 6//29/15   pericardiocentesis was done,  Infectious/inflamm (cytology showed NO MALIGNANT CELLS)  . Pleural plaque    asbestos exposure x 2 yrs: Dr. MVaughan Brownerordered CT chest for further eval on  02/2016  . Pneumonia   . Tobacco dependence in remission    100+ pack-yr hx: quit after CABG    Past Surgical History:  Procedure Laterality Date  . APPENDECTOMY  1957  . CARDIAC CATHETERIZATION  08/08/2013  . CATARACT EXTRACTION W/ INTRAOCULAR LENS  IMPLANT, BILATERAL  04/08/2006 & 04/22/2006  . CATARACT EXTRACTION W/ INTRAOCULAR LENS  IMPLANT, BILATERAL Bilateral   . CHOLECYSTECTOMY OPEN  1999  . COLONOSCOPY  10/16/2011   Procedure: COLONOSCOPY;  Surgeon: Inda Castle, MD;  Location: [facility];  Service: Endoscopy;  Laterality: N/A;  . CORONARY ARTERY BYPASS GRAFT N/A 08/14/2013   Procedure: CORONARY ARTERY BYPASS GRAFTING (CABG);   Surgeon: Melrose Nakayama, MD;  Location: [facility];  Service: Open Heart Surgery;  Laterality: N/A;  Times 4   using left internal mammary artery and endoscopically harvested bilateral saphenous vein  . ESOPHAGOGASTRODUODENOSCOPY  02/25/12   Atrophic gastritis with a few erosions--capsule endoscopy planned as of 02/25/12 (Dr. Deatra Ina).  [name] HARDWARE REMOVAL Right 08/12/2012   Procedure: HARDWARE REMOVAL;  Surgeon: Mcarthur Rossetti, MD;  Location: [facility];  Service: Orthopedics;  Laterality: Right;  . HIP SURGERY Right 1954   Repair of slipped capital femoral epiphysis.  . INTRAOPERATIVE TRANSESOPHAGEAL ECHOCARDIOGRAM N/A 08/14/2013   Normal LV function. Procedure: INTRAOPERATIVE TRANSESOPHAGEAL ECHOCARDIOGRAM;  Surgeon: Melrose Nakayama, MD;  Location: [facility];  Service: Open Heart Surgery;  Laterality: N/A;  . LEFT HEART CATHETERIZATION WITH CORONARY ANGIOGRAM N/A 08/08/2013   Procedure: LEFT HEART CATHETERIZATION WITH CORONARY ANGIOGRAM;  Surgeon: Jettie Booze, MD;  Location: [facility] C[facility] LAB;  Service: Cardiovascular;  Laterality: N/A;  . PATELLA FRACTURE SURGERY Left ~ 1979   bolt + 3 screws to repair tib plateau fx  . PERICARDIAL TAP N/A 07/01/2013   Procedure: PERICARDIAL TAP;  Surgeon: Jettie Booze, MD;  Location: [facility] CATH LAB;  Service: Cardiovascular;  Laterality: N/A;  . RIGHT/LEFT HEART CATH AND CORONARY ANGIOGRAPHY N/A 06/10/2016   Procedure: Right/Left Heart Cath and Coronary Angiography;  Surgeon: Larey Dresser, MD;  Location: [facility] CV LAB;  Service: Cardiovascular;  Laterality: N/A;  . TESTICLE SURGERY  as a child   Undescended testicle brought down into scrotum  . TONSILLECTOMY  1947  . TOTAL HIP ARTHROPLASTY Right 08/12/2012   Procedure: REMOVAL OF OLD PINS RIGHT HIP AND RIGHT TOTAL HIP ARTHROPLASTY ANTERIOR APPROACH;  Surgeon: Mcarthur Rossetti, MD;  Location: [facility];  Service: Orthopedics;  Laterality: Right;  . TRANSTHORACIC ECHOCARDIOGRAM  10/30/14   Mod  LVH, EF 60-65%, normal wall motion, mod mitral regurg, mild PAH    Outpatient Medications Prior to Visit  Medication Sig Dispense Refill  . acetaminophen (TYLENOL) 325 MG tablet Take 2 tablets (650 mg total) by mouth every 4 (four) hours as needed for headache or mild pain.    . Ascorbic Acid (VITAMIN C) 1000 MG tablet Take 1,000 mg by mouth daily.    [name] aspirin 81 MG tablet Take 81 mg by mouth at bedtime. Reported on 02/26/2015    . atorvastatin (LIPITOR) 40 MG tablet TAKE ONE TABLET BY MOUTH EVERY DAY 30 tablet 6  . bisoprolol (ZEBETA) 5 MG tablet Take 1 tablet (5 mg total) by mouth daily. 30 tablet 6  . budesonide-formoterol (SYMBICORT) 160-4.5 MCG/ACT inhaler Inhale 2 puffs into the lungs 2 (two) times daily. 1 Inhaler 12  . buPROPion (WELLBUTRIN SR) 150 MG 12 hr tablet TAKE ONE TABLET BY MOUTH TWICE DAILY 60 tablet 6  . Cholecalciferol (  VITAMIN D-3) 1000 units CAPS Take 1,000 Units by mouth daily. Reported on 02/26/2015    . Chromium Picolinate 1000 MCG TABS Take 1,000 mcg by mouth daily. Reported on 02/26/2015    . citalopram (CELEXA) 20 MG tablet Take 1 tablet (20 mg total) by mouth daily. 30 tablet 2  . digoxin (LANOXIN) 0.125 MG tablet Take 1 tablet (0.125 mg total) by mouth daily. 30 tablet 6  . fluticasone (FLONASE) 50 MCG/ACT nasal spray Place 2 sprays into both nostrils daily. 16 g 1  . guaiFENesin (MUCINEX) 600 MG 12 hr tablet Take 2 tablets (1,200 mg total) by mouth 2 (two) times daily. 30 tablet 0  . Insulin Glargine (TOUJEO SOLOSTAR) 300 UNIT/ML SOPN Inject 70 Units into the skin daily. 6 pen 1  . insulin regular (NOVOLIN R,HUMULIN R) 100 units/mL injection Inject 16 units SQ with BF, 17 units SQ with Lunch, and 18 U SQ with Supper (Patient taking differently: 16 units in the am, 17 at lunch and 18 at bedtime) 1 vial 6  . isosorbide mononitrate (IMDUR) 30 MG 24 hr tablet TAKE ONE TABLET BY MOUTH DAILY 90 tablet 1  . Multiple Vitamin (MULTIVITAMIN WITH MINERALS) TABS Take 1 tablet by  mouth daily.    . nitroGLYCERIN (NITROSTAT) 0.4 MG SL tablet Place 1 tablet (0.4 mg total) under the tongue every 5 (five) minutes as needed for chest pain. 25 tablet 3  . potassium chloride SA (K-DUR,KLOR-CON) 20 MEQ tablet Take 1 tablet (20 mEq total) by mouth daily. 30 tablet 6  . PROAIR HFA 108 (90 Base) MCG/ACT inhaler INHALE 2 PUFFS INTO THE LUNGS EVERY 4 HOURS AS NEEDED FOR WHEEZING ORSHORTNESS OF BREATH 8.5 g 1  . rivaroxaban (XARELTO) 20 MG TABS tablet Take 1 tablet (20 mg total) by mouth daily with supper. 90 tablet 1  . spironolactone (ALDACTONE) 25 MG tablet Take 1 tablet (25 mg total) by mouth daily. 30 tablet 6  . SYNJARDY 05-998 MG TABS TAKE ONE TABLET BY MOUTH TWICE DAILY AFTER A MEAL 60 tablet 3  . tamsulosin (FLOMAX) 0.4 MG CAPS capsule TAKE 1 CAPSULE BY MOUTH EVERY DAY AFTER SUPPER 90 capsule 1  . torsemide (DEMADEX) 20 MG tablet Take 2 tablets (40 mg total) by mouth daily. 60 tablet 6  . gabapentin (NEURONTIN) 100 MG capsule TAKE 2 CAPSULES BY MOUTH 3 TIMES DAILY (Patient taking differently: Take two capsules three times daily) 180 capsule 6   No facility-administered medications prior to visit.     Allergies  Allergen Reactions  . Morphine And Related Other (See Comments)    Drenched with perspiration  . Demerol [Meperidine] Nausea Only  . Starlix [Nateglinide] Other (See Comments)    gassy    ROS As per HPI  PE: Blood pressure (!) 114/53, pulse 60, temperature 98.1 F (36.7 C), temperature source Oral, resp. rate 16, height 5' 11" (1.803 m), weight 234 lb (106.1 kg), SpO2 91 %.RA Gen: alert, chronically ill-appearing, NAD.  Oriented x 4. AFFECT: pleasant, lucid thought and speech. CNH:LPYJ: no injection, icteris, swelling, or exudate.  EOMI, PERRLA. Mouth: lips without lesion/swelling.  Oral mucosa pink and moist. Oropharynx without erythema, exudate, or swelling.  CV: RRR, no m/r/g.   LUNGS: CTA bilat, nonlabored resps, good aeration in all lung fields. EXT:  no clubbing or cyanosis.  He has 2+ pitting on L LL and 1+ pitting on R LL.   LABS:    Chemistry      Component Value Date/Time  NA 137 06/19/2016 1138   K 4.3 06/19/2016 1138   CL 96 (L) 06/19/2016 1138   CO2 30 06/19/2016 1138   BUN 33 (H) 06/19/2016 1138   CREATININE 1.41 (H) 06/19/2016 1138   CREATININE 1.01 10/31/2015 1229      Component Value Date/Time   CALCIUM 8.4 (L) 06/19/2016 1138   ALKPHOS 82 06/15/2016 1913   AST 27 06/15/2016 1913   ALT 32 06/15/2016 1913   BILITOT 0.9 06/15/2016 1913     Lab Results  Component Value Date   WBC 11.5 (H) 06/16/2016   HGB 13.7 06/16/2016   HCT 45.6 06/16/2016   MCV 85.9 06/16/2016   PLT 183 06/16/2016     IMPRESSION AND PLAN:  URI with COPD exacerbation--early in its course. No sign of volume overload/CHF. Will start prednisone: 63m qd x 2d, then 440mqd x 5d, then 2010md x 5d. Z-pack. No change in chronic meds or supplemental oxygen plan at this time. No labs or imaging today.  An After Visit Summary was printed and given to the patient.  FOLLOW UP: Return in about 2 days (around 07/01/2016) for f/u COPD exac.  Signed:  PhiCrissie SicklesD           06/29/2016

## 2016-06-30 DIAGNOSIS — Z794 Long term (current) use of insulin: Secondary | ICD-10-CM | POA: Diagnosis not present

## 2016-06-30 DIAGNOSIS — Z7982 Long term (current) use of aspirin: Secondary | ICD-10-CM | POA: Diagnosis not present

## 2016-06-30 DIAGNOSIS — I4891 Unspecified atrial fibrillation: Secondary | ICD-10-CM | POA: Diagnosis not present

## 2016-06-30 DIAGNOSIS — Z7952 Long term (current) use of systemic steroids: Secondary | ICD-10-CM | POA: Diagnosis not present

## 2016-06-30 DIAGNOSIS — I2510 Atherosclerotic heart disease of native coronary artery without angina pectoris: Secondary | ICD-10-CM | POA: Diagnosis not present

## 2016-06-30 DIAGNOSIS — J449 Chronic obstructive pulmonary disease, unspecified: Secondary | ICD-10-CM | POA: Diagnosis not present

## 2016-06-30 DIAGNOSIS — I130 Hypertensive heart and chronic kidney disease with heart failure and stage 1 through stage 4 chronic kidney disease, or unspecified chronic kidney disease: Secondary | ICD-10-CM | POA: Diagnosis not present

## 2016-06-30 DIAGNOSIS — E1122 Type 2 diabetes mellitus with diabetic chronic kidney disease: Secondary | ICD-10-CM | POA: Diagnosis not present

## 2016-06-30 DIAGNOSIS — Z951 Presence of aortocoronary bypass graft: Secondary | ICD-10-CM | POA: Diagnosis not present

## 2016-06-30 DIAGNOSIS — I5041 Acute combined systolic (congestive) and diastolic (congestive) heart failure: Secondary | ICD-10-CM | POA: Diagnosis not present

## 2016-06-30 DIAGNOSIS — N181 Chronic kidney disease, stage 1: Secondary | ICD-10-CM | POA: Diagnosis not present

## 2016-06-30 DIAGNOSIS — Z7901 Long term (current) use of anticoagulants: Secondary | ICD-10-CM | POA: Diagnosis not present

## 2016-06-30 DIAGNOSIS — M1991 Primary osteoarthritis, unspecified site: Secondary | ICD-10-CM | POA: Diagnosis not present

## 2016-07-01 ENCOUNTER — Other Ambulatory Visit: Payer: Self-pay | Admitting: *Deleted

## 2016-07-01 ENCOUNTER — Ambulatory Visit: Payer: Medicare Other | Admitting: Family Medicine

## 2016-07-01 NOTE — Patient Outreach (Signed)
[facility]) Care Management  07/01/2016  Joshua [name] [birth year] [id number]   CSW called & spoke with patient & his wife, Joshua [name] via phone (home#: [phone]) to complete initial assessments & follow-up on discharge from [facility]. Wife reports that patient did discharge home last Friday, 06/26/16 and home health came out on Sunday. CSW sent message to [facility], Thea Silversmith to make aware of patient's return home. Patient's wife reports that patient did go to his PCP - Dr. Idelle Leech on Monday as he had early signs of a URI - he was prescribed prednisone & a z-pack, which wife reports he has been taking. Patient states that he feels much better & is glad to be home.   CSW will follow-up in 2 weeks and if no further social work concerns, will likely close case.    Raynaldo Opitz, LCSW [facility]  Clinical Social Worker cell #: [phone]

## 2016-07-02 DIAGNOSIS — I5041 Acute combined systolic (congestive) and diastolic (congestive) heart failure: Secondary | ICD-10-CM | POA: Diagnosis not present

## 2016-07-02 DIAGNOSIS — M1991 Primary osteoarthritis, unspecified site: Secondary | ICD-10-CM | POA: Diagnosis not present

## 2016-07-02 DIAGNOSIS — I130 Hypertensive heart and chronic kidney disease with heart failure and stage 1 through stage 4 chronic kidney disease, or unspecified chronic kidney disease: Secondary | ICD-10-CM | POA: Diagnosis not present

## 2016-07-02 DIAGNOSIS — Z7982 Long term (current) use of aspirin: Secondary | ICD-10-CM | POA: Diagnosis not present

## 2016-07-02 DIAGNOSIS — I4891 Unspecified atrial fibrillation: Secondary | ICD-10-CM | POA: Diagnosis not present

## 2016-07-02 DIAGNOSIS — N181 Chronic kidney disease, stage 1: Secondary | ICD-10-CM | POA: Diagnosis not present

## 2016-07-02 DIAGNOSIS — J449 Chronic obstructive pulmonary disease, unspecified: Secondary | ICD-10-CM | POA: Diagnosis not present

## 2016-07-02 DIAGNOSIS — E1122 Type 2 diabetes mellitus with diabetic chronic kidney disease: Secondary | ICD-10-CM | POA: Diagnosis not present

## 2016-07-02 DIAGNOSIS — Z7952 Long term (current) use of systemic steroids: Secondary | ICD-10-CM | POA: Diagnosis not present

## 2016-07-02 DIAGNOSIS — I2510 Atherosclerotic heart disease of native coronary artery without angina pectoris: Secondary | ICD-10-CM | POA: Diagnosis not present

## 2016-07-02 DIAGNOSIS — Z794 Long term (current) use of insulin: Secondary | ICD-10-CM | POA: Diagnosis not present

## 2016-07-02 DIAGNOSIS — Z951 Presence of aortocoronary bypass graft: Secondary | ICD-10-CM | POA: Diagnosis not present

## 2016-07-02 DIAGNOSIS — Z7901 Long term (current) use of anticoagulants: Secondary | ICD-10-CM | POA: Diagnosis not present

## 2016-07-03 ENCOUNTER — Other Ambulatory Visit (HOSPITAL_COMMUNITY): Payer: Self-pay | Admitting: Cardiology

## 2016-07-06 ENCOUNTER — Other Ambulatory Visit: Payer: Self-pay | Admitting: Endocrinology

## 2016-07-06 ENCOUNTER — Other Ambulatory Visit: Payer: Self-pay

## 2016-07-06 DIAGNOSIS — N181 Chronic kidney disease, stage 1: Secondary | ICD-10-CM | POA: Diagnosis not present

## 2016-07-06 DIAGNOSIS — J449 Chronic obstructive pulmonary disease, unspecified: Secondary | ICD-10-CM | POA: Diagnosis not present

## 2016-07-06 DIAGNOSIS — I2510 Atherosclerotic heart disease of native coronary artery without angina pectoris: Secondary | ICD-10-CM | POA: Diagnosis not present

## 2016-07-06 DIAGNOSIS — I5041 Acute combined systolic (congestive) and diastolic (congestive) heart failure: Secondary | ICD-10-CM | POA: Diagnosis not present

## 2016-07-06 DIAGNOSIS — E1122 Type 2 diabetes mellitus with diabetic chronic kidney disease: Secondary | ICD-10-CM | POA: Diagnosis not present

## 2016-07-06 DIAGNOSIS — I4891 Unspecified atrial fibrillation: Secondary | ICD-10-CM | POA: Diagnosis not present

## 2016-07-06 DIAGNOSIS — I130 Hypertensive heart and chronic kidney disease with heart failure and stage 1 through stage 4 chronic kidney disease, or unspecified chronic kidney disease: Secondary | ICD-10-CM | POA: Diagnosis not present

## 2016-07-06 DIAGNOSIS — M1991 Primary osteoarthritis, unspecified site: Secondary | ICD-10-CM | POA: Diagnosis not present

## 2016-07-06 DIAGNOSIS — Z7901 Long term (current) use of anticoagulants: Secondary | ICD-10-CM | POA: Diagnosis not present

## 2016-07-06 DIAGNOSIS — Z7982 Long term (current) use of aspirin: Secondary | ICD-10-CM | POA: Diagnosis not present

## 2016-07-06 DIAGNOSIS — Z951 Presence of aortocoronary bypass graft: Secondary | ICD-10-CM | POA: Diagnosis not present

## 2016-07-06 DIAGNOSIS — Z794 Long term (current) use of insulin: Secondary | ICD-10-CM | POA: Diagnosis not present

## 2016-07-06 DIAGNOSIS — Z7952 Long term (current) use of systemic steroids: Secondary | ICD-10-CM | POA: Diagnosis not present

## 2016-07-06 NOTE — Progress Notes (Signed)
Patient ID: Joshua [name], male   DOB: 1940-12-09, 76 y.o.   MRN: [id number]            Reason for Appointment:  Follow-up for Type 2 Diabetes  Referring physician: [place]   History of Present Illness:          Date of diagnosis of type 2 diabetes mellitus: ?  2002        Background history:   He apparently had been treated with metformin initially which was continued until about 2016. He previously has also been tried on Amaryl, Actos, Farxiga and Victoza but he claims that these did not help his blood sugar and were stopped Appears to have been on insulin since about 2014 but he does not remember details. He has been taking mostly Levemir insulin as a basal insulin but was also on Lantus in 2015 for some time His best A1c has been 7.3, once in 2014 and another time in 2016, otherwise his A1c has been higher and as high as 10.8  Recent history:   INSULIN regimen is:   Toujeo 70 units in a.m., Novolin R 16-18 units before meals tid   Non-insulin hypoglycemic drugs the patient is taking are: Synjardy 05/998 twice a day  His last A1c was 10.4 last year and is now 7.3 in 5/18  Current management, blood sugar patterns and problems identified:  He has not been checking his blood sugars much especially with his being in hospital and rehabilitation  Again he is checking mostly fasting readings and difficult get a good pattern  His blood sugars have been quite variable with fasting readings as low as 67, 3 weeks ago  However he had decreased appetite until about a few days ago and his blood sugars are now increasing significantly  With this he has lost weight  Apparently his regular insulin was reduced significantly after his hospitalization for CHF and dehydration  The last 3 readings AFTER his evening meal are over 300 now and he does not think he is going off his diet except with eating a couple of scoops of ice cream periodically, not eating much fried food or drinking  sweetened drinks lately  He does confirm that he is trying to take his regular insulin 15-30 minutes before eating  However he was supposed as resistant NovoLog and he still has not done so  FASTING readings are somewhat variable but improving the last 3 days, have been as high as 242  Unable to exercise because of various physical limitations       Side effects from medications have been: None  Compliance with the medical regimen: Fair Hypoglycemia: Never    Glucose monitoring:  done  times a day         Glucometer:  Accu-Chek .      Blood Glucose readings with averages from download as below  Mean values apply above for all meters except median for One Touch  PRE-MEAL Fasting Lunch Dinner Bedtime Overall  Glucose range: 67-242   1 47-226   1 11-360     Mean/median:     182+/-94    POST-MEAL PC Breakfast PC Lunch PC Dinner  Glucose range:   99-3 98   Mean/median:      Self-care: The diet that the patient has been following is: tries to limit Sweet drinks .     Meal times are:  Breakfast is at 7 a.m.  Dinner: 5-6 PM   Typical meal intake: Breakfast  is usually eggs,  Sausage, oatmeal or grits.  Lunch is a sandwich, any evening he has meat and vegetables, For snacks he will have fruit or crackers                Dietician visit, most recent: 01/09/16 CDE visit: 12/18               Exercise: Unable to do any     Weight history:  Wt Readings from Last 3 Encounters:  07/07/16 227 lb 6.4 oz (103.1 kg)  06/29/16 234 lb (106.1 kg)  06/24/16 240 lb 6.4 oz (109 kg)    Glycemic control:   Lab Results  Component Value Date   HGBA1C 7.3 (H) 06/04/2016   HGBA1C 7.5 05/06/2016   HGBA1C 10.4 (H) 11/21/2015   Lab Results  Component Value Date   MICROALBUR 4.6 (H) 12/17/2014   LDLCALC 44 02/19/2016   CREATININE 1.41 (H) 06/19/2016   Lab Results  Component Value Date   MICRALBCREAT 6.4 12/17/2014    Other active problems: See review of systems   Allergies as of 07/07/2016       Reactions   Morphine And Related Other (See Comments)   Drenched with perspiration   Demerol [meperidine] Nausea Only   Starlix [nateglinide] Other (See Comments)   gassy      Medication List       Accurate as of 07/07/16  9:08 AM. Always use your most recent med list.          acetaminophen 325 MG tablet Commonly known as:  TYLENOL Take 2 tablets (650 mg total) by mouth every 4 (four) hours as needed for headache or mild pain.   aspirin 81 MG tablet Take 81 mg by mouth at bedtime. Reported on 02/26/2015   atorvastatin 40 MG tablet Commonly known as:  LIPITOR TAKE ONE TABLET BY MOUTH EVERY DAY   bisoprolol 5 MG tablet Commonly known as:  ZEBETA Take 1 tablet (5 mg total) by mouth daily.   budesonide-formoterol 160-4.5 MCG/ACT inhaler Commonly known as:  SYMBICORT Inhale 2 puffs into the lungs 2 (two) times daily.   buPROPion 150 MG 12 hr tablet Commonly known as:  WELLBUTRIN SR TAKE ONE TABLET BY MOUTH TWICE DAILY   Chromium Picolinate 1000 MCG Tabs Take 1,000 mcg by mouth daily. Reported on 02/26/2015   citalopram 20 MG tablet Commonly known as:  CELEXA Take 1 tablet (20 mg total) by mouth daily.   digoxin 0.125 MG tablet Commonly known as:  LANOXIN Take 1 tablet (0.125 mg total) by mouth daily.   fluticasone 50 MCG/ACT nasal spray Commonly known as:  FLONASE Place 2 sprays into both nostrils daily.   gabapentin 100 MG capsule Commonly known as:  NEURONTIN Take two capsules three times daily   guaiFENesin 600 MG 12 hr tablet Commonly known as:  MUCINEX Take 2 tablets (1,200 mg total) by mouth 2 (two) times daily.   Insulin Glargine 300 UNIT/ML Sopn Commonly known as:  TOUJEO SOLOSTAR Inject 70 Units into the skin daily.   insulin regular 100 units/mL injection Commonly known as:  NOVOLIN R,HUMULIN R Inject 16 units SQ with BF, 17 units SQ with Lunch, and 18 U SQ with Supper   isosorbide mononitrate 30 MG 24 hr tablet Commonly known as:   IMDUR TAKE ONE TABLET BY MOUTH DAILY   multivitamin with minerals Tabs tablet Take 1 tablet by mouth daily.   nitroGLYCERIN 0.4 MG SL tablet Commonly known as:  NITROSTAT Place  1 tablet (0.4 mg total) under the tongue every 5 (five) minutes as needed for chest pain.   potassium chloride SA 20 MEQ tablet Commonly known as:  K-DUR,KLOR-CON Take 1 tablet (20 mEq total) by mouth daily.   PROAIR HFA 108 (90 Base) MCG/ACT inhaler Generic drug:  albuterol INHALE 2 PUFFS INTO THE LUNGS EVERY 4 HOURS AS NEEDED FOR WHEEZING ORSHORTNESS OF BREATH   rivaroxaban 20 MG Tabs tablet Commonly known as:  XARELTO Take 1 tablet (20 mg total) by mouth daily with supper.   spironolactone 25 MG tablet Commonly known as:  ALDACTONE Take 1 tablet (25 mg total) by mouth daily.   SYNJARDY 05-998 MG Tabs Generic drug:  Empagliflozin-Metformin HCl TAKE ONE TABLET BY MOUTH TWICE DAILY AFTER A MEAL   tamsulosin 0.4 MG Caps capsule Commonly known as:  FLOMAX TAKE 1 CAPSULE BY MOUTH EVERY DAY AFTER SUPPER   torsemide 20 MG tablet Commonly known as:  DEMADEX Take 2 tablets (40 mg total) by mouth daily.   vitamin C 1000 MG tablet Take 1,000 mg by mouth daily.   Vitamin D-3 1000 units Caps Take 1,000 Units by mouth daily. Reported on 02/26/2015       Allergies:  Allergies  Allergen Reactions  . Morphine And Related Other (See Comments)    Drenched with perspiration  . Demerol [Meperidine] Nausea Only  . Starlix [Nateglinide] Other (See Comments)    gassy    Past Medical History:  Diagnosis Date  . Acute respiratory distress 06/04/2016  . Adenomatous colon polyp 10/16/2011   Repeat 2018  . Arthritis     Hips, R>L & KNEES  . CAD, multiple vessel    3V CAD cath 08/08/13----CABG done shortly after.  . 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 pylori neg, no dysplasia or malignancy.  . Chronic diastolic heart failure ([place]) 2016  . Chronic renal  insufficiency, stage II (mild)   . COPD (chronic obstructive pulmonary disease) ([place])    GOLD II.  Spirometry  2004 borderline obstruction; 2015 mod obst: noncompliant with bid symbicort so pulmonologist switched him to a once daily inhaler: Trelegy ellipta 12/2015.  . Diabetic nephropathy ([place])    Elevated urine microalb/cr 03/2011  . DM type 2 (diabetes mellitus, type 2) ([place])    Poor control on max oral meds--pt eventually agreed to insulin therapy.  As of 2017 his DM is being managed by Dr. Dwyane Dee in endo.  . DOE (dyspnea on exertion)    COPD + chronic diastolic HF  . Erectile dysfunction    Normal testosterone  . Hearing loss of both ears 2016   Hearing aids  . Hyperlipidemia   . Hyperplastic colon polyp 2001  . Hypertension   . Iron deficiency anemia 2014   03/2012 capsule endoscopy showed 2 AVMs--likely responsible for his IDA--lifetime iron supp recommended + q54moCBCs.  . Macular degeneration, dry    Mild, bilat (Optometrist, [place]ford Knife[place] [employer] [place]  . Obesity   . Open toe wound 11/2015   Wound care clinic appt made and then canceled when toe improved.  . Other and unspecified angina pectoris   . PAD (peripheral artery disease) ([place] 02/2016   Abnormal ABI's and waveforms: LE arterial duplex ordered for f/u as per cardiologist's recommendation.  Vasc eval by Dr. CBridgett Larsson impression was minimal PAD, recommended maximize med mgmt.  .Marland KitchenPAF (paroxysmal atrial fibrillation) ([place] 10/2014   xarelto  . Pericardial effusion with  cardiac tamponade 6//29/15   pericardiocentesis was done,  Infectious/inflamm (cytology showed NO MALIGNANT CELLS)  . Pleural plaque    asbestos exposure x 2 yrs: Dr. Vaughan Browner ordered CT chest for further eval on 02/2016  . Pneumonia   . Tobacco dependence in remission    100+ pack-yr hx: quit after CABG    Past Surgical History:  Procedure Laterality Date  . APPENDECTOMY  1957  . CARDIAC CATHETERIZATION  08/08/2013  . CATARACT  EXTRACTION W/ INTRAOCULAR LENS  IMPLANT, BILATERAL  04/08/2006 & 04/22/2006  . CATARACT EXTRACTION W/ INTRAOCULAR LENS  IMPLANT, BILATERAL Bilateral   . CHOLECYSTECTOMY OPEN  1999  . COLONOSCOPY  10/16/2011   Procedure: COLONOSCOPY;  Surgeon: Inda Castle, MD;  Location: [facility];  Service: Endoscopy;  Laterality: N/A;  . CORONARY ARTERY BYPASS GRAFT N/A 08/14/2013   Procedure: CORONARY ARTERY BYPASS GRAFTING (CABG);  Surgeon: Melrose Nakayama, MD;  Location: [facility];  Service: Open Heart Surgery;  Laterality: N/A;  Times 4   using left internal mammary artery and endoscopically harvested bilateral saphenous vein  . ESOPHAGOGASTRODUODENOSCOPY  02/25/12   Atrophic gastritis with a few erosions--capsule endoscopy planned as of 02/25/12 (Dr. Deatra Ina).  [name] HARDWARE REMOVAL Right 08/12/2012   Procedure: HARDWARE REMOVAL;  Surgeon: Mcarthur Rossetti, MD;  Location: [facility];  Service: Orthopedics;  Laterality: Right;  . HIP SURGERY Right 1954   Repair of slipped capital femoral epiphysis.  . INTRAOPERATIVE TRANSESOPHAGEAL ECHOCARDIOGRAM N/A 08/14/2013   Normal LV function. Procedure: INTRAOPERATIVE TRANSESOPHAGEAL ECHOCARDIOGRAM;  Surgeon: Melrose Nakayama, MD;  Location: [facility];  Service: Open Heart Surgery;  Laterality: N/A;  . LEFT HEART CATHETERIZATION WITH CORONARY ANGIOGRAM N/A 08/08/2013   Procedure: LEFT HEART CATHETERIZATION WITH CORONARY ANGIOGRAM;  Surgeon: Jettie Booze, MD;  [facility];  Service: Cardiovascular;  Laterality: N/A;  . PATELLA FRACTURE SURGERY Left ~ 1979   bolt + 3 screws to repair tib plateau fx  . PERICARDIAL TAP N/A 07/01/2013   Procedure: PERICARDIAL TAP;  Surgeon: Jettie Booze, MD;  Location: [facility] CATH LAB;  Service: Cardiovascular;  Laterality: N/A;  . RIGHT/LEFT HEART CATH AND CORONARY ANGIOGRAPHY N/A 06/10/2016   Procedure: Right/Left Heart Cath and Coronary Angiography;  Surgeon: Larey Dresser, MD;  Location: [facility] CV LAB;  Service:  Cardiovascular;  Laterality: N/A;  . TESTICLE SURGERY  as a child   Undescended testicle brought down into scrotum  . TONSILLECTOMY  1947  . TOTAL HIP ARTHROPLASTY Right 08/12/2012   Procedure: REMOVAL OF OLD PINS RIGHT HIP AND RIGHT TOTAL HIP ARTHROPLASTY ANTERIOR APPROACH;  Surgeon: Mcarthur Rossetti, MD;  Location: [facility];  Service: Orthopedics;  Laterality: Right;  . TRANSTHORACIC ECHOCARDIOGRAM  10/30/14   Mod LVH, EF 60-65%, normal wall motion, mod mitral regurg, mild PAH    Family History  Problem Relation Age of Onset  . Heart disease Father   . Heart attack Father   . CVA Mother   . Hypertension Mother   . Diabetes Paternal Grandmother   . Breast cancer Sister   . Diabetes Maternal Uncle   . Heart disease Maternal Uncle   . Stroke Neg Hx     Social History:  reports that he quit smoking about 8 months ago. His smoking use included Cigarettes. He has a 15.50 pack-year smoking history. He quit smokeless tobacco use about 3 years ago. He reports that he does not drink alcohol or use drugs.   Review of Systems   Lipid  history: He had been prescribed Lipitor 40 mg With adequate control    Lab Results  Component Value Date   CHOL 119 02/19/2016   HDL 43.30 02/19/2016   LDLCALC 44 02/19/2016   LDLDIRECT 187.0 11/21/2015   TRIG 159.0 (H) 02/19/2016   CHOLHDL 3 02/19/2016           Hypertension:Currently treated with bisoprolol 5 mg, Also on diuretics for CHF Only when he is getting up quickly he will feel lightheaded    BP Readings from Last 3 Encounters:  07/07/16 (!) 122/56  06/29/16 (!) 114/53  06/24/16 (!) 130/54     Most recent eye exam was 7/17, Dr Marina Gravel? Has had no retinopathy  Most recent foot exam: 12/17  History of depression on 2 drugs, recently controlled  LABS:  No visits with results within 1 Week(s) from this visit.  Latest known visit with results is:  Hospital Outpatient Visit on 06/19/2016  Component Date Value Ref Range Status  .  Sodium 06/19/2016 137  135 - 145 mmol/L Final  . Potassium 06/19/2016 4.3  3.5 - 5.1 mmol/L Final  . Chloride 06/19/2016 96* 101 - 111 mmol/L Final  . CO2 06/19/2016 30  22 - 32 mmol/L Final  . Glucose, Bld 06/19/2016 347* 65 - 99 mg/dL Final  . BUN 06/19/2016 33* 6 - 20 mg/dL Final  . Creatinine, Ser 06/19/2016 1.41* 0.61 - 1.24 mg/dL Final  . Calcium 06/19/2016 8.4* 8.9 - 10.3 mg/dL Final  . GFR calc non Af Amer 06/19/2016 47* >60 mL/min Final  . GFR calc Af Amer 06/19/2016 54* >60 mL/min Final   Comment: (NOTE) The eGFR has been calculated using the CKD EPI equation. This calculation has not been validated in all clinical situations. eGFR's persistently <60 mL/min signify possible Chronic Kidney Disease.   . Anion gap 06/19/2016 11  5 - 15 Final  . B Natriuretic Peptide 06/19/2016 454.6* 0.0 - 100.0 pg/mL Final    Physical Examination:  BP (!) 122/56   Pulse 61   Ht _0  (1.803 m)   Wt 227 lb 6.4 oz (103.1 kg)   SpO2 92%   BMI 31.72 kg/m       ASSESSMENT:  Diabetes type 2, uncontrolled    See history of present illness for detailed discussion of current diabetes management, blood sugar patterns and problems identified  His blood sugars have been very inconsistent based on his appetite, hospitalization for CHF and other issues and inconsistent diet as above He has lost weight and this may have affected his blood sugars also with requiring less mealtime insulin However more recently with eating more normally his blood sugars have been nearly 400 after eating at night Although he supposed to be switching to NovoLog he is still taking her regular insulin which seems to be not controlling postprandial peaks as well and occasionally causing low normal readings later at night  His A1c is 7.3 and appears consistently better the last year   HYPERTENSION: Blood pressure is low normal but he is not on any significant antihypertensives He will need to follow-up with his  cardiologist, reportedly has an appointment in about 2 weeks  Renal function: His last levels were somewhat worse, continues to be on Jardiance/metformin and needs to be rechecked and possibly adjust the dose if needed  PLAN:     He will change Regular Insulin to [place] which will provide better postprandial control and less late evening hypoglycemia  Discussed timing of this insulin and  adjustment of dose if postprandial readings are not controlled  Will increase his suppertime dose and possibly needs other mealtime doses increased  He needs to check his blood sugar more consistently at various times as directed because of his variable blood sugars lately  Fasting readings are still mildly increased and will only reduce his Toujeo if morning readings are consistently below 90, discussed needing to do this based on his fasting blood sugar patterns  He will also need to call us if he has any significant or consistent hypoglycemia with readings below 70  He needs to cut back on high glycemic index foods like ice cream  Will need short-term follow-up in 6 weeks to reassess his insulin requirement    Patient Instructions  Check blood sugars on waking up  3-4/7 days   Also check blood sugars about 2 hours after a meal and do this after different meals by rotation  Recommended blood sugar levels on waking up is 90-130 and about 2 hours after meal is 130-160  Please bring your blood sugar monitor to each visit, thank you  NOVOLOG OR NOVOLIN R AT LUNCH IS 20 AND 26 AT SUPPER  nOVOLOG IS 10-15 MIN BEFORE THE MEALS    Counseling time on subjects discussed above is over 50% of today's 25 minute visit   Joshua [name] 07/07/2016, 9:08 AM   Note: This office note was prepared with Estate agent. Any transcriptional errors that result from this process are unintentional.

## 2016-07-06 NOTE — Patient Outreach (Signed)
[facility]) Care Management  07/06/2016  Joshua [name] [birth year] [id number]   Subjective: wife reports client is still weak, but better than when he was hospitalized.  Objective: none-telephonic call.  Assessment: Client hospitalized 5/31-6/7 with respiratory failure, heart failure. Presented to the hospital 6/11 due to weakness, falls transferred to skilled facility [facility]) for rehabilitation. History of chronic diastolic CHF, COPD on home 02, COPD, history of asbestos exposure, HTN, HLD, and DM.  RNCM received notification today that client was discharged from rehabilitation on 06/26/16. RNCM called for transition of care. RNCM spoke with wife, caregiver who reports that client is better, but he is still not doing much activity. Client is getting up and getting around, but activities limited to activities of daily living. Mrs. Shellhammer acknowledges that home health is involved and nursing and physical therapy have been to see client.  Medications reviewed. No issues noted. Client has seen primary care since discharge and upcoming appointments discussed.  Plan: home visit next week.  Thea Silversmith, RN, MSN, [place] Coordinator Cell: [phone]

## 2016-07-07 ENCOUNTER — Ambulatory Visit (INDEPENDENT_AMBULATORY_CARE_PROVIDER_SITE_OTHER): Payer: Medicare Other | Admitting: Endocrinology

## 2016-07-07 ENCOUNTER — Encounter: Payer: Self-pay | Admitting: Endocrinology

## 2016-07-07 VITALS — BP 122/56 | HR 61 | Ht 71.0 in | Wt 227.4 lb

## 2016-07-07 DIAGNOSIS — E1122 Type 2 diabetes mellitus with diabetic chronic kidney disease: Secondary | ICD-10-CM | POA: Diagnosis not present

## 2016-07-07 DIAGNOSIS — E1165 Type 2 diabetes mellitus with hyperglycemia: Secondary | ICD-10-CM | POA: Diagnosis not present

## 2016-07-07 DIAGNOSIS — I10 Essential (primary) hypertension: Secondary | ICD-10-CM

## 2016-07-07 DIAGNOSIS — I5041 Acute combined systolic (congestive) and diastolic (congestive) heart failure: Secondary | ICD-10-CM | POA: Diagnosis not present

## 2016-07-07 DIAGNOSIS — N289 Disorder of kidney and ureter, unspecified: Secondary | ICD-10-CM

## 2016-07-07 DIAGNOSIS — Z7982 Long term (current) use of aspirin: Secondary | ICD-10-CM | POA: Diagnosis not present

## 2016-07-07 DIAGNOSIS — Z7901 Long term (current) use of anticoagulants: Secondary | ICD-10-CM | POA: Diagnosis not present

## 2016-07-07 DIAGNOSIS — J449 Chronic obstructive pulmonary disease, unspecified: Secondary | ICD-10-CM | POA: Diagnosis not present

## 2016-07-07 DIAGNOSIS — N181 Chronic kidney disease, stage 1: Secondary | ICD-10-CM | POA: Diagnosis not present

## 2016-07-07 DIAGNOSIS — Z794 Long term (current) use of insulin: Secondary | ICD-10-CM

## 2016-07-07 DIAGNOSIS — M1991 Primary osteoarthritis, unspecified site: Secondary | ICD-10-CM | POA: Diagnosis not present

## 2016-07-07 DIAGNOSIS — Z7952 Long term (current) use of systemic steroids: Secondary | ICD-10-CM | POA: Diagnosis not present

## 2016-07-07 DIAGNOSIS — I4891 Unspecified atrial fibrillation: Secondary | ICD-10-CM | POA: Diagnosis not present

## 2016-07-07 DIAGNOSIS — I130 Hypertensive heart and chronic kidney disease with heart failure and stage 1 through stage 4 chronic kidney disease, or unspecified chronic kidney disease: Secondary | ICD-10-CM | POA: Diagnosis not present

## 2016-07-07 DIAGNOSIS — Z951 Presence of aortocoronary bypass graft: Secondary | ICD-10-CM | POA: Diagnosis not present

## 2016-07-07 DIAGNOSIS — I2510 Atherosclerotic heart disease of native coronary artery without angina pectoris: Secondary | ICD-10-CM | POA: Diagnosis not present

## 2016-07-07 LAB — COMPREHENSIVE METABOLIC PANEL
ALK PHOS: 76 U/L (ref 39–117)
ALT: 21 U/L (ref 0–53)
AST: 17 U/L (ref 0–37)
Albumin: 3.7 g/dL (ref 3.5–5.2)
BILIRUBIN TOTAL: 0.5 mg/dL (ref 0.2–1.2)
BUN: 36 mg/dL — AB (ref 6–23)
CO2: 27 mEq/L (ref 19–32)
Calcium: 9.7 mg/dL (ref 8.4–10.5)
Chloride: 98 mEq/L (ref 96–112)
Creatinine, Ser: 1.41 mg/dL (ref 0.40–1.50)
GFR: 51.77 mL/min — ABNORMAL LOW (ref >60.00)
GLUCOSE: 155 mg/dL — AB (ref 70–99)
Potassium: 4.6 mEq/L (ref 3.5–5.1)
SODIUM: 136 meq/L (ref 135–145)
TOTAL PROTEIN: 6.4 g/dL (ref 6.0–8.3)

## 2016-07-07 LAB — MICROALBUMIN / CREATININE URINE RATIO
Creatinine,U: 115.7 mg/dL
Microalb Creat Ratio: 1.2 mg/g (ref 0.0–30.0)
Microalb, Ur: 1.4 mg/dL (ref 0.0–1.9)

## 2016-07-07 NOTE — Patient Instructions (Addendum)
Check blood sugars on waking up  3-4/7 days   Also check blood sugars about 2 hours after a meal and do this after different meals by rotation  Recommended blood sugar levels on waking up is 90-130 and about 2 hours after meal is 130-160  Please bring your blood sugar monitor to each visit, thank you  NOVOLOG OR NOVOLIN R AT LUNCH IS 20 AND 26 AT SUPPER  nOVOLOG IS 10-15 MIN BEFORE THE MEALS

## 2016-07-09 ENCOUNTER — Ambulatory Visit: Payer: Medicare Other | Admitting: Endocrinology

## 2016-07-09 DIAGNOSIS — I2510 Atherosclerotic heart disease of native coronary artery without angina pectoris: Secondary | ICD-10-CM | POA: Diagnosis not present

## 2016-07-09 DIAGNOSIS — I4891 Unspecified atrial fibrillation: Secondary | ICD-10-CM | POA: Diagnosis not present

## 2016-07-09 DIAGNOSIS — I5041 Acute combined systolic (congestive) and diastolic (congestive) heart failure: Secondary | ICD-10-CM | POA: Diagnosis not present

## 2016-07-09 DIAGNOSIS — Z7901 Long term (current) use of anticoagulants: Secondary | ICD-10-CM | POA: Diagnosis not present

## 2016-07-09 DIAGNOSIS — I130 Hypertensive heart and chronic kidney disease with heart failure and stage 1 through stage 4 chronic kidney disease, or unspecified chronic kidney disease: Secondary | ICD-10-CM | POA: Diagnosis not present

## 2016-07-09 DIAGNOSIS — M1991 Primary osteoarthritis, unspecified site: Secondary | ICD-10-CM | POA: Diagnosis not present

## 2016-07-09 DIAGNOSIS — N400 Enlarged prostate without lower urinary tract symptoms: Secondary | ICD-10-CM | POA: Diagnosis not present

## 2016-07-09 DIAGNOSIS — Z7982 Long term (current) use of aspirin: Secondary | ICD-10-CM | POA: Diagnosis not present

## 2016-07-09 DIAGNOSIS — Z794 Long term (current) use of insulin: Secondary | ICD-10-CM | POA: Diagnosis not present

## 2016-07-09 DIAGNOSIS — Z951 Presence of aortocoronary bypass graft: Secondary | ICD-10-CM | POA: Diagnosis not present

## 2016-07-09 DIAGNOSIS — I5042 Chronic combined systolic (congestive) and diastolic (congestive) heart failure: Secondary | ICD-10-CM | POA: Diagnosis not present

## 2016-07-09 DIAGNOSIS — E1122 Type 2 diabetes mellitus with diabetic chronic kidney disease: Secondary | ICD-10-CM | POA: Diagnosis not present

## 2016-07-09 DIAGNOSIS — N181 Chronic kidney disease, stage 1: Secondary | ICD-10-CM | POA: Diagnosis not present

## 2016-07-09 DIAGNOSIS — J449 Chronic obstructive pulmonary disease, unspecified: Secondary | ICD-10-CM | POA: Diagnosis not present

## 2016-07-09 DIAGNOSIS — I480 Paroxysmal atrial fibrillation: Secondary | ICD-10-CM | POA: Diagnosis not present

## 2016-07-09 DIAGNOSIS — Z7952 Long term (current) use of systemic steroids: Secondary | ICD-10-CM | POA: Diagnosis not present

## 2016-07-11 DIAGNOSIS — I4891 Unspecified atrial fibrillation: Secondary | ICD-10-CM | POA: Diagnosis not present

## 2016-07-11 DIAGNOSIS — Z7952 Long term (current) use of systemic steroids: Secondary | ICD-10-CM | POA: Diagnosis not present

## 2016-07-11 DIAGNOSIS — I2510 Atherosclerotic heart disease of native coronary artery without angina pectoris: Secondary | ICD-10-CM | POA: Diagnosis not present

## 2016-07-11 DIAGNOSIS — I5041 Acute combined systolic (congestive) and diastolic (congestive) heart failure: Secondary | ICD-10-CM | POA: Diagnosis not present

## 2016-07-11 DIAGNOSIS — Z794 Long term (current) use of insulin: Secondary | ICD-10-CM | POA: Diagnosis not present

## 2016-07-11 DIAGNOSIS — Z7901 Long term (current) use of anticoagulants: Secondary | ICD-10-CM | POA: Diagnosis not present

## 2016-07-11 DIAGNOSIS — N181 Chronic kidney disease, stage 1: Secondary | ICD-10-CM | POA: Diagnosis not present

## 2016-07-11 DIAGNOSIS — J449 Chronic obstructive pulmonary disease, unspecified: Secondary | ICD-10-CM | POA: Diagnosis not present

## 2016-07-11 DIAGNOSIS — Z951 Presence of aortocoronary bypass graft: Secondary | ICD-10-CM | POA: Diagnosis not present

## 2016-07-11 DIAGNOSIS — E1122 Type 2 diabetes mellitus with diabetic chronic kidney disease: Secondary | ICD-10-CM | POA: Diagnosis not present

## 2016-07-11 DIAGNOSIS — M1991 Primary osteoarthritis, unspecified site: Secondary | ICD-10-CM | POA: Diagnosis not present

## 2016-07-11 DIAGNOSIS — Z7982 Long term (current) use of aspirin: Secondary | ICD-10-CM | POA: Diagnosis not present

## 2016-07-11 DIAGNOSIS — I130 Hypertensive heart and chronic kidney disease with heart failure and stage 1 through stage 4 chronic kidney disease, or unspecified chronic kidney disease: Secondary | ICD-10-CM | POA: Diagnosis not present

## 2016-07-13 ENCOUNTER — Ambulatory Visit (HOSPITAL_COMMUNITY)
Admission: RE | Admit: 2016-07-13 | Discharge: 2016-07-13 | Disposition: A | Discharge status: Home / Self Care | Payer: Medicare Other | Source: Ambulatory Visit | Attending: Internal Medicine | Admitting: Internal Medicine

## 2016-07-13 DIAGNOSIS — I110 Hypertensive heart disease with heart failure: Secondary | ICD-10-CM | POA: Diagnosis not present

## 2016-07-13 DIAGNOSIS — I2510 Atherosclerotic heart disease of native coronary artery without angina pectoris: Secondary | ICD-10-CM | POA: Diagnosis not present

## 2016-07-13 DIAGNOSIS — E119 Type 2 diabetes mellitus without complications: Secondary | ICD-10-CM | POA: Insufficient documentation

## 2016-07-13 DIAGNOSIS — Z951 Presence of aortocoronary bypass graft: Secondary | ICD-10-CM | POA: Insufficient documentation

## 2016-07-13 DIAGNOSIS — I480 Paroxysmal atrial fibrillation: Secondary | ICD-10-CM | POA: Insufficient documentation

## 2016-07-13 DIAGNOSIS — R55 Syncope and collapse: Secondary | ICD-10-CM | POA: Diagnosis not present

## 2016-07-13 DIAGNOSIS — I5022 Chronic systolic (congestive) heart failure: Secondary | ICD-10-CM | POA: Diagnosis not present

## 2016-07-13 DIAGNOSIS — J449 Chronic obstructive pulmonary disease, unspecified: Secondary | ICD-10-CM | POA: Insufficient documentation

## 2016-07-13 MED ORDER — LOSARTAN POTASSIUM 25 MG PO TABS: 12.5000 mg | ORAL_TABLET | Freq: Every day | ORAL | 5 refills | Status: DC

## 2016-07-13 NOTE — Progress Notes (Signed)
HF MD: [facility]  HPI:  Joshua [name] is a 76 y.o. Caucasian male with PMH of chronic systolic CHF (EF 79-39% on 06/07/16), COPD on home 02, COPD, history of asbestos exposure, HTN, HLD, and DM.   Admitted 5/31 -> 06/11/16 with worsening SOB, volume overload, and increased 02 demand. Diuresed on IV lasix and given solumedrol. Thought to be mixed AECOPD and acute CHF. Moved to stepdown 06/05/16 with worsening resp status and BiPAP requirement. Improved with nebs, steroids, ABX and IV lasix + metolazone. Sent home on [place] and prednisone taper. Overall diuresed 22 lbs. Discharge weight 233 lbs.   Pt admitted from 6/11 -> 06/17/16 with syncope (unwitnessed) Noted to have fall x 3. EKG with [place] with chronic LBBB. Creatinine mildly elevated. CT head negative for acute process. R hip xray negative for fracture or dislocated hardware.  Felt to be vasovagal. Received NS and diuretics held up to discharge.   He presents today for pharmacist-led HF medication titration. At last HF clinic visit on 06/19/16, no medication changes were made 2/2 recent vasovagal syncope. Feeling good overall. In rehab at [facility]. No further lightheadedness or dizziness. No further falls.       . Shortness of breath/dyspnea on exertion? Yes - stable  . Orthopnea/PND? Yes (stable) - lifts head of bed ~20 degrees  . Edema? Yes - slight left ankle swelling (stable 2/2 knee surgery) . Lightheadedness/dizziness? Yes - only upon standing too quickly. . Daily weights at home? Yes - stable 226-227 lb . Blood pressure/heart rate monitoring at home? Yes - this am 116/58 mmHg  . Following low-sodium/fluid-restricted diet? Yes - uses "new salt" salt substitute, staying under 2L/day (4-5 bottles), diet coke    HF Medications: Bisoprolol 5 mg PO daily Digoxin 0.125 mg PO daily Isosorbide mononitrate 30 mg PO daily KCl 20 mEq PO daily  Spironolactone 25 mg PO daily Torsemide 40 mg PO daily   Has the patient been experiencing any  side effects to the medications prescribed?  no  Does the patient have any problems obtaining medications due to transportation or finances?   Yes - now in donut hole, already enrolled in [employer] foundation and aware that it can be used for any HF medications that are >$25/mo; brought pharmacy print out and 2017 tax forms for Xarelto patient assistance  Understanding of regimen: good Understanding of indications: good Potential of compliance: good Patient understands to avoid NSAIDs. Patient understands to avoid decongestants.    Pertinent Lab Values: . 07/07/16: Serum creatinine 1.41 (BL ~1.2-1.4), BUN 36, Potassium 4.6, Sodium 136 . 06/19/16: BNP 454.6 . 06/16/16: Magnesium 2.3, Digoxin 0.4   Vital Signs: . Weight: 229.4 lb (dry weight: 225-227 lb) . Blood pressure: 116/48 mmHg  . Heart rate: 66 bpm    Assessment: 1. Chronicsystolic CHF (EF 03-00% on 06/07/16), due to ICM. NYHA class IIIsymptoms.  - Volume status stable  - Start losartan 12.5 mg daily with close monitoring of BP and for worsening lightheadedness  - Continue bisoprolol 5 mg daily, digoxin 0.125 mg daily, KCl 20 meq daily, spironolactone 25 mg daily, torsemide 40 mg daily - Basic disease state pathophysiology, medication indication, mechanism and side effects reviewed at length with patient and he verbalized understanding 2. Chronic hypoxemic respiratory failure  - Stable on home 02 3. CAD s/p CABG (cath 06/10/16 with severe 3V native disease with patent grafts. No interventional target)  - Continue ASA, statin, BB, spironolactone and new losartan  4. Paroxysmal A fib  - Regular  by exam  - Continue Xarelto 20 mg daily. No bleeding.  5. Syncope (felt to be vasovagal. Given gentle IVF and improved with diuretics held) - No further episodes reported    - If recurs, would likely need to consider for ICD with low EF 6. Recent AKI  - SCr seems to be stable now ~1.2-1.4  Plan: 1) Medication changes: Based on clinical  presentation, vital signs and recent labs will start losartan 12.5 mg daily 2) Labs: BMET in 1 week 3) Follow-up: Dr. [name] on 08/24/16   Joshua [name], PharmD, BCPS, CPP Clinical Pharmacist Pager: [phone] Phone: [phone] 07/13/2016 2:02 PM

## 2016-07-13 NOTE — Patient Instructions (Addendum)
It was great to see you today!  Please START losartan 12.5 mg (1/2 tablet) EVERY EVENING BEFORE BEDTIME.  You are scheduled for blood work next Monday, 07/20/16.   Please keep your appointment with Dr. [name] on 08/24/16.

## 2016-07-14 ENCOUNTER — Other Ambulatory Visit (HOSPITAL_COMMUNITY): Payer: Self-pay | Admitting: Pharmacist

## 2016-07-14 ENCOUNTER — Other Ambulatory Visit: Payer: Self-pay | Admitting: *Deleted

## 2016-07-14 ENCOUNTER — Telehealth: Payer: Self-pay | Admitting: Endocrinology

## 2016-07-14 DIAGNOSIS — N181 Chronic kidney disease, stage 1: Secondary | ICD-10-CM | POA: Diagnosis not present

## 2016-07-14 DIAGNOSIS — I2510 Atherosclerotic heart disease of native coronary artery without angina pectoris: Secondary | ICD-10-CM | POA: Diagnosis not present

## 2016-07-14 DIAGNOSIS — Z7952 Long term (current) use of systemic steroids: Secondary | ICD-10-CM | POA: Diagnosis not present

## 2016-07-14 DIAGNOSIS — M1991 Primary osteoarthritis, unspecified site: Secondary | ICD-10-CM | POA: Diagnosis not present

## 2016-07-14 DIAGNOSIS — E1122 Type 2 diabetes mellitus with diabetic chronic kidney disease: Secondary | ICD-10-CM | POA: Diagnosis not present

## 2016-07-14 DIAGNOSIS — I5041 Acute combined systolic (congestive) and diastolic (congestive) heart failure: Secondary | ICD-10-CM | POA: Diagnosis not present

## 2016-07-14 DIAGNOSIS — Z7901 Long term (current) use of anticoagulants: Secondary | ICD-10-CM | POA: Diagnosis not present

## 2016-07-14 DIAGNOSIS — Z951 Presence of aortocoronary bypass graft: Secondary | ICD-10-CM | POA: Diagnosis not present

## 2016-07-14 DIAGNOSIS — Z7982 Long term (current) use of aspirin: Secondary | ICD-10-CM | POA: Diagnosis not present

## 2016-07-14 DIAGNOSIS — I130 Hypertensive heart and chronic kidney disease with heart failure and stage 1 through stage 4 chronic kidney disease, or unspecified chronic kidney disease: Secondary | ICD-10-CM | POA: Diagnosis not present

## 2016-07-14 DIAGNOSIS — J449 Chronic obstructive pulmonary disease, unspecified: Secondary | ICD-10-CM | POA: Diagnosis not present

## 2016-07-14 DIAGNOSIS — Z794 Long term (current) use of insulin: Secondary | ICD-10-CM | POA: Diagnosis not present

## 2016-07-14 DIAGNOSIS — I4891 Unspecified atrial fibrillation: Secondary | ICD-10-CM | POA: Diagnosis not present

## 2016-07-14 MED ORDER — RIVAROXABAN 20 MG PO TABS: 20.0000 mg | ORAL_TABLET | Freq: Every day | ORAL | 3 refills | Status: DC

## 2016-07-14 NOTE — Patient Outreach (Signed)
[facility]) Care Management  07/14/2016  ZAKARIAH URWIN 08/13/1940 [id number]   CSW will perform a case closure on patient, as all goals of treatment have been met from social work standpoint and no additional social work needs have been identified at this time. CSW spoke with patient's wife, Particia Nearing (ph#: [phone]) to confirm that there no further CSW needs identified. CSW will notify patient's RNCM, Denton Brick with [facility] of CSW's plans to close patient's case.    Raynaldo Opitz, LCSW [facility]  Clinical Social Worker cell #: [phone]

## 2016-07-14 NOTE — Telephone Encounter (Signed)
He was supposed to let us know when the Novolin R is finished and will switch to NovoLog Flex pen, taking 18 units at breakfast, 28 lunch and 26 at dinnertime.  Need to know exactly when his blood sugars are running over 200  and how much insulin doses he is taking

## 2016-07-14 NOTE — Telephone Encounter (Signed)
Patient was under the impression tat insulin was being changed at the last visit. No notes or rx refecting a change. Please advise.  Caller also states that sugar still persists well above 200.  Home # is best contact before 945. After 945 cell # is [phone]

## 2016-07-15 ENCOUNTER — Other Ambulatory Visit: Payer: Self-pay

## 2016-07-15 MED ORDER — INSULIN ASPART 100 UNIT/ML FLEXPEN: PEN_INJECTOR | SUBCUTANEOUS | 3 refills | Status: DC

## 2016-07-15 NOTE — Telephone Encounter (Signed)
Please confirm that blood sugars are high fasting before breakfast.  Is he taking the Novolin R at breakfast and suppertime?   Meanwhile increase his Toujeo by 5 units and start Novolog as indicated previously. He does not need to take Novolin R at bedtime

## 2016-07-15 NOTE — Telephone Encounter (Signed)
ok 

## 2016-07-15 NOTE — Telephone Encounter (Signed)
Patient's wife called to advise that insurance will only cover the Humolog not Novolog. Please correct rx to the following below:  **Remind patient they can make refill requests via MyChart**  Medication refill request (Name & Dosage):  HUMOLOG  Preferred pharmacy (Name & Address):  [facility], [place] - [address] [phone] (Phone) [phone] (Fax)    Other comments (if applicable):

## 2016-07-15 NOTE — Telephone Encounter (Signed)
Called patient and let him know to stop the Novolin R at bedtime and to increase his Toujeo by 5 units. Also sent in his prescription w/[facility] for Novolog Flexpen. He is taking his blood sugars fasting in the morning and they have been that high. He was currently taking Novolin R at only lunch and dinner.

## 2016-07-15 NOTE — Telephone Encounter (Signed)
Medication needs to be HUMOLOG per insurance.

## 2016-07-15 NOTE — Telephone Encounter (Signed)
Called patient and he stated that his high blood sugars are normally around the morning time. He is currently taking Toujeo in the morning and was taking 20 units of Novolin R at lunch time and 26 units of Novolin R at bedtime. Please let me know if you want me to send in a prescription for the Novolog Flex pen at the dosage you gave me or if you need to adjust the dosage.

## 2016-07-15 NOTE — Telephone Encounter (Signed)
Patient calling to check to status of the note below. Call patient with an update when possible.

## 2016-07-16 ENCOUNTER — Other Ambulatory Visit: Payer: Self-pay

## 2016-07-16 ENCOUNTER — Telehealth: Payer: Self-pay | Admitting: Family Medicine

## 2016-07-16 DIAGNOSIS — Z7982 Long term (current) use of aspirin: Secondary | ICD-10-CM | POA: Diagnosis not present

## 2016-07-16 DIAGNOSIS — I2510 Atherosclerotic heart disease of native coronary artery without angina pectoris: Secondary | ICD-10-CM | POA: Diagnosis not present

## 2016-07-16 DIAGNOSIS — I5041 Acute combined systolic (congestive) and diastolic (congestive) heart failure: Secondary | ICD-10-CM | POA: Diagnosis not present

## 2016-07-16 DIAGNOSIS — Z951 Presence of aortocoronary bypass graft: Secondary | ICD-10-CM | POA: Diagnosis not present

## 2016-07-16 DIAGNOSIS — N181 Chronic kidney disease, stage 1: Secondary | ICD-10-CM | POA: Diagnosis not present

## 2016-07-16 DIAGNOSIS — M1991 Primary osteoarthritis, unspecified site: Secondary | ICD-10-CM | POA: Diagnosis not present

## 2016-07-16 DIAGNOSIS — E1122 Type 2 diabetes mellitus with diabetic chronic kidney disease: Secondary | ICD-10-CM | POA: Diagnosis not present

## 2016-07-16 DIAGNOSIS — Z794 Long term (current) use of insulin: Secondary | ICD-10-CM | POA: Diagnosis not present

## 2016-07-16 DIAGNOSIS — I130 Hypertensive heart and chronic kidney disease with heart failure and stage 1 through stage 4 chronic kidney disease, or unspecified chronic kidney disease: Secondary | ICD-10-CM | POA: Diagnosis not present

## 2016-07-16 DIAGNOSIS — I4891 Unspecified atrial fibrillation: Secondary | ICD-10-CM | POA: Diagnosis not present

## 2016-07-16 DIAGNOSIS — Z7952 Long term (current) use of systemic steroids: Secondary | ICD-10-CM | POA: Diagnosis not present

## 2016-07-16 DIAGNOSIS — Z7901 Long term (current) use of anticoagulants: Secondary | ICD-10-CM | POA: Diagnosis not present

## 2016-07-16 DIAGNOSIS — J449 Chronic obstructive pulmonary disease, unspecified: Secondary | ICD-10-CM | POA: Diagnosis not present

## 2016-07-16 NOTE — Telephone Encounter (Signed)
How about a follow up to see how he's doing in about 2-3 weeks from now.-thx

## 2016-07-16 NOTE — Telephone Encounter (Signed)
RN, [name] called from [facility] and just wanted to give a heads up that patient was discharged from [place] on 06/26/16 if we need to schedule him for a follow up.   Please advise.

## 2016-07-16 NOTE — Patient Outreach (Signed)
[facility]) Care Management   07/16/2016  Joshua [name] 1940-07-02 [id number]  Joshua [name] is an 76 y.o. male  Subjective: Client reports feeling better.   Objective:  BP 130/60   Pulse (!) 59   Resp 16   Ht 1.816 m (5' 11.5")   Wt 228 lb 12.8 oz (103.8 kg) Comment: patient reported  SpO2 97%   BMI 31.47 kg/m  Review of Systems  Respiratory:       Decreased breath sounds-occasional inspiratory wheeze.  Cardiovascular:       Heart rate regular, trace edema to lower extremities. No pitting noted.    Physical Exam  Encounter Medications:   Outpatient Encounter Prescriptions as of 07/16/2016  Medication Sig  . acetaminophen (TYLENOL) 325 MG tablet Take 2 tablets (650 mg total) by mouth every 4 (four) hours as needed for headache or mild pain.  . Ascorbic Acid (VITAMIN C) 1000 MG tablet Take 1,000 mg by mouth daily.  [name] aspirin 81 MG tablet Take 81 mg by mouth at bedtime. Reported on 02/26/2015  . atorvastatin (LIPITOR) 40 MG tablet TAKE ONE TABLET BY MOUTH EVERY DAY  . bisoprolol (ZEBETA) 5 MG tablet Take 1 tablet (5 mg total) by mouth daily.  . budesonide-formoterol (SYMBICORT) 160-4.5 MCG/ACT inhaler Inhale 2 puffs into the lungs 2 (two) times daily.  [name] buPROPion (WELLBUTRIN SR) 150 MG 12 hr tablet TAKE ONE TABLET BY MOUTH TWICE DAILY  . Cholecalciferol (VITAMIN D-3) 1000 units CAPS Take 1,000 Units by mouth daily. Reported on 02/26/2015  . citalopram (CELEXA) 20 MG tablet Take 1 tablet (20 mg total) by mouth daily.  . digoxin (LANOXIN) 0.125 MG tablet Take 1 tablet (0.125 mg total) by mouth daily.  . fluticasone (FLONASE) 50 MCG/ACT nasal spray Place 2 sprays into both nostrils daily.  [name] gabapentin (NEURONTIN) 100 MG capsule Take two capsules three times daily  . guaiFENesin (MUCINEX) 600 MG 12 hr tablet Take 2 tablets (1,200 mg total) by mouth 2 (two) times daily.  . insulin aspart (NOVOLOG FLEXPEN) 100 UNIT/ML FlexPen Give 18 units at breakfast, 20 units at  lunch, and 26 units at dinner.  . Insulin Glargine (TOUJEO SOLOSTAR) 300 UNIT/ML SOPN Inject 70 Units into the skin daily.  . insulin regular (NOVOLIN R,HUMULIN R) 100 units/mL injection Inject 20-26 Units into the skin 2 (two) times daily before a meal. Inject 20 units before lunch and 26 units before supper  . isosorbide mononitrate (IMDUR) 30 MG 24 hr tablet TAKE ONE TABLET BY MOUTH DAILY  . losartan (COZAAR) 25 MG tablet Take 0.5 tablets (12.5 mg total) by mouth daily.  . Multiple Vitamin (MULTIVITAMIN WITH MINERALS) TABS Take 1 tablet by mouth daily.  . potassium chloride SA (K-DUR,KLOR-CON) 20 MEQ tablet Take 1 tablet (20 mEq total) by mouth daily.  [name] PROAIR HFA 108 (90 Base) MCG/ACT inhaler INHALE 2 PUFFS INTO THE LUNGS EVERY 4 HOURS AS NEEDED FOR WHEEZING ORSHORTNESS OF BREATH  . rivaroxaban (XARELTO) 20 MG TABS tablet Take 1 tablet (20 mg total) by mouth daily with supper.  [name] spironolactone (ALDACTONE) 25 MG tablet Take 1 tablet (25 mg total) by mouth daily.  [name] SYNJARDY 05-998 MG TABS TAKE ONE TABLET BY MOUTH TWICE DAILY AFTER A MEAL  . tamsulosin (FLOMAX) 0.4 MG CAPS capsule TAKE 1 CAPSULE BY MOUTH EVERY DAY AFTER SUPPER  . torsemide (DEMADEX) 20 MG tablet Take 2 tablets (40 mg total) by mouth daily.  . nitroGLYCERIN (NITROSTAT) 0.4 MG SL tablet  Place 1 tablet (0.4 mg total) under the tongue every 5 (five) minutes as needed for chest pain. (Patient not taking: Reported on 07/13/2016)   No facility-administered encounter medications on file as of 07/16/2016.     Functional Status:   In your present state of health, do you have any difficulty performing the following activities: 07/16/2016 06/22/2016  Hearing? Tempie Donning  Vision? N Y  Difficulty concentrating or making decisions? Y N  Walking or climbing stairs? Y N  Dressing or bathing? N N  Doing errands, shopping? Y N  Preparing Food and eating ? N -  Using the Toilet? N -  In the past six months, have you accidently leaked urine? N -  Do  you have problems with loss of bowel control? N -  Managing your Medications? N -  Managing your Finances? N -  Housekeeping or managing your Housekeeping? N -  Some recent data might be hidden    Fall/Depression Screening:    Fall Risk  06/22/2016 05/18/2016 01/09/2016  Falls in the past year? Yes Yes Yes  Number falls in past yr: 2 or more 2 or more -  Injury with Fall? Yes No Yes  Risk Factor Category  - - High Fall Risk  Risk for fall due to : History of fall(s);Impaired balance/gait - Impaired balance/gait  Risk for fall due to (comments): - - uses cane  Follow up Education provided;Falls prevention discussed - -   PHQ 2/9 Scores 07/01/2016 06/04/2016 05/18/2016 01/09/2016 10/29/2015 09/26/2015 08/01/2015  PHQ - 2 Score _0 0 2 3  PHQ- 9 Score - 24 10 - - 4 10  Exception Documentation - - - - - - -    Assessment: Client hospitalized 5/31-6/7 with respiratory failure, heart failure. Presented to the hospital 6/11 due to weakness, falls transferred to skilled facility [facility]) for rehabilitation. History of chronic diastolic CHF, COPD on home 02, COPD, history of asbestos exposure, HTN, HLD, and DM.  Home visit completed. Client met RNCM at the door. Gate-steady with walker. Also present at visit was wife, daughter in law. [place], Guadelupe Sabin present also.   Medications reviewed. Medications managed per Mrs. Rollene Rotunda. No questions or concerns noted.  Client acknowledges that he does not use his symbicort inhaler as scheduled, but states he will use his prn inhaler when needed. Client used incentive spirometer per home health nurse encouragement. RNCM reinforced the purpose of the symbicort inhaler and encouraged client to use daily as ordered. Client used Symbicort inhaler during home visit. Per Ms. Troxler, client's wheeze cleared following use of symbicort inhaler.    RNCM discussed heart failure and the zone tool.  Plan: telephonic transition of care next  week. [facility] CM Care Plan Problem One     Most Recent Value  Care Plan Problem One  recent admission-increased falls/weakness  Role Documenting the Problem One  Care Management [place] for Problem One  Active  [facility] Long Term Goal   client will not be readmitted within the next 31 days.  [facility] Long Term Goal Start Date  07/06/16  Interventions for Problem One Long Term Goal  discussed heart failure zone tool, RNCM reviewed fall prevention strategies, reviewed medications, discussed importance of using inhalers as scheduled, co-visit with home health nurse.  [facility] CM Short Term Goal #1   client will attend schedule appointment within the next 30 days.  [facility] CM Short Term Goal #1 Start Date  07/06/16  Interventions for Short Term Goal #1  encouraged to continue to follow up with provider visits as scheduled.  [facility] CM Short Term Goal #2   client will verbalize contact resource avialable within the next 30 dayes.  [facility] CM Short Term Goal #2 Start Date  07/06/16  [facility] CM Short Term Goal #2 Met Date  07/16/16     Thea Silversmith, RN, MSN, [place] Coordinator Cell: [phone]

## 2016-07-17 ENCOUNTER — Other Ambulatory Visit: Payer: Self-pay

## 2016-07-17 ENCOUNTER — Telehealth (HOSPITAL_COMMUNITY): Payer: Self-pay | Admitting: *Deleted

## 2016-07-17 MED ORDER — INSULIN LISPRO 100 UNIT/ML (KWIKPEN): PEN_INJECTOR | SUBCUTANEOUS | 4 refills | Status: DC

## 2016-07-17 NOTE — Telephone Encounter (Signed)
Humalog has been ordered

## 2016-07-17 NOTE — Telephone Encounter (Signed)
Joshua Moros, RN with [employer] called and left message on triage line asking for patient's recent Echo results as he is participating in their HF program.  Requested records faxed today to [phone].

## 2016-07-17 NOTE — Telephone Encounter (Signed)
Patient aware, appt scheduled 30 minute on 08/03/16 @ 2pm.

## 2016-07-20 ENCOUNTER — Other Ambulatory Visit (HOSPITAL_COMMUNITY): Payer: Self-pay | Admitting: *Deleted

## 2016-07-20 ENCOUNTER — Ambulatory Visit (HOSPITAL_COMMUNITY)
Admission: RE | Admit: 2016-07-20 | Discharge: 2016-07-20 | Disposition: A | Discharge status: Home / Self Care | Payer: Medicare Other | Source: Ambulatory Visit | Attending: Cardiology | Admitting: Cardiology

## 2016-07-20 DIAGNOSIS — I5022 Chronic systolic (congestive) heart failure: Secondary | ICD-10-CM

## 2016-07-20 LAB — BASIC METABOLIC PANEL
ANION GAP: 12 (ref 5–15)
BUN: 19 mg/dL (ref 6–20)
CO2: 22 mmol/L (ref 22–32)
Calcium: 9.1 mg/dL (ref 8.9–10.3)
Chloride: 102 mmol/L (ref 101–111)
Creatinine, Ser: 1.31 mg/dL — ABNORMAL HIGH (ref 0.61–1.24)
GFR calc Af Amer: 59 mL/min — ABNORMAL LOW (ref >60)
GFR, EST NON AFRICAN AMERICAN: 51 mL/min — AB (ref >60)
GLUCOSE: 235 mg/dL — AB (ref 65–99)
POTASSIUM: 4.5 mmol/L (ref 3.5–5.1)
Sodium: 136 mmol/L (ref 135–145)

## 2016-07-21 DIAGNOSIS — Z794 Long term (current) use of insulin: Secondary | ICD-10-CM | POA: Diagnosis not present

## 2016-07-21 DIAGNOSIS — I5041 Acute combined systolic (congestive) and diastolic (congestive) heart failure: Secondary | ICD-10-CM | POA: Diagnosis not present

## 2016-07-21 DIAGNOSIS — E1122 Type 2 diabetes mellitus with diabetic chronic kidney disease: Secondary | ICD-10-CM | POA: Diagnosis not present

## 2016-07-21 DIAGNOSIS — N181 Chronic kidney disease, stage 1: Secondary | ICD-10-CM | POA: Diagnosis not present

## 2016-07-21 DIAGNOSIS — I130 Hypertensive heart and chronic kidney disease with heart failure and stage 1 through stage 4 chronic kidney disease, or unspecified chronic kidney disease: Secondary | ICD-10-CM | POA: Diagnosis not present

## 2016-07-21 DIAGNOSIS — M1991 Primary osteoarthritis, unspecified site: Secondary | ICD-10-CM | POA: Diagnosis not present

## 2016-07-21 DIAGNOSIS — Z7952 Long term (current) use of systemic steroids: Secondary | ICD-10-CM | POA: Diagnosis not present

## 2016-07-21 DIAGNOSIS — I2510 Atherosclerotic heart disease of native coronary artery without angina pectoris: Secondary | ICD-10-CM | POA: Diagnosis not present

## 2016-07-21 DIAGNOSIS — Z7901 Long term (current) use of anticoagulants: Secondary | ICD-10-CM | POA: Diagnosis not present

## 2016-07-21 DIAGNOSIS — I4891 Unspecified atrial fibrillation: Secondary | ICD-10-CM | POA: Diagnosis not present

## 2016-07-21 DIAGNOSIS — J449 Chronic obstructive pulmonary disease, unspecified: Secondary | ICD-10-CM | POA: Diagnosis not present

## 2016-07-21 DIAGNOSIS — Z951 Presence of aortocoronary bypass graft: Secondary | ICD-10-CM | POA: Diagnosis not present

## 2016-07-21 DIAGNOSIS — Z7982 Long term (current) use of aspirin: Secondary | ICD-10-CM | POA: Diagnosis not present

## 2016-07-22 ENCOUNTER — Other Ambulatory Visit: Payer: Self-pay | Admitting: Family Medicine

## 2016-07-23 ENCOUNTER — Other Ambulatory Visit: Payer: Self-pay

## 2016-07-23 DIAGNOSIS — Z7982 Long term (current) use of aspirin: Secondary | ICD-10-CM | POA: Diagnosis not present

## 2016-07-23 DIAGNOSIS — I2510 Atherosclerotic heart disease of native coronary artery without angina pectoris: Secondary | ICD-10-CM | POA: Diagnosis not present

## 2016-07-23 DIAGNOSIS — J449 Chronic obstructive pulmonary disease, unspecified: Secondary | ICD-10-CM | POA: Diagnosis not present

## 2016-07-23 DIAGNOSIS — I5041 Acute combined systolic (congestive) and diastolic (congestive) heart failure: Secondary | ICD-10-CM | POA: Diagnosis not present

## 2016-07-23 DIAGNOSIS — Z794 Long term (current) use of insulin: Secondary | ICD-10-CM | POA: Diagnosis not present

## 2016-07-23 DIAGNOSIS — M1991 Primary osteoarthritis, unspecified site: Secondary | ICD-10-CM | POA: Diagnosis not present

## 2016-07-23 DIAGNOSIS — E1122 Type 2 diabetes mellitus with diabetic chronic kidney disease: Secondary | ICD-10-CM | POA: Diagnosis not present

## 2016-07-23 DIAGNOSIS — Z7901 Long term (current) use of anticoagulants: Secondary | ICD-10-CM | POA: Diagnosis not present

## 2016-07-23 DIAGNOSIS — Z7952 Long term (current) use of systemic steroids: Secondary | ICD-10-CM | POA: Diagnosis not present

## 2016-07-23 DIAGNOSIS — N181 Chronic kidney disease, stage 1: Secondary | ICD-10-CM | POA: Diagnosis not present

## 2016-07-23 DIAGNOSIS — Z951 Presence of aortocoronary bypass graft: Secondary | ICD-10-CM | POA: Diagnosis not present

## 2016-07-23 DIAGNOSIS — I130 Hypertensive heart and chronic kidney disease with heart failure and stage 1 through stage 4 chronic kidney disease, or unspecified chronic kidney disease: Secondary | ICD-10-CM | POA: Diagnosis not present

## 2016-07-23 DIAGNOSIS — I4891 Unspecified atrial fibrillation: Secondary | ICD-10-CM | POA: Diagnosis not present

## 2016-07-23 NOTE — Patient Outreach (Signed)
[place] [facility]) Care Management  07/23/2016  Joshua [name] Jan 11, 1940 [id number]  Subjective: "I feel a lot stronger".  Objective: none-telephonic  Assessment:  Client hospitalized 5/31-6/7 with respiratory failure, heart failure. Presented to the hospital 6/11 due to weakness, falls transferred to skilled facility [facility]) for rehabilitation. History of chronic diastolic CHF, COPD on home 02, COPD, history of asbestos exposure, HTN, HLD, and DM.  RNCM called for transition of care. Client denies any questions or concern. He reports he is feeling a lot stronger.  Client continues to weigh self daily, with no significant changes in weight.   Plan: telephonic transition of care call next week.  Thea Silversmith, RN, MSN, [place] Coordinator Cell: [phone]

## 2016-07-24 ENCOUNTER — Telehealth: Payer: Self-pay | Admitting: Endocrinology

## 2016-07-24 DIAGNOSIS — I130 Hypertensive heart and chronic kidney disease with heart failure and stage 1 through stage 4 chronic kidney disease, or unspecified chronic kidney disease: Secondary | ICD-10-CM | POA: Diagnosis not present

## 2016-07-24 DIAGNOSIS — Z7952 Long term (current) use of systemic steroids: Secondary | ICD-10-CM | POA: Diagnosis not present

## 2016-07-24 DIAGNOSIS — E1122 Type 2 diabetes mellitus with diabetic chronic kidney disease: Secondary | ICD-10-CM | POA: Diagnosis not present

## 2016-07-24 DIAGNOSIS — N181 Chronic kidney disease, stage 1: Secondary | ICD-10-CM | POA: Diagnosis not present

## 2016-07-24 DIAGNOSIS — M1991 Primary osteoarthritis, unspecified site: Secondary | ICD-10-CM | POA: Diagnosis not present

## 2016-07-24 DIAGNOSIS — Z7901 Long term (current) use of anticoagulants: Secondary | ICD-10-CM | POA: Diagnosis not present

## 2016-07-24 DIAGNOSIS — Z951 Presence of aortocoronary bypass graft: Secondary | ICD-10-CM | POA: Diagnosis not present

## 2016-07-24 DIAGNOSIS — I5041 Acute combined systolic (congestive) and diastolic (congestive) heart failure: Secondary | ICD-10-CM | POA: Diagnosis not present

## 2016-07-24 DIAGNOSIS — I2510 Atherosclerotic heart disease of native coronary artery without angina pectoris: Secondary | ICD-10-CM | POA: Diagnosis not present

## 2016-07-24 DIAGNOSIS — J449 Chronic obstructive pulmonary disease, unspecified: Secondary | ICD-10-CM | POA: Diagnosis not present

## 2016-07-24 DIAGNOSIS — I4891 Unspecified atrial fibrillation: Secondary | ICD-10-CM | POA: Diagnosis not present

## 2016-07-24 DIAGNOSIS — Z7982 Long term (current) use of aspirin: Secondary | ICD-10-CM | POA: Diagnosis not present

## 2016-07-24 DIAGNOSIS — Z794 Long term (current) use of insulin: Secondary | ICD-10-CM | POA: Diagnosis not present

## 2016-07-24 NOTE — Telephone Encounter (Signed)
[employer] calling to advise that the HUMOLOG quickpen would be accepted. [employer] is faxing over the request today.   Call both locations to update on medication.

## 2016-07-24 NOTE — Telephone Encounter (Signed)
Routing to you

## 2016-07-24 NOTE — Telephone Encounter (Signed)
[employer] calling to check on PA for insulin aspart (NOVOLOG FLEXPEN) 100 UNIT/ML FlexPen. Call to advise on status, reference #: [id number]

## 2016-07-25 DIAGNOSIS — J449 Chronic obstructive pulmonary disease, unspecified: Secondary | ICD-10-CM | POA: Diagnosis not present

## 2016-07-27 ENCOUNTER — Other Ambulatory Visit: Payer: Self-pay | Admitting: Family Medicine

## 2016-07-27 ENCOUNTER — Other Ambulatory Visit: Payer: Self-pay

## 2016-07-27 DIAGNOSIS — Z794 Long term (current) use of insulin: Secondary | ICD-10-CM | POA: Diagnosis not present

## 2016-07-27 DIAGNOSIS — I4891 Unspecified atrial fibrillation: Secondary | ICD-10-CM | POA: Diagnosis not present

## 2016-07-27 DIAGNOSIS — Z7901 Long term (current) use of anticoagulants: Secondary | ICD-10-CM | POA: Diagnosis not present

## 2016-07-27 DIAGNOSIS — I5041 Acute combined systolic (congestive) and diastolic (congestive) heart failure: Secondary | ICD-10-CM | POA: Diagnosis not present

## 2016-07-27 DIAGNOSIS — Z7982 Long term (current) use of aspirin: Secondary | ICD-10-CM | POA: Diagnosis not present

## 2016-07-27 DIAGNOSIS — Z7952 Long term (current) use of systemic steroids: Secondary | ICD-10-CM | POA: Diagnosis not present

## 2016-07-27 DIAGNOSIS — J449 Chronic obstructive pulmonary disease, unspecified: Secondary | ICD-10-CM | POA: Diagnosis not present

## 2016-07-27 DIAGNOSIS — M1991 Primary osteoarthritis, unspecified site: Secondary | ICD-10-CM | POA: Diagnosis not present

## 2016-07-27 DIAGNOSIS — I130 Hypertensive heart and chronic kidney disease with heart failure and stage 1 through stage 4 chronic kidney disease, or unspecified chronic kidney disease: Secondary | ICD-10-CM | POA: Diagnosis not present

## 2016-07-27 DIAGNOSIS — Z951 Presence of aortocoronary bypass graft: Secondary | ICD-10-CM | POA: Diagnosis not present

## 2016-07-27 DIAGNOSIS — N181 Chronic kidney disease, stage 1: Secondary | ICD-10-CM | POA: Diagnosis not present

## 2016-07-27 DIAGNOSIS — I2510 Atherosclerotic heart disease of native coronary artery without angina pectoris: Secondary | ICD-10-CM | POA: Diagnosis not present

## 2016-07-27 DIAGNOSIS — E1122 Type 2 diabetes mellitus with diabetic chronic kidney disease: Secondary | ICD-10-CM | POA: Diagnosis not present

## 2016-07-27 NOTE — Telephone Encounter (Signed)
Routing to you °

## 2016-07-27 NOTE — Telephone Encounter (Signed)
If he is taking 70 units of Toujeo every morning he will need to increase it to 80 units Also need to know if he is taking his mealtime insulin before eating and blood sugar readings at bedtime

## 2016-07-27 NOTE — Telephone Encounter (Signed)
Called patient and he stated that on 7/12 at 8:16am it was 176, today on 7/23 at 6:34am it was 278, on 7/21 at 4:14am it was 228, on 7/22 at 7:20am it was 245, on 7/20 at 4:10am it was 197, and on 7/19 at 6:28a, it was 252.  Please advise on how to adjust insulin. These are the only readings he could give me.

## 2016-07-27 NOTE — Telephone Encounter (Signed)
Patient is on Humolog and Tujeo and his blood sugars are still around 250. Patient's wife requesting a call as soon as possible to advise on how to get his blood sugars lower. Call to advise.

## 2016-07-27 NOTE — Telephone Encounter (Signed)
Called patient and spoke to his wife and she stated that he was taking 75 units of Toujeo and she will increase to 80 units per day. Also she stated that he was not currently checking his blood sugar at bedtime but he will start and he has been taking his mealtime insulin before eating his meals. I also instructed them to write down the times of his readings and his blood sugars and to call back in 3 days or sooner if they need to.

## 2016-07-27 NOTE — Telephone Encounter (Signed)
Routing to you

## 2016-07-28 DIAGNOSIS — H353132 Nonexudative age-related macular degeneration, bilateral, intermediate dry stage: Secondary | ICD-10-CM | POA: Diagnosis not present

## 2016-07-28 DIAGNOSIS — E119 Type 2 diabetes mellitus without complications: Secondary | ICD-10-CM | POA: Diagnosis not present

## 2016-07-28 LAB — HM DIABETES EYE EXAM

## 2016-07-28 NOTE — Telephone Encounter (Signed)
Lattie Haw, please call for this PA. Not sure if this has been done? Thanks!

## 2016-07-28 NOTE — Telephone Encounter (Signed)
[employer] has called multiple times to get clarification on the PA for the medication insulin aspart (NOVOLOG FLEXPEN) 100 UNIT/ML FlexPen. Call [employer] to advise today so they can close the case. [phone]

## 2016-07-29 ENCOUNTER — Other Ambulatory Visit: Payer: Self-pay | Admitting: Family Medicine

## 2016-07-29 DIAGNOSIS — E1122 Type 2 diabetes mellitus with diabetic chronic kidney disease: Secondary | ICD-10-CM | POA: Diagnosis not present

## 2016-07-29 DIAGNOSIS — J449 Chronic obstructive pulmonary disease, unspecified: Secondary | ICD-10-CM | POA: Diagnosis not present

## 2016-07-29 DIAGNOSIS — I4891 Unspecified atrial fibrillation: Secondary | ICD-10-CM | POA: Diagnosis not present

## 2016-07-29 DIAGNOSIS — I2510 Atherosclerotic heart disease of native coronary artery without angina pectoris: Secondary | ICD-10-CM | POA: Diagnosis not present

## 2016-07-29 DIAGNOSIS — N181 Chronic kidney disease, stage 1: Secondary | ICD-10-CM | POA: Diagnosis not present

## 2016-07-29 DIAGNOSIS — Z951 Presence of aortocoronary bypass graft: Secondary | ICD-10-CM | POA: Diagnosis not present

## 2016-07-29 DIAGNOSIS — Z794 Long term (current) use of insulin: Secondary | ICD-10-CM | POA: Diagnosis not present

## 2016-07-29 DIAGNOSIS — I5041 Acute combined systolic (congestive) and diastolic (congestive) heart failure: Secondary | ICD-10-CM | POA: Diagnosis not present

## 2016-07-29 DIAGNOSIS — M1991 Primary osteoarthritis, unspecified site: Secondary | ICD-10-CM | POA: Diagnosis not present

## 2016-07-29 DIAGNOSIS — Z7952 Long term (current) use of systemic steroids: Secondary | ICD-10-CM | POA: Diagnosis not present

## 2016-07-29 DIAGNOSIS — Z7901 Long term (current) use of anticoagulants: Secondary | ICD-10-CM | POA: Diagnosis not present

## 2016-07-29 DIAGNOSIS — I130 Hypertensive heart and chronic kidney disease with heart failure and stage 1 through stage 4 chronic kidney disease, or unspecified chronic kidney disease: Secondary | ICD-10-CM | POA: Diagnosis not present

## 2016-07-29 DIAGNOSIS — Z7982 Long term (current) use of aspirin: Secondary | ICD-10-CM | POA: Diagnosis not present

## 2016-07-30 ENCOUNTER — Other Ambulatory Visit: Payer: Self-pay | Admitting: Family Medicine

## 2016-07-30 ENCOUNTER — Other Ambulatory Visit: Payer: Self-pay

## 2016-07-30 NOTE — Patient Outreach (Signed)
[place] [facility]) Care Management  07/30/2016  CHUNG CHAGOYA December 08, 1940 [id number]  Subjective: "Everything is going good".  Objective: none-telephonic  Assessment: Client hospitalized 5/31-6/7 with respiratory failure, heart failure. Presented to the hospital 6/11 due to weakness, falls transferred to skilled facility [facility]) for rehabilitation. History of chronic diastolic CHF, COPD on home 02, COPD, history of asbestos exposure, HTN, HLD, and DM.  RNCM called for transition of care. Client continues to weigh self daily. Denies swelling or shortness of breath. RNCM discussed heart failure zone tool. Client with no questions or issues at this time.  Plan: Transition of care call next week.  Thea Silversmith, RN, MSN, [place] Coordinator Cell: [phone]

## 2016-08-03 ENCOUNTER — Encounter: Payer: Self-pay | Admitting: Family Medicine

## 2016-08-03 ENCOUNTER — Ambulatory Visit (INDEPENDENT_AMBULATORY_CARE_PROVIDER_SITE_OTHER): Payer: Medicare Other | Admitting: Family Medicine

## 2016-08-03 ENCOUNTER — Other Ambulatory Visit: Payer: Self-pay

## 2016-08-03 ENCOUNTER — Other Ambulatory Visit: Payer: Self-pay | Admitting: Family Medicine

## 2016-08-03 VITALS — BP 123/55 | HR 63 | Temp 98.0°F | Resp 16 | Ht 71.0 in | Wt 236.5 lb

## 2016-08-03 DIAGNOSIS — J438 Other emphysema: Secondary | ICD-10-CM | POA: Diagnosis not present

## 2016-08-03 DIAGNOSIS — I5032 Chronic diastolic (congestive) heart failure: Secondary | ICD-10-CM | POA: Diagnosis not present

## 2016-08-03 DIAGNOSIS — I10 Essential (primary) hypertension: Secondary | ICD-10-CM

## 2016-08-03 DIAGNOSIS — I480 Paroxysmal atrial fibrillation: Secondary | ICD-10-CM | POA: Diagnosis not present

## 2016-08-03 DIAGNOSIS — D0462 Carcinoma in situ of skin of left upper limb, including shoulder: Secondary | ICD-10-CM | POA: Diagnosis not present

## 2016-08-03 DIAGNOSIS — E7800 Pure hypercholesterolemia, unspecified: Secondary | ICD-10-CM

## 2016-08-03 DIAGNOSIS — L989 Disorder of the skin and subcutaneous tissue, unspecified: Secondary | ICD-10-CM

## 2016-08-03 DIAGNOSIS — D045 Carcinoma in situ of skin of trunk: Secondary | ICD-10-CM | POA: Diagnosis not present

## 2016-08-03 NOTE — Telephone Encounter (Signed)
This has been resolved novolog was changed to humalog on 07/17/16 and the patient as notified

## 2016-08-03 NOTE — Patient Instructions (Signed)
TAke TWO 20 mg torsemide tabs TWICE per day (once at BF and once at lunch).

## 2016-08-03 NOTE — Progress Notes (Addendum)
Office Note 08/03/2016  CC:  Chief Complaint  Patient presents with  . Follow-up    ? discharge from rehab?    HPI:  Joshua [name] is a 76 y.o. White male who is here for chronic illness f/u. He was in rehab for chronic weakness, repeated falls.  He was d/c'd home 06/26/16.  He was there for 9d. He now gets in home PT and in home nurse visit weekly.  COPD: he no longer smokes cigarettes.  Cough, only a little bit, only sometimes productive--his baseline.   Gets SOB with walking 100 ft on flat surface.    Chronic diastolic HF:  Says LE swelling stable.  No swelling in early AM.  Elevating legs help.   DOE stable as noted above.   Home scale wt's: 234 +/- 2 lbs.  He is limiting sodium and taking his diuretic.  Wife says he drinks >2L fluids per day. His wt has gradually risen over the last month by about 10 lbs.  Home pulse oximetry avg 95 %.  Doesn't use oxygen at this point.  HTN: bp check daily <120/80, HR 70s.  No hypotension.  No palpitations or heart racing. No orthopnea or PND.  HLD: lipid panel 04/2016 excellent.  Tolerating statin  PAF: on bisoprolol, dig, and xarelto.  DM 2--managed by Dr. Dwyane Dee in endocrinology.  Pt also notes left forearm flaky/crusty papule for the last year.  He picks at it and it will get smaller/bleed some, then come back.  No pain.    Past Medical History:  Diagnosis Date  . Acute respiratory distress 06/04/2016  . Adenomatous colon polyp 10/16/2011   Repeat 2018  . Arthritis     Hips, R>L & KNEES  . CAD, multiple vessel    3V CAD cath 08/08/13----CABG done shortly after.  . 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 pylori neg, no dysplasia or malignancy.  . Chronic diastolic heart failure ([place]) 2016  . Chronic renal insufficiency, stage 3 (moderate) 2018   GFR 50s  . COPD (chronic obstructive pulmonary disease) ([place])    GOLD II.  Spirometry  2004 borderline obstruction; 2015 mod obst: noncompliant  with bid symbicort so pulmonologist switched him to a once daily inhaler: Trelegy ellipta 12/2015.  . Diabetic nephropathy ([place])    Elevated urine microalb/cr 03/2011  . DM type 2 (diabetes mellitus, type 2) ([place])    Poor control on max oral meds--pt eventually agreed to insulin therapy.  As of 2017 his DM is being managed by Dr. Dwyane Dee in endo.  . DOE (dyspnea on exertion)    COPD + chronic diastolic HF  . Erectile dysfunction    Normal testosterone  . Hearing loss of both ears 2016   Hearing aids  . Hyperlipidemia   . Hyperplastic colon polyp 2001  . Hypertension   . Iron deficiency anemia 2014   03/2012 capsule endoscopy showed 2 AVMs--likely responsible for his IDA--lifetime iron supp recommended + q65moCBCs.  . Macular degeneration, dry    Mild, bilat (Optometrist, D[place]ford Knifeat [employer] [place]  . Obesity   . Open toe wound 11/2015   Wound care clinic appt made and then canceled when toe improved.  . Other and unspecified angina pectoris   . PAD (peripheral artery disease) ([place] 02/2016   Abnormal ABI's and waveforms: LE arterial duplex ordered for f/u as per cardiologist's recommendation.  Vasc eval by Dr. CBridgett Larsson impression was minimal PAD, recommended  maximize med mgmt.  [name] PAF (paroxysmal atrial fibrillation) ([place]) 10/2014   xarelto  . Pericardial effusion with cardiac tamponade 6//29/15   pericardiocentesis was done,  Infectious/inflamm (cytology showed NO MALIGNANT CELLS)  . Pleural plaque    Pleural plaques/asbestosis changes on CT chest done by pulm 05/2016.  [name] Pneumonia   . Tobacco dependence in remission    100+ pack-yr hx: quit after CABG    Past Surgical History:  Procedure Laterality Date  . APPENDECTOMY  1957  . CARDIAC CATHETERIZATION  08/08/2013  . CATARACT EXTRACTION W/ INTRAOCULAR LENS  IMPLANT, BILATERAL  04/08/2006 & 04/22/2006  . CATARACT EXTRACTION W/ INTRAOCULAR LENS  IMPLANT, BILATERAL Bilateral   . CHOLECYSTECTOMY OPEN  1999  .  COLONOSCOPY  10/16/2011   Procedure: COLONOSCOPY;  Surgeon: Inda Castle, MD;  Location: [facility];  Service: Endoscopy;  Laterality: N/A;  . CORONARY ARTERY BYPASS GRAFT N/A 08/14/2013   Procedure: CORONARY ARTERY BYPASS GRAFTING (CABG);  Surgeon: Melrose Nakayama, MD;  Location: [facility];  Service: Open Heart Surgery;  Laterality: N/A;  Times 4   using left internal mammary artery and endoscopically harvested bilateral saphenous vein  . ESOPHAGOGASTRODUODENOSCOPY  02/25/12   Atrophic gastritis with a few erosions--capsule endoscopy planned as of 02/25/12 (Dr. Deatra Ina).  [name] HARDWARE REMOVAL Right 08/12/2012   Procedure: HARDWARE REMOVAL;  Surgeon: Mcarthur Rossetti, MD;  Location: [facility];  Service: Orthopedics;  Laterality: Right;  . HIP SURGERY Right 1954   Repair of slipped capital femoral epiphysis.  . INTRAOPERATIVE TRANSESOPHAGEAL ECHOCARDIOGRAM N/A 08/14/2013   Normal LV function. Procedure: INTRAOPERATIVE TRANSESOPHAGEAL ECHOCARDIOGRAM;  Surgeon: Melrose Nakayama, MD;  Location: [facility];  Service: Open Heart Surgery;  Laterality: N/A;  . LEFT HEART CATHETERIZATION WITH CORONARY ANGIOGRAM N/A 08/08/2013   Procedure: LEFT HEART CATHETERIZATION WITH CORONARY ANGIOGRAM;  Surgeon: Jettie Booze, MD;  [facility];  Service: Cardiovascular;  Laterality: N/A;  . PATELLA FRACTURE SURGERY Left ~ 1979   bolt + 3 screws to repair tib plateau fx  . PERICARDIAL TAP N/A 07/01/2013   Procedure: PERICARDIAL TAP;  Surgeon: Jettie Booze, MD;  Location: [facility] CATH LAB;  Service: Cardiovascular;  Laterality: N/A;  . RIGHT/LEFT HEART CATH AND CORONARY ANGIOGRAPHY N/A 06/10/2016   Procedure: Right/Left Heart Cath and Coronary Angiography;  Surgeon: Larey Dresser, MD;  Location: [place] CV LAB;  Service: Cardiovascular;  Laterality: N/A;  . TESTICLE SURGERY  as a child   Undescended testicle brought down into scrotum  . TONSILLECTOMY  1947  . TOTAL HIP ARTHROPLASTY Right 08/12/2012    Procedure: REMOVAL OF OLD PINS RIGHT HIP AND RIGHT TOTAL HIP ARTHROPLASTY ANTERIOR APPROACH;  Surgeon: Mcarthur Rossetti, MD;  Location: [facility];  Service: Orthopedics;  Laterality: Right;  . TRANSTHORACIC ECHOCARDIOGRAM  10/30/14   Mod LVH, EF 60-65%, normal wall motion, mod mitral regurg, mild PAH    Family History  Problem Relation Age of Onset  . Heart disease Father   . Heart attack Father   . CVA Mother   . Hypertension Mother   . Diabetes Paternal Grandmother   . Breast cancer Sister   . Diabetes Maternal Uncle   . Heart disease Maternal Uncle   . Stroke Neg Hx     Social History   Social History  . Marital status: Married    Spouse name: [name]  . Number of children: 4  . Years of education: N/A   Occupational History  . bus Mining engineer  Social History Main Topics  . Smoking status: Former Smoker    Packs/day: 0.25    Years: 62.00    Types: Cigarettes    Quit date: 10/23/2015  . Smokeless tobacco: Former Systems developer    Quit date: 06/30/2013  . Alcohol use No  . Drug use: No  . Sexual activity: No   Other Topics Concern  . Not on file   Social History Narrative   Married, 4 children.   Formerly a Medical illustrator.   Level of education: HS.     +tobacco--lifelong/ quit 2015).  No alcohol or drugs.   No exercise.  +excessive caffeine.    Outpatient Medications Prior to Visit  Medication Sig Dispense Refill  . ACCU-CHEK GUIDE test strip USE TO CHECK BLOOD SUGAR 3 TIMES DAILY 100 each 11  . ACCU-CHEK GUIDE test strip USE TO CHECK BLOOD SUGAR 3 TIMES DAILY 100 each 11  . acetaminophen (TYLENOL) 325 MG tablet Take 2 tablets (650 mg total) by mouth every 4 (four) hours as needed for headache or mild pain.    . Ascorbic Acid (VITAMIN C) 1000 MG tablet Take 1,000 mg by mouth daily.    [name] aspirin 81 MG tablet Take 81 mg by mouth at bedtime. Reported on 02/26/2015    . atorvastatin (LIPITOR) 40 MG tablet TAKE ONE TABLET BY MOUTH EVERY DAY 30 tablet 6   . bisoprolol (ZEBETA) 5 MG tablet Take 1 tablet (5 mg total) by mouth daily. 30 tablet 6  . budesonide-formoterol (SYMBICORT) 160-4.5 MCG/ACT inhaler Inhale 2 puffs into the lungs 2 (two) times daily. 1 Inhaler 12  . buPROPion (WELLBUTRIN SR) 150 MG 12 hr tablet TAKE ONE TABLET BY MOUTH TWICE DAILY 60 tablet 6  . Cholecalciferol (VITAMIN D-3) 1000 units CAPS Take 1,000 Units by mouth daily. Reported on 02/26/2015    . citalopram (CELEXA) 20 MG tablet Take 1 tablet (20 mg total) by mouth daily. 30 tablet 2  . digoxin (LANOXIN) 0.125 MG tablet Take 1 tablet (0.125 mg total) by mouth daily. 30 tablet 6  . fluticasone (FLONASE) 50 MCG/ACT nasal spray Place 2 sprays into both nostrils daily. 16 g 1  . gabapentin (NEURONTIN) 100 MG capsule Take two capsules three times daily 180 capsule 6  . guaiFENesin (MUCINEX) 600 MG 12 hr tablet Take 2 tablets (1,200 mg total) by mouth 2 (two) times daily. 30 tablet 0  . Insulin Glargine (TOUJEO SOLOSTAR) 300 UNIT/ML SOPN Inject 70 Units into the skin daily. (Patient taking differently: Inject 80 Units into the skin daily. ) 6 pen 1  . insulin lispro (HUMALOG KWIKPEN) 100 UNIT/ML KiwkPen Inject 18 units at breakfast 20 units at lunch and 26 units at dinner (Patient taking differently: Inject 18 units at breakfast 20 units at lunch and 28 units at dinner) 5 mL 4  . isosorbide mononitrate (IMDUR) 30 MG 24 hr tablet TAKE ONE TABLET BY MOUTH DAILY 90 tablet 1  . losartan (COZAAR) 25 MG tablet Take 0.5 tablets (12.5 mg total) by mouth daily. 15 tablet 5  . Multiple Vitamin (MULTIVITAMIN WITH MINERALS) TABS Take 1 tablet by mouth daily.    . nitroGLYCERIN (NITROSTAT) 0.4 MG SL tablet Place 1 tablet (0.4 mg total) under the tongue every 5 (five) minutes as needed for chest pain. 25 tablet 3  . potassium chloride SA (K-DUR,KLOR-CON) 20 MEQ tablet Take 1 tablet (20 mEq total) by mouth daily. 30 tablet 6  . PROAIR HFA 108 (90 Base) MCG/ACT inhaler INHALE 2  PUFFS INTO THE LUNGS  EVERY 4 HOURS AS NEEDED FOR WHEEZING ORSHORTNESS OF BREATH 8.5 g 1  . rivaroxaban (XARELTO) 20 MG TABS tablet Take 1 tablet (20 mg total) by mouth daily with supper. 90 tablet 3  . spironolactone (ALDACTONE) 25 MG tablet Take 1 tablet (25 mg total) by mouth daily. 30 tablet 6  . SYNJARDY 05-998 MG TABS TAKE ONE TABLET BY MOUTH TWICE DAILY AFTER A MEAL 60 tablet 2  . tamsulosin (FLOMAX) 0.4 MG CAPS capsule TAKE 1 CAPSULE BY MOUTH EVERY DAY AFTER SUPPER 90 capsule 1  . torsemide (DEMADEX) 20 MG tablet Take 2 tablets (40 mg total) by mouth daily. 60 tablet 6  . insulin aspart (NOVOLOG FLEXPEN) 100 UNIT/ML FlexPen Give 18 units at breakfast, 20 units at lunch, and 26 units at dinner. (Patient not taking: Reported on 08/03/2016) 15 mL 3  . insulin regular (NOVOLIN R,HUMULIN R) 100 units/mL injection Inject 20-26 Units into the skin 2 (two) times daily before a meal. Inject 20 units before lunch and 26 units before supper     No facility-administered medications prior to visit.     Allergies  Allergen Reactions  . Morphine And Related Other (See Comments)    Drenched with perspiration  . Demerol [Meperidine] Nausea Only  . Starlix [Nateglinide] Other (See Comments)    gassy    ROS Review of Systems  Constitutional: Negative for fatigue and fever.  HENT: Negative for congestion and sore throat.   Eyes: Negative for visual disturbance.  Respiratory: Negative for cough.   Cardiovascular: Negative for chest pain.  Gastrointestinal: Negative for abdominal pain, blood in stool and nausea.  Genitourinary: Negative for dysuria.  Musculoskeletal: Negative for back pain and joint swelling.  Skin: Negative for rash.  Neurological: Negative for weakness and headaches.  Hematological: Negative for adenopathy.    PE; Blood pressure (!) 123/55, pulse 63, temperature 98 F (36.7 C), temperature source Oral, resp. rate 16, height 5' 11" (1.803 m), weight 236 lb 8 oz (107.3 kg), SpO2 94 %. Body mass  index is 32.99 kg/m.  Gen: Alert, well appearing.  Patient is oriented to person, place, time, and situation. AFFECT: pleasant, lucid thought and speech. CV: RRR, no m/r/g.   LUNGS: CTA bilat, nonlabored resps, good aeration in all lung fields. EXT: no clubbing or cyanosis.  He has trace bilat LE edema. SKIN: left forearm with 1 cm oval papule with mild raised borders, pinkish discoloration to center of lesion.  Crusting of superficial layer noted.  Pertinent labs:  Lab Results  Component Value Date   TSH 1.307 06/04/2016   Lab Results  Component Value Date   WBC 11.5 (H) 06/16/2016   HGB 13.7 06/16/2016   HCT 45.6 06/16/2016   MCV 85.9 06/16/2016   PLT 183 06/16/2016   Lab Results  Component Value Date   IRON 71 05/18/2016   IRON 76 05/18/2016   TIBC 406 05/18/2016   FERRITIN 18.1 (L) 05/18/2016    Lab Results  Component Value Date   CREATININE 1.31 (H) 07/20/2016   BUN 19 07/20/2016   NA 136 07/20/2016   K 4.5 07/20/2016   CL 102 07/20/2016   CO2 22 07/20/2016   Lab Results  Component Value Date   ALT 21 07/07/2016   AST 17 07/07/2016   ALKPHOS 76 07/07/2016   BILITOT 0.5 07/07/2016   Lab Results  Component Value Date   CHOL 119 02/19/2016   Lab Results  Component Value Date  HDL 43.30 02/19/2016   Lab Results  Component Value Date   LDLCALC 44 02/19/2016   Lab Results  Component Value Date   TRIG 159.0 (H) 02/19/2016   Lab Results  Component Value Date   CHOLHDL 3 02/19/2016   Lab Results  Component Value Date   PSA 1.33 09/01/2011   Lab Results  Component Value Date   HGBA1C 7.3 (H) 06/04/2016    ASSESSMENT AND PLAN:   1) Chronic diastolic CHF: gaining wt gradually over the last month or so, which could be partially fluid wt and partially wt gain due to caloric input.  Will increase torsemide briefly: take 40 mg bid x 2d, then resume 42m once daily. Recheck in office 1 wk, check BMET at that time. Continue losartan, dig,  bisoprolol, imdur, and K+.  2) COPD: The current medical regimen is effective;  continue present plan and medications.  3) HLD: tolerating statin, lipids excellent on recent check.  Transaminases normal.  4) HTN: good control.  The current medical regimen is effective;  continue present plan and medications.  5) PAF: stable, sounds like he is in sinus rhythm today.  Continue xarelto, dig, and bisoprolol.  6) DM: managed as per endocrinologist--Dr. KDwyane Dee  7) Left arm skin lesion: suspicious for AK vs SCC vs BCC. Shave excision of the entire lesion today was done, specimen sent to path.  PROCEDURE: Used 1 cc of 1% lidocaine with epi to infiltrate lesion on L forearm for local anesthesia.  Used dermablade to excise entire 1cm lesion in a shave fashion.  No signif bleeding.  No immediate complications.  Pt tolerated procedure well. Wound wrapped with compression dressing.  Specimen sent to pathology.  An After Visit Summary was printed and given to the patient.  FOLLOW UP:  Return in about 1 week (around 08/10/2016) for f/u chf/get BMET.  Signed:  PCrissie Sickles MD           08/03/2016

## 2016-08-04 ENCOUNTER — Encounter: Payer: Self-pay | Admitting: Family Medicine

## 2016-08-04 DIAGNOSIS — Z7952 Long term (current) use of systemic steroids: Secondary | ICD-10-CM | POA: Diagnosis not present

## 2016-08-04 DIAGNOSIS — N181 Chronic kidney disease, stage 1: Secondary | ICD-10-CM | POA: Diagnosis not present

## 2016-08-04 DIAGNOSIS — I130 Hypertensive heart and chronic kidney disease with heart failure and stage 1 through stage 4 chronic kidney disease, or unspecified chronic kidney disease: Secondary | ICD-10-CM | POA: Diagnosis not present

## 2016-08-04 DIAGNOSIS — I2510 Atherosclerotic heart disease of native coronary artery without angina pectoris: Secondary | ICD-10-CM | POA: Diagnosis not present

## 2016-08-04 DIAGNOSIS — J449 Chronic obstructive pulmonary disease, unspecified: Secondary | ICD-10-CM | POA: Diagnosis not present

## 2016-08-04 DIAGNOSIS — I5041 Acute combined systolic (congestive) and diastolic (congestive) heart failure: Secondary | ICD-10-CM | POA: Diagnosis not present

## 2016-08-04 DIAGNOSIS — Z7901 Long term (current) use of anticoagulants: Secondary | ICD-10-CM | POA: Diagnosis not present

## 2016-08-04 DIAGNOSIS — Z7982 Long term (current) use of aspirin: Secondary | ICD-10-CM | POA: Diagnosis not present

## 2016-08-04 DIAGNOSIS — Z951 Presence of aortocoronary bypass graft: Secondary | ICD-10-CM | POA: Diagnosis not present

## 2016-08-04 DIAGNOSIS — I4891 Unspecified atrial fibrillation: Secondary | ICD-10-CM | POA: Diagnosis not present

## 2016-08-04 DIAGNOSIS — E1122 Type 2 diabetes mellitus with diabetic chronic kidney disease: Secondary | ICD-10-CM | POA: Diagnosis not present

## 2016-08-04 DIAGNOSIS — M1991 Primary osteoarthritis, unspecified site: Secondary | ICD-10-CM | POA: Diagnosis not present

## 2016-08-04 DIAGNOSIS — Z794 Long term (current) use of insulin: Secondary | ICD-10-CM | POA: Diagnosis not present

## 2016-08-05 ENCOUNTER — Encounter: Payer: Self-pay | Admitting: Family Medicine

## 2016-08-05 ENCOUNTER — Telehealth (HOSPITAL_COMMUNITY): Payer: Self-pay | Admitting: Pharmacist

## 2016-08-05 NOTE — Telephone Encounter (Signed)
[employer] patient assistance approved for Xarelto 20 mg daily through 07/16/17.   Ruta Hinds. Velva Harman, PharmD, BCPS, CPP Clinical Pharmacist Pager: [phone] Phone: [phone] 08/05/2016 11:25 AM

## 2016-08-06 ENCOUNTER — Other Ambulatory Visit: Payer: Self-pay

## 2016-08-06 DIAGNOSIS — Z7982 Long term (current) use of aspirin: Secondary | ICD-10-CM | POA: Diagnosis not present

## 2016-08-06 DIAGNOSIS — I4891 Unspecified atrial fibrillation: Secondary | ICD-10-CM | POA: Diagnosis not present

## 2016-08-06 DIAGNOSIS — I2510 Atherosclerotic heart disease of native coronary artery without angina pectoris: Secondary | ICD-10-CM | POA: Diagnosis not present

## 2016-08-06 DIAGNOSIS — J449 Chronic obstructive pulmonary disease, unspecified: Secondary | ICD-10-CM | POA: Diagnosis not present

## 2016-08-06 DIAGNOSIS — I5041 Acute combined systolic (congestive) and diastolic (congestive) heart failure: Secondary | ICD-10-CM | POA: Diagnosis not present

## 2016-08-06 DIAGNOSIS — Z7952 Long term (current) use of systemic steroids: Secondary | ICD-10-CM | POA: Diagnosis not present

## 2016-08-06 DIAGNOSIS — M1991 Primary osteoarthritis, unspecified site: Secondary | ICD-10-CM | POA: Diagnosis not present

## 2016-08-06 DIAGNOSIS — Z794 Long term (current) use of insulin: Secondary | ICD-10-CM | POA: Diagnosis not present

## 2016-08-06 DIAGNOSIS — Z951 Presence of aortocoronary bypass graft: Secondary | ICD-10-CM | POA: Diagnosis not present

## 2016-08-06 DIAGNOSIS — N181 Chronic kidney disease, stage 1: Secondary | ICD-10-CM | POA: Diagnosis not present

## 2016-08-06 DIAGNOSIS — E1122 Type 2 diabetes mellitus with diabetic chronic kidney disease: Secondary | ICD-10-CM | POA: Diagnosis not present

## 2016-08-06 DIAGNOSIS — Z7901 Long term (current) use of anticoagulants: Secondary | ICD-10-CM | POA: Diagnosis not present

## 2016-08-06 DIAGNOSIS — I130 Hypertensive heart and chronic kidney disease with heart failure and stage 1 through stage 4 chronic kidney disease, or unspecified chronic kidney disease: Secondary | ICD-10-CM | POA: Diagnosis not present

## 2016-08-06 NOTE — Patient Outreach (Signed)
[facility]) Care Management  08/06/2016  Joshua [name] 1940/07/18 [id number]  Subjective: client without any questions or concerns, denies shortness of breath, denies edema.  Objective: none  Assessment: Client hospitalized 5/31-6/7 with respiratory failure, heart failure. Presented to the hospital 6/11 due to weakness, falls transferred to skilled facility [facility]) for rehabilitation. History of chronic diastolic CHF, COPD on home 02, COPD, history of asbestos exposure, HTN, HLD, and DM.  Call for transtion of care. Reviewed upcoming appointments in August with primary care; endocrinologist and cardiologist. No questions/concerns. Continues to weigh and record weights daily. Weight today 233 pounds per patient.  Home health still involved and nurse to see client today.  RNCM discussed follow up. Client states prefers telephonic follow up next month.  Plan: follow up telephonically next month.  Thea Silversmith, RN, MSN, [place] Coordinator Cell: [phone]

## 2016-08-10 ENCOUNTER — Other Ambulatory Visit: Payer: Medicare Other

## 2016-08-10 ENCOUNTER — Ambulatory Visit (INDEPENDENT_AMBULATORY_CARE_PROVIDER_SITE_OTHER): Payer: Medicare Other | Admitting: Family Medicine

## 2016-08-10 ENCOUNTER — Encounter: Payer: Self-pay | Admitting: Family Medicine

## 2016-08-10 ENCOUNTER — Other Ambulatory Visit: Payer: Self-pay | Admitting: *Deleted

## 2016-08-10 VITALS — BP 96/57 | HR 60 | Temp 98.4°F | Resp 16 | Wt 233.0 lb

## 2016-08-10 DIAGNOSIS — I5032 Chronic diastolic (congestive) heart failure: Secondary | ICD-10-CM | POA: Diagnosis not present

## 2016-08-10 DIAGNOSIS — K529 Noninfective gastroenteritis and colitis, unspecified: Secondary | ICD-10-CM

## 2016-08-10 DIAGNOSIS — C44629 Squamous cell carcinoma of skin of left upper limb, including shoulder: Secondary | ICD-10-CM

## 2016-08-10 DIAGNOSIS — Z1283 Encounter for screening for malignant neoplasm of skin: Secondary | ICD-10-CM

## 2016-08-10 MED ORDER — HYOSCYAMINE SULFATE 0.125 MG SL SUBL: SUBLINGUAL_TABLET | SUBLINGUAL | 1 refills | Status: DC

## 2016-08-10 NOTE — Progress Notes (Addendum)
OFFICE VISIT  08/10/2016   CC:  Chief Complaint  Patient presents with  . Follow-up    CHF   HPI:    Patient is a 76 y.o. Caucasian male who presents for 1 week f/u chf (diastolic dysfunction). Last visit we did 2 d of increased torsemide to 77m bid x 2d, then resumed 455mqd. Diuresed more, denies any worsening of chronic DOE, no cough or fever.  NO LE edema.  New c/o:  Has been having loose BMs about 1/2 of the days each week, says urgent loose BMS several times a day occur. Has not been able to hold his stool on one occasion.  Pt and wife seem to report this is chronic but worse lately but they are unable to be more specific.  No abd pain or fevers.  No blood or mucous in stool.  No meds tried except pepto in past, this helped.    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 We discussed this result today.  Decided on derm referral for general skin review/monitoring and f/u of left arm lesion.  Past Medical History:  Diagnosis Date  . Acute respiratory distress 06/04/2016  . Adenomatous colon polyp 10/16/2011   Repeat 2018  . Arthritis     Hips, R>L & KNEES  . CAD, multiple vessel    3V CAD cath 08/08/13----CABG done shortly after.  . 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 pylori neg, no dysplasia or malignancy.  . Chronic diastolic heart failure (HCF[place]2016  . Chronic renal insufficiency, stage 3 (moderate) 2018   GFR 50s  . COPD (chronic obstructive pulmonary disease) ([place]   GOLD II.  Spirometry  2004 borderline obstruction; 2015 mod obst: noncompliant with bid symbicort so pulmonologist switched him to a once daily inhaler: Trelegy ellipta 12/2015.  Oxygen prn: goal 88-92%.  . Diabetic nephropathy ([place]CD[place]   Elevated urine microalb/cr 03/2011  . DM type 2 (diabetes mellitus, type 2) ([place]CL[place]   Poor control on max oral meds--pt eventually agreed to insulin therapy.  As of 2017 his DM is being managed by Dr.  KuDwyane Deen endo.  . DOE (dyspnea on exertion)    COPD + chronic diastolic HF  . Erectile dysfunction    Normal testosterone  . Hearing loss of both ears 2016   Hearing aids  . Hyperlipidemia   . Hyperplastic colon polyp 2001  . Hypertension   . Iron deficiency anemia 2014   03/2012 capsule endoscopy showed 2 AVMs--likely responsible for his IDA--lifetime iron supp recommended + q6m68moCs.  . Macular degeneration, dry    Mild, bilat (Optometrist, [place] [employer] [employer] [place]. Obesity   . Open toe wound 11/2015   Wound care clinic appt made and then canceled when toe improved.  . Other and unspecified angina pectoris   . PAD (peripheral artery disease) (HCCW[place]2/2018   Abnormal ABI's and waveforms: LE arterial duplex ordered for f/u as per cardiologist's recommendation.  Vasc eval by Dr. CheBridgett Larssonmpression was minimal PAD, recommended maximize med mgmt.  . PMarland KitchenF (paroxysmal atrial fibrillation) (HCCTipt[place]0/2016   xarelto  . Pericardial effusion with cardiac tamponade 6//29/15   pericardiocentesis was done,  Infectious/inflamm (cytology showed NO MALIGNANT CELLS)  . Pleural plaque    Pleural plaques/asbestosis changes on CT chest done by pulm 05/2016.  . PMarland Kitcheneumonia   . Tobacco dependence in remission  100+ pack-yr hx: quit after CABG    Past Surgical History:  Procedure Laterality Date  . APPENDECTOMY  1957  . CARDIAC CATHETERIZATION  08/08/2013  . CATARACT EXTRACTION W/ INTRAOCULAR LENS  IMPLANT, BILATERAL  04/08/2006 & 04/22/2006  . CATARACT EXTRACTION W/ INTRAOCULAR LENS  IMPLANT, BILATERAL Bilateral   . CHOLECYSTECTOMY OPEN  1999  . COLONOSCOPY  10/16/2011   Procedure: COLONOSCOPY;  Surgeon: Inda Castle, MD;  Location: [facility];  Service: Endoscopy;  Laterality: N/A;  . CORONARY ARTERY BYPASS GRAFT N/A 08/14/2013   Procedure: CORONARY ARTERY BYPASS GRAFTING (CABG);  Surgeon: Melrose Nakayama, MD;  Location: [facility];  Service: Open Heart Surgery;   Laterality: N/A;  Times 4   using left internal mammary artery and endoscopically harvested bilateral saphenous vein  . ESOPHAGOGASTRODUODENOSCOPY  02/25/12   Atrophic gastritis with a few erosions--capsule endoscopy planned as of 02/25/12 (Dr. Deatra Ina).  [name] HARDWARE REMOVAL Right 08/12/2012   Procedure: HARDWARE REMOVAL;  Surgeon: Mcarthur Rossetti, MD;  Location: [facility];  Service: Orthopedics;  Laterality: Right;  . HIP SURGERY Right 1954   Repair of slipped capital femoral epiphysis.  . INTRAOPERATIVE TRANSESOPHAGEAL ECHOCARDIOGRAM N/A 08/14/2013   Normal LV function. Procedure: INTRAOPERATIVE TRANSESOPHAGEAL ECHOCARDIOGRAM;  Surgeon: Melrose Nakayama, MD;  Location: [facility];  Service: Open Heart Surgery;  Laterality: N/A;  . LEFT HEART CATHETERIZATION WITH CORONARY ANGIOGRAM N/A 08/08/2013   Procedure: LEFT HEART CATHETERIZATION WITH CORONARY ANGIOGRAM;  Surgeon: Jettie Booze, MD;  Location: [facility] CATH LAB;  Service: Cardiovascular;  Laterality: N/A;  . PATELLA FRACTURE SURGERY Left ~ 1979   bolt + 3 screws to repair tib plateau fx  . PERICARDIAL TAP N/A 07/01/2013   Procedure: PERICARDIAL TAP;  Surgeon: Jettie Booze, MD;  [facility];  Service: Cardiovascular;  Laterality: N/A;  . RIGHT/LEFT HEART CATH AND CORONARY ANGIOGRAPHY N/A 06/10/2016   Procedure: Right/Left Heart Cath and Coronary Angiography;  Surgeon: Larey Dresser, MD;  Location: [place] CV LAB;  Service: Cardiovascular;  Laterality: N/A;  . TESTICLE SURGERY  as a child   Undescended testicle brought down into scrotum  . TONSILLECTOMY  1947  . TOTAL HIP ARTHROPLASTY Right 08/12/2012   Procedure: REMOVAL OF OLD PINS RIGHT HIP AND RIGHT TOTAL HIP ARTHROPLASTY ANTERIOR APPROACH;  Surgeon: Mcarthur Rossetti, MD;  Location: [facility];  Service: Orthopedics;  Laterality: Right;  . TRANSTHORACIC ECHOCARDIOGRAM  10/30/14   Mod LVH, EF 60-65%, normal wall motion, mod mitral regurg, mild PAH    Outpatient  Medications Prior to Visit  Medication Sig Dispense Refill  . ACCU-CHEK GUIDE test strip USE TO CHECK BLOOD SUGAR 3 TIMES DAILY 100 each 11  . ACCU-CHEK GUIDE test strip USE TO CHECK BLOOD SUGAR 3 TIMES DAILY 100 each 11  . acetaminophen (TYLENOL) 325 MG tablet Take 2 tablets (650 mg total) by mouth every 4 (four) hours as needed for headache or mild pain.    . Ascorbic Acid (VITAMIN C) 1000 MG tablet Take 1,000 mg by mouth daily.    [name] aspirin 81 MG tablet Take 81 mg by mouth at bedtime. Reported on 02/26/2015    . atorvastatin (LIPITOR) 40 MG tablet TAKE ONE TABLET BY MOUTH EVERY DAY 30 tablet 6  . bisoprolol (ZEBETA) 5 MG tablet Take 1 tablet (5 mg total) by mouth daily. 30 tablet 6  . budesonide-formoterol (SYMBICORT) 160-4.5 MCG/ACT inhaler Inhale 2 puffs into the lungs 2 (two) times daily. 1 Inhaler 12  . buPROPion (  WELLBUTRIN SR) 150 MG 12 hr tablet TAKE ONE TABLET BY MOUTH TWICE DAILY 60 tablet 6  . Cholecalciferol (VITAMIN D-3) 1000 units CAPS Take 1,000 Units by mouth daily. Reported on 02/26/2015    . citalopram (CELEXA) 20 MG tablet Take 1 tablet (20 mg total) by mouth daily. 30 tablet 2  . digoxin (LANOXIN) 0.125 MG tablet Take 1 tablet (0.125 mg total) by mouth daily. 30 tablet 6  . fluticasone (FLONASE) 50 MCG/ACT nasal spray Place 2 sprays into both nostrils daily. 16 g 1  . gabapentin (NEURONTIN) 100 MG capsule Take two capsules three times daily 180 capsule 6  . guaiFENesin (MUCINEX) 600 MG 12 hr tablet Take 2 tablets (1,200 mg total) by mouth 2 (two) times daily. 30 tablet 0  . Insulin Glargine (TOUJEO SOLOSTAR) 300 UNIT/ML SOPN Inject 70 Units into the skin daily. (Patient taking differently: Inject 80 Units into the skin daily. ) 6 pen 1  . insulin lispro (HUMALOG KWIKPEN) 100 UNIT/ML KiwkPen Inject 18 units at breakfast 20 units at lunch and 26 units at dinner (Patient taking differently: Inject 18 units at breakfast 20 units at lunch and 28 units at dinner) 5 mL 4  . isosorbide  mononitrate (IMDUR) 30 MG 24 hr tablet TAKE ONE TABLET BY MOUTH DAILY 90 tablet 1  . losartan (COZAAR) 25 MG tablet Take 0.5 tablets (12.5 mg total) by mouth daily. 15 tablet 5  . Multiple Vitamin (MULTIVITAMIN WITH MINERALS) TABS Take 1 tablet by mouth daily.    . nitroGLYCERIN (NITROSTAT) 0.4 MG SL tablet Place 1 tablet (0.4 mg total) under the tongue every 5 (five) minutes as needed for chest pain. 25 tablet 3  . potassium chloride SA (K-DUR,KLOR-CON) 20 MEQ tablet Take 1 tablet (20 mEq total) by mouth daily. 30 tablet 6  . PROAIR HFA 108 (90 Base) MCG/ACT inhaler INHALE 2 PUFFS INTO THE LUNGS EVERY 4 HOURS AS NEEDED FOR WHEEZING ORSHORTNESS OF BREATH 8.5 g 1  . rivaroxaban (XARELTO) 20 MG TABS tablet Take 1 tablet (20 mg total) by mouth daily with supper. 90 tablet 3  . spironolactone (ALDACTONE) 25 MG tablet Take 1 tablet (25 mg total) by mouth daily. 30 tablet 6  . SYNJARDY 05-998 MG TABS TAKE ONE TABLET BY MOUTH TWICE DAILY AFTER A MEAL 60 tablet 2  . tamsulosin (FLOMAX) 0.4 MG CAPS capsule TAKE 1 CAPSULE BY MOUTH EVERY DAY AFTER SUPPER 90 capsule 1  . torsemide (DEMADEX) 20 MG tablet Take 2 tablets (40 mg total) by mouth daily. 60 tablet 6   No facility-administered medications prior to visit.     Allergies  Allergen Reactions  . Morphine And Related Other (See Comments)    Drenched with perspiration  . Demerol [Meperidine] Nausea Only  . Starlix [Nateglinide] Other (See Comments)    gassy    ROS As per HPI  PE: Blood pressure (!) 96/57, pulse 60, temperature 98.4 F (36.9 C), temperature source Oral, resp. rate 16, weight 233 lb (105.7 kg), SpO2 92 %. Gen: Alert, well appearing.  Patient is oriented to person, place, time, and situation. AFFECT: pleasant, lucid thought and speech. No further exam today.  LABS:    Chemistry      Component Value Date/Time   NA 136 07/20/2016 0956   K 4.5 07/20/2016 0956   CL 102 07/20/2016 0956   CO2 22 07/20/2016 0956   BUN 19  07/20/2016 0956   CREATININE 1.31 (H) 07/20/2016 0956   CREATININE 1.01 10/31/2015 1229  Component Value Date/Time   CALCIUM 9.1 07/20/2016 0956   ALKPHOS 76 07/07/2016 0838   AST 17 07/07/2016 0838   ALT 21 07/07/2016 0838   BILITOT 0.5 07/07/2016 0838       IMPRESSION AND PLAN:  1) Chronic diastolic HF: stable---he is s/p a brief increase in diuretic. Recheck lytes/cr today.  2) Left arm SCC in situ. Referral to derm for general skin eval and monitoring and any needed f/u of his recent left arm SCC in situ that I shaved off last visit.  3) Chronic diarrhea; sx's wax and wane, but for the most part he does describe tendency towards loose BM's even when not having outright diarrhea.  Has an IBS component. Check stool studies today: giardia/crypto, fecal lactoferrin, C diff pcr, stool culture. Start levsin 0.125, 1-2 qid prn.  Therapeutic expectations and side effect profile of medication discussed today.  Patient's questions answered.  An After Visit Summary was printed and given to the patient.  FOLLOW UP: Return in about 3 months (around 11/10/2016) for routine chronic illness f/u.  Signed:  Crissie Sickles, MD           08/10/2016  ADDENDUM 08/17/16: Stool studies normal except positive fecal lactoferrin. Pt now states all diarrhea is gone and he is having normal BMs again.  Signed:  Crissie Sickles, MD           08/17/2016

## 2016-08-11 ENCOUNTER — Other Ambulatory Visit: Payer: Self-pay

## 2016-08-11 DIAGNOSIS — E1122 Type 2 diabetes mellitus with diabetic chronic kidney disease: Secondary | ICD-10-CM | POA: Diagnosis not present

## 2016-08-11 DIAGNOSIS — I130 Hypertensive heart and chronic kidney disease with heart failure and stage 1 through stage 4 chronic kidney disease, or unspecified chronic kidney disease: Secondary | ICD-10-CM | POA: Diagnosis not present

## 2016-08-11 DIAGNOSIS — Z794 Long term (current) use of insulin: Secondary | ICD-10-CM | POA: Diagnosis not present

## 2016-08-11 DIAGNOSIS — J449 Chronic obstructive pulmonary disease, unspecified: Secondary | ICD-10-CM | POA: Diagnosis not present

## 2016-08-11 DIAGNOSIS — M1991 Primary osteoarthritis, unspecified site: Secondary | ICD-10-CM | POA: Diagnosis not present

## 2016-08-11 DIAGNOSIS — N181 Chronic kidney disease, stage 1: Secondary | ICD-10-CM | POA: Diagnosis not present

## 2016-08-11 DIAGNOSIS — K529 Noninfective gastroenteritis and colitis, unspecified: Secondary | ICD-10-CM | POA: Diagnosis not present

## 2016-08-11 DIAGNOSIS — Z7901 Long term (current) use of anticoagulants: Secondary | ICD-10-CM | POA: Diagnosis not present

## 2016-08-11 DIAGNOSIS — Z951 Presence of aortocoronary bypass graft: Secondary | ICD-10-CM | POA: Diagnosis not present

## 2016-08-11 DIAGNOSIS — I4891 Unspecified atrial fibrillation: Secondary | ICD-10-CM | POA: Diagnosis not present

## 2016-08-11 DIAGNOSIS — Z7952 Long term (current) use of systemic steroids: Secondary | ICD-10-CM | POA: Diagnosis not present

## 2016-08-11 DIAGNOSIS — I2510 Atherosclerotic heart disease of native coronary artery without angina pectoris: Secondary | ICD-10-CM | POA: Diagnosis not present

## 2016-08-11 DIAGNOSIS — Z7982 Long term (current) use of aspirin: Secondary | ICD-10-CM | POA: Diagnosis not present

## 2016-08-11 DIAGNOSIS — I5041 Acute combined systolic (congestive) and diastolic (congestive) heart failure: Secondary | ICD-10-CM | POA: Diagnosis not present

## 2016-08-11 LAB — BASIC METABOLIC PANEL
BUN: 18 mg/dL (ref 7–25)
CALCIUM: 8.9 mg/dL (ref 8.6–10.3)
CO2: 21 mmol/L (ref 20–32)
CREATININE: 1.05 mg/dL (ref 0.70–1.18)
Chloride: 99 mmol/L (ref 98–110)
Glucose, Bld: 285 mg/dL — ABNORMAL HIGH (ref 65–99)
Potassium: 4.7 mmol/L (ref 3.5–5.3)
Sodium: 135 mmol/L (ref 135–146)

## 2016-08-11 NOTE — Addendum Note (Signed)
Addended by: Ralph Dowdy on: 08/11/2016 03:16 PM   Modules accepted: Orders

## 2016-08-11 NOTE — Patient Outreach (Signed)
[facility]) Care Management  08/11/2016  Joshua [name] 1940/06/30 [id number]  Subjective: Client reports he is doing fine. Denies issues/concerns.  Objective: none  Assessment: Client hospitalized 5/31-6/7 with respiratory failure, heart failure. Presented to the hospital 6/11 due to weakness, falls transferred to skilled facility [facility]) for rehabilitation. History of chronic diastolic CHF, COPD on home 02, COPD, history of asbestos exposure, HTN, HLD, and DM.  Follow up call. Joshua [name] completed follow up with primary care. No questions or concern. Joshua [name] reports one new medication, but has not gotten prescription filled. No significant change in weight.   Plan: follow up next month.  Thea Silversmith, RN, MSN, [place] Coordinator Cell: [phone]

## 2016-08-12 ENCOUNTER — Ambulatory Visit: Payer: Medicare Other | Admitting: Family Medicine

## 2016-08-12 DIAGNOSIS — N181 Chronic kidney disease, stage 1: Secondary | ICD-10-CM | POA: Diagnosis not present

## 2016-08-12 DIAGNOSIS — Z7982 Long term (current) use of aspirin: Secondary | ICD-10-CM | POA: Diagnosis not present

## 2016-08-12 DIAGNOSIS — I5041 Acute combined systolic (congestive) and diastolic (congestive) heart failure: Secondary | ICD-10-CM | POA: Diagnosis not present

## 2016-08-12 DIAGNOSIS — I130 Hypertensive heart and chronic kidney disease with heart failure and stage 1 through stage 4 chronic kidney disease, or unspecified chronic kidney disease: Secondary | ICD-10-CM | POA: Diagnosis not present

## 2016-08-12 DIAGNOSIS — M1991 Primary osteoarthritis, unspecified site: Secondary | ICD-10-CM | POA: Diagnosis not present

## 2016-08-12 DIAGNOSIS — I2510 Atherosclerotic heart disease of native coronary artery without angina pectoris: Secondary | ICD-10-CM | POA: Diagnosis not present

## 2016-08-12 DIAGNOSIS — Z951 Presence of aortocoronary bypass graft: Secondary | ICD-10-CM | POA: Diagnosis not present

## 2016-08-12 DIAGNOSIS — Z7901 Long term (current) use of anticoagulants: Secondary | ICD-10-CM | POA: Diagnosis not present

## 2016-08-12 DIAGNOSIS — Z794 Long term (current) use of insulin: Secondary | ICD-10-CM | POA: Diagnosis not present

## 2016-08-12 DIAGNOSIS — E1122 Type 2 diabetes mellitus with diabetic chronic kidney disease: Secondary | ICD-10-CM | POA: Diagnosis not present

## 2016-08-12 DIAGNOSIS — J449 Chronic obstructive pulmonary disease, unspecified: Secondary | ICD-10-CM | POA: Diagnosis not present

## 2016-08-12 DIAGNOSIS — I4891 Unspecified atrial fibrillation: Secondary | ICD-10-CM | POA: Diagnosis not present

## 2016-08-12 DIAGNOSIS — Z7952 Long term (current) use of systemic steroids: Secondary | ICD-10-CM | POA: Diagnosis not present

## 2016-08-12 LAB — FECAL LACTOFERRIN, QUANT: Lactoferrin: POSITIVE

## 2016-08-12 LAB — CLOSTRIDIUM DIFFICILE BY PCR: Toxigenic C. Difficile by PCR: NOT DETECTED

## 2016-08-13 DIAGNOSIS — I2510 Atherosclerotic heart disease of native coronary artery without angina pectoris: Secondary | ICD-10-CM | POA: Diagnosis not present

## 2016-08-13 DIAGNOSIS — Z7982 Long term (current) use of aspirin: Secondary | ICD-10-CM | POA: Diagnosis not present

## 2016-08-13 DIAGNOSIS — I4891 Unspecified atrial fibrillation: Secondary | ICD-10-CM | POA: Diagnosis not present

## 2016-08-13 DIAGNOSIS — M1991 Primary osteoarthritis, unspecified site: Secondary | ICD-10-CM | POA: Diagnosis not present

## 2016-08-13 DIAGNOSIS — Z951 Presence of aortocoronary bypass graft: Secondary | ICD-10-CM | POA: Diagnosis not present

## 2016-08-13 DIAGNOSIS — I5041 Acute combined systolic (congestive) and diastolic (congestive) heart failure: Secondary | ICD-10-CM | POA: Diagnosis not present

## 2016-08-13 DIAGNOSIS — J449 Chronic obstructive pulmonary disease, unspecified: Secondary | ICD-10-CM | POA: Diagnosis not present

## 2016-08-13 DIAGNOSIS — N181 Chronic kidney disease, stage 1: Secondary | ICD-10-CM | POA: Diagnosis not present

## 2016-08-13 DIAGNOSIS — Z7901 Long term (current) use of anticoagulants: Secondary | ICD-10-CM | POA: Diagnosis not present

## 2016-08-13 DIAGNOSIS — I130 Hypertensive heart and chronic kidney disease with heart failure and stage 1 through stage 4 chronic kidney disease, or unspecified chronic kidney disease: Secondary | ICD-10-CM | POA: Diagnosis not present

## 2016-08-13 DIAGNOSIS — E1122 Type 2 diabetes mellitus with diabetic chronic kidney disease: Secondary | ICD-10-CM | POA: Diagnosis not present

## 2016-08-13 DIAGNOSIS — Z7952 Long term (current) use of systemic steroids: Secondary | ICD-10-CM | POA: Diagnosis not present

## 2016-08-13 DIAGNOSIS — Z794 Long term (current) use of insulin: Secondary | ICD-10-CM | POA: Diagnosis not present

## 2016-08-15 LAB — STOOL CULTURE

## 2016-08-17 ENCOUNTER — Telehealth: Payer: Self-pay | Admitting: Family Medicine

## 2016-08-17 LAB — GIARDIA/CRYPTOSPORIDIUM (EIA)

## 2016-08-17 MED ORDER — CITALOPRAM HYDROBROMIDE 40 MG PO TABS: 40.0000 mg | ORAL_TABLET | Freq: Every day | ORAL | 3 refills | Status: DC

## 2016-08-17 NOTE — Telephone Encounter (Signed)
Patient's wife calling and would like to know if it is okay to double his citalopram?  Patient's wife would like to increase dose due to worsening depression.  Please advise.

## 2016-08-17 NOTE — Telephone Encounter (Signed)
Pt advised and voiced understanding.   

## 2016-08-17 NOTE — Telephone Encounter (Signed)
Yes, ok to double. I sent in rx for citalopram 48m to his pharmacy to fill when he runs out of his 231mtabs.-thx

## 2016-08-17 NOTE — Telephone Encounter (Signed)
Please advise. Thanks.  

## 2016-08-18 ENCOUNTER — Other Ambulatory Visit: Payer: Self-pay | Admitting: Family Medicine

## 2016-08-18 DIAGNOSIS — M1991 Primary osteoarthritis, unspecified site: Secondary | ICD-10-CM | POA: Diagnosis not present

## 2016-08-18 DIAGNOSIS — Z7952 Long term (current) use of systemic steroids: Secondary | ICD-10-CM | POA: Diagnosis not present

## 2016-08-18 DIAGNOSIS — I130 Hypertensive heart and chronic kidney disease with heart failure and stage 1 through stage 4 chronic kidney disease, or unspecified chronic kidney disease: Secondary | ICD-10-CM | POA: Diagnosis not present

## 2016-08-18 DIAGNOSIS — I5041 Acute combined systolic (congestive) and diastolic (congestive) heart failure: Secondary | ICD-10-CM | POA: Diagnosis not present

## 2016-08-18 DIAGNOSIS — I2510 Atherosclerotic heart disease of native coronary artery without angina pectoris: Secondary | ICD-10-CM | POA: Diagnosis not present

## 2016-08-18 DIAGNOSIS — Z951 Presence of aortocoronary bypass graft: Secondary | ICD-10-CM | POA: Diagnosis not present

## 2016-08-18 DIAGNOSIS — J449 Chronic obstructive pulmonary disease, unspecified: Secondary | ICD-10-CM | POA: Diagnosis not present

## 2016-08-18 DIAGNOSIS — Z7982 Long term (current) use of aspirin: Secondary | ICD-10-CM | POA: Diagnosis not present

## 2016-08-18 DIAGNOSIS — E1122 Type 2 diabetes mellitus with diabetic chronic kidney disease: Secondary | ICD-10-CM | POA: Diagnosis not present

## 2016-08-18 DIAGNOSIS — Z7901 Long term (current) use of anticoagulants: Secondary | ICD-10-CM | POA: Diagnosis not present

## 2016-08-18 DIAGNOSIS — N181 Chronic kidney disease, stage 1: Secondary | ICD-10-CM | POA: Diagnosis not present

## 2016-08-18 DIAGNOSIS — I4891 Unspecified atrial fibrillation: Secondary | ICD-10-CM | POA: Diagnosis not present

## 2016-08-18 DIAGNOSIS — Z794 Long term (current) use of insulin: Secondary | ICD-10-CM | POA: Diagnosis not present

## 2016-08-18 NOTE — Telephone Encounter (Signed)
[place].

## 2016-08-19 DIAGNOSIS — E1122 Type 2 diabetes mellitus with diabetic chronic kidney disease: Secondary | ICD-10-CM | POA: Diagnosis not present

## 2016-08-19 DIAGNOSIS — N181 Chronic kidney disease, stage 1: Secondary | ICD-10-CM | POA: Diagnosis not present

## 2016-08-19 DIAGNOSIS — Z7952 Long term (current) use of systemic steroids: Secondary | ICD-10-CM | POA: Diagnosis not present

## 2016-08-19 DIAGNOSIS — I2510 Atherosclerotic heart disease of native coronary artery without angina pectoris: Secondary | ICD-10-CM | POA: Diagnosis not present

## 2016-08-19 DIAGNOSIS — Z794 Long term (current) use of insulin: Secondary | ICD-10-CM | POA: Diagnosis not present

## 2016-08-19 DIAGNOSIS — Z7982 Long term (current) use of aspirin: Secondary | ICD-10-CM | POA: Diagnosis not present

## 2016-08-19 DIAGNOSIS — Z951 Presence of aortocoronary bypass graft: Secondary | ICD-10-CM | POA: Diagnosis not present

## 2016-08-19 DIAGNOSIS — M1991 Primary osteoarthritis, unspecified site: Secondary | ICD-10-CM | POA: Diagnosis not present

## 2016-08-19 DIAGNOSIS — Z7901 Long term (current) use of anticoagulants: Secondary | ICD-10-CM | POA: Diagnosis not present

## 2016-08-19 DIAGNOSIS — I130 Hypertensive heart and chronic kidney disease with heart failure and stage 1 through stage 4 chronic kidney disease, or unspecified chronic kidney disease: Secondary | ICD-10-CM | POA: Diagnosis not present

## 2016-08-19 DIAGNOSIS — I5041 Acute combined systolic (congestive) and diastolic (congestive) heart failure: Secondary | ICD-10-CM | POA: Diagnosis not present

## 2016-08-19 DIAGNOSIS — J449 Chronic obstructive pulmonary disease, unspecified: Secondary | ICD-10-CM | POA: Diagnosis not present

## 2016-08-19 DIAGNOSIS — I4891 Unspecified atrial fibrillation: Secondary | ICD-10-CM | POA: Diagnosis not present

## 2016-08-19 NOTE — Progress Notes (Signed)
Patient ID: Joshua [name], male   DOB: [birth year], 76 y.o.   MRN: [id number]            Reason for Appointment:  Follow-up for Type 2 Diabetes  Referring physician: [place]   History of Present Illness:          Date of diagnosis of type 2 diabetes mellitus: ?  2002        Background history:   He apparently had been treated with metformin initially which was continued until about 2016. He previously has also been tried on Amaryl, Actos, Farxiga and Victoza but he claims that these did not help his blood sugar and were stopped Appears to have been on insulin since about 2014 but he does not remember details. He has been taking mostly Levemir insulin as a basal insulin but was also on Lantus in 2015 for some time His best A1c has been 7.3, once in 2014 and another time in 2016, otherwise his A1c has been higher and as high as 10.8  Recent history:   INSULIN regimen is:   Toujeo 80 units in a.m., Humalog 18-20-28 units before meals tid   Non-insulin hypoglycemic drugs the patient is taking are: Synjardy 05/998 twice a day  His last A1c was down to 7.3 in 5/15 and now is back up to 9.3  Current management, blood sugar patterns and problems identified:  He had much higher blood sugars at least in the last month or so and not clear why  He says he has been taking his insulin doses exactly as directed and usually taking his evening insulin before eating also  He had called about blood sugars being high about a month ago, was not taking his regular insulin before breakfast but doing other injections  His TOUJEO was increased by 10 units and he was also switched to Humalog to help with postprandial hyperglycemia  However his FASTING readings are markedly increased and averaging about 260  Blood sugars rest of the day are generally higher but he is monitoring only after supper with average readings about 315  He does not think he has changed his diet and is even cutting back on  high-fat foods and regular ice cream  Does not have any readings after breakfast and lunch  Also taking Synjardy consistently as directed  Unable to exercise because of various physical limitations    Side effects from medications have been: None  Compliance with the medical regimen: Fair Hypoglycemia: Never    Glucose monitoring:  done 2 times a day         Glucometer:  Accu-Chek .      Blood Glucose readings with averages from download as below  Mean values apply above for all meters except median for One Touch  PRE-MEAL Fasting Lunch Dinner Bedtime Overall  Glucose range:  2 56-333    274-525    Mean/median: 260    320  292+/-54    Self-care: The diet that the patient has been following is: tries to limit Sweet drinks .     Meal times are:  Breakfast is at 7 a.m.  Dinner: 5-6 PM    Typical meal intake: Breakfast is usually eggs,  Sausage, oatmeal or grits.  Lunch is a sandwich, any evening he has meat and vegetables, For snacks he will have fruit or crackers                Dietician visit, most recent: 01/09/16 CDE visit: 12/18  Exercise: Unable to do any     Weight history:  Wt Readings from Last 3 Encounters:  08/20/16 229 lb (103.9 kg)  08/10/16 233 lb (105.7 kg)  08/03/16 236 lb 8 oz (107.3 kg)    Glycemic control:   Lab Results  Component Value Date   HGBA1C 9.3 08/20/2016   HGBA1C 7.3 (H) 06/04/2016   HGBA1C 7.5 05/06/2016   Lab Results  Component Value Date   MICROALBUR 1.4 07/07/2016   LDLCALC 44 02/19/2016   CREATININE 1.05 08/10/2016   Lab Results  Component Value Date   MICRALBCREAT 1.2 07/07/2016    Other active problems: See review of systems   Allergies as of 08/20/2016      Reactions   Morphine And Related Other (See Comments)   Drenched with perspiration   Demerol [meperidine] Nausea Only   Starlix [nateglinide] Other (See Comments)   gassy      Medication List       Accurate as of 08/20/16 11:52 AM. Always use  your most recent med list.          ACCU-CHEK GUIDE test strip Generic drug:  glucose blood USE TO CHECK BLOOD SUGAR 3 TIMES DAILY   ACCU-CHEK GUIDE test strip Generic drug:  glucose blood USE TO CHECK BLOOD SUGAR 3 TIMES DAILY   acetaminophen 325 MG tablet Commonly known as:  TYLENOL Take 2 tablets (650 mg total) by mouth every 4 (four) hours as needed for headache or mild pain.   aspirin 81 MG tablet Take 81 mg by mouth at bedtime. Reported on 02/26/2015   atorvastatin 40 MG tablet Commonly known as:  LIPITOR TAKE ONE TABLET BY MOUTH EVERY DAY   bisoprolol 5 MG tablet Commonly known as:  ZEBETA Take 1 tablet (5 mg total) by mouth daily.   budesonide-formoterol 160-4.5 MCG/ACT inhaler Commonly known as:  SYMBICORT Inhale 2 puffs into the lungs 2 (two) times daily.   buPROPion 150 MG 12 hr tablet Commonly known as:  WELLBUTRIN SR TAKE ONE TABLET BY MOUTH TWICE DAILY   citalopram 40 MG tablet Commonly known as:  CELEXA Take 1 tablet (40 mg total) by mouth daily.   digoxin 0.125 MG tablet Commonly known as:  LANOXIN Take 1 tablet (0.125 mg total) by mouth daily.   fluticasone 50 MCG/ACT nasal spray Commonly known as:  FLONASE Place 2 sprays into both nostrils daily.   gabapentin 100 MG capsule Commonly known as:  NEURONTIN Take two capsules three times daily   guaiFENesin 600 MG 12 hr tablet Commonly known as:  MUCINEX Take 2 tablets (1,200 mg total) by mouth 2 (two) times daily.   hyoscyamine 0.125 MG SL tablet Commonly known as:  LEVSIN SL 1-2 tabs po q6h prn diarrhea   Insulin Glargine 300 UNIT/ML Sopn Commonly known as:  TOUJEO SOLOSTAR Inject 70 Units into the skin daily.   insulin lispro 100 UNIT/ML KiwkPen Commonly known as:  HUMALOG KWIKPEN Inject 18 units at breakfast 20 units at lunch and 26 units at dinner   isosorbide mononitrate 30 MG 24 hr tablet Commonly known as:  IMDUR TAKE ONE TABLET BY MOUTH DAILY   losartan 25 MG  tablet Commonly known as:  COZAAR Take 0.5 tablets (12.5 mg total) by mouth daily.   multivitamin with minerals Tabs tablet Take 1 tablet by mouth daily.   nitroGLYCERIN 0.4 MG SL tablet Commonly known as:  NITROSTAT Place 1 tablet (0.4 mg total) under the tongue every 5 (five) minutes as needed  for chest pain.   potassium chloride SA 20 MEQ tablet Commonly known as:  K-DUR,KLOR-CON Take 1 tablet (20 mEq total) by mouth daily.   PROAIR HFA 108 (90 Base) MCG/ACT inhaler Generic drug:  albuterol INHALE 2 PUFFS INTO THE LUNGS EVERY 4 HOURS AS NEEDED FOR WHEEZING ORSHORTNESS OF BREATH   rivaroxaban 20 MG Tabs tablet Commonly known as:  XARELTO Take 1 tablet (20 mg total) by mouth daily with supper.   spironolactone 25 MG tablet Commonly known as:  ALDACTONE Take 1 tablet (25 mg total) by mouth daily.   SYNJARDY 05-998 MG Tabs Generic drug:  Empagliflozin-Metformin HCl TAKE ONE TABLET BY MOUTH TWICE DAILY AFTER A MEAL   tamsulosin 0.4 MG Caps capsule Commonly known as:  FLOMAX TAKE 1 CAPSULE BY MOUTH EVERY DAY AFTER SUPPER   torsemide 20 MG tablet Commonly known as:  DEMADEX Take 2 tablets (40 mg total) by mouth daily.   vitamin C 1000 MG tablet Take 1,000 mg by mouth daily.   Vitamin D-3 1000 units Caps Take 1,000 Units by mouth daily. Reported on 02/26/2015       Allergies:  Allergies  Allergen Reactions  . Morphine And Related Other (See Comments)    Drenched with perspiration  . Demerol [Meperidine] Nausea Only  . Starlix [Nateglinide] Other (See Comments)    gassy    Past Medical History:  Diagnosis Date  . Acute respiratory distress 06/04/2016  . Adenomatous colon polyp 10/16/2011   Repeat 2018  . Arthritis     Hips, R>L & KNEES  . CAD, multiple vessel    3V CAD cath 08/08/13----CABG done shortly after.  . 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 pylori neg, no dysplasia or malignancy.  . Chronic diastolic heart  failure ([place]) 2016  . Chronic renal insufficiency, stage 3 (moderate) 2018   GFR 50s  . COPD (chronic obstructive pulmonary disease) ([place])    GOLD II.  Spirometry  2004 borderline obstruction; 2015 mod obst: noncompliant with bid symbicort so pulmonologist switched him to a once daily inhaler: Trelegy ellipta 12/2015.  Oxygen prn: goal 88-92%.  . Diabetic nephropathy ([place])    Elevated urine microalb/cr 03/2011  . DM type 2 (diabetes mellitus, type 2) ([place])    Poor control on max oral meds--pt eventually agreed to insulin therapy.  As of 2017 his DM is being managed by Dr. Dwyane Dee in endo.  . DOE (dyspnea on exertion)    COPD + chronic diastolic HF  . Erectile dysfunction    Normal testosterone  . Hearing loss of both ears 2016   Hearing aids  . Hyperlipidemia   . Hyperplastic colon polyp 2001  . Hypertension   . Iron deficiency anemia 2014   03/2012 capsule endoscopy showed 2 AVMs--likely responsible for his IDA--lifetime iron supp recommended + q63moCBCs.  . Macular degeneration, dry    Mild, bilat (Optometrist, [place] [employer] [place]  . Obesity   . Open toe wound 11/2015   Wound care clinic appt made and then canceled when toe improved.  . Other and unspecified angina pectoris   . PAD (peripheral artery disease) ([place] 02/2016   Abnormal ABI's and waveforms: LE arterial duplex ordered for f/u as per cardiologist's recommendation.  Vasc eval by Dr. CBridgett Larsson impression was minimal PAD, recommended maximize med mgmt.  .Marland KitchenPAF (paroxysmal atrial fibrillation) ([place] 10/2014   xarelto  . Pericardial effusion with cardiac tamponade 6//29/15  pericardiocentesis was done,  Infectious/inflamm (cytology showed NO MALIGNANT CELLS)  . Pleural plaque    Pleural plaques/asbestosis changes on CT chest done by pulm 05/2016.  [name] Pneumonia   . Tobacco dependence in remission    100+ pack-yr hx: quit after CABG    Past Surgical History:  Procedure Laterality Date  .  APPENDECTOMY  1957  . CARDIAC CATHETERIZATION  08/08/2013  . CATARACT EXTRACTION W/ INTRAOCULAR LENS  IMPLANT, BILATERAL  04/08/2006 & 04/22/2006  . CATARACT EXTRACTION W/ INTRAOCULAR LENS  IMPLANT, BILATERAL Bilateral   . CHOLECYSTECTOMY OPEN  1999  . COLONOSCOPY  10/16/2011   Procedure: COLONOSCOPY;  Surgeon: Inda Castle, MD;  Location: [facility];  Service: Endoscopy;  Laterality: N/A;  . CORONARY ARTERY BYPASS GRAFT N/A 08/14/2013   Procedure: CORONARY ARTERY BYPASS GRAFTING (CABG);  Surgeon: Melrose Nakayama, MD;  Location: [facility];  Service: Open Heart Surgery;  Laterality: N/A;  Times 4   using left internal mammary artery and endoscopically harvested bilateral saphenous vein  . ESOPHAGOGASTRODUODENOSCOPY  02/25/12   Atrophic gastritis with a few erosions--capsule endoscopy planned as of 02/25/12 (Dr. Deatra Ina).  [name] HARDWARE REMOVAL Right 08/12/2012   Procedure: HARDWARE REMOVAL;  Surgeon: Mcarthur Rossetti, MD;  Location: [facility];  Service: Orthopedics;  Laterality: Right;  . HIP SURGERY Right 1954   Repair of slipped capital femoral epiphysis.  . INTRAOPERATIVE TRANSESOPHAGEAL ECHOCARDIOGRAM N/A 08/14/2013   Normal LV function. Procedure: INTRAOPERATIVE TRANSESOPHAGEAL ECHOCARDIOGRAM;  Surgeon: Melrose Nakayama, MD;  Location: [facility];  Service: Open Heart Surgery;  Laterality: N/A;  . LEFT HEART CATHETERIZATION WITH CORONARY ANGIOGRAM N/A 08/08/2013   Procedure: LEFT HEART CATHETERIZATION WITH CORONARY ANGIOGRAM;  Surgeon: Jettie Booze, MD;  Location: [facility] CATH LAB;  Service: Cardiovascular;  Laterality: N/A;  . PATELLA FRACTURE SURGERY Left ~ 1979   bolt + 3 screws to repair tib plateau fx  . PERICARDIAL TAP N/A 07/01/2013   Procedure: PERICARDIAL TAP;  Surgeon: Jettie Booze, MD;  Location: [facility] CATH LAB;  Service: Cardiovascular;  Laterality: N/A;  . RIGHT/LEFT HEART CATH AND CORONARY ANGIOGRAPHY N/A 06/10/2016   Procedure: Right/Left Heart Cath and Coronary Angiography;   Surgeon: Larey Dresser, MD;  Location: [facility] CV LAB;  Service: Cardiovascular;  Laterality: N/A;  . TESTICLE SURGERY  as a child   Undescended testicle brought down into scrotum  . TONSILLECTOMY  1947  . TOTAL HIP ARTHROPLASTY Right 08/12/2012   Procedure: REMOVAL OF OLD PINS RIGHT HIP AND RIGHT TOTAL HIP ARTHROPLASTY ANTERIOR APPROACH;  Surgeon: Mcarthur Rossetti, MD;  Location: [facility];  Service: Orthopedics;  Laterality: Right;  . TRANSTHORACIC ECHOCARDIOGRAM  10/30/14   Mod LVH, EF 60-65%, normal wall motion, mod mitral regurg, mild PAH    Family History  Problem Relation Age of Onset  . Heart disease Father   . Heart attack Father   . CVA Mother   . Hypertension Mother   . Diabetes Paternal Grandmother   . Breast cancer Sister   . Diabetes Maternal Uncle   . Heart disease Maternal Uncle   . Stroke Neg Hx     Social History:  reports that he quit smoking about 9 months ago. His smoking use included Cigarettes. He has a 15.50 pack-year smoking history. He quit smokeless tobacco use about 3 years ago. He reports that he does not drink alcohol or use drugs.   Review of Systems   Lipid history: He had been prescribed Lipitor 40 mg  Has  adequate control of LDL    Lab Results  Component Value Date   CHOL 119 02/19/2016   HDL 43.30 02/19/2016   LDLCALC 44 02/19/2016   LDLDIRECT 187.0 11/21/2015   TRIG 159.0 (H) 02/19/2016   CHOLHDL 3 02/19/2016           Hypertension:Currently treated with bisoprolol 5 mg,25 mg losartan  Also on diuretics for CHF   BP Readings from Last 3 Encounters:  08/20/16 (!) 122/56  08/10/16 (!) 96/57  08/03/16 (!) 123/55     Most recent eye exam was 7/17, Dr Marina Gravel? Has had no retinopathy  Most recent foot exam: 12/17  History of depression on 2 drugs, recently controlled  LABS:  Office Visit on 08/20/2016  Component Date Value Ref Range Status  . Hemoglobin A1C 08/20/2016 9.3   Final    Physical Examination:  BP (!)  122/56   Pulse 91   Ht _0  (1.803 m)   Wt 229 lb (103.9 kg)   SpO2 92%   BMI 31.94 kg/m       ASSESSMENT:  Diabetes type 2, uncontrolled    See history of present illness for detailed discussion of current diabetes management, blood sugar patterns and problems identified  His blood sugars have been very high and difficult to know why they have gone up significantly However on his last visit he had not been eating as much Not clear why his fasting readings are much higher than usual, has not required more than 80 units of Toujeo in the past He was changed from regular insulin to Humalog but this should not make as much difference in his insulin requirement He thinks he is doing his insulin injections consistently as directed including Toujeo and has not had any insulin pens going bad A1c has gone up 2% and previously was down to 7.3   HYPERTENSION: Blood pressure is low normal on standing   Renal function: His last level is improved and he can continue Synjardy  PLAN:     He will change Humalog back to Regular Insulin for now to extend the duration of action of the time insulin  Also will increase the doses to 22 units at breakfast and lunch and 34 at suppertime  His blood sugars should improve at least after supper where he has the highest readings  For better actions of the basal insulin Injection he will split the Toujeo to twice a day   He will start with 50 units twice a day and call next week to report his blood sugars  May also potentially switch him to the higher dose Toujeo pen  Cannot increase his Jardiance dosage currently because of his low normal blood pressure on standing up  Discussed blood sugar targets  Again advised him to watch his meals for higher sugar and carbohydrate content as well as high-fat foods  He will try to be very consistent with taking mealtime insulin before his meals and preferably 30 minutes before on the Regular Insulin  Will  need short-term follow-up in 6 weeks to reassess his insulin requirement    Patient Instructions  Toujeo 50 units 2x daily  Humalog 22 in am and lunch and 34 at supper, change to R  Check blood sugars on waking up  5/7 days   Also check blood sugars about 2 hours after a meal and do this after different meals by rotation  Recommended blood sugar levels on waking up is 90-130 and about 2 hours after  meal is 130-180  Please bring your blood sugar monitor to each visit, thank you  Call sugars in 1 week    Counseling time on subjects discussed in assessment and plan sections is over 50% of today's 25 minute visit   Deklyn Trachtenberg 08/20/2016, 11:52 AM   Note: This office note was prepared with Estate agent. Any transcriptional errors that result from this process are unintentional.

## 2016-08-20 ENCOUNTER — Ambulatory Visit (INDEPENDENT_AMBULATORY_CARE_PROVIDER_SITE_OTHER): Payer: Medicare Other | Admitting: Endocrinology

## 2016-08-20 ENCOUNTER — Encounter: Payer: Self-pay | Admitting: Endocrinology

## 2016-08-20 VITALS — BP 104/46 | HR 91 | Ht 71.0 in | Wt 229.0 lb

## 2016-08-20 DIAGNOSIS — M1991 Primary osteoarthritis, unspecified site: Secondary | ICD-10-CM | POA: Diagnosis not present

## 2016-08-20 DIAGNOSIS — E1122 Type 2 diabetes mellitus with diabetic chronic kidney disease: Secondary | ICD-10-CM | POA: Diagnosis not present

## 2016-08-20 DIAGNOSIS — I951 Orthostatic hypotension: Secondary | ICD-10-CM

## 2016-08-20 DIAGNOSIS — E1165 Type 2 diabetes mellitus with hyperglycemia: Secondary | ICD-10-CM | POA: Diagnosis not present

## 2016-08-20 DIAGNOSIS — I130 Hypertensive heart and chronic kidney disease with heart failure and stage 1 through stage 4 chronic kidney disease, or unspecified chronic kidney disease: Secondary | ICD-10-CM | POA: Diagnosis not present

## 2016-08-20 DIAGNOSIS — I5041 Acute combined systolic (congestive) and diastolic (congestive) heart failure: Secondary | ICD-10-CM | POA: Diagnosis not present

## 2016-08-20 DIAGNOSIS — Z794 Long term (current) use of insulin: Secondary | ICD-10-CM | POA: Diagnosis not present

## 2016-08-20 DIAGNOSIS — J449 Chronic obstructive pulmonary disease, unspecified: Secondary | ICD-10-CM | POA: Diagnosis not present

## 2016-08-20 DIAGNOSIS — Z951 Presence of aortocoronary bypass graft: Secondary | ICD-10-CM | POA: Diagnosis not present

## 2016-08-20 DIAGNOSIS — Z7982 Long term (current) use of aspirin: Secondary | ICD-10-CM | POA: Diagnosis not present

## 2016-08-20 DIAGNOSIS — Z7901 Long term (current) use of anticoagulants: Secondary | ICD-10-CM | POA: Diagnosis not present

## 2016-08-20 DIAGNOSIS — Z7952 Long term (current) use of systemic steroids: Secondary | ICD-10-CM | POA: Diagnosis not present

## 2016-08-20 DIAGNOSIS — N181 Chronic kidney disease, stage 1: Secondary | ICD-10-CM | POA: Diagnosis not present

## 2016-08-20 DIAGNOSIS — I2510 Atherosclerotic heart disease of native coronary artery without angina pectoris: Secondary | ICD-10-CM | POA: Diagnosis not present

## 2016-08-20 DIAGNOSIS — I4891 Unspecified atrial fibrillation: Secondary | ICD-10-CM | POA: Diagnosis not present

## 2016-08-20 LAB — POCT GLYCOSYLATED HEMOGLOBIN (HGB A1C): Hemoglobin A1C: 9.3

## 2016-08-20 NOTE — Patient Instructions (Addendum)
Toujeo 50 units 2x daily  Humalog 22 in am and lunch and 34 at supper, change to R  Check blood sugars on waking up  5/7 days   Also check blood sugars about 2 hours after a meal and do this after different meals by rotation  Recommended blood sugar levels on waking up is 90-130 and about 2 hours after meal is 130-180  Please bring your blood sugar monitor to each visit, thank you  Call sugars in 1 week

## 2016-08-24 ENCOUNTER — Ambulatory Visit (HOSPITAL_COMMUNITY)
Admission: RE | Admit: 2016-08-24 | Discharge: 2016-08-24 | Disposition: A | Discharge status: Home / Self Care | Payer: Medicare Other | Source: Ambulatory Visit | Attending: Cardiology | Admitting: Cardiology

## 2016-08-24 ENCOUNTER — Encounter (HOSPITAL_COMMUNITY): Payer: Self-pay | Admitting: Cardiology

## 2016-08-24 VITALS — BP 127/53 | HR 65 | Wt 236.2 lb

## 2016-08-24 DIAGNOSIS — I5032 Chronic diastolic (congestive) heart failure: Secondary | ICD-10-CM

## 2016-08-24 DIAGNOSIS — Z885 Allergy status to narcotic agent status: Secondary | ICD-10-CM | POA: Insufficient documentation

## 2016-08-24 DIAGNOSIS — E119 Type 2 diabetes mellitus without complications: Secondary | ICD-10-CM | POA: Diagnosis not present

## 2016-08-24 DIAGNOSIS — R55 Syncope and collapse: Secondary | ICD-10-CM | POA: Insufficient documentation

## 2016-08-24 DIAGNOSIS — J449 Chronic obstructive pulmonary disease, unspecified: Secondary | ICD-10-CM | POA: Insufficient documentation

## 2016-08-24 DIAGNOSIS — J438 Other emphysema: Secondary | ICD-10-CM | POA: Diagnosis not present

## 2016-08-24 DIAGNOSIS — E785 Hyperlipidemia, unspecified: Secondary | ICD-10-CM | POA: Insufficient documentation

## 2016-08-24 DIAGNOSIS — Z77090 Contact with and (suspected) exposure to asbestos: Secondary | ICD-10-CM | POA: Diagnosis not present

## 2016-08-24 DIAGNOSIS — Z87891 Personal history of nicotine dependence: Secondary | ICD-10-CM | POA: Diagnosis not present

## 2016-08-24 DIAGNOSIS — Z79899 Other long term (current) drug therapy: Secondary | ICD-10-CM | POA: Insufficient documentation

## 2016-08-24 DIAGNOSIS — Z8249 Family history of ischemic heart disease and other diseases of the circulatory system: Secondary | ICD-10-CM | POA: Diagnosis not present

## 2016-08-24 DIAGNOSIS — Z7982 Long term (current) use of aspirin: Secondary | ICD-10-CM | POA: Diagnosis not present

## 2016-08-24 DIAGNOSIS — Z794 Long term (current) use of insulin: Secondary | ICD-10-CM | POA: Insufficient documentation

## 2016-08-24 DIAGNOSIS — Z7951 Long term (current) use of inhaled steroids: Secondary | ICD-10-CM | POA: Insufficient documentation

## 2016-08-24 DIAGNOSIS — Z823 Family history of stroke: Secondary | ICD-10-CM | POA: Diagnosis not present

## 2016-08-24 DIAGNOSIS — Z7901 Long term (current) use of anticoagulants: Secondary | ICD-10-CM | POA: Diagnosis not present

## 2016-08-24 DIAGNOSIS — I110 Hypertensive heart disease with heart failure: Secondary | ICD-10-CM | POA: Diagnosis not present

## 2016-08-24 DIAGNOSIS — I480 Paroxysmal atrial fibrillation: Secondary | ICD-10-CM | POA: Diagnosis not present

## 2016-08-24 DIAGNOSIS — Z951 Presence of aortocoronary bypass graft: Secondary | ICD-10-CM | POA: Insufficient documentation

## 2016-08-24 DIAGNOSIS — I5022 Chronic systolic (congestive) heart failure: Secondary | ICD-10-CM | POA: Diagnosis not present

## 2016-08-24 DIAGNOSIS — I2510 Atherosclerotic heart disease of native coronary artery without angina pectoris: Secondary | ICD-10-CM | POA: Diagnosis not present

## 2016-08-24 LAB — BASIC METABOLIC PANEL
ANION GAP: 10 (ref 5–15)
BUN: 19 mg/dL (ref 6–20)
CALCIUM: 8.8 mg/dL — AB (ref 8.9–10.3)
CO2: 24 mmol/L (ref 22–32)
Chloride: 104 mmol/L (ref 101–111)
Creatinine, Ser: 1.25 mg/dL — ABNORMAL HIGH (ref 0.61–1.24)
GFR, EST NON AFRICAN AMERICAN: 54 mL/min — AB (ref >60)
Glucose, Bld: 282 mg/dL — ABNORMAL HIGH (ref 65–99)
POTASSIUM: 4.4 mmol/L (ref 3.5–5.1)
Sodium: 138 mmol/L (ref 135–145)

## 2016-08-24 LAB — CBC
HCT: 42.1 % (ref 39.0–52.0)
HEMOGLOBIN: 13.2 g/dL (ref 13.0–17.0)
MCH: 26.2 pg (ref 26.0–34.0)
MCHC: 31.4 g/dL (ref 30.0–36.0)
MCV: 83.5 fL (ref 78.0–100.0)
Platelets: 177 10*3/uL (ref 150–400)
RBC: 5.04 MIL/uL (ref 4.22–5.81)
RDW: 17.7 % — ABNORMAL HIGH (ref 11.5–15.5)
WBC: 8.1 10*3/uL (ref 4.0–10.5)

## 2016-08-24 LAB — DIGOXIN LEVEL: DIGOXIN LVL: 0.7 ng/mL — AB (ref 0.8–2.0)

## 2016-08-24 MED ORDER — SACUBITRIL-VALSARTAN 24-26 MG PO TABS: 1.0000 | ORAL_TABLET | Freq: Two times a day (BID) | ORAL | 3 refills | Status: DC

## 2016-08-24 NOTE — Progress Notes (Signed)
Advanced Heart Failure Clinic Note   Primary Care: Dr. Anitra Lauth Primary Cardiologist: Dr. [name]  HPI:  Joshua [name] is a 76 y.o. 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   Admitted 5/31 -> 06/11/16 with worsening SOB, volume overload, and increased 02 demand. Echo showed EF down to 25-30%.  Diuresed on IV lasix and given solumedrol. Thought to be mixed AECOPD and acute CHF. Moved to stepdown 06/05/16 with worsening resp status and BiPAP requirement. Improved with nebs, steroids, ABX and IV lasix + metolazone. Sent home on [place] and prednisone taper. Overall diuresed 22 lbs. He had left and right heart cath with patent grafts and no interventional target.  Discharge weight 233 lbs.   Pt admitted from 6/11 -> 06/17/16 with syncope (unwitnessed) Noted to have fall x 3. EKG with LBBB. Creatinine mildly elevated. CT head negative for acute process. R hip xray negative for fracture or dislocated hardware.  Felt to be vasovagal. Received NS and diuretics held up to discharge.  Creatinine improved.  He is stable clinically.  No longer using oxygen.  He is not smoking (quit in 10/17).  He is stably short of breath walking 200 feet or walking up a flight of steps.  Occasional coughing.  No wheezing.  No chest pain.  No orthopnea/PND.  No further lightheadedness or syncope.  BP stable.  Weight is up about 4-5 lbs over the last week at home though weight in the office today is stable.   ECG (personally reviewed): NSR, LBBB QRS 160 msec, 1st degree AVB  Labs (8/18): K 4.7, creatinine 1.05  PMH:  1. CAD: s/p CABG in 8/15.  - LHC (6/18) with 80% distal LM, TO pLAD, TO ostial LCx, subtotal occlusion distal RCA.  LIMA-LAD patent, Y graft to OM and D patent (50% stenosis in graft at touchdown on OM), patent SVG-PDA.  2. Chronic systolic CHF: Ischemic cardiomyopathy.  Echo (6/18) with EF 25-30%, diffuse HK, moderate MR, mildly dilated RV with mildly decreased  systolic function, PASP 48 mmHg.  - RHC (6/18): mean RA 10, PA 45/26, mean PCWP 21, CI 2.11 (Fick) and 2.41 (thermo), PVR 2.9 WU.  3. COPD: Prior smoker.   4. Hyperlipidemia 5. Atrial fibrillation: Paroxysmal.  6. Type II diabetes 7. HTN 8. Anemia: h/o Fe deficiency 9. 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  10. H/o pericardial effusion with tamponade in 6/15.  11. HTN  Review of systems complete and found to be negative unless listed in HPI.     Current Outpatient Prescriptions  Medication Sig Dispense Refill  . ACCU-CHEK GUIDE test strip USE TO CHECK BLOOD SUGAR 3 TIMES DAILY 100 each 11  . ACCU-CHEK GUIDE test strip USE TO CHECK BLOOD SUGAR 3 TIMES DAILY 100 each 11  . acetaminophen (TYLENOL) 325 MG tablet Take 2 tablets (650 mg total) by mouth every 4 (four) hours as needed for headache or mild pain.    . Ascorbic Acid (VITAMIN C) 1000 MG tablet Take 1,000 mg by mouth daily.    [name] aspirin 81 MG tablet Take 81 mg by mouth at bedtime. Reported on 02/26/2015    . atorvastatin (LIPITOR) 40 MG tablet TAKE ONE TABLET BY MOUTH EVERY DAY 30 tablet 6  . bisoprolol (ZEBETA) 5 MG tablet Take 1 tablet (5 mg total) by mouth daily. 30 tablet 6  . budesonide-formoterol (SYMBICORT) 160-4.5 MCG/ACT inhaler Inhale 2 puffs into the lungs 2 (  two) times daily. 1 Inhaler 12  . buPROPion (WELLBUTRIN SR) 150 MG 12 hr tablet TAKE ONE TABLET BY MOUTH TWICE DAILY 60 tablet 5  . Cholecalciferol (VITAMIN D-3) 1000 units CAPS Take 1,000 Units by mouth daily. Reported on 02/26/2015    . citalopram (CELEXA) 40 MG tablet Take 1 tablet (40 mg total) by mouth daily. 30 tablet 3  . digoxin (LANOXIN) 0.125 MG tablet Take 1 tablet (0.125 mg total) by mouth daily. 30 tablet 6  . fluticasone (FLONASE) 50 MCG/ACT nasal spray Place 2 sprays into both nostrils daily. 16 g 1  . gabapentin (NEURONTIN) 100 MG capsule Take two capsules three times daily 180 capsule 6  . guaiFENesin (MUCINEX)  600 MG 12 hr tablet Take 2 tablets (1,200 mg total) by mouth 2 (two) times daily. 30 tablet 0  . hyoscyamine (LEVSIN SL) 0.125 MG SL tablet 1-2 tabs po q6h prn diarrhea 30 tablet 1  . Insulin Glargine (TOUJEO SOLOSTAR) 300 UNIT/ML SOPN Inject 70 Units into the skin daily. (Patient taking differently: Inject 80 Units into the skin daily. ) 6 pen 1  . insulin lispro (HUMALOG KWIKPEN) 100 UNIT/ML KiwkPen Inject 18 units at breakfast 20 units at lunch and 26 units at dinner (Patient taking differently: Inject 18 units at breakfast 20 units at lunch and 28 units at dinner) 5 mL 4  . isosorbide mononitrate (IMDUR) 30 MG 24 hr tablet TAKE ONE TABLET BY MOUTH DAILY 90 tablet 1  . Multiple Vitamin (MULTIVITAMIN WITH MINERALS) TABS Take 1 tablet by mouth daily.    . potassium chloride SA (K-DUR,KLOR-CON) 20 MEQ tablet Take 1 tablet (20 mEq total) by mouth daily. 30 tablet 6  . PROAIR HFA 108 (90 Base) MCG/ACT inhaler INHALE 2 PUFFS INTO THE LUNGS EVERY 4 HOURS AS NEEDED FOR WHEEZING ORSHORTNESS OF BREATH 8.5 g 1  . rivaroxaban (XARELTO) 20 MG TABS tablet Take 1 tablet (20 mg total) by mouth daily with supper. 90 tablet 3  . spironolactone (ALDACTONE) 25 MG tablet Take 1 tablet (25 mg total) by mouth daily. 30 tablet 6  . SYNJARDY 05-998 MG TABS TAKE ONE TABLET BY MOUTH TWICE DAILY AFTER A MEAL 60 tablet 2  . tamsulosin (FLOMAX) 0.4 MG CAPS capsule TAKE 1 CAPSULE BY MOUTH EVERY DAY AFTER SUPPER 90 capsule 1  . torsemide (DEMADEX) 20 MG tablet Take 2 tablets (40 mg total) by mouth daily. 60 tablet 6  . nitroGLYCERIN (NITROSTAT) 0.4 MG SL tablet Place 1 tablet (0.4 mg total) under the tongue every 5 (five) minutes as needed for chest pain. (Patient not taking: Reported on 08/24/2016) 25 tablet 3  . sacubitril-valsartan (ENTRESTO) 24-26 MG Take 1 tablet by mouth 2 (two) times daily. 60 tablet 3   No current facility-administered medications for this encounter.     Allergies  Allergen Reactions  . Morphine And  Related Other (See Comments)    Drenched with perspiration  . Demerol [Meperidine] Nausea Only  . Starlix [Nateglinide] Other (See Comments)    gassy      Social History   Social History  . Marital status: Married    Spouse name: [name]  . Number of children: 4  . Years of education: N/A   Occupational History  . bus operator    Social History Main Topics  . Smoking status: Former Smoker    Packs/day: 0.25    Years: 62.00    Types: Cigarettes    Quit date: 10/23/2015  . Smokeless tobacco: Former Systems developer  Quit date: 06/30/2013  . Alcohol use No  . Drug use: No  . Sexual activity: No   Other Topics Concern  . Not on file   Social History Narrative   Married, 4 children.   Formerly a Medical illustrator.   Level of education: HS.     +tobacco--lifelong/ quit 2015).  No alcohol or drugs.   No exercise.  +excessive caffeine.      Family History  Problem Relation Age of Onset  . Heart disease Father   . Heart attack Father   . CVA Mother   . Hypertension Mother   . Diabetes Paternal Grandmother   . Breast cancer Sister   . Diabetes Maternal Uncle   . Heart disease Maternal Uncle   . Stroke Neg Hx     Vitals:   08/24/16 1003  BP: (!) 127/53  Pulse: 65  SpO2: 95%  Weight: 236 lb 4 oz (107.2 kg)   Wt Readings from Last 3 Encounters:  08/24/16 236 lb 4 oz (107.2 kg)  08/20/16 229 lb (103.9 kg)  08/10/16 233 lb (105.7 kg)     PHYSICAL EXAM: General: NAD Neck: No JVD, no thyromegaly or thyroid nodule.  Lungs: Decreased breath sounds bilaterally.  CV: Nondisplaced PMI.  Heart regular S1/S2, no S3/S4, no murmur.  Trace ankle edema.  No carotid bruit.  Normal pedal pulses.  Abdomen: Soft, nontender, no hepatosplenomegaly, no distention.  Skin: Intact without lesions or rashes.  Neurologic: Alert and oriented x 3.  Psych: Normal affect. Extremities: No clubbing or cyanosis.  HEENT: Normal.   ASSESSMENT & PLAN:  1. Chronic systolic CHF:  Ischemic cardiomyopathy.  Echo (6/18) with EF 25-30%.  NYHA class III symptoms, stable.  Weight is up at home but he does not appear significantly volume overloaded by exam.  - Continue torsemide 40 mg daily and spironolactone 25 mg daily.  BMET today.  - Stop losartan, start Entresto 24/26 bid.  This will give some additional diuresis.  BMET 2 wks.  - Continue digoxin, check level today.  - Continue bisoprolol 5 mg daily.  - Repeat echo in 9/18.  If EF remains low, he will qualify for CRT-D placement with wide LBBB.  2. COPD: No longer smoking.  Not using home oxygen at this time.  3. CAD s/p CABG: Cath 06/10/16 with severe 3 vessel native disease with patent bypass grafts. No interventional target.  - Continue statin.  With stable CAD and Xarelto use, does not need to be on ASA.  4. Atrial fibrillation: Paroxysmal.  He is in NSR today.  - Continue Xarelto. CBC today.  5. Syncope: Recent syncope felt most likely to be vasovagal. As above, repeating echo in 9/18 and will get CRT-D if EF remains low.   Followup with pharmacist clinic in 2 wks to make sure he tolerates Entresto.  He will then see me with an echo in 1 month.   Loralie Champagne, MD 08/24/16

## 2016-08-24 NOTE — Patient Instructions (Signed)
Stop Losartan   Start Entresto 24/26 (1tab) Twice daily   Labs today   Your physician recommends that you schedule a follow-up appointment in: In 2 weeks with Abbe [place] D and labs   Your physician recommends that you schedule a follow-up appointment in: in 1 month with echocardiogram

## 2016-08-25 DIAGNOSIS — I2510 Atherosclerotic heart disease of native coronary artery without angina pectoris: Secondary | ICD-10-CM | POA: Diagnosis not present

## 2016-08-25 DIAGNOSIS — Z7952 Long term (current) use of systemic steroids: Secondary | ICD-10-CM | POA: Diagnosis not present

## 2016-08-25 DIAGNOSIS — Z7982 Long term (current) use of aspirin: Secondary | ICD-10-CM | POA: Diagnosis not present

## 2016-08-25 DIAGNOSIS — N181 Chronic kidney disease, stage 1: Secondary | ICD-10-CM | POA: Diagnosis not present

## 2016-08-25 DIAGNOSIS — I5041 Acute combined systolic (congestive) and diastolic (congestive) heart failure: Secondary | ICD-10-CM | POA: Diagnosis not present

## 2016-08-25 DIAGNOSIS — M1991 Primary osteoarthritis, unspecified site: Secondary | ICD-10-CM | POA: Diagnosis not present

## 2016-08-25 DIAGNOSIS — J449 Chronic obstructive pulmonary disease, unspecified: Secondary | ICD-10-CM | POA: Diagnosis not present

## 2016-08-25 DIAGNOSIS — I4891 Unspecified atrial fibrillation: Secondary | ICD-10-CM | POA: Diagnosis not present

## 2016-08-25 DIAGNOSIS — I130 Hypertensive heart and chronic kidney disease with heart failure and stage 1 through stage 4 chronic kidney disease, or unspecified chronic kidney disease: Secondary | ICD-10-CM | POA: Diagnosis not present

## 2016-08-25 DIAGNOSIS — Z951 Presence of aortocoronary bypass graft: Secondary | ICD-10-CM | POA: Diagnosis not present

## 2016-08-25 DIAGNOSIS — Z7901 Long term (current) use of anticoagulants: Secondary | ICD-10-CM | POA: Diagnosis not present

## 2016-08-25 DIAGNOSIS — E1122 Type 2 diabetes mellitus with diabetic chronic kidney disease: Secondary | ICD-10-CM | POA: Diagnosis not present

## 2016-08-25 DIAGNOSIS — Z794 Long term (current) use of insulin: Secondary | ICD-10-CM | POA: Diagnosis not present

## 2016-08-27 ENCOUNTER — Emergency Department (HOSPITAL_COMMUNITY): Payer: Medicare Other

## 2016-08-27 ENCOUNTER — Telehealth (HOSPITAL_COMMUNITY): Payer: Self-pay | Admitting: Cardiology

## 2016-08-27 ENCOUNTER — Encounter (HOSPITAL_COMMUNITY): Payer: Self-pay

## 2016-08-27 ENCOUNTER — Observation Stay (HOSPITAL_COMMUNITY)
Admission: EM | Admit: 2016-08-27 | Discharge: 2016-08-28 | Disposition: A | Discharge status: Home / Self Care | Payer: Medicare Other | Attending: Internal Medicine | Admitting: Internal Medicine

## 2016-08-27 ENCOUNTER — Encounter: Payer: Self-pay | Admitting: Family Medicine

## 2016-08-27 DIAGNOSIS — T887XXA Unspecified adverse effect of drug or medicament, initial encounter: Secondary | ICD-10-CM

## 2016-08-27 DIAGNOSIS — I2510 Atherosclerotic heart disease of native coronary artery without angina pectoris: Secondary | ICD-10-CM | POA: Insufficient documentation

## 2016-08-27 DIAGNOSIS — Z803 Family history of malignant neoplasm of breast: Secondary | ICD-10-CM | POA: Insufficient documentation

## 2016-08-27 DIAGNOSIS — E1165 Type 2 diabetes mellitus with hyperglycemia: Secondary | ICD-10-CM | POA: Insufficient documentation

## 2016-08-27 DIAGNOSIS — H3530 Unspecified macular degeneration: Secondary | ICD-10-CM | POA: Insufficient documentation

## 2016-08-27 DIAGNOSIS — Z86010 Personal history of colonic polyps: Secondary | ICD-10-CM | POA: Insufficient documentation

## 2016-08-27 DIAGNOSIS — Z9049 Acquired absence of other specified parts of digestive tract: Secondary | ICD-10-CM | POA: Insufficient documentation

## 2016-08-27 DIAGNOSIS — Z7951 Long term (current) use of inhaled steroids: Secondary | ICD-10-CM | POA: Diagnosis not present

## 2016-08-27 DIAGNOSIS — R55 Syncope and collapse: Secondary | ICD-10-CM | POA: Insufficient documentation

## 2016-08-27 DIAGNOSIS — Z9841 Cataract extraction status, right eye: Secondary | ICD-10-CM | POA: Insufficient documentation

## 2016-08-27 DIAGNOSIS — I5042 Chronic combined systolic (congestive) and diastolic (congestive) heart failure: Secondary | ICD-10-CM | POA: Insufficient documentation

## 2016-08-27 DIAGNOSIS — T464X5A Adverse effect of angiotensin-converting-enzyme inhibitors, initial encounter: Secondary | ICD-10-CM | POA: Diagnosis not present

## 2016-08-27 DIAGNOSIS — Z9981 Dependence on supplemental oxygen: Secondary | ICD-10-CM | POA: Insufficient documentation

## 2016-08-27 DIAGNOSIS — Z79899 Other long term (current) drug therapy: Secondary | ICD-10-CM | POA: Diagnosis not present

## 2016-08-27 DIAGNOSIS — Z87891 Personal history of nicotine dependence: Secondary | ICD-10-CM | POA: Insufficient documentation

## 2016-08-27 DIAGNOSIS — I130 Hypertensive heart and chronic kidney disease with heart failure and stage 1 through stage 4 chronic kidney disease, or unspecified chronic kidney disease: Secondary | ICD-10-CM | POA: Diagnosis not present

## 2016-08-27 DIAGNOSIS — Z6832 Body mass index (BMI) 32.0-32.9, adult: Secondary | ICD-10-CM | POA: Insufficient documentation

## 2016-08-27 DIAGNOSIS — I5022 Chronic systolic (congestive) heart failure: Secondary | ICD-10-CM | POA: Diagnosis present

## 2016-08-27 DIAGNOSIS — Z7901 Long term (current) use of anticoagulants: Secondary | ICD-10-CM | POA: Insufficient documentation

## 2016-08-27 DIAGNOSIS — I959 Hypotension, unspecified: Secondary | ICD-10-CM | POA: Diagnosis not present

## 2016-08-27 DIAGNOSIS — E1121 Type 2 diabetes mellitus with diabetic nephropathy: Secondary | ICD-10-CM | POA: Insufficient documentation

## 2016-08-27 DIAGNOSIS — I255 Ischemic cardiomyopathy: Secondary | ICD-10-CM | POA: Insufficient documentation

## 2016-08-27 DIAGNOSIS — J449 Chronic obstructive pulmonary disease, unspecified: Secondary | ICD-10-CM | POA: Insufficient documentation

## 2016-08-27 DIAGNOSIS — Z888 Allergy status to other drugs, medicaments and biological substances status: Secondary | ICD-10-CM | POA: Insufficient documentation

## 2016-08-27 DIAGNOSIS — F329 Major depressive disorder, single episode, unspecified: Secondary | ICD-10-CM | POA: Insufficient documentation

## 2016-08-27 DIAGNOSIS — N183 Chronic kidney disease, stage 3 unspecified: Secondary | ICD-10-CM | POA: Diagnosis present

## 2016-08-27 DIAGNOSIS — I480 Paroxysmal atrial fibrillation: Secondary | ICD-10-CM | POA: Insufficient documentation

## 2016-08-27 DIAGNOSIS — E1122 Type 2 diabetes mellitus with diabetic chronic kidney disease: Secondary | ICD-10-CM | POA: Diagnosis not present

## 2016-08-27 DIAGNOSIS — E669 Obesity, unspecified: Secondary | ICD-10-CM | POA: Insufficient documentation

## 2016-08-27 DIAGNOSIS — Z794 Long term (current) use of insulin: Secondary | ICD-10-CM | POA: Diagnosis not present

## 2016-08-27 DIAGNOSIS — Z833 Family history of diabetes mellitus: Secondary | ICD-10-CM | POA: Insufficient documentation

## 2016-08-27 DIAGNOSIS — Z9842 Cataract extraction status, left eye: Secondary | ICD-10-CM | POA: Insufficient documentation

## 2016-08-27 DIAGNOSIS — Z96641 Presence of right artificial hip joint: Secondary | ICD-10-CM | POA: Insufficient documentation

## 2016-08-27 DIAGNOSIS — E785 Hyperlipidemia, unspecified: Secondary | ICD-10-CM | POA: Insufficient documentation

## 2016-08-27 DIAGNOSIS — Z9889 Other specified postprocedural states: Secondary | ICD-10-CM | POA: Insufficient documentation

## 2016-08-27 DIAGNOSIS — E872 Acidosis, unspecified: Secondary | ICD-10-CM

## 2016-08-27 DIAGNOSIS — I5043 Acute on chronic combined systolic (congestive) and diastolic (congestive) heart failure: Secondary | ICD-10-CM

## 2016-08-27 DIAGNOSIS — I10 Essential (primary) hypertension: Secondary | ICD-10-CM | POA: Diagnosis present

## 2016-08-27 DIAGNOSIS — I447 Left bundle-branch block, unspecified: Secondary | ICD-10-CM | POA: Diagnosis not present

## 2016-08-27 DIAGNOSIS — Z951 Presence of aortocoronary bypass graft: Secondary | ICD-10-CM | POA: Insufficient documentation

## 2016-08-27 DIAGNOSIS — Z823 Family history of stroke: Secondary | ICD-10-CM | POA: Insufficient documentation

## 2016-08-27 DIAGNOSIS — R531 Weakness: Secondary | ICD-10-CM | POA: Diagnosis not present

## 2016-08-27 DIAGNOSIS — Z885 Allergy status to narcotic agent status: Secondary | ICD-10-CM | POA: Insufficient documentation

## 2016-08-27 DIAGNOSIS — Z8249 Family history of ischemic heart disease and other diseases of the circulatory system: Secondary | ICD-10-CM | POA: Insufficient documentation

## 2016-08-27 DIAGNOSIS — I952 Hypotension due to drugs: Principal | ICD-10-CM | POA: Diagnosis present

## 2016-08-27 DIAGNOSIS — Z7982 Long term (current) use of aspirin: Secondary | ICD-10-CM | POA: Diagnosis not present

## 2016-08-27 DIAGNOSIS — E119 Type 2 diabetes mellitus without complications: Secondary | ICD-10-CM

## 2016-08-27 DIAGNOSIS — Z961 Presence of intraocular lens: Secondary | ICD-10-CM | POA: Insufficient documentation

## 2016-08-27 LAB — COMPREHENSIVE METABOLIC PANEL
ALBUMIN: 3.5 g/dL (ref 3.5–5.0)
ALT: 22 U/L (ref 17–63)
ANION GAP: 12 (ref 5–15)
AST: 24 U/L (ref 15–41)
Alkaline Phosphatase: 81 U/L (ref 38–126)
BILIRUBIN TOTAL: 0.5 mg/dL (ref 0.3–1.2)
BUN: 30 mg/dL — ABNORMAL HIGH (ref 6–20)
CO2: 25 mmol/L (ref 22–32)
Calcium: 9.8 mg/dL (ref 8.9–10.3)
Chloride: 102 mmol/L (ref 101–111)
Creatinine, Ser: 1.42 mg/dL — ABNORMAL HIGH (ref 0.61–1.24)
GFR calc Af Amer: 53 mL/min — ABNORMAL LOW (ref >60)
GFR, EST NON AFRICAN AMERICAN: 46 mL/min — AB (ref >60)
Glucose, Bld: 228 mg/dL — ABNORMAL HIGH (ref 65–99)
POTASSIUM: 4.4 mmol/L (ref 3.5–5.1)
Sodium: 139 mmol/L (ref 135–145)
TOTAL PROTEIN: 6.3 g/dL — AB (ref 6.5–8.1)

## 2016-08-27 LAB — CBC WITH DIFFERENTIAL/PLATELET
BASOS ABS: 0 10*3/uL (ref 0.0–0.1)
Basophils Relative: 0 %
Eosinophils Absolute: 0.3 10*3/uL (ref 0.0–0.7)
Eosinophils Relative: 4 %
HEMATOCRIT: 44.6 % (ref 39.0–52.0)
HEMOGLOBIN: 13.9 g/dL (ref 13.0–17.0)
LYMPHS PCT: 17 %
Lymphs Abs: 1.7 10*3/uL (ref 0.7–4.0)
MCH: 25.9 pg — ABNORMAL LOW (ref 26.0–34.0)
MCHC: 31.2 g/dL (ref 30.0–36.0)
MCV: 83.1 fL (ref 78.0–100.0)
Monocytes Absolute: 1 10*3/uL (ref 0.1–1.0)
Monocytes Relative: 10 %
NEUTROS ABS: 6.8 10*3/uL (ref 1.7–7.7)
Neutrophils Relative %: 69 %
Platelets: 200 10*3/uL (ref 150–400)
RBC: 5.37 MIL/uL (ref 4.22–5.81)
RDW: 17.8 % — ABNORMAL HIGH (ref 11.5–15.5)
WBC: 9.8 10*3/uL (ref 4.0–10.5)

## 2016-08-27 LAB — URINALYSIS, ROUTINE W REFLEX MICROSCOPIC
BILIRUBIN URINE: NEGATIVE
Glucose, UA: >=500 mg/dL — AB
HGB URINE DIPSTICK: NEGATIVE
Ketones, ur: NEGATIVE mg/dL
LEUKOCYTES UA: NEGATIVE
Nitrite: NEGATIVE
PROTEIN: NEGATIVE mg/dL
SPECIFIC GRAVITY, URINE: 1.007 (ref 1.005–1.030)
SQUAMOUS EPITHELIAL / LPF: NONE SEEN
pH: 5 (ref 5.0–8.0)

## 2016-08-27 LAB — I-STAT CG4 LACTIC ACID, ED
LACTIC ACID, VENOUS: 3.48 mmol/L — AB (ref 0.5–1.9)
Lactic Acid, Venous: 1.81 mmol/L (ref 0.5–1.9)

## 2016-08-27 LAB — DIGOXIN LEVEL: Digoxin Level: 0.8 ng/mL (ref 0.8–2.0)

## 2016-08-27 MED ORDER — ACETAMINOPHEN 650 MG RE SUPP: 650.0000 mg | Freq: Four times a day (QID) | RECTAL | Status: DC | PRN

## 2016-08-27 MED ORDER — INSULIN ASPART 100 UNIT/ML ~~LOC~~ SOLN
0.0000 [IU] | Freq: Three times a day (TID) | SUBCUTANEOUS | Status: DC
  Administered 2016-08-28: 11 [IU] via SUBCUTANEOUS
  Administered 2016-08-28: 8 [IU] via SUBCUTANEOUS

## 2016-08-27 MED ORDER — ATORVASTATIN CALCIUM 40 MG PO TABS
40.0000 mg | ORAL_TABLET | Freq: Every day | ORAL | Status: DC
  Administered 2016-08-28: 40 mg via ORAL
  Filled 2016-08-27: qty 1

## 2016-08-27 MED ORDER — ASPIRIN 81 MG PO CHEW
81.0000 mg | CHEWABLE_TABLET | Freq: Every day | ORAL | Status: DC
  Administered 2016-08-28: 81 mg via ORAL
  Filled 2016-08-27: qty 1

## 2016-08-27 MED ORDER — FLUTICASONE PROPIONATE 50 MCG/ACT NA SUSP
2.0000 | Freq: Every day | NASAL | Status: DC
  Administered 2016-08-28: 2 via NASAL
  Filled 2016-08-27: qty 16

## 2016-08-27 MED ORDER — ACETAMINOPHEN 325 MG PO TABS: 650.0000 mg | ORAL_TABLET | Freq: Four times a day (QID) | ORAL | Status: DC | PRN

## 2016-08-27 MED ORDER — ISOSORBIDE MONONITRATE ER 30 MG PO TB24
30.0000 mg | ORAL_TABLET | Freq: Every day | ORAL | Status: DC
  Administered 2016-08-28: 30 mg via ORAL
  Filled 2016-08-27: qty 1

## 2016-08-27 MED ORDER — SODIUM CHLORIDE 0.9% FLUSH: 3.0000 mL | Freq: Two times a day (BID) | INTRAVENOUS | Status: DC

## 2016-08-27 MED ORDER — DIGOXIN 125 MCG PO TABS
0.1250 mg | ORAL_TABLET | Freq: Every day | ORAL | Status: DC
  Administered 2016-08-28: 0.125 mg via ORAL
  Filled 2016-08-27: qty 1

## 2016-08-27 MED ORDER — LACTATED RINGERS IV SOLN
INTRAVENOUS | Status: DC
  Administered 2016-08-28: 01:00:00 via INTRAVENOUS

## 2016-08-27 MED ORDER — GABAPENTIN 100 MG PO CAPS
200.0000 mg | ORAL_CAPSULE | Freq: Three times a day (TID) | ORAL | Status: DC
  Administered 2016-08-28 (×2): 200 mg via ORAL
  Filled 2016-08-27 (×2): qty 2

## 2016-08-27 MED ORDER — SODIUM CHLORIDE 0.9 % IV BOLUS (SEPSIS)
500.0000 mL | Freq: Once | INTRAVENOUS | Status: AC
  Administered 2016-08-27: 500 mL via INTRAVENOUS

## 2016-08-27 MED ORDER — BUPROPION HCL ER (SR) 150 MG PO TB12
150.0000 mg | ORAL_TABLET | Freq: Two times a day (BID) | ORAL | Status: DC
  Administered 2016-08-28 (×2): 150 mg via ORAL
  Filled 2016-08-27 (×2): qty 1

## 2016-08-27 MED ORDER — SPIRONOLACTONE 25 MG PO TABS
25.0000 mg | ORAL_TABLET | Freq: Every day | ORAL | Status: DC
  Administered 2016-08-28: 25 mg via ORAL
  Filled 2016-08-27: qty 1

## 2016-08-27 MED ORDER — BISOPROLOL FUMARATE 5 MG PO TABS
5.0000 mg | ORAL_TABLET | Freq: Every day | ORAL | Status: DC
  Administered 2016-08-28: 5 mg via ORAL
  Filled 2016-08-27: qty 1

## 2016-08-27 MED ORDER — TORSEMIDE 20 MG PO TABS
40.0000 mg | ORAL_TABLET | Freq: Every day | ORAL | Status: DC
  Administered 2016-08-28: 40 mg via ORAL
  Filled 2016-08-27: qty 2

## 2016-08-27 MED ORDER — ONDANSETRON HCL 4 MG PO TABS: 4.0000 mg | ORAL_TABLET | Freq: Four times a day (QID) | ORAL | Status: DC | PRN

## 2016-08-27 MED ORDER — INSULIN GLARGINE 100 UNIT/ML ~~LOC~~ SOLN
70.0000 [IU] | Freq: Every day | SUBCUTANEOUS | Status: DC
  Administered 2016-08-28: 70 [IU] via SUBCUTANEOUS
  Filled 2016-08-27 (×2): qty 0.7

## 2016-08-27 MED ORDER — INSULIN LISPRO 100 UNIT/ML (KWIKPEN): 18.0000 [IU] | PEN_INJECTOR | SUBCUTANEOUS | Status: DC

## 2016-08-27 MED ORDER — TAMSULOSIN HCL 0.4 MG PO CAPS
0.4000 mg | ORAL_CAPSULE | Freq: Every day | ORAL | Status: DC
  Administered 2016-08-28: 0.4 mg via ORAL
  Filled 2016-08-27: qty 1

## 2016-08-27 MED ORDER — INSULIN ASPART 100 UNIT/ML ~~LOC~~ SOLN
0.0000 [IU] | Freq: Every day | SUBCUTANEOUS | Status: DC
  Administered 2016-08-28: 3 [IU] via SUBCUTANEOUS
  Filled 2016-08-27: qty 1

## 2016-08-27 MED ORDER — MOMETASONE FURO-FORMOTEROL FUM 200-5 MCG/ACT IN AERO
2.0000 | INHALATION_SPRAY | Freq: Two times a day (BID) | RESPIRATORY_TRACT | Status: DC
  Administered 2016-08-28: 2 via RESPIRATORY_TRACT
  Filled 2016-08-27: qty 8.8

## 2016-08-27 MED ORDER — RIVAROXABAN 20 MG PO TABS
20.0000 mg | ORAL_TABLET | Freq: Every day | ORAL | Status: DC
  Administered 2016-08-28: 20 mg via ORAL
  Filled 2016-08-27: qty 1

## 2016-08-27 MED ORDER — CITALOPRAM HYDROBROMIDE 20 MG PO TABS
40.0000 mg | ORAL_TABLET | Freq: Every day | ORAL | Status: DC
  Administered 2016-08-28: 40 mg via ORAL
  Filled 2016-08-27: qty 2

## 2016-08-27 MED ORDER — ONDANSETRON HCL 4 MG/2ML IJ SOLN: 4.0000 mg | Freq: Four times a day (QID) | INTRAMUSCULAR | Status: DC | PRN

## 2016-08-27 MED ORDER — GUAIFENESIN ER 600 MG PO TB12
1200.0000 mg | ORAL_TABLET | Freq: Two times a day (BID) | ORAL | Status: DC
  Administered 2016-08-28 (×2): 1200 mg via ORAL
  Filled 2016-08-27 (×2): qty 2

## 2016-08-27 NOTE — ED Triage Notes (Signed)
Pt endorses checking blood pressure this morning and it was 93/55, then it dropped to 71/38. Pt normally has HTN. Pt took HTN meds this morning. Pt hypotensive at 84/32 in triage. Pt endorses weakness and fatigue that began this morning.

## 2016-08-27 NOTE — H&P (Addendum)
History and Physical    Joshua [name] WCH:[id number] DOB: 17-Nov-1940 DOA: 08/27/2016  PCP: Tammi Sou, MD Consultants:  [name] - cardiology; Dwyane Dee - endocrinology; [facility] - pulmonology Patient coming from:  Home - lives with wife and wife's sister; NOK: wife, [phone]  Chief Complaint: near syncope  HPI: Joshua [name] is a 76 y.o. male with medical history significant of PAF on Xarelto; PAD; HTN; HLD: DM; COPD; CKD stage 3; CAD; and chronic diastolic heart failure presenting with near syncope.  He reports that he "got real weak".  His BP was low.  "There at one time I thought I was gonna pass out".  He felt fairly well this AM upon awakening but started feeling bad about 9-10AM.  He got up to get himself a drink and he got weak upon standing.  BP at that time was 77/48; usually runs 123/53.  Patient started [facility] yesterday and symptoms happened today.  He wasn't having any problems up until that point so thaey are fairly sure it was the [place].  He still feels weak and tired.     ED Course: 84/32 on arrival.  Lactate 3.48.  Cardiology consult - likely reaction to [facility].  Review of Systems: As per HPI; otherwise review of systems reviewed and negative.   Ambulatory Status:  Ambulates with a walker today but for the last month or so he has used a cane  Past Medical History:  Diagnosis Date  . Acute respiratory distress 06/04/2016  . Adenomatous colon polyp 10/16/2011   Repeat 2018  . Arthritis     Hips, R>L & KNEES  . CAD, multiple vessel    3V CAD cath 08/08/13----CABG done shortly after.  . 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 pylori neg, no dysplasia or malignancy.  . Chronic diastolic heart failure ([place]) 2016  . Chronic renal insufficiency, stage 3 (moderate) 2018   GFR 50s  . COPD (chronic obstructive pulmonary disease) ([place])    GOLD II.  Spirometry  2004 borderline obstruction; 2015 mod obst: noncompliant with bid symbicort  so pulmonologist switched him to a once daily inhaler: Trelegy ellipta 12/2015.  Oxygen prn: goal 88-92%.  . Diabetic nephropathy ([place])    Elevated urine microalb/cr 03/2011  . DM type 2 (diabetes mellitus, type 2) ([place])    Poor control on max oral meds--pt eventually agreed to insulin therapy.  As of 2017 his DM is being managed by Dr. Dwyane Dee in endo.  . DOE (dyspnea on exertion)    COPD + chronic diastolic HF  . Erectile dysfunction    Normal testosterone  . Hearing loss of both ears 2016   Hearing aids  . Hyperlipidemia   . Hyperplastic colon polyp 2001  . Hypertension   . Iron deficiency anemia 2014   03/2012 capsule endoscopy showed 2 AVMs--likely responsible for his IDA--lifetime iron supp recommended + q60moCBCs.  . Macular degeneration, dry    Mild, bilat (Optometrist, [place]ife[place] [employer] [place]  . Obesity   . Open toe wound 11/2015   Wound care clinic appt made and then canceled when toe improved.  . Other and unspecified angina pectoris   . PAD (peripheral artery disease) ([place] 02/2016   Abnormal ABI's and waveforms: LE arterial duplex ordered for f/u as per cardiologist's recommendation.  Vasc eval by Dr. CBridgett Larsson impression was minimal PAD, recommended maximize med mgmt.  .Marland KitchenPAF (paroxysmal atrial fibrillation) ([place] 10/2014   xarelto  .  Pericardial effusion with cardiac tamponade 6//29/15   pericardiocentesis was done,  Infectious/inflamm (cytology showed NO MALIGNANT CELLS)  . Pleural plaque    Pleural plaques/asbestosis changes on CT chest done by pulm 05/2016.  [name] Pneumonia   . Tobacco dependence in remission    100+ pack-yr hx: quit after CABG    Past Surgical History:  Procedure Laterality Date  . APPENDECTOMY  1957  . CARDIAC CATHETERIZATION  08/08/2013  . CATARACT EXTRACTION W/ INTRAOCULAR LENS  IMPLANT, BILATERAL  04/08/2006 & 04/22/2006  . CATARACT EXTRACTION W/ INTRAOCULAR LENS  IMPLANT, BILATERAL Bilateral   . CHOLECYSTECTOMY OPEN  1999   . COLONOSCOPY  10/16/2011   Procedure: COLONOSCOPY;  Surgeon: Inda Castle, MD;  Location: [facility];  Service: Endoscopy;  Laterality: N/A;  . CORONARY ARTERY BYPASS GRAFT N/A 08/14/2013   Procedure: CORONARY ARTERY BYPASS GRAFTING (CABG);  Surgeon: Melrose Nakayama, MD;  Location: [facility];  Service: Open Heart Surgery;  Laterality: N/A;  Times 4   using left internal mammary artery and endoscopically harvested bilateral saphenous vein  . ESOPHAGOGASTRODUODENOSCOPY  02/25/12   Atrophic gastritis with a few erosions--capsule endoscopy planned as of 02/25/12 (Dr. Deatra Ina).  [name] HARDWARE REMOVAL Right 08/12/2012   Procedure: HARDWARE REMOVAL;  Surgeon: Mcarthur Rossetti, MD;  Location: [facility];  Service: Orthopedics;  Laterality: Right;  . HIP SURGERY Right 1954   Repair of slipped capital femoral epiphysis.  . INTRAOPERATIVE TRANSESOPHAGEAL ECHOCARDIOGRAM N/A 08/14/2013   Normal LV function. Procedure: INTRAOPERATIVE TRANSESOPHAGEAL ECHOCARDIOGRAM;  Surgeon: Melrose Nakayama, MD;  Location: [facility];  Service: Open Heart Surgery;  Laterality: N/A;  . LEFT HEART CATHETERIZATION WITH CORONARY ANGIOGRAM N/A 08/08/2013   Procedure: LEFT HEART CATHETERIZATION WITH CORONARY ANGIOGRAM;  Surgeon: Jettie Booze, MD;  Location: [facility] CATH LAB;  Service: Cardiovascular;  Laterality: N/A;  . PATELLA FRACTURE SURGERY Left ~ 1979   bolt + 3 screws to repair tib plateau fx  . PERICARDIAL TAP N/A 07/01/2013   Procedure: PERICARDIAL TAP;  Surgeon: Jettie Booze, MD;  Location: [facility] CATH LAB;  Service: Cardiovascular;  Laterality: N/A;  . RIGHT/LEFT HEART CATH AND CORONARY ANGIOGRAPHY N/A 06/10/2016   Procedure: Right/Left Heart Cath and Coronary Angiography;  Surgeon: Larey Dresser, MD;  Location: [facility] CV LAB;  Service: Cardiovascular;  Laterality: N/A;  . TESTICLE SURGERY  as a child   Undescended testicle brought down into scrotum  . TONSILLECTOMY  1947  . TOTAL HIP ARTHROPLASTY Right  08/12/2012   Procedure: REMOVAL OF OLD PINS RIGHT HIP AND RIGHT TOTAL HIP ARTHROPLASTY ANTERIOR APPROACH;  Surgeon: Mcarthur Rossetti, MD;  Location: [facility];  Service: Orthopedics;  Laterality: Right;  . TRANSTHORACIC ECHOCARDIOGRAM  10/30/14   Mod LVH, EF 60-65%, normal wall motion, mod mitral regurg, mild PAH    Social History   Social History  . Marital status: Married    Spouse name: [name]  . Number of children: 4  . Years of education: N/A   Occupational History  . retired, former Scientist, forensic    Social History Main Topics  . Smoking status: Former Smoker    Packs/day: 0.25    Years: 62.00    Types: Cigarettes    Quit date: 10/23/2015  . Smokeless tobacco: Former Systems developer    Quit date: 06/30/2013  . Alcohol use No  . Drug use: No  . Sexual activity: No   Other Topics Concern  . Not on file   Social History Narrative   Married,  4 children.   Formerly a Medical illustrator.   Level of education: HS.     +tobacco--lifelong/ quit 2015).  No alcohol or drugs.   No exercise.  +excessive caffeine.    Allergies  Allergen Reactions  . Morphine And Related Other (See Comments)    Drenched with perspiration  . Entresto [Sacubitril-Valsartan]     hypotension  . Demerol [Meperidine] Nausea Only  . Starlix [Nateglinide] Other (See Comments)    gassy    Family History  Problem Relation Age of Onset  . Heart disease Father   . Heart attack Father   . CVA Mother   . Hypertension Mother   . Diabetes Paternal Grandmother   . Breast cancer Sister   . Diabetes Maternal Uncle   . Heart disease Maternal Uncle   . Stroke Neg Hx     Prior to Admission medications   Medication Sig Start Date End Date Taking? Authorizing Provider  acetaminophen (TYLENOL) 325 MG tablet Take 2 tablets (650 mg total) by mouth every 4 (four) hours as needed for headache or mild pain. 08/09/13  Yes Erlene Quan, PA-C  aspirin 81 MG tablet Take 81 mg by mouth at bedtime. Reported on  02/26/2015   Yes [provider]  ACCU-CHEK GUIDE test strip USE TO CHECK BLOOD SUGAR 3 TIMES DAILY 07/23/16   McGowen, Adrian Blackwater, MD  ACCU-CHEK GUIDE test strip USE TO CHECK BLOOD SUGAR 3 TIMES DAILY 07/27/16   McGowen, Adrian Blackwater, MD  Ascorbic Acid (VITAMIN C) 1000 MG tablet Take 1,000 mg by mouth daily.    [provider]  atorvastatin (LIPITOR) 40 MG tablet TAKE ONE TABLET BY MOUTH EVERY DAY 02/17/16   McGowen, Adrian Blackwater, MD  bisoprolol (ZEBETA) 5 MG tablet Take 1 tablet (5 mg total) by mouth daily. 06/12/16   Shirley Friar, PA-C  budesonide-formoterol (SYMBICORT) 160-4.5 MCG/ACT inhaler Inhale 2 puffs into the lungs 2 (two) times daily. 05/17/15   McGowen, Adrian Blackwater, MD  buPROPion (WELLBUTRIN SR) 150 MG 12 hr tablet TAKE ONE TABLET BY MOUTH TWICE DAILY 08/18/16   McGowen, Adrian Blackwater, MD  Cholecalciferol (VITAMIN D-3) 1000 units CAPS Take 1,000 Units by mouth daily. Reported on 02/26/2015    [provider]  citalopram (CELEXA) 40 MG tablet Take 1 tablet (40 mg total) by mouth daily. 08/17/16   McGowen, Adrian Blackwater, MD  digoxin (LANOXIN) 0.125 MG tablet Take 1 tablet (0.125 mg total) by mouth daily. 06/12/16   Shirley Friar, PA-C  fluticasone (FLONASE) 50 MCG/ACT nasal spray Place 2 sprays into both nostrils daily. 06/04/16   McGowen, Adrian Blackwater, MD  gabapentin (NEURONTIN) 100 MG capsule Take two capsules three times daily 06/29/16   McGowen, Adrian Blackwater, MD  guaiFENesin (MUCINEX) 600 MG 12 hr tablet Take 2 tablets (1,200 mg total) by mouth 2 (two) times daily. 11/02/14   Verlee Monte, MD  hyoscyamine (LEVSIN SL) 0.125 MG SL tablet 1-2 tabs po q6h prn diarrhea 08/10/16   McGowen, Adrian Blackwater, MD  Insulin Glargine (TOUJEO SOLOSTAR) 300 UNIT/ML SOPN Inject 70 Units into the skin daily. Patient taking differently: Inject 80 Units into the skin daily.  04/20/16   Elayne Snare, MD  insulin lispro (HUMALOG KWIKPEN) 100 UNIT/ML KiwkPen Inject 18 units at breakfast 20 units at lunch and 26  units at dinner Patient taking differently: Inject 18 units at breakfast 20 units at lunch and 28 units at dinner 07/17/16   Elayne Snare, MD  isosorbide mononitrate (IMDUR)  30 MG 24 hr tablet TAKE ONE TABLET BY MOUTH DAILY 02/17/16   McGowen, Adrian Blackwater, MD  Multiple Vitamin (MULTIVITAMIN WITH MINERALS) TABS Take 1 tablet by mouth daily.    [provider]  nitroGLYCERIN (NITROSTAT) 0.4 MG SL tablet Place 1 tablet (0.4 mg total) under the tongue every 5 (five) minutes as needed for chest pain. Patient not taking: Reported on 08/24/2016 01/31/16   Jettie Booze, MD  potassium chloride SA (K-DUR,KLOR-CON) 20 MEQ tablet Take 1 tablet (20 mEq total) by mouth daily. 06/11/16   Shirley Friar, PA-C  PROAIR HFA 108 (90 Base) MCG/ACT inhaler INHALE 2 PUFFS INTO THE LUNGS EVERY 4 HOURS AS NEEDED FOR WHEEZING ORSHORTNESS OF BREATH 06/05/16   McGowen, Adrian Blackwater, MD  rivaroxaban (XARELTO) 20 MG TABS tablet Take 1 tablet (20 mg total) by mouth daily with supper. 07/14/16   Larey Dresser, MD  sacubitril-valsartan (ENTRESTO) 24-26 MG Take 1 tablet by mouth 2 (two) times daily. 08/24/16   Larey Dresser, MD  spironolactone (ALDACTONE) 25 MG tablet Take 1 tablet (25 mg total) by mouth daily. 06/12/16   Shirley Friar, PA-C  SYNJARDY 05-998 MG TABS TAKE ONE TABLET BY MOUTH TWICE DAILY AFTER A MEAL 07/06/16   Elayne Snare, MD  tamsulosin (FLOMAX) 0.4 MG CAPS capsule TAKE 1 CAPSULE BY MOUTH EVERY DAY AFTER SUPPER 05/06/16   McGowen, Adrian Blackwater, MD  torsemide (DEMADEX) 20 MG tablet Take 2 tablets (40 mg total) by mouth daily. 06/12/16   Shirley Friar, PA-C    Physical Exam: Vitals:   08/28/16 0030 08/28/16 0034 08/28/16 0049 08/28/16 0100  BP: (!) 129/48  121/76 (!) 132/58  Pulse: (!) 59 (!) 59 (!) 59 60  Resp: _0 Temp:      TempSrc:      SpO2:  91% 94% 92%  Weight:      Height:         General: Appears calm and comfortable and is NAD but does appear fatigued Eyes:   PERRL, EOMI, normal lids, iris ENT:  grossly normal hearing, lips & tongue, mmm; appropriate dentition Neck:  no LAD, masses or thyromegaly; no carotid bruits Cardiovascular:  RRR, no m/r/g. No LE edema.  Respiratory:   CTA bilaterally with no wheezes/rales/rhonchi.  Normal respiratory effort. Abdomen:  soft, NT, ND, NABS Back:   normal alignment, no CVAT Skin:  no rash or induration seen on limited exam Musculoskeletal:  grossly normal tone BUE/BLE, good ROM, no bony abnormality Lower extremity:  No LE edema.  Limited foot exam with no ulcerations.  2+ distal pulses. Psychiatric:  grossly normal mood and affect, speech fluent and appropriate, AOx3 Neurologic:  CN 2-12 grossly intact, moves all extremities in coordinated fashion, sensation intact    Radiological Exams on Admission: Dg Chest 2 View  Result Date: 08/27/2016 CLINICAL DATA:  Hypotension. EXAM: CHEST  2 VIEW COMPARISON:  06/16/2016 FINDINGS: Subtle nodularity right upper lobe is similar to prior. No evidence for focal airspace consolidation, airspace pulmonary edema, or pleural effusion. Lungs are hyperexpanded. Patient is status post CABG. The visualized bony structures of the thorax are intact. Telemetry leads overlie the chest. IMPRESSION: Stable exam. Subtle nodularity right upper lung, better evaluated on previous CT scan of 06/02/2016. No pulmonary edema or pleural effusion. Electronically Signed   By: Misty Stanley M.D.   On: 08/27/2016 18:02    EKG: Independently reviewed.  NSR with rate 60; LBBB with no evidence of acute  ischemia   Labs on Admission: I have personally reviewed the available labs and imaging studies at the time of the admission.  Pertinent labs:   Glucose 296 BUN 31/Creatinine 1.35/GFR 49 - stable TSH 1.612 Digoxin 0.8 Lactate 3.48, repeat 1.81 UA: >500 glucose, rare bacteria   Assessment/Plan Principal Problem:   Weakness generalized Active Problems:   HTN (hypertension), benign   CKD  (chronic kidney disease) stage 3, GFR 30-59 ml/min   PAF (paroxysmal atrial fibrillation) (HCC)   Diabetes (HCC)   Chronic systolic CHF (congestive heart failure) (HCC)   Hypotension due to medication   Near syncope   Weakness, near syncope -patient with significant hypotension which is likely related to initiation of Entresto yesterday AM -Hypotension including severe hypotension is a known adverse reaction from this medication -Lactic acidosis is thought to be related to hypoperfusion in the setting of hypotension; will trend -Delene Loll has been added to his allergy list at this time -The half-life of the medication is 11.5 hours and so the medication should be effectively out of his system by tomorrow -If the patient is improved, it may be appropriate to discharge him at that time -If not, further evaluation may need to be considered  HTN -BP is improved -Resume bisoprolol  CHF/CAD/PAF  -Continue Aldactone, Imdur, Lipitor, Digoxin (therapeutic level), Torsemide, and Xarelto  CKD -Appears to be stable at this time  DM -Suboptimal control -Continue Toujeo and qAC Lispro -Hold Synjardy -Cover with moderate-scale SSI   DVT prophylaxis: Xarelto Code Status:  Full - confirmed with patient/family Family Communication: Wife and daughter present during encounter  Disposition Plan:  Home once clinically improved Consults called: Cardiology  Admission status: It is my clinical opinion that referral for OBSERVATION is reasonable and necessary in this patient based on the above information provided. The aforementioned taken together are felt to place the patient at high risk for further clinical deterioration. However it is anticipated that the patient may be medically stable for discharge from the hospital within 24 to 48 hours.    Karmen Bongo MD [facility]  If note is complete, please contact covering daytime or nighttime physician. [email or web address] Password  [email or web address]  08/28/2016, 2:14 AM

## 2016-08-27 NOTE — ED Provider Notes (Signed)
[facility] DEPT Provider Note   CSN: [id number] Arrival date & time: 08/27/16  1545     History   Chief Complaint Chief Complaint  Patient presents with  . Hypotension    HPI RONELL DUFFUS is a 76 y.o. male with Hx of CAD (hx of CABGx4), chronic diastolic HF, CKD, D type 2, HTN, PAD, PAF on xarelto who presents today with chief complaints acute onset, progressively improving hypotension. He states that 3 days ago his cardiologist Dr. [name] started him on Entresto. He is to take the medication twice a day and began taking it 2 days ago. He notes that his blood pressure this morning was 93/55 and then dropped to 71/38. He notes associated lightheadedness and generalized fatigue.   denies chest pain, SOB, nausea, vomiting, abdominal pain, headache. His cardiologist's office was informed and their recommendation was either to discontinue the medication or present to the ED for further follow-up. He denies any pain or symptoms at this time.  The history is provided by the patient.    Past Medical History:  Diagnosis Date  . Acute respiratory distress 06/04/2016  . Adenomatous colon polyp 10/16/2011   Repeat 2018  . Arthritis     Hips, R>L & KNEES  . CAD, multiple vessel    3V CAD cath 08/08/13----CABG done shortly after.  . 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 pylori neg, no dysplasia or malignancy.  . Chronic diastolic heart failure ([place]) 2016  . Chronic renal insufficiency, stage 3 (moderate) 2018   GFR 50s  . COPD (chronic obstructive pulmonary disease) ([place])    GOLD II.  Spirometry  2004 borderline obstruction; 2015 mod obst: noncompliant with bid symbicort so pulmonologist switched him to a once daily inhaler: Trelegy ellipta 12/2015.  Oxygen prn: goal 88-92%.  . Diabetic nephropathy ([place])    Elevated urine microalb/cr 03/2011  . DM type 2 (diabetes mellitus, type 2) ([place])    Poor control on max oral meds--pt eventually agreed to  insulin therapy.  As of 2017 his DM is being managed by Dr. Dwyane Dee in endo.  . DOE (dyspnea on exertion)    COPD + chronic diastolic HF  . Erectile dysfunction    Normal testosterone  . Hearing loss of both ears 2016   Hearing aids  . Hyperlipidemia   . Hyperplastic colon polyp 2001  . Hypertension   . Iron deficiency anemia 2014   03/2012 capsule endoscopy showed 2 AVMs--likely responsible for his IDA--lifetime iron supp recommended + q25moCBCs.  . Macular degeneration, dry    Mild, bilat (Optometrist, [place]ford [place]ife[place]  . Obesity   . Open toe wound 11/2015   Wound care clinic appt made and then canceled when toe improved.  . Other and unspecified angina pectoris   . PAD (peripheral artery disease) ([place] 02/2016   Abnormal ABI's and waveforms: LE arterial duplex ordered for f/u as per cardiologist's recommendation.  Vasc eval by Dr. CBridgett Larsson impression was minimal PAD, recommended maximize med mgmt.  .Marland KitchenPAF (paroxysmal atrial fibrillation) ([place] 10/2014   xarelto  . Pericardial effusion with cardiac tamponade 6//29/15   pericardiocentesis was done,  Infectious/inflamm (cytology showed NO MALIGNANT CELLS)  . Pleural plaque    Pleural plaques/asbestosis changes on CT chest done by pulm 05/2016.  .Marland KitchenPneumonia   . Tobacco dependence in remission    100+ pack-yr hx: quit after CABG    Patient Active Problem List  Diagnosis Date Noted  . Frequent falls 06/17/2016  . Weakness generalized 06/17/2016  . Chronic systolic CHF (congestive heart failure) ([place]) 06/17/2016  . AKI (acute kidney injury) ([place])   . Respiratory distress 06/04/2016  . Acute respiratory distress 06/04/2016  . Bronchopneumonia 06/04/2016  . Tachycardia 06/04/2016  . Hypoxia   . Chronic diastolic CHF (congestive heart failure) ([place]) 02/18/2015  . Acute upper respiratory infection 02/06/2015  . Facial cellulitis 11/28/2014  . Abscess of nasal cavity 11/28/2014  . Cellulitis and  abscess of neck 11/28/2014  . Acute on chronic diastolic CHF (congestive heart failure) ([place]) 11/12/2014  . Dyspnea 10/29/2014  . Cough 10/29/2014  . History of asbestosis 10/29/2014  . Low oxygen saturation 10/29/2014  . Chronic respiratory failure with hypoxia ([place]) 10/29/2014  . COPD exacerbation ([place]) 10/29/2014  . Acute on chronic respiratory failure with hypoxia ([place])   . Diabetes ([place]) 06/18/2014  . Depression 06/11/2014  . Diabetes mellitus with complication ([place]) 62/95/2841  . Postoperative anemia due to acute blood loss 10/12/2013  . 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 Coronary artery disease- 3 V at cath 08/08/13 08/08/2013  . DOE (dyspnea on exertion)   . Acute on chronic respiratory failure ([place]) 07/02/2013  . PAF (paroxysmal atrial fibrillation) ([place]) 07/02/2013  . Acute bronchitis 07/01/2013  . Pleural effusion with elevated pro BNP 07/01/2013  . Pericardial effusion 07/01/2013  . Pericardial effusion with cardiac tamponade- s/p perciocentesis 06/30/13   . ACS (acute coronary syndrome) ([place]) 06/30/2013  . Fatigue 06/30/2013  . Nonspecific abnormal electrocardiogram (ECG) (EKG) 06/30/2013  . COPD with exacerbation ([place]) 06/30/2013  . Abnormal chest x-ray 10/03/2012  . 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 Arthritis   . COPD (chronic obstructive pulmonary disease) ([place])   . CKD (chronic kidney disease), stage I   . Obesity   . Degenerative arthritis of hip 08/12/2012  . Preoperative clearance 07/07/2012  . Hyperlipidemia 03/16/2012  . Tobacco dependence   . Health maintenance examination 09/16/2011  . HTN (hypertension), benign 09/02/2011  . Prostate cancer screening 09/02/2011    Past Surgical History:  Procedure Laterality Date  . APPENDECTOMY  1957  . CARDIAC CATHETERIZATION  08/08/2013  . CATARACT EXTRACTION W/ INTRAOCULAR LENS  IMPLANT, BILATERAL  04/08/2006 & 04/22/2006  . CATARACT EXTRACTION W/ INTRAOCULAR LENS  IMPLANT, BILATERAL Bilateral   . CHOLECYSTECTOMY  OPEN  1999  . COLONOSCOPY  10/16/2011   Procedure: COLONOSCOPY;  Surgeon: Inda Castle, MD;  Location: [facility];  Service: Endoscopy;  Laterality: N/A;  . CORONARY ARTERY BYPASS GRAFT N/A 08/14/2013   Procedure: CORONARY ARTERY BYPASS GRAFTING (CABG);  Surgeon: Melrose Nakayama, MD;  Location: [facility];  Service: Open Heart Surgery;  Laterality: N/A;  Times 4   using left internal mammary artery and endoscopically harvested bilateral saphenous vein  . ESOPHAGOGASTRODUODENOSCOPY  02/25/12   Atrophic gastritis with a few erosions--capsule endoscopy planned as of 02/25/12 (Dr. Deatra Ina).  [name] HARDWARE REMOVAL Right 08/12/2012   Procedure: HARDWARE REMOVAL;  Surgeon: Mcarthur Rossetti, MD;  Location: [facility];  Service: Orthopedics;  Laterality: Right;  . HIP SURGERY Right 1954   Repair of slipped capital femoral epiphysis.  . INTRAOPERATIVE TRANSESOPHAGEAL ECHOCARDIOGRAM N/A 08/14/2013   Normal LV function. Procedure: INTRAOPERATIVE TRANSESOPHAGEAL ECHOCARDIOGRAM;  Surgeon: Melrose Nakayama, MD;  Location: [facility];  Service: Open Heart Surgery;  Laterality: N/A;  . LEFT HEART CATHETERIZATION WITH CORONARY ANGIOGRAM N/A 08/08/2013   Procedure: LEFT HEART CATHETERIZATION WITH CORONARY ANGIOGRAM;  Surgeon: Conception Oms  Hassell Done, MD;  Location: [facility] CATH LAB;  Service: Cardiovascular;  Laterality: N/A;  . PATELLA FRACTURE SURGERY Left ~ 1979   bolt + 3 screws to repair tib plateau fx  . PERICARDIAL TAP N/A 07/01/2013   Procedure: PERICARDIAL TAP;  Surgeon: Jettie Booze, MD;  Location: [facility] CATH LAB;  Service: Cardiovascular;  Laterality: N/A;  . RIGHT/LEFT HEART CATH AND CORONARY ANGIOGRAPHY N/A 06/10/2016   Procedure: Right/Left Heart Cath and Coronary Angiography;  Surgeon: Larey Dresser, MD;  Location: [facility] CV LAB;  Service: Cardiovascular;  Laterality: N/A;  . TESTICLE SURGERY  as a child   Undescended testicle brought down into scrotum  . TONSILLECTOMY  1947  . TOTAL HIP ARTHROPLASTY  Right 08/12/2012   Procedure: REMOVAL OF OLD PINS RIGHT HIP AND RIGHT TOTAL HIP ARTHROPLASTY ANTERIOR APPROACH;  Surgeon: Mcarthur Rossetti, MD;  Location: [facility];  Service: Orthopedics;  Laterality: Right;  . TRANSTHORACIC ECHOCARDIOGRAM  10/30/14   Mod LVH, EF 60-65%, normal wall motion, mod mitral regurg, mild PAH       Home Medications    Prior to Admission medications   Medication Sig Start Date End Date Taking? Authorizing Provider  acetaminophen (TYLENOL) 325 MG tablet Take 2 tablets (650 mg total) by mouth every 4 (four) hours as needed for headache or mild pain. 08/09/13  Yes Kilroy, Doreene Burke, PA-C  Ascorbic Acid (VITAMIN C) 1000 MG tablet Take 1,000 mg by mouth daily.   Yes [provider]  aspirin 81 MG tablet Take 81 mg by mouth at bedtime. Reported on 02/26/2015   Yes [provider]  atorvastatin (LIPITOR) 40 MG tablet TAKE ONE TABLET BY MOUTH EVERY DAY Patient taking differently: TAKE 40MG BY MOUTH EVERY DAY 02/17/16  Yes McGowen, Adrian Blackwater, MD  bisoprolol (ZEBETA) 5 MG tablet Take 1 tablet (5 mg total) by mouth daily. 06/12/16  Yes Shirley Friar, PA-C  budesonide-formoterol [facility]) 160-4.5 MCG/ACT inhaler Inhale 2 puffs into the lungs 2 (two) times daily. 05/17/15  Yes McGowen, Adrian Blackwater, MD  buPROPion (WELLBUTRIN SR) 150 MG 12 hr tablet TAKE ONE TABLET BY MOUTH TWICE DAILY Patient taking differently: TAKE 150MG BY MOUTH TWICE DAILY 08/18/16  Yes McGowen, Adrian Blackwater, MD  Cholecalciferol (VITAMIN D-3) 1000 units CAPS Take 1,000 Units by mouth daily. Reported on 02/26/2015   Yes [provider]  citalopram (CELEXA) 40 MG tablet Take 1 tablet (40 mg total) by mouth daily. 08/17/16  Yes McGowen, Adrian Blackwater, MD  digoxin (LANOXIN) 0.125 MG tablet Take 1 tablet (0.125 mg total) by mouth daily. 06/12/16  Yes Shirley Friar, PA-C  fluticasone [facility]) 50 MCG/ACT nasal spray Place 2 sprays into both nostrils daily. 06/04/16  Yes McGowen, Adrian Blackwater, MD    gabapentin (NEURONTIN) 100 MG capsule Take two capsules three times daily Patient taking differently: Take 200 mg by mouth 3 (three) times daily.  06/29/16  Yes McGowen, Adrian Blackwater, MD  guaiFENesin (MUCINEX) 600 MG 12 hr tablet Take 2 tablets (1,200 mg total) by mouth 2 (two) times daily. 11/02/14  Yes Verlee Monte, MD  hyoscyamine (LEVSIN SL) 0.125 MG SL tablet 1-2 tabs po q6h prn diarrhea Patient taking differently: Take 0.125-0.25 mg by mouth every 6 (six) hours as needed (diarrhea).  08/10/16  Yes McGowen, Adrian Blackwater, MD  Insulin Glargine (TOUJEO SOLOSTAR) 300 UNIT/ML SOPN Inject 70 Units into the skin daily. 04/20/16  Yes Elayne Snare, MD  insulin lispro (HUMALOG KWIKPEN) 100 UNIT/ML KiwkPen Inject 18 units  at breakfast 20 units at lunch and 26 units at dinner Patient taking differently: Inject 18-26 Units into the skin See admin instructions. Inject 18 units at breakfast 20 units at lunch and 26 units at dinner 07/17/16  Yes Elayne Snare, MD  isosorbide mononitrate (IMDUR) 30 MG 24 hr tablet TAKE ONE TABLET BY MOUTH DAILY Patient taking differently: TAKE 30MG BY MOUTH DAILY 02/17/16  Yes McGowen, Adrian Blackwater, MD  Multiple Vitamin (MULTIVITAMIN WITH MINERALS) TABS Take 1 tablet by mouth daily.   Yes [provider]  nitroGLYCERIN (NITROSTAT) 0.4 MG SL tablet Place 1 tablet (0.4 mg total) under the tongue every 5 (five) minutes as needed for chest pain. 01/31/16  Yes Jettie Booze, MD  potassium chloride SA (K-DUR,KLOR-CON) 20 MEQ tablet Take 1 tablet (20 mEq total) by mouth daily. 06/11/16  Yes Shirley Friar, PA-C  PROAIR HFA 108 (90 Base) MCG/ACT inhaler INHALE 2 PUFFS INTO THE LUNGS EVERY 4 HOURS AS NEEDED FOR WHEEZING ORSHORTNESS OF BREATH 06/05/16  Yes McGowen, Adrian Blackwater, MD  rivaroxaban (XARELTO) 20 MG TABS tablet Take 1 tablet (20 mg total) by mouth daily with supper. 07/14/16  Yes Larey Dresser, MD  sacubitril-valsartan (ENTRESTO) 24-26 MG Take 1 tablet by mouth 2 (two) times  daily. 08/24/16  Yes Larey Dresser, MD  spironolactone (ALDACTONE) 25 MG tablet Take 1 tablet (25 mg total) by mouth daily. 06/12/16  Yes Shirley Friar, PA-C  SYNJARDY 05-998 MG TABS TAKE ONE TABLET BY MOUTH TWICE DAILY AFTER A MEAL 07/06/16  Yes Elayne Snare, MD  tamsulosin (FLOMAX) 0.4 MG CAPS capsule TAKE 1 CAPSULE BY MOUTH EVERY DAY AFTER SUPPER Patient taking differently: TAKE 0.4MG BY MOUTH EVERY DAY AFTER SUPPER 05/06/16  Yes McGowen, Adrian Blackwater, MD  torsemide (DEMADEX) 20 MG tablet Take 2 tablets (40 mg total) by mouth daily. 06/12/16  Yes Shirley Friar, PA-C  ACCU-CHEK GUIDE test strip USE TO CHECK BLOOD SUGAR 3 TIMES DAILY 07/23/16   McGowen, Adrian Blackwater, MD  ACCU-CHEK GUIDE test strip USE TO CHECK BLOOD SUGAR 3 TIMES DAILY 07/27/16   McGowen, Adrian Blackwater, MD    Family History Family History  Problem Relation Age of Onset  . Heart disease Father   . Heart attack Father   . CVA Mother   . Hypertension Mother   . Diabetes Paternal Grandmother   . Breast cancer Sister   . Diabetes Maternal Uncle   . Heart disease Maternal Uncle   . Stroke Neg Hx     Social History Social History  Substance Use Topics  . Smoking status: Former Smoker    Packs/day: 0.25    Years: 62.00    Types: Cigarettes    Quit date: 10/23/2015  . Smokeless tobacco: Former Systems developer    Quit date: 06/30/2013  . Alcohol use No     Allergies   Morphine and related; Demerol [meperidine]; and Starlix [nateglinide]   Review of Systems Review of Systems  Constitutional: Positive for fatigue. Negative for chills and fever.  Respiratory: Negative for cough and shortness of breath.   Cardiovascular: Negative for chest pain and leg swelling.  Gastrointestinal: Negative for abdominal pain, diarrhea, nausea and vomiting.  Neurological: Positive for light-headedness. Negative for syncope and weakness.  All other systems reviewed and are negative.    Physical Exam Updated Vital Signs BP (!) 129/51    Pulse (!) 58   Temp 98.1 F (36.7 C) (Oral)   Resp 20   Ht _0  (  1.803 m)   Wt 107 kg (236 lb)   SpO2 97%   BMI 32.92 kg/m   Physical Exam  Constitutional: He appears well-developed and well-nourished. No distress.  HENT:  Head: Normocephalic and atraumatic.  Right Ear: External ear normal.  Left Ear: External ear normal.  Eyes: Pupils are equal, round, and reactive to light. Conjunctivae are normal. Right eye exhibits no discharge. Left eye exhibits no discharge.  Neck: Normal range of motion. Neck supple. No JVD present. No tracheal deviation present.  Cardiovascular: Normal rate, regular rhythm and intact distal pulses.   Distant heart sounds, 2+ radial and DP/PT pulses bl, negative Homan's bl   Pulmonary/Chest: Effort normal and breath sounds normal. He exhibits no tenderness.  Equal rise and fall of chest, no increased work of breathing  Abdominal: Soft. Bowel sounds are normal. He exhibits no distension. There is no tenderness.  Musculoskeletal: He exhibits no edema or tenderness.  Neurological: He is alert.  Skin: Skin is warm and dry. No erythema.  Psychiatric: He has a normal mood and affect. His behavior is normal.  Nursing note and vitals reviewed.    ED Treatments / Results  Labs (all labs ordered are listed, but only abnormal results are displayed) Labs Reviewed  COMPREHENSIVE METABOLIC PANEL - Abnormal; Notable for the following:       Result Value   Glucose, Bld 228 (*)    BUN 30 (*)    Creatinine, Ser 1.42 (*)    Total Protein 6.3 (*)    GFR calc non Af Amer 46 (*)    GFR calc Af Amer 53 (*)    All other components within normal limits  CBC WITH DIFFERENTIAL/PLATELET - Abnormal; Notable for the following:    MCH 25.9 (*)    RDW 17.8 (*)    All other components within normal limits  URINALYSIS, ROUTINE W REFLEX MICROSCOPIC - Abnormal; Notable for the following:    Color, Urine STRAW (*)    Glucose, UA >=500 (*)    Bacteria, UA RARE (*)    All  other components within normal limits  I-STAT CG4 LACTIC ACID, ED - Abnormal; Notable for the following:    Lactic Acid, Venous 3.48 (*)    All other components within normal limits  DIGOXIN LEVEL    EKG  EKG Interpretation  Date/Time:  Thursday August 27 2016 16:43:49 EDT Ventricular Rate:  60 PR Interval:    QRS Duration: 166 QT Interval:  500 QTC Calculation: 504 R Axis:   23 Text Interpretation:  Sinus or ectopic atrial rhythm Prolonged PR interval Left bundle branch block No acute changes No significant change since last tracing Confirmed by Varney Biles ([id number]) on 08/27/2016 4:50:37 PM       Radiology Dg Chest 2 View  Result Date: 08/27/2016 CLINICAL DATA:  Hypotension. EXAM: CHEST  2 VIEW COMPARISON:  06/16/2016 FINDINGS: Subtle nodularity right upper lobe is similar to prior. No evidence for focal airspace consolidation, airspace pulmonary edema, or pleural effusion. Lungs are hyperexpanded. Patient is status post CABG. The visualized bony structures of the thorax are intact. Telemetry leads overlie the chest. IMPRESSION: Stable exam. Subtle nodularity right upper lung, better evaluated on previous CT scan of 06/02/2016. No pulmonary edema or pleural effusion. Electronically Signed   By: Misty Stanley M.D.   On: 08/27/2016 18:02    Procedures Procedures (including critical care time)  Medications Ordered in ED Medications  sodium chloride 0.9 % bolus 500 mL (0 mLs Intravenous Stopped  08/27/16 1802)     Initial Impression / Assessment and Plan / ED Course  I have reviewed the triage vital signs and the nursing notes.  Pertinent labs & imaging results that were available during my care of the patient were reviewed by me and considered in my medical decision making (see chart for details).     Patient presents with hypotension and near syncope. Afebrile, initially hypotensive while in the ED at 84/32 with improvement. Recently told to stop losartan and start Entresto  for additional diuresis by his cardiologist Dr. Algernon Huxley. He does not appear significantly volume overloaded today. EKG does not show any significant changes from last tracing and chest x-ray shows no acute cardio pulmonary edema or pleural effusion. No significant changes on his CMP or CBC. UA not concerning for UTI. Doubt PE in the absence of tachycardia or chest pain. He does have an elevated lactate of 3.48 but does not appear to be septic at this time. With hypotension, near syncope, and significant cardiac disease spoke with Dr. Oval Linsey cardiologist who agrees to see the patient for medication management and further evaluation. Spoke with Dr. Lorin Mercy with the triad hospitalist service, who agrees to assume care of the patient and bring him into the hospital for further evaluation.  Final Clinical Impressions(s) / ED Diagnoses   Final diagnoses:  Hypotension, unspecified hypotension type  Medication side effect  Near syncope  Lactic acidosis    New Prescriptions New Prescriptions   No medications on file     Debroah Baller 08/28/16 0008    Varney Biles, MD 09/01/16 1546

## 2016-08-27 NOTE — Telephone Encounter (Signed)
pts wife ([name]) left voicemail on triage line at [phone] with reports that patient has not felt well since starting new medication (entresto) on Monday, reports bp was 77/45 and 76/41  [name] called back at approx 1420- to report family was reporting to ER for further evaluation  Returned call and reported we have received message and agreed that patient should report to ER for further evaluation.   HF team inpatient providers notified

## 2016-08-27 NOTE — ED Notes (Signed)
Pt returned from x-ray.

## 2016-08-27 NOTE — Consult Note (Signed)
Cardiology Consultation:   Patient ID: Joshua [name]; [id number]; [birth year]   Admit date: 08/27/2016 Date of Consult: 08/27/2016  Primary Care Provider: Tammi Sou, MD Primary Cardiologist: Dr [name] Primary Electrophysiologist:  n/a   Patient Profile:   Joshua [name] is a 76 y.o. male with a hx of chronic systolic CHF, COPD, history of asbestos exposure, HTN, HLD, and DM, who is being seen today for the evaluation of hypotension on Entresto at the request of Dr Mariane Masters.  History of Present Illness:   Joshua [name] was seen by Dr [name] on 08/20, he was doing well from a CHF standpoint. His renal function had been stable on Losartan 12.5 mg qd>>so pt was started on Entresto 24-26 mg bid. He is also on torsemide 40 mg qd, spironolactone 25 mg daily, Imdur 30 mg qd, bisoprolol 5 mg daily  Since starting Entresto, pt has had problems with hypotension. BPs are documented 77/45 and 76/41. He has continued all his other meds. Today, his BP was 93/55, but after rx was 71/38. Pt has been feeling weak and came to the ER.   In the ER, his initial BP was 84/32, it has come up with a 500cc saline bolus. Currently, he does not feel volume overloaded, has not had any chest pain. Breathing is at baseline. Weakness has improved.    Past Medical History:  Diagnosis Date  . Acute respiratory distress 06/04/2016  . Adenomatous colon polyp 10/16/2011   Repeat 2018  . Arthritis     Hips, R>L & KNEES  . CAD, multiple vessel    3V CAD cath 08/08/13----CABG done shortly after.  . 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 pylori neg, no dysplasia or malignancy.  . Chronic diastolic heart failure ([place]) 2016  . Chronic renal insufficiency, stage 3 (moderate) 2018   GFR 50s  . COPD (chronic obstructive pulmonary disease) ([place])    GOLD II.  Spirometry  2004 borderline obstruction; 2015 mod obst: noncompliant with bid symbicort so pulmonologist switched him to a once  daily inhaler: Trelegy ellipta 12/2015.  Oxygen prn: goal 88-92%.  . Diabetic nephropathy ([place])    Elevated urine microalb/cr 03/2011  . DM type 2 (diabetes mellitus, type 2) ([place])    Poor control on max oral meds--pt eventually agreed to insulin therapy.  As of 2017 his DM is being managed by Dr. Dwyane Dee in endo.  . DOE (dyspnea on exertion)    COPD + chronic diastolic HF  . Erectile dysfunction    Normal testosterone  . Hearing loss of both ears 2016   Hearing aids  . Hyperlipidemia   . Hyperplastic colon polyp 2001  . Hypertension   . Iron deficiency anemia 2014   03/2012 capsule endoscopy showed 2 AVMs--likely responsible for his IDA--lifetime iron supp recommended + q36moCBCs.  . Macular degeneration, dry    Mild, bilat (Optometrist, [place]ife[place] [place]  . Obesity   . Open toe wound 11/2015   Wound care clinic appt made and then canceled when toe improved.  . Other and unspecified angina pectoris   . PAD (peripheral artery disease) [place] 02/2016   Abnormal ABI's and waveforms: LE arterial duplex ordered for f/u as per cardiologist's recommendation.  Vasc eval by Dr. CBridgett Larsson impression was minimal PAD, recommended maximize med mgmt.  .Marland KitchenPAF (paroxysmal atrial fibrillation) ([place] 10/2014   xarelto  . Pericardial effusion with cardiac tamponade 6//29/15   pericardiocentesis  was done,  Infectious/inflamm (cytology showed NO MALIGNANT CELLS)  . Pleural plaque    Pleural plaques/asbestosis changes on CT chest done by pulm 05/2016.  [name] Pneumonia   . Tobacco dependence in remission    100+ pack-yr hx: quit after CABG    Past Surgical History:  Procedure Laterality Date  . APPENDECTOMY  1957  . CARDIAC CATHETERIZATION  08/08/2013  . CATARACT EXTRACTION W/ INTRAOCULAR LENS  IMPLANT, BILATERAL  04/08/2006 & 04/22/2006  . CATARACT EXTRACTION W/ INTRAOCULAR LENS  IMPLANT, BILATERAL Bilateral   . CHOLECYSTECTOMY OPEN  1999  . COLONOSCOPY  10/16/2011    Procedure: COLONOSCOPY;  Surgeon: Inda Castle, MD;  Location: [facility];  Service: Endoscopy;  Laterality: N/A;  . CORONARY ARTERY BYPASS GRAFT N/A 08/14/2013   Procedure: CORONARY ARTERY BYPASS GRAFTING (CABG);  Surgeon: Melrose Nakayama, MD;  Location: [facility];  Service: Open Heart Surgery;  Laterality: N/A;  Times 4   using left internal mammary artery and endoscopically harvested bilateral saphenous vein  . ESOPHAGOGASTRODUODENOSCOPY  02/25/12   Atrophic gastritis with a few erosions--capsule endoscopy planned as of 02/25/12 (Dr. Deatra Ina).  [name] HARDWARE REMOVAL Right 08/12/2012   Procedure: HARDWARE REMOVAL;  Surgeon: Mcarthur Rossetti, MD;  Location: [facility];  Service: Orthopedics;  Laterality: Right;  . HIP SURGERY Right 1954   Repair of slipped capital femoral epiphysis.  . INTRAOPERATIVE TRANSESOPHAGEAL ECHOCARDIOGRAM N/A 08/14/2013   Normal LV function. Procedure: INTRAOPERATIVE TRANSESOPHAGEAL ECHOCARDIOGRAM;  Surgeon: Melrose Nakayama, MD;  Location: [facility];  Service: Open Heart Surgery;  Laterality: N/A;  . LEFT HEART CATHETERIZATION WITH CORONARY ANGIOGRAM N/A 08/08/2013   Procedure: LEFT HEART CATHETERIZATION WITH CORONARY ANGIOGRAM;  Surgeon: Jettie Booze, MD;  Location[facility] CATH LAB;  Service: Cardiovascular;  Laterality: N/A;  . PATELLA FRACTURE SURGERY Left ~ 1979   bolt + 3 screws to repair tib plateau fx  . PERICARDIAL TAP N/A 07/01/2013   Procedure: PERICARDIAL TAP;  Surgeon: Jettie Booze, MD;  Location: [facility] CATH LAB;  Service: Cardiovascular;  Laterality: N/A;  . RIGHT/LEFT HEART CATH AND CORONARY ANGIOGRAPHY N/A 06/10/2016   Procedure: Right/Left Heart Cath and Coronary Angiography;  Surgeon: Larey Dresser, MD;  Location: [facility] CV LAB;  Service: Cardiovascular;  Laterality: N/A;  . TESTICLE SURGERY  as a child   Undescended testicle brought down into scrotum  . TONSILLECTOMY  1947  . TOTAL HIP ARTHROPLASTY Right 08/12/2012   Procedure: REMOVAL OF OLD  PINS RIGHT HIP AND RIGHT TOTAL HIP ARTHROPLASTY ANTERIOR APPROACH;  Surgeon: Mcarthur Rossetti, MD;  Location: [facility];  Service: Orthopedics;  Laterality: Right;  . TRANSTHORACIC ECHOCARDIOGRAM  10/30/14   Mod LVH, EF 60-65%, normal wall motion, mod mitral regurg, mild PAH     Inpatient Medications: Scheduled Meds:  Continuous Infusions:  PRN Meds:  Prior to Admission medications   Medication Sig Start Date End Date Taking? Authorizing Provider  acetaminophen (TYLENOL) 325 MG tablet Take 2 tablets (650 mg total) by mouth every 4 (four) hours as needed for headache or mild pain. 08/09/13  Yes Kilroy, Doreene Burke, PA-C  Ascorbic Acid (VITAMIN C) 1000 MG tablet Take 1,000 mg by mouth daily.   Yes [provider]  aspirin 81 MG tablet Take 81 mg by mouth at bedtime. Reported on 02/26/2015   Yes [provider]  atorvastatin (LIPITOR) 40 MG tablet TAKE ONE TABLET BY MOUTH EVERY DAY Patient taking differently: TAKE 40MG BY MOUTH EVERY DAY 02/17/16  Yes McGowen, Arnette Norris  H, MD  bisoprolol (ZEBETA) 5 MG tablet Take 1 tablet (5 mg total) by mouth daily. 06/12/16  Yes Shirley Friar, PA-C  budesonide-formoterol [facility]) 160-4.5 MCG/ACT inhaler Inhale 2 puffs into the lungs 2 (two) times daily. 05/17/15  Yes McGowen, Adrian Blackwater, MD  buPROPion (WELLBUTRIN SR) 150 MG 12 hr tablet TAKE ONE TABLET BY MOUTH TWICE DAILY Patient taking differently: TAKE 150MG BY MOUTH TWICE DAILY 08/18/16  Yes McGowen, Adrian Blackwater, MD  Cholecalciferol (VITAMIN D-3) 1000 units CAPS Take 1,000 Units by mouth daily. Reported on 02/26/2015   Yes [provider]  citalopram (CELEXA) 40 MG tablet Take 1 tablet (40 mg total) by mouth daily. 08/17/16  Yes McGowen, Adrian Blackwater, MD  digoxin (LANOXIN) 0.125 MG tablet Take 1 tablet (0.125 mg total) by mouth daily. 06/12/16  Yes Shirley Friar, PA-C  fluticasone [facility]) 50 MCG/ACT nasal spray Place 2 sprays into both nostrils daily. 06/04/16  Yes McGowen,  Adrian Blackwater, MD  gabapentin (NEURONTIN) 100 MG capsule Take two capsules three times daily Patient taking differently: Take 200 mg by mouth 3 (three) times daily.  06/29/16  Yes McGowen, Adrian Blackwater, MD  guaiFENesin (MUCINEX) 600 MG 12 hr tablet Take 2 tablets (1,200 mg total) by mouth 2 (two) times daily. 11/02/14  Yes Verlee Monte, MD  hyoscyamine (LEVSIN SL) 0.125 MG SL tablet 1-2 tabs po q6h prn diarrhea Patient taking differently: Take 0.125-0.25 mg by mouth every 6 (six) hours as needed (diarrhea).  08/10/16  Yes McGowen, Adrian Blackwater, MD  Insulin Glargine (TOUJEO SOLOSTAR) 300 UNIT/ML SOPN Inject 70 Units into the skin daily. 04/20/16  Yes Elayne Snare, MD  insulin lispro (HUMALOG KWIKPEN) 100 UNIT/ML KiwkPen Inject 18 units at breakfast 20 units at lunch and 26 units at dinner Patient taking differently: Inject 18-26 Units into the skin See admin instructions. Inject 18 units at breakfast 20 units at lunch and 26 units at dinner 07/17/16  Yes Elayne Snare, MD  isosorbide mononitrate (IMDUR) 30 MG 24 hr tablet TAKE ONE TABLET BY MOUTH DAILY Patient taking differently: TAKE 30MG BY MOUTH DAILY 02/17/16  Yes McGowen, Adrian Blackwater, MD  Multiple Vitamin (MULTIVITAMIN WITH MINERALS) TABS Take 1 tablet by mouth daily.   Yes [provider]  nitroGLYCERIN (NITROSTAT) 0.4 MG SL tablet Place 1 tablet (0.4 mg total) under the tongue every 5 (five) minutes as needed for chest pain. 01/31/16  Yes Jettie Booze, MD  potassium chloride SA (K-DUR,KLOR-CON) 20 MEQ tablet Take 1 tablet (20 mEq total) by mouth daily. 06/11/16  Yes Shirley Friar, PA-C  PROAIR HFA 108 (90 Base) MCG/ACT inhaler INHALE 2 PUFFS INTO THE LUNGS EVERY 4 HOURS AS NEEDED FOR WHEEZING ORSHORTNESS OF BREATH 06/05/16  Yes McGowen, Adrian Blackwater, MD  rivaroxaban (XARELTO) 20 MG TABS tablet Take 1 tablet (20 mg total) by mouth daily with supper. 07/14/16  Yes Larey Dresser, MD  sacubitril-valsartan (ENTRESTO) 24-26 MG Take 1 tablet by mouth 2  (two) times daily. 08/24/16  Yes Larey Dresser, MD  spironolactone (ALDACTONE) 25 MG tablet Take 1 tablet (25 mg total) by mouth daily. 06/12/16  Yes Shirley Friar, PA-C  SYNJARDY 05-998 MG TABS TAKE ONE TABLET BY MOUTH TWICE DAILY AFTER A MEAL 07/06/16  Yes Elayne Snare, MD  tamsulosin (FLOMAX) 0.4 MG CAPS capsule TAKE 1 CAPSULE BY MOUTH EVERY DAY AFTER SUPPER Patient taking differently: TAKE 0.4MG BY MOUTH EVERY DAY AFTER SUPPER 05/06/16  Yes McGowen, Adrian Blackwater, MD  torsemide [facility])  20 MG tablet Take 2 tablets (40 mg total) by mouth daily. 06/12/16  Yes Shirley Friar, PA-C  ACCU-CHEK GUIDE test strip USE TO CHECK BLOOD SUGAR 3 TIMES DAILY 07/23/16   McGowen, Adrian Blackwater, MD  ACCU-CHEK GUIDE test strip USE TO CHECK BLOOD SUGAR 3 TIMES DAILY 07/27/16   McGowen, Adrian Blackwater, MD    Allergies:    Allergies  Allergen Reactions  . Morphine And Related Other (See Comments)    Drenched with perspiration  . Demerol [Meperidine] Nausea Only  . Starlix [Nateglinide] Other (See Comments)    gassy    Social History:   Social History   Social History  . Marital status: Married    Spouse name: [name]  . Number of children: 4  . Years of education: N/A   Occupational History  . bus operator    Social History Main Topics  . Smoking status: Former Smoker    Packs/day: 0.25    Years: 62.00    Types: Cigarettes    Quit date: 10/23/2015  . Smokeless tobacco: Former Systems developer    Quit date: 06/30/2013  . Alcohol use No  . Drug use: No  . Sexual activity: No   Other Topics Concern  . Not on file   Social History Narrative   Married, 4 children.   Formerly a Medical illustrator.   Level of education: HS.     +tobacco--lifelong/ quit 2015).  No alcohol or drugs.   No exercise.  +excessive caffeine.    Family History:   The patient's family history includes Breast cancer in his sister; CVA in his mother; Diabetes in his maternal uncle and paternal grandmother; Heart attack in  his father; Heart disease in his father and maternal uncle; Hypertension in his mother. There is no history of Stroke. Pt indicated that his mother is deceased. He indicated that his father is deceased. He indicated that the status of his sister is unknown. He indicated that his maternal grandmother is deceased. He indicated that his maternal grandfather is deceased. He indicated that his paternal grandmother is deceased. He indicated that his paternal grandfather is deceased. He indicated that the status of his neg hx is unknown.    ROS:  Please see the history of present illness.  All other ROS reviewed and negative.      Physical Exam/Data:   Vitals:   08/27/16 1800 08/27/16 1815 08/27/16 1830 08/27/16 1845  BP: 124/73 (!) 125/49 (!) 114/45 (!) 129/51  Pulse: (!) 58 60 (!) 59 (!) 58  Resp: _0 Temp:      TempSrc:      SpO2: 93% 93% 91% 97%  Weight:      Height:        Intake/Output Summary (Last 24 hours) at 08/27/16 1909 Last data filed at 08/27/16 1803  Gross per 24 hour  Intake             1000 ml  Output                0 ml  Net             1000 ml   Filed Weights   08/27/16 1619  Weight: 236 lb (107 kg)   Body mass index is 32.92 kg/m.  General:  Well nourished, well developed, in no acute distress HEENT: normal Lymph: no adenopathy Neck: no JVD Endocrine:  No thryomegaly Vascular: No carotid bruits; 4/4 extremity pulses 2+ bilaterally  Cardiac:  normal S1, S2; RRR; no murmur  Lungs:  clear to auscultation bilaterally, no wheezing, rhonchi or rales  Abd: soft, nontender, no hepatomegaly  Ext: no edema Musculoskeletal:  No deformities, BUE and BLE strength normal and equal Skin: warm and dry  Neuro:  CNs 2-12 intact, no focal abnormalities noted Psych:  Normal affect   EKG:  The EKG was personally reviewed and demonstrates:  SR, LBBB, QRS duration 166 ms, no sig change Telemetry:  Telemetry was personally reviewed and demonstrates:  SR  Relevant  CV Studies: Right/Left Heart Cath and Coronary Angiography 06/10/2016 1. Mildly elevated left and right heart filling pressures.  2. Preserved cardiac output.  3. Severe 3 vessel native coronary disease.  The bypass grafts are all patent.  No interventional target.  Will resume po diuresis this evening.  Will resume Xarelto in the morning.    ECHO: 06/06/2016 - Left ventricle: The cavity size was mildly dilated. There was   mild concentric hypertrophy. Systolic function was severely   reduced. The estimated ejection fraction was in the range of 25%   to 30%. Severe diffuse hypokinesis. Features are consistent with   a pseudonormal left ventricular filling pattern, with concomitant   abnormal relaxation and increased filling pressure (grade 2   diastolic dysfunction). - Ventricular septum: Septal motion showed abnormal function,   dyssynergy, and paradox. These changes are consistent with a left   bundle branch block. - Mitral valve: There was moderate regurgitation directed centrally. - Left atrium: The atrium was moderately to severely dilated. - Right ventricle: The cavity size was mildly dilated. Systolic   function was mildly reduced. - Right atrium: The atrium was moderately dilated. - Pulmonary arteries: Systolic pressure was mildly to moderately   increased. PA peak pressure: 48 mm Hg (S). Impressions: - Compared to 2016, left ventricular systolic function is much   worse.   Laboratory Data:  Chemistry  Recent Labs Lab 08/24/16 1053 08/27/16 1630  NA 138 139  K 4.4 4.4  CL 104 102  CO2 24 25  GLUCOSE 282* 228*  BUN 19 30*  CREATININE 1.25* 1.42*  CALCIUM 8.8* 9.8  GFRNONAA 54* 46*  GFRAA >60 53*  ANIONGAP 10 12     Recent Labs Lab 08/27/16 1630  PROT 6.3*  ALBUMIN 3.5  AST 24  ALT 22  ALKPHOS 81  BILITOT 0.5   Hematology  Recent Labs Lab 08/24/16 1053 08/27/16 1630  WBC 8.1 9.8  RBC 5.04 5.37  HGB 13.2 13.9  HCT 42.1 44.6  MCV 83.5  83.1  MCH 26.2 25.9*  MCHC 31.4 31.2  RDW 17.7* 17.8*  PLT 177 200    Radiology/Studies:  Dg Chest 2 View  Result Date: 08/27/2016 CLINICAL DATA:  Hypotension. EXAM: CHEST  2 VIEW COMPARISON:  06/16/2016 FINDINGS: Subtle nodularity right upper lobe is similar to prior. No evidence for focal airspace consolidation, airspace pulmonary edema, or pleural effusion. Lungs are hyperexpanded. Patient is status post CABG. The visualized bony structures of the thorax are intact. Telemetry leads overlie the chest. IMPRESSION: Stable exam. Subtle nodularity right upper lung, better evaluated on previous CT scan of 06/02/2016. No pulmonary edema or pleural effusion. Electronically Signed   By: Misty Stanley M.D.   On: 08/27/2016 18:02    Assessment and Plan:   Principal Problem:   Weakness generalized Active Problems:   Hypotension due to medication    Signed, Rosaria Ferries, PA-C  08/27/2016 7:09 PM

## 2016-08-27 NOTE — ED Notes (Signed)
PA at bedside.

## 2016-08-27 NOTE — ED Notes (Signed)
Yellow band and socks applied to pt.

## 2016-08-28 DIAGNOSIS — T887XXA Unspecified adverse effect of drug or medicament, initial encounter: Secondary | ICD-10-CM | POA: Diagnosis not present

## 2016-08-28 DIAGNOSIS — I480 Paroxysmal atrial fibrillation: Secondary | ICD-10-CM | POA: Diagnosis not present

## 2016-08-28 DIAGNOSIS — R55 Syncope and collapse: Secondary | ICD-10-CM | POA: Diagnosis not present

## 2016-08-28 DIAGNOSIS — N183 Chronic kidney disease, stage 3 (moderate): Secondary | ICD-10-CM

## 2016-08-28 DIAGNOSIS — I952 Hypotension due to drugs: Secondary | ICD-10-CM | POA: Diagnosis not present

## 2016-08-28 DIAGNOSIS — I5022 Chronic systolic (congestive) heart failure: Secondary | ICD-10-CM

## 2016-08-28 DIAGNOSIS — R531 Weakness: Secondary | ICD-10-CM | POA: Diagnosis not present

## 2016-08-28 LAB — BASIC METABOLIC PANEL
Anion gap: 14 (ref 5–15)
BUN: 31 mg/dL — AB (ref 6–20)
CHLORIDE: 100 mmol/L — AB (ref 101–111)
CO2: 22 mmol/L (ref 22–32)
CREATININE: 1.35 mg/dL — AB (ref 0.61–1.24)
Calcium: 9.4 mg/dL (ref 8.9–10.3)
GFR calc non Af Amer: 49 mL/min — ABNORMAL LOW (ref >60)
GFR, EST AFRICAN AMERICAN: 57 mL/min — AB (ref >60)
Glucose, Bld: 296 mg/dL — ABNORMAL HIGH (ref 65–99)
Potassium: 4.1 mmol/L (ref 3.5–5.1)
Sodium: 136 mmol/L (ref 135–145)

## 2016-08-28 LAB — CBC
HCT: 43.1 % (ref 39.0–52.0)
Hemoglobin: 12.8 g/dL — ABNORMAL LOW (ref 13.0–17.0)
MCH: 25 pg — AB (ref 26.0–34.0)
MCHC: 29.7 g/dL — ABNORMAL LOW (ref 30.0–36.0)
MCV: 84.2 fL (ref 78.0–100.0)
PLATELETS: 175 10*3/uL (ref 150–400)
RBC: 5.12 MIL/uL (ref 4.22–5.81)
RDW: 18.2 % — ABNORMAL HIGH (ref 11.5–15.5)
WBC: 7.5 10*3/uL (ref 4.0–10.5)

## 2016-08-28 LAB — LACTIC ACID, PLASMA
LACTIC ACID, VENOUS: 1.9 mmol/L (ref 0.5–1.9)
LACTIC ACID, VENOUS: 1.6 mmol/L (ref 0.5–1.9)

## 2016-08-28 LAB — GLUCOSE, CAPILLARY
Glucose-Capillary: 285 mg/dL — ABNORMAL HIGH (ref 65–99)
Glucose-Capillary: 307 mg/dL — ABNORMAL HIGH (ref 65–99)

## 2016-08-28 LAB — MRSA PCR SCREENING: MRSA by PCR: NEGATIVE

## 2016-08-28 LAB — TSH: TSH: 1.612 u[IU]/mL (ref 0.350–4.500)

## 2016-08-28 LAB — CBG MONITORING, ED: GLUCOSE-CAPILLARY: 298 mg/dL — AB (ref 65–99)

## 2016-08-28 MED ORDER — LOSARTAN POTASSIUM 25 MG PO TABS: 12.5000 mg | ORAL_TABLET | Freq: Every day | ORAL | Status: DC

## 2016-08-28 MED ORDER — INSULIN ASPART 100 UNIT/ML ~~LOC~~ SOLN: 26.0000 [IU] | Freq: Every day | SUBCUTANEOUS | Status: DC

## 2016-08-28 MED ORDER — INSULIN ASPART 100 UNIT/ML ~~LOC~~ SOLN
18.0000 [IU] | Freq: Every day | SUBCUTANEOUS | Status: DC
  Administered 2016-08-28: 18 [IU] via SUBCUTANEOUS

## 2016-08-28 MED ORDER — INSULIN ASPART 100 UNIT/ML ~~LOC~~ SOLN
20.0000 [IU] | Freq: Every day | SUBCUTANEOUS | Status: DC
  Administered 2016-08-28: 20 [IU] via SUBCUTANEOUS

## 2016-08-28 MED ORDER — LOSARTAN POTASSIUM 25 MG PO TABS: 12.5000 mg | ORAL_TABLET | Freq: Every day | ORAL | 0 refills | Status: DC

## 2016-08-28 NOTE — Discharge Instructions (Signed)
Near-Syncope Near-syncope is when you suddenly get weak or dizzy, or you feel like you might pass out (faint). During an episode of near-syncope, you may:  Feel dizzy or light-headed.  Feel sick to your stomach (nauseous).  See all white or all black.  Have cold, clammy skin.  If you passed out, get help right away.Call your local emergency services (911 in the [place] Do not drive yourself to the hospital. Follow these instructions at home: Pay attention to any changes in your symptoms. Take these actions to help with your condition:  Have someone stay with you until you feel stable.  Do not drive, use machinery, or play sports until your doctor says it is okay.  Keep all follow-up visits as told by your doctor. This is important.  If you start to feel like you might pass out, lie down right away and raise (elevate) your feet above the level of your heart. Breathe deeply and steadily. Wait until all of the symptoms are gone.  Drink enough fluid to keep your pee (urine) clear or pale yellow.  If you are taking blood pressure or heart medicine, get up slowly and spend many minutes getting ready to sit and then stand. This can help with dizziness.  Take over-the-counter and prescription medicines only as told by your doctor.  Get help right away if:  You have a very bad headache.  You have unusual pain in your chest, tummy, or back.  You are bleeding from your mouth or rectum.  You have black or tarry poop (stool).  You have a very fast or uneven heartbeat (palpitations).  You pass out one time or more than once.  You have jerky movements that you cannot control (seizure).  You are confused.  You have trouble walking.  You are very weak.  You have vision problems. These symptoms may be an emergency. Do not wait to see if the symptoms will go away. Get medical help right away. Call your local emergency services (911 in the [place] Do not drive yourself to the  hospital. This information is not intended to replace advice given to you by your health care provider. Make sure you discuss any questions you have with your health care provider. Document Released: 06/10/2007 Document Revised: 05/30/2015 Document Reviewed: 09/05/2014 Elsevier Interactive Patient Education  2017 [employer].

## 2016-08-28 NOTE — Progress Notes (Signed)
Patient ID: Joshua [name], male   DOB: [birth year], 76 y.o.   MRN: [id number]     Advanced Heart Failure Rounding Note  Cardiologist: [name]  Subjective:    Patient was admitted 8/23 with hypotension in setting of starting Entresto.  He had stopped losartan as instructed.  Delene Loll was stopped, this morning BP is on the high side and he denies lightheadedness.  Currently sitting up eating breakfast.  Creatinine is stable.   Objective:   Weight Range: 232 lb 4.8 oz (105.4 kg) Body mass index is 32.4 kg/m.   Vital Signs:   Temp:  [98.1 F (36.7 C)-98.4 F (36.9 C)] 98.4 F (36.9 C) (08/24 0311) Pulse Rate:  [57-70] 67 (08/24 0537) Resp:  [11-22] 14 (08/24 0537) BP: (84-159)/(32-76) 159/60 (08/24 0537) SpO2:  [84 %-99 %] 97 % (08/24 0537) Weight:  [232 lb 4.8 oz (105.4 kg)-236 lb (107 kg)] 232 lb 4.8 oz (105.4 kg) (08/24 0311) Last BM Date: 08/27/16  Weight change: Filed Weights   08/27/16 1619 08/28/16 0311  Weight: 236 lb (107 kg) 232 lb 4.8 oz (105.4 kg)    Intake/Output:   Intake/Output Summary (Last 24 hours) at 08/28/16 0817 Last data filed at 08/28/16 0734  Gross per 24 hour  Intake             1000 ml  Output             2275 ml  Net            -1275 ml      Physical Exam    General:  Well appearing. No resp difficulty HEENT: Normal Neck: Supple. JVP 7 cm. Carotids 2+ bilat; no bruits. No lymphadenopathy or thyromegaly appreciated. Cor: PMI nondisplaced. Regular rate & rhythm. No rubs, gallops or murmurs. Lungs: Clear Abdomen: Soft, nontender, nondistended. No hepatosplenomegaly. No bruits or masses. Good bowel sounds. Extremities: No cyanosis, clubbing, rash.  Trace ankle edema.  Neuro: Alert & orientedx3, cranial nerves grossly intact. moves all 4 extremities w/o difficulty. Affect pleasant   Telemetry   Personally reviewed, NSR   Labs    CBC  Recent Labs  08/27/16 1630 08/28/16 0015  WBC 9.8 7.5  NEUTROABS 6.8  --   HGB 13.9 12.8*  HCT  44.6 43.1  MCV 83.1 84.2  PLT 200 948   Basic Metabolic Panel  Recent Labs  08/27/16 1630 08/28/16 0015  NA 139 136  K 4.4 4.1  CL 102 100*  CO2 25 22  GLUCOSE 228* 296*  BUN 30* 31*  CREATININE 1.42* 1.35*  CALCIUM 9.8 9.4   Liver Function Tests  Recent Labs  08/27/16 1630  AST 24  ALT 22  ALKPHOS 81  BILITOT 0.5  PROT 6.3*  ALBUMIN 3.5   No results for input(s): LIPASE, AMYLASE in the last 72 hours. Cardiac Enzymes No results for input(s): CKTOTAL, CKMB, CKMBINDEX, TROPONINI in the last 72 hours.  BNP: BNP (last 3 results)  Recent Labs  10/23/15 1831 06/04/16 1155 06/19/16 1138  BNP 47.7 462.5* 454.6*    ProBNP (last 3 results) No results for input(s): PROBNP in the last 8760 hours.   D-Dimer No results for input(s): DDIMER in the last 72 hours. Hemoglobin A1C No results for input(s): HGBA1C in the last 72 hours. Fasting Lipid Panel No results for input(s): CHOL, HDL, LDLCALC, TRIG, CHOLHDL, LDLDIRECT in the last 72 hours. Thyroid Function Tests  Recent Labs  08/28/16 0015  TSH 1.612    Other results:  Imaging    Dg Chest 2 View  Result Date: 08/27/2016 CLINICAL DATA:  Hypotension. EXAM: CHEST  2 VIEW COMPARISON:  06/16/2016 FINDINGS: Subtle nodularity right upper lobe is similar to prior. No evidence for focal airspace consolidation, airspace pulmonary edema, or pleural effusion. Lungs are hyperexpanded. Patient is status post CABG. The visualized bony structures of the thorax are intact. Telemetry leads overlie the chest. IMPRESSION: Stable exam. Subtle nodularity right upper lung, better evaluated on previous CT scan of 06/02/2016. No pulmonary edema or pleural effusion. Electronically Signed   By: Misty Stanley M.D.   On: 08/27/2016 18:02      Medications:     Scheduled Medications: . aspirin  81 mg Oral QHS  . atorvastatin  40 mg Oral Daily  . bisoprolol  5 mg Oral Daily  . buPROPion  150 mg Oral BID  . citalopram  40 mg  Oral Daily  . digoxin  0.125 mg Oral Daily  . fluticasone  2 spray Each Nare Daily  . gabapentin  200 mg Oral TID  . guaiFENesin  1,200 mg Oral BID  . insulin aspart  0-15 Units Subcutaneous TID WC  . insulin aspart  0-5 Units Subcutaneous QHS  . insulin aspart  18 Units Subcutaneous Q breakfast  . insulin aspart  20 Units Subcutaneous Q lunch  . insulin aspart  26 Units Subcutaneous Q supper  . insulin glargine  70 Units Subcutaneous Daily  . isosorbide mononitrate  30 mg Oral Daily  . [START ON 08/29/2016] losartan  12.5 mg Oral Daily  . mometasone-formoterol  2 puff Inhalation BID  . rivaroxaban  20 mg Oral Q supper  . sodium chloride flush  3 mL Intravenous Q12H  . spironolactone  25 mg Oral Daily  . tamsulosin  0.4 mg Oral QPC supper  . torsemide  40 mg Oral Daily     Infusions: . lactated ringers Stopped (08/28/16 0548)     PRN Medications:  acetaminophen **OR** acetaminophen, ondansetron **OR** ondansetron (ZOFRAN) IV    Patient Profile   Joshua [name] is a 76 y.o. male with PMH of CAD s/p CABG, chronic systolic CHF/ischemic cardiomyopathy, COPD, history of asbestos exposure, HTN, HLD, and DM.   Assessment/Plan   1. Hypotension/orthostasis: Occurred in setting of starting Entresto, now improved off Entresto.  Would stop Entresto for now, restart on losartan at 12.5 mg daily tomorrow.  2. Chronic systolic CHF: Ischemic cardiomyopathy.  Echo (6/18) with EF 25-30%.  NYHA class III symptoms, stable.  Volume status looks stable.  BP now stable.  - As above, restart losartan 12.5 mg tomorrow.  - Continue digoxin, level ok.  - Continue bisoprolol and spironolactone.  - Continue home dose of torsemide.  3. CAD: s/p CABG.  Cath in 6/18 without interventional target.  No chest pain.  Continue statin, not on ASA with Xarelto use.  4. Atrial fibrillation: Paroxysmal, he is in NSR today.  Continue Xarelto.  5. Disposition: Will have him walk in hall today, if does ok should be  able to go home.  Would send home on current home meds with only change being stop Entresto and start back on losartan 12.5 mg daily tomorrow.   Length of Stay: 0   Loralie Champagne, MD  08/28/2016, 8:17 AM  Advanced Heart Failure Team Pager [phone] (M-F; 7a - 4p)  Please contact [facility] for night-coverage after hours (4p -7a ) and weekends on [email or web address]

## 2016-08-28 NOTE — Progress Notes (Addendum)
Patient ambulated about 150 ft using a rolling walker on room air, pre ambuilation O2 sat was 95% on room air during ambulation patient's O2 sat dropped to below 80's patient was instructed to do pursed lip breathing, patient returned back to room placed on 2L O2, O2 went up to 97%. Patient denise SOB or dizziness during and after ambulation.  Upon inquiry patient said he wears oxygen at home and that the last time he used it was about a month ago. Patient instructed to continue to use his home O2 to maintain adequate perfusion, patient and his wife verbalized understanding, will continue to monitor.

## 2016-08-28 NOTE — Progress Notes (Signed)
Patient in a stable condition, discharge education reviwed with patient and his wife at bedside, tele dc ccmd notified, iv removed, pt belongings at bedside, patient to be transported home by his wife

## 2016-08-28 NOTE — Discharge Summary (Signed)
Physician Discharge Summary  Joshua [name] HWT:[id number] DOB: 09-07-1940 DOA: 08/27/2016  PCP: Tammi Sou, MD  Admit date: 08/27/2016 Discharge date: 08/28/2016  Admitted From: Home Disposition:  Home  Recommendations for Outpatient Follow-up:  1. Follow up with PCP in 1-2 weeks 2. She has appointment to see his cardiologist Dr. [name] on 9/28. 3. Patient is taken off and  Entresto . All his home medications are resumed. Start taking losartan from 8/25.  Home Health: None Equipment/Devices: Chronic 2 L oxygen via nasal cannula  Discharge Condition: Fair CODE STATUS: Full code Diet recommendation: Heart Healthy / Carb Modified     Discharge Diagnoses:  Principal Problem:   Hypotension due to medication   Active Problems:   Weakness generalized   Near syncope   HTN (hypertension), benign   CKD (chronic kidney disease) stage 3, GFR 30-59 ml/min   PAF (paroxysmal atrial fibrillation) (HCC)   Diabetes mellitus, insulin-dependent (HCC)   Chronic systolic CHF (congestive heart failure) ([facility])   Brief narrative/history of present illness Please refer to admission H&P for details, in brief, 76 year old male with history of tonic systolic and diastolic CHF, hypertension, paroxysmal A. fib on anticoagulation, insulin-dependent diabetes mellitus and COPD on home O2 who presented with dizziness and near syncope. Patient was admitted in May this year with acute systolic and diastolic CHF with echo showing EF of 25-30%. Left heart catheterization showed 3 vessel CAD with patent graft. He was diuresed and discharged home. Upon following up with his cardiologist Dr. [name] on 8/20 his losartan was switched to [facility]. Patient started taking the medication on 8/22. Next morning his systolic blood pressure was in the 90s but continue to take his home medications. He then became dizzy and felt like passing out. His blood pressure was then 77/48 mmHg. In the ED he was hypotensive with blood  pressure of 84/32 mmHg. Lactic acid of 2.48.  Patient placed on observation.  Hospital course Hypotension with near syncope Secondary to [facility] that was recently started. All home blood pressure medication were held. Stable on telemetry. Blood pressure improved this morning. Patient no longer dizzy or has near syncopal symptoms. He was able tablet 150 feet without any difficulty. His cardiologist recommends to place him back on losartan (starting tomorrow) and discontinue Entresto. Resumed all his remaining home medications.  Patient stable to be discharged home and will follow-up with his cardiologist as scheduled.  Chronic systolic CHF/CAD/PAF Continue digoxin, Imdur, Lipitor, torsemide, Aldactone and Xarelto.  Chronic kidney disease stage III At baseline  Uncontrolled type 2 diabetes mellitus with hyperglycemia Continue home insulin regimen.  COPD On home O2. Continue inhalers  Consults: Cardiology Disposition: Home Family communication: Wife at bedside   . Discharge Instructions   Allergies as of 08/28/2016      Reactions   Morphine And Related Other (See Comments)   Drenched with perspiration   Entresto [sacubitril-valsartan]    hypotension   Demerol [meperidine] Nausea Only   Starlix [nateglinide] Other (See Comments)   gassy      Medication List    TAKE these medications   ACCU-CHEK GUIDE test strip Generic drug:  glucose blood USE TO CHECK BLOOD SUGAR 3 TIMES DAILY   ACCU-CHEK GUIDE test strip Generic drug:  glucose blood USE TO CHECK BLOOD SUGAR 3 TIMES DAILY   acetaminophen 325 MG tablet Commonly known as:  TYLENOL Take 2 tablets (650 mg total) by mouth every 4 (four) hours as needed for headache or mild pain.   aspirin 81  MG tablet Take 81 mg by mouth at bedtime. Reported on 02/26/2015   atorvastatin 40 MG tablet Commonly known as:  LIPITOR TAKE ONE TABLET BY MOUTH EVERY DAY What changed:  See the new instructions.   bisoprolol 5 MG  tablet Commonly known as:  ZEBETA Take 1 tablet (5 mg total) by mouth daily.   budesonide-formoterol 160-4.5 MCG/ACT inhaler Commonly known as:  SYMBICORT Inhale 2 puffs into the lungs 2 (two) times daily.   buPROPion 150 MG 12 hr tablet Commonly known as:  WELLBUTRIN SR TAKE ONE TABLET BY MOUTH TWICE DAILY What changed:  See the new instructions.   citalopram 40 MG tablet Commonly known as:  CELEXA Take 1 tablet (40 mg total) by mouth daily.   digoxin 0.125 MG tablet Commonly known as:  LANOXIN Take 1 tablet (0.125 mg total) by mouth daily.   fluticasone 50 MCG/ACT nasal spray Commonly known as:  FLONASE Place 2 sprays into both nostrils daily.   gabapentin 100 MG capsule Commonly known as:  NEURONTIN Take two capsules three times daily What changed:  how much to take  how to take this  when to take this  additional instructions   guaiFENesin 600 MG 12 hr tablet Commonly known as:  MUCINEX Take 2 tablets (1,200 mg total) by mouth 2 (two) times daily.   hyoscyamine 0.125 MG SL tablet Commonly known as:  LEVSIN SL 1-2 tabs po q6h prn diarrhea What changed:  how much to take  how to take this  when to take this  reasons to take this  additional instructions   Insulin Glargine 300 UNIT/ML Sopn Commonly known as:  TOUJEO SOLOSTAR Inject 70 Units into the skin daily.   insulin lispro 100 UNIT/ML KiwkPen Commonly known as:  HUMALOG KWIKPEN Inject 18 units at breakfast 20 units at lunch and 26 units at dinner What changed:  how much to take  how to take this  when to take this  additional instructions   isosorbide mononitrate 30 MG 24 hr tablet Commonly known as:  IMDUR TAKE ONE TABLET BY MOUTH DAILY What changed:  See the new instructions.   losartan 25 MG tablet Commonly known as:  COZAAR Take 0.5 tablets (12.5 mg total) by mouth daily.   multivitamin with minerals Tabs tablet Take 1 tablet by mouth daily.   nitroGLYCERIN 0.4 MG SL  tablet Commonly known as:  NITROSTAT Place 1 tablet (0.4 mg total) under the tongue every 5 (five) minutes as needed for chest pain.   potassium chloride SA 20 MEQ tablet Commonly known as:  K-DUR,KLOR-CON Take 1 tablet (20 mEq total) by mouth daily.   PROAIR HFA 108 (90 Base) MCG/ACT inhaler Generic drug:  albuterol INHALE 2 PUFFS INTO THE LUNGS EVERY 4 HOURS AS NEEDED FOR WHEEZING ORSHORTNESS OF BREATH   rivaroxaban 20 MG Tabs tablet Commonly known as:  XARELTO Take 1 tablet (20 mg total) by mouth daily with supper.   spironolactone 25 MG tablet Commonly known as:  ALDACTONE Take 1 tablet (25 mg total) by mouth daily.   SYNJARDY 05-998 MG Tabs Generic drug:  Empagliflozin-Metformin HCl TAKE ONE TABLET BY MOUTH TWICE DAILY AFTER A MEAL   tamsulosin 0.4 MG Caps capsule Commonly known as:  FLOMAX TAKE 1 CAPSULE BY MOUTH EVERY DAY AFTER SUPPER What changed:  See the new instructions.   torsemide 20 MG tablet Commonly known as:  DEMADEX Take 2 tablets (40 mg total) by mouth daily.   vitamin C 1000 MG tablet  Take 1,000 mg by mouth daily.   Vitamin D-3 1000 units Caps Take 1,000 Units by mouth daily. Reported on 02/26/2015            Discharge Care Instructions        Start     Ordered   08/29/16 0000  losartan (COZAAR) 12.5  MG tablet  Daily     08/28/16 1055     Follow-up Information    Larey Dresser, MD Follow up on 10/02/2016.   Specialty:  Cardiology Why:  3pm Contact information: [address] [place] [address] [phone]        Tammi Sou, MD. Schedule an appointment as soon as possible for a visit in 1 week(s).   Specialty:  Family Medicine Contact information: [address] [place] [address] [phone]          Allergies  Allergen Reactions  . Morphine And Related Other (See Comments)    Drenched with perspiration  . Entresto [Sacubitril-Valsartan]     hypotension  . Demerol [Meperidine]  Nausea Only  . Starlix [Nateglinide] Other (See Comments)    gassy     Procedures/Studies: Dg Chest 2 View  Result Date: 08/27/2016 CLINICAL DATA:  Hypotension. EXAM: CHEST  2 VIEW COMPARISON:  06/16/2016 FINDINGS: Subtle nodularity right upper lobe is similar to prior. No evidence for focal airspace consolidation, airspace pulmonary edema, or pleural effusion. Lungs are hyperexpanded. Patient is status post CABG. The visualized bony structures of the thorax are intact. Telemetry leads overlie the chest. IMPRESSION: Stable exam. Subtle nodularity right upper lung, better evaluated on previous CT scan of 06/02/2016. No pulmonary edema or pleural effusion. Electronically Signed   By: Misty Stanley M.D.   On: 08/27/2016 18:02     Subjective: Stable on telemetry overnight. No further dizziness or near syncopal episode.  Discharge Exam: Vitals:   08/28/16 0851 08/28/16 1017  BP: (!) 100/50   Pulse: 69 71  Resp: 16   Temp: 98.4 F (36.9 C)   SpO2: 95%    Vitals:   08/28/16 0500 08/28/16 0537 08/28/16 0851 08/28/16 1017  BP:  (!) 159/60 (!) 100/50   Pulse: 64 67 69 71  Resp: _0 Temp:   98.4 F (36.9 C)   TempSrc:   Oral   SpO2:  97% 95%   Weight:      Height:        General: Elderly male not in distress HEENT: No pallor, moist mucosa, supple neck chest: Clear to auscultation bilaterally  CVS: Normal S1 and S2, no murmurs GI: Soft, nondistended, nontender Musculoskeletal: Warm, no edema     The results of significant diagnostics from this hospitalization (including imaging, microbiology, ancillary and laboratory) are listed below for reference.     Microbiology: Recent Results (from the past 240 hour(s))  MRSA PCR Screening     Status: None   Collection Time: 08/28/16  4:21 AM  Result Value Ref Range Status   MRSA by PCR NEGATIVE NEGATIVE Final    Comment:        The GeneXpert MRSA Assay (FDA approved for NASAL specimens only), is one component of  a comprehensive MRSA colonization surveillance program. It is not intended to diagnose MRSA infection nor to guide or monitor treatment for MRSA infections.      Labs: BNP (last 3 results)  Recent Labs  10/23/15 1831 06/04/16 1155 06/19/16 1138  BNP 47.7 462.5* 454.6*  Basic Metabolic Panel:  Recent Labs Lab 08/24/16 1053 08/27/16 1630 08/28/16 0015  NA 138 139 136  K 4.4 4.4 4.1  CL 104 102 100*  CO2 _0 GLUCOSE 282* 228* 296*  BUN 19 30* 31*  CREATININE 1.25* 1.42* 1.35*  CALCIUM 8.8* 9.8 9.4   Liver Function Tests:  Recent Labs Lab 08/27/16 1630  AST 24  ALT 22  ALKPHOS 81  BILITOT 0.5  PROT 6.3*  ALBUMIN 3.5   No results for input(s): LIPASE, AMYLASE in the last 168 hours. No results for input(s): AMMONIA in the last 168 hours. CBC:  Recent Labs Lab 08/24/16 1053 08/27/16 1630 08/28/16 0015  WBC 8.1 9.8 7.5  NEUTROABS  --  6.8  --   HGB 13.2 13.9 12.8*  HCT 42.1 44.6 43.1  MCV 83.5 83.1 84.2  PLT 177 200 175   Cardiac Enzymes: No results for input(s): CKTOTAL, CKMB, CKMBINDEX, TROPONINI in the last 168 hours. BNP: Invalid input(s): POCBNP CBG:  Recent Labs Lab 08/28/16 0048 08/28/16 0610  GLUCAP 298* 285*   D-Dimer No results for input(s): DDIMER in the last 72 hours. Hgb A1c No results for input(s): HGBA1C in the last 72 hours. Lipid Profile No results for input(s): CHOL, HDL, LDLCALC, TRIG, CHOLHDL, LDLDIRECT in the last 72 hours. Thyroid function studies  Recent Labs  08/28/16 0015  TSH 1.612   Anemia work up No results for input(s): VITAMINB12, FOLATE, FERRITIN, TIBC, IRON, RETICCTPCT in the last 72 hours. Urinalysis    Component Value Date/Time   COLORURINE STRAW (A) 08/27/2016 1732   APPEARANCEUR CLEAR 08/27/2016 1732   LABSPEC 1.007 08/27/2016 1732   PHURINE 5.0 08/27/2016 1732   GLUCOSEU >=500 (A) 08/27/2016 1732   HGBUR NEGATIVE 08/27/2016 1732   BILIRUBINUR NEGATIVE 08/27/2016 1732   KETONESUR  NEGATIVE 08/27/2016 1732   PROTEINUR NEGATIVE 08/27/2016 1732   UROBILINOGEN 0.2 08/11/2013 1624   NITRITE NEGATIVE 08/27/2016 1732   LEUKOCYTESUR NEGATIVE 08/27/2016 1732   Sepsis Labs Invalid input(s): PROCALCITONIN,  WBC,  LACTICIDVEN Microbiology Recent Results (from the past 240 hour(s))  MRSA PCR Screening     Status: None   Collection Time: 08/28/16  4:21 AM  Result Value Ref Range Status   MRSA by PCR NEGATIVE NEGATIVE Final    Comment:        The GeneXpert MRSA Assay (FDA approved for NASAL specimens only), is one component of a comprehensive MRSA colonization surveillance program. It is not intended to diagnose MRSA infection nor to guide or monitor treatment for MRSA infections.      Time coordinating discharge: <30 minutes  SIGNED:   Louellen Molder, MD  [facility] 08/28/2016, 10:56 AM Pager   If 7PM-7AM, please contact night-coverage [email or web address] Password [email or web address]

## 2016-08-31 ENCOUNTER — Telehealth: Payer: Self-pay | Admitting: *Deleted

## 2016-08-31 ENCOUNTER — Other Ambulatory Visit: Payer: Self-pay

## 2016-08-31 NOTE — Telephone Encounter (Signed)
Joshua [name] reports pt had low BP readings this morning, requesting PCP office contact patient.   Spoke with patients wife, reports blood pressure this morning was 94/41 (after taking BP meds), around 2:30pm, reading was 75/34. Patient denies dizziness, SOB or CP. Patient re-checked BP while on the phone, reading is 112/56. Patient is not due to take BP med again today, advised to check BP in the AM prior to taking anti-hypertensive. Wife verbalized understanding. Hosp f/u appt with PCP is Thursday, 09/03/16.

## 2016-08-31 NOTE — Telephone Encounter (Signed)
Noted.

## 2016-08-31 NOTE — Patient Outreach (Signed)
[facility]) Care Management  08/31/2016  Joshua [name] [birth year] [id number]   76 yr old with recent observation status 8/23-8/24 with hypotension in setting of starting new medication-Entresto. RNCM called for transition of care call.   Client reports blood pressure this morning was 94/41 pulse 62. RNCM asked client to take blood pressure now. Blood pressure equipment-wrist cuff. RNCM confirmed he was holding his wrist at heart level. Client states blood pressure now 75/34 pulse 57. Client denies dizziness today. Joshua [name] states client expressed feeling dizzy around 12 noon on yesterday.  8/25 BP 115/55   Weight 238 pounds 8/26 BP 111/46  Weight 232 pounds 8/27 BP 94/41 HR 62  Weight 234.2 pounds  RNCM called primary care. Discussed readings with both nurses Joshua [name] and Joshua [name]. RNCM questions client's blood pressure before taking medications this morning. Joshua [name] states she will call client for further instructions.   Plan: continue to follow. Pharmacy referral for medications greater than 10.   Joshua Silversmith, RN, MSN, [place] Coordinator Cell: [phone]

## 2016-08-31 NOTE — Telephone Encounter (Signed)
Denton Brick from [facility] called, call forwarded to [facility] for triage.

## 2016-09-01 ENCOUNTER — Other Ambulatory Visit: Payer: Self-pay

## 2016-09-01 NOTE — Patient Outreach (Signed)
[facility]) Care Management  09/01/2016  Joshua [name] 04-10-1940 [id number]   Subjective: " I feel fairly well". Denies dizziness, no faint feelings. Denies shortness of breath.  Objective: none-telephonic call  Assessment: 76 yr old with recent observation status 8/23-8/24 with hypotension in setting of starting new medication-Entresto. Client with reported low blood pressure on yesterday. RNCM called to follow up.  RNCM spoke with client who reports blood pressure this morning 124/56 pulse 56 before medications and weight today 232 pounds. Client has a scheduled appointment with primary care Thursday August 30th.  Plan: home visit next week.  Thea Silversmith, RN, MSN, [place] Coordinator Cell: [phone]

## 2016-09-03 ENCOUNTER — Encounter: Payer: Self-pay | Admitting: Family Medicine

## 2016-09-03 ENCOUNTER — Ambulatory Visit (INDEPENDENT_AMBULATORY_CARE_PROVIDER_SITE_OTHER): Payer: Medicare Other | Admitting: Family Medicine

## 2016-09-03 VITALS — BP 160/68 | HR 68 | Temp 97.6°F | Resp 16 | Wt 230.0 lb

## 2016-09-03 DIAGNOSIS — I480 Paroxysmal atrial fibrillation: Secondary | ICD-10-CM

## 2016-09-03 DIAGNOSIS — I2510 Atherosclerotic heart disease of native coronary artery without angina pectoris: Secondary | ICD-10-CM | POA: Diagnosis not present

## 2016-09-03 DIAGNOSIS — R7989 Other specified abnormal findings of blood chemistry: Secondary | ICD-10-CM

## 2016-09-03 DIAGNOSIS — I4891 Unspecified atrial fibrillation: Secondary | ICD-10-CM | POA: Diagnosis not present

## 2016-09-03 DIAGNOSIS — N181 Chronic kidney disease, stage 1: Secondary | ICD-10-CM | POA: Diagnosis not present

## 2016-09-03 DIAGNOSIS — F321 Major depressive disorder, single episode, moderate: Secondary | ICD-10-CM

## 2016-09-03 DIAGNOSIS — E1122 Type 2 diabetes mellitus with diabetic chronic kidney disease: Secondary | ICD-10-CM | POA: Diagnosis not present

## 2016-09-03 DIAGNOSIS — E118 Type 2 diabetes mellitus with unspecified complications: Secondary | ICD-10-CM

## 2016-09-03 DIAGNOSIS — Z794 Long term (current) use of insulin: Secondary | ICD-10-CM

## 2016-09-03 DIAGNOSIS — I5042 Chronic combined systolic (congestive) and diastolic (congestive) heart failure: Secondary | ICD-10-CM

## 2016-09-03 DIAGNOSIS — I9589 Other hypotension: Secondary | ICD-10-CM

## 2016-09-03 DIAGNOSIS — I959 Hypotension, unspecified: Secondary | ICD-10-CM

## 2016-09-03 DIAGNOSIS — J449 Chronic obstructive pulmonary disease, unspecified: Secondary | ICD-10-CM | POA: Diagnosis not present

## 2016-09-03 DIAGNOSIS — I130 Hypertensive heart and chronic kidney disease with heart failure and stage 1 through stage 4 chronic kidney disease, or unspecified chronic kidney disease: Secondary | ICD-10-CM | POA: Diagnosis not present

## 2016-09-03 DIAGNOSIS — I5041 Acute combined systolic (congestive) and diastolic (congestive) heart failure: Secondary | ICD-10-CM | POA: Diagnosis not present

## 2016-09-03 NOTE — Patient Instructions (Signed)
Take 1/2 of your spironolactone 28m tab once a day.  Continue to monitor bp once every morning and once every evening.  Take 1/2 of your 486mcitalopram once a day for the next week. Take ONE bupropion tab once a day for the next week.

## 2016-09-03 NOTE — Progress Notes (Signed)
09/03/2016  CC:  Chief Complaint  Patient presents with  . Hospitalization Follow-up    Patient is a 76 y.o. Caucasian male who presents for  hospital follow up, specifically [facility] face-to-face visit. Dates hospitalized: 8/23-8/24, 2018.   Days since d/c from hospital:  6 d Patient was discharged from hospital to home. Reason for admission to hospital: symptomatic hypotension after starting entresto. Date of interactive (phone) contact with patient and/or caregiver: 08/31/16  I have reviewed patient's discharge summary plus pertinent specific notes, labs, and imaging from the hospitalization.  Entresto stopped in hospital, and pt d/c'd home on 1/2 of 15m losartan qd.   BP's since going home: "mostly low" per wife.  911Dsystolic for the most part.  Pt denies dizziness and no change in chronic low level of energy when bp is like this.  Next f/u with cardiologist is in about 3 weeks. Pt denies any worsening of his DOE, denies CP, denies any signif LE edema, denies palpitations.  Feeling better from a physical standpoint.  However, from a emotional/mental standpoint he is not doing well--has felt this way for about the last 2-3 months, ever since his general health has declined. Feels agoraphobic, feels anxious with lots of pressure.  Feels sad and depressed persistently.  Depression> anxiety.  No panic attacks.  Anhedonia.  Avoiding going out around people.  No change in these sx's on increased dose of citalopram (40 mg ) since starting this 17 d/a. Continues to take wellbutrin as well.  He is not sure the wellbutrin has helped with depression but it did help him quit smoking.  Has never been on a benzo.  Citalopram and wellbutrin are the only antidepressants he has been on.  Denies SI or HI.  No hallucinations or delusions.    Medication reconciliation was done today and patient is taking meds as recommended by discharging hospitalist/specialist.    PMH:  Past Medical  History:  Diagnosis Date  . Acute respiratory distress 06/04/2016  . Adenomatous colon polyp 10/16/2011   Repeat 2018  . Arthritis     Hips, R>L & KNEES  . CAD, multiple vessel    3V CAD cath 08/08/13----CABG done shortly after.  . 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 pylori neg, no dysplasia or malignancy.  . Chronic diastolic heart failure ([facility] 2016  . Chronic renal insufficiency, stage 3 (moderate) 2018   GFR 50s  . COPD (chronic obstructive pulmonary disease) ([facility]    GOLD II.  Spirometry  2004 borderline obstruction; 2015 mod obst: noncompliant with bid symbicort so pulmonologist switched him to a once daily inhaler: Trelegy ellipta 12/2015.  Oxygen prn: goal 88-92%.  . Diabetic nephropathy ([facility]    Elevated urine microalb/cr 03/2011  . DM type 2 (diabetes mellitus, type 2) ([facility]    Poor control on max oral meds--pt eventually agreed to insulin therapy.  As of 2017 his DM is being managed by Dr. KDwyane Deein endo.  . DOE (dyspnea on exertion)    COPD + chronic diastolic HF  . Erectile dysfunction    Normal testosterone  . Hearing loss of both ears 2016   Hearing aids  . Hyperlipidemia   . Hyperplastic colon polyp 2001  . Hypertension   . Iron deficiency anemia 2014   03/2012 capsule endoscopy showed 2 AVMs--likely responsible for his IDA--lifetime iron supp recommended + q682moBCs.  . Macular degeneration, dry    Mild, bilat (Optometrist, [place] [employer]  [facility] [place] in [place], [place])  . Obesity   . Open toe wound 11/2015   Wound care clinic appt made and then canceled when toe improved.  . Other and unspecified angina pectoris   . PAD (peripheral artery disease) ([place]) 02/2016   Abnormal ABI's and waveforms: LE arterial duplex ordered for f/u as per cardiologist's recommendation.  Vasc eval by Dr. Bridgett Larsson: impression was minimal PAD, recommended maximize med mgmt.  [name] PAF (paroxysmal atrial fibrillation) ([place]) 10/2014   xarelto  .  Pericardial effusion with cardiac tamponade 6//29/15   pericardiocentesis was done,  Infectious/inflamm (cytology showed NO MALIGNANT CELLS)  . Pleural plaque    Pleural plaques/asbestosis changes on CT chest done by pulm 05/2016.  [name] Pneumonia   . Tobacco dependence in remission    100+ pack-yr hx: quit after CABG    PSH:  Past Surgical History:  Procedure Laterality Date  . APPENDECTOMY  1957  . CARDIAC CATHETERIZATION  08/08/2013  . CATARACT EXTRACTION W/ INTRAOCULAR LENS  IMPLANT, BILATERAL  04/08/2006 & 04/22/2006  . CATARACT EXTRACTION W/ INTRAOCULAR LENS  IMPLANT, BILATERAL Bilateral   . CHOLECYSTECTOMY OPEN  1999  . COLONOSCOPY  10/16/2011   Procedure: COLONOSCOPY;  Surgeon: Inda Castle, MD;  Location: [facility];  Service: Endoscopy;  Laterality: N/A;  . CORONARY ARTERY BYPASS GRAFT N/A 08/14/2013   Procedure: CORONARY ARTERY BYPASS GRAFTING (CABG);  Surgeon: Melrose Nakayama, MD;  Location: [place];  Service: Open Heart Surgery;  Laterality: N/A;  Times 4   using left internal mammary artery and endoscopically harvested bilateral saphenous vein  . ESOPHAGOGASTRODUODENOSCOPY  02/25/12   Atrophic gastritis with a few erosions--capsule endoscopy planned as of 02/25/12 (Dr. Deatra Ina).  [name] HARDWARE REMOVAL Right 08/12/2012   Procedure: HARDWARE REMOVAL;  Surgeon: Mcarthur Rossetti, MD;  Location: [facility];  Service: Orthopedics;  Laterality: Right;  . HIP SURGERY Right 1954   Repair of slipped capital femoral epiphysis.  . INTRAOPERATIVE TRANSESOPHAGEAL ECHOCARDIOGRAM N/A 08/14/2013   Normal LV function. Procedure: INTRAOPERATIVE TRANSESOPHAGEAL ECHOCARDIOGRAM;  Surgeon: Melrose Nakayama, MD;  Location: [facility];  Service: Open Heart Surgery;  Laterality: N/A;  . LEFT HEART CATHETERIZATION WITH CORONARY ANGIOGRAM N/A 08/08/2013   Procedure: LEFT HEART CATHETERIZATION WITH CORONARY ANGIOGRAM;  Surgeon: Jettie Booze, MD;  Location[facility] CATH LAB;  Service: Cardiovascular;  Laterality:  N/A;  . PATELLA FRACTURE SURGERY Left ~ 1979   bolt + 3 screws to repair tib plateau fx  . PERICARDIAL TAP N/A 07/01/2013   Procedure: PERICARDIAL TAP;  Surgeon: Jettie Booze, MD;  Location: [facility] CATH LAB;  Service: Cardiovascular;  Laterality: N/A;  . RIGHT/LEFT HEART CATH AND CORONARY ANGIOGRAPHY N/A 06/10/2016   Procedure: Right/Left Heart Cath and Coronary Angiography;  Surgeon: Larey Dresser, MD;  Location: [facility] [place] CV LAB;  Service: Cardiovascular;  Laterality: N/A;  . TESTICLE SURGERY  as a child   Undescended testicle brought down into scrotum  . TONSILLECTOMY  1947  . TOTAL HIP ARTHROPLASTY Right 08/12/2012   Procedure: REMOVAL OF OLD PINS RIGHT HIP AND RIGHT TOTAL HIP ARTHROPLASTY ANTERIOR APPROACH;  Surgeon: Mcarthur Rossetti, MD;  Location: [facility];  Service: Orthopedics;  Laterality: Right;  . TRANSTHORACIC ECHOCARDIOGRAM  10/30/14   Mod LVH, EF 60-65%, normal wall motion, mod mitral regurg, mild PAH    MEDS:  Outpatient Medications Prior to Visit  Medication Sig Dispense Refill  . ACCU-CHEK GUIDE test strip USE TO CHECK BLOOD SUGAR 3 TIMES DAILY 100 each 11  .  ACCU-CHEK GUIDE test strip USE TO CHECK BLOOD SUGAR 3 TIMES DAILY 100 each 11  . acetaminophen (TYLENOL) 325 MG tablet Take 2 tablets (650 mg total) by mouth every 4 (four) hours as needed for headache or mild pain.    . Ascorbic Acid (VITAMIN C) 1000 MG tablet Take 1,000 mg by mouth daily.    [name] aspirin 81 MG tablet Take 81 mg by mouth at bedtime. Reported on 02/26/2015    . atorvastatin (LIPITOR) 40 MG tablet TAKE ONE TABLET BY MOUTH EVERY DAY (Patient taking differently: TAKE 40MG BY MOUTH EVERY DAY) 30 tablet 6  . bisoprolol (ZEBETA) 5 MG tablet Take 1 tablet (5 mg total) by mouth daily. 30 tablet 6  . budesonide-formoterol (SYMBICORT) 160-4.5 MCG/ACT inhaler Inhale 2 puffs into the lungs 2 (two) times daily. 1 Inhaler 12  . buPROPion (WELLBUTRIN SR) 150 MG 12 hr tablet TAKE ONE TABLET BY MOUTH TWICE DAILY  (Patient taking differently: TAKE 150MG BY MOUTH TWICE DAILY) 60 tablet 5  . Cholecalciferol (VITAMIN D-3) 1000 units CAPS Take 1,000 Units by mouth daily. Reported on 02/26/2015    . citalopram (CELEXA) 40 MG tablet Take 1 tablet (40 mg total) by mouth daily. 30 tablet 3  . digoxin (LANOXIN) 0.125 MG tablet Take 1 tablet (0.125 mg total) by mouth daily. 30 tablet 6  . fluticasone (FLONASE) 50 MCG/ACT nasal spray Place 2 sprays into both nostrils daily. 16 g 1  . gabapentin (NEURONTIN) 100 MG capsule Take two capsules three times daily (Patient taking differently: Take 200 mg by mouth 3 (three) times daily. ) 180 capsule 6  . guaiFENesin (MUCINEX) 600 MG 12 hr tablet Take 2 tablets (1,200 mg total) by mouth 2 (two) times daily. 30 tablet 0  . hyoscyamine (LEVSIN SL) 0.125 MG SL tablet 1-2 tabs po q6h prn diarrhea (Patient taking differently: Take 0.125-0.25 mg by mouth every 6 (six) hours as needed (diarrhea). ) 30 tablet 1  . Insulin Glargine (TOUJEO SOLOSTAR) 300 UNIT/ML SOPN Inject 70 Units into the skin daily. 6 pen 1  . insulin regular (NOVOLIN R,HUMULIN R) 100 units/mL injection Inject 22-34 Units into the skin 3 (three) times daily before meals.    . isosorbide mononitrate (IMDUR) 30 MG 24 hr tablet TAKE ONE TABLET BY MOUTH DAILY (Patient taking differently: TAKE 30MG BY MOUTH DAILY) 90 tablet 1  . losartan (COZAAR) 25 MG tablet Take 0.5 tablets (12.5 mg total) by mouth daily. 30 tablet 0  . Multiple Vitamin (MULTIVITAMIN WITH MINERALS) TABS Take 1 tablet by mouth daily.    . nitroGLYCERIN (NITROSTAT) 0.4 MG SL tablet Place 1 tablet (0.4 mg total) under the tongue every 5 (five) minutes as needed for chest pain. 25 tablet 3  . potassium chloride SA (K-DUR,KLOR-CON) 20 MEQ tablet Take 1 tablet (20 mEq total) by mouth daily. 30 tablet 6  . PROAIR HFA 108 (90 Base) MCG/ACT inhaler INHALE 2 PUFFS INTO THE LUNGS EVERY 4 HOURS AS NEEDED FOR WHEEZING ORSHORTNESS OF BREATH 8.5 g 1  . rivaroxaban  (XARELTO) 20 MG TABS tablet Take 1 tablet (20 mg total) by mouth daily with supper. 90 tablet 3  . spironolactone (ALDACTONE) 25 MG tablet Take 1 tablet (25 mg total) by mouth daily. 30 tablet 6  . SYNJARDY 05-998 MG TABS TAKE ONE TABLET BY MOUTH TWICE DAILY AFTER A MEAL 60 tablet 2  . tamsulosin (FLOMAX) 0.4 MG CAPS capsule TAKE 1 CAPSULE BY MOUTH EVERY DAY AFTER SUPPER (Patient taking differently: TAKE  0.4MG BY MOUTH EVERY DAY AFTER SUPPER) 90 capsule 1  . torsemide (DEMADEX) 20 MG tablet Take 2 tablets (40 mg total) by mouth daily. 60 tablet 6  . insulin lispro (HUMALOG KWIKPEN) 100 UNIT/ML KiwkPen Inject 18 units at breakfast 20 units at lunch and 26 units at dinner (Patient not taking: Reported on 08/31/2016) 5 mL 4   No facility-administered medications prior to visit.    EXAM: BP (!) 160/68 (BP Location: Left Arm, Patient Position: Sitting, Cuff Size: Normal)   Pulse 68   Temp 97.6 F (36.4 C) (Oral)   Resp 16   Wt 230 lb (104.3 kg)   SpO2 94%   BMI 32.08 kg/m  Gen: alert, chronically ill appearing, NAD. AFFECT: pleasant, lucid thought and speech. CV; Irreg irreg, rate 50s, distant S1 and S2.  No rub. Chest is clear, no wheezing or rales. Normal symmetric air entry throughout both lung fields. No chest wall deformities or tenderness. EXT: no clubbing or cyanosis.  1+ bilat LL pitting edema.  Pertinent labs/imaging   Chemistry      Component Value Date/Time   NA 136 08/28/2016 0015   K 4.1 08/28/2016 0015   CL 100 (L) 08/28/2016 0015   CO2 22 08/28/2016 0015   BUN 31 (H) 08/28/2016 0015   CREATININE 1.35 (H) 08/28/2016 0015   CREATININE 1.05 08/10/2016 0956      Component Value Date/Time   CALCIUM 9.4 08/28/2016 0015   ALKPHOS 81 08/27/2016 1630   AST 24 08/27/2016 1630   ALT 22 08/27/2016 1630   BILITOT 0.5 08/27/2016 1630      ASSESSMENT/PLAN:  1) Symptomatic hypotension: secondary to entresto initiation.  Entresto d/c'd and pt now on 1/2 of 77m losartan. BPs  still too low, although fortunately he is not symptomatic.  Would like to see his systolic consistently > 1540 diastolic consistently >>08 Decided to decrease spironolactone dosing from a whole 252mtab to a 1/2 2585mab daily. Continue to monitor bp and wt's. At f/u in office in 1 week we'll review BPs, wt's, and recheck a BMET.  2) Chronic combined systolic and diastolic HF: cardiology titrating meds to try to help EF--running into hypotension issues.   Dr. McLAundra Dubinans on doing repeat echo 09/2016 and if EF remains low (25-30%), they plan on doing a CRT-D placement. Continue b-blocker, aldactone, ARB, digoxin, and torsemide.  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 No response to bupropion SR 150 bid. Plan is to ween down off both of these meds over the next 1 week. Instructions: Take 1/2 of your 9m54mtalopram once a day for the next week. Take ONE bupropion tab once a day for the next week. At f/u in 1 week we'll d/c both meds and start new antidepressant, most likely remeron 15mg34m) DM 2, hx of poor control. Mgmt as per Dr. KumarDwyane Deeocrinologist. He is on toujeo and was recently switched from humalog to novolin R with each meal (22-22-34).  5) PAF: stable rate control, anticoag with xarelto. Dig level 10 d/a 0.7.  6) Elevated creatinine: baseline GFR 80s, but in hosp recently it was 52 ml/min. Plan recheck BMET in 1 week after he's been on 1/2 dose spironolactone.  Medical decision making of high complexity was utilized today.  An After Visit Summary was printed and given to the patient.  FOLLOW UP:  1 week  Signed:  Phil Crissie Sickles  09/03/2016

## 2016-09-08 ENCOUNTER — Other Ambulatory Visit: Payer: Self-pay

## 2016-09-08 ENCOUNTER — Other Ambulatory Visit (HOSPITAL_COMMUNITY): Payer: Self-pay | Admitting: Cardiology

## 2016-09-08 ENCOUNTER — Other Ambulatory Visit: Payer: Self-pay | Admitting: Family Medicine

## 2016-09-08 ENCOUNTER — Ambulatory Visit: Payer: Self-pay

## 2016-09-08 MED ORDER — LOSARTAN POTASSIUM 25 MG PO TABS: 12.5000 mg | ORAL_TABLET | Freq: Every day | ORAL | 3 refills | Status: DC

## 2016-09-08 NOTE — Patient Outreach (Signed)
[facility]) Care Management  09/08/2016  Joshua [name] [birth year] [id number]   Medication Adherence call to [name] the reason for this call is because [name] is showing past due under [facility] Ins.on his Losartan 25 mg spoke to patient he said doctor [name] took him off  And put him on entresto for a few weeks but now doctor [name] wants him back on [place] they said since the doctor had cancel losartan and put him on entresto they need it a new prescription ,call doctors office spoke to  doctors nurse she  said  he needs to be on losartan 25 mg and she  will send in a new prescription to the pharmacy.   [facility] Direct Dial [phone]  Fax [phone] [name]

## 2016-09-09 ENCOUNTER — Ambulatory Visit (HOSPITAL_COMMUNITY)
Admission: RE | Admit: 2016-09-09 | Discharge: 2016-09-09 | Disposition: A | Discharge status: Home / Self Care | Payer: Medicare Other | Source: Ambulatory Visit | Attending: Internal Medicine | Admitting: Internal Medicine

## 2016-09-09 VITALS — BP 126/58 | HR 63 | Wt 230.2 lb

## 2016-09-09 DIAGNOSIS — I480 Paroxysmal atrial fibrillation: Secondary | ICD-10-CM | POA: Insufficient documentation

## 2016-09-09 DIAGNOSIS — I2510 Atherosclerotic heart disease of native coronary artery without angina pectoris: Secondary | ICD-10-CM | POA: Diagnosis not present

## 2016-09-09 DIAGNOSIS — Z77090 Contact with and (suspected) exposure to asbestos: Secondary | ICD-10-CM | POA: Insufficient documentation

## 2016-09-09 DIAGNOSIS — Z951 Presence of aortocoronary bypass graft: Secondary | ICD-10-CM | POA: Diagnosis not present

## 2016-09-09 DIAGNOSIS — J9611 Chronic respiratory failure with hypoxia: Secondary | ICD-10-CM | POA: Insufficient documentation

## 2016-09-09 DIAGNOSIS — E119 Type 2 diabetes mellitus without complications: Secondary | ICD-10-CM | POA: Diagnosis not present

## 2016-09-09 DIAGNOSIS — N179 Acute kidney failure, unspecified: Secondary | ICD-10-CM | POA: Diagnosis not present

## 2016-09-09 DIAGNOSIS — I110 Hypertensive heart disease with heart failure: Secondary | ICD-10-CM | POA: Diagnosis not present

## 2016-09-09 DIAGNOSIS — E785 Hyperlipidemia, unspecified: Secondary | ICD-10-CM | POA: Diagnosis not present

## 2016-09-09 DIAGNOSIS — J449 Chronic obstructive pulmonary disease, unspecified: Secondary | ICD-10-CM | POA: Insufficient documentation

## 2016-09-09 DIAGNOSIS — I5022 Chronic systolic (congestive) heart failure: Secondary | ICD-10-CM | POA: Insufficient documentation

## 2016-09-09 DIAGNOSIS — R55 Syncope and collapse: Secondary | ICD-10-CM | POA: Insufficient documentation

## 2016-09-09 LAB — BASIC METABOLIC PANEL
Anion gap: 13 (ref 5–15)
BUN: 25 mg/dL — AB (ref 6–20)
CALCIUM: 9.1 mg/dL (ref 8.9–10.3)
CHLORIDE: 101 mmol/L (ref 101–111)
CO2: 26 mmol/L (ref 22–32)
CREATININE: 1.44 mg/dL — AB (ref 0.61–1.24)
GFR, EST AFRICAN AMERICAN: 53 mL/min — AB (ref >60)
GFR, EST NON AFRICAN AMERICAN: 45 mL/min — AB (ref >60)
Glucose, Bld: 206 mg/dL — ABNORMAL HIGH (ref 65–99)
Potassium: 4.3 mmol/L (ref 3.5–5.1)
Sodium: 140 mmol/L (ref 135–145)

## 2016-09-09 NOTE — Progress Notes (Signed)
HF MD: [facility]  HPI:  Joshua [name] a 76 y.o.Caucasian malewith PMH of chronic systolic CHF (EF 09-81% on 06/07/16), COPD on home 02, COPD, history of asbestos exposure, HTN, HLD, and DM.   Admitted 5/31 ->06/11/16 with worsening SOB, volume overload, and increased 02 demand. Diuresed on IV lasix and given solumedrol. Thought to be mixed AECOPD and acute CHF. Moved to stepdown 06/05/16 with worsening resp status and BiPAP requirement. Improved with nebs, steroids, ABX and IV lasix + metolazone. Sent home on [place] and prednisone taper. Overall diuresed 22 lbs. Discharge weight 233 lbs.   Pt admitted from 6/11 ->06/17/16 with syncope (unwitnessed) Noted to have fall x 3. EKG with NSW with chronic LBBB. Creatinine mildly elevated. CT head negative for acute process. R hip xray negative for fracture or dislocated hardware. Felt to be vasovagal. Received NS and diuretics held up to discharge.   He presents today for pharmacist-led HF medication titration with his wife. At last HF clinic visit on 08/24/16, his losartan was switched to Entresto 24-26 mg BID but soon after he had a hypotensive episode into the 70s/50s and was hospitalized. Since then he was restarted on losartan 12.5 mg daily. His wife states that since his recent hospital discharge he has only been taking torsemide 20 mg daily instead of 40 mg daily because he felt less dizzy on the lower dose. Weights have been stable since this change was made and no increased SOB. Feeling good overall usually walking with his walker. Still having some dizzy spells but not frequent. BP usually in the low 90-100s. No further falls.    . Shortness of breath/dyspnea on exertion? no  . Orthopnea/PND? no . Edema? no . Lightheadedness/dizziness? Yes - less frequent since switch from Entresto back to losartan . Daily weights at home? Yes - 227.6 lb . Blood pressure/heart rate monitoring at home? Yes - 106/65 mmHg (usually 90-100s) . Following  low-sodium/fluid-restricted diet? no  HF Medications: Bisoprolol 5 mg PO daily Digoxin 0.125 mg PO daily Isosorbide mononitrate 30 mg PO daily KCl 20 mEq PO daily  Losartan 12.5 mg PO daily Spironolactone 25 mg PO daily Torsemide 20 mg PO daily   Has the patient been experiencing any side effects to the medications prescribed?  no  Does the patient have any problems obtaining medications due to transportation or finances?   Yes - now in donut hole, already enrolled in [employer] foundation and aware that it can be used for any HF medications that are >$25/mo; [employer] patient assistance approved for Xarelto  Understanding of regimen: good Understanding of indications: good Potential of compliance: good Patient understands to avoid NSAIDs. Patient understands to avoid decongestants.    Pertinent Lab Values: . 09/09/16: Serum creatinine 1.44 (BL 1.1-1.2), BUN 25, Potassium 4.3, Sodium 140 . 08/27/16: Digoxin 0.8   Vital Signs: . Weight: 230 lb (dry weight: 230 lb) . Blood pressure: 126/58 mmHg  . Heart rate: 63 bpm   Assessment: 1. Chronicsystolic CHF (EF 19-14% on 06/07/16), due to ICM. NYHA class IIIsymptoms.  - Volume status stable  - With soft BP at home and continued dizziness, will not make any mediation changes at this time  - Continue bisoprolol 5 mg daily, digoxin 0.125 mg daily, KCl 20 meq daily, spironolactone 25 mg daily, torsemide 20 mg daily and losartan 12.5 mg daily - Basic disease state pathophysiology, medication indication, mechanism and side effects reviewed at length with patient and he verbalized understanding 2. Chronic hypoxemic respiratory failure  -  Stable on home 02 3. CAD s/p CABG (cath 06/10/16 with severe 3V native disease with patent grafts. No interventional target)  - Continue ASA, statin, BB, spironolactone and new losartan  4. Paroxysmal A fib  - Regular by exam  - Continue Xarelto 20 mg daily. No bleeding.  5. Syncope (felt to be vasovagal. Given gentle  IVF and improved with diuretics held)  - No further episodes reported   - If recurs, would likely need to consider for ICD with low EF 6. Recent AKI  - SCr seems to be stable now ~1.2-1.4  Plan: 1) Medication changes: Based on clinical presentation, vital signs and recent labs will continue current regimen 2) Labs: BMET today 3) Follow-up: 09/22/16 with ECHO and visit with Dr. Mickel Crow K. Velva Harman, PharmD, BCPS, CPP Clinical Pharmacist Pager: [phone] Phone: [phone] 09/09/2016 2:19 PM

## 2016-09-09 NOTE — Patient Instructions (Addendum)
It was great to see you today!  Please continue all of your medications as prescribed.   Lab work today. We will call you with any abnormalities.  Please keep your appointment with Dr. [name] on 09/22/16.

## 2016-09-10 ENCOUNTER — Encounter: Payer: Self-pay | Admitting: Family Medicine

## 2016-09-10 ENCOUNTER — Other Ambulatory Visit: Payer: Self-pay

## 2016-09-10 ENCOUNTER — Ambulatory Visit (INDEPENDENT_AMBULATORY_CARE_PROVIDER_SITE_OTHER): Payer: Medicare Other | Admitting: Family Medicine

## 2016-09-10 VITALS — BP 116/50 | HR 60 | Temp 97.9°F | Resp 16 | Ht 71.0 in | Wt 229.5 lb

## 2016-09-10 DIAGNOSIS — I255 Ischemic cardiomyopathy: Secondary | ICD-10-CM | POA: Diagnosis not present

## 2016-09-10 DIAGNOSIS — I952 Hypotension due to drugs: Secondary | ICD-10-CM | POA: Diagnosis not present

## 2016-09-10 DIAGNOSIS — Z23 Encounter for immunization: Secondary | ICD-10-CM | POA: Diagnosis not present

## 2016-09-10 DIAGNOSIS — F329 Major depressive disorder, single episode, unspecified: Secondary | ICD-10-CM | POA: Diagnosis not present

## 2016-09-10 DIAGNOSIS — F4322 Adjustment disorder with anxiety: Secondary | ICD-10-CM

## 2016-09-10 MED ORDER — MIRTAZAPINE 15 MG PO TABS: 15.0000 mg | ORAL_TABLET | Freq: Every day | ORAL | 0 refills | Status: DC

## 2016-09-10 NOTE — Progress Notes (Signed)
OFFICE VISIT  09/10/2016   CC:  Chief Complaint  Patient presents with  . Follow-up    BP/CHF/Depression     HPI:    Patient is a 76 y.o. Caucasian male who presents accompanied by his wife for 1 week f/u blood pressure, CHF, and depression. Was seen yesterday at CHF clinic and was stable, still with "soft" bp so no med changes were made.  BMET showed stable renal function, hyperglycemia into 200s.  Last visit 1 week ago I recommended decreasing his aldactone to 1/2 of a 25 mg tab once daily due to ongoing low bp's.  However, he continued to take a whole 25 mg tab qd. Home bp monitoring: still 90s-low 100s, 40s-50s diast.  HR 60s. Wt is stable. FEELS at baseline regarding DOE.  No CP.  No dizziness.  No LE swelling.  Depression: weened him off his wellbutrin and his citalopram at last visit, planned on starting new antidepressant today.  Still taking 1/2 61m cital qd and 1 bupropion tab qd. No changes in depression and anxiety.  Wife noticing he is getting more irritable/shorter tempered, lack of patience since cutting back on his antidepressants.  NO HI or SI.  Still with a sense of humor.    Past Medical History:  Diagnosis Date  . Acute respiratory distress 06/04/2016  . Adenomatous colon polyp 10/16/2011   Repeat 2018  . Arthritis     Hips, R>L & KNEES  . CAD, multiple vessel    3V CAD cath 08/08/13----CABG done shortly after.  . 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 pylori neg, no dysplasia or malignancy.  . Chronic diastolic heart failure ([facility] 2016  . Chronic renal insufficiency, stage 3 (moderate) 2018   GFR 50s  . COPD (chronic obstructive pulmonary disease) ([facility]    GOLD II.  Spirometry  2004 borderline obstruction; 2015 mod obst: noncompliant with bid symbicort so pulmonologist switched him to a once daily inhaler: Trelegy ellipta 12/2015.  Oxygen prn: goal 88-92%.  . Diabetic nephropathy ([facility]    Elevated urine microalb/cr  03/2011  . DM type 2 (diabetes mellitus, type 2) ([facility] [place]    Poor control on max oral meds--pt eventually agreed to insulin therapy.  As of 2017 his DM is being managed by Dr. KDwyane Deein endo.  . DOE (dyspnea on exertion)    COPD + chronic diastolic HF  . Erectile dysfunction    Normal testosterone  . Hearing loss of both ears 2016   Hearing aids  . Hyperlipidemia   . Hyperplastic colon polyp 2001  . Hypertension   . Iron deficiency anemia 2014   03/2012 capsule endoscopy showed 2 AVMs--likely responsible for his IDA--lifetime iron supp recommended + q644moBCs.  . Ischemic cardiomyopathy 2018   06/2016: EF 25-30%, global hypokinesis, grd II DD, vent septum motion changes c/w LBBB, mod MV regurg, L atr mod/sev dilat, R atr mod dilat, mod incr pulm press, Grd II DD.  . Macular degeneration, dry    Mild, bilat (Optometrist, [place] [employer] [place] . Obesity   . Open toe wound 11/2015   Wound care clinic appt made and then canceled when toe improved.  . Other and unspecified angina pectoris   . PAD (peripheral artery disease) ([facility]/2018   Abnormal ABI's and waveforms: LE arterial duplex ordered for f/u as per cardiologist's recommendation.  Vasc eval by Dr. ChBridgett Larssonimpression was minimal PAD, recommended maximize med mgmt.  .[name]  PAF (paroxysmal atrial fibrillation) ([place]) 10/2014   xarelto  . Pericardial effusion with cardiac tamponade 6//29/15   pericardiocentesis was done,  Infectious/inflamm (cytology showed NO MALIGNANT CELLS)  . Pleural plaque    Pleural plaques/asbestosis changes on CT chest done by pulm 05/2016.  [name] Pneumonia   . Tobacco dependence in remission    100+ pack-yr hx: quit after CABG    Past Surgical History:  Procedure Laterality Date  . APPENDECTOMY  1957  . CARDIAC CATHETERIZATION  08/08/2013  . CATARACT EXTRACTION W/ INTRAOCULAR LENS  IMPLANT, BILATERAL  04/08/2006 & 04/22/2006  . CATARACT EXTRACTION W/ INTRAOCULAR LENS  IMPLANT, BILATERAL  Bilateral   . CHOLECYSTECTOMY OPEN  1999  . COLONOSCOPY  10/16/2011   Procedure: COLONOSCOPY;  Surgeon: Inda Castle, MD;  Location: [facility];  Service: Endoscopy;  Laterality: N/A;  . CORONARY ARTERY BYPASS GRAFT N/A 08/14/2013   Procedure: CORONARY ARTERY BYPASS GRAFTING (CABG);  Surgeon: Melrose Nakayama, MD;  Location: [facility];  Service: Open Heart Surgery;  Laterality: N/A;  Times 4   using left internal mammary artery and endoscopically harvested bilateral saphenous vein  . ESOPHAGOGASTRODUODENOSCOPY  02/25/12   Atrophic gastritis with a few erosions--capsule endoscopy planned as of 02/25/12 (Dr. Deatra Ina).  [name] HARDWARE REMOVAL Right 08/12/2012   Procedure: HARDWARE REMOVAL;  Surgeon: Mcarthur Rossetti, MD;  Location: [facility];  Service: Orthopedics;  Laterality: Right;  . HIP SURGERY Right 1954   Repair of slipped capital femoral epiphysis.  . INTRAOPERATIVE TRANSESOPHAGEAL ECHOCARDIOGRAM N/A 08/14/2013   Normal LV function. Procedure: INTRAOPERATIVE TRANSESOPHAGEAL ECHOCARDIOGRAM;  Surgeon: Melrose Nakayama, MD;  Location: [facility];  Service: Open Heart Surgery;  Laterality: N/A;  . LEFT HEART CATHETERIZATION WITH CORONARY ANGIOGRAM N/A 08/08/2013   Procedure: LEFT HEART CATHETERIZATION WITH CORONARY ANGIOGRAM;  Surgeon: Jettie Booze, MD;  Location: [facility] CATH LAB;  Service: Cardiovascular;  Laterality: N/A;  . PATELLA FRACTURE SURGERY Left ~ 1979   bolt + 3 screws to repair tib plateau fx  . PERICARDIAL TAP N/A 07/01/2013   Procedure: PERICARDIAL TAP;  Surgeon: Jettie Booze, MD;  Location: [facility] CATH LAB;  Service: Cardiovascular;  Laterality: N/A;  . RIGHT/LEFT HEART CATH AND CORONARY ANGIOGRAPHY N/A 06/10/2016   Procedure: Right/Left Heart Cath and Coronary Angiography;  Surgeon: Larey Dresser, MD;  Location: [facility] CV LAB;  Service: Cardiovascular;  Laterality: N/A;  . TESTICLE SURGERY  as a child   Undescended testicle brought down into scrotum  . TONSILLECTOMY   1947  . TOTAL HIP ARTHROPLASTY Right 08/12/2012   Procedure: REMOVAL OF OLD PINS RIGHT HIP AND RIGHT TOTAL HIP ARTHROPLASTY ANTERIOR APPROACH;  Surgeon: Mcarthur Rossetti, MD;  Location: [facility];  Service: Orthopedics;  Laterality: Right;  . TRANSTHORACIC ECHOCARDIOGRAM  10/30/14   Mod LVH, EF 60-65%, normal wall motion, mod mitral regurg, mild PAH  . TRANSTHORACIC ECHOCARDIOGRAM  06/2016   06/2016: EF 25-30%, global hypokinesis, grd II DD, vent septum motion changes c/w LBBB, mod MV regurg, L atr mod/sev dilat, R atr mod dilat, mod incr pulm press, Grd II DD.    Outpatient Medications Prior to Visit  Medication Sig Dispense Refill  . ACCU-CHEK GUIDE test strip USE TO CHECK BLOOD SUGAR 3 TIMES DAILY 100 each 11  . ACCU-CHEK GUIDE test strip USE TO CHECK BLOOD SUGAR 3 TIMES DAILY 100 each 11  . acetaminophen (TYLENOL) 325 MG tablet Take 2 tablets (650 mg total) by mouth every 4 (four) hours as needed for headache  or mild pain.    . Ascorbic Acid (VITAMIN C) 1000 MG tablet Take 1,000 mg by mouth daily.    [name] aspirin 81 MG tablet Take 81 mg by mouth at bedtime. Reported on 02/26/2015    . atorvastatin (LIPITOR) 40 MG tablet TAKE ONE TABLET BY MOUTH EVERY DAY 30 tablet 6  . bisoprolol (ZEBETA) 5 MG tablet Take 1 tablet (5 mg total) by mouth daily. 30 tablet 6  . budesonide-formoterol (SYMBICORT) 160-4.5 MCG/ACT inhaler Inhale 2 puffs into the lungs 2 (two) times daily. 1 Inhaler 12  . Cholecalciferol (VITAMIN D-3) 1000 units CAPS Take 1,000 Units by mouth daily. Reported on 02/26/2015    . digoxin (LANOXIN) 0.125 MG tablet Take 1 tablet (0.125 mg total) by mouth daily. 30 tablet 6  . fluticasone (FLONASE) 50 MCG/ACT nasal spray Place 2 sprays into both nostrils daily. 16 g 1  . gabapentin (NEURONTIN) 100 MG capsule Take 200 mg by mouth 3 (three) times daily.    [name] guaiFENesin (MUCINEX) 600 MG 12 hr tablet Take 2 tablets (1,200 mg total) by mouth 2 (two) times daily. 30 tablet 0  . hyoscyamine  (LEVSIN SL) 0.125 MG SL tablet 1-2 tabs po q6h prn diarrhea 30 tablet 1  . Insulin Glargine (TOUJEO MAX SOLOSTAR) 300 UNIT/ML SOPN Inject 50 Units into the skin 2 (two) times daily.    . insulin regular (NOVOLIN R,HUMULIN R) 100 units/mL injection Inject 22-34 Units into the skin 3 (three) times daily before meals.    . isosorbide mononitrate (IMDUR) 30 MG 24 hr tablet TAKE ONE TABLET BY MOUTH EVERY DAY 90 tablet 1  . losartan (COZAAR) 25 MG tablet Take 0.5 tablets (12.5 mg total) by mouth daily. 45 tablet 3  . Multiple Vitamin (MULTIVITAMIN WITH MINERALS) TABS Take 1 tablet by mouth daily.    . nitroGLYCERIN (NITROSTAT) 0.4 MG SL tablet Place 1 tablet (0.4 mg total) under the tongue every 5 (five) minutes as needed for chest pain. 25 tablet 3  . potassium chloride SA (K-DUR,KLOR-CON) 20 MEQ tablet Take 1 tablet (20 mEq total) by mouth daily. 30 tablet 6  . PROAIR HFA 108 (90 Base) MCG/ACT inhaler INHALE 2 PUFFS INTO THE LUNGS EVERY 4 HOURS AS NEEDED FOR WHEEZING ORSHORTNESS OF BREATH 8.5 g 1  . rivaroxaban (XARELTO) 20 MG TABS tablet Take 1 tablet (20 mg total) by mouth daily with supper. 90 tablet 3  . spironolactone (ALDACTONE) 25 MG tablet Take 1 tablet (25 mg total) by mouth daily. 30 tablet 6  . SYNJARDY 05-998 MG TABS TAKE ONE TABLET BY MOUTH TWICE DAILY AFTER A MEAL 60 tablet 2  . tamsulosin (FLOMAX) 0.4 MG CAPS capsule TAKE 1 CAPSULE BY MOUTH EVERY DAY AFTER SUPPER 90 capsule 1  . torsemide (DEMADEX) 20 MG tablet Take 20 mg by mouth daily.    [name] buPROPion (WELLBUTRIN SR) 150 MG 12 hr tablet Take 150 mg by mouth daily.    . citalopram (CELEXA) 20 MG tablet Take 20 mg by mouth daily.     No facility-administered medications prior to visit.     Allergies  Allergen Reactions  . Morphine And Related Other (See Comments)    Drenched with perspiration  . Entresto [Sacubitril-Valsartan]     hypotension  . Demerol [Meperidine] Nausea Only  . Starlix [Nateglinide] Other (See Comments)     gassy    ROS As per HPI  PE: Blood pressure (!) 116/50, pulse 60, temperature 97.9 F (36.6 C), temperature source Oral,  resp. rate 16, height _0  (1.803 m), weight 229 lb 8 oz (104.1 kg), SpO2 94 %.on RA. Gen: Alert, well appearing.  Patient is oriented to person, place, time, and situation. AFFECT: pleasant, lucid thought and speech. CV: RRR, no m/r/g.  S1 and S2 distant. LUNGS: CTA bilat, nonlabored resps, mildly diminished aeration in all lung fields. EXT: no clubbing or cyanosis, trace R LL pitting edema, 1+ L LL pitting edema.   LABS:    Chemistry      Component Value Date/Time   NA 140 09/09/2016 1500   K 4.3 09/09/2016 1500   CL 101 09/09/2016 1500   CO2 26 09/09/2016 1500   BUN 25 (H) 09/09/2016 1500   CREATININE 1.44 (H) 09/09/2016 1500   CREATININE 1.05 08/10/2016 0956      Component Value Date/Time   CALCIUM 9.1 09/09/2016 1500   ALKPHOS 81 08/27/2016 1630   AST 24 08/27/2016 1630   ALT 22 08/27/2016 1630   BILITOT 0.5 08/27/2016 1630      IMPRESSION AND PLAN:  1) Low blood pressure: no longer symptomatic.   This is due to meds her is taking for his CHF.  No changes in med regimen at this time.  2) Combined systolic/diastolic CHF: class III sx's, EF 25-30% on echo 06/2016. No sign of volume overload.  Reiterated low Na diet. For f/u in CHF clinic with Dr. [name] + repeat echo 09/22/16.  3) MDD, not recurrent, mild/mod, w/out psychotic features. Also with significant anxiety component. May stop bupropion and citalopram after today's doses. Tomorrow start remeron 20m qhs.  Therapeutic expectations and side effect profile of medication discussed today.  Patient's questions answered. He was agreeable to counseling so I placed referral order to Dr. MGaynell Facetoday.  4) preventative health care: Flu shot given today.  An After Visit Summary was printed and given to the patient.  FOLLOW UP: Return in about 4 weeks (around 10/08/2016) for f/u  depression/anxiety.  Signed:  PCrissie Sickles MD           09/10/2016

## 2016-09-10 NOTE — Patient Outreach (Signed)
[name]) Care Management   09/10/2016  Joshua [name] 1940/02/07 [id number]  Joshua [name] is an 76 y.o. male  Subjective: "I am doing good."  Objective:  BP (!) 102/50   Pulse 61   Resp 20   Ht 1.816 m (5' 11.5")   Wt 228 lb 3.2 oz (103.5 kg)   SpO2 94%   BMI 31.38 kg/m   Review of Systems  Respiratory: Negative for cough and shortness of breath.        Lungs sounds clear.  Cardiovascular: Negative for chest pain and leg swelling.    Physical Exam  Encounter Medications:   Outpatient Encounter Prescriptions as of 09/10/2016  Medication Sig Note  . ACCU-CHEK GUIDE test strip USE TO CHECK BLOOD SUGAR 3 TIMES DAILY   . ACCU-CHEK GUIDE test strip USE TO CHECK BLOOD SUGAR 3 TIMES DAILY   . acetaminophen (TYLENOL) 325 MG tablet Take 2 tablets (650 mg total) by mouth every 4 (four) hours as needed for headache or mild pain.   . Ascorbic Acid (VITAMIN C) 1000 MG tablet Take 1,000 mg by mouth daily.   [name] aspirin 81 MG tablet Take 81 mg by mouth at bedtime. Reported on 02/26/2015   . atorvastatin (LIPITOR) 40 MG tablet TAKE ONE TABLET BY MOUTH EVERY DAY   . bisoprolol (ZEBETA) 5 MG tablet Take 1 tablet (5 mg total) by mouth daily.   . budesonide-formoterol (SYMBICORT) 160-4.5 MCG/ACT inhaler Inhale 2 puffs into the lungs 2 (two) times daily.   [name] buPROPion (WELLBUTRIN SR) 150 MG 12 hr tablet Take 150 mg by mouth daily.   . Cholecalciferol (VITAMIN D-3) 1000 units CAPS Take 1,000 Units by mouth daily. Reported on 02/26/2015   . citalopram (CELEXA) 20 MG tablet Take 20 mg by mouth daily.   . digoxin (LANOXIN) 0.125 MG tablet Take 1 tablet (0.125 mg total) by mouth daily.   . fluticasone (FLONASE) 50 MCG/ACT nasal spray Place 2 sprays into both nostrils daily.   [name] gabapentin (NEURONTIN) 100 MG capsule Take 200 mg by mouth 3 (three) times daily. 09/10/2016: Per wife, usually takes two tablets twice a day.  [name] guaiFENesin (MUCINEX) 600 MG 12 hr tablet Take 2 tablets (1,200 mg  total) by mouth 2 (two) times daily.   . hyoscyamine (LEVSIN SL) 0.125 MG SL tablet 1-2 tabs po q6h prn diarrhea   . Insulin Glargine (TOUJEO MAX SOLOSTAR) 300 UNIT/ML SOPN Inject 50 Units into the skin 2 (two) times daily.   . insulin regular (NOVOLIN R,HUMULIN R) 100 units/mL injection Inject 22-34 Units into the skin 3 (three) times daily before meals. 08/31/2016: Taking 22 units in am, 22 units at lunch and 34 units at dinner.  . isosorbide mononitrate (IMDUR) 30 MG 24 hr tablet TAKE ONE TABLET BY MOUTH EVERY DAY   . losartan (COZAAR) 25 MG tablet Take 0.5 tablets (12.5 mg total) by mouth daily.   . Multiple Vitamin (MULTIVITAMIN WITH MINERALS) TABS Take 1 tablet by mouth daily.   . nitroGLYCERIN (NITROSTAT) 0.4 MG SL tablet Place 1 tablet (0.4 mg total) under the tongue every 5 (five) minutes as needed for chest pain.   . potassium chloride SA (K-DUR,KLOR-CON) 20 MEQ tablet Take 1 tablet (20 mEq total) by mouth daily.   [name] PROAIR HFA 108 (90 Base) MCG/ACT inhaler INHALE 2 PUFFS INTO THE LUNGS EVERY 4 HOURS AS NEEDED FOR WHEEZING ORSHORTNESS OF BREATH   . rivaroxaban (XARELTO) 20 MG TABS tablet Take 1 tablet (  20 mg total) by mouth daily with supper.   [name] spironolactone (ALDACTONE) 25 MG tablet Take 1 tablet (25 mg total) by mouth daily.   [name] SYNJARDY 05-998 MG TABS TAKE ONE TABLET BY MOUTH TWICE DAILY AFTER A MEAL   . tamsulosin (FLOMAX) 0.4 MG CAPS capsule TAKE 1 CAPSULE BY MOUTH EVERY DAY AFTER SUPPER   . torsemide (DEMADEX) 20 MG tablet Take 20 mg by mouth daily.    No facility-administered encounter medications on file as of 09/10/2016.     Functional Status:   In your present state of health, do you have any difficulty performing the following activities: 09/10/2016 08/28/2016  Hearing? - Y  Vision? - N  Difficulty concentrating or making decisions? - N  Walking or climbing stairs? - Y  Dressing or bathing? - N  Doing errands, shopping? - N  Conservation officer, nature and eating ? Y -  Using the  Toilet? N -  In the past six months, have you accidently leaked urine? Y -  Comment - -  Do you have problems with loss of bowel control? Y -  Comment if eat too many grapes or raisons. -  Managing your Medications? Y -  Comment wife assist with medication management. -  Managing your Finances? N -  Housekeeping or managing your Housekeeping? N -  Some recent data might be hidden    Fall/Depression Screening:    Fall Risk  09/10/2016 07/16/2016 06/22/2016  Falls in the past year? No Yes Yes  Number falls in past yr: - 2 or more 2 or more  Injury with Fall? - No Yes  Risk Factor Category  - High Fall Risk -  Risk for fall due to : - History of fall(s);Impaired balance/gait History of fall(s);Impaired balance/gait  Risk for fall due to: Comment - - -  Follow up - Falls prevention discussed Education provided;Falls prevention discussed   PHQ 2/9 Scores 09/10/2016 07/01/2016 06/04/2016 05/18/2016 01/09/2016 10/29/2015 09/26/2015  PHQ - 2 Score _0 0 2  PHQ- 9 Score 5 - 24 10 - - 4  Exception Documentation - - - - - - -    Assessment:  76 yr old with history of heart failure, weakness, Recent observation in [name] 8/23-8/24 with hypotension. RNCM completed home visit. Wife present at visit.   Re heart failure: Client denies any shortness of breath, no significant lower extremity edema noted. Client states he is weighing self daily. Weight today 228.2 pounds. RNCM reinforced daily weights and when to call for assistance.   Hypotension-client is monitoring blood pressure and recording readings. Per client today his blood pressure was 90/46 pulse 61. RNCM's took manual reading today - 102/50. Client denies dizziness, denies feeling faint. Client reports he has an appointment with primary care this afternoon.  RNCM discussed transitioning to Health Coach for continued disease management regarding heart failure/hypotension. Client declines, but is receptive to a telephonic call next  month.  Medications reviewed. Client's wife manages medication-Without questions/concerns.   Plan: telephonic follow up next month to assess for additional care management needs.  Thea Silversmith, RN, MSN, [place] Coordinator Cell: [phone]

## 2016-09-14 ENCOUNTER — Other Ambulatory Visit: Payer: Self-pay | Admitting: Pharmacist

## 2016-09-15 NOTE — Patient Outreach (Addendum)
LATE ENTRY FOR 09/14/16  [facility]) Care Management  [place]   09/15/2016  Joshua [name] 12/01/1940 [id number]  Subjective: Patient was called regarding medication management per referral from [place], Thea Silversmith. Patient's wife answered the phone and the patient was in the background. HIPAA identifiers were obtained.   Patient is a 76 year old male with multiple medical conditions including but not limited to: atrial fibrillation,PAD; HTN; hyperlipidemia, type 2 diabetes,  COPD; CKD stage 3; CAD; and chronic diastolic heart failure.  Patient was hospitalized 08/27/16 for hypotension.    Patient's wife manages the patient's medications and they did not report any issues with their medications or with medication affordability. Patient's wife said she did not have any medication questions.    Objective:   Encounter Medications: Outpatient Encounter Prescriptions as of 09/14/2016  Medication Sig Note  . ACCU-CHEK GUIDE test strip USE TO CHECK BLOOD SUGAR 3 TIMES DAILY   . acetaminophen (TYLENOL) 325 MG tablet Take 2 tablets (650 mg total) by mouth every 4 (four) hours as needed for headache or mild pain.   . Ascorbic Acid (VITAMIN C) 1000 MG tablet Take 1,000 mg by mouth daily.   [name] aspirin 81 MG tablet Take 81 mg by mouth at bedtime. Reported on 02/26/2015   . atorvastatin (LIPITOR) 40 MG tablet TAKE ONE TABLET BY MOUTH EVERY DAY (Patient taking differently: TAKE 1/2 tablet daily (27m))   . bisoprolol (ZEBETA) 5 MG tablet Take 1 tablet (5 mg total) by mouth daily.   . budesonide-formoterol (SYMBICORT) 160-4.5 MCG/ACT inhaler Inhale 2 puffs into the lungs 2 (two) times daily.   . Cholecalciferol (VITAMIN D-3) 1000 units CAPS Take 1,000 Units by mouth daily. Reported on 02/26/2015   . digoxin (LANOXIN) 0.125 MG tablet Take 1 tablet (0.125 mg total) by mouth daily.   . fluticasone (FLONASE) 50 MCG/ACT nasal spray Place 2 sprays into both nostrils  daily.   .Marland Kitchengabapentin (NEURONTIN) 100 MG capsule Take 200 mg by mouth 3 (three) times daily.   .Marland KitchenguaiFENesin (MUCINEX) 600 MG 12 hr tablet Take 2 tablets (1,200 mg total) by mouth 2 (two) times daily.   . hyoscyamine (LEVSIN SL) 0.125 MG SL tablet 1-2 tabs po q6h prn diarrhea   . Insulin Glargine (TOUJEO MAX SOLOSTAR) 300 UNIT/ML SOPN Inject 50 Units into the skin 2 (two) times daily.   . insulin regular (NOVOLIN R,HUMULIN R) 100 units/mL injection Inject 22-34 Units into the skin 3 (three) times daily before meals. 08/31/2016: Taking 22 units in am, 22 units at lunch and 34 units at dinner.  . isosorbide mononitrate (IMDUR) 30 MG 24 hr tablet TAKE ONE TABLET BY MOUTH EVERY DAY   . losartan (COZAAR) 25 MG tablet Take 0.5 tablets (12.5 mg total) by mouth daily.   . mirtazapine (REMERON) 15 MG tablet Take 1 tablet (15 mg total) by mouth at bedtime.   . Multiple Vitamin (MULTIVITAMIN WITH MINERALS) TABS Take 1 tablet by mouth daily.   . nitroGLYCERIN (NITROSTAT) 0.4 MG SL tablet Place 1 tablet (0.4 mg total) under the tongue every 5 (five) minutes as needed for chest pain.   . potassium chloride SA (K-DUR,KLOR-CON) 20 MEQ tablet Take 1 tablet (20 mEq total) by mouth daily.   .Marland KitchenPROAIR HFA 108 (90 Base) MCG/ACT inhaler INHALE 2 PUFFS INTO THE LUNGS EVERY 4 HOURS AS NEEDED FOR WHEEZING ORSHORTNESS OF BREATH   . rivaroxaban (XARELTO) 20 MG TABS tablet Take 1 tablet (20  mg total) by mouth daily with supper.   [name] spironolactone (ALDACTONE) 25 MG tablet Take 1 tablet (25 mg total) by mouth daily.   [name] SYNJARDY 05-998 MG TABS TAKE ONE TABLET BY MOUTH TWICE DAILY AFTER A MEAL   . tamsulosin (FLOMAX) 0.4 MG CAPS capsule TAKE 1 CAPSULE BY MOUTH EVERY DAY AFTER SUPPER   . torsemide (DEMADEX) 20 MG tablet Take 40 mg by mouth daily. Take 2 tablets daily   . [DISCONTINUED] ACCU-CHEK GUIDE test strip USE TO CHECK BLOOD SUGAR 3 TIMES DAILY   . [DISCONTINUED] buPROPion (WELLBUTRIN SR) 150 MG 12 hr tablet Take 1 tablet by  mouth 2 (two) times daily.    No facility-administered encounter medications on file as of 09/14/2016.     Functional Status: In your present state of health, do you have any difficulty performing the following activities: 09/10/2016 08/28/2016  Hearing? - Y  Vision? - N  Difficulty concentrating or making decisions? - N  Walking or climbing stairs? - Y  Dressing or bathing? - N  Doing errands, shopping? - N  Conservation officer, nature and eating ? Y -  Using the Toilet? N -  In the past six months, have you accidently leaked urine? Y -  Comment - -  Do you have problems with loss of bowel control? Y -  Comment if eat too many grapes or raisons. -  Managing your Medications? Y -  Comment wife assist with medication management. -  Managing your Finances? N -  Housekeeping or managing your Housekeeping? N -  Some recent data might be hidden    Fall/Depression Screening: Fall Risk  09/10/2016 07/16/2016 06/22/2016  Falls in the past year? No Yes Yes  Number falls in past yr: - 2 or more 2 or more  Injury with Fall? - No Yes  Risk Factor Category  - High Fall Risk -  Risk for fall due to : - History of fall(s);Impaired balance/gait History of fall(s);Impaired balance/gait  Risk for fall due to: Comment - - -  Follow up - Falls prevention discussed Education provided;Falls prevention discussed   PHQ 2/9 Scores 09/10/2016 09/10/2016 07/01/2016 06/04/2016 05/18/2016 01/09/2016 10/29/2015  PHQ - 2 Score _0 0  PHQ- 9 Score 20 5 - 24 10 - -  Exception Documentation - - - - - - -      Assessment: Patient's medications were reviewed while speaking with his wife via telephone:   Drugs sorted by system:  Neurologic/Psychologic: Gabapentin Mirtazapine Nitroglycerin  Cardiovascular: Aspirin Atorvastatin Bisoprolol Digoxin Isosorbide  Mononitrate Losartan Xarelto Spironolactone Torsemide   Pulmonary/Allergy: Budesonide/formoterol Fluticasone Mucinex ProAir   Gastrointestinal: Hyoscyamine SL  Endocrine: Toujeo Novolin R Synjardy  Pain: Acetaminophen  Vitamins/Minerals: Vitamin C Vitamin D Multiple Vitamin Potassium Chloride  Mag-2.3(06/15/16) Dig level 0.8 K-4.1 (08/28/16) CrCl-67 ml/min Scr 1.35   Entresto was discontinued and this has seemed to help with the patient's hypotensive issues.   Plan:  Close pharmacy case as patient and his wife do not have any medication concerns. The patient's wife has [facility] Pharmacist's contact information and communicated understanding about being able to reach out in the future about medication related concerns or questions.  [facility] Community Nurse, Thea Silversmith will be notified.   Elayne Guerin, PharmD, [place] Clinical Pharmacist [phone]

## 2016-09-22 ENCOUNTER — Ambulatory Visit (HOSPITAL_COMMUNITY)
Admission: RE | Admit: 2016-09-22 | Discharge: 2016-09-22 | Disposition: A | Discharge status: Home / Self Care | Payer: Medicare Other | Source: Ambulatory Visit | Attending: Cardiology | Admitting: Cardiology

## 2016-09-22 ENCOUNTER — Ambulatory Visit (HOSPITAL_BASED_OUTPATIENT_CLINIC_OR_DEPARTMENT_OTHER)
Admission: RE | Admit: 2016-09-22 | Discharge: 2016-09-22 | Disposition: A | Discharge status: Home / Self Care | Payer: Medicare Other | Source: Ambulatory Visit | Attending: Cardiology | Admitting: Cardiology

## 2016-09-22 ENCOUNTER — Encounter (HOSPITAL_COMMUNITY): Payer: Self-pay | Admitting: Cardiology

## 2016-09-22 VITALS — BP 135/54 | HR 90 | Wt 226.0 lb

## 2016-09-22 DIAGNOSIS — Z951 Presence of aortocoronary bypass graft: Secondary | ICD-10-CM | POA: Insufficient documentation

## 2016-09-22 DIAGNOSIS — I5022 Chronic systolic (congestive) heart failure: Secondary | ICD-10-CM | POA: Diagnosis not present

## 2016-09-22 DIAGNOSIS — J449 Chronic obstructive pulmonary disease, unspecified: Secondary | ICD-10-CM | POA: Diagnosis not present

## 2016-09-22 DIAGNOSIS — I480 Paroxysmal atrial fibrillation: Secondary | ICD-10-CM

## 2016-09-22 DIAGNOSIS — E785 Hyperlipidemia, unspecified: Secondary | ICD-10-CM | POA: Insufficient documentation

## 2016-09-22 DIAGNOSIS — Z77090 Contact with and (suspected) exposure to asbestos: Secondary | ICD-10-CM | POA: Diagnosis not present

## 2016-09-22 DIAGNOSIS — E119 Type 2 diabetes mellitus without complications: Secondary | ICD-10-CM | POA: Diagnosis not present

## 2016-09-22 DIAGNOSIS — Z79899 Other long term (current) drug therapy: Secondary | ICD-10-CM | POA: Diagnosis not present

## 2016-09-22 DIAGNOSIS — E669 Obesity, unspecified: Secondary | ICD-10-CM | POA: Diagnosis not present

## 2016-09-22 DIAGNOSIS — Z7901 Long term (current) use of anticoagulants: Secondary | ICD-10-CM | POA: Diagnosis not present

## 2016-09-22 DIAGNOSIS — Z794 Long term (current) use of insulin: Secondary | ICD-10-CM | POA: Diagnosis not present

## 2016-09-22 DIAGNOSIS — Z87891 Personal history of nicotine dependence: Secondary | ICD-10-CM | POA: Diagnosis not present

## 2016-09-22 DIAGNOSIS — R55 Syncope and collapse: Secondary | ICD-10-CM | POA: Diagnosis not present

## 2016-09-22 DIAGNOSIS — I447 Left bundle-branch block, unspecified: Secondary | ICD-10-CM | POA: Insufficient documentation

## 2016-09-22 DIAGNOSIS — I5032 Chronic diastolic (congestive) heart failure: Secondary | ICD-10-CM | POA: Diagnosis not present

## 2016-09-22 DIAGNOSIS — I110 Hypertensive heart disease with heart failure: Secondary | ICD-10-CM | POA: Insufficient documentation

## 2016-09-22 DIAGNOSIS — Z6832 Body mass index (BMI) 32.0-32.9, adult: Secondary | ICD-10-CM | POA: Diagnosis not present

## 2016-09-22 DIAGNOSIS — I2510 Atherosclerotic heart disease of native coronary artery without angina pectoris: Secondary | ICD-10-CM | POA: Diagnosis not present

## 2016-09-22 DIAGNOSIS — N181 Chronic kidney disease, stage 1: Secondary | ICD-10-CM | POA: Diagnosis not present

## 2016-09-22 HISTORY — PX: TRANSESOPHAGEAL ECHOCARDIOGRAM: SHX273

## 2016-09-22 MED ORDER — LOSARTAN POTASSIUM 25 MG PO TABS: 25.0000 mg | ORAL_TABLET | Freq: Every day | ORAL | 3 refills | Status: DC

## 2016-09-22 NOTE — Patient Instructions (Signed)
Stop Aspirin  Increase Losartan 25 mg daily  Your physician recommends that you return for lab work in: 10 days at PCP  Your physician recommends that you schedule a follow-up appointment in: 2 months

## 2016-09-22 NOTE — Progress Notes (Signed)
  Echocardiogram 2D Echocardiogram has been performed.  Joshua [name] 09/22/2016, 3:17 PM

## 2016-09-23 ENCOUNTER — Encounter: Payer: Self-pay | Admitting: Gastroenterology

## 2016-09-23 NOTE — Progress Notes (Signed)
Advanced Heart Failure Clinic Note   Primary Care: Dr. Anitra Lauth Primary Cardiologist: Dr. [name]  HPI:  Joshua [name] is a 76 y.o. 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   Admitted 5/31 -> 06/11/16 with worsening SOB, volume overload, and increased 02 demand. Echo showed EF down to 25-30%.  Diuresed on IV lasix and given solumedrol. Thought to be mixed AECOPD and acute CHF. Moved to stepdown 06/05/16 with worsening resp status and BiPAP requirement. Improved with nebs, steroids, ABX and IV lasix + metolazone. Sent home on [place] and prednisone taper. Overall diuresed 22 lbs. He had left and right heart cath with patent grafts and no interventional target.  Discharge weight 233 lbs.   Pt admitted from 6/11 -> 06/17/16 with syncope (unwitnessed) Noted to have fall x 3. EKG with LBBB. Creatinine mildly elevated. CT head negative for acute process. R hip xray negative for fracture or dislocated hardware.  Felt to be vasovagal. Received NS and diuretics held up to discharge.  Creatinine improved.  He is stable clinically.  No longer using oxygen.  He is not smoking (quit in 10/17).  At last appointment, he was started on Entresto but developed lightheadedness/hypotension and it had to be stopped. He is back on losartan 12.5 mg daily. No chest pain.  He is short of breath after walking about 100 feet. No orthopnea/PND. He has a chronic dry cough.  No lightheadedness/dizziness now. No BRBPR/melena. Weight is down 10 lbs.   Echo done today was reviewed, EF 40-45% with septal-lateral dyssynchrony.   Labs (8/18): K 4.7, creatinine 1.05 Labs (9/18): K 4.3, creatinine 1.44  PMH:  1. CAD: s/p CABG in 8/15.  - LHC (6/18) with 80% distal LM, TO pLAD, TO ostial LCx, subtotal occlusion distal RCA.  LIMA-LAD patent, Y graft to OM and D patent (50% stenosis in graft at touchdown on OM), patent SVG-PDA.  2. Chronic systolic CHF: Ischemic cardiomyopathy.  Echo  (6/18) with EF 25-30%, diffuse HK, moderate MR, mildly dilated RV with mildly decreased systolic function, PASP 48 mmHg.  - RHC (6/18): mean RA 10, PA 45/26, mean PCWP 21, CI 2.11 (Fick) and 2.41 (thermo), PVR 2.9 WU.  - Echo (9/18): EF 40-45%, septal-lateral dyssynchrony, diffuse hypokinesis, normal RV size and systolic function.  3. COPD: Prior smoker.   4. Hyperlipidemia 5. Atrial fibrillation: Paroxysmal.  6. Type II diabetes 7. HTN 8. Anemia: h/o Fe deficiency 9. 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  10. H/o pericardial effusion with tamponade in 6/15.  11. HTN 12. LBBB  Review of systems complete and found to be negative unless listed in HPI.     Current Outpatient Prescriptions  Medication Sig Dispense Refill  . ACCU-CHEK GUIDE test strip USE TO CHECK BLOOD SUGAR 3 TIMES DAILY 100 each 11  . acetaminophen (TYLENOL) 325 MG tablet Take 2 tablets (650 mg total) by mouth every 4 (four) hours as needed for headache or mild pain.    . Ascorbic Acid (VITAMIN C) 1000 MG tablet Take 1,000 mg by mouth daily.    [name] atorvastatin (LIPITOR) 40 MG tablet TAKE ONE TABLET BY MOUTH EVERY DAY 30 tablet 6  . bisoprolol (ZEBETA) 5 MG tablet Take 1 tablet (5 mg total) by mouth daily. 30 tablet 6  . budesonide-formoterol (SYMBICORT) 160-4.5 MCG/ACT inhaler Inhale 2 puffs into the lungs 2 (two) times daily. 1 Inhaler 12  . Cholecalciferol (VITAMIN D-3) 1000  units CAPS Take 1,000 Units by mouth daily. Reported on 02/26/2015    . digoxin (LANOXIN) 0.125 MG tablet Take 1 tablet (0.125 mg total) by mouth daily. 30 tablet 6  . fluticasone (FLONASE) 50 MCG/ACT nasal spray Place 2 sprays into both nostrils daily. 16 g 1  . gabapentin (NEURONTIN) 100 MG capsule Take 200 mg by mouth 3 (three) times daily.    [name] guaiFENesin (MUCINEX) 600 MG 12 hr tablet Take 2 tablets (1,200 mg total) by mouth 2 (two) times daily. 30 tablet 0  . hyoscyamine (LEVSIN SL) 0.125 MG SL tablet 1-2  tabs po q6h prn diarrhea 30 tablet 1  . Insulin Glargine (TOUJEO MAX SOLOSTAR) 300 UNIT/ML SOPN Inject 50 Units into the skin 2 (two) times daily.    . insulin regular (NOVOLIN R,HUMULIN R) 100 units/mL injection Inject 22-34 Units into the skin 3 (three) times daily before meals.    . isosorbide mononitrate (IMDUR) 30 MG 24 hr tablet TAKE ONE TABLET BY MOUTH EVERY DAY 90 tablet 1  . losartan (COZAAR) 25 MG tablet Take 1 tablet (25 mg total) by mouth daily. 45 tablet 3  . mirtazapine (REMERON) 15 MG tablet Take 1 tablet (15 mg total) by mouth at bedtime. 30 tablet 0  . Multiple Vitamin (MULTIVITAMIN WITH MINERALS) TABS Take 1 tablet by mouth daily.    . nitroGLYCERIN (NITROSTAT) 0.4 MG SL tablet Place 1 tablet (0.4 mg total) under the tongue every 5 (five) minutes as needed for chest pain. 25 tablet 3  . potassium chloride SA (K-DUR,KLOR-CON) 20 MEQ tablet Take 1 tablet (20 mEq total) by mouth daily. 30 tablet 6  . PROAIR HFA 108 (90 Base) MCG/ACT inhaler INHALE 2 PUFFS INTO THE LUNGS EVERY 4 HOURS AS NEEDED FOR WHEEZING ORSHORTNESS OF BREATH 8.5 g 1  . rivaroxaban (XARELTO) 20 MG TABS tablet Take 1 tablet (20 mg total) by mouth daily with supper. 90 tablet 3  . spironolactone (ALDACTONE) 25 MG tablet Take 1 tablet (25 mg total) by mouth daily. 30 tablet 6  . SYNJARDY 05-998 MG TABS TAKE ONE TABLET BY MOUTH TWICE DAILY AFTER A MEAL 60 tablet 2  . tamsulosin (FLOMAX) 0.4 MG CAPS capsule TAKE 1 CAPSULE BY MOUTH EVERY DAY AFTER SUPPER 90 capsule 1  . torsemide (DEMADEX) 20 MG tablet Take 40 mg by mouth daily. Take 2 tablets daily     No current facility-administered medications for this encounter.     Allergies  Allergen Reactions  . Morphine And Related Other (See Comments)    Drenched with perspiration  . Entresto [Sacubitril-Valsartan]     hypotension  . Demerol [Meperidine] Nausea Only  . Starlix [Nateglinide] Other (See Comments)    gassy      Social History   Social History  .  Marital status: Married    Spouse name: [name]  . Number of children: 4  . Years of education: N/A   Occupational History  . retired, former Scientist, forensic    Social History Main Topics  . Smoking status: Former Smoker    Packs/day: 0.25    Years: 62.00    Types: Cigarettes    Quit date: 10/23/2015  . Smokeless tobacco: Former Systems developer    Quit date: 06/30/2013  . Alcohol use No  . Drug use: No  . Sexual activity: No   Other Topics Concern  . Not on file   Social History Narrative   Married, 4 children.   Formerly a Medical illustrator.  Level of education: HS.     +tobacco--lifelong/ quit 2015).  No alcohol or drugs.   No exercise.  +excessive caffeine.      Family History  Problem Relation Age of Onset  . Heart disease Father   . Heart attack Father   . CVA Mother   . Hypertension Mother   . Diabetes Paternal Grandmother   . Breast cancer Sister   . Diabetes Maternal Uncle   . Heart disease Maternal Uncle   . Stroke Neg Hx     Vitals:   09/22/16 1521  BP: (!) 135/54  Pulse: 90  SpO2: 95%  Weight: 226 lb (102.5 kg)   Wt Readings from Last 3 Encounters:  09/22/16 226 lb (102.5 kg)  09/10/16 229 lb 8 oz (104.1 kg)  09/10/16 228 lb 3.2 oz (103.5 kg)     PHYSICAL EXAM: General: NAD Neck: No JVD, no thyromegaly or thyroid nodule.  Lungs: Clear to auscultation bilaterally with normal respiratory effort. CV: Nondisplaced PMI.  Heart regular S1/S2, no S3/S4, no murmur. Trace ankle edema.  No carotid bruit.  Normal pedal pulses.  Abdomen: Soft, nontender, no hepatosplenomegaly, no distention.  Skin: Intact without lesions or rashes.  Neurologic: Alert and oriented x 3.  Psych: Normal affect. Extremities: No clubbing or cyanosis.  HEENT: Normal.   ASSESSMENT & PLAN:  1. Chronic systolic CHF: Ischemic cardiomyopathy.  Echo (6/18) with EF 25-30%.  Repeat echo today with EF 40-45%, septal-lateral dyssynchrony.  NYHA class III symptoms, stable.  Weight  is down about 10 lbs.  - Continue torsemide 40 mg daily and spironolactone 25 mg daily.  - He did not tolerate low dose Entresto due to hypotension.  Increase losartan to 25 mg daily, BMET 10 days.   - Continue digoxin, check level when he returns for labs in 10 days.  - Continue bisoprolol 5 mg daily.  - EF is > 35% on today's echo, not candidate for CRT-D at this point.   2. COPD: No longer smoking.  Not using home oxygen at this time.  3. CAD s/p CABG: Cath 06/10/16 with severe 3 vessel native disease with patent bypass grafts. No interventional target.  - Continue statin.  With stable CAD and Xarelto use, does not need to be on ASA (can stop).  4. Atrial fibrillation: Paroxysmal.  He is in NSR today.  - Continue Xarelto. CBC today.  5. Syncope: Episode this year thought to be vasovagal.  Loralie Champagne, MD 09/23/16

## 2016-09-25 DIAGNOSIS — J449 Chronic obstructive pulmonary disease, unspecified: Secondary | ICD-10-CM | POA: Diagnosis not present

## 2016-10-02 ENCOUNTER — Encounter: Payer: Self-pay | Admitting: Endocrinology

## 2016-10-02 ENCOUNTER — Ambulatory Visit (INDEPENDENT_AMBULATORY_CARE_PROVIDER_SITE_OTHER): Payer: Medicare Other | Admitting: Endocrinology

## 2016-10-02 ENCOUNTER — Other Ambulatory Visit: Payer: Medicare Other

## 2016-10-02 VITALS — BP 138/66 | HR 68 | Ht 71.0 in | Wt 239.2 lb

## 2016-10-02 DIAGNOSIS — E1165 Type 2 diabetes mellitus with hyperglycemia: Secondary | ICD-10-CM

## 2016-10-02 DIAGNOSIS — N289 Disorder of kidney and ureter, unspecified: Secondary | ICD-10-CM | POA: Diagnosis not present

## 2016-10-02 DIAGNOSIS — Z794 Long term (current) use of insulin: Secondary | ICD-10-CM | POA: Diagnosis not present

## 2016-10-02 NOTE — Patient Instructions (Addendum)
Call before out of Toujeo, need to use U-500 insulin  TAKE MORE SUGARS BEFORE OR AFTER SUPPER  Take 56 Toujeo 2x daily  Regular insulin 24 units am and lunch and 38 at dinner

## 2016-10-02 NOTE — Progress Notes (Addendum)
Patient ID: Joshua [name], male   DOB: 1940/11/21, 76 y.o.   MRN: [id number]            Reason for Appointment:  Follow-up for Type 2 Diabetes  Referring physician: [place]   History of Present Illness:          Date of diagnosis of type 2 diabetes mellitus: ?  2002        Background history:   He apparently had been treated with metformin initially which was continued until about 2016. He previously has also been tried on Amaryl, Actos, Farxiga and Victoza but he claims that these did not help his blood sugar and were stopped Appears to have been on insulin since about 2014 but he does not remember details. He has been taking mostly Levemir insulin as a basal insulin but was also on Lantus in 2015 for some time His best A1c has been 7.3, once in 2014 and another time in 2016, otherwise his A1c has been higher and as high as 10.8  Recent history:   INSULIN regimen is:   Toujeo 80 units in a.m., Humalog 18-20-34 units before meals tid   Non-insulin hypoglycemic drugs the patient is taking are: Synjardy 05/998 twice a day  His last A1c was down to 7.3 in 5/15 and now is back up to 9.3  Current management, blood sugar patterns and problems identified:  He had much higher blood sugars on his last visit and his insulin was increased, Toujeo by 20 minutes daily and to less degree the Regular Insulin   Humalog also was switched to Regular Insulin for longer duration of action  However his blood sugars only show slight improvement and average sugar in the morning is over 200  He did have 3 days earlier this month where his blood sugars were near normal  He forgets to check her sugars later in the day and only have morning readings except for 2 readings at 9:00 at night  Does not have any readings after breakfast and lunch  Also taking Synjardy twice a day  Unable to exercise because of various physical limitations  He thinks he is doing fairly well with low fat diet, has seen  the dietitian previously  However his weight is starting to go up    Side effects from medications have been: None  Compliance with the medical regimen: Fair Hypoglycemia: Never    Glucose monitoring:  done 2 times a day         Glucometer:  Accu-Chek .      Blood Glucose readings with averages from download as below   Mean values apply above for all meters except median for One Touch  PRE-MEAL Fasting Lunch Dinner Bedtime Overall  Glucose range:  11 3-277       Mean/median: 224    2 90, 299      Self-care: The diet that the patient has been following is: tries to limit Sweet drinks .     Meal times are:  Breakfast is at 7 a.m.  Dinner: 5-6 PM    Typical meal intake: Breakfast is usually eggs,  Sausage, oatmeal or grits.  Lunch is a sandwich, any evening he has meat and vegetables, For snacks he will have fruit or crackers                Dietician visit, most recent: 01/09/16 CDE visit: 12/18  Exercise: Unable to do any Because of fatigue     Weight history:  Wt Readings from Last 3 Encounters:  10/02/16 239 lb 3.2 oz (108.5 kg)  09/22/16 226 lb (102.5 kg)  09/10/16 229 lb 8 oz (104.1 kg)    Glycemic control:   Lab Results  Component Value Date   HGBA1C 9.3 08/20/2016   HGBA1C 7.3 (H) 06/04/2016   HGBA1C 7.5 05/06/2016   Lab Results  Component Value Date   MICROALBUR 1.4 07/07/2016   LDLCALC 44 02/19/2016   CREATININE 1.44 (H) 09/09/2016   Lab Results  Component Value Date   MICRALBCREAT 1.2 07/07/2016    Other active problems: See review of systems   Allergies as of 10/02/2016      Reactions   Morphine And Related Other (See Comments)   Drenched with perspiration   Entresto [sacubitril-valsartan]    hypotension   Demerol [meperidine] Nausea Only   Starlix [nateglinide] Other (See Comments)   gassy      Medication List       Accurate as of 10/02/16  9:03 AM. Always use your most recent med list.          ACCU-CHEK GUIDE test  strip Generic drug:  glucose blood USE TO CHECK BLOOD SUGAR 3 TIMES DAILY   acetaminophen 325 MG tablet Commonly known as:  TYLENOL Take 2 tablets (650 mg total) by mouth every 4 (four) hours as needed for headache or mild pain.   atorvastatin 40 MG tablet Commonly known as:  LIPITOR TAKE ONE TABLET BY MOUTH EVERY DAY   bisoprolol 5 MG tablet Commonly known as:  ZEBETA Take 1 tablet (5 mg total) by mouth daily.   budesonide-formoterol 160-4.5 MCG/ACT inhaler Commonly known as:  SYMBICORT Inhale 2 puffs into the lungs 2 (two) times daily.   digoxin 0.125 MG tablet Commonly known as:  LANOXIN Take 1 tablet (0.125 mg total) by mouth daily.   fluticasone 50 MCG/ACT nasal spray Commonly known as:  FLONASE Place 2 sprays into both nostrils daily.   gabapentin 100 MG capsule Commonly known as:  NEURONTIN Take 200 mg by mouth 3 (three) times daily.   guaiFENesin 600 MG 12 hr tablet Commonly known as:  MUCINEX Take 2 tablets (1,200 mg total) by mouth 2 (two) times daily.   hyoscyamine 0.125 MG SL tablet Commonly known as:  LEVSIN SL 1-2 tabs po q6h prn diarrhea   insulin regular 100 units/mL injection Commonly known as:  NOVOLIN R,HUMULIN R Inject 22-35 Units into the skin 3 (three) times daily before meals.   isosorbide mononitrate 30 MG 24 hr tablet Commonly known as:  IMDUR TAKE ONE TABLET BY MOUTH EVERY DAY   losartan 25 MG tablet Commonly known as:  COZAAR Take 1 tablet (25 mg total) by mouth daily.   mirtazapine 15 MG tablet Commonly known as:  REMERON Take 1 tablet (15 mg total) by mouth at bedtime.   multivitamin with minerals Tabs tablet Take 1 tablet by mouth daily.   nitroGLYCERIN 0.4 MG SL tablet Commonly known as:  NITROSTAT Place 1 tablet (0.4 mg total) under the tongue every 5 (five) minutes as needed for chest pain.   potassium chloride SA 20 MEQ tablet Commonly known as:  K-DUR,KLOR-CON Take 1 tablet (20 mEq total) by mouth daily.   PROAIR  HFA 108 (90 Base) MCG/ACT inhaler Generic drug:  albuterol INHALE 2 PUFFS INTO THE LUNGS EVERY 4 HOURS AS NEEDED FOR WHEEZING ORSHORTNESS OF BREATH   rivaroxaban  20 MG Tabs tablet Commonly known as:  XARELTO Take 1 tablet (20 mg total) by mouth daily with supper.   spironolactone 25 MG tablet Commonly known as:  ALDACTONE Take 1 tablet (25 mg total) by mouth daily.   SYNJARDY 05-998 MG Tabs Generic drug:  Empagliflozin-Metformin HCl TAKE ONE TABLET BY MOUTH TWICE DAILY AFTER A MEAL   tamsulosin 0.4 MG Caps capsule Commonly known as:  FLOMAX TAKE 1 CAPSULE BY MOUTH EVERY DAY AFTER SUPPER   torsemide 20 MG tablet Commonly known as:  DEMADEX Take 20 mg by mouth daily. Takes 1 tablet daily   TOUJEO MAX SOLOSTAR 300 UNIT/ML Sopn Generic drug:  Insulin Glargine Inject 50 Units into the skin 2 (two) times daily.   vitamin C 1000 MG tablet Take 1,000 mg by mouth daily.   Vitamin D-3 1000 units Caps Take 1,000 Units by mouth daily. Reported on 02/26/2015       Allergies:  Allergies  Allergen Reactions  . Morphine And Related Other (See Comments)    Drenched with perspiration  . Entresto [Sacubitril-Valsartan]     hypotension  . Demerol [Meperidine] Nausea Only  . Starlix [Nateglinide] Other (See Comments)    gassy    Past Medical History:  Diagnosis Date  . Acute respiratory distress 06/04/2016  . Adenomatous colon polyp 10/16/2011   Repeat 2018  . Arthritis     Hips, R>L & KNEES  . CAD, multiple vessel    3V CAD cath 08/08/13----CABG done shortly after.  . 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 pylori neg, no dysplasia or malignancy.  . Chronic diastolic heart failure ([place]) 2016  . Chronic renal insufficiency, stage 3 (moderate) 2018   GFR 50s  . COPD (chronic obstructive pulmonary disease) ([place])    GOLD II.  Spirometry  2004 borderline obstruction; 2015 mod obst: noncompliant with bid symbicort so pulmonologist switched him to  a once daily inhaler: Trelegy ellipta 12/2015.  Oxygen prn: goal 88-92%.  . Diabetic nephropathy ([place])    Elevated urine microalb/cr 03/2011  . DM type 2 (diabetes mellitus, type 2) ([place])    Poor control on max oral meds--pt eventually agreed to insulin therapy.  As of 2017 his DM is being managed by Dr. Dwyane Dee in endo.  . DOE (dyspnea on exertion)    COPD + chronic diastolic HF  . Erectile dysfunction    Normal testosterone  . Hearing loss of both ears 2016   Hearing aids  . Hyperlipidemia   . Hyperplastic colon polyp 2001  . Hypertension   . Iron deficiency anemia 2014   03/2012 capsule endoscopy showed 2 AVMs--likely responsible for his IDA--lifetime iron supp recommended + q55moCBCs.  . Ischemic cardiomyopathy 2018   06/2016: EF 25-30%, global hypokinesis, grd II DD, vent septum motion changes c/w LBBB, mod MV regurg, L atr mod/sev dilat, R atr mod dilat, mod incr pulm press, Grd II DD.  . 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 Obesity   . Open toe wound 11/2015   Wound care clinic appt made and then canceled when toe improved.  . Other and unspecified angina pectoris   . PAD (peripheral artery disease) ([place] 02/2016   Abnormal ABI's and waveforms: LE arterial duplex ordered for f/u as per cardiologist's recommendation.  Vasc eval by Dr. CBridgett Larsson impression was minimal PAD, recommended maximize med mgmt.  .Marland KitchenPAF (paroxysmal atrial fibrillation) (  [place]) 10/2014   xarelto  . Pericardial effusion with cardiac tamponade 6//29/15   pericardiocentesis was done,  Infectious/inflamm (cytology showed NO MALIGNANT CELLS)  . Pleural plaque    Pleural plaques/asbestosis changes on CT chest done by pulm 05/2016.  [name] Pneumonia   . Tobacco dependence in remission    100+ pack-yr hx: quit after CABG    Past Surgical History:  Procedure Laterality Date  . APPENDECTOMY  1957  . CARDIAC CATHETERIZATION  08/08/2013  . CATARACT  EXTRACTION W/ INTRAOCULAR LENS  IMPLANT, BILATERAL  04/08/2006 & 04/22/2006  . CATARACT EXTRACTION W/ INTRAOCULAR LENS  IMPLANT, BILATERAL Bilateral   . CHOLECYSTECTOMY OPEN  1999  . COLONOSCOPY  10/16/2011   Procedure: COLONOSCOPY;  Surgeon: Inda Castle, MD;  Location: [facility];  Service: Endoscopy;  Laterality: N/A;  . CORONARY ARTERY BYPASS GRAFT N/A 08/14/2013   Procedure: CORONARY ARTERY BYPASS GRAFTING (CABG);  Surgeon: Melrose Nakayama, MD;  Location: [place];  Service: Open Heart Surgery;  Laterality: N/A;  Times 4   using left internal mammary artery and endoscopically harvested bilateral saphenous vein  . ESOPHAGOGASTRODUODENOSCOPY  02/25/12   Atrophic gastritis with a few erosions--capsule endoscopy planned as of 02/25/12 (Dr. Deatra Ina).  [name] HARDWARE REMOVAL Right 08/12/2012   Procedure: HARDWARE REMOVAL;  Surgeon: Mcarthur Rossetti, MD;  Location: [facility];  Service: Orthopedics;  Laterality: Right;  . HIP SURGERY Right 1954   Repair of slipped capital femoral epiphysis.  . INTRAOPERATIVE TRANSESOPHAGEAL ECHOCARDIOGRAM N/A 08/14/2013   Normal LV function. Procedure: INTRAOPERATIVE TRANSESOPHAGEAL ECHOCARDIOGRAM;  Surgeon: Melrose Nakayama, MD;  Location: [place];  Service: Open Heart Surgery;  Laterality: N/A;  . LEFT HEART CATHETERIZATION WITH CORONARY ANGIOGRAM N/A 08/08/2013   Procedure: LEFT HEART CATHETERIZATION WITH CORONARY ANGIOGRAM;  Surgeon: Jettie Booze, MD;  Location[facility] LAB;  Service: Cardiovascular;  Laterality: N/A;  . PATELLA FRACTURE SURGERY Left ~ 1979   bolt + 3 screws to repair tib plateau fx  . PERICARDIAL TAP N/A 07/01/2013   Procedure: PERICARDIAL TAP;  Surgeon: Jettie Booze, MD;  Location: [facility] CATH LAB;  Service: Cardiovascular;  Laterality: N/A;  . RIGHT/LEFT HEART CATH AND CORONARY ANGIOGRAPHY N/A 06/10/2016   Procedure: Right/Left Heart Cath and Coronary Angiography;  Surgeon: Larey Dresser, MD;  Location: [place] CV LAB;  Service:  Cardiovascular;  Laterality: N/A;  . TESTICLE SURGERY  as a child   Undescended testicle brought down into scrotum  . TONSILLECTOMY  1947  . TOTAL HIP ARTHROPLASTY Right 08/12/2012   Procedure: REMOVAL OF OLD PINS RIGHT HIP AND RIGHT TOTAL HIP ARTHROPLASTY ANTERIOR APPROACH;  Surgeon: Mcarthur Rossetti, MD;  Location: [facility];  Service: Orthopedics;  Laterality: Right;  . TRANSTHORACIC ECHOCARDIOGRAM  10/30/14   Mod LVH, EF 60-65%, normal wall motion, mod mitral regurg, mild PAH  . TRANSTHORACIC ECHOCARDIOGRAM  06/2016   06/2016: EF 25-30%, global hypokinesis, grd II DD, vent septum motion changes c/w LBBB, mod MV regurg, L atr mod/sev dilat, R atr mod dilat, mod incr pulm press, Grd II DD.    Family History  Problem Relation Age of Onset  . Heart disease Father   . Heart attack Father   . CVA Mother   . Hypertension Mother   . Diabetes Paternal Grandmother   . Breast cancer Sister   . Diabetes Maternal Uncle   . Heart disease Maternal Uncle   . Stroke Neg Hx     Social History:  reports that he quit  smoking about a year ago. His smoking use included Cigarettes. He has a 15.50 pack-year smoking history. He quit smokeless tobacco use about 3 years ago. He reports that he does not drink alcohol or use drugs.   Review of Systems   Lipid history: He had been prescribed Lipitor 40 mg  Has adequate control of LDL    Lab Results  Component Value Date   CHOL 119 02/19/2016   HDL 43.30 02/19/2016   LDLCALC 44 02/19/2016   LDLDIRECT 187.0 11/21/2015   TRIG 159.0 (H) 02/19/2016   CHOLHDL 3 02/19/2016           Hypertension:Currently treated with bisoprolol 5 mg,25 mg losartan  Also on diuretics for CHF Followed by cardiologist   BP Readings from Last 3 Encounters:  10/02/16 138/66  09/22/16 (!) 135/54  09/10/16 (!) 116/50     Most recent eye exam was 7/17, Dr Marina Gravel? Has had no retinopathy  Most recent foot exam: 12/17   LABS:  No visits with results within 1  Week(s) from this visit.  Latest known visit with results is:  Hospital Outpatient Visit on 09/09/2016  Component Date Value Ref Range Status  . Sodium 09/09/2016 140  135 - 145 mmol/L Final  . Potassium 09/09/2016 4.3  3.5 - 5.1 mmol/L Final  . Chloride 09/09/2016 101  101 - 111 mmol/L Final  . CO2 09/09/2016 26  22 - 32 mmol/L Final  . Glucose, Bld 09/09/2016 206* 65 - 99 mg/dL Final  . BUN 09/09/2016 25* 6 - 20 mg/dL Final  . Creatinine, Ser 09/09/2016 1.44* 0.61 - 1.24 mg/dL Final  . Calcium 09/09/2016 9.1  8.9 - 10.3 mg/dL Final  . GFR calc non Af Amer 09/09/2016 45* >60 mL/min Final  . GFR calc Af Amer 09/09/2016 53* >60 mL/min Final   Comment: (NOTE) The eGFR has been calculated using the CKD EPI equation. This calculation has not been validated in all clinical situations. eGFR's persistently <60 mL/min signify possible Chronic Kidney Disease.   . Anion gap 09/09/2016 13  5 - 15 Final    Physical Examination:  BP 138/66   Pulse 68   Ht _0  (1.803 m)   Wt 239 lb 3.2 oz (108.5 kg)   SpO2 93%   BMI 33.36 kg/m       ASSESSMENT:  Diabetes type 2, uncontrolled    See history of present illness for detailed discussion of current diabetes management, blood sugar patterns and problems identified  He appears to be significantly insulin resistant and blood sugars are over 200 despite taking 175 units of insulin in total Even with taking 100 units of basal insulin and his fasting readings are averaging 224 Also probably has high postprandial readings as judged by a few sporadic readings at night or in the lab Last A1c was over 9%  Because of his insulin resistance he is a good candidate for the V-go pump with the U-500 insulin Concept of the V-go pump was discussed in detail Also explained the efficacy of U-500 insulin is better than normal insulin especially with the consistent infusion in the pump He is not able to lose weight because of lack of exercise, he thinks his  diet is fairly good   HYPERTENSION: Blood pressure is  controlled   Renal function: His last level is somewhat borderline but he can continue Synjardy, using relatively lower dose of Jardiance  PLAN:     He will need to start checking his blood sugars  as directed today  Will increase his Toujeo 6 units twice a day  Increase mealtime insulin by 2 units at breakfast and lunch and 4 units at dinner for now  Discussed importance of taking the insulin before eating and also the need for better postprandial monitoring to enable adjustment of his insulin doses  Consistent diet  He will check to see if the V-go pump is covered by insurance and he can afford it  If he is agreeable to the new program he will get trained on the V-go pump by the nurse educator  Will need short-term follow-up in 6 weeks to change his plans with the V-go pump if covered  Counseling time on subjects discussed in assessment and plan sections is over 50% of today's 25 minute visit   Patient Instructions  Call before out of Toujeo, need to use U-500 insulin  TAKE MORE SUGARS BEFORE OR AFTER SUPPER  Take 56 Toujeo 2x daily  Regular insulin 24 units am and lunch and 38 at dinner       Counseling time on subjects discussed in assessment and plan sections is over 50% of today's 25 minute visit   Jahmia Berrett 10/02/2016, 9:03 AM   Note: This office note was prepared with Estate agent. Any transcriptional errors that result from this process are unintentional.   10/21/16 He has been instructed on the V-go pump but he does not have his U-500 insulin.  He will try the 40 unit pump with the Humulin R using 8 clicks at dinnertime and 4 clicks at breakfast and lunch and called results on Friday  Follow-up in 1 week  [facility]

## 2016-10-05 ENCOUNTER — Telehealth: Payer: Self-pay | Admitting: *Deleted

## 2016-10-05 ENCOUNTER — Other Ambulatory Visit (HOSPITAL_COMMUNITY): Payer: Self-pay | Admitting: *Deleted

## 2016-10-05 ENCOUNTER — Other Ambulatory Visit: Payer: Medicare Other

## 2016-10-05 ENCOUNTER — Other Ambulatory Visit (INDEPENDENT_AMBULATORY_CARE_PROVIDER_SITE_OTHER): Payer: Medicare Other

## 2016-10-05 DIAGNOSIS — I5032 Chronic diastolic (congestive) heart failure: Secondary | ICD-10-CM

## 2016-10-05 DIAGNOSIS — I5022 Chronic systolic (congestive) heart failure: Secondary | ICD-10-CM

## 2016-10-05 LAB — BASIC METABOLIC PANEL
BUN: 21 mg/dL (ref 6–23)
CHLORIDE: 101 meq/L (ref 96–112)
CO2: 25 mEq/L (ref 19–32)
CREATININE: 0.99 mg/dL (ref 0.40–1.50)
Calcium: 10.7 mg/dL — ABNORMAL HIGH (ref 8.4–10.5)
GFR: 77.82 mL/min (ref >60.00)
Glucose, Bld: 346 mg/dL — ABNORMAL HIGH (ref 70–99)
POTASSIUM: 4.9 meq/L (ref 3.5–5.1)
Sodium: 138 mEq/L (ref 135–145)

## 2016-10-05 NOTE — Telephone Encounter (Signed)
Pt came into office today requesting Rx for portable oxygen concentrator with titration. Rx written by Dr. Anitra Lauth and give to Diane to give to pt.

## 2016-10-06 ENCOUNTER — Telehealth: Payer: Self-pay | Admitting: Family Medicine

## 2016-10-06 NOTE — Telephone Encounter (Signed)
Diane, I believe Dr. Anitra Lauth gave you this order, was it faxed?

## 2016-10-06 NOTE — Telephone Encounter (Signed)
Patient's wife requesting call back from [place].  She needs to know if order for oxygen concentrator was faxed to [facility] yesterday.

## 2016-10-06 NOTE — Telephone Encounter (Signed)
Faxed Rx to [facility]

## 2016-10-07 NOTE — Telephone Encounter (Signed)
Order faxed to [facility]. Pts wife advised and voiced understanding.

## 2016-10-07 NOTE — Telephone Encounter (Signed)
OK. Rx for home oxygen concentrator written. Pls fax to [facility].-thx

## 2016-10-07 NOTE — Telephone Encounter (Signed)
SW pts wife, she is now requesting a Rx for Home oxygen concentrator. Please advise. Thanks.

## 2016-10-07 NOTE — Telephone Encounter (Signed)
Patient's wife states that they need another RX for pt's Home oxygen concentrator.  [facility] are switching [facility] to [facility].  Please send RX for home oxygen to [place].

## 2016-10-08 ENCOUNTER — Ambulatory Visit: Payer: Self-pay

## 2016-10-08 ENCOUNTER — Other Ambulatory Visit: Payer: Self-pay

## 2016-10-08 NOTE — Patient Outreach (Signed)
[facility]) Care Management  10/08/2016  Joshua [name] 25-May-1940 [id number]  Subjective: everything is good.  Objective: none  Assessment: 76 yr old with history of heart failure, weakness,COPD, DM, atrial fibrillation.  RNCM called to follow up. Client denies any questions or concerns. Client reports he does not need education for heart failure, diabetes or COPD. RNCM offered Health coach for disease management. Client declines. RNCM discussed case closure. Client is agreeable.  RNCM encouraged client to call as needed RNCM reinforced to client that he can call 24 hour nurse advice line as needed.  Plan: close case.  Thea Silversmith, RN, MSN, [place] Coordinator Cell: [phone]

## 2016-10-09 ENCOUNTER — Ambulatory Visit (INDEPENDENT_AMBULATORY_CARE_PROVIDER_SITE_OTHER): Payer: Medicare Other | Admitting: Family Medicine

## 2016-10-09 ENCOUNTER — Encounter: Payer: Self-pay | Admitting: Family Medicine

## 2016-10-09 VITALS — BP 95/53 | HR 62 | Temp 97.4°F | Resp 16 | Ht 71.0 in | Wt 236.8 lb

## 2016-10-09 DIAGNOSIS — I5042 Chronic combined systolic (congestive) and diastolic (congestive) heart failure: Secondary | ICD-10-CM

## 2016-10-09 DIAGNOSIS — F321 Major depressive disorder, single episode, moderate: Secondary | ICD-10-CM

## 2016-10-09 DIAGNOSIS — J9611 Chronic respiratory failure with hypoxia: Secondary | ICD-10-CM

## 2016-10-09 DIAGNOSIS — J449 Chronic obstructive pulmonary disease, unspecified: Secondary | ICD-10-CM | POA: Diagnosis not present

## 2016-10-09 MED ORDER — MIRTAZAPINE 30 MG PO TABS: 30.0000 mg | ORAL_TABLET | Freq: Every day | ORAL | 1 refills | Status: DC

## 2016-10-09 NOTE — Progress Notes (Signed)
OFFICE VISIT  10/09/2016   CC:  Chief Complaint  Patient presents with  . Follow-up    Anxiety and Depression   HPI:    Patient is a 76 y.o. Caucasian male who presents accompanied by his wife for 1 mo f/u anxiety and depression. Last visit we had weened him off cital and wellbut, started remeron 100m qd and referred him to Dr. MGaynell Facefor counseling.  No real change in mood since getting on remeron 160mqhs.  STill feel down,depressed most days, little interest in doing things, trouble with sleep, tired and little energy, trouble concentrating.  First appt with Dr. MeGaynell Faces in 4 days.  Also f/u chronic hypoxic resp failure with COPD and chronic combined systolic and diastolic HF. We discussed need for oxygen supplementation in detail today.  At rest he is not hypoxic, but with walking on RA he drops to 84% as documented in office visit today.  He states he feels at his baseline from breathing/resp standpoint.  No fevers, no recent SOB at rest.  NO CP. No LE swelling.     Past Medical History:  Diagnosis Date  . Acute respiratory distress 06/04/2016  . Adenomatous colon polyp 10/16/2011   Repeat 2018  . Arthritis     Hips, R>L & KNEES  . CAD, multiple vessel    3V CAD cath 08/08/13----CABG done shortly after.  . 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 pylori neg, no dysplasia or malignancy.  . Chronic diastolic heart failure (HCMaloy2016  . Chronic renal insufficiency, stage 3 (moderate) (HCC) 2018   GFR 50s  . COPD (chronic obstructive pulmonary disease) (HCLedyard   GOLD II.  Spirometry  2004 borderline obstruction; 2015 mod obst: noncompliant with bid symbicort so pulmonologist switched him to a once daily inhaler: Trelegy ellipta 12/2015.  Oxygen prn: goal 88-92%.  . Diabetic nephropathy (HCPaxton   Elevated urine microalb/cr 03/2011  . DM type 2 (diabetes mellitus, type 2) (HCDarden   Poor control on max oral meds--pt eventually agreed to  insulin therapy.  As of 2017 his DM is being managed by Dr. KuDwyane Deen endo.  . DOE (dyspnea on exertion)    COPD + chronic diastolic HF  . Erectile dysfunction    Normal testosterone  . Hearing loss of both ears 2016   Hearing aids  . Hyperlipidemia   . Hyperplastic colon polyp 2001  . Hypertension   . Iron deficiency anemia 2014   03/2012 capsule endoscopy showed 2 AVMs--likely responsible for his IDA--lifetime iron supp recommended + q6m61moCs.  . Ischemic cardiomyopathy 2018   06/2016: EF 25-30%, global hypokinesis, grd II DD, vent septum motion changes c/w LBBB, mod MV regurg, L atr mod/sev dilat, R atr mod dilat, mod incr pulm press, Grd II DD.  . Macular degeneration, dry    Mild, bilat (Optometrist, [place] [employer] [place]. Obesity   . Open toe wound 11/2015   Wound care clinic appt made and then canceled when toe improved.  . Other and unspecified angina pectoris   . PAD (peripheral artery disease) (HCCLeggett2/2018   Abnormal ABI's and waveforms: LE arterial duplex ordered for f/u as per cardiologist's recommendation.  Vasc eval by Dr. CheBridgett Larssonmpression was minimal PAD, recommended maximize med mgmt.  . PMarland KitchenF (paroxysmal atrial fibrillation) (HCCKendleton0/2016   xarelto  . Pericardial effusion with cardiac tamponade 6//29/15   pericardiocentesis was done,  Infectious/inflamm (  cytology showed NO MALIGNANT CELLS)  . Pleural plaque    Pleural plaques/asbestosis changes on CT chest done by pulm 05/2016.  [name] Pneumonia   . Tobacco dependence in remission    100+ pack-yr hx: quit after CABG    Past Surgical History:  Procedure Laterality Date  . APPENDECTOMY  1957  . CARDIAC CATHETERIZATION  08/08/2013  . CATARACT EXTRACTION W/ INTRAOCULAR LENS  IMPLANT, BILATERAL  04/08/2006 & 04/22/2006  . CATARACT EXTRACTION W/ INTRAOCULAR LENS  IMPLANT, BILATERAL Bilateral   . CHOLECYSTECTOMY OPEN  1999  . COLONOSCOPY  10/16/2011   Procedure: COLONOSCOPY;  Surgeon: Inda Castle, MD;  Location: [facility];  Service: Endoscopy;  Laterality: N/A;  . CORONARY ARTERY BYPASS GRAFT N/A 08/14/2013   Procedure: CORONARY ARTERY BYPASS GRAFTING (CABG);  Surgeon: Melrose Nakayama, MD;  Location: [facility];  Service: Open Heart Surgery;  Laterality: N/A;  Times 4   using left internal mammary artery and endoscopically harvested bilateral saphenous vein  . ESOPHAGOGASTRODUODENOSCOPY  02/25/12   Atrophic gastritis with a few erosions--capsule endoscopy planned as of 02/25/12 (Dr. Deatra Ina).  [name] HARDWARE REMOVAL Right 08/12/2012   Procedure: HARDWARE REMOVAL;  Surgeon: Mcarthur Rossetti, MD;  Location: [facility];  Service: Orthopedics;  Laterality: Right;  . HIP SURGERY Right 1954   Repair of slipped capital femoral epiphysis.  . INTRAOPERATIVE TRANSESOPHAGEAL ECHOCARDIOGRAM N/A 08/14/2013   Normal LV function. Procedure: INTRAOPERATIVE TRANSESOPHAGEAL ECHOCARDIOGRAM;  Surgeon: Melrose Nakayama, MD;  Location: [facility];  Service: Open Heart Surgery;  Laterality: N/A;  . LEFT HEART CATHETERIZATION WITH CORONARY ANGIOGRAM N/A 08/08/2013   Procedure: LEFT HEART CATHETERIZATION WITH CORONARY ANGIOGRAM;  Surgeon: Jettie Booze, MD;  Location: [facility] CATH LAB;  Service: Cardiovascular;  Laterality: N/A;  . PATELLA FRACTURE SURGERY Left ~ 1979   bolt + 3 screws to repair tib plateau fx  . PERICARDIAL TAP N/A 07/01/2013   Procedure: PERICARDIAL TAP;  Surgeon: Jettie Booze, MD;  Location: [facility] CATH LAB;  Service: Cardiovascular;  Laterality: N/A;  . RIGHT/LEFT HEART CATH AND CORONARY ANGIOGRAPHY N/A 06/10/2016   Procedure: Right/Left Heart Cath and Coronary Angiography;  Surgeon: Larey Dresser, MD;  Location: [facility] CV LAB;  Service: Cardiovascular;  Laterality: N/A;  . TESTICLE SURGERY  as a child   Undescended testicle brought down into scrotum  . TONSILLECTOMY  1947  . TOTAL HIP ARTHROPLASTY Right 08/12/2012   Procedure: REMOVAL OF OLD PINS RIGHT HIP AND RIGHT TOTAL HIP  ARTHROPLASTY ANTERIOR APPROACH;  Surgeon: Mcarthur Rossetti, MD;  Location: [facility];  Service: Orthopedics;  Laterality: Right;  . TRANSTHORACIC ECHOCARDIOGRAM  10/30/14   Mod LVH, EF 60-65%, normal wall motion, mod mitral regurg, mild PAH  . TRANSTHORACIC ECHOCARDIOGRAM  06/2016   06/2016: EF 25-30%, global hypokinesis, grd II DD, vent septum motion changes c/w LBBB, mod MV regurg, L atr mod/sev dilat, R atr mod dilat, mod incr pulm press, Grd II DD.    Outpatient Medications Prior to Visit  Medication Sig Dispense Refill  . ACCU-CHEK GUIDE test strip USE TO CHECK BLOOD SUGAR 3 TIMES DAILY 100 each 11  . acetaminophen (TYLENOL) 325 MG tablet Take 2 tablets (650 mg total) by mouth every 4 (four) hours as needed for headache or mild pain.    . Ascorbic Acid (VITAMIN C) 1000 MG tablet Take 1,000 mg by mouth daily.    [name] atorvastatin (LIPITOR) 40 MG tablet TAKE ONE TABLET BY MOUTH EVERY DAY 30 tablet 6  .  bisoprolol (ZEBETA) 5 MG tablet Take 1 tablet (5 mg total) by mouth daily. 30 tablet 6  . budesonide-formoterol (SYMBICORT) 160-4.5 MCG/ACT inhaler Inhale 2 puffs into the lungs 2 (two) times daily. 1 Inhaler 12  . Cholecalciferol (VITAMIN D-3) 1000 units CAPS Take 1,000 Units by mouth daily. Reported on 02/26/2015    . digoxin (LANOXIN) 0.125 MG tablet Take 1 tablet (0.125 mg total) by mouth daily. 30 tablet 6  . fluticasone (FLONASE) 50 MCG/ACT nasal spray Place 2 sprays into both nostrils daily. 16 g 1  . gabapentin (NEURONTIN) 100 MG capsule Take 200 mg by mouth 3 (three) times daily.    [name] guaiFENesin (MUCINEX) 600 MG 12 hr tablet Take 2 tablets (1,200 mg total) by mouth 2 (two) times daily. 30 tablet 0  . hyoscyamine (LEVSIN SL) 0.125 MG SL tablet 1-2 tabs po q6h prn diarrhea 30 tablet 1  . Insulin Glargine (TOUJEO MAX SOLOSTAR) 300 UNIT/ML SOPN Inject 50 Units into the skin 2 (two) times daily.    . insulin regular (NOVOLIN R,HUMULIN R) 100 units/mL injection Inject 22-35 Units into the  skin 3 (three) times daily before meals.     . isosorbide mononitrate (IMDUR) 30 MG 24 hr tablet TAKE ONE TABLET BY MOUTH EVERY DAY 90 tablet 1  . losartan (COZAAR) 25 MG tablet Take 1 tablet (25 mg total) by mouth daily. 45 tablet 3  . Multiple Vitamin (MULTIVITAMIN WITH MINERALS) TABS Take 1 tablet by mouth daily.    . nitroGLYCERIN (NITROSTAT) 0.4 MG SL tablet Place 1 tablet (0.4 mg total) under the tongue every 5 (five) minutes as needed for chest pain. 25 tablet 3  . potassium chloride SA (K-DUR,KLOR-CON) 20 MEQ tablet Take 1 tablet (20 mEq total) by mouth daily. 30 tablet 6  . PROAIR HFA 108 (90 Base) MCG/ACT inhaler INHALE 2 PUFFS INTO THE LUNGS EVERY 4 HOURS AS NEEDED FOR WHEEZING ORSHORTNESS OF BREATH 8.5 g 1  . rivaroxaban (XARELTO) 20 MG TABS tablet Take 1 tablet (20 mg total) by mouth daily with supper. 90 tablet 3  . spironolactone (ALDACTONE) 25 MG tablet Take 1 tablet (25 mg total) by mouth daily. 30 tablet 6  . SYNJARDY 05-998 MG TABS TAKE ONE TABLET BY MOUTH TWICE DAILY AFTER A MEAL 60 tablet 2  . tamsulosin (FLOMAX) 0.4 MG CAPS capsule TAKE 1 CAPSULE BY MOUTH EVERY DAY AFTER SUPPER 90 capsule 1  . torsemide (DEMADEX) 20 MG tablet Take 20 mg by mouth daily. Takes 1 tablet daily    . mirtazapine (REMERON) 15 MG tablet Take 1 tablet (15 mg total) by mouth at bedtime. 30 tablet 0   No facility-administered medications prior to visit.     Allergies  Allergen Reactions  . Morphine And Related Other (See Comments)    Drenched with perspiration  . Entresto [Sacubitril-Valsartan]     hypotension  . Demerol [Meperidine] Nausea Only  . Starlix [Nateglinide] Other (See Comments)    gassy    ROS As per HPI  PE: Blood pressure (!) 95/53, pulse 62, temperature (!) 97.4 F (36.3 C), temperature source Oral, resp. rate 16, height _0  (1.803 m), weight 236 lb 12 oz (107.4 kg), SpO2 96 %. 96% RA.  84% RA with ambulation.  With 2L oxygen [place] oxygen saturation was maintained at 96%  with ambulation. Gen: Alert, well appearing.  Patient is oriented to person, place, time, and situation. AFFECT: pleasant, lucid thought and speech. CV: RRR, S1 and S2 distant, no m/r/g.  LUNGS: CTA bilat, nonlabored resps, good aeration in all lung fields. EXT: no c/c.  Trace bilat pitting edema in both LL's.   LABS:    Chemistry      Component Value Date/Time   NA 138 10/05/2016 0930   K 4.9 10/05/2016 0930   CL 101 10/05/2016 0930   CO2 25 10/05/2016 0930   BUN 21 10/05/2016 0930   CREATININE 0.99 10/05/2016 0930   CREATININE 1.05 08/10/2016 0956      Component Value Date/Time   CALCIUM 10.7 (H) 10/05/2016 0930   ALKPHOS 81 08/27/2016 1630   AST 24 08/27/2016 1630   ALT 22 08/27/2016 1630   BILITOT 0.5 08/27/2016 1630     Lab Results  Component Value Date   WBC 7.5 08/28/2016   HGB 12.8 (L) 08/28/2016   HCT 43.1 08/28/2016   MCV 84.2 08/28/2016   PLT 175 08/28/2016   Lab Results  Component Value Date   HGBA1C 9.3 08/20/2016   Lab Results  Component Value Date   DIGOXIN 0.8 08/27/2016    IMPRESSION AND PLAN:  1) MDD, 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  Therapeutic expectations and side effect profile of medication discussed today.  Patient's questions answered.  2) Chronic hypoxemic resp failure (COPD with NYHA class II chronic combined systolic and diastolic CHF. Home oxygen need and use discussed in depth today. RA oxygen 96%. Oxygen with ambulation in office 84% on RA. Ambulation in office with 2L oxygen resulted in oxygen level stable at 96%. Will order home oxygen therapy with portable oxygen as well.  Use 2 L with ambulation.  An After Visit Summary was printed and given to the patient.  FOLLOW UP: Return for [facility] f/u appt already scheduled for 11/10/16.  Signed:  PhCrissie SicklesMD           10/09/2016

## 2016-10-13 ENCOUNTER — Other Ambulatory Visit: Payer: Self-pay | Admitting: Interventional Cardiology

## 2016-10-13 ENCOUNTER — Ambulatory Visit (INDEPENDENT_AMBULATORY_CARE_PROVIDER_SITE_OTHER): Payer: Medicare Other | Admitting: Clinical

## 2016-10-13 DIAGNOSIS — F331 Major depressive disorder, recurrent, moderate: Secondary | ICD-10-CM | POA: Diagnosis not present

## 2016-10-14 ENCOUNTER — Other Ambulatory Visit: Payer: Self-pay | Admitting: Family Medicine

## 2016-10-14 DIAGNOSIS — I5032 Chronic diastolic (congestive) heart failure: Secondary | ICD-10-CM | POA: Diagnosis not present

## 2016-10-14 DIAGNOSIS — J449 Chronic obstructive pulmonary disease, unspecified: Secondary | ICD-10-CM | POA: Diagnosis not present

## 2016-10-14 DIAGNOSIS — J9611 Chronic respiratory failure with hypoxia: Secondary | ICD-10-CM | POA: Diagnosis not present

## 2016-10-20 ENCOUNTER — Other Ambulatory Visit: Payer: Self-pay | Admitting: Endocrinology

## 2016-10-20 ENCOUNTER — Telehealth: Payer: Self-pay | Admitting: Family Medicine

## 2016-10-20 DIAGNOSIS — G3184 Mild cognitive impairment, so stated: Secondary | ICD-10-CM

## 2016-10-20 NOTE — Telephone Encounter (Signed)
Patient's wife called stating that Dr Gaynell Face recommended patient see neurologist.  Please advise.

## 2016-10-21 ENCOUNTER — Encounter: Payer: Self-pay | Admitting: Neurology

## 2016-10-21 NOTE — Telephone Encounter (Signed)
OK, I discussed specifics of the case with Dr. Gaynell Face and I have ordered neuro referral.-thx

## 2016-10-22 DIAGNOSIS — L821 Other seborrheic keratosis: Secondary | ICD-10-CM | POA: Diagnosis not present

## 2016-10-22 DIAGNOSIS — L570 Actinic keratosis: Secondary | ICD-10-CM | POA: Diagnosis not present

## 2016-10-22 DIAGNOSIS — D229 Melanocytic nevi, unspecified: Secondary | ICD-10-CM | POA: Diagnosis not present

## 2016-10-22 DIAGNOSIS — L910 Hypertrophic scar: Secondary | ICD-10-CM | POA: Diagnosis not present

## 2016-10-26 ENCOUNTER — Telehealth: Payer: Self-pay | Admitting: Family Medicine

## 2016-10-26 ENCOUNTER — Other Ambulatory Visit: Payer: Self-pay

## 2016-10-26 ENCOUNTER — Ambulatory Visit (INDEPENDENT_AMBULATORY_CARE_PROVIDER_SITE_OTHER): Payer: Medicare Other | Admitting: Endocrinology

## 2016-10-26 ENCOUNTER — Encounter: Payer: Self-pay | Admitting: Endocrinology

## 2016-10-26 VITALS — BP 118/60 | HR 65 | Ht 71.0 in | Wt 234.6 lb

## 2016-10-26 DIAGNOSIS — E1165 Type 2 diabetes mellitus with hyperglycemia: Secondary | ICD-10-CM

## 2016-10-26 DIAGNOSIS — Z794 Long term (current) use of insulin: Secondary | ICD-10-CM | POA: Diagnosis not present

## 2016-10-26 MED ORDER — INSULIN REGULAR HUMAN (CONC) 500 UNIT/ML ~~LOC~~ SOLN: SUBCUTANEOUS | 0 refills | Status: DC

## 2016-10-26 NOTE — Progress Notes (Signed)
Patient ID: MYLIK PRO, male   DOB: 1940/03/31, 76 y.o.   MRN: [id number]            Reason for Appointment:  Follow-up for Type 2 Diabetes  Referring physician: [place]   History of Present Illness:          Date of diagnosis of type 2 diabetes mellitus: ?  2002        Background history:   He apparently had been treated with metformin initially which was continued until about 2016. He previously has also been tried on Amaryl, Actos, Farxiga and Victoza but he claims that these did not help his blood sugar and were stopped Appears to have been on insulin since about 2014 but he does not remember details. He has been taking mostly Levemir insulin as a basal insulin but was also on Lantus in 2015 for some time His best A1c has been 7.3, once in 2014 and another time in 2016, otherwise his A1c has been higher and as high as 10.8  Recent history:   INSULIN regimen was:   Toujeo 50 units twice a day , Humalog 18-20-34 units before meals tid   Non-insulin hypoglycemic drugs the patient is taking are: Synjardy 05/998 twice a day  His last A1c was down to 7.3 in 5/15 and recently is back up to 9.3  Current management, blood sugar patterns and problems identified:  He was started on the V-go pump last week but because of his not having the U-500 insulin he was started on the U-100 insulin on the 40 unit pump  He has used the pump but he did not understand that he needs to continue wearing the pump when he finished his bolus clicks at suppertime  With this his blood sugars have been over 400 for the last couple of days except this morning  Last that he kept his pump on all night and blood sugar was 297 but by 11 AM and had gone up to 537  He is using 4 clicks at breakfast and lunch and 10 clicks at diner time  However he has checked his blood sugars mostly in the mornings and only a couple of readings later in the day  Also taking Synjardy twice a day  Unable to exercise because  of various physical limitations    Side effects from medications have been: None  Compliance with the medical regimen: Fair Hypoglycemia: Never    Glucose monitoring:  done 2 times a day         Glucometer:  Accu-Chek .      Blood Glucose readings with averages from download as above  Average blood sugar for the last month 284   Self-care: The diet that the patient has been following is: tries to limit Sweet drinks .     Meal times are:  Breakfast is at 7 a.m.  Dinner: 5-6 PM    Typical meal intake: Breakfast is usually eggs,  Sausage, oatmeal or grits.  Lunch is a sandwich, any evening he has meat and vegetables, For snacks he will have fruit or crackers                Dietician visit, most recent: 01/09/16 CDE visit: 12/18               Exercise: Unable to do any Because of fatigue     Weight history:  Wt Readings from Last 3 Encounters:  10/26/16 234 lb 9.6 oz (106.4 kg)  10/09/16 236 lb 12 oz (107.4 kg)  10/02/16 239 lb 3.2 oz (108.5 kg)    Glycemic control:   Lab Results  Component Value Date   HGBA1C 9.3 08/20/2016   HGBA1C 7.3 (H) 06/04/2016   HGBA1C 7.5 05/06/2016   Lab Results  Component Value Date   MICROALBUR 1.4 07/07/2016   LDLCALC 44 02/19/2016   CREATININE 0.99 10/05/2016   Lab Results  Component Value Date   MICRALBCREAT 1.2 07/07/2016    Other active problems: See review of systems   Allergies as of 10/26/2016      Reactions   Morphine And Related Other (See Comments)   Drenched with perspiration   Entresto [sacubitril-valsartan]    hypotension   Demerol [meperidine] Nausea Only   Starlix [nateglinide] Other (See Comments)   gassy      Medication List       Accurate as of 10/26/16  8:58 PM. Always use your most recent med list.          ACCU-CHEK GUIDE test strip Generic drug:  glucose blood USE TO CHECK BLOOD SUGAR 3 TIMES DAILY   acetaminophen 325 MG tablet Commonly known as:  TYLENOL Take 2 tablets (650 mg total) by  mouth every 4 (four) hours as needed for headache or mild pain.   atorvastatin 40 MG tablet Commonly known as:  LIPITOR TAKE ONE TABLET BY MOUTH EVERY DAY   bisoprolol 5 MG tablet Commonly known as:  ZEBETA Take 1 tablet (5 mg total) by mouth daily.   budesonide-formoterol 160-4.5 MCG/ACT inhaler Commonly known as:  SYMBICORT Inhale 2 puffs into the lungs 2 (two) times daily.   digoxin 0.125 MG tablet Commonly known as:  LANOXIN Take 1 tablet (0.125 mg total) by mouth daily.   fluticasone 50 MCG/ACT nasal spray Commonly known as:  FLONASE Place 2 sprays into both nostrils daily.   gabapentin 100 MG capsule Commonly known as:  NEURONTIN Take 200 mg by mouth 3 (three) times daily.   guaiFENesin 600 MG 12 hr tablet Commonly known as:  MUCINEX Take 2 tablets (1,200 mg total) by mouth 2 (two) times daily.   hyoscyamine 0.125 MG SL tablet Commonly known as:  LEVSIN SL 1-2 tabs po q6h prn diarrhea   insulin regular 100 units/mL injection Commonly known as:  NOVOLIN R,HUMULIN R Inject 22-35 Units into the skin 3 (three) times daily before meals.   insulin regular human CONCENTRATED 500 UNIT/ML injection Commonly known as:  HUMULIN R Use 56 units daily in insulin pump   isosorbide mononitrate 30 MG 24 hr tablet Commonly known as:  IMDUR TAKE ONE TABLET BY MOUTH EVERY DAY   losartan 25 MG tablet Commonly known as:  COZAAR Take 1 tablet (25 mg total) by mouth daily.   mirtazapine 30 MG tablet Commonly known as:  REMERON Take 1 tablet (30 mg total) by mouth at bedtime.   multivitamin with minerals Tabs tablet Take 1 tablet by mouth daily.   nitroGLYCERIN 0.4 MG SL tablet Commonly known as:  NITROSTAT Place 1 tablet (0.4 mg total) under the tongue every 5 (five) minutes as needed for chest pain.   potassium chloride SA 20 MEQ tablet Commonly known as:  K-DUR,KLOR-CON Take 1 tablet (20 mEq total) by mouth daily.   PROAIR HFA 108 (90 Base) MCG/ACT inhaler Generic  drug:  albuterol INHALE 2 PUFFS INTO THE LUNGS EVERY 4 HOURS AS NEEDED FOR WHEEZING ORSHORTNESS OF BREATH   rivaroxaban 20 MG Tabs tablet Commonly known as:  XARELTO Take 1 tablet (20 mg total) by mouth daily with supper.   spironolactone 25 MG tablet Commonly known as:  ALDACTONE Take 1 tablet (25 mg total) by mouth daily.   SYNJARDY 05-998 MG Tabs Generic drug:  Empagliflozin-Metformin HCl TAKE ONE TABLET BY MOUTH TWICE DAILY AFTER A MEAL   tamsulosin 0.4 MG Caps capsule Commonly known as:  FLOMAX TAKE 1 CAPSULE BY MOUTH EVERY DAY AFTER SUPPER   torsemide 20 MG tablet Commonly known as:  DEMADEX Take 20 mg by mouth daily. Takes 1 tablet daily   TOUJEO MAX SOLOSTAR 300 UNIT/ML Sopn Generic drug:  Insulin Glargine Inject 50 Units into the skin 2 (two) times daily.   vitamin C 1000 MG tablet Take 1,000 mg by mouth daily.   Vitamin D-3 1000 units Caps Take 1,000 Units by mouth daily. Reported on 02/26/2015       Allergies:  Allergies  Allergen Reactions  . Morphine And Related Other (See Comments)    Drenched with perspiration  . Entresto [Sacubitril-Valsartan]     hypotension  . Demerol [Meperidine] Nausea Only  . Starlix [Nateglinide] Other (See Comments)    gassy    Past Medical History:  Diagnosis Date  . Acute respiratory distress 06/04/2016  . Adenomatous colon polyp 10/16/2011   Repeat 2018  . Arthritis     Hips, R>L & KNEES  . CAD, multiple vessel    3V CAD cath 08/08/13----CABG done shortly after.  . 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 pylori neg, no dysplasia or malignancy.  . Chronic diastolic heart failure ([facility]) 2016  . Chronic renal insufficiency, stage 3 (moderate) (HCC) 2018   GFR 50s  . COPD (chronic obstructive pulmonary disease) ([place])    GOLD II.  Spirometry  2004 borderline obstruction; 2015 mod obst: noncompliant with bid symbicort so pulmonologist switched him to a once daily inhaler: Trelegy  ellipta 12/2015.  Oxygen prn: goal 88-92%.  . Diabetic nephropathy ([facility])    Elevated urine microalb/cr 03/2011  . DM type 2 (diabetes mellitus, type 2) ([facility])    Poor control on max oral meds--pt eventually agreed to insulin therapy.  As of 2017 his DM is being managed by Dr. Dwyane Dee in endo.  . DOE (dyspnea on exertion)    COPD + chronic diastolic HF  . Erectile dysfunction    Normal testosterone  . Hearing loss of both ears 2016   Hearing aids  . Hyperlipidemia   . Hyperplastic colon polyp 2001  . Hypertension   . Iron deficiency anemia 2014   03/2012 capsule endoscopy showed 2 AVMs--likely responsible for his IDA--lifetime iron supp recommended + q53moCBCs.  . Ischemic cardiomyopathy 2018   06/2016: EF 25-30%, global hypokinesis, grd II DD, vent septum motion changes c/w LBBB, mod MV regurg, L atr mod/sev dilat, R atr mod dilat, mod incr pulm press, Grd II DD.  . Macular degeneration, dry    Mild, bilat (Optometrist, DMayford Knifeat [employer]  . Obesity   . Open toe wound 11/2015   Wound care clinic appt made and then canceled when toe improved.  . Other and unspecified angina pectoris   . PAD (peripheral artery disease) ([place] 02/2016   Abnormal ABI's and waveforms: LE arterial duplex ordered for f/u as per cardiologist's recommendation.  Vasc eval by Dr. CBridgett Larsson impression was minimal PAD, recommended maximize med mgmt.  .Marland KitchenPAF (paroxysmal atrial fibrillation) ([place] 10/2014   xarelto  .  Pericardial effusion with cardiac tamponade 6//29/15   pericardiocentesis was done,  Infectious/inflamm (cytology showed NO MALIGNANT CELLS)  . Pleural plaque    Pleural plaques/asbestosis changes on CT chest done by pulm 05/2016.  [name] Pneumonia   . Tobacco dependence in remission    100+ pack-yr hx: quit after CABG    Past Surgical History:  Procedure Laterality Date  . APPENDECTOMY  1957  . CARDIAC CATHETERIZATION  08/08/2013  . CATARACT EXTRACTION W/ INTRAOCULAR LENS   IMPLANT, BILATERAL  04/08/2006 & 04/22/2006  . CATARACT EXTRACTION W/ INTRAOCULAR LENS  IMPLANT, BILATERAL Bilateral   . CHOLECYSTECTOMY OPEN  1999  . COLONOSCOPY  10/16/2011   Procedure: COLONOSCOPY;  Surgeon: Inda Castle, MD;  Location: [facility];  Service: Endoscopy;  Laterality: N/A;  . CORONARY ARTERY BYPASS GRAFT N/A 08/14/2013   Procedure: CORONARY ARTERY BYPASS GRAFTING (CABG);  Surgeon: Melrose Nakayama, MD;  Location: [facility];  Service: Open Heart Surgery;  Laterality: N/A;  Times 4   using left internal mammary artery and endoscopically harvested bilateral saphenous vein  . ESOPHAGOGASTRODUODENOSCOPY  02/25/12   Atrophic gastritis with a few erosions--capsule endoscopy planned as of 02/25/12 (Dr. Deatra Ina).  [name] HARDWARE REMOVAL Right 08/12/2012   Procedure: HARDWARE REMOVAL;  Surgeon: Mcarthur Rossetti, MD;  Location: [facility];  Service: Orthopedics;  Laterality: Right;  . HIP SURGERY Right 1954   Repair of slipped capital femoral epiphysis.  . INTRAOPERATIVE TRANSESOPHAGEAL ECHOCARDIOGRAM N/A 08/14/2013   Normal LV function. Procedure: INTRAOPERATIVE TRANSESOPHAGEAL ECHOCARDIOGRAM;  Surgeon: Melrose Nakayama, MD;  Location: [facility];  Service: Open Heart Surgery;  Laterality: N/A;  . LEFT HEART CATHETERIZATION WITH CORONARY ANGIOGRAM N/A 08/08/2013   Procedure: LEFT HEART CATHETERIZATION WITH CORONARY ANGIOGRAM;  Surgeon: Jettie Booze, MD;  Location: [facility] CATH LAB;  Service: Cardiovascular;  Laterality: N/A;  . PATELLA FRACTURE SURGERY Left ~ 1979   bolt + 3 screws to repair tib plateau fx  . PERICARDIAL TAP N/A 07/01/2013   Procedure: PERICARDIAL TAP;  Surgeon: Jettie Booze, MD;  Location: [facility] C[facility] LAB;  Service: Cardiovascular;  Laterality: N/A;  . RIGHT/LEFT HEART CATH AND CORONARY ANGIOGRAPHY N/A 06/10/2016   Procedure: Right/Left Heart Cath and Coronary Angiography;  Surgeon: Larey Dresser, MD;  Location: [facility] CV LAB;  Service: Cardiovascular;  Laterality: N/A;   . TESTICLE SURGERY  as a child   Undescended testicle brought down into scrotum  . TONSILLECTOMY  1947  . TOTAL HIP ARTHROPLASTY Right 08/12/2012   Procedure: REMOVAL OF OLD PINS RIGHT HIP AND RIGHT TOTAL HIP ARTHROPLASTY ANTERIOR APPROACH;  Surgeon: Mcarthur Rossetti, MD;  Location: [facility];  Service: Orthopedics;  Laterality: Right;  . TRANSTHORACIC ECHOCARDIOGRAM  10/30/14   Mod LVH, EF 60-65%, normal wall motion, mod mitral regurg, mild PAH  . TRANSTHORACIC ECHOCARDIOGRAM  06/2016   06/2016: EF 25-30%, global hypokinesis, grd II DD, vent septum motion changes c/w LBBB, mod MV regurg, L atr mod/sev dilat, R atr mod dilat, mod incr pulm press, Grd II DD.    Family History  Problem Relation Age of Onset  . Heart disease Father   . Heart attack Father   . CVA Mother   . Hypertension Mother   . Diabetes Paternal Grandmother   . Breast cancer Sister   . Diabetes Maternal Uncle   . Heart disease Maternal Uncle   . Stroke Neg Hx     Social History:  reports that he quit smoking about a year ago. His smoking  use included Cigarettes. He has a 15.50 pack-year smoking history. He quit smokeless tobacco use about 3 years ago. He reports that he does not drink alcohol or use drugs.   Review of Systems   Lipid history: He had been prescribed Lipitor 40 mg With good control    Lab Results  Component Value Date   CHOL 119 02/19/2016   HDL 43.30 02/19/2016   LDLCALC 44 02/19/2016   LDLDIRECT 187.0 11/21/2015   TRIG 159.0 (H) 02/19/2016   CHOLHDL 3 02/19/2016           Hypertension:Mild and treated with bisoprolol 5 mg,25 mg losartan  Also on diuretics for CHF Followed by cardiologist   BP Readings from Last 3 Encounters:  10/26/16 118/60  10/09/16 (!) 95/53  10/02/16 138/66     Most recent eye exam was 7/17, Dr Marina Gravel? Has had no retinopathy  Most recent foot exam: 12/17   LABS:  No visits with results within 1 Week(s) from this visit.  Latest known visit with results  is:  Lab on 10/05/2016  Component Date Value Ref Range Status  . Sodium 10/05/2016 138  135 - 145 mEq/L Final  . Potassium 10/05/2016 4.9  3.5 - 5.1 mEq/L Final  . Chloride 10/05/2016 101  96 - 112 mEq/L Final  . CO2 10/05/2016 25  19 - 32 mEq/L Final  . Glucose, Bld 10/05/2016 346* 70 - 99 mg/dL Final  . BUN 10/05/2016 21  6 - 23 mg/dL Final  . Creatinine, Ser 10/05/2016 0.99  0.40 - 1.50 mg/dL Final  . Calcium 10/05/2016 10.7* 8.4 - 10.5 mg/dL Final  . GFR 10/05/2016 77.82  >60.00 mL/min Final    Physical Examination:  BP 118/60   Pulse 65   Ht _0  (1.803 m)   Wt 234 lb 9.6 oz (106.4 kg)   SpO2 92%   BMI 32.72 kg/m       ASSESSMENT:  Diabetes type 2, uncontrolled    See history of present illness for detailed discussion of current diabetes management, blood sugar patterns and problems identified  Last A1c was over 9%  Because of his insulin resistance he is not doing well with his trial of the V-go pump using the U- 100 insulin Also did not follow instructions and was taking off the pump at night until last night However his blood sugars are still barely below 300 with this regimen and he is also not getting enough mealtime coverage during the day He is also continuing Synjardy as directed  PLAN:    He will need to Switch to the V-go 20 unit basal and use the U-500 insulin, patient information booklet given on the new insulin He'll be instructed by the nurse educator when he is able to get this insulin, needs to learn how to fill up the V-go pump with the Humulin R U-500 He will take 6 units at breakfast and lunch and 10 units at dinnertime for now for his meal boluses  Meanwhile he will go back to his Toujeo and Humalog but take Toujeo once a day starting this evening, he will stop the 40 unit pump today also Advised him to call on Friday to let us know how his blood sugars are doing  Patient Instructions  Toujeo 90 units once daily same time.,  Humalog 20-20-38  units before meals till new pump stars  U-500 with 20 Unit pump  3 clicks at Bfst and lunch and 5 at dinner  Counseling time on subjects discussed in assessment and plan sections is over 50% of today's 25 minute visit   Inge Waldroup 10/26/2016, 8:58 PM   Note: This office note was prepared with Estate agent. Any transcriptional errors that result from this process are unintentional.    [facility]

## 2016-10-26 NOTE — Patient Instructions (Addendum)
Toujeo 90 units once daily same time.,  Humalog 20-20-38 units before meals till new pump stars  U-500 with 20 Unit pump  3 clicks at Bfst and lunch and 5 at dinner

## 2016-10-26 NOTE — Telephone Encounter (Signed)
Called patient on his cell phone since the house phone was busy and left a voice message to let him know that I have sent this prescription to the [facility] in [place].

## 2016-10-26 NOTE — Telephone Encounter (Signed)
Pt called asking about a medication that was supposed to be sent to the pharmacy for them after there visit. Please advise, thank you!  [facility] [address], [place]

## 2016-10-27 ENCOUNTER — Telehealth: Payer: Self-pay | Admitting: Nutrition

## 2016-10-27 NOTE — Telephone Encounter (Signed)
Message left on machine to call me to schedule an appt. For V-go start.  Reminded him of the need to bring his U-500 insulin that he will need to get at the pharmacy before he comes.

## 2016-10-28 ENCOUNTER — Encounter: Payer: Medicare Other | Attending: Endocrinology | Admitting: Nutrition

## 2016-10-28 ENCOUNTER — Telehealth: Payer: Self-pay | Admitting: Endocrinology

## 2016-10-28 ENCOUNTER — Other Ambulatory Visit: Payer: Self-pay

## 2016-10-28 DIAGNOSIS — Z794 Long term (current) use of insulin: Secondary | ICD-10-CM | POA: Insufficient documentation

## 2016-10-28 DIAGNOSIS — E1165 Type 2 diabetes mellitus with hyperglycemia: Secondary | ICD-10-CM | POA: Diagnosis not present

## 2016-10-28 DIAGNOSIS — E118 Type 2 diabetes mellitus with unspecified complications: Secondary | ICD-10-CM

## 2016-10-28 MED ORDER — "INSULIN SYRINGE-NEEDLE U-100 30G X 1/2"" 1 ML MISC": 2 refills | Status: DC

## 2016-10-28 NOTE — Telephone Encounter (Signed)
Appointment scheduled for today

## 2016-10-28 NOTE — Telephone Encounter (Signed)
Patient needs help with learning how to administer the new 500 medication with 20 unit pump. Please advise ph# [phone]

## 2016-10-28 NOTE — Telephone Encounter (Signed)
Patient wife asked you to called patient prescriptions to [facility] for the u 500 vg 20 and other prescription she didn't know the name of.

## 2016-10-28 NOTE — Telephone Encounter (Signed)
Joshua [name], can you help this patient? Thank you!

## 2016-10-28 NOTE — Telephone Encounter (Signed)
Called patient and let him know that I have already sent in the prescription for the needles.

## 2016-10-28 NOTE — Telephone Encounter (Signed)
Caller Name:[name]  Relationship to Patient:spouse  Best [place]  Reason for call: Spouse called and wanted to schedule with Vaughan Basta.  Best number is [phone].  (I had already started phone note when Mardene Celeste informed me to give number to have patient call Leonia Reader directly at [phone])  Patient will call to schedule

## 2016-11-01 ENCOUNTER — Encounter: Payer: Self-pay | Admitting: Family Medicine

## 2016-11-02 ENCOUNTER — Ambulatory Visit (INDEPENDENT_AMBULATORY_CARE_PROVIDER_SITE_OTHER): Payer: Medicare Other | Admitting: Clinical

## 2016-11-02 DIAGNOSIS — F331 Major depressive disorder, recurrent, moderate: Secondary | ICD-10-CM | POA: Diagnosis not present

## 2016-11-04 NOTE — Progress Notes (Signed)
Pt. Was trained on how t fill the V-go 20 using R u-500 insulin.  He redemonstrated this correctly X2, and applied one to his upper left abdomen with no difficulty.  We reviewed the need to fill this every day at the same time and to do 3 clicks at breakfast and lunch and 5 clicks at supper.  We discussed the U-500 R insulin, how it works and what the dosage will be.   He is aware that each click is 26R of R insulin, and that he needs to wait 30 min. After the clicks before eating his meals.  He reported good undersanding of this with no questions.

## 2016-11-04 NOTE — Patient Instructions (Signed)
Fill and apply a new V-go every 24 hours.  Give 3 clicks before breakfast and lunch, and 5 clicks before supper. Test blood sugar before meals and at bedtime Call if blood sugars remain over 200, or drop below 75.

## 2016-11-10 ENCOUNTER — Encounter: Payer: Self-pay | Admitting: Family Medicine

## 2016-11-10 ENCOUNTER — Ambulatory Visit (INDEPENDENT_AMBULATORY_CARE_PROVIDER_SITE_OTHER): Payer: Medicare Other | Admitting: Family Medicine

## 2016-11-10 VITALS — BP 106/57 | HR 65 | Temp 98.1°F | Resp 16 | Ht 71.0 in | Wt 238.0 lb

## 2016-11-10 DIAGNOSIS — J9611 Chronic respiratory failure with hypoxia: Secondary | ICD-10-CM

## 2016-11-10 DIAGNOSIS — F321 Major depressive disorder, single episode, moderate: Secondary | ICD-10-CM | POA: Diagnosis not present

## 2016-11-10 MED ORDER — MIRTAZAPINE 45 MG PO TABS: 45.0000 mg | ORAL_TABLET | Freq: Every day | ORAL | 1 refills | Status: DC

## 2016-11-10 NOTE — Progress Notes (Signed)
OFFICE VISIT  11/10/2016   CC:  Chief Complaint  Patient presents with  . Follow-up    RCI, pt is not fasting.   HPI:    Patient is a 76 y.o. Caucasian male who presents accompanied by his wife for 1 mo f/u MDD and chronic hypoxemic respiratory failure due to COPD and chronic combined systolic and diastolic HF. Last visit I increased his remeron to 30 mg qd and he was to start counseling with Dr. Gaynell Face.  I made no other changes to his med regimen, but we did arrange for home oxygen.  Pt feels not much change but wife says he notes some improvement. Two counseling sessions and pt has decided to stop b/c "I didn't feel like it was helping.  Feels no change in resp status: stable DOE with moderate activity. Very little use/need for oxygen at home: 93% on RA at rest at home this morning.  Past Medical History:  Diagnosis Date  . Acute respiratory distress 06/04/2016  . Adenomatous colon polyp 10/16/2011   Repeat 2018  . Arthritis     Hips, R>L & KNEES  . CAD, multiple vessel    3V CAD cath 08/08/13----CABG done shortly after.  . 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 pylori neg, no dysplasia or malignancy.  . Chronic diastolic heart failure ([place]) 2016  . Chronic renal insufficiency, stage 3 (moderate) (HCC) 2018   GFR 50s  . COPD (chronic obstructive pulmonary disease) ([place])    GOLD II.  Spirometry  2004 borderline obstruction; 2015 mod obst: noncompliant with bid symbicort so pulmonologist switched him to a once daily inhaler: Trelegy ellipta 12/2015.  Oxygen prn: goal 88-92%.  . Diabetic nephropathy ([place])    Elevated urine microalb/cr 03/2011  . DM type 2 (diabetes mellitus, type 2) ([place])    Poor control on max oral meds--pt eventually agreed to insulin therapy.  As of 2017 his DM is being managed by Dr. Dwyane Dee in endo.  . DOE (dyspnea on exertion)    COPD + chronic diastolic HF  . Erectile dysfunction    Normal testosterone  . Hearing  loss of both ears 2016   Hearing aids  . Hyperlipidemia   . Hyperplastic colon polyp 2001  . Hypertension   . Iron deficiency anemia 2014   03/2012 capsule endoscopy showed 2 AVMs--likely responsible for his IDA--lifetime iron supp recommended + q75moCBCs.  . Ischemic cardiomyopathy 2018   06/2016: EF 25-30%, global hypokinesis, grd II DD, vent septum motion changes c/w LBBB, mod MV regurg, L atr mod/sev dilat, R atr mod dilat, mod incr pulm press, Grd II DD.  . Macular degeneration, dry    Mild, bilat (Optometrist, DMayford Knifeat [employer] [place]  . Obesity   . Open toe wound 11/2015   Wound care clinic appt made and then canceled when toe improved.  . Other and unspecified angina pectoris   . PAD (peripheral artery disease) ([place] 02/2016   Abnormal ABI's and waveforms: LE arterial duplex ordered for f/u as per cardiologist's recommendation.  Vasc eval by Dr. CBridgett Larsson impression was minimal PAD, recommended maximize med mgmt.  .Marland KitchenPAF (paroxysmal atrial fibrillation) ([place] 10/2014   xarelto  . Pericardial effusion with cardiac tamponade 6//29/15   pericardiocentesis was done,  Infectious/inflamm (cytology showed NO MALIGNANT CELLS)  . Pleural plaque    Pleural plaques/asbestosis changes on CT chest done by pulm 05/2016.  .Marland KitchenPneumonia   . Tobacco dependence  in remission    100+ pack-yr hx: quit after CABG    Past Surgical History:  Procedure Laterality Date  . APPENDECTOMY  1957  . CARDIAC CATHETERIZATION  08/08/2013  . CATARACT EXTRACTION W/ INTRAOCULAR LENS  IMPLANT, BILATERAL  04/08/2006 & 04/22/2006  . CATARACT EXTRACTION W/ INTRAOCULAR LENS  IMPLANT, BILATERAL Bilateral   . CHOLECYSTECTOMY OPEN  1999  . ESOPHAGOGASTRODUODENOSCOPY  02/25/12   Atrophic gastritis with a few erosions--capsule endoscopy planned as of 02/25/12 (Dr. Deatra Ina).  [name] HIP SURGERY Right 1954   Repair of slipped capital femoral epiphysis.  [name] PATELLA FRACTURE SURGERY Left ~ 1979   bolt + 3 screws  to repair tib plateau fx  . TESTICLE SURGERY  as a child   Undescended testicle brought down into scrotum  . TONSILLECTOMY  1947  . TRANSESOPHAGEAL ECHOCARDIOGRAM  09/22/2016   EF 40-45%; diffuse hypokinesis, Grd I DD.  [name] TRANSTHORACIC ECHOCARDIOGRAM  10/30/14   Mod LVH, EF 60-65%, normal wall motion, mod mitral regurg, mild PAH  . TRANSTHORACIC ECHOCARDIOGRAM  06/2016   06/2016: EF 25-30%, global hypokinesis, grd II DD, vent septum motion changes c/w LBBB, mod MV regurg, L atr mod/sev dilat, R atr mod dilat, mod incr pulm press, Grd II DD.    Outpatient Medications Prior to Visit  Medication Sig Dispense Refill  . ACCU-CHEK GUIDE test strip USE TO CHECK BLOOD SUGAR 3 TIMES DAILY 100 each 11  . acetaminophen (TYLENOL) 325 MG tablet Take 2 tablets (650 mg total) by mouth every 4 (four) hours as needed for headache or mild pain.    . Ascorbic Acid (VITAMIN C) 1000 MG tablet Take 1,000 mg by mouth daily.    [name] atorvastatin (LIPITOR) 40 MG tablet TAKE ONE TABLET BY MOUTH EVERY DAY 90 tablet 1  . bisoprolol (ZEBETA) 5 MG tablet Take 1 tablet (5 mg total) by mouth daily. 30 tablet 6  . budesonide-formoterol (SYMBICORT) 160-4.5 MCG/ACT inhaler Inhale 2 puffs into the lungs 2 (two) times daily. 1 Inhaler 12  . Cholecalciferol (VITAMIN D-3) 1000 units CAPS Take 1,000 Units by mouth daily. Reported on 02/26/2015    . digoxin (LANOXIN) 0.125 MG tablet Take 1 tablet (0.125 mg total) by mouth daily. 30 tablet 6  . fluticasone (FLONASE) 50 MCG/ACT nasal spray Place 2 sprays into both nostrils daily. 16 g 1  . gabapentin (NEURONTIN) 100 MG capsule Take 200 mg by mouth 3 (three) times daily.    [name] guaiFENesin (MUCINEX) 600 MG 12 hr tablet Take 2 tablets (1,200 mg total) by mouth 2 (two) times daily. 30 tablet 0  . hyoscyamine (LEVSIN SL) 0.125 MG SL tablet 1-2 tabs po q6h prn diarrhea 30 tablet 1  . Insulin Glargine (TOUJEO MAX SOLOSTAR) 300 UNIT/ML SOPN Inject 50 Units into the skin 2 (two) times daily.    .  insulin regular human CONCENTRATED (HUMULIN R) 500 UNIT/ML injection Use 56 units daily in insulin pump 20 mL 0  . Insulin Syringe-Needle U-100 30G X 1/2" 1 ML MISC Use to fill insulin VGo pump one time daily 100 each 2  . isosorbide mononitrate (IMDUR) 30 MG 24 hr tablet TAKE ONE TABLET BY MOUTH EVERY DAY 90 tablet 1  . losartan (COZAAR) 25 MG tablet Take 1 tablet (25 mg total) by mouth daily. 45 tablet 3  . Multiple Vitamin (MULTIVITAMIN WITH MINERALS) TABS Take 1 tablet by mouth daily.    . nitroGLYCERIN (NITROSTAT) 0.4 MG SL tablet Place 1 tablet (0.4 mg total) under the tongue  every 5 (five) minutes as needed for chest pain. 25 tablet 3  . potassium chloride SA (K-DUR,KLOR-CON) 20 MEQ tablet Take 1 tablet (20 mEq total) by mouth daily. 30 tablet 6  . PROAIR HFA 108 (90 Base) MCG/ACT inhaler INHALE 2 PUFFS INTO THE LUNGS EVERY 4 HOURS AS NEEDED FOR WHEEZING ORSHORTNESS OF BREATH 8.5 g 1  . rivaroxaban (XARELTO) 20 MG TABS tablet Take 1 tablet (20 mg total) by mouth daily with supper. 90 tablet 3  . spironolactone (ALDACTONE) 25 MG tablet Take 1 tablet (25 mg total) by mouth daily. 30 tablet 6  . SYNJARDY 05-998 MG TABS TAKE ONE TABLET BY MOUTH TWICE DAILY AFTER A MEAL 60 tablet 2  . tamsulosin (FLOMAX) 0.4 MG CAPS capsule TAKE 1 CAPSULE BY MOUTH EVERY DAY AFTER SUPPER 90 capsule 1  . torsemide (DEMADEX) 20 MG tablet Take 20 mg by mouth daily. Takes 1 tablet daily    . mirtazapine (REMERON) 30 MG tablet Take 1 tablet (30 mg total) by mouth at bedtime. 30 tablet 1  . insulin regular (NOVOLIN R,HUMULIN R) 100 units/mL injection Inject 22-35 Units into the skin 3 (three) times daily before meals.      No facility-administered medications prior to visit.     Allergies  Allergen Reactions  . Morphine And Related Other (See Comments)    Drenched with perspiration  . Entresto [Sacubitril-Valsartan]     hypotension  . Demerol [Meperidine] Nausea Only  . Starlix [Nateglinide] Other (See Comments)     gassy    ROS As per HPI  PE: Blood pressure (!) 106/57, pulse 65, temperature 98.1 F (36.7 C), temperature source Oral, resp. rate 16, height 5' 11" (1.803 m), weight 238 lb (108 kg), SpO2 95 %. Gen: Alert, well appearing.  Patient is oriented to person, place, time, and situation. AFFECT: pleasant, lucid thought and speech. CV: RRR, distant S1 and S2, no audible murmur.  No r/g. Chest is clear, no wheezing or rales. Normal symmetric air entry throughout both lung fields. No chest wall deformities or tenderness. EXT: trace bilat LL edema  LABS:    Chemistry      Component Value Date/Time   NA 138 10/05/2016 0930   K 4.9 10/05/2016 0930   CL 101 10/05/2016 0930   CO2 25 10/05/2016 0930   BUN 21 10/05/2016 0930   CREATININE 0.99 10/05/2016 0930   CREATININE 1.05 08/10/2016 0956      Component Value Date/Time   CALCIUM 10.7 (H) 10/05/2016 0930   ALKPHOS 81 08/27/2016 1630   AST 24 08/27/2016 1630   ALT 22 08/27/2016 1630   BILITOT 0.5 08/27/2016 1630       IMPRESSION AND PLAN:  1) MDD, minimal improvement as we've been up-titrating remeron. Will increase remeron to max dose of 35m qhs and I encouraged him to re-establish counseling with Dr. MGaynell Face  He will think about it.  2) Chronic hypoxic resp failure: stable at this time. Repeat BMET at next f/u visit.  An After Visit Summary was printed and given to the patient.  FOLLOW UP: Return in about 4 weeks (around 12/08/2016) for f/u dep and resp.  Signed:  PCrissie Sickles MD           11/10/2016

## 2016-11-11 ENCOUNTER — Encounter: Payer: Self-pay | Admitting: Endocrinology

## 2016-11-11 ENCOUNTER — Ambulatory Visit: Payer: Medicare Other | Admitting: Endocrinology

## 2016-11-11 ENCOUNTER — Other Ambulatory Visit: Payer: Self-pay

## 2016-11-11 VITALS — BP 104/68 | HR 89 | Ht 71.0 in | Wt 237.2 lb

## 2016-11-11 DIAGNOSIS — E1165 Type 2 diabetes mellitus with hyperglycemia: Secondary | ICD-10-CM | POA: Diagnosis not present

## 2016-11-11 DIAGNOSIS — Z794 Long term (current) use of insulin: Secondary | ICD-10-CM | POA: Diagnosis not present

## 2016-11-11 LAB — GLUCOSE, POCT (MANUAL RESULT ENTRY): POC GLUCOSE: 150 mg/dL — AB (ref 70–99)

## 2016-11-11 MED ORDER — V-GO 20 KIT: PACK | 3 refills | Status: DC

## 2016-11-11 NOTE — Patient Instructions (Addendum)
Do the clicks 30 min before meals   3 clicks in am and 4 at lunch with 3-5 at dinner based on meal size   More sugars after meals

## 2016-11-11 NOTE — Progress Notes (Signed)
Patient ID: Joshua [name], male   DOB: Aug 11, 1940, 76 y.o.   MRN: [id number]            Reason for Appointment:  Follow-up for Type 2 Diabetes  Referring physician: [place]   History of Present Illness:          Date of diagnosis of type 2 diabetes mellitus: ?  2002        Background history:   He apparently had been treated with metformin initially which was continued until about 2016. He previously has also been tried on Amaryl, Actos, Farxiga and Victoza but he claims that these did not help his blood sugar and were stopped Appears to have been on insulin since about 2014 but he does not remember details. He has been taking mostly Levemir insulin as a basal insulin but was also on Lantus in 2015 for some time His best A1c has been 7.3, once in 2014 and another time in 2016, otherwise his A1c has been higher and as high as 10.8  Recent history:   INSULIN regimen was:   Non-insulin hypoglycemic drugs the patient is taking are: Synjardy 05/998 twice a day  His last A1c was down to 7.3 in May and was last back up to 9.3  Current management, blood sugar patterns and problems identified:  He was started on the V-go pump last month but his blood sugars were poorly controlled with the 40 unit and the U-100 insulin  For the last 2 weeks or so he has been not using the U-500 insulin and a 20 unit basal  Although previously blood sugars were over frequently over 400 to near normal in the mornings recently  Also no overnight hypoglycemia with lowest blood sugar 87  He is however not checking readings after meals much at all and has only a few readings in the early afternoon which are mostly over 200 now  Has only a couple of readings after supper which are not consistent, the only good reading was 126 after eating popcorn and not a meal  BOLUSES: He is supposed to take 3 clicks before breakfast and lunch and 5 clicks before dinnertime and he thinks he is doing this  However he is  frequently forgetting to do this before eating and may do this after eating including this morning  However this morning his blood sugar is about 150 in the office about 3-4 hours after his breakfast  Also taking Synjardy twice a day  Unable to exercise because of various physical limitations  His weight is up a couple of pounds    Side effects from medications have been: None  Compliance with the medical regimen: Fair Hypoglycemia: Never    Glucose monitoring:  done 2 times a day         Glucometer:  Accu-Chek .      Blood Glucose readings with averages from download as above  FASTING average 211 for the last month and overall average 250  Self-care: The diet that the patient has been following is: tries to limit Sweet drinks .     Meal times are:  Breakfast is at 7 a.m.  Dinner: 5-6 PM    Typical meal intake: Breakfast is usually eggs,  Sausage, oatmeal or grits.  Lunch is a sandwich, any evening he has meat and vegetables, For snacks he will have fruit or crackers                Dietician visit, most recent:  01/09/16 CDE visit: 12/18               Exercise: Unable to do any Because of fatigue     Weight history:  Wt Readings from Last 3 Encounters:  11/11/16 237 lb 3.2 oz (107.6 kg)  11/10/16 238 lb (108 kg)  10/26/16 234 lb 9.6 oz (106.4 kg)    Glycemic control:   Lab Results  Component Value Date   HGBA1C 9.3 08/20/2016   HGBA1C 7.3 (H) 06/04/2016   HGBA1C 7.5 05/06/2016   Lab Results  Component Value Date   MICROALBUR 1.4 07/07/2016   LDLCALC 44 02/19/2016   CREATININE 0.99 10/05/2016   Lab Results  Component Value Date   MICRALBCREAT 1.2 07/07/2016    Other active problems: See review of systems   Allergies as of 11/11/2016      Reactions   Morphine And Related Other (See Comments)   Drenched with perspiration   Entresto [sacubitril-valsartan]    hypotension   Demerol [meperidine] Nausea Only   Starlix [nateglinide] Other (See Comments)   gassy       Medication List        Accurate as of 11/11/16  8:58 PM. Always use your most recent med list.          ACCU-CHEK GUIDE test strip Generic drug:  glucose blood USE TO CHECK BLOOD SUGAR 3 TIMES DAILY   acetaminophen 325 MG tablet Commonly known as:  TYLENOL Take 2 tablets (650 mg total) by mouth every 4 (four) hours as needed for headache or mild pain.   atorvastatin 40 MG tablet Commonly known as:  LIPITOR TAKE ONE TABLET BY MOUTH EVERY DAY   bisoprolol 5 MG tablet Commonly known as:  ZEBETA Take 1 tablet (5 mg total) by mouth daily.   budesonide-formoterol 160-4.5 MCG/ACT inhaler Commonly known as:  SYMBICORT Inhale 2 puffs into the lungs 2 (two) times daily.   digoxin 0.125 MG tablet Commonly known as:  LANOXIN Take 1 tablet (0.125 mg total) by mouth daily.   fluticasone 50 MCG/ACT nasal spray Commonly known as:  FLONASE Place 2 sprays into both nostrils daily.   gabapentin 100 MG capsule Commonly known as:  NEURONTIN Take 200 mg by mouth 3 (three) times daily.   guaiFENesin 600 MG 12 hr tablet Commonly known as:  MUCINEX Take 2 tablets (1,200 mg total) by mouth 2 (two) times daily.   hyoscyamine 0.125 MG SL tablet Commonly known as:  LEVSIN SL 1-2 tabs po q6h prn diarrhea   insulin regular human CONCENTRATED 500 UNIT/ML injection Commonly known as:  HUMULIN R Use 56 units daily in insulin pump   Insulin Syringe-Needle U-100 30G X 1/2" 1 ML Misc Use to fill insulin VGo pump one time daily   isosorbide mononitrate 30 MG 24 hr tablet Commonly known as:  IMDUR TAKE ONE TABLET BY MOUTH EVERY DAY   losartan 25 MG tablet Commonly known as:  COZAAR Take 1 tablet (25 mg total) by mouth daily.   mirtazapine 45 MG tablet Commonly known as:  REMERON Take 1 tablet (45 mg total) at bedtime by mouth.   multivitamin with minerals Tabs tablet Take 1 tablet by mouth daily.   nitroGLYCERIN 0.4 MG SL tablet Commonly known as:  NITROSTAT Place 1 tablet  (0.4 mg total) under the tongue every 5 (five) minutes as needed for chest pain.   potassium chloride SA 20 MEQ tablet Commonly known as:  K-DUR,KLOR-CON Take 1 tablet (20 mEq total) by  mouth daily.   PROAIR HFA 108 (90 Base) MCG/ACT inhaler Generic drug:  albuterol INHALE 2 PUFFS INTO THE LUNGS EVERY 4 HOURS AS NEEDED FOR WHEEZING ORSHORTNESS OF BREATH   rivaroxaban 20 MG Tabs tablet Commonly known as:  XARELTO Take 1 tablet (20 mg total) by mouth daily with supper.   spironolactone 25 MG tablet Commonly known as:  ALDACTONE Take 1 tablet (25 mg total) by mouth daily.   SYNJARDY 05-998 MG Tabs Generic drug:  Empagliflozin-Metformin HCl TAKE ONE TABLET BY MOUTH TWICE DAILY AFTER A MEAL   tamsulosin 0.4 MG Caps capsule Commonly known as:  FLOMAX TAKE 1 CAPSULE BY MOUTH EVERY DAY AFTER SUPPER   torsemide 20 MG tablet Commonly known as:  DEMADEX Take 20 mg by mouth daily. Takes 1 tablet daily   TOUJEO MAX SOLOSTAR 300 UNIT/ML Sopn Generic drug:  Insulin Glargine Inject 50 Units into the skin 2 (two) times daily.   V-GO 20 Kit Apply one Vgo 20 pump daily.   vitamin C 1000 MG tablet Take 1,000 mg by mouth daily.   Vitamin D-3 1000 units Caps Take 1,000 Units by mouth daily. Reported on 02/26/2015       Allergies:  Allergies  Allergen Reactions  . Morphine And Related Other (See Comments)    Drenched with perspiration  . Entresto [Sacubitril-Valsartan]     hypotension  . Demerol [Meperidine] Nausea Only  . Starlix [Nateglinide] Other (See Comments)    gassy    Past Medical History:  Diagnosis Date  . Acute respiratory distress 06/04/2016  . Adenomatous colon polyp 10/16/2011   Repeat 2018  . Arthritis     Hips, R>L & KNEES  . CAD, multiple vessel    3V CAD cath 08/08/13----CABG done shortly after.  . 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 pylori neg, no dysplasia or malignancy.  . Chronic diastolic heart failure ([place])  2016  . Chronic renal insufficiency, stage 3 (moderate) (HCC) 2018   GFR 50s  . COPD (chronic obstructive pulmonary disease) ([place])    GOLD II.  Spirometry  2004 borderline obstruction; 2015 mod obst: noncompliant with bid symbicort so pulmonologist switched him to a once daily inhaler: Trelegy ellipta 12/2015.  Oxygen prn: goal 88-92%.  . Diabetic nephropathy ([place])    Elevated urine microalb/cr 03/2011  . DM type 2 (diabetes mellitus, type 2) ([place])    Poor control on max oral meds--pt eventually agreed to insulin therapy.  As of 2017 his DM is being managed by Dr. Dwyane Dee in endo.  . DOE (dyspnea on exertion)    COPD + chronic diastolic HF  . Erectile dysfunction    Normal testosterone  . Hearing loss of both ears 2016   Hearing aids  . Hyperlipidemia   . Hyperplastic colon polyp 2001  . Hypertension   . Iron deficiency anemia 2014   03/2012 capsule endoscopy showed 2 AVMs--likely responsible for his IDA--lifetime iron supp recommended + q65moCBCs.  . Ischemic cardiomyopathy 2018   06/2016: EF 25-30%, global hypokinesis, grd II DD, vent septum motion changes c/w LBBB, mod MV regurg, L atr mod/sev dilat, R atr mod dilat, mod incr pulm press, Grd II DD.  . Macular degeneration, dry    Mild, bilat (Optometrist, DMayford Knifeat [employer] [place]  . Obesity   . Open toe wound 11/2015   Wound care clinic appt made and then canceled when toe improved.  . Other and unspecified angina  pectoris   . PAD (peripheral artery disease) ([place]) 02/2016   Abnormal ABI's and waveforms: LE arterial duplex ordered for f/u as per cardiologist's recommendation.  Vasc eval by Dr. Bridgett Larsson: impression was minimal PAD, recommended maximize med mgmt.  [name] PAF (paroxysmal atrial fibrillation) ([place]) 10/2014   xarelto  . Pericardial effusion with cardiac tamponade 6//29/15   pericardiocentesis was done,  Infectious/inflamm (cytology showed NO MALIGNANT CELLS)  . Pleural plaque    Pleural  plaques/asbestosis changes on CT chest done by pulm 05/2016.  [name] Pneumonia   . Tobacco dependence in remission    100+ pack-yr hx: quit after CABG    Past Surgical History:  Procedure Laterality Date  . APPENDECTOMY  1957  . CARDIAC CATHETERIZATION  08/08/2013  . CATARACT EXTRACTION W/ INTRAOCULAR LENS  IMPLANT, BILATERAL  04/08/2006 & 04/22/2006  . CATARACT EXTRACTION W/ INTRAOCULAR LENS  IMPLANT, BILATERAL Bilateral   . CHOLECYSTECTOMY OPEN  1999  . ESOPHAGOGASTRODUODENOSCOPY  02/25/12   Atrophic gastritis with a few erosions--capsule endoscopy planned as of 02/25/12 (Dr. Deatra Ina).  [name] HIP SURGERY Right 1954   Repair of slipped capital femoral epiphysis.  [name] PATELLA FRACTURE SURGERY Left ~ 1979   bolt + 3 screws to repair tib plateau fx  . TESTICLE SURGERY  as a child   Undescended testicle brought down into scrotum  . TONSILLECTOMY  1947  . TRANSESOPHAGEAL ECHOCARDIOGRAM  09/22/2016   EF 40-45%; diffuse hypokinesis, Grd I DD.  [name] TRANSTHORACIC ECHOCARDIOGRAM  10/30/14   Mod LVH, EF 60-65%, normal wall motion, mod mitral regurg, mild PAH  . TRANSTHORACIC ECHOCARDIOGRAM  06/2016   06/2016: EF 25-30%, global hypokinesis, grd II DD, vent septum motion changes c/w LBBB, mod MV regurg, L atr mod/sev dilat, R atr mod dilat, mod incr pulm press, Grd II DD.    Family History  Problem Relation Age of Onset  . Heart disease Father   . Heart attack Father   . CVA Mother   . Hypertension Mother   . Diabetes Paternal Grandmother   . Breast cancer Sister   . Diabetes Maternal Uncle   . Heart disease Maternal Uncle   . Stroke Neg Hx     Social History:  reports that he quit smoking about 12 months ago. His smoking use included cigarettes. He has a 15.50 pack-year smoking history. He quit smokeless tobacco use about 3 years ago. He reports that he does not drink alcohol or use drugs.   Review of Systems   Lipid history: He had been prescribed Lipitor 40 mg With good control    Lab Results    Component Value Date   CHOL 119 02/19/2016   HDL 43.30 02/19/2016   LDLCALC 44 02/19/2016   LDLDIRECT 187.0 11/21/2015   TRIG 159.0 (H) 02/19/2016   CHOLHDL 3 02/19/2016           Hypertension:Mild and treated with bisoprolol 5 mg,25 mg losartan  Also on diuretics for CHF Followed by cardiologist   BP Readings from Last 3 Encounters:  11/11/16 104/68  11/10/16 (!) 106/57  10/26/16 118/60     Most recent eye exam was 7/17, Dr Marina Gravel? Has had no retinopathy  Most recent foot exam: 12/17   LABS:  Office Visit on 11/11/2016  Component Date Value Ref Range Status  . POC Glucose 11/11/2016 150* 70 - 99 mg/dl Final    Physical Examination:  BP 104/68   Pulse 89   Ht _0  (1.803 m)   Wt 237 lb  3.2 oz (107.6 kg)   SpO2 94%   BMI 33.08 kg/m       ASSESSMENT:  Diabetes type 2, uncontrolled    See history of present illness for detailed discussion of current diabetes management, blood sugar patterns and problems identified  Last A1c was over 9%  His blood sugars have dramatically improved with using the U-500 insulin in the 20 units basal V-go pump Fasting readings are fairly consistently near normal without hypoglycemia Currently appears to be having mostly high readings after meals but still better than before   PLAN:   He will stay on the 20 unit basal on the V-go pump Discussed site rotation and where he can place the pump Emphasized the need to bolus 30 minutes before eating every meal He will need to at least go one click for his lunchtime coverage and possibly more in the morning if that sugars are consistently high at lunchtime also Most likely he can reduce the coverage for dinnertime if eating a smaller meal However will need to adjust his boluses based on postprandial readings which he needs to do much more than in the morning before breakfast and this was emphasized Also discussed that he will need to let us know if he has any hypoglycemia Continue to  monitor renal function and electrolytes with keeping him on Synjardy   Patient Instructions  Do the clicks 30 min before meals   3 clicks in am and 4 at lunch with 3-5 at dinner based on meal size   More sugars after meals   Counseling time on subjects discussed in assessment and plan sections is over 50% of today's 25 minute visit   Duffy Dantonio 11/11/2016, 8:58 PM   Note: This office note was prepared with Dragon voice recognition system technology. Any transcriptional errors that result from this process are unintentional.    [facility]

## 2016-11-11 NOTE — Telephone Encounter (Signed)
Called patient and spoke to his wife and let her know that I have sent the VGo 20 kit to [facility] for them and to check to make sure it is ready before they go pick it up.

## 2016-11-11 NOTE — Telephone Encounter (Signed)
[facility] called stated the haven't received medication for patient Joshua [name] please advise

## 2016-11-14 DIAGNOSIS — J9611 Chronic respiratory failure with hypoxia: Secondary | ICD-10-CM | POA: Diagnosis not present

## 2016-11-14 DIAGNOSIS — J449 Chronic obstructive pulmonary disease, unspecified: Secondary | ICD-10-CM | POA: Diagnosis not present

## 2016-11-14 DIAGNOSIS — I5032 Chronic diastolic (congestive) heart failure: Secondary | ICD-10-CM | POA: Diagnosis not present

## 2016-11-18 ENCOUNTER — Ambulatory Visit: Payer: Self-pay | Admitting: Endocrinology

## 2016-11-23 ENCOUNTER — Encounter (HOSPITAL_COMMUNITY): Payer: Self-pay | Admitting: Cardiology

## 2016-11-23 ENCOUNTER — Ambulatory Visit (HOSPITAL_COMMUNITY)
Admission: RE | Admit: 2016-11-23 | Discharge: 2016-11-23 | Disposition: A | Discharge status: Home / Self Care | Payer: Medicare Other | Source: Ambulatory Visit | Attending: Cardiology | Admitting: Cardiology

## 2016-11-23 ENCOUNTER — Other Ambulatory Visit: Payer: Self-pay | Admitting: Family Medicine

## 2016-11-23 VITALS — BP 152/70 | HR 63 | Wt 237.8 lb

## 2016-11-23 DIAGNOSIS — Z803 Family history of malignant neoplasm of breast: Secondary | ICD-10-CM | POA: Diagnosis not present

## 2016-11-23 DIAGNOSIS — I110 Hypertensive heart disease with heart failure: Secondary | ICD-10-CM | POA: Insufficient documentation

## 2016-11-23 DIAGNOSIS — Z833 Family history of diabetes mellitus: Secondary | ICD-10-CM | POA: Insufficient documentation

## 2016-11-23 DIAGNOSIS — Z8249 Family history of ischemic heart disease and other diseases of the circulatory system: Secondary | ICD-10-CM | POA: Diagnosis not present

## 2016-11-23 DIAGNOSIS — I255 Ischemic cardiomyopathy: Secondary | ICD-10-CM | POA: Diagnosis not present

## 2016-11-23 DIAGNOSIS — E119 Type 2 diabetes mellitus without complications: Secondary | ICD-10-CM | POA: Diagnosis not present

## 2016-11-23 DIAGNOSIS — Z7901 Long term (current) use of anticoagulants: Secondary | ICD-10-CM | POA: Insufficient documentation

## 2016-11-23 DIAGNOSIS — I2510 Atherosclerotic heart disease of native coronary artery without angina pectoris: Secondary | ICD-10-CM | POA: Diagnosis not present

## 2016-11-23 DIAGNOSIS — Z823 Family history of stroke: Secondary | ICD-10-CM | POA: Diagnosis not present

## 2016-11-23 DIAGNOSIS — I5022 Chronic systolic (congestive) heart failure: Secondary | ICD-10-CM | POA: Insufficient documentation

## 2016-11-23 DIAGNOSIS — Z951 Presence of aortocoronary bypass graft: Secondary | ICD-10-CM | POA: Insufficient documentation

## 2016-11-23 DIAGNOSIS — Z87891 Personal history of nicotine dependence: Secondary | ICD-10-CM | POA: Insufficient documentation

## 2016-11-23 DIAGNOSIS — Z79899 Other long term (current) drug therapy: Secondary | ICD-10-CM | POA: Insufficient documentation

## 2016-11-23 DIAGNOSIS — I480 Paroxysmal atrial fibrillation: Secondary | ICD-10-CM | POA: Insufficient documentation

## 2016-11-23 DIAGNOSIS — E785 Hyperlipidemia, unspecified: Secondary | ICD-10-CM | POA: Insufficient documentation

## 2016-11-23 DIAGNOSIS — J449 Chronic obstructive pulmonary disease, unspecified: Secondary | ICD-10-CM | POA: Diagnosis not present

## 2016-11-23 DIAGNOSIS — Z794 Long term (current) use of insulin: Secondary | ICD-10-CM | POA: Diagnosis not present

## 2016-11-23 DIAGNOSIS — Z888 Allergy status to other drugs, medicaments and biological substances status: Secondary | ICD-10-CM | POA: Insufficient documentation

## 2016-11-23 LAB — BASIC METABOLIC PANEL
ANION GAP: 8 (ref 5–15)
BUN: 19 mg/dL (ref 6–20)
CHLORIDE: 106 mmol/L (ref 101–111)
CO2: 26 mmol/L (ref 22–32)
Calcium: 9.6 mg/dL (ref 8.9–10.3)
Creatinine, Ser: 1.04 mg/dL (ref 0.61–1.24)
GFR calc Af Amer: >60 mL/min (ref >60)
GLUCOSE: 183 mg/dL — AB (ref 65–99)
POTASSIUM: 4.8 mmol/L (ref 3.5–5.1)
Sodium: 140 mmol/L (ref 135–145)

## 2016-11-23 LAB — DIGOXIN LEVEL: Digoxin Level: 0.3 ng/mL — ABNORMAL LOW (ref 0.8–2.0)

## 2016-11-23 MED ORDER — LOSARTAN POTASSIUM 25 MG PO TABS: 25.0000 mg | ORAL_TABLET | Freq: Two times a day (BID) | ORAL | 3 refills | Status: DC

## 2016-11-23 NOTE — Patient Instructions (Signed)
Stop Potassium  Increase Losartan 25 mg (1 tab), twice a day  Labs drawn today (if we do not call you, then your lab work was stable)   Your physician recommends that you return for lab work in: 10 days   Your physician recommends that you schedule a follow-up appointment in: 3 months with Dr. [name]

## 2016-11-23 NOTE — Progress Notes (Signed)
Advanced Heart Failure Clinic Note   Primary Care: Dr. Anitra Lauth Primary Cardiologist: Dr. [name]  HPI:  Joshua [name] is a 76 y.o. 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   Admitted 5/31 -> 06/11/16 with worsening SOB, volume overload, and increased 02 demand. Echo showed EF down to 25-30%.  Diuresed on IV lasix and given solumedrol. Thought to be mixed AECOPD and acute CHF. Moved to stepdown 06/05/16 with worsening resp status and BiPAP requirement. Improved with nebs, steroids, ABX and IV lasix + metolazone. Sent home on [place] and prednisone taper. Overall diuresed 22 lbs. He had left and right heart cath with patent grafts and no interventional target.  Discharge weight 233 lbs.   Pt admitted from 6/11 -> 06/17/16 with syncope (unwitnessed) Noted to have fall x 3. EKG with LBBB. Creatinine mildly elevated. CT head negative for acute process. R hip xray negative for fracture or dislocated hardware.  Felt to be vasovagal. Received NS and diuretics held up to discharge.  Creatinine improved.  Unable to tolerate Entresto due to lightheadedness/hypotension.   He returns today for followup of CHF.  Symptomatically, doing well.  He uses a walker for balance. No dyspnea walking on flat ground, he does get short of breath with stairs.  No orthopnea/PND. No lightheadedness/syncope.  Weight is up, but he has been at the beach and ate out for every meal last week.    Labs (8/18): K 4.7, creatinine 1.05 Labs (9/18): K 4.3, creatinine 1.44 Labs (10/18): K 4.9, creatinine 0.99  PMH:  1. CAD: s/p CABG in 8/15.  - LHC (6/18) with 80% distal LM, TO pLAD, TO ostial LCx, subtotal occlusion distal RCA.  LIMA-LAD patent, Y graft to OM and D patent (50% stenosis in graft at touchdown on OM), patent SVG-PDA.  2. Chronic systolic CHF: Ischemic cardiomyopathy.  Echo (6/18) with EF 25-30%, diffuse HK, moderate MR, mildly dilated RV with mildly decreased systolic  function, PASP 48 mmHg.  - RHC (6/18): mean RA 10, PA 45/26, mean PCWP 21, CI 2.11 (Fick) and 2.41 (thermo), PVR 2.9 WU.  - Echo (9/18): EF 40-45%, septal-lateral dyssynchrony, diffuse hypokinesis, normal RV size and systolic function.  3. COPD: Prior smoker.   4. Hyperlipidemia 5. Atrial fibrillation: Paroxysmal.  6. Type II diabetes 7. HTN 8. Anemia: h/o Fe deficiency 9. 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  10. H/o pericardial effusion with tamponade in 6/15.  11. HTN 12. LBBB  Review of systems complete and found to be negative unless listed in HPI.     Current Outpatient Medications  Medication Sig Dispense Refill  . ACCU-CHEK GUIDE test strip USE TO CHECK BLOOD SUGAR 3 TIMES DAILY 100 each 11  . acetaminophen (TYLENOL) 325 MG tablet Take 2 tablets (650 mg total) by mouth every 4 (four) hours as needed for headache or mild pain.    . Ascorbic Acid (VITAMIN C) 1000 MG tablet Take 1,000 mg by mouth daily.    [name] atorvastatin (LIPITOR) 40 MG tablet TAKE ONE TABLET BY MOUTH EVERY DAY 90 tablet 1  . bisoprolol (ZEBETA) 5 MG tablet Take 1 tablet (5 mg total) by mouth daily. 30 tablet 6  . budesonide-formoterol (SYMBICORT) 160-4.5 MCG/ACT inhaler Inhale 2 puffs into the lungs 2 (two) times daily. 1 Inhaler 12  . Cholecalciferol (VITAMIN D-3) 1000 units CAPS Take 1,000 Units by mouth daily. Reported on 02/26/2015    . digoxin (  LANOXIN) 0.125 MG tablet Take 1 tablet (0.125 mg total) by mouth daily. 30 tablet 6  . fluticasone (FLONASE) 50 MCG/ACT nasal spray Place 2 sprays into both nostrils daily. 16 g 1  . gabapentin (NEURONTIN) 100 MG capsule Take 200 mg by mouth 3 (three) times daily.    [name] guaiFENesin (MUCINEX) 600 MG 12 hr tablet Take 2 tablets (1,200 mg total) by mouth 2 (two) times daily. 30 tablet 0  . hyoscyamine (LEVSIN SL) 0.125 MG SL tablet 1-2 tabs po q6h prn diarrhea 30 tablet 1  . Insulin Disposable Pump (V-GO 20) KIT Apply one Vgo 20 pump  daily. 30 kit 3  . Insulin Glargine (TOUJEO MAX SOLOSTAR) 300 UNIT/ML SOPN Inject 50 Units into the skin 2 (two) times daily.    . insulin regular human CONCENTRATED (HUMULIN R) 500 UNIT/ML injection Use 56 units daily in insulin pump 20 mL 0  . Insulin Syringe-Needle U-100 30G X 1/2" 1 ML MISC Use to fill insulin VGo pump one time daily 100 each 2  . isosorbide mononitrate (IMDUR) 30 MG 24 hr tablet TAKE ONE TABLET BY MOUTH EVERY DAY 90 tablet 1  . losartan (COZAAR) 25 MG tablet Take 1 tablet (25 mg total) 2 (two) times daily with a meal by mouth. 60 tablet 3  . mirtazapine (REMERON) 45 MG tablet Take 1 tablet (45 mg total) at bedtime by mouth. 30 tablet 1  . Multiple Vitamin (MULTIVITAMIN WITH MINERALS) TABS Take 1 tablet by mouth daily.    . nitroGLYCERIN (NITROSTAT) 0.4 MG SL tablet Place 1 tablet (0.4 mg total) under the tongue every 5 (five) minutes as needed for chest pain. 25 tablet 3  . PROAIR HFA 108 (90 Base) MCG/ACT inhaler INHALE 2 PUFFS INTO THE LUNGS EVERY 4 HOURS AS NEEDED FOR WHEEZING ORSHORTNESS OF BREATH 8.5 g 1  . rivaroxaban (XARELTO) 20 MG TABS tablet Take 1 tablet (20 mg total) by mouth daily with supper. 90 tablet 3  . spironolactone (ALDACTONE) 25 MG tablet Take 1 tablet (25 mg total) by mouth daily. 30 tablet 6  . SYNJARDY 05-998 MG TABS TAKE ONE TABLET BY MOUTH TWICE DAILY AFTER A MEAL 60 tablet 2  . torsemide (DEMADEX) 20 MG tablet Take 20 mg by mouth daily. Takes 1 tablet daily    . tamsulosin (FLOMAX) 0.4 MG CAPS capsule TAKE 1 CAPSULE BY MOUTH EVERY DAY AFTER SUPPER 90 capsule 1   No current facility-administered medications for this encounter.     Allergies  Allergen Reactions  . Morphine And Related Other (See Comments)    Drenched with perspiration  . Entresto [Sacubitril-Valsartan]     hypotension  . Demerol [Meperidine] Nausea Only  . Starlix [Nateglinide] Other (See Comments)    gassy      Social History   Socioeconomic History  . Marital status:  Married    Spouse name: [name]  . Number of children: 4  . Years of education: Not on file  . Highest education level: Not on file  Social Needs  . Financial resource strain: Not on file  . Food insecurity - worry: Not on file  . Food insecurity - inability: Not on file  . Transportation needs - medical: Not on file  . Transportation needs - non-medical: Not on file  Occupational History  . Occupation: retired, former Scientist, forensic  Tobacco Use  . Smoking status: Former Smoker    Packs/day: 0.25    Years: 62.00    Pack years: 15.50  Types: Cigarettes    Last attempt to quit: 10/23/2015    Years since quitting: 1.0  . Smokeless tobacco: Former Systems developer    Quit date: 06/30/2013  Substance and Sexual Activity  . Alcohol use: No    Alcohol/week: 0.0 oz  . Drug use: No  . Sexual activity: No  Other Topics Concern  . Not on file  Social History Narrative   Married, 4 children.   Formerly a Medical illustrator.   Level of education: HS.     +tobacco--lifelong/ quit 2015).  No alcohol or drugs.   No exercise.  +excessive caffeine.      Family History  Problem Relation Age of Onset  . Heart disease Father   . Heart attack Father   . CVA Mother   . Hypertension Mother   . Diabetes Paternal Grandmother   . Breast cancer Sister   . Diabetes Maternal Uncle   . Heart disease Maternal Uncle   . Stroke Neg Hx     Vitals:   11/23/16 0924  BP: (!) 152/70  Pulse: 63  SpO2: 94%  Weight: 237 lb 12.8 oz (107.9 kg)   Wt Readings from Last 3 Encounters:  11/23/16 237 lb 12.8 oz (107.9 kg)  11/11/16 237 lb 3.2 oz (107.6 kg)  11/10/16 238 lb (108 kg)     PHYSICAL EXAM: General: NAD Neck: No JVD, no thyromegaly or thyroid nodule.  Lungs: Clear to auscultation bilaterally with normal respiratory effort. CV: Nondisplaced PMI.  Heart regular S1/S2, no S3/S4, no murmur. 1+ ankle edema on left, no edema on right.  No carotid bruit.  Normal pedal pulses.  Abdomen: Soft,  nontender, no hepatosplenomegaly, no distention.  Skin: Intact without lesions or rashes.  Neurologic: Alert and oriented x 3.  Psych: Normal affect. Extremities: No clubbing or cyanosis.  HEENT: Normal.   ASSESSMENT & PLAN:  1. Chronic systolic CHF: Ischemic cardiomyopathy.  Echo (6/18) with EF 25-30%.  Repeat echo in 9/18 with EF 40-45%, septal-lateral dyssynchrony.  NYHA class II-III symptoms, stable.  Weight is up 10 lbs but he is not volume overloaded by exam. Suspect this may be caloric.  - Continue torsemide 20 mg daily and spironolactone 25 mg daily.  - He did not tolerate low dose Entresto due to hypotension.  Today I will have him increase losartan to 25 mg bid (think we can also stop KCl).  BMET in 10 days.   - Continue digoxin, check level today. - Continue bisoprolol 5 mg daily.  - EF is > 35% on last echo, not candidate for CRT-D at this point.   2. COPD: No longer smoking.  Not using home oxygen at this time.  3. CAD s/p CABG: Cath 06/10/16 with severe 3 vessel native disease with patent bypass grafts. No interventional target.  - Continue statin.  With stable CAD and Xarelto use, does not need to be on ASA.  4. Atrial fibrillation: Paroxysmal.  He is in NSR today.  - Continue Xarelto.   Followup in 3 months.   Loralie Champagne, MD 11/23/16

## 2016-11-24 ENCOUNTER — Encounter: Payer: Self-pay | Admitting: Family Medicine

## 2016-11-30 ENCOUNTER — Other Ambulatory Visit: Payer: Self-pay

## 2016-11-30 ENCOUNTER — Telehealth: Payer: Self-pay | Admitting: Endocrinology

## 2016-11-30 NOTE — Telephone Encounter (Signed)
Please send refill

## 2016-11-30 NOTE — Telephone Encounter (Signed)
Refill of insulin regular human CONCENTRATED (HUMULIN R) 500 UNIT/ML injection [[id number]]  Send to Pharmacy:  [facility] [address] [place]

## 2016-12-01 ENCOUNTER — Other Ambulatory Visit: Payer: Self-pay

## 2016-12-01 ENCOUNTER — Other Ambulatory Visit: Payer: Self-pay | Admitting: Family Medicine

## 2016-12-01 MED ORDER — INSULIN REGULAR HUMAN (CONC) 500 UNIT/ML ~~LOC~~ SOLN: SUBCUTANEOUS | 3 refills | Status: DC

## 2016-12-01 NOTE — Telephone Encounter (Signed)
Called patient and let his wife know that I have sent over the refill for his insulin to [facility].

## 2016-12-03 ENCOUNTER — Other Ambulatory Visit (HOSPITAL_COMMUNITY): Payer: Self-pay | Admitting: *Deleted

## 2016-12-03 ENCOUNTER — Other Ambulatory Visit (HOSPITAL_COMMUNITY): Payer: Self-pay

## 2016-12-03 DIAGNOSIS — I5032 Chronic diastolic (congestive) heart failure: Secondary | ICD-10-CM

## 2016-12-04 ENCOUNTER — Encounter: Payer: Self-pay | Admitting: Gastroenterology

## 2016-12-04 ENCOUNTER — Ambulatory Visit: Payer: Medicare Other | Admitting: Gastroenterology

## 2016-12-04 ENCOUNTER — Ambulatory Visit (HOSPITAL_COMMUNITY)
Admission: RE | Admit: 2016-12-04 | Discharge: 2016-12-04 | Disposition: A | Discharge status: Home / Self Care | Payer: Medicare Other | Source: Ambulatory Visit | Attending: Cardiology | Admitting: Cardiology

## 2016-12-04 VITALS — BP 108/52 | HR 68 | Ht 71.0 in | Wt 238.5 lb

## 2016-12-04 DIAGNOSIS — Z86010 Personal history of colonic polyps: Secondary | ICD-10-CM | POA: Diagnosis not present

## 2016-12-04 DIAGNOSIS — Z7901 Long term (current) use of anticoagulants: Secondary | ICD-10-CM | POA: Diagnosis not present

## 2016-12-04 DIAGNOSIS — J438 Other emphysema: Secondary | ICD-10-CM

## 2016-12-04 DIAGNOSIS — I2510 Atherosclerotic heart disease of native coronary artery without angina pectoris: Secondary | ICD-10-CM | POA: Diagnosis not present

## 2016-12-04 DIAGNOSIS — N183 Chronic kidney disease, stage 3 unspecified: Secondary | ICD-10-CM

## 2016-12-04 DIAGNOSIS — I5022 Chronic systolic (congestive) heart failure: Secondary | ICD-10-CM

## 2016-12-04 DIAGNOSIS — R197 Diarrhea, unspecified: Secondary | ICD-10-CM | POA: Diagnosis not present

## 2016-12-04 DIAGNOSIS — I5032 Chronic diastolic (congestive) heart failure: Secondary | ICD-10-CM

## 2016-12-04 LAB — BASIC METABOLIC PANEL
Anion gap: 10 (ref 5–15)
BUN: 22 mg/dL — AB (ref 6–20)
CHLORIDE: 107 mmol/L (ref 101–111)
CO2: 22 mmol/L (ref 22–32)
Calcium: 9 mg/dL (ref 8.9–10.3)
Creatinine, Ser: 1.32 mg/dL — ABNORMAL HIGH (ref 0.61–1.24)
GFR calc Af Amer: 58 mL/min — ABNORMAL LOW (ref >60)
GFR calc non Af Amer: 50 mL/min — ABNORMAL LOW (ref >60)
GLUCOSE: 142 mg/dL — AB (ref 65–99)
POTASSIUM: 4.5 mmol/L (ref 3.5–5.1)
Sodium: 139 mmol/L (ref 135–145)

## 2016-12-04 NOTE — Patient Instructions (Signed)
If you are age 77 or older, your body mass index should be between 23-30. Your Body mass index is 33.26 kg/m. If this is out of the aforementioned range listed, please consider follow up with your Primary Care Provider.  If you are age 80 or younger, your body mass index should be between 19-25. Your Body mass index is 33.26 kg/m. If this is out of the aformentioned range listed, please consider follow up with your Primary Care Provider.   Thank you for choosing [facility]  Dr Wilfrid Lund III

## 2016-12-04 NOTE — Progress Notes (Signed)
[facility] Consult Note:  History: Joshua [name] 12/04/2016  Referring physician: Tammi Sou, MD  Reason for consult/chief complaint: Colon Cancer Screening and Diarrhea (watery. Has increased urgency. Taking Levsin q6h prn or sooner.)   Subjective  HPI:  This is a 76 year old man here to see me for history of colon polyps.  He has also developed intermittent diarrhea over the last 6-9 months.  There was no preceding antibiotics or travel.  His PCP gave him Levsin, which works well with either 1 or 2 doses.  It seems like the diarrhea will come in episodes, where he will unpredictably get semi-formed to loose stool with some urgency for a day or 2.  There is no blood or abdominal pain, and again it responds well to the medicine. He was seen by Dr. Deatra [name] for colonoscopy in October 2013, when 2 diminutive adenomatous polyps were removed. Joshua [name] denies heartburn, dysphagia, odynophagia, anorexia or weight loss.  I have reviewed his extensive medical history, most notably COPD, chronic kidney disease, and congestive heart failure and insulin requiring diabetes. I reviewed his most recent family practice and cardiology office notes. Joshua [name] was hospitalized in June of this year for a severe exacerbation of CHF.  His EF was depressed at 20-25%, he had significant volume overload and was oxygen requiring.  He has recovered fairly well from that, and a more recent ejection fraction was 40-45% with better volume status according to Joshua [name] recent office note.  His diabetes is been under suboptimal control with a hemoglobin A1c of 9.3  ROS:  Review of Systems  Constitutional: Negative for appetite change and unexpected weight change.  HENT: Negative for mouth sores and voice change.   Eyes: Negative for pain and redness.  Respiratory: Positive for shortness of breath. Negative for cough.   Cardiovascular: Negative for chest pain and palpitations.  Genitourinary:  Negative for dysuria and hematuria.  Musculoskeletal: Positive for arthralgias. Negative for myalgias.  Skin: Negative for pallor and rash.  Neurological: Negative for weakness and headaches.  Hematological: Negative for adenopathy.     Past Medical History: Past Medical History:  Diagnosis Date  . Acute respiratory distress 06/04/2016  . Adenomatous colon polyp 10/16/2011   Repeat 2018  . Arthritis     Hips, R>L & KNEES  . CAD, multiple vessel    3V CAD cath 08/08/13----CABG done shortly after.  Cath 06/2016: clear bipasses/no interventional lesion.  . 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 pylori neg, no dysplasia or malignancy.  . Chronic combined systolic and diastolic CHF, NYHA class 2 (Joshua [name]) 2016-2018   Followed by CHF clinic  . Chronic renal insufficiency, stage 3 (moderate) (HCC) 2018   GFR 50s  . COPD (chronic obstructive pulmonary disease) ([place])    GOLD II.  Spirometry  2004 borderline obstruction; 2015 mod obst: noncompliant with bid symbicort so pulmonologist switched him to a once daily inhaler: Trelegy ellipta 12/2015.  Oxygen prn: goal 88-92%.  . Diabetic nephropathy ([place])    Elevated urine microalb/cr 03/2011  . DM type 2 (diabetes mellitus, type 2) ([place])    Poor control on max oral meds--pt eventually agreed to insulin therapy.  As of 2017 his DM is being managed by Dr. Dwyane [name] in endo.  . DOE (dyspnea on exertion)    COPD + chronic diastolic HF  . Erectile dysfunction    Normal testosterone  . Hearing loss of both ears 2016   Hearing aids  . Hyperlipidemia   .  Hyperplastic colon polyp 2001  . Hypertension   . Iron deficiency anemia 2014   03/2012 capsule endoscopy showed 2 AVMs--likely responsible for his IDA--lifetime iron supp recommended + q71moCBCs.  . Ischemic cardiomyopathy 2018   06/2016: EF 25-30%, global hypokinesis, grd II DD, vent septum motion changes c/w LBBB, mod MV regurg, L atr mod/sev dilat, R atr mod dilat, mod  incr pulm press, Grd II DD.  . Macular degeneration, dry    Mild, bilat (Optometrist, DMayford Knifeat [name]  . Obesity   . Open toe wound 11/2015   Wound care clinic appt made and then canceled when toe improved.  . Other and unspecified angina pectoris   . PAD (peripheral artery disease) ([place] 02/2016   Abnormal ABI's and waveforms: LE arterial duplex ordered for f/u as per cardiologist's recommendation.  Vasc eval by Dr. CBridgett [name] impression was minimal PAD, recommended maximize med mgmt.  .Marland KitchenPAF (paroxysmal atrial fibrillation) ([place] 10/2014   xarelto  . Pericardial effusion with cardiac tamponade 6//29/15   pericardiocentesis was done,  Infectious/inflamm (cytology showed NO MALIGNANT CELLS)  . Pleural plaque    Pleural plaques/asbestosis changes on CT chest done by pulm 05/2016.  .Marland KitchenPneumonia   . Tobacco dependence in remission    100+ pack-yr hx: quit after CABG     Past Surgical History: Past Surgical History:  Procedure Laterality Date  . APPENDECTOMY  1957  . CARDIAC CATHETERIZATION  08/08/2013  . CATARACT EXTRACTION W/ INTRAOCULAR LENS  IMPLANT, BILATERAL  04/08/2006 & 04/22/2006  . CATARACT EXTRACTION W/ INTRAOCULAR LENS  IMPLANT, BILATERAL Bilateral   . CHOLECYSTECTOMY OPEN  1999  . COLONOSCOPY  10/16/2011   Procedure: COLONOSCOPY;  Surgeon: Joshua Castle MD;  Location: [facility];  Service: Endoscopy;  Laterality: N/A;  . CORONARY ARTERY BYPASS GRAFT N/A 08/14/2013   Procedure: CORONARY ARTERY BYPASS GRAFTING (CABG);  Surgeon: Joshua Nakayama MD;  [place]  Service: Open Heart Surgery;  Laterality: N/A;  Times 4   using left internal mammary artery and endoscopically harvested bilateral saphenous vein  . ESOPHAGOGASTRODUODENOSCOPY  02/25/12   Atrophic gastritis with a few erosions--capsule endoscopy planned as of 02/25/12 (Dr. KDeatra [name].  .Marland KitchenHARDWARE REMOVAL Right 08/12/2012   Procedure: HARDWARE REMOVAL;  Surgeon: Joshua Rossetti MD;  Location: [facility];  Service: Orthopedics;  Laterality: Right;  . HIP SURGERY Right 1954   Repair of slipped capital femoral epiphysis.  . INTRAOPERATIVE TRANSESOPHAGEAL ECHOCARDIOGRAM N/A 08/14/2013   Normal LV function. Procedure: INTRAOPERATIVE TRANSESOPHAGEAL ECHOCARDIOGRAM;  Surgeon: Joshua Nakayama MD;  [place]  Service: Open Heart Surgery;  Laterality: N/A;  . LEFT HEART CATHETERIZATION WITH CORONARY ANGIOGRAM N/A 08/08/2013   Procedure: LEFT HEART CATHETERIZATION WITH CORONARY ANGIOGRAM;  Surgeon: JJettie Booze MD;  [facility];  Service: Cardiovascular;  Laterality: N/A;  . PATELLA FRACTURE SURGERY Left ~ 1979   bolt + 3 screws to repair tib plateau fx  . PERICARDIAL TAP N/A 07/01/2013   Procedure: PERICARDIAL TAP;  Surgeon: JJettie Booze MD;  [facility];  Service: Cardiovascular;  Laterality: N/A;  . RIGHT/LEFT HEART CATH AND CORONARY ANGIOGRAPHY N/A 06/10/2016   Procedure: Right/Left Heart Cath and Coronary Angiography;  Surgeon: MLarey Dresser MD;  [place];  Service: Cardiovascular;  Laterality: N/A;  . TESTICLE SURGERY  as a child   Undescended testicle brought down into scrotum  . TONSILLECTOMY  1947  .  TOTAL HIP ARTHROPLASTY Right 08/12/2012   Procedure: REMOVAL OF OLD PINS RIGHT HIP AND RIGHT TOTAL HIP ARTHROPLASTY ANTERIOR APPROACH;  Surgeon: Joshua Rossetti, MD;  [facility];  Service: Orthopedics;  Laterality: Right;  . TRANSESOPHAGEAL ECHOCARDIOGRAM  09/22/2016   EF 40-45%; diffuse hypokinesis, Grd I DD.  [name] TRANSTHORACIC ECHOCARDIOGRAM  10/30/14   Mod LVH, EF 60-65%, normal wall motion, mod mitral regurg, mild PAH  . TRANSTHORACIC ECHOCARDIOGRAM  06/2016   06/2016: EF 25-30%, global hypokinesis, grd II DD, vent septum motion changes c/w LBBB, mod MV regurg, L atr mod/sev dilat, R atr mod dilat, mod incr pulm press, Grd II DD.     Family History: Family History  Problem Relation  Age of Onset  . Heart disease Father   . Heart attack Father   . CVA Mother   . Hypertension Mother   . Diabetes Paternal Grandmother   . Breast cancer Sister   . Diabetes Maternal Uncle   . Heart disease Maternal Uncle   . Stroke Neg Hx     Social History: Social History   Socioeconomic History  . Marital status: Married    Spouse name: [name]  . Number of children: 4  . Years of education: None  . Highest education level: None  Social Needs  . Financial resource strain: None  . Food insecurity - worry: None  . Food insecurity - inability: None  . Transportation needs - medical: None  . Transportation needs - non-medical: None  Occupational History  . Occupation: retired, former Scientist, forensic  Tobacco Use  . Smoking status: Former Smoker    Packs/day: 0.25    Years: 62.00    Pack years: 15.50    Types: Cigarettes    Last attempt to quit: 10/23/2015    Years since quitting: 1.1  . Smokeless tobacco: Former Systems developer    Quit date: 06/30/2013  Substance and Sexual Activity  . Alcohol use: No    Alcohol/week: 0.0 oz  . Drug use: No  . Sexual activity: No  Other Topics Concern  . None  Social History Narrative   Married, 4 children.   Formerly a Medical illustrator.   Level of education: HS.     +tobacco--lifelong/ quit 2015).  No alcohol or drugs.   No exercise.  +excessive caffeine.    Allergies: Allergies  Allergen Reactions  . Morphine And Related Other (See Comments)    Drenched with perspiration  . Entresto [Sacubitril-Valsartan]     hypotension  . Demerol [Meperidine] Nausea Only  . Starlix [Nateglinide] Other (See Comments)    gassy    Outpatient Meds: Current Outpatient Medications  Medication Sig Dispense Refill  . ACCU-CHEK GUIDE test strip USE TO CHECK BLOOD SUGAR 3 TIMES DAILY 100 each 11  . acetaminophen (TYLENOL) 325 MG tablet Take 2 tablets (650 mg total) by mouth every 4 (four) hours as needed for headache or mild pain.    .  Ascorbic Acid (VITAMIN C) 1000 MG tablet Take 1,000 mg by mouth daily.    [name] atorvastatin (LIPITOR) 40 MG tablet TAKE ONE TABLET BY MOUTH EVERY DAY 90 tablet 1  . bisoprolol (ZEBETA) 5 MG tablet Take 1 tablet (5 mg total) by mouth daily. 30 tablet 6  . Cholecalciferol (VITAMIN Joshua-3) 1000 units CAPS Take 1,000 Units by mouth daily. Reported on 02/26/2015    . digoxin (LANOXIN) 0.125 MG tablet Take 1 tablet (0.125 mg total) by mouth daily. 30 tablet 6  . fluticasone (FLONASE)  50 MCG/ACT nasal spray Place 2 sprays into both nostrils daily. 16 g 1  . gabapentin (NEURONTIN) 100 MG capsule Take 200 mg by mouth 3 (three) times daily.    [name] guaiFENesin (MUCINEX) 600 MG 12 hr tablet Take 2 tablets (1,200 mg total) by mouth 2 (two) times daily. 30 tablet 0  . hyoscyamine (LEVSIN SL) 0.125 MG SL tablet 1-2 tabs po q6h prn diarrhea 30 tablet 1  . Insulin Disposable Pump (V-GO 20) KIT Apply one Vgo 20 pump daily. 30 kit 3  . Insulin Glargine (TOUJEO MAX SOLOSTAR) 300 UNIT/ML SOPN Inject 50 Units into the skin 2 (two) times daily.    . insulin regular human CONCENTRATED (HUMULIN R) 500 UNIT/ML injection Use 56 units daily in insulin pump 20 mL 3  . Insulin Syringe-Needle U-100 30G X 1/2" 1 ML MISC Use to fill insulin VGo pump one time daily 100 each 2  . isosorbide mononitrate (IMDUR) 30 MG 24 hr tablet TAKE ONE TABLET BY MOUTH EVERY DAY 90 tablet 1  . losartan (COZAAR) 25 MG tablet Take 1 tablet (25 mg total) 2 (two) times daily with a meal by mouth. 60 tablet 3  . mirtazapine (REMERON) 45 MG tablet Take 1 tablet (45 mg total) at bedtime by mouth. 30 tablet 1  . Multiple Vitamin (MULTIVITAMIN WITH MINERALS) TABS Take 1 tablet by mouth daily.    . nitroGLYCERIN (NITROSTAT) 0.4 MG SL tablet Place 1 tablet (0.4 mg total) under the tongue every 5 (five) minutes as needed for chest pain. 25 tablet 3  . OXYGEN Inhale 2 L into the lungs.    [name] PROAIR HFA 108 (90 Base) MCG/ACT inhaler INHALE 2 PUFFS INTO THE LUNGS EVERY 4  HOURS AS NEEDED FOR WHEEZING ORSHORTNESS OF BREATH 8.5 g 1  . rivaroxaban (XARELTO) 20 MG TABS tablet Take 1 tablet (20 mg total) by mouth daily with supper. 90 tablet 3  . spironolactone (ALDACTONE) 25 MG tablet Take 1 tablet (25 mg total) by mouth daily. 30 tablet 6  . SYMBICORT 160-4.5 MCG/ACT inhaler INHALE 2 PUFFS INTO THE LUNGS TWICE DAILY 10.2 g 5  . SYNJARDY 05-998 MG TABS TAKE ONE TABLET BY MOUTH TWICE DAILY AFTER A MEAL 60 tablet 2  . tamsulosin (FLOMAX) 0.4 MG CAPS capsule TAKE 1 CAPSULE BY MOUTH EVERY DAY AFTER SUPPER 90 capsule 1  . torsemide (DEMADEX) 20 MG tablet Take 20 mg by mouth daily. Takes 1 tablet daily     No current facility-administered medications for this visit.       ___________________________________________________________________ Objective   Exam:  BP (!) 108/52   Pulse 68   Ht _0  (1.803 m)   Wt 238 lb 8 oz (108.2 kg)   BMI 33.26 kg/m    General: this is a(n) chronically ill-appearing elderly man, no acute distress today.  He is alert, conversational, with normal vocal quality.  He is able to get on the exam table slowly but without assistance.  Even this activity makes him a little dyspneic.  His wife is present for the entire encounter.  Eyes: sclera anicteric, no redness  ENT: oral mucosa moist without lesions, no cervical or supraclavicular lymphadenopathy, good dentition  CV: RRR without murmur, S1/S2, no JVD, no peripheral edema  Resp: clear to auscultation bilaterally, normal RR and effort noted  GI: soft, overweight, no tenderness, with active bowel sounds. No guarding or palpable organomegaly noted.  No bruit  Skin; warm and dry, no rash or jaundice noted  Neuro: awake, alert and oriented x 3. Normal gross motor function and fluent speech  Labs:  CMP Latest Ref Rng & Units 11/23/2016 10/05/2016 09/09/2016  Glucose 65 - 99 mg/dL 183(H) 346(H) 206(H)  BUN 6 - 20 mg/dL 19 21 25(H)  Creatinine 0.61 - 1.24 mg/dL 1.04 0.99 1.44(H)    Sodium 135 - 145 mmol/L 140 138 140  Potassium 3.5 - 5.1 mmol/L 4.8 4.9 4.3  Chloride 101 - 111 mmol/L 106 101 101  CO2 22 - 32 mmol/L _0 Calcium 8.9 - 10.3 mg/dL 9.6 10.7(H) 9.1  Total Protein 6.5 - 8.1 g/dL - - -  Total Bilirubin 0.3 - 1.2 mg/dL - - -  Alkaline Phos 38 - 126 U/L - - -  AST 15 - 41 U/L - - -  ALT 17 - 63 U/L - - -   CBC Latest Ref Rng & Units 08/28/2016 08/27/2016 08/24/2016  WBC 4.0 - 10.5 K/uL 7.5 9.8 8.1  Hemoglobin 13.0 - 17.0 g/dL 12.8(L) 13.9 13.2  Hematocrit 39.0 - 52.0 % 43.1 44.6 42.1  Platelets 150 - 400 K/uL 175 200 177   Hgb A1C 9.3  I reviewed his last 2 echocardiogram reports  Assessment: Encounter Diagnoses  Name Primary?  . Diarrhea, unspecified type   . History of colonic polyps Yes  . Chronic diastolic CHF (congestive heart failure) ([place])   . Chronic systolic CHF (congestive heart failure) ([place])   . Coronary artery disease involving native coronary artery of native heart without angina pectoris   . Other emphysema ([place])   . CKD (chronic kidney disease) stage 3, GFR 30-59 ml/min (HCC)   . 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  I believe at least some of it is related to his poorly controlled diabetes.  His other medicines do not seem likely to cause diarrhea as a side effect.  He does not have bleeding, anorexia, abdominal pain or weight loss.  I will continue to treat this symptomatically with Levsin since it seems to work well.  Given his multiple medical issues as outlined above and chronic anticoagulation, I think the risks of a surveillance colonoscopy outweigh the expected benefits.  He and his wife are in agreement, so we have not scheduled it.  I will gladly see him as the need arises.  Total time 30 minutes face-to-face, over half spent in extensive record review, counseling and coordinating care.  Thank you for the courtesy of this consult.  Please call me with any questions or  concerns.  Nelida Meuse III  CC: McGowen, Adrian Blackwater, MD

## 2016-12-06 ENCOUNTER — Encounter: Payer: Self-pay | Admitting: Family Medicine

## 2016-12-08 ENCOUNTER — Other Ambulatory Visit: Payer: Self-pay

## 2016-12-08 ENCOUNTER — Ambulatory Visit (INDEPENDENT_AMBULATORY_CARE_PROVIDER_SITE_OTHER): Payer: Medicare Other | Admitting: Family Medicine

## 2016-12-08 ENCOUNTER — Encounter: Payer: Self-pay | Admitting: Family Medicine

## 2016-12-08 VITALS — BP 105/55 | HR 63 | Temp 97.8°F | Resp 18 | Ht 71.0 in | Wt 234.0 lb

## 2016-12-08 DIAGNOSIS — I5042 Chronic combined systolic (congestive) and diastolic (congestive) heart failure: Secondary | ICD-10-CM

## 2016-12-08 DIAGNOSIS — J441 Chronic obstructive pulmonary disease with (acute) exacerbation: Secondary | ICD-10-CM | POA: Diagnosis not present

## 2016-12-08 DIAGNOSIS — F321 Major depressive disorder, single episode, moderate: Secondary | ICD-10-CM | POA: Diagnosis not present

## 2016-12-08 DIAGNOSIS — J0100 Acute maxillary sinusitis, unspecified: Secondary | ICD-10-CM

## 2016-12-08 MED ORDER — LEVOFLOXACIN 500 MG PO TABS: 500.0000 mg | ORAL_TABLET | Freq: Every day | ORAL | 0 refills | Status: DC

## 2016-12-08 MED ORDER — SERTRALINE HCL 25 MG PO TABS: ORAL_TABLET | ORAL | 0 refills | Status: DC

## 2016-12-08 MED ORDER — PREDNISONE 20 MG PO TABS
ORAL_TABLET | ORAL | 0 refills | Status: DC
Start: 2016-12-08 — End: 2016-12-11

## 2016-12-08 NOTE — Progress Notes (Signed)
OFFICE VISIT  12/08/2016   CC:  Chief Complaint  Patient presents with  . Follow-up    Depression and Respiratory Illness   HPI:    Patient is a 76 y.o. Caucasian male who presents for 1 mo f/u current moderate episode of MDD. I increased his remeron to 45 mg qd last visit, as he had shown minimal improvement as we titrated up from 15-87m qd dosing. Also encouraged him to re-estab counseling with Dr. MGaynell Face Neither he nor his wife feel like he has made any improvement regarding depressed mood. He did not get back in touch with his counselor.  Denies HI or SI.  From a cv/resp standpoint, pt was stable at my last f/u with him (chronic hypoxic resp failure secondary to COPD and combined systolic and diastolic HF). Last cardiologist f/u he was stable.  His losartan was increased to 242mbid and potassium d/c'd, bisoprolol and digoxin unchanged, was continued on torsemide 20 mg daily and spironolactone 25 mg daily. He did not tolerate a trial of entresto in the recent past.  EF up some on last ECHO--pt not a candidate for CRRT as of now. BMET f/u after these changes showed stable labs.  Acute illness:  For about 8-10 days he has had nasal congestion, cough, wheezing, fatigue, some mild HA.  No aching, no fever. Tried otc cold med and tylenol--helps a little.  Sx's wax/wane.  No change in chronic DOE.  Has not had to use his oxygen during this time.  Past Medical History:  Diagnosis Date  . Acute respiratory distress 06/04/2016  . Adenomatous colon polyp 10/16/2011   Repeat 2018  . Arthritis     Hips, R>L & KNEES  . CAD, multiple vessel    3V CAD cath 08/08/13----CABG done shortly after.  Cath 06/2016: clear bipasses/no interventional lesion.  . 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 pylori neg, no dysplasia or malignancy.  . Chronic combined systolic and diastolic CHF, NYHA class 2 ([facility]2016-2018   Followed by CHF clinic  . Chronic renal  insufficiency, stage 3 (moderate) (HCC) 2018   GFR 50s  . COPD (chronic obstructive pulmonary disease) ([facility]   GOLD II.  Spirometry  2004 borderline obstruction; 2015 mod obst: noncompliant with bid symbicort so pulmonologist switched him to a once daily inhaler: Trelegy ellipta 12/2015.  Oxygen prn: goal 88-92%.  . Diabetic nephropathy ([facility]   Elevated urine microalb/cr 03/2011  . DM type 2 (diabetes mellitus, type 2) ([facility]   Poor control on max oral meds--pt eventually agreed to insulin therapy.  As of 2017 his DM is being managed by Dr. KuDwyane Deen endo.  . DOE (dyspnea on exertion)    COPD + chronic diastolic HF  . Erectile dysfunction    Normal testosterone  . Hearing loss of both ears 2016   Hearing aids  . Hyperlipidemia   . Hyperplastic colon polyp 2001   No further colonoscopies to be done as of 11/2016 GI eval.  . Hypertension   . IBS (irritable bowel syndrome)    responds well to levsin  . Iron deficiency anemia 2014   03/2012 capsule endoscopy showed 2 AVMs--likely responsible for his IDA--lifetime iron supp recommended + q6m43moCs.  . Ischemic cardiomyopathy 06/2016; 09/2016   06/2016: EF 25-30%, global hypokinesis, grd II DD, vent septum motion changes c/w LBBB, mod MV regurg, L atr mod/sev dilat, R atr mod dilat, mod incr pulm press, Grd II DD.  09/2016--EF 40-45%; diffuse hypokinesis, Grd I DD.  . Macular degeneration, dry    Mild, bilat (Optometrist, Mayford Knife at [facility] in [place], [place])  . Obesity   . Open toe wound 11/2015   Wound care clinic appt made and then canceled when toe improved.  . Other and unspecified angina pectoris   . PAD (peripheral artery disease) ([place]) 02/2016   Abnormal ABI's and waveforms: LE arterial duplex ordered for f/u as per cardiologist's recommendation.  Vasc eval by Dr. Bridgett Larsson: impression was minimal PAD, recommended maximize med mgmt.  [name] PAF (paroxysmal atrial fibrillation) ([place]) 10/2014   xarelto  . Pericardial effusion  with cardiac tamponade 6//29/15   pericardiocentesis was done,  Infectious/inflamm (cytology showed NO MALIGNANT CELLS)  . Pleural plaque    Pleural plaques/asbestosis changes on CT chest done by pulm 05/2016.  [name] Pneumonia   . Tobacco dependence in remission    100+ pack-yr hx: quit after CABG    Past Surgical History:  Procedure Laterality Date  . APPENDECTOMY  1957  . CARDIAC CATHETERIZATION  08/08/2013  . CATARACT EXTRACTION W/ INTRAOCULAR LENS  IMPLANT, BILATERAL  04/08/2006 & 04/22/2006  . CATARACT EXTRACTION W/ INTRAOCULAR LENS  IMPLANT, BILATERAL Bilateral   . CHOLECYSTECTOMY OPEN  1999  . COLONOSCOPY  10/16/2011   Procedure: COLONOSCOPY;  Surgeon: Inda Castle, MD;  Location: [facility];  Service: Endoscopy;  Laterality: N/A;  . CORONARY ARTERY BYPASS GRAFT N/A 08/14/2013   Procedure: CORONARY ARTERY BYPASS GRAFTING (CABG);  Surgeon: Melrose Nakayama, MD;  Location: [facility];  Service: Open Heart Surgery;  Laterality: N/A;  Times 4   using left internal mammary artery and endoscopically harvested bilateral saphenous vein  . ESOPHAGOGASTRODUODENOSCOPY  02/25/12   Atrophic gastritis with a few erosions--capsule endoscopy planned as of 02/25/12 (Dr. Deatra Ina).  [name] HARDWARE REMOVAL Right 08/12/2012   Procedure: HARDWARE REMOVAL;  Surgeon: Mcarthur Rossetti, MD;  Location: [facility];  Service: Orthopedics;  Laterality: Right;  . HIP SURGERY Right 1954   Repair of slipped capital femoral epiphysis.  . INTRAOPERATIVE TRANSESOPHAGEAL ECHOCARDIOGRAM N/A 08/14/2013   Normal LV function. Procedure: INTRAOPERATIVE TRANSESOPHAGEAL ECHOCARDIOGRAM;  Surgeon: Melrose Nakayama, MD;  Location: [facility];  Service: Open Heart Surgery;  Laterality: N/A;  . LEFT HEART CATHETERIZATION WITH CORONARY ANGIOGRAM N/A 08/08/2013   Procedure: LEFT HEART CATHETERIZATION WITH CORONARY ANGIOGRAM;  Surgeon: Jettie Booze, MD;  Location: [facility] CATH LAB;  Service: Cardiovascular;  Laterality: N/A;  . PATELLA FRACTURE  SURGERY Left ~ 1979   bolt + 3 screws to repair tib plateau fx  . PERICARDIAL TAP N/A 07/01/2013   Procedure: PERICARDIAL TAP;  Surgeon: Jettie Booze, MD;  Location: [facility] CATH LAB;  Service: Cardiovascular;  Laterality: N/A;  . RIGHT/LEFT HEART CATH AND CORONARY ANGIOGRAPHY N/A 06/10/2016   Procedure: Right/Left Heart Cath and Coronary Angiography;  Surgeon: Larey Dresser, MD;  Location: [facility] CV LAB;  Service: Cardiovascular;  Laterality: N/A;  . TESTICLE SURGERY  as a child   Undescended testicle brought down into scrotum  . TONSILLECTOMY  1947  . TOTAL HIP ARTHROPLASTY Right 08/12/2012   Procedure: REMOVAL OF OLD PINS RIGHT HIP AND RIGHT TOTAL HIP ARTHROPLASTY ANTERIOR APPROACH;  Surgeon: Mcarthur Rossetti, MD;  Location: [facility];  Service: Orthopedics;  Laterality: Right;  . TRANSESOPHAGEAL ECHOCARDIOGRAM  09/22/2016   EF 40-45%; diffuse hypokinesis, Grd I DD.  [name] TRANSTHORACIC ECHOCARDIOGRAM  10/30/14   Mod LVH, EF 60-65%, normal wall motion, mod  mitral regurg, mild PAH  . TRANSTHORACIC ECHOCARDIOGRAM  06/2016   06/2016: EF 25-30%, global hypokinesis, grd II DD, vent septum motion changes c/w LBBB, mod MV regurg, L atr mod/sev dilat, R atr mod dilat, mod incr pulm press, Grd II DD.    Outpatient Medications Prior to Visit  Medication Sig Dispense Refill  . ACCU-CHEK GUIDE test strip USE TO CHECK BLOOD SUGAR 3 TIMES DAILY 100 each 11  . acetaminophen (TYLENOL) 325 MG tablet Take 2 tablets (650 mg total) by mouth every 4 (four) hours as needed for headache or mild pain.    . Ascorbic Acid (VITAMIN C) 1000 MG tablet Take 1,000 mg by mouth daily.    [name] atorvastatin (LIPITOR) 40 MG tablet TAKE ONE TABLET BY MOUTH EVERY DAY 90 tablet 1  . bisoprolol (ZEBETA) 5 MG tablet Take 1 tablet (5 mg total) by mouth daily. 30 tablet 6  . Cholecalciferol (VITAMIN D-3) 1000 units CAPS Take 1,000 Units by mouth daily. Reported on 02/26/2015    . digoxin (LANOXIN) 0.125 MG tablet Take 1 tablet  (0.125 mg total) by mouth daily. 30 tablet 6  . fluticasone (FLONASE) 50 MCG/ACT nasal spray Place 2 sprays into both nostrils daily. 16 g 1  . gabapentin (NEURONTIN) 100 MG capsule Take 200 mg by mouth 3 (three) times daily.    [name] guaiFENesin (MUCINEX) 600 MG 12 hr tablet Take 2 tablets (1,200 mg total) by mouth 2 (two) times daily. 30 tablet 0  . hyoscyamine (LEVSIN SL) 0.125 MG SL tablet 1-2 tabs po q6h prn diarrhea 30 tablet 1  . Insulin Disposable Pump (V-GO 20) KIT Apply one Vgo 20 pump daily. 30 kit 3  . Insulin Glargine (TOUJEO MAX SOLOSTAR) 300 UNIT/ML SOPN Inject 50 Units into the skin 2 (two) times daily.    . insulin regular human CONCENTRATED (HUMULIN R) 500 UNIT/ML injection Use 56 units daily in insulin pump 20 mL 3  . Insulin Syringe-Needle U-100 30G X 1/2" 1 ML MISC Use to fill insulin VGo pump one time daily 100 each 2  . isosorbide mononitrate (IMDUR) 30 MG 24 hr tablet TAKE ONE TABLET BY MOUTH EVERY DAY 90 tablet 1  . losartan (COZAAR) 25 MG tablet Take 1 tablet (25 mg total) 2 (two) times daily with a meal by mouth. 60 tablet 3  . mirtazapine (REMERON) 45 MG tablet Take 1 tablet (45 mg total) at bedtime by mouth. 30 tablet 1  . Multiple Vitamin (MULTIVITAMIN WITH MINERALS) TABS Take 1 tablet by mouth daily.    . nitroGLYCERIN (NITROSTAT) 0.4 MG SL tablet Place 1 tablet (0.4 mg total) under the tongue every 5 (five) minutes as needed for chest pain. 25 tablet 3  . OXYGEN Inhale 2 L into the lungs.    [name] PROAIR HFA 108 (90 Base) MCG/ACT inhaler INHALE 2 PUFFS INTO THE LUNGS EVERY 4 HOURS AS NEEDED FOR WHEEZING ORSHORTNESS OF BREATH 8.5 g 1  . rivaroxaban (XARELTO) 20 MG TABS tablet Take 1 tablet (20 mg total) by mouth daily with supper. 90 tablet 3  . spironolactone (ALDACTONE) 25 MG tablet Take 1 tablet (25 mg total) by mouth daily. 30 tablet 6  . SYMBICORT 160-4.5 MCG/ACT inhaler INHALE 2 PUFFS INTO THE LUNGS TWICE DAILY 10.2 g 5  . SYNJARDY 05-998 MG TABS TAKE ONE TABLET BY  MOUTH TWICE DAILY AFTER A MEAL 60 tablet 2  . tamsulosin (FLOMAX) 0.4 MG CAPS capsule TAKE 1 CAPSULE BY MOUTH EVERY DAY AFTER SUPPER 90  capsule 1  . torsemide (DEMADEX) 20 MG tablet Take 40 mg by mouth daily. Takes 1 tablet daily     No facility-administered medications prior to visit.     Allergies  Allergen Reactions  . Morphine And Related Other (See Comments)    Drenched with perspiration  . Entresto [Sacubitril-Valsartan]     hypotension  . Demerol [Meperidine] Nausea Only  . Starlix [Nateglinide] Other (See Comments)    gassy    ROS As per HPI  PE: Blood pressure (!) 105/55, pulse 63, temperature 97.8 F (36.6 C), temperature source Temporal, resp. rate 18, height _0  (1.803 m), weight 234 lb (106.1 kg), SpO2 96 %. VS: noted--normal. Gen: alert, NAD, NONTOXIC APPEARING. HEENT: eyes without injection, drainage, or swelling.  Ears: EACs clear, TMs with normal light reflex and landmarks.  Nose: Yellowish, blood tinged mucous in both nares, R>L, with some dried, crusty exudate adherent to mildly injected mucosa.   Mild R paranasal sinus TTP.  No facial swelling.  Throat and mouth without focal lesion.  No pharyngial swelling, erythema, or exudate.   Neck: supple, no LAD.   LUNGS: CTA bilat on inspiration, trace end expiratory wheezing diffusely, exp phase not signif prolonged, nonlabored resps.   CV: RRR, no m/r/g. EXT: no clubbing or cyanosis.  He has 1+ bilat pitting edema. SKIN: no rash    LABS:    Chemistry      Component Value Date/Time   NA 139 12/04/2016 1253   K 4.5 12/04/2016 1253   CL 107 12/04/2016 1253   CO2 22 12/04/2016 1253   BUN 22 (H) 12/04/2016 1253   CREATININE 1.32 (H) 12/04/2016 1253   CREATININE 1.05 08/10/2016 0956      Component Value Date/Time   CALCIUM 9.0 12/04/2016 1253   ALKPHOS 81 08/27/2016 1630   AST 24 08/27/2016 1630   ALT 22 08/27/2016 1630   BILITOT 0.5 08/27/2016 1630     Lab Results  Component Value Date   CHOL 119  02/19/2016   HDL 43.30 02/19/2016   LDLCALC 44 02/19/2016   LDLDIRECT 187.0 11/21/2015   TRIG 159.0 (H) 02/19/2016   CHOLHDL 3 02/19/2016   Lab Results  Component Value Date   WBC 7.5 08/28/2016   HGB 12.8 (L) 08/28/2016   HCT 43.1 08/28/2016   MCV 84.2 08/28/2016   PLT 175 08/28/2016    IMPRESSION AND PLAN:  1) Acute sinusitis:  [place] otc. Nasal saline spray. Levaquin 500 mg qd x 7d.  2) Mild acute exacerbation of COPD. Prednisone 52m qd x 5d--reassess for further prednisone need at f/u in 5 days. Albuterol q6h prn. Levaquin as listed in #1 above. Oxygen monitoring at home--supplement with 2L [place] prn.  3) Depression: so far has had minimal response since titrating remeron to 455mdosing over the last 3-4 months. No adverse side effects from this med. Will continue this med and add low dose sertraline. Instructions: Take ONE of the 2581mertraline tabs per day for 2 weeks, then increase to 2 tabs every day. Keep taking your mirtazapine every day.  4) Combined systolic and diastolic HF: stable. Continue all current meds and appropriate cardiology/CHF clinic f/u.  5) PAF: he is in sinus today.  Continue bisoprolol and xarelto.  An After Visit Summary was printed and given to the patient.  FOLLOW UP: Return in about 5 days (around 12/13/2016).  Signed:  PhiCrissie SicklesD           12/08/2016

## 2016-12-08 NOTE — Patient Instructions (Signed)
Take ONE of the 69m sertraline tabs per day for 2 weeks, then increase to 2 tabs every day. Keep taking your mirtazapine every day.

## 2016-12-11 ENCOUNTER — Encounter: Payer: Self-pay | Admitting: Family Medicine

## 2016-12-11 ENCOUNTER — Ambulatory Visit (INDEPENDENT_AMBULATORY_CARE_PROVIDER_SITE_OTHER): Payer: Medicare Other | Admitting: Family Medicine

## 2016-12-11 VITALS — BP 94/43 | HR 63 | Temp 97.6°F | Resp 16 | Ht 71.0 in | Wt 230.0 lb

## 2016-12-11 DIAGNOSIS — J0100 Acute maxillary sinusitis, unspecified: Secondary | ICD-10-CM | POA: Diagnosis not present

## 2016-12-11 DIAGNOSIS — J441 Chronic obstructive pulmonary disease with (acute) exacerbation: Secondary | ICD-10-CM

## 2016-12-11 DIAGNOSIS — J111 Influenza due to unidentified influenza virus with other respiratory manifestations: Secondary | ICD-10-CM

## 2016-12-11 DIAGNOSIS — R69 Illness, unspecified: Secondary | ICD-10-CM

## 2016-12-11 LAB — POC INFLUENZA A&B (BINAX/QUICKVUE)
INFLUENZA A, POC: NEGATIVE
Influenza B, POC: NEGATIVE

## 2016-12-11 MED ORDER — HYOSCYAMINE SULFATE 0.125 MG SL SUBL: SUBLINGUAL_TABLET | SUBLINGUAL | 3 refills | Status: DC

## 2016-12-11 MED ORDER — PREDNISONE 20 MG PO TABS: ORAL_TABLET | ORAL | 0 refills | Status: DC

## 2016-12-11 NOTE — Progress Notes (Signed)
OFFICE VISIT  12/11/2016   CC:  Chief Complaint  Patient presents with  . Follow-up    COPD exacerbation   HPI:    Patient is a 76 y.o. Caucasian male who presents accompanied by his wife for f/u acute sinusitis and acute exac of COPD. Last visit 3 d/a I started levaquin and rx'd prednisone 71m qd x 5d, with plans of seeing him back for f/u in 5-6d. He presents today b/c he is not improving any.  Head/nasal congestion not improved.  No SOB or change in DOE.  No CP.  No wheezing.  No fever.  Not requiring supplemental oxygen at this time at home. No n/v. BMs loose last few days, few a day at the most  Started after he started the abx.  He does have IBS, though, so he can't really tell if these loose BMs are definitely related to his abx. No abd pain. No blood or pus.   Fatigue has increased, mild achiness is present.  Past Medical History:  Diagnosis Date  . Acute respiratory distress 06/04/2016  . Adenomatous colon polyp 10/16/2011   Repeat 2018  . Arthritis     Hips, R>L & KNEES  . CAD, multiple vessel    3V CAD cath 08/08/13----CABG done shortly after.  Cath 06/2016: clear bipasses/no interventional lesion.  . 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 pylori neg, no dysplasia or malignancy.  . Chronic combined systolic and diastolic CHF, NYHA class 2 ([facility] 2016-2018   Followed by CHF clinic  . Chronic renal insufficiency, stage 3 (moderate) (HCC) 2018   GFR 50s  . COPD (chronic obstructive pulmonary disease) ([place]    GOLD II.  Spirometry  2004 borderline obstruction; 2015 mod obst: noncompliant with bid symbicort so pulmonologist switched him to a once daily inhaler: Trelegy ellipta 12/2015.  Oxygen prn: goal 88-92%.  . Diabetic nephropathy ([place]    Elevated urine microalb/cr 03/2011  . DM type 2 (diabetes mellitus, type 2) ([place]    Poor control on max oral meds--pt eventually agreed to insulin therapy.  As of 2017 his DM is being managed by  Dr. KDwyane Deein endo.  . DOE (dyspnea on exertion)    COPD + chronic diastolic HF  . Erectile dysfunction    Normal testosterone  . Hearing loss of both ears 2016   Hearing aids  . Hyperlipidemia   . Hyperplastic colon polyp 2001   No further colonoscopies to be done as of 11/2016 GI eval.  . Hypertension   . IBS (irritable bowel syndrome)    responds well to levsin  . Iron deficiency anemia 2014   03/2012 capsule endoscopy showed 2 AVMs--likely responsible for his IDA--lifetime iron supp recommended + q624moBCs.  . Ischemic cardiomyopathy 06/2016; 09/2016   06/2016: EF 25-30%, global hypokinesis, grd II DD, vent septum motion changes c/w LBBB, mod MV regurg, L atr mod/sev dilat, R atr mod dilat, mod incr pulm press, Grd II DD.  09/2016--EF 40-45%; diffuse hypokinesis, Grd I DD.  . 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 Obesity   . Open toe wound 11/2015   Wound care clinic appt made and then canceled when toe improved.  . Other and unspecified angina pectoris   . PAD (peripheral artery disease) ([place]02/2018   Abnormal ABI's and waveforms: LE arterial duplex ordered for f/u as per cardiologist's recommendation.  Vasc eval by  Dr. Bridgett Larsson: impression was minimal PAD, recommended maximize med mgmt.  [name] PAF (paroxysmal atrial fibrillation) ([place]) 10/2014   xarelto  . Pericardial effusion with cardiac tamponade 6//29/15   pericardiocentesis was done,  Infectious/inflamm (cytology showed NO MALIGNANT CELLS)  . Pleural plaque    Pleural plaques/asbestosis changes on CT chest done by pulm 05/2016.  [name] Pneumonia   . Tobacco dependence in remission    100+ pack-yr hx: quit after CABG    Past Surgical History:  Procedure Laterality Date  . APPENDECTOMY  1957  . CARDIAC CATHETERIZATION  08/08/2013  . CATARACT EXTRACTION W/ INTRAOCULAR LENS  IMPLANT, BILATERAL  04/08/2006 & 04/22/2006  . CATARACT EXTRACTION W/ INTRAOCULAR LENS   IMPLANT, BILATERAL Bilateral   . CHOLECYSTECTOMY OPEN  1999  . COLONOSCOPY  10/16/2011   Procedure: COLONOSCOPY;  Surgeon: Inda Castle, MD;  [facility];  Service: Endoscopy;  Laterality: N/A;  . CORONARY ARTERY BYPASS GRAFT N/A 08/14/2013   Procedure: CORONARY ARTERY BYPASS GRAFTING (CABG);  Surgeon: Melrose Nakayama, MD;  Location: [place];  Service: Open Heart Surgery;  Laterality: N/A;  Times 4   using left internal mammary artery and endoscopically harvested bilateral saphenous vein  . ESOPHAGOGASTRODUODENOSCOPY  02/25/12   Atrophic gastritis with a few erosions--capsule endoscopy planned as of 02/25/12 (Dr. Deatra Ina).  [name] HARDWARE REMOVAL Right 08/12/2012   Procedure: HARDWARE REMOVAL;  Surgeon: Mcarthur Rossetti, MD;  [facility];  Service: Orthopedics;  Laterality: Right;  . HIP SURGERY Right 1954   Repair of slipped capital femoral epiphysis.  . INTRAOPERATIVE TRANSESOPHAGEAL ECHOCARDIOGRAM N/A 08/14/2013   Normal LV function. Procedure: INTRAOPERATIVE TRANSESOPHAGEAL ECHOCARDIOGRAM;  Surgeon: Melrose Nakayama, MD;  Location: [place];  Service: Open Heart Surgery;  Laterality: N/A;  . LEFT HEART CATHETERIZATION WITH CORONARY ANGIOGRAM N/A 08/08/2013   Procedure: LEFT HEART CATHETERIZATION WITH CORONARY ANGIOGRAM;  Surgeon: Jettie Booze, MD;  Location: [facility] CATH LAB;  Service: Cardiovascular;  Laterality: N/A;  . PATELLA FRACTURE SURGERY Left ~ 1979   bolt + 3 screws to repair tib plateau fx  . PERICARDIAL TAP N/A 07/01/2013   Procedure: PERICARDIAL TAP;  Surgeon: Jettie Booze, MD;  Location: [facility] CATH LAB;  Service: Cardiovascular;  Laterality: N/A;  . RIGHT/LEFT HEART CATH AND CORONARY ANGIOGRAPHY N/A 06/10/2016   Procedure: Right/Left Heart Cath and Coronary Angiography;  Surgeon: Larey Dresser, MD;  Location: [place] CV LAB;  Service: Cardiovascular;  Laterality: N/A;  . TESTICLE SURGERY  as a child   Undescended testicle brought down into scrotum   . TONSILLECTOMY  1947  . TOTAL HIP ARTHROPLASTY Right 08/12/2012   Procedure: REMOVAL OF OLD PINS RIGHT HIP AND RIGHT TOTAL HIP ARTHROPLASTY ANTERIOR APPROACH;  Surgeon: Mcarthur Rossetti, MD;  [facility];  Service: Orthopedics;  Laterality: Right;  . TRANSESOPHAGEAL ECHOCARDIOGRAM  09/22/2016   EF 40-45%; diffuse hypokinesis, Grd I DD.  [name] TRANSTHORACIC ECHOCARDIOGRAM  10/30/14   Mod LVH, EF 60-65%, normal wall motion, mod mitral regurg, mild PAH  . TRANSTHORACIC ECHOCARDIOGRAM  06/2016   06/2016: EF 25-30%, global hypokinesis, grd II DD, vent septum motion changes c/w LBBB, mod MV regurg, L atr mod/sev dilat, R atr mod dilat, mod incr pulm press, Grd II DD.    Outpatient Medications Prior to Visit  Medication Sig Dispense Refill  . ACCU-CHEK GUIDE test strip USE TO CHECK BLOOD SUGAR 3 TIMES DAILY 100 each 11  . acetaminophen (TYLENOL) 325 MG tablet Take 2 tablets (650 mg total)  by mouth every 4 (four) hours as needed for headache or mild pain.    . Ascorbic Acid (VITAMIN C) 1000 MG tablet Take 1,000 mg by mouth daily.    [name] atorvastatin (LIPITOR) 40 MG tablet TAKE ONE TABLET BY MOUTH EVERY DAY 90 tablet 1  . bisoprolol (ZEBETA) 5 MG tablet Take 1 tablet (5 mg total) by mouth daily. 30 tablet 6  . Cholecalciferol (VITAMIN D-3) 1000 units CAPS Take 1,000 Units by mouth daily. Reported on 02/26/2015    . digoxin (LANOXIN) 0.125 MG tablet Take 1 tablet (0.125 mg total) by mouth daily. 30 tablet 6  . fluticasone (FLONASE) 50 MCG/ACT nasal spray Place 2 sprays into both nostrils daily. 16 g 1  . gabapentin (NEURONTIN) 100 MG capsule Take 200 mg by mouth 3 (three) times daily.    [name] guaiFENesin (MUCINEX) 600 MG 12 hr tablet Take 2 tablets (1,200 mg total) by mouth 2 (two) times daily. 30 tablet 0  . Insulin Disposable Pump (V-GO 20) KIT Apply one Vgo 20 pump daily. 30 kit 3  . Insulin Glargine (TOUJEO MAX SOLOSTAR) 300 UNIT/ML SOPN Inject 50 Units into the skin 2 (two) times daily.    .  insulin regular human CONCENTRATED (HUMULIN R) 500 UNIT/ML injection Use 56 units daily in insulin pump 20 mL 3  . Insulin Syringe-Needle U-100 30G X 1/2" 1 ML MISC Use to fill insulin VGo pump one time daily 100 each 2  . isosorbide mononitrate (IMDUR) 30 MG 24 hr tablet TAKE ONE TABLET BY MOUTH EVERY DAY 90 tablet 1  . levofloxacin (LEVAQUIN) 500 MG tablet Take 1 tablet (500 mg total) by mouth daily. 7 tablet 0  . losartan (COZAAR) 25 MG tablet Take 1 tablet (25 mg total) 2 (two) times daily with a meal by mouth. 60 tablet 3  . mirtazapine (REMERON) 45 MG tablet Take 1 tablet (45 mg total) at bedtime by mouth. 30 tablet 1  . Multiple Vitamin (MULTIVITAMIN WITH MINERALS) TABS Take 1 tablet by mouth daily.    . nitroGLYCERIN (NITROSTAT) 0.4 MG SL tablet Place 1 tablet (0.4 mg total) under the tongue every 5 (five) minutes as needed for chest pain. 25 tablet 3  . OXYGEN Inhale 2 L into the lungs.    [name] PROAIR HFA 108 (90 Base) MCG/ACT inhaler INHALE 2 PUFFS INTO THE LUNGS EVERY 4 HOURS AS NEEDED FOR WHEEZING ORSHORTNESS OF BREATH 8.5 g 1  . rivaroxaban (XARELTO) 20 MG TABS tablet Take 1 tablet (20 mg total) by mouth daily with supper. 90 tablet 3  . sertraline (ZOLOFT) 25 MG tablet 2 tabs po qd 60 tablet 0  . spironolactone (ALDACTONE) 25 MG tablet Take 1 tablet (25 mg total) by mouth daily. 30 tablet 6  . SYMBICORT 160-4.5 MCG/ACT inhaler INHALE 2 PUFFS INTO THE LUNGS TWICE DAILY 10.2 g 5  . SYNJARDY 05-998 MG TABS TAKE ONE TABLET BY MOUTH TWICE DAILY AFTER A MEAL 60 tablet 2  . tamsulosin (FLOMAX) 0.4 MG CAPS capsule TAKE 1 CAPSULE BY MOUTH EVERY DAY AFTER SUPPER 90 capsule 1  . torsemide (DEMADEX) 20 MG tablet Take 40 mg by mouth daily. Takes 1 tablet daily    . hyoscyamine (LEVSIN SL) 0.125 MG SL tablet 1-2 tabs po q6h prn diarrhea 30 tablet 1  . predniSONE (DELTASONE) 20 MG tablet 2 tabs po qd x 5d 10 tablet 0   No facility-administered medications prior to visit.     Allergies  Allergen  Reactions  .  Morphine And Related Other (See Comments)    Drenched with perspiration  . Entresto [Sacubitril-Valsartan]     hypotension  . Demerol [Meperidine] Nausea Only  . Starlix [Nateglinide] Other (See Comments)    gassy    ROS As per HPI  PE: Blood pressure (!) 94/43, pulse 63, temperature 97.6 F (36.4 C), temperature source Oral, resp. rate 16, height _0  (1.803 m), weight 230 lb (104.3 kg), SpO2 96 %. Gen: alert, NAD.  Appears mildly tired but non-toxic.   AFFECT: pleasant, lucid thought and speech. CV; RRR, occ pause for 1 sec, S1 and S2 very distant.  No rub or gallop. LUNGS: diffuse, insp rhonchi with good aeration, with diffuse coarse exp wheeze and mildly prolonged exp phase. Nonlabored resps.  No excessive coughing with forced exhalation. EXT: 1+ pitting edema in both LE's.  LABS:    Chemistry      Component Value Date/Time   NA 139 12/04/2016 1253   K 4.5 12/04/2016 1253   CL 107 12/04/2016 1253   CO2 22 12/04/2016 1253   BUN 22 (H) 12/04/2016 1253   CREATININE 1.32 (H) 12/04/2016 1253   CREATININE 1.05 08/10/2016 0956      Component Value Date/Time   CALCIUM 9.0 12/04/2016 1253   ALKPHOS 81 08/27/2016 1630   AST 24 08/27/2016 1630   ALT 22 08/27/2016 1630   BILITOT 0.5 08/27/2016 1630     Lab Results  Component Value Date   WBC 7.5 08/28/2016   HGB 12.8 (L) 08/28/2016   HCT 43.1 08/28/2016   MCV 84.2 08/28/2016   PLT 175 08/28/2016   Rapid flu A/B today: NEGATIVE  IMPRESSION AND PLAN:  Acute exacerbation of COPD + acute sinusitis: No sign of any new problem contributing--he is just slow to improve. Will have him finish out his last few 60m prednisone at 60 mg qd.  Then take 484mqd pred x 5 more days, then 2066md x 5 more days.  Continue levaquin, add daily flonase.  An After Visit Summary was printed and given to the patient.  FOLLOW UP: Return for cancel 12/14/16 f/u appt: further f/u to be determined based on pt's improvement +  weather condition.  Signed:  PhiCrissie SicklesD           12/13/2016

## 2016-12-14 ENCOUNTER — Ambulatory Visit: Payer: Self-pay | Admitting: Family Medicine

## 2016-12-14 DIAGNOSIS — J9611 Chronic respiratory failure with hypoxia: Secondary | ICD-10-CM | POA: Diagnosis not present

## 2016-12-14 DIAGNOSIS — J449 Chronic obstructive pulmonary disease, unspecified: Secondary | ICD-10-CM | POA: Diagnosis not present

## 2016-12-14 DIAGNOSIS — I5032 Chronic diastolic (congestive) heart failure: Secondary | ICD-10-CM | POA: Diagnosis not present

## 2016-12-16 ENCOUNTER — Ambulatory Visit (INDEPENDENT_AMBULATORY_CARE_PROVIDER_SITE_OTHER): Payer: Medicare Other | Admitting: Family Medicine

## 2016-12-16 ENCOUNTER — Encounter: Payer: Self-pay | Admitting: Family Medicine

## 2016-12-16 VITALS — BP 146/61 | HR 67 | Temp 98.1°F | Resp 16 | Ht 71.0 in | Wt 230.8 lb

## 2016-12-16 DIAGNOSIS — J0100 Acute maxillary sinusitis, unspecified: Secondary | ICD-10-CM | POA: Diagnosis not present

## 2016-12-16 DIAGNOSIS — J441 Chronic obstructive pulmonary disease with (acute) exacerbation: Secondary | ICD-10-CM | POA: Diagnosis not present

## 2016-12-16 NOTE — Progress Notes (Signed)
OFFICE VISIT  12/16/2016   CC:  Chief Complaint  Patient presents with  . Follow-up    COPD exacerbation    HPI:    Patient is a 76 y.o. Caucasian male who presents for f/u acute exacerbation of COPD, with prolonged URI/sinusitis. Last visit 5 d/a he had not been improving much with 3 days of levaquin and prednisone.  Flu A/B neg. I had him just continue his levaquin and I briefly increased his prednisone and extended his taper, also recommended getting restarted on the flonase that he had stopped.  Good progress being made: URI sx's improving, some improvement in cough/DOE, also a bit less fatigued and achy. Eating and drinking well.  No fevers, no CP, no LE swelling.  Past Medical History:  Diagnosis Date  . Acute respiratory distress 06/04/2016  . Adenomatous colon polyp 10/16/2011   Repeat 2018  . Arthritis     Hips, R>L & KNEES  . CAD, multiple vessel    3V CAD cath 08/08/13----CABG done shortly after.  Cath 06/2016: clear bipasses/no interventional lesion.  . 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 pylori neg, no dysplasia or malignancy.  . Chronic combined systolic and diastolic CHF, NYHA class 2 ([place]) 2016-2018   Followed by CHF clinic  . Chronic renal insufficiency, stage 3 (moderate) (HCC) 2018   GFR 50s  . COPD (chronic obstructive pulmonary disease) ([place])    GOLD II.  Spirometry  2004 borderline obstruction; 2015 mod obst: noncompliant with bid symbicort so pulmonologist switched him to a once daily inhaler: Trelegy ellipta 12/2015.  Oxygen prn: goal 88-92%.  . Diabetic nephropathy ([place])    Elevated urine microalb/cr 03/2011  . DM type 2 (diabetes mellitus, type 2) ([place])    Poor control on max oral meds--pt eventually agreed to insulin therapy.  As of 2017 his DM is being managed by Dr. Dwyane Dee in endo.  . DOE (dyspnea on exertion)    COPD + chronic diastolic HF  . Erectile dysfunction    Normal testosterone  . Hearing loss of  both ears 2016   Hearing aids  . Hyperlipidemia   . Hyperplastic colon polyp 2001   No further colonoscopies to be done as of 11/2016 GI eval.  . Hypertension   . IBS (irritable bowel syndrome)    responds well to levsin  . Iron deficiency anemia 2014   03/2012 capsule endoscopy showed 2 AVMs--likely responsible for his IDA--lifetime iron supp recommended + q4moCBCs.  . Ischemic cardiomyopathy 06/2016; 09/2016   06/2016: EF 25-30%, global hypokinesis, grd II DD, vent septum motion changes c/w LBBB, mod MV regurg, L atr mod/sev dilat, R atr mod dilat, mod incr pulm press, Grd II DD.  09/2016--EF 40-45%; diffuse hypokinesis, Grd I DD.  . 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 Obesity   . Open toe wound 11/2015   Wound care clinic appt made and then canceled when toe improved.  . Other and unspecified angina pectoris   . PAD (peripheral artery disease) ([place] 02/2016   Abnormal ABI's and waveforms: LE arterial duplex ordered for f/u as per cardiologist's recommendation.  Vasc eval by Dr. CBridgett Larsson impression was minimal PAD, recommended maximize med mgmt.  .Marland KitchenPAF (paroxysmal atrial fibrillation) ([place] 10/2014   xarelto  . Pericardial effusion with cardiac tamponade 6//29/15   pericardiocentesis was done,  Infectious/inflamm (cytology showed NO MALIGNANT CELLS)  . Pleural  plaque    Pleural plaques/asbestosis changes on CT chest done by pulm 05/2016.  [name] Pneumonia   . Tobacco dependence in remission    100+ pack-yr hx: quit after CABG    Past Surgical History:  Procedure Laterality Date  . APPENDECTOMY  1957  . CARDIAC CATHETERIZATION  08/08/2013  . CATARACT EXTRACTION W/ INTRAOCULAR LENS  IMPLANT, BILATERAL  04/08/2006 & 04/22/2006  . CATARACT EXTRACTION W/ INTRAOCULAR LENS  IMPLANT, BILATERAL Bilateral   . CHOLECYSTECTOMY OPEN  1999  . COLONOSCOPY  10/16/2011   Procedure: COLONOSCOPY;  Surgeon: Inda Castle, MD;   [facility];  Service: Endoscopy;  Laterality: N/A;  . CORONARY ARTERY BYPASS GRAFT N/A 08/14/2013   Procedure: CORONARY ARTERY BYPASS GRAFTING (CABG);  Surgeon: Melrose Nakayama, MD;  Location: [place];  Service: Open Heart Surgery;  Laterality: N/A;  Times 4   using left internal mammary artery and endoscopically harvested bilateral saphenous vein  . ESOPHAGOGASTRODUODENOSCOPY  02/25/12   Atrophic gastritis with a few erosions--capsule endoscopy planned as of 02/25/12 (Dr. Deatra Ina).  [name] HARDWARE REMOVAL Right 08/12/2012   Procedure: HARDWARE REMOVAL;  Surgeon: Mcarthur Rossetti, MD;  [facility];  Service: Orthopedics;  Laterality: Right;  . HIP SURGERY Right 1954   Repair of slipped capital femoral epiphysis.  . INTRAOPERATIVE TRANSESOPHAGEAL ECHOCARDIOGRAM N/A 08/14/2013   Normal LV function. Procedure: INTRAOPERATIVE TRANSESOPHAGEAL ECHOCARDIOGRAM;  Surgeon: Melrose Nakayama, MD;  Location: [place];  Service: Open Heart Surgery;  Laterality: N/A;  . LEFT HEART CATHETERIZATION WITH CORONARY ANGIOGRAM N/A 08/08/2013   Procedure: LEFT HEART CATHETERIZATION WITH CORONARY ANGIOGRAM;  Surgeon: Jettie Booze, MD;  Location: [facility] CATH LAB;  Service: Cardiovascular;  Laterality: N/A;  . PATELLA FRACTURE SURGERY Left ~ 1979   bolt + 3 screws to repair tib plateau fx  . PERICARDIAL TAP N/A 07/01/2013   Procedure: PERICARDIAL TAP;  Surgeon: Jettie Booze, MD;  Location: [facility] CATH LAB;  Service: Cardiovascular;  Laterality: N/A;  . RIGHT/LEFT HEART CATH AND CORONARY ANGIOGRAPHY N/A 06/10/2016   Procedure: Right/Left Heart Cath and Coronary Angiography;  Surgeon: Larey Dresser, MD;  Location: [place] CV LAB;  Service: Cardiovascular;  Laterality: N/A;  . TESTICLE SURGERY  as a child   Undescended testicle brought down into scrotum  . TONSILLECTOMY  1947  . TOTAL HIP ARTHROPLASTY Right 08/12/2012   Procedure: REMOVAL OF OLD PINS RIGHT HIP AND RIGHT TOTAL HIP ARTHROPLASTY  ANTERIOR APPROACH;  Surgeon: Mcarthur Rossetti, MD;  [facility];  Service: Orthopedics;  Laterality: Right;  . TRANSESOPHAGEAL ECHOCARDIOGRAM  09/22/2016   EF 40-45%; diffuse hypokinesis, Grd I DD.  [name] TRANSTHORACIC ECHOCARDIOGRAM  10/30/14   Mod LVH, EF 60-65%, normal wall motion, mod mitral regurg, mild PAH  . TRANSTHORACIC ECHOCARDIOGRAM  06/2016   06/2016: EF 25-30%, global hypokinesis, grd II DD, vent septum motion changes c/w LBBB, mod MV regurg, L atr mod/sev dilat, R atr mod dilat, mod incr pulm press, Grd II DD.    Outpatient Medications Prior to Visit  Medication Sig Dispense Refill  . ACCU-CHEK GUIDE test strip USE TO CHECK BLOOD SUGAR 3 TIMES DAILY 100 each 11  . acetaminophen (TYLENOL) 325 MG tablet Take 2 tablets (650 mg total) by mouth every 4 (four) hours as needed for headache or mild pain.    . Ascorbic Acid (VITAMIN C) 1000 MG tablet Take 1,000 mg by mouth daily.    [name] atorvastatin (LIPITOR) 40 MG tablet TAKE ONE TABLET BY MOUTH  EVERY DAY 90 tablet 1  . bisoprolol (ZEBETA) 5 MG tablet Take 1 tablet (5 mg total) by mouth daily. 30 tablet 6  . Cholecalciferol (VITAMIN D-3) 1000 units CAPS Take 1,000 Units by mouth daily. Reported on 02/26/2015    . digoxin (LANOXIN) 0.125 MG tablet Take 1 tablet (0.125 mg total) by mouth daily. 30 tablet 6  . fluticasone (FLONASE) 50 MCG/ACT nasal spray Place 2 sprays into both nostrils daily. 16 g 1  . gabapentin (NEURONTIN) 100 MG capsule Take 200 mg by mouth 3 (three) times daily.    [name] guaiFENesin (MUCINEX) 600 MG 12 hr tablet Take 2 tablets (1,200 mg total) by mouth 2 (two) times daily. 30 tablet 0  . hyoscyamine (LEVSIN SL) 0.125 MG SL tablet 1-2 tabs po q6h prn diarrhea 60 tablet 3  . Insulin Disposable Pump (V-GO 20) KIT Apply one Vgo 20 pump daily. 30 kit 3  . Insulin Glargine (TOUJEO MAX SOLOSTAR) 300 UNIT/ML SOPN Inject 50 Units into the skin 2 (two) times daily.    . insulin regular human CONCENTRATED (HUMULIN R) 500 UNIT/ML  injection Use 56 units daily in insulin pump 20 mL 3  . Insulin Syringe-Needle U-100 30G X 1/2" 1 ML MISC Use to fill insulin VGo pump one time daily 100 each 2  . isosorbide mononitrate (IMDUR) 30 MG 24 hr tablet TAKE ONE TABLET BY MOUTH EVERY DAY 90 tablet 1  . losartan (COZAAR) 25 MG tablet Take 1 tablet (25 mg total) 2 (two) times daily with a meal by mouth. 60 tablet 3  . mirtazapine (REMERON) 45 MG tablet Take 1 tablet (45 mg total) at bedtime by mouth. 30 tablet 1  . Multiple Vitamin (MULTIVITAMIN WITH MINERALS) TABS Take 1 tablet by mouth daily.    . nitroGLYCERIN (NITROSTAT) 0.4 MG SL tablet Place 1 tablet (0.4 mg total) under the tongue every 5 (five) minutes as needed for chest pain. 25 tablet 3  . OXYGEN Inhale 2 L into the lungs.    . predniSONE (DELTASONE) 20 MG tablet 2 tabs po qd x 5d, then 1 tab po qd x 5d 15 tablet 0  . PROAIR HFA 108 (90 Base) MCG/ACT inhaler INHALE 2 PUFFS INTO THE LUNGS EVERY 4 HOURS AS NEEDED FOR WHEEZING ORSHORTNESS OF BREATH 8.5 g 1  . rivaroxaban (XARELTO) 20 MG TABS tablet Take 1 tablet (20 mg total) by mouth daily with supper. 90 tablet 3  . sertraline (ZOLOFT) 25 MG tablet 2 tabs po qd 60 tablet 0  . spironolactone (ALDACTONE) 25 MG tablet Take 1 tablet (25 mg total) by mouth daily. 30 tablet 6  . SYMBICORT 160-4.5 MCG/ACT inhaler INHALE 2 PUFFS INTO THE LUNGS TWICE DAILY 10.2 g 5  . SYNJARDY 05-998 MG TABS TAKE ONE TABLET BY MOUTH TWICE DAILY AFTER A MEAL 60 tablet 2  . tamsulosin (FLOMAX) 0.4 MG CAPS capsule TAKE 1 CAPSULE BY MOUTH EVERY DAY AFTER SUPPER 90 capsule 1  . torsemide (DEMADEX) 20 MG tablet Take 40 mg by mouth daily. Takes 1 tablet daily    . OXYGEN Inhale 2 L into the lungs.    [name] levofloxacin (LEVAQUIN) 500 MG tablet Take 1 tablet (500 mg total) by mouth daily. (Patient not taking: Reported on 12/16/2016) 7 tablet 0   No facility-administered medications prior to visit.     Allergies  Allergen Reactions  . Morphine And Related Other  (See Comments)    Drenched with perspiration  . Entresto [Sacubitril-Valsartan]  hypotension  . Demerol [Meperidine] Nausea Only  . Starlix [Nateglinide] Other (See Comments)    gassy    ROS As per HPI  PE: Blood pressure (!) 146/61, pulse 67, temperature 98.1 F (36.7 C), temperature source Oral, resp. rate 16, height _0  (1.803 m), weight 230 lb 12 oz (104.7 kg), SpO2 92 %. Gen: Alert, well appearing.  Patient is oriented to person, place, time, and situation. AFFECT: pleasant, lucid thought and speech. CV: RRR, no m/r/g.   LUNGS: CTA bilat, nonlabored resps, good aeration in all lung fields. EXT: trace to 1+ pitting edema in LLs.  No cyanosis or clubbing.  LABS:    Chemistry      Component Value Date/Time   NA 139 12/04/2016 1253   K 4.5 12/04/2016 1253   CL 107 12/04/2016 1253   CO2 22 12/04/2016 1253   BUN 22 (H) 12/04/2016 1253   CREATININE 1.32 (H) 12/04/2016 1253   CREATININE 1.05 08/10/2016 0956      Component Value Date/Time   CALCIUM 9.0 12/04/2016 1253   ALKPHOS 81 08/27/2016 1630   AST 24 08/27/2016 1630   ALT 22 08/27/2016 1630   BILITOT 0.5 08/27/2016 1630     Lab Results  Component Value Date   WBC 7.5 08/28/2016   HGB 12.8 (L) 08/28/2016   HCT 43.1 08/28/2016   MCV 84.2 08/28/2016   PLT 175 08/28/2016    IMPRESSION AND PLAN:  COPD exacerbation with acute sinusitis. He is significantly improved over the last 5d. Finish out prednisone taper, use albuterol q6h prn, oxygen prn. Expect gradual complete return to his baseling in 5-10d. Signs/symptoms to call or return for were reviewed and pt expressed understanding.  He has f/u with neuro and endo 01/2016, f/u with cardiology 02/2016.  An After Visit Summary was printed and given to the patient.  FOLLOW UP: Return in about 4 months (around 04/16/2017) for routine chronic illness f/u (30 min).  Signed:  Crissie Sickles, MD           12/16/2016

## 2017-01-08 ENCOUNTER — Ambulatory Visit: Payer: Medicare Other | Admitting: Orthotics

## 2017-01-08 ENCOUNTER — Ambulatory Visit: Payer: Medicare Other | Admitting: Podiatry

## 2017-01-11 ENCOUNTER — Other Ambulatory Visit: Payer: Self-pay | Admitting: Family Medicine

## 2017-01-11 ENCOUNTER — Other Ambulatory Visit: Payer: Medicare Other

## 2017-01-11 ENCOUNTER — Ambulatory Visit: Payer: Medicare Other | Admitting: Neurology

## 2017-01-11 ENCOUNTER — Encounter: Payer: Self-pay | Admitting: Neurology

## 2017-01-11 ENCOUNTER — Other Ambulatory Visit: Payer: Self-pay

## 2017-01-11 VITALS — BP 132/46 | HR 82 | Ht 71.5 in | Wt 238.0 lb

## 2017-01-11 DIAGNOSIS — F321 Major depressive disorder, single episode, moderate: Secondary | ICD-10-CM

## 2017-01-11 DIAGNOSIS — E1165 Type 2 diabetes mellitus with hyperglycemia: Secondary | ICD-10-CM | POA: Diagnosis not present

## 2017-01-11 DIAGNOSIS — G3184 Mild cognitive impairment, so stated: Secondary | ICD-10-CM | POA: Diagnosis not present

## 2017-01-11 DIAGNOSIS — Z794 Long term (current) use of insulin: Secondary | ICD-10-CM | POA: Diagnosis not present

## 2017-01-11 NOTE — Progress Notes (Signed)
NEUROLOGY CONSULTATION NOTE  Joshua [name] MRN: [id number] DOB: 03-Oct-1940  Referring provider: Dr. Shawnie Dapper Primary care provider: Dr. Shawnie Dapper  Reason for consult:  Memory   Dear Dr Anitra Lauth:  Thank you for your kind referral of Joshua [name] for consultation of the above symptoms. Although his history is well known to you, please allow me to reiterate it for the purpose of our medical record. The patient was accompanied to the clinic by his wife who also provides collateral information. Records and images were personally reviewed where available.   HISTORY OF PRESENT ILLNESS: This is a pleasant 77 year old right-handed man with a history of hypertension, hyperlipidemia, diabetes, CAD, CHF, COPD, paroxysmal atrial fibrillation, PAD, presenting for evaluation of memory and mood changes. A neurology referral was suggested by his psychologist, he has had moderate depression which his wife reports is unchanged despite all the medications he is taking. He is not seeing a psychiatrist or therapist any longer. He reports his memory is "not real good, not the best." He started noticing changes a couple of years ago. His wife feels the changes started after his open heart surgery in 2015. Within a year, he was more forgetful, he would try to say something but it would take forever to get it out. He denies getting lost driving, but his wife shakes her head and reminds him of two times he went to the doctor's office and ended up on the other side of town.  His wife fixes his pillbox every week, he is overall pretty good with remembering to take them. His wife is in charge of finances. The mood changes also started around that time. He has irritability and is "just plain hateful." He does not have any patience at all. He is "easy to get disgusted." His wife states that in a lot of ways, he is a whole different person.  He denies any headaches, dizziness (except when getting up too fast),  diplopia, dysarthria/dysphagia, neck/back pain, bowel/bladder dysfunction. Sleep is good. He is independent with dressing and bathing. No hallucinations or paranoia. He has tingling in both feet and takes gabapentin for diabetic neuropathy. His legs are weak, no recent falls. He has mild tremor in both hands, more noticeable when writing with his right hand. No family history of dementia. He denies any history of significant head injuries, no alcohol use.   Laboratory Data: Lab Results  Component Value Date   TSH 1.612 08/28/2016     PAST MEDICAL HISTORY: Past Medical History:  Diagnosis Date  . Acute respiratory distress 06/04/2016  . Adenomatous colon polyp 10/16/2011   Repeat 2018  . Arthritis     Hips, R>L & KNEES  . CAD, multiple vessel    3V CAD cath 08/08/13----CABG done shortly after.  Cath 06/2016: clear bipasses/no interventional lesion.  . 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 pylori neg, no dysplasia or malignancy.  . Chronic combined systolic and diastolic CHF, NYHA class 2 ([place]) 2016-2018   Followed by CHF clinic  . Chronic renal insufficiency, stage 3 (moderate) (HCC) 2018   GFR 50s  . COPD (chronic obstructive pulmonary disease) ([place])    GOLD II.  Spirometry  2004 borderline obstruction; 2015 mod obst: noncompliant with bid symbicort so pulmonologist switched him to a once daily inhaler: Trelegy ellipta 12/2015.  Oxygen prn: goal 88-92%.  . Diabetic nephropathy ([place])    Elevated urine microalb/cr 03/2011  . DM type 2 (diabetes mellitus,  type 2) ([place])    Poor control on max oral meds--pt eventually agreed to insulin therapy.  As of 2017 his DM is being managed by Dr. Dwyane Dee in endo.  . DOE (dyspnea on exertion)    COPD + chronic diastolic HF  . Erectile dysfunction    Normal testosterone  . Hearing loss of both ears 2016   Hearing aids  . Hyperlipidemia   . Hyperplastic colon polyp 2001   No further colonoscopies to be done as of  11/2016 GI eval.  . Hypertension   . IBS (irritable bowel syndrome)    responds well to levsin  . Iron deficiency anemia 2014   03/2012 capsule endoscopy showed 2 AVMs--likely responsible for his IDA--lifetime iron supp recommended + q4moCBCs.  . Ischemic cardiomyopathy 06/2016; 09/2016   06/2016: EF 25-30%, global hypokinesis, grd II DD, vent septum motion changes c/w LBBB, mod MV regurg, L atr mod/sev dilat, R atr mod dilat, mod incr pulm press, Grd II DD.  09/2016--EF 40-45%; diffuse hypokinesis, Grd I DD.  . Macular degeneration, dry    Mild, bilat (Optometrist, DMayford Knifeat [employer]  . Obesity   . Open toe wound 11/2015   Wound care clinic appt made and then canceled when toe improved.  . Other and unspecified angina pectoris   . PAD (peripheral artery disease) ([place] 02/2016   Abnormal ABI's and waveforms: LE arterial duplex ordered for f/u as per cardiologist's recommendation.  Vasc eval by Dr. CBridgett Larsson impression was minimal PAD, recommended maximize med mgmt.  .Marland KitchenPAF (paroxysmal atrial fibrillation) ([place] 10/2014   xarelto  . Pericardial effusion with cardiac tamponade 6//29/15   pericardiocentesis was done,  Infectious/inflamm (cytology showed NO MALIGNANT CELLS)  . Pleural plaque    Pleural plaques/asbestosis changes on CT chest done by pulm 05/2016.  .Marland KitchenPneumonia   . Tobacco dependence in remission    100+ pack-yr hx: quit after CABG    PAST SURGICAL HISTORY: Past Surgical History:  Procedure Laterality Date  . APPENDECTOMY  1957  . CARDIAC CATHETERIZATION  08/08/2013  . CATARACT EXTRACTION W/ INTRAOCULAR LENS  IMPLANT, BILATERAL  04/08/2006 & 04/22/2006  . CATARACT EXTRACTION W/ INTRAOCULAR LENS  IMPLANT, BILATERAL Bilateral   . CHOLECYSTECTOMY OPEN  1999  . COLONOSCOPY  10/16/2011   Procedure: COLONOSCOPY;  Surgeon: RInda Castle MD;  Location: [facility];  Service: Endoscopy;  Laterality: N/A;  . CORONARY ARTERY BYPASS GRAFT N/A 08/14/2013    Procedure: CORONARY ARTERY BYPASS GRAFTING (CABG);  Surgeon: SMelrose Nakayama MD;  [facility]  Service: Open Heart Surgery;  Laterality: N/A;  Times 4   using left internal mammary artery and endoscopically harvested bilateral saphenous vein  . ESOPHAGOGASTRODUODENOSCOPY  02/25/12   Atrophic gastritis with a few erosions--capsule endoscopy planned as of 02/25/12 (Dr. KDeatra Ina.  .Marland KitchenHARDWARE REMOVAL Right 08/12/2012   Procedure: HARDWARE REMOVAL;  Surgeon: CMcarthur Rossetti MD;  Location: [facility];  Service: Orthopedics;  Laterality: Right;  . HIP SURGERY Right 1954   Repair of slipped capital femoral epiphysis.  . INTRAOPERATIVE TRANSESOPHAGEAL ECHOCARDIOGRAM N/A 08/14/2013   Normal LV function. Procedure: INTRAOPERATIVE TRANSESOPHAGEAL ECHOCARDIOGRAM;  Surgeon: SMelrose Nakayama MD;  [place] [facility]  Service: Open Heart Surgery;  Laterality: N/A;  . LEFT HEART CATHETERIZATION WITH CORONARY ANGIOGRAM N/A 08/08/2013   Procedure: LEFT HEART CATHETERIZATION WITH CORONARY ANGIOGRAM;  Surgeon: JJettie Booze MD;  [facility];  Service: Cardiovascular;  Laterality: N/A;  .  PATELLA FRACTURE SURGERY Left ~ 1979   bolt + 3 screws to repair tib plateau fx  . PERICARDIAL TAP N/A 07/01/2013   Procedure: PERICARDIAL TAP;  Surgeon: Jettie Booze, MD;  Location: [facility] CATH LAB;  Service: Cardiovascular;  Laterality: N/A;  . RIGHT/LEFT HEART CATH AND CORONARY ANGIOGRAPHY N/A 06/10/2016   Procedure: Right/Left Heart Cath and Coronary Angiography;  Surgeon: Larey Dresser, MD;  Location: [facility] CV LAB;  Service: Cardiovascular;  Laterality: N/A;  . TESTICLE SURGERY  as a child   Undescended testicle brought down into scrotum  . TONSILLECTOMY  1947  . TOTAL HIP ARTHROPLASTY Right 08/12/2012   Procedure: REMOVAL OF OLD PINS RIGHT HIP AND RIGHT TOTAL HIP ARTHROPLASTY ANTERIOR APPROACH;  Surgeon: Mcarthur Rossetti, MD;  Location: [facility];  Service: Orthopedics;  Laterality: Right;    . TRANSESOPHAGEAL ECHOCARDIOGRAM  09/22/2016   EF 40-45%; diffuse hypokinesis, Grd I DD.  [name] TRANSTHORACIC ECHOCARDIOGRAM  10/30/14   Mod LVH, EF 60-65%, normal wall motion, mod mitral regurg, mild PAH  . TRANSTHORACIC ECHOCARDIOGRAM  06/2016   06/2016: EF 25-30%, global hypokinesis, grd II DD, vent septum motion changes c/w LBBB, mod MV regurg, L atr mod/sev dilat, R atr mod dilat, mod incr pulm press, Grd II DD.    MEDICATIONS: Current Outpatient Medications on File Prior to Visit  Medication Sig Dispense Refill  . ACCU-CHEK GUIDE test strip USE TO CHECK BLOOD SUGAR 3 TIMES DAILY 100 each 11  . acetaminophen (TYLENOL) 325 MG tablet Take 2 tablets (650 mg total) by mouth every 4 (four) hours as needed for headache or mild pain.    . Ascorbic Acid (VITAMIN C) 1000 MG tablet Take 1,000 mg by mouth daily.    [name] atorvastatin (LIPITOR) 40 MG tablet TAKE ONE TABLET BY MOUTH EVERY DAY 90 tablet 1  . bisoprolol (ZEBETA) 5 MG tablet Take 1 tablet (5 mg total) by mouth daily. 30 tablet 6  . Cholecalciferol (VITAMIN D-3) 1000 units CAPS Take 1,000 Units by mouth daily. Reported on 02/26/2015    . digoxin (LANOXIN) 0.125 MG tablet Take 1 tablet (0.125 mg total) by mouth daily. 30 tablet 6  . fluticasone (FLONASE) 50 MCG/ACT nasal spray Place 2 sprays into both nostrils daily. 16 g 1  . gabapentin (NEURONTIN) 100 MG capsule Take 200 mg by mouth 3 (three) times daily.    [name] guaiFENesin (MUCINEX) 600 MG 12 hr tablet Take 2 tablets (1,200 mg total) by mouth 2 (two) times daily. 30 tablet 0  . hyoscyamine (LEVSIN SL) 0.125 MG SL tablet 1-2 tabs po q6h prn diarrhea 60 tablet 3  . Insulin Disposable Pump (V-GO 20) KIT Apply one Vgo 20 pump daily. 30 kit 3  . Insulin Glargine (TOUJEO MAX SOLOSTAR) 300 UNIT/ML SOPN Inject 50 Units into the skin 2 (two) times daily.    . insulin regular human CONCENTRATED (HUMULIN R) 500 UNIT/ML injection Use 56 units daily in insulin pump 20 mL 3  . Insulin Syringe-Needle U-100  30G X 1/2" 1 ML MISC Use to fill insulin VGo pump one time daily 100 each 2  . isosorbide mononitrate (IMDUR) 30 MG 24 hr tablet TAKE ONE TABLET BY MOUTH EVERY DAY 90 tablet 1  . losartan (COZAAR) 25 MG tablet Take 1 tablet (25 mg total) 2 (two) times daily with a meal by mouth. 60 tablet 3  . mirtazapine (REMERON) 45 MG tablet Take 1 tablet (45 mg total) at bedtime by mouth. 30 tablet 1  .  Multiple Vitamin (MULTIVITAMIN WITH MINERALS) TABS Take 1 tablet by mouth daily.    . nitroGLYCERIN (NITROSTAT) 0.4 MG SL tablet Place 1 tablet (0.4 mg total) under the tongue every 5 (five) minutes as needed for chest pain. 25 tablet 3  . OXYGEN Inhale 2 L into the lungs.    . predniSONE (DELTASONE) 20 MG tablet 2 tabs po qd x 5d, then 1 tab po qd x 5d 15 tablet 0  . PROAIR HFA 108 (90 Base) MCG/ACT inhaler INHALE 2 PUFFS INTO THE LUNGS EVERY 4 HOURS AS NEEDED FOR WHEEZING ORSHORTNESS OF BREATH 8.5 g 1  . rivaroxaban (XARELTO) 20 MG TABS tablet Take 1 tablet (20 mg total) by mouth daily with supper. 90 tablet 3  . sertraline (ZOLOFT) 25 MG tablet 2 tabs po qd 60 tablet 0  . spironolactone (ALDACTONE) 25 MG tablet Take 1 tablet (25 mg total) by mouth daily. 30 tablet 6  . SYMBICORT 160-4.5 MCG/ACT inhaler INHALE 2 PUFFS INTO THE LUNGS TWICE DAILY 10.2 g 5  . SYNJARDY 05-998 MG TABS TAKE ONE TABLET BY MOUTH TWICE DAILY AFTER A MEAL 60 tablet 2  . tamsulosin (FLOMAX) 0.4 MG CAPS capsule TAKE 1 CAPSULE BY MOUTH EVERY DAY AFTER SUPPER 90 capsule 1  . torsemide (DEMADEX) 20 MG tablet Take 40 mg by mouth daily. Takes 1 tablet daily     No current facility-administered medications on file prior to visit.     ALLERGIES: Allergies  Allergen Reactions  . Morphine And Related Other (See Comments)    Drenched with perspiration  . Entresto [Sacubitril-Valsartan]     hypotension  . Demerol [Meperidine] Nausea Only  . Starlix [Nateglinide] Other (See Comments)    gassy    FAMILY HISTORY: Family History  Problem  Relation Age of Onset  . Heart disease Father   . Heart attack Father   . CVA Mother   . Hypertension Mother   . Diabetes Paternal Grandmother   . Breast cancer Sister   . Diabetes Maternal Uncle   . Heart disease Maternal Uncle   . Stroke Neg Hx     SOCIAL HISTORY: Social History   Socioeconomic History  . Marital status: Married    Spouse name: [name]  . Number of children: 4  . Years of education: Not on file  . Highest education level: Not on file  Social Needs  . Financial resource strain: Not on file  . Food insecurity - worry: Not on file  . Food insecurity - inability: Not on file  . Transportation needs - medical: Not on file  . Transportation needs - non-medical: Not on file  Occupational History  . Occupation: retired, former Scientist, forensic  Tobacco Use  . Smoking status: Former Smoker    Packs/day: 0.25    Years: 62.00    Pack years: 15.50    Types: Cigarettes    Last attempt to quit: 10/23/2015    Years since quitting: 1.2  . Smokeless tobacco: Former Systems developer    Quit date: 06/30/2013  Substance and Sexual Activity  . Alcohol use: No    Alcohol/week: 0.0 oz  . Drug use: No  . Sexual activity: No  Other Topics Concern  . Not on file  Social History Narrative   Married, 4 children.   Formerly a Medical illustrator.   Level of education: HS.     +tobacco--lifelong/ quit 2015).  No alcohol or drugs.   No exercise.  +excessive caffeine.    REVIEW  OF SYSTEMS: Constitutional: No fevers, chills, or sweats, no generalized fatigue, change in appetite Eyes: No visual changes, double vision, eye pain Ear, nose and throat: No hearing loss, ear pain, nasal congestion, sore throat Cardiovascular: No chest pain, palpitations Respiratory:  No shortness of breath at rest or with exertion, wheezes GastrointestinaI: No nausea, vomiting, diarrhea, abdominal pain, fecal incontinence Genitourinary:  No dysuria, urinary retention or frequency Musculoskeletal:   No neck pain, back pain Integumentary: No rash, pruritus, skin lesions Neurological: as above Psychiatric: + depression, no insomnia, anxiety Endocrine: No palpitations, fatigue, diaphoresis, mood swings, change in appetite, change in weight, increased thirst Hematologic/Lymphatic:  No anemia, purpura, petechiae. Allergic/Immunologic: no itchy/runny eyes, nasal congestion, recent allergic reactions, rashes  PHYSICAL EXAM: Vitals:   01/11/17 0902  BP: (!) 132/46  Pulse: 82  SpO2: 93%   General: No acute distress Head:  Normocephalic/atraumatic Eyes: Fundoscopic exam shows bilateral sharp discs, no vessel changes, exudates, or hemorrhages Neck: supple, no paraspinal tenderness, full range of motion Back: No paraspinal tenderness Heart: regular rate and rhythm Lungs: Clear to auscultation bilaterally. Vascular: No carotid bruits. Skin/Extremities: No rash, no edema Neurological Exam: Mental status: alert and oriented to person, place, and time, no dysarthria or aphasia, Fund of knowledge is appropriate.  Recent and remote memory are intact.  Attention and concentration are normal.    Able to name objects and repeat phrases. CDT 5/5 MMSE - Mini Mental State Exam 01/11/2017 05/18/2016  Orientation to time 5 5  Orientation to Place 5 5  Registration 3 3  Attention/ Calculation 5 5  Recall 2 3  Language- name 2 objects 2 2  Language- repeat 1 1  Language- follow 3 step command 3 3  Language- read & follow direction 1 1  Write a sentence 1 1  Copy design 1 1  Total score 29 30   Cranial nerves: CN I: not tested CN II: pupils equal, round and reactive to light, visual fields intact, fundi unremarkable. CN III, IV, VI:  full range of motion, no nystagmus, no ptosis CN V: facial sensation intact CN VII: upper and lower face symmetric CN VIII: hearing intact to finger rub CN IX, X: gag intact, uvula midline CN XI: sternocleidomastoid and trapezius muscles intact CN XII: tongue  midline Bulk & Tone: normal, no cogwheeling, no fasciculations. Motor: 5/5 throughout with no pronator drift. Sensation: intact to light touch, cold, pin, vibration and joint position sense.  No extinction to double simultaneous stimulation.  Romberg test negative Deep Tendon Reflexes: +2 throughout except for absent ankle jerks bilaterally, no ankle clonus Plantar responses: downgoing bilaterally Cerebellar: no incoordination on finger to nose testing Gait: limps with the left leg (stiff), chronic due to left hip issues, unable to tandem walk Tremor: no resting tremor, +mild bilateral postural and endpoint tremor  IMPRESSION: This is a 77 year old right-handed man with a vascular risk factors including hypertension, hyperlipidemia, diabetes, CAD, CHF, paroxysmal atrial fibrillation, PAD, presenting for evaluation of memory and mood changes. His psychologist recommended a neurology referral due to refractory depression, he has tried several medications but his wife has not noticed any improvement. His neurological exam is largely non-focal, MMSE today normal 29/30. We discussed different causes of memory loss, check B12. MRI brain without contrast will be ordered to assess for underlying structural abnormality and assess vascular load. He will be scheduled for Neurocognitive testing to further evaluate memory and mood complaints. We discussed that continued follow-up with Behavioral Health would still be beneficial.  We also discussed the importance of control of vascular risk factors, physical exercise, and brain stimulation exercises for brain health. He will follow-up after the tests and knows to call for any changes.   Thank you for allowing me to participate in the care of this patient. Please do not hesitate to call for any questions or concerns.   Ellouise Newer, M.D.  CC: Dr. Anitra Lauth

## 2017-01-11 NOTE — Patient Instructions (Addendum)
1. Bloodwork for B12  Your provider has requested that you have labwork completed today. Please go to [facility] ([id number]) on the second floor of this building before leaving the office today. You do not need to check in. If you are not called within 15 minutes please check with the front desk.   2. Schedule MRI brain without contrast  We have sent a referral to [facility] for your MRI and they will call you directly to schedule your appt. They are located at [place]. If you need to contact them directly please call [phone].   3. Schedule Neurocognitive testing with Dr. Si Raider 4. Follow-up after tests   RECOMMENDATIONS FOR ALL PATIENTS WITH MEMORY PROBLEMS: 1. Continue to exercise (Recommend 30 minutes of walking everyday, or 3 hours every week) 2. Increase social interactions - continue going to [place] and enjoy social gatherings with friends and family 3. Eat healthy, avoid fried foods and eat more fruits and vegetables 4. Maintain adequate blood pressure, blood sugar, and blood cholesterol level. Reducing the risk of stroke and cardiovascular disease also helps promoting better memory. 5. Avoid stressful situations. Live a simple life and avoid aggravations. Organize your time and prepare for the next day in anticipation. 6. Sleep well, avoid any interruptions of sleep and avoid any distractions in the bedroom that may interfere with adequate sleep quality 7. Avoid sugar, avoid sweets as there is a strong link between excessive sugar intake, diabetes, and cognitive impairment We discussed the Mediterranean diet, which has been shown to help patients reduce the risk of progressive memory disorders and reduces cardiovascular risk. This includes eating fish, eat fruits and green leafy vegetables, nuts like almonds and hazelnuts, walnuts, and also use olive oil. Avoid fast foods and fried foods as much as possible. Avoid sweets and sugar as sugar use has been  linked to worsening of memory function.

## 2017-01-12 ENCOUNTER — Ambulatory Visit: Payer: Medicare Other | Admitting: Endocrinology

## 2017-01-12 ENCOUNTER — Encounter: Payer: Self-pay | Admitting: Endocrinology

## 2017-01-12 VITALS — BP 128/58 | HR 91 | Ht 71.0 in | Wt 236.0 lb

## 2017-01-12 DIAGNOSIS — Z794 Long term (current) use of insulin: Secondary | ICD-10-CM | POA: Diagnosis not present

## 2017-01-12 DIAGNOSIS — E1165 Type 2 diabetes mellitus with hyperglycemia: Secondary | ICD-10-CM | POA: Diagnosis not present

## 2017-01-12 LAB — COMPLETE METABOLIC PANEL WITH GFR
AG RATIO: 1.7 (calc) (ref 1.0–2.5)
ALBUMIN MSPROF: 4 g/dL (ref 3.6–5.1)
ALKALINE PHOSPHATASE (APISO): 70 U/L (ref 40–115)
ALT: 21 U/L (ref 9–46)
AST: 21 U/L (ref 10–35)
BUN / CREAT RATIO: 17 (calc) (ref 6–22)
BUN: 21 mg/dL (ref 7–25)
CALCIUM: 9.2 mg/dL (ref 8.6–10.3)
CO2: 22 mmol/L (ref 20–32)
CREATININE: 1.22 mg/dL — AB (ref 0.70–1.18)
Chloride: 103 mmol/L (ref 98–110)
GFR, EST NON AFRICAN AMERICAN: 57 mL/min/{1.73_m2} — AB (ref >=60)
GFR, Est African American: 66 mL/min/{1.73_m2} (ref >=60)
GLOBULIN: 2.3 g/dL (ref 1.9–3.7)
Glucose, Bld: 124 mg/dL — ABNORMAL HIGH (ref 65–99)
POTASSIUM: 4.4 mmol/L (ref 3.5–5.3)
SODIUM: 145 mmol/L (ref 135–146)
Total Bilirubin: 0.3 mg/dL (ref 0.2–1.2)
Total Protein: 6.3 g/dL (ref 6.1–8.1)

## 2017-01-12 LAB — TEST AUTHORIZATION

## 2017-01-12 LAB — TIQ-NTM

## 2017-01-12 LAB — VITAMIN B12: Vitamin B-12: 381 pg/mL (ref 200–1100)

## 2017-01-12 LAB — POCT GLYCOSYLATED HEMOGLOBIN (HGB A1C): Hemoglobin A1C: 9.5

## 2017-01-12 NOTE — Progress Notes (Signed)
Patient ID: Joshua [name], male   DOB: Apr 10, 1940, 77 y.o.   MRN: [id number]            Reason for Appointment:  Follow-up for Type 2 Diabetes  Referring physician: McG[place]en   History of Present Illness:          Date of diagnosis of type 2 diabetes mellitus: ?  2002        Background history:   He apparently had been treated with metformin initially which was continued until about 2016. He previously has also been tried on Amaryl, Actos, Farxiga and Victoza but he claims that these did not help his blood sugar and were stopped Appears to have been on insulin since about 2014 but he does not remember details. He has been taking mostly Levemir insulin as a basal insulin but was also on Lantus in 2015 for some time His best A1c has been 7.3, once in 2014 and another time in 2016, otherwise his A1c has been higher and as high as 10.8  Recent history:   INSULIN regimen U-500 insulin with V-go 20 units basal, bolus clicks: 8-4-0 at meals    Non-insulin hypoglycemic drugs the patient is taking are: Synjardy 05/998 twice a day  His last A1c was down to 7.3 in May 2018 and is now consistently over 9%, today 9.5  Current management, blood sugar patterns and problems identified:  He was started on the V-go pump in late October 2018 with the U-500 insulin  Although his blood sugars looked significantly better on his initial follow-up his A1c is surprisingly not improved  Difficult to assess his blood sugar patterns as he is checking only FASTING blood sugars  He does not understand the need to check readings after meals or even at bedtime  He says that he may have gone off his diet over the holidays and recently is getting back on desserts like ice cream  He is concerned about the cost of his U-500 insulin  Despite recommendations and reminders he is taking his BOLUS clicks after eating instead of before  Apparently last month he had marked increase in blood sugars to as much as 509,  once this was related to his forgetting to change the pump the evening before but not clear why his blood sugars were over 400 about 4 times   but his blood sugars were poorly controlled with the 40 unit and the U-100 insulin  For the last 2 weeks or so he has been not using the U-500 insulin and a 20 unit basal  Although previously blood sugars were over frequently over 400 to near normal in the mornings recently  Has only one relatively low blood sugar of 65, asymptomatic  RECENT fasting blood sugars are excellent with a range of about 81-118  Unable to exercise because of various physical limitations    Side effects from medications have been: None  Compliance with the medical regimen: Fair Hypoglycemia: Never    Glucose monitoring:  done 2 times a day         Glucometer:  Accu-Chek .      Blood Glucose readings with averages from download   Mean values apply above for all meters except median for One Touch  PRE-MEAL Fasting Lunch Dinner Bedtime Overall  Glucose range: 65-509      Mean/median: 191       Self-care: The diet that the patient has been following is: tries to limit Sweet drinks .  Meal times are:  Breakfast is at 7 a.m.  Dinner: 5-6 PM    Typical meal intake: Breakfast is usually eggs,  Sausage, oatmeal or grits.  Lunch is a sandwich, any evening he has meat and vegetables, For snacks he will have fruit or crackers                Dietician visit, most recent: 01/09/16 CDE visit: 12/18               Exercise: Unable to do any     Weight history:  Wt Readings from Last 3 Encounters:  01/12/17 236 lb (107 kg)  01/11/17 238 lb (108 kg)  12/16/16 230 lb 12 oz (104.7 kg)    Glycemic control:   Lab Results  Component Value Date   HGBA1C 9.5 01/12/2017   HGBA1C 9.3 08/20/2016   HGBA1C 7.3 (H) 06/04/2016   Lab Results  Component Value Date   MICROALBUR 1.4 07/07/2016   LDLCALC 44 02/19/2016   CREATININE 1.32 (H) 12/04/2016   Lab Results  Component  Value Date   MICRALBCREAT 1.2 07/07/2016    Other active problems: See review of systems   Allergies as of 01/12/2017      Reactions   Morphine And Related Other (See Comments)   Drenched with perspiration   Entresto [sacubitril-valsartan]    hypotension   Demerol [meperidine] Nausea Only   Starlix [nateglinide] Other (See Comments)   gassy      Medication List        Accurate as of 01/12/17 12:38 PM. Always use your most recent med list.          ACCU-CHEK GUIDE test strip Generic drug:  glucose blood USE TO CHECK BLOOD SUGAR 3 TIMES DAILY   acetaminophen 325 MG tablet Commonly known as:  TYLENOL Take 2 tablets (650 mg total) by mouth every 4 (four) hours as needed for headache or mild pain.   atorvastatin 40 MG tablet Commonly known as:  LIPITOR TAKE ONE TABLET BY MOUTH EVERY DAY   bisoprolol 5 MG tablet Commonly known as:  ZEBETA Take 1 tablet (5 mg total) by mouth daily.   digoxin 0.125 MG tablet Commonly known as:  LANOXIN Take 1 tablet (0.125 mg total) by mouth daily.   fluticasone 50 MCG/ACT nasal spray Commonly known as:  FLONASE Place 2 sprays into both nostrils daily.   gabapentin 100 MG capsule Commonly known as:  NEURONTIN Take 200 mg by mouth 3 (three) times daily.   guaiFENesin 600 MG 12 hr tablet Commonly known as:  MUCINEX Take 2 tablets (1,200 mg total) by mouth 2 (two) times daily.   hyoscyamine 0.125 MG SL tablet Commonly known as:  LEVSIN SL 1-2 tabs po q6h prn diarrhea   insulin regular human CONCENTRATED 500 UNIT/ML injection Commonly known as:  HUMULIN R Use 56 units daily in insulin pump   Insulin Syringe-Needle U-100 30G X 1/2" 1 ML Misc Use to fill insulin VGo pump one time daily   isosorbide mononitrate 30 MG 24 hr tablet Commonly known as:  IMDUR TAKE ONE TABLET BY MOUTH EVERY DAY   losartan 25 MG tablet Commonly known as:  COZAAR Take 1 tablet (25 mg total) 2 (two) times daily with a meal by mouth.   mirtazapine 45  MG tablet Commonly known as:  REMERON Take 1 tablet (45 mg total) at bedtime by mouth.   multivitamin with minerals Tabs tablet Take 1 tablet by mouth daily.   nitroGLYCERIN  0.4 MG SL tablet Commonly known as:  NITROSTAT Place 1 tablet (0.4 mg total) under the tongue every 5 (five) minutes as needed for chest pain.   OXYGEN Inhale 2 L into the lungs.   predniSONE 20 MG tablet Commonly known as:  DELTASONE 2 tabs po qd x 5d, then 1 tab po qd x 5d   PROAIR HFA 108 (90 Base) MCG/ACT inhaler Generic drug:  albuterol INHALE 2 PUFFS INTO THE LUNGS EVERY 4 HOURS AS NEEDED FOR WHEEZING ORSHORTNESS OF BREATH   rivaroxaban 20 MG Tabs tablet Commonly known as:  XARELTO Take 1 tablet (20 mg total) by mouth daily with supper.   sertraline 25 MG tablet Commonly known as:  ZOLOFT TAKE TWO TABLETS BY MOUTH EVERY DAY   spironolactone 25 MG tablet Commonly known as:  ALDACTONE Take 1 tablet (25 mg total) by mouth daily.   SYMBICORT 160-4.5 MCG/ACT inhaler Generic drug:  budesonide-formoterol INHALE 2 PUFFS INTO THE LUNGS TWICE DAILY   SYNJARDY 05-998 MG Tabs Generic drug:  Empagliflozin-Metformin HCl TAKE ONE TABLET BY MOUTH TWICE DAILY AFTER A MEAL   tamsulosin 0.4 MG Caps capsule Commonly known as:  FLOMAX TAKE 1 CAPSULE BY MOUTH EVERY DAY AFTER SUPPER   torsemide 20 MG tablet Commonly known as:  DEMADEX Take 40 mg by mouth daily. Takes 2 tablet daily   TOUJEO MAX SOLOSTAR 300 UNIT/ML Sopn Generic drug:  Insulin Glargine Inject 50 Units into the skin 2 (two) times daily.   V-GO 20 Kit Apply one Vgo 20 pump daily.   vitamin C 1000 MG tablet Take 1,000 mg by mouth daily.   Vitamin D-3 1000 units Caps Take 1,000 Units by mouth daily. Reported on 02/26/2015       Allergies:  Allergies  Allergen Reactions  . Morphine And Related Other (See Comments)    Drenched with perspiration  . Entresto [Sacubitril-Valsartan]     hypotension  . Demerol [Meperidine] Nausea Only    . Starlix [Nateglinide] Other (See Comments)    gassy    Past Medical History:  Diagnosis Date  . Acute respiratory distress 06/04/2016  . Adenomatous colon polyp 10/16/2011   Repeat 2018  . Arthritis     Hips, R>L & KNEES  . CAD, multiple vessel    3V CAD cath 08/08/13----CABG done shortly after.  Cath 06/2016: clear bipasses/no interventional lesion.  . 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 pylori neg, no dysplasia or malignancy.  . Chronic combined systolic and diastolic CHF, NYHA class 2 ([place]) 2016-2018   Followed by CHF clinic  . Chronic renal insufficiency, stage 3 (moderate) (HCC) 2018   GFR 50s  . COPD (chronic obstructive pulmonary disease) ([place])    GOLD II.  Spirometry  2004 borderline obstruction; 2015 mod obst: noncompliant with bid symbicort so pulmonologist switched him to a once daily inhaler: Trelegy ellipta 12/2015.  Oxygen prn: goal 88-92%.  . Diabetic nephropathy ([place])    Elevated urine microalb/cr 03/2011  . DM type 2 (diabetes mellitus, type 2) ([place])    Poor control on max oral meds--pt eventually agreed to insulin therapy.  As of 2017 his DM is being managed by Dr. Dwyane Dee in endo.  . DOE (dyspnea on exertion)    COPD + chronic diastolic HF  . Erectile dysfunction    Normal testosterone  . Hearing loss of both ears 2016   Hearing aids  . Hyperlipidemia   . Hyperplastic colon polyp 2001   No further  colonoscopies to be done as of 11/2016 GI eval.  . Hypertension   . IBS (irritable bowel syndrome)    responds well to levsin  . Iron deficiency anemia 2014   03/2012 capsule endoscopy showed 2 AVMs--likely responsible for his IDA--lifetime iron supp recommended + q36moCBCs.  . Ischemic cardiomyopathy 06/2016; 09/2016   06/2016: EF 25-30%, global hypokinesis, grd II DD, vent septum motion changes c/w LBBB, mod MV regurg, L atr mod/sev dilat, R atr mod dilat, mod incr pulm press, Grd II DD.  09/2016--EF 40-45%; diffuse hypokinesis,  Grd I DD.  . Macular degeneration, dry    Mild, bilat (Optometrist, D[place]ford Knifeat [place] [place]  . Obesity   . Open toe wound 11/2015   Wound care clinic appt made and then canceled when toe improved.  . Other and unspecified angina pectoris   . PAD (peripheral artery disease) ([place] 02/2016   Abnormal ABI's and waveforms: LE arterial duplex ordered for f/u as per cardiologist's recommendation.  Vasc eval by Dr. CBridgett Larsson impression was minimal PAD, recommended maximize med mgmt.  .Marland KitchenPAF (paroxysmal atrial fibrillation) ([place] 10/2014   xarelto  . Pericardial effusion with cardiac tamponade 6//29/15   pericardiocentesis was done,  Infectious/inflamm (cytology showed NO MALIGNANT CELLS)  . Pleural plaque    Pleural plaques/asbestosis changes on CT chest done by pulm 05/2016.  .Marland KitchenPneumonia   . Tobacco dependence in remission    100+ pack-yr hx: quit after CABG    Past Surgical History:  Procedure Laterality Date  . APPENDECTOMY  1957  . CARDIAC CATHETERIZATION  08/08/2013  . CATARACT EXTRACTION W/ INTRAOCULAR LENS  IMPLANT, BILATERAL  04/08/2006 & 04/22/2006  . CATARACT EXTRACTION W/ INTRAOCULAR LENS  IMPLANT, BILATERAL Bilateral   . CHOLECYSTECTOMY OPEN  1999  . COLONOSCOPY  10/16/2011   Procedure: COLONOSCOPY;  Surgeon: RInda Castle MD;  Location: [facility];  Service: Endoscopy;  Laterality: N/A;  . CORONARY ARTERY BYPASS GRAFT N/A 08/14/2013   Procedure: CORONARY ARTERY BYPASS GRAFTING (CABG);  Surgeon: SMelrose Nakayama MD;  [facility]  Service: Open Heart Surgery;  Laterality: N/A;  Times 4   using left internal mammary artery and endoscopically harvested bilateral saphenous vein  . ESOPHAGOGASTRODUODENOSCOPY  02/25/12   Atrophic gastritis with a few erosions--capsule endoscopy planned as of 02/25/12 (Dr. KDeatra Ina.  .Marland KitchenHARDWARE REMOVAL Right 08/12/2012   Procedure: HARDWARE REMOVAL;  Surgeon: CMcarthur Rossetti MD;  Location: [facility];  Service:  Orthopedics;  Laterality: Right;  . HIP SURGERY Right 1954   Repair of slipped capital femoral epiphysis.  . INTRAOPERATIVE TRANSESOPHAGEAL ECHOCARDIOGRAM N/A 08/14/2013   Normal LV function. Procedure: INTRAOPERATIVE TRANSESOPHAGEAL ECHOCARDIOGRAM;  Surgeon: SMelrose Nakayama MD;  [place] [facility]  Service: Open Heart Surgery;  Laterality: N/A;  . LEFT HEART CATHETERIZATION WITH CORONARY ANGIOGRAM N/A 08/08/2013   Procedure: LEFT HEART CATHETERIZATION WITH CORONARY ANGIOGRAM;  Surgeon: JJettie Booze MD;  [facility];  Service: Cardiovascular;  Laterality: N/A;  . PATELLA FRACTURE SURGERY Left ~ 1979   bolt + 3 screws to repair tib plateau fx  . PERICARDIAL TAP N/A 07/01/2013   Procedure: PERICARDIAL TAP;  Surgeon: JJettie Booze MD;  [facility];  Service: Cardiovascular;  Laterality: N/A;  . RIGHT/LEFT HEART CATH AND CORONARY ANGIOGRAPHY N/A 06/10/2016   Procedure: Right/Left Heart Cath and Coronary Angiography;  Surgeon: MLarey Dresser MD;  [facility];  Service: Cardiovascular;  Laterality: N/A;  .  TESTICLE SURGERY  as a child   Undescended testicle brought down into scrotum  . TONSILLECTOMY  1947  . TOTAL HIP ARTHROPLASTY Right 08/12/2012   Procedure: REMOVAL OF OLD PINS RIGHT HIP AND RIGHT TOTAL HIP ARTHROPLASTY ANTERIOR APPROACH;  Surgeon: Mcarthur Rossetti, MD;  Location: [facility];  Service: Orthopedics;  Laterality: Right;  . TRANSESOPHAGEAL ECHOCARDIOGRAM  09/22/2016   EF 40-45%; diffuse hypokinesis, Grd I DD.  [name] TRANSTHORACIC ECHOCARDIOGRAM  10/30/14   Mod LVH, EF 60-65%, normal wall motion, mod mitral regurg, mild PAH  . TRANSTHORACIC ECHOCARDIOGRAM  06/2016   06/2016: EF 25-30%, global hypokinesis, grd II DD, vent septum motion changes c/w LBBB, mod MV regurg, L atr mod/sev dilat, R atr mod dilat, mod incr pulm press, Grd II DD.    Family History  Problem Relation Age of Onset  . Heart disease Father   . Heart attack Father    . CVA Mother   . Hypertension Mother   . Diabetes Paternal Grandmother   . Breast cancer Sister   . Diabetes Maternal Uncle   . Heart disease Maternal Uncle   . Stroke Neg Hx     Social History:  reports that he quit smoking about 14 months ago. His smoking use included cigarettes. He has a 15.50 pack-year smoking history. He quit smokeless tobacco use about 3 years ago. He reports that he does not drink alcohol or use drugs.   Review of Systems  Following is a copy of the previous note  Lipid history: He had been prescribed Lipitor 40 mg With good control    Lab Results  Component Value Date   CHOL 119 02/19/2016   HDL 43.30 02/19/2016   LDLCALC 44 02/19/2016   LDLDIRECT 187.0 11/21/2015   TRIG 159.0 (H) 02/19/2016   CHOLHDL 3 02/19/2016           Hypertension:Mild and treated with bisoprolol 5 mg,25 mg losartan  Also on diuretics for CHF Followed by cardiologist   BP Readings from Last 3 Encounters:  01/12/17 (!) 128/58  01/11/17 (!) 132/46  12/16/16 (!) 146/61     Most recent eye exam was 7/17, Dr Marina Gravel? Has had no retinopathy  Most recent foot exam: 12/17   LABS:  Office Visit on 01/12/2017  Component Date Value Ref Range Status  . Hemoglobin A1C 01/12/2017 9.5   Final  Lab on 01/11/2017  Component Date Value Ref Range Status  . Vitamin B-12 01/11/2017 381  200 - 1,100 pg/mL Final   Comment: . Please Note: Although the reference range for vitamin B12 is [phone] pg/mL, it has been reported that between 5 and 10% of patients with values between 200 and 400 pg/mL may experience neuropsychiatric and hematologic abnormalities due to occult B12 deficiency; less than 1% of patients with values above 400 pg/mL will have symptoms. .   . QUESTION/PROBLEM: 01/11/2017    Final   Comment: . No test(s) are indicated on the requisition for the following specimen(s). .   . SPECIMEN(S) RECEIVED: 01/11/2017 KSS   Final   Comment: To prevent further delays in  testing, please contact us immediately at [phone] order to  resolve this questionable order. Fax completed form to [phone].   . TEST NAME: 01/11/2017 COMPREHENSIVE METABOLIC   Final  . TEST CODE: 01/11/2017 [id number]   Final  . CLIENT CONTACT: 01/11/2017 PEGGY STONE   Final  . REPORT ALWAYS MESSAGE SIGNATURE 01/11/2017    Final   Comment: . The laboratory testing  on this patient was verbally requested or confirmed by the ordering physician or his or her authorized representative after contact with an employee of [employer]. Federal regulations require that we maintain on file written authorization for all laboratory testing.  Accordingly we are asking that the ordering physician or his or her authorized representative sign a copy of this report and promptly return it to the client service representative. . . Signature:____________________________________________________ . Please fax this signed page to [phone] or return it via your [employer] courier.     Physical Examination:  BP (!) 128/58   Pulse 91   Ht 5' 11" (1.803 m)   Wt 236 lb (107 kg)   SpO2 93%   BMI 32.92 kg/m       ASSESSMENT:  Diabetes type 2, uncontrolled    See history of present illness for detailed discussion of current diabetes management, blood sugar patterns and problems identified  A1c was over 9% previously and is not improved This is despite initial improvement in his blood sugar patterns with the V-go pump in the U-500 insulin His fasting blood sugars are being consistent and recently doing better and most likely his control is affected by his diet; last month had several significantly high readings even fasting No postprandial readings available for review Also postprandial readings are likely to be high partly from taking his boluses after eating He thinks he is generally compliant with changing the pump at the same time every evening    PLAN:    He will stay on the 20 unit basal on the V-go pump using the U-500 insulin However if he is not able to afford the insulin he will need to go back to multiple injections Most likely he will not be able to learn how to use the Omnipod insulin pump Discussed in detail the need to check readings after meals to help control postprandial readings and that his A1c is high because of postprandial hyperglycemia Also discussed the duration and onset of action of the U-500 insulin and needs to take insulin bolus 30 minutes or so before every meal If his blood sugars are low normal at bedtime he may be able to cut back one click on his bolus Trial of Synjardy at breakfast and lunch instead of breakfast and suppertime Needs short-term follow-up make sure he is following instructions and blood sugars are better controlled   Patient Instructions  MUST CLICK ON PUMP 30 MIN BEFORE MEALS  SYNJARDY 1 AT BFST 1 at lunch  More sugars at lunch, dinner, bedtime  Low fat dairy and cooking   Counseling time on subjects discussed in assessment and plan sections is over 50% of today's 25 minute visit   Elayne Snare 01/12/2017, 12:38 PM   Note: This office note was prepared with Dragon voice recognition system technology. Any transcriptional errors that result from this process are unintentional.    Elayne Snare

## 2017-01-12 NOTE — Patient Instructions (Addendum)
MUST CLICK ON PUMP 30 MIN BEFORE MEALS  SYNJARDY 1 AT BFST 1 at lunch  More sugars at lunch, dinner, bedtime  Low fat dairy and cooking

## 2017-01-13 ENCOUNTER — Telehealth: Payer: Self-pay

## 2017-01-13 ENCOUNTER — Encounter: Payer: Self-pay | Admitting: Family Medicine

## 2017-01-13 NOTE — Telephone Encounter (Signed)
-----  Message from Cameron Sprang, MD sent at 01/12/2017 12:32 PM EST ----- Pls let him know B12 level is normal, thanks

## 2017-01-13 NOTE — Telephone Encounter (Signed)
LMOM relaying message below.

## 2017-01-14 ENCOUNTER — Encounter: Payer: Self-pay | Admitting: Podiatry

## 2017-01-14 ENCOUNTER — Ambulatory Visit: Payer: Medicare Other | Admitting: Podiatry

## 2017-01-14 ENCOUNTER — Ambulatory Visit: Payer: Medicare Other | Admitting: Orthotics

## 2017-01-14 DIAGNOSIS — M2041 Other hammer toe(s) (acquired), right foot: Secondary | ICD-10-CM

## 2017-01-14 DIAGNOSIS — M2040 Other hammer toe(s) (acquired), unspecified foot: Secondary | ICD-10-CM

## 2017-01-14 DIAGNOSIS — J449 Chronic obstructive pulmonary disease, unspecified: Secondary | ICD-10-CM | POA: Diagnosis not present

## 2017-01-14 DIAGNOSIS — M2042 Other hammer toe(s) (acquired), left foot: Secondary | ICD-10-CM

## 2017-01-14 DIAGNOSIS — E119 Type 2 diabetes mellitus without complications: Secondary | ICD-10-CM

## 2017-01-14 DIAGNOSIS — E1151 Type 2 diabetes mellitus with diabetic peripheral angiopathy without gangrene: Secondary | ICD-10-CM

## 2017-01-14 DIAGNOSIS — I5032 Chronic diastolic (congestive) heart failure: Secondary | ICD-10-CM | POA: Diagnosis not present

## 2017-01-14 DIAGNOSIS — Z0189 Encounter for other specified special examinations: Secondary | ICD-10-CM

## 2017-01-14 DIAGNOSIS — J9611 Chronic respiratory failure with hypoxia: Secondary | ICD-10-CM | POA: Diagnosis not present

## 2017-01-14 NOTE — Progress Notes (Signed)
Patient presents today for diabetic shoe measurement and foam casting.  Goals of diabetic shoes/inserts to offer protection from conditions secondary to DM2, offer relief from sheer forces that could lead to ulcerations, protect the foot, and offer greater stability. Patient is under supervision of DPM Price Physician managing patients DM2: Joshua [name] Patient has following documented conditions to qualify for diabetic shoes/inserts:  HAV, DM2, PN, HT Patient measured with brannock device: 11.5 x

## 2017-01-14 NOTE — Progress Notes (Signed)
Subjective:    Patient ID: Joshua [name], male    DOB: 04-24-1940, 77 y.o.   MRN: [id number]  HPI  Chief Complaint  Patient presents with  . Diabetes    Last A1C - 9.5 ; wants diabetic shoes        Review of Systems  All other systems reviewed and are negative.      Objective:   Physical Exam        Assessment & Plan:

## 2017-01-15 ENCOUNTER — Encounter: Payer: Self-pay | Admitting: Family Medicine

## 2017-01-15 ENCOUNTER — Ambulatory Visit (INDEPENDENT_AMBULATORY_CARE_PROVIDER_SITE_OTHER): Payer: Medicare Other | Admitting: Family Medicine

## 2017-01-15 VITALS — BP 135/53 | HR 72 | Temp 98.2°F | Resp 20 | Ht 71.0 in | Wt 239.5 lb

## 2017-01-15 DIAGNOSIS — H7291 Unspecified perforation of tympanic membrane, right ear: Secondary | ICD-10-CM

## 2017-01-15 DIAGNOSIS — J069 Acute upper respiratory infection, unspecified: Secondary | ICD-10-CM | POA: Diagnosis not present

## 2017-01-15 DIAGNOSIS — H65111 Acute and subacute allergic otitis media (mucoid) (sanguinous) (serous), right ear: Secondary | ICD-10-CM

## 2017-01-15 DIAGNOSIS — J441 Chronic obstructive pulmonary disease with (acute) exacerbation: Secondary | ICD-10-CM | POA: Diagnosis not present

## 2017-01-15 MED ORDER — PREDNISONE 20 MG PO TABS: ORAL_TABLET | ORAL | 0 refills | Status: DC

## 2017-01-15 MED ORDER — OFLOXACIN 0.3 % OT SOLN: 10.0000 [drp] | Freq: Every day | OTIC | 0 refills | Status: DC

## 2017-01-15 MED ORDER — CEFDINIR 300 MG PO CAPS: 300.0000 mg | ORAL_CAPSULE | Freq: Two times a day (BID) | ORAL | 0 refills | Status: DC

## 2017-01-15 NOTE — Progress Notes (Signed)
OFFICE VISIT  01/15/2017   CC:  Chief Complaint  Patient presents with  . URI   HPI:    Patient is a 77 y.o. Caucasian male with a history of COPD and chronic combined systolic and diastolic HF who presents accompanied by his wife for respiratory symptoms. Onset 2-3 days ago, ear pressure, runny/congested nose, cough.  Some PND.  No ST.  NO signif HA. No fever.  No SOB different than his usual.  Some wheezing per wife. Fatigued, but no achiness.  No rash.  Eating and drinking fine.    Past Medical History:  Diagnosis Date  . Acute respiratory distress 06/04/2016  . Adenomatous colon polyp 10/16/2011   Repeat 2018  . Arthritis     Hips, R>L & KNEES  . CAD, multiple vessel    3V CAD cath 08/08/13----CABG done shortly after.  Cath 06/2016: clear bipasses/no interventional lesion.  . 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 pylori neg, no dysplasia or malignancy.  . Chronic combined systolic and diastolic CHF, NYHA class 2 ([place]) 2016-2018   Followed by CHF clinic  . Chronic renal insufficiency, stage 3 (moderate) (HCC) 2018   GFR 50s  . COPD (chronic obstructive pulmonary disease) ([place])    GOLD II.  Spirometry  2004 borderline obstruction; 2015 mod obst: noncompliant with bid symbicort so pulmonologist switched him to a once daily inhaler: Trelegy ellipta 12/2015.  Oxygen prn: goal 88-92%.  . Diabetic nephropathy ([place])    Elevated urine microalb/cr 03/2011  . DM type 2 (diabetes mellitus, type 2) ([place])    Poor control on max oral meds--pt eventually agreed to insulin therapy.  As of 2017 his DM is being managed by Dr. Dwyane Dee in endo.  . DOE (dyspnea on exertion)    COPD + chronic diastolic HF  . Dysthymia    Worsened after 2015 heart surgery.  Memory loss +? as well.  Neuro eval 01/2017--plan is to get MRI brain, B12 level, and neuropsychiatric testing.  . Erectile dysfunction    Normal testosterone  . Hearing loss of both ears 2016   Hearing aids   . Hyperlipidemia   . Hyperplastic colon polyp 2001   No further colonoscopies to be done as of 11/2016 GI eval.  . Hypertension   . IBS (irritable bowel syndrome)    responds well to levsin  . Iron deficiency anemia 2014   03/2012 capsule endoscopy showed 2 AVMs--likely responsible for his IDA--lifetime iron supp recommended + q8moCBCs.  . Ischemic cardiomyopathy 06/2016; 09/2016   06/2016: EF 25-30%, global hypokinesis, grd II DD, vent septum motion changes c/w LBBB, mod MV regurg, L atr mod/sev dilat, R atr mod dilat, mod incr pulm press, Grd II DD.  09/2016--EF 40-45%; diffuse hypokinesis, Grd I DD.  . Macular degeneration, dry    Mild, bilat (Optometrist, [place]ford Knifeat [place]  . Memory impairment   . Obesity   . Open toe wound 11/2015   Wound care clinic appt made and then canceled when toe improved.  . Other and unspecified angina pectoris   . PAD (peripheral artery disease) [place] 02/2016   Abnormal ABI's and waveforms: LE arterial duplex ordered for f/u as per cardiologist's recommendation.  Vasc eval by Dr. CBridgett Larsson impression was minimal PAD, recommended maximize med mgmt.  .Marland KitchenPAF (paroxysmal atrial fibrillation) ([place] 10/2014   xarelto  . Pericardial effusion with cardiac tamponade 6//29/15   pericardiocentesis was done,  Infectious/inflamm (cytology  showed NO MALIGNANT CELLS)  . Pleural plaque    Pleural plaques/asbestosis changes on CT chest done by pulm 05/2016.  [name] Pneumonia   . Tobacco dependence in remission    100+ pack-yr hx: quit after CABG    Past Surgical History:  Procedure Laterality Date  . APPENDECTOMY  1957  . CARDIAC CATHETERIZATION  08/08/2013  . CATARACT EXTRACTION W/ INTRAOCULAR LENS  IMPLANT, BILATERAL  04/08/2006 & 04/22/2006  . CATARACT EXTRACTION W/ INTRAOCULAR LENS  IMPLANT, BILATERAL Bilateral   . CHOLECYSTECTOMY OPEN  1999  . COLONOSCOPY  10/16/2011   Procedure: COLONOSCOPY;  Surgeon: Inda Castle, MD;  Location: [facility]  ENDOSCOPY;  Service: Endoscopy;  Laterality: N/A;  . CORONARY ARTERY BYPASS GRAFT N/A 08/14/2013   Procedure: CORONARY ARTERY BYPASS GRAFTING (CABG);  Surgeon: Melrose Nakayama, MD;  Location: [facility];  Service: Open Heart Surgery;  Laterality: N/A;  Times 4   using left internal mammary artery and endoscopically harvested bilateral saphenous vein  . ESOPHAGOGASTRODUODENOSCOPY  02/25/12   Atrophic gastritis with a few erosions--capsule endoscopy planned as of 02/25/12 (Dr. Deatra Ina).  [name] HARDWARE REMOVAL Right 08/12/2012   Procedure: HARDWARE REMOVAL;  Surgeon: Mcarthur Rossetti, MD;  Location: [facility];  Service: Orthopedics;  Laterality: Right;  . HIP SURGERY Right 1954   Repair of slipped capital femoral epiphysis.  . INTRAOPERATIVE TRANSESOPHAGEAL ECHOCARDIOGRAM N/A 08/14/2013   Normal LV function. Procedure: INTRAOPERATIVE TRANSESOPHAGEAL ECHOCARDIOGRAM;  Surgeon: Melrose Nakayama, MD;  Location: [facility];  Service: Open Heart Surgery;  Laterality: N/A;  . LEFT HEART CATHETERIZATION WITH CORONARY ANGIOGRAM N/A 08/08/2013   Procedure: LEFT HEART CATHETERIZATION WITH CORONARY ANGIOGRAM;  Surgeon: Jettie Booze, MD;  Location: [facility] CATH LAB;  Service: Cardiovascular;  Laterality: N/A;  . PATELLA FRACTURE SURGERY Left ~ 1979   bolt + 3 screws to repair tib plateau fx  . PERICARDIAL TAP N/A 07/01/2013   Procedure: PERICARDIAL TAP;  Surgeon: Jettie Booze, MD;  Location: [facility] CATH LAB;  Service: Cardiovascular;  Laterality: N/A;  . RIGHT/LEFT HEART CATH AND CORONARY ANGIOGRAPHY N/A 06/10/2016   Procedure: Right/Left Heart Cath and Coronary Angiography;  Surgeon: Larey Dresser, MD;  Location: [facility] CV LAB;  Service: Cardiovascular;  Laterality: N/A;  . TESTICLE SURGERY  as a child   Undescended testicle brought down into scrotum  . TONSILLECTOMY  1947  . TOTAL HIP ARTHROPLASTY Right 08/12/2012   Procedure: REMOVAL OF OLD PINS RIGHT HIP AND RIGHT TOTAL HIP ARTHROPLASTY ANTERIOR APPROACH;   Surgeon: Mcarthur Rossetti, MD;  Location: [facility];  Service: Orthopedics;  Laterality: Right;  . TRANSESOPHAGEAL ECHOCARDIOGRAM  09/22/2016   EF 40-45%; diffuse hypokinesis, Grd I DD.  [name] TRANSTHORACIC ECHOCARDIOGRAM  10/30/14   Mod LVH, EF 60-65%, normal wall motion, mod mitral regurg, mild PAH  . TRANSTHORACIC ECHOCARDIOGRAM  06/2016   06/2016: EF 25-30%, global hypokinesis, grd II DD, vent septum motion changes c/w LBBB, mod MV regurg, L atr mod/sev dilat, R atr mod dilat, mod incr pulm press, Grd II DD.    Outpatient Medications Prior to Visit  Medication Sig Dispense Refill  . ACCU-CHEK GUIDE test strip USE TO CHECK BLOOD SUGAR 3 TIMES DAILY 100 each 11  . acetaminophen (TYLENOL) 325 MG tablet Take 2 tablets (650 mg total) by mouth every 4 (four) hours as needed for headache or mild pain.    . Ascorbic Acid (VITAMIN C) 1000 MG tablet Take 1,000 mg by mouth daily.    [name] atorvastatin (LIPITOR) 40  MG tablet TAKE ONE TABLET BY MOUTH EVERY DAY 90 tablet 1  . bisoprolol (ZEBETA) 5 MG tablet Take 1 tablet (5 mg total) by mouth daily. 30 tablet 6  . Cholecalciferol (VITAMIN D-3) 1000 units CAPS Take 1,000 Units by mouth daily. Reported on 02/26/2015    . digoxin (LANOXIN) 0.125 MG tablet Take 1 tablet (0.125 mg total) by mouth daily. 30 tablet 6  . fluticasone (FLONASE) 50 MCG/ACT nasal spray Place 2 sprays into both nostrils daily. 16 g 1  . gabapentin (NEURONTIN) 100 MG capsule Take 200 mg by mouth 3 (three) times daily.    [name] guaiFENesin (MUCINEX) 600 MG 12 hr tablet Take 2 tablets (1,200 mg total) by mouth 2 (two) times daily. 30 tablet 0  . hyoscyamine (LEVSIN SL) 0.125 MG SL tablet 1-2 tabs po q6h prn diarrhea 60 tablet 3  . Insulin Disposable Pump (V-GO 20) KIT Apply one Vgo 20 pump daily. 30 kit 3  . Insulin Glargine (TOUJEO MAX SOLOSTAR) 300 UNIT/ML SOPN Inject 50 Units into the skin 2 (two) times daily.    . insulin regular human CONCENTRATED (HUMULIN R) 500 UNIT/ML injection Use 56  units daily in insulin pump 20 mL 3  . Insulin Syringe-Needle U-100 30G X 1/2" 1 ML MISC Use to fill insulin VGo pump one time daily 100 each 2  . isosorbide mononitrate (IMDUR) 30 MG 24 hr tablet TAKE ONE TABLET BY MOUTH EVERY DAY 90 tablet 1  . losartan (COZAAR) 25 MG tablet Take 1 tablet (25 mg total) 2 (two) times daily with a meal by mouth. 60 tablet 3  . mirtazapine (REMERON) 45 MG tablet Take 1 tablet (45 mg total) at bedtime by mouth. 30 tablet 1  . Multiple Vitamin (MULTIVITAMIN WITH MINERALS) TABS Take 1 tablet by mouth daily.    . nitroGLYCERIN (NITROSTAT) 0.4 MG SL tablet Place 1 tablet (0.4 mg total) under the tongue every 5 (five) minutes as needed for chest pain. 25 tablet 3  . OXYGEN Inhale 2 L into the lungs.    [name] PROAIR HFA 108 (90 Base) MCG/ACT inhaler INHALE 2 PUFFS INTO THE LUNGS EVERY 4 HOURS AS NEEDED FOR WHEEZING ORSHORTNESS OF BREATH 8.5 g 1  . rivaroxaban (XARELTO) 20 MG TABS tablet Take 1 tablet (20 mg total) by mouth daily with supper. 90 tablet 3  . sertraline (ZOLOFT) 25 MG tablet TAKE TWO TABLETS BY MOUTH EVERY DAY 60 tablet 3  . spironolactone (ALDACTONE) 25 MG tablet Take 1 tablet (25 mg total) by mouth daily. 30 tablet 6  . SYMBICORT 160-4.5 MCG/ACT inhaler INHALE 2 PUFFS INTO THE LUNGS TWICE DAILY 10.2 g 5  . SYNJARDY 05-998 MG TABS TAKE ONE TABLET BY MOUTH TWICE DAILY AFTER A MEAL 60 tablet 2  . tamsulosin (FLOMAX) 0.4 MG CAPS capsule TAKE 1 CAPSULE BY MOUTH EVERY DAY AFTER SUPPER 90 capsule 1  . torsemide (DEMADEX) 20 MG tablet Take 40 mg by mouth daily. Takes 2 tablet daily    . predniSONE (DELTASONE) 20 MG tablet 2 tabs po qd x 5d, then 1 tab po qd x 5d 15 tablet 0   No facility-administered medications prior to visit.     Allergies  Allergen Reactions  . Morphine And Related Other (See Comments)    Drenched with perspiration  . Entresto [Sacubitril-Valsartan]     hypotension  . Demerol [Meperidine] Nausea Only  . Starlix [Nateglinide] Other (See  Comments)    gassy    ROS As per HPI  PE: Blood pressure (!) 135/53, pulse 72, temperature 98.2 F (36.8 C), temperature source Oral, resp. rate 20, height _0  (1.803 m), weight 239 lb 8 oz (108.6 kg), SpO2 95 %. VS: noted--normal. Gen: alert, NAD, NONTOXIC APPEARING. HEENT: eyes without injection, drainage, or swelling.  Ears: R EAC with light green colored drainage, TM erythematous/mottled, +pus in middle ear, small perforation noted.  Left EAC normal, TM normal.  Nose: Clear rhinorrhea, with some dried, crusty exudate adherent to mildly injected mucosa.  No purulent d/c.  No paranasal sinus TTP.  No facial swelling.  Throat and mouth without focal lesion.  No pharyngial swelling, erythema, or exudate.   Neck: supple, no LAD.   LUNGS: CTA bilat, nonlabored resps.   CV: RRR, no m/r/g. EXT: no c/c/e SKIN: no rash  LABS:    Chemistry      Component Value Date/Time   NA 145 01/11/2017 1018   K 4.4 01/11/2017 1018   CL 103 01/11/2017 1018   CO2 22 01/11/2017 1018   BUN 21 01/11/2017 1018   CREATININE 1.22 (H) 01/11/2017 1018      Component Value Date/Time   CALCIUM 9.2 01/11/2017 1018   ALKPHOS 81 08/27/2016 1630   AST 21 01/11/2017 1018   ALT 21 01/11/2017 1018   BILITOT 0.3 01/11/2017 1018     Lab Results  Component Value Date   WBC 7.5 08/28/2016   HGB 12.8 (L) 08/28/2016   HCT 43.1 08/28/2016   MCV 84.2 08/28/2016   PLT 175 08/28/2016    IMPRESSION AND PLAN:  URI--most likely viral. He has acute mucoid OM on right, with perf'd TM. Very early COPD exacerbation in VERY HIGH RISK patient. Plan: cefdinir 300 mg bid x 7d. Floxin otic 10 gtts in R ear once daily x 7d. Prednisone 40m qd x 5d. Get otc generic robitussin DM OR Mucinex DM and use as directed on the packaging for cough and congestion. Use otc generic saline nasal spray 2-3 times per day to irrigate/moisturize your nasal passages.  An After Visit Summary was printed and given to the  patient.  FOLLOW UP: Return in about 2 weeks (around 01/29/2017) for recheck R ear.  Signed:  PCrissie Sickles MD           01/15/2017

## 2017-01-18 ENCOUNTER — Telehealth: Payer: Self-pay | Admitting: Endocrinology

## 2017-01-18 NOTE — Telephone Encounter (Signed)
[name] (wife) wants to know if they will be getting a call back today. They left an message earlier today because they are having trouble getting patient's medication

## 2017-01-18 NOTE — Telephone Encounter (Signed)
Pts wife stated she is having a problem with the pts medication and needs to speak with someone about it.    Please advise

## 2017-01-19 NOTE — Telephone Encounter (Signed)
Please assist the patient with calling the pharmacy to see what they mean by not being able to refill the script because it is too early

## 2017-01-22 ENCOUNTER — Other Ambulatory Visit (HOSPITAL_COMMUNITY): Payer: Self-pay | Admitting: Student

## 2017-01-26 ENCOUNTER — Ambulatory Visit
Admission: RE | Admit: 2017-01-26 | Discharge: 2017-01-26 | Disposition: A | Discharge status: Home / Self Care | Payer: Medicare Other | Source: Ambulatory Visit | Attending: Neurology | Admitting: Neurology

## 2017-01-26 DIAGNOSIS — R410 Disorientation, unspecified: Secondary | ICD-10-CM | POA: Diagnosis not present

## 2017-01-26 DIAGNOSIS — G3184 Mild cognitive impairment, so stated: Secondary | ICD-10-CM

## 2017-01-27 ENCOUNTER — Other Ambulatory Visit (HOSPITAL_COMMUNITY): Payer: Self-pay | Admitting: Student

## 2017-01-29 ENCOUNTER — Ambulatory Visit (INDEPENDENT_AMBULATORY_CARE_PROVIDER_SITE_OTHER): Payer: Medicare Other | Admitting: Family Medicine

## 2017-01-29 ENCOUNTER — Encounter: Payer: Self-pay | Admitting: Family Medicine

## 2017-01-29 VITALS — BP 124/67 | HR 66 | Temp 98.5°F | Wt 236.0 lb

## 2017-01-29 DIAGNOSIS — H7291 Unspecified perforation of tympanic membrane, right ear: Secondary | ICD-10-CM

## 2017-01-29 DIAGNOSIS — Z23 Encounter for immunization: Secondary | ICD-10-CM | POA: Diagnosis not present

## 2017-01-29 DIAGNOSIS — J441 Chronic obstructive pulmonary disease with (acute) exacerbation: Secondary | ICD-10-CM

## 2017-01-29 DIAGNOSIS — H6691 Otitis media, unspecified, right ear: Secondary | ICD-10-CM

## 2017-01-29 MED ORDER — ZOSTER VAC RECOMB ADJUVANTED 50 MCG/0.5ML IM SUSR: 0.5000 mL | Freq: Once | INTRAMUSCULAR | 1 refills | Status: DC

## 2017-01-29 MED ORDER — ZOSTER VAC RECOMB ADJUVANTED 50 MCG/0.5ML IM SUSR
0.5000 mL | Freq: Once | INTRAMUSCULAR | 1 refills | Status: AC
Start: 2017-01-29 — End: 2017-01-29

## 2017-01-29 NOTE — Progress Notes (Signed)
OFFICE VISIT  01/29/2017   CC:  Chief Complaint  Patient presents with  . Follow-up    Right ear   HPI:    Patient is a 77 y.o. Caucasian male who presents for 2 week f/u AOM on right with ruptured TM, also had COPD exacerbation at last visit. I rx'd cefdinir x 7d, floxin otic, and a 5 day course of prednisone.  R ear feels "ok".  No pain, just sensation of some fluid in it.  No drainage for the last week or so.  Says from resp standpoint all is back to baseline since taking the 5 d course of prednisone. No fevers.  Feeling a bit fatigued this week--describes it as a little "blah"--nothing specific.   Past Medical History:  Diagnosis Date  . Acute respiratory distress 06/04/2016  . Adenomatous colon polyp 10/16/2011   Repeat 2018  . Arthritis     Hips, R>L & KNEES  . CAD, multiple vessel    3V CAD cath 08/08/13----CABG done shortly after.  Cath 06/2016: clear bipasses/no interventional lesion.  . 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 pylori neg, no dysplasia or malignancy.  . Chronic combined systolic and diastolic CHF, NYHA class 2 ([place]) 2016-2018   Followed by CHF clinic  . Chronic renal insufficiency, stage 3 (moderate) (HCC) 2018   GFR 50s  . COPD (chronic obstructive pulmonary disease) ([place])    GOLD II.  Spirometry  2004 borderline obstruction; 2015 mod obst: noncompliant with bid symbicort so pulmonologist switched him to a once daily inhaler: Trelegy ellipta 12/2015.  Oxygen prn: goal 88-92%.  . Diabetic nephropathy ([place])    Elevated urine microalb/cr 03/2011  . DM type 2 (diabetes mellitus, type 2) ([place])    Poor control on max oral meds--pt eventually agreed to insulin therapy.  As of 2017 his DM is being managed by Dr. Dwyane Dee in endo.  . DOE (dyspnea on exertion)    COPD + chronic diastolic HF  . Dysthymia    Worsened after 2015 heart surgery.  Memory loss +? as well.  Neuro eval 01/2017--plan is to get MRI brain, B12 level, and  neuropsychiatric testing.  . Erectile dysfunction    Normal testosterone  . Hearing loss of both ears 2016   Hearing aids  . Hyperlipidemia   . Hyperplastic colon polyp 2001   No further colonoscopies to be done as of 11/2016 GI eval.  . Hypertension   . IBS (irritable bowel syndrome)    responds well to levsin  . Iron deficiency anemia 2014   03/2012 capsule endoscopy showed 2 AVMs--likely responsible for his IDA--lifetime iron supp recommended + q23moCBCs.  . Ischemic cardiomyopathy 06/2016; 09/2016   06/2016: EF 25-30%, global hypokinesis, grd II DD, vent septum motion changes c/w LBBB, mod MV regurg, L atr mod/sev dilat, R atr mod dilat, mod incr pulm press, Grd II DD.  09/2016--EF 40-45%; diffuse hypokinesis, Grd I DD.  . Macular degeneration, dry    Mild, bilat (Optometrist, [place]ife[place] [place]  . Memory impairment   . Obesity   . Open toe wound 11/2015   Wound care clinic appt made and then canceled when toe improved.  . Other and unspecified angina pectoris   . PAD (peripheral artery disease) ([place] 02/2016   Abnormal ABI's and waveforms: LE arterial duplex ordered for f/u as per cardiologist's recommendation.  Vasc eval by Dr. CBridgett Larsson impression was minimal PAD, recommended maximize med  mgmt.  . PAF (paroxysmal atrial fibrillation) ([place]) 10/2014   xarelto  . Pericardial effusion with cardiac tamponade 6//29/15   pericardiocentesis was done,  Infectious/inflamm (cytology showed NO MALIGNANT CELLS)  . Pleural plaque    Pleural plaques/asbestosis changes on CT chest done by pulm 05/2016.  [name] Pneumonia   . Tobacco dependence in remission    100+ pack-yr hx: quit after CABG    Past Surgical History:  Procedure Laterality Date  . APPENDECTOMY  1957  . CARDIAC CATHETERIZATION  08/08/2013  . CATARACT EXTRACTION W/ INTRAOCULAR LENS  IMPLANT, BILATERAL  04/08/2006 & 04/22/2006  . CATARACT EXTRACTION W/ INTRAOCULAR LENS  IMPLANT, BILATERAL Bilateral   .  CHOLECYSTECTOMY OPEN  1999  . COLONOSCOPY  10/16/2011   Procedure: COLONOSCOPY;  Surgeon: Inda Castle, MD;  Location: [facility];  Service: Endoscopy;  Laterality: N/A;  . CORONARY ARTERY BYPASS GRAFT N/A 08/14/2013   Procedure: CORONARY ARTERY BYPASS GRAFTING (CABG);  Surgeon: Melrose Nakayama, MD;  Location: [facility];  Service: Open Heart Surgery;  Laterality: N/A;  Times 4   using left internal mammary artery and endoscopically harvested bilateral saphenous vein  . ESOPHAGOGASTRODUODENOSCOPY  02/25/12   Atrophic gastritis with a few erosions--capsule endoscopy planned as of 02/25/12 (Dr. Deatra Ina).  [name] HARDWARE REMOVAL Right 08/12/2012   Procedure: HARDWARE REMOVAL;  Surgeon: Mcarthur Rossetti, MD;  Location: [facility];  Service: Orthopedics;  Laterality: Right;  . HIP SURGERY Right 1954   Repair of slipped capital femoral epiphysis.  . INTRAOPERATIVE TRANSESOPHAGEAL ECHOCARDIOGRAM N/A 08/14/2013   Normal LV function. Procedure: INTRAOPERATIVE TRANSESOPHAGEAL ECHOCARDIOGRAM;  Surgeon: Melrose Nakayama, MD;  Location: [facility];  Service: Open Heart Surgery;  Laterality: N/A;  . LEFT HEART CATHETERIZATION WITH CORONARY ANGIOGRAM N/A 08/08/2013   Procedure: LEFT HEART CATHETERIZATION WITH CORONARY ANGIOGRAM;  Surgeon: Jettie Booze, MD;  Location[facility] CATH LAB;  Service: Cardiovascular;  Laterality: N/A;  . PATELLA FRACTURE SURGERY Left ~ 1979   bolt + 3 screws to repair tib plateau fx  . PERICARDIAL TAP N/A 07/01/2013   Procedure: PERICARDIAL TAP;  Surgeon: Jettie Booze, MD;  Location[facility] CATH LAB;  Service: Cardiovascular;  Laterality: N/A;  . RIGHT/LEFT HEART CATH AND CORONARY ANGIOGRAPHY N/A 06/10/2016   Procedure: Right/Left Heart Cath and Coronary Angiography;  Surgeon: Larey Dresser, MD;  Location: [facility] CV LAB;  Service: Cardiovascular;  Laterality: N/A;  . TESTICLE SURGERY  as a child   Undescended testicle brought down into scrotum  . TONSILLECTOMY  1947  . TOTAL  HIP ARTHROPLASTY Right 08/12/2012   Procedure: REMOVAL OF OLD PINS RIGHT HIP AND RIGHT TOTAL HIP ARTHROPLASTY ANTERIOR APPROACH;  Surgeon: Mcarthur Rossetti, MD;  Location: [facility];  Service: Orthopedics;  Laterality: Right;  . TRANSESOPHAGEAL ECHOCARDIOGRAM  09/22/2016   EF 40-45%; diffuse hypokinesis, Grd I DD.  [name] TRANSTHORACIC ECHOCARDIOGRAM  10/30/14   Mod LVH, EF 60-65%, normal wall motion, mod mitral regurg, mild PAH  . TRANSTHORACIC ECHOCARDIOGRAM  06/2016   06/2016: EF 25-30%, global hypokinesis, grd II DD, vent septum motion changes c/w LBBB, mod MV regurg, L atr mod/sev dilat, R atr mod dilat, mod incr pulm press, Grd II DD.    Outpatient Medications Prior to Visit  Medication Sig Dispense Refill  . ACCU-CHEK GUIDE test strip USE TO CHECK BLOOD SUGAR 3 TIMES DAILY 100 each 11  . acetaminophen (TYLENOL) 325 MG tablet Take 2 tablets (650 mg total) by mouth every 4 (four) hours as needed for  headache or mild pain.    . Ascorbic Acid (VITAMIN C) 1000 MG tablet Take 1,000 mg by mouth daily.    [name] atorvastatin (LIPITOR) 40 MG tablet TAKE ONE TABLET BY MOUTH EVERY DAY 90 tablet 1  . bisoprolol (ZEBETA) 5 MG tablet TAKE ONE TABLET BY MOUTH EVERY DAY 30 tablet 6  . Cholecalciferol (VITAMIN D-3) 1000 units CAPS Take 1,000 Units by mouth daily. Reported on 02/26/2015    . DIGOX 125 MCG tablet TAKE ONE TABLET BY MOUTH EVERY DAY 30 tablet 6  . fluticasone (FLONASE) 50 MCG/ACT nasal spray Place 2 sprays into both nostrils daily. 16 g 1  . gabapentin (NEURONTIN) 100 MG capsule Take 200 mg by mouth 3 (three) times daily.    [name] guaiFENesin (MUCINEX) 600 MG 12 hr tablet Take 2 tablets (1,200 mg total) by mouth 2 (two) times daily. 30 tablet 0  . hyoscyamine (LEVSIN SL) 0.125 MG SL tablet 1-2 tabs po q6h prn diarrhea 60 tablet 3  . Insulin Disposable Pump (V-GO 20) KIT Apply one Vgo 20 pump daily. 30 kit 3  . insulin regular human CONCENTRATED (HUMULIN R) 500 UNIT/ML injection Use 56 units daily in  insulin pump 20 mL 3  . Insulin Syringe-Needle U-100 30G X 1/2" 1 ML MISC Use to fill insulin VGo pump one time daily 100 each 2  . isosorbide mononitrate (IMDUR) 30 MG 24 hr tablet TAKE ONE TABLET BY MOUTH EVERY DAY 90 tablet 1  . losartan (COZAAR) 25 MG tablet Take 1 tablet (25 mg total) 2 (two) times daily with a meal by mouth. 60 tablet 3  . mirtazapine (REMERON) 45 MG tablet Take 1 tablet (45 mg total) at bedtime by mouth. 30 tablet 1  . Multiple Vitamin (MULTIVITAMIN WITH MINERALS) TABS Take 1 tablet by mouth daily.    . nitroGLYCERIN (NITROSTAT) 0.4 MG SL tablet Place 1 tablet (0.4 mg total) under the tongue every 5 (five) minutes as needed for chest pain. 25 tablet 3  . OXYGEN Inhale 2 L into the lungs.    [name] PROAIR HFA 108 (90 Base) MCG/ACT inhaler INHALE 2 PUFFS INTO THE LUNGS EVERY 4 HOURS AS NEEDED FOR WHEEZING ORSHORTNESS OF BREATH 8.5 g 1  . rivaroxaban (XARELTO) 20 MG TABS tablet Take 1 tablet (20 mg total) by mouth daily with supper. 90 tablet 3  . sertraline (ZOLOFT) 25 MG tablet TAKE TWO TABLETS BY MOUTH EVERY DAY 60 tablet 3  . spironolactone (ALDACTONE) 25 MG tablet TAKE ONE TABLET BY MOUTH EVERY DAY 30 tablet 6  . SYMBICORT 160-4.5 MCG/ACT inhaler INHALE 2 PUFFS INTO THE LUNGS TWICE DAILY 10.2 g 5  . SYNJARDY 05-998 MG TABS TAKE ONE TABLET BY MOUTH TWICE DAILY AFTER A MEAL 60 tablet 2  . tamsulosin (FLOMAX) 0.4 MG CAPS capsule TAKE 1 CAPSULE BY MOUTH EVERY DAY AFTER SUPPER 90 capsule 1  . torsemide (DEMADEX) 20 MG tablet Take 40 mg by mouth daily. Takes 2 tablet daily    . cefdinir (OMNICEF) 300 MG capsule Take 1 capsule (300 mg total) by mouth 2 (two) times daily. (Patient not taking: Reported on 01/29/2017) 14 capsule 0  . Insulin Glargine (TOUJEO MAX SOLOSTAR) 300 UNIT/ML SOPN Inject 50 Units into the skin 2 (two) times daily.    [name] ofloxacin (FLOXIN) 0.3 % OTIC solution Place 10 drops into the right ear daily. (Patient not taking: Reported on 01/29/2017) 5 mL 0  . predniSONE  (DELTASONE) 20 MG tablet 2 tabs po qd x  5d (Patient not taking: Reported on 01/29/2017) 10 tablet 0   No facility-administered medications prior to visit.     Allergies  Allergen Reactions  . Morphine And Related Other (See Comments)    Drenched with perspiration  . Entresto [Sacubitril-Valsartan]     hypotension  . Demerol [Meperidine] Nausea Only  . Starlix [Nateglinide] Other (See Comments)    gassy    ROS As per HPI  PE: Blood pressure 124/67, pulse 66, temperature 98.5 F (36.9 C), temperature source Oral, weight 236 lb (107 kg), SpO2 95 %. Gen: Alert, well appearing.  Patient is oriented to person, place, time, and situation. AFFECT: pleasant, lucid thought and speech. Oropharynx normal.  L TM and L eAC normal. R eAC normal, R TM with a bit of sclerotic appearance and retracted a bit.  No erythema and no perforation.  No fluid visible behind TM. Nose: clear. CV: RRR, no m/r/g.   LUNGS: CTA bilat, nonlabored resps, good aeration in all lung fields. EXT: trace pitting edema in LLL.  No edema of R leg.  LABS:    Chemistry      Component Value Date/Time   NA 145 01/11/2017 1018   K 4.4 01/11/2017 1018   CL 103 01/11/2017 1018   CO2 22 01/11/2017 1018   BUN 21 01/11/2017 1018   CREATININE 1.22 (H) 01/11/2017 1018      Component Value Date/Time   CALCIUM 9.2 01/11/2017 1018   ALKPHOS 81 08/27/2016 1630   AST 21 01/11/2017 1018   ALT 21 01/11/2017 1018   BILITOT 0.3 01/11/2017 1018       IMPRESSION AND PLAN:  1) Right AOM with perforation: this is MUCH improved.  Just a bit of TM retraction remaining.   Encouraged pt to occ blow nose against closed nostrils to get TM moving/flexible again. No further meds needed for this.  2) Mild COPD exacerbation: resolved. He is back to baseline pulm status.  An After Visit Summary was printed and given to the patient.  FOLLOW UP: Return for keep 04/19/17 appt with me (this [birth year] his birthday): also, schedule AWV if his  last one was > 1 yr ago..  Signed:  Crissie Sickles, MD           01/29/2017

## 2017-02-04 NOTE — Telephone Encounter (Signed)
Patient has received medications this has been resolved- spoke to the patient today

## 2017-02-08 ENCOUNTER — Other Ambulatory Visit: Payer: Self-pay | Admitting: Endocrinology

## 2017-02-08 ENCOUNTER — Other Ambulatory Visit: Payer: Self-pay | Admitting: Family Medicine

## 2017-02-08 DIAGNOSIS — E083293 Diabetes mellitus due to underlying condition with mild nonproliferative diabetic retinopathy without macular edema, bilateral: Secondary | ICD-10-CM | POA: Diagnosis not present

## 2017-02-08 DIAGNOSIS — E1169 Type 2 diabetes mellitus with other specified complication: Secondary | ICD-10-CM | POA: Diagnosis not present

## 2017-02-09 ENCOUNTER — Telehealth: Payer: Self-pay | Admitting: Family Medicine

## 2017-02-09 MED ORDER — MIRTAZAPINE 45 MG PO TABS: 45.0000 mg | ORAL_TABLET | Freq: Every day | ORAL | 5 refills | Status: DC

## 2017-02-09 NOTE — Telephone Encounter (Signed)
Pt is to be taking the 45 mg mirtazapine. Yesterday we received a refill request for pts mirtazapine, I did not notice that the request was for 109m, Rx was sent. I called pharmacy today and cancelled Rx for 358mand I eRxed 459mabs.

## 2017-02-09 NOTE — Telephone Encounter (Signed)
Copied from [place] [id number] [id number]. Topic: Quick Communication - See Telephone Encounter >> Feb 09, 2017 10:18 AM Hewitt Shorts wrote: CRM for notification. See Telephone encounter for: pt pharmacy is calling to confirm dosage for mirtazapine  best number [phone] for the pharmacy   Best number to patient is   02/09/17.

## 2017-02-11 ENCOUNTER — Ambulatory Visit: Payer: Medicare Other | Admitting: Orthotics

## 2017-02-12 ENCOUNTER — Telehealth: Payer: Self-pay | Admitting: Endocrinology

## 2017-02-12 NOTE — Telephone Encounter (Signed)
Did you receive this paperwork?  Please advise. 

## 2017-02-12 NOTE — Telephone Encounter (Signed)
Pts wife is aware

## 2017-02-12 NOTE — Telephone Encounter (Signed)
Did you receive this paperwork?  Please advise.

## 2017-02-12 NOTE — Telephone Encounter (Signed)
I have it and he will have completed form on Monday

## 2017-02-12 NOTE — Telephone Encounter (Signed)
Patient's wife [name] called re: did Dr Dwyane Dee receive the paperwork from [place]. Please call ph# [phone] to let her know status.

## 2017-02-12 NOTE — Telephone Encounter (Signed)
Dr March Rummage from [facility] faxed over a form for patient orthotics, did you receive the fax?

## 2017-02-14 DIAGNOSIS — J449 Chronic obstructive pulmonary disease, unspecified: Secondary | ICD-10-CM | POA: Diagnosis not present

## 2017-02-14 DIAGNOSIS — J9611 Chronic respiratory failure with hypoxia: Secondary | ICD-10-CM | POA: Diagnosis not present

## 2017-02-14 DIAGNOSIS — I5032 Chronic diastolic (congestive) heart failure: Secondary | ICD-10-CM | POA: Diagnosis not present

## 2017-02-15 NOTE — Telephone Encounter (Signed)
Have you faxed this yet?

## 2017-02-15 NOTE — Telephone Encounter (Signed)
This was faxed today

## 2017-02-15 NOTE — Telephone Encounter (Signed)
This has been completed and given to [facility]

## 2017-02-16 ENCOUNTER — Ambulatory Visit: Payer: Medicare Other | Admitting: Orthotics

## 2017-02-16 DIAGNOSIS — M2040 Other hammer toe(s) (acquired), unspecified foot: Secondary | ICD-10-CM

## 2017-02-16 NOTE — Progress Notes (Signed)
Shoes to big.reordered size 11Z

## 2017-02-23 ENCOUNTER — Ambulatory Visit (HOSPITAL_COMMUNITY)
Admission: RE | Admit: 2017-02-23 | Discharge: 2017-02-23 | Disposition: A | Discharge status: Home / Self Care | Payer: Medicare Other | Source: Ambulatory Visit | Attending: Cardiology | Admitting: Cardiology

## 2017-02-23 ENCOUNTER — Encounter (HOSPITAL_COMMUNITY): Payer: Self-pay | Admitting: Cardiology

## 2017-02-23 VITALS — BP 128/54 | HR 60 | Wt 232.8 lb

## 2017-02-23 DIAGNOSIS — I447 Left bundle-branch block, unspecified: Secondary | ICD-10-CM | POA: Diagnosis not present

## 2017-02-23 DIAGNOSIS — Z885 Allergy status to narcotic agent status: Secondary | ICD-10-CM | POA: Insufficient documentation

## 2017-02-23 DIAGNOSIS — Z79899 Other long term (current) drug therapy: Secondary | ICD-10-CM | POA: Diagnosis not present

## 2017-02-23 DIAGNOSIS — Z8249 Family history of ischemic heart disease and other diseases of the circulatory system: Secondary | ICD-10-CM | POA: Insufficient documentation

## 2017-02-23 DIAGNOSIS — Z803 Family history of malignant neoplasm of breast: Secondary | ICD-10-CM | POA: Diagnosis not present

## 2017-02-23 DIAGNOSIS — I480 Paroxysmal atrial fibrillation: Secondary | ICD-10-CM | POA: Diagnosis not present

## 2017-02-23 DIAGNOSIS — I959 Hypotension, unspecified: Secondary | ICD-10-CM | POA: Diagnosis not present

## 2017-02-23 DIAGNOSIS — Z833 Family history of diabetes mellitus: Secondary | ICD-10-CM | POA: Diagnosis not present

## 2017-02-23 DIAGNOSIS — I2510 Atherosclerotic heart disease of native coronary artery without angina pectoris: Secondary | ICD-10-CM | POA: Insufficient documentation

## 2017-02-23 DIAGNOSIS — I5022 Chronic systolic (congestive) heart failure: Secondary | ICD-10-CM | POA: Diagnosis not present

## 2017-02-23 DIAGNOSIS — Z7901 Long term (current) use of anticoagulants: Secondary | ICD-10-CM | POA: Insufficient documentation

## 2017-02-23 DIAGNOSIS — I255 Ischemic cardiomyopathy: Secondary | ICD-10-CM | POA: Insufficient documentation

## 2017-02-23 DIAGNOSIS — E785 Hyperlipidemia, unspecified: Secondary | ICD-10-CM

## 2017-02-23 DIAGNOSIS — Z823 Family history of stroke: Secondary | ICD-10-CM | POA: Diagnosis not present

## 2017-02-23 DIAGNOSIS — E119 Type 2 diabetes mellitus without complications: Secondary | ICD-10-CM | POA: Diagnosis not present

## 2017-02-23 DIAGNOSIS — I110 Hypertensive heart disease with heart failure: Secondary | ICD-10-CM | POA: Diagnosis not present

## 2017-02-23 DIAGNOSIS — Z951 Presence of aortocoronary bypass graft: Secondary | ICD-10-CM | POA: Insufficient documentation

## 2017-02-23 DIAGNOSIS — Z87891 Personal history of nicotine dependence: Secondary | ICD-10-CM | POA: Insufficient documentation

## 2017-02-23 DIAGNOSIS — J449 Chronic obstructive pulmonary disease, unspecified: Secondary | ICD-10-CM | POA: Diagnosis not present

## 2017-02-23 DIAGNOSIS — Z888 Allergy status to other drugs, medicaments and biological substances status: Secondary | ICD-10-CM | POA: Diagnosis not present

## 2017-02-23 DIAGNOSIS — Z794 Long term (current) use of insulin: Secondary | ICD-10-CM | POA: Insufficient documentation

## 2017-02-23 DIAGNOSIS — J438 Other emphysema: Secondary | ICD-10-CM

## 2017-02-23 LAB — BASIC METABOLIC PANEL
ANION GAP: 14 (ref 5–15)
BUN: 24 mg/dL — ABNORMAL HIGH (ref 6–20)
CHLORIDE: 105 mmol/L (ref 101–111)
CO2: 23 mmol/L (ref 22–32)
Calcium: 9.3 mg/dL (ref 8.9–10.3)
Creatinine, Ser: 1.08 mg/dL (ref 0.61–1.24)
GFR calc Af Amer: >60 mL/min (ref >60)
GLUCOSE: 149 mg/dL — AB (ref 65–99)
POTASSIUM: 4.4 mmol/L (ref 3.5–5.1)
Sodium: 142 mmol/L (ref 135–145)

## 2017-02-23 LAB — LIPID PANEL
CHOL/HDL RATIO: 3.6 ratio
Cholesterol: 112 mg/dL (ref 0–200)
HDL: 31 mg/dL — AB (ref >40)
LDL Cholesterol: 5 mg/dL (ref 0–99)
Triglycerides: 382 mg/dL — ABNORMAL HIGH (ref <150)
VLDL: 76 mg/dL — ABNORMAL HIGH (ref 0–40)

## 2017-02-23 LAB — DIGOXIN LEVEL: DIGOXIN LVL: 0.4 ng/mL — AB (ref 0.8–2.0)

## 2017-02-23 MED ORDER — BISOPROLOL FUMARATE 5 MG PO TABS: 7.5000 mg | ORAL_TABLET | Freq: Every day | ORAL | 6 refills | Status: DC

## 2017-02-23 NOTE — Patient Instructions (Signed)
Increase Bisoprolol 7.5 mg (1.5 tabs) daily  Labs drawn today (if we do not call you, then your lab work was stable)   Your physician recommends that you schedule a follow-up appointment in: 3 months with Dr. [name]

## 2017-02-23 NOTE — Progress Notes (Signed)
Advanced Heart Failure Clinic Note   Primary Care: Dr. Anitra Lauth Primary Cardiologist: Dr. [name]  HPI:  Joshua [name] is a 77 y.o. 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   Admitted 5/31 -> 06/11/16 with worsening SOB, volume overload, and increased 02 demand. Echo showed EF down to 25-30%.  Diuresed on IV lasix and given solumedrol. Thought to be mixed AECOPD and acute CHF. Moved to stepdown 06/05/16 with worsening resp status and BiPAP requirement. Improved with nebs, steroids, ABX and IV lasix + metolazone. Sent home on [place] and prednisone taper. Overall diuresed 22 lbs. He had left and right heart cath with patent grafts and no interventional target.  Discharge weight 233 lbs.   Pt admitted from 6/11 -> 06/17/16 with syncope (unwitnessed) Noted to have fall x 3. EKG with LBBB. Creatinine mildly elevated. CT head negative for acute process. R hip xray negative for fracture or dislocated hardware.  Felt to be vasovagal. Received NS and diuretics held up to discharge.  Creatinine improved.  Unable to tolerate Entresto due to lightheadedness/hypotension.   He returns today for followup of CHF.  He is stable symptomatically. Weight down 5 lbs.  Walks with walker for balance.  He is short of breath walking up stairs. He is short of breath walking 200-300 feet.  No chest pain. No orthopnea/PND.    Labs (8/18): K 4.7, creatinine 1.05 Labs (9/18): K 4.3, creatinine 1.44 Labs (10/18): K 4.9, creatinine 0.99 Labs (11/18): K 4.5, creatinine 1.32, digoxin 0.3  PMH:  1. CAD: s/p CABG in 8/15.  - LHC (6/18) with 80% distal LM, TO pLAD, TO ostial LCx, subtotal occlusion distal RCA.  LIMA-LAD patent, Y graft to OM and D patent (50% stenosis in graft at touchdown on OM), patent SVG-PDA.  2. Chronic systolic CHF: Ischemic cardiomyopathy.  Echo (6/18) with EF 25-30%, diffuse HK, moderate MR, mildly dilated RV with mildly decreased systolic function, PASP  48 mmHg.  - RHC (6/18): mean RA 10, PA 45/26, mean PCWP 21, CI 2.11 (Fick) and 2.41 (thermo), PVR 2.9 WU.  - Echo (9/18): EF 40-45%, septal-lateral dyssynchrony, diffuse hypokinesis, normal RV size and systolic function.  3. COPD: Prior smoker.   4. Hyperlipidemia 5. Atrial fibrillation: Paroxysmal.  6. Type II diabetes 7. HTN 8. Anemia: h/o Fe deficiency 9. 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  10. H/o pericardial effusion with tamponade in 6/15.  11. HTN 12. LBBB  Review of systems complete and found to be negative unless listed in HPI.     Current Outpatient Medications  Medication Sig Dispense Refill  . ACCU-CHEK GUIDE test strip USE TO CHECK BLOOD SUGAR 3 TIMES DAILY 100 each 11  . acetaminophen (TYLENOL) 325 MG tablet Take 2 tablets (650 mg total) by mouth every 4 (four) hours as needed for headache or mild pain.    . Ascorbic Acid (VITAMIN C) 1000 MG tablet Take 1,000 mg by mouth daily.    [name] atorvastatin (LIPITOR) 40 MG tablet TAKE ONE TABLET BY MOUTH EVERY DAY 90 tablet 1  . bisoprolol (ZEBETA) 5 MG tablet Take 1.5 tablets (7.5 mg total) by mouth daily. 45 tablet 6  . Cholecalciferol (VITAMIN D-3) 1000 units CAPS Take 1,000 Units by mouth daily. Reported on 02/26/2015    . DIGOX 125 MCG tablet TAKE ONE TABLET BY MOUTH EVERY DAY 30 tablet 6  . fluticasone (FLONASE) 50 MCG/ACT nasal spray Place 2  sprays into both nostrils daily. 16 g 1  . gabapentin (NEURONTIN) 100 MG capsule Take 200 mg by mouth 3 (three) times daily.    [name] guaiFENesin (MUCINEX) 600 MG 12 hr tablet Take 2 tablets (1,200 mg total) by mouth 2 (two) times daily. 30 tablet 0  . hyoscyamine (LEVSIN SL) 0.125 MG SL tablet 1-2 tabs po q6h prn diarrhea 60 tablet 3  . Insulin Disposable Pump (V-GO 20) KIT Apply one Vgo 20 pump daily. 30 kit 3  . Insulin Glargine (TOUJEO MAX SOLOSTAR) 300 UNIT/ML SOPN Inject 50 Units into the skin 2 (two) times daily.    . insulin regular human  CONCENTRATED (HUMULIN R) 500 UNIT/ML injection Use 56 units daily in insulin pump 20 mL 3  . Insulin Syringe-Needle U-100 30G X 1/2" 1 ML MISC Use to fill insulin VGo pump one time daily 100 each 2  . isosorbide mononitrate (IMDUR) 30 MG 24 hr tablet TAKE ONE TABLET BY MOUTH EVERY DAY 90 tablet 1  . losartan (COZAAR) 25 MG tablet Take 1 tablet (25 mg total) 2 (two) times daily with a meal by mouth. 60 tablet 3  . mirtazapine (REMERON) 45 MG tablet Take 1 tablet (45 mg total) by mouth at bedtime. 30 tablet 5  . Multiple Vitamin (MULTIVITAMIN WITH MINERALS) TABS Take 1 tablet by mouth daily.    . nitroGLYCERIN (NITROSTAT) 0.4 MG SL tablet Place 1 tablet (0.4 mg total) under the tongue every 5 (five) minutes as needed for chest pain. 25 tablet 3  . ofloxacin (FLOXIN) 0.3 % OTIC solution Place 10 drops into the right ear daily. 5 mL 0  . OXYGEN Inhale 2 L into the lungs.    [name] PROAIR HFA 108 (90 Base) MCG/ACT inhaler INHALE 2 PUFFS INTO THE LUNGS EVERY 4 HOURS AS NEEDED FOR WHEEZING ORSHORTNESS OF BREATH 8.5 g 1  . rivaroxaban (XARELTO) 20 MG TABS tablet Take 1 tablet (20 mg total) by mouth daily with supper. 90 tablet 3  . sertraline (ZOLOFT) 25 MG tablet TAKE TWO TABLETS BY MOUTH EVERY DAY 60 tablet 3  . spironolactone (ALDACTONE) 25 MG tablet TAKE ONE TABLET BY MOUTH EVERY DAY 30 tablet 6  . SYMBICORT 160-4.5 MCG/ACT inhaler INHALE 2 PUFFS INTO THE LUNGS TWICE DAILY 10.2 g 5  . SYNJARDY 05-998 MG TABS TAKE ONE TABLET BY MOUTH TWICE DAILY AFTER A MEAL 60 tablet 2  . tamsulosin (FLOMAX) 0.4 MG CAPS capsule TAKE 1 CAPSULE BY MOUTH EVERY DAY AFTER SUPPER 90 capsule 1  . torsemide (DEMADEX) 20 MG tablet Take 40 mg by mouth daily. Takes 2 tablet daily     No current facility-administered medications for this encounter.     Allergies  Allergen Reactions  . Morphine And Related Other (See Comments)    Drenched with perspiration  . Entresto [Sacubitril-Valsartan]     hypotension  . Demerol  [Meperidine] Nausea Only  . Starlix [Nateglinide] Other (See Comments)    gassy      Social History   Socioeconomic History  . Marital status: Married    Spouse name: [name]  . Number of children: 4  . Years of education: Not on file  . Highest education level: Not on file  Social Needs  . Financial resource strain: Not on file  . Food insecurity - worry: Not on file  . Food insecurity - inability: Not on file  . Transportation needs - medical: Not on file  . Transportation needs - non-medical: Not on file  Occupational History  . Occupation: retired, former Scientist, forensic  Tobacco Use  . Smoking status: Former Smoker    Packs/day: 0.25    Years: 62.00    Pack years: 15.50    Types: Cigarettes    Last attempt to quit: 10/23/2015    Years since quitting: 1.3  . Smokeless tobacco: Former Systems developer    Quit date: 06/30/2013  Substance and Sexual Activity  . Alcohol use: No    Alcohol/week: 0.0 oz  . Drug use: No  . Sexual activity: No  Other Topics Concern  . Not on file  Social History Narrative   Married, 4 children.   Formerly a Medical illustrator.   Level of education: HS.     +tobacco--lifelong/ quit 2015).  No alcohol or drugs.   No exercise.  +excessive caffeine.      Lives in 1 story home with his wife and her sister      Family History  Problem Relation Age of Onset  . Heart disease Father   . Heart attack Father   . CVA Mother   . Hypertension Mother   . Diabetes Paternal Grandmother   . Breast cancer Sister   . Diabetes Maternal Uncle   . Heart disease Maternal Uncle   . Stroke Neg Hx     Vitals:   02/23/17 0848  BP: (!) 128/54  Pulse: 60  SpO2: 95%  Weight: 232 lb 12.8 oz (105.6 kg)   Wt Readings from Last 3 Encounters:  02/23/17 232 lb 12.8 oz (105.6 kg)  01/29/17 236 lb (107 kg)  01/15/17 239 lb 8 oz (108.6 kg)     PHYSICAL EXAM: General: NAD Neck: No JVD, no thyromegaly or thyroid nodule.  Lungs: Clear to auscultation  bilaterally with normal respiratory effort. CV: Nondisplaced PMI.  Heart regular S1/S2, no S3/S4, no murmur.  No peripheral edema.  No carotid bruit.  Normal pedal pulses.  Abdomen: Soft, nontender, no hepatosplenomegaly, no distention.  Skin: Intact without lesions or rashes.  Neurologic: Alert and oriented x 3.  Psych: Normal affect. Extremities: No clubbing or cyanosis.  HEENT: Normal.   ASSESSMENT & PLAN:  1. Chronic systolic CHF: Ischemic cardiomyopathy.  Echo (6/18) with EF 25-30%.  Repeat echo in 9/18 with EF 40-45%, septal-lateral dyssynchrony.  Stable NYHA class II-III symptoms.  Weight down 5 lbs.  - Continue torsemide 40 mg daily and spironolactone 25 mg daily. BMET today.  - He did not tolerate low dose Entresto due to hypotension. Continue losartan 25 mg bid. - Continue digoxin, check level today. - Increase bisoprolol to 7.5 mg daily.  - EF is > 35% on last echo, not candidate for CRT-D at this point.   2. COPD: No longer smoking.  Not using home oxygen at this time.  3. CAD s/p CABG: Cath 06/10/16 with severe 3 vessel native disease with patent bypass grafts. No interventional target.  - Continue statin.  With stable CAD and Xarelto use, does not need to be on ASA.  4. Atrial fibrillation: Paroxysmal.  He is in NSR today.  - Continue Xarelto.   Followup in 3 months.   Loralie Champagne, MD 02/23/17

## 2017-02-24 ENCOUNTER — Encounter: Payer: Self-pay | Admitting: Endocrinology

## 2017-02-24 ENCOUNTER — Ambulatory Visit: Payer: Medicare Other | Admitting: Endocrinology

## 2017-02-24 ENCOUNTER — Other Ambulatory Visit: Payer: Self-pay

## 2017-02-24 ENCOUNTER — Telehealth: Payer: Self-pay | Admitting: Endocrinology

## 2017-02-24 VITALS — BP 128/60 | HR 69 | Ht 71.0 in | Wt 233.8 lb

## 2017-02-24 DIAGNOSIS — Z794 Long term (current) use of insulin: Secondary | ICD-10-CM | POA: Diagnosis not present

## 2017-02-24 DIAGNOSIS — E1165 Type 2 diabetes mellitus with hyperglycemia: Secondary | ICD-10-CM

## 2017-02-24 MED ORDER — "INSULIN SYRINGE-NEEDLE U-100 30G X 1/2"" 1 ML MISC": 2 refills | Status: DC

## 2017-02-24 NOTE — Progress Notes (Signed)
Patient ID: Joshua [name], male   DOB: 1940/03/10, 77 y.o.   MRN: [id number]            Reason for Appointment:  Follow-up for Type 2 Diabetes  Referring physician: [place]   History of Present Illness:          Date of diagnosis of type 2 diabetes mellitus: ?  2002        Background history:   He apparently had been treated with metformin initially which was continued until about 2016. He previously has also been tried on Amaryl, Actos, Farxiga and Victoza but he claims that these did not help his blood sugar and were stopped Appears to have been on insulin since about 2014 but he does not remember details. He has been taking mostly Levemir insulin as a basal insulin but was also on Lantus in 2015 for some time His best A1c has been 7.3, once in 2014 and another time in 2016, otherwise his A1c has been higher and as high as 10.8  Recent history:   INSULIN regimen U-500 insulin with V-go 20 units basal, bolus clicks: 5-7-9 at meals    Non-insulin hypoglycemic drugs the patient is taking are: Synjardy 05/998 twice a day breakfast and suppertime  His last A1c was 9.5 in January  Current management, blood sugar patterns and problems identified:  He was advised to check blood sugars more often and not just in the morning on his last visit but he still is only checking fasting  Although his blood sugars are averaging 190 fasting 100 last visit they are now averaging only about 120  He has had a few readings in the 60s in the mornings and he will did some blurred vision with this  However he thinks his blood sugars may be lower in the morning when he does not have a bedtime snack with peanut butter crackers if he is not hungry  He does have a few sporadic readings later in the day which appear to be high last month and 177 late morning last weekend  Lab glucose was not high after breakfast recently  He thinks his portions are controlled  Avoiding sweets and drinks with sugar  usually  Unable to exercise because of his physical limitations  He was told to take the [place].  Lunch instead of suppertime but he is still taking it at suppertime    Side effects from medications have been: None  Compliance with the medical regimen: Fair Hypoglycemia: Never    Glucose monitoring:  done 2 times a day         Glucometer:  Accu-Chek .      Blood Glucose readings with averages from download   FASTING range 62-197 with average about 120, overall average 121  Self-care: The diet that the patient has been following is: tries to limit Sweet drinks .     Meal times are:  Breakfast is at 7 a.m.  Dinner: 5-6 PM    Typical meal intake: Breakfast is usually eggs,  Sausage, oatmeal or grits.  Lunch is a sandwich, any evening he has meat and vegetables, For snacks he will have fruit or crackers                Dietician visit, most recent: 01/09/16 CDE visit: 12/18               Exercise: Unable to do any     Weight history:  Wt Readings from Last 3  Encounters:  02/24/17 233 lb 12.8 oz (106.1 kg)  02/23/17 232 lb 12.8 oz (105.6 kg)  01/29/17 236 lb (107 kg)    Glycemic control:   Lab Results  Component Value Date   HGBA1C 9.5 01/12/2017   HGBA1C 9.3 08/20/2016   HGBA1C 7.3 (H) 06/04/2016   Lab Results  Component Value Date   MICROALBUR 1.4 07/07/2016   [place] 5 02/23/2017   CREATININE 1.08 02/23/2017   Lab Results  Component Value Date   MICRALBCREAT 1.2 07/07/2016    Other active problems: See review of systems   Allergies as of 02/24/2017      Reactions   Morphine And Related Other (See Comments)   Drenched with perspiration   Entresto [sacubitril-valsartan]    hypotension   Demerol [meperidine] Nausea Only   Starlix [nateglinide] Other (See Comments)   gassy      Medication List        Accurate as of 02/24/17 10:02 AM. Always use your most recent med list.          ACCU-CHEK GUIDE test strip Generic drug:  glucose blood USE TO CHECK  BLOOD SUGAR 3 TIMES DAILY   acetaminophen 325 MG tablet Commonly known as:  TYLENOL Take 2 tablets (650 mg total) by mouth every 4 (four) hours as needed for headache or mild pain.   atorvastatin 40 MG tablet Commonly known as:  LIPITOR TAKE ONE TABLET BY MOUTH EVERY DAY   bisoprolol 5 MG tablet Commonly known as:  ZEBETA Take 1.5 tablets (7.5 mg total) by mouth daily.   DIGOX 0.125 MG tablet Generic drug:  digoxin TAKE ONE TABLET BY MOUTH EVERY DAY   fluticasone 50 MCG/ACT nasal spray Commonly known as:  FLONASE Place 2 sprays into both nostrils daily.   gabapentin 100 MG capsule Commonly known as:  NEURONTIN Take 200 mg by mouth 3 (three) times daily.   guaiFENesin 600 MG 12 hr tablet Commonly known as:  MUCINEX Take 2 tablets (1,200 mg total) by mouth 2 (two) times daily.   hyoscyamine 0.125 MG SL tablet Commonly known as:  LEVSIN SL 1-2 tabs po q6h prn diarrhea   insulin regular human CONCENTRATED 500 UNIT/ML injection Commonly known as:  HUMULIN R Use 56 units daily in insulin pump   Insulin Syringe-Needle U-100 30G X 1/2" 1 ML Misc Use to fill insulin VGo pump one time daily   isosorbide mononitrate 30 MG 24 hr tablet Commonly known as:  IMDUR TAKE ONE TABLET BY MOUTH EVERY DAY   losartan 25 MG tablet Commonly known as:  COZAAR Take 1 tablet (25 mg total) 2 (two) times daily with a meal by mouth.   mirtazapine 45 MG tablet Commonly known as:  REMERON Take 1 tablet (45 mg total) by mouth at bedtime.   multivitamin with minerals Tabs tablet Take 1 tablet by mouth daily.   nitroGLYCERIN 0.4 MG SL tablet Commonly known as:  NITROSTAT Place 1 tablet (0.4 mg total) under the tongue every 5 (five) minutes as needed for chest pain.   OXYGEN Inhale 2 L into the lungs.   PROAIR HFA 108 (90 Base) MCG/ACT inhaler Generic drug:  albuterol INHALE 2 PUFFS INTO THE LUNGS EVERY 4 HOURS AS NEEDED FOR WHEEZING ORSHORTNESS OF BREATH   rivaroxaban 20 MG Tabs  tablet Commonly known as:  XARELTO Take 1 tablet (20 mg total) by mouth daily with supper.   sertraline 25 MG tablet Commonly known as:  ZOLOFT TAKE TWO TABLETS BY MOUTH  EVERY DAY   spironolactone 25 MG tablet Commonly known as:  ALDACTONE TAKE ONE TABLET BY MOUTH EVERY DAY   SYMBICORT 160-4.5 MCG/ACT inhaler Generic drug:  budesonide-formoterol INHALE 2 PUFFS INTO THE LUNGS TWICE DAILY   SYNJARDY 05-998 MG Tabs Generic drug:  Empagliflozin-Metformin HCl TAKE ONE TABLET BY MOUTH TWICE DAILY AFTER A MEAL   tamsulosin 0.4 MG Caps capsule Commonly known as:  FLOMAX TAKE 1 CAPSULE BY MOUTH EVERY DAY AFTER SUPPER   torsemide 20 MG tablet Commonly known as:  DEMADEX Take 40 mg by mouth daily. Takes 2 tablet daily   TOUJEO MAX SOLOSTAR 300 UNIT/ML Sopn Generic drug:  Insulin Glargine Inject 50 Units into the skin 2 (two) times daily.   V-GO 20 Kit Apply one Vgo 20 pump daily.   vitamin C 1000 MG tablet Take 1,000 mg by mouth daily.   Vitamin D-3 1000 units Caps Take 1,000 Units by mouth daily. Reported on 02/26/2015       Allergies:  Allergies  Allergen Reactions  . Morphine And Related Other (See Comments)    Drenched with perspiration  . Entresto [Sacubitril-Valsartan]     hypotension  . Demerol [Meperidine] Nausea Only  . Starlix [Nateglinide] Other (See Comments)    gassy    Past Medical History:  Diagnosis Date  . Acute respiratory distress 06/04/2016  . Adenomatous colon polyp 10/16/2011   Repeat 2018  . Arthritis     Hips, R>L & KNEES  . CAD, multiple vessel    3V CAD cath 08/08/13----CABG done shortly after.  Cath 06/2016: clear bipasses/no interventional lesion.  . 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 pylori neg, no dysplasia or malignancy.  . Chronic combined systolic and diastolic CHF, NYHA class 2 ([facility]) 2016-2018   Followed by CHF clinic  . Chronic renal insufficiency, stage 3 (moderate) (HCC) 2018   GFR 50s   . COPD (chronic obstructive pulmonary disease) ([facility])    GOLD II.  Spirometry  2004 borderline obstruction; 2015 mod obst: noncompliant with bid symbicort so pulmonologist switched him to a once daily inhaler: Trelegy ellipta 12/2015.  Oxygen prn: goal 88-92%.  . Diabetic nephropathy ([facility])    Elevated urine microalb/cr 03/2011  . DM type 2 (diabetes mellitus, type 2) ([facility])    Poor control on max oral meds--pt eventually agreed to insulin therapy.  As of 2017 his DM is being managed by Dr. Dwyane Dee in endo.  . DOE (dyspnea on exertion)    COPD + chronic diastolic HF  . Dysthymia    Worsened after 2015 heart surgery.  Memory loss +? as well.  Neuro eval 01/2017--plan is to get MRI brain, B12 level, and neuropsychiatric testing.  . Erectile dysfunction    Normal testosterone  . Hearing loss of both ears 2016   Hearing aids  . Hyperlipidemia   . Hyperplastic colon polyp 2001   No further colonoscopies to be done as of 11/2016 GI eval.  . Hypertension   . IBS (irritable bowel syndrome)    responds well to levsin  . Iron deficiency anemia 2014   03/2012 capsule endoscopy showed 2 AVMs--likely responsible for his IDA--lifetime iron supp recommended + q91moCBCs.  . Ischemic cardiomyopathy 06/2016; 09/2016   06/2016: EF 25-30%, global hypokinesis, grd II DD, vent septum motion changes c/w LBBB, mod MV regurg, L atr mod/sev dilat, R atr mod dilat, mod incr pulm press, Grd II DD.  09/2016--EF 40-45%; diffuse hypokinesis, Grd I DD.  .[name]  Macular degeneration, dry    Mild, bilat (Optometrist, Mayford Knife at [facility] [place] in [place], [place])  . Memory impairment   . Obesity   . Open toe wound 11/2015   Wound care clinic appt made and then canceled when toe improved.  . Other and unspecified angina pectoris   . PAD (peripheral artery disease) ([facility]) 02/2016   Abnormal ABI's and waveforms: LE arterial duplex ordered for f/u as per cardiologist's recommendation.  Vasc eval by Dr. Bridgett Larsson: impression was  minimal PAD, recommended maximize med mgmt.  [name] PAF (paroxysmal atrial fibrillation) ([facility]) 10/2014   xarelto  . Pericardial effusion with cardiac tamponade 6//29/15   pericardiocentesis was done,  Infectious/inflamm (cytology showed NO MALIGNANT CELLS)  . Pleural plaque    Pleural plaques/asbestosis changes on CT chest done by pulm 05/2016.  [name] Pneumonia   . Tobacco dependence in remission    100+ pack-yr hx: quit after CABG    Past Surgical History:  Procedure Laterality Date  . APPENDECTOMY  1957  . CARDIAC CATHETERIZATION  08/08/2013  . CATARACT EXTRACTION W/ INTRAOCULAR LENS  IMPLANT, BILATERAL  04/08/2006 & 04/22/2006  . CATARACT EXTRACTION W/ INTRAOCULAR LENS  IMPLANT, BILATERAL Bilateral   . CHOLECYSTECTOMY OPEN  1999  . COLONOSCOPY  10/16/2011   Procedure: COLONOSCOPY;  Surgeon: Inda Castle, MD;  Location: [facility];  Service: Endoscopy;  Laterality: N/A;  . CORONARY ARTERY BYPASS GRAFT N/A 08/14/2013   Procedure: CORONARY ARTERY BYPASS GRAFTING (CABG);  Surgeon: Melrose Nakayama, MD;  Location: [facility];  Service: Open Heart Surgery;  Laterality: N/A;  Times 4   using left internal mammary artery and endoscopically harvested bilateral saphenous vein  . ESOPHAGOGASTRODUODENOSCOPY  02/25/12   Atrophic gastritis with a few erosions--capsule endoscopy planned as of 02/25/12 (Dr. Deatra Ina).  [name] HARDWARE REMOVAL Right 08/12/2012   Procedure: HARDWARE REMOVAL;  Surgeon: Mcarthur Rossetti, MD;  Location: [facility];  Service: Orthopedics;  Laterality: Right;  . HIP SURGERY Right 1954   Repair of slipped capital femoral epiphysis.  . INTRAOPERATIVE TRANSESOPHAGEAL ECHOCARDIOGRAM N/A 08/14/2013   Normal LV function. Procedure: INTRAOPERATIVE TRANSESOPHAGEAL ECHOCARDIOGRAM;  Surgeon: Melrose Nakayama, MD;  Location: [facility];  Service: Open Heart Surgery;  Laterality: N/A;  . LEFT HEART CATHETERIZATION WITH CORONARY ANGIOGRAM N/A 08/08/2013   Procedure: LEFT HEART CATHETERIZATION WITH CORONARY  ANGIOGRAM;  Surgeon: Jettie Booze, MD;  Location: [facility] CATH LAB;  Service: Cardiovascular;  Laterality: N/A;  . PATELLA FRACTURE SURGERY Left ~ 1979   bolt + 3 screws to repair tib plateau fx  . PERICARDIAL TAP N/A 07/01/2013   Procedure: PERICARDIAL TAP;  Surgeon: Jettie Booze, MD;  Location: [facility] CATH LAB;  Service: Cardiovascular;  Laterality: N/A;  . RIGHT/LEFT HEART CATH AND CORONARY ANGIOGRAPHY N/A 06/10/2016   Procedure: Right/Left Heart Cath and Coronary Angiography;  Surgeon: Larey Dresser, MD;  Location: [facility];  Service: Cardiovascular;  Laterality: N/A;  . TESTICLE SURGERY  as a child   Undescended testicle brought down into scrotum  . TONSILLECTOMY  1947  . TOTAL HIP ARTHROPLASTY Right 08/12/2012   Procedure: REMOVAL OF OLD PINS RIGHT HIP AND RIGHT TOTAL HIP ARTHROPLASTY ANTERIOR APPROACH;  Surgeon: Mcarthur Rossetti, MD;  Location: [facility];  Service: Orthopedics;  Laterality: Right;  . TRANSESOPHAGEAL ECHOCARDIOGRAM  09/22/2016   EF 40-45%; diffuse hypokinesis, Grd I DD.  [name] TRANSTHORACIC ECHOCARDIOGRAM  10/30/14   Mod LVH, EF 60-65%, normal wall motion, mod mitral regurg, mild PAH  .  TRANSTHORACIC ECHOCARDIOGRAM  06/2016   06/2016: EF 25-30%, global hypokinesis, grd II DD, vent septum motion changes c/w LBBB, mod MV regurg, L atr mod/sev dilat, R atr mod dilat, mod incr pulm press, Grd II DD.    Family History  Problem Relation Age of Onset  . Heart disease Father   . Heart attack Father   . CVA Mother   . Hypertension Mother   . Diabetes Paternal Grandmother   . Breast cancer Sister   . Diabetes Maternal Uncle   . Heart disease Maternal Uncle   . Stroke Neg Hx     Social History:  reports that he quit smoking about 16 months ago. His smoking use included cigarettes. He has a 15.50 pack-year smoking history. He quit smokeless tobacco use about 3 years ago. He reports that he does not drink alcohol or use drugs.   Review of Systems   Lipid  history: He had been prescribed Lipitor 40 mg With good control    Lab Results  Component Value Date   CHOL 112 02/23/2017   HDL 31 (L) 02/23/2017   LDLCALC 5 02/23/2017   LDLDIRECT 187.0 11/21/2015   TRIG 382 (H) 02/23/2017   CHOLHDL 3.6 02/23/2017           Hypertension:Mild and treated with bisoprolol 5 mg,25 mg losartan  Also on diuretics for CHF Followed by cardiologist   BP Readings from Last 3 Encounters:  02/24/17 128/60  02/23/17 (!) 128/54  01/29/17 124/67     Most recent eye exam was 7/17, Dr Marina Gravel? Has had no retinopathy  Most recent foot exam: 12/17   LABS:  Hospital Outpatient Visit on 02/23/2017  Component Date Value Ref Range Status  . Digoxin Level 02/23/2017 0.4* 0.8 - 2.0 ng/mL Final   Performed at [facility] [address] [place], [place] [address]  . Sodium 02/23/2017 142  135 - 145 mmol/L Final  . Potassium 02/23/2017 4.4  3.5 - 5.1 mmol/L Final  . Chloride 02/23/2017 105  101 - 111 mmol/L Final  . CO2 02/23/2017 23  22 - 32 mmol/L Final  . Glucose, Bld 02/23/2017 149* 65 - 99 mg/dL Final  . BUN 02/23/2017 24* 6 - 20 mg/dL Final  . Creatinine, Ser 02/23/2017 1.08  0.61 - 1.24 mg/dL Final  . Calcium 02/23/2017 9.3  8.9 - 10.3 mg/dL Final  . GFR calc non Af Amer 02/23/2017 >60  >60 mL/min Final  . GFR calc Af Amer 02/23/2017 >60  >60 mL/min Final   Comment: (NOTE) The eGFR has been calculated using the CKD EPI equation. This calculation has not been validated in all clinical situations. eGFR's persistently <60 mL/min signify possible Chronic Kidney Disease.   . Anion gap 02/23/2017 14  5 - 15 Final   Performed at [facility], [place] [address], [place], [place] [address]  . Cholesterol 02/23/2017 112  0 - 200 mg/dL Final  . Triglycerides 02/23/2017 382* <150 mg/dL Final  . HDL 02/23/2017 31* >40 mg/dL Final  . Total CHOL/HDL Ratio 02/23/2017 3.6  RATIO Final  . VLDL 02/23/2017 76* 0 - 40 mg/dL Final  . LDL Cholesterol  02/23/2017 5  0 - 99 mg/dL Final   Comment:        Total Cholesterol/HDL:CHD Risk Coronary Heart Disease Risk Table                     Men   Women  1/2 Average Risk  3.4   3.3  Average Risk       5.0   4.4  2 X Average Risk   9.6   7.1  3 X Average Risk  23.4   11.0        Use the calculated Patient Ratio above and the CHD Risk Table to determine the patient's CHD Risk.        ATP III CLASSIFICATION (LDL):  <100     mg/dL   Optimal  100-129  mg/dL   Near or Above                    Optimal  130-159  mg/dL   Borderline  160-189  mg/dL   High  >190     mg/dL   Very High Performed at [facility] [address], [place], [place] [address]     Physical Examination:  BP 128/60 (BP Location: Left Arm, Patient Position: Sitting, Cuff Size: Normal)   Pulse 69   Ht _0  (1.803 m)   Wt 233 lb 12.8 oz (106.1 kg)   SpO2 92%   BMI 32.61 kg/m       ASSESSMENT:  Diabetes type 2, uncontrolled    See history of present illness for detailed discussion of current diabetes management, blood sugar patterns and problems identified  A1c was over 9% last month   However despite no changes in his regimen his fasting blood sugars appear to be much better and also he is getting periodic mild hypoglycemia early morning indicating that he may be getting excessive basal with the U-500 insulin  The main difficulty with his management is lack of blood sugar monitoring before and after lunch and dinner and after breakfast also This will allow [place] to help adjust his boluses which is difficult to do now and also determine whether his blood sugars are low at bedtime Currently not able to adjust his suppertime bolus because of lack of bedtime readings Also not clear if he is consistently bolusing since last month he did have some sporadic high readings    PLAN:    He will check blood sugars at bedtime for the next 3 days and call us back next week At least for now he will reduce the  suppertime dose to 5 clicks He can have a snack at night more consistently and even if he has a drink of milk with crackers He will change his Synjardy 2 lunch and breakfast and not take it at dinnertime  Patient Instructions  Call with sugars at nite  Have milk or other snack at bedtime  Reduce clicks to 5 at dinner  Take Snjardy at lunch not supper     Elayne Snare 02/24/2017, 10:02 AM   Note: This office note was prepared with Dragon voice recognition system technology. Any transcriptional errors that result from this process are unintentional.    Elayne Snare

## 2017-02-24 NOTE — Telephone Encounter (Signed)
Patient said you kept his meter. Please check and see and let him know if you have it.

## 2017-02-24 NOTE — Patient Instructions (Signed)
Call with sugars at nite  Have milk or other snack at bedtime  Reduce clicks to 5 at dinner  Take Snjardy at lunch not supper

## 2017-02-25 NOTE — Telephone Encounter (Signed)
Meter is not at nurses station, have you seen this pts meter?

## 2017-02-25 NOTE — Telephone Encounter (Signed)
Do not have any meter, we can give him another one if needed

## 2017-02-25 NOTE — Telephone Encounter (Signed)
LVM for pt letting him know

## 2017-02-26 ENCOUNTER — Telehealth: Payer: Self-pay | Admitting: *Deleted

## 2017-02-26 NOTE — Telephone Encounter (Signed)
Patient called and was asking about when his diabetic shoes would be here and I stated that the diabetic inserts usually take about a month [place] and the shoes are usually here within a couple of weeks (depends on the shoes) and when the shoes and inserts come in we will be glad to call him and schedule an appointment with Liliane Channel. Joshua [name]

## 2017-03-02 ENCOUNTER — Other Ambulatory Visit: Payer: Self-pay | Admitting: Interventional Cardiology

## 2017-03-02 ENCOUNTER — Encounter (HOSPITAL_COMMUNITY): Payer: Self-pay

## 2017-03-02 ENCOUNTER — Other Ambulatory Visit (HOSPITAL_COMMUNITY): Payer: Self-pay | Admitting: Student

## 2017-03-02 ENCOUNTER — Telehealth (HOSPITAL_COMMUNITY): Payer: Self-pay

## 2017-03-02 ENCOUNTER — Other Ambulatory Visit: Payer: Self-pay | Admitting: Family Medicine

## 2017-03-02 ENCOUNTER — Ambulatory Visit (INDEPENDENT_AMBULATORY_CARE_PROVIDER_SITE_OTHER): Payer: Medicare Other | Admitting: Orthotics

## 2017-03-02 DIAGNOSIS — E1140 Type 2 diabetes mellitus with diabetic neuropathy, unspecified: Secondary | ICD-10-CM | POA: Diagnosis not present

## 2017-03-02 DIAGNOSIS — M2041 Other hammer toe(s) (acquired), right foot: Secondary | ICD-10-CM | POA: Diagnosis not present

## 2017-03-02 DIAGNOSIS — M2040 Other hammer toe(s) (acquired), unspecified foot: Secondary | ICD-10-CM

## 2017-03-02 NOTE — Telephone Encounter (Signed)
This is a CHF pt.

## 2017-03-02 NOTE — Telephone Encounter (Signed)
Notes recorded by Shirley Muscat, RN on 03/02/2017 at 8:36 AM EST I have been unable to reach this patient by phone. A letter is being sent to the last known home address. Lipid Letter mailed to patient.   ------  Notes recorded by Kerry Dory, CMA on 02/26/2017 at 12:32 PM EST Unable to reach patient. Line busy x [phone] (H)  ------  Notes recorded by Shirley Muscat, RN on 02/25/2017 at 2:03 PM EST Left VM  ------  Notes recorded by Shirley Muscat, RN on 02/25/2017 at 2:02 PM EST Left VM  ------  Notes recorded by Larey Dresser, MD on 02/23/2017 at 3:25 PM EST Good LDL, not sure how accurate it is though. Triglycerides very high, recommend Vascepa 2 g bid.

## 2017-03-03 ENCOUNTER — Telehealth: Payer: Self-pay | Admitting: Endocrinology

## 2017-03-03 NOTE — Telephone Encounter (Signed)
Patients wife is needing speak to someone and give the patients blood sugar readings,  Please advise

## 2017-03-07 ENCOUNTER — Encounter: Payer: Self-pay | Admitting: Family Medicine

## 2017-03-07 ENCOUNTER — Telehealth: Payer: Self-pay | Admitting: Endocrinology

## 2017-03-07 NOTE — Telephone Encounter (Signed)
Blood sugars have been over 300 yesterday morning and over 400 this morning.  He change his V-go pump last night.  Feels weak and thirsty but not nauseated, no other acute problems.  Blood sugar has not come down with extra 50 units of Novolin regular insulin total, is 442 now.  Advised him to change his pump now and take 10 units of Humulin R U-500 with a syringe.  If he is feeling worse he will need to go to the emergency room

## 2017-03-08 ENCOUNTER — Telehealth: Payer: Self-pay | Admitting: Endocrinology

## 2017-03-08 NOTE — Telephone Encounter (Signed)
Please check to see if he is having any issues with his blood sugar today and what the numbers are

## 2017-03-08 NOTE — Telephone Encounter (Signed)
Patient is having high blood pressure problems currently at 496 and is on the vgo patch Novalin R insulin 50 units it is 425 now state he has lost 8 pounds in the past week in a half.  Called [facility]

## 2017-03-08 NOTE — Telephone Encounter (Signed)
Left mess for patient to call back.  

## 2017-03-08 NOTE — Telephone Encounter (Signed)
Noted

## 2017-03-08 NOTE — Telephone Encounter (Signed)
Routed call to Dr. Dwyane Dee earlier.  Did not see Dr. Ronnie Derby telephone note on yesterday. Will retract message.

## 2017-03-08 NOTE — Telephone Encounter (Signed)
Insulin: breakfast 4 clicks; lunch 5 clicks B/S readings today: Morning: 73 Noon (Before Lunch): 182

## 2017-03-08 NOTE — Telephone Encounter (Signed)
Please update blood sugar readings and how much insulin he is taking

## 2017-03-09 NOTE — Telephone Encounter (Signed)
Called pt- no answer. Unable to leave vm. Will try again later.

## 2017-03-09 NOTE — Telephone Encounter (Signed)
He needs to check his sugars 3 times a day at least regularly If his blood sugars are staying below 100 and can reduce the number of insulin pump clicks by 2 for each meal

## 2017-03-09 NOTE — Telephone Encounter (Addendum)
I called pt- He states his CBGs on 03/08/17 were 60 before dinner, after dinner it was 70. Before bed last night, he ate a peanut butter sandwich and it went up to 89. He did c/o weakness and blurred vision on 03/08/17.   At 5:30 am today CBG was 211 and he states his sxs have resolved.  Please advise.

## 2017-03-10 NOTE — Telephone Encounter (Signed)
Pt informed of below.

## 2017-03-14 DIAGNOSIS — J9611 Chronic respiratory failure with hypoxia: Secondary | ICD-10-CM | POA: Diagnosis not present

## 2017-03-14 DIAGNOSIS — J449 Chronic obstructive pulmonary disease, unspecified: Secondary | ICD-10-CM | POA: Diagnosis not present

## 2017-03-14 DIAGNOSIS — I5032 Chronic diastolic (congestive) heart failure: Secondary | ICD-10-CM | POA: Diagnosis not present

## 2017-03-26 ENCOUNTER — Telehealth: Payer: Self-pay

## 2017-03-26 NOTE — Telephone Encounter (Signed)
Patient assistance forms received without the patient part being completed. Forms mailed to the patient to complete and return. Copy made of the forms to keep on hand as well and given to [facility].

## 2017-04-05 ENCOUNTER — Telehealth: Payer: Self-pay | Admitting: Endocrinology

## 2017-04-05 NOTE — Telephone Encounter (Signed)
Need refill for glimepiride [facility] [address], [place] [phone] (Phone) [phone] (Fax)

## 2017-04-06 ENCOUNTER — Telehealth (HOSPITAL_COMMUNITY): Payer: Self-pay | Admitting: *Deleted

## 2017-04-06 ENCOUNTER — Telehealth: Payer: Self-pay | Admitting: Endocrinology

## 2017-04-06 ENCOUNTER — Other Ambulatory Visit: Payer: Self-pay

## 2017-04-06 MED ORDER — V-GO 20 KIT: PACK | 3 refills | Status: DC

## 2017-04-06 NOTE — Telephone Encounter (Signed)
Called wife back and left detailed instructions to increase torsemide to 40 mg BID for 3 days.  Asked her to call us back if he continues to gain weight.

## 2017-04-06 NOTE — Telephone Encounter (Signed)
Advanced Heart Failure Triage Encounter  Patient Name: Joshua [name]  Date of Call: 04/06/17  Problem:  Patient's wife called to report 9 lb wt gain since 3/24.  Patient weight was 225.4 on 3/24 and on 4/1 it was 234 lbs.  Patient's weight in clinic on 2/20 was 233. Patient has a little swelling in lower extremities and no shortness of breath.   Plan:  Patient takes 40 mg of torsemide daily.  Will forward to Barrington Ellison, PA to review.   Darron Doom, RN

## 2017-04-06 NOTE — Telephone Encounter (Signed)
This has been done.

## 2017-04-06 NOTE — Telephone Encounter (Signed)
Can take torsemide 40 mg BID for 3 days.     Legrand Como [address] [place], PA-C 04/06/2017 1:06 PM

## 2017-04-06 NOTE — Telephone Encounter (Signed)
Patient needs RX (2nd request) for [employer] Kit sent to [facility] in [place] ph# [phone] asap. Pharmacy states they still do not have the RX.

## 2017-04-07 ENCOUNTER — Other Ambulatory Visit: Payer: Self-pay

## 2017-04-07 NOTE — Telephone Encounter (Signed)
I have reviewed last 2 office notes and several phone encounters and do not see a prescription for glimepiride please advise

## 2017-04-07 NOTE — Telephone Encounter (Signed)
This has been discontinued

## 2017-04-14 DIAGNOSIS — J9611 Chronic respiratory failure with hypoxia: Secondary | ICD-10-CM | POA: Diagnosis not present

## 2017-04-14 DIAGNOSIS — I5032 Chronic diastolic (congestive) heart failure: Secondary | ICD-10-CM | POA: Diagnosis not present

## 2017-04-14 DIAGNOSIS — J449 Chronic obstructive pulmonary disease, unspecified: Secondary | ICD-10-CM | POA: Diagnosis not present

## 2017-04-14 NOTE — Progress Notes (Signed)
Patient had appointment today for definitive and final diabetic shoe fitting and delivery.  Patient was seen by Benjie Karvonen, C.Ped, O[facility].   The inserts fit well and accomplished full contact with the plantar surface of the foot bilateral; the shoes fit well and offered forefoot freedom, no noticible heel slippage, and good toe clearance w/ the insert in place.  Patient was advised to monitor of any skin irritation, breakdown.  Patient was satisfied with fit and function.

## 2017-04-19 ENCOUNTER — Ambulatory Visit (INDEPENDENT_AMBULATORY_CARE_PROVIDER_SITE_OTHER): Payer: Medicare Other | Admitting: Family Medicine

## 2017-04-19 ENCOUNTER — Encounter: Payer: Self-pay | Admitting: Family Medicine

## 2017-04-19 VITALS — BP 120/50 | HR 58 | Temp 97.9°F | Resp 16 | Ht 71.0 in | Wt 231.0 lb

## 2017-04-19 DIAGNOSIS — J449 Chronic obstructive pulmonary disease, unspecified: Secondary | ICD-10-CM

## 2017-04-19 DIAGNOSIS — Z862 Personal history of diseases of the blood and blood-forming organs and certain disorders involving the immune mechanism: Secondary | ICD-10-CM

## 2017-04-19 DIAGNOSIS — I10 Essential (primary) hypertension: Secondary | ICD-10-CM

## 2017-04-19 DIAGNOSIS — N183 Chronic kidney disease, stage 3 unspecified: Secondary | ICD-10-CM

## 2017-04-19 DIAGNOSIS — D649 Anemia, unspecified: Secondary | ICD-10-CM | POA: Diagnosis not present

## 2017-04-19 DIAGNOSIS — F330 Major depressive disorder, recurrent, mild: Secondary | ICD-10-CM

## 2017-04-19 LAB — BASIC METABOLIC PANEL
BUN: 25 mg/dL — AB (ref 6–23)
CO2: 28 mEq/L (ref 19–32)
Calcium: 9.4 mg/dL (ref 8.4–10.5)
Chloride: 104 mEq/L (ref 96–112)
Creatinine, Ser: 0.87 mg/dL (ref 0.40–1.50)
GFR: 90.2 mL/min (ref >60.00)
Glucose, Bld: 198 mg/dL — ABNORMAL HIGH (ref 70–99)
POTASSIUM: 5.2 meq/L — AB (ref 3.5–5.1)
SODIUM: 142 meq/L (ref 135–145)

## 2017-04-19 LAB — CBC
HCT: 45 % (ref 39.0–52.0)
HEMOGLOBIN: 14.2 g/dL (ref 13.0–17.0)
MCHC: 31.6 g/dL (ref 30.0–36.0)
MCV: 84 fl (ref 78.0–100.0)
Platelets: 173 10*3/uL (ref 150.0–400.0)
RBC: 5.37 Mil/uL (ref 4.22–5.81)
RDW: 18.7 % — AB (ref 11.5–15.5)
WBC: 8.3 10*3/uL (ref 4.0–10.5)

## 2017-04-19 NOTE — Patient Instructions (Signed)
We made no changes in your medications today.  We'll call you with your lab results and any further recommendations.  Happy birthday!

## 2017-04-19 NOTE — Progress Notes (Signed)
OFFICE VISIT  04/19/2017   CC:  Chief Complaint  Patient presents with  . Follow-up    RCI, pt is fasting.      HPI:    Patient is a 77 y.o. Caucasian male who presents for 4 mo f/u depression, COPD, HTN, CRI with GFR in the 56s. He has ischemic CM and PAF, followed by advanced HF clinic.  DM mgmt per Dr. Dwyane Dee in endo.   HTN: compliant with meds.  Occ home monitoring shows <140/90.  Dep: 4 mo ago I added a small dose of sertraline to his remeron 45 mg qd. He reports no signif change for the better, also denies feeling any worse--wife says "pretty stable".  No SI or HI.  He is pretty much overwhelmed with all his chronic med problems and the limitations they have put on his life.    COPD: breathing stable.  States he is not taking symbicort--taking only albuterol prn.  Says he has not used this in several days.  GFR: avoids NSAIDs best he can.  Uses tylenol as first line pain med. Given his CHF, fluid balance is difficult.  ROS: no SOB, no CP, no dizziness, no palpitations.  Past Medical History:  Diagnosis Date  . Acute respiratory distress 06/04/2016  . Adenomatous colon polyp 10/16/2011   Repeat 2018  . Arthritis     Hips, R>L & KNEES  . CAD, multiple vessel    3V CAD cath 08/08/13----CABG done shortly after.  Cath 06/2016: clear bipasses/no interventional lesion.  . 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 pylori neg, no dysplasia or malignancy.  . Chronic combined systolic and diastolic CHF, NYHA class 2 ([place]) 2016-2018   Followed by Advanced CHF clinic.  Transesoph Echo 09/2016 EF improved to 40-45%; diffuse hypokinesis, Grd I DD.  [name] Chronic renal insufficiency, stage 3 (moderate) (HCC) 2018   GFR 50s  . COPD (chronic obstructive pulmonary disease) ([place])    GOLD II.  Spirometry  2004 borderline obstruction; 2015 mod obst: noncompliant with bid symbicort so pulmonologist switched him to a once daily inhaler: Trelegy ellipta 12/2015.   Oxygen prn: goal 88-92%.  . Diabetic nephropathy ([place])    Elevated urine microalb/cr 03/2011  . DM type 2 (diabetes mellitus, type 2) ([place])    Poor control on max oral meds--pt eventually agreed to insulin therapy.  As of 2017 his DM is being managed by Dr. Dwyane Dee in endo.  . DOE (dyspnea on exertion)    COPD + chronic diastolic HF  . Dysthymia    Worsened after 2015 heart surgery.  Memory loss +? as well.  Neuro eval 01/2017--plan is to get MRI brain, B12 level, and neuropsychiatric testing.  . Erectile dysfunction    Normal testosterone  . Hearing loss of both ears 2016   Hearing aids  . Hyperlipidemia   . Hyperplastic colon polyp 2001   No further colonoscopies to be done as of 11/2016 GI eval.  . Hypertension   . IBS (irritable bowel syndrome)    responds well to levsin  . 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  . Ischemic cardiomyopathy 06/2016; 09/2016   06/2016: EF 25-30%, global hypokinesis, grd II DD, vent septum motion changes c/w LBBB, mod MV regurg, L atr mod/sev dilat, R atr mod dilat, mod incr pulm press, Grd II DD.  09/2016--EF 40-45%; diffuse hypokinesis, Grd I DD.  .[name]  Macular degeneration, dry    Mild, bilat (Optometrist, Mayford Knife at [facility] [facility] in [place], [place])  . Memory impairment   . Obesity   . Open toe wound 11/2015   Wound care clinic appt made and then canceled when toe improved.  . Other and unspecified angina pectoris   . PAD (peripheral artery disease) ([place]) 02/2016   Abnormal ABI's and waveforms: LE arterial duplex ordered for f/u as per cardiologist's recommendation.  Vasc eval by Dr. Bridgett Larsson: impression was minimal PAD, recommended maximize med mgmt.  [name] PAF (paroxysmal atrial fibrillation) ([place]) 10/2014   xarelto  . Pericardial effusion with cardiac tamponade 6//29/15   pericardiocentesis was done,  Infectious/inflamm (cytology showed NO  MALIGNANT CELLS)  . Pleural plaque    Pleural plaques/asbestosis changes on CT chest done by pulm 05/2016.  [name] Pneumonia   . Tobacco dependence in remission    100+ pack-yr hx: quit after CABG    Past Surgical History:  Procedure Laterality Date  . APPENDECTOMY  1957  . CARDIAC CATHETERIZATION  08/08/2013  . CATARACT EXTRACTION W/ INTRAOCULAR LENS  IMPLANT, BILATERAL  04/08/2006 & 04/22/2006  . CATARACT EXTRACTION W/ INTRAOCULAR LENS  IMPLANT, BILATERAL Bilateral   . CHOLECYSTECTOMY OPEN  1999  . COLONOSCOPY  10/16/2011   Procedure: COLONOSCOPY;  Surgeon: Inda Castle, MD;  Location: [facility];  Service: Endoscopy;  Laterality: N/A;  . CORONARY ARTERY BYPASS GRAFT N/A 08/14/2013   Procedure: CORONARY ARTERY BYPASS GRAFTING (CABG);  Surgeon: Melrose Nakayama, MD;  Location: [facility];  Service: Open Heart Surgery;  Laterality: N/A;  Times 4   using left internal mammary artery and endoscopically harvested bilateral saphenous vein  . ESOPHAGOGASTRODUODENOSCOPY  02/25/12   Atrophic gastritis with a few erosions--capsule endoscopy planned as of 02/25/12 (Dr. Deatra Ina).  [name] HARDWARE REMOVAL Right 08/12/2012   Procedure: HARDWARE REMOVAL;  Surgeon: Mcarthur Rossetti, MD;  Location: [facility];  Service: Orthopedics;  Laterality: Right;  . HIP SURGERY Right 1954   Repair of slipped capital femoral epiphysis.  . INTRAOPERATIVE TRANSESOPHAGEAL ECHOCARDIOGRAM N/A 08/14/2013   Normal LV function. Procedure: INTRAOPERATIVE TRANSESOPHAGEAL ECHOCARDIOGRAM;  Surgeon: Melrose Nakayama, MD;  Location: [facility];  Service: Open Heart Surgery;  Laterality: N/A;  . LEFT HEART CATHETERIZATION WITH CORONARY ANGIOGRAM N/A 08/08/2013   Procedure: LEFT HEART CATHETERIZATION WITH CORONARY ANGIOGRAM;  Surgeon: Jettie Booze, MD;  Location: [facility] CATH LAB;  Service: Cardiovascular;  Laterality: N/A;  . PATELLA FRACTURE SURGERY Left ~ 1979   bolt + 3 screws to repair tib plateau fx  . PERICARDIAL TAP N/A 07/01/2013    Procedure: PERICARDIAL TAP;  Surgeon: Jettie Booze, MD;  Location: [facility] CATH LAB;  Service: Cardiovascular;  Laterality: N/A;  . RIGHT/LEFT HEART CATH AND CORONARY ANGIOGRAPHY N/A 06/10/2016   Procedure: Right/Left Heart Cath and Coronary Angiography;  Surgeon: Larey Dresser, MD;  Location: [facility];  Service: Cardiovascular;  Laterality: N/A;  . TESTICLE SURGERY  as a child   Undescended testicle brought down into scrotum  . TONSILLECTOMY  1947  . TOTAL HIP ARTHROPLASTY Right 08/12/2012   Procedure: REMOVAL OF OLD PINS RIGHT HIP AND RIGHT TOTAL HIP ARTHROPLASTY ANTERIOR APPROACH;  Surgeon: Mcarthur Rossetti, MD;  Location: [facility];  Service: Orthopedics;  Laterality: Right;  . TRANSESOPHAGEAL ECHOCARDIOGRAM  09/22/2016   EF 40-45%; diffuse hypokinesis, Grd I DD.  [name] TRANSTHORACIC ECHOCARDIOGRAM  10/30/14   Mod LVH, EF 60-65%, normal wall motion, mod mitral regurg, mild PAH  .  TRANSTHORACIC ECHOCARDIOGRAM  06/2016   06/2016: EF 25-30%, global hypokinesis, grd II DD, vent septum motion changes c/w LBBB, mod MV regurg, L atr mod/sev dilat, R atr mod dilat, mod incr pulm press, Grd II DD.    Outpatient Medications Prior to Visit  Medication Sig Dispense Refill  . ACCU-CHEK GUIDE test strip USE TO CHECK BLOOD SUGAR 3 TIMES DAILY 100 each 11  . acetaminophen (TYLENOL) 325 MG tablet Take 2 tablets (650 mg total) by mouth every 4 (four) hours as needed for headache or mild pain.    . Ascorbic Acid (VITAMIN C) 1000 MG tablet Take 1,000 mg by mouth daily.    [name] atorvastatin (LIPITOR) 40 MG tablet TAKE ONE TABLET BY MOUTH EVERY DAY 90 tablet 1  . bisoprolol (ZEBETA) 5 MG tablet Take 1.5 tablets (7.5 mg total) by mouth daily. 45 tablet 6  . Cholecalciferol (VITAMIN D-3) 1000 units CAPS Take 1,000 Units by mouth daily. Reported on 02/26/2015    . DIGOX 125 MCG tablet TAKE ONE TABLET BY MOUTH EVERY DAY 30 tablet 6  . fluticasone (FLONASE) 50 MCG/ACT nasal spray Place 2 sprays into both  nostrils daily. 16 g 1  . gabapentin (NEURONTIN) 100 MG capsule Take 200 mg by mouth 3 (three) times daily.    [name] guaiFENesin (MUCINEX) 600 MG 12 hr tablet Take 2 tablets (1,200 mg total) by mouth 2 (two) times daily. 30 tablet 0  . hyoscyamine (LEVSIN SL) 0.125 MG SL tablet 1-2 tabs po q6h prn diarrhea 60 tablet 3  . Insulin Disposable Pump (V-GO 20) KIT Apply one Vgo 20 pump daily. 30 kit 3  . insulin regular human CONCENTRATED (HUMULIN R) 500 UNIT/ML injection Use 56 units daily in insulin pump 20 mL 3  . Insulin Syringe-Needle U-100 30G X 1/2" 1 ML MISC Use to fill insulin VGo pump one time daily 100 each 2  . isosorbide mononitrate (IMDUR) 30 MG 24 hr tablet TAKE ONE TABLET BY MOUTH EVERY DAY 90 tablet 0  . losartan (COZAAR) 25 MG tablet Take 1 tablet (25 mg total) 2 (two) times daily with a meal by mouth. 60 tablet 3  . mirtazapine (REMERON) 45 MG tablet Take 1 tablet (45 mg total) by mouth at bedtime. 30 tablet 5  . Multiple Vitamin (MULTIVITAMIN WITH MINERALS) TABS Take 1 tablet by mouth daily.    . nitroGLYCERIN (NITROSTAT) 0.4 MG SL tablet PLACE 1 TABLET UNDER THE TONGUE EVERY 5 MINUTES AS NEEDED FOR CHEST PAIN 25 tablet 3  . OXYGEN Inhale 2 L into the lungs.    [name] PROAIR HFA 108 (90 Base) MCG/ACT inhaler INHALE 2 PUFFS INTO THE LUNGS EVERY 4 HOURS AS NEEDED FOR WHEEZING ORSHORTNESS OF BREATH 8.5 g 1  . rivaroxaban (XARELTO) 20 MG TABS tablet Take 1 tablet (20 mg total) by mouth daily with supper. 90 tablet 3  . sertraline (ZOLOFT) 25 MG tablet TAKE TWO TABLETS BY MOUTH EVERY DAY 60 tablet 3  . spironolactone (ALDACTONE) 25 MG tablet TAKE ONE TABLET BY MOUTH EVERY DAY 30 tablet 6  . SYMBICORT 160-4.5 MCG/ACT inhaler INHALE 2 PUFFS INTO THE LUNGS TWICE DAILY 10.2 g 5  . SYNJARDY 05-998 MG TABS TAKE ONE TABLET BY MOUTH TWICE DAILY AFTER A MEAL 60 tablet 2  . tamsulosin (FLOMAX) 0.4 MG CAPS capsule TAKE 1 CAPSULE BY MOUTH EVERY DAY AFTER SUPPER 90 capsule 1  . torsemide (DEMADEX) 20 MG  tablet TAKE TWO TABLETS BY MOUTH EVERY DAY 60 tablet 6  .  Insulin Glargine (TOUJEO MAX SOLOSTAR) 300 UNIT/ML SOPN Inject 50 Units into the skin 2 (two) times daily.    [name] torsemide (DEMADEX) 20 MG tablet Take 40 mg by mouth daily. Takes 2 tablet daily     No facility-administered medications prior to visit.     Allergies  Allergen Reactions  . Morphine And Related Other (See Comments)    Drenched with perspiration  . Entresto [Sacubitril-Valsartan]     hypotension  . Demerol [Meperidine] Nausea Only  . Starlix [Nateglinide] Other (See Comments)    gassy    ROS As per HPI  PE: Initial bp today 151/58, repeat 120/50. Blood pressure (!) 120/50, pulse (!) 58, temperature 97.9 F (36.6 C), temperature source Oral, resp. rate 16, height 5' 11" (1.803 m), weight 231 lb (104.8 kg), SpO2 95 %. Gen: Alert, well appearing.  Patient is oriented to person, place, time, and situation. AFFECT: pleasant, lucid thought and speech. CV: RRR, no m/r/g.   LUNGS: CTA bilat, nonlabored resps, good aeration in all lung fields. EXT: no clubbing, cyanosis, or edema.    LABS:  Lab Results  Component Value Date   TSH 1.612 08/28/2016   Lab Results  Component Value Date   WBC 7.5 08/28/2016   HGB 12.8 (L) 08/28/2016   HCT 43.1 08/28/2016   MCV 84.2 08/28/2016   PLT 175 08/28/2016   Lab Results  Component Value Date   IRON 71 05/18/2016   IRON 76 05/18/2016   TIBC 406 05/18/2016   FERRITIN 18.1 (L) 05/18/2016    Lab Results  Component Value Date   CREATININE 1.08 02/23/2017   BUN 24 (H) 02/23/2017   NA 142 02/23/2017   K 4.4 02/23/2017   CL 105 02/23/2017   CO2 23 02/23/2017   Lab Results  Component Value Date   ALT 21 01/11/2017   AST 21 01/11/2017   ALKPHOS 81 08/27/2016   BILITOT 0.3 01/11/2017   Lab Results  Component Value Date   CHOL 112 02/23/2017   Lab Results  Component Value Date   HDL 31 (L) 02/23/2017   Lab Results  Component Value Date   LDLCALC 5  02/23/2017   Lab Results  Component Value Date   TRIG 382 (H) 02/23/2017   Lab Results  Component Value Date   CHOLHDL 3.6 02/23/2017   Lab Results  Component Value Date   HGBA1C 9.5 01/12/2017    IMPRESSION AND PLAN:  1) 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  Due for CBC today.  2) Depression: no signif change over last 4 mo. He and wife are wanting to just keep meds same at this time and I agree. Remeron 435mqd and sertraline 5058mhs.  3) HTN: The current medical regimen is effective;  continue present plan and medications. BMET today.  4) CRI with GFR 50s: emphasized need to avoid NSAIDs for renal reasons and b/c he is on xarelto. Lytes/cr today.  5) COPD: stable.  Encouraged symbicort use qd but he finds it troublesome to keep up with this.  An After Visit Summary was printed and given to the patient.  FOLLOW UP: Return in about 4 months (around 08/19/2017) for routine chronic illness f/u.  Signed:  PhiCrissie SicklesD           04/19/2017

## 2017-04-20 ENCOUNTER — Other Ambulatory Visit: Payer: Self-pay

## 2017-04-20 DIAGNOSIS — E875 Hyperkalemia: Secondary | ICD-10-CM

## 2017-04-25 NOTE — Progress Notes (Signed)
Patient ID: Joshua [name], male   DOB: October 16, 1940, 77 y.o.   MRN: [id number]            Reason for Appointment:  Follow-up for Type 2 Diabetes  Referring physician: [place]   History of Present Illness:          Date of diagnosis of type 2 diabetes mellitus: ?  2002        Background history:   He apparently had been treated with metformin initially which was continued until about 2016. He previously has also been tried on Amaryl, Actos, Farxiga and Victoza but he claims that these did not help his blood sugar and were stopped Appears to have been on insulin since about 2014 but he does not remember details. He has been taking mostly Levemir insulin as a basal insulin but was also on Lantus in 2015 for some time His best A1c has been 7.3, once in 2014 and another time in 2016, otherwise his A1c has been higher and as high as 10.8  Recent history:   INSULIN regimen U-500 insulin with V-go 20 units basal, bolus clicks: 8-5-4 at meals    Non-insulin hypoglycemic drugs the patient is taking are: Synjardy 05/998 twice a day breakfast and suppertime  His last A1c was 9.5 in January and is now 8.4   Current management, blood sugar patterns and problems identified:  He was advised to check blood sugars meals again on his last visit but he keeps forgetting to do so  FASTING blood sugars are overall higher compared to his last visit he still has a couple of low sugars in the mornings  Appears that he is sometimes forgetting to bolus before meals although otherwise taking the BOLUS clicks just before eating  He has only one reading after evening meal which was over 200  He says he had a couple of V-go pumps that did not work and blood sugar went up to 400  Also he has sporadic high readings in the mornings otherwise also and not clear why these go up  No hypoglycemia in the last few days, last hypoglycemic episode was in 04/06/17 early morning  He was told to have a protein snack at  bedtime but he is mostly having a regular boost drink with some crackers  He is still not checking his blood sugars after meals recently and mostly in the mornings    Side effects from medications have been: None  Compliance with the medical regimen: Fair Hypoglycemia: Never    Glucose monitoring:  done 2 times a day         Glucometer:  Accu-Chek .      Blood Glucose readings with averages from download   Mean values apply above for all meters except median for One Touch  PRE-MEAL Fasting Lunch Dinner Bedtime Overall  Glucose range:  56-325      Mean/median:  160     190   POST-MEAL PC Breakfast PC Lunch PC Dinner  Glucose range:   225  234  Mean/median:        Self-care: The diet that the patient has been following is: tries to limit Sweet drinks .     Meal times are:  Breakfast is at 7 a.m.  Dinner: 5-6 PM    Typical meal intake: Breakfast is usually eggs,  Sausage, oatmeal or grits.  Lunch is a sandwich, any evening he has meat and vegetables, For snacks he will have fruit or crackers Boost  at bedtime                Dietician visit, most recent: 01/09/16 CDE visit: 12/18               Exercise: Unable to do any     Weight history:  Wt Readings from Last 3 Encounters:  04/26/17 233 lb 6.4 oz (105.9 kg)  04/19/17 231 lb (104.8 kg)  02/24/17 233 lb 12.8 oz (106.1 kg)    Glycemic control:   Lab Results  Component Value Date   HGBA1C 8.4 04/26/2017   HGBA1C 9.5 01/12/2017   HGBA1C 9.3 08/20/2016   Lab Results  Component Value Date   MICROALBUR 1.4 07/07/2016   [place] 5 02/23/2017   CREATININE 0.87 04/19/2017   Lab Results  Component Value Date   MICRALBCREAT 1.2 07/07/2016    Other active problems: See review of systems   Allergies as of 04/26/2017      Reactions   Morphine And Related Other (See Comments)   Drenched with perspiration   Entresto [sacubitril-valsartan]    hypotension   Demerol [meperidine] Nausea Only   Starlix [nateglinide] Other  (See Comments)   gassy      Medication List        Accurate as of 04/26/17 11:38 AM. Always use your most recent med list.          ACCU-CHEK GUIDE test strip Generic drug:  glucose blood USE TO CHECK BLOOD SUGAR 3 TIMES DAILY   acetaminophen 325 MG tablet Commonly known as:  TYLENOL Take 2 tablets (650 mg total) by mouth every 4 (four) hours as needed for headache or mild pain.   atorvastatin 40 MG tablet Commonly known as:  LIPITOR TAKE ONE TABLET BY MOUTH EVERY DAY   bisoprolol 5 MG tablet Commonly known as:  ZEBETA Take 1.5 tablets (7.5 mg total) by mouth daily.   DIGOX 0.125 MG tablet Generic drug:  digoxin TAKE ONE TABLET BY MOUTH EVERY DAY   fluticasone 50 MCG/ACT nasal spray Commonly known as:  FLONASE Place 2 sprays into both nostrils daily.   gabapentin 100 MG capsule Commonly known as:  NEURONTIN Take 200 mg by mouth 3 (three) times daily.   guaiFENesin 600 MG 12 hr tablet Commonly known as:  MUCINEX Take 2 tablets (1,200 mg total) by mouth 2 (two) times daily.   hyoscyamine 0.125 MG SL tablet Commonly known as:  LEVSIN SL 1-2 tabs po q6h prn diarrhea   insulin regular human CONCENTRATED 500 UNIT/ML injection Commonly known as:  HUMULIN R Use 56 units daily in insulin pump   Insulin Syringe-Needle U-100 30G X 1/2" 1 ML Misc Use to fill insulin VGo pump one time daily   isosorbide mononitrate 30 MG 24 hr tablet Commonly known as:  IMDUR TAKE ONE TABLET BY MOUTH EVERY DAY   losartan 25 MG tablet Commonly known as:  COZAAR Take 1 tablet (25 mg total) 2 (two) times daily with a meal by mouth.   mirtazapine 45 MG tablet Commonly known as:  REMERON Take 1 tablet (45 mg total) by mouth at bedtime.   multivitamin with minerals Tabs tablet Take 1 tablet by mouth daily.   nitroGLYCERIN 0.4 MG SL tablet Commonly known as:  NITROSTAT PLACE 1 TABLET UNDER THE TONGUE EVERY 5 MINUTES AS NEEDED FOR CHEST PAIN   OXYGEN Inhale 2 L into the lungs.     PROAIR HFA 108 (90 Base) MCG/ACT inhaler Generic drug:  albuterol INHALE 2 PUFFS  INTO THE LUNGS EVERY 4 HOURS AS NEEDED FOR WHEEZING ORSHORTNESS OF BREATH   rivaroxaban 20 MG Tabs tablet Commonly known as:  XARELTO Take 1 tablet (20 mg total) by mouth daily with supper.   sertraline 25 MG tablet Commonly known as:  ZOLOFT TAKE TWO TABLETS BY MOUTH EVERY DAY   spironolactone 25 MG tablet Commonly known as:  ALDACTONE TAKE ONE TABLET BY MOUTH EVERY DAY   SYMBICORT 160-4.5 MCG/ACT inhaler Generic drug:  budesonide-formoterol INHALE 2 PUFFS INTO THE LUNGS TWICE DAILY   SYNJARDY 05-998 MG Tabs Generic drug:  Empagliflozin-metFORMIN HCl TAKE ONE TABLET BY MOUTH TWICE DAILY AFTER A MEAL   tamsulosin 0.4 MG Caps capsule Commonly known as:  FLOMAX TAKE 1 CAPSULE BY MOUTH EVERY DAY AFTER SUPPER   torsemide 20 MG tablet Commonly known as:  DEMADEX TAKE TWO TABLETS BY MOUTH EVERY DAY   V-GO 20 Kit Apply one Vgo 20 pump daily.   vitamin C 1000 MG tablet Take 1,000 mg by mouth daily.   Vitamin D-3 1000 units Caps Take 1,000 Units by mouth daily. Reported on 02/26/2015       Allergies:  Allergies  Allergen Reactions  . Morphine And Related Other (See Comments)    Drenched with perspiration  . Entresto [Sacubitril-Valsartan]     hypotension  . Demerol [Meperidine] Nausea Only  . Starlix [Nateglinide] Other (See Comments)    gassy    Past Medical History:  Diagnosis Date  . Acute respiratory distress 06/04/2016  . Adenomatous colon polyp 10/16/2011   Repeat 2018  . Arthritis     Hips, R>L & KNEES  . CAD, multiple vessel    3V CAD cath 08/08/13----CABG done shortly after.  Cath 06/2016: clear bipasses/no interventional lesion.  . 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 pylori neg, no dysplasia or malignancy.  . Chronic combined systolic and diastolic CHF, NYHA class 2 ([place]) 2016-2018   Followed by [facility].  Transesoph  Echo 09/2016 EF improved to 40-45%; diffuse hypokinesis, Grd I DD.  [name] Chronic renal insufficiency, stage 3 (moderate) (H[facility]) 2018   GFR 50s  . COPD (chronic obstructive pulmonary disease) ([place])    GOLD II.  Spirometry  2004 borderline obstruction; 2015 mod obst: noncompliant with bid symbicort so pulmonologist switched him to a once daily inhaler: Trelegy ellipta 12/2015.  Oxygen prn: goal 88-92%.  . Diabetic nephropathy ([place])    Elevated urine microalb/cr 03/2011  . DM type 2 (diabetes mellitus, type 2) ([place])    Poor control on max oral meds--pt eventually agreed to insulin therapy.  As of 2017 his DM is being managed by Dr. Dwyane Dee in endo.  . DOE (dyspnea on exertion)    COPD + chronic diastolic HF  . Dysthymia    Worsened after 2015 heart surgery.  Memory loss +? as well.  Neuro eval 01/2017--plan is to get MRI brain, B12 level, and neuropsychiatric testing.  . Erectile dysfunction    Normal testosterone  . Hearing loss of both ears 2016   Hearing aids  . Hyperlipidemia   . Hyperplastic colon polyp 2001   No further colonoscopies to be done as of 11/2016 GI eval.  . Hypertension   . IBS (irritable bowel syndrome)    responds well to levsin  . Iron deficiency anemia 2014   03/2012 capsule endoscopy showed 2 AVMs--likely responsible for his IDA--lifetime iron supp recommended + q63moCBCs.  . Ischemic cardiomyopathy 06/2016; 09/2016   06/2016: EF  25-30%, global hypokinesis, grd II DD, vent septum motion changes c/w LBBB, mod MV regurg, L atr mod/sev dilat, R atr mod dilat, mod incr pulm press, Grd II DD.  09/2016--EF 40-45%; diffuse hypokinesis, Grd I DD.  . Macular degeneration, dry    Mild, bilat (Optometrist, Mayford Knife at [facility] in [place], [place])  . Memory impairment   . Obesity   . Open toe wound 11/2015   Wound care clinic appt made and then canceled when toe improved.  . Other and unspecified angina pectoris   . PAD (peripheral artery disease) ([place]) 02/2016    Abnormal ABI's and waveforms: LE arterial duplex ordered for f/u as per cardiologist's recommendation.  Vasc eval by Dr. Bridgett Larsson: impression was minimal PAD, recommended maximize med mgmt.  [name] PAF (paroxysmal atrial fibrillation) ([place]) 10/2014   xarelto  . Pericardial effusion with cardiac tamponade 6//29/15   pericardiocentesis was done,  Infectious/inflamm (cytology showed NO MALIGNANT CELLS)  . Pleural plaque    Pleural plaques/asbestosis changes on CT chest done by pulm 05/2016.  [name] Pneumonia   . Tobacco dependence in remission    100+ pack-yr hx: quit after CABG    Past Surgical History:  Procedure Laterality Date  . APPENDECTOMY  1957  . CARDIAC CATHETERIZATION  08/08/2013  . CATARACT EXTRACTION W/ INTRAOCULAR LENS  IMPLANT, BILATERAL  04/08/2006 & 04/22/2006  . CATARACT EXTRACTION W/ INTRAOCULAR LENS  IMPLANT, BILATERAL Bilateral   . CHOLECYSTECTOMY OPEN  1999  . COLONOSCOPY  10/16/2011   Procedure: COLONOSCOPY;  Surgeon: Inda Castle, MD;  Location: [facility];  Service: Endoscopy;  Laterality: N/A;  . CORONARY ARTERY BYPASS GRAFT N/A 08/14/2013   Procedure: CORONARY ARTERY BYPASS GRAFTING (CABG);  Surgeon: Melrose Nakayama, MD;  Location: [facility];  Service: Open Heart Surgery;  Laterality: N/A;  Times 4   using left internal mammary artery and endoscopically harvested bilateral saphenous vein  . ESOPHAGOGASTRODUODENOSCOPY  02/25/12   Atrophic gastritis with a few erosions--capsule endoscopy planned as of 02/25/12 (Dr. Deatra Ina).  [name] HARDWARE REMOVAL Right 08/12/2012   Procedure: HARDWARE REMOVAL;  Surgeon: Mcarthur Rossetti, MD;  Location: [facility];  Service: Orthopedics;  Laterality: Right;  . HIP SURGERY Right 1954   Repair of slipped capital femoral epiphysis.  . INTRAOPERATIVE TRANSESOPHAGEAL ECHOCARDIOGRAM N/A 08/14/2013   Normal LV function. Procedure: INTRAOPERATIVE TRANSESOPHAGEAL ECHOCARDIOGRAM;  Surgeon: Melrose Nakayama, MD;  Location: [facility];  Service: Open Heart Surgery;   Laterality: N/A;  . LEFT HEART CATHETERIZATION WITH CORONARY ANGIOGRAM N/A 08/08/2013   Procedure: LEFT HEART CATHETERIZATION WITH CORONARY ANGIOGRAM;  Surgeon: Jettie Booze, MD;  Location: [facility] CATH LAB;  Service: Cardiovascular;  Laterality: N/A;  . PATELLA FRACTURE SURGERY Left ~ 1979   bolt + 3 screws to repair tib plateau fx  . PERICARDIAL TAP N/A 07/01/2013   Procedure: PERICARDIAL TAP;  Surgeon: Jettie Booze, MD;  Location: [facility] CATH LAB;  Service: Cardiovascular;  Laterality: N/A;  . RIGHT/LEFT HEART CATH AND CORONARY ANGIOGRAPHY N/A 06/10/2016   Procedure: Right/Left Heart Cath and Coronary Angiography;  Surgeon: Larey Dresser, MD;  Location: [facility] CV LAB;  Service: Cardiovascular;  Laterality: N/A;  . TESTICLE SURGERY  as a child   Undescended testicle brought down into scrotum  . TONSILLECTOMY  1947  . TOTAL HIP ARTHROPLASTY Right 08/12/2012   Procedure: REMOVAL OF OLD PINS RIGHT HIP AND RIGHT TOTAL HIP ARTHROPLASTY ANTERIOR APPROACH;  Surgeon: Mcarthur Rossetti, MD;  Location: [facility];  Service: Orthopedics;  Laterality: Right;  . TRANSESOPHAGEAL ECHOCARDIOGRAM  09/22/2016   EF 40-45%; diffuse hypokinesis, Grd I DD.  [name] TRANSTHORACIC ECHOCARDIOGRAM  10/30/14   Mod LVH, EF 60-65%, normal wall motion, mod mitral regurg, mild PAH  . TRANSTHORACIC ECHOCARDIOGRAM  06/2016   06/2016: EF 25-30%, global hypokinesis, grd II DD, vent septum motion changes c/w LBBB, mod MV regurg, L atr mod/sev dilat, R atr mod dilat, mod incr pulm press, Grd II DD.    Family History  Problem Relation Age of Onset  . Heart disease Father   . Heart attack Father   . CVA Mother   . Hypertension Mother   . Diabetes Paternal Grandmother   . Breast cancer Sister   . Diabetes Maternal Uncle   . Heart disease Maternal Uncle   . Stroke Neg Hx     Social History:  reports that he quit smoking about 18 months ago. His smoking use included cigarettes. He has a 15.50 pack-year smoking history.  He quit smokeless tobacco use about 3 years ago. He reports that he does not drink alcohol or use drugs.   Review of Systems   Lipid history: He had been prescribed Lipitor 40 mg Has had good control of LDL but he has increased triglycerides Not clear Vascepa is covered by his insurance    Lab Results  Component Value Date   CHOL 112 02/23/2017   HDL 31 (L) 02/23/2017   LDLCALC 5 02/23/2017   LDLDIRECT 187.0 11/21/2015   TRIG 382 (H) 02/23/2017   CHOLHDL 3.6 02/23/2017           Hypertension:Mild and controlled with bisoprolol 5 mg,25 mg losartan He is on diuretics for CHF  Followed by cardiologist His last potassium was high and he was told to have it repeated by PCP   BP Readings from Last 3 Encounters:  04/26/17 126/70  04/19/17 (!) 120/50  02/24/17 128/60     Most recent eye exam was 02/2017, has had no retinopathy  Most recent foot exam: 12/17   LABS:  Office Visit on 04/26/2017  Component Date Value Ref Range Status  . Hemoglobin A1C 04/26/2017 8.4   Final    Physical Examination:  BP 126/70 (BP Location: Left Arm, Patient Position: Sitting, Cuff Size: Normal)   Pulse 60   Ht _0  (1.803 m)   Wt 233 lb 6.4 oz (105.9 kg)   SpO2 95%   BMI 32.55 kg/m       ASSESSMENT:  Diabetes type 2, uncontrolled on insulin    See history of present illness for detailed discussion of current diabetes management, blood sugar patterns and problems identified  A1c is better but still high at 8.4  With his not taking his blood sugars after meals difficult to determine how consistently high his blood sugars are after meals also not clear why his fasting readings are variable His compliance with mealtime insulin is also not consistent does not bolus 30 minutes before eating as directed Because of variability in his diet he may have variable postprandial readings but he is not able to understand adjusting the mealtime dose based on carbohydrates or meal size He  thinks that some of his very high readings are related to malfunction of his V-go pump  Hypoglycemia has been minimal and not recently, mostly early morning  LIPIDS: Has history of high triglycerides and will prescribe Vascepa since this is still not on his list Will recheck his triglycerides today since he is fasting  PLAN:    He will check blood sugars at bedtime when he takes his medications We will try to avoid high carbohydrate drinks such as boost and have only a protein snack at bedtime If his blood sugars are consistently low normal at bedtime he will use 5 clicks at dinnertime and not 6 Try to bolus 30 minutes before eating Consider consultation with dietitian again Call us if he is having excessive hypoglycemia Sugar targets at various times were discussed He will change his Synjardy timing to lunch and breakfast and not take it at dinnertime  Patient Instructions  Please try to check your sugar at bedtime when taking your medications at least every other day Also check some readings around lunch or before supper at least every other day May check the morning sugar on alternate days also  If blood sugar at bedtime is under 120 reduce the number of clicks at suppertime down to 5 otherwise continue 6 clicks Try to bolus with clicks before EVERY meal consistently  Have diabetic boost or other source of protein at bedtime  Avoid fatty meats and a lot of carbohydrates with meals  Counseling time on subjects discussed in assessment and plan sections is over 50% of today's 25 minute visit    Elayne Snare 04/26/2017, 11:38 AM   Note: This office note was prepared with Dragon voice recognition system technology. Any transcriptional errors that result from this process are unintentional.    Elayne Snare

## 2017-04-26 ENCOUNTER — Encounter: Payer: Self-pay | Admitting: Endocrinology

## 2017-04-26 ENCOUNTER — Ambulatory Visit: Payer: Medicare Other | Admitting: Endocrinology

## 2017-04-26 VITALS — BP 126/70 | HR 60 | Ht 71.0 in | Wt 233.4 lb

## 2017-04-26 DIAGNOSIS — E782 Mixed hyperlipidemia: Secondary | ICD-10-CM | POA: Diagnosis not present

## 2017-04-26 DIAGNOSIS — Z794 Long term (current) use of insulin: Secondary | ICD-10-CM

## 2017-04-26 DIAGNOSIS — E1165 Type 2 diabetes mellitus with hyperglycemia: Secondary | ICD-10-CM

## 2017-04-26 LAB — COMPREHENSIVE METABOLIC PANEL
ALBUMIN: 3.7 g/dL (ref 3.5–5.2)
ALK PHOS: 74 U/L (ref 39–117)
ALT: 14 U/L (ref 0–53)
AST: 15 U/L (ref 0–37)
BUN: 21 mg/dL (ref 6–23)
CHLORIDE: 104 meq/L (ref 96–112)
CO2: 27 mEq/L (ref 19–32)
CREATININE: 1.03 mg/dL (ref 0.40–1.50)
Calcium: 9 mg/dL (ref 8.4–10.5)
GFR: 74.23 mL/min (ref >60.00)
Glucose, Bld: 201 mg/dL — ABNORMAL HIGH (ref 70–99)
POTASSIUM: 4.6 meq/L (ref 3.5–5.1)
SODIUM: 140 meq/L (ref 135–145)
Total Bilirubin: 0.4 mg/dL (ref 0.2–1.2)
Total Protein: 6.2 g/dL (ref 6.0–8.3)

## 2017-04-26 LAB — LIPID PANEL
CHOLESTEROL: 121 mg/dL (ref 0–200)
HDL: 31.8 mg/dL — AB (ref >39.00)
NonHDL: 88.84
Total CHOL/HDL Ratio: 4
Triglycerides: 397 mg/dL — ABNORMAL HIGH (ref 0.0–149.0)
VLDL: 79.4 mg/dL — AB (ref 0.0–40.0)

## 2017-04-26 LAB — POCT GLYCOSYLATED HEMOGLOBIN (HGB A1C): HEMOGLOBIN A1C: 8.4

## 2017-04-26 LAB — LDL CHOLESTEROL, DIRECT: LDL DIRECT: 47 mg/dL

## 2017-04-26 MED ORDER — ICOSAPENT ETHYL 1 G PO CAPS: ORAL_CAPSULE | ORAL | 2 refills | Status: DC

## 2017-04-26 NOTE — Patient Instructions (Signed)
Please try to check your sugar at bedtime when taking your medications at least every other day Also check some readings around lunch or before supper at least every other day May check the morning sugar on alternate days also  If blood sugar at bedtime is under 120 reduce the number of clicks at suppertime down to 5 otherwise continue 6 clicks Try to bolus with clicks before EVERY meal consistently  Have diabetic boost or other source of protein at bedtime  Avoid fatty meats and a lot of carbohydrates with meals

## 2017-04-28 ENCOUNTER — Telehealth: Payer: Self-pay

## 2017-04-28 ENCOUNTER — Other Ambulatory Visit (INDEPENDENT_AMBULATORY_CARE_PROVIDER_SITE_OTHER): Payer: Medicare Other

## 2017-04-28 DIAGNOSIS — E875 Hyperkalemia: Secondary | ICD-10-CM

## 2017-04-28 LAB — BASIC METABOLIC PANEL
BUN: 20 mg/dL (ref 6–23)
CO2: 29 mEq/L (ref 19–32)
Calcium: 9.2 mg/dL (ref 8.4–10.5)
Chloride: 97 mEq/L (ref 96–112)
Creatinine, Ser: 1.01 mg/dL (ref 0.40–1.50)
GFR: 75.93 mL/min (ref >60.00)
GLUCOSE: 381 mg/dL — AB (ref 70–99)
POTASSIUM: 4.4 meq/L (ref 3.5–5.1)
SODIUM: 135 meq/L (ref 135–145)

## 2017-04-28 NOTE — Telephone Encounter (Signed)
Need to know what his blood sugars are both morning and evening over the last 3 to 4 days.  If he does not use the V-go pump he will have to take the insulin with the syringe 3 times a day

## 2017-04-28 NOTE — Telephone Encounter (Signed)
Pt called and informed of MD instructions. Pt verbalized understanding and stated that he will call back tomorrow and schedule an appointment.

## 2017-04-28 NOTE — Telephone Encounter (Signed)
Had to call pt concerning [employer] paperwork(which he states he returned but no record of it) and pt wanted to inform Dr. Dwyane Dee that his blood sugar was 379 today and that he had followed all directions from visit on Monday, pt also stated that he wants to be placed on something other than the patches because he stated that he had another bad one and that they are too expensive to keep throwing away. Pt would like someone to call him on home #

## 2017-04-28 NOTE — Telephone Encounter (Signed)
Blood sugars for the past 3-4 days have been  -morning 04/28/17 @ 0603 *349 -evening 04/27/17 @  1722 *343 -morning 04/27/17 @ 0647 *80 -evening 04/26/17 @ 1856 *136 -morning 04/26/17 @ 0657 *138 -evening 04/25/17 did not check -morning 04/25/17 @ 0806 *146

## 2017-04-28 NOTE — Telephone Encounter (Signed)
He will use an insulin syringe, 0.3 cc if he does not have this for the U-500 insulin He will take the injection 30 minutes before eating and start with 14 units as marked on the syringe at breakfast, 12 before lunch and 14 before dinner. Will need to see him back in a couple of weeks for follow-up

## 2017-05-10 ENCOUNTER — Encounter: Payer: Self-pay | Admitting: Psychology

## 2017-05-10 ENCOUNTER — Ambulatory Visit: Payer: Medicare Other | Admitting: Psychology

## 2017-05-10 DIAGNOSIS — F32A Depression, unspecified: Secondary | ICD-10-CM

## 2017-05-10 DIAGNOSIS — R413 Other amnesia: Secondary | ICD-10-CM | POA: Diagnosis not present

## 2017-05-10 DIAGNOSIS — F329 Major depressive disorder, single episode, unspecified: Secondary | ICD-10-CM

## 2017-05-10 NOTE — Progress Notes (Signed)
NEUROBEHAVIORAL STATUS EXAM   Name: Joshua [name] Date of Birth: 03/04/1940 Date of Interview: 05/10/2017  Reason for Referral:  Joshua [name] is a 77 y.o. male who is referred for neuropsychological evaluation by Dr. Ellouise Newer of [facility] due to concerns about memory loss. This patient is accompanied in the office by his wife who supplements the history.  History of Presenting Problem:  The patient was seen for neurologic consultation of memory loss and mood changes by Dr. Delice Lesch on 01/11/2017 (MMSE = 29/30). His psychologist had recommended a neurology referral due to refractory depression; he has tried several medications but his wife has not noticed any improvement in mood. He and his wife reported cognitive changes and mood changes coincided and began within a year after his CABG in 2015. The patient denied family history of dementia. He also denied history of significant head injuries or alcohol use. Brain MRI was completed 01/26/2017 and reportedly revealed no acute intracranial process, old right basal ganglia infarct, and mild to moderate global parenchymal brain volume loss. The patient also has a history of poorly controlled diabetes, followed by Dr. Dwyane Dee (most recent A1c was 8.4).  At today's visit (05/10/2017), the patient reports memory difficulties that are not constant but that are noticeable some days. He and his wife endorsed that he is experiencing the following symptoms: forgetfulness for details of recent conversations and events, repeating statements/questions (per his wife), forgetting to take noon time meds, occasional difficulty concentrating, processing information more slowly, word finding difficulty, and difficulty with navigation/driving directions (per his wife). They initially said the symptoms have been present for a year or less but when I reminded them of his history of CABG in 2015 his wife agreed that they were first noticed within a year of that  surgery.  Joshua [name] continues to drive (no MVAs), do some meal preparation and manage his appointments. He denies missing or forgetting appointments. His wife fills his pill planner. He states he usually remembers to take his AM and PM meds but may forget his noon meds. His wife took over the finances a few years ago. He mainly stays at home and watches television. He does have one friend who he occasionally meets for coffee. His wife states he socializes much less than he used to, even with his own children. He reports that this could be due to his feeling that he has "let everyone down". He could not elaborate further except to say that he does not have the ability to go and do like he used to, due to physical limitations. He has significant shortness of breath and weakness. He uses a walker to ambulate and has to rest frequently. He dozes off during the day. He does not get any regular physical activity. He has not had any falls in the past year.   When asked about mood, he endorsed feeling down, irritable/frustrated, and anxious recently. He endorsed reduced interest in things he used to enjoy. He cannot recall feeling joy at any time in the past month. He has not had hallucinations. He denies past or present suicidal ideation. He denies sleep difficulty. His appetite is good. He has no prior psychiatric history. He saw a psychologist on 2 or 3 occasions in the past year but didn't find it helpful.    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 Acute respiratory distress 06/04/2016  . Adenomatous colon polyp 10/16/2011   Repeat 2018  . Arthritis     Hips, R>L & KNEES   . CAD, multiple vessel    3V CAD cath 08/08/13----CABG done shortly after.  Cath 06/2016: clear bipasses/no interventional lesion.  . 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 pylori neg, no dysplasia or malignancy.  . Chronic combined systolic and diastolic CHF, NYHA class 2 ([place]) 2016-2018   Followed by Advanced [facility] clinic.  Transesoph Echo 09/2016 EF improved to 40-45%; diffuse hypokinesis, Grd I DD.  [name] Chronic renal insufficiency, stage 3 (moderate) (HCC) 2018   GFR 50s  . COPD (chronic obstructive pulmonary disease) ([place])    GOLD II.  Spirometry  2004 borderline obstruction; 2015 mod obst: noncompliant with bid symbicort so pulmonologist switched him to a once daily inhaler: Trelegy ellipta 12/2015.  Oxygen prn: goal 88-92%.  . Diabetic nephropathy ([place])    Elevated urine microalb/cr 03/2011  . DM type 2 (diabetes mellitus, type 2) ([place])    Poor control on max oral meds--pt eventually agreed to insulin therapy.  As of 2017 his DM is being managed by Dr. Dwyane Dee in endo.  . DOE (dyspnea on exertion)    COPD + chronic diastolic HF  . Dysthymia    Worsened after 2015 heart surgery.  Memory loss +? as well.  Neuro eval 01/2017--plan is to get MRI brain, B12 level, and neuropsychiatric testing.  . Erectile dysfunction    Normal testosterone  . Hearing loss of both ears 2016   Hearing aids  . Hyperlipidemia   . Hyperplastic colon polyp 2001   No further colonoscopies to be done as of 11/2016 GI eval.  . Hypertension   . IBS (irritable bowel syndrome)    responds well to levsin  . Iron deficiency anemia 2014   03/2012 capsule endoscopy showed 2 AVMs--likely responsible for his IDA--lifetime iron supp recommended + q38moCBCs.  . Ischemic cardiomyopathy 06/2016; 09/2016   06/2016: EF 25-30%, global hypokinesis, grd II DD, vent septum motion changes c/w LBBB, mod MV regurg, L atr mod/sev dilat, R atr mod dilat, mod incr pulm press, Grd II DD.  09/2016--EF 40-45%;  diffuse hypokinesis, Grd I DD.  . Macular degeneration, dry    Mild, bilat (Optometrist, [place]  . Memory impairment   . Obesity   . Open toe wound 11/2015   Wound care clinic appt made and then canceled when toe improved.  . Other and unspecified angina pectoris   . PAD (peripheral artery disease) ([place] 02/2016   Abnormal ABI's and waveforms: LE arterial duplex ordered for f/u as per cardiologist's recommendation.  Vasc eval by Dr. CBridgett Larsson impression was minimal PAD, recommended maximize med mgmt.  .Marland KitchenPAF (paroxysmal atrial fibrillation) [place] 10/2014   xarelto  . Pericardial effusion with cardiac tamponade 6//29/15   pericardiocentesis was done,  Infectious/inflamm (cytology showed NO MALIGNANT CELLS)  . Pleural plaque    Pleural plaques/asbestosis changes on CT chest done by pulm 05/2016.  .Marland KitchenPneumonia   . Tobacco dependence in remission    100+ pack-yr hx: quit after CABG      Current Medications:  Outpatient Encounter Medications as of 05/10/2017  Medication Sig  . ACCU-CHEK GUIDE  test strip USE TO CHECK BLOOD SUGAR 3 TIMES DAILY  . acetaminophen (TYLENOL) 325 MG tablet Take 2 tablets (650 mg total) by mouth every 4 (four) hours as needed for headache or mild pain.  . Ascorbic Acid (VITAMIN C) 1000 MG tablet Take 1,000 mg by mouth daily.  [name] atorvastatin (LIPITOR) 40 MG tablet TAKE ONE TABLET BY MOUTH EVERY DAY  . bisoprolol (ZEBETA) 5 MG tablet Take 1.5 tablets (7.5 mg total) by mouth daily.  . Cholecalciferol (VITAMIN D-3) 1000 units CAPS Take 1,000 Units by mouth daily. Reported on 02/26/2015  . DIGOX 125 MCG tablet TAKE ONE TABLET BY MOUTH EVERY DAY  . fluticasone (FLONASE) 50 MCG/ACT nasal spray Place 2 sprays into both nostrils daily.  [name] gabapentin (NEURONTIN) 100 MG capsule Take 200 mg by mouth 3 (three) times daily.  [name] guaiFENesin (MUCINEX) 600 MG 12 hr tablet Take 2 tablets (1,200 mg total) by mouth 2 (two) times daily.  .  hyoscyamine (LEVSIN SL) 0.125 MG SL tablet 1-2 tabs po q6h prn diarrhea  . Icosapent Ethyl (VASCEPA) 1 g CAPS 2 capsules twice a day  . Insulin Disposable Pump (V-GO 20) KIT Apply one Vgo 20 pump daily.  . insulin regular human CONCENTRATED (HUMULIN R) 500 UNIT/ML injection Use 56 units daily in insulin pump  . Insulin Syringe-Needle U-100 30G X 1/2" 1 ML MISC Use to fill insulin VGo pump one time daily  . isosorbide mononitrate (IMDUR) 30 MG 24 hr tablet TAKE ONE TABLET BY MOUTH EVERY DAY  . losartan (COZAAR) 25 MG tablet Take 1 tablet (25 mg total) 2 (two) times daily with a meal by mouth.  . mirtazapine (REMERON) 45 MG tablet Take 1 tablet (45 mg total) by mouth at bedtime.  . Multiple Vitamin (MULTIVITAMIN WITH MINERALS) TABS Take 1 tablet by mouth daily.  . nitroGLYCERIN (NITROSTAT) 0.4 MG SL tablet PLACE 1 TABLET UNDER THE TONGUE EVERY 5 MINUTES AS NEEDED FOR CHEST PAIN  . OXYGEN Inhale 2 L into the lungs.  [name] PROAIR HFA 108 (90 Base) MCG/ACT inhaler INHALE 2 PUFFS INTO THE LUNGS EVERY 4 HOURS AS NEEDED FOR WHEEZING ORSHORTNESS OF BREATH  . rivaroxaban (XARELTO) 20 MG TABS tablet Take 1 tablet (20 mg total) by mouth daily with supper.  . sertraline (ZOLOFT) 25 MG tablet TAKE TWO TABLETS BY MOUTH EVERY DAY  . spironolactone (ALDACTONE) 25 MG tablet TAKE ONE TABLET BY MOUTH EVERY DAY  . SYMBICORT 160-4.5 MCG/ACT inhaler INHALE 2 PUFFS INTO THE LUNGS TWICE DAILY  . SYNJARDY 05-998 MG TABS TAKE ONE TABLET BY MOUTH TWICE DAILY AFTER A MEAL  . tamsulosin (FLOMAX) 0.4 MG CAPS capsule TAKE 1 CAPSULE BY MOUTH EVERY DAY AFTER SUPPER  . torsemide (DEMADEX) 20 MG tablet TAKE TWO TABLETS BY MOUTH EVERY DAY   No facility-administered encounter medications on file as of 05/10/2017.      Behavioral Observations:   Appearance: Casually and appropriately dressed and groomed Gait: Ambulated slowly with a wheeled walker Speech: Fluent; reduced rate, reduced rhythm (somewhat monotone). No significant word  finding difficulty observed during conversational speech. Thought process: Linear, goal directed Affect: Blunted to flat Interpersonal: Compliant, appropriate   40 minutes spent face-to-face with patient completing neurobehavioral status exam. 30 minutes spent integrating medical records/clinical data and completing this report. CPT code [phone].   TESTING: There is medical necessity to proceed with neuropsychological assessment as the results will be used to aid in differential diagnosis and clinical decision-making and to inform specific treatment recommendations.  Per the patient, his wife and medical records reviewed, there has been a change in cognitive functioning and a reasonable suspicion of MCI (vascular) versus pseudodementia (due to depression).  Clinical Decision Making: In considering the patient's current level of functioning, level of presumed impairment, nature of symptoms, emotional and behavioral responses during the interview, level of literacy, and observed level of motivation, a battery of tests was selected and communicated to the psychometrician.   Following the clinical interview/neurobehavioral status exam, the patient completed this full battery of neuropsychological testing with my psychometrician under my supervision (see separate note).   PLAN: The patient will return to see me for a follow-up session at which time his test performances and my impressions and treatment recommendations will be reviewed in detail.  Evaluation ongoing; full report to follow.

## 2017-05-10 NOTE — Progress Notes (Signed)
Neuropsychology Note  Joshua [name] completed 60 minutes of neuropsychological testing with technician, Milana Kidney, BS, under the supervision of Dr. Macarthur Critchley, Licensed Psychologist. The patient did not appear overtly distressed by the testing session, per behavioral observation or via self-report to the technician. Rest breaks were offered.   Clinical Decision Making: In considering the patient's current level of functioning, level of presumed impairment, nature of symptoms, emotional and behavioral responses during the interview, level of literacy, and observed level of motivation/effort, a battery of tests was selected and communicated to the psychometrician.  Communication between the psychologist and technician was ongoing throughout the testing session and changes were made as deemed necessary based on patient performance on testing, technician observations and additional pertinent factors such as those listed above.  Joshua [name] will return within approximately 2 weeks for an interactive feedback session with Dr. Si Raider at which time his test performances, clinical impressions and treatment recommendations will be reviewed in detail. The patient understands he can contact our office should he require our assistance before this time.  35 minutes spent performing neuropsychological evaluation services/clinical decision making (psychologist). [CPT 11155] 60 minutes spent face-to-face with patient administering standardized tests, 30 minutes spent scoring (technician). [CPT [id number], 20802]  Full report to follow.

## 2017-05-12 ENCOUNTER — Other Ambulatory Visit: Payer: Self-pay | Admitting: Family Medicine

## 2017-05-12 ENCOUNTER — Other Ambulatory Visit: Payer: Self-pay | Admitting: Endocrinology

## 2017-05-13 ENCOUNTER — Other Ambulatory Visit: Payer: Self-pay | Admitting: Endocrinology

## 2017-05-14 DIAGNOSIS — J449 Chronic obstructive pulmonary disease, unspecified: Secondary | ICD-10-CM | POA: Diagnosis not present

## 2017-05-14 DIAGNOSIS — J9611 Chronic respiratory failure with hypoxia: Secondary | ICD-10-CM | POA: Diagnosis not present

## 2017-05-14 DIAGNOSIS — I5032 Chronic diastolic (congestive) heart failure: Secondary | ICD-10-CM | POA: Diagnosis not present

## 2017-05-17 ENCOUNTER — Other Ambulatory Visit: Payer: Self-pay | Admitting: Family Medicine

## 2017-05-25 ENCOUNTER — Encounter (HOSPITAL_COMMUNITY): Payer: Self-pay | Admitting: Cardiology

## 2017-05-25 ENCOUNTER — Ambulatory Visit (HOSPITAL_COMMUNITY)
Admission: RE | Admit: 2017-05-25 | Discharge: 2017-05-25 | Disposition: A | Discharge status: Home / Self Care | Payer: Medicare Other | Source: Ambulatory Visit | Attending: Cardiology | Admitting: Cardiology

## 2017-05-25 ENCOUNTER — Other Ambulatory Visit: Payer: Self-pay | Admitting: Endocrinology

## 2017-05-25 ENCOUNTER — Other Ambulatory Visit: Payer: Self-pay | Admitting: Family Medicine

## 2017-05-25 ENCOUNTER — Other Ambulatory Visit: Payer: Self-pay

## 2017-05-25 VITALS — BP 116/48 | HR 56 | Wt 228.0 lb

## 2017-05-25 DIAGNOSIS — Z833 Family history of diabetes mellitus: Secondary | ICD-10-CM | POA: Insufficient documentation

## 2017-05-25 DIAGNOSIS — I5022 Chronic systolic (congestive) heart failure: Secondary | ICD-10-CM | POA: Diagnosis not present

## 2017-05-25 DIAGNOSIS — Z09 Encounter for follow-up examination after completed treatment for conditions other than malignant neoplasm: Secondary | ICD-10-CM | POA: Diagnosis not present

## 2017-05-25 DIAGNOSIS — Z7901 Long term (current) use of anticoagulants: Secondary | ICD-10-CM | POA: Insufficient documentation

## 2017-05-25 DIAGNOSIS — Z888 Allergy status to other drugs, medicaments and biological substances status: Secondary | ICD-10-CM | POA: Diagnosis not present

## 2017-05-25 DIAGNOSIS — I110 Hypertensive heart disease with heart failure: Secondary | ICD-10-CM | POA: Insufficient documentation

## 2017-05-25 DIAGNOSIS — I480 Paroxysmal atrial fibrillation: Secondary | ICD-10-CM | POA: Insufficient documentation

## 2017-05-25 DIAGNOSIS — I255 Ischemic cardiomyopathy: Secondary | ICD-10-CM | POA: Diagnosis not present

## 2017-05-25 DIAGNOSIS — E119 Type 2 diabetes mellitus without complications: Secondary | ICD-10-CM | POA: Diagnosis not present

## 2017-05-25 DIAGNOSIS — Z77090 Contact with and (suspected) exposure to asbestos: Secondary | ICD-10-CM | POA: Diagnosis not present

## 2017-05-25 DIAGNOSIS — I959 Hypotension, unspecified: Secondary | ICD-10-CM | POA: Diagnosis not present

## 2017-05-25 DIAGNOSIS — Z885 Allergy status to narcotic agent status: Secondary | ICD-10-CM | POA: Diagnosis not present

## 2017-05-25 DIAGNOSIS — Z79899 Other long term (current) drug therapy: Secondary | ICD-10-CM | POA: Diagnosis not present

## 2017-05-25 DIAGNOSIS — Z823 Family history of stroke: Secondary | ICD-10-CM | POA: Insufficient documentation

## 2017-05-25 DIAGNOSIS — Z951 Presence of aortocoronary bypass graft: Secondary | ICD-10-CM | POA: Insufficient documentation

## 2017-05-25 DIAGNOSIS — Z803 Family history of malignant neoplasm of breast: Secondary | ICD-10-CM | POA: Insufficient documentation

## 2017-05-25 DIAGNOSIS — Z8249 Family history of ischemic heart disease and other diseases of the circulatory system: Secondary | ICD-10-CM | POA: Insufficient documentation

## 2017-05-25 DIAGNOSIS — E785 Hyperlipidemia, unspecified: Secondary | ICD-10-CM | POA: Insufficient documentation

## 2017-05-25 DIAGNOSIS — J449 Chronic obstructive pulmonary disease, unspecified: Secondary | ICD-10-CM | POA: Insufficient documentation

## 2017-05-25 DIAGNOSIS — Z87891 Personal history of nicotine dependence: Secondary | ICD-10-CM | POA: Insufficient documentation

## 2017-05-25 DIAGNOSIS — I447 Left bundle-branch block, unspecified: Secondary | ICD-10-CM | POA: Diagnosis not present

## 2017-05-25 DIAGNOSIS — I2510 Atherosclerotic heart disease of native coronary artery without angina pectoris: Secondary | ICD-10-CM | POA: Diagnosis not present

## 2017-05-25 DIAGNOSIS — Z794 Long term (current) use of insulin: Secondary | ICD-10-CM | POA: Diagnosis not present

## 2017-05-25 LAB — BASIC METABOLIC PANEL
Anion gap: 11 (ref 5–15)
BUN: 23 mg/dL — AB (ref 6–20)
CALCIUM: 9.1 mg/dL (ref 8.9–10.3)
CO2: 26 mmol/L (ref 22–32)
CREATININE: 1.11 mg/dL (ref 0.61–1.24)
Chloride: 104 mmol/L (ref 101–111)
GFR calc Af Amer: >60 mL/min (ref >60)
GLUCOSE: 101 mg/dL — AB (ref 65–99)
Potassium: 4.5 mmol/L (ref 3.5–5.1)
Sodium: 141 mmol/L (ref 135–145)

## 2017-05-25 LAB — DIGOXIN LEVEL: Digoxin Level: 0.6 ng/mL — ABNORMAL LOW (ref 0.8–2.0)

## 2017-05-25 NOTE — Patient Instructions (Signed)
Labs drawn today (if we do not call you, then your lab work was stable)   Your physician recommends that you schedule a follow-up appointment in: 4 months with Dr. [name]  Please Call an Schedule Appointment (July, 2019)

## 2017-05-25 NOTE — Progress Notes (Signed)
Advanced Heart Failure Clinic Note   Primary Care: Dr. Anitra Lauth Primary Cardiologist: Dr. [name]  HPI:  Joshua [name] is a 77 y.o. 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   Admitted 5/31 -> 06/11/16 with worsening SOB, volume overload, and increased 02 demand. Echo showed EF down to 25-30%.  Diuresed on IV lasix and given solumedrol. Thought to be mixed AECOPD and acute CHF. Moved to stepdown 06/05/16 with worsening resp status and BiPAP requirement. Improved with nebs, steroids, ABX and IV lasix + metolazone. Sent home on [place] and prednisone taper. Overall diuresed 22 lbs. He had left and right heart cath with patent grafts and no interventional target.  Discharge weight 233 lbs.   Pt admitted from 6/11 -> 06/17/16 with syncope (unwitnessed) Noted to have fall x 3. EKG with LBBB. Creatinine mildly elevated. CT head negative for acute process. R hip xray negative for fracture or dislocated hardware.  Felt to be vasovagal. Received NS and diuretics held up to discharge.  Creatinine improved.  Unable to tolerate Entresto due to lightheadedness/hypotension.   He returns today for followup of CHF.  He is not using oxygen.  Short of breath with hills/inclines.  Weight down 4 lbs.  No orthopnea/PND.  No dizziness or chest pain.   Labs (8/18): K 4.7, creatinine 1.05 Labs (9/18): K 4.3, creatinine 1.44 Labs (10/18): K 4.9, creatinine 0.99 Labs (11/18): K 4.5, creatinine 1.32, digoxin 0.3 Labs (4/19): TGs 397, LDL 47, K 4.4, creatinine 1.01  PMH:  1. CAD: s/p CABG in 8/15.  - LHC (6/18) with 80% distal LM, TO pLAD, TO ostial LCx, subtotal occlusion distal RCA.  LIMA-LAD patent, Y graft to OM and D patent (50% stenosis in graft at touchdown on OM), patent SVG-PDA.  2. Chronic systolic CHF: Ischemic cardiomyopathy.  Echo (6/18) with EF 25-30%, diffuse HK, moderate MR, mildly dilated RV with mildly decreased systolic function, PASP 48 mmHg.  - RHC  (6/18): mean RA 10, PA 45/26, mean PCWP 21, CI 2.11 (Fick) and 2.41 (thermo), PVR 2.9 WU.  - Echo (9/18): EF 40-45%, septal-lateral dyssynchrony, diffuse hypokinesis, normal RV size and systolic function.  3. COPD: Prior smoker.   4. Hyperlipidemia 5. Atrial fibrillation: Paroxysmal.  6. Type II diabetes 7. HTN 8. Anemia: h/o Fe deficiency 9. 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  10. H/o pericardial effusion with tamponade in 6/15.  11. HTN 12. LBBB  Review of systems complete and found to be negative unless listed in HPI.     Current Outpatient Medications  Medication Sig Dispense Refill  . ACCU-CHEK GUIDE test strip USE TO CHECK BLOOD SUGAR 3 TIMES DAILY 100 each 11  . acetaminophen (TYLENOL) 325 MG tablet Take 2 tablets (650 mg total) by mouth every 4 (four) hours as needed for headache or mild pain.    . Ascorbic Acid (VITAMIN C) 1000 MG tablet Take 1,000 mg by mouth daily.    [name] atorvastatin (LIPITOR) 40 MG tablet Take 40 mg by mouth daily.    . bisoprolol (ZEBETA) 5 MG tablet Take 1.5 tablets (7.5 mg total) by mouth daily. 45 tablet 6  . Cholecalciferol (VITAMIN D-3) 1000 units CAPS Take 1,000 Units by mouth daily. Reported on 02/26/2015    . DIGOX 125 MCG tablet TAKE ONE TABLET BY MOUTH EVERY DAY 30 tablet 6  . fluticasone (FLONASE) 50 MCG/ACT nasal spray Place 2 sprays into both nostrils daily.  16 g 1  . gabapentin (NEURONTIN) 100 MG capsule Take 200 mg by mouth 3 (three) times daily.    [name] guaiFENesin (MUCINEX) 600 MG 12 hr tablet Take 2 tablets (1,200 mg total) by mouth 2 (two) times daily. 30 tablet 0  . hyoscyamine (LEVSIN SL) 0.125 MG SL tablet 1-2 tabs po q6h prn diarrhea 60 tablet 3  . Icosapent Ethyl (VASCEPA) 1 g CAPS 2 capsules twice a day 120 capsule 2  . Insulin Disposable Pump (V-GO 20) KIT Apply one Vgo 20 pump daily. 30 kit 3  . insulin regular human CONCENTRATED (HUMULIN R) 500 UNIT/ML injection INJECT 56 UNITS SUBCUTANEOUSLY  DAILY IN INSULIN PUMP 20 mL 3  . insulin regular human CONCENTRATED (HUMULIN R) 500 UNIT/ML injection INJECT 56 UNITS SUBCUTANEOUSLY DAILY IN INSULIN PUMP 20 mL 3  . Insulin Syringe-Needle U-100 30G X 1/2" 1 ML MISC Use to fill insulin VGo pump one time daily 100 each 2  . isosorbide mononitrate (IMDUR) 30 MG 24 hr tablet TAKE ONE TABLET BY MOUTH EVERY DAY 90 tablet 0  . losartan (COZAAR) 25 MG tablet Take 1 tablet (25 mg total) 2 (two) times daily with a meal by mouth. 60 tablet 3  . mirtazapine (REMERON) 45 MG tablet Take 1 tablet (45 mg total) by mouth at bedtime. 30 tablet 5  . Multiple Vitamin (MULTIVITAMIN WITH MINERALS) TABS Take 1 tablet by mouth daily.    . nitroGLYCERIN (NITROSTAT) 0.4 MG SL tablet PLACE 1 TABLET UNDER THE TONGUE EVERY 5 MINUTES AS NEEDED FOR CHEST PAIN 25 tablet 3  . OXYGEN Inhale 2 L into the lungs.    [name] PROAIR HFA 108 (90 Base) MCG/ACT inhaler INHALE 2 PUFFS INTO THE LUNGS EVERY 4 HOURS AS NEEDED FOR WHEEZING ORSHORTNESS OF BREATH 8.5 g 1  . rivaroxaban (XARELTO) 20 MG TABS tablet Take 1 tablet (20 mg total) by mouth daily with supper. 90 tablet 3  . spironolactone (ALDACTONE) 25 MG tablet TAKE ONE TABLET BY MOUTH EVERY DAY 30 tablet 6  . SURE COMFORT INSULIN SYRINGE 31G X 5/16" 0.3 ML MISC USE TO INJECT 18 UNITS 3 TIMES DAILY 300 each 1  . SYMBICORT 160-4.5 MCG/ACT inhaler INHALE 2 PUFFS INTO THE LUNGS TWICE DAILY 10.2 g 5  . tamsulosin (FLOMAX) 0.4 MG CAPS capsule TAKE 1 CAPSULE BY MOUTH EVERY DAY AFTER SUPPER 90 capsule 1  . torsemide (DEMADEX) 20 MG tablet TAKE TWO TABLETS BY MOUTH EVERY DAY 60 tablet 6  . sertraline (ZOLOFT) 25 MG tablet TAKE TWO TABLETS BY MOUTH EVERY DAY 60 tablet 3  . SYNJARDY 05-998 MG TABS TAKE ONE TABLET BY MOUTH TWICE DAILY AFTER A MEAL 60 tablet 2   No current facility-administered medications for this encounter.     Allergies  Allergen Reactions  . Morphine And Related Other (See Comments)    Drenched with perspiration  . Entresto  [Sacubitril-Valsartan]     hypotension  . Demerol [Meperidine] Nausea Only  . Starlix [Nateglinide] Other (See Comments)    gassy      Social History   Socioeconomic History  . Marital status: Married    Spouse name: [name]  . Number of children: 4  . Years of education: Not on file  . Highest education level: Not on file  Occupational History  . Occupation: retired, former Scientist, forensic  Social Needs  . Financial resource strain: Not on file  . Food insecurity:    Worry: Not on file    Inability: Not  on file  . Transportation needs:    Medical: Not on file    Non-medical: Not on file  Tobacco Use  . Smoking status: Former Smoker    Packs/day: 0.25    Years: 62.00    Pack years: 15.50    Types: Cigarettes    Last attempt to quit: 10/23/2015    Years since quitting: 1.5  . Smokeless tobacco: Former Systems developer    Quit date: 06/30/2013  Substance and Sexual Activity  . Alcohol use: No    Alcohol/week: 0.0 oz  . Drug use: No  . Sexual activity: Never  Lifestyle  . Physical activity:    Days per week: Not on file    Minutes per session: Not on file  . Stress: Not on file  Relationships  . Social connections:    Talks on phone: Not on file    Gets together: Not on file    Attends religious service: Not on file    Active member of club or organization: Not on file    Attends meetings of clubs or organizations: Not on file    Relationship status: Not on file  . Intimate partner violence:    Fear of current or ex partner: Not on file    Emotionally abused: Not on file    Physically abused: Not on file    Forced sexual activity: Not on file  Other Topics Concern  . Not on file  Social History Narrative   Married, 4 children.   Formerly a Medical illustrator.   Level of education: HS.     +tobacco--lifelong/ quit 2015).  No alcohol or drugs.   No exercise.  +excessive caffeine.      Lives in 1 story home with his wife and her sister      Family History    Problem Relation Age of Onset  . Heart disease Father   . Heart attack Father   . CVA Mother   . Hypertension Mother   . Diabetes Paternal Grandmother   . Breast cancer Sister   . Diabetes Maternal Uncle   . Heart disease Maternal Uncle   . Stroke Neg Hx     Vitals:   05/25/17 0849  BP: (!) 116/48  Pulse: (!) 56  SpO2: 96%  Weight: 228 lb (103.4 kg)   Wt Readings from Last 3 Encounters:  05/25/17 228 lb (103.4 kg)  04/26/17 233 lb 6.4 oz (105.9 kg)  04/19/17 231 lb (104.8 kg)     PHYSICAL EXAM: General: NAD Neck: No JVD, no thyromegaly or thyroid nodule.  Lungs: Occasional rhonchi CV: Nondisplaced PMI.  Heart regular S1/S2, no S3/S4, no murmur.  Trace ankle edema.  No carotid bruit.  Normal pedal pulses.  Abdomen: Soft, nontender, no hepatosplenomegaly, no distention.  Skin: Intact without lesions or rashes.  Neurologic: Alert and oriented x 3.  Psych: Normal affect. Extremities: No clubbing or cyanosis.  HEENT: Normal.   ASSESSMENT & PLAN:  1. Chronic systolic CHF: Ischemic cardiomyopathy.  Echo (6/18) with EF 25-30%.  Repeat echo in 9/18 with EF 40-45%, septal-lateral dyssynchrony.  Stable NYHA class II-III symptoms.  Weight down 4 lbs.  - Continue torsemide 40 mg daily and spironolactone 25 mg daily. BMET today.  - He did not tolerate low dose Entresto due to hypotension. Continue losartan 25 mg bid. - Continue digoxin, check level today. - Continue bisoprolol 7.5 mg daily, will not increase with HR in 50s.  - Continue spironolactone 25 mg daily.   -  EF is > 35% on last echo, not candidate for CRT-D at this point.   2. COPD: No longer smoking.  Not using home oxygen at this time.  3. CAD s/p CABG: Cath 06/10/16 with severe 3 vessel native disease with patent bypass grafts. No interventional target.  - Continue statin.  With stable CAD and Xarelto use, does not need to be on ASA.  4. Atrial fibrillation: Paroxysmal.  He is in NSR today.  - Continue Xarelto.  5.  Type II diabetes: Continue empagliflozin.   Followup in 4 months.   Loralie Champagne, MD 05/25/17

## 2017-06-03 ENCOUNTER — Other Ambulatory Visit: Payer: Self-pay | Admitting: Family Medicine

## 2017-06-03 NOTE — Telephone Encounter (Signed)
RF request for hyoscyamine LOV: 04/19/17  Next ov: 08/18/17 Last written: 12/11/16 #60 w/ 3RF  Please advise. Thanks.

## 2017-06-04 ENCOUNTER — Other Ambulatory Visit (HOSPITAL_COMMUNITY): Payer: Self-pay | Admitting: Cardiology

## 2017-06-06 NOTE — Progress Notes (Signed)
NEUROPSYCHOLOGICAL EVALUATION   Name:    Joshua [name]  Date of Birth:   1940-11-27 Date of Interview:  05/10/2017 Date of Testing:  05/10/2017   Date of Feedback:  06/07/2017       Background Information:  Reason for Referral:  Joshua [name] is a 77 y.o. male referred by Dr. Ellouise Newer to assess his current level of cognitive functioning and assist in differential diagnosis. Joshua current evaluation consisted of a review of available medical records, an interview with Joshua [name] and his wife, and Joshua completion of a neuropsychological testing battery. Informed consent was obtained.  History of Presenting Problem:  Joshua [name] was seen for neurologic consultation of memory loss and mood changes by Dr. Delice Lesch on 01/11/2017 (MMSE = 29/30). His psychologist had recommended a neurology referral due to refractory depression; he has tried several medications but his wife has not noticed any improvement in mood. He and his wife reported cognitive changes and mood changes coincided and began within a year after his CABG in 2015. Joshua [name] denied family history of dementia. He also denied history of significant head injuries or alcohol use. Brain MRI was completed 01/26/2017 and reportedly revealed no acute intracranial process, old right basal ganglia infarct, and mild to moderate global parenchymal brain volume loss. Joshua [name] also has a history of poorly controlled diabetes, followed by Dr. Dwyane Dee (most recent A1c was 8.4).  At today's visit (05/10/2017), Joshua [name] reports memory difficulties that are not constant but that are noticeable some days. He and his wife endorsed that he is experiencing Joshua following symptoms: forgetfulness for details of recent conversations and events, repeating statements/questions (per his wife), forgetting to take noon time meds, occasional difficulty concentrating, processing information more slowly, word finding difficulty, and difficulty with navigation/driving  directions (per his wife). They initially said Joshua symptoms have been present for a year or less but when I reminded them of his history of CABG in 2015 his wife agreed that they were first noticed within a year of that surgery.  Joshua [name] continues to drive (no MVAs), he does some meal preparation, and he manages his appointments. He denies missing or forgetting appointments. His wife fills his pill planner. He states he usually remembers to take his AM and PM meds but may forget his noon meds. His wife took over Joshua finances a few years ago. He mainly stays at home and watches television. He does have one friend who he occasionally meets for coffee. His wife states he socializes much less than he used to, even with his own children. He reports that this could be due to his feeling that he has "let everyone down". He could not elaborate further except to say that he does not have Joshua ability to go and do like he used to, due to physical limitations. He has significant shortness of breath and weakness. He uses a walker to ambulate and has to rest frequently. He dozes off during Joshua day. He does not get any regular physical activity. He has not had any falls in Joshua past year.   When asked about mood, he endorsed feeling down, irritable/frustrated, and anxious recently. He endorsed reduced interest in things he used to enjoy. He cannot recall feeling joy at any time in Joshua past month. He has not had hallucinations. He denies past or present suicidal ideation. He denies sleep difficulty. His appetite is good. He has no prior psychiatric history. He saw a psychologist on 2  or 3 occasions in Joshua past year but didn't find it helpful.    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 Acute respiratory distress 06/04/2016  . Adenomatous colon polyp 10/16/2011   Repeat 2018  . Arthritis     Hips, R>L & KNEES  . CAD, multiple vessel    3V CAD cath 08/08/13----CABG done shortly after.  Cath 06/2016: clear bipasses/no interventional lesion.  . 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 pylori neg, no dysplasia or malignancy.  . Chronic combined systolic and diastolic CHF, NYHA class 2 ([place]) 2016-2018   Followed by [facility].  Transesoph Echo 09/2016 EF improved to 40-45%; diffuse hypokinesis, Grd I DD.  [name] Chronic renal insufficiency, stage 3 (moderate) (HCC) 2018   GFR 50s  . COPD (chronic obstructive pulmonary disease) ([place])    GOLD II.  Spirometry  2004 borderline obstruction; 2015 mod obst: noncompliant with bid symbicort so pulmonologist switched him to a once daily inhaler: Trelegy ellipta 12/2015.  Oxygen prn: goal 88-92%.  . Diabetic nephropathy ([place])    Elevated urine microalb/cr 03/2011  . DM type 2 (diabetes mellitus, type 2) ([place])    Poor control on max oral meds--pt eventually agreed to insulin therapy.  As of 2017 his DM is being managed by Dr. Dwyane Dee in endo.  . DOE (dyspnea on exertion)    COPD + chronic diastolic HF  . Dysthymia    Worsened after 2015 heart surgery.  Memory loss +? as well.  Neuro eval 01/2017--plan is to get MRI brain, B12 level, and neuropsychiatric testing.  . Erectile dysfunction    Normal testosterone  . Hearing loss of both ears 2016   Hearing aids  . Hyperlipidemia   . Hyperplastic colon polyp 2001   No further colonoscopies to be done as of 11/2016 GI eval.  . Hypertension   . IBS (irritable bowel syndrome)    responds well to levsin  . Iron deficiency anemia 2014   03/2012 capsule endoscopy showed 2 AVMs--likely responsible for his IDA--lifetime iron supp recommended + q43moCBCs.  .  Ischemic cardiomyopathy 06/2016; 09/2016   06/2016: EF 25-30%, global hypokinesis, grd II DD, vent septum motion changes c/w LBBB, mod MV regurg, L atr mod/sev dilat, R atr mod dilat, mod incr pulm press, Grd II DD.  09/2016--EF 40-45%; diffuse hypokinesis, Grd I DD.  . Macular degeneration, dry    Mild, bilat (Optometrist, [place]ford Knifeat [place] [place]  . Memory impairment   . Obesity   . Open toe wound 11/2015   Wound care clinic appt made and then canceled when toe improved.  . Other and unspecified angina pectoris   . PAD (peripheral artery disease) ([place] 02/2016   Abnormal ABI's and waveforms: LE arterial duplex ordered for f/u as per cardiologist's recommendation.  Vasc eval by Dr. CBridgett Larsson impression was minimal PAD, recommended maximize med mgmt.  .Marland KitchenPAF (paroxysmal atrial fibrillation) ([place] 10/2014   xarelto  . Pericardial effusion with cardiac tamponade 6//29/15   pericardiocentesis was done,  Infectious/inflamm (cytology showed NO MALIGNANT CELLS)  . Pleural plaque    Pleural plaques/asbestosis changes on CT  chest done by pulm 05/2016.  [name] Pneumonia   . Tobacco dependence in remission    100+ pack-yr hx: quit after CABG    Current medications:  Outpatient Encounter Medications as of 06/07/2017  Medication Sig  . ACCU-CHEK GUIDE test strip USE TO CHECK BLOOD SUGAR 3 TIMES DAILY  . acetaminophen (TYLENOL) 325 MG tablet Take 2 tablets (650 mg total) by mouth every 4 (four) hours as needed for headache or mild pain.  . Ascorbic Acid (VITAMIN C) 1000 MG tablet Take 1,000 mg by mouth daily.  [name] atorvastatin (LIPITOR) 40 MG tablet Take 40 mg by mouth daily.  . bisoprolol (ZEBETA) 5 MG tablet Take 1.5 tablets (7.5 mg total) by mouth daily.  . Cholecalciferol (VITAMIN D-3) 1000 units CAPS Take 1,000 Units by mouth daily. Reported on 02/26/2015  . DIGOX 125 MCG tablet TAKE ONE TABLET BY MOUTH EVERY DAY  . fluticasone (FLONASE) 50 MCG/ACT nasal spray Place 2 sprays into  both nostrils daily.  [name] gabapentin (NEURONTIN) 100 MG capsule Take 200 mg by mouth 3 (three) times daily.  [name] guaiFENesin (MUCINEX) 600 MG 12 hr tablet Take 2 tablets (1,200 mg total) by mouth 2 (two) times daily.  . hyoscyamine (LEVSIN SL) 0.125 MG SL tablet TAKE 1 TO 2 TABLETS BY MOUTH EVERY 6 HOURS AS NEEDED FOR DIARRHEA  . Icosapent Ethyl (VASCEPA) 1 g CAPS 2 capsules twice a day  . Insulin Disposable Pump (V-GO 20) KIT Apply one Vgo 20 pump daily.  . insulin regular human CONCENTRATED (HUMULIN R) 500 UNIT/ML injection INJECT 56 UNITS SUBCUTANEOUSLY DAILY IN INSULIN PUMP  . insulin regular human CONCENTRATED (HUMULIN R) 500 UNIT/ML injection INJECT 56 UNITS SUBCUTANEOUSLY DAILY IN INSULIN PUMP  . Insulin Syringe-Needle U-100 30G X 1/2" 1 ML MISC Use to fill insulin VGo pump one time daily  . isosorbide mononitrate (IMDUR) 30 MG 24 hr tablet TAKE ONE TABLET BY MOUTH EVERY DAY  . losartan (COZAAR) 25 MG tablet TAKE ONE TABLET BY MOUTH TWICE DAILY WITH A MEAL  . mirtazapine (REMERON) 45 MG tablet Take 1 tablet (45 mg total) by mouth at bedtime.  . Multiple Vitamin (MULTIVITAMIN WITH MINERALS) TABS Take 1 tablet by mouth daily.  . nitroGLYCERIN (NITROSTAT) 0.4 MG SL tablet PLACE 1 TABLET UNDER Joshua TONGUE EVERY 5 MINUTES AS NEEDED FOR CHEST PAIN  . OXYGEN Inhale 2 L into Joshua lungs.  [name] PROAIR HFA 108 (90 Base) MCG/ACT inhaler INHALE 2 PUFFS INTO Joshua LUNGS EVERY 4 HOURS AS NEEDED FOR WHEEZING ORSHORTNESS OF BREATH  . rivaroxaban (XARELTO) 20 MG TABS tablet Take 1 tablet (20 mg total) by mouth daily with supper.  . sertraline (ZOLOFT) 25 MG tablet TAKE TWO TABLETS BY MOUTH EVERY DAY  . spironolactone (ALDACTONE) 25 MG tablet TAKE ONE TABLET BY MOUTH EVERY DAY  . SURE COMFORT INSULIN SYRINGE 31G X 5/16" 0.3 ML MISC USE TO INJECT 18 UNITS 3 TIMES DAILY  . SYMBICORT 160-4.5 MCG/ACT inhaler INHALE 2 PUFFS INTO Joshua LUNGS TWICE DAILY  . SYNJARDY 05-998 MG TABS TAKE ONE TABLET BY MOUTH TWICE DAILY AFTER A  MEAL  . tamsulosin (FLOMAX) 0.4 MG CAPS capsule TAKE 1 CAPSULE BY MOUTH EVERY DAY AFTER SUPPER  . torsemide (DEMADEX) 20 MG tablet TAKE TWO TABLETS BY MOUTH EVERY DAY   No facility-administered encounter medications on file as of 06/07/2017.      Current Examination:  Behavioral Observations:  Appearance: Casually and appropriately dressed and groomed Gait: Ambulated slowly with a wheeled walker  Speech: Fluent; reduced rate, reduced rhythm (somewhat monotone). No significant word finding difficulty observed during conversational speech. Thought process: Linear, goal directed Affect: Blunted to flat Interpersonal: Compliant, appropriate Orientation: Oriented to all spheres. 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   Tests Administered: . Test of Premorbid Functioning (TOPF) . Wechsler Adult Intelligence Scale-Fourth Edition (WAIS-IV): Similarities, Music therapist, Coding and Digit Span subtests . Wechsler Memory Scale-Fourth Edition (WMS-IV) Older Adult Version (ages 68-90): Logical Memory I, II and Recognition subtests  . [employer] Verbal Learning Test - 2nd Edition (CVLT-2) Short Form . Repeatable Battery for Joshua Assessment of Neuropsychological Status (RBANS) Form A:  Figure Copy and Recall subtests and Semantic Fluency subtest . Neuropsychological Assessment Battery (NAB) Language Module, Form 1: Naming subtest . [place] Diagnostic Aphasia Examination: Complex Ideational Material subtest . Controlled Oral Word Association Test (COWAT) . Trail Making Test A and B . Clock drawing test . Beck Depression Inventory - Second Edition (BDI-II) . Generalized Anxiety Disorder 7 item screener (GAD-7)  Test Results: Note: Standardized scores are presented only for use by appropriately trained professionals and to allow for any future test-retest comparison. These scores should not be interpreted without consideration of all Joshua information that is contained  in Joshua rest of Joshua report. Joshua most recent standardization samples from Joshua test publisher or other sources were used whenever possible to derive standard scores; scores were corrected for age, gender, ethnicity and education when available.   Test Scores:  Test Name Raw Score Standardized Score Descriptor  TOPF 23/70 SS= 85 Low average  WAIS-IV Subtests     Similarities 8/36 ss= 4 Impaired  Block Design 30/66 ss= 11 Average  Coding 35/135 ss= 8 Low end of average  Digit Span Forward 9/16 ss= 9 Average  Digit Span Backward 7/16 ss= 9 Average  WMS-IV Subtests     LM I 16/53 ss= 5 Borderline  LM II 4/39 ss= 5 Borderline  LM II Recognition 16/23 Cum %: 26-50 Mildly below expectation  RBANS Subtests     Figure Copy 16/20 Z= -1 Low average  Figure Recall 15/20 Z= 0.6 Average  Semantic Fluency 11 Z= -1.7 Borderline  CVLT-II Scores     Trial 1 3/9 Z= -2.5 Impaired  Trial 4 5/9 Z= -1.5 Borderline  Trials 1-4 total 19/36 T= 39 Low average  SD Free Recall 6/9 Z= -0.5 Average  LD Free Recall 4/9 Z= -0.5 Average  LD Cued Recall 3/9 Z= -1.5 Borderline  Recognition Discriminability 9/9 hits, 3 false positives Z= 0.5 Average  Forced Choice Recognition 9/9  WNL  NAB Language subtest     Naming 27/31 T= 41 Low average  BDAE Subtest     Complex Ideational Material 9/12  Mildly impaired  COWAT-FAS 16 T= 34 Borderline  COWAT-Animals 10 T= 35 Borderline  Trail Making Test A  53" 1 error T= 47 Average  Trail Making Test B  D/C at 124" due to 5 errors   Severely impaired  Clock Drawing   WNL  BDI-II 21/63  Moderate  GAD-7 0/21  WNL      Description of Test Results:  Premorbid verbal intellectual abilities were estimated to have been within Joshua low average range based on a test of word reading. This is consistent with 10 years of formal education and reported lifelong learning difficulties in reading. Psychomotor processing speed was average. Auditory attention and working memory were  average. Visual-spatial construction was average to low average. Language abilities  were variable. Specifically, confrontation naming was low average, and semantic verbal fluency was borderline. Auditory comprehension of complex ideational material was impaired. With regard to verbal memory, encoding and acquisition of non-contextual information (i.e., word list) was low average. After a brief distracter task, free recall was average (6/9 items). After a delay, free recall was average (4/9 items). He did not benefit from semantic cueing to recall any additional items. Performance on a yes/no recognition task was average. On another verbal memory test, encoding and acquisition of contextual auditory information (i.e., short stories) was borderline. After a delay, free recall was borderline. Performance on a yes/no recognition task was mildly below expectation. With regard to non-verbal memory, delayed free recall of visual information was average. Executive functioning was variable. Mental flexibility and set-shifting were impaired; he was unable to complete Trails B. Verbal fluency with phonemic search restrictions was borderline. Verbal abstract reasoning was impaired. Performance on a clock drawing task was normal. On a self-report measure of mood, Joshua [name]'s responses were indicative of clinically significant depression at Joshua present time. Symptoms endorsed included: anhedonia, pessimism, feelings of failure, guilty feelings, disappointment in self, tearfulness, loss of interest, worthlessness, loss of energy, increased sleep, irritability, reduced appetite, concentration difficulty, fatigue and reduced libido. He denied suicidal ideation or intention. On a self-report measure of anxiety, Joshua [name] did not endorse clinically significant generalized anxiety at Joshua present time.    Clinical Impressions: Mild cognitive impairment; Major depressive disorder, single episode, moderate. Results of cognitive  testing revealed many performances commensurate with estimated baseline abilities. However, there were declines noted in verbal fluency, encoding and retrieval of auditory information (which improved significantly with repetition of to be learned information), mental flexibility/set-shifting, and verbal abstract reasoning. His performance on testing as well as his current level of daily functioning do not warrant a diagnosis of dementia. 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 His cognitive profile is more suggestive of subcortical than cortical dysfunction, and a vascular etiology is considered most likely, given his multiple vascular risk factors. Additionally, MCI is likely exacerbated by depression.     Recommendations/Plan: Based on Joshua findings of Joshua present evaluation, Joshua following recommendations are offered:  1. Optimal control of vascular risk factors is highly encouraged. We discussed what this means. 2. He was also encouraged to implement a daily/weekly schedule and routine to incorporate more social interaction and activities that allow for some movement/mobility. He may benefit from physical therapy to help teach him safe exercises he can do.  3. He is experiencing clinically significant depression. A change in antidepressant medication may be warranted. He is not interested in therapy, as he has already tried this. We spoke at length about Joshua importance of behavioral changes to improve mood. 3. Neuropsychological re-evaluation in 1-2 years would assist in monitoring cognitive function, track any progression of symptoms and further assist with treatment recommendations.   Feedback to [name]: Joshua [name] and his wife returned for a feedback appointment on 06/07/2017 to review Joshua results of his neuropsychological evaluation with this provider. 30 minutes face-to-face time was spent reviewing his test results, my impressions and my recommendations as detailed above.     Total time spent on this [name]'s case: 70 minutes for neurobehavioral status exam with psychologist (CPT code [phone]); 90 minutes of testing/scoring by psychometrician under psychologist's supervision (CPT codes [phone], [phone] units); 180 minutes for integration of [name] data, interpretation of standardized test results and clinical data, clinical decision making, treatment planning and  preparation of this report, and interactive feedback with review of results to Joshua [name]/family by psychologist (CPT codes [phone], [phone] units).      Thank you for your referral of Joshua [name]. Please feel free to contact me if you have any questions or concerns regarding this report.

## 2017-06-07 ENCOUNTER — Ambulatory Visit (INDEPENDENT_AMBULATORY_CARE_PROVIDER_SITE_OTHER): Payer: Medicare Other | Admitting: Psychology

## 2017-06-07 ENCOUNTER — Telehealth: Payer: Self-pay

## 2017-06-07 ENCOUNTER — Encounter: Payer: Self-pay | Admitting: Psychology

## 2017-06-07 DIAGNOSIS — G3184 Mild cognitive impairment, so stated: Secondary | ICD-10-CM | POA: Diagnosis not present

## 2017-06-07 DIAGNOSIS — R413 Other amnesia: Secondary | ICD-10-CM

## 2017-06-07 DIAGNOSIS — F321 Major depressive disorder, single episode, moderate: Secondary | ICD-10-CM

## 2017-06-07 NOTE — Patient Instructions (Signed)
Vascular cognitive impairment is a change in thinking skills as a result of conditions that block or reduce blood flow to various regions of the brain. To be healthy and function properly, brain cells need an adequate supply of oxygen and nutrients. Oxygen and nutrients are delivered to brain cells via the blood, which travels through a network of vessels called the vascular system. If the vascular system becomes damaged, brain cells cannot get the oxygen and nutrients they need and they will eventually die.   This decreased oxygen to the brain very often causes changes to the small vessels of the brain and can result in both subtle and more noticeable thinking and memory problems. These thinking difficulties can begin as mild changes that worsen gradually over time as a result of multiple small strokes or small vessel ischemic disease. Small vessel ischemic disease occurs when the small blood vessels that lead to the brain do not receive an adequate supply of blood over a relatively long period of time. Small vessel ischemic disease results from a history of multiple vascular risk factors (see below). It is also known by several other terms such as "microvascular disease" and "white matter disease". Common early signs of small vessel ischemic disease are reduced ability to pay attention, slowed information processing, difficulty finding the right words, and impaired planning and judgment.   There are a number of conditions that can damage the vascular system, including high blood pressure, high cholesterol, heart problems, and diabetes. Smoking, obesity, heavy alcohol use, and stress can also result in damage to the vascular system. All of these variables are referred to as "vascular risk factors." The more risk factors one has, the greater the likelihood that he or she will experience problems in thinking and memory.   Reducing Risk Any condition that damages blood vessels anywhere in the body can cause  brain changes linked to vascular cognitive impairment. However, the following strategies can help to protect the brain and reduce one's risk of cognitive decline:    . Don't smoke . Keep blood pressure, cholesterol and blood sugar within recommended limits . Engage in consistent, safe cardiovascular exercise . Maintain compliance with prescribed medications . Eat a healthy, balanced diet . Maintain a healthy weight . Limit alcohol consumption       The effect of depression and anxiety on your cognitive functioning: . One of the typical symptoms of depression is difficulty concentrating and making decisions, and various types of anxiety also interfere with attention and concentration . Problems with attention and concentration can disrupt the process of learning and making new memories, which can make it seem like there is a problem with your memory. In your daily life, you may experience this disruption as forgetting names and appointments, misplacing items, and needing to make lists for shopping and errands. It may be harder for you to stay focused on tasks and feel as "sharp" as you did in the past.  . Also, when we are depressed or anxious, we often pay more attention to our difficulties (rather than our strengths) in our daily life, and this can make it seem to [place] like we are doing worse cognitively than we really are. . The cognitive aspects of depression and anxiety are sometimes observed as an identifiable pattern of poor performance on a neuropsychological evaluation, but it is also possible that all scores on an evaluation are within normal limits. . Regardless of the test scores, distress related to depression and anxiety can interfere with the  ability to make use of your cognitive resources and function optimally across settings such as work or school, maintaining the home and responsibilities, and personal relationships. . Fortunately, there are treatments for depression and  anxiety, and when mood improves, cognitive functioning in daily life often improves. . Treatment options include psychotherapy, medications (e.g., antidepressants), and behavioral changes, such as increasing your involvement in enjoyable activities, increasing the amount of exercise you are getting, and maintaining a regular routine.

## 2017-06-07 NOTE — Telephone Encounter (Signed)
Received a letter in the mail from patient addressed to Dr. Dwyane Dee in which pt requests MD to fill out [place] patient assistance program application to get assistance with medications.  Patient has already filled out the required areas that he had to fill out, and would like MD to fill out the rest. The paperwork requests that the medication and sig be written down, but it is unclear at this time, which medication it is that patient needs assistance with. Pt was called and asked to call this office back to provide clarification so this paperwork can be filled out for pt in a timely manner.

## 2017-06-09 ENCOUNTER — Other Ambulatory Visit (HOSPITAL_COMMUNITY): Payer: Self-pay | Admitting: Cardiology

## 2017-06-09 ENCOUNTER — Other Ambulatory Visit: Payer: Self-pay

## 2017-06-09 ENCOUNTER — Other Ambulatory Visit: Payer: Self-pay | Admitting: Family Medicine

## 2017-06-09 NOTE — Patient Outreach (Signed)
[facility]) Care Management  06/09/2017  [name] 17-Aug-1940 [id number]   Medication Adherence call to Mr : Joshua [name] patient did not answer : [facility] said Losartan 25 mg is on back order, patient is getting Losartan 50 mg instead and taking 1/2 tablet : because Losartan 25 mg is on back order : Mr : Voong pick up Losartan 50 mg on 06/03/17 for a 30 days supply.[name] is showing past due under [employer].   [facility] Management Direct Dial [phone]  Fax [phone] [name]

## 2017-06-14 DIAGNOSIS — J9611 Chronic respiratory failure with hypoxia: Secondary | ICD-10-CM | POA: Diagnosis not present

## 2017-06-14 DIAGNOSIS — J449 Chronic obstructive pulmonary disease, unspecified: Secondary | ICD-10-CM | POA: Diagnosis not present

## 2017-06-14 DIAGNOSIS — I5032 Chronic diastolic (congestive) heart failure: Secondary | ICD-10-CM | POA: Diagnosis not present

## 2017-06-21 ENCOUNTER — Other Ambulatory Visit: Payer: Self-pay | Admitting: Pharmacist

## 2017-06-21 NOTE — Patient Outreach (Addendum)
[facility]) Care Management  06/21/2017  JAMEIS NEWSHAM 12/05/1940 [id number]   Incoming call from Mrs. Maland (listed in Epic under patient's Designated Party Release) in response to the [place] Medication Adherence Campaign. HIPAA identifiers verified and verbal consent received.  Mrs. Ciotti reports that she manages Mr. Banka medications for him. Reports that he takes his losartan 25 mg twice daily as directed. Denies any missed doses or barriers to mediation adherence. Reports that she uses a weekly pillbox to manage the patient's medications. Counsel on the importance of adherence to this medication.  Denies any medication questions/concerns on behalf of the patient at this time. Provide my phone number.  Will close pharmacy episode at this time.  Harlow Asa, PharmD, [facility] [phone]

## 2017-06-22 ENCOUNTER — Other Ambulatory Visit: Payer: Self-pay

## 2017-06-22 ENCOUNTER — Encounter: Payer: Self-pay | Admitting: Neurology

## 2017-06-22 ENCOUNTER — Ambulatory Visit: Payer: Medicare Other | Admitting: Neurology

## 2017-06-22 VITALS — BP 100/42 | HR 61 | Ht 71.5 in | Wt 229.0 lb

## 2017-06-22 DIAGNOSIS — G3184 Mild cognitive impairment, so stated: Secondary | ICD-10-CM | POA: Diagnosis not present

## 2017-06-22 DIAGNOSIS — F321 Major depressive disorder, single episode, moderate: Secondary | ICD-10-CM | POA: Diagnosis not present

## 2017-06-22 NOTE — Progress Notes (Signed)
NEUROLOGY FOLLOW UP OFFICE NOTE  Joshua [name] [id number] May 06, 1940  HISTORY OF PRESENT ILLNESS: I had the pleasure of seeing Joshua [name] in follow-up in the neurology clinic on 06/22/2017.  The patient was last seen 5 months ago for mild cognitive impairment. He is again accompanied by his wife who helps supplement the history today.  Records and images were personally reviewed where available.  B12 level was normal 381. I personally reviewed MRI brain without contrast which did not show any acute changes, there was mild to moderate diffuse atrophy, an old right basal ganglia infarct with ex vacuo dilatation of subjacent ventricle, right frontal lobe chronic microhemorrhage. He underwent Neuropsychological testing which showed declines in verbal fluency, encoding, retrieval of auditory information, mental flexibility/set-shifting, and verbal abstract reasoning. His performance on testing and current level of daily functioning did not warrant a diagnosis of dementia, instead mild cognitive impairment is appropriate at this time, likely vascular etiology. Also, MCI is likely exacerbated by depression.   Findings were discussed with the patient and his wife today. They have not noticed any significant changes in functioning since his last visit. He has not been able to see a psychiatrist or follow-up with a counselor due to financial constraints. He continues to drive without getting lost. He occasionally forgets his morning medication. His wife is in charge of finances. He denies any headaches, dizziness, vision changes, no falls. He usually ambulates with a cane or walker.  HPI 01/11/2017: This is a pleasant 77 yo RH man with a history of hypertension, hyperlipidemia, diabetes, CAD, CHF, COPD, paroxysmal atrial fibrillation, PAD, who presented for evaluation of memory and mood changes. A neurology referral was suggested by his psychologist, he has had moderate depression which his wife reports is  unchanged despite all the medications he is taking. He is not seeing a psychiatrist or therapist any longer. He reports his memory is "not real good, not the best." He started noticing changes a couple of years ago. His wife feels the changes started after his open heart surgery in 2015. Within a year, he was more forgetful, he would try to say something but it would take forever to get it out. He denies getting lost driving, but his wife shakes her head and reminds him of two times he went to the doctor's office and ended up on the other side of town.  His wife fixes his pillbox every week, he is overall pretty good with remembering to take them. His wife is in charge of finances. The mood changes also started around that time. He has irritability and is "just plain hateful." He does not have any patience at all. He is "easy to get disgusted." His wife states that in a lot of ways, he is a whole different person.  He denies any headaches, dizziness (except when getting up too fast), diplopia, dysarthria/dysphagia, neck/back pain, bowel/bladder dysfunction. Sleep is good. He is independent with dressing and bathing. No hallucinations or paranoia. He has tingling in both feet and takes gabapentin for diabetic neuropathy. His legs are weak, no recent falls. He has mild tremor in both hands, more noticeable when writing with his right hand. No family history of dementia. He denies any history of significant head injuries, no alcohol use.   PAST MEDICAL HISTORY: Past Medical History:  Diagnosis Date  . Acute respiratory distress 06/04/2016  . Adenomatous colon polyp 10/16/2011   Repeat 2018  . Arthritis     Hips, R>L & KNEES  .  CAD, multiple vessel    3V CAD cath 08/08/13----CABG done shortly after.  Cath 06/2016: clear bipasses/no interventional lesion.  . 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 pylori neg, no dysplasia or malignancy.  . Chronic combined systolic and  diastolic CHF, NYHA class 2 ([place]) 2016-2018   Followed by [facility].  Transesoph Echo 09/2016 EF improved to 40-45%; diffuse hypokinesis, Grd I DD.  [name] Chronic renal insufficiency, stage 3 (moderate) (HCC) 2018   GFR 50s  . COPD (chronic obstructive pulmonary disease) ([place])    GOLD II.  Spirometry  2004 borderline obstruction; 2015 mod obst: noncompliant with bid symbicort so pulmonologist switched him to a once daily inhaler: Trelegy ellipta 12/2015.  Oxygen prn: goal 88-92%.  . Diabetic nephropathy ([place])    Elevated urine microalb/cr 03/2011  . DM type 2 (diabetes mellitus, type 2) ([place])    Poor control on max oral meds--pt eventually agreed to insulin therapy.  As of 2017 his DM is being managed by Dr. Dwyane Dee in endo.  . DOE (dyspnea on exertion)    COPD + chronic diastolic HF  . Dysthymia    Worsened after 2015 heart surgery.  Memory loss +? as well.  Neuro eval 01/2017--plan is to get MRI brain, B12 level, and neuropsychiatric testing.  . Erectile dysfunction    Normal testosterone  . Hearing loss of both ears 2016   Hearing aids  . Hyperlipidemia   . Hyperplastic colon polyp 2001   No further colonoscopies to be done as of 11/2016 GI eval.  . Hypertension   . IBS (irritable bowel syndrome)    responds well to levsin  . Iron deficiency anemia 2014   03/2012 capsule endoscopy showed 2 AVMs--likely responsible for his IDA--lifetime iron supp recommended + q20moCBCs.  . Ischemic cardiomyopathy 06/2016; 09/2016   06/2016: EF 25-30%, global hypokinesis, grd II DD, vent septum motion changes c/w LBBB, mod MV regurg, L atr mod/sev dilat, R atr mod dilat, mod incr pulm press, Grd II DD.  09/2016--EF 40-45%; diffuse hypokinesis, Grd I DD.  . Macular degeneration, dry    Mild, bilat (Optometrist, [place]ife[place] [employer] [place]  . Memory impairment   . Obesity   . Open toe wound 11/2015   Wound care clinic appt made and then canceled when toe improved.  . Other  and unspecified angina pectoris   . PAD (peripheral artery disease) ([place] 02/2016   Abnormal ABI's and waveforms: LE arterial duplex ordered for f/u as per cardiologist's recommendation.  Vasc eval by Dr. CBridgett Larsson impression was minimal PAD, recommended maximize med mgmt.  .Marland KitchenPAF (paroxysmal atrial fibrillation) ([place] 10/2014   xarelto  . Pericardial effusion with cardiac tamponade 6//29/15   pericardiocentesis was done,  Infectious/inflamm (cytology showed NO MALIGNANT CELLS)  . Pleural plaque    Pleural plaques/asbestosis changes on CT chest done by pulm 05/2016.  .Marland KitchenPneumonia   . Tobacco dependence in remission    100+ pack-yr hx: quit after CABG    MEDICATIONS: Current Outpatient Medications on File Prior to Visit  Medication Sig Dispense Refill  . ACCU-CHEK GUIDE test strip USE TO CHECK BLOOD SUGAR 3 TIMES DAILY 100 each 11  . acetaminophen (TYLENOL) 325 MG tablet Take 2 tablets (650 mg total) by mouth every 4 (four) hours as needed for headache or mild pain.    . Ascorbic Acid (VITAMIN C) 1000 MG tablet Take 1,000 mg by mouth daily.    .[name]  atorvastatin (LIPITOR) 40 MG tablet Take 40 mg by mouth daily.    . bisoprolol (ZEBETA) 5 MG tablet Take 1.5 tablets (7.5 mg total) by mouth daily. 45 tablet 6  . Cholecalciferol (VITAMIN D-3) 1000 units CAPS Take 1,000 Units by mouth daily. Reported on 02/26/2015    . DIGOX 125 MCG tablet TAKE ONE TABLET BY MOUTH EVERY DAY 30 tablet 6  . fluticasone (FLONASE) 50 MCG/ACT nasal spray Place 2 sprays into both nostrils daily. 16 g 1  . gabapentin (NEURONTIN) 100 MG capsule Take 200 mg by mouth 3 (three) times daily.    [name] guaiFENesin (MUCINEX) 600 MG 12 hr tablet Take 2 tablets (1,200 mg total) by mouth 2 (two) times daily. 30 tablet 0  . hyoscyamine (LEVSIN SL) 0.125 MG SL tablet TAKE 1 TO 2 TABLETS BY MOUTH EVERY 6 HOURS AS NEEDED FOR DIARRHEA 60 tablet 3  . Icosapent Ethyl (VASCEPA) 1 g CAPS 2 capsules twice a day 120 capsule 2  . Insulin Disposable Pump  (V-GO 20) KIT Apply one Vgo 20 pump daily. 30 kit 3  . insulin regular human CONCENTRATED (HUMULIN R) 500 UNIT/ML injection INJECT 56 UNITS SUBCUTANEOUSLY DAILY IN INSULIN PUMP 20 mL 3  . insulin regular human CONCENTRATED (HUMULIN R) 500 UNIT/ML injection INJECT 56 UNITS SUBCUTANEOUSLY DAILY IN INSULIN PUMP 20 mL 3  . Insulin Syringe-Needle U-100 30G X 1/2" 1 ML MISC Use to fill insulin VGo pump one time daily 100 each 2  . isosorbide mononitrate (IMDUR) 30 MG 24 hr tablet TAKE ONE TABLET BY MOUTH EVERY DAY 90 tablet 1  . losartan (COZAAR) 25 MG tablet TAKE ONE TABLET BY MOUTH TWICE DAILY WITH A MEAL 60 tablet 3  . mirtazapine (REMERON) 45 MG tablet Take 1 tablet (45 mg total) by mouth at bedtime. 30 tablet 5  . Multiple Vitamin (MULTIVITAMIN WITH MINERALS) TABS Take 1 tablet by mouth daily.    . nitroGLYCERIN (NITROSTAT) 0.4 MG SL tablet PLACE 1 TABLET UNDER THE TONGUE EVERY 5 MINUTES AS NEEDED FOR CHEST PAIN 25 tablet 3  . OXYGEN Inhale 2 L into the lungs.    [name] PROAIR HFA 108 (90 Base) MCG/ACT inhaler INHALE 2 PUFFS INTO THE LUNGS EVERY 4 HOURS AS NEEDED FOR WHEEZING ORSHORTNESS OF BREATH 8.5 g 1  . rivaroxaban (XARELTO) 20 MG TABS tablet Take 1 tablet (20 mg total) by mouth daily with supper. 90 tablet 3  . sertraline (ZOLOFT) 25 MG tablet TAKE TWO TABLETS BY MOUTH EVERY DAY 60 tablet 3  . spironolactone (ALDACTONE) 25 MG tablet TAKE ONE TABLET BY MOUTH EVERY DAY 30 tablet 6  . SURE COMFORT INSULIN SYRINGE 31G X 5/16" 0.3 ML MISC USE TO INJECT 18 UNITS 3 TIMES DAILY 300 each 1  . SYMBICORT 160-4.5 MCG/ACT inhaler INHALE 2 PUFFS INTO THE LUNGS TWICE DAILY 10.2 g 5  . SYNJARDY 05-998 MG TABS TAKE ONE TABLET BY MOUTH TWICE DAILY AFTER A MEAL 60 tablet 2  . tamsulosin (FLOMAX) 0.4 MG CAPS capsule TAKE 1 CAPSULE BY MOUTH EVERY DAY AFTER SUPPER 90 capsule 1  . torsemide (DEMADEX) 20 MG tablet TAKE TWO TABLETS BY MOUTH EVERY DAY 60 tablet 6   No current facility-administered medications on file  prior to visit.     ALLERGIES: Allergies  Allergen Reactions  . Morphine And Related Other (See Comments)    Drenched with perspiration  . Entresto [Sacubitril-Valsartan]     hypotension  . Demerol [Meperidine] Nausea Only  . Starlix [Nateglinide]  Other (See Comments)    gassy    FAMILY HISTORY: Family History  Problem Relation Age of Onset  . Heart disease Father   . Heart attack Father   . CVA Mother   . Hypertension Mother   . Diabetes Paternal Grandmother   . Breast cancer Sister   . Diabetes Maternal Uncle   . Heart disease Maternal Uncle   . Stroke Neg Hx     SOCIAL HISTORY: Social History   Socioeconomic History  . Marital status: Married    Spouse name: [name]  . Number of children: 4  . Years of education: Not on file  . Highest education level: Not on file  Occupational History  . Occupation: retired, former Scientist, forensic  Social Needs  . Financial resource strain: Not on file  . Food insecurity:    Worry: Not on file    Inability: Not on file  . Transportation needs:    Medical: Not on file    Non-medical: Not on file  Tobacco Use  . Smoking status: Former Smoker    Packs/day: 0.25    Years: 62.00    Pack years: 15.50    Types: Cigarettes    Last attempt to quit: 10/23/2015    Years since quitting: 1.6  . Smokeless tobacco: Former Systems developer    Quit date: 06/30/2013  Substance and Sexual Activity  . Alcohol use: No    Alcohol/week: 0.0 oz  . Drug use: No  . Sexual activity: Never  Lifestyle  . Physical activity:    Days per week: Not on file    Minutes per session: Not on file  . Stress: Not on file  Relationships  . Social connections:    Talks on phone: Not on file    Gets together: Not on file    Attends religious service: Not on file    Active member of club or organization: Not on file    Attends meetings of clubs or organizations: Not on file    Relationship status: Not on file  . Intimate partner violence:    Fear of current or ex  partner: Not on file    Emotionally abused: Not on file    Physically abused: Not on file    Forced sexual activity: Not on file  Other Topics Concern  . Not on file  Social History Narrative   Married, 4 children.   Formerly a Medical illustrator.   Level of education: HS.     +tobacco--lifelong/ quit 2015).  No alcohol or drugs.   No exercise.  +excessive caffeine.      Lives in 1 story home with his wife and her sister    REVIEW OF SYSTEMS: Constitutional: No fevers, chills, or sweats, no generalized fatigue, change in appetite Eyes: No visual changes, double vision, eye pain Ear, nose and throat: No hearing loss, ear pain, nasal congestion, sore throat Cardiovascular: No chest pain, palpitations Respiratory:  No shortness of breath at rest or with exertion, wheezes GastrointestinaI: No nausea, vomiting, diarrhea, abdominal pain, fecal incontinence Genitourinary:  No dysuria, urinary retention or frequency Musculoskeletal:  No neck pain, back pain Integumentary: No rash, pruritus, skin lesions Neurological: as above Psychiatric: No depression, insomnia, anxiety Endocrine: No palpitations, fatigue, diaphoresis, mood swings, change in appetite, change in weight, increased thirst Hematologic/Lymphatic:  No anemia, purpura, petechiae. Allergic/Immunologic: no itchy/runny eyes, nasal congestion, recent allergic reactions, rashes  PHYSICAL EXAM: Vitals:   06/22/17 0827  BP: (!) 100/42  Pulse: 61  SpO2: 93%   General: No acute distress Head:  Normocephalic/atraumatic Neck: supple, no paraspinal tenderness, full range of motion Heart:  Regular rate and rhythm Lungs:  Clear to auscultation bilaterally Back: No paraspinal tenderness Skin/Extremities: No rash, no edema Neurological Exam: alert and oriented to person, place, and time. No aphasia or dysarthria. Fund of knowledge is appropriate.  Recent and remote memory are intact.  Attention and concentration are  normal.    Able to name objects and repeat phrases. Cranial nerves: Pupils equal, round, reactive to light.  Extraocular movements intact with no nystagmus. Visual fields full. Facial sensation intact. No facial asymmetry. Tongue, uvula, palate midline.  Motor: Bulk and tone normal, muscle strength 5/5 throughout with no pronator drift.  Sensation to light touch intact.  No extinction to double simultaneous stimulation.  Deep tendon reflexes +1 throughout, toes downgoing.  Finger to nose testing intact.  Gait similar to prior, limps with the left leg (stiff), chronic due to left hip issues, unable to tandem walk, ambulates better with walker  IMPRESSION: This is a 77 yo RH man with a vascular risk factors including hypertension, hyperlipidemia, diabetes, CAD, CHF, paroxysmal atrial fibrillation, PAD, with worsening memory and mood changes. His psychologist recommended a neurology referral due to refractory depression, he has tried several medications but his wife has not noticed any improvement. MRI brain no acute changes, there was mild to moderate diffuse atrophy and a small old right basal ganglia infarct. He underwent Neuropsychological testing with a diagnosis of mild cognitive impairment (MCI), likely vascular in etiology. Performance on testing and current level of daily functioning did not warrant a diagnosis of dementia. Also, MCI is likely exacerbated by depression. Findings were discussed at length with the patient and his wife, including continued to control of vascular risk factors and monitoring medication compliance. We also discussed better treatment of depression, he declines to see a psychiatrist, but his wife reports this is due to financial constraints. They were given information on [facility] to see if this is an option for them. We again discussed the importance of physical exercise and brain stimulation exercises for brain health. His gait is unsteady, he was advised to use  a cane/walker at all times, and if gait worsens, Balance therapy will be ordered. We discussed recommendation to repeat Neuropsych testing in a year, he will follow-up after the repeat evaluation.    Ellouise Newer, M.D.   CC: Dr. Anitra Lauth

## 2017-06-22 NOTE — Patient Instructions (Signed)
1. Continue control of blood pressure, cholesterol, diabetes. Start using an alarm for your medications 2. Schedule repeat Neurocognitive testing with Dr. Si Raider in 1 year 3. Consider seeing Why: [phone] 4. If any changes in balance, call our office and we will set up Physical therapy for balance theray 5. Follow-up in 1 year (after Neurocognitive testing), call for any changes  RECOMMENDATIONS FOR ALL PATIENTS WITH MEMORY PROBLEMS: 1. Continue to exercise (Recommend 30 minutes of walking everyday, or 3 hours every week) 2. Increase social interactions - continue going to [place] and enjoy social gatherings with friends and family 3. Eat healthy, avoid fried foods and eat more fruits and vegetables 4. Maintain adequate blood pressure, blood sugar, and blood cholesterol level. Reducing the risk of stroke and cardiovascular disease also helps promoting better memory. 5. Avoid stressful situations. Live a simple life and avoid aggravations. Organize your time and prepare for the next day in anticipation. 6. Sleep well, avoid any interruptions of sleep and avoid any distractions in the bedroom that may interfere with adequate sleep quality 7. Avoid sugar, avoid sweets as there is a strong link between excessive sugar intake, diabetes, and cognitive impairment The Mediterranean diet, which has been shown to help patients reduce the risk of progressive memory disorders and reduces cardiovascular risk. This includes eating fish, eat fruits and green leafy vegetables, nuts like almonds and hazelnuts, walnuts, and also use olive oil. Avoid fast foods and fried foods as much as possible. Avoid sweets and sugar as sugar use has been linked to worsening of memory function.

## 2017-06-25 ENCOUNTER — Other Ambulatory Visit: Payer: Medicare Other

## 2017-06-30 ENCOUNTER — Ambulatory Visit: Payer: Self-pay | Admitting: Endocrinology

## 2017-06-30 NOTE — Progress Notes (Deleted)
Patient ID: Joshua [name], male   DOB: October 16, 1940, 77 y.o.   MRN: [id number]            Reason for Appointment:  Follow-up for Type 2 Diabetes  Referring physician: [place]   History of Present Illness:          Date of diagnosis of type 2 diabetes mellitus: ?  2002        Background history:   He apparently had been treated with metformin initially which was continued until about 2016. He previously has also been tried on Amaryl, Actos, Farxiga and Victoza but he claims that these did not help his blood sugar and were stopped Appears to have been on insulin since about 2014 but he does not remember details. He has been taking mostly Levemir insulin as a basal insulin but was also on Lantus in 2015 for some time His best A1c has been 7.3, once in 2014 and another time in 2016, otherwise his A1c has been higher and as high as 10.8  Recent history:   INSULIN regimen U-500 insulin with V-go 20 units basal, bolus clicks: 8-5-4 at meals    Non-insulin hypoglycemic drugs the patient is taking are: Synjardy 05/998 twice a day breakfast and suppertime  His last A1c was 9.5 in January and is now 8.4   Current management, blood sugar patterns and problems identified:  He was advised to check blood sugars meals again on his last visit but he keeps forgetting to do so  FASTING blood sugars are overall higher compared to his last visit he still has a couple of low sugars in the mornings  Appears that he is sometimes forgetting to bolus before meals although otherwise taking the BOLUS clicks just before eating  He has only one reading after evening meal which was over 200  He says he had a couple of V-go pumps that did not work and blood sugar went up to 400  Also he has sporadic high readings in the mornings otherwise also and not clear why these go up  No hypoglycemia in the last few days, last hypoglycemic episode was in 04/06/17 early morning  He was told to have a protein snack at  bedtime but he is mostly having a regular boost drink with some crackers  He is still not checking his blood sugars after meals recently and mostly in the mornings    Side effects from medications have been: None  Compliance with the medical regimen: Fair Hypoglycemia: Never    Glucose monitoring:  done 2 times a day         Glucometer:  Accu-Chek .      Blood Glucose readings with averages from download   Mean values apply above for all meters except median for One Touch  PRE-MEAL Fasting Lunch Dinner Bedtime Overall  Glucose range:  56-325      Mean/median:  160     190   POST-MEAL PC Breakfast PC Lunch PC Dinner  Glucose range:   225  234  Mean/median:        Self-care: The diet that the patient has been following is: tries to limit Sweet drinks .     Meal times are:  Breakfast is at 7 a.m.  Dinner: 5-6 PM    Typical meal intake: Breakfast is usually eggs,  Sausage, oatmeal or grits.  Lunch is a sandwich, any evening he has meat and vegetables, For snacks he will have fruit or crackers Boost  at bedtime                Dietician visit, most recent: 01/09/16 CDE visit: 12/18               Exercise: Unable to do any     Weight history:  Wt Readings from Last 3 Encounters:  06/22/17 229 lb (103.9 kg)  05/25/17 228 lb (103.4 kg)  04/26/17 233 lb 6.4 oz (105.9 kg)    Glycemic control:   Lab Results  Component Value Date   HGBA1C 8.4 04/26/2017   HGBA1C 9.5 01/12/2017   HGBA1C 9.3 08/20/2016   Lab Results  Component Value Date   MICROALBUR 1.4 07/07/2016   [place] 5 02/23/2017   CREATININE 1.11 05/25/2017   Lab Results  Component Value Date   MICRALBCREAT 1.2 07/07/2016    Other active problems: See review of systems   Allergies as of 06/30/2017      Reactions   Morphine And Related Other (See Comments)   Drenched with perspiration   Entresto [sacubitril-valsartan]    hypotension   Demerol [meperidine] Nausea Only   Starlix [nateglinide] Other (See  Comments)   gassy      Medication List        Accurate as of 06/30/17  8:02 AM. Always use your most recent med list.          ACCU-CHEK GUIDE test strip Generic drug:  glucose blood USE TO CHECK BLOOD SUGAR 3 TIMES DAILY   acetaminophen 325 MG tablet Commonly known as:  TYLENOL Take 2 tablets (650 mg total) by mouth every 4 (four) hours as needed for headache or mild pain.   atorvastatin 40 MG tablet Commonly known as:  LIPITOR Take 40 mg by mouth daily.   bisoprolol 5 MG tablet Commonly known as:  ZEBETA Take 1.5 tablets (7.5 mg total) by mouth daily.   DIGOX 0.125 MG tablet Generic drug:  digoxin TAKE ONE TABLET BY MOUTH EVERY DAY   fluticasone 50 MCG/ACT nasal spray Commonly known as:  FLONASE Place 2 sprays into both nostrils daily.   gabapentin 100 MG capsule Commonly known as:  NEURONTIN Take 200 mg by mouth 3 (three) times daily.   guaiFENesin 600 MG 12 hr tablet Commonly known as:  MUCINEX Take 2 tablets (1,200 mg total) by mouth 2 (two) times daily.   hyoscyamine 0.125 MG SL tablet Commonly known as:  LEVSIN SL TAKE 1 TO 2 TABLETS BY MOUTH EVERY 6 HOURS AS NEEDED FOR DIARRHEA   Icosapent Ethyl 1 g Caps Commonly known as:  VASCEPA 2 capsules twice a day   insulin regular human CONCENTRATED 500 UNIT/ML injection Commonly known as:  HUMULIN R INJECT 56 UNITS SUBCUTANEOUSLY DAILY IN INSULIN PUMP   insulin regular human CONCENTRATED 500 UNIT/ML injection Commonly known as:  HUMULIN R INJECT 56 UNITS SUBCUTANEOUSLY DAILY IN INSULIN PUMP   Insulin Syringe-Needle U-100 30G X 1/2" 1 ML Misc Use to fill insulin VGo pump one time daily   SURE COMFORT INSULIN SYRINGE 31G X 5/16" 0.3 ML Misc Generic drug:  Insulin Syringe-Needle U-100 USE TO INJECT 18 UNITS 3 TIMES DAILY   isosorbide mononitrate 30 MG 24 hr tablet Commonly known as:  IMDUR TAKE ONE TABLET BY MOUTH EVERY DAY   losartan 25 MG tablet Commonly known as:  COZAAR TAKE ONE TABLET BY MOUTH  TWICE DAILY WITH A MEAL   mirtazapine 45 MG tablet Commonly known as:  REMERON Take 1 tablet (45 mg total) by  mouth at bedtime.   multivitamin with minerals Tabs tablet Take 1 tablet by mouth daily.   nitroGLYCERIN 0.4 MG SL tablet Commonly known as:  NITROSTAT PLACE 1 TABLET UNDER THE TONGUE EVERY 5 MINUTES AS NEEDED FOR CHEST PAIN   OXYGEN Inhale 2 L into the lungs.   PROAIR HFA 108 (90 Base) MCG/ACT inhaler Generic drug:  albuterol INHALE 2 PUFFS INTO THE LUNGS EVERY 4 HOURS AS NEEDED FOR WHEEZING ORSHORTNESS OF BREATH   rivaroxaban 20 MG Tabs tablet Commonly known as:  XARELTO Take 1 tablet (20 mg total) by mouth daily with supper.   sertraline 25 MG tablet Commonly known as:  ZOLOFT TAKE TWO TABLETS BY MOUTH EVERY DAY   spironolactone 25 MG tablet Commonly known as:  ALDACTONE TAKE ONE TABLET BY MOUTH EVERY DAY   SYMBICORT 160-4.5 MCG/ACT inhaler Generic drug:  budesonide-formoterol INHALE 2 PUFFS INTO THE LUNGS TWICE DAILY   SYNJARDY 05-998 MG Tabs Generic drug:  Empagliflozin-metFORMIN HCl TAKE ONE TABLET BY MOUTH TWICE DAILY AFTER A MEAL   tamsulosin 0.4 MG Caps capsule Commonly known as:  FLOMAX TAKE 1 CAPSULE BY MOUTH EVERY DAY AFTER SUPPER   torsemide 20 MG tablet Commonly known as:  DEMADEX TAKE TWO TABLETS BY MOUTH EVERY DAY   V-GO 20 Kit Apply one Vgo 20 pump daily.   vitamin C 1000 MG tablet Take 1,000 mg by mouth daily.   Vitamin D-3 1000 units Caps Take 1,000 Units by mouth daily. Reported on 02/26/2015       Allergies:  Allergies  Allergen Reactions  . Morphine And Related Other (See Comments)    Drenched with perspiration  . Entresto [Sacubitril-Valsartan]     hypotension  . Demerol [Meperidine] Nausea Only  . Starlix [Nateglinide] Other (See Comments)    gassy    Past Medical History:  Diagnosis Date  . Acute respiratory distress 06/04/2016  . Adenomatous colon polyp 10/16/2011   Repeat 2018  . Arthritis     Hips, R>L &  KNEES  . CAD, multiple vessel    3V CAD cath 08/08/13----CABG done shortly after.  Cath 06/2016: clear bipasses/no interventional lesion.  . 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 pylori neg, no dysplasia or malignancy.  . Chronic combined systolic and diastolic CHF, NYHA class 2 ([place]) 2016-2018   Followed by [facility].  Trans[facility] 09/2016 EF improved to 40-45%; diffuse hypokinesis, Grd I DD.  [name] Chronic renal insufficiency, stage 3 (moderate) ([facility]) 2018   GFR 50s  . COPD (chronic obstructive pulmonary disease) ([place])    GOLD II.  Spirometry  2004 borderline obstruction; 2015 mod obst: noncompliant with bid symbicort so pulmonologist switched him to a once daily inhaler: Trelegy ellipta 12/2015.  Oxygen prn: goal 88-92%.  . Diabetic nephropathy ([place])    Elevated urine microalb/cr 03/2011  . DM type 2 (diabetes mellitus, type 2) ([place])    Poor control on max oral meds--pt eventually agreed to insulin therapy.  As of 2017 his DM is being managed by Dr. Dwyane Dee in endo.  . DOE (dyspnea on exertion)    COPD + chronic diastolic HF  . Dysthymia    Worsened after 2015 heart surgery.  Memory loss +? as well.  Neuro eval 01/2017--plan is to get MRI brain, B12 level, and neuropsychiatric testing.  . Erectile dysfunction    Normal testosterone  . Hearing loss of both ears 2016   Hearing aids  . Hyperlipidemia   . Hyperplastic  colon polyp 2001   No further colonoscopies to be done as of 11/2016 GI eval.  . Hypertension   . IBS (irritable bowel syndrome)    responds well to levsin  . Iron deficiency anemia 2014   03/2012 capsule endoscopy showed 2 AVMs--likely responsible for his IDA--lifetime iron supp recommended + q63moCBCs.  . Ischemic cardiomyopathy 06/2016; 09/2016   06/2016: EF 25-30%, global hypokinesis, grd II DD, vent septum motion changes c/w LBBB, mod MV regurg, L atr mod/sev dilat, R atr mod dilat, mod incr pulm press, Grd II DD.  09/2016--EF  40-45%; diffuse hypokinesis, Grd I DD.  . Macular degeneration, dry    Mild, bilat (Optometrist, [place]ford Knifeat [place] [employer] [place]  . Memory impairment   . Obesity   . Open toe wound 11/2015   Wound care clinic appt made and then canceled when toe improved.  . Other and unspecified angina pectoris   . PAD (peripheral artery disease) ([place] 02/2016   Abnormal ABI's and waveforms: LE arterial duplex ordered for f/u as per cardiologist's recommendation.  Vasc eval by Dr. CBridgett Larsson impression was minimal PAD, recommended maximize med mgmt.  .Marland KitchenPAF (paroxysmal atrial fibrillation) ([place] 10/2014   xarelto  . Pericardial effusion with cardiac tamponade 6//29/15   pericardiocentesis was done,  Infectious/inflamm (cytology showed NO MALIGNANT CELLS)  . Pleural plaque    Pleural plaques/asbestosis changes on CT chest done by pulm 05/2016.  .Marland KitchenPneumonia   . Tobacco dependence in remission    100+ pack-yr hx: quit after CABG    Past Surgical History:  Procedure Laterality Date  . APPENDECTOMY  1957  . CARDIAC CATHETERIZATION  08/08/2013  . CATARACT EXTRACTION W/ INTRAOCULAR LENS  IMPLANT, BILATERAL  04/08/2006 & 04/22/2006  . CATARACT EXTRACTION W/ INTRAOCULAR LENS  IMPLANT, BILATERAL Bilateral   . CHOLECYSTECTOMY OPEN  1999  . COLONOSCOPY  10/16/2011   Procedure: COLONOSCOPY;  Surgeon: RInda Castle MD;  Location: [facility];  Service: Endoscopy;  Laterality: N/A;  . CORONARY ARTERY BYPASS GRAFT N/A 08/14/2013   Procedure: CORONARY ARTERY BYPASS GRAFTING (CABG);  Surgeon: SMelrose Nakayama MD;  [place] [facility]  Service: Open Heart Surgery;  Laterality: N/A;  Times 4   using left internal mammary artery and endoscopically harvested bilateral saphenous vein  . ESOPHAGOGASTRODUODENOSCOPY  02/25/12   Atrophic gastritis with a few erosions--capsule endoscopy planned as of 02/25/12 (Dr. KDeatra Ina.  .Marland KitchenHARDWARE REMOVAL Right 08/12/2012   Procedure: HARDWARE REMOVAL;  Surgeon: CMcarthur Rossetti MD;  Location: [facility];  Service: Orthopedics;  Laterality: Right;  . HIP SURGERY Right 1954   Repair of slipped capital femoral epiphysis.  . INTRAOPERATIVE TRANSESOPHAGEAL ECHOCARDIOGRAM N/A 08/14/2013   Normal LV function. Procedure: INTRAOPERATIVE TRANSESOPHAGEAL ECHOCARDIOGRAM;  Surgeon: SMelrose Nakayama MD;  [facility]  Service: Open Heart Surgery;  Laterality: N/A;  . LEFT HEART CATHETERIZATION WITH CORONARY ANGIOGRAM N/A 08/08/2013   Procedure: LEFT HEART CATHETERIZATION WITH CORONARY ANGIOGRAM;  Surgeon: JJettie Booze MD;  [facility];  Service: Cardiovascular;  Laterality: N/A;  . PATELLA FRACTURE SURGERY Left ~ 1979   bolt + 3 screws to repair tib plateau fx  . PERICARDIAL TAP N/A 07/01/2013   Procedure: PERICARDIAL TAP;  Surgeon: JJettie Booze MD;  [facility];  Service: Cardiovascular;  Laterality: N/A;  . RIGHT/LEFT HEART CATH AND CORONARY ANGIOGRAPHY N/A 06/10/2016   Procedure: Right/Left Heart Cath and Coronary Angiography;  Surgeon: MLarey Dresser MD;  Location: [place] [facility];  Service: Cardiovascular;  Laterality: N/A;  . TESTICLE SURGERY  as a child   Undescended testicle brought down into scrotum  . TONSILLECTOMY  1947  . TOTAL HIP ARTHROPLASTY Right 08/12/2012   Procedure: REMOVAL OF OLD PINS RIGHT HIP AND RIGHT TOTAL HIP ARTHROPLASTY ANTERIOR APPROACH;  Surgeon: Mcarthur Rossetti, MD;  [facility]: [facility];  Service: Orthopedics;  Laterality: Right;  . TRANSESOPHAGEAL ECHOCARDIOGRAM  09/22/2016   EF 40-45%; diffuse hypokinesis, Grd I DD.  [name] TRANSTHORACIC ECHOCARDIOGRAM  10/30/14   Mod LVH, EF 60-65%, normal wall motion, mod mitral regurg, mild PAH  . TRANSTHORACIC ECHOCARDIOGRAM  06/2016   06/2016: EF 25-30%, global hypokinesis, grd II DD, vent septum motion changes c/w LBBB, mod MV regurg, L atr mod/sev dilat, R atr mod dilat, mod incr pulm press, Grd II DD.    Family History  Problem Relation Age of Onset  .  Heart disease Father   . Heart attack Father   . CVA Mother   . Hypertension Mother   . Diabetes Paternal Grandmother   . Breast cancer Sister   . Diabetes Maternal Uncle   . Heart disease Maternal Uncle   . Stroke Neg Hx     Social History:  reports that he quit smoking about 20 months ago. His smoking use included cigarettes. He has a 15.50 pack-year smoking history. He quit smokeless tobacco use about 4 years ago. He reports that he does not drink alcohol or use drugs.   Review of Systems   Lipid history: He had been prescribed Lipitor 40 mg Has had good control of LDL but he has increased triglycerides Not clear Vascepa is covered by his insurance    Lab Results  Component Value Date   CHOL 121 04/26/2017   HDL 31.80 (L) 04/26/2017   LDLCALC 5 02/23/2017   LDLDIRECT 47.0 04/26/2017   TRIG 397.0 (H) 04/26/2017   CHOLHDL 4 04/26/2017           Hypertension:Mild and controlled with bisoprolol 5 mg,25 mg losartan He is on diuretics for CHF  Followed by cardiologist His last potassium was high and he was told to have it repeated by PCP   BP Readings from Last 3 Encounters:  06/22/17 (!) 100/42  05/25/17 (!) 116/48  04/26/17 126/70     Most recent eye exam was 02/2017, has had no retinopathy  Most recent foot exam: 12/17   LABS:  No visits with results within 1 Week(s) from this visit.  Latest known visit with results is:  Hospital Outpatient Visit on 05/25/2017  Component Date Value Ref Range Status  . Sodium 05/25/2017 141  135 - 145 mmol/L Final  . Potassium 05/25/2017 4.5  3.5 - 5.1 mmol/L Final  . Chloride 05/25/2017 104  101 - 111 mmol/L Final  . CO2 05/25/2017 26  22 - 32 mmol/L Final  . Glucose, Bld 05/25/2017 101* 65 - 99 mg/dL Final  . BUN 05/25/2017 23* 6 - 20 mg/dL Final  . Creatinine, Ser 05/25/2017 1.11  0.61 - 1.24 mg/dL Final  . Calcium 05/25/2017 9.1  8.9 - 10.3 mg/dL Final  . GFR calc non Af Amer 05/25/2017 >60  >60 mL/min Final  . GFR  calc Af Amer 05/25/2017 >60  >60 mL/min Final   Comment: (NOTE) The eGFR has been calculated using the CKD EPI equation. This calculation has not been validated in all clinical situations. eGFR's persistently <60 mL/min signify possible Chronic Kidney Disease.   [name]  Anion gap 05/25/2017 11  5 - 15 Final   Performed at [facility] [address] [place], [place] [address]  . Digoxin Level 05/25/2017 0.6* 0.8 - 2.0 ng/mL Final   Performed at [facility], [place] [address] [place], [place] [address]    Physical Examination:  There were no vitals taken for this visit.      ASSESSMENT:  Diabetes type 2, uncontrolled on insulin    See history of present illness for detailed discussion of current diabetes management, blood sugar patterns and problems identified  A1c is better but still high at 8.4  With his not taking his blood sugars after meals difficult to determine how consistently high his blood sugars are after meals also not clear why his fasting readings are variable His compliance with mealtime insulin is also not consistent does not bolus 30 minutes before eating as directed Because of variability in his diet he may have variable postprandial readings but he is not able to understand adjusting the mealtime dose based on carbohydrates or meal size He thinks that some of his very high readings are related to malfunction of his V-go pump  Hypoglycemia has been minimal and not recently, mostly early morning  LIPIDS: Has history of high triglycerides and will prescribe Vascepa since this is still not on his list Will recheck his triglycerides today since he is fasting  PLAN:    He will check blood sugars at bedtime when he takes his medications We will try to avoid high carbohydrate drinks such as boost and have only a protein snack at bedtime If his blood sugars are consistently low normal at bedtime he will use 5 clicks at dinnertime and not 6 Try to bolus 30  minutes before eating Consider consultation with dietitian again Call us if he is having excessive hypoglycemia Sugar targets at various times were discussed He will change his Synjardy timing to lunch and breakfast and not take it at dinnertime  There are no Patient Instructions on file for this visit.      Elayne Snare 06/30/2017, 8:02 AM   Note: This office note was prepared with Dragon voice recognition system technology. Any transcriptional errors that result from this process are unintentional.    Elayne Snare

## 2017-07-01 ENCOUNTER — Ambulatory Visit: Payer: Medicare Other | Admitting: Endocrinology

## 2017-07-01 ENCOUNTER — Encounter: Payer: Self-pay | Admitting: Endocrinology

## 2017-07-01 VITALS — BP 150/60 | HR 68 | Ht 71.5 in | Wt 232.4 lb

## 2017-07-01 DIAGNOSIS — E782 Mixed hyperlipidemia: Secondary | ICD-10-CM | POA: Diagnosis not present

## 2017-07-01 DIAGNOSIS — Z794 Long term (current) use of insulin: Secondary | ICD-10-CM

## 2017-07-01 DIAGNOSIS — E1165 Type 2 diabetes mellitus with hyperglycemia: Secondary | ICD-10-CM

## 2017-07-01 LAB — COMPREHENSIVE METABOLIC PANEL
ALK PHOS: 68 U/L (ref 39–117)
ALT: 15 U/L (ref 0–53)
AST: 16 U/L (ref 0–37)
Albumin: 4.1 g/dL (ref 3.5–5.2)
BUN: 28 mg/dL — AB (ref 6–23)
CO2: 29 mEq/L (ref 19–32)
Calcium: 9.5 mg/dL (ref 8.4–10.5)
Chloride: 106 mEq/L (ref 96–112)
Creatinine, Ser: 1.04 mg/dL (ref 0.40–1.50)
GFR: 73.37 mL/min (ref >60.00)
Glucose, Bld: 258 mg/dL — ABNORMAL HIGH (ref 70–99)
POTASSIUM: 5.1 meq/L (ref 3.5–5.1)
Sodium: 142 mEq/L (ref 135–145)
TOTAL PROTEIN: 6.8 g/dL (ref 6.0–8.3)
Total Bilirubin: 0.3 mg/dL (ref 0.2–1.2)

## 2017-07-01 NOTE — Patient Instructions (Signed)
Click 30 min before each meal  If sugar stay > 200 after lunch use 6 clicks at lunch  [place]

## 2017-07-01 NOTE — Progress Notes (Signed)
Patient ID: Joshua [name], male   DOB: 1940-09-09, 78 y.o.   MRN: [id number]            Reason for Appointment:  Follow-up for Type 2 Diabetes  Referring physician: [place]   History of Present Illness:          Date of diagnosis of type 2 diabetes mellitus: ?  2002        Background history:   He apparently had been treated with metformin initially which was continued until about 2016. He previously has also been tried on Amaryl, Actos, Farxiga and Victoza but he claims that these did not help his blood sugar and were stopped Appears to have been on insulin since about 2014 but he does not remember details. He has been taking mostly Levemir insulin as a basal insulin but was also on Lantus in 2015 for some time His best A1c has been 7.3, once in 2014 and another time in 2016, otherwise his A1c has been higher and as high as 10.8  Recent history:   INSULIN regimen U-500 insulin with V-go 20 units basal, bolus clicks: 4-0-8 at meals    Non-insulin hypoglycemic drugs the patient is taking are: Synjardy 05/998 twice a day breakfast and suppertime  His last A1c was 9.5 in January and is 8.4 in April   Current management, blood sugar patterns and problems identified:  He still not checking readings after meals despite repeated reminders  Also he was advised to cut back on his suppertime bolus especially if eating smaller meal or if his blood sugars at night are lower before bedtime but he has not made any changes  BOLUSES are still being done right after eating instead of 30 minutes before despite reminders  FASTING blood sugars are on an average better than before and has no blood sugars below 60, has had to recently had a reading of 61 which was asymptomatic on waking up  Again however he has variable readings in the mornings and not clear why  After lunch he had  reading twice of over 200 last month but not clear why since he only sometimes has a sandwich at that  time  Evening blood sugars have been checked 3 times with only one reading over 200  No hypoglycemia at any other time  He is still concerned about the cost of his insulin and may not be getting coverage for this through the company on the indigent program  He thinks he is using up all his insulin when he replaces his pump daily even though he is not getting 18 clicks per day    Side effects from medications have been: None  Compliance with the medical regimen: Fair Hypoglycemia: Never    Glucose monitoring:  done 2 times a day         Glucometer:  Accu-Chek .      Blood Glucose readings with averages from download   Mean values apply above for all meters except median for One Touch  PRE-MEAL Fasting Lunch Dinner Bedtime Overall  Glucose range:  61-218      Mean/median:  125     142   POST-MEAL PC Breakfast PC Lunch PC Dinner  Glucose range:   208, 243  163-248  Mean/median:    199   Previous readings:  Mean values apply above for all meters except median for One Touch  PRE-MEAL Fasting Lunch Dinner Bedtime Overall  Glucose range:  56-325  Mean/median:  160     190   POST-MEAL PC Breakfast PC Lunch PC Dinner  Glucose range:   225  234  Mean/median:        Self-care: The diet that the patient has been following is: tries to limit Sweet drinks .     Meal times are:  Breakfast is at 7 a.m.  Dinner: 5-6 PM    Typical meal intake: Breakfast is usually eggs,  Sausage, oatmeal or grits.  Lunch is a sandwich, any evening he has meat and vegetables, For snacks he will have fruit or crackers Boost at bedtime                Dietician visit, most recent: 01/09/16 CDE visit: 12/18               Exercise: Unable to do any     Weight history:  Wt Readings from Last 3 Encounters:  07/01/17 232 lb 6.4 oz (105.4 kg)  06/22/17 229 lb (103.9 kg)  05/25/17 228 lb (103.4 kg)    Glycemic control:   Lab Results  Component Value Date   HGBA1C 8.4 04/26/2017   HGBA1C 9.5  01/12/2017   HGBA1C 9.3 08/20/2016   Lab Results  Component Value Date   MICROALBUR 1.4 07/07/2016   [place] 5 02/23/2017   CREATININE 1.04 07/01/2017   Lab Results  Component Value Date   MICRALBCREAT 1.2 07/07/2016    Other active problems: See review of systems   Allergies as of 07/01/2017      Reactions   Morphine And Related Other (See Comments)   Drenched with perspiration   Entresto [sacubitril-valsartan]    hypotension   Demerol [meperidine] Nausea Only   Starlix [nateglinide] Other (See Comments)   gassy      Medication List        Accurate as of 07/01/17 12:45 PM. Always use your most recent med list.          ACCU-CHEK GUIDE test strip Generic drug:  glucose blood USE TO CHECK BLOOD SUGAR 3 TIMES DAILY   acetaminophen 325 MG tablet Commonly known as:  TYLENOL Take 2 tablets (650 mg total) by mouth every 4 (four) hours as needed for headache or mild pain.   atorvastatin 40 MG tablet Commonly known as:  LIPITOR Take 40 mg by mouth daily.   bisoprolol 5 MG tablet Commonly known as:  ZEBETA Take 1.5 tablets (7.5 mg total) by mouth daily.   DIGOX 0.125 MG tablet Generic drug:  digoxin TAKE ONE TABLET BY MOUTH EVERY DAY   fluticasone 50 MCG/ACT nasal spray Commonly known as:  FLONASE Place 2 sprays into both nostrils daily.   gabapentin 100 MG capsule Commonly known as:  NEURONTIN Take 200 mg by mouth 3 (three) times daily.   guaiFENesin 600 MG 12 hr tablet Commonly known as:  MUCINEX Take 2 tablets (1,200 mg total) by mouth 2 (two) times daily.   hyoscyamine 0.125 MG SL tablet Commonly known as:  LEVSIN SL TAKE 1 TO 2 TABLETS BY MOUTH EVERY 6 HOURS AS NEEDED FOR DIARRHEA   Icosapent Ethyl 1 g Caps Commonly known as:  VASCEPA 2 capsules twice a day   insulin regular human CONCENTRATED 500 UNIT/ML injection Commonly known as:  HUMULIN R INJECT 56 UNITS SUBCUTANEOUSLY DAILY IN INSULIN PUMP   isosorbide mononitrate 30 MG 24 hr  tablet Commonly known as:  IMDUR TAKE ONE TABLET BY MOUTH EVERY DAY   losartan 25 MG tablet  Commonly known as:  COZAAR TAKE ONE TABLET BY MOUTH TWICE DAILY WITH A MEAL   mirtazapine 45 MG tablet Commonly known as:  REMERON Take 1 tablet (45 mg total) by mouth at bedtime.   multivitamin with minerals Tabs tablet Take 1 tablet by mouth daily.   nitroGLYCERIN 0.4 MG SL tablet Commonly known as:  NITROSTAT PLACE 1 TABLET UNDER THE TONGUE EVERY 5 MINUTES AS NEEDED FOR CHEST PAIN   OXYGEN Inhale 2 L into the lungs.   PROAIR HFA 108 (90 Base) MCG/ACT inhaler Generic drug:  albuterol INHALE 2 PUFFS INTO THE LUNGS EVERY 4 HOURS AS NEEDED FOR WHEEZING ORSHORTNESS OF BREATH   rivaroxaban 20 MG Tabs tablet Commonly known as:  XARELTO Take 1 tablet (20 mg total) by mouth daily with supper.   sertraline 25 MG tablet Commonly known as:  ZOLOFT TAKE TWO TABLETS BY MOUTH EVERY DAY   spironolactone 25 MG tablet Commonly known as:  ALDACTONE TAKE ONE TABLET BY MOUTH EVERY DAY   SYMBICORT 160-4.5 MCG/ACT inhaler Generic drug:  budesonide-formoterol INHALE 2 PUFFS INTO THE LUNGS TWICE DAILY   SYNJARDY 05-998 MG Tabs Generic drug:  Empagliflozin-metFORMIN HCl TAKE ONE TABLET BY MOUTH TWICE DAILY AFTER A MEAL   tamsulosin 0.4 MG Caps capsule Commonly known as:  FLOMAX TAKE 1 CAPSULE BY MOUTH EVERY DAY AFTER SUPPER   torsemide 20 MG tablet Commonly known as:  DEMADEX TAKE TWO TABLETS BY MOUTH EVERY DAY   V-GO 20 Kit Apply one Vgo 20 pump daily.   vitamin C 1000 MG tablet Take 1,000 mg by mouth daily.   Vitamin D-3 1000 units Caps Take 1,000 Units by mouth daily. Reported on 02/26/2015       Allergies:  Allergies  Allergen Reactions  . Morphine And Related Other (See Comments)    Drenched with perspiration  . Entresto [Sacubitril-Valsartan]     hypotension  . Demerol [Meperidine] Nausea Only  . Starlix [Nateglinide] Other (See Comments)    gassy    Past Medical  History:  Diagnosis Date  . Acute respiratory distress 06/04/2016  . Adenomatous colon polyp 10/16/2011   Repeat 2018  . Arthritis     Hips, R>L & KNEES  . CAD, multiple vessel    3V CAD cath 08/08/13----CABG done shortly after.  Cath 06/2016: clear bipasses/no interventional lesion.  . 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 pylori neg, no dysplasia or malignancy.  . Chronic combined systolic and diastolic CHF, NYHA class 2 ([place]) 2016-2018   Followed by [facility].  Transesoph Echo 09/2016 EF improved to 40-45%; diffuse hypokinesis, Grd I DD.  [name] Chronic renal insufficiency, stage 3 (moderate) (HCC) 2018   GFR 50s  . COPD (chronic obstructive pulmonary disease) ([place])    GOLD II.  Spirometry  2004 borderline obstruction; 2015 mod obst: noncompliant with bid symbicort so pulmonologist switched him to a once daily inhaler: Trelegy ellipta 12/2015.  Oxygen prn: goal 88-92%.  . Diabetic nephropathy ([place])    Elevated urine microalb/cr 03/2011  . DM type 2 (diabetes mellitus, type 2) ([place])    Poor control on max oral meds--pt eventually agreed to insulin therapy.  As of 2017 his DM is being managed by Dr. Dwyane Dee in endo.  . DOE (dyspnea on exertion)    COPD + chronic diastolic HF  . Dysthymia    Worsened after 2015 heart surgery.  Memory loss +? as well.  Neuro eval 01/2017--plan is to get MRI brain,  B12 level, and neuropsychiatric testing.  . Erectile dysfunction    Normal testosterone  . Hearing loss of both ears 2016   Hearing aids  . Hyperlipidemia   . Hyperplastic colon polyp 2001   No further colonoscopies to be done as of 11/2016 GI eval.  . Hypertension   . IBS (irritable bowel syndrome)    responds well to levsin  . Iron deficiency anemia 2014   03/2012 capsule endoscopy showed 2 AVMs--likely responsible for his IDA--lifetime iron supp recommended + q1moCBCs.  . Ischemic cardiomyopathy 06/2016; 09/2016   06/2016: EF 25-30%, global  hypokinesis, grd II DD, vent septum motion changes c/w LBBB, mod MV regurg, L atr mod/sev dilat, R atr mod dilat, mod incr pulm press, Grd II DD.  09/2016--EF 40-45%; diffuse hypokinesis, Grd I DD.  . Macular degeneration, dry    Mild, bilat (Optometrist, DMayford Knifeat MKinder [employer]of NSan [place]  . Memory impairment   . Obesity   . Open toe wound 11/2015   Wound care clinic appt made and then canceled when toe improved.  . Other and unspecified angina pectoris   . PAD (peripheral artery disease) ([place] 02/2016   Abnormal ABI's and waveforms: LE arterial duplex ordered for f/u as per cardiologist's recommendation.  Vasc eval by Dr. CBridgett Larsson impression was minimal PAD, recommended maximize med mgmt.  .Marland KitchenPAF (paroxysmal atrial fibrillation) ([place] 10/2014   xarelto  . Pericardial effusion with cardiac tamponade 6//29/15   pericardiocentesis was done,  Infectious/inflamm (cytology showed NO MALIGNANT CELLS)  . Pleural plaque    Pleural plaques/asbestosis changes on CT chest done by pulm 05/2016.  .Marland KitchenPneumonia   . Tobacco dependence in remission    100+ pack-yr hx: quit after CABG    Past Surgical History:  Procedure Laterality Date  . APPENDECTOMY  1957  . CARDIAC CATHETERIZATION  08/08/2013  . CATARACT EXTRACTION W/ INTRAOCULAR LENS  IMPLANT, BILATERAL  04/08/2006 & 04/22/2006  . CATARACT EXTRACTION W/ INTRAOCULAR LENS  IMPLANT, BILATERAL Bilateral   . CHOLECYSTECTOMY OPEN  1999  . COLONOSCOPY  10/16/2011   Procedure: COLONOSCOPY;  Surgeon: RInda Castle MD;  [facility];  Service: Endoscopy;  Laterality: N/A;  . CORONARY ARTERY BYPASS GRAFT N/A 08/14/2013   Procedure: CORONARY ARTERY BYPASS GRAFTING (CABG);  Surgeon: SMelrose Nakayama MD;  [place] [facility]  Service: Open Heart Surgery;  Laterality: N/A;  Times 4   using left internal mammary artery and endoscopically harvested bilateral saphenous vein  . ESOPHAGOGASTRODUODENOSCOPY  02/25/12   Atrophic gastritis with a  few erosions--capsule endoscopy planned as of 02/25/12 (Dr. KDeatra Ina.  .Marland KitchenHARDWARE REMOVAL Right 08/12/2012   Procedure: HARDWARE REMOVAL;  Surgeon: CMcarthur Rossetti MD;  [facility]: [facility];  Service: Orthopedics;  Laterality: Right;  . HIP SURGERY Right 1954   Repair of slipped capital femoral epiphysis.  . INTRAOPERATIVE TRANSESOPHAGEAL ECHOCARDIOGRAM N/A 08/14/2013   Normal LV function. Procedure: INTRAOPERATIVE TRANSESOPHAGEAL ECHOCARDIOGRAM;  Surgeon: SMelrose Nakayama MD;  [place] [facility]  Service: Open Heart Surgery;  Laterality: N/A;  . LEFT HEART CATHETERIZATION WITH CORONARY ANGIOGRAM N/A 08/08/2013   Procedure: LEFT HEART CATHETERIZATION WITH CORONARY ANGIOGRAM;  Surgeon: JJettie Booze MD;  [facility];  Service: Cardiovascular;  Laterality: N/A;  . PATELLA FRACTURE SURGERY Left ~ 1979   bolt + 3 screws to repair tib plateau fx  . PERICARDIAL TAP N/A 07/01/2013   Procedure: PERICARDIAL TAP;  Surgeon: JJettie Booze MD;  [facility];  Service: Cardiovascular;  Laterality: N/A;  . RIGHT/LEFT HEART CATH AND CORONARY ANGIOGRAPHY N/A 06/10/2016   Procedure: Right/Left Heart Cath and Coronary Angiography;  Surgeon: Larey Dresser, MD;  Location: [place] CV LAB;  Service: Cardiovascular;  Laterality: N/A;  . TESTICLE SURGERY  as a child   Undescended testicle brought down into scrotum  . TONSILLECTOMY  1947  . TOTAL HIP ARTHROPLASTY Right 08/12/2012   Procedure: REMOVAL OF OLD PINS RIGHT HIP AND RIGHT TOTAL HIP ARTHROPLASTY ANTERIOR APPROACH;  Surgeon: Mcarthur Rossetti, MD;  [facility];  Service: Orthopedics;  Laterality: Right;  . TRANSESOPHAGEAL ECHOCARDIOGRAM  09/22/2016   EF 40-45%; diffuse hypokinesis, Grd I DD.  [name] TRANSTHORACIC ECHOCARDIOGRAM  10/30/14   Mod LVH, EF 60-65%, normal wall motion, mod mitral regurg, mild PAH  . TRANSTHORACIC ECHOCARDIOGRAM  06/2016   06/2016: EF 25-30%, global hypokinesis, grd II DD, vent septum motion  changes c/w LBBB, mod MV regurg, L atr mod/sev dilat, R atr mod dilat, mod incr pulm press, Grd II DD.    Family History  Problem Relation Age of Onset  . Heart disease Father   . Heart attack Father   . CVA Mother   . Hypertension Mother   . Diabetes Paternal Grandmother   . Breast cancer Sister   . Diabetes Maternal Uncle   . Heart disease Maternal Uncle   . Stroke Neg Hx     Social History:  reports that he quit smoking about 20 months ago. His smoking use included cigarettes. He has a 15.50 pack-year smoking history. He quit smokeless tobacco use about 4 years ago. He reports that he does not drink alcohol or use drugs.   Review of Systems   Lipid history: He had been prescribed Lipitor 40 mg Has had good control of LDL but he has increased triglycerides  On Vascepa since his last visit and able to get this from insurance coverage    Lab Results  Component Value Date   CHOL 121 04/26/2017   HDL 31.80 (L) 04/26/2017   LDLCALC 5 02/23/2017   LDLDIRECT 47.0 04/26/2017   TRIG 397.0 (H) 04/26/2017   CHOLHDL 4 04/26/2017           Hypertension:Mild and controlled with bisoprolol 5 mg,25 mg losartan He is on diuretics for CHF  Followed by cardiologist Has high normal potassium   BP Readings from Last 3 Encounters:  07/01/17 (!) 150/60  06/22/17 (!) 100/42  05/25/17 (!) 116/48     Most recent eye exam was 02/2017, has had no retinopathy  Most recent foot exam: 12/17   LABS:  Office Visit on 07/01/2017  Component Date Value Ref Range Status  . Sodium 07/01/2017 142  135 - 145 mEq/L Final  . Potassium 07/01/2017 5.1  3.5 - 5.1 mEq/L Final  . Chloride 07/01/2017 106  96 - 112 mEq/L Final  . CO2 07/01/2017 29  19 - 32 mEq/L Final  . Glucose, Bld 07/01/2017 258* 70 - 99 mg/dL Final  . BUN 07/01/2017 28* 6 - 23 mg/dL Final  . Creatinine, Ser 07/01/2017 1.04  0.40 - 1.50 mg/dL Final  . Total Bilirubin 07/01/2017 0.3  0.2 - 1.2 mg/dL Final  . Alkaline  Phosphatase 07/01/2017 68  39 - 117 U/L Final  . AST 07/01/2017 16  0 - 37 U/L Final  . ALT 07/01/2017 15  0 - 53 U/L Final  . Total Protein 07/01/2017 6.8  6.0 - 8.3 g/dL Final  .  Albumin 07/01/2017 4.1  3.5 - 5.2 g/dL Final  . Calcium 07/01/2017 9.5  8.4 - 10.5 mg/dL Final  . GFR 07/01/2017 73.37  >60.00 mL/min Final    Physical Examination:  BP (!) 150/60 (BP Location: Left Arm, Patient Position: Sitting, Cuff Size: Normal)   Pulse 68   Ht 5' 11.5" (1.816 m)   Wt 232 lb 6.4 oz (105.4 kg)   SpO2 94%   BMI 31.96 kg/m       ASSESSMENT:  Diabetes type 2, uncontrolled, on the V-go insulin pump with U-500 insulin  See history of present illness for detailed discussion of current diabetes management, blood sugar patterns and problems identified  His last A1c was 8.4  Most likely he continues to have postprandial hyperglycemia which he does not monitor Has only a couple of good readings after meals which have been documented but not clear if high readings are consistent after meals Also fasting readings are variable for unknown reasons Since his fasting readings can be as low as 61 he is on the appropriate basal rate for his pump Currently not adjusting his boluses based on his meal size or pre-meal blood sugar Also has a problem with not remembering to bolus 30 minutes before eating and bolusing after eating despite using the U-500 insulin  LIPIDS: Has history of high triglycerides and will need to get fasting labs with his PCP, he was not fasting today in the office  PLAN:    He will need to try and bolus 30 minutes before eating when he is preparing his meal and he needs to have his wife help him remember this Check blood sugars at least every other day after 1 of his meals or at bedtime Use 6 clicks after lunch if he sees his blood sugars consistently high after this meal Have a protein at lunch daily Check labs today for chemistry panel Discussed blood sugar targets both  fasting and postprandial Again he can try and get help with his insulin cost from the company indigent program, paperwork filled out  He can get his fasting lipids done from his PCP in August Needs follow-up urine microalbumin  Patient Instructions  Click 30 min before each meal  If sugar stay > 200 after lunch use 6 clicks at lunch  [place] time on subjects discussed in assessment and plan sections is over 50% of today's 25 minute visit     Elayne Snare 07/01/2017, 12:45 PM   Note: This office note was prepared with Dragon voice recognition system technology. Any transcriptional errors that result from this process are unintentional.    Elayne Snare

## 2017-07-02 ENCOUNTER — Other Ambulatory Visit: Payer: Medicare Other

## 2017-07-02 ENCOUNTER — Other Ambulatory Visit: Payer: Self-pay | Admitting: Family Medicine

## 2017-07-02 DIAGNOSIS — E7800 Pure hypercholesterolemia, unspecified: Secondary | ICD-10-CM

## 2017-07-02 DIAGNOSIS — E1165 Type 2 diabetes mellitus with hyperglycemia: Secondary | ICD-10-CM | POA: Diagnosis not present

## 2017-07-02 DIAGNOSIS — Z794 Long term (current) use of insulin: Secondary | ICD-10-CM | POA: Diagnosis not present

## 2017-07-02 LAB — MICROALBUMIN / CREATININE URINE RATIO
Creatinine,U: 11.3 mg/dL
MICROALB UR: 1.4 mg/dL (ref 0.0–1.9)
MICROALB/CREAT RATIO: 12.2 mg/g (ref 0.0–30.0)

## 2017-07-02 LAB — FRUCTOSAMINE: FRUCTOSAMINE: 231 umol/L (ref 0–285)

## 2017-07-05 ENCOUNTER — Telehealth: Payer: Self-pay | Admitting: Gastroenterology

## 2017-07-05 ENCOUNTER — Ambulatory Visit: Payer: Self-pay | Admitting: Gastroenterology

## 2017-07-05 NOTE — Telephone Encounter (Signed)
NOTED

## 2017-07-06 ENCOUNTER — Telehealth: Payer: Self-pay

## 2017-07-06 NOTE — Telephone Encounter (Signed)
[facility] to inquire about missing information on patient's application. Staff member at [facility] stated that after looking through patient's info submitted, that all information appeared to be there. I was placed on a brief hold to look further at the application to determine if anything else is needed. After returning from the hold, staff member stated that everything was correct in patients chart and on their application. The application was then sent to processing in order to go through Part D requirement processing. The staff member sent an email to her supervisor with a request that the application be processed quicker, but there is no specific turn around time for it. She stated that if this office wishes, we can call and check on the status of the process on Monday 07/12/2017. Once the application is approved, the patient will receive a letter in the mail, and this office will receive a fax and then a shipment date will be issued. Attempted to call patient and inform him of this, but patient did not answer, and voicemail was left asking him to call the office back at his earliest convenience.

## 2017-07-09 ENCOUNTER — Ambulatory Visit: Payer: Self-pay | Admitting: Gastroenterology

## 2017-07-09 ENCOUNTER — Encounter

## 2017-07-14 DIAGNOSIS — J449 Chronic obstructive pulmonary disease, unspecified: Secondary | ICD-10-CM | POA: Diagnosis not present

## 2017-07-14 DIAGNOSIS — I5032 Chronic diastolic (congestive) heart failure: Secondary | ICD-10-CM | POA: Diagnosis not present

## 2017-07-14 DIAGNOSIS — J9611 Chronic respiratory failure with hypoxia: Secondary | ICD-10-CM | POA: Diagnosis not present

## 2017-07-16 ENCOUNTER — Other Ambulatory Visit: Payer: Self-pay | Admitting: Family Medicine

## 2017-07-16 MED ORDER — TAMSULOSIN HCL 0.4 MG PO CAPS: ORAL_CAPSULE | ORAL | 1 refills | Status: DC

## 2017-07-21 ENCOUNTER — Encounter: Payer: Self-pay | Admitting: Family Medicine

## 2017-08-05 NOTE — Telephone Encounter (Signed)
[facility] to inquire about patient's insulin. A staff member stated that when the pt's application was sent to be reviewed again, it was kicked back because the patient provided an income from [employer] rather than an acceptable form.  Staff member stated that an acceptable proof of income would be  -10/40 form -10/99 form -social security award letter -[place] -At least 3 months worth of bank statements showing a consistent income being deposited.  After this is sent to [facility], they will then fax the office a form to confirm that the insulin being requested is, in face U-500 insulin. Staff member stated that if U-500 insulin is dosed under 200 units daily, the form has to be filled out by the MD verifying that they are aware of this and that they would like to proceed with the U-500 insulin at this dose. Attempted to call pt at his home phone number and there was no answer or answering machine available. I then called pt's cell phone listed in his chart, and left a voicemail for pt or his wife to call the office back.

## 2017-08-05 NOTE — Telephone Encounter (Signed)
Patient was called in regards to another patient assistance form that was received and the pt stated that he still has not heard anything from [facility] regarding the medications. Pt was informed that I would call [facility] [employer] on his behalf and see why the medication has not been sent.

## 2017-08-07 ENCOUNTER — Other Ambulatory Visit: Payer: Self-pay | Admitting: Family Medicine

## 2017-08-09 ENCOUNTER — Other Ambulatory Visit (HOSPITAL_COMMUNITY): Payer: Self-pay | Admitting: Cardiology

## 2017-08-09 MED ORDER — RIVAROXABAN 20 MG PO TABS: 20.0000 mg | ORAL_TABLET | Freq: Every day | ORAL | 11 refills | Status: DC

## 2017-08-09 NOTE — Telephone Encounter (Signed)
Left message for pt to call back.

## 2017-08-09 NOTE — Telephone Encounter (Signed)
Based on this RF history, it looks like he hasn't taken this in 6 months or so. Pls check and make sure pt is really wanting me to RF this med.  If he IS, ask what specific pain he is using this to treat.-thx

## 2017-08-09 NOTE — Telephone Encounter (Signed)
rx printed to accompany johnson and Arts development officer

## 2017-08-09 NOTE — Telephone Encounter (Signed)
RF request for gabapentin LOV: 04/19/17 Next ov: 08/18/17 Last written: 06/29/16 #180 w/ 6RF  Please advise. Thanks.

## 2017-08-09 NOTE — Telephone Encounter (Signed)
SW pt, he stated that he did request this medication and he has been taking it for the neuropathy in his hands and feet.

## 2017-08-09 NOTE — Telephone Encounter (Signed)
OK, will authorize RFs of gabapentin.

## 2017-08-10 ENCOUNTER — Other Ambulatory Visit: Payer: Self-pay

## 2017-08-10 MED ORDER — ICOSAPENT ETHYL 1 G PO CAPS: ORAL_CAPSULE | ORAL | 2 refills | Status: DC

## 2017-08-10 NOTE — Patient Outreach (Signed)
[facility]) Care Management  08/10/2017  [name] Feb 17, 1940 [id number]   Medication Adherence call to [name] spoke with patient's wife she said he is only taking half of tablet of Losartan 50 mg at this time and he still has medication for one more week. this are new instruction from doctor.[name] is showing past due under [place].  [place] Management Direct Dial [phone]  Fax [phone] [email or web address]

## 2017-08-14 DIAGNOSIS — J449 Chronic obstructive pulmonary disease, unspecified: Secondary | ICD-10-CM | POA: Diagnosis not present

## 2017-08-14 DIAGNOSIS — J9611 Chronic respiratory failure with hypoxia: Secondary | ICD-10-CM | POA: Diagnosis not present

## 2017-08-14 DIAGNOSIS — I5032 Chronic diastolic (congestive) heart failure: Secondary | ICD-10-CM | POA: Diagnosis not present

## 2017-08-16 DIAGNOSIS — H353132 Nonexudative age-related macular degeneration, bilateral, intermediate dry stage: Secondary | ICD-10-CM | POA: Diagnosis not present

## 2017-08-16 LAB — HM DIABETES EYE EXAM

## 2017-08-17 ENCOUNTER — Encounter: Payer: Self-pay | Admitting: Family Medicine

## 2017-08-18 ENCOUNTER — Ambulatory Visit (INDEPENDENT_AMBULATORY_CARE_PROVIDER_SITE_OTHER): Payer: Medicare Other | Admitting: Family Medicine

## 2017-08-18 ENCOUNTER — Encounter: Payer: Self-pay | Admitting: Family Medicine

## 2017-08-18 VITALS — BP 108/52 | HR 64 | Temp 98.6°F | Resp 16 | Ht 71.5 in | Wt 226.2 lb

## 2017-08-18 DIAGNOSIS — I10 Essential (primary) hypertension: Secondary | ICD-10-CM

## 2017-08-18 DIAGNOSIS — N183 Chronic kidney disease, stage 3 unspecified: Secondary | ICD-10-CM

## 2017-08-18 DIAGNOSIS — J449 Chronic obstructive pulmonary disease, unspecified: Secondary | ICD-10-CM | POA: Diagnosis not present

## 2017-08-18 DIAGNOSIS — Z862 Personal history of diseases of the blood and blood-forming organs and certain disorders involving the immune mechanism: Secondary | ICD-10-CM

## 2017-08-18 LAB — BASIC METABOLIC PANEL
BUN: 39 mg/dL — ABNORMAL HIGH (ref 6–23)
CHLORIDE: 102 meq/L (ref 96–112)
CO2: 28 mEq/L (ref 19–32)
Calcium: 9.8 mg/dL (ref 8.4–10.5)
Creatinine, Ser: 1.3 mg/dL (ref 0.40–1.50)
GFR: 56.7 mL/min — ABNORMAL LOW (ref >60.00)
Glucose, Bld: 247 mg/dL — ABNORMAL HIGH (ref 70–99)
POTASSIUM: 4.6 meq/L (ref 3.5–5.1)
SODIUM: 141 meq/L (ref 135–145)

## 2017-08-18 NOTE — Progress Notes (Signed)
OFFICE VISIT  08/18/2017   CC:  Chief Complaint  Patient presents with  . Follow-up    RCI, pt is not fasting.     HPI:    Patient is a 77 y.o. Caucasian male who presents for 4 mo f/u HTN, CRI III, COPD. Pt has CAD s/p CABG, chronic systolic HF (EF 43-88%), LBBB, and parox A-fib-->managed by Dr. [name] at [facility].  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  HTN: home bp's normal consistently.  No low bp's.  COPD: no wheezing, only occ cough w occ whitish mucous.  Walks 200 ft before feeling SOB---this is his baseline. Taking albut infrequently, no longer taking symbicort.  CRI: drinking plenty of fluids.  Avoids NSAIDs.  ROS: no CP, no dizziness, no HAs, no rashes, no melena/hematochezia.  No polyuria or polydipsia.  No myalgias or arthralgias.   Past Medical History:  Diagnosis Date  . Acute respiratory distress 06/04/2016  . Adenomatous colon polyp 10/16/2011   Repeat 2018  . Arthritis     Hips, R>L & KNEES  . CAD, multiple vessel    3V CAD cath 08/08/13----CABG done shortly after.  Cath 06/2016: clear bipasses/no interventional lesion.  . 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 pylori neg, no dysplasia or malignancy.  . Chronic combined systolic and diastolic CHF, NYHA class 2 ([place] 2016-2018   [facility].  Transesoph Echo 09/2016 EF improved to 40-45%; diffuse hypokinesis, Grd I DD.  .Marland KitchenChronic renal insufficiency, stage 3 (moderate) (HCC) 2018   GFR 50s  . COPD (chronic obstructive pulmonary disease) ([place]    GOLD II.  Spirometry  2004 borderline obstruction; 2015 mod obst: noncompliant with bid symbicort so pulmonologist switched him to a once daily inhaler: Trelegy ellipta 12/2015.  Oxygen prn: goal 88-92%.  . Diabetic nephropathy ([place]    Elevated urine microalb/cr 03/2011  . DM type 2  (diabetes mellitus, type 2) ([place]    Poor control on max oral meds--pt eventually agreed to insulin therapy.  As of 2017 his DM is being managed by Dr. KDwyane Deein endo.  On insulin pump as of 04/2017 endo f/u.  .Marland KitchenDOE (dyspnea on exertion)    COPD + chronic diastolic HF  . Dysthymia    Worsened after 2015 heart surgery.  Memory loss +? as well.  Neuro eval 01/2017--plan is to get MRI brain, B12 level, and neuropsychiatric testing.  . Erectile dysfunction    Normal testosterone  . Hearing loss of both ears 2016   Hearing aids  . Hyperplastic colon polyp 2001   No further colonoscopies to be done as of 11/2016 GI eval.  . Hypertension   . IBS (irritable bowel syndrome)    responds well to levsin  . Iron deficiency anemia 2014   03/2012 capsule endoscopy showed 2 AVMs--likely responsible for his IDA--lifetime iron supp recommended + q670moBCs.  . Ischemic cardiomyopathy 06/2016; 09/2016   06/2016: EF 25-30%, global hypokinesis, grd II DD, vent septum motion changes c/w LBBB, mod MV regurg, L atr mod/sev dilat, R atr mod dilat, mod incr pulm press, Grd II DD.  09/2016--EF 40-45%; diffuse hypokinesis, Grd I DD.  . Macular degeneration, dry    Mild, bilat (Optometrist, [place] [employer] [place] . Mild cognitive impairment    MCI, likley vascular  etiology, + MDD, recurrent-->neuropsych testing 06/2017.  Neurol to repeat testing 1 yr  . Mixed hyperlipidemia    statin and vascepa as of 04/2017  . Obesity   . Open toe wound 11/2015   Wound care clinic appt made and then canceled when toe improved.  . Other and unspecified angina pectoris   . PAD (peripheral artery disease) ([place]) 02/2016   Abnormal ABI's and waveforms: LE arterial duplex ordered for f/u as per cardiologist's recommendation.  Vasc eval by Dr. Bridgett Larsson: impression was minimal PAD, recommended maximize med mgmt.  [name] PAF (paroxysmal atrial fibrillation) ([place]) 10/2014   xarelto  . Pericardial effusion with cardiac  tamponade 6//29/15   pericardiocentesis was done,  Infectious/inflamm (cytology showed NO MALIGNANT CELLS)  . Pleural plaque    Pleural plaques/asbestosis changes on CT chest done by pulm 05/2016.  [name] Pneumonia   . Tobacco dependence in remission    100+ pack-yr hx: quit after CABG    Past Surgical History:  Procedure Laterality Date  . APPENDECTOMY  1957  . CARDIAC CATHETERIZATION  08/08/2013  . CATARACT EXTRACTION W/ INTRAOCULAR LENS  IMPLANT, BILATERAL  04/08/2006 & 04/22/2006  . CATARACT EXTRACTION W/ INTRAOCULAR LENS  IMPLANT, BILATERAL Bilateral   . CHOLECYSTECTOMY OPEN  1999  . COLONOSCOPY  10/16/2011   Procedure: COLONOSCOPY;  Surgeon: Inda [place], MD;  Location: [facility];  Service: Endoscopy;  Laterality: N/A;  . CORONARY ARTERY BYPASS GRAFT N/A 08/14/2013   Procedure: CORONARY ARTERY BYPASS GRAFTING (CABG);  Surgeon: Melrose Nakayama, MD;  Location: [facility];  Service: Open Heart Surgery;  Laterality: N/A;  Times 4   using left internal mammary artery and endoscopically harvested bilateral saphenous vein  . ESOPHAGOGASTRODUODENOSCOPY  02/25/12   Atrophic gastritis with a few erosions--capsule endoscopy planned as of 02/25/12 (Dr. Deatra Ina).  [name] HARDWARE REMOVAL Right 08/12/2012   Procedure: HARDWARE REMOVAL;  Surgeon: Mcarthur Rossetti, MD;  Location: [facility];  Service: Orthopedics;  Laterality: Right;  . HIP SURGERY Right 1954   Repair of slipped capital femoral epiphysis.  . INTRAOPERATIVE TRANSESOPHAGEAL ECHOCARDIOGRAM N/A 08/14/2013   Normal LV function. Procedure: INTRAOPERATIVE TRANSESOPHAGEAL ECHOCARDIOGRAM;  Surgeon: Melrose Nakayama, MD;  Location: [facility];  Service: Open Heart Surgery;  Laterality: N/A;  . LEFT HEART CATHETERIZATION WITH CORONARY ANGIOGRAM N/A 08/08/2013   Procedure: LEFT HEART CATHETERIZATION WITH CORONARY ANGIOGRAM;  Surgeon: Jettie Booze, MD;  Location: [facility] CATH LAB;  Service: Cardiovascular;  Laterality: N/A;  . PATELLA FRACTURE SURGERY Left ~  1979   bolt + 3 screws to repair tib plateau fx  . PERICARDIAL TAP N/A 07/01/2013   Procedure: PERICARDIAL TAP;  Surgeon: Jettie Booze, MD;  Location: [facility] CATH LAB;  Service: Cardiovascular;  Laterality: N/A;  . RIGHT/LEFT HEART CATH AND CORONARY ANGIOGRAPHY N/A 06/10/2016   Procedure: Right/Left Heart Cath and Coronary Angiography;  Surgeon: Larey Dresser, MD;  Location: [facility] CV LAB;  Service: Cardiovascular;  Laterality: N/A;  . TESTICLE SURGERY  as a child   Undescended testicle brought down into scrotum  . TONSILLECTOMY  1947  . TOTAL HIP ARTHROPLASTY Right 08/12/2012   Procedure: REMOVAL OF OLD PINS RIGHT HIP AND RIGHT TOTAL HIP ARTHROPLASTY ANTERIOR APPROACH;  Surgeon: Mcarthur Rossetti, MD;  Location[facility];  Service: Orthopedics;  Laterality: Right;  . TRANSESOPHAGEAL ECHOCARDIOGRAM  09/22/2016   EF 40-45%; diffuse hypokinesis, Grd I DD.  [name] TRANSTHORACIC ECHOCARDIOGRAM  10/30/14   Mod LVH, EF 60-65%, normal wall motion, mod mitral regurg,  mild PAH  . TRANSTHORACIC ECHOCARDIOGRAM  06/2016   06/2016: EF 25-30%, global hypokinesis, grd II DD, vent septum motion changes c/w LBBB, mod MV regurg, L atr mod/sev dilat, R atr mod dilat, mod incr pulm press, Grd II DD.    Outpatient Medications Prior to Visit  Medication Sig Dispense Refill  . ACCU-CHEK GUIDE test strip USE TO CHECK BLOOD SUGAR 3 TIMES DAILY 100 each 11  . acetaminophen (TYLENOL) 325 MG tablet Take 2 tablets (650 mg total) by mouth every 4 (four) hours as needed for headache or mild pain.    . Ascorbic Acid (VITAMIN C) 1000 MG tablet Take 1,000 mg by mouth daily.    [name] atorvastatin (LIPITOR) 40 MG tablet Take 40 mg by mouth daily.    . bisoprolol (ZEBETA) 5 MG tablet Take 1.5 tablets (7.5 mg total) by mouth daily. 45 tablet 6  . Cholecalciferol (VITAMIN D-3) 1000 units CAPS Take 1,000 Units by mouth daily. Reported on 02/26/2015    . DIGOX 125 MCG tablet TAKE ONE TABLET BY MOUTH EVERY DAY 30 tablet 6  .  fluticasone (FLONASE) 50 MCG/ACT nasal spray Place 2 sprays into both nostrils daily. 16 g 1  . gabapentin (NEURONTIN) 100 MG capsule TAKE TWO CAPSULES BY MOUTH THREE TIMES DAILY 180 capsule 11  . guaiFENesin (MUCINEX) 600 MG 12 hr tablet Take 2 tablets (1,200 mg total) by mouth 2 (two) times daily. 30 tablet 0  . hyoscyamine (LEVSIN SL) 0.125 MG SL tablet TAKE 1 TO 2 TABLETS BY MOUTH EVERY 6 HOURS AS NEEDED FOR DIARRHEA 60 tablet 3  . Icosapent Ethyl (VASCEPA) 1 g CAPS 2 capsules twice a day 120 capsule 2  . Insulin Disposable Pump (V-GO 20) KIT Apply one Vgo 20 pump daily. 30 kit 3  . insulin regular human CONCENTRATED (HUMULIN R) 500 UNIT/ML injection INJECT 56 UNITS SUBCUTANEOUSLY DAILY IN INSULIN PUMP 20 mL 3  . isosorbide mononitrate (IMDUR) 30 MG 24 hr tablet TAKE ONE TABLET BY MOUTH EVERY DAY 90 tablet 1  . losartan (COZAAR) 25 MG tablet TAKE ONE TABLET BY MOUTH TWICE DAILY WITH A MEAL 60 tablet 3  . mirtazapine (REMERON) 45 MG tablet Take 1 tablet (45 mg total) by mouth at bedtime. 30 tablet 5  . Multiple Vitamin (MULTIVITAMIN WITH MINERALS) TABS Take 1 tablet by mouth daily.    . nitroGLYCERIN (NITROSTAT) 0.4 MG SL tablet PLACE 1 TABLET UNDER THE TONGUE EVERY 5 MINUTES AS NEEDED FOR CHEST PAIN 25 tablet 3  . PROAIR HFA 108 (90 Base) MCG/ACT inhaler INHALE 2 PUFFS INTO THE LUNGS EVERY 4 HOURS AS NEEDED FOR WHEEZING ORSHORTNESS OF BREATH 8.5 g 1  . rivaroxaban (XARELTO) 20 MG TABS tablet Take 1 tablet (20 mg total) by mouth daily with supper. 30 tablet 11  . sertraline (ZOLOFT) 25 MG tablet TAKE TWO TABLETS BY MOUTH EVERY DAY 60 tablet 3  . spironolactone (ALDACTONE) 25 MG tablet TAKE ONE TABLET BY MOUTH EVERY DAY 30 tablet 6  . SYMBICORT 160-4.5 MCG/ACT inhaler INHALE 2 PUFFS INTO THE LUNGS TWICE DAILY 10.2 g 5  . SYNJARDY 05-998 MG TABS TAKE ONE TABLET BY MOUTH TWICE DAILY AFTER A MEAL 60 tablet 2  . tamsulosin (FLOMAX) 0.4 MG CAPS capsule TAKE 1 CAPSULE BY MOUTH EVERY DAY AFTER SUPPER 90  capsule 1  . torsemide (DEMADEX) 20 MG tablet TAKE TWO TABLETS BY MOUTH EVERY DAY 60 tablet 6  . OXYGEN Inhale 2 L into the lungs.  No facility-administered medications prior to visit.     Allergies  Allergen Reactions  . Morphine And Related Other (See Comments)    Drenched with perspiration  . Entresto [Sacubitril-Valsartan]     hypotension  . Demerol [Meperidine] Nausea Only  . Starlix [Nateglinide] Other (See Comments)    gassy    ROS As per HPI  PE: Blood pressure (!) 108/52, pulse 64, temperature 98.6 F (37 C), temperature source Oral, resp. rate 16, height 5' 11.5" (1.816 m), weight 226 lb 4 oz (102.6 kg), SpO2 92 %. Body mass index is 31.12 kg/m.  Gen: Alert, well appearing.  Patient is oriented to person, place, time, and situation. AFFECT: pleasant, lucid thought and speech. CV: RRR, no m/r/g.   LUNGS: CTA bilat, nonlabored resps, good aeration in all lung fields. EXT: no clubbing or cyanosis.  2+ pitting edema L LL, 1+ pitting edema R LL.  LABS:  Lab Results  Component Value Date   TSH 1.612 08/28/2016   Lab Results  Component Value Date   WBC 8.3 04/19/2017   HGB 14.2 04/19/2017   HCT 45.0 04/19/2017   MCV 84.0 04/19/2017   PLT 173.0 04/19/2017   Lab Results  Component Value Date   IRON 71 05/18/2016   IRON 76 05/18/2016   TIBC 406 05/18/2016   FERRITIN 18.1 (L) 05/18/2016    Lab Results  Component Value Date   CREATININE 1.04 07/01/2017   BUN 28 (H) 07/01/2017   NA 142 07/01/2017   K 5.1 07/01/2017   CL 106 07/01/2017   CO2 29 07/01/2017   Lab Results  Component Value Date   ALT 15 07/01/2017   AST 16 07/01/2017   ALKPHOS 68 07/01/2017   BILITOT 0.3 07/01/2017   Lab Results  Component Value Date   CHOL 121 04/26/2017   Lab Results  Component Value Date   HDL 31.80 (L) 04/26/2017   Lab Results  Component Value Date   LDLCALC 5 02/23/2017   Lab Results  Component Value Date   TRIG 397.0 (H) 04/26/2017   Lab Results   Component Value Date   CHOLHDL 4 04/26/2017   Lab Results  Component Value Date   PSA 1.33 09/01/2011   Lab Results  Component Value Date   HGBA1C 8.4 04/26/2017    IMPRESSION AND PLAN:  1) HTN: The current medical regimen is effective;  continue present plan and medications. Lytes/cr today.  2) CRI III: avoids NSaiDs, hydrates well.  Lytes/cr today.  3) COPD: The current medical regimen is effective;  continue present plan and medications. Seems to be doing fine off of symbicort completely--he really was unable to remember to take this med on regular basis anyway.  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  CBC next visit.  5) Chron syst CHF, PAF, LBBB: all stable, managed by cardiology.  Continue routine Cards f/u.  6) DM 2: managed by Dr. KDwyane Deein endo.  An After Visit Summary was printed and given to the patient.  FOLLOW UP: Return in about 4 months (around 12/18/2017) for annual CPE (fasting).  Signed:  PCrissie Sickles MD           08/18/2017

## 2017-08-20 ENCOUNTER — Other Ambulatory Visit: Payer: Medicare Other

## 2017-08-20 ENCOUNTER — Ambulatory Visit: Payer: Medicare Other | Admitting: Gastroenterology

## 2017-08-20 ENCOUNTER — Encounter: Payer: Self-pay | Admitting: Gastroenterology

## 2017-08-20 ENCOUNTER — Telehealth: Payer: Self-pay | Admitting: *Deleted

## 2017-08-20 VITALS — BP 124/38 | HR 66 | Ht 71.5 in | Wt 229.5 lb

## 2017-08-20 DIAGNOSIS — N183 Chronic kidney disease, stage 3 unspecified: Secondary | ICD-10-CM

## 2017-08-20 DIAGNOSIS — K529 Noninfective gastroenteritis and colitis, unspecified: Secondary | ICD-10-CM | POA: Diagnosis not present

## 2017-08-20 DIAGNOSIS — I5032 Chronic diastolic (congestive) heart failure: Secondary | ICD-10-CM | POA: Diagnosis not present

## 2017-08-20 DIAGNOSIS — J438 Other emphysema: Secondary | ICD-10-CM

## 2017-08-20 NOTE — Patient Instructions (Addendum)
If you are age 78 or older, your body mass index should be between 23-30. Your Body mass index is 31.56 kg/m. If this is out of the aforementioned range listed, please consider follow up with your Primary Care Provider.  If you are age 35 or younger, your body mass index should be between 19-25. Your Body mass index is 31.56 kg/m. If this is out of the aformentioned range listed, please consider follow up with your Primary Care Provider.   Your provider has requested that you go to the basement level for lab work before leaving today. Press "B" on the elevator. The lab is located at the first door on the left as you exit the elevator.  2 pepto bismol capsules every morning.  Call in 2 weeks with an update.  It was a pleasure to see you today!  Dr. Loletha Carrow

## 2017-08-20 NOTE — Progress Notes (Signed)
[facility] Progress Note  Chief Complaint: Chronic diarrhea  Subjective  History:  Joshua [name] is here for the first time since his visit with me in November 2018, and he is still having intermittent diarrhea.  It previously occurred a couple of times a week, now it happens 3 or 5 times a week.  There is some urgency and at times he will have accidents if he cannot get to the bathroom on time.  He has no abdominal pain, denies bloating or gas or rectal bleeding.  As before, as needed use of hyoscyamine will help once he has the diarrhea will be still finds this troublesome. His appetite is good and his weight reportedly stable.  As noted in my consult note from last year, he has multiple complicated medical issues that would make him high risk for the sedation of an endoscopic procedure. He is on multiple medicines, it is difficult for him and his wife to know exactly when he started some of them.  She believes he has been on the Cozaar for at least a few years.  ROS: Cardiovascular:  no chest pain Respiratory: Chronic dyspnea with exertion Arthralgias Remainder of systems negative except as above The patient's Past Medical, Family and Social History were reviewed and are on file in the EMR.  Objective:  Med list reviewed  Current Outpatient Medications:  .  ACCU-CHEK GUIDE test strip, USE TO CHECK BLOOD SUGAR 3 TIMES DAILY, Disp: 100 each, Rfl: 11 .  acetaminophen (TYLENOL) 325 MG tablet, Take 2 tablets (650 mg total) by mouth every 4 (four) hours as needed for headache or mild pain., Disp: , Rfl:  .  Ascorbic Acid (VITAMIN C) 1000 MG tablet, Take 1,000 mg by mouth daily., Disp: , Rfl:  .  atorvastatin (LIPITOR) 40 MG tablet, Take 40 mg by mouth daily., Disp: , Rfl:  .  bisoprolol (ZEBETA) 5 MG tablet, Take 1.5 tablets (7.5 mg total) by mouth daily., Disp: 45 tablet, Rfl: 6 .  Cholecalciferol (VITAMIN D-3) 1000 units CAPS, Take 1,000 Units by mouth daily. Reported on 02/26/2015, Disp:  , Rfl:  .  DIGOX 125 MCG tablet, TAKE ONE TABLET BY MOUTH EVERY DAY, Disp: 30 tablet, Rfl: 6 .  fluticasone (FLONASE) 50 MCG/ACT nasal spray, Place 2 sprays into both nostrils daily., Disp: 16 g, Rfl: 1 .  gabapentin (NEURONTIN) 100 MG capsule, TAKE TWO CAPSULES BY MOUTH THREE TIMES DAILY, Disp: 180 capsule, Rfl: 11 .  guaiFENesin (MUCINEX) 600 MG 12 hr tablet, Take 2 tablets (1,200 mg total) by mouth 2 (two) times daily., Disp: 30 tablet, Rfl: 0 .  hyoscyamine (LEVSIN SL) 0.125 MG SL tablet, TAKE 1 TO 2 TABLETS BY MOUTH EVERY 6 HOURS AS NEEDED FOR DIARRHEA, Disp: 60 tablet, Rfl: 3 .  Icosapent Ethyl (VASCEPA) 1 g CAPS, 2 capsules twice a day, Disp: 120 capsule, Rfl: 2 .  Insulin Disposable Pump (V-GO 20) KIT, Apply one Vgo 20 pump daily., Disp: 30 kit, Rfl: 3 .  insulin regular human CONCENTRATED (HUMULIN R) 500 UNIT/ML injection, INJECT 56 UNITS SUBCUTANEOUSLY DAILY IN INSULIN PUMP, Disp: 20 mL, Rfl: 3 .  isosorbide mononitrate (IMDUR) 30 MG 24 hr tablet, TAKE ONE TABLET BY MOUTH EVERY DAY, Disp: 90 tablet, Rfl: 1 .  losartan (COZAAR) 25 MG tablet, TAKE ONE TABLET BY MOUTH TWICE DAILY WITH A MEAL, Disp: 60 tablet, Rfl: 3 .  mirtazapine (REMERON) 45 MG tablet, Take 1 tablet (45 mg total) by mouth at bedtime., Disp: 30  tablet, Rfl: 5 .  Multiple Vitamin (MULTIVITAMIN WITH MINERALS) TABS, Take 1 tablet by mouth daily., Disp: , Rfl:  .  nitroGLYCERIN (NITROSTAT) 0.4 MG SL tablet, PLACE 1 TABLET UNDER THE TONGUE EVERY 5 MINUTES AS NEEDED FOR CHEST PAIN, Disp: 25 tablet, Rfl: 3 .  OXYGEN, Inhale 2 L into the lungs., Disp: , Rfl:  .  PROAIR HFA 108 (90 Base) MCG/ACT inhaler, INHALE 2 PUFFS INTO THE LUNGS EVERY 4 HOURS AS NEEDED FOR WHEEZING ORSHORTNESS OF BREATH, Disp: 8.5 g, Rfl: 1 .  rivaroxaban (XARELTO) 20 MG TABS tablet, Take 1 tablet (20 mg total) by mouth daily with supper., Disp: 30 tablet, Rfl: 11 .  sertraline (ZOLOFT) 25 MG tablet, TAKE TWO TABLETS BY MOUTH EVERY DAY, Disp: 60 tablet, Rfl:  3 .  spironolactone (ALDACTONE) 25 MG tablet, TAKE ONE TABLET BY MOUTH EVERY DAY, Disp: 30 tablet, Rfl: 6 .  SYMBICORT 160-4.5 MCG/ACT inhaler, INHALE 2 PUFFS INTO THE LUNGS TWICE DAILY, Disp: 10.2 g, Rfl: 5 .  SYNJARDY 05-998 MG TABS, TAKE ONE TABLET BY MOUTH TWICE DAILY AFTER A MEAL, Disp: 60 tablet, Rfl: 2 .  tamsulosin (FLOMAX) 0.4 MG CAPS capsule, TAKE 1 CAPSULE BY MOUTH EVERY DAY AFTER SUPPER, Disp: 90 capsule, Rfl: 1 .  torsemide (DEMADEX) 20 MG tablet, TAKE TWO TABLETS BY MOUTH EVERY DAY, Disp: 60 tablet, Rfl: 6   Vital signs in last 24 hrs: Vitals:   08/20/17 1605  BP: (!) 124/38  Pulse: 66    Physical Exam  Pleasant, chronically ill-appearing man, no acute distress.  Gets on exam table without assistance.  Wife present for entire encounter.  HEENT: sclera anicteric, oral mucosa moist without lesions  Neck: supple, no thyromegaly, JVD or lymphadenopathy  Cardiac: RRR without murmurs, S1S2 heard, + peripheral edema  Pulm: Mild wheezing at rest but difficult to tell if from lungs are upper airway.  Abdomen: soft, no tenderness, with active bowel sounds. No guarding or palpable hepatosplenomegaly.  Skin; warm and dry, no jaundice or rash  Recent Labs:  Hgb A1c 8.4 in April 2019   _0 @ Assessment: Encounter Diagnoses  Name Primary?  . Chronic diarrhea Yes  . Chronic diastolic CHF (congestive heart failure) ([place])   . Other emphysema ([place])   . CKD (chronic kidney disease) stage 3, GFR 30-59 ml/min (HCC)    Broad differential for this diarrhea.  He had a stool positive for lactoferrin in August 2018, raising suspicion for possible inflammatory cause.  It does not seem infectious for as long as it has been occurring.  The only 1 of his medicines that seems a possible culprit is the losartan, so I will message his cardiologist to see if it would ever be feasible to consider a change to ACE inhibitor for a few months. JW reports that the was a period of  time we had heavy alcohol use decades ago, so I will check a stool for fecal elastase to rule out exocrine pancreatic sufficiency. He has poorly controlled diabetes for years, which puts him at risk for small bowel bacterial overgrowth. Microscopic colitis seems likely given the that it is painless diarrhea.  Unfortunately, colonoscopy would be high risk for him.   Plan: Fecal elastase and lactoferrin Trial of 2 Pepto-Bismol capsules every morning (can be helpful if microscopic colitis or diarrhea of many causes.) I will message his cardiologist with the losartan question above His wife will message [place] in a few weeks with an update.  If he is not much  improved, consider empiric treatment with metronidazole for possible SIBO. Optimal glucose management essential   Total time 30 minutes, over half spent face-to-face with patient in counseling and coordination of care.   Nelida Meuse III

## 2017-08-20 NOTE — Telephone Encounter (Signed)
We received a recertification form from [facility] for pts oxygen.   SW pts wife and she stated that pt has not used the oxygen this year. She SW pt and he no longer wants to keep the oxygen but is interested in keeping the Marine scientist.   Called [employer], if pt wants to keep portable concentrator he will need to keep the other oxygne concentrator and come in for ov for testing. If pt does not want to keep either then we will need to fax a order to [facility] asking to cancel the oxygen order. Also if pt wants they can order an overnight ox to see if he still needs the oxygen.   Left message for pt to call back.

## 2017-08-23 ENCOUNTER — Encounter: Payer: Self-pay | Admitting: Family Medicine

## 2017-08-23 ENCOUNTER — Other Ambulatory Visit: Payer: Medicare Other

## 2017-08-23 DIAGNOSIS — N183 Chronic kidney disease, stage 3 unspecified: Secondary | ICD-10-CM

## 2017-08-23 DIAGNOSIS — K529 Noninfective gastroenteritis and colitis, unspecified: Secondary | ICD-10-CM | POA: Diagnosis not present

## 2017-08-23 DIAGNOSIS — J438 Other emphysema: Secondary | ICD-10-CM

## 2017-08-23 DIAGNOSIS — I5032 Chronic diastolic (congestive) heart failure: Secondary | ICD-10-CM | POA: Diagnosis not present

## 2017-08-23 MED ORDER — LISINOPRIL 10 MG PO TABS: 10.0000 mg | ORAL_TABLET | Freq: Every day | ORAL | 3 refills | Status: DC

## 2017-08-23 NOTE — Addendum Note (Signed)
Addended by: Darron Doom on: 08/23/2017 04:04 PM   Modules accepted: Orders

## 2017-08-23 NOTE — Progress Notes (Signed)
Yes I spoke with patient's wife and they will stop losartan and try lisinopril.  Medication sent to preferred pharmacy.

## 2017-08-23 NOTE — Progress Notes (Signed)
Yes, we could have him stop losartan and go on lisinopril 10 mg daily instead.  Susie/[name], could one of you arrange? Thanks.

## 2017-08-25 ENCOUNTER — Encounter: Payer: Self-pay | Admitting: Family Medicine

## 2017-08-25 NOTE — Telephone Encounter (Signed)
Order form filled out and placed on Dr. Idelle Leech desk for review/signature.

## 2017-08-25 NOTE — Telephone Encounter (Signed)
OK, pls order overnight oximetry testing: dx-->nocturnal hypoxemia and chronic hypoxemic respiratory failure.-thx

## 2017-08-25 NOTE — Telephone Encounter (Signed)
SW pts wife, she stated that after thinking about this she and pt feel it may be best if pt is retested to see if he should keep the oxygen. She would like to have pt do another overnight oximetry test. Please advise. Thanks.

## 2017-08-26 NOTE — Telephone Encounter (Signed)
Form completed and placed on Heather's desk.

## 2017-08-27 LAB — FECAL LACTOFERRIN, QUANT
FECAL LACTOFERRIN: NEGATIVE
MICRO NUMBER:: 90984362
SPECIMEN QUALITY: ADEQUATE

## 2017-08-27 LAB — PANCREATIC ELASTASE, FECAL: Pancreatic Elastase-1, Stool: 196 mcg/g — ABNORMAL LOW

## 2017-09-02 ENCOUNTER — Other Ambulatory Visit: Payer: Self-pay

## 2017-09-02 ENCOUNTER — Telehealth: Payer: Self-pay | Admitting: Gastroenterology

## 2017-09-02 MED ORDER — PANCRELIPASE (LIP-PROT-AMYL) 36000-114000 UNITS PO CPEP: 72000.0000 [IU] | ORAL_CAPSULE | Freq: Three times a day (TID) | ORAL | 0 refills | Status: DC

## 2017-09-14 ENCOUNTER — Other Ambulatory Visit: Payer: Self-pay | Admitting: Endocrinology

## 2017-09-14 DIAGNOSIS — J9611 Chronic respiratory failure with hypoxia: Secondary | ICD-10-CM | POA: Diagnosis not present

## 2017-09-14 DIAGNOSIS — J449 Chronic obstructive pulmonary disease, unspecified: Secondary | ICD-10-CM | POA: Diagnosis not present

## 2017-09-14 DIAGNOSIS — I5032 Chronic diastolic (congestive) heart failure: Secondary | ICD-10-CM | POA: Diagnosis not present

## 2017-09-15 NOTE — Progress Notes (Signed)
Subjective:  Patient ID: Joshua [name], male    DOB: October 02, 1940,  MRN: [id number]  Chief Complaint  Patient presents with  . Diabetes    Last A1C - 9.5 ; wants diabetic shoes     77 y.o. male presents  for diabetic foot care. Last AMBS was unnknown. Denies numbness and tingling in their feet. Denies cramping in legs and thighs.  Review of Systems: Negative except as noted in the HPI. Denies N/V/F/Ch.  Past Medical History:  Diagnosis Date  . Acute respiratory distress 06/04/2016  . Adenomatous colon polyp 10/16/2011   Repeat 2018  . Arthritis     Hips, R>L & KNEES  . CAD, multiple vessel    3V CAD cath 08/08/13----CABG done shortly after.  Cath 06/2016: clear bipasses/no interventional lesion.  . 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 pylori neg, no dysplasia or malignancy.  . Chronic combined systolic and diastolic CHF, NYHA class 2 ([place]) 2016-2018   Followed by [facility].  Transesoph Echo 09/2016 EF improved to 40-45%; diffuse hypokinesis, Grd I DD.  [name] Chronic renal insufficiency, stage 3 (moderate) (HCC) 2018   GFR 50s  . COPD (chronic obstructive pulmonary disease) ([place])    GOLD II.  Spirometry  2004 borderline obstruction; 2015 mod obst: noncompliant with bid symbicort so pulmonologist switched him to a once daily inhaler: Trelegy ellipta 12/2015.  Oxygen prn: goal 88-92%.  . Diabetic nephropathy ([place])    Elevated urine microalb/cr 03/2011  . DM type 2 (diabetes mellitus, type 2) ([place])    Poor control on max oral meds--pt eventually agreed to insulin therapy.  As of 2017 his DM is being managed by Dr. Dwyane Dee in endo.  On insulin pump as of 04/2017 endo f/u.  [name] DOE (dyspnea on exertion)    COPD + chronic diastolic HF  . Dysthymia    Worsened after 2015 heart surgery.  Memory loss +? as well.  Neuro eval 01/2017--plan is to get MRI brain, B12 level, and neuropsychiatric testing.  . Erectile dysfunction    Normal testosterone  .  Hearing loss of both ears 2016   Hearing aids  . Hyperplastic colon polyp 2001   No further colonoscopies to be done as of 11/2016 GI eval.  . Hypertension   . IBS (irritable bowel syndrome)    responds relatively well to prn levsin.  Pepto bismol q AM trial by Dr. Loletha Carrow 08/2017, also checking fecal elastace and lactoferrin.  . Iron deficiency anemia 2014   03/2012 capsule endoscopy showed 2 AVMs--likely responsible for his IDA--lifetime iron supp recommended + q10moCBCs.  . Ischemic cardiomyopathy 06/2016; 09/2016   06/2016: EF 25-30%, global hypokinesis, grd II DD, vent septum motion changes c/w LBBB, mod MV regurg, L atr mod/sev dilat, R atr mod dilat, mod incr pulm press, Grd II DD.  09/2016--EF 40-45%; diffuse hypokinesis, Grd I DD.  . 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 Mild cognitive impairment    MCI, likley vascular etiology, + MDD, recurrent-->neuropsych testing 06/2017.  Neurol to repeat testing 1 yr  . Mixed hyperlipidemia    statin and vascepa as of 04/2017  . Nocturnal hypoxemia   . Obesity   . Open toe wound 11/2015   Wound care clinic appt made and then canceled when toe improved.  . Other and unspecified angina pectoris   . PAD (peripheral artery disease) ([place] 02/2016  Abnormal ABI's and waveforms: LE arterial duplex ordered for f/u as per cardiologist's recommendation.  Vasc eval by Dr. Bridgett Larsson: impression was minimal PAD, recommended maximize med mgmt.  [name] PAF (paroxysmal atrial fibrillation) ([place]) 10/2014   xarelto  . Pericardial effusion with cardiac tamponade 6//29/15   pericardiocentesis was done,  Infectious/inflamm (cytology showed NO MALIGNANT CELLS)  . Pleural plaque    Pleural plaques/asbestosis changes on CT chest done by pulm 05/2016.  [name] Pneumonia   . Tobacco dependence in remission    100+ pack-yr hx: quit after CABG    Current Outpatient Medications:  .  ACCU-CHEK GUIDE test strip,  USE TO CHECK BLOOD SUGAR 3 TIMES DAILY, Disp: 100 each, Rfl: 11 .  acetaminophen (TYLENOL) 325 MG tablet, Take 2 tablets (650 mg total) by mouth every 4 (four) hours as needed for headache or mild pain., Disp: , Rfl:  .  Ascorbic Acid (VITAMIN C) 1000 MG tablet, Take 1,000 mg by mouth daily., Disp: , Rfl:  .  atorvastatin (LIPITOR) 40 MG tablet, Take 40 mg by mouth daily., Disp: , Rfl:  .  bisoprolol (ZEBETA) 5 MG tablet, Take 1.5 tablets (7.5 mg total) by mouth daily., Disp: 45 tablet, Rfl: 6 .  Cholecalciferol (VITAMIN D-3) 1000 units CAPS, Take 1,000 Units by mouth daily. Reported on 02/26/2015, Disp: , Rfl:  .  DIGOX 125 MCG tablet, TAKE ONE TABLET BY MOUTH EVERY DAY, Disp: 30 tablet, Rfl: 6 .  fluticasone (FLONASE) 50 MCG/ACT nasal spray, Place 2 sprays into both nostrils daily., Disp: 16 g, Rfl: 1 .  gabapentin (NEURONTIN) 100 MG capsule, TAKE TWO CAPSULES BY MOUTH THREE TIMES DAILY, Disp: 180 capsule, Rfl: 11 .  guaiFENesin (MUCINEX) 600 MG 12 hr tablet, Take 2 tablets (1,200 mg total) by mouth 2 (two) times daily., Disp: 30 tablet, Rfl: 0 .  hyoscyamine (LEVSIN SL) 0.125 MG SL tablet, TAKE 1 TO 2 TABLETS BY MOUTH EVERY 6 HOURS AS NEEDED FOR DIARRHEA, Disp: 60 tablet, Rfl: 3 .  Icosapent Ethyl (VASCEPA) 1 g CAPS, 2 capsules twice a day, Disp: 120 capsule, Rfl: 2 .  Insulin Disposable Pump (V-GO 20) KIT, Apply one Vgo 20 pump daily., Disp: 30 kit, Rfl: 3 .  insulin regular human CONCENTRATED (HUMULIN R) 500 UNIT/ML injection, INJECT 56 UNITS SUBCUTANEOUSLY DAILY IN INSULIN PUMP, Disp: 20 mL, Rfl: 3 .  isosorbide mononitrate (IMDUR) 30 MG 24 hr tablet, TAKE ONE TABLET BY MOUTH EVERY DAY, Disp: 90 tablet, Rfl: 1 .  lipase/protease/amylase (CREON) 36000 UNITS CPEP capsule, Take 2 capsules (72,000 Units total) by mouth 3 (three) times daily with meals., Disp: 48 capsule, Rfl: 0 .  lisinopril (PRINIVIL,ZESTRIL) 10 MG tablet, Take 1 tablet (10 mg total) by mouth daily., Disp: 90 tablet, Rfl: 3 .   mirtazapine (REMERON) 45 MG tablet, Take 1 tablet (45 mg total) by mouth at bedtime., Disp: 30 tablet, Rfl: 5 .  Multiple Vitamin (MULTIVITAMIN WITH MINERALS) TABS, Take 1 tablet by mouth daily., Disp: , Rfl:  .  nitroGLYCERIN (NITROSTAT) 0.4 MG SL tablet, PLACE 1 TABLET UNDER THE TONGUE EVERY 5 MINUTES AS NEEDED FOR CHEST PAIN, Disp: 25 tablet, Rfl: 3 .  OXYGEN, Inhale 2 L into the lungs., Disp: , Rfl:  .  PROAIR HFA 108 (90 Base) MCG/ACT inhaler, INHALE 2 PUFFS INTO THE LUNGS EVERY 4 HOURS AS NEEDED FOR WHEEZING ORSHORTNESS OF BREATH, Disp: 8.5 g, Rfl: 1 .  rivaroxaban (XARELTO) 20 MG TABS tablet, Take 1 tablet (20 mg total) by mouth  daily with supper., Disp: 30 tablet, Rfl: 11 .  sertraline (ZOLOFT) 25 MG tablet, TAKE TWO TABLETS BY MOUTH EVERY DAY, Disp: 60 tablet, Rfl: 3 .  spironolactone (ALDACTONE) 25 MG tablet, TAKE ONE TABLET BY MOUTH EVERY DAY, Disp: 30 tablet, Rfl: 6 .  SYMBICORT 160-4.5 MCG/ACT inhaler, INHALE 2 PUFFS INTO THE LUNGS TWICE DAILY, Disp: 10.2 g, Rfl: 5 .  SYNJARDY 05-998 MG TABS, TAKE ONE TABLET BY MOUTH TWICE DAILY AFTER A MEAL, Disp: 60 tablet, Rfl: 2 .  tamsulosin (FLOMAX) 0.4 MG CAPS capsule, TAKE 1 CAPSULE BY MOUTH EVERY DAY AFTER SUPPER, Disp: 90 capsule, Rfl: 1 .  torsemide (DEMADEX) 20 MG tablet, TAKE TWO TABLETS BY MOUTH EVERY DAY, Disp: 60 tablet, Rfl: 6  Social History   Tobacco Use  Smoking Status Former Smoker  . Packs/day: 0.25  . Years: 62.00  . Pack years: 15.50  . Types: Cigarettes  . Last attempt to quit: 10/23/2015  . Years since quitting: 1.8  Smokeless Tobacco Former Systems developer  . Quit date: 06/30/2013    Allergies  Allergen Reactions  . Morphine And Related Other (See Comments)    Drenched with perspiration  . Entresto [Sacubitril-Valsartan]     hypotension  . Demerol [Meperidine] Nausea Only  . Starlix [Nateglinide] Other (See Comments)    gassy   Objective:  There were no vitals filed for this visit. General AA&O x3. Normal mood and  affect.  Vascular Dorsalis pedis pulses present 1+ bilaterally  Posterior tibial pulses absent bilaterally  Capillary refill normal to all digits. Pedal hair growth normal.  Neurologic Epicritic sensation present bilaterally. Protective sensation with 5.07 monofilament  present bilaterally. Vibratory sensation present bilaterally.  Dermatologic No open lesions. Interspaces clear of maceration.  Normal skin temperature and turgor. Hyperkeratotic lesions: None bilaterally. Nails: brittle, onychomycosis, thickening, elongation  Orthopedic: No history of amputation. MMT 5/5 in dorsiflexion, plantarflexion, inversion, and eversion. Normal lower extremity joint ROM without pain or crepitus. Hammertoes bilat.   Assessment:   1. Diabetes mellitus type 2 with peripheral artery disease ([place])   2. Encounter for diabetic foot exam ([place])   3. Hammer toes of both feet    Plan:  Patient was evaluated and treated and all questions answered.  Diabetes with PAD, Onychomycosis -Educated on diabetic footcare. Diabetic risk level 1 -Rx for DM shoes for PAD and hammertoes.  No follow-ups on file.

## 2017-09-22 ENCOUNTER — Other Ambulatory Visit: Payer: Self-pay

## 2017-09-22 MED ORDER — INSULIN REGULAR HUMAN (CONC) 500 UNIT/ML ~~LOC~~ SOLN: SUBCUTANEOUS | 0 refills | Status: DC

## 2017-09-27 ENCOUNTER — Telehealth: Payer: Self-pay

## 2017-09-27 NOTE — Telephone Encounter (Signed)
Appears to have some problem with his pump.  Needs to change the pump now and give himself 10 units of Humulin R insulin with a syringe now. Also need to know if he is changing his pump every 24 hours consistently I will need to see blood sugars 2 hours after lunch and dinner when he comes for his follow-up

## 2017-09-27 NOTE — Telephone Encounter (Signed)
  9/19 192 6 am  9/20 323 6 am  9/21 314 at 6 am  9/22 163 10 am  today 458 6 am 12 today blood sugar 460  Please advise if there needs to be medication changes

## 2017-09-28 NOTE — Telephone Encounter (Signed)
Tried to call pt and unable to leave VM-not set up [phone].

## 2017-09-29 ENCOUNTER — Ambulatory Visit: Payer: Self-pay | Admitting: *Deleted

## 2017-09-29 ENCOUNTER — Ambulatory Visit: Payer: Medicare Other | Admitting: Endocrinology

## 2017-09-29 ENCOUNTER — Ambulatory Visit: Payer: Self-pay

## 2017-09-29 ENCOUNTER — Encounter: Payer: Self-pay | Admitting: Endocrinology

## 2017-09-29 ENCOUNTER — Telehealth: Payer: Self-pay | Admitting: Endocrinology

## 2017-09-29 VITALS — BP 98/52 | HR 51 | Ht 71.0 in | Wt 224.0 lb

## 2017-09-29 DIAGNOSIS — R531 Weakness: Secondary | ICD-10-CM

## 2017-09-29 DIAGNOSIS — Z794 Long term (current) use of insulin: Secondary | ICD-10-CM | POA: Diagnosis not present

## 2017-09-29 DIAGNOSIS — E1165 Type 2 diabetes mellitus with hyperglycemia: Secondary | ICD-10-CM | POA: Diagnosis not present

## 2017-09-29 DIAGNOSIS — Z23 Encounter for immunization: Secondary | ICD-10-CM

## 2017-09-29 LAB — GLUCOSE, POCT (MANUAL RESULT ENTRY): POC GLUCOSE: 68 mg/dL — AB (ref 70–99)

## 2017-09-29 LAB — POCT GLYCOSYLATED HEMOGLOBIN (HGB A1C): Hemoglobin A1C: 8.3 % — AB (ref 4.0–5.6)

## 2017-09-29 NOTE — Telephone Encounter (Signed)
Patient's wife called in with c/o "weakness." She says "he has been weak now for the past 3 1/2 weeks. He is sleeping all the time, not eating very much and not drinking very much. He's driving now from the endocrinologist, which his blood sugars are all over the place too. He's had diarrhea a couple of times during the 3 1/2 weeks." I asked is he able to walk, she says "he uses his walker, but I can tell he's weaker than normal and he's has mild SOB. I think the fluid is building up around his heart. He was acting like this when that happened, but he's worse to me." I asked about other symptoms, she says "a cough, not urinating as much as he normally does." According to protocol, see PCP within 24 hours, no availability with Dr. Anitra Lauth due to the 30 minute restriction set for the patient, or Dr. Raoul Pitch. I called the office and spoke to [place], [facility] who says to let the patient's wife know she will receive a call back with Dr. Idelle Leech recommendation. I advised the patient's wife, she verbalized understanding. Care advice given, she verbalized understanding. She says to call her back to [phone].   Reason for Disposition . [1] MODERATE weakness (i.e., interferes with work, school, normal activities) AND [2] persists > 3 days  Answer Assessment - Initial Assessment Questions 1. DESCRIPTION: "Describe how you are feeling."     Sleeping a lot  2. SEVERITY: "How bad is it?"  "Can you stand and walk?"   - MILD - Feels weak or tired, but does not interfere with work, school or normal activities   - [place] to stand and walk; weakness interferes with work, school, or normal activities   - SEVERE - Unable to stand or walk     Moderate 3. ONSET:  "When did the weakness begin?"     3 1/2 weeks ago 4. CAUSE: "What do you think is causing the weakness?"     The only thing I can think of is fluid building up around his heart 5. MEDICINES: "Have you recently started a new medicine or had a change in  the amount of a medicine?"     No 6. OTHER SYMPTOMS: "Do you have any other symptoms?" (e.g., chest pain, fever, cough, SOB, vomiting, diarrhea, bleeding, other areas of pain)     Decreased appetite, Mild SOB, cough, decreased urine output, diarrhea a couple of times 7. PREGNANCY: "Is there any chance you are pregnant?" "When was your last menstrual period?"     N/A  Protocols used: WEAKNESS (GENERALIZED) AND FATIGUE-A-AH

## 2017-09-29 NOTE — Telephone Encounter (Signed)
Joshua [name]

## 2017-09-29 NOTE — Telephone Encounter (Signed)
Pt's wife returned call. Informed of Dr. Idelle Leech instructions to take pt to ED. Wife verbalizes understanding.

## 2017-09-29 NOTE — Telephone Encounter (Signed)
Left detailed message on home vm, okay per DPR.  Also left detailed message on wife's cell vm.

## 2017-09-29 NOTE — Telephone Encounter (Signed)
Pt needs to go to ED.

## 2017-09-29 NOTE — Progress Notes (Signed)
Patient ID: Joshua [name], male   DOB: 09/01/1940, 77 y.o.   MRN: [id number]            Reason for Appointment:  Follow-up for Type 2 Diabetes  Referring physician: [place]   History of Present Illness:          Date of diagnosis of type 2 diabetes mellitus: ?  2002        Background history:   He apparently had been treated with metformin initially which was continued until about 2016. He previously has also been tried on Amaryl, Actos, Farxiga and Victoza but he claims that these did not help his blood sugar and were stopped Appears to have been on insulin since about 2014 but he does not remember details. He has been taking mostly Levemir insulin as a basal insulin but was also on Lantus in 2015 for some time His best A1c has been 7.3, once in 2014 and another time in 2016, otherwise his A1c has been higher and as high as 10.8  Recent history:   INSULIN regimen U-500 insulin with V-go 20 units basal, bolus clicks: 4-4-0 at meals    Non-insulin hypoglycemic drugs the patient is taking are: Synjardy 05/998 twice a day breakfast and suppertime  His last A1c was 9.5 in January and is 8.3 now   Current management, blood sugar patterns and problems identified:  He did not bring his monitor for download  He appears to have significant variability in his blood sugars even fasting and he may have readings in the 50s about once or twice a week with some symptom  He had called because of high sugars on the weekend it is not sure why blood sugars went up since he was using the pump consistently and changing it every 24 hours  He did have his pump come loose at night but the next morning his blood sugar was still reportedly only 55  BOLUSES are still being done right before eating instead of 30 minutes before despite instructions on each visit  He says that his appetite is variable and generally decreased  However not clear what his blood sugars are after evening meal  Today even  though he had a full sandwich at lunch his blood sugar is low in the afternoon in the office, however he had less food intake at breakfast this morning  He is not adjusting his boluses based on how much he is eating  Also unclear what his blood sugars are after supper  His weight is about the same    Side effects from medications have been: None  Compliance with the medical regimen: Fair R    Glucose monitoring:  done 2 times a day         Glucometer:  Accu-Chek .      Blood Glucose readings by recall:   PRE-MEAL Fasting Lunch Dinner Bedtime Overall  Glucose range: 55-400  200    Mean/median:        POST-MEAL PC Breakfast PC Lunch PC Dinner  Glucose range:    Not checked  Mean/median:      Previous readings  PRE-MEAL Fasting Lunch Dinner Bedtime Overall  Glucose range:  61-218      Mean/median:  125     142   POST-MEAL PC Breakfast PC Lunch PC Dinner  Glucose range:   208, 243  163-248  Mean/median:    199      Self-care: The diet that the patient has been  following is: tries to limit Sweet drinks .     Meal times are:  Breakfast is at 7 a.m.  Dinner: 5-6 PM    Typical meal intake: Breakfast is usually eggs,  Sausage, oatmeal or grits.  Lunch is a sandwich, any evening he has meat and vegetables, For snacks he will have fruit or crackers Boost at bedtime                Dietician visit, most recent: 01/09/16 CDE visit: 12/18               Exercise: Unable to do any     Weight history:  Wt Readings from Last 3 Encounters:  09/29/17 224 lb (101.6 kg)  08/20/17 229 lb 8 oz (104.1 kg)  08/18/17 226 lb 4 oz (102.6 kg)    Glycemic control:   Lab Results  Component Value Date   HGBA1C 8.3 (A) 09/29/2017   HGBA1C 8.4 04/26/2017   HGBA1C 9.5 01/12/2017   Lab Results  Component Value Date   MICROALBUR 1.4 07/02/2017   [place] 5 02/23/2017   CREATININE 1.30 08/18/2017   Lab Results  Component Value Date   MICRALBCREAT 12.2 07/02/2017    Other active  problems: See review of systems   Allergies as of 09/29/2017      Reactions   Morphine And Related Other (See Comments)   Drenched with perspiration   Entresto [sacubitril-valsartan]    hypotension   Demerol [meperidine] Nausea Only   Starlix [nateglinide] Other (See Comments)   gassy      Medication List        Accurate as of 09/29/17  9:29 PM. Always use your most recent med list.          ACCU-CHEK GUIDE test strip Generic drug:  glucose blood USE TO CHECK BLOOD SUGAR 3 TIMES DAILY   acetaminophen 325 MG tablet Commonly known as:  TYLENOL Take 2 tablets (650 mg total) by mouth every 4 (four) hours as needed for headache or mild pain.   atorvastatin 40 MG tablet Commonly known as:  LIPITOR Take 40 mg by mouth daily.   bisoprolol 5 MG tablet Commonly known as:  ZEBETA Take 1.5 tablets (7.5 mg total) by mouth daily.   DIGOX 0.125 MG tablet Generic drug:  digoxin TAKE ONE TABLET BY MOUTH EVERY DAY   fluticasone 50 MCG/ACT nasal spray Commonly known as:  FLONASE Place 2 sprays into both nostrils daily.   gabapentin 100 MG capsule Commonly known as:  NEURONTIN TAKE TWO CAPSULES BY MOUTH THREE TIMES DAILY   guaiFENesin 600 MG 12 hr tablet Commonly known as:  MUCINEX Take 2 tablets (1,200 mg total) by mouth 2 (two) times daily.   hyoscyamine 0.125 MG SL tablet Commonly known as:  LEVSIN SL TAKE 1 TO 2 TABLETS BY MOUTH EVERY 6 HOURS AS NEEDED FOR DIARRHEA   Icosapent Ethyl 1 g Caps 2 capsules twice a day   insulin regular human CONCENTRATED 500 UNIT/ML injection Commonly known as:  HUMULIN R INJECT 56 UNITS SUBCUTANEOUSLY DAILY IN INSULIN PUMP   isosorbide mononitrate 30 MG 24 hr tablet Commonly known as:  IMDUR TAKE ONE TABLET BY MOUTH EVERY DAY   lipase/protease/amylase 36000 UNITS Cpep capsule Commonly known as:  CREON Take 2 capsules (72,000 Units total) by mouth 3 (three) times daily with meals.   lisinopril 10 MG tablet Commonly known as:   PRINIVIL,ZESTRIL Take 1 tablet (10 mg total) by mouth daily.   mirtazapine 45  MG tablet Commonly known as:  REMERON Take 1 tablet (45 mg total) by mouth at bedtime.   multivitamin with minerals Tabs tablet Take 1 tablet by mouth daily.   nitroGLYCERIN 0.4 MG SL tablet Commonly known as:  NITROSTAT PLACE 1 TABLET UNDER THE TONGUE EVERY 5 MINUTES AS NEEDED FOR CHEST PAIN   OXYGEN Inhale 2 L into the lungs.   PROAIR HFA 108 (90 Base) MCG/ACT inhaler Generic drug:  albuterol INHALE 2 PUFFS INTO THE LUNGS EVERY 4 HOURS AS NEEDED FOR WHEEZING ORSHORTNESS OF BREATH   rivaroxaban 20 MG Tabs tablet Commonly known as:  XARELTO Take 1 tablet (20 mg total) by mouth daily with supper.   sertraline 25 MG tablet Commonly known as:  ZOLOFT TAKE TWO TABLETS BY MOUTH EVERY DAY   spironolactone 25 MG tablet Commonly known as:  ALDACTONE TAKE ONE TABLET BY MOUTH EVERY DAY   SYMBICORT 160-4.5 MCG/ACT inhaler Generic drug:  budesonide-formoterol INHALE 2 PUFFS INTO THE LUNGS TWICE DAILY   SYNJARDY 05-998 MG Tabs Generic drug:  Empagliflozin-metFORMIN HCl TAKE ONE TABLET BY MOUTH TWICE DAILY AFTER A MEAL   tamsulosin 0.4 MG Caps capsule Commonly known as:  FLOMAX TAKE 1 CAPSULE BY MOUTH EVERY DAY AFTER SUPPER   torsemide 20 MG tablet Commonly known as:  DEMADEX TAKE TWO TABLETS BY MOUTH EVERY DAY   V-GO 20 Kit Apply one Vgo 20 pump daily.   vitamin C 1000 MG tablet Take 1,000 mg by mouth daily.   Vitamin D-3 1000 units Caps Take 1,000 Units by mouth daily. Reported on 02/26/2015       Allergies:  Allergies  Allergen Reactions  . Morphine And Related Other (See Comments)    Drenched with perspiration  . Entresto [Sacubitril-Valsartan]     hypotension  . Demerol [Meperidine] Nausea Only  . Starlix [Nateglinide] Other (See Comments)    gassy    Past Medical History:  Diagnosis Date  . Acute respiratory distress 06/04/2016  . Adenomatous colon polyp 10/16/2011    Repeat 2018  . Arthritis     Hips, R>L & KNEES  . CAD, multiple vessel    3V CAD cath 08/08/13----CABG done shortly after.  Cath 06/2016: clear bipasses/no interventional lesion.  . 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 pylori neg, no dysplasia or malignancy.  . Chronic combined systolic and diastolic CHF, NYHA class 2 ([place]) 2016-2018   Followed by [facility].  Transesoph Echo 09/2016 EF improved to 40-45%; diffuse hypokinesis, Grd I DD.  [name] Chronic renal insufficiency, stage 3 (moderate) (HCC) 2018   GFR 50s  . COPD (chronic obstructive pulmonary disease) ([place])    GOLD II.  Spirometry  2004 borderline obstruction; 2015 mod obst: noncompliant with bid symbicort so pulmonologist switched him to a once daily inhaler: Trelegy ellipta 12/2015.  Oxygen prn: goal 88-92%.  . Diabetic nephropathy ([place])    Elevated urine microalb/cr 03/2011  . DM type 2 (diabetes mellitus, type 2) ([place])    Poor control on max oral meds--pt eventually agreed to insulin therapy.  As of 2017 his DM is being managed by Dr. Dwyane Dee in endo.  On insulin pump as of 04/2017 endo f/u.  [name] DOE (dyspnea on exertion)    COPD + chronic diastolic HF  . Dysthymia    Worsened after 2015 heart surgery.  Memory loss +? as well.  Neuro eval 01/2017--plan is to get MRI brain, B12 level, and neuropsychiatric testing.  . Erectile dysfunction  Normal testosterone  . Hearing loss of both ears 2016   Hearing aids  . Hyperplastic colon polyp 2001   No further colonoscopies to be done as of 11/2016 GI eval.  . Hypertension   . IBS (irritable bowel syndrome)    responds relatively well to prn levsin.  Pepto bismol q AM trial by Dr. Loletha Carrow 08/2017, also checking fecal elastace and lactoferrin.  . Iron deficiency anemia 2014   03/2012 capsule endoscopy showed 2 AVMs--likely responsible for his IDA--lifetime iron supp recommended + q57moCBCs.  . Ischemic cardiomyopathy 06/2016; 09/2016   06/2016: EF 25-30%,  global hypokinesis, grd II DD, vent septum motion changes c/w LBBB, mod MV regurg, L atr mod/sev dilat, R atr mod dilat, mod incr pulm press, Grd II DD.  09/2016--EF 40-45%; diffuse hypokinesis, Grd I DD.  . Macular degeneration, dry    Mild, bilat (Optometrist, DMayford Knifeat [employer]  . Mild cognitive impairment    MCI, likley vascular etiology, + MDD, recurrent-->neuropsych testing 06/2017.  Neurol to repeat testing 1 yr  . Mixed hyperlipidemia    statin and vascepa as of 04/2017  . Nocturnal hypoxemia   . Obesity   . Open toe wound 11/2015   Wound care clinic appt made and then canceled when toe improved.  . Other and unspecified angina pectoris   . PAD (peripheral artery disease) ([place] 02/2016   Abnormal ABI's and waveforms: LE arterial duplex ordered for f/u as per cardiologist's recommendation.  Vasc eval by Dr. CBridgett Larsson impression was minimal PAD, recommended maximize med mgmt.  .Marland KitchenPAF (paroxysmal atrial fibrillation) ([place] 10/2014   xarelto  . Pericardial effusion with cardiac tamponade 6//29/15   pericardiocentesis was done,  Infectious/inflamm (cytology showed NO MALIGNANT CELLS)  . Pleural plaque    Pleural plaques/asbestosis changes on CT chest done by pulm 05/2016.  .Marland KitchenPneumonia   . Tobacco dependence in remission    100+ pack-yr hx: quit after CABG    Past Surgical History:  Procedure Laterality Date  . APPENDECTOMY  1957  . CARDIAC CATHETERIZATION  08/08/2013  . CATARACT EXTRACTION W/ INTRAOCULAR LENS  IMPLANT, BILATERAL  04/08/2006 & 04/22/2006  . CATARACT EXTRACTION W/ INTRAOCULAR LENS  IMPLANT, BILATERAL Bilateral   . CHOLECYSTECTOMY OPEN  1999  . COLONOSCOPY  10/16/2011   Procedure: COLONOSCOPY;  Surgeon: RInda Castle MD;  Location: [facility];  Service: Endoscopy;  Laterality: N/A;  . CORONARY ARTERY BYPASS GRAFT N/A 08/14/2013   Procedure: CORONARY ARTERY BYPASS GRAFTING (CABG);  Surgeon: SMelrose Nakayama MD;  [place]   Service: Open Heart Surgery;  Laterality: N/A;  Times 4   using left internal mammary artery and endoscopically harvested bilateral saphenous vein  . ESOPHAGOGASTRODUODENOSCOPY  02/25/12   Atrophic gastritis with a few erosions--capsule endoscopy planned as of 02/25/12 (Dr. KDeatra Ina.  .Marland KitchenHARDWARE REMOVAL Right 08/12/2012   Procedure: HARDWARE REMOVAL;  Surgeon: CMcarthur Rossetti MD;  Location: [facility];  Service: Orthopedics;  Laterality: Right;  . HIP SURGERY Right 1954   Repair of slipped capital femoral epiphysis.  . INTRAOPERATIVE TRANSESOPHAGEAL ECHOCARDIOGRAM N/A 08/14/2013   Normal LV function. Procedure: INTRAOPERATIVE TRANSESOPHAGEAL ECHOCARDIOGRAM;  Surgeon: SMelrose Nakayama MD;  [place] [facility] [place]  Service: Open Heart Surgery;  Laterality: N/A;  . LEFT HEART CATHETERIZATION WITH CORONARY ANGIOGRAM N/A 08/08/2013   Procedure: LEFT HEART CATHETERIZATION WITH CORONARY ANGIOGRAM;  Surgeon: JJettie Booze MD;  [facility];  Service: Cardiovascular;  Laterality: N/A;  .  PATELLA FRACTURE SURGERY Left ~ 1979   bolt + 3 screws to repair tib plateau fx  . PERICARDIAL TAP N/A 07/01/2013   Procedure: PERICARDIAL TAP;  Surgeon: Jettie Booze, MD;  Location: [facility] CATH LAB;  Service: Cardiovascular;  Laterality: N/A;  . RIGHT/LEFT HEART CATH AND CORONARY ANGIOGRAPHY N/A 06/10/2016   Procedure: Right/Left Heart Cath and Coronary Angiography;  Surgeon: Larey Dresser, MD;  Location: [facility] LAB;  Service: Cardiovascular;  Laterality: N/A;  . TESTICLE SURGERY  as a child   Undescended testicle brought down into scrotum  . TONSILLECTOMY  1947  . TOTAL HIP ARTHROPLASTY Right 08/12/2012   Procedure: REMOVAL OF OLD PINS RIGHT HIP AND RIGHT TOTAL HIP ARTHROPLASTY ANTERIOR APPROACH;  Surgeon: Mcarthur Rossetti, MD;  Location: [facility];  Service: Orthopedics;  Laterality: Right;  . TRANSESOPHAGEAL ECHOCARDIOGRAM  09/22/2016   EF 40-45%; diffuse hypokinesis, Grd I DD.  [name]  TRANSTHORACIC ECHOCARDIOGRAM  10/30/14   Mod LVH, EF 60-65%, normal wall motion, mod mitral regurg, mild PAH  . TRANSTHORACIC ECHOCARDIOGRAM  06/2016   06/2016: EF 25-30%, global hypokinesis, grd II DD, vent septum motion changes c/w LBBB, mod MV regurg, L atr mod/sev dilat, R atr mod dilat, mod incr pulm press, Grd II DD.    Family History  Problem Relation Age of Onset  . Heart disease Father   . Heart attack Father   . CVA Mother   . Hypertension Mother   . Diabetes Paternal Grandmother   . Breast cancer Sister   . Diabetes Maternal Uncle   . Heart disease Maternal Uncle   . Stroke Neg Hx     Social History:  reports that he quit smoking about 23 months ago. His smoking use included cigarettes. He has a 15.50 pack-year smoking history. He quit smokeless tobacco use about 4 years ago. He reports that he does not drink alcohol or use drugs.   Review of Systems  He is complaining of weakness He says that he is also not able to move around much and gets tired and weak when he is trying to walk  DIARRHEA: He think this is getting better with starting Creon   Lipid history: He had been taking Lipitor 40 mg Has had good control of LDL but he has increased triglycerides Has been prescribed Vascepa was refilled in August    Lab Results  Component Value Date   CHOL 121 04/26/2017   HDL 31.80 (L) 04/26/2017   LDLCALC 5 02/23/2017   LDLDIRECT 47.0 04/26/2017   TRIG 397.0 (H) 04/26/2017   CHOLHDL 4 04/26/2017           Hypertension:Mild and controlled with bisoprolol 5 mg,25 mg losartan He is on diuretics for CHF  Followed by cardiologist   BP Readings from Last 3 Encounters:  09/29/17 (!) 98/52  08/20/17 (!) 124/38  08/18/17 (!) 108/52   Lab Results  Component Value Date   K 4.6 08/18/2017     Most recent eye exam was 02/2017, has had no retinopathy  Most recent foot exam: 12/17   LABS:  Office Visit on 09/29/2017  Component Date Value Ref Range Status  .  Hemoglobin A1C 09/29/2017 8.3* 4.0 - 5.6 % Final  . POC Glucose 09/29/2017 68* 70 - 99 mg/dl Final    Physical Examination:  BP (!) 98/52   Pulse (!) 51   Ht 5' 11" (1.803 m)   Wt 224 lb (101.6 kg)   BMI 31.24 kg/m  ASSESSMENT:  Diabetes type 2, uncontrolled, on the V-go insulin pump with U-500 insulin  See history of present illness for detailed discussion of current diabetes management, blood sugar patterns and problems identified  His A1c is 8.3  His blood sugars are variable Recently has had decreased and variable appetite and also some issues with diarrhea probably making his blood sugars more inconsistent also As before he does not bolus 30 minutes before eating and doing this right before eating Also is not adjusting his boluses based on how much he is eating which may cause sporadic hypoglycemia also including low normal sugar today in the office Day-to-day management of his diabetes, insulin pump and boluses was reviewed in detail  LIPIDS: Has history of high triglycerides and will need to get fasting labs with his PCP  Weakness: This is likely to be from low normal blood pressure along with mild bradycardia  PLAN:    Discussed needing to adjust his boluses based on what he is eating He can reduce his bolus by 1-2 clicks if he does not feel like he may eat as much and try to bolus 30-minute before eating He could take additional clicks right after eating once he is able to figure out how much he has eaten Needs to have balanced meals with some carbohydrate Change pump at the same time daily Patient is not using expired insulin  Check blood sugars 4 times a day and discussed the importance of checking sugars after evening meal to help adjust his boluses Meanwhile to reduce chances of low sugars overnight he can reduce the boluses to 5 clicks at suppertime instead of 6  Until seen by cardiologist he can reduce his bisoprolol to half a tablet for his low blood  pressure and low normal pulse Also needs to see his PCP for his weakness and other ongoing problems  Patient Instructions  5 Clicks at supper Check sugar 4x daily  Cut Bisoprolol in 1/2   Counseling time on subjects discussed in assessment and plan sections is over 50% of today's 25 minute visit  Influenza vaccine given   Elayne Snare 09/29/2017, 9:29 PM   Note: This office note was prepared with Dragon voice recognition system technology. Any transcriptional errors that result from this process are unintentional.    Elayne Snare

## 2017-09-29 NOTE — Telephone Encounter (Signed)
[name] H[facility] stating "She is returning a call. Her husband left a message yesterday morning. Her husband is extremely weak and blood sugars are high and low. His sugar was 458 yesterday morning. His sugar was 167 at 4:35 P.M. Today. The weakness has been going on for 4 weeks and is getting worse."  Call back # [phone]

## 2017-09-29 NOTE — Patient Instructions (Addendum)
5 Clicks at supper Check sugar 4x daily  Cut Bisoprolol in 1/2

## 2017-09-30 ENCOUNTER — Encounter (HOSPITAL_COMMUNITY): Payer: Self-pay | Admitting: Emergency Medicine

## 2017-09-30 ENCOUNTER — Observation Stay: Payer: Self-pay

## 2017-09-30 ENCOUNTER — Emergency Department (HOSPITAL_COMMUNITY): Payer: Medicare Other

## 2017-09-30 ENCOUNTER — Inpatient Hospital Stay (HOSPITAL_COMMUNITY)
Admission: EM | Admit: 2017-09-30 | Discharge: 2017-10-03 | DRG: 682 | Disposition: A | Discharge status: Home / Self Care | Payer: Medicare Other | Attending: Internal Medicine | Admitting: Internal Medicine

## 2017-09-30 ENCOUNTER — Other Ambulatory Visit: Payer: Self-pay

## 2017-09-30 DIAGNOSIS — J449 Chronic obstructive pulmonary disease, unspecified: Secondary | ICD-10-CM | POA: Diagnosis not present

## 2017-09-30 DIAGNOSIS — Z951 Presence of aortocoronary bypass graft: Secondary | ICD-10-CM | POA: Diagnosis not present

## 2017-09-30 DIAGNOSIS — I447 Left bundle-branch block, unspecified: Secondary | ICD-10-CM | POA: Diagnosis present

## 2017-09-30 DIAGNOSIS — Z79899 Other long term (current) drug therapy: Secondary | ICD-10-CM

## 2017-09-30 DIAGNOSIS — Z77090 Contact with and (suspected) exposure to asbestos: Secondary | ICD-10-CM | POA: Diagnosis present

## 2017-09-30 DIAGNOSIS — Z888 Allergy status to other drugs, medicaments and biological substances status: Secondary | ICD-10-CM | POA: Diagnosis not present

## 2017-09-30 DIAGNOSIS — E1122 Type 2 diabetes mellitus with diabetic chronic kidney disease: Secondary | ICD-10-CM | POA: Diagnosis present

## 2017-09-30 DIAGNOSIS — Z96641 Presence of right artificial hip joint: Secondary | ICD-10-CM | POA: Diagnosis not present

## 2017-09-30 DIAGNOSIS — Z7901 Long term (current) use of anticoagulants: Secondary | ICD-10-CM

## 2017-09-30 DIAGNOSIS — I130 Hypertensive heart and chronic kidney disease with heart failure and stage 1 through stage 4 chronic kidney disease, or unspecified chronic kidney disease: Secondary | ICD-10-CM | POA: Diagnosis present

## 2017-09-30 DIAGNOSIS — I5023 Acute on chronic systolic (congestive) heart failure: Secondary | ICD-10-CM

## 2017-09-30 DIAGNOSIS — T887XXA Unspecified adverse effect of drug or medicament, initial encounter: Secondary | ICD-10-CM | POA: Diagnosis present

## 2017-09-30 DIAGNOSIS — N183 Chronic kidney disease, stage 3 (moderate): Secondary | ICD-10-CM | POA: Diagnosis not present

## 2017-09-30 DIAGNOSIS — Z885 Allergy status to narcotic agent status: Secondary | ICD-10-CM

## 2017-09-30 DIAGNOSIS — I10 Essential (primary) hypertension: Secondary | ICD-10-CM | POA: Diagnosis present

## 2017-09-30 DIAGNOSIS — R531 Weakness: Secondary | ICD-10-CM

## 2017-09-30 DIAGNOSIS — E782 Mixed hyperlipidemia: Secondary | ICD-10-CM | POA: Diagnosis present

## 2017-09-30 DIAGNOSIS — E875 Hyperkalemia: Secondary | ICD-10-CM | POA: Diagnosis not present

## 2017-09-30 DIAGNOSIS — H9193 Unspecified hearing loss, bilateral: Secondary | ICD-10-CM | POA: Diagnosis present

## 2017-09-30 DIAGNOSIS — R001 Bradycardia, unspecified: Secondary | ICD-10-CM | POA: Diagnosis not present

## 2017-09-30 DIAGNOSIS — I5043 Acute on chronic combined systolic (congestive) and diastolic (congestive) heart failure: Secondary | ICD-10-CM | POA: Diagnosis present

## 2017-09-30 DIAGNOSIS — E1151 Type 2 diabetes mellitus with diabetic peripheral angiopathy without gangrene: Secondary | ICD-10-CM | POA: Diagnosis present

## 2017-09-30 DIAGNOSIS — I2510 Atherosclerotic heart disease of native coronary artery without angina pectoris: Secondary | ICD-10-CM | POA: Diagnosis present

## 2017-09-30 DIAGNOSIS — Z7951 Long term (current) use of inhaled steroids: Secondary | ICD-10-CM

## 2017-09-30 DIAGNOSIS — I5032 Chronic diastolic (congestive) heart failure: Secondary | ICD-10-CM | POA: Diagnosis present

## 2017-09-30 DIAGNOSIS — H3530 Unspecified macular degeneration: Secondary | ICD-10-CM | POA: Diagnosis not present

## 2017-09-30 DIAGNOSIS — N170 Acute kidney failure with tubular necrosis: Secondary | ICD-10-CM | POA: Diagnosis not present

## 2017-09-30 DIAGNOSIS — Z794 Long term (current) use of insulin: Secondary | ICD-10-CM

## 2017-09-30 DIAGNOSIS — E118 Type 2 diabetes mellitus with unspecified complications: Secondary | ICD-10-CM | POA: Diagnosis present

## 2017-09-30 DIAGNOSIS — F17210 Nicotine dependence, cigarettes, uncomplicated: Secondary | ICD-10-CM | POA: Diagnosis present

## 2017-09-30 DIAGNOSIS — J849 Interstitial pulmonary disease, unspecified: Secondary | ICD-10-CM | POA: Diagnosis not present

## 2017-09-30 DIAGNOSIS — E669 Obesity, unspecified: Secondary | ICD-10-CM | POA: Diagnosis present

## 2017-09-30 DIAGNOSIS — I959 Hypotension, unspecified: Secondary | ICD-10-CM | POA: Diagnosis not present

## 2017-09-30 DIAGNOSIS — R7989 Other specified abnormal findings of blood chemistry: Secondary | ICD-10-CM | POA: Diagnosis present

## 2017-09-30 DIAGNOSIS — I340 Nonrheumatic mitral (valve) insufficiency: Secondary | ICD-10-CM | POA: Diagnosis not present

## 2017-09-30 DIAGNOSIS — I480 Paroxysmal atrial fibrillation: Secondary | ICD-10-CM | POA: Diagnosis not present

## 2017-09-30 DIAGNOSIS — Z6831 Body mass index (BMI) 31.0-31.9, adult: Secondary | ICD-10-CM

## 2017-09-30 DIAGNOSIS — N179 Acute kidney failure, unspecified: Principal | ICD-10-CM

## 2017-09-30 DIAGNOSIS — E785 Hyperlipidemia, unspecified: Secondary | ICD-10-CM | POA: Diagnosis present

## 2017-09-30 DIAGNOSIS — Z452 Encounter for adjustment and management of vascular access device: Secondary | ICD-10-CM

## 2017-09-30 DIAGNOSIS — I255 Ischemic cardiomyopathy: Secondary | ICD-10-CM | POA: Diagnosis present

## 2017-09-30 DIAGNOSIS — F17200 Nicotine dependence, unspecified, uncomplicated: Secondary | ICD-10-CM | POA: Diagnosis present

## 2017-09-30 LAB — CBC WITH DIFFERENTIAL/PLATELET
ABS IMMATURE GRANULOCYTES: 0 10*3/uL (ref 0.0–0.1)
Basophils Absolute: 0.1 10*3/uL (ref 0.0–0.1)
Basophils Relative: 1 %
Eosinophils Absolute: 0.1 10*3/uL (ref 0.0–0.7)
Eosinophils Relative: 1 %
HEMATOCRIT: 48.7 % (ref 39.0–52.0)
HEMOGLOBIN: 15.4 g/dL (ref 13.0–17.0)
IMMATURE GRANULOCYTES: 0 %
LYMPHS ABS: 1.5 10*3/uL (ref 0.7–4.0)
LYMPHS PCT: 17 %
MCH: 27.5 pg (ref 26.0–34.0)
MCHC: 31.6 g/dL (ref 30.0–36.0)
MCV: 87.1 fL (ref 78.0–100.0)
Monocytes Absolute: 0.8 10*3/uL (ref 0.1–1.0)
Monocytes Relative: 9 %
NEUTROS ABS: 6.6 10*3/uL (ref 1.7–7.7)
NEUTROS PCT: 72 %
Platelets: 163 10*3/uL (ref 150–400)
RBC: 5.59 MIL/uL (ref 4.22–5.81)
RDW: 16.9 % — AB (ref 11.5–15.5)
WBC: 9.2 10*3/uL (ref 4.0–10.5)

## 2017-09-30 LAB — I-STAT CG4 LACTIC ACID, ED
Lactic Acid, Venous: 2.65 mmol/L (ref 0.5–1.9)
Lactic Acid, Venous: 2.29 mmol/L (ref 0.5–1.9)

## 2017-09-30 LAB — URINALYSIS, ROUTINE W REFLEX MICROSCOPIC
Bilirubin Urine: NEGATIVE
Glucose, UA: NEGATIVE mg/dL
Hgb urine dipstick: NEGATIVE
Ketones, ur: NEGATIVE mg/dL
LEUKOCYTES UA: NEGATIVE
Nitrite: NEGATIVE
PROTEIN: NEGATIVE mg/dL
Specific Gravity, Urine: 1.008 (ref 1.005–1.030)
pH: 5 (ref 5.0–8.0)

## 2017-09-30 LAB — I-STAT CHEM 8, ED
BUN: 114 mg/dL — ABNORMAL HIGH (ref 8–23)
CALCIUM ION: 1.13 mmol/L — AB (ref 1.15–1.40)
CHLORIDE: 110 mmol/L (ref 98–111)
Creatinine, Ser: 2.1 mg/dL — ABNORMAL HIGH (ref 0.61–1.24)
GLUCOSE: 114 mg/dL — AB (ref 70–99)
HCT: 46 % (ref 39.0–52.0)
Hemoglobin: 15.6 g/dL (ref 13.0–17.0)
Potassium: 5.6 mmol/L — ABNORMAL HIGH (ref 3.5–5.1)
Sodium: 136 mmol/L (ref 135–145)
TCO2: 17 mmol/L — ABNORMAL LOW (ref 22–32)

## 2017-09-30 LAB — COMPREHENSIVE METABOLIC PANEL
ALBUMIN: 3.7 g/dL (ref 3.5–5.0)
ALK PHOS: 63 U/L (ref 38–126)
ALT: 16 U/L (ref 0–44)
AST: 18 U/L (ref 15–41)
Anion gap: 11 (ref 5–15)
BUN: 96 mg/dL — AB (ref 8–23)
CO2: 15 mmol/L — ABNORMAL LOW (ref 22–32)
Calcium: 9 mg/dL (ref 8.9–10.3)
Chloride: 111 mmol/L (ref 98–111)
Creatinine, Ser: 1.98 mg/dL — ABNORMAL HIGH (ref 0.61–1.24)
GFR calc Af Amer: 35 mL/min — ABNORMAL LOW (ref >60)
GFR calc non Af Amer: 31 mL/min — ABNORMAL LOW (ref >60)
GLUCOSE: 117 mg/dL — AB (ref 70–99)
POTASSIUM: 5.8 mmol/L — AB (ref 3.5–5.1)
Sodium: 137 mmol/L (ref 135–145)
TOTAL PROTEIN: 6.7 g/dL (ref 6.5–8.1)
Total Bilirubin: 0.4 mg/dL (ref 0.3–1.2)

## 2017-09-30 LAB — GLUCOSE, CAPILLARY
GLUCOSE-CAPILLARY: 163 mg/dL — AB (ref 70–99)
GLUCOSE-CAPILLARY: 250 mg/dL — AB (ref 70–99)

## 2017-09-30 LAB — I-STAT TROPONIN, ED: TROPONIN I, POC: 0.11 ng/mL — AB (ref 0.00–0.08)

## 2017-09-30 LAB — TROPONIN I: TROPONIN I: 0.2 ng/mL — AB (ref <0.03)

## 2017-09-30 LAB — DIGOXIN LEVEL
DIGOXIN LVL: 1.4 ng/mL (ref 0.8–2.0)
Digoxin Level: 1.2 ng/mL (ref 0.8–2.0)

## 2017-09-30 LAB — PROCALCITONIN: Procalcitonin: 0.11 ng/mL

## 2017-09-30 LAB — LACTIC ACID, PLASMA: Lactic Acid, Venous: 2.6 mmol/L (ref 0.5–1.9)

## 2017-09-30 LAB — MRSA PCR SCREENING: MRSA by PCR: NEGATIVE

## 2017-09-30 LAB — BRAIN NATRIURETIC PEPTIDE: B Natriuretic Peptide: 166.4 pg/mL — ABNORMAL HIGH (ref 0.0–100.0)

## 2017-09-30 MED ORDER — HYOSCYAMINE SULFATE 0.125 MG SL SUBL
0.1250 mg | SUBLINGUAL_TABLET | SUBLINGUAL | Status: DC | PRN
  Administered 2017-10-01: 0.25 mg via ORAL
  Filled 2017-09-30 (×2): qty 2

## 2017-09-30 MED ORDER — DEXTROSE 50 % IV SOLN
1.0000 | Freq: Once | INTRAVENOUS | Status: AC
  Administered 2017-09-30: 50 mL via INTRAVENOUS
  Filled 2017-09-30: qty 50

## 2017-09-30 MED ORDER — NITROGLYCERIN 0.4 MG SL SUBL: 0.4000 mg | SUBLINGUAL_TABLET | SUBLINGUAL | Status: DC | PRN

## 2017-09-30 MED ORDER — SODIUM CHLORIDE 0.9% FLUSH
10.0000 mL | INTRAVENOUS | Status: DC | PRN
  Administered 2017-10-03: 10 mL
  Filled 2017-09-30: qty 40

## 2017-09-30 MED ORDER — ONDANSETRON HCL 4 MG PO TABS: 4.0000 mg | ORAL_TABLET | Freq: Four times a day (QID) | ORAL | Status: DC | PRN

## 2017-09-30 MED ORDER — ACETAMINOPHEN 325 MG PO TABS: 650.0000 mg | ORAL_TABLET | Freq: Four times a day (QID) | ORAL | Status: DC | PRN

## 2017-09-30 MED ORDER — INSULIN ASPART 100 UNIT/ML ~~LOC~~ SOLN: 0.0000 [IU] | Freq: Three times a day (TID) | SUBCUTANEOUS | Status: DC

## 2017-09-30 MED ORDER — ALBUTEROL SULFATE (2.5 MG/3ML) 0.083% IN NEBU: 2.5000 mg | INHALATION_SOLUTION | RESPIRATORY_TRACT | Status: DC | PRN

## 2017-09-30 MED ORDER — MOMETASONE FURO-FORMOTEROL FUM 200-5 MCG/ACT IN AERO
2.0000 | INHALATION_SPRAY | Freq: Two times a day (BID) | RESPIRATORY_TRACT | Status: DC
  Filled 2017-09-30: qty 8.8

## 2017-09-30 MED ORDER — RIVAROXABAN 20 MG PO TABS
20.0000 mg | ORAL_TABLET | Freq: Every day | ORAL | Status: DC
  Administered 2017-09-30: 20:00:00 via ORAL
  Administered 2017-10-01 – 2017-10-02 (×2): 20 mg via ORAL
  Filled 2017-09-30 (×3): qty 1

## 2017-09-30 MED ORDER — ATORVASTATIN CALCIUM 40 MG PO TABS
40.0000 mg | ORAL_TABLET | Freq: Every day | ORAL | Status: DC
  Administered 2017-09-30 – 2017-10-02 (×3): 40 mg via ORAL
  Filled 2017-09-30 (×3): qty 1

## 2017-09-30 MED ORDER — SODIUM CHLORIDE 0.9% FLUSH: 3.0000 mL | Freq: Two times a day (BID) | INTRAVENOUS | Status: DC

## 2017-09-30 MED ORDER — INSULIN ASPART 100 UNIT/ML ~~LOC~~ SOLN
0.0000 [IU] | Freq: Every day | SUBCUTANEOUS | Status: DC
  Administered 2017-09-30: 2 [IU] via SUBCUTANEOUS
  Administered 2017-10-01: 5 [IU] via SUBCUTANEOUS
  Administered 2017-10-02: 4 [IU] via SUBCUTANEOUS

## 2017-09-30 MED ORDER — GUAIFENESIN ER 600 MG PO TB12
1200.0000 mg | ORAL_TABLET | Freq: Two times a day (BID) | ORAL | Status: DC
  Administered 2017-09-30 – 2017-10-03 (×6): 1200 mg via ORAL
  Filled 2017-09-30 (×6): qty 2

## 2017-09-30 MED ORDER — LACTATED RINGERS IV SOLN
INTRAVENOUS | Status: DC
  Administered 2017-09-30: via INTRAVENOUS

## 2017-09-30 MED ORDER — ONDANSETRON HCL 4 MG/2ML IJ SOLN: 4.0000 mg | Freq: Four times a day (QID) | INTRAMUSCULAR | Status: DC | PRN

## 2017-09-30 MED ORDER — SODIUM CHLORIDE 0.9 % IV SOLN
1.0000 g | Freq: Once | INTRAVENOUS | Status: AC
  Administered 2017-09-30: 1 g via INTRAVENOUS
  Filled 2017-09-30: qty 10

## 2017-09-30 MED ORDER — OMEGA-3-ACID ETHYL ESTERS 1 G PO CAPS
2.0000 g | ORAL_CAPSULE | Freq: Two times a day (BID) | ORAL | Status: DC
  Administered 2017-09-30 – 2017-10-03 (×6): 2 g via ORAL
  Filled 2017-09-30 (×9): qty 2

## 2017-09-30 MED ORDER — TAMSULOSIN HCL 0.4 MG PO CAPS
0.4000 mg | ORAL_CAPSULE | Freq: Every day | ORAL | Status: DC
  Administered 2017-09-30 – 2017-10-02 (×3): 0.4 mg via ORAL
  Filled 2017-09-30 (×3): qty 1

## 2017-09-30 MED ORDER — GABAPENTIN 100 MG PO CAPS
200.0000 mg | ORAL_CAPSULE | Freq: Two times a day (BID) | ORAL | Status: DC
  Administered 2017-09-30 – 2017-10-03 (×6): 200 mg via ORAL
  Filled 2017-09-30 (×6): qty 2

## 2017-09-30 MED ORDER — INSULIN PUMP: Freq: Three times a day (TID) | SUBCUTANEOUS | Status: DC

## 2017-09-30 MED ORDER — SERTRALINE HCL 50 MG PO TABS
50.0000 mg | ORAL_TABLET | Freq: Every day | ORAL | Status: DC
  Administered 2017-09-30 – 2017-10-02 (×3): 50 mg via ORAL
  Filled 2017-09-30 (×3): qty 1

## 2017-09-30 MED ORDER — ACETAMINOPHEN 650 MG RE SUPP: 650.0000 mg | Freq: Four times a day (QID) | RECTAL | Status: DC | PRN

## 2017-09-30 MED ORDER — DOCUSATE SODIUM 100 MG PO CAPS
100.0000 mg | ORAL_CAPSULE | Freq: Two times a day (BID) | ORAL | Status: DC
  Administered 2017-09-30 – 2017-10-02 (×4): 100 mg via ORAL
  Filled 2017-09-30 (×4): qty 1

## 2017-09-30 MED ORDER — MIRTAZAPINE 15 MG PO TABS
45.0000 mg | ORAL_TABLET | Freq: Every day | ORAL | Status: DC
  Administered 2017-09-30 – 2017-10-02 (×3): 45 mg via ORAL
  Filled 2017-09-30 (×3): qty 3

## 2017-09-30 MED ORDER — INSULIN ASPART 100 UNIT/ML ~~LOC~~ SOLN
0.0000 [IU] | Freq: Three times a day (TID) | SUBCUTANEOUS | Status: DC
  Administered 2017-10-01: 8 [IU] via SUBCUTANEOUS
  Administered 2017-10-01: 11 [IU] via SUBCUTANEOUS
  Administered 2017-10-01: 8 [IU] via SUBCUTANEOUS
  Administered 2017-10-02: 15 [IU] via SUBCUTANEOUS
  Administered 2017-10-02: 8 [IU] via SUBCUTANEOUS
  Administered 2017-10-02: 11 [IU] via SUBCUTANEOUS
  Administered 2017-10-03: 15 [IU] via SUBCUTANEOUS
  Administered 2017-10-03: 11 [IU] via SUBCUTANEOUS

## 2017-09-30 MED ORDER — SODIUM CHLORIDE 0.9 % IV BOLUS
500.0000 mL | Freq: Once | INTRAVENOUS | Status: AC
  Administered 2017-09-30: 500 mL via INTRAVENOUS

## 2017-09-30 MED ORDER — INSULIN ASPART 100 UNIT/ML ~~LOC~~ SOLN
5.0000 [IU] | Freq: Once | SUBCUTANEOUS | Status: AC
  Administered 2017-09-30: 5 [IU] via INTRAVENOUS
  Filled 2017-09-30: qty 1

## 2017-09-30 NOTE — Telephone Encounter (Signed)
See separate telephone note.  Patient's wife called back and verbalized understanding to take patient to ER.

## 2017-09-30 NOTE — Telephone Encounter (Signed)
Patient seen for appointment in regards to this, no call back needed for this message.

## 2017-09-30 NOTE — ED Provider Notes (Signed)
[facility] EMERGENCY DEPARTMENT Provider Note   CSN: [id number] Arrival date & time: 09/30/17  1143     History   Chief Complaint Chief Complaint  Patient presents with  . Weakness    HPI ED MANDICH is a 77 y.o. male.  HPI  76 year old male with a history of coronary artery disease, COPD, diabetes, hypertension, ischemic cardiomyopathy, atrial fibrillation, peripheral artery disease, pericardial effusion, presents with concern for generalized weakness for 3 weeks.  Reports significant fatigue.  Denies chest pain, shortness of breath, vomiting, fevers, cough, urinary symptoms.  Does report that he is had some diarrhea intermittently, as well as for the last 2 days.  Reports decreased appetite.  Began taking Creon 3 weeks ago.  Denies any other medication changes.  Past Medical History:  Diagnosis Date  . Acute respiratory distress 06/04/2016  . Adenomatous colon polyp 10/16/2011   Repeat 2018  . Arthritis     Hips, R>L & KNEES  . CAD, multiple vessel    3V CAD cath 08/08/13----CABG done shortly after.  Cath 06/2016: clear bipasses/no interventional lesion.  . 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 pylori neg, no dysplasia or malignancy.  . Chronic combined systolic and diastolic CHF, NYHA class 2 ([place]) 2016-2018   Followed by [facility].  Transesoph Echo 09/2016 EF improved to 40-45%; diffuse hypokinesis, Grd I DD.  [name] Chronic renal insufficiency, stage 3 (moderate) (HCC) 2018   GFR 50s  . COPD (chronic obstructive pulmonary disease) ([place])    GOLD II.  Spirometry  2004 borderline obstruction; 2015 mod obst: noncompliant with bid symbicort so pulmonologist switched him to a once daily inhaler: Trelegy ellipta 12/2015.  Oxygen prn: goal 88-92%.  . Diabetic nephropathy ([place])    Elevated urine microalb/cr 03/2011  . DM type 2 (diabetes mellitus, type 2) ([place])    Poor control on max oral meds--pt eventually agreed to  insulin therapy.  As of 2017 his DM is being managed by Dr. Dwyane Dee in endo.  On insulin pump as of 04/2017 endo f/u.  [name] DOE (dyspnea on exertion)    COPD + chronic diastolic HF  . Dysthymia    Worsened after 2015 heart surgery.  Memory loss +? as well.  Neuro eval 01/2017--plan is to get MRI brain, B12 level, and neuropsychiatric testing.  . Erectile dysfunction    Normal testosterone  . Hearing loss of both ears 2016   Hearing aids  . Hyperplastic colon polyp 2001   No further colonoscopies to be done as of 11/2016 GI eval.  . Hypertension   . IBS (irritable bowel syndrome)    responds relatively well to prn levsin.  Pepto bismol q AM trial by Dr. Loletha Carrow 08/2017, also checking fecal elastace and lactoferrin.  . 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  . Ischemic cardiomyopathy 06/2016; 09/2016   06/2016: EF 25-30%, global hypokinesis, grd II DD, vent septum motion changes c/w LBBB, mod MV regurg, L atr mod/sev dilat, R atr mod dilat, mod incr pulm press, Grd II DD.  09/2016--EF 40-45%; diffuse hypokinesis, Grd I DD.  . Macular degeneration, dry    Mild, bilat (Optometrist, DMayford Knifeat [employer]of [place]  . Mild cognitive impairment    MCI, likley vascular etiology, + MDD, recurrent-->neuropsych testing 06/2017.  Neurol to repeat testing 1 yr  . Mixed hyperlipidemia  statin and vascepa as of 04/2017  . Nocturnal hypoxemia   . Obesity   . Open toe wound 11/2015   Wound care clinic appt made and then canceled when toe improved.  . Other and unspecified angina pectoris   . PAD (peripheral artery disease) ([place]) 02/2016   Abnormal ABI's and waveforms: LE arterial duplex ordered for f/u as per cardiologist's recommendation.  Vasc eval by Dr. Bridgett Larsson: impression was minimal PAD, recommended maximize med mgmt.  [name] PAF (paroxysmal atrial fibrillation) ([place]) 10/2014   xarelto    . Pericardial effusion with cardiac tamponade 6//29/15   pericardiocentesis was done,  Infectious/inflamm (cytology showed NO MALIGNANT CELLS)  . Pleural plaque    Pleural plaques/asbestosis changes on CT chest done by pulm 05/2016.  [name] Pneumonia   . Tobacco dependence in remission    100+ pack-yr hx: quit after CABG    Patient Active Problem List   Diagnosis Date Noted  . Symptomatic bradycardia 09/30/2017  . Acute renal failure superimposed on stage 3 chronic kidney disease ([place]) 09/30/2017  . Medication side effect   . Hypotension due to medication 08/27/2016  . Near syncope 08/27/2016  . Frequent falls 06/17/2016  . Weakness generalized 06/17/2016  . Chronic systolic CHF (congestive heart failure) ([place]) 06/17/2016  . AKI (acute kidney injury) ([place])   . Respiratory distress 06/04/2016  . Acute respiratory distress 06/04/2016  . Bronchopneumonia 06/04/2016  . Tachycardia 06/04/2016  . Hypoxia   . Chronic diastolic CHF (congestive heart failure) ([place]) 02/18/2015  . Acute upper respiratory infection 02/06/2015  . Facial cellulitis 11/28/2014  . Abscess of nasal cavity 11/28/2014  . Cellulitis and abscess of neck 11/28/2014  . Acute on chronic systolic and diastolic heart failure, NYHA class 2 ([place]) 11/12/2014  . Dyspnea 10/29/2014  . Cough 10/29/2014  . History of asbestosis 10/29/2014  . Low oxygen saturation 10/29/2014  . Chronic respiratory failure with hypoxia ([place]) 10/29/2014  . COPD exacerbation ([place]) 10/29/2014  . Acute on chronic respiratory failure with hypoxia ([place])   . Depression 06/11/2014  . Diabetes mellitus with complication ([place]) 62/13/0865  . Postoperative anemia due to acute blood loss 10/12/2013  . 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 Coronary artery disease- 3 V at cath 08/08/13 08/08/2013  . DOE (dyspnea on exertion)   . Acute on chronic respiratory failure ([place]) 07/02/2013  . PAF (paroxysmal atrial fibrillation) ([place]) 07/02/2013  . Acute bronchitis 07/01/2013   . Pleural effusion with elevated pro BNP 07/01/2013  . Pericardial effusion 07/01/2013  . Pericardial effusion with cardiac tamponade- s/p perciocentesis 06/30/13   . ACS (acute coronary syndrome) ([place]) 06/30/2013  . Fatigue 06/30/2013  . Nonspecific abnormal electrocardiogram (ECG) (EKG) 06/30/2013  . COPD with exacerbation ([place]) 06/30/2013  . Abnormal chest x-ray 10/03/2012  . 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 Arthritis   . COPD (chronic obstructive pulmonary disease) ([place])   . CKD (chronic kidney disease) stage 3, GFR 30-59 ml/min (HCC)   . Obesity   . Degenerative arthritis of hip 08/12/2012  . Preoperative clearance 07/07/2012  . Hyperlipidemia 03/16/2012  . Tobacco dependence   . Health maintenance examination 09/16/2011  . HTN (hypertension), benign 09/02/2011  . Prostate cancer screening 09/02/2011    Past Surgical History:  Procedure Laterality Date  . APPENDECTOMY  1957  . CARDIAC CATHETERIZATION  08/08/2013  . CATARACT EXTRACTION W/ INTRAOCULAR LENS  IMPLANT, BILATERAL  04/08/2006 & 04/22/2006  . CATARACT EXTRACTION W/ INTRAOCULAR LENS  IMPLANT, BILATERAL Bilateral   .  CHOLECYSTECTOMY OPEN  1999  . COLONOSCOPY  10/16/2011   Procedure: COLONOSCOPY;  Surgeon: Inda Castle, MD;  Location: [facility];  Service: Endoscopy;  Laterality: N/A;  . CORONARY ARTERY BYPASS GRAFT N/A 08/14/2013   Procedure: CORONARY ARTERY BYPASS GRAFTING (CABG);  Surgeon: Melrose Nakayama, MD;  Location: [facility];  Service: Open Heart Surgery;  Laterality: N/A;  Times 4   using left internal mammary artery and endoscopically harvested bilateral saphenous vein  . ESOPHAGOGASTRODUODENOSCOPY  02/25/12   Atrophic gastritis with a few erosions--capsule endoscopy planned as of 02/25/12 (Dr. Deatra Ina).  [name] HARDWARE REMOVAL Right 08/12/2012   Procedure: HARDWARE REMOVAL;  Surgeon: Mcarthur Rossetti, MD;  Location: [facility];  Service: Orthopedics;  Laterality: Right;  . HIP SURGERY Right 1954   Repair  of slipped capital femoral epiphysis.  . INTRAOPERATIVE TRANSESOPHAGEAL ECHOCARDIOGRAM N/A 08/14/2013   Normal LV function. Procedure: INTRAOPERATIVE TRANSESOPHAGEAL ECHOCARDIOGRAM;  Surgeon: Melrose Nakayama, MD;  Location: [facility];  Service: Open Heart Surgery;  Laterality: N/A;  . LEFT HEART CATHETERIZATION WITH CORONARY ANGIOGRAM N/A 08/08/2013   Procedure: LEFT HEART CATHETERIZATION WITH CORONARY ANGIOGRAM;  Surgeon: Jettie Booze, MD;  Location: [facility] CATH LAB;  Service: Cardiovascular;  Laterality: N/A;  . PATELLA FRACTURE SURGERY Left ~ 1979   bolt + 3 screws to repair tib plateau fx  . PERICARDIAL TAP N/A 07/01/2013   Procedure: PERICARDIAL TAP;  Surgeon: Jettie Booze, MD;  Location: [facility] CATH LAB;  Service: Cardiovascular;  Laterality: N/A;  . RIGHT/LEFT HEART CATH AND CORONARY ANGIOGRAPHY N/A 06/10/2016   Procedure: Right/Left Heart Cath and Coronary Angiography;  Surgeon: Larey Dresser, MD;  Location: [facility] CV LAB;  Service: Cardiovascular;  Laterality: N/A;  . TESTICLE SURGERY  as a child   Undescended testicle brought down into scrotum  . TONSILLECTOMY  1947  . TOTAL HIP ARTHROPLASTY Right 08/12/2012   Procedure: REMOVAL OF OLD PINS RIGHT HIP AND RIGHT TOTAL HIP ARTHROPLASTY ANTERIOR APPROACH;  Surgeon: Mcarthur Rossetti, MD;  Location: [facility];  Service: Orthopedics;  Laterality: Right;  . TRANSESOPHAGEAL ECHOCARDIOGRAM  09/22/2016   EF 40-45%; diffuse hypokinesis, Grd I DD.  [name] TRANSTHORACIC ECHOCARDIOGRAM  10/30/14   Mod LVH, EF 60-65%, normal wall motion, mod mitral regurg, mild PAH  . TRANSTHORACIC ECHOCARDIOGRAM  06/2016   06/2016: EF 25-30%, global hypokinesis, grd II DD, vent septum motion changes c/w LBBB, mod MV regurg, L atr mod/sev dilat, R atr mod dilat, mod incr pulm press, Grd II DD.        Home Medications    Prior to Admission medications   Medication Sig Start Date End Date Taking? Authorizing Provider  acetaminophen (TYLENOL) 325 MG tablet  Take 2 tablets (650 mg total) by mouth every 4 (four) hours as needed for headache or mild pain. 08/09/13  Yes Kilroy, Doreene Burke, PA-C  Ascorbic Acid (VITAMIN C) 1000 MG tablet Take 1,000 mg by mouth daily.   Yes [provider]  atorvastatin (LIPITOR) 40 MG tablet Take 40 mg by mouth daily at 6 PM.    Yes [provider]  bisoprolol (ZEBETA) 5 MG tablet Take 1.5 tablets (7.5 mg total) by mouth daily. 02/23/17  Yes Larey Dresser, MD  Cholecalciferol (VITAMIN D-3) 1000 units CAPS Take 1,000 Units by mouth daily. Reported on 02/26/2015   Yes [provider]  [place] 125 MCG tablet [place] DAY Patient taking differently: Take 0.125 mg by mouth daily.  01/28/17  Yes Larey Dresser, MD  fluticasone [facility]) 50 MCG/ACT nasal spray Place 2 sprays into both nostrils daily. Patient taking differently: Place 2 sprays into both nostrils as needed.  06/04/16  Yes McGowen, Adrian Blackwater, MD  gabapentin (NEURONTIN) 100 MG capsule TAKE TWO CAPSULES BY MOUTH THREE TIMES DAILY Patient taking differently: Take 200 mg by mouth 2 (two) times daily.  08/09/17  Yes McGowen, Adrian Blackwater, MD  guaiFENesin (MUCINEX) 600 MG 12 hr tablet Take 2 tablets (1,200 mg total) by mouth 2 (two) times daily. 11/02/14  Yes Elmahi, Rae Lips, MD  hyoscyamine (LEVSIN SL) 0.125 MG SL tablet TAKE 1 TO 2 TABLETS BY MOUTH EVERY 6 HOURS AS NEEDED FOR DIARRHEA Patient taking differently: Take 0.125-0.25 mg by mouth as needed for cramping. AS NEEDED FOR DIARRHEA 06/03/17  Yes McGowen, Adrian Blackwater, MD  Icosapent Ethyl (VASCEPA) 1 g CAPS 2 capsules twice a day Patient taking differently: Take 2 g by mouth 2 (two) times daily.  08/10/17  Yes Elayne Snare, MD  Insulin Disposable Pump (V-GO 20) KIT Apply one Vgo 20 pump daily. 04/06/17  Yes Elayne Snare, MD  insulin regular human CONCENTRATED (HUMULIN R) 500 UNIT/ML injection INJECT 56 UNITS SUBCUTANEOUSLY DAILY IN INSULIN PUMP 09/22/17  Yes Elayne Snare, MD  isosorbide mononitrate  (IMDUR) 30 MG 24 hr tablet TAKE ONE TABLET BY MOUTH EVERY DAY Patient taking differently: Take 30 mg by mouth daily.  06/09/17  Yes McGowen, Adrian Blackwater, MD  lipase/protease/amylase (CREON) 36000 UNITS CPEP capsule Take 2 capsules (72,000 Units total) by mouth 3 (three) times daily with meals. Patient taking differently: Take 72,000 Units by mouth daily with breakfast.  09/02/17  Yes Danis, Estill Cotta III, MD  lisinopril (PRINIVIL,ZESTRIL) 10 MG tablet Take 1 tablet (10 mg total) by mouth daily. 08/23/17  Yes Larey Dresser, MD  mirtazapine (REMERON) 45 MG tablet Take 1 tablet (45 mg total) by mouth at bedtime. 02/09/17  Yes McGowen, Adrian Blackwater, MD  Multiple Vitamin (MULTIVITAMIN WITH MINERALS) TABS Take 1 tablet by mouth daily.   Yes [provider]  nitroGLYCERIN (NITROSTAT) 0.4 MG SL tablet PLACE 1 TABLET UNDER THE TONGUE EVERY 5 MINUTES AS NEEDED FOR CHEST PAIN Patient taking differently: Place 0.4 mg under the tongue every 5 (five) minutes as needed for chest pain.  03/30/17  Yes Larey Dresser, MD  PROAIR HFA 108 [phone] Base) MCG/ACT inhaler INHALE 2 PUFFS INTO THE LUNGS EVERY 4 HOURS AS NEEDED FOR WHEEZING ORSHORTNESS OF BREATH Patient taking differently: Inhale 2 puffs into the lungs every 4 (four) hours as needed for wheezing or shortness of breath.  06/05/16  Yes McGowen, Adrian Blackwater, MD  rivaroxaban (XARELTO) 20 MG TABS tablet Take 1 tablet (20 mg total) by mouth daily with supper. 08/09/17  Yes Larey Dresser, MD  sertraline (ZOLOFT) 25 MG tablet TAKE TWO TABLETS BY MOUTH EVERY DAY Patient taking differently: Take 50 mg by mouth at bedtime.  05/25/17  Yes McGowen, Adrian Blackwater, MD  spironolactone (ALDACTONE) 25 MG tablet TAKE ONE TABLET BY MOUTH EVERY DAY Patient taking differently: Take 25 mg by mouth daily.  01/22/17  Yes Bensimhon, Shaune Pascal, MD  SYMBICORT 160-4.5 MCG/ACT inhaler INHALE 2 PUFFS INTO THE LUNGS TWICE DAILY Patient taking differently: Inhale 2 puffs into the lungs 2 (two) times daily.   12/01/16  Yes McGowen, Adrian Blackwater, MD  SYNJARDY 05-998 MG TABS TAKE ONE TABLET BY MOUTH TWICE DAILY AFTER A MEAL Patient taking differently: Take 1 tablet by mouth  2 (two) times daily.  09/14/17  Yes Elayne Snare, MD  tamsulosin (FLOMAX) 0.4 MG CAPS capsule TAKE 1 CAPSULE BY MOUTH EVERY DAY AFTER SUPPER Patient taking differently: Take 0.4 mg by mouth daily after supper.  07/16/17  Yes McGowen, Adrian Blackwater, MD  torsemide (DEMADEX) 20 MG tablet TAKE TWO TABLETS BY MOUTH EVERY DAY Patient taking differently: 40 mg daily.  03/04/17  Yes Larey Dresser, MD  ACCU-CHEK GUIDE test strip USE TO CHECK BLOOD SUGAR 3 TIMES DAILY 07/27/16   McGowen, Adrian Blackwater, MD    Family History Family History  Problem Relation Age of Onset  . Heart disease Father   . Heart attack Father   . CVA Mother   . Hypertension Mother   . Diabetes Paternal Grandmother   . Breast cancer Sister   . Diabetes Maternal Uncle   . Heart disease Maternal Uncle   . Stroke Neg Hx     Social History Social History   Tobacco Use  . Smoking status: Current Every Day Smoker    Packs/day: 1.00    Years: 64.00    Pack years: 64.00    Types: Cigarettes    Last attempt to quit: 10/23/2015    Years since quitting: 1.9  . Smokeless tobacco: Former Systems developer    Quit date: 06/30/2013  Substance Use Topics  . Alcohol use: No    Alcohol/week: 0.0 standard drinks  . Drug use: No     Allergies   Morphine and related; Entresto [sacubitril-valsartan]; Demerol [meperidine]; and Starlix [nateglinide]   Review of Systems Review of Systems  Constitutional: Positive for appetite change and fatigue. Negative for fever.  HENT: Negative for sore throat.   Eyes: Negative for visual disturbance.  Respiratory: Negative for shortness of breath.   Cardiovascular: Negative for chest pain.  Gastrointestinal: Positive for diarrhea. Negative for abdominal pain.  Genitourinary: Negative for difficulty urinating.  Musculoskeletal: Negative for back pain  and neck stiffness.  Skin: Negative for rash.  Neurological: Negative for syncope and headaches.     Physical Exam Updated Vital Signs BP (!) 135/93   Pulse (!) 59   Temp 98.1 F (36.7 C) (Oral)   Resp 19   Ht 5' 11" (1.803 m)   Wt 101.8 kg   SpO2 99%   BMI 31.31 kg/m   Physical Exam  Constitutional: He is oriented to person, place, and time. He appears well-developed and well-nourished. No distress.  HENT:  Head: Normocephalic and atraumatic.  Eyes: Conjunctivae and EOM are normal.  Neck: Normal range of motion.  Cardiovascular: Regular rhythm, normal heart sounds and intact distal pulses. Bradycardia present. Exam reveals no gallop and no friction rub.  No murmur heard. Pulmonary/Chest: Effort normal and breath sounds normal. No respiratory distress. He has no wheezes. He has no rales.  Abdominal: Soft. He exhibits no distension. There is no tenderness. There is no guarding.  Musculoskeletal: He exhibits no edema.  Neurological: He is alert and oriented to person, place, and time.  Skin: Skin is warm and dry. He is not diaphoretic.  Nursing note and vitals reviewed.    ED Treatments / Results  Labs (all labs ordered are listed, but only abnormal results are displayed) Labs Reviewed  CBC WITH DIFFERENTIAL/PLATELET - Abnormal; Notable for the following components:      Result Value   RDW 16.9 (*)    All other components within normal limits  COMPREHENSIVE METABOLIC PANEL - Abnormal; Notable for the following components:   Potassium 5.8 (*)  CO2 15 (*)    Glucose, Bld 117 (*)    BUN 96 (*)    Creatinine, Ser 1.98 (*)    GFR calc non Af Amer 31 (*)    GFR calc Af Amer 35 (*)    All other components within normal limits  BRAIN NATRIURETIC PEPTIDE - Abnormal; Notable for the following components:   B Natriuretic Peptide 166.4 (*)    All other components within normal limits  GLUCOSE, CAPILLARY - Abnormal; Notable for the following components:   Glucose-Capillary  163 (*)    All other components within normal limits  TROPONIN I - Abnormal; Notable for the following components:   Troponin I 0.20 (*)    All other components within normal limits  LACTIC ACID, PLASMA - Abnormal; Notable for the following components:   Lactic Acid, Venous 2.6 (*)    All other components within normal limits  I-STAT CHEM 8, ED - Abnormal; Notable for the following components:   Potassium 5.6 (*)    BUN 114 (*)    Creatinine, Ser 2.10 (*)    Glucose, Bld 114 (*)    Calcium, Ion 1.13 (*)    TCO2 17 (*)    All other components within normal limits  I-STAT TROPONIN, ED - Abnormal; Notable for the following components:   Troponin i, poc 0.11 (*)    All other components within normal limits  I-STAT CG4 LACTIC ACID, ED - Abnormal; Notable for the following components:   Lactic Acid, Venous 2.65 (*)    All other components within normal limits  I-STAT CG4 LACTIC ACID, ED - Abnormal; Notable for the following components:   Lactic Acid, Venous 2.29 (*)    All other components within normal limits  MRSA PCR SCREENING  URINE CULTURE  URINALYSIS, ROUTINE W REFLEX MICROSCOPIC  DIGOXIN LEVEL  DIGOXIN LEVEL  PROCALCITONIN  COOXEMETRY PANEL  COOXEMETRY PANEL  BASIC METABOLIC PANEL  CBC  TROPONIN I  TROPONIN I  LACTIC ACID, PLASMA    EKG EKG Interpretation  Date/Time:  Thursday September 30 2017 12:01:04 EDT Ventricular Rate:  39 PR Interval:  298 QRS Duration: 172 QT Interval:  466 QTC Calculation: 375 R Axis:   -35 Text Interpretation:  Marked sinus bradycardia with 1st degree A-V block Left axis deviation Left bundle branch block Abnormal ECG Since prior ECG, rate has slowed, QRS widened.  QRS similar on prior ECGs   Confirmed by Gareth Morgan [phone]) on 09/30/2017 12:09:16 PM   Radiology Dg Chest Portable 1 View  Result Date: 09/30/2017 CLINICAL DATA:  Weakness and shortness of breath with exertion for the past 2-3 weeks. EXAM: PORTABLE CHEST 1 VIEW  COMPARISON:  08/27/2016. Chest CT dated 06/02/2016. FINDINGS: Normal sized heart. Stable post CABG changes. Previously demonstrated calcified pleural plaques. Stable mildly prominent interstitial markings. Thoracic spine degenerative changes and mild bilateral shoulder degenerative changes. IMPRESSION: 1. No acute abnormality. 2. Stable mild chronic interstitial lung disease. 3. Previously demonstrated bilateral calcified pleural plaques compatible with previous asbestos exposure. Electronically Signed   By: Claudie Revering M.D.   On: 09/30/2017 13:28   [place] Ekg Site Rite  Result Date: 09/30/2017 If Site Rite image not attached, placement could not be confirmed due to current cardiac rhythm.   Procedures .Critical Care Performed by: Gareth Morgan, MD Authorized by: Gareth Morgan, MD   Critical care provider statement:    Critical care time (minutes):  30   Critical care was time spent personally by me on the following  activities:  Examination of patient, evaluation of patient's response to treatment, discussions with consultants, ordering and review of laboratory studies, ordering and review of radiographic studies, pulse oximetry and re-evaluation of patient's condition   (including critical care time)  Medications Ordered in ED Medications  atorvastatin (LIPITOR) tablet 40 mg (40 mg Oral Given 09/30/17 1937)  omega-3 acid ethyl esters (LOVAZA) capsule 2 g (has no administration in time range)  nitroGLYCERIN (NITROSTAT) SL tablet 0.4 mg (has no administration in time range)  mirtazapine (REMERON) tablet 45 mg (has no administration in time range)  sertraline (ZOLOFT) tablet 50 mg (has no administration in time range)  hyoscyamine (LEVSIN SL) SL tablet 0.125-0.25 mg (has no administration in time range)  tamsulosin (FLOMAX) capsule 0.4 mg (0.4 mg Oral Given 09/30/17 1937)  rivaroxaban (XARELTO) tablet 20 mg ( Oral Given 09/30/17 1936)  gabapentin (NEURONTIN) capsule 200 mg (has no  administration in time range)  guaiFENesin (MUCINEX) 12 hr tablet 1,200 mg (has no administration in time range)  albuterol (PROVENTIL) (2.5 MG/3ML) 0.083% nebulizer solution 2.5 mg (has no administration in time range)  mometasone-formoterol (DULERA) 200-5 MCG/ACT inhaler 2 puff (2 puffs Inhalation Not Given 09/30/17 1924)  acetaminophen (TYLENOL) tablet 650 mg (has no administration in time range)    Or  acetaminophen (TYLENOL) suppository 650 mg (has no administration in time range)  docusate sodium (COLACE) capsule 100 mg (has no administration in time range)  ondansetron (ZOFRAN) tablet 4 mg (has no administration in time range)    Or  ondansetron (ZOFRAN) injection 4 mg (has no administration in time range)  sodium chloride flush (NS) 0.9 % injection 3 mL (has no administration in time range)  lactated ringers infusion (has no administration in time range)  insulin aspart (novoLOG) injection 0-15 Units (has no administration in time range)  insulin aspart (novoLOG) injection 0-5 Units (has no administration in time range)  sodium chloride flush (NS) 0.9 % injection 10-40 mL (has no administration in time range)  calcium gluconate 1 g in sodium chloride 0.9 % 100 mL IVPB ( Intravenous Stopped 09/30/17 1424)  insulin aspart (novoLOG) injection 5 Units (5 Units Intravenous Given 09/30/17 1402)  dextrose 50 % solution 50 mL (50 mLs Intravenous Given 09/30/17 1404)  sodium chloride 0.9 % bolus 500 mL (0 mLs Intravenous Stopped 09/30/17 1631)     Initial Impression / Assessment and Plan / ED Course  I have reviewed the triage vital signs and the nursing notes.  Pertinent labs & imaging results that were available during my care of the patient were reviewed by me and considered in my medical decision making (see chart for details).     77 year old male with a history of coronary artery disease, COPD, diabetes, hypertension, ischemic cardiomyopathy, atrial fibrillation, peripheral artery  disease, pericardial effusion, presents with concern for generalized weakness for 3 weeks.  Patient initially with heart rate 39 on arrival to the emergency department, blood pressures 82X systolic.  Labs significant for potassium of 5.6, and acute kidney injury with a creatinine of 2.  On my exam, patient appears euvolemic to slightly dry.  Possible that renal injury is secondary to episodes of diarrhea, and dehydration.  Suspect that with his mild AKI, he has had an increase in his beta-blocker and digoxin levels, contributing to worsening bradycardia and hypotension.  Do not suspect lactic acidosis is secondary to sepsis given no infectious symptoms or source, and suspect it is most likely due to hypoperfusion from bradycardia.  Pt has hx  of pericardial effusion/tamponade in past, effusion on differential, although XR shows normal size heart, ECG voltage similar to prior.  Chronic dig toxicity on differential, but likely multifactorial etiology of symptoms-will send dig level 6 hr after last dose/  Blood pressures and heart rate improved following administration of fluid. Will admit to hospitalist for further care with Cardiology consultation.   Final Clinical Impressions(s) / ED Diagnoses   Final diagnoses:  Generalized weakness  Bradycardia  Hyperkalemia  AKI (acute kidney injury) [facility])    ED Discharge Orders    None       Gareth Morgan, MD 09/30/17 2301

## 2017-09-30 NOTE — Progress Notes (Signed)
Pt does not have insulin pump on. MD made aware

## 2017-09-30 NOTE — ED Triage Notes (Signed)
Patient presents with wife and daughter in law c/o weakness and shortness of breath with exertion x2-3 weeks. Wife states pt has been sleeping most of the day. Also reports blood sugars ranging from 40s-200s. Denies any chest pain or fevers. Pt alert and orientated x4.

## 2017-09-30 NOTE — Progress Notes (Signed)
Peripherally Inserted Central Catheter/Midline Placement  The IV Nurse has discussed with the patient and/or persons authorized to consent for the patient, the purpose of this procedure and the potential benefits and risks involved with this procedure.  The benefits include less needle sticks, lab draws from the catheter, and the patient may be discharged home with the catheter. Risks include, but not limited to, infection, bleeding, blood clot (thrombus formation), and puncture of an artery; nerve damage and irregular heartbeat and possibility to perform a PICC exchange if needed/ordered by physician.  Alternatives to this procedure were also discussed.  Bard Power PICC patient education guide, fact sheet on infection prevention and patient information card has been provided to patient /or left at bedside.    PICC/Midline Placement Documentation  PICC Double Lumen 68/03/21 PICC Right Basilic 44 cm 0 cm (Active)  Indication for Insertion or Continuance of Line Prolonged intravenous therapies 09/30/2017 10:25 PM  Exposed Catheter (cm) 0 cm 09/30/2017 10:25 PM  Site Assessment Clean;Dry;Intact 09/30/2017 10:25 PM  Lumen #1 Status Blood return noted;Flushed;Saline locked 09/30/2017 10:25 PM  Lumen #2 Status Blood return noted;Flushed;Saline locked 09/30/2017 10:25 PM  Dressing Type Transparent;Securing device 09/30/2017 10:25 PM  Dressing Status Clean;Dry;Intact;Antimicrobial disc in place 09/30/2017 10:25 PM  Line Adjustment (NICU/IV Team Only) No 09/30/2017 10:25 PM  Dressing Intervention New dressing 09/30/2017 10:25 PM  Dressing Change Due 10/07/17 09/30/2017 10:25 PM       Edson Snowball 09/30/2017, 10:43 PM

## 2017-09-30 NOTE — H&P (Signed)
History and Physical    Joshua [name]:[id number] DOB: 1940/06/18 DOA: 09/30/2017  PCP: Tammi Sou, MD Consultants:  [name] - cardiology; Dwyane Dee - endocrinology; [facility] - pulmonology; [facility] - GI Patient coming from:  Home - lives with wife and wife's sister; NOK: wife, [phone]  Chief Complaint: Weakness  HPI: Joshua [name] is a 77 y.o. male with medical history significant of PAF on Xarelto; PAD; HTN; HLD: DM; COPD; CKD stage 3; CAD; and chronic diastolic heart failure presenting with weakness.  Patient reports that he has "no energy."  He reports that this has been going on for maybe 2-3 weeks.  His appetite has fallen, hasn't been taking in many fluids the last 4 days.  Recent addition of Creon about 3 weeks ago.  Headache the last few days, but usually does not.  +SOB with ambulation.  +diarrhea and constipation, alternating for months, somewhat better with Creon.  They called Dr. [name] this AM and he sent them to the ER.  They spoke with Dr. Anitra Lauth last night and were also told this.   ED Course:  Weakness, AKI, mild hyperkalemia, bradycardia, troponin elevation.  3 weeks of feeling poorly.  HR 30-50, likely symptomatic bradycardia related to elevated Dig level, beta blocker, and AKI.  Poison control contacted, recommended 6 hour Dig level - but likely more multifactorial than  Dog toxicity.  Increased troponin is thought to be related to strain rather than ACS, but cardiology is consulted.  Lactate from general hypoperfusion rather than sepsis.  He is getting a 500 cc bolus.  He was given potassium, insulin, and dextrose.  BP starting to improve, maybe mild dehydration.  Review of Systems: As per HPI; otherwise review of systems reviewed and negative.   Ambulatory Status:  Ambulates with a walker in the house or a cane outside  Past Medical History:  Diagnosis Date  . Acute respiratory distress 06/04/2016  . Adenomatous colon polyp 10/16/2011   Repeat 2018  . Arthritis       Hips, R>L & KNEES  . CAD, multiple vessel    3V CAD cath 08/08/13----CABG done shortly after.  Cath 06/2016: clear bipasses/no interventional lesion.  . 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 pylori neg, no dysplasia or malignancy.  . Chronic combined systolic and diastolic CHF, NYHA class 2 ([place]) 2016-2018   Followed by [facility] clinic.  Transesoph Echo 09/2016 EF improved to 40-45%; diffuse hypokinesis, Grd I DD.  [name] Chronic renal insufficiency, stage 3 (moderate) (HCC) 2018   GFR 50s  . COPD (chronic obstructive pulmonary disease) ([place])    GOLD II.  Spirometry  2004 borderline obstruction; 2015 mod obst: noncompliant with bid symbicort so pulmonologist switched him to a once daily inhaler: Trelegy ellipta 12/2015.  Oxygen prn: goal 88-92%.  . Diabetic nephropathy ([place])    Elevated urine microalb/cr 03/2011  . DM type 2 (diabetes mellitus, type 2) ([place])    Poor control on max oral meds--pt eventually agreed to insulin therapy.  As of 2017 his DM is being managed by Dr. Dwyane Dee in endo.  On insulin pump as of 04/2017 endo f/u.  [name] DOE (dyspnea on exertion)    COPD + chronic diastolic HF  . Dysthymia    Worsened after 2015 heart surgery.  Memory loss +? as well.  Neuro eval 01/2017--plan is to get MRI brain, B12 level, and neuropsychiatric testing.  . Erectile dysfunction    Normal testosterone  . Hearing loss of  both ears 2016   Hearing aids  . Hyperplastic colon polyp 2001   No further colonoscopies to be done as of 11/2016 GI eval.  . Hypertension   . IBS (irritable bowel syndrome)    responds relatively well to prn levsin.  Pepto bismol q AM trial by Dr. Loletha Carrow 08/2017, also checking fecal elastace and lactoferrin.  . Iron deficiency anemia 2014   03/2012 capsule endoscopy showed 2 AVMs--likely responsible for his IDA--lifetime iron supp recommended + q22moCBCs.  . Ischemic cardiomyopathy 06/2016; 09/2016   06/2016: EF 25-30%, global hypokinesis, grd II  DD, vent septum motion changes c/w LBBB, mod MV regurg, L atr mod/sev dilat, R atr mod dilat, mod incr pulm press, Grd II DD.  09/2016--EF 40-45%; diffuse hypokinesis, Grd I DD.  . Macular degeneration, dry    Mild, bilat (Optometrist, DMayford Knifeat [place]  . Mild cognitive impairment    MCI, likley vascular etiology, + MDD, recurrent-->neuropsych testing 06/2017.  Neurol to repeat testing 1 yr  . Mixed hyperlipidemia    statin and vascepa as of 04/2017  . Nocturnal hypoxemia   . Obesity   . Open toe wound 11/2015   Wound care clinic appt made and then canceled when toe improved.  . Other and unspecified angina pectoris   . PAD (peripheral artery disease) ([place] 02/2016   Abnormal ABI's and waveforms: LE arterial duplex ordered for f/u as per cardiologist's recommendation.  Vasc eval by Dr. CBridgett Larsson impression was minimal PAD, recommended maximize med mgmt.  .Marland KitchenPAF (paroxysmal atrial fibrillation) ([place] 10/2014   xarelto  . Pericardial effusion with cardiac tamponade 6//29/15   pericardiocentesis was done,  Infectious/inflamm (cytology showed NO MALIGNANT CELLS)  . Pleural plaque    Pleural plaques/asbestosis changes on CT chest done by pulm 05/2016.  .Marland KitchenPneumonia   . Tobacco dependence in remission    100+ pack-yr hx: quit after CABG    Past Surgical History:  Procedure Laterality Date  . APPENDECTOMY  1957  . CARDIAC CATHETERIZATION  08/08/2013  . CATARACT EXTRACTION W/ INTRAOCULAR LENS  IMPLANT, BILATERAL  04/08/2006 & 04/22/2006  . CATARACT EXTRACTION W/ INTRAOCULAR LENS  IMPLANT, BILATERAL Bilateral   . CHOLECYSTECTOMY OPEN  1999  . COLONOSCOPY  10/16/2011   Procedure: COLONOSCOPY;  Surgeon: RInda Castle MD;  [facility]: [facility];  Service: Endoscopy;  Laterality: N/A;  . CORONARY ARTERY BYPASS GRAFT N/A 08/14/2013   Procedure: CORONARY ARTERY BYPASS GRAFTING (CABG);  Surgeon: SMelrose Nakayama MD;  [place]  Service: Open Heart Surgery;   Laterality: N/A;  Times 4   using left internal mammary artery and endoscopically harvested bilateral saphenous vein  . ESOPHAGOGASTRODUODENOSCOPY  02/25/12   Atrophic gastritis with a few erosions--capsule endoscopy planned as of 02/25/12 (Dr. KDeatra Ina.  .Marland KitchenHARDWARE REMOVAL Right 08/12/2012   Procedure: HARDWARE REMOVAL;  Surgeon: CMcarthur Rossetti MD;  [facility]: [facility];  Service: Orthopedics;  Laterality: Right;  . HIP SURGERY Right 1954   Repair of slipped capital femoral epiphysis.  . INTRAOPERATIVE TRANSESOPHAGEAL ECHOCARDIOGRAM N/A 08/14/2013   Normal LV function. Procedure: INTRAOPERATIVE TRANSESOPHAGEAL ECHOCARDIOGRAM;  Surgeon: SMelrose Nakayama MD;  [place]  Service: Open Heart Surgery;  Laterality: N/A;  . LEFT HEART CATHETERIZATION WITH CORONARY ANGIOGRAM N/A 08/08/2013   Procedure: LEFT HEART CATHETERIZATION WITH CORONARY ANGIOGRAM;  Surgeon: JJettie Booze MD;  [facility];  Service: Cardiovascular;  Laterality: N/A;  . PATELLA FRACTURE SURGERY Left ~  1979   bolt + 3 screws to repair tib plateau fx  . PERICARDIAL TAP N/A 07/01/2013   Procedure: PERICARDIAL TAP;  Surgeon: Jettie Booze, MD;  Location: [facility] CATH LAB;  Service: Cardiovascular;  Laterality: N/A;  . RIGHT/LEFT HEART CATH AND CORONARY ANGIOGRAPHY N/A 06/10/2016   Procedure: Right/Left Heart Cath and Coronary Angiography;  Surgeon: Larey Dresser, MD;  Location: [place] CV LAB;  Service: Cardiovascular;  Laterality: N/A;  . TESTICLE SURGERY  as a child   Undescended testicle brought down into scrotum  . TONSILLECTOMY  1947  . TOTAL HIP ARTHROPLASTY Right 08/12/2012   Procedure: REMOVAL OF OLD PINS RIGHT HIP AND RIGHT TOTAL HIP ARTHROPLASTY ANTERIOR APPROACH;  Surgeon: Mcarthur Rossetti, MD;  [facility];  Service: Orthopedics;  Laterality: Right;  . TRANSESOPHAGEAL ECHOCARDIOGRAM  09/22/2016   EF 40-45%; diffuse hypokinesis, Grd I DD.  [name] TRANSTHORACIC ECHOCARDIOGRAM  10/30/14    Mod LVH, EF 60-65%, normal wall motion, mod mitral regurg, mild PAH  . TRANSTHORACIC ECHOCARDIOGRAM  06/2016   06/2016: EF 25-30%, global hypokinesis, grd II DD, vent septum motion changes c/w LBBB, mod MV regurg, L atr mod/sev dilat, R atr mod dilat, mod incr pulm press, Grd II DD.    Social History   Socioeconomic History  . Marital status: Married    Spouse name: [name]  . Number of children: 4  . Years of education: Not on file  . Highest education level: Not on file  Occupational History  . Occupation: retired, former Scientist, forensic  Social Needs  . Financial resource strain: Not on file  . Food insecurity:    Worry: Not on file    Inability: Not on file  . Transportation needs:    Medical: Not on file    Non-medical: Not on file  Tobacco Use  . Smoking status: Current Every Day Smoker    Packs/day: 1.00    Years: 64.00    Pack years: 64.00    Types: Cigarettes    Last attempt to quit: 10/23/2015    Years since quitting: 1.9  . Smokeless tobacco: Former Systems developer    Quit date: 06/30/2013  Substance and Sexual Activity  . Alcohol use: No    Alcohol/week: 0.0 standard drinks  . Drug use: No  . Sexual activity: Never  Lifestyle  . Physical activity:    Days per week: Not on file    Minutes per session: Not on file  . Stress: Not on file  Relationships  . Social connections:    Talks on phone: Not on file    Gets together: Not on file    Attends religious service: Not on file    Active member of club or organization: Not on file    Attends meetings of clubs or organizations: Not on file    Relationship status: Not on file  . Intimate partner violence:    Fear of current or ex partner: Not on file    Emotionally abused: Not on file    Physically abused: Not on file    Forced sexual activity: Not on file  Other Topics Concern  . Not on file  Social History Narrative   Married, 4 children.   Formerly a Medical illustrator.   Level of education: HS.      +tobacco--lifelong/ quit 2015).  No alcohol or drugs.   No exercise.  +excessive caffeine.      Lives in 1 story home with his wife and her  sister    Allergies  Allergen Reactions  . Morphine And Related Other (See Comments)    Drenched with perspiration  . Entresto [Sacubitril-Valsartan]     hypotension  . Demerol [Meperidine] Nausea Only  . Starlix [Nateglinide] Other (See Comments)    gassy    Family History  Problem Relation Age of Onset  . Heart disease Father   . Heart attack Father   . CVA Mother   . Hypertension Mother   . Diabetes Paternal Grandmother   . Breast cancer Sister   . Diabetes Maternal Uncle   . Heart disease Maternal Uncle   . Stroke Neg Hx     Prior to Admission medications   Medication Sig Start Date End Date Taking? Authorizing Provider  acetaminophen (TYLENOL) 325 MG tablet Take 2 tablets (650 mg total) by mouth every 4 (four) hours as needed for headache or mild pain. 08/09/13  Yes Kilroy, Doreene Burke, PA-C  Ascorbic Acid (VITAMIN C) 1000 MG tablet Take 1,000 mg by mouth daily.   Yes [provider]  atorvastatin (LIPITOR) 40 MG tablet Take 40 mg by mouth daily at 6 PM.    Yes [provider]  bisoprolol (ZEBETA) 5 MG tablet Take 1.5 tablets (7.5 mg total) by mouth daily. 02/23/17  Yes Larey Dresser, MD  Cholecalciferol (VITAMIN D-3) 1000 units CAPS Take 1,000 Units by mouth daily. Reported on 02/26/2015   Yes [provider]  [place] 125 MCG tablet [place] DAY Patient taking differently: Take 0.125 mg by mouth daily.  01/28/17  Yes Larey Dresser, MD  fluticasone [facility]) 50 MCG/ACT nasal spray Place 2 sprays into both nostrils daily. Patient taking differently: Place 2 sprays into both nostrils as needed.  06/04/16  Yes McGowen, Adrian Blackwater, MD  gabapentin (NEURONTIN) 100 MG capsule TAKE TWO CAPSULES BY MOUTH THREE TIMES DAILY Patient taking differently: Take 200 mg by mouth 2 (two) times daily.  08/09/17   Yes McGowen, Adrian Blackwater, MD  guaiFENesin (MUCINEX) 600 MG 12 hr tablet Take 2 tablets (1,200 mg total) by mouth 2 (two) times daily. 11/02/14  Yes Elmahi, Rae Lips, MD  hyoscyamine (LEVSIN SL) 0.125 MG SL tablet TAKE 1 TO 2 TABLETS BY MOUTH EVERY 6 HOURS AS NEEDED FOR DIARRHEA Patient taking differently: Take 0.125-0.25 mg by mouth as needed for cramping. AS NEEDED FOR DIARRHEA 06/03/17  Yes McGowen, Adrian Blackwater, MD  Icosapent Ethyl (VASCEPA) 1 g CAPS 2 capsules twice a day Patient taking differently: Take 2 g by mouth 2 (two) times daily.  08/10/17  Yes Elayne Snare, MD  Insulin Disposable Pump (V-GO 20) KIT Apply one Vgo 20 pump daily. 04/06/17  Yes Elayne Snare, MD  insulin regular human CONCENTRATED (HUMULIN R) 500 UNIT/ML injection INJECT 56 UNITS SUBCUTANEOUSLY DAILY IN INSULIN PUMP 09/22/17  Yes Elayne Snare, MD  isosorbide mononitrate (IMDUR) 30 MG 24 hr tablet TAKE ONE TABLET BY MOUTH EVERY DAY Patient taking differently: Take 30 mg by mouth daily.  06/09/17  Yes McGowen, Adrian Blackwater, MD  lipase/protease/amylase (CREON) 36000 UNITS CPEP capsule Take 2 capsules (72,000 Units total) by mouth 3 (three) times daily with meals. Patient taking differently: Take 72,000 Units by mouth daily with breakfast.  09/02/17  Yes Danis, Estill Cotta III, MD  lisinopril (PRINIVIL,ZESTRIL) 10 MG tablet Take 1 tablet (10 mg total) by mouth daily. 08/23/17  Yes Larey Dresser, MD  mirtazapine (REMERON) 45 MG tablet Take 1 tablet (45 mg total) by mouth  at bedtime. 02/09/17  Yes McGowen, Adrian Blackwater, MD  Multiple Vitamin (MULTIVITAMIN WITH MINERALS) TABS Take 1 tablet by mouth daily.   Yes [provider]  nitroGLYCERIN (NITROSTAT) 0.4 MG SL tablet PLACE 1 TABLET UNDER THE TONGUE EVERY 5 MINUTES AS NEEDED FOR CHEST PAIN Patient taking differently: Place 0.4 mg under the tongue every 5 (five) minutes as needed for chest pain.  03/30/17  Yes Larey Dresser, MD  PROAIR HFA 108 [phone] Base) MCG/ACT inhaler INHALE 2 PUFFS INTO THE LUNGS EVERY  4 HOURS AS NEEDED FOR WHEEZING ORSHORTNESS OF BREATH Patient taking differently: Inhale 2 puffs into the lungs every 4 (four) hours as needed for wheezing or shortness of breath.  06/05/16  Yes McGowen, Adrian Blackwater, MD  rivaroxaban (XARELTO) 20 MG TABS tablet Take 1 tablet (20 mg total) by mouth daily with supper. 08/09/17  Yes Larey Dresser, MD  sertraline (ZOLOFT) 25 MG tablet TAKE TWO TABLETS BY MOUTH EVERY DAY Patient taking differently: Take 50 mg by mouth at bedtime.  05/25/17  Yes McGowen, Adrian Blackwater, MD  spironolactone (ALDACTONE) 25 MG tablet TAKE ONE TABLET BY MOUTH EVERY DAY Patient taking differently: Take 25 mg by mouth daily.  01/22/17  Yes Bensimhon, Shaune Pascal, MD  SYMBICORT 160-4.5 MCG/ACT inhaler INHALE 2 PUFFS INTO THE LUNGS TWICE DAILY Patient taking differently: Inhale 2 puffs into the lungs 2 (two) times daily.  12/01/16  Yes McGowen, Adrian Blackwater, MD  SYNJARDY 05-998 MG TABS TAKE ONE TABLET BY MOUTH TWICE DAILY AFTER A MEAL Patient taking differently: Take 1 tablet by mouth 2 (two) times daily.  09/14/17  Yes Elayne Snare, MD  tamsulosin (FLOMAX) 0.4 MG CAPS capsule TAKE 1 CAPSULE BY MOUTH EVERY DAY AFTER SUPPER Patient taking differently: Take 0.4 mg by mouth daily after supper.  07/16/17  Yes McGowen, Adrian Blackwater, MD  torsemide (DEMADEX) 20 MG tablet TAKE TWO TABLETS BY MOUTH EVERY DAY Patient taking differently: 40 mg daily.  03/04/17  Yes Larey Dresser, MD  ACCU-CHEK GUIDE test strip USE TO CHECK BLOOD SUGAR 3 TIMES DAILY 07/27/16   Tammi Sou, MD    Physical Exam: Vitals:   09/30/17 1600 09/30/17 1630 09/30/17 1706 09/30/17 1710  BP: (!) 113/57 (!) 134/49  (!) 135/93  Pulse: (!) 56 (!) 58  (!) 59  Resp: _0 Temp:    98.1 F (36.7 C)  TempSrc:    Oral  SpO2: 97% 98%  99%  Weight:   101.8 kg   Height:   _1  (1.803 m)      General:  Appears calm and comfortable and is NAD Eyes:  PERRL, EOMI, normal lids, iris; chronically injected conjunctivae ENT:  Hard of  hearing, normal lips & tongue, mmm; artificial dentition Neck:  no LAD, masses or thyromegaly; no carotid bruits Cardiovascular:  Bradycardia, no m/r/g. No LE edema.  Respiratory:   CTA bilaterally with no wheezes/rales/rhonchi.  Normal respiratory effort. Abdomen:  soft, NT, ND, NABS Skin:  no rash or induration seen on limited exam Musculoskeletal:  grossly normal tone BUE/BLE, good ROM, no bony abnormality Lower extremity:  No LE edema.  Limited foot exam with no ulcerations.  2+ distal pulses. Psychiatric:  grossly normal mood and affect, speech fluent and appropriate, AOx3 Neurologic:  CN 2-12 grossly intact, moves all extremities in coordinated fashion, sensation intact    Radiological Exams on Admission: Dg Chest Portable 1 View  Result Date: 09/30/2017 CLINICAL DATA:  Weakness and  shortness of breath with exertion for the past 2-3 weeks. EXAM: PORTABLE CHEST 1 VIEW COMPARISON:  08/27/2016. Chest CT dated 06/02/2016. FINDINGS: Normal sized heart. Stable post CABG changes. Previously demonstrated calcified pleural plaques. Stable mildly prominent interstitial markings. Thoracic spine degenerative changes and mild bilateral shoulder degenerative changes. IMPRESSION: 1. No acute abnormality. 2. Stable mild chronic interstitial lung disease. 3. Previously demonstrated bilateral calcified pleural plaques compatible with previous asbestos exposure. Electronically Signed   By: Claudie Revering M.D.   On: 09/30/2017 13:28   [place] Ekg Site Rite  Result Date: 09/30/2017 If Site Rite image not attached, placement could not be confirmed due to current cardiac rhythm.   EKG: Independently reviewed.  Sinus bradycardia with rate 39   Labs on Admission: I have personally reviewed the available labs and imaging studies at the time of the admission.  Pertinent labs:   K+ 5.8 CO2 15 Glucose 115 BUN 96/Creatinine 1.98/GFR 31; 39/1.30/57 on 8/14 BNP 166.4 Troponin 0.11 Lactate 2.65 Essentially normal  CBC Dig 1.4 Normal UA A1c 8.3 on 9/25   Assessment/Plan Principal Problem:   Acute renal failure superimposed on stage 3 chronic kidney disease (HCC) Active Problems:   HTN (hypertension), benign   Tobacco dependence   Hyperlipidemia   PAF (paroxysmal atrial fibrillation) (HCC)   Diabetes mellitus with complication (HCC)   Chronic diastolic CHF (congestive heart failure) (HCC)   Medication side effect   Symptomatic bradycardia   Acute renal failure on CKD stage 3 -Patient with stage 3 CKD presenting with worsening renal function. -This may be related to hypovolemia; may be related to hypoperfusion in the setting of bradycardia; or may be related to decreased PO intake in the setting of Creon use. -Regardless, patient appears mildly dry and would likely benefit from gentle rehydration - watching for evidence of volume overload -Repeat Echo ordered by Dr. Haroldine Laws. -Hold Lisinopril, diuretics -Currently low suspicion for sepsis, suspect lactate elevated is related to dehydration  Symptomatic bradycardia -Patient is taking a beta blocker  -May have developed new bradycardia associated with bisoprolol or due to intrinsic pacemaker failure -Will hold bisoprolol and follow on telemetry -If ongoing bradycardia while off nodal blocking agents, he may need EP consult for consideration of pacer -There was some question of whether this is related to Digoxin, but he is therapeutic and this seems less likely  Medication side effect -His symptoms started within days of starting Creon -His weakness and lethargy may be related -Will hold Creon -It is unlikely that this will have marked negative effects and so discontinuation may be reasonable if patient improves off this medicaiton  HTN -Hold Lisinopril, Bisoprolol  HLD -Continue Lipitor, Vascepa  Afib -Rate controlled with bradycardia -Continue Xarelto  DM -Recent A1c 8.3, indicating suboptimal control -He is reportedly on an  insulin pump at home but according to nursing does not have insulin pump here with hm -He is receiving 56 units of U500 daily via pump - if he is actually using the pump -Will attempt to control glucose with SSI for now given this uncertainty, but he may need basal insulin -Hold Synjardy  Chronic CHF -1/61 echo with systolic CHF (EF 09-60%) -Holding diuretics -Monitor for evidence of volume overload since he is currently receiving IVF -Since Dr. Haroldine Laws is uncertain about volume status, he is placing a PICC line for co-ox testing vs. Consideration of RHC tomorrow.  Tobacco dependence -He resumed smoking. -Encourage cessation.  This was discussed with the patient and should be reviewed on an  ongoing basis.   -Patch declined by patient.  DVT prophylaxis: Xarelto Code Status:  Full - confirmed with patient/family Family Communication: Wife, daughter were present throughout evaluation  Disposition Plan:  Home once clinically improved Consults called: Cardiology  Admission status: It is my clinical opinion that referral for OBSERVATION is reasonable and necessary in this patient based on the above information provided. The aforementioned taken together are felt to place the patient at high risk for further clinical deterioration. However it is anticipated that the patient may be medically stable for discharge from the hospital within 24 to 48 hours.    Karmen Bongo MD [facility]  If note is complete, please contact covering daytime or nighttime physician. [email or web address] Password [email or web address]  09/30/2017, 6:58 PM

## 2017-09-30 NOTE — Consult Note (Addendum)
Advanced Heart Failure Team Consult Note   Primary Physician: Tammi Sou, MD PCP-Cardiologist:  No primary care provider on file.  Reason for Consultation: Generalized weakness, AKI, bradycardia in HF patient.   HPI:    KEJON FEILD is seen today for evaluation of generalized weakness, AKI, and bradycardia with concerns for digoxin toxicity at the request of Dr. Billy Fischer.   KIYON FIDALGO is a 77 y.o. male  with PMH of chronic systolic CHF, COPD, history of asbestos exposure, HTN, HLD, and DM.   Last seen in HF clinic 05/2017. Remained SOB with inclines. No meds changed. Didn't tolerate Entresto previously, and [place] in 35s so BB left alone.   Pt presented to [facility] this afternoon with weakness and SOB x 2-3 weeks. Reported blood sugars running 40s to 200s, and sleeping most of the day. Pertinent labs on admission included Cr 2.1 (up from 1.3 1 month ago), K 5.8, BUN 114, BNP 166.4, WBC 9.2, Hgb 15.6. CXR with no acute abnormality and stable mild chronic ILD.  UA negative. UCx sent. Dig level notably high at 1.4. Afebrile.   He states he has been feeling bad for several weeks. Called multiple offices to try and get an appointment and with no availability was instructed to come to ED. He denies fevers, chills, sick contacts, lightheadedness, dizziness, palpitations, peripheral edema, new medicines, or stopping old medicines. His appetite has been poor over the past 3-4 days. No dysuria, UOP OK at home. Mild SOB with getting dressed or bathing, none at rest. No orthopnea or PND. He does report a slight cough, with clear to yellow sputum in the mornings.  Echo 9/18 with EF 40-45%, septal-lateral dyssynchrony  Review of Systems: [y] = yes, _0  = no   General: Weight gain _1 ; Weight loss _2 ; Anorexia [y]; Fatigue [y]; Fever _3 ; Chills _4 ; Weakness [y]  Cardiac: Chest pain/pressure _5 ; Resting SOB _6 ; Exertional SOB [y]; Orthopnea _7 ; Pedal Edema _8 ; Palpitations _9 ; Syncope [  ]; Presyncope _10 ; Paroxysmal nocturnal dyspnea_11   Pulmonary: Cough [y]; Wheezing_12 ; Hemoptysis_13 ; Sputum [y]; Snoring _14   GI: Vomiting_15 ; Dysphagia_16 ; Melena_17 ; Hematochezia _18 ; Heartburn_19 ; Abdominal pain _20 ; Constipation _21 ; Diarrhea _22 ; BRBPR _23   GU: Hematuria_24 ; Dysuria _25 ; Nocturia_26   Vascular: Pain in legs with walking _27 ; Pain in feet with lying flat _28 ; Non-healing sores _29 ; Stroke _30 ; TIA _31 ; Slurred speech _32 ;  Neuro: Headaches_33 ; Vertigo_34 ; Seizures_35 ; Paresthesias_36 ;Blurred vision _37 ; Diplopia _38 ; Vision changes _39   Ortho/Skin: Arthritis [y]; Joint pain [y]; Muscle pain _40 ; Joint swelling _41 ; Back Pain _42 ; Rash _43   Psych: Depression_44 ; Anxiety_45   Heme: Bleeding problems _46 ; Clotting disorders _47 ; Anemia _48   Endocrine: Diabetes _49 ; Thyroid dysfunction_50   Home Medications Prior to Admission medications   Medication Sig Start Date End Date Taking? Authorizing Provider  acetaminophen (TYLENOL) 325 MG tablet Take 2 tablets (650 mg total) by mouth every 4 (four) hours as needed for headache or mild pain. 08/09/13  Yes Kilroy, Doreene Burke, PA-C  Ascorbic Acid (VITAMIN C) 1000 MG tablet Take 1,000 mg by mouth daily.   Yes [provider]  atorvastatin (LIPITOR) 40 MG tablet Take 40 mg by mouth daily at 6 PM.    Yes [provider]  bisoprolol (ZEBETA) 5 MG tablet Take 1.5 tablets (7.5 mg total) by mouth daily. 02/23/17  Yes Larey Dresser, MD  Cholecalciferol (VITAMIN D-3) 1000 units CAPS Take 1,000 Units by mouth daily. Reported on 02/26/2015   Yes [provider]  Binger 125 MCG tablet [place] DAY Patient taking differently: Take 0.125 mg by mouth daily.  01/28/17  Yes Larey Dresser, MD  fluticasone [facility]) 50 MCG/ACT nasal spray Place 2 sprays into both nostrils daily. Patient taking differently: Place 2 sprays into both nostrils as needed.  06/04/16  Yes McGowen, Adrian Blackwater, MD  gabapentin (NEURONTIN)  100 MG capsule TAKE TWO CAPSULES BY MOUTH THREE TIMES DAILY Patient taking differently: Take 200 mg by mouth 2 (two) times daily.  08/09/17  Yes McGowen, Adrian Blackwater, MD  guaiFENesin (MUCINEX) 600 MG 12 hr tablet Take 2 tablets (1,200 mg total) by mouth 2 (two) times daily. 11/02/14  Yes Elmahi, Rae Lips, MD  hyoscyamine (LEVSIN SL) 0.125 MG SL tablet TAKE 1 TO 2 TABLETS BY MOUTH EVERY 6 HOURS AS NEEDED FOR DIARRHEA Patient taking differently: Take 0.125-0.25 mg by mouth as needed for cramping. AS NEEDED FOR DIARRHEA 06/03/17  Yes McGowen, Adrian Blackwater, MD  Icosapent Ethyl (VASCEPA) 1 g CAPS 2 capsules twice a day Patient taking differently: Take 2 g by mouth 2 (two) times daily.  08/10/17  Yes Elayne Snare, MD  Insulin Disposable Pump (V-GO 20) KIT Apply one Vgo 20 pump daily. 04/06/17  Yes Elayne Snare, MD  insulin regular human CONCENTRATED (HUMULIN R) 500 UNIT/ML injection INJECT 56 UNITS SUBCUTANEOUSLY DAILY IN INSULIN PUMP 09/22/17  Yes Elayne Snare, MD  isosorbide mononitrate (IMDUR) 30 MG 24 hr tablet TAKE ONE TABLET BY MOUTH EVERY DAY Patient taking differently: Take 30 mg by mouth daily.  06/09/17  Yes McGowen, Adrian Blackwater, MD  lipase/protease/amylase (CREON) 36000 UNITS CPEP capsule Take 2 capsules (72,000 Units total) by mouth 3 (three) times daily with meals. Patient taking differently: Take 72,000 Units by mouth daily with breakfast.  09/02/17  Yes Danis, Estill Cotta III, MD  lisinopril (PRINIVIL,ZESTRIL) 10 MG tablet Take 1 tablet (10 mg total) by mouth daily. 08/23/17  Yes Larey Dresser, MD  mirtazapine (REMERON) 45 MG tablet Take 1 tablet (45 mg total) by mouth at bedtime. 02/09/17  Yes McGowen, Adrian Blackwater, MD  Multiple Vitamin (MULTIVITAMIN WITH MINERALS) TABS Take 1 tablet by mouth daily.   Yes [provider]  nitroGLYCERIN (NITROSTAT) 0.4 MG SL tablet PLACE 1 TABLET UNDER THE TONGUE EVERY 5 MINUTES AS NEEDED FOR CHEST PAIN Patient taking differently: Place 0.4 mg under the tongue every 5 (five) minutes  as needed for chest pain.  03/30/17  Yes Larey Dresser, MD  PROAIR HFA 108 [phone] [place]) MCG/ACT inhaler INHALE 2 PUFFS INTO THE LUNGS EVERY 4 HOURS AS NEEDED FOR WHEEZING ORSHORTNESS OF BREATH Patient taking differently: Inhale 2 puffs into the lungs every 4 (four) hours as needed for wheezing or shortness of breath.  06/05/16  Yes McGowen, Adrian Blackwater, MD  rivaroxaban (XARELTO) 20 MG TABS tablet Take 1 tablet (20 mg total) by mouth daily with supper. 08/09/17  Yes Larey Dresser, MD  sertraline (ZOLOFT) 25 MG tablet TAKE TWO TABLETS BY MOUTH EVERY DAY Patient taking differently: Take 50 mg by mouth at bedtime.  05/25/17  Yes McGowen, Adrian Blackwater, MD  spironolactone (ALDACTONE) 25 MG tablet TAKE ONE TABLET BY MOUTH EVERY DAY Patient taking differently: Take 25 mg by  mouth daily.  01/22/17  Yes Alara , Shaune Pascal, MD  SYMBICORT 160-4.5 MCG/ACT inhaler INHALE 2 PUFFS INTO THE LUNGS TWICE DAILY Patient taking differently: Inhale 2 puffs into the lungs 2 (two) times daily.  12/01/16  Yes McGowen, Adrian Blackwater, MD  SYNJARDY 05-998 MG TABS TAKE ONE TABLET BY MOUTH TWICE DAILY AFTER A MEAL Patient taking differently: Take 1 tablet by mouth 2 (two) times daily.  09/14/17  Yes Elayne Snare, MD  tamsulosin (FLOMAX) 0.4 MG CAPS capsule TAKE 1 CAPSULE BY MOUTH EVERY DAY AFTER SUPPER Patient taking differently: Take 0.4 mg by mouth daily after supper.  07/16/17  Yes McGowen, Adrian Blackwater, MD  torsemide (DEMADEX) 20 MG tablet TAKE TWO TABLETS BY MOUTH EVERY DAY Patient taking differently: 40 mg daily.  03/04/17  Yes Larey Dresser, MD  ACCU-CHEK GUIDE test strip USE TO CHECK BLOOD SUGAR 3 TIMES DAILY 07/27/16   McGowen, Adrian Blackwater, MD    Past Medical History: Past Medical History:  Diagnosis Date  . Acute respiratory distress 06/04/2016  . Adenomatous colon polyp 10/16/2011   Repeat 2018  . Arthritis     Hips, R>L & KNEES  . CAD, multiple vessel    3V CAD cath 08/08/13----CABG done shortly after.  Cath 06/2016: clear  bipasses/no interventional lesion.  . 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 pylori neg, no dysplasia or malignancy.  . Chronic combined systolic and diastolic CHF, NYHA class 2 ([place]) 2016-2018   Followed by Advanced CHF clinic.  Transesoph Echo 09/2016 EF improved to 40-45%; diffuse hypokinesis, Grd I DD.  [name] Chronic renal insufficiency, stage 3 (moderate) (HCC) 2018   GFR 50s  . COPD (chronic obstructive pulmonary disease) ([place])    GOLD II.  Spirometry  2004 borderline obstruction; 2015 mod obst: noncompliant with bid symbicort so pulmonologist switched him to a once daily inhaler: Trelegy ellipta 12/2015.  Oxygen prn: goal 88-92%.  . Diabetic nephropathy ([place])    Elevated urine microalb/cr 03/2011  . DM type 2 (diabetes mellitus, type 2) ([place])    Poor control on max oral meds--pt eventually agreed to insulin therapy.  As of 2017 his DM is being managed by Dr. Dwyane Dee in endo.  On insulin pump as of 04/2017 endo f/u.  [name] DOE (dyspnea on exertion)    COPD + chronic diastolic HF  . Dysthymia    Worsened after 2015 heart surgery.  Memory loss +? as well.  Neuro eval 01/2017--plan is to get MRI brain, B12 level, and neuropsychiatric testing.  . Erectile dysfunction    Normal testosterone  . Hearing loss of both ears 2016   Hearing aids  . Hyperplastic colon polyp 2001   No further colonoscopies to be done as of 11/2016 GI eval.  . Hypertension   . IBS (irritable bowel syndrome)    responds relatively well to prn levsin.  Pepto bismol q AM trial by Dr. Loletha Carrow 08/2017, also checking fecal elastace and lactoferrin.  . Iron deficiency anemia 2014   03/2012 capsule endoscopy showed 2 AVMs--likely responsible for his IDA--lifetime iron supp recommended + q58moCBCs.  . Ischemic cardiomyopathy 06/2016; 09/2016   06/2016: EF 25-30%, global hypokinesis, grd II DD, vent septum motion changes c/w LBBB, mod MV regurg, L atr mod/sev dilat, R atr mod dilat, mod incr pulm press, Grd  II DD.  09/2016--EF 40-45%; diffuse hypokinesis, Grd I DD.  . Macular degeneration, dry    Mild, bilat (Optometrist, DMayford Knifeat [employer]  [place] in [place], [place])  . Mild cognitive impairment    MCI, likley vascular etiology, + MDD, recurrent-->neuropsych testing 06/2017.  Neurol to repeat testing 1 yr  . Mixed hyperlipidemia    statin and vascepa as of 04/2017  . Nocturnal hypoxemia   . Obesity   . Open toe wound 11/2015   Wound care clinic appt made and then canceled when toe improved.  . Other and unspecified angina pectoris   . PAD (peripheral artery disease) ([place]) 02/2016   Abnormal ABI's and waveforms: LE arterial duplex ordered for f/u as per cardiologist's recommendation.  Vasc eval by Dr. Bridgett Larsson: impression was minimal PAD, recommended maximize med mgmt.  [name] PAF (paroxysmal atrial fibrillation) ([place]) 10/2014   xarelto  . Pericardial effusion with cardiac tamponade 6//29/15   pericardiocentesis was done,  Infectious/inflamm (cytology showed NO MALIGNANT CELLS)  . Pleural plaque    Pleural plaques/asbestosis changes on CT chest done by pulm 05/2016.  [name] Pneumonia   . Tobacco dependence in remission    100+ pack-yr hx: quit after CABG    Past Surgical History: Past Surgical History:  Procedure Laterality Date  . APPENDECTOMY  1957  . CARDIAC CATHETERIZATION  08/08/2013  . CATARACT EXTRACTION W/ INTRAOCULAR LENS  IMPLANT, BILATERAL  04/08/2006 & 04/22/2006  . CATARACT EXTRACTION W/ INTRAOCULAR LENS  IMPLANT, BILATERAL Bilateral   . CHOLECYSTECTOMY OPEN  1999  . COLONOSCOPY  10/16/2011   Procedure: COLONOSCOPY;  Surgeon: Inda Castle, MD;  Location: [facility];  Service: Endoscopy;  Laterality: N/A;  . CORONARY ARTERY BYPASS GRAFT N/A 08/14/2013   Procedure: CORONARY ARTERY BYPASS GRAFTING (CABG);  Surgeon: Melrose Nakayama, MD;  Location: [facility];  Service: Open Heart Surgery;  Laterality: N/A;  Times 4   using left internal mammary artery and endoscopically harvested  bilateral saphenous vein  . ESOPHAGOGASTRODUODENOSCOPY  02/25/12   Atrophic gastritis with a few erosions--capsule endoscopy planned as of 02/25/12 (Dr. Deatra Ina).  [name] HARDWARE REMOVAL Right 08/12/2012   Procedure: HARDWARE REMOVAL;  Surgeon: Mcarthur Rossetti, MD;  Location: [facility];  Service: Orthopedics;  Laterality: Right;  . HIP SURGERY Right 1954   Repair of slipped capital femoral epiphysis.  . INTRAOPERATIVE TRANSESOPHAGEAL ECHOCARDIOGRAM N/A 08/14/2013   Normal LV function. Procedure: INTRAOPERATIVE TRANSESOPHAGEAL ECHOCARDIOGRAM;  Surgeon: Melrose Nakayama, MD;  Location: [facility];  Service: Open Heart Surgery;  Laterality: N/A;  . LEFT HEART CATHETERIZATION WITH CORONARY ANGIOGRAM N/A 08/08/2013   Procedure: LEFT HEART CATHETERIZATION WITH CORONARY ANGIOGRAM;  Surgeon: Jettie Booze, MD;  Location: [facility] CATH LAB;  Service: Cardiovascular;  Laterality: N/A;  . PATELLA FRACTURE SURGERY Left ~ 1979   bolt + 3 screws to repair tib plateau fx  . PERICARDIAL TAP N/A 07/01/2013   Procedure: PERICARDIAL TAP;  Surgeon: Jettie Booze, MD;  Location: [facility] CATH LAB;  Service: Cardiovascular;  Laterality: N/A;  . RIGHT/LEFT HEART CATH AND CORONARY ANGIOGRAPHY N/A 06/10/2016   Procedure: Right/Left Heart Cath and Coronary Angiography;  Surgeon: Larey Dresser, MD;  Location: [facility] CV LAB;  Service: Cardiovascular;  Laterality: N/A;  . TESTICLE SURGERY  as a child   Undescended testicle brought down into scrotum  . TONSILLECTOMY  1947  . TOTAL HIP ARTHROPLASTY Right 08/12/2012   Procedure: REMOVAL OF OLD PINS RIGHT HIP AND RIGHT TOTAL HIP ARTHROPLASTY ANTERIOR APPROACH;  Surgeon: Mcarthur Rossetti, MD;  Location: [facility];  Service: Orthopedics;  Laterality: Right;  . TRANSESOPHAGEAL ECHOCARDIOGRAM  09/22/2016   EF 40-45%; diffuse  hypokinesis, Grd I DD.  [name] TRANSTHORACIC ECHOCARDIOGRAM  10/30/14   Mod LVH, EF 60-65%, normal wall motion, mod mitral regurg, mild PAH  . TRANSTHORACIC  ECHOCARDIOGRAM  06/2016   06/2016: EF 25-30%, global hypokinesis, grd II DD, vent septum motion changes c/w LBBB, mod MV regurg, L atr mod/sev dilat, R atr mod dilat, mod incr pulm press, Grd II DD.    Family History: Family History  Problem Relation Age of Onset  . Heart disease Father   . Heart attack Father   . CVA Mother   . Hypertension Mother   . Diabetes Paternal Grandmother   . Breast cancer Sister   . Diabetes Maternal Uncle   . Heart disease Maternal Uncle   . Stroke Neg Hx     Social History: Social History   Socioeconomic History  . Marital status: Married    Spouse name: [name]  . Number of children: 4  . Years of education: Not on file  . Highest education level: Not on file  Occupational History  . Occupation: retired, former Scientist, forensic  Social Needs  . Financial resource strain: Not on file  . Food insecurity:    Worry: Not on file    Inability: Not on file  . Transportation needs:    Medical: Not on file    Non-medical: Not on file  Tobacco Use  . Smoking status: Former Smoker    Packs/day: 0.25    Years: 62.00    Pack years: 15.50    Types: Cigarettes    Last attempt to quit: 10/23/2015    Years since quitting: 1.9  . Smokeless tobacco: Former Systems developer    Quit date: 06/30/2013  Substance and Sexual Activity  . Alcohol use: No    Alcohol/week: 0.0 standard drinks  . Drug use: No  . Sexual activity: Never  Lifestyle  . Physical activity:    Days per week: Not on file    Minutes per session: Not on file  . Stress: Not on file  Relationships  . Social connections:    Talks on phone: Not on file    Gets together: Not on file    Attends religious service: Not on file    Active member of club or organization: Not on file    Attends meetings of clubs or organizations: Not on file    Relationship status: Not on file  Other Topics Concern  . Not on file  Social History Narrative   Married, 4 children.   Formerly a Dance movement psychotherapist.   Level of education: HS.     +tobacco--lifelong/ quit 2015).  No alcohol or drugs.   No exercise.  +excessive caffeine.      Lives in 1 story home with his wife and her sister    Allergies:  Allergies  Allergen Reactions  . Morphine And Related Other (See Comments)    Drenched with perspiration  . Entresto [Sacubitril-Valsartan]     hypotension  . Demerol [Meperidine] Nausea Only  . Starlix [Nateglinide] Other (See Comments)    gassy    Objective:    Vital Signs:   Temp:  [98.5 F (36.9 C)] 98.5 F (36.9 C) (09/26 1158) Pulse Rate:  [41-51] 51 (09/26 1400) Resp:  [13-17] 14 (09/26 1400) BP: (90-111)/(39-50) 102/50 (09/26 1400) SpO2:  [97 %-100 %] 97 % (09/26 1400) Weight:  [101.6 kg] 101.6 kg (09/26 1213)    Weight change: Filed Weights   09/30/17 1213  Weight: 101.6 kg  Intake/Output:   Intake/Output Summary (Last 24 hours) at 09/30/2017 1437 Last data filed at 09/30/2017 1435 Gross per 24 hour  Intake 110 ml  Output 400 ml  Net -290 ml      Physical Exam    General:  Fatigued appearing. NAD  HEENT: normal Neck: supple. JVP 6-7 cm. Carotids 2+ bilat; no bruits. No lymphadenopathy or thyromegaly appreciated. Cor: PMI nondisplaced. Regular, brady. No rubs, gallops or murmurs. Lungs: CTAB Abdomen: soft, nontender, nondistended. No hepatosplenomegaly. No bruits or masses. Good bowel sounds. Extremities: no cyanosis, clubbing, rash, or edema. Warm to the touch.  Neuro: alert & orientedx3, cranial nerves grossly intact. moves all 4 extremities w/o difficulty. Affect pleasant  Telemetry   Sinus brady 50-60s, personally reviewed.   EKG    EKG shows marked sinus bradycardia at 39 bpm, personally reviewed.  Labs   Basic Metabolic Panel: Recent Labs  Lab 09/30/17 1247  NA 136  K 5.6*  CL 110  GLUCOSE 114*  BUN 114*  CREATININE 2.10*    Liver Function Tests: No results for input(s): AST, ALT, ALKPHOS, BILITOT, PROT, ALBUMIN in  the last 168 hours. No results for input(s): LIPASE, AMYLASE in the last 168 hours. No results for input(s): AMMONIA in the last 168 hours.  CBC: Recent Labs  Lab 09/30/17 1237 09/30/17 1247  WBC 9.2  --   NEUTROABS 6.6  --   HGB 15.4 15.6  HCT 48.7 46.0  MCV 87.1  --   PLT 163  --     Cardiac Enzymes: No results for input(s): CKTOTAL, CKMB, CKMBINDEX, TROPONINI in the last 168 hours.  BNP: BNP (last 3 results) Recent Labs    09/30/17 1237  BNP 166.4*    ProBNP (last 3 results) No results for input(s): PROBNP in the last 8760 hours.   CBG: No results for input(s): GLUCAP in the last 168 hours.  Coagulation Studies: No results for input(s): LABPROT, INR in the last 72 hours.   Imaging   Dg Chest Portable 1 View  Result Date: 09/30/2017 CLINICAL DATA:  Weakness and shortness of breath with exertion for the past 2-3 weeks. EXAM: PORTABLE CHEST 1 VIEW COMPARISON:  08/27/2016. Chest CT dated 06/02/2016. FINDINGS: Normal sized heart. Stable post CABG changes. Previously demonstrated calcified pleural plaques. Stable mildly prominent interstitial markings. Thoracic spine degenerative changes and mild bilateral shoulder degenerative changes. IMPRESSION: 1. No acute abnormality. 2. Stable mild chronic interstitial lung disease. 3. Previously demonstrated bilateral calcified pleural plaques compatible with previous asbestos exposure. Electronically Signed   By: Claudie Revering M.D.   On: 09/30/2017 13:28      Medications:     Current Medications:   Infusions: . sodium chloride 500 mL (09/30/17 1435)       Patient Profile   ANDRA MATSUO is a 77 y.o. male  with PMH of chronic systolic CHF, COPD, history of asbestos exposure, HTN, HLD, and DM.   Presented to [facility] 09/30/17 with generalized weakness, AKI, and bradycardia.   Assessment/Plan   1. ARF on CKD III - Baseline Cr ~1.3 - Cr 1.9 - 2.0 on admit. Follow.  - ? ATN, though no clear etiology of hypotension at  this time other than ? Polypharmacy.  - Needs repeat Echo.   2. Acute on chronic systolic CHF: Ischemic cardiomyopathy.   - Echo 9/18 with EF 40-45%, septal-lateral dyssynchrony. (Improved from 25-30% 06/2016) - Volume status looks OK on exam.  - Hold torsemide. OK for gentle IVF. Watch closely for  volume overload.  - Hold losartan with AKI. He did not tolerate low dose Entresto due to hypotension.  - Hold digoxin with AKI and level 1.4.  - Hold bisoprolol with bradycardia - Hold spiro with AKI and hyperK.  - Repeat Echo.   3. COPD - Not smoking. Not on home O2.   4. CAD s/p CABG:  - Cath 06/10/16 with severe 3 vessel native disease with patent bypass grafts. No interventional target.  - Trop 0.11. Trend. ? Worsening bypass CAD. No CP.  - Continue statin.  With stable CAD and Xarelto use, does not need to be on ASA.   5. Type II diabetes:  - Continue empagliflozin chronically. No change.   6. Paroxysmal Afib - NSR/sinus brady today  - On chronic Xarelto  No clear etiology of his symptoms or ARF. Lactic acid elevated. PCT pending. DDx includes low output CHF vs  Early sepsis vs hypotension > ATN. We will follow along.   Medication concerns reviewed with patient and pharmacy team. Barriers identified: None at this time.   Length of Stay: 0  Annamaria Helling  09/30/2017, 2:37 PM  Advanced Heart Failure Team Pager [phone] (M-F; 7a - 4p)  Please contact [facility] for night-coverage after hours (4p -7a ) and weekends on [email or web address]  Patient seen and examined with the above-signed Advanced Practice Provider and/or Housestaff. I personally reviewed laboratory data, imaging studies and relevant notes. I independently examined the patient and formulated the important aspects of the plan. I have edited the note to reflect any of my changes or salient points. I have personally discussed the plan with the patient and/or family.  77 y/o male with CAD s/p CABG and ischemic CM  with most recent EF 40-45% in 9/18. Now presents with 2-3 weeks progressive fatigue and decreased appetite. Denies edema, orthopnea or PND. No fevers or chills. Has been compliant with his meds.   In ER. Labs show mild AKI and lactate 2.6. BNP 116. On exam no JVD or edema. Lungs clear. MMS dry. Bradycardic and regular. No s3. I am not sure that his symptoms are primarily cardiac in nature. Appears dry on exam and well perfused with warm extremities. Agree with gentle fluid resuscitation. Place PICC to check co-ox. If unable to get PICC would consider RHC tomorrow to assess output directly.   Glori Bickers, MD  4:30 PM

## 2017-09-30 NOTE — Progress Notes (Signed)
[facility] informed patient Lactic acid 2.6. Arthor Captain LPN

## 2017-09-30 NOTE — Progress Notes (Signed)
[facility] notified patient trop 0.20 patient is asymptomatic. Arthor Captain LPN

## 2017-09-30 NOTE — Progress Notes (Signed)
Pt arrived to the unit oriented to the unit, Tele box 40 placed on pt. MD paged x2 for admission orders

## 2017-10-01 ENCOUNTER — Ambulatory Visit: Payer: Self-pay | Admitting: Endocrinology

## 2017-10-01 ENCOUNTER — Observation Stay (HOSPITAL_COMMUNITY): Payer: Medicare Other

## 2017-10-01 DIAGNOSIS — I130 Hypertensive heart and chronic kidney disease with heart failure and stage 1 through stage 4 chronic kidney disease, or unspecified chronic kidney disease: Secondary | ICD-10-CM | POA: Diagnosis present

## 2017-10-01 DIAGNOSIS — Z885 Allergy status to narcotic agent status: Secondary | ICD-10-CM | POA: Diagnosis not present

## 2017-10-01 DIAGNOSIS — F17210 Nicotine dependence, cigarettes, uncomplicated: Secondary | ICD-10-CM | POA: Diagnosis present

## 2017-10-01 DIAGNOSIS — I5043 Acute on chronic combined systolic (congestive) and diastolic (congestive) heart failure: Secondary | ICD-10-CM | POA: Diagnosis present

## 2017-10-01 DIAGNOSIS — E782 Mixed hyperlipidemia: Secondary | ICD-10-CM | POA: Diagnosis present

## 2017-10-01 DIAGNOSIS — I2510 Atherosclerotic heart disease of native coronary artery without angina pectoris: Secondary | ICD-10-CM | POA: Diagnosis present

## 2017-10-01 DIAGNOSIS — E669 Obesity, unspecified: Secondary | ICD-10-CM | POA: Diagnosis present

## 2017-10-01 DIAGNOSIS — Z96641 Presence of right artificial hip joint: Secondary | ICD-10-CM | POA: Diagnosis present

## 2017-10-01 DIAGNOSIS — R7989 Other specified abnormal findings of blood chemistry: Secondary | ICD-10-CM | POA: Diagnosis present

## 2017-10-01 DIAGNOSIS — I255 Ischemic cardiomyopathy: Secondary | ICD-10-CM | POA: Diagnosis present

## 2017-10-01 DIAGNOSIS — R001 Bradycardia, unspecified: Secondary | ICD-10-CM

## 2017-10-01 DIAGNOSIS — I5032 Chronic diastolic (congestive) heart failure: Secondary | ICD-10-CM

## 2017-10-01 DIAGNOSIS — E1151 Type 2 diabetes mellitus with diabetic peripheral angiopathy without gangrene: Secondary | ICD-10-CM | POA: Diagnosis present

## 2017-10-01 DIAGNOSIS — E875 Hyperkalemia: Secondary | ICD-10-CM | POA: Diagnosis not present

## 2017-10-01 DIAGNOSIS — E1122 Type 2 diabetes mellitus with diabetic chronic kidney disease: Secondary | ICD-10-CM | POA: Diagnosis present

## 2017-10-01 DIAGNOSIS — H9193 Unspecified hearing loss, bilateral: Secondary | ICD-10-CM | POA: Diagnosis present

## 2017-10-01 DIAGNOSIS — N179 Acute kidney failure, unspecified: Secondary | ICD-10-CM | POA: Diagnosis not present

## 2017-10-01 DIAGNOSIS — Z951 Presence of aortocoronary bypass graft: Secondary | ICD-10-CM | POA: Diagnosis not present

## 2017-10-01 DIAGNOSIS — I340 Nonrheumatic mitral (valve) insufficiency: Secondary | ICD-10-CM

## 2017-10-01 DIAGNOSIS — E118 Type 2 diabetes mellitus with unspecified complications: Secondary | ICD-10-CM

## 2017-10-01 DIAGNOSIS — N170 Acute kidney failure with tubular necrosis: Secondary | ICD-10-CM

## 2017-10-01 DIAGNOSIS — I447 Left bundle-branch block, unspecified: Secondary | ICD-10-CM | POA: Diagnosis present

## 2017-10-01 DIAGNOSIS — N183 Chronic kidney disease, stage 3 (moderate): Secondary | ICD-10-CM | POA: Diagnosis not present

## 2017-10-01 DIAGNOSIS — J449 Chronic obstructive pulmonary disease, unspecified: Secondary | ICD-10-CM | POA: Diagnosis present

## 2017-10-01 DIAGNOSIS — Z452 Encounter for adjustment and management of vascular access device: Secondary | ICD-10-CM | POA: Diagnosis not present

## 2017-10-01 DIAGNOSIS — H3530 Unspecified macular degeneration: Secondary | ICD-10-CM | POA: Diagnosis present

## 2017-10-01 DIAGNOSIS — I480 Paroxysmal atrial fibrillation: Secondary | ICD-10-CM | POA: Diagnosis present

## 2017-10-01 DIAGNOSIS — Z888 Allergy status to other drugs, medicaments and biological substances status: Secondary | ICD-10-CM | POA: Diagnosis not present

## 2017-10-01 DIAGNOSIS — Z6831 Body mass index (BMI) 31.0-31.9, adult: Secondary | ICD-10-CM | POA: Diagnosis not present

## 2017-10-01 DIAGNOSIS — I959 Hypotension, unspecified: Secondary | ICD-10-CM | POA: Diagnosis present

## 2017-10-01 LAB — CBC
HCT: 44.8 % (ref 39.0–52.0)
HEMOGLOBIN: 14.3 g/dL (ref 13.0–17.0)
MCH: 27.6 pg (ref 26.0–34.0)
MCHC: 31.9 g/dL (ref 30.0–36.0)
MCV: 86.5 fL (ref 78.0–100.0)
Platelets: 148 10*3/uL — ABNORMAL LOW (ref 150–400)
RBC: 5.18 MIL/uL (ref 4.22–5.81)
RDW: 16.4 % — ABNORMAL HIGH (ref 11.5–15.5)
WBC: 9.1 10*3/uL (ref 4.0–10.5)

## 2017-10-01 LAB — ECHOCARDIOGRAM COMPLETE
Height: 71 in
WEIGHTICAEL: 3531.2 [oz_av]

## 2017-10-01 LAB — BASIC METABOLIC PANEL
Anion gap: 12 (ref 5–15)
BUN: 85 mg/dL — ABNORMAL HIGH (ref 8–23)
CHLORIDE: 110 mmol/L (ref 98–111)
CO2: 17 mmol/L — AB (ref 22–32)
CREATININE: 1.65 mg/dL — AB (ref 0.61–1.24)
Calcium: 9.3 mg/dL (ref 8.9–10.3)
GFR calc non Af Amer: 38 mL/min — ABNORMAL LOW (ref >60)
GFR, EST AFRICAN AMERICAN: 44 mL/min — AB (ref >60)
Glucose, Bld: 275 mg/dL — ABNORMAL HIGH (ref 70–99)
Potassium: 5.5 mmol/L — ABNORMAL HIGH (ref 3.5–5.1)
Sodium: 139 mmol/L (ref 135–145)

## 2017-10-01 LAB — URINE CULTURE: Culture: NO GROWTH

## 2017-10-01 LAB — COOXEMETRY PANEL
Carboxyhemoglobin: 1.7 % — ABNORMAL HIGH (ref 0.5–1.5)
Methemoglobin: 1.8 % — ABNORMAL HIGH (ref 0.0–1.5)
O2 SAT: 72.5 %
TOTAL HEMOGLOBIN: 13.5 g/dL (ref 12.0–16.0)

## 2017-10-01 LAB — HEMOGLOBIN A1C
Hgb A1c MFr Bld: 9 % — ABNORMAL HIGH (ref 4.8–5.6)
Mean Plasma Glucose: 211.6 mg/dL

## 2017-10-01 LAB — GLUCOSE, CAPILLARY
GLUCOSE-CAPILLARY: 264 mg/dL — AB (ref 70–99)
GLUCOSE-CAPILLARY: 335 mg/dL — AB (ref 70–99)
Glucose-Capillary: 319 mg/dL — ABNORMAL HIGH (ref 70–99)
Glucose-Capillary: 280 mg/dL — ABNORMAL HIGH (ref 70–99)

## 2017-10-01 LAB — LACTIC ACID, PLASMA: Lactic Acid, Venous: 2 mmol/L (ref 0.5–1.9)

## 2017-10-01 LAB — TROPONIN I
Troponin I: 0.18 ng/mL (ref <0.03)
Troponin I: 0.17 ng/mL (ref <0.03)

## 2017-10-01 MED ORDER — INSULIN GLARGINE 100 UNIT/ML ~~LOC~~ SOLN
50.0000 [IU] | Freq: Every day | SUBCUTANEOUS | Status: DC
  Administered 2017-10-01 – 2017-10-02 (×2): 50 [IU] via SUBCUTANEOUS
  Filled 2017-10-01 (×2): qty 0.5

## 2017-10-01 MED ORDER — PATIROMER SORBITEX CALCIUM 8.4 G PO PACK
8.4000 g | PACK | Freq: Once | ORAL | Status: AC
  Administered 2017-10-01: 8.4 g via ORAL
  Filled 2017-10-01: qty 1

## 2017-10-01 MED ORDER — INSULIN ASPART 100 UNIT/ML ~~LOC~~ SOLN
10.0000 [IU] | Freq: Three times a day (TID) | SUBCUTANEOUS | Status: DC
  Administered 2017-10-01 – 2017-10-03 (×6): 10 [IU] via SUBCUTANEOUS

## 2017-10-01 NOTE — Progress Notes (Signed)
Patient's heart rate down to 30 with 2.62 second pause and back to SB with 1st degree heartblock and rate of 50.  Dr. Eliseo Squires notified via text page.  Poison control called to get update and above information given. Confirmed that last digoxin level was 1.2 from yesterday afternoon.

## 2017-10-01 NOTE — Progress Notes (Signed)
Lantus Insulin ordered for 1pm and not available.  Message to pharmace to send missing dose. Patient updated on plan of care

## 2017-10-01 NOTE — Progress Notes (Addendum)
Advanced Heart Failure Rounding Note  PCP-Cardiologist: No primary care provider on file.   Subjective:    Yesterday all cardiac meds held due to bradycardia and AKI.   Heart rate up to 60 today. K remains elevated 5.5    Denies SOB. Denies dizziness.    Objective:   Weight Range: 100.1 kg Body mass index is 30.78 kg/m.   Vital Signs:   Temp:  [98.1 F (36.7 C)-98.5 F (36.9 C)] 98.4 F (36.9 C) (09/27 0446) Pulse Rate:  [41-59] 57 (09/27 0446) Resp:  [13-19] 18 (09/27 0446) BP: (90-135)/(39-93) 135/41 (09/27 0446) SpO2:  [93 %-100 %] 93 % (09/27 0446) Weight:  [100.1 kg-101.8 kg] 100.1 kg (09/27 0307) Last BM Date: 09/29/17  Weight change: Filed Weights   09/30/17 1213 09/30/17 1706 10/01/17 0307  Weight: 101.6 kg 101.8 kg 100.1 kg    Intake/Output:   Intake/Output Summary (Last 24 hours) at 10/01/2017 0934 Last data filed at 10/01/2017 0255 Gross per 24 hour  Intake 850 ml  Output 900 ml  Net -50 ml      Physical Exam    General:  Well appearing. No resp difficulty. In bed.  HEENT: Normal Neck: Supple. JVP 5-6 . Carotids 2+ bilat; no bruits. No lymphadenopathy or thyromegaly appreciated. Cor: PMI nondisplaced. Regular rate & rhythm. No rubs, gallops or murmurs. Lungs: Clear Abdomen: Soft, nontender, nondistended. No hepatosplenomegaly. No bruits or masses. Good bowel sounds. Extremities: No cyanosis, clubbing, rash, edema Neuro: Alert & orientedx3, cranial nerves grossly intact. moves all 4 extremities w/o difficulty. Affect pleasant   Telemetry   Sinus Brady Sinus Rhythm 40-60s   EKG    none  Labs    CBC Recent Labs    09/30/17 1237 09/30/17 1247 10/01/17 0002  WBC 9.2  --  9.1  NEUTROABS 6.6  --   --   HGB 15.4 15.6 14.3  HCT 48.7 46.0 44.8  MCV 87.1  --  86.5  PLT 163  --  035*   Basic Metabolic Panel Recent Labs    09/30/17 1237 09/30/17 1247 10/01/17 0002  NA 137 136 139  K 5.8* 5.6* 5.5*  CL 111 110 110  CO2 15*   --  17*  GLUCOSE 117* 114* 275*  BUN 96* 114* 85*  CREATININE 1.98* 2.10* 1.65*  CALCIUM 9.0  --  9.3   Liver Function Tests Recent Labs    09/30/17 1237  AST 18  ALT 16  ALKPHOS 63  BILITOT 0.4  PROT 6.7  ALBUMIN 3.7   No results for input(s): LIPASE, AMYLASE in the last 72 hours. Cardiac Enzymes Recent Labs    09/30/17 2018 10/01/17 0002 10/01/17 0530  TROPONINI 0.20* 0.18* 0.17*    BNP: BNP (last 3 results) Recent Labs    09/30/17 1237  BNP 166.4*    ProBNP (last 3 results) No results for input(s): PROBNP in the last 8760 hours.   D-Dimer No results for input(s): DDIMER in the last 72 hours. Hemoglobin A1C Recent Labs    09/29/17 1523  HGBA1C 8.3*   Fasting Lipid Panel No results for input(s): CHOL, HDL, LDLCALC, TRIG, CHOLHDL, LDLDIRECT in the last 72 hours. Thyroid Function Tests No results for input(s): TSH, T4TOTAL, T3FREE, THYROIDAB in the last 72 hours.  Invalid input(s): FREET3  Other results:   Imaging    Dg Chest Portable 1 View  Result Date: 09/30/2017 CLINICAL DATA:  Weakness and shortness of breath with exertion for the past 2-3 weeks. EXAM:  PORTABLE CHEST 1 VIEW COMPARISON:  08/27/2016. Chest CT dated 06/02/2016. FINDINGS: Normal sized heart. Stable post CABG changes. Previously demonstrated calcified pleural plaques. Stable mildly prominent interstitial markings. Thoracic spine degenerative changes and mild bilateral shoulder degenerative changes. IMPRESSION: 1. No acute abnormality. 2. Stable mild chronic interstitial lung disease. 3. Previously demonstrated bilateral calcified pleural plaques compatible with previous asbestos exposure. Electronically Signed   By: Claudie Revering M.D.   On: 09/30/2017 13:28   [place] Ekg Site Rite  Result Date: 09/30/2017 If Site Rite image not attached, placement could not be confirmed due to current cardiac rhythm.     Medications:     Scheduled Medications: . atorvastatin  40 mg Oral q1800  .  docusate sodium  100 mg Oral BID  . gabapentin  200 mg Oral BID  . guaiFENesin  1,200 mg Oral BID  . insulin aspart  0-15 Units Subcutaneous TID WC  . insulin aspart  0-5 Units Subcutaneous QHS  . mirtazapine  45 mg Oral QHS  . mometasone-formoterol  2 puff Inhalation BID  . omega-3 acid ethyl esters  2 g Oral BID  . rivaroxaban  20 mg Oral Q supper  . sertraline  50 mg Oral QHS  . sodium chloride flush  3 mL Intravenous Q12H  . tamsulosin  0.4 mg Oral QPC supper     Infusions: . lactated ringers 75 mL/hr at 09/30/17 2333     PRN Medications:  acetaminophen **OR** acetaminophen, albuterol, hyoscyamine, nitroGLYCERIN, ondansetron **OR** ondansetron (ZOFRAN) IV, sodium chloride flush    Patient Profile   Joshua [name] is a 77 y.o. 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   Presented to [facility] 09/30/17 with generalized weakness, AKI, and bradycardia.   Assessment/Plan  ARF on CKD III - Baseline Cr ~1.3 - Cr 1.9 - 2.0 on admit. Creatinine trending down to 1.65  2. Acute on chronic systolic CHF: Ischemic cardiomyopathy.  - Echo 9/18 with EF 40-45%, septal-lateral dyssynchrony. (Improved from 25-30% 06/2016) - ECHO pending.  Off torsemide, losartan, digoxin, bisoprolol, and spiro with AKI- Hold for now.  - PICC placed for CO-OX. CO-OX 72%.  Does not need cath.   3. COPD - Not smoking. Not on home O2.   4. CAD s/p CABG:  - Cath 06/10/16 with severe 3 vessel native disease with patent bypass grafts. No interventional target.  - No chest pain.  - Continue statin.  -With stable CAD and Xarelto use, does not need to be on ASA.   5. Type II diabetes:  - Continue empagliflozin chronically. No change.   6. Paroxysmal Afib - Sinus Loletha Grayer - On chronic Xarelto  7. Hyperkalemia  K 5.8 on admit received insulin, calcium, and D5.  -Todays Potassium  -Give dose of veltassa 8.4 grams now.  - Check BMET in am.  Discussed  avoiding Mrs Deliah Boston.    Lactic Acid coming down 2.29>2.6>2 Should be able to stop IVF   Length of Stay: 0  Darrick Grinder, NP  10/01/2017, 9:34 AM  Advanced Heart Failure Team Pager [phone] (M-F; 7a - 4p)  Please contact [facility] for night-coverage after hours (4p -7a ) and weekends on [email or web address]  Patient seen with NP, agree with the above note.   Patient was admitted with profound sinus brady in the 30s along with mild hypotension.  He had AKI with markedly high BUN.  He has chronic diarrhea, had been worse but not markedly worse for  about 2 wks.  Also with poor po intake.    Cardiac meds held.  HR now up in 3s.  SBP 130s.  BUN and creatinine trending down, feels better.  Echo was done today, EF 50-55% (improved).    On exam, no JVD.  No edema.  Regular S1S2.   1. AKI on CKD stage 3: Associated with bradycardia, poor po intake, chronic diarrhea.  losartan, spironolactone, torsemide bisoprolol on hold.  HR and BP better, creatinine trending down.  2. Chronic diarrhea: uncertain etiology, GI has been following as an outpatient.  3. Bradycardia: Sinus brady in 30s on bisoprolol.  HR in 50s today, he is now off bisoprolol.  Avoid nodal blockade.  4. Chronic systolic CHF: Ischemic CMP.  Echo today with EF up to 50-55%.  Co-ox 72%.  This is not low output HF.  CXR without pulmonary edema.  - Stop digoxin permanently with improved EF.  - For now, hold losartan, spironolactone, torsemide, and bisoprolol.  5. Hypotension/elevated lactate: Related to bradycardia, poor po intake, chronic diarrhea, BP-active medication use.  This has resolved.  6. CAD: S/p CABG.  No chest pain.  Mild TnI elevation is unlikely to be ACS, probably demand ischemia from hypotension.  Would avoid cath with CKD.  7. Atrial fibrillation: Paroxysmal.  Continue Xarelto.   Loralie Champagne 10/01/2017 4:12 PM

## 2017-10-01 NOTE — Progress Notes (Signed)
Inpatient Diabetes Program Recommendations  AACE/ADA: New Consensus Statement on Inpatient Glycemic Control (2015)  Target Ranges:  Prepandial:   less than 140 mg/dL      Peak postprandial:   less than 180 mg/dL (1-2 hours)      Critically ill patients:  140 - 180 mg/dL   Lab Results  Component Value Date   GLUCAP 280 (H) 10/01/2017   HGBA1C 8.3 (A) 09/29/2017    Review of Glycemic ControlResults for MYKLE, PASCUA" (MRN [id number]) as of 10/01/2017 12:49  Ref. Range 09/30/2017 18:01 09/30/2017 23:04 10/01/2017 07:32 10/01/2017 12:26  Glucose-Capillary Latest Ref Range: 70 - 99 mg/dL 163 (H) 250 (H) 319 (H) 280 (H)   Diabetes history: Type 2 DM  Outpatient Diabetes medications:  V-GO 20 with U500 insulin (delivers 100 units of basal insulin q 24 hours) 4-6 clicks with  Meals (11-64 units tid with meals) Current orders for Inpatient glycemic control:  Novolog moderate tid with meals and HS Inpatient Diabetes Program Recommendations:   Spoke with patient. He see's Dr. Dwyane Dee for DM.  He states that he took V-GO off prior to coming to hospital.  Consider adding Lantus 50 units daily and Novolog 10 units tid with meals.  Text page sent to MD.   Thanks,  Adah Perl, RN, BC-ADM Inpatient Diabetes Coordinator Pager [phone] (8a-5p)

## 2017-10-01 NOTE — Progress Notes (Signed)
  Echocardiogram 2D Echocardiogram has been performed.  Joshua [name] 10/01/2017, 11:27 AM

## 2017-10-01 NOTE — Progress Notes (Signed)
[facility] informed of Lactic acid 2.0.Arthor Captain LPN

## 2017-10-01 NOTE — Progress Notes (Signed)
Ambulating 02 saturation as follows: 02 sat at rest on 2L = 100% 02 sat while ambulating on room air = 82% 02 sat while ambulating on 2L = 98%

## 2017-10-01 NOTE — Care Management Obs Status (Signed)
[place] NOTIFICATION   Patient Details  Name: AENGUS SAUCEDA MRN: [id number] Date of Birth: [birth year]   Medicare Observation Status Notification Given:  Yes    Carles Collet, RN 10/01/2017, 1:32 PM

## 2017-10-01 NOTE — Plan of Care (Signed)
  Problem: Pain Managment: Goal: General experience of comfort will improve Outcome: Progressing   Problem: Clinical Measurements: Goal: Cardiovascular complication will be avoided Outcome: Progressing

## 2017-10-01 NOTE — Progress Notes (Addendum)
[facility] PROGRESS NOTE  Joshua [name] SWH:[id number] DOB: June 25, 1940 DOA: 09/30/2017 PCP: Tammi Sou, MD  Assessment/Plan: 1. ARF on CKD III -creatinine trending down -Etiology uncertain. Query hypotension vs polypharmacy per cards. - Baseline Cr ~1.3 - Cr 1.9 - 2.0 on admit. Creatinine trending down to 1.65 -follow echo results  2.Acute on chronicsystolic CHF: Ischemic cardiomyopathy. - Echo 9/18 with EF 40-45%, septal-lateral dyssynchrony.(Improved from 25-30% 06/2016) -bnp 166 on admission, chest xray with No acute abnormality. Stable mild chronic interstitial lung disease  Previously demonstrated bilateral calcified pleural plaques compatible with previous asbestos exposure. - ECHO results pending. Continue to hold torsemide, losartan, digoxin, bisoprolol, and spiro with AKI - PICC placed for CO-OX. CO-OX 72%. Does not need cath.  -appreciate CHF team following  3. COPD - Not smoking. Not on home O2. -stable at baseline  4. CAD s/p CABG: -Cath 06/10/16 with severe 3 vessel native disease with patent bypass grafts. No interventional target. - No chest pain.  - Continue statin.  -continue xarelto  5. Type II diabetes: -uncontrolled A1c 8.4 06/2017 -lantus 50 units daily with meal coverage. Appreciate diabetes coordinator assistance -Continue empagliflozinchronically. No change.  6. Paroxysmal Afib - Sinus Loletha Grayer - On chronic Xarelto -dig level was high initially  7. Hyperkalemia  K 5.8 on admit received insulin, calcium, and D5.  -Todays Potassium 5.5 -Provided dose of veltassa 8.4 grams  - Check BMET in am.    Code Status: full Family Communication: at bedside Disposition Plan: to be determined   Consultants:  bensihmon cards   Procedures:  echo  Antibiotics:  none  HPI/Subjective: [facility] weakness and SOB x 2-3 weeks. Reported blood sugars running 40s to 200s, and sleeping most of the day.  Pertinent labs on admission included Cr 2.1 (up from 1.3 1 month ago), K 5.8, BUN 114, BNP 166.4, WBC 9.2, Hgb 15.6. CXR with no acute abnormality and stable mild chronic ILD.  UA negative. UCx sent. Dig level notably high at 1.4. Afebrile.   He stated he had been feeling bad for several weeks. Called multiple offices to try and get an appointment and with no availability was instructed to come to ED. He denied fevers, chills, sick contacts. Complains doe  Objective: Vitals:   10/01/17 1002 10/01/17 1229  BP: (!) 150/51 (!) 134/40  Pulse: (!) 56 60  Resp:  18  Temp:  98.4 F (36.9 C)  SpO2:  92%    Intake/Output Summary (Last 24 hours) at 10/01/2017 1435 Last data filed at 10/01/2017 1000 Gross per 24 hour  Intake 1878.54 ml  Output 500 ml  Net 1378.54 ml   Filed Weights   09/30/17 1213 09/30/17 1706 10/01/17 0307  Weight: 101.6 kg 101.8 kg 100.1 kg    Exam:   General:  Lying in bed, somewhat pale but in no acute distress  Cardiovascular: RRR no mgr no LE edema PPP  Respiratory: normal effort BS distant no wheeze no crackles  Abdomen: obese soft +BS non-tender to palpation  Musculoskeletal: joints without swelling/erythema   Data Reviewed: Basic Metabolic Panel: Recent Labs  Lab 09/30/17 1237 09/30/17 1247 10/01/17 0002  NA 137 136 139  K 5.8* 5.6* 5.5*  CL 111 110 110  CO2 15*  --  17*  GLUCOSE 117* 114* 275*  BUN 96* 114* 85*  CREATININE 1.98* 2.10* 1.65*  CALCIUM 9.0  --  9.3   Liver Function Tests: Recent Labs  Lab 09/30/17 1237  AST 18  ALT 16  ALKPHOS 63  BILITOT 0.4  PROT 6.7  ALBUMIN 3.7   No results for input(s): LIPASE, AMYLASE in the last 168 hours. No results for input(s): AMMONIA in the last 168 hours. CBC: Recent Labs  Lab 09/30/17 1237 09/30/17 1247 10/01/17 0002  WBC 9.2  --  9.1  NEUTROABS 6.6  --   --   HGB 15.4 15.6 14.3  HCT 48.7 46.0 44.8  MCV 87.1  --  86.5  PLT 163  --  148*   Cardiac Enzymes: Recent Labs  Lab  09/30/17 2018 10/01/17 0002 10/01/17 0530  TROPONINI 0.20* 0.18* 0.17*   BNP (last 3 results) Recent Labs    09/30/17 1237  BNP 166.4*    ProBNP (last 3 results) No results for input(s): PROBNP in the last 8760 hours.  CBG: Recent Labs  Lab 09/30/17 1801 09/30/17 2304 10/01/17 0732 10/01/17 1226  GLUCAP 163* 250* 319* 280*    Recent Results (from the past 240 hour(s))  Urine culture     Status: None   Collection Time: 09/30/17  1:06 PM  Result Value Ref Range Status   Specimen Description URINE, RANDOM  Final   Special Requests NONE  Final   Culture   Final    NO GROWTH Performed at [facility], [place] [address] [place], [place] [address]    Report Status 10/01/2017 FINAL  Final  MRSA PCR Screening     Status: None   Collection Time: 09/30/17  5:40 PM  Result Value Ref Range Status   MRSA by PCR NEGATIVE NEGATIVE Final    Comment:        The GeneXpert MRSA Assay (FDA approved for NASAL specimens only), is one component of a comprehensive MRSA colonization surveillance program. It is not intended to diagnose MRSA infection nor to guide or monitor treatment for MRSA infections. Performed at [facility], [place] [address] [place], [place] [address]      Studies: Dg Chest Port 1 View  Result Date: 10/01/2017 CLINICAL DATA:  PICC placement. EXAM: PORTABLE CHEST 1 VIEW COMPARISON:  Radiograph of September 30, 2017. FINDINGS: The heart size and mediastinal contours are within normal limits. Status post coronary bypass graft. No pneumothorax or pleural effusion is noted. Atherosclerosis of thoracic aorta is noted. Interval placement of right-sided PICC line with distal tip in expected position of cavoatrial junction. Both lungs are clear. The visualized skeletal structures are unremarkable. IMPRESSION: Interval placement of right-sided PICC line with distal tip in expected position of cavoatrial junction. No acute cardiopulmonary abnormality seen. Aortic  Atherosclerosis (ICD10-I70.0). Electronically Signed   By: Marijo Conception, M.D.   On: 10/01/2017 13:43   Dg Chest Portable 1 View  Result Date: 09/30/2017 CLINICAL DATA:  Weakness and shortness of breath with exertion for the past 2-3 weeks. EXAM: PORTABLE CHEST 1 VIEW COMPARISON:  08/27/2016. Chest CT dated 06/02/2016. FINDINGS: Normal sized heart. Stable post CABG changes. Previously demonstrated calcified pleural plaques. Stable mildly prominent interstitial markings. Thoracic spine degenerative changes and mild bilateral shoulder degenerative changes. IMPRESSION: 1. No acute abnormality. 2. Stable mild chronic interstitial lung disease. 3. Previously demonstrated bilateral calcified pleural plaques compatible with previous asbestos exposure. Electronically Signed   By: Claudie Revering M.D.   On: 09/30/2017 13:28   [place] Ekg Site Rite  Result Date: 09/30/2017 If Site Rite image not attached, placement could not be confirmed due to current cardiac rhythm.   Scheduled Meds: . atorvastatin  40 mg  Oral q1800  . docusate sodium  100 mg Oral BID  . gabapentin  200 mg Oral BID  . guaiFENesin  1,200 mg Oral BID  . insulin aspart  0-15 Units Subcutaneous TID WC  . insulin aspart  0-5 Units Subcutaneous QHS  . insulin aspart  10 Units Subcutaneous TID WC  . insulin glargine  50 Units Subcutaneous Daily  . mirtazapine  45 mg Oral QHS  . mometasone-formoterol  2 puff Inhalation BID  . omega-3 acid ethyl esters  2 g Oral BID  . rivaroxaban  20 mg Oral Q supper  . sertraline  50 mg Oral QHS  . sodium chloride flush  3 mL Intravenous Q12H  . tamsulosin  0.4 mg Oral QPC supper   Continuous Infusions:  Principal Problem:   Acute renal failure superimposed on stage 3 chronic kidney disease (HCC) Active Problems:   Symptomatic bradycardia   HTN (hypertension), benign   Hyperlipidemia   Diabetes mellitus with complication (HCC)   Chronic diastolic CHF (congestive heart failure) (HCC)   Medication  side effect   Tobacco dependence   PAF (paroxysmal atrial fibrillation) ([place])    Time spent: [facility]  If 7PM-7AM, please contact night-coverage at [email or web address], password [facility] 10/01/2017, 2:35 PM  LOS: 0 days

## 2017-10-01 NOTE — Progress Notes (Signed)
Bedside echo in progress.  Patiromer sorbitex given x 1 time dose for elevated potassium.  Patient and his family at bedside updated on plan of care.

## 2017-10-02 LAB — BASIC METABOLIC PANEL
ANION GAP: 7 (ref 5–15)
BUN: 53 mg/dL — AB (ref 8–23)
CALCIUM: 9.4 mg/dL (ref 8.9–10.3)
CO2: 20 mmol/L — ABNORMAL LOW (ref 22–32)
CREATININE: 1.21 mg/dL (ref 0.61–1.24)
Chloride: 111 mmol/L (ref 98–111)
GFR calc Af Amer: >60 mL/min (ref >60)
GFR, EST NON AFRICAN AMERICAN: 56 mL/min — AB (ref >60)
Glucose, Bld: 304 mg/dL — ABNORMAL HIGH (ref 70–99)
Potassium: 5.5 mmol/L — ABNORMAL HIGH (ref 3.5–5.1)
Sodium: 138 mmol/L (ref 135–145)

## 2017-10-02 LAB — GLUCOSE, CAPILLARY
GLUCOSE-CAPILLARY: 255 mg/dL — AB (ref 70–99)
Glucose-Capillary: 337 mg/dL — ABNORMAL HIGH (ref 70–99)
Glucose-Capillary: 370 mg/dL — ABNORMAL HIGH (ref 70–99)
Glucose-Capillary: 308 mg/dL — ABNORMAL HIGH (ref 70–99)

## 2017-10-02 LAB — COOXEMETRY PANEL
CARBOXYHEMOGLOBIN: 1.2 % (ref 0.5–1.5)
METHEMOGLOBIN: 1.8 % — AB (ref 0.0–1.5)
O2 SAT: 62.5 %
Total hemoglobin: 14.7 g/dL (ref 12.0–16.0)

## 2017-10-02 MED ORDER — DOCUSATE SODIUM 100 MG PO CAPS
100.0000 mg | ORAL_CAPSULE | Freq: Two times a day (BID) | ORAL | Status: DC
  Administered 2017-10-02 – 2017-10-03 (×3): 100 mg via ORAL
  Filled 2017-10-02 (×3): qty 1

## 2017-10-02 MED ORDER — INSULIN GLARGINE 100 UNIT/ML ~~LOC~~ SOLN
56.0000 [IU] | Freq: Every day | SUBCUTANEOUS | Status: DC
  Administered 2017-10-03: 56 [IU] via SUBCUTANEOUS
  Filled 2017-10-02: qty 0.56

## 2017-10-02 MED ORDER — PATIROMER SORBITEX CALCIUM 8.4 G PO PACK
8.4000 g | PACK | Freq: Once | ORAL | Status: AC
  Administered 2017-10-02: 8.4 g via ORAL
  Filled 2017-10-02: qty 1

## 2017-10-02 MED ORDER — PANCRELIPASE (LIP-PROT-AMYL) 12000-38000 UNITS PO CPEP
72000.0000 [IU] | ORAL_CAPSULE | Freq: Every day | ORAL | Status: DC
  Administered 2017-10-03: 72000 [IU] via ORAL
  Filled 2017-10-02 (×2): qty 6

## 2017-10-02 NOTE — Progress Notes (Signed)
Pt is stable, vitals stable, Veltassa powder provided mixed with water, for K 5.5, family member in bed side and is aware, pt is ambulatory in a hallway, will continue to monitor  Joshua [name], Joshua [name]

## 2017-10-02 NOTE — Progress Notes (Signed)
Progress Note  Patient Name: Joshua [name] Date of Encounter: 10/02/2017  Primary Cardiologist: Dr [name]  Subjective   No CP or dyspnea; weakness improving  Inpatient Medications    Scheduled Meds: . atorvastatin  40 mg Oral q1800  . docusate sodium  100 mg Oral BID  . gabapentin  200 mg Oral BID  . guaiFENesin  1,200 mg Oral BID  . insulin aspart  0-15 Units Subcutaneous TID WC  . insulin aspart  0-5 Units Subcutaneous QHS  . insulin aspart  10 Units Subcutaneous TID WC  . insulin glargine  50 Units Subcutaneous Daily  . mirtazapine  45 mg Oral QHS  . mometasone-formoterol  2 puff Inhalation BID  . omega-3 acid ethyl esters  2 g Oral BID  . rivaroxaban  20 mg Oral Q supper  . sertraline  50 mg Oral QHS  . sodium chloride flush  3 mL Intravenous Q12H  . tamsulosin  0.4 mg Oral QPC supper   Continuous Infusions:  PRN Meds: acetaminophen **OR** acetaminophen, albuterol, hyoscyamine, nitroGLYCERIN, ondansetron **OR** ondansetron (ZOFRAN) IV, sodium chloride flush   Vital Signs    Vitals:   10/01/17 1002 10/01/17 1229 10/01/17 2101 10/02/17 0614  BP: (!) 150/51 (!) 134/40 (!) 137/45 (!) 147/54  Pulse: (!) 56 60 (!) 50 61  Resp:  _0 Temp:  98.4 F (36.9 C) 98.4 F (36.9 C) 98.4 F (36.9 C)  TempSrc:  Oral Oral Oral  SpO2:  92% 95% 96%  Weight:    98.5 kg  Height:        Intake/Output Summary (Last 24 hours) at 10/02/2017 0759 Last data filed at 10/01/2017 2228 Gross per 24 hour  Intake 1858.54 ml  Output -  Net 1858.54 ml   Filed Weights   09/30/17 1706 10/01/17 0307 10/02/17 0614  Weight: 101.8 kg 100.1 kg 98.5 kg    Telemetry    Sinus Bradycardia with rare Mobitz 1 - Personally Reviewed  Physical Exam   GEN: No acute distress.   Neck: No JVD Cardiac: Regular and bradycardic Respiratory: Clear to auscultation bilaterally. GI: Soft, nontender, non-distended  MS: No edema Neuro:  Nonfocal  Psych: Normal affect   Labs     Chemistry Recent Labs  Lab 09/30/17 1237 09/30/17 1247 10/01/17 0002 10/02/17 0447  NA 137 136 139 138  K 5.8* 5.6* 5.5* 5.5*  CL 111 110 110 111  CO2 15*  --  17* 20*  GLUCOSE 117* 114* 275* 304*  BUN 96* 114* 85* 53*  CREATININE 1.98* 2.10* 1.65* 1.21  CALCIUM 9.0  --  9.3 9.4  PROT 6.7  --   --   --   ALBUMIN 3.7  --   --   --   AST 18  --   --   --   ALT 16  --   --   --   ALKPHOS 63  --   --   --   BILITOT 0.4  --   --   --   GFRNONAA 31*  --  38* 56*  GFRAA 35*  --  44* >60  ANIONGAP 11  --  12 7     Hematology Recent Labs  Lab 09/30/17 1237 09/30/17 1247 10/01/17 0002  WBC 9.2  --  9.1  RBC 5.59  --  5.18  HGB 15.4 15.6 14.3  HCT 48.7 46.0 44.8  MCV 87.1  --  86.5  MCH 27.5  --  27.6  MCHC 31.6  --  31.9  RDW 16.9*  --  16.4*  PLT 163  --  148*    Cardiac Enzymes Recent Labs  Lab 09/30/17 2018 10/01/17 0002 10/01/17 0530  TROPONINI 0.20* 0.18* 0.17*    Recent Labs  Lab 09/30/17 1245  TROPIPOC 0.11*     BNP Recent Labs  Lab 09/30/17 1237  BNP 166.4*     Radiology    Dg Chest Port 1 View  Result Date: 10/01/2017 CLINICAL DATA:  PICC placement. EXAM: PORTABLE CHEST 1 VIEW COMPARISON:  Radiograph of September 30, 2017. FINDINGS: The heart size and mediastinal contours are within normal limits. Status post coronary bypass graft. No pneumothorax or pleural effusion is noted. Atherosclerosis of thoracic aorta is noted. Interval placement of right-sided PICC line with distal tip in expected position of cavoatrial junction. Both lungs are clear. The visualized skeletal structures are unremarkable. IMPRESSION: Interval placement of right-sided PICC line with distal tip in expected position of cavoatrial junction. No acute cardiopulmonary abnormality seen. Aortic Atherosclerosis (ICD10-I70.0). Electronically Signed   By: Marijo Conception, M.D.   On: 10/01/2017 13:43   Dg Chest Portable 1 View  Result Date: 09/30/2017 CLINICAL DATA:  Weakness and  shortness of breath with exertion for the past 2-3 weeks. EXAM: PORTABLE CHEST 1 VIEW COMPARISON:  08/27/2016. Chest CT dated 06/02/2016. FINDINGS: Normal sized heart. Stable post CABG changes. Previously demonstrated calcified pleural plaques. Stable mildly prominent interstitial markings. Thoracic spine degenerative changes and mild bilateral shoulder degenerative changes. IMPRESSION: 1. No acute abnormality. 2. Stable mild chronic interstitial lung disease. 3. Previously demonstrated bilateral calcified pleural plaques compatible with previous asbestos exposure. Electronically Signed   By: Claudie Revering M.D.   On: 09/30/2017 13:28   [place] Ekg Site Rite  Result Date: 09/30/2017 If Site Rite image not attached, placement could not be confirmed due to current cardiac rhythm.   Patient Profile     Joshua W [name] a 77 y.o.malewith PMH of chronic systolic CHF, COPD, history of asbestos exposure, HTN, HLD, and DM.  Presented to [facility] 09/30/17 with generalized weakness, AKI, and bradycardia.Medications held.  Echocardiogram this admission shows ejection fraction has improved to 50 to 55%.  Mild diastolic dysfunction, mild mitral regurgitation and mild left atrial enlargement.  Assessment & Plan    1 bradycardia-heart rate is improving.  Continue to avoid beta blockade and digoxin has been discontinued.  Follow telemetry.  2 acute on chronic stage III kidney disease-renal function is improving.  I will continue to hold medications today.  Follow renal function closely.  Patient dehydrated/over diuresed at time of admission.  3 chronic combined systolic/diastolic congestive heart failure-LV function has improved on most recent echocardiogram.  We will continue to hold ARB, Spironolactone, Demadex and beta-blockade at this point.  We will likely resume low-dose medications as he continues to improve.  4 paroxysmal atrial fibrillation-patient in sinus rhythm.  Continue Xarelto.  No AV nodal blocking  agents for now given bradycardia at time of admission.  5 hyperkalemia-potassium remains slightly increased.  We will continue to follow.   6 coronary artery disease-continue statin.  No aspirin given need for anticoagulation.  7 minimal troponin elevation-Patient denies chest pain.  No clear trend.  Not consistent with acute coronary syndrome.  No plans for further ischemia evaluation.  8 chronic diarrhea-improved since admission.  For questions or updates, please contact [place] Please consult [email or web address] for contact info under        Signed, Kirk Ruths, MD  10/02/2017, 7:59 AM

## 2017-10-02 NOTE — Progress Notes (Signed)
Pt has a 2.4 sec sinus pause at 15:06, pt was sleeping that time, RN make him awake, vitals stable,  Denies CP and  Distress, MD aware, HR is 52 now, will continue to monitor the patient  Palma Holter, RN

## 2017-10-02 NOTE — Progress Notes (Signed)
PROGRESS NOTE    CODA MATHEY  GGY:[id number] DOB: [birth year] DOA: 09/30/2017 PCP: Tammi Sou, MD    Brief Narrative: Pt presented to [facility] 9/26 with weakness and SOB x 2-3 weeks. Reported blood sugars running 40s to 200s, and sleeping most of the day. Pertinent labs on admission included Cr 2.1 (up from 1.3 1 month ago), K 5.8, BUN 114, BNP 166.4, WBC 9.2, Hgb 15.6. CXR with no acute abnormality and stable mild chronic ILD. UA negative. UCx sent. Dig level notably high at 1.4. Afebrile.   He stated he had been feeling bad for several weeks. Called multiple offices to try and get an appointment and with no availability was instructed to come to ED. He denied fevers, chills, sick contacts. Complains doe   Assessment & Plan:   Principal Problem:   Acute renal failure superimposed on stage 3 chronic kidney disease (HCC) Active Problems:   HTN (hypertension), benign   Tobacco dependence   Hyperlipidemia   PAF (paroxysmal atrial fibrillation) (HCC)   Diabetes mellitus with complication (HCC)   Chronic diastolic CHF (congestive heart failure) (HCC)   Medication side effect   Symptomatic bradycardia   1-ARK on CKD stage III; Could have be related to hypotension vs polypharmacy.  Cr Baseline 1.3.  Peak to 2.0 this admission. Cr decreased to 1.3.   2-Hyperkalemia;  k still at 5.5  Will give another dose of veltassa Repeat labs in am.   3-Acute on chronic systolic Heart failure; ischemic cardiomyopathy;  Echo 9/18 with EF 40-45%, septal-lateral dyssynchrony.(Improved from 25-30% 06/2016) Has PICC placed.  ECHO ordered.  Holding torsemide, losartan, digoxin, bisoprolol and spironolactone.  Cardiology managing.   COPD Stable.    CAD S/P CABG -Cath 06/10/16 with severe 3 vessel native disease with patent bypass grafts. No interventional target. -continue with statins.   DM; A1c; 9 Continue with latus. Increase lantus to 56 units.     DVT prophylaxis:  xarelto Code Status: full code.  Family Communication: care discussed with wife Disposition Plan: to be determine . Monitor HR, treat hyperkalemia  Consultants:  Cardiology   Procedures:   ECHO;   Antimicrobials: none  Subjective: He denies dyspnea, had bM yesterday.   Objective: Vitals:   10/02/17 0614 10/02/17 0800 10/02/17 1242 10/02/17 1510  BP: (!) 147/54 130/60 (!) 159/61 140/80  Pulse: 61 60 (!) 57 (!) 58  Resp: _0 Temp: 98.4 F (36.9 C) 98 F (36.7 C) 98.7 F (37.1 C)   TempSrc: Oral Oral Oral   SpO2: 96% 96% 98% 98%  Weight: 98.5 kg     Height:        Intake/Output Summary (Last 24 hours) at 10/02/2017 1558 Last data filed at 10/02/2017 1030 Gross per 24 hour  Intake 720 ml  Output -  Net 720 ml   Filed Weights   09/30/17 1706 10/01/17 0307 10/02/17 0614  Weight: 101.8 kg 100.1 kg 98.5 kg    Examination:  General exam: Appears calm and comfortable  Respiratory system: Clear to auscultation. Respiratory effort normal. Cardiovascular system: S1 & S2 heard, RRR. No JVD, murmurs, rubs, gallops or clicks. No pedal edema. Gastrointestinal system: Abdomen is nondistended, soft and nontender. No organomegaly or masses felt. Normal bowel sounds heard. Central nervous system: Alert and oriented. No focal neurological deficits. Extremities: Symmetric 5 x 5 power. Skin: No rashes, lesions or ulcers Psychiatry: Judgement and insight appear normal. Mood & affect appropriate.     Data Reviewed: I have  personally reviewed following labs and imaging studies  CBC: Recent Labs  Lab 09/30/17 1237 09/30/17 1247 10/01/17 0002  WBC 9.2  --  9.1  NEUTROABS 6.6  --   --   HGB 15.4 15.6 14.3  HCT 48.7 46.0 44.8  MCV 87.1  --  86.5  PLT 163  --  341*   Basic Metabolic Panel: Recent Labs  Lab 09/30/17 1237 09/30/17 1247 10/01/17 0002 10/02/17 0447  NA 137 136 139 138  K 5.8* 5.6* 5.5* 5.5*  CL 111 110 110 111  CO2 15*  --  17* 20*  GLUCOSE 117*  114* 275* 304*  BUN 96* 114* 85* 53*  CREATININE 1.98* 2.10* 1.65* 1.21  CALCIUM 9.0  --  9.3 9.4   GFR: Estimated Creatinine Clearance: 60.2 mL/min (by C-G formula based on SCr of 1.21 mg/dL). Liver Function Tests: Recent Labs  Lab 09/30/17 1237  AST 18  ALT 16  ALKPHOS 63  BILITOT 0.4  PROT 6.7  ALBUMIN 3.7   No results for input(s): LIPASE, AMYLASE in the last 168 hours. No results for input(s): AMMONIA in the last 168 hours. Coagulation Profile: No results for input(s): INR, PROTIME in the last 168 hours. Cardiac Enzymes: Recent Labs  Lab 09/30/17 2018 10/01/17 0002 10/01/17 0530  TROPONINI 0.20* 0.18* 0.17*   BNP (last 3 results) No results for input(s): PROBNP in the last 8760 hours. HbA1C: Recent Labs    10/01/17 1453  HGBA1C 9.0*   CBG: Recent Labs  Lab 10/01/17 1226 10/01/17 1638 10/01/17 2124 10/02/17 0800 10/02/17 1149  GLUCAP 280* 264* 335* 337* 370*   Lipid Profile: No results for input(s): CHOL, HDL, LDLCALC, TRIG, CHOLHDL, LDLDIRECT in the last 72 hours. Thyroid Function Tests: No results for input(s): TSH, T4TOTAL, FREET4, T3FREE, THYROIDAB in the last 72 hours. Anemia Panel: No results for input(s): VITAMINB12, FOLATE, FERRITIN, TIBC, IRON, RETICCTPCT in the last 72 hours. Sepsis Labs: Recent Labs  Lab 09/30/17 1247 09/30/17 1534 09/30/17 1542 09/30/17 2018 10/01/17 0002  PROCALCITON  --  0.11  --   --   --   LATICACIDVEN 2.65*  --  2.29* 2.6* 2.0*    Recent Results (from the past 240 hour(s))  Urine culture     Status: None   Collection Time: 09/30/17  1:06 PM  Result Value Ref Range Status   Specimen Description URINE, RANDOM  Final   Special Requests NONE  Final   Culture   Final    NO GROWTH Performed at [facility], [place] [address] [place], [place] [address]    Report Status 10/01/2017 FINAL  Final  MRSA PCR Screening     Status: None   Collection Time: 09/30/17  5:40 PM  Result Value Ref Range Status   MRSA  by PCR NEGATIVE NEGATIVE Final    Comment:        The GeneXpert MRSA Assay (FDA approved for NASAL specimens only), is one component of a comprehensive MRSA colonization surveillance program. It is not intended to diagnose MRSA infection nor to guide or monitor treatment for MRSA infections. Performed at [facility], [place] [address], [place], [place] [address]          Radiology Studies: Dg Chest Port 1 View  Result Date: 10/01/2017 CLINICAL DATA:  PICC placement. EXAM: PORTABLE CHEST 1 VIEW COMPARISON:  Radiograph of September 30, 2017. FINDINGS: The heart size and mediastinal contours are within normal limits. Status post coronary bypass graft. No pneumothorax  or pleural effusion is noted. Atherosclerosis of thoracic aorta is noted. Interval placement of right-sided PICC line with distal tip in expected position of cavoatrial junction. Both lungs are clear. The visualized skeletal structures are unremarkable. IMPRESSION: Interval placement of right-sided PICC line with distal tip in expected position of cavoatrial junction. No acute cardiopulmonary abnormality seen. Aortic Atherosclerosis (ICD10-I70.0). Electronically Signed   By: Marijo Conception, M.D.   On: 10/01/2017 13:43   [place] Ekg Site Rite  Result Date: 09/30/2017 If Site Rite image not attached, placement could not be confirmed due to current cardiac rhythm.       Scheduled Meds: . atorvastatin  40 mg Oral q1800  . docusate sodium  100 mg Oral BID  . gabapentin  200 mg Oral BID  . guaiFENesin  1,200 mg Oral BID  . insulin aspart  0-15 Units Subcutaneous TID WC  . insulin aspart  0-5 Units Subcutaneous QHS  . insulin aspart  10 Units Subcutaneous TID WC  . insulin glargine  50 Units Subcutaneous Daily  . [START ON 10/03/2017] lipase/protease/amylase  72,000 Units Oral Q breakfast  . mirtazapine  45 mg Oral QHS  . mometasone-formoterol  2 puff Inhalation BID  . omega-3 acid ethyl esters  2 g Oral BID  .  rivaroxaban  20 mg Oral Q supper  . sertraline  50 mg Oral QHS  . sodium chloride flush  3 mL Intravenous Q12H  . tamsulosin  0.4 mg Oral QPC supper   Continuous Infusions:   LOS: 1 day    Time spent: 35 minutes.     Elmarie Shiley, MD [facility] Pager [phone]  If 7PM-7AM, please contact night-coverage [email or web address] Password [facility] 10/02/2017, 3:58 PM

## 2017-10-03 LAB — GLUCOSE, CAPILLARY
Glucose-Capillary: 373 mg/dL — ABNORMAL HIGH (ref 70–99)
Glucose-Capillary: 344 mg/dL — ABNORMAL HIGH (ref 70–99)

## 2017-10-03 LAB — BASIC METABOLIC PANEL
Anion gap: 6 (ref 5–15)
BUN: 37 mg/dL — ABNORMAL HIGH (ref 8–23)
CALCIUM: 9.1 mg/dL (ref 8.9–10.3)
CHLORIDE: 111 mmol/L (ref 98–111)
CO2: 22 mmol/L (ref 22–32)
Creatinine, Ser: 1.08 mg/dL (ref 0.61–1.24)
GFR calc non Af Amer: >60 mL/min (ref >60)
Glucose, Bld: 311 mg/dL — ABNORMAL HIGH (ref 70–99)
Potassium: 5.1 mmol/L (ref 3.5–5.1)
Sodium: 139 mmol/L (ref 135–145)

## 2017-10-03 LAB — COOXEMETRY PANEL
Carboxyhemoglobin: 1.2 % (ref 0.5–1.5)
Methemoglobin: 1.9 % — ABNORMAL HIGH (ref 0.0–1.5)
O2 Saturation: 79.4 %
Total hemoglobin: 13.7 g/dL (ref 12.0–16.0)

## 2017-10-03 MED ORDER — TORSEMIDE 20 MG PO TABS: ORAL_TABLET | ORAL | 6 refills | Status: DC

## 2017-10-03 NOTE — Discharge Instructions (Addendum)

## 2017-10-03 NOTE — Progress Notes (Signed)
Pt got discharged to home, discharge instructions provided and patient showed understanding to it, IV taken out,Telemonitor DC,pt left unit in wheelchair with all of the belongings accompanied with a family member (wife)  Joshua [name]

## 2017-10-03 NOTE — Progress Notes (Addendum)
Progress Note  Patient Name: Joshua [name] Date of Encounter: 10/03/2017  Primary Cardiologist: Dr [name]  Subjective   No CP or dyspnea; weakness resolved  Inpatient Medications    Scheduled Meds: . atorvastatin  40 mg Oral q1800  . docusate sodium  100 mg Oral BID  . gabapentin  200 mg Oral BID  . guaiFENesin  1,200 mg Oral BID  . insulin aspart  0-15 Units Subcutaneous TID WC  . insulin aspart  0-5 Units Subcutaneous QHS  . insulin aspart  10 Units Subcutaneous TID WC  . insulin glargine  56 Units Subcutaneous Daily  . lipase/protease/amylase  72,000 Units Oral Q breakfast  . mirtazapine  45 mg Oral QHS  . mometasone-formoterol  2 puff Inhalation BID  . omega-3 acid ethyl esters  2 g Oral BID  . rivaroxaban  20 mg Oral Q supper  . sertraline  50 mg Oral QHS  . sodium chloride flush  3 mL Intravenous Q12H  . tamsulosin  0.4 mg Oral QPC supper   Continuous Infusions:  PRN Meds: acetaminophen **OR** acetaminophen, albuterol, hyoscyamine, nitroGLYCERIN, ondansetron **OR** ondansetron (ZOFRAN) IV, sodium chloride flush   Vital Signs    Vitals:   10/02/17 1242 10/02/17 1510 10/02/17 1920 10/03/17 0521  BP: (!) 159/61 140/80 (!) 153/55 (!) 140/49  Pulse: (!) 57 (!) 58 (!) 59 (!) 59  Resp: _0 Temp: 98.7 F (37.1 C)  97.9 F (36.6 C) 97.9 F (36.6 C)  TempSrc: Oral  Oral Oral  SpO2: 98% 98% 92% 90%  Weight:    98.4 kg  Height:        Intake/Output Summary (Last 24 hours) at 10/03/2017 0944 Last data filed at 10/03/2017 0900 Gross per 24 hour  Intake 1220 ml  Output 2650 ml  Net -1430 ml   Filed Weights   10/01/17 0307 10/02/17 0614 10/03/17 0521  Weight: 100.1 kg 98.5 kg 98.4 kg    Telemetry    Sinus Bradycardia with first degree AV block - Personally Reviewed  Physical Exam   GEN: No acute distress.  WD/WN Neck: No JVD, supple Cardiac: RRR Respiratory: Clear to auscultation bilaterally; no wheeze. GI: Soft, NT/ND MS: No edema Neuro:   Grossly intact   Labs    Chemistry Recent Labs  Lab 09/30/17 1237  10/01/17 0002 10/02/17 0447 10/03/17 0357  NA 137   < > 139 138 139  K 5.8*   < > 5.5* 5.5* 5.1  CL 111   < > 110 111 111  CO2 15*  --  17* 20* 22  GLUCOSE 117*   < > 275* 304* 311*  BUN 96*   < > 85* 53* 37*  CREATININE 1.98*   < > 1.65* 1.21 1.08  CALCIUM 9.0  --  9.3 9.4 9.1  PROT 6.7  --   --   --   --   ALBUMIN 3.7  --   --   --   --   AST 18  --   --   --   --   ALT 16  --   --   --   --   ALKPHOS 63  --   --   --   --   BILITOT 0.4  --   --   --   --   GFRNONAA 31*  --  38* 56* >60  GFRAA 35*  --  44* >60 >60  ANIONGAP 11  --  12  7 6   < > = values in this interval not displayed.     Hematology Recent Labs  Lab 09/30/17 1237 09/30/17 1247 10/01/17 0002  WBC 9.2  --  9.1  RBC 5.59  --  5.18  HGB 15.4 15.6 14.3  HCT 48.7 46.0 44.8  MCV 87.1  --  86.5  MCH 27.5  --  27.6  MCHC 31.6  --  31.9  RDW 16.9*  --  16.4*  PLT 163  --  148*    Cardiac Enzymes Recent Labs  Lab 09/30/17 2018 10/01/17 0002 10/01/17 0530  TROPONINI 0.20* 0.18* 0.17*    Recent Labs  Lab 09/30/17 1245  TROPIPOC 0.11*     BNP Recent Labs  Lab 09/30/17 1237  BNP 166.4*     Radiology    Dg Chest Port 1 View  Result Date: 10/01/2017 CLINICAL DATA:  PICC placement. EXAM: PORTABLE CHEST 1 VIEW COMPARISON:  Radiograph of September 30, 2017. FINDINGS: The heart size and mediastinal contours are within normal limits. Status post coronary bypass graft. No pneumothorax or pleural effusion is noted. Atherosclerosis of thoracic aorta is noted. Interval placement of right-sided PICC line with distal tip in expected position of cavoatrial junction. Both lungs are clear. The visualized skeletal structures are unremarkable. IMPRESSION: Interval placement of right-sided PICC line with distal tip in expected position of cavoatrial junction. No acute cardiopulmonary abnormality seen. Aortic Atherosclerosis (ICD10-I70.0).  Electronically Signed   By: Marijo Conception, M.D.   On: 10/01/2017 13:43    Patient Profile     Foster W [name] a 77 y.o.malewith PMH of chronic systolic CHF, COPD, history of asbestos exposure, HTN, HLD, and DM.  Presented to [facility] 09/30/17 with generalized weakness, AKI, and bradycardia.Medications held.  Echocardiogram this admission shows ejection fraction has improved to 50 to 55%.  Mild diastolic dysfunction, mild mitral regurgitation and mild left atrial enlargement.  Assessment & Plan    1 bradycardia-heart rate has improved.  Will not resume digoxin or beta-blocker.    2 acute on chronic stage III kidney disease-renal function continues to improve.  Admission abnormalities secondary to dehydration from diuretics superimposed on diarrhea.    3 chronic combined systolic/diastolic congestive heart failure-LV function has improved on most recent echocardiogram.  Digoxin and beta-blocker will be permanently discontinued.  I will also continue off of Spironolactone.  Would resume lisinopril 10 mg daily at discharge.  Would treat with Demadex 20 mg daily for weight gain of 2 to 3 pounds or worsening pedal edema.   4 paroxysmal atrial fibrillation-patient in sinus rhythm.  Continue Xarelto.  No AV nodal blocking agents given bradycardia at time of admission.  5 hyperkalemia-resolved.   6 coronary artery disease-continue statin.  No aspirin given need for anticoagulation.  7 minimal troponin elevation-Patient denies chest pain.  No clear trend.  Not consistent with acute coronary syndrome.  No plans for further ischemia evaluation.  8 chronic diarrhea-improved since admission.  Patient can be discharged from a cardiac standpoint.  [facility] will sign off.   Medication Recommendations: Bisoprolol, digoxin and spironolactone have been discontinued.  Would resume lisinopril 10 mg daily beginning tomorrow.  Would change Demadex to 20 mg daily as needed for weight gain of 2 to 3  pounds or worsening pedal edema. Other recommendations (labs, testing, etc): Bmet 10/08/17 with results to Dr [name] Follow up as an outpatient:  TOC appt 1 week CHF clinic.   For questions or updates, please contact [place] Please  consult [email or web address] for contact info under        Signed, Kirk Ruths, MD  10/03/2017, 9:44 AM

## 2017-10-03 NOTE — Discharge Summary (Signed)
Physician Discharge Summary  Joshua [name] EPP:[id number] DOB: [birth year] DOA: 09/30/2017  PCP: Tammi Sou, MD  Admit date: 09/30/2017 Discharge date: 10/03/2017  Admitted From: Home  Disposition:  Home   Recommendations for Outpatient Follow-up:  1. Follow up with PCP in 1-2 weeks 2. Please obtain BMP/CBC in one week 3. Needs to repeat K level, consider resuming lisinopril if no further hyperkalemia.   Home Health: no  Discharge Condition: stable.  CODE STATUS: full code.  Diet recommendation: Heart Healthy  Brief/Interim Summary: [facility] weakness and SOB x 2-3 weeks. Reported blood sugars running 40s to 200s, and sleeping most of the day. Pertinent labs on admission included Cr 2.1 (up from 1.3 1 month ago), K 5.8, BUN 114, BNP 166.4, WBC 9.2, Hgb 15.6. CXR with no acute abnormality and stable mild chronic ILD. UA negative. UCx sent. Dig level notably high at 1.4. Afebrile.   He statedhe hadbeen feeling bad for several weeks. Called multiple offices to try and get an appointment and with no availability was instructed to come to ED. He deniedfevers, chills, sick contacts. Complains doe  1-ARK on CKD stage III; Could have be related to hypotension vs polypharmacy.  Cr Baseline 1.3.  Peak to 2.0 this admission. Cr decreased to 1.0  2-Hyperkalemia;  k peak to 5.8 Received two doses of veltassa k at 5.1 today. Hold on starting lisinopril and spironolactone.   3-Acute on chronic systolic Heart failure; ischemic cardiomyopathy;  Echo 9/18 with EF 40-45%, septal-lateral dyssynchrony.(Improved from 25-30% 06/2016) Has PICC placed.  ECHO ordered.  Holding torsemide, losartan, digoxin, bisoprolol and spironolactone.  Cardiology managing.  plan to discharge on torsemide on as needed basis. He was advised t take torsemide 20 mg daily if his weight increase 2-3 pounds.  I wont resume lisinopril yet at discharge due to potasium been on normal high  side. He should follow early next week t have B-met check.   COPD Stable.    CAD S/P CABG -Cath 06/10/16 with severe 3 vessel native disease with patent bypass grafts. No interventional target. -continue with statins.   DM; A1c; 9 Continue with latus. Increase lantus to 56 units.  Resume home regimen. Wont change home regimen, patient saw his endocrinologist last week and was advised to continue with same regimen, patient had couple of hypoglycemia episode at home. He is suppose to check blood sugar every 4 hours at home.   Bradycardia; digoxin, bisoprolol stopped.  HR improved  Hyperkalemia corrected.    Discharge Diagnoses:  Principal Problem:   Acute renal failure superimposed on stage 3 chronic kidney disease (HCC) Active Problems:   HTN (hypertension), benign   Tobacco dependence   Hyperlipidemia   PAF (paroxysmal atrial fibrillation) (HCC)   Diabetes mellitus with complication (HCC)   Chronic diastolic CHF (congestive heart failure) (HCC)   Medication side effect   Symptomatic bradycardia    Discharge Instructions  Discharge Instructions    Diet - low sodium heart healthy   Complete by:  As directed    Increase activity slowly   Complete by:  As directed      Allergies as of 10/03/2017      Reactions   Morphine And Related Other (See Comments)   Drenched with perspiration   Entresto [sacubitril-valsartan]    hypotension   Demerol [meperidine] Nausea Only   Starlix [nateglinide] Other (See Comments)   gassy      Medication List    STOP taking these medications  bisoprolol 5 MG tablet Commonly known as:  ZEBETA   lisinopril 10 MG tablet Commonly known as:  PRINIVIL,ZESTRIL   spironolactone 25 MG tablet Commonly known as:  ALDACTONE     TAKE these medications   ACCU-CHEK GUIDE test strip Generic drug:  glucose blood USE TO CHECK BLOOD SUGAR 3 TIMES DAILY   acetaminophen 325 MG tablet Commonly known as:  TYLENOL Take 2 tablets (650 mg  total) by mouth every 4 (four) hours as needed for headache or mild pain.   atorvastatin 40 MG tablet Commonly known as:  LIPITOR Take 40 mg by mouth daily at 6 PM.   fluticasone 50 MCG/ACT nasal spray Commonly known as:  FLONASE Place 2 sprays into both nostrils daily. What changed:    when to take this  reasons to take this   gabapentin 100 MG capsule Commonly known as:  NEURONTIN TAKE TWO CAPSULES BY MOUTH THREE TIMES DAILY What changed:  when to take this   guaiFENesin 600 MG 12 hr tablet Commonly known as:  MUCINEX Take 2 tablets (1,200 mg total) by mouth 2 (two) times daily.   hyoscyamine 0.125 MG SL tablet Commonly known as:  LEVSIN SL TAKE 1 TO 2 TABLETS BY MOUTH EVERY 6 HOURS AS NEEDED FOR DIARRHEA What changed:    how much to take  how to take this  when to take this  reasons to take this  additional instructions   Icosapent Ethyl 1 g Caps 2 capsules twice a day What changed:    how much to take  how to take this  when to take this  additional instructions   insulin regular human CONCENTRATED 500 UNIT/ML injection Commonly known as:  HUMULIN R INJECT 56 UNITS SUBCUTANEOUSLY DAILY IN INSULIN PUMP   isosorbide mononitrate 30 MG 24 hr tablet Commonly known as:  IMDUR TAKE ONE TABLET BY MOUTH EVERY DAY   lipase/protease/amylase 36000 UNITS Cpep capsule Commonly known as:  CREON Take 2 capsules (72,000 Units total) by mouth 3 (three) times daily with meals. What changed:  when to take this   mirtazapine 45 MG tablet Commonly known as:  REMERON Take 1 tablet (45 mg total) by mouth at bedtime.   multivitamin with minerals Tabs tablet Take 1 tablet by mouth daily.   nitroGLYCERIN 0.4 MG SL tablet Commonly known as:  NITROSTAT PLACE 1 TABLET UNDER THE TONGUE EVERY 5 MINUTES AS NEEDED FOR CHEST PAIN What changed:  See the new instructions.   PROAIR HFA 108 (90 Base) MCG/ACT inhaler Generic drug:  albuterol INHALE 2 PUFFS INTO THE LUNGS  EVERY 4 HOURS AS NEEDED FOR WHEEZING ORSHORTNESS OF BREATH What changed:  See the new instructions.   rivaroxaban 20 MG Tabs tablet Commonly known as:  XARELTO Take 1 tablet (20 mg total) by mouth daily with supper.   sertraline 25 MG tablet Commonly known as:  ZOLOFT TAKE TWO TABLETS BY MOUTH EVERY DAY What changed:  when to take this   SYMBICORT 160-4.5 MCG/ACT inhaler Generic drug:  budesonide-formoterol INHALE 2 PUFFS INTO THE LUNGS TWICE DAILY   SYNJARDY 05-998 MG Tabs Generic drug:  Empagliflozin-metFORMIN HCl TAKE ONE TABLET BY MOUTH TWICE DAILY AFTER A MEAL What changed:  See the new instructions.   tamsulosin 0.4 MG Caps capsule Commonly known as:  FLOMAX TAKE 1 CAPSULE BY MOUTH EVERY DAY AFTER SUPPER What changed:    how much to take  how to take this  when to take this  additional instructions  torsemide 20 MG tablet Commonly known as:  DEMADEX Take 20 mg daily as needed for increase weight of 2 to 3 pounds in 24 hours. What changed:    how much to take  how to take this  when to take this  additional instructions   V-GO 20 Kit Apply one Vgo 20 pump daily.   vitamin C 1000 MG tablet Take 1,000 mg by mouth daily.   Vitamin D-3 1000 units Caps Take 1,000 Units by mouth daily. Reported on 02/26/2015      Follow-up Information    McGowen, Adrian Blackwater, MD Follow up in 1 week(s).   Specialty:  Family Medicine Contact information: [id number] [place] [address] [phone]        Larey Dresser, MD Follow up in 3 day(s).   Specialty:  Cardiology Why:  lab work 10-04  Contact information: [place]. [address] [place] [address] [phone]          Allergies  Allergen Reactions  . Morphine And Related Other (See Comments)    Drenched with perspiration  . Entresto [Sacubitril-Valsartan]     hypotension  . Demerol [Meperidine] Nausea Only  . Starlix [Nateglinide] Other (See Comments)    gassy     Consultations:  Cardiology    Procedures/Studies: Dg Chest Port 1 View  Result Date: 10/01/2017 CLINICAL DATA:  PICC placement. EXAM: PORTABLE CHEST 1 VIEW COMPARISON:  Radiograph of September 30, 2017. FINDINGS: The heart size and mediastinal contours are within normal limits. Status post coronary bypass graft. No pneumothorax or pleural effusion is noted. Atherosclerosis of thoracic aorta is noted. Interval placement of right-sided PICC line with distal tip in expected position of cavoatrial junction. Both lungs are clear. The visualized skeletal structures are unremarkable. IMPRESSION: Interval placement of right-sided PICC line with distal tip in expected position of cavoatrial junction. No acute cardiopulmonary abnormality seen. Aortic Atherosclerosis (ICD10-I70.0). Electronically Signed   By: Marijo Conception, M.D.   On: 10/01/2017 13:43   Dg Chest Portable 1 View  Result Date: 09/30/2017 CLINICAL DATA:  Weakness and shortness of breath with exertion for the past 2-3 weeks. EXAM: PORTABLE CHEST 1 VIEW COMPARISON:  08/27/2016. Chest CT dated 06/02/2016. FINDINGS: Normal sized heart. Stable post CABG changes. Previously demonstrated calcified pleural plaques. Stable mildly prominent interstitial markings. Thoracic spine degenerative changes and mild bilateral shoulder degenerative changes. IMPRESSION: 1. No acute abnormality. 2. Stable mild chronic interstitial lung disease. 3. Previously demonstrated bilateral calcified pleural plaques compatible with previous asbestos exposure. Electronically Signed   By: Claudie Revering M.D.   On: 09/30/2017 13:28   [place] Ekg Site Rite  Result Date: 09/30/2017 If Site Rite image not attached, placement could not be confirmed due to current cardiac rhythm.    Subjective: Feeling better, stronger.    Discharge Exam: Vitals:   10/03/17 0521 10/03/17 0739  BP: (!) 140/49 (!) 140/50  Pulse: (!) 59 60  Resp: 18 18  Temp: 97.9 F (36.6 C) 98 F (36.7  C)  SpO2: 90% 92%   Vitals:   10/02/17 1510 10/02/17 1920 10/03/17 0521 10/03/17 0739  BP: 140/80 (!) 153/55 (!) 140/49 (!) 140/50  Pulse: (!) 58 (!) 59 (!) 59 60  Resp:  _0 Temp:  97.9 F (36.6 C) 97.9 F (36.6 C) 98 F (36.7 C)  TempSrc:  Oral Oral Oral  SpO2: 98% 92% 90% 92%  Weight:   98.4 kg   Height:  General: Pt is alert, awake, not in acute distress Cardiovascular: RRR, S1/S2 +, no rubs, no gallops Respiratory: CTA bilaterally, no wheezing, no rhonchi Abdominal: Soft, NT, ND, bowel sounds + Extremities: no edema, no cyanosis    The results of significant diagnostics from this hospitalization (including imaging, microbiology, ancillary and laboratory) are listed below for reference.     Microbiology: Recent Results (from the past 240 hour(s))  Urine culture     Status: None   Collection Time: 09/30/17  1:06 PM  Result Value Ref Range Status   Specimen Description URINE, RANDOM  Final   Special Requests NONE  Final   Culture   Final    NO GROWTH Performed at [facility], [address], [place], [place] [address]    Report Status 10/01/2017 FINAL  Final  MRSA PCR Screening     Status: None   Collection Time: 09/30/17  5:40 PM  Result Value Ref Range Status   MRSA by PCR NEGATIVE NEGATIVE Final    Comment:        The GeneXpert MRSA Assay (FDA approved for NASAL specimens only), is one component of a comprehensive MRSA colonization surveillance program. It is not intended to diagnose MRSA infection nor to guide or monitor treatment for MRSA infections. Performed at [facility], [place] [address], [place], [place] [address]      Labs: BNP (last 3 results) Recent Labs    09/30/17 1237  BNP 505.3*   Basic Metabolic Panel: Recent Labs  Lab 09/30/17 1237 09/30/17 1247 10/01/17 0002 10/02/17 0447 10/03/17 0357  NA 137 136 139 138 139  K 5.8* 5.6* 5.5* 5.5* 5.1  CL 111 110 110 111 111  CO2 15*  --  17* 20* 22  GLUCOSE  117* 114* 275* 304* 311*  BUN 96* 114* 85* 53* 37*  CREATININE 1.98* 2.10* 1.65* 1.21 1.08  CALCIUM 9.0  --  9.3 9.4 9.1   Liver Function Tests: Recent Labs  Lab 09/30/17 1237  AST 18  ALT 16  ALKPHOS 63  BILITOT 0.4  PROT 6.7  ALBUMIN 3.7   No results for input(s): LIPASE, AMYLASE in the last 168 hours. No results for input(s): AMMONIA in the last 168 hours. CBC: Recent Labs  Lab 09/30/17 1237 09/30/17 1247 10/01/17 0002  WBC 9.2  --  9.1  NEUTROABS 6.6  --   --   HGB 15.4 15.6 14.3  HCT 48.7 46.0 44.8  MCV 87.1  --  86.5  PLT 163  --  148*   Cardiac Enzymes: Recent Labs  Lab 09/30/17 2018 10/01/17 0002 10/01/17 0530  TROPONINI 0.20* 0.18* 0.17*   BNP: Invalid input(s): POCBNP CBG: Recent Labs  Lab 10/02/17 1149 10/02/17 1611 10/02/17 2133 10/03/17 0802 10/03/17 1203  GLUCAP 370* 255* 308* 373* 344*   D-Dimer No results for input(s): DDIMER in the last 72 hours. Hgb A1c Recent Labs    10/01/17 1453  HGBA1C 9.0*   Lipid Profile No results for input(s): CHOL, HDL, LDLCALC, TRIG, CHOLHDL, LDLDIRECT in the last 72 hours. Thyroid function studies No results for input(s): TSH, T4TOTAL, T3FREE, THYROIDAB in the last 72 hours.  Invalid input(s): FREET3 Anemia work up No results for input(s): VITAMINB12, FOLATE, FERRITIN, TIBC, IRON, RETICCTPCT in the last 72 hours. Urinalysis    Component Value Date/Time   COLORURINE YELLOW 09/30/2017 [place] 09/30/2017 1235   LABSPEC 1.008 09/30/2017 1235   PHURINE 5.0 09/30/2017 1235   GLUCOSEU  NEGATIVE 09/30/2017 [place] 09/30/2017 [place] 09/30/2017 1235   [place] 09/30/2017 1235   PROTEINUR NEGATIVE 09/30/2017 1235   UROBILINOGEN 0.2 08/11/2013 1624   NITRITE NEGATIVE 09/30/2017 1235   LEUKOCYTESUR NEGATIVE 09/30/2017 1235   Sepsis Labs Invalid input(s): PROCALCITONIN,  WBC,  LACTICIDVEN Microbiology Recent Results (from the past 240  hour(s))  Urine culture     Status: None   Collection Time: 09/30/17  1:06 PM  Result Value Ref Range Status   Specimen Description URINE, RANDOM  Final   Special Requests NONE  Final   Culture   Final    NO GROWTH Performed at [facility], [place] [address] [place], [place] [address]    Report Status 10/01/2017 FINAL  Final  MRSA PCR Screening     Status: None   Collection Time: 09/30/17  5:40 PM  Result Value Ref Range Status   MRSA by PCR NEGATIVE NEGATIVE Final    Comment:        The GeneXpert MRSA Assay (FDA approved for NASAL specimens only), is one component of a comprehensive MRSA colonization surveillance program. It is not intended to diagnose MRSA infection nor to guide or monitor treatment for MRSA infections. Performed at [facility], [place] [address], [place], [place] [address]      Time coordinating discharge: 35 minutes,   SIGNED:   Elmarie Shiley, MD  [facility] 10/03/2017, 3:18 PM Pager   If 7PM-7AM, please contact night-coverage [email or web address] Password [email or web address]

## 2017-10-04 ENCOUNTER — Telehealth: Payer: Self-pay

## 2017-10-04 NOTE — Telephone Encounter (Signed)
He needs 2-week follow-up only if he is having problems with his blood sugar control and if needed can see Dr. Chauncey Cruel

## 2017-10-04 NOTE — Telephone Encounter (Signed)
Patient needs hospital f/u appointment in 2 weeks but your schedule is full ok to schedule with [facility] please advise

## 2017-10-05 ENCOUNTER — Ambulatory Visit (HOSPITAL_COMMUNITY)
Admission: RE | Admit: 2017-10-05 | Discharge: 2017-10-05 | Disposition: A | Discharge status: Home / Self Care | Payer: Medicare Other | Source: Ambulatory Visit | Attending: Cardiology | Admitting: Cardiology

## 2017-10-05 ENCOUNTER — Telehealth (HOSPITAL_COMMUNITY): Payer: Self-pay | Admitting: Cardiology

## 2017-10-05 ENCOUNTER — Telehealth: Payer: Self-pay | Admitting: Emergency Medicine

## 2017-10-05 VITALS — BP 158/60 | HR 89 | Wt 221.2 lb

## 2017-10-05 DIAGNOSIS — Z833 Family history of diabetes mellitus: Secondary | ICD-10-CM | POA: Insufficient documentation

## 2017-10-05 DIAGNOSIS — I110 Hypertensive heart disease with heart failure: Secondary | ICD-10-CM | POA: Diagnosis not present

## 2017-10-05 DIAGNOSIS — N181 Chronic kidney disease, stage 1: Secondary | ICD-10-CM | POA: Diagnosis not present

## 2017-10-05 DIAGNOSIS — I255 Ischemic cardiomyopathy: Secondary | ICD-10-CM | POA: Insufficient documentation

## 2017-10-05 DIAGNOSIS — Z951 Presence of aortocoronary bypass graft: Secondary | ICD-10-CM | POA: Insufficient documentation

## 2017-10-05 DIAGNOSIS — I2510 Atherosclerotic heart disease of native coronary artery without angina pectoris: Secondary | ICD-10-CM | POA: Insufficient documentation

## 2017-10-05 DIAGNOSIS — I480 Paroxysmal atrial fibrillation: Secondary | ICD-10-CM | POA: Insufficient documentation

## 2017-10-05 DIAGNOSIS — I5022 Chronic systolic (congestive) heart failure: Secondary | ICD-10-CM

## 2017-10-05 DIAGNOSIS — I5032 Chronic diastolic (congestive) heart failure: Secondary | ICD-10-CM

## 2017-10-05 DIAGNOSIS — I5040 Unspecified combined systolic (congestive) and diastolic (congestive) heart failure: Secondary | ICD-10-CM | POA: Insufficient documentation

## 2017-10-05 DIAGNOSIS — Z885 Allergy status to narcotic agent status: Secondary | ICD-10-CM | POA: Diagnosis not present

## 2017-10-05 DIAGNOSIS — I447 Left bundle-branch block, unspecified: Secondary | ICD-10-CM | POA: Insufficient documentation

## 2017-10-05 DIAGNOSIS — Z7901 Long term (current) use of anticoagulants: Secondary | ICD-10-CM | POA: Insufficient documentation

## 2017-10-05 DIAGNOSIS — F17210 Nicotine dependence, cigarettes, uncomplicated: Secondary | ICD-10-CM | POA: Diagnosis not present

## 2017-10-05 DIAGNOSIS — Z8249 Family history of ischemic heart disease and other diseases of the circulatory system: Secondary | ICD-10-CM | POA: Diagnosis not present

## 2017-10-05 DIAGNOSIS — J449 Chronic obstructive pulmonary disease, unspecified: Secondary | ICD-10-CM | POA: Insufficient documentation

## 2017-10-05 DIAGNOSIS — E875 Hyperkalemia: Secondary | ICD-10-CM | POA: Diagnosis not present

## 2017-10-05 DIAGNOSIS — Z79899 Other long term (current) drug therapy: Secondary | ICD-10-CM | POA: Diagnosis not present

## 2017-10-05 DIAGNOSIS — I10 Essential (primary) hypertension: Secondary | ICD-10-CM

## 2017-10-05 DIAGNOSIS — E119 Type 2 diabetes mellitus without complications: Secondary | ICD-10-CM | POA: Insufficient documentation

## 2017-10-05 DIAGNOSIS — Z794 Long term (current) use of insulin: Secondary | ICD-10-CM | POA: Insufficient documentation

## 2017-10-05 DIAGNOSIS — E785 Hyperlipidemia, unspecified: Secondary | ICD-10-CM | POA: Diagnosis not present

## 2017-10-05 DIAGNOSIS — Z7951 Long term (current) use of inhaled steroids: Secondary | ICD-10-CM | POA: Diagnosis not present

## 2017-10-05 LAB — BASIC METABOLIC PANEL
ANION GAP: 8 (ref 5–15)
BUN: 23 mg/dL (ref 8–23)
CO2: 21 mmol/L — AB (ref 22–32)
Calcium: 9.2 mg/dL (ref 8.9–10.3)
Chloride: 110 mmol/L (ref 98–111)
Creatinine, Ser: 0.9 mg/dL (ref 0.61–1.24)
GFR calc Af Amer: >60 mL/min (ref >60)
GFR calc non Af Amer: >60 mL/min (ref >60)
GLUCOSE: 187 mg/dL — AB (ref 70–99)
POTASSIUM: 5.9 mmol/L — AB (ref 3.5–5.1)
Sodium: 139 mmol/L (ref 135–145)

## 2017-10-05 LAB — CBC
HEMATOCRIT: 46.4 % (ref 39.0–52.0)
HEMOGLOBIN: 14.8 g/dL (ref 13.0–17.0)
MCH: 28 pg (ref 26.0–34.0)
MCHC: 31.9 g/dL (ref 30.0–36.0)
MCV: 87.7 fL (ref 78.0–100.0)
Platelets: 168 10*3/uL (ref 150–400)
RBC: 5.29 MIL/uL (ref 4.22–5.81)
RDW: 16 % — ABNORMAL HIGH (ref 11.5–15.5)
WBC: 10.8 10*3/uL — ABNORMAL HIGH (ref 4.0–10.5)

## 2017-10-05 MED ORDER — BISOPROLOL FUMARATE 5 MG PO TABS: 2.5000 mg | ORAL_TABLET | Freq: Every day | ORAL | 11 refills | Status: DC

## 2017-10-05 NOTE — Telephone Encounter (Signed)
Transition Care Management Follow-up Telephone Call   Date discharged? 10/03/2017  How have you been since you were released from the hospital? He's been doing ok per his wife, she states " his potassium is back up" has Follow-Up with Dr. Marcial Pacas office on Friday.   Do you understand why you were in the hospital? yes   Do you understand the discharge instructions? yes   Where were you discharged to? Home    Items Reviewed:  Medications reviewed: yes  Allergies reviewed: yes  Dietary changes reviewed: yes  Referrals reviewed: yes   Functional Questionnaire:   Activities of Daily Living (ADLs):   He states they are independent in the following: ambulation, bathing and hygiene, feeding, continence, grooming, toileting and dressing States they require assistance with the following:none   Any transportation issues/concerns?: yes   Any patient concerns? Patient's wife is concerned about his Kidney's    Confirmed importance and date/time of follow-up visits scheduled yes  Provider Appointment booked with Dr. Anitra Lauth on 10/15/2017 at 11:00am   Confirmed with patient if condition begins to worsen call PCP or go to the ER.  Patient was given the office number and encouraged to call back with question or concerns.  : yes

## 2017-10-05 NOTE — Telephone Encounter (Signed)
-----  Message from Conrad Buffalo, NP sent at 10/05/2017  3:43 PM EDT ----- Please call. Potassium 5.9 .  Needs to take lokelma 10 grams daily for 2 days. Take 20 mg torsemide today. Please ask him to come to and pick up lokelma in the morning. Needs BMET on Friday.

## 2017-10-05 NOTE — Patient Instructions (Signed)
START Bisoprolol 2.5 mg, one half tab daily  Labs today We will only contact you if something comes back abnormal or we need to make some changes. Otherwise no news is good news!  Your physician recommends that you schedule a follow-up appointment in: 2-3 weeks  in the [facility]   Do the following things EVERYDAY: 1) Weigh yourself in the morning before breakfast. Write it down and keep it in a log. 2) Take your medicines as prescribed 3) Eat low salt foods-Limit salt (sodium) to 2000 mg per day.  4) Stay as active as you can everyday 5) Limit all fluids for the day to less than 2 liters

## 2017-10-05 NOTE — Progress Notes (Signed)
Advanced Heart Failure Clinic Note   Primary Care: Dr. Anitra Lauth Primary Cardiologist: Dr. [name]  HPI:  Joshua [name] is a 77 y.o. 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 Unable to tolerate Entresto due to lightheadedness/hypotension.   Admitted 5/31 -> 06/11/16 with worsening SOB, volume overload, and increased 02 demand. Echo showed EF down to 25-30%.  Diuresed on IV lasix and given solumedrol. Thought to be mixed AECOPD and acute CHF. Moved to stepdown 06/05/16 with worsening resp status and BiPAP requirement. Improved with nebs, steroids, ABX and IV lasix + metolazone. Sent home on [place] and prednisone taper. Overall diuresed 22 lbs. He had left and right heart cath with patent grafts and no interventional target.  Discharge weight 233 lbs.   Pt admitted from 6/11 -> 06/17/16 with syncope (unwitnessed) Noted to have fall x 3. EKG with LBBB. Creatinine mildly elevated. CT head negative for acute process. R hip xray negative for fracture or dislocated hardware.  Felt to be vasovagal. Received NS and diuretics held up to discharge.  Creatinine improved.  Admitted 09/30/17 with weakness and bradycardia. Had AKI and hyperkalemia. Bisoprolol, lisinorpil, digoxin, and spironolactone stopped. ECHO completed and showed EF 50-55% with grade IDD. Discharged on 09/30/2017 with weight 216 pounds.   Today he returns for post hospital follow up. Overall feeling ok. Says he is getting a little stronger. Denies SOB/PND/Orthopnea. Walking around with a rolling walker. Appetite ok. No fever or chills. Weight at home has gone up a few pounds since discharge. Last weight 218 pounds. Taking all medications.   Labs (8/18): K 4.7, creatinine 1.05 Labs (9/18): K 4.3, creatinine 1.44 Labs (10/18): K 4.9, creatinine 0.99 Labs (11/18): K 4.5, creatinine 1.32, digoxin 0.3 Labs (4/19): TGs 397, LDL 47, K 4.4, creatinine 1.01   PMH:  1. CAD: s/p CABG in 8/15.  - LHC  (6/18) with 80% distal LM, TO pLAD, TO ostial LCx, subtotal occlusion distal RCA.  LIMA-LAD patent, Y graft to OM and D patent (50% stenosis in graft at touchdown on OM), patent SVG-PDA.  2. Chronic systolic CHF: Ischemic cardiomyopathy.  Echo (6/18) with EF 25-30%, diffuse HK, moderate MR, mildly dilated RV with mildly decreased systolic function, PASP 48 mmHg.  - RHC (6/18): mean RA 10, PA 45/26, mean PCWP 21, CI 2.11 (Fick) and 2.41 (thermo), PVR 2.9 WU.  - Echo (9/18): EF 40-45%, septal-lateral dyssynchrony, diffuse hypokinesis, normal RV size and systolic function.  3. COPD: Prior smoker.   4. Hyperlipidemia 5. Atrial fibrillation: Paroxysmal.  6. Type II diabetes 7. HTN 8. Anemia: h/o Fe deficiency 9. 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  10. H/o pericardial effusion with tamponade in 6/15.  11. HTN 12. LBBB  Review of systems complete and found to be negative unless listed in HPI.     Current Outpatient Medications  Medication Sig Dispense Refill  . ACCU-CHEK GUIDE test strip USE TO CHECK BLOOD SUGAR 3 TIMES DAILY 100 each 11  . acetaminophen (TYLENOL) 325 MG tablet Take 2 tablets (650 mg total) by mouth every 4 (four) hours as needed for headache or mild pain.    . Ascorbic Acid (VITAMIN C) 1000 MG tablet Take 1,000 mg by mouth daily.    [name] atorvastatin (LIPITOR) 40 MG tablet Take 40 mg by mouth daily at 6 PM.     . Cholecalciferol (VITAMIN D-3) 1000 units CAPS Take 1,000 Units by mouth daily. Reported  on 02/26/2015    . fluticasone (FLONASE) 50 MCG/ACT nasal spray Place 2 sprays into both nostrils daily. (Patient taking differently: Place 2 sprays into both nostrils as needed. ) 16 g 1  . gabapentin (NEURONTIN) 100 MG capsule TAKE TWO CAPSULES BY MOUTH THREE TIMES DAILY (Patient taking differently: Take 200 mg by mouth 2 (two) times daily. ) 180 capsule 11  . guaiFENesin (MUCINEX) 600 MG 12 hr tablet Take 2 tablets (1,200 mg total) by mouth 2  (two) times daily. 30 tablet 0  . hyoscyamine (LEVSIN SL) 0.125 MG SL tablet TAKE 1 TO 2 TABLETS BY MOUTH EVERY 6 HOURS AS NEEDED FOR DIARRHEA (Patient taking differently: Take 0.125-0.25 mg by mouth as needed for cramping. AS NEEDED FOR DIARRHEA) 60 tablet 3  . Icosapent Ethyl (VASCEPA) 1 g CAPS 2 capsules twice a day (Patient taking differently: Take 2 g by mouth 2 (two) times daily. ) 120 capsule 2  . Insulin Disposable Pump (V-GO 20) KIT Apply one Vgo 20 pump daily. 30 kit 3  . insulin regular human CONCENTRATED (HUMULIN R) 500 UNIT/ML injection INJECT 56 UNITS SUBCUTANEOUSLY DAILY IN INSULIN PUMP 20 mL 0  . isosorbide mononitrate (IMDUR) 30 MG 24 hr tablet TAKE ONE TABLET BY MOUTH EVERY DAY (Patient taking differently: Take 30 mg by mouth daily. ) 90 tablet 1  . lipase/protease/amylase (CREON) 36000 UNITS CPEP capsule Take 2 capsules (72,000 Units total) by mouth 3 (three) times daily with meals. (Patient taking differently: Take 72,000 Units by mouth daily with breakfast. ) 48 capsule 0  . mirtazapine (REMERON) 45 MG tablet Take 1 tablet (45 mg total) by mouth at bedtime. 30 tablet 5  . Multiple Vitamin (MULTIVITAMIN WITH MINERALS) TABS Take 1 tablet by mouth daily.    . nitroGLYCERIN (NITROSTAT) 0.4 MG SL tablet PLACE 1 TABLET UNDER THE TONGUE EVERY 5 MINUTES AS NEEDED FOR CHEST PAIN (Patient taking differently: Place 0.4 mg under the tongue every 5 (five) minutes as needed for chest pain. ) 25 tablet 3  . PROAIR HFA 108 (90 Base) MCG/ACT inhaler INHALE 2 PUFFS INTO THE LUNGS EVERY 4 HOURS AS NEEDED FOR WHEEZING ORSHORTNESS OF BREATH (Patient taking differently: Inhale 2 puffs into the lungs every 4 (four) hours as needed for wheezing or shortness of breath. ) 8.5 g 1  . rivaroxaban (XARELTO) 20 MG TABS tablet Take 1 tablet (20 mg total) by mouth daily with supper. 30 tablet 11  . sertraline (ZOLOFT) 25 MG tablet TAKE TWO TABLETS BY MOUTH EVERY DAY (Patient taking differently: Take 50 mg by mouth  at bedtime. ) 60 tablet 3  . SYMBICORT 160-4.5 MCG/ACT inhaler INHALE 2 PUFFS INTO THE LUNGS TWICE DAILY (Patient taking differently: Inhale 2 puffs into the lungs 2 (two) times daily. ) 10.2 g 5  . SYNJARDY 05-998 MG TABS TAKE ONE TABLET BY MOUTH TWICE DAILY AFTER A MEAL (Patient taking differently: Take 1 tablet by mouth 2 (two) times daily. ) 60 tablet 2  . tamsulosin (FLOMAX) 0.4 MG CAPS capsule TAKE 1 CAPSULE BY MOUTH EVERY DAY AFTER SUPPER (Patient taking differently: Take 0.4 mg by mouth daily after supper. ) 90 capsule 1  . torsemide (DEMADEX) 20 MG tablet Take 20 mg daily as needed for increase weight of 2 to 3 pounds in 24 hours. 60 tablet 6   No current facility-administered medications for this encounter.     Allergies  Allergen Reactions  . Morphine And Related Other (See Comments)    Drenched with  perspiration  . Entresto [Sacubitril-Valsartan]     hypotension  . Demerol [Meperidine] Nausea Only  . Starlix [Nateglinide] Other (See Comments)    gassy      Social History   Socioeconomic History  . Marital status: Married    Spouse name: [name]  . Number of children: 4  . Years of education: Not on file  . Highest education level: Not on file  Occupational History  . Occupation: retired, former Scientist, forensic  Social Needs  . Financial resource strain: Not on file  . Food insecurity:    Worry: Not on file    Inability: Not on file  . Transportation needs:    Medical: Not on file    Non-medical: Not on file  Tobacco Use  . Smoking status: Current Every Day Smoker    Packs/day: 1.00    Years: 64.00    Pack years: 64.00    Types: Cigarettes    Last attempt to quit: 10/23/2015    Years since quitting: 1.9  . Smokeless tobacco: Former Systems developer    Quit date: 06/30/2013  Substance and Sexual Activity  . Alcohol use: No    Alcohol/week: 0.0 standard drinks  . Drug use: No  . Sexual activity: Never  Lifestyle  . Physical activity:    Days per week: Not on file     Minutes per session: Not on file  . Stress: Not on file  Relationships  . Social connections:    Talks on phone: Not on file    Gets together: Not on file    Attends religious service: Not on file    Active member of club or organization: Not on file    Attends meetings of clubs or organizations: Not on file    Relationship status: Not on file  . Intimate partner violence:    Fear of current or ex partner: Not on file    Emotionally abused: Not on file    Physically abused: Not on file    Forced sexual activity: Not on file  Other Topics Concern  . Not on file  Social History Narrative   Married, 4 children.   Formerly a Medical illustrator.   Level of education: HS.     +tobacco--lifelong/ quit 2015).  No alcohol or drugs.   No exercise.  +excessive caffeine.      Lives in 1 story home with his wife and her sister      Family History  Problem Relation Age of Onset  . Heart disease Father   . Heart attack Father   . CVA Mother   . Hypertension Mother   . Diabetes Paternal Grandmother   . Breast cancer Sister   . Diabetes Maternal Uncle   . Heart disease Maternal Uncle   . Stroke Neg Hx     Vitals:   10/05/17 0938  BP: (!) 158/60  Pulse: 89  SpO2: 95%  Weight: 100.3 kg (221 lb 3.2 oz)   Wt Readings from Last 3 Encounters:  10/05/17 100.3 kg (221 lb 3.2 oz)  10/03/17 98.4 kg (216 lb 14.9 oz)  09/29/17 101.6 kg (224 lb)     PHYSICAL EXAM: General:  Elderly. No resp difficulty HEENT: normal Neck: supple. JVP 7-8 . Carotids 2+ bilat; no bruits. No lymphadenopathy or thryomegaly appreciated. Cor: PMI nondisplaced. Regular rate & rhythm. No rubs, gallops or murmurs. Lungs: clear Abdomen: soft, nontender, nondistended. No hepatosplenomegaly. No bruits or masses. Good bowel sounds. Extremities: no cyanosis, clubbing, rash,  edema Neuro: alert & orientedx3, cranial nerves grossly intact. moves all 4 extremities w/o difficulty. Affect pleasant  EKG: NSR  77 bpm LBBB  ASSESSMENT & PLAN: 1. Combined Diastolic/Systolic CHF: Ischemic cardiomyopathy.  ECHO EF 50-55% with Grade I DD.  He has been off torsemide since discharge.  For now continue torsemide 20 mg as needed.  - Restart 2.5 mg bisoprolol daily.  - Check BMET  Does not need dig.  No arb/spiro with hyperkalemia. 2. COPD: No longer smoking.  Not using home oxygen at this time.  3. CAD s/p CABG: Cath 06/10/16 with severe 3 vessel native disease with patent bypass grafts. No interventional target.  - Continue statin.  With stable CAD and Xarelto use, does not need to be on ASA.  No chest pain.  4. Atrial fibrillation: Paroxysmal.  - Maintaining NSR.   - Continue Xarelto.  5. Type II diabetes:  6. Hyperkalemia Check BMET  7. HTN Elevated. Restart bisoprolol.   Follow up in 2-3 weeks.  Coren Sagan NP-C  3:49 PM  I reviewed BMEt from today . K 5.9 No hemolysis noted. Creatinine 0.9.  We will call and start 10 grams lokelma daily for 2 days then repeat BMET on Friday He will also take 20 mg torsemide today.   Darrick Grinder, NP 10/05/17

## 2017-10-05 NOTE — Telephone Encounter (Signed)
Can you schedule pt to see Dr Maretta Bees please

## 2017-10-05 NOTE — Telephone Encounter (Signed)
Medication Samples have been provided to the patient.  Drug name: Lokelma       Strength: 10 mg        Qty: 2 packets  LOT: [id number]  Exp.Date: 11/05/2019  Dosing instructions: dissolve 1 packet daily  X 2 days in 3-5 tablespoons of water Lokelma must be taken separate/ALONE of all other medications, and you must wait 2 hours before/after  taking any other medications.  The patient has been instructed regarding the correct time, dose, and frequency of taking this medication, including desired effects and most common side effects.   Garlan Fair M 4:03 PM 10/05/2017     Notes recorded by Kerry Dory, CMA on 10/05/2017 at 4:01 PM EDT Patient aware. Instructions given to wife, reports they are unable to return to [facility] today will be sure to swing by first thing in the AM.lokelma samples left in the front office, stressed the importance of taking 20 mg of torsemide tonight, repeat bmet 10/4  ------  Notes recorded by Conrad [place], NP on 10/05/2017 at 3:43 PM EDT Please call. Potassium 5.9 . Needs to take lokelma 10 grams daily for 2 days. Take 20 mg torsemide today. Please ask him to come to and pick up lokelma in the morning. Needs BMET on Friday.

## 2017-10-05 NOTE — Telephone Encounter (Signed)
LM to complete TCM, call back and ask to speak with Turkessa Ostrom

## 2017-10-06 NOTE — Telephone Encounter (Signed)
Please advise re: pt blood sugar control

## 2017-10-06 NOTE — Telephone Encounter (Signed)
Patient needs to be called to get blood sugar readings per MD in note below

## 2017-10-06 NOTE — Telephone Encounter (Signed)
Noted: nurse phone contact with patient for TCM. Signed:  Crissie Sickles, MD           10/06/2017

## 2017-10-07 ENCOUNTER — Telehealth: Payer: Self-pay | Admitting: Endocrinology

## 2017-10-07 NOTE — Telephone Encounter (Signed)
Wife is aware and states she will bring sugar readings with her to his appointment

## 2017-10-07 NOTE — Telephone Encounter (Signed)
Patient's wife is requesting a call back. She would like to know what his blood sugar ranges should be.

## 2017-10-07 NOTE — Telephone Encounter (Signed)
Spoke to [employer] pts wife, which per DPR is allowed by pt, and she stated his blood sugars are up in down. In message below Dr. Dwyane Dee states if he was having blood sugar control to schedule him with Dr. Chauncey Cruel. Sent call to front desk to schedule.

## 2017-10-07 NOTE — Telephone Encounter (Signed)
Fasting blood sugar range 80-130 and after meals 120-180

## 2017-10-07 NOTE — Telephone Encounter (Signed)
I reviewed his chart and it does not give a range for sugars, and where you would like to see it. Please advise. He does have a followup with Dr Chauncey Cruel scheduled to assist with sugar control per previous message with you since his hospital release.

## 2017-10-08 ENCOUNTER — Telehealth (HOSPITAL_COMMUNITY): Payer: Self-pay | Admitting: Cardiology

## 2017-10-08 ENCOUNTER — Ambulatory Visit (HOSPITAL_COMMUNITY)
Admission: RE | Admit: 2017-10-08 | Discharge: 2017-10-08 | Disposition: A | Discharge status: Home / Self Care | Payer: Medicare Other | Source: Ambulatory Visit | Attending: Cardiology | Admitting: Cardiology

## 2017-10-08 ENCOUNTER — Other Ambulatory Visit: Payer: Self-pay | Admitting: Family Medicine

## 2017-10-08 ENCOUNTER — Telehealth (HOSPITAL_COMMUNITY): Payer: Self-pay | Admitting: Pharmacist

## 2017-10-08 DIAGNOSIS — I5043 Acute on chronic combined systolic (congestive) and diastolic (congestive) heart failure: Secondary | ICD-10-CM | POA: Diagnosis not present

## 2017-10-08 LAB — BASIC METABOLIC PANEL
Anion gap: 4 — ABNORMAL LOW (ref 5–15)
BUN: 16 mg/dL (ref 8–23)
CALCIUM: 9.1 mg/dL (ref 8.9–10.3)
CHLORIDE: 109 mmol/L (ref 98–111)
CO2: 27 mmol/L (ref 22–32)
CREATININE: 0.84 mg/dL (ref 0.61–1.24)
GFR calc Af Amer: >60 mL/min (ref >60)
GFR calc non Af Amer: >60 mL/min (ref >60)
Glucose, Bld: 153 mg/dL — ABNORMAL HIGH (ref 70–99)
Potassium: 4.8 mmol/L (ref 3.5–5.1)
Sodium: 140 mmol/L (ref 135–145)

## 2017-10-08 MED ORDER — SODIUM ZIRCONIUM CYCLOSILICATE 10 G PO PACK: 10.0000 g | PACK | Freq: Every day | ORAL | 11 refills | Status: DC

## 2017-10-08 NOTE — Telephone Encounter (Signed)
[employer] patient assistance denied for Xarelto since Joshua [name] needs to have spent $1636.72 on his medications for the year. Patient notified by [employer] PAF.   Joshua [name]. Joshua [name], PharmD, BCPS, CPP Clinical Pharmacist Phone: [phone] 10/08/2017 10:44 AM

## 2017-10-08 NOTE — Telephone Encounter (Signed)
Notes recorded by Kerry Dory, CMA on 10/08/2017 at 4:54 PM EDT Patient aware. patient voiced understanding, rx sent to pharmacy   ------  Notes recorded by Conrad Dundee, NP on 10/08/2017 at 12:12 PM EDT Please call. Potassium trending down back to normal. Continue lokelma daily.

## 2017-10-08 NOTE — Telephone Encounter (Signed)
-----  Message from Conrad Nik[place], NP sent at 10/08/2017 12:12 PM EDT ----- Please call. Potassium trending down back to normal. Continue lokelma daily.

## 2017-10-14 ENCOUNTER — Other Ambulatory Visit: Payer: Self-pay | Admitting: Endocrinology

## 2017-10-15 ENCOUNTER — Encounter: Payer: Self-pay | Admitting: Family Medicine

## 2017-10-15 ENCOUNTER — Ambulatory Visit (INDEPENDENT_AMBULATORY_CARE_PROVIDER_SITE_OTHER): Payer: Medicare Other | Admitting: Family Medicine

## 2017-10-15 ENCOUNTER — Inpatient Hospital Stay: Payer: Self-pay | Admitting: Family Medicine

## 2017-10-15 VITALS — BP 104/51 | HR 59 | Temp 97.8°F | Resp 16 | Ht 71.0 in | Wt 225.0 lb

## 2017-10-15 DIAGNOSIS — E875 Hyperkalemia: Secondary | ICD-10-CM

## 2017-10-15 DIAGNOSIS — N183 Chronic kidney disease, stage 3 unspecified: Secondary | ICD-10-CM

## 2017-10-15 DIAGNOSIS — I255 Ischemic cardiomyopathy: Secondary | ICD-10-CM | POA: Diagnosis not present

## 2017-10-15 LAB — BASIC METABOLIC PANEL
BUN: 27 mg/dL — ABNORMAL HIGH (ref 6–23)
CO2: 28 meq/L (ref 19–32)
Calcium: 9.1 mg/dL (ref 8.4–10.5)
Chloride: 106 mEq/L (ref 96–112)
Creatinine, Ser: 1.08 mg/dL (ref 0.40–1.50)
GFR: 70.19 mL/min (ref >60.00)
GLUCOSE: 230 mg/dL — AB (ref 70–99)
Potassium: 4.9 mEq/L (ref 3.5–5.1)
Sodium: 143 mEq/L (ref 135–145)

## 2017-10-15 NOTE — Progress Notes (Signed)
10/15/2017  CC:  Chief Complaint  Patient presents with  . Hospitalization Follow-up    Patient is a 77 y.o. Caucasian male who presents for  hospital follow up, specifically [facility] face-to-face visit. Dates hospitalized: 9/26-9/29, 2019. Days since d/c from hospital: 12 Patient was discharged from hospital to home. Reason for admission to hospital: generalized weakness, was found to have AKI on top of his CRI, hyperkalemia, and acute-on-chronic systolic HF. Date of interactive (phone) contact with patient and/or caregiver: 10/06/17  I have reviewed patient's discharge summary plus pertinent specific notes, labs, and imaging from the hospitalization.   ECHO completed and showed EF 50-55% with grade IDD. D/C wt 216 lbs.  Still with poor energy level, otherwise says he feels fine.   He can walk 100-125 ft with his cane before getting SOB enough to have to rest for a few minutes.  Has not had to use oxygen at home at all. He does feel like he has gained a bit of wt in last 10d, but says it is b/c he is eating a lot.  Swears he eats a low sodium diet. Drinking 84 oz of water per day.   No fevers.  He coughs if he lies completely supine.  He does have PND--clear mucous.   Orthostatic dizziness briefly ONLY if he stands up fast. Home bp's 110/50s, HR upper 60s. He was rx'd lokelma by cards but only took 2 sample doses and the rx has taken a while to get filled---he will pick up today.   PMH:  Past Medical History:  Diagnosis Date  . Acute respiratory distress 06/04/2016  . Adenomatous colon polyp 10/16/2011   Repeat 2018  . Arthritis     Hips, R>L & KNEES  . CAD, multiple vessel    3V CAD cath 08/08/13----CABG done shortly after.  Cath 06/2016: clear bipasses/no interventional lesion.  . 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 pylori neg, no dysplasia or malignancy.  . Chronic combined systolic and diastolic CHF, NYHA class 2  ([place]) 2016-2018   Followed by [facility].  Transesoph Echo 09/2016 EF improved to 40-45%; diffuse hypokinesis, Grd I DD.  [name] Chronic renal insufficiency, stage 3 (moderate) (HCC) 2018   GFR 50s  . COPD (chronic obstructive pulmonary disease) ([place])    GOLD II.  Spirometry  2004 borderline obstruction; 2015 mod obst: noncompliant with bid symbicort so pulmonologist switched him to a once daily inhaler: Trelegy ellipta 12/2015.  Oxygen prn: goal 88-92%.  . Diabetic nephropathy ([place])    Elevated urine microalb/cr 03/2011  . DM type 2 (diabetes mellitus, type 2) ([place])    Poor control on max oral meds--pt eventually agreed to insulin therapy.  As of 2017 his DM is being managed by Dr. Dwyane Dee in endo.  On insulin pump as of 04/2017 endo f/u.  [name] DOE (dyspnea on exertion)    COPD + chronic diastolic HF  . Dysthymia    Worsened after 2015 heart surgery.  Memory loss +? as well.  Neuro eval 01/2017--plan is to get MRI brain, B12 level, and neuropsychiatric testing.  . Erectile dysfunction    Normal testosterone  . Hearing loss of both ears 2016   Hearing aids  . Hyperplastic colon polyp 2001   No further colonoscopies to be done as of 11/2016 GI eval.  . Hypertension   . IBS (irritable bowel syndrome)    responds relatively well to prn levsin.  Pepto bismol q AM trial  by Dr. Loletha Carrow 08/2017, also checking fecal elastace and lactoferrin.  . Iron deficiency anemia 2014   03/2012 capsule endoscopy showed 2 AVMs--likely responsible for his IDA--lifetime iron supp recommended + q78moCBCs.  . Ischemic cardiomyopathy 06/2016; 09/2016   06/2016: EF 25-30%, global hypokinesis, grd II DD, vent septum motion changes c/w LBBB, mod MV regurg, L atr mod/sev dilat, R atr mod dilat, mod incr pulm press, Grd II DD.  09/2016--EF 40-45%; diffuse hypokinesis, Grd I DD.  . Macular degeneration, dry    Mild, bilat (Optometrist, DMayford Knifeat [employer]  . Mild cognitive impairment    MCI,  likley vascular etiology, + MDD, recurrent-->neuropsych testing 06/2017.  Neurol to repeat testing 1 yr  . Mixed hyperlipidemia    statin and vascepa as of 04/2017  . Nocturnal hypoxemia   . Obesity   . Open toe wound 11/2015   Wound care clinic appt made and then canceled when toe improved.  . Other and unspecified angina pectoris   . PAD (peripheral artery disease) ([place] 02/2016   Abnormal ABI's and waveforms: LE arterial duplex ordered for f/u as per cardiologist's recommendation.  Vasc eval by Dr. CBridgett Larsson impression was minimal PAD, recommended maximize med mgmt.  .Marland KitchenPAF (paroxysmal atrial fibrillation) ([place] 10/2014   xarelto  . Pericardial effusion with cardiac tamponade 6//29/15   pericardiocentesis was done,  Infectious/inflamm (cytology showed NO MALIGNANT CELLS)  . Pleural plaque    Pleural plaques/asbestosis changes on CT chest done by pulm 05/2016.  .Marland KitchenPneumonia   . Tobacco dependence in remission    100+ pack-yr hx: quit after CABG    PSH:  Past Surgical History:  Procedure Laterality Date  . APPENDECTOMY  1957  . CARDIAC CATHETERIZATION  08/08/2013  . CATARACT EXTRACTION W/ INTRAOCULAR LENS  IMPLANT, BILATERAL  04/08/2006 & 04/22/2006  . CATARACT EXTRACTION W/ INTRAOCULAR LENS  IMPLANT, BILATERAL Bilateral   . CHOLECYSTECTOMY OPEN  1999  . COLONOSCOPY  10/16/2011   Procedure: COLONOSCOPY;  Surgeon: RInda Castle MD;  Location: [facility];  Service: Endoscopy;  Laterality: N/A;  . CORONARY ARTERY BYPASS GRAFT N/A 08/14/2013   Procedure: CORONARY ARTERY BYPASS GRAFTING (CABG);  Surgeon: SMelrose Nakayama MD;  [facility]  Service: Open Heart Surgery;  Laterality: N/A;  Times 4   using left internal mammary artery and endoscopically harvested bilateral saphenous vein  . ESOPHAGOGASTRODUODENOSCOPY  02/25/12   Atrophic gastritis with a few erosions--capsule endoscopy planned as of 02/25/12 (Dr. KDeatra Ina.  .Marland KitchenHARDWARE REMOVAL Right 08/12/2012   Procedure: HARDWARE REMOVAL;   Surgeon: CMcarthur Rossetti MD;  Location: [facility];  Service: Orthopedics;  Laterality: Right;  . HIP SURGERY Right 1954   Repair of slipped capital femoral epiphysis.  . INTRAOPERATIVE TRANSESOPHAGEAL ECHOCARDIOGRAM N/A 08/14/2013   Normal LV function. Procedure: INTRAOPERATIVE TRANSESOPHAGEAL ECHOCARDIOGRAM;  Surgeon: SMelrose Nakayama MD;  [facility]  Service: Open Heart Surgery;  Laterality: N/A;  . LEFT HEART CATHETERIZATION WITH CORONARY ANGIOGRAM N/A 08/08/2013   Procedure: LEFT HEART CATHETERIZATION WITH CORONARY ANGIOGRAM;  Surgeon: JJettie Booze MD;  [facility];  Service: Cardiovascular;  Laterality: N/A;  . PATELLA FRACTURE SURGERY Left ~ 1979   bolt + 3 screws to repair tib plateau fx  . PERICARDIAL TAP N/A 07/01/2013   Procedure: PERICARDIAL TAP;  Surgeon: JJettie Booze MD;  [facility];  Service: Cardiovascular;  Laterality: N/A;  . RIGHT/LEFT HEART CATH AND CORONARY ANGIOGRAPHY N/A  06/10/2016   Procedure: Right/Left Heart Cath and Coronary Angiography;  Surgeon: Larey Dresser, MD;  Location: [facility];  Service: Cardiovascular;  Laterality: N/A;  . TESTICLE SURGERY  as a child   Undescended testicle brought down into scrotum  . TONSILLECTOMY  1947  . TOTAL HIP ARTHROPLASTY Right 08/12/2012   Procedure: REMOVAL OF OLD PINS RIGHT HIP AND RIGHT TOTAL HIP ARTHROPLASTY ANTERIOR APPROACH;  Surgeon: Mcarthur Rossetti, MD;  [facility];  Service: Orthopedics;  Laterality: Right;  . TRANSESOPHAGEAL ECHOCARDIOGRAM  09/22/2016   EF 40-45%; diffuse hypokinesis, Grd I DD.  [name] TRANSTHORACIC ECHOCARDIOGRAM  10/30/14   Mod LVH, EF 60-65%, normal wall motion, mod mitral regurg, mild PAH  . TRANSTHORACIC ECHOCARDIOGRAM  06/2016   06/2016: EF 25-30%, global hypokinesis, grd II DD, vent septum motion changes c/w LBBB, mod MV regurg, L atr mod/sev dilat, R atr mod dilat, mod incr pulm press, Grd II DD.    MEDS:  Outpatient Medications  Prior to Visit  Medication Sig Dispense Refill  . ACCU-CHEK GUIDE test strip USE TO CHECK BLOOD SUGAR 3 TIMES DAILY 100 each 11  . acetaminophen (TYLENOL) 325 MG tablet Take 2 tablets (650 mg total) by mouth every 4 (four) hours as needed for headache or mild pain.    . Ascorbic Acid (VITAMIN C) 1000 MG tablet Take 1,000 mg by mouth daily.    [name] atorvastatin (LIPITOR) 40 MG tablet Take 40 mg by mouth daily at 6 PM.     . bisoprolol (ZEBETA) 5 MG tablet Take 0.5 tablets (2.5 mg total) by mouth daily. 15 tablet 11  . Cholecalciferol (VITAMIN D-3) 1000 units CAPS Take 1,000 Units by mouth daily. Reported on 02/26/2015    . fluticasone (FLONASE) 50 MCG/ACT nasal spray Place 2 sprays into both nostrils daily. (Patient taking differently: Place 2 sprays into both nostrils as needed. ) 16 g 1  . gabapentin (NEURONTIN) 100 MG capsule TAKE TWO CAPSULES BY MOUTH THREE TIMES DAILY (Patient taking differently: Take 200 mg by mouth 2 (two) times daily. ) 180 capsule 11  . guaiFENesin (MUCINEX) 600 MG 12 hr tablet Take 2 tablets (1,200 mg total) by mouth 2 (two) times daily. 30 tablet 0  . hyoscyamine (LEVSIN SL) 0.125 MG SL tablet TAKE 1 TO 2 TABLETS BY MOUTH EVERY 6 HOURS AS NEEDED FOR DIARRHEA (Patient taking differently: Take 0.125-0.25 mg by mouth as needed for cramping. AS NEEDED FOR DIARRHEA) 60 tablet 3  . Icosapent Ethyl (VASCEPA) 1 g CAPS 2 capsules twice a day (Patient taking differently: Take 2 g by mouth 2 (two) times daily. ) 120 capsule 2  . Insulin Disposable Pump (V-GO 20) KIT USE AS DIRECTED 30 kit 3  . insulin regular human CONCENTRATED (HUMULIN R) 500 UNIT/ML injection INJECT 56 UNITS SUBCUTANEOUSLY DAILY IN INSULIN PUMP 20 mL 0  . isosorbide mononitrate (IMDUR) 30 MG 24 hr tablet TAKE ONE TABLET BY MOUTH EVERY DAY (Patient taking differently: Take 30 mg by mouth daily. Half a tablet a day) 90 tablet 1  . lipase/protease/amylase (CREON) 36000 UNITS CPEP capsule Take 2 capsules (72,000 Units  total) by mouth 3 (three) times daily with meals. (Patient taking differently: Take 72,000 Units by mouth daily with breakfast. ) 48 capsule 0  . mirtazapine (REMERON) 45 MG tablet Take 1 tablet (45 mg total) by mouth at bedtime. 30 tablet 5  . Multiple Vitamin (MULTIVITAMIN WITH MINERALS) TABS Take 1 tablet by mouth daily.    [name]  nitroGLYCERIN (NITROSTAT) 0.4 MG SL tablet PLACE 1 TABLET UNDER THE TONGUE EVERY 5 MINUTES AS NEEDED FOR CHEST PAIN (Patient taking differently: Place 0.4 mg under the tongue every 5 (five) minutes as needed for chest pain. ) 25 tablet 3  . PROAIR HFA 108 (90 Base) MCG/ACT inhaler INHALE 2 PUFFS INTO THE LUNGS EVERY 4 HOURS AS NEEDED FOR WHEEZING ORSHORTNESS OF BREATH (Patient taking differently: Inhale 2 puffs into the lungs every 4 (four) hours as needed for wheezing or shortness of breath. ) 8.5 g 1  . rivaroxaban (XARELTO) 20 MG TABS tablet Take 1 tablet (20 mg total) by mouth daily with supper. 30 tablet 11  . sertraline (ZOLOFT) 25 MG tablet Take 2 tablets (50 mg total) by mouth at bedtime. 60 tablet 5  . sodium zirconium cyclosilicate (LOKELMA) 10 g PACK packet Take 10 g by mouth daily. 30 packet 11  . SYMBICORT 160-4.5 MCG/ACT inhaler INHALE 2 PUFFS INTO THE LUNGS TWICE DAILY (Patient taking differently: Inhale 2 puffs into the lungs 2 (two) times daily. ) 10.2 g 5  . SYNJARDY 05-998 MG TABS TAKE ONE TABLET BY MOUTH TWICE DAILY AFTER A MEAL (Patient taking differently: Take 1 tablet by mouth 2 (two) times daily. ) 60 tablet 2  . tamsulosin (FLOMAX) 0.4 MG CAPS capsule TAKE 1 CAPSULE BY MOUTH EVERY DAY AFTER SUPPER (Patient taking differently: Take 0.4 mg by mouth daily after supper. ) 90 capsule 1  . torsemide (DEMADEX) 20 MG tablet Take 20 mg daily as needed for increase weight of 2 to 3 pounds in 24 hours. 60 tablet 6   No facility-administered medications prior to visit.    EXAM: BP (!) 104/51 (BP Location: Left Arm, Patient Position: Sitting, Cuff Size: Normal)    Pulse (!) 59   Temp 97.8 F (36.6 C) (Temporal)   Resp 16   Ht _0  (1.803 m)   Wt 225 lb (102.1 kg)   SpO2 94%   BMI 31.38 kg/m  Gen: Alert, well appearing.  Patient is oriented to person, place, time, and situation. AFFECT: pleasant, lucid thought and speech. ZOX:WRUE: no injection, icteris, swelling, or exudate.  EOMI, PERRLA. Mouth: lips without lesion/swelling.  Oral mucosa pink and moist. Oropharynx without erythema, exudate, or swelling.  CV: RRR, no m/r/g.   LUNGS: CTA bilat, nonlabored resps, good aeration in all lung fields. ABD: soft, NT/ND EXT; no clubbing or cyanosis.  He has 1+ bilat LL pitting edema.  Pertinent labs/imaging   Chemistry      Component Value Date/Time   NA 140 10/08/2017 1002   K 4.8 10/08/2017 1002   CL 109 10/08/2017 1002   CO2 27 10/08/2017 1002   BUN 16 10/08/2017 1002   CREATININE 0.84 10/08/2017 1002   CREATININE 1.22 (H) 01/11/2017 1018      Component Value Date/Time   CALCIUM 9.1 10/08/2017 1002   ALKPHOS 63 09/30/2017 1237   AST 18 09/30/2017 1237   ALT 16 09/30/2017 1237   BILITOT 0.4 09/30/2017 1237     Lab Results  Component Value Date   WBC 10.8 (H) 10/05/2017   HGB 14.8 10/05/2017   HCT 46.4 10/05/2017   MCV 87.7 10/05/2017   PLT 168 10/05/2017   Lab Results  Component Value Date   VITAMINB12 381 01/11/2017    ASSESSMENT/PLAN:  1) Hyperkalemia: most recent K was 4.8 on 10/08/17 (by cardiologist). He starts lokelma 10g qd today.  Baseline BMET drawn today. Of course we'll keep him off ACE-I/ARB  and aldactone b/c of his big issue with hyperkalemia. No med changes made today. Reviewed plan for prn use of torsemide 34m.  2) Ischemic CM: improved EF on echo in hosp. Continue BB, statin, imdur, prn torsemide. Fluid restriction 2L/day.  3) CRI: AKI in hosp. Recheck BMET today.  Medical decision making of moderate complexity was utilized today.  An After Visit Summary was printed and given to the  patient.  FOLLOW UP:  6 wks  Signed:  PCrissie Sickles MD           10/18/2017

## 2017-10-18 ENCOUNTER — Other Ambulatory Visit (HOSPITAL_COMMUNITY): Payer: Self-pay | Admitting: Cardiology

## 2017-10-18 ENCOUNTER — Other Ambulatory Visit: Payer: Self-pay

## 2017-10-18 ENCOUNTER — Telehealth: Payer: Self-pay | Admitting: Gastroenterology

## 2017-10-19 ENCOUNTER — Encounter: Payer: Self-pay | Admitting: Internal Medicine

## 2017-10-19 ENCOUNTER — Ambulatory Visit: Payer: Medicare Other | Admitting: Internal Medicine

## 2017-10-19 ENCOUNTER — Other Ambulatory Visit: Payer: Self-pay

## 2017-10-19 VITALS — BP 156/66 | HR 63 | Ht 71.0 in | Wt 226.0 lb

## 2017-10-19 DIAGNOSIS — E1165 Type 2 diabetes mellitus with hyperglycemia: Secondary | ICD-10-CM

## 2017-10-19 DIAGNOSIS — Z794 Long term (current) use of insulin: Secondary | ICD-10-CM | POA: Diagnosis not present

## 2017-10-19 LAB — GLUCOSE, POCT (MANUAL RESULT ENTRY): POC Glucose: 212 mg/dl — AB (ref 70–99)

## 2017-10-19 MED ORDER — MIRTAZAPINE 45 MG PO TABS: 45.0000 mg | ORAL_TABLET | Freq: Every day | ORAL | 5 refills | Status: AC

## 2017-10-19 MED ORDER — PANCRELIPASE (LIP-PROT-AMYL) 36000-114000 UNITS PO CPEP: 72000.0000 [IU] | ORAL_CAPSULE | Freq: Three times a day (TID) | ORAL | 3 refills | Status: DC

## 2017-10-19 NOTE — Telephone Encounter (Signed)
Left message to return call.

## 2017-10-19 NOTE — Telephone Encounter (Signed)
Spoke with pts wife. She stated that the husband seems to be doing well on Creon. Informed her that Vivien Rota sent in a prescription for Creon to their [facility]. Wife understood.

## 2017-10-19 NOTE — Telephone Encounter (Signed)
Pts wife returned call. She indicates that the Creon has been improving symptoms.  Aware that Creon Rx was sent to the pharmacy.

## 2017-10-19 NOTE — Progress Notes (Signed)
Patient ID: ACIE CUSTIS, male   DOB: 06-11-1940, 78 y.o.   MRN: [id number]            Reason for Appointment:  Follow-up for Type 2 Diabetes  Referring physician: [place]   History of Present Illness:                Background history:   Mr. Kratochvil is a 77 year old Male with PMH of HTN, T2DM, PAF and CHF . Who was recently admitted to the hospital for fatigue secondary to AKI.   He has been diagnosed with Type 2 DM in 2002. He was initially treated with metformin which was continued until about 2016. He previously has also been tried on Amaryl, Actos, Farxiga and Victoza but he claims that these did not help his blood sugar and were stopped. It appears that insulin was started ~ 2014 .  His A1c has ranged from  7.3 % in 2013 peaking at 10.8 % in 2016.   He was initially on an  MDI insulin regimen subsequently changed to U-500. He was subsequently started on V-Go  With U-500.    Recent history:   Patient had an ER visit a few weeks ago for AKI, his synjardy was held for a few days until his renal function recovered. Patient is back on synjardy with no side effects and his latest GFR is 70 mL/min.   Patient does c/o hypoglycemia with BG's below 70 mg/dL. Patient denies any hypoglycemia symptoms until his BG is below 30 mg/dL that's when he notices diaphoresis and blurry vision.   INSULIN regimen U-500 insulin with V-go 20 units basal, bolus clicks: 7-4-2 at meals    Non-insulin hypoglycemic drugs the patient is taking are: Synjardy 05/998 twice a day breakfast and suppertime   METER DOWNLOAD SUMMARY:  Date range 10/2-10/15/19 Fingerstick Blood Glucose Tests = 34 Average Number Tests/Day = 2.4 Overall Mean FS Glucose = 120 Standard Deviation = 62.6  BG Ranges: Low = 39 High = 294   Hypoglycemic Events/30 Days: BG < 50 = 4 Episodes of symptomatic severe hypoglycemia = 0   Dietician visit, most recent: 01/09/16 CDE visit: 12/18                  Glycemic control:     Lab Results  Component Value Date   HGBA1C 9.0 (H) 10/01/2017   HGBA1C 8.3 (A) 09/29/2017   HGBA1C 8.4 04/26/2017   Lab Results  Component Value Date   MICROALBUR 1.4 07/02/2017   LDLCALC 5 02/23/2017   CREATININE 1.08 10/15/2017   Lab Results  Component Value Date   MICRALBCREAT 12.2 07/02/2017    Other active problems: See review of systems   Allergies as of 10/19/2017      Reactions   Morphine And Related Other (See Comments)   Drenched with perspiration   Entresto [sacubitril-valsartan]    hypotension   Demerol [meperidine] Nausea Only   Starlix [nateglinide] Other (See Comments)   gassy      Medication List        Accurate as of 10/19/17  9:59 AM. Always use your most recent med list.          ACCU-CHEK GUIDE test strip Generic drug:  glucose blood USE TO CHECK BLOOD SUGAR 3 TIMES DAILY   acetaminophen 325 MG tablet Commonly known as:  TYLENOL Take 2 tablets (650 mg total) by mouth every 4 (four) hours as needed for headache or mild pain.   atorvastatin  40 MG tablet Commonly known as:  LIPITOR Take 40 mg by mouth daily at 6 PM.   bisoprolol 5 MG tablet Commonly known as:  ZEBETA Take 0.5 tablets (2.5 mg total) by mouth daily.   fluticasone 50 MCG/ACT nasal spray Commonly known as:  FLONASE Place 2 sprays into both nostrils daily.   gabapentin 100 MG capsule Commonly known as:  NEURONTIN TAKE TWO CAPSULES BY MOUTH THREE TIMES DAILY   guaiFENesin 600 MG 12 hr tablet Commonly known as:  MUCINEX Take 2 tablets (1,200 mg total) by mouth 2 (two) times daily.   hyoscyamine 0.125 MG SL tablet Commonly known as:  LEVSIN SL TAKE 1 TO 2 TABLETS BY MOUTH EVERY 6 HOURS AS NEEDED FOR DIARRHEA   Icosapent Ethyl 1 g Caps 2 capsules twice a day   insulin regular human CONCENTRATED 500 UNIT/ML injection Commonly known as:  HUMULIN R INJECT 56 UNITS SUBCUTANEOUSLY DAILY IN INSULIN PUMP   isosorbide mononitrate 30 MG 24 hr tablet Commonly known as:   IMDUR TAKE ONE TABLET BY MOUTH EVERY DAY   lipase/protease/amylase 36000 UNITS Cpep capsule Commonly known as:  CREON Take 2 capsules (72,000 Units total) by mouth 3 (three) times daily with meals.   mirtazapine 45 MG tablet Commonly known as:  REMERON Take 1 tablet (45 mg total) by mouth at bedtime.   multivitamin with minerals Tabs tablet Take 1 tablet by mouth daily.   nitroGLYCERIN 0.4 MG SL tablet Commonly known as:  NITROSTAT PLACE 1 TABLET UNDER THE TONGUE EVERY 5 MINUTES AS NEEDED FOR CHEST PAIN   PROAIR HFA 108 (90 Base) MCG/ACT inhaler Generic drug:  albuterol INHALE 2 PUFFS INTO THE LUNGS EVERY 4 HOURS AS NEEDED FOR WHEEZING ORSHORTNESS OF BREATH   rivaroxaban 20 MG Tabs tablet Commonly known as:  XARELTO Take 1 tablet (20 mg total) by mouth daily with supper.   sertraline 25 MG tablet Commonly known as:  ZOLOFT Take 2 tablets (50 mg total) by mouth at bedtime.   sodium zirconium cyclosilicate 10 g Pack packet Commonly known as:  LOKELMA Take 10 g by mouth daily.   SYMBICORT 160-4.5 MCG/ACT inhaler Generic drug:  budesonide-formoterol INHALE 2 PUFFS INTO THE LUNGS TWICE DAILY   SYNJARDY 05-998 MG Tabs Generic drug:  Empagliflozin-metFORMIN HCl TAKE ONE TABLET BY MOUTH TWICE DAILY AFTER A MEAL   tamsulosin 0.4 MG Caps capsule Commonly known as:  FLOMAX TAKE 1 CAPSULE BY MOUTH EVERY DAY AFTER SUPPER   torsemide 20 MG tablet Commonly known as:  DEMADEX Take 20 mg daily as needed for increase weight of 2 to 3 pounds in 24 hours.   V-GO 20 Kit USE AS DIRECTED   vitamin C 1000 MG tablet Take 1,000 mg by mouth daily.   Vitamin D-3 1000 units Caps Take 1,000 Units by mouth daily. Reported on 02/26/2015       Allergies:  Allergies  Allergen Reactions  . Morphine And Related Other (See Comments)    Drenched with perspiration  . Entresto [Sacubitril-Valsartan]     hypotension  . Demerol [Meperidine] Nausea Only  . Starlix [Nateglinide] Other (See  Comments)    gassy    Past Medical History:  Diagnosis Date  . Acute respiratory distress 06/04/2016  . Adenomatous colon polyp 10/16/2011   Repeat 2018  . Arthritis     Hips, R>L & KNEES  . CAD, multiple vessel    3V CAD cath 08/08/13----CABG done shortly after.  Cath 06/2016: clear bipasses/no interventional lesion.  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 pylori neg, no dysplasia or malignancy.  . Chronic combined systolic and diastolic CHF, NYHA class 2 ([place]) 2016-2018   Followed by [facility].  Transesoph Echo 09/2016 EF improved to 40-45%; diffuse hypokinesis, Grd I DD.  [name] Chronic renal insufficiency, stage 3 (moderate) ([facility]) 2018   GFR 50s  . COPD (chronic obstructive pulmonary disease) ([place])    GOLD II.  Spirometry  2004 borderline obstruction; 2015 mod obst: noncompliant with bid symbicort so pulmonologist switched him to a once daily inhaler: Trelegy ellipta 12/2015.  Oxygen prn: goal 88-92%.  . Diabetic nephropathy ([place])    Elevated urine microalb/cr 03/2011  . DM type 2 (diabetes mellitus, type 2) ([place])    Poor control on max oral meds--pt eventually agreed to insulin therapy.  As of 2017 his DM is being managed by Dr. Dwyane Dee in endo.  On insulin pump as of 04/2017 endo f/u.  [name] DOE (dyspnea on exertion)    COPD + chronic diastolic HF  . Dysthymia    Worsened after 2015 heart surgery.  Memory loss +? as well.  Neuro eval 01/2017--plan is to get MRI brain, B12 level, and neuropsychiatric testing.  . Erectile dysfunction    Normal testosterone  . Hearing loss of both ears 2016   Hearing aids  . Hyperplastic colon polyp 2001   No further colonoscopies to be done as of 11/2016 GI eval.  . Hypertension   . IBS (irritable bowel syndrome)    responds relatively well to prn levsin.  Pepto bismol q AM trial by Dr. Loletha Carrow 08/2017, also checking fecal elastace and lactoferrin.  . Iron deficiency anemia 2014   03/2012 capsule endoscopy showed  2 AVMs--likely responsible for his IDA--lifetime iron supp recommended + q36moCBCs.  . Ischemic cardiomyopathy 06/2016; 09/2016   06/2016: EF 25-30%, global hypokinesis, grd II DD, vent septum motion changes c/w LBBB, mod MV regurg, L atr mod/sev dilat, R atr mod dilat, mod incr pulm press, Grd II DD.  09/2016--EF 40-45%; diffuse hypokinesis, Grd I DD.  . Macular degeneration, dry    Mild, bilat (Optometrist, DMayford Knifeat [employer] [place]  . Mild cognitive impairment    MCI, likley vascular etiology, + MDD, recurrent-->neuropsych testing 06/2017.  Neurol to repeat testing 1 yr  . Mixed hyperlipidemia    statin and vascepa as of 04/2017  . Nocturnal hypoxemia   . Obesity   . Open toe wound 11/2015   Wound care clinic appt made and then canceled when toe improved.  . Other and unspecified angina pectoris   . P[place] artery disease[place] 02/[place]   Abnormal ABI's and waveforms: LE arterial duplex ordered for f/u as per cardiologist's recommendation.  Vasc eval by Dr. CBridgett Larsson impression was minimal PAD, recommended maximize med mgmt.  .Marland KitchenPAF (paroxysmal atrial fibrillation) ([facility] [facility] 10/2014   xarelto  . Pericardial effusion with cardiac tamponade 6//29/15   pericardiocentesis was done,  Infectious/inflamm (cytology showed NO MALIGNANT CELLS)  . Pleural plaque    Pleural plaques/asbestosis changes on CT chest done by pulm 05/2016.  .Marland KitchenPneumonia   . Tobacco dependence in remission    100+ pack-yr hx: quit after CABG    Past Surgical History:  Procedure Laterality Date  . APPENDECTOMY  1957  . CARDIAC CATHETERIZATION  08/08/2013  . CATARACT EXTRACTION W/ INTRAOCULAR LENS  IMPLANT, BILATERAL  04/08/2006 & 04/22/2006  . CATARACT EXTRACTION W/ INTRAOCULAR LENS  IMPLANT, BILATERAL Bilateral   . CHOLECYSTECTOMY OPEN  1999  . COLONOSCOPY  10/16/2011   Procedure: COLONOSCOPY;  Surgeon: Inda Castle, MD;  Location: [facility];  Service: Endoscopy;  Laterality: N/A;  .  CORONARY ARTERY BYPASS GRAFT N/A 08/14/2013   Procedure: CORONARY ARTERY BYPASS GRAFTING (CABG);  Surgeon: Melrose Nakayama, MD;  Location: [facility];  Service: Open Heart Surgery;  Laterality: N/A;  Times 4   using left internal mammary artery and endoscopically harvested bilateral saphenous vein  . ESOPHAGOGASTRODUODENOSCOPY  02/25/12   Atrophic gastritis with a few erosions--capsule endoscopy planned as of 02/25/12 (Dr. Deatra Ina).  [name] HARDWARE REMOVAL Right 08/12/2012   Procedure: HARDWARE REMOVAL;  Surgeon: Mcarthur Rossetti, MD;  Location: [facility];  Service: Orthopedics;  Laterality: Right;  . HIP SURGERY Right 1954   Repair of slipped capital femoral epiphysis.  . INTRAOPERATIVE TRANSESOPHAGEAL ECHOCARDIOGRAM N/A 08/14/2013   Normal LV function. Procedure: INTRAOPERATIVE TRANSESOPHAGEAL ECHOCARDIOGRAM;  Surgeon: Melrose Nakayama, MD;  Location: [facility];  Service: Open Heart Surgery;  Laterality: N/A;  . LEFT HEART CATHETERIZATION WITH CORONARY ANGIOGRAM N/A 08/08/2013   Procedure: LEFT HEART CATHETERIZATION WITH CORONARY ANGIOGRAM;  Surgeon: Jettie Booze, MD;  Location[facility] CATH LAB;  Service: Cardiovascular;  Laterality: N/A;  . PATELLA FRACTURE SURGERY Left ~ 1979   bolt + 3 screws to repair tib plateau fx  . PERICARDIAL TAP N/A 07/01/2013   Procedure: PERICARDIAL TAP;  Surgeon: Jettie Booze, MD;  Location: [facility] CATH LAB;  Service: Cardiovascular;  Laterality: N/A;  . RIGHT/LEFT HEART CATH AND CORONARY ANGIOGRAPHY N/A 06/10/2016   Procedure: Right/Left Heart Cath and Coronary Angiography;  Surgeon: Larey Dresser, MD;  Location: [place] CV LAB;  Service: Cardiovascular;  Laterality: N/A;  . TESTICLE SURGERY  as a child   Undescended testicle brought down into scrotum  . TONSILLECTOMY  1947  . TOTAL HIP ARTHROPLASTY Right 08/12/2012   Procedure: REMOVAL OF OLD PINS RIGHT HIP AND RIGHT TOTAL HIP ARTHROPLASTY ANTERIOR APPROACH;  Surgeon: Mcarthur Rossetti, MD;  Location: [facility];  Service: Orthopedics;  Laterality: Right;  . TRANSESOPHAGEAL ECHOCARDIOGRAM  09/22/2016   EF 40-45%; diffuse hypokinesis, Grd I DD.  [name] TRANSTHORACIC ECHOCARDIOGRAM  10/30/14   Mod LVH, EF 60-65%, normal wall motion, mod mitral regurg, mild PAH  . TRANSTHORACIC ECHOCARDIOGRAM  06/2016; 10/01/17   06/2016: EF 25-30%, global hypokinesis, grd II DD, vent septum motion changes c/w LBBB, mod MV regurg, L atr mod/sev dilat, R atr mod dilat, mod incr pulm press, Grd II DD.  09/2017: EF 50-55%, Hypokinesis of the basal inferolateral wall, grd I DD, mild MR, +LAE.    Family History  Problem Relation Age of Onset  . Heart disease Father   . Heart attack Father   . CVA Mother   . Hypertension Mother   . Diabetes Paternal Grandmother   . Breast cancer Sister   . Diabetes Maternal Uncle   . Heart disease Maternal Uncle   . Stroke Neg Hx     Social History:  reports that he has been smoking cigarettes. He has a 64.00 pack-year smoking history. He quit smokeless tobacco use about 4 years ago. He reports that he does not drink alcohol or use drugs.   Review of Systems  Constitutional: Positive for reduced appetite and malaise.  HENT: Negative for headaches and nasal congestion.   Eyes: Negative for blurred vision.  Respiratory: Positive for cough. Negative for shortness of breath.   Cardiovascular: Negative  for chest pain and palpitations.  Gastrointestinal: Positive for diarrhea. Negative for vomiting.  Endocrine: Positive for heat intolerance.  Genitourinary: Negative for dysuria.  Skin: Negative for rash and itching.  Allergic/Immunologic: Negative for rhinorrhea.  Neurological: Negative for weakness and tremors.  Psychiatric/Behavioral: Negative for insomnia.     LABS:  Office Visit on 10/15/2017  Component Date Value Ref Range Status  . Sodium 10/15/2017 143  135 - 145 mEq/L Final  . Potassium 10/15/2017 4.9  3.5 - 5.1 mEq/L Final  . Chloride 10/15/2017 106  96 - 112 mEq/L Final   . CO2 10/15/2017 28  19 - 32 mEq/L Final  . Glucose, Bld 10/15/2017 230* 70 - 99 mg/dL Final  . BUN 10/15/2017 27* 6 - 23 mg/dL Final  . Creatinine, Ser 10/15/2017 1.08  0.40 - 1.50 mg/dL Final  . Calcium 10/15/2017 9.1  8.4 - 10.5 mg/dL Final  . GFR 10/15/2017 70.19  >60.00 mL/min Final    Physical Examination:  PHYSICAL EXAM: VS: BP (!) 156/66 (BP Location: Left Arm, Patient Position: Sitting, Cuff Size: Normal)   Pulse 63   Ht _0  (1.803 m)   Wt 102.5 kg   SpO2 95%   BMI 31.52 kg/m    EXAM: General: Pt appears well and is in NAD  Hydration: Well-hydrated with with moist mucous membranes and good skin turgor  Eyes: External eye exam normal without stare, lid lag or exophthalmos.  EOM intact.  PERRL.  Ears, Nose, Throat: Hearing: grossly intact bilaterally Dental: Good dentition  Throat: Clear without mass, erythema or exudate  Neck: General: Supple without adenopathy. Thyroid: Thyroid size normal.  No goiter or nodules appreciated. No thyroid bruit.  Lungs: Clear with good BS bilat with no rales, rhonchi, or wheezes  Heart: Auscultation: RRR with normal S1 and S2, no gallops or murmurs Carotid arteries: no bruits Periph. circulation: no peripheral edema  Abdomen: Normoactive bowel sounds, soft, nontender, without masses or organomegaly palpable  Lymphatics:   Extremities:  BL LE: no pretibial edema normal ROM and strength, no joint enlargement or tenderness  Skin: Hair: texture and amount normal with gender appropriate distribution Skin Inspection: no rashes, acanthosis nigricans/skin tags. No lipohypertrophy. Skin Palpation: skin temperature, texture, and thickness normal to palpation  Neuro: Cranial nerves: II - XII grossly intact ;  Motor: normal strength throughout DTRs: 2+ and symmetric in UE without delay in relaxation phase  Mental Status: Judgment, insight: intact Orientation: oriented to time, place, and person Mood and affect: no depression, anxiety, or  agitation         ASSESSMENT/PLAN:  Diabetes type 2, uncontrolled, on the V-go insulin pump with U-500 insulin with hypoglycemia unawareness:   - Patient noted with recurrent hypoglycemia through the night and during the day after bolusing for meals.  -  Due to hypoglycemia unawareness, I would recommend relaxing his glucose goals for the next few weeks.  - Patient agreed to discontinuing V-Go as he is on the minimum dose.  - Previously (Via V-GO ) he was on 100 units of basal insulin and was taking 60 units of bolus insulin.  - Reviewed rule15 for hypoglycemia - Patient advised to call the office with BG's < 70 or > 300 mg/dL.  - The patient did complaint about difficulty filling the V-GO with insulin due to dexterity issues and having to try multiple times to fill the pump.    Recommendations:  - Stop V-GO pump - Continue Novolin U-500 at 50 units TID with meals (  Patient advised to pull the syringe to #10)  - If this will be a long term plan for him, would recommend using the dedicated U-500 syringe to avoid incorrect dosing.   Influenza vaccine given   F/U in 1 week with glucose log.   Lucienne Capers Brylee Mcgreal 10/19/2017, 9:59 AM      Lucienne Capers Netty Sullivant

## 2017-10-19 NOTE — Patient Instructions (Signed)
-  Please stop using the V-GO pump - Please start using the U-500 insulin syringe please pull back to 10 milliliter on the syringe , three times a day with each meal.   -HOW TO TREAT LOW BLOOD SUGARS (Blood sugar LESS THAN 70 MG/DL)  Please follow the RULE OF 15 for the treatment of hypoglycemia treatment (when your (blood sugars are less than 70 mg/dL)    STEP 1: Take 15 grams of carbohydrates when your blood sugar is low, which includes:   3-4 GLUCOSE TABS  OR  3-4 OZ OF JUICE OR REGULAR SODA OR  ONE TUBE OF GLUCOSE GEL     STEP 2: RECHECK blood sugar in 15 MINUTES STEP 3: If your blood sugar is still low at the 15 minute recheck --> then, go back to STEP 1 and treat AGAIN with another 15 grams of carbohydrates.  - Please call the office with glucose less then 70 or higher then 300 mg/dL.

## 2017-10-26 ENCOUNTER — Ambulatory Visit: Payer: Medicare Other | Admitting: Internal Medicine

## 2017-10-26 VITALS — BP 110/70 | HR 65 | Ht 71.0 in | Wt 228.6 lb

## 2017-10-26 DIAGNOSIS — E1142 Type 2 diabetes mellitus with diabetic polyneuropathy: Secondary | ICD-10-CM | POA: Diagnosis not present

## 2017-10-26 NOTE — Progress Notes (Signed)
Patient ID: Joshua [name], male   DOB: 1940/06/22, 77 y.o.   MRN: [id number]            Reason for Appointment:  Follow-up for Type 2 Diabetes  Referring physician: [place]   History of Present Illness:                Background history:   Joshua [name] is a 77 year old Male with PMH of HTN, T2DM, PAF and CHF . Who was recently admitted to the hospital for fatigue secondary to AKI.   He has been diagnosed with Type 2 DM in 2002. He was initially treated with metformin which was continued until about 2016. He previously has also been tried on Amaryl, Actos, Farxiga and Victoza but he claims that these did not help his blood sugar and were stopped. It appears that insulin was started ~ 2014 .  His A1c has ranged from  7.3 % in 2013 peaking at 10.8 % in 2016.   He was initially on an  MDI insulin regimen subsequently changed to U-500. He was subsequently started on V-Go  With U-500. But this was stopped due to frequent hypoglycemic episodes in 10/2017   Recent history:  He is here for a one week follow up for diabetes management. On his last visit here he was noted with hypoglycemic episodes while on U-500 through the V-GO 20 pump. The pump was discontinued and he was advised to use injections instead    INSULIN regimen U-500 50 units TID with meals.  Non-insulin hypoglycemic drugs the patient is taking are: Synjardy 05/998 twice a day breakfast and suppertime   METER DOWNLOAD SUMMARY:   Fingerstick Blood Glucose Tests = 46 Average Number Tests/Day = 3.3 Overall Mean FS Glucose = 131 Standard Deviation = 62.1  BG Ranges: Low = 49 High = 294   Hypoglycemic Events/30 Days: BG < 50 = 1 Episodes of symptomatic severe hypoglycemia = 0   Fasting range 49-88 Pre-Lunch range 109- 271 Pre-supper Range 81-294 Bedtime Range 116-132    Dietician visit, most recent: 01/09/16 CDE visit: 12/18                  Glycemic control:   Lab Results  Component Value Date   HGBA1C 9.0  (H) 10/01/2017   HGBA1C 8.3 (A) 09/29/2017   HGBA1C 8.4 04/26/2017   Lab Results  Component Value Date   MICROALBUR 1.4 07/02/2017   LDLCALC 5 02/23/2017   CREATININE 1.08 10/15/2017   Lab Results  Component Value Date   MICRALBCREAT 12.2 07/02/2017    Other active problems: See review of systems   Allergies as of 10/26/2017      Reactions   Morphine And Related Other (See Comments)   Drenched with perspiration   Entresto [sacubitril-valsartan]    hypotension   Demerol [meperidine] Nausea Only   Starlix [nateglinide] Other (See Comments)   gassy      Medication List        Accurate as of 10/26/17  9:29 AM. Always use your most recent med list.          ACCU-CHEK GUIDE test strip Generic drug:  glucose blood USE TO CHECK BLOOD SUGAR 3 TIMES DAILY   acetaminophen 325 MG tablet Commonly known as:  TYLENOL Take 2 tablets (650 mg total) by mouth every 4 (four) hours as needed for headache or mild pain.   atorvastatin 40 MG tablet Commonly known as:  LIPITOR Take 40 mg by mouth  daily at 6 PM.   bisoprolol 5 MG tablet Commonly known as:  ZEBETA Take 0.5 tablets (2.5 mg total) by mouth daily.   fluticasone 50 MCG/ACT nasal spray Commonly known as:  FLONASE Place 2 sprays into both nostrils daily.   gabapentin 100 MG capsule Commonly known as:  NEURONTIN TAKE TWO CAPSULES BY MOUTH THREE TIMES DAILY   guaiFENesin 600 MG 12 hr tablet Commonly known as:  MUCINEX Take 2 tablets (1,200 mg total) by mouth 2 (two) times daily.   hyoscyamine 0.125 MG SL tablet Commonly known as:  LEVSIN SL TAKE 1 TO 2 TABLETS BY MOUTH EVERY 6 HOURS AS NEEDED FOR DIARRHEA   Icosapent Ethyl 1 g Caps 2 capsules twice a day   insulin regular human CONCENTRATED 500 UNIT/ML injection Commonly known as:  HUMULIN R INJECT 56 UNITS SUBCUTANEOUSLY DAILY IN INSULIN PUMP   isosorbide mononitrate 30 MG 24 hr tablet Commonly known as:  IMDUR TAKE ONE TABLET BY MOUTH EVERY DAY     lipase/protease/amylase 36000 UNITS Cpep capsule Commonly known as:  CREON Take 2 capsules (72,000 Units total) by mouth 3 (three) times daily with meals.   mirtazapine 45 MG tablet Commonly known as:  REMERON Take 1 tablet (45 mg total) by mouth at bedtime.   multivitamin with minerals Tabs tablet Take 1 tablet by mouth daily.   nitroGLYCERIN 0.4 MG SL tablet Commonly known as:  NITROSTAT PLACE 1 TABLET UNDER THE TONGUE EVERY 5 MINUTES AS NEEDED FOR CHEST PAIN   PROAIR HFA 108 (90 Base) MCG/ACT inhaler Generic drug:  albuterol INHALE 2 PUFFS INTO THE LUNGS EVERY 4 HOURS AS NEEDED FOR WHEEZING ORSHORTNESS OF BREATH   rivaroxaban 20 MG Tabs tablet Commonly known as:  XARELTO Take 1 tablet (20 mg total) by mouth daily with supper.   sertraline 25 MG tablet Commonly known as:  ZOLOFT Take 2 tablets (50 mg total) by mouth at bedtime.   sodium zirconium cyclosilicate 10 g Pack packet Commonly known as:  LOKELMA Take 10 g by mouth daily.   SYMBICORT 160-4.5 MCG/ACT inhaler Generic drug:  budesonide-formoterol INHALE 2 PUFFS INTO THE LUNGS TWICE DAILY   SYNJARDY 05-998 MG Tabs Generic drug:  Empagliflozin-metFORMIN HCl TAKE ONE TABLET BY MOUTH TWICE DAILY AFTER A MEAL   tamsulosin 0.4 MG Caps capsule Commonly known as:  FLOMAX TAKE 1 CAPSULE BY MOUTH EVERY DAY AFTER SUPPER   torsemide 20 MG tablet Commonly known as:  DEMADEX Take 20 mg daily as needed for increase weight of 2 to 3 pounds in 24 hours.   vitamin C 1000 MG tablet Take 1,000 mg by mouth daily.   Vitamin D-3 1000 units Caps Take 1,000 Units by mouth daily. Reported on 02/26/2015       Allergies:  Allergies  Allergen Reactions  . Morphine And Related Other (See Comments)    Drenched with perspiration  . Entresto [Sacubitril-Valsartan]     hypotension  . Demerol [Meperidine] Nausea Only  . Starlix [Nateglinide] Other (See Comments)    gassy    Past Medical History:  Diagnosis Date  . Acute  respiratory distress 06/04/2016  . Adenomatous colon polyp 10/16/2011   Repeat 2018  . Arthritis     Hips, R>L & KNEES  . CAD, multiple vessel    3V CAD cath 08/08/13----CABG done shortly after.  Cath 06/2016: clear bipasses/no interventional lesion.  . 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 pylori neg, no dysplasia or malignancy.  [name]  Chronic combined systolic and diastolic CHF, NYHA class 2 ([place]) 2016-2018   Followed by [facility].  Transesoph Echo 09/2016 EF improved to 40-45%; diffuse hypokinesis, Grd I DD.  [name] Chronic renal insufficiency, stage 3 (moderate) (HCC) 2018   GFR 50s  . COPD (chronic obstructive pulmonary disease) ([place])    GOLD II.  Spirometry  2004 borderline obstruction; 2015 mod obst: noncompliant with bid symbicort so pulmonologist switched him to a once daily inhaler: Trelegy ellipta 12/2015.  Oxygen prn: goal 88-92%.  . Diabetic nephropathy ([place])    Elevated urine microalb/cr 03/2011  . DM type 2 (diabetes mellitus, type 2) ([place])    Poor control on max oral meds--pt eventually agreed to insulin therapy.  As of 2017 his DM is being managed by Dr. Dwyane Dee in endo.  On insulin pump as of 04/2017 endo f/u.  [name] DOE (dyspnea on exertion)    COPD + chronic diastolic HF  . Dysthymia    Worsened after 2015 heart surgery.  Memory loss +? as well.  Neuro eval 01/2017--plan is to get MRI brain, B12 level, and neuropsychiatric testing.  . Erectile dysfunction    Normal testosterone  . Hearing loss of both ears 2016   Hearing aids  . Hyperplastic colon polyp 2001   No further colonoscopies to be done as of 11/2016 GI eval.  . Hypertension   . IBS (irritable bowel syndrome)    responds relatively well to prn levsin.  Pepto bismol q AM trial by Dr. Loletha Carrow 08/2017, also checking fecal elastace and lactoferrin.  . Iron deficiency anemia 2014   03/2012 capsule endoscopy showed 2 AVMs--likely responsible for his IDA--lifetime iron supp recommended +  q77moCBCs.  . Ischemic cardiomyopathy 06/2016; 09/2016   06/2016: EF 25-30%, global hypokinesis, grd II DD, vent septum motion changes c/w LBBB, mod MV regurg, L atr mod/sev dilat, R atr mod dilat, mod incr pulm press, Grd II DD.  09/2016--EF 40-45%; diffuse hypokinesis, Grd I DD.  . Macular degeneration, dry    Mild, bilat (Optometrist, [place]ife[place]  . Mild cognitive impairment    MCI, likley vascular etiology, + MDD, recurrent-->neuropsych testing 06/2017.  Neurol to repeat testing 1 yr  . Mixed hyperlipidemia    statin and vascepa as of 04/2017  . Nocturnal hypoxemia   . Obesity   . Open toe wound 11/2015   Wound care clinic appt made and then canceled when toe improved.  . Other and unspecified angina pectoris   . PAD (peripheral artery disease) [place] 02/2016   Abnormal ABI's and waveforms: LE arterial duplex ordered for f/u as per cardiologist's recommendation.  Vasc eval by Dr. CBridgett Larsson impression was minimal PAD, recommended maximize med mgmt.  .Marland KitchenPAF (paroxysmal atrial fibrillation) ([place] 10/2014   xarelto  . Pericardial effusion with cardiac tamponade 6//29/15   pericardiocentesis was done,  Infectious/inflamm (cytology showed NO MALIGNANT CELLS)  . Pleural plaque    Pleural plaques/asbestosis changes on CT chest done by pulm 05/2016.  .Marland KitchenPneumonia   . Tobacco dependence in remission    100+ pack-yr hx: quit after CABG    Past Surgical History:  Procedure Laterality Date  . APPENDECTOMY  1957  . CARDIAC CATHETERIZATION  08/08/2013  . CATARACT EXTRACTION W/ INTRAOCULAR LENS  IMPLANT, BILATERAL  04/08/2006 & 04/22/2006  . CATARACT EXTRACTION W/ INTRAOCULAR LENS  IMPLANT, BILATERAL Bilateral   . CHOLECYSTECTOMY OPEN  1999  . COLONOSCOPY  10/16/2011   Procedure:  COLONOSCOPY;  Surgeon: Inda Castle, MD;  Location: [facility];  Service: Endoscopy;  Laterality: N/A;  . CORONARY ARTERY BYPASS GRAFT N/A 08/14/2013   Procedure: CORONARY ARTERY BYPASS  GRAFTING (CABG);  Surgeon: Melrose Nakayama, MD;  Location: [place];  Service: Open Heart Surgery;  Laterality: N/A;  Times 4   using left internal mammary artery and endoscopically harvested bilateral saphenous vein  . ESOPHAGOGASTRODUODENOSCOPY  02/25/12   Atrophic gastritis with a few erosions--capsule endoscopy planned as of 02/25/12 (Dr. Deatra Ina).  [name] HARDWARE REMOVAL Right 08/12/2012   Procedure: HARDWARE REMOVAL;  Surgeon: Mcarthur Rossetti, MD;  Location: [facility];  Service: Orthopedics;  Laterality: Right;  . HIP SURGERY Right 1954   Repair of slipped capital femoral epiphysis.  . INTRAOPERATIVE TRANSESOPHAGEAL ECHOCARDIOGRAM N/A 08/14/2013   Normal LV function. Procedure: INTRAOPERATIVE TRANSESOPHAGEAL ECHOCARDIOGRAM;  Surgeon: Melrose Nakayama, MD;  Location: [facility];  Service: Open Heart Surgery;  Laterality: N/A;  . LEFT HEART CATHETERIZATION WITH CORONARY ANGIOGRAM N/A 08/08/2013   Procedure: LEFT HEART CATHETERIZATION WITH CORONARY ANGIOGRAM;  Surgeon: Jettie Booze, MD;  Location: [facility];  Service: Cardiovascular;  Laterality: N/A;  . PATELLA FRACTURE SURGERY Left ~ 1979   bolt + 3 screws to repair tib plateau fx  . PERICARDIAL TAP N/A 07/01/2013   Procedure: PERICARDIAL TAP;  Surgeon: Jettie Booze, MD;  Location: [facility] C[facility] LAB;  Service: Cardiovascular;  Laterality: N/A;  . RIGHT/LEFT HEART CATH AND CORONARY ANGIOGRAPHY N/A 06/10/2016   Procedure: Right/Left Heart Cath and Coronary Angiography;  Surgeon: Larey Dresser, MD;  Location: [facility];  Service: Cardiovascular;  Laterality: N/A;  . TESTICLE SURGERY  as a child   Undescended testicle brought down into scrotum  . TONSILLECTOMY  1947  . TOTAL HIP ARTHROPLASTY Right 08/12/2012   Procedure: REMOVAL OF OLD PINS RIGHT HIP AND RIGHT TOTAL HIP ARTHROPLASTY ANTERIOR APPROACH;  Surgeon: Mcarthur Rossetti, MD;  Location: [facility];  Service: Orthopedics;  Laterality: Right;  . TRANSESOPHAGEAL ECHOCARDIOGRAM   09/22/2016   EF 40-45%; diffuse hypokinesis, Grd I DD.  [name] TRANSTHORACIC ECHOCARDIOGRAM  10/30/14   Mod LVH, EF 60-65%, normal wall motion, mod mitral regurg, mild PAH  . TRANSTHORACIC ECHOCARDIOGRAM  06/2016; 10/01/17   06/2016: EF 25-30%, global hypokinesis, grd II DD, vent septum motion changes c/w LBBB, mod MV regurg, L atr mod/sev dilat, R atr mod dilat, mod incr pulm press, Grd II DD.  09/2017: EF 50-55%, Hypokinesis of the basal inferolateral wall, grd I DD, mild MR, +LAE.    Family History  Problem Relation Age of Onset  . Heart disease Father   . Heart attack Father   . CVA Mother   . Hypertension Mother   . Diabetes Paternal Grandmother   . Breast cancer Sister   . Diabetes Maternal Uncle   . Heart disease Maternal Uncle   . Stroke Neg Hx     Social History:  reports that he has been smoking cigarettes. He has a 64.00 pack-year smoking history. He quit smokeless tobacco use about 4 years ago. He reports that he does not drink alcohol or use drugs.   Review of Systems  Constitutional: Negative for reduced appetite and malaise.  HENT: Negative for headaches and nasal congestion.   Eyes: Negative for blurred vision.  Respiratory: Negative for cough and shortness of breath.   Cardiovascular: Positive for leg swelling. Negative for chest pain and palpitations.  Gastrointestinal: Negative for vomiting and diarrhea.  Endocrine: Positive  for abnormal weight gain.  Genitourinary: Positive for dysuria.       While on diuretics  Skin: Negative for rash and itching.  Allergic/Immunologic: Negative for rhinorrhea.  Neurological: Negative for weakness and tremors.     LABS:  No visits with results within 1 Week(s) from this visit.  Latest known visit with results is:  Office Visit on 10/19/2017  Component Date Value Ref Range Status  . POC Glucose 10/19/2017 212* 70 - 99 mg/dl Final    Physical Examination:  PHYSICAL EXAM: VS: BP 110/70 (BP Location: Right Arm)   Pulse 65    Ht _0  (1.803 m)   Wt 103.7 kg   SpO2 95%   BMI 31.88 kg/m    EXAM: General: Pt appears well and is in NAD  Hydration: Well-hydrated with with moist mucous membranes and good skin turgor  Eyes: External eye exam normal without stare, lid lag or exophthalmos.  EOM intact.  PERRL.  Ears, Nose, Throat: Hearing: grossly intact bilaterally Dental: Good dentition  Throat: Clear without mass, erythema or exudate  Neck: General: Supple without adenopathy. Thyroid: Thyroid size normal.  No goiter or nodules appreciated. No thyroid bruit.  Lungs: Clear with good BS bilat with no rales, rhonchi, or wheezes  Heart: Auscultation: RRR with normal S1 and S2, no gallops or murmurs Carotid arteries: no bruits Periph. circulation: no peripheral edema  Abdomen: Normoactive bowel sounds, soft, nontender, without masses or organomegaly palpable  Lymphatics:   Extremities:  BL LE: 1+ pretibial edema normal ROM and strength.  Skin: Hair: texture and amount normal with gender appropriate distribution Skin Inspection: no rashes, acanthosis nigricans/skin tags. Skin Palpation: skin temperature, texture, and thickness normal to palpation  Mental Status: Judgment, insight: intact Orientation: oriented to time, place, and person Mood and affect: no depression, anxiety, or agitation         ASSESSMENT/PLAN:  Type 2DM, poorly controlled,  with hypoglycemia unawareness:   -  Due to hypoglycemia unawareness, we have decided to relax his glucose goals for the next few weeks.  - He has been off the V-Go for a week. He is satisfied with his glucose readings so far.  - He did not have any daytime hypoglycemic episodes since being on the new regimen, but he continues with early morning hypoglycemic episodes with the lowest being 49 mg/dl(as opposed to 34 on last visit) - Based on the above we will reduce his insulin dose for supper time as below - We did discuss the benefit of using the dedicated U-500  syringe but he would like to stick with the regular U-100 syringe as he has plenty at home.  - We discussed the risk of errors is less the the dedicated U-500 syringe.  - Reviewed rule15 for hypoglycemia - Patient advised to call the office with BG's < 70 or > 300 mg/dL.     Recommendations:  - Continue Humulin- R U-500 at 50 units with Breakfast and Lunch (Patient advised to pull the syringe to #10)  - Decrease Humulin- R U-500 to 42 units with Supper (patient to pull syringe to # 8.5)    Influenza vaccine given   F/U in 2 months   Secundino Ginger [facility] 10/26/2017, 9:29 AM      Secundino Ginger Shamleffer

## 2017-10-26 NOTE — Patient Instructions (Addendum)
-  Continue Humulin U-500 at 50 units with Breakfast and Lunch (Pull to # 10) - Decrease Humulin-U 500 to 42 units ( Pull to # 8.5) - Continue checking your sugar before eat meal  - Take Humulin U-500 approximately 30 minutes before a meal     - HOW TO TREAT LOW BLOOD SUGARS (Blood sugar LESS THAN 70 MG/DL)  Please follow the RULE OF 15 for the treatment of hypoglycemia treatment (when your (blood sugars are less than 70 mg/dL)    STEP 1: Take 15 grams of carbohydrates when your blood sugar is low, which includes:   3-4 GLUCOSE TABS  OR  3-4 OZ OF JUICE OR REGULAR SODA OR  ONE TUBE OF GLUCOSE GEL     STEP 2: RECHECK blood sugar in 15 MINUTES STEP 3: If your blood sugar is still low at the 15 minute recheck --> then, go back to STEP 1 and treat AGAIN with another 15 grams of carbohydrates.

## 2017-10-28 ENCOUNTER — Other Ambulatory Visit: Payer: Self-pay | Admitting: Endocrinology

## 2017-10-28 ENCOUNTER — Other Ambulatory Visit: Payer: Self-pay | Admitting: Family Medicine

## 2017-10-28 ENCOUNTER — Ambulatory Visit (HOSPITAL_COMMUNITY)
Admission: RE | Admit: 2017-10-28 | Discharge: 2017-10-28 | Disposition: A | Discharge status: Home / Self Care | Payer: Medicare Other | Source: Ambulatory Visit | Attending: Internal Medicine | Admitting: Internal Medicine

## 2017-10-28 ENCOUNTER — Encounter (HOSPITAL_COMMUNITY): Payer: Self-pay

## 2017-10-28 ENCOUNTER — Other Ambulatory Visit: Payer: Self-pay | Admitting: Internal Medicine

## 2017-10-28 VITALS — BP 136/56 | HR 66 | Wt 221.0 lb

## 2017-10-28 DIAGNOSIS — F17210 Nicotine dependence, cigarettes, uncomplicated: Secondary | ICD-10-CM | POA: Insufficient documentation

## 2017-10-28 DIAGNOSIS — Z885 Allergy status to narcotic agent status: Secondary | ICD-10-CM | POA: Diagnosis not present

## 2017-10-28 DIAGNOSIS — J449 Chronic obstructive pulmonary disease, unspecified: Secondary | ICD-10-CM | POA: Diagnosis not present

## 2017-10-28 DIAGNOSIS — Z803 Family history of malignant neoplasm of breast: Secondary | ICD-10-CM | POA: Insufficient documentation

## 2017-10-28 DIAGNOSIS — E119 Type 2 diabetes mellitus without complications: Secondary | ICD-10-CM | POA: Insufficient documentation

## 2017-10-28 DIAGNOSIS — Z8249 Family history of ischemic heart disease and other diseases of the circulatory system: Secondary | ICD-10-CM | POA: Insufficient documentation

## 2017-10-28 DIAGNOSIS — Z794 Long term (current) use of insulin: Secondary | ICD-10-CM | POA: Insufficient documentation

## 2017-10-28 DIAGNOSIS — I255 Ischemic cardiomyopathy: Secondary | ICD-10-CM | POA: Diagnosis not present

## 2017-10-28 DIAGNOSIS — E785 Hyperlipidemia, unspecified: Secondary | ICD-10-CM | POA: Insufficient documentation

## 2017-10-28 DIAGNOSIS — I480 Paroxysmal atrial fibrillation: Secondary | ICD-10-CM

## 2017-10-28 DIAGNOSIS — Z951 Presence of aortocoronary bypass graft: Secondary | ICD-10-CM | POA: Insufficient documentation

## 2017-10-28 DIAGNOSIS — I5032 Chronic diastolic (congestive) heart failure: Secondary | ICD-10-CM

## 2017-10-28 DIAGNOSIS — Z79899 Other long term (current) drug therapy: Secondary | ICD-10-CM | POA: Diagnosis not present

## 2017-10-28 DIAGNOSIS — I5040 Unspecified combined systolic (congestive) and diastolic (congestive) heart failure: Secondary | ICD-10-CM | POA: Insufficient documentation

## 2017-10-28 DIAGNOSIS — I10 Essential (primary) hypertension: Secondary | ICD-10-CM

## 2017-10-28 DIAGNOSIS — E875 Hyperkalemia: Secondary | ICD-10-CM | POA: Diagnosis not present

## 2017-10-28 DIAGNOSIS — I2510 Atherosclerotic heart disease of native coronary artery without angina pectoris: Secondary | ICD-10-CM | POA: Diagnosis not present

## 2017-10-28 DIAGNOSIS — I110 Hypertensive heart disease with heart failure: Secondary | ICD-10-CM | POA: Insufficient documentation

## 2017-10-28 DIAGNOSIS — Z7901 Long term (current) use of anticoagulants: Secondary | ICD-10-CM | POA: Insufficient documentation

## 2017-10-28 LAB — BASIC METABOLIC PANEL
ANION GAP: 9 (ref 5–15)
BUN: 20 mg/dL (ref 8–23)
CO2: 29 mmol/L (ref 22–32)
Calcium: 8.8 mg/dL — ABNORMAL LOW (ref 8.9–10.3)
Chloride: 103 mmol/L (ref 98–111)
Creatinine, Ser: 0.99 mg/dL (ref 0.61–1.24)
GFR calc non Af Amer: >60 mL/min (ref >60)
GLUCOSE: 178 mg/dL — AB (ref 70–99)
POTASSIUM: 4.2 mmol/L (ref 3.5–5.1)
Sodium: 141 mmol/L (ref 135–145)

## 2017-10-28 MED ORDER — TORSEMIDE 20 MG PO TABS: ORAL_TABLET | ORAL | 6 refills | Status: DC

## 2017-10-28 NOTE — Progress Notes (Signed)
Patient completed SDOH screening in clinic- no social concerns at this time other than chronic tobacco use  CSW will continue to follow in clinic and assist as needed  Jorge Ny, [place] Worker [facility] [phone]

## 2017-10-28 NOTE — Addendum Note (Signed)
Encounter addended by: [name], LPN on: 34/96/1164 3:53 AM  Actions taken: Order list changed

## 2017-10-28 NOTE — Addendum Note (Signed)
Encounter addended by: Jorge Ny, LCSW on: 10/28/2017 10:47 AM  Actions taken: Visit Navigator Flowsheet section accepted, Sign clinical note

## 2017-10-28 NOTE — Patient Instructions (Addendum)
Today you have been seen at the Heart failure clinic at [facility]   Medication changes: torsemide take 20 mg every other day   Lab work done today: BMET  We will call you if your lab work is abnormal.. No news is good news!!   Follow up with Dr. [name] on 01/31/18 at 9:00am  Do the following things EVERYDAY: 1) Weigh yourself in the morning before breakfast. Write it down and keep it in a log. 2) Take your medicines as prescribed 3) Eat low salt foods-Limit salt (sodium) to 2000 mg per day.  4) Stay as active as you can everyday 5) Limit all fluids for the day to less than 2 liters

## 2017-10-28 NOTE — Progress Notes (Signed)
Advanced Heart Failure Clinic Note   Primary Care: Dr. Anitra Lauth Primary Cardiologist: Dr. [name]  HPI:  Joshua [name] is a 77 y.o. 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 Unable to tolerate Entresto due to lightheadedness/hypotension.   Admitted 5/31 -> 06/11/16 with worsening SOB, volume overload, and increased 02 demand. Echo showed EF down to 25-30%.  Diuresed on IV lasix and given solumedrol. Thought to be mixed AECOPD and acute CHF. Moved to stepdown 06/05/16 with worsening resp status and BiPAP requirement. Improved with nebs, steroids, ABX and IV lasix + metolazone. Sent home on Apex and prednisone taper. Overall diuresed 22 lbs. He had left and right heart cath with patent grafts and no interventional target.  Discharge weight 233 lbs.   Pt admitted from 6/11 -> 06/17/16 with syncope (unwitnessed) Noted to have fall x 3. EKG with LBBB. Creatinine mildly elevated. CT head negative for acute process. R hip xray negative for fracture or dislocated hardware.  Felt to be vasovagal. Received NS and diuretics held up to discharge.  Creatinine improved.  Admitted 09/30/17 with weakness and bradycardia. Had AKI and hyperkalemia. Bisoprolol, lisinorpil, digoxin, and spironolactone stopped. ECHO completed and showed EF 50-55% with grade IDD. Discharged on 09/30/2017 with weight 216 pounds.   Today he returns for HF follow up. Last visit bisoprolol 2.5 mg started and lokelma started for hyperkalemia. Complaining of fatigue. Denies SOB/PND/Orthopnea. Ne chest discomfort. NO bleeding problems. Walks with a walker. Good appetite. Weight at home 221-225 pounds. No bleeding problems. Taking all medications.   Labs (8/18): K 4.7, creatinine 1.05 Labs (9/18): K 4.3, creatinine 1.44 Labs (10/18): K 4.9, creatinine 0.99 Labs (11/18): K 4.5, creatinine 1.32, digoxin 0.3 Labs (4/19): TGs 397, LDL 47, K 4.4, creatinine 1.01 Labs (10/15/2017): K 4.9 Creatinine  10.8    PMH:  1. CAD: s/p CABG in 8/15.  - LHC (6/18) with 80% distal LM, TO pLAD, TO ostial LCx, subtotal occlusion distal RCA.  LIMA-LAD patent, Y graft to OM and D patent (50% stenosis in graft at touchdown on OM), patent SVG-PDA.  2. Chronic systolic CHF: Ischemic cardiomyopathy.  Echo (6/18) with EF 25-30%, diffuse HK, moderate MR, mildly dilated RV with mildly decreased systolic function, PASP 48 mmHg.  - RHC (6/18): mean RA 10, PA 45/26, mean PCWP 21, CI 2.11 (Fick) and 2.41 (thermo), PVR 2.9 WU.  - Echo (9/18): EF 40-45%, septal-lateral dyssynchrony, diffuse hypokinesis, normal RV size and systolic function.  3. COPD: Prior smoker.   4. Hyperlipidemia 5. Atrial fibrillation: Paroxysmal.  6. Type II diabetes 7. HTN 8. Anemia: h/o Fe deficiency 9. 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  10. H/o pericardial effusion with tamponade in 6/15.  11. HTN 12. LBBB  Review of systems complete and found to be negative unless listed in HPI.     Current Outpatient Medications  Medication Sig Dispense Refill  . ACCU-CHEK GUIDE test strip USE TO CHECK BLOOD SUGAR 3 TIMES DAILY 100 each 11  . acetaminophen (TYLENOL) 325 MG tablet Take 2 tablets (650 mg total) by mouth every 4 (four) hours as needed for headache or mild pain. (Patient taking differently: Take 500 mg by mouth every 4 (four) hours as needed for headache or mild pain. )    . Ascorbic Acid (VITAMIN C) 1000 MG tablet Take 1,000 mg by mouth daily.    [name] atorvastatin (LIPITOR) 40 MG tablet Take 40 mg by  mouth daily at 6 PM.     . bisoprolol (ZEBETA) 5 MG tablet Take 0.5 tablets (2.5 mg total) by mouth daily. 15 tablet 11  . Cholecalciferol (VITAMIN D-3) 1000 units CAPS Take 1,000 Units by mouth daily. Reported on 02/26/2015    . fluticasone (FLONASE) 50 MCG/ACT nasal spray Place 2 sprays into both nostrils daily. (Patient taking differently: Place 2 sprays into both nostrils as needed. ) 16 g 1  .  gabapentin (NEURONTIN) 100 MG capsule TAKE TWO CAPSULES BY MOUTH THREE TIMES DAILY (Patient taking differently: Take 200 mg by mouth 2 (two) times daily. ) 180 capsule 11  . guaiFENesin (MUCINEX) 600 MG 12 hr tablet Take 2 tablets (1,200 mg total) by mouth 2 (two) times daily. 30 tablet 0  . hyoscyamine (LEVSIN SL) 0.125 MG SL tablet TAKE 1 TO 2 TABLETS BY MOUTH EVERY 6 HOURS AS NEEDED FOR DIARRHEA (Patient taking differently: Take 0.125-0.25 mg by mouth as needed for cramping. AS NEEDED FOR DIARRHEA) 60 tablet 3  . insulin regular human CONCENTRATED (HUMULIN R) 500 UNIT/ML injection INJECT 56 UNITS SUBCUTANEOUSLY DAILY IN INSULIN PUMP 20 mL 0  . mirtazapine (REMERON) 45 MG tablet Take 1 tablet (45 mg total) by mouth at bedtime. 30 tablet 5  . Multiple Vitamin (MULTIVITAMIN WITH MINERALS) TABS Take 1 tablet by mouth daily.    . nitroGLYCERIN (NITROSTAT) 0.4 MG SL tablet PLACE 1 TABLET UNDER THE TONGUE EVERY 5 MINUTES AS NEEDED FOR CHEST PAIN (Patient taking differently: Place 0.4 mg under the tongue every 5 (five) minutes as needed for chest pain. ) 25 tablet 3  . PROAIR HFA 108 (90 Base) MCG/ACT inhaler INHALE 2 PUFFS INTO THE LUNGS EVERY 4 HOURS AS NEEDED FOR WHEEZING ORSHORTNESS OF BREATH (Patient taking differently: Inhale 2 puffs into the lungs every 4 (four) hours as needed for wheezing or shortness of breath. ) 8.5 g 1  . sertraline (ZOLOFT) 25 MG tablet Take 2 tablets (50 mg total) by mouth at bedtime. 60 tablet 5  . sodium zirconium cyclosilicate (LOKELMA) 10 g PACK packet Take 10 g by mouth daily. 30 packet 11  . SYMBICORT 160-4.5 MCG/ACT inhaler INHALE 2 PUFFS INTO THE LUNGS TWICE DAILY (Patient taking differently: Inhale 2 puffs into the lungs 2 (two) times daily. ) 10.2 g 5  . SYNJARDY 05-998 MG TABS TAKE ONE TABLET BY MOUTH TWICE DAILY AFTER A MEAL (Patient taking differently: Take 1 tablet by mouth 2 (two) times daily. ) 60 tablet 2  . tamsulosin (FLOMAX) 0.4 MG CAPS capsule TAKE 1 CAPSULE  BY MOUTH EVERY DAY AFTER SUPPER (Patient taking differently: Take 0.4 mg by mouth daily after supper. ) 90 capsule 1  . torsemide (DEMADEX) 20 MG tablet Take 20 mg daily as needed for increase weight of 2 to 3 pounds in 24 hours. 60 tablet 6  . isosorbide mononitrate (IMDUR) 30 MG 24 hr tablet TAKE ONE TABLET BY MOUTH EVERY DAY (Patient taking differently: Take 30 mg by mouth daily. Half a tablet a day) 90 tablet 1  . lipase/protease/amylase (CREON) 36000 UNITS CPEP capsule Take 2 capsules (72,000 Units total) by mouth 3 (three) times daily with meals. 780 capsule 3  . VASCEPA 1 g CAPS TAKE TWO CAPSULES BY MOUTH TWICE DAILY 120 capsule 2  . XARELTO 20 MG TABS tablet TAKE ONE TABLET BY MOUTH DAILY WITH SUPPER 30 tablet 11   No current facility-administered medications for this encounter.     Allergies  Allergen Reactions  .  Morphine And Related Other (See Comments)    Drenched with perspiration  . Entresto [Sacubitril-Valsartan]     hypotension  . Demerol [Meperidine] Nausea Only  . Starlix [Nateglinide] Other (See Comments)    gassy      Social History   Socioeconomic History  . Marital status: Married    Spouse name: [name]  . Number of children: 4  . Years of education: Not on file  . Highest education level: Not on file  Occupational History  . Occupation: retired, former Scientist, forensic  Social Needs  . Financial resource strain: Not on file  . Food insecurity:    Worry: Not on file    Inability: Not on file  . Transportation needs:    Medical: Not on file    Non-medical: Not on file  Tobacco Use  . Smoking status: Current Every Day Smoker    Packs/day: 1.00    Years: 64.00    Pack years: 64.00    Types: Cigarettes    Last attempt to quit: 10/23/2015    Years since quitting: 2.0  . Smokeless tobacco: Former Systems developer    Quit date: 06/30/2013  Substance and Sexual Activity  . Alcohol use: No    Alcohol/week: 0.0 standard drinks  . Drug use: No  . Sexual activity: Never    Lifestyle  . Physical activity:    Days per week: Not on file    Minutes per session: Not on file  . Stress: Not on file  Relationships  . Social connections:    Talks on phone: Not on file    Gets together: Not on file    Attends religious service: Not on file    Active member of club or organization: Not on file    Attends meetings of clubs or organizations: Not on file    Relationship status: Not on file  . Intimate partner violence:    Fear of current or ex partner: Not on file    Emotionally abused: Not on file    Physically abused: Not on file    Forced sexual activity: Not on file  Other Topics Concern  . Not on file  Social History Narrative   Married, 4 children.   Formerly a Medical illustrator.   Level of education: HS.     +tobacco--lifelong/ quit 2015).  No alcohol or drugs.   No exercise.  +excessive caffeine.      Lives in 1 story home with his wife and her sister      Family History  Problem Relation Age of Onset  . Heart disease Father   . Heart attack Father   . CVA Mother   . Hypertension Mother   . Diabetes Paternal Grandmother   . Breast cancer Sister   . Diabetes Maternal Uncle   . Heart disease Maternal Uncle   . Stroke Neg Hx     Vitals:   10/28/17 0913  BP: (!) 136/56  Pulse: 66  SpO2: 94%  Weight: 100.2 kg (221 lb)   Wt Readings from Last 3 Encounters:  10/28/17 100.2 kg (221 lb)  10/26/17 103.7 kg (228 lb 9.6 oz)  10/19/17 102.5 kg (226 lb)     PHYSICAL EXAM: General:  Well appearing. No resp difficulty. Walked in the clinic with a rolling walker.  HEENT: normal Neck: supple. no JVD. Carotids 2+ bilat; no bruits. No lymphadenopathy or thryomegaly appreciated. Cor: PMI nondisplaced. Regular rate & rhythm. No rubs, gallops or murmurs. Lungs: clear Abdomen:  soft, nontender, nondistended. No hepatosplenomegaly. No bruits or masses. Good bowel sounds. Extremities: no cyanosis, clubbing, rash, edema Neuro: alert &  orientedx3, cranial nerves grossly intact. moves all 4 extremities w/o difficulty. Affect pleasant  ASSESSMENT & PLAN: 1. Combined Diastolic/Systolic CHF: Ischemic cardiomyopathy.  ECHO EF 50-55% with Grade I DD.  -NYHA II. Appears dry today. Change torsemide to every other day.  - Continue  2.5 mg bisoprolol daily.  - Check BMET  No arb/spiro with hyperkalemia. 2. COPD: No longer smoking.  Not using home oxygen at this time. No wheeze  3. CAD s/p CABG: Cath 06/10/16 with severe 3 vessel native disease with patent bypass grafts. No interventional target.  - Continue statin.  With stable CAD and Xarelto use, does not need to be on ASA.  - On low dose bb.  -No s/s ischemia  4. Atrial fibrillation: Paroxysmal.  -regular pulse  - Continue Xarelto.  5. Type II diabetes:  6. Hyperkalemia Check BMET  On lokelma daily 7. HTN Elevated. Restart bisoprolol.   Follow up 3-4 months.  Amy Clegg NP-C  9:18 AM

## 2017-11-10 ENCOUNTER — Other Ambulatory Visit: Payer: Self-pay

## 2017-11-10 ENCOUNTER — Ambulatory Visit (INDEPENDENT_AMBULATORY_CARE_PROVIDER_SITE_OTHER): Payer: Medicare Other | Admitting: Family Medicine

## 2017-11-10 ENCOUNTER — Encounter: Payer: Self-pay | Admitting: *Deleted

## 2017-11-10 ENCOUNTER — Telehealth: Payer: Self-pay | Admitting: Gastroenterology

## 2017-11-10 ENCOUNTER — Encounter: Payer: Self-pay | Admitting: Family Medicine

## 2017-11-10 VITALS — BP 128/53 | HR 57 | Temp 98.1°F | Resp 16 | Ht 71.0 in | Wt 226.0 lb

## 2017-11-10 DIAGNOSIS — J9611 Chronic respiratory failure with hypoxia: Secondary | ICD-10-CM

## 2017-11-10 DIAGNOSIS — J449 Chronic obstructive pulmonary disease, unspecified: Secondary | ICD-10-CM | POA: Diagnosis not present

## 2017-11-10 DIAGNOSIS — I5042 Chronic combined systolic (congestive) and diastolic (congestive) heart failure: Secondary | ICD-10-CM | POA: Diagnosis not present

## 2017-11-10 MED ORDER — PANCRELIPASE (LIP-PROT-AMYL) 36000-114000 UNITS PO CPEP: 72000.0000 [IU] | ORAL_CAPSULE | Freq: Three times a day (TID) | ORAL | 3 refills | Status: DC

## 2017-11-10 NOTE — Progress Notes (Addendum)
OFFICE VISIT  11/10/2017   CC:  Chief Complaint  Patient presents with  . Oxygen Recert    HPI:    Patient is a 77 y.o. Caucasian male who presents for f/u chronic hypoxic resp failure. He has paperwork to fill out so he can get home oxygen recertification.   Pt has combined Diastolic/Systolic CHF: Ischemic cardiomyopathy.  ECHO 10/01/17--> EF 50-55% with Grade I DD. Additionally, he has COPD. He was last certified to get home oxygen at a flow rate of 2L/min 10/2016. His goal oxygen saturation is 88-92% per pulmonology.  RA sat at rest today 98%. RA sat while walking and after walking 100 yards in office today= 97% He has not had to wear oxygen AT ALL at home in "quite some time".  He checks pulse ox daily, always mid 90s or better.  On a typical day for him, he gets DOE at about 100 feet and rests and recoups in about 4-5 min. He is doing well on torsemide qod. He is taking bisoprolol 2.69m qd. Home bp avg 140s/60s, HR low 60s.  HOme oxygen sat on RA this morning was 95%. Has a little bit of clear phlegm with brief coughing every day.  No wheezing lately.    Past Medical History:  Diagnosis Date  . Acute respiratory distress 06/04/2016  . Adenomatous colon polyp 10/16/2011   Repeat 2018  . Arthritis     Hips, R>L & KNEES  . CAD, multiple vessel    3V CAD cath 08/08/13----CABG done shortly after.  Cath 06/2016: clear bipasses/no interventional lesion.  . 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 pylori neg, no dysplasia or malignancy.  . Chronic combined systolic and diastolic CHF, NYHA class 2 ([facility] 2016-[place]   Followed by Advanced CHF clinic.  Transesoph Echo 09/2016 EF improved to 40-45%; diffuse hypokinesis, Grd I DD.  09/2017 EF 50-55%, grd I DD.  .Marland KitchenChronic renal insufficiency, stage 3 (moderate) (HCC) 2018   GFR 50s  . COPD (chronic obstructive pulmonary disease) ([facility]    GOLD II.  Spirometry  2004 borderline obstruction; 2015 mod obst:  noncompliant with bid symbicort so pulmonologist switched him to a once daily inhaler: Trelegy ellipta 12/2015.  Oxygen prn: goal 88-92%.  . Diabetic nephropathy ([place]    Elevated urine microalb/cr 03/2011  . DM type 2 (diabetes mellitus, type 2) ([facility]    Poor control on max oral meds--pt eventually agreed to insulin therapy.  As of 2017 his DM is being managed by Dr. KDwyane Deein endo.  On insulin pump as of 04/2017 endo f/u.  .Marland KitchenDOE (dyspnea on exertion)    COPD + chronic diastolic HF  . Dysthymia    Worsened after 2015 heart surgery.  Memory loss +? as well.  Neuro eval 01/2017--plan is to get MRI brain, B12 level, and neuropsychiatric testing.  . Erectile dysfunction    Normal testosterone  . Hearing loss of both ears 2016   Hearing aids  . Hyperplastic colon polyp 2001   No further colonoscopies to be done as of 11/2016 GI eval.  . Hypertension   . IBS (irritable bowel syndrome)    responds relatively well to prn levsin.  Pepto bismol q AM trial by Dr. DLoletha Carrow8/2019, also checking fecal elastace and lactoferrin.  . Iron deficiency anemia 2014   03/2012 capsule endoscopy showed 2 AVMs--likely responsible for his IDA--lifetime iron supp recommended + q63moBCs.  . Ischemic cardiomyopathy 06/2016; 09/2016   06/2016: EF  25-30%, global hypokinesis, grd II DD, vent septum motion changes c/w LBBB, mod MV regurg, L atr mod/sev dilat, R atr mod dilat, mod incr pulm press, Grd II DD.  09/2016--EF 40-45%; diffuse hypokinesis, Grd I DD.  . Macular degeneration, dry    Mild, bilat (Optometrist, Mayford Knife at [facility] in [place], [place])  . Mild cognitive impairment    MCI, likley vascular etiology, + MDD, recurrent-->neuropsych testing 06/2017.  Neurol to repeat testing 1 yr  . Mixed hyperlipidemia    statin and vascepa as of 04/2017  . Nocturnal hypoxemia   . Obesity   . Open toe wound 11/2015   Wound care clinic appt made and then canceled when toe improved.  . Other and unspecified angina  pectoris   . PAD (peripheral artery disease) ([place]) 02/2016   Abnormal ABI's and waveforms: LE arterial duplex ordered for f/u as per cardiologist's recommendation.  Vasc eval by Dr. Bridgett Larsson: impression was minimal PAD, recommended maximize med mgmt.  [name] PAF (paroxysmal atrial fibrillation) ([place]) 10/2014   xarelto  . Pericardial effusion with cardiac tamponade 6//29/15   pericardiocentesis was done,  Infectious/inflamm (cytology showed NO MALIGNANT CELLS)  . Pleural plaque    Pleural plaques/asbestosis changes on CT chest done by pulm 05/2016.  [name] Pneumonia   . Tobacco dependence in remission    100+ pack-yr hx: quit after CABG    Past Surgical History:  Procedure Laterality Date  . APPENDECTOMY  1957  . CARDIAC CATHETERIZATION  08/08/2013  . CATARACT EXTRACTION W/ INTRAOCULAR LENS  IMPLANT, BILATERAL  04/08/2006 & 04/22/2006  . CATARACT EXTRACTION W/ INTRAOCULAR LENS  IMPLANT, BILATERAL Bilateral   . CHOLECYSTECTOMY OPEN  1999  . COLONOSCOPY  10/16/2011   Procedure: COLONOSCOPY;  Surgeon: Inda Castle, MD;  Location: [facility];  Service: Endoscopy;  Laterality: N/A;  . CORONARY ARTERY BYPASS GRAFT N/A 08/14/2013   Procedure: CORONARY ARTERY BYPASS GRAFTING (CABG);  Surgeon: Melrose Nakayama, MD;  Location: [facility];  Service: Open Heart Surgery;  Laterality: N/A;  Times 4   using left internal mammary artery and endoscopically harvested bilateral saphenous vein  . ESOPHAGOGASTRODUODENOSCOPY  02/25/12   Atrophic gastritis with a few erosions--capsule endoscopy planned as of 02/25/12 (Dr. Deatra Ina).  [name] HARDWARE REMOVAL Right 08/12/2012   Procedure: HARDWARE REMOVAL;  Surgeon: Mcarthur Rossetti, MD;  Location: [facility];  Service: Orthopedics;  Laterality: Right;  . HIP SURGERY Right 1954   Repair of slipped capital femoral epiphysis.  . INTRAOPERATIVE TRANSESOPHAGEAL ECHOCARDIOGRAM N/A 08/14/2013   Normal LV function. Procedure: INTRAOPERATIVE TRANSESOPHAGEAL ECHOCARDIOGRAM;  Surgeon: Melrose Nakayama, MD;  Location: [facility];  Service: Open Heart Surgery;  Laterality: N/A;  . LEFT HEART CATHETERIZATION WITH CORONARY ANGIOGRAM N/A 08/08/2013   Procedure: LEFT HEART CATHETERIZATION WITH CORONARY ANGIOGRAM;  Surgeon: Jettie Booze, MD;  Location: [facility] C[facility] LAB;  Service: Cardiovascular;  Laterality: N/A;  . PATELLA FRACTURE SURGERY Left ~ 1979   bolt + 3 screws to repair tib plateau fx  . PERICARDIAL TAP N/A 07/01/2013   Procedure: PERICARDIAL TAP;  Surgeon: Jettie Booze, MD;  Location[facility] CATH LAB;  Service: Cardiovascular;  Laterality: N/A;  . RIGHT/LEFT HEART CATH AND CORONARY ANGIOGRAPHY N/A 06/10/2016   Procedure: Right/Left Heart Cath and Coronary Angiography;  Surgeon: Larey Dresser, MD;  Location: [facility] CV LAB;  Service: Cardiovascular;  Laterality: N/A;  . TESTICLE SURGERY  as a child   Undescended testicle brought down into scrotum  .  TONSILLECTOMY  1947  . TOTAL HIP ARTHROPLASTY Right 08/12/2012   Procedure: REMOVAL OF OLD PINS RIGHT HIP AND RIGHT TOTAL HIP ARTHROPLASTY ANTERIOR APPROACH;  Surgeon: Mcarthur Rossetti, MD;  Location: [facility];  Service: Orthopedics;  Laterality: Right;  . TRANSESOPHAGEAL ECHOCARDIOGRAM  09/22/2016   EF 40-45%; diffuse hypokinesis, Grd I DD.  [name] TRANSTHORACIC ECHOCARDIOGRAM  10/30/14   Mod LVH, EF 60-65%, normal wall motion, mod mitral regurg, mild PAH  . TRANSTHORACIC ECHOCARDIOGRAM  06/2016; 10/01/17   06/2016: EF 25-30%, global hypokinesis, grd II DD, vent septum motion changes c/w LBBB, mod MV regurg, L atr mod/sev dilat, R atr mod dilat, mod incr pulm press, Grd II DD.  09/2017: EF 50-55%, Hypokinesis of the basal inferolateral wall, grd I DD, mild MR, +LAE.    Outpatient Medications Prior to Visit  Medication Sig Dispense Refill  . ACCU-CHEK GUIDE test strip USE TO CHECK BLOOD SUGAR 3 TIMES DAILY 100 each 11  . acetaminophen (TYLENOL) 325 MG tablet Take 2 tablets (650 mg total) by mouth every 4 (four) hours as needed for  headache or mild pain. (Patient taking differently: Take 500 mg by mouth every 4 (four) hours as needed for headache or mild pain. )    . Ascorbic Acid (VITAMIN C) 1000 MG tablet Take 1,000 mg by mouth daily.    [name] atorvastatin (LIPITOR) 40 MG tablet Take 40 mg by mouth daily at 6 PM.     . bisoprolol (ZEBETA) 5 MG tablet Take 0.5 tablets (2.5 mg total) by mouth daily. 15 tablet 11  . Cholecalciferol (VITAMIN D-3) 1000 units CAPS Take 1,000 Units by mouth daily. Reported on 02/26/2015    . fluticasone (FLONASE) 50 MCG/ACT nasal spray Place 2 sprays into both nostrils daily. (Patient taking differently: Place 2 sprays into both nostrils as needed. ) 16 g 1  . gabapentin (NEURONTIN) 100 MG capsule TAKE TWO CAPSULES BY MOUTH THREE TIMES DAILY (Patient taking differently: Take 200 mg by mouth 2 (two) times daily. ) 180 capsule 11  . guaiFENesin (MUCINEX) 600 MG 12 hr tablet Take 2 tablets (1,200 mg total) by mouth 2 (two) times daily. 30 tablet 0  . hyoscyamine (LEVSIN SL) 0.125 MG SL tablet TAKE 1 TO 2 TABLETS BY MOUTH EVERY 6 HOURS AS NEEDED FOR DIARRHEA (Patient taking differently: Take 0.125-0.25 mg by mouth as needed for cramping. AS NEEDED FOR DIARRHEA) 60 tablet 3  . insulin regular human CONCENTRATED (HUMULIN R) 500 UNIT/ML injection INJECT 56 UNITS SUBCUTANEOUSLY DAILY IN INSULIN PUMP 20 mL 0  . isosorbide mononitrate (IMDUR) 30 MG 24 hr tablet TAKE ONE TABLET BY MOUTH EVERY DAY (Patient taking differently: Take 30 mg by mouth daily. Half a tablet a day) 90 tablet 1  . lipase/protease/amylase (CREON) 36000 UNITS CPEP capsule Take 2 capsules (72,000 Units total) by mouth 3 (three) times daily with meals. 780 capsule 3  . mirtazapine (REMERON) 45 MG tablet Take 1 tablet (45 mg total) by mouth at bedtime. 30 tablet 5  . Multiple Vitamin (MULTIVITAMIN WITH MINERALS) TABS Take 1 tablet by mouth daily.    . nitroGLYCERIN (NITROSTAT) 0.4 MG SL tablet PLACE 1 TABLET UNDER THE TONGUE EVERY 5 MINUTES AS  NEEDED FOR CHEST PAIN (Patient taking differently: Place 0.4 mg under the tongue every 5 (five) minutes as needed for chest pain. ) 25 tablet 3  . PROAIR HFA 108 (90 Base) MCG/ACT inhaler INHALE 2 PUFFS INTO THE LUNGS EVERY 4 HOURS AS NEEDED FOR WHEEZING ORSHORTNESS OF  BREATH (Patient taking differently: Inhale 2 puffs into the lungs every 4 (four) hours as needed for wheezing or shortness of breath. ) 8.5 g 1  . sertraline (ZOLOFT) 25 MG tablet Take 2 tablets (50 mg total) by mouth at bedtime. 60 tablet 5  . sodium zirconium cyclosilicate (LOKELMA) 10 g PACK packet Take 10 g by mouth daily. 30 packet 11  . SYMBICORT 160-4.5 MCG/ACT inhaler INHALE 2 PUFFS INTO THE LUNGS TWICE DAILY (Patient taking differently: Inhale 2 puffs into the lungs 2 (two) times daily. ) 10.2 g 5  . SYNJARDY 05-998 MG TABS TAKE ONE TABLET BY MOUTH TWICE DAILY AFTER A MEAL (Patient taking differently: Take 1 tablet by mouth 2 (two) times daily. ) 60 tablet 2  . tamsulosin (FLOMAX) 0.4 MG CAPS capsule TAKE ONE CAPSULE BY MOUTH DAILY AFTER SUPPER 90 capsule 1  . torsemide (DEMADEX) 20 MG tablet Take 20 mg every other day 60 tablet 6  . VASCEPA 1 g CAPS TAKE TWO CAPSULES BY MOUTH TWICE DAILY 120 capsule 2  . XARELTO 20 MG TABS tablet TAKE ONE TABLET BY MOUTH DAILY WITH SUPPER 30 tablet 11   No facility-administered medications prior to visit.     Allergies  Allergen Reactions  . Morphine And Related Other (See Comments)    Drenched with perspiration  . Entresto [Sacubitril-Valsartan]     hypotension  . Demerol [Meperidine] Nausea Only  . Starlix [Nateglinide] Other (See Comments)    gassy    ROS As per HPI  PE: Blood pressure (!) 128/53, pulse (!) 57, temperature 98.1 F (36.7 C), temperature source Oral, resp. rate 16, height _0  (1.803 m), weight 226 lb (102.5 kg), SpO2 98 %. Gen: Alert, well appearing.  Patient is oriented to person, place, time, and situation. AFFECT: pleasant, lucid thought and  speech. CV: RRR, no m/r/g.   LUNGS: CTA bilat, nonlabored resps, good aeration in all lung fields.  Mildly prolonged exp phase. EXT: no clubbing or cyanosis.  no edema.     LABS:  Lab Results  Component Value Date   WBC 10.8 (H) 10/05/2017   HGB 14.8 10/05/2017   HCT 46.4 10/05/2017   MCV 87.7 10/05/2017   PLT 168 10/05/2017     Chemistry      Component Value Date/Time   NA 141 10/28/2017 1002   K 4.2 10/28/2017 1002   CL 103 10/28/2017 1002   CO2 29 10/28/2017 1002   BUN 20 10/28/2017 1002   CREATININE 0.99 10/28/2017 1002   CREATININE 1.22 (H) 01/11/2017 1018      Component Value Date/Time   CALCIUM 8.8 (L) 10/28/2017 1002   ALKPHOS 63 09/30/2017 1237   AST 18 09/30/2017 1237   ALT 16 09/30/2017 1237   BILITOT 0.4 09/30/2017 1237       IMPRESSION AND PLAN:  1) COPD and chronic combined systolic and diastolic HF-->hx of chronic hypoxic resp failure but at this time when he is at his baseline he no longer needs any oxygen supplementation.  OK for HH to pick up his oxygen from his home. He is euvolemic, doing well on current CHF and COPD regimens at this time. No labs due at this time.  BP a little elevated systolic at home and here, but diastolic lowest end of normal and HR low 60s so I'll hold off on up-titration of bp meds or addition of any new bp med today.  An After Visit Summary was printed and given to the patient.  FOLLOW UP: Return for keep f/u scheduled with me 12/2017 for CPE.  Signed:  Crissie Sickles, MD           11/10/2017

## 2017-11-10 NOTE — Telephone Encounter (Signed)
RX refilled as prescribed in Oct 2019.

## 2017-11-10 NOTE — Telephone Encounter (Signed)
Pt called to request prescription for creon sent to [facility] in [place], Pt stated that he was given sample at the office and it works.

## 2017-11-17 ENCOUNTER — Telehealth: Payer: Self-pay | Admitting: Psychology

## 2017-11-17 NOTE — Telephone Encounter (Signed)
Dr Si Raider is leaving our office to take a job closer to where she lives. We have mailed a letter to patient to let her know we have canceled all appointments with [facility]. Thank you for your understanding

## 2017-11-20 ENCOUNTER — Other Ambulatory Visit: Payer: Self-pay | Admitting: Endocrinology

## 2017-11-26 ENCOUNTER — Ambulatory Visit (INDEPENDENT_AMBULATORY_CARE_PROVIDER_SITE_OTHER): Payer: Medicare Other | Admitting: Family Medicine

## 2017-11-26 ENCOUNTER — Encounter: Payer: Self-pay | Admitting: Family Medicine

## 2017-11-26 VITALS — BP 143/68 | HR 70 | Temp 98.1°F | Resp 16 | Ht 71.0 in | Wt 237.2 lb

## 2017-11-26 DIAGNOSIS — E7800 Pure hypercholesterolemia, unspecified: Secondary | ICD-10-CM

## 2017-11-26 DIAGNOSIS — I255 Ischemic cardiomyopathy: Secondary | ICD-10-CM

## 2017-11-26 DIAGNOSIS — I10 Essential (primary) hypertension: Secondary | ICD-10-CM

## 2017-11-26 DIAGNOSIS — E669 Obesity, unspecified: Secondary | ICD-10-CM

## 2017-11-26 DIAGNOSIS — N183 Chronic kidney disease, stage 3 unspecified: Secondary | ICD-10-CM

## 2017-11-26 DIAGNOSIS — I5042 Chronic combined systolic (congestive) and diastolic (congestive) heart failure: Secondary | ICD-10-CM | POA: Diagnosis not present

## 2017-11-26 MED ORDER — BISOPROLOL FUMARATE 5 MG PO TABS: 5.0000 mg | ORAL_TABLET | Freq: Every day | ORAL | 11 refills | Status: AC

## 2017-11-26 NOTE — Patient Instructions (Signed)
Increase your bisoprolol to a WHOLE 5 mg tab once a day.

## 2017-11-26 NOTE — Progress Notes (Signed)
OFFICE VISIT  11/28/2017   CC:  Chief Complaint  Patient presents with  . Follow-up    RCI, pt is not fasting.      HPI:    Patient is a 77 y.o. Caucasian male who presents for 6 week f/u history of chronic hypoxic resp failure, CRI III, HTN, HLD. He has CAD and a-fib, has been asymptomatic. He as DM, managed by endo, is on insulin pump.  Resp failure: Pt has combined Diastolic/Systolic CHF: Ischemic cardiomyopathy.  ECHO 10/01/17--> EF 50-55% with Grade I DD. Additionally, he has COPD. He was last certified to get home oxygen at a flow rate of 2L/min 10/2016. When he came in last visit we did oxygen testing for recertification but his oxygen was fine so he did not qualify/did not need oxygen anymore. His goal oxygen saturation is 88-92% per pulmonology.  Interim Hx:  Wt is up 11 lbs over the last 6 wks. He takes torsemide 59m every other day.  However, he has not taken this med in the last 5d (missed his last 2 doses, plans on taking it today) due to having to be out of home a lot due to wife being in hospital.  No change in PO intake/diet lately.  Tries to eat low Na diet. Home oxygen sat 94% on RA, HR 68 this morning. Feels like breathing is at baseline.  Denies abd distention or increased swelling in LL's.  No wheezing or mucous production.  Taking symbicort only prn. Home bp monitoring: 145/68, HR 76-->most recent check about 3 d/a.  This is typical for him.   Past Medical History:  Diagnosis Date  . Acute respiratory distress 06/04/2016  . Adenomatous colon polyp 10/16/2011   Repeat 2018  . Arthritis     Hips, R>L & KNEES  . CAD, multiple vessel    3V CAD cath 08/08/13----CABG done shortly after.  Cath 06/2016: clear bipasses/no interventional lesion.  . 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 pylori neg, no dysplasia or malignancy.  . Chronic combined systolic and diastolic CHF, NYHA class 2 ([place] 2016-2018   Followed by [facility].  Transesoph Echo 09/2016 EF improved to 40-45%; diffuse hypokinesis, Grd I DD.  09/2017 EF 50-55%, grd I DD.  .Marland KitchenChronic hypoxemic respiratory failure (HCC)    HISTORY OF(2018): oxygen testing normal in office 11/10/17, no home oxygen was not recertified.  . Chronic renal insufficiency, stage 3 (moderate) ([place] 2018   GFR 50s  . COPD (chronic obstructive pulmonary disease) ([place]    GOLD II.  Spirometry  2004 borderline obstruction; 2015 mod obst: noncompliant with bid symbicort so pulmonologist switched him to a once daily inhaler: Trelegy ellipta 12/2015.  Oxygen prn: goal 88-92%.  . Diabetic nephropathy ([place]    Elevated urine microalb/cr 03/2011  . DM type 2 (diabetes mellitus, type 2) ([place]    Poor control on max oral meds--pt eventually agreed to insulin therapy.  As of 2017 his DM is being managed by Dr. KDwyane Deein endo.  On insulin pump as of 04/2017 endo f/u.  .Marland KitchenDOE (dyspnea on exertion)    COPD + chronic diastolic HF  . Dysthymia    Worsened after 2015 heart surgery.  Memory loss +? as well.  Neuro eval 01/2017--plan is to get MRI brain, B12 level, and neuropsychiatric testing.  . Erectile dysfunction    Normal testosterone  . Hearing loss of both ears 2016   Hearing aids  . Hyperplastic colon  polyp 2001   No further colonoscopies to be done as of 11/2016 GI eval.  . Hypertension   . IBS (irritable bowel syndrome)    responds relatively well to prn levsin.  Pepto bismol q AM trial by Dr. Loletha Carrow 08/2017, also checking fecal elastace and lactoferrin.  . Iron deficiency anemia 2014   03/2012 capsule endoscopy showed 2 AVMs--likely responsible for his IDA--lifetime iron supp recommended + q72moCBCs.  . Ischemic cardiomyopathy 06/2016; 09/2016   06/2016: EF 25-30%, global hypokinesis, grd II DD, vent septum motion changes c/w LBBB, mod MV regurg, L atr mod/sev dilat, R atr mod dilat, mod incr pulm press, Grd II DD.  09/2016--EF 40-45%; diffuse hypokinesis, Grd I DD.  . Macular  degeneration, dry    Mild, bilat (Optometrist, DMayford Knifeat MK[employer] Energyof [place]  . Mild cognitive impairment    MCI, likley vascular etiology, + MDD, recurrent-->neuropsych testing 06/2017.  Neurol to repeat testing 1 yr  . Mixed hyperlipidemia    statin and vascepa as of 04/2017  . Nocturnal hypoxemia   . Obesity   . Open toe wound 11/2015   Wound care clinic appt made and then canceled when toe improved.  . Other and unspecified angina pectoris   . PAD (peripheral artery disease) ([place] 02/2016   Abnormal ABI's and waveforms: LE arterial duplex ordered for f/u as per cardiologist's recommendation.  Vasc eval by Dr. CBridgett Larsson impression was minimal PAD, recommended maximize med mgmt.  .Marland KitchenPAF (paroxysmal atrial fibrillation) ([place] 10/2014   xarelto  . Pericardial effusion with cardiac tamponade 6//29/15   pericardiocentesis was done,  Infectious/inflamm (cytology showed NO MALIGNANT CELLS)  . Pleural plaque    Pleural plaques/asbestosis changes on CT chest done by pulm 05/2016.  .Marland KitchenPneumonia   . Tobacco dependence in remission    100+ pack-yr hx: quit after CABG    Past Surgical History:  Procedure Laterality Date  . APPENDECTOMY  1957  . CARDIAC CATHETERIZATION  08/08/2013  . CATARACT EXTRACTION W/ INTRAOCULAR LENS  IMPLANT, BILATERAL  04/08/2006 & 04/22/2006  . CATARACT EXTRACTION W/ INTRAOCULAR LENS  IMPLANT, BILATERAL Bilateral   . CHOLECYSTECTOMY OPEN  1999  . COLONOSCOPY  10/16/2011   Procedure: COLONOSCOPY;  Surgeon: RInda Castle MD;  [facility];  Service: Endoscopy;  Laterality: N/A;  . CORONARY ARTERY BYPASS GRAFT N/A 08/14/2013   Procedure: CORONARY ARTERY BYPASS GRAFTING (CABG);  Surgeon: SMelrose Nakayama MD;  [place]  Service: Open Heart Surgery;  Laterality: N/A;  Times 4   using left internal mammary artery and endoscopically harvested bilateral saphenous vein  . ESOPHAGOGASTRODUODENOSCOPY  02/25/12   Atrophic gastritis with a few  erosions--capsule endoscopy planned as of 02/25/12 (Dr. KDeatra Ina.  .Marland KitchenHARDWARE REMOVAL Right 08/12/2012   Procedure: HARDWARE REMOVAL;  Surgeon: CMcarthur Rossetti MD;  [facility];  Service: Orthopedics;  Laterality: Right;  . HIP SURGERY Right 1954   Repair of slipped capital femoral epiphysis.  . INTRAOPERATIVE TRANSESOPHAGEAL ECHOCARDIOGRAM N/A 08/14/2013   Normal LV function. Procedure: INTRAOPERATIVE TRANSESOPHAGEAL ECHOCARDIOGRAM;  Surgeon: SMelrose Nakayama MD;  [place]  Service: Open Heart Surgery;  Laterality: N/A;  . LEFT HEART CATHETERIZATION WITH CORONARY ANGIOGRAM N/A 08/08/2013   Procedure: LEFT HEART CATHETERIZATION WITH CORONARY ANGIOGRAM;  Surgeon: JJettie Booze MD;  [facility];  Service: Cardiovascular;  Laterality: N/A;  . PATELLA FRACTURE SURGERY Left ~ 1979   bolt + 3 screws to repair tib plateau  fx  . PERICARDIAL TAP N/A 07/01/2013   Procedure: PERICARDIAL TAP;  Surgeon: Jettie Booze, MD;  [facility];  Service: Cardiovascular;  Laterality: N/A;  . RIGHT/LEFT HEART CATH AND CORONARY ANGIOGRAPHY N/A 06/10/2016   Procedure: Right/Left Heart Cath and Coronary Angiography;  Surgeon: Larey Dresser, MD;  Location: [place] [facility] LAB;  Service: Cardiovascular;  Laterality: N/A;  . TESTICLE SURGERY  as a child   Undescended testicle brought down into scrotum  . TONSILLECTOMY  1947  . TOTAL HIP ARTHROPLASTY Right 08/12/2012   Procedure: REMOVAL OF OLD PINS RIGHT HIP AND RIGHT TOTAL HIP ARTHROPLASTY ANTERIOR APPROACH;  Surgeon: Mcarthur Rossetti, MD;  [facility];  Service: Orthopedics;  Laterality: Right;  . TRANSESOPHAGEAL ECHOCARDIOGRAM  09/22/2016   EF 40-45%; diffuse hypokinesis, Grd I DD.  [name] TRANSTHORACIC ECHOCARDIOGRAM  10/30/14   Mod LVH, EF 60-65%, normal wall motion, mod mitral regurg, mild PAH  . TRANSTHORACIC ECHOCARDIOGRAM  06/2016; 10/01/17   06/2016: EF 25-30%, global hypokinesis, grd II DD, vent septum  motion changes c/w LBBB, mod MV regurg, L atr mod/sev dilat, R atr mod dilat, mod incr pulm press, Grd II DD.  09/2017: EF 50-55%, Hypokinesis of the basal inferolateral wall, grd I DD, mild MR, +LAE.    Outpatient Medications Prior to Visit  Medication Sig Dispense Refill  . ACCU-CHEK GUIDE test strip USE TO CHECK BLOOD SUGAR 3 TIMES DAILY 100 each 11  . acetaminophen (TYLENOL) 325 MG tablet Take 2 tablets (650 mg total) by mouth every 4 (four) hours as needed for headache or mild pain. (Patient taking differently: Take 500 mg by mouth every 4 (four) hours as needed for headache or mild pain. )    . Ascorbic Acid (VITAMIN C) 1000 MG tablet Take 1,000 mg by mouth daily.    [name] atorvastatin (LIPITOR) 40 MG tablet Take 40 mg by mouth daily at 6 PM.     . Cholecalciferol (VITAMIN D-3) 1000 units CAPS Take 1,000 Units by mouth daily. Reported on 02/26/2015    . fluticasone (FLONASE) 50 MCG/ACT nasal spray Place 2 sprays into both nostrils daily. (Patient taking differently: Place 2 sprays into both nostrils as needed. ) 16 g 1  . gabapentin (NEURONTIN) 100 MG capsule TAKE TWO CAPSULES BY MOUTH THREE TIMES DAILY (Patient taking differently: Take 200 mg by mouth 2 (two) times daily. ) 180 capsule 11  . guaiFENesin (MUCINEX) 600 MG 12 hr tablet Take 2 tablets (1,200 mg total) by mouth 2 (two) times daily. 30 tablet 0  . hyoscyamine (LEVSIN SL) 0.125 MG SL tablet TAKE 1 TO 2 TABLETS BY MOUTH EVERY 6 HOURS AS NEEDED FOR DIARRHEA (Patient taking differently: Take 0.125-0.25 mg by mouth as needed for cramping. AS NEEDED FOR DIARRHEA) 60 tablet 3  . insulin regular human CONCENTRATED (HUMULIN R) 500 UNIT/ML injection INJECT 56 UNITS SUBCUTANEOUSLY DAILY IN INSULIN PUMP 20 mL 0  . isosorbide mononitrate (IMDUR) 30 MG 24 hr tablet TAKE ONE TABLET BY MOUTH EVERY DAY (Patient taking differently: Take 30 mg by mouth daily. Half a tablet a day) 90 tablet 1  . lipase/protease/amylase (CREON) 36000 UNITS CPEP capsule Take 2  capsules (72,000 Units total) by mouth 3 (three) times daily with meals. 780 capsule 3  . mirtazapine (REMERON) 45 MG tablet Take 1 tablet (45 mg total) by mouth at bedtime. 30 tablet 5  . Multiple Vitamin (MULTIVITAMIN WITH MINERALS) TABS Take 1 tablet by mouth daily.    . nitroGLYCERIN (  NITROSTAT) 0.4 MG SL tablet PLACE 1 TABLET UNDER THE TONGUE EVERY 5 MINUTES AS NEEDED FOR CHEST PAIN (Patient taking differently: Place 0.4 mg under the tongue every 5 (five) minutes as needed for chest pain. ) 25 tablet 3  . PROAIR HFA 108 (90 Base) MCG/ACT inhaler INHALE 2 PUFFS INTO THE LUNGS EVERY 4 HOURS AS NEEDED FOR WHEEZING ORSHORTNESS OF BREATH (Patient taking differently: Inhale 2 puffs into the lungs every 4 (four) hours as needed for wheezing or shortness of breath. ) 8.5 g 1  . sertraline (ZOLOFT) 25 MG tablet Take 2 tablets (50 mg total) by mouth at bedtime. 60 tablet 5  . sodium zirconium cyclosilicate (LOKELMA) 10 g PACK packet Take 10 g by mouth daily. 30 packet 11  . SYMBICORT 160-4.5 MCG/ACT inhaler INHALE 2 PUFFS INTO THE LUNGS TWICE DAILY (Patient taking differently: Inhale 2 puffs into the lungs 2 (two) times daily. ) 10.2 g 5  . SYNJARDY 05-998 MG TABS TAKE ONE TABLET BY MOUTH TWICE DAILY AFTER A MEAL 60 tablet 2  . tamsulosin (FLOMAX) 0.4 MG CAPS capsule TAKE ONE CAPSULE BY MOUTH DAILY AFTER SUPPER 90 capsule 1  . torsemide (DEMADEX) 20 MG tablet Take 20 mg every other day 60 tablet 6  . VASCEPA 1 g CAPS TAKE TWO CAPSULES BY MOUTH TWICE DAILY 120 capsule 2  . XARELTO 20 MG TABS tablet TAKE ONE TABLET BY MOUTH DAILY WITH SUPPER 30 tablet 11  . bisoprolol (ZEBETA) 5 MG tablet Take 0.5 tablets (2.5 mg total) by mouth daily. 15 tablet 11   No facility-administered medications prior to visit.     Allergies  Allergen Reactions  . Morphine And Related Other (See Comments)    Drenched with perspiration  . Entresto [Sacubitril-Valsartan]     hypotension  . Demerol [Meperidine] Nausea Only  .  Starlix [Nateglinide] Other (See Comments)    gassy    ROS As per HPI  PE: Blood pressure (!) 143/68, pulse 70, temperature 98.1 F (36.7 C), temperature source Oral, resp. rate 16, height _0  (1.803 m), weight 237 lb 4 oz (107.6 kg), SpO2 93 %. Body mass index is 33.09 kg/m.  Gen: Alert, well appearing.  Patient is oriented to person, place, time, and situation. AFFECT: pleasant, lucid thought and speech. MOQ:HUTM: no injection, icteris, swelling, or exudate.  EOMI, PERRLA. Mouth: lips without lesion/swelling.  Oral mucosa pink and moist. Oropharynx without erythema, exudate, or swelling.  CV: RRR, no m/r/g.   LUNGS: CTA bilat, nonlabored resps, good aeration in all lung fields. EXT: no clubbing or cyanosis.  1 + bilat LL "doughy" edema with hardly a trace of pitting edema.   LABS:  Lab Results  Component Value Date   TSH 1.612 08/28/2016   Lab Results  Component Value Date   WBC 10.8 (H) 10/05/2017   HGB 14.8 10/05/2017   HCT 46.4 10/05/2017   MCV 87.7 10/05/2017   PLT 168 10/05/2017   Lab Results  Component Value Date   CREATININE 0.99 10/28/2017   BUN 20 10/28/2017   NA 141 10/28/2017   K 4.2 10/28/2017   CL 103 10/28/2017   CO2 29 10/28/2017   Lab Results  Component Value Date   ALT 16 09/30/2017   AST 18 09/30/2017   ALKPHOS 63 09/30/2017   BILITOT 0.4 09/30/2017   Lab Results  Component Value Date   CHOL 121 04/26/2017   Lab Results  Component Value Date   HDL 31.80 (L) 04/26/2017  Lab Results  Component Value Date   Leal 5 02/23/2017   Lab Results  Component Value Date   TRIG 397.0 (H) 04/26/2017   Lab Results  Component Value Date   CHOLHDL 4 04/26/2017   Lab Results  Component Value Date   PSA 1.33 09/01/2011   Lab Results  Component Value Date   HGBA1C 9.0 (H) 10/01/2017    IMPRESSION AND PLAN:  1) Ischemic CM: last EF 50-55%, grd I DD (echo 09/2017). Wt gain the last 6 wks-->11 lbs.  Noncompliance with diuretic,  missed last 2 doses. Recommended he take his torsemide 38m today, repeat dose tomorrow, then resume qod dosing. He does not appear/have symptoms of volume overload at this time. No labs needed today.  2) HTN: not ideal control.  Increase bisoprolol to a WHOLE 5 mg tab once a day.  3) CRI stage III: Lytes/cr stable 1 mo ago.  4) HLD: low HDL, elev trigs, LDL 5 when checked 6 mo ago.  Tolerating atorv 476mqd. Not fasting today.  Plan recheck at his CPE visit next month.  An After Visit Summary was printed and given to the patient.  FOLLOW UP: Return for keep cpe appt scheduled for next month.  Signed:  PhCrissie SicklesMD           11/28/2017

## 2017-11-29 ENCOUNTER — Ambulatory Visit: Payer: Self-pay | Admitting: Endocrinology

## 2017-11-29 ENCOUNTER — Other Ambulatory Visit: Payer: Self-pay

## 2017-11-29 NOTE — Patient Outreach (Signed)
[facility]) Care Management  11/29/2017  Joshua [name] January 31, 1940 [id number]   Medication Adherence call to Joshua [name] left a message for patient to call back patient is due on Atorvastatin 40 mg and Lisinopril 10 mg.Joshua [name] is showing past due under [place].   [place] Management Direct Dial [phone]  Fax [phone] [name]

## 2017-12-07 ENCOUNTER — Other Ambulatory Visit: Payer: Self-pay | Admitting: Family Medicine

## 2017-12-14 ENCOUNTER — Telehealth: Payer: Self-pay | Admitting: Endocrinology

## 2017-12-14 ENCOUNTER — Other Ambulatory Visit: Payer: Self-pay

## 2017-12-14 ENCOUNTER — Telehealth: Payer: Self-pay

## 2017-12-14 ENCOUNTER — Other Ambulatory Visit: Payer: Self-pay | Admitting: Endocrinology

## 2017-12-14 MED ORDER — INSULIN REGULAR HUMAN (CONC) 500 UNIT/ML ~~LOC~~ SOLN: SUBCUTANEOUS | 0 refills | Status: DC

## 2017-12-14 NOTE — Telephone Encounter (Signed)
insulin regular human CONCENTRATED (HUMULIN R) 500 UNIT/ML injection   [facility] is requesting a call back to discuss the prescription was sent over to them  [facility]

## 2017-12-14 NOTE — Telephone Encounter (Signed)
On his visit with Dr. Maretta Bees he was supposed to be drawing up 10 units before each meal on the U-100 insulin but need to know how much he is doing now

## 2017-12-14 NOTE — Telephone Encounter (Signed)
Pt's pharmacy called and stated that the patient was requesting a new rx for his U-500 insulin and it is currently 2 months early. Pt showed pharmacist how he draws up his insulin.  Pharmacist stated that pt drew up his U-500 insulin as 28 units, but patient was using a U-100  Needle. Pharmacist inquired as to whether pt was taught to do it this way by MD.

## 2017-12-14 NOTE — Telephone Encounter (Signed)
The pharmacist stated that the patient told her he was instructed to stop using the V-Go pump and he could inject insulin using the syringe.

## 2017-12-14 NOTE — Telephone Encounter (Signed)
rx sent

## 2017-12-14 NOTE — Telephone Encounter (Signed)
Pt stated that he is using 10 units at breakfast and lunch and 8.5 units at dinner.

## 2017-12-14 NOTE — Telephone Encounter (Signed)
He is supposed to follow-up his V-go pump with about 0.6 mL daily which would give him about 1 vial per month.  Please educate the pharmacist, Vaughan Basta please help if needed

## 2017-12-14 NOTE — Telephone Encounter (Signed)
Pt stated that the bottle he is using now is the bottle he had when was using the V-Go pump and that is why he is ran out too soon.

## 2017-12-15 ENCOUNTER — Other Ambulatory Visit: Payer: Self-pay

## 2017-12-15 MED ORDER — INSULIN REGULAR HUMAN (CONC) 500 UNIT/ML ~~LOC~~ SOLN: SUBCUTANEOUS | 0 refills | Status: DC

## 2017-12-15 NOTE — Telephone Encounter (Signed)
Send another prescription for the U-500 insulin indicating he is taking 0.1 mL 3 times a day

## 2017-12-15 NOTE — Telephone Encounter (Signed)
Rx sent.

## 2017-12-16 ENCOUNTER — Ambulatory Visit (INDEPENDENT_AMBULATORY_CARE_PROVIDER_SITE_OTHER): Payer: Medicare Other | Admitting: Family Medicine

## 2017-12-16 ENCOUNTER — Encounter: Payer: Self-pay | Admitting: Family Medicine

## 2017-12-16 VITALS — BP 161/64 | HR 80 | Temp 98.5°F | Resp 16 | Ht 71.0 in | Wt 236.0 lb

## 2017-12-16 DIAGNOSIS — I2583 Coronary atherosclerosis due to lipid rich plaque: Secondary | ICD-10-CM

## 2017-12-16 DIAGNOSIS — Z0000 Encounter for general adult medical examination without abnormal findings: Secondary | ICD-10-CM | POA: Diagnosis not present

## 2017-12-16 DIAGNOSIS — I2510 Atherosclerotic heart disease of native coronary artery without angina pectoris: Secondary | ICD-10-CM | POA: Diagnosis not present

## 2017-12-16 LAB — BASIC METABOLIC PANEL
BUN: 18 mg/dL (ref 6–23)
CALCIUM: 8.7 mg/dL (ref 8.4–10.5)
CHLORIDE: 108 meq/L (ref 96–112)
CO2: 26 mEq/L (ref 19–32)
Creatinine, Ser: 0.78 mg/dL (ref 0.40–1.50)
GFR: 102.14 mL/min (ref >60.00)
Glucose, Bld: 75 mg/dL (ref 70–99)
Potassium: 4.6 mEq/L (ref 3.5–5.1)
Sodium: 141 mEq/L (ref 135–145)

## 2017-12-16 LAB — LIPID PANEL
CHOLESTEROL: 103 mg/dL (ref 0–200)
HDL: 40.4 mg/dL (ref >39.00)
LDL Cholesterol: 42 mg/dL (ref 0–99)
NonHDL: 62.65
Total CHOL/HDL Ratio: 3
Triglycerides: 105 mg/dL (ref 0.0–149.0)
VLDL: 21 mg/dL (ref 0.0–40.0)

## 2017-12-16 NOTE — Patient Instructions (Signed)
Health Maintenance, Male A healthy lifestyle and preventive care is important for your health and wellness. Ask your health care provider about what schedule of regular examinations is right for you. What should I know about weight and diet? Eat a Healthy Diet  Eat plenty of vegetables, fruits, whole grains, low-fat dairy products, and lean protein.  Do not eat a lot of foods high in solid fats, added sugars, or salt.  Maintain a Healthy Weight Regular exercise can help you achieve or maintain a healthy weight. You should:  Do at least 150 minutes of exercise each week. The exercise should increase your heart rate and make you sweat (moderate-intensity exercise).  Do strength-training exercises at least twice a week.  Watch Your Levels of Cholesterol and Blood Lipids  Have your blood tested for lipids and cholesterol every 5 years starting at 78 years of age. If you are at high risk for heart disease, you should start having your blood tested when you are 78 years old. You may need to have your cholesterol levels checked more often if: ? Your lipid or cholesterol levels are high. ? You are older than 78 years of age. ? You are at high risk for heart disease.  What should I know about cancer screening? Many types of cancers can be detected early and may often be prevented. Lung Cancer  You should be screened every year for lung cancer if: ? You are a current smoker who has smoked for at least 30 years. ? You are a former smoker who has quit within the past 15 years.  Talk to your health care provider about your screening options, when you should start screening, and how often you should be screened.  Colorectal Cancer  Routine colorectal cancer screening usually begins at 78 years of age and should be repeated every 5-10 years until you are 78 years old. You may need to be screened more often if early forms of precancerous polyps or small growths are found. Your health care provider  may recommend screening at an earlier age if you have risk factors for colon cancer.  Your health care provider may recommend using home test kits to check for hidden blood in the stool.  A small camera at the end of a tube can be used to examine your colon (sigmoidoscopy or colonoscopy). This checks for the earliest forms of colorectal cancer.  Prostate and Testicular Cancer  Depending on your age and overall health, your health care provider may do certain tests to screen for prostate and testicular cancer.  Talk to your health care provider about any symptoms or concerns you have about testicular or prostate cancer.  Skin Cancer  Check your skin from head to toe regularly.  Tell your health care provider about any new moles or changes in moles, especially if: ? There is a change in a mole's size, shape, or color. ? You have a mole that is larger than a pencil eraser.  Always use sunscreen. Apply sunscreen liberally and repeat throughout the day.  Protect yourself by wearing long sleeves, pants, a wide-brimmed hat, and sunglasses when outside.  What should I know about heart disease, diabetes, and high blood pressure?  If you are 89-13 years of age, have your blood pressure checked every 3-5 years. If you are 70 years of age or older, have your blood pressure checked every year. You should have your blood pressure measured twice-once when you are at a hospital or clinic, and once when  you are not at a hospital or clinic. Record the average of the two measurements. To check your blood pressure when you are not at a hospital or clinic, you can use: ? An automated blood pressure machine at a pharmacy. ? A home blood pressure monitor.  Talk to your health care provider about your target blood pressure.  If you are between 50-59 years old, ask your health care provider if you should take aspirin to prevent heart disease.  Have regular diabetes screenings by checking your fasting blood  sugar level. ? If you are at a normal weight and have a low risk for diabetes, have this test once every three years after the age of 46. ? If you are overweight and have a high risk for diabetes, consider being tested at a younger age or more often.  A one-time screening for abdominal aortic aneurysm (AAA) by ultrasound is recommended for men aged 70-75 years who are current or former smokers. What should I know about preventing infection? Hepatitis B If you have a higher risk for hepatitis B, you should be screened for this virus. Talk with your health care provider to find out if you are at risk for hepatitis B infection. Hepatitis C Blood testing is recommended for:  Everyone born from [birth year] through 1966.  Anyone with known risk factors for hepatitis C.  Sexually Transmitted Diseases (STDs)  You should be screened each year for STDs including gonorrhea and chlamydia if: ? You are sexually active and are younger than 78 years of age. ? You are older than 78 years of age and your health care provider tells you that you are at risk for this type of infection. ? Your sexual activity has changed since you were last screened and you are at an increased risk for chlamydia or gonorrhea. Ask your health care provider if you are at risk.  Talk with your health care provider about whether you are at high risk of being infected with HIV. Your health care provider may recommend a prescription medicine to help prevent HIV infection.  What else can I do?  Schedule regular health, dental, and eye exams.  Stay current with your vaccines (immunizations).  Do not use any tobacco products, such as cigarettes, chewing tobacco, and e-cigarettes. If you need help quitting, ask your health care provider.  Limit alcohol intake to no more than 2 drinks per day. One drink equals 12 ounces of beer, 5 ounces of wine, or 1 ounces of hard liquor.  Do not use street drugs.  Do not share needles.  Ask your  health care provider for help if you need support or information about quitting drugs.  Tell your health care provider if you often feel depressed.  Tell your health care provider if you have ever been abused or do not feel safe at home. This information is not intended to replace advice given to you by your health care provider. Make sure you discuss any questions you have with your health care provider. Document Released: 06/20/2007 Document Revised: 08/21/2015 Document Reviewed: 09/25/2014 Elsevier Interactive Patient Education  [employer].

## 2017-12-16 NOTE — Progress Notes (Signed)
Office Note 12/16/2017  CC:  Chief Complaint  Patient presents with  . Annual Exam    Pt is fasting.    HPI:  Joshua [name] is a 77 y.o. White male with hx of chronic hypoxic resp failure, CRIIII, HTN, hx of HLD who is here accompanied by his wife for annual health maintenance exam.   He has DM 2 managed by endocrinology.  Activity level: walks on flat surface 10-120 steps and gets winded--this is his baseline. No SOB at rest. Focuses on low Na and drinks about 80 oz water in 24 hour period.  Occ coffee. Eyes: last exam 09/2017, another due 02/2018 Pt has upper and lower dentures.   Past Medical History:  Diagnosis Date  . Acute respiratory distress 06/04/2016  . Adenomatous colon polyp 10/16/2011   Repeat 2018  . Arthritis     Hips, R>L & KNEES  . CAD, multiple vessel    3V CAD cath 08/08/13----CABG done shortly after.  Cath 06/2016: clear bipasses/no interventional lesion.  . 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 pylori neg, no dysplasia or malignancy.  . Chronic combined systolic and diastolic CHF, NYHA class 2 ([place]) 2016-2018   Followed by [facility].  Transesoph Echo 09/2016 EF improved to 40-45%; diffuse hypokinesis, Grd I DD.  09/2017 EF 50-55%, grd I DD.  [name] Chronic hypoxemic respiratory failure (HCC)    HISTORY OF(2018): oxygen testing normal in office 11/10/17, no home oxygen was not recertified.  . Chronic renal insufficiency, stage 3 (moderate) ([place]) 2018   GFR 50s  . COPD (chronic obstructive pulmonary disease) ([place])    GOLD II.  Spirometry  2004 borderline obstruction; 2015 mod obst: noncompliant with bid symbicort so pulmonologist switched him to a once daily inhaler: Trelegy ellipta 12/2015.  Oxygen prn: goal 88-92%.  . Diabetic nephropathy ([place])    Elevated urine microalb/cr 03/2011  . DM type 2 (diabetes mellitus, type 2) ([place])    Poor control on max oral meds--pt eventually agreed to insulin therapy.  As of 2017  his DM is being managed by Dr. Dwyane Dee in endo.  On insulin pump as of 04/2017 endo f/u.  [name] DOE (dyspnea on exertion)    COPD + chronic diastolic HF  . Dysthymia    Worsened after 2015 heart surgery.  Memory loss +? as well.  Neuro eval 01/2017--plan is to get MRI brain, B12 level, and neuropsychiatric testing.  . Erectile dysfunction    Normal testosterone  . Hearing loss of both ears 2016   Hearing aids  . Hyperplastic colon polyp 2001   No further colonoscopies to be done as of 11/2016 GI eval.  . Hypertension   . IBS (irritable bowel syndrome)    responds relatively well to prn levsin.  Pepto bismol q AM trial by Dr. Loletha Carrow 08/2017, also checking fecal elastace and lactoferrin.  . Iron deficiency anemia 2014   03/2012 capsule endoscopy showed 2 AVMs--likely responsible for his IDA--lifetime iron supp recommended + q61moCBCs.  . Ischemic cardiomyopathy 06/2016; 09/2016   06/2016: EF 25-30%, global hypokinesis, grd II DD, vent septum motion changes c/w LBBB, mod MV regurg, L atr mod/sev dilat, R atr mod dilat, mod incr pulm press, Grd II DD.  09/2016--EF 40-45%; diffuse hypokinesis, Grd I DD.  . Macular degeneration, dry    Mild, bilat (Optometrist, DMayford Knifeat [employer] [place]  . Mild cognitive impairment    MCI, likley vascular etiology, +  MDD, recurrent-->neuropsych testing 06/2017.  Neurol to repeat testing 1 yr  . Mixed hyperlipidemia    statin and vascepa as of 04/2017  . Nocturnal hypoxemia   . Obesity   . Open toe wound 11/2015   Wound care clinic appt made and then canceled when toe improved.  . Other and unspecified angina pectoris   . PAD (peripheral artery disease) ([place]) 02/2016   Abnormal ABI's and waveforms: LE arterial duplex ordered for f/u as per cardiologist's recommendation.  Vasc eval by Dr. Bridgett Larsson: impression was minimal PAD, recommended maximize med mgmt.  [name] PAF (paroxysmal atrial fibrillation) ([place]) 10/2014   xarelto  . Pericardial effusion with  cardiac tamponade 6//29/15   pericardiocentesis was done,  Infectious/inflamm (cytology showed NO MALIGNANT CELLS)  . Pleural plaque    Pleural plaques/asbestosis changes on CT chest done by pulm 05/2016.  [name] Pneumonia   . Tobacco dependence in remission    100+ pack-yr hx: quit after CABG    Past Surgical History:  Procedure Laterality Date  . APPENDECTOMY  1957  . CARDIAC CATHETERIZATION  08/08/2013  . CATARACT EXTRACTION W/ INTRAOCULAR LENS  IMPLANT, BILATERAL  04/08/2006 & 04/22/2006  . CATARACT EXTRACTION W/ INTRAOCULAR LENS  IMPLANT, BILATERAL Bilateral   . CHOLECYSTECTOMY OPEN  1999  . COLONOSCOPY  10/16/2011   Procedure: COLONOSCOPY;  Surgeon: Inda Castle, MD;  Location: [facility];  Service: Endoscopy;  Laterality: N/A;  . CORONARY ARTERY BYPASS GRAFT N/A 08/14/2013   Procedure: CORONARY ARTERY BYPASS GRAFTING (CABG);  Surgeon: Melrose Nakayama, MD;  Location: [facility];  Service: Open Heart Surgery;  Laterality: N/A;  Times 4   using left internal mammary artery and endoscopically harvested bilateral saphenous vein  . ESOPHAGOGASTRODUODENOSCOPY  02/25/12   Atrophic gastritis with a few erosions--capsule endoscopy planned as of 02/25/12 (Dr. Deatra Ina).  [name] HARDWARE REMOVAL Right 08/12/2012   Procedure: HARDWARE REMOVAL;  Surgeon: Mcarthur Rossetti, MD;  Location: [facility];  Service: Orthopedics;  Laterality: Right;  . HIP SURGERY Right 1954   Repair of slipped capital femoral epiphysis.  . INTRAOPERATIVE TRANSESOPHAGEAL ECHOCARDIOGRAM N/A 08/14/2013   Normal LV function. Procedure: INTRAOPERATIVE TRANSESOPHAGEAL ECHOCARDIOGRAM;  Surgeon: Melrose Nakayama, MD;  Location: [facility];  Service: Open Heart Surgery;  Laterality: N/A;  . LEFT HEART CATHETERIZATION WITH CORONARY ANGIOGRAM N/A 08/08/2013   Procedure: LEFT HEART CATHETERIZATION WITH CORONARY ANGIOGRAM;  Surgeon: Jettie Booze, MD;  Location: [facility] CATH LAB;  Service: Cardiovascular;  Laterality: N/A;  . PATELLA FRACTURE  SURGERY Left ~ 1979   bolt + 3 screws to repair tib plateau fx  . PERICARDIAL TAP N/A 07/01/2013   Procedure: PERICARDIAL TAP;  Surgeon: Jettie Booze, MD;  Location: [facility] CATH LAB;  Service: Cardiovascular;  Laterality: N/A;  . RIGHT/LEFT HEART CATH AND CORONARY ANGIOGRAPHY N/A 06/10/2016   Procedure: Right/Left Heart Cath and Coronary Angiography;  Surgeon: Larey Dresser, MD;  Location: [facility] CV LAB;  Service: Cardiovascular;  Laterality: N/A;  . TESTICLE SURGERY  as a child   Undescended testicle brought down into scrotum  . TONSILLECTOMY  1947  . TOTAL HIP ARTHROPLASTY Right 08/12/2012   Procedure: REMOVAL OF OLD PINS RIGHT HIP AND RIGHT TOTAL HIP ARTHROPLASTY ANTERIOR APPROACH;  Surgeon: Mcarthur Rossetti, MD;  Location: [facility];  Service: Orthopedics;  Laterality: Right;  . TRANSESOPHAGEAL ECHOCARDIOGRAM  09/22/2016   EF 40-45%; diffuse hypokinesis, Grd I DD.  [name] TRANSTHORACIC ECHOCARDIOGRAM  10/30/14   Mod LVH, EF 60-65%, normal wall motion,  mod mitral regurg, mild PAH  . TRANSTHORACIC ECHOCARDIOGRAM  06/2016; 10/01/17   06/2016: EF 25-30%, global hypokinesis, grd II DD, vent septum motion changes c/w LBBB, mod MV regurg, L atr mod/sev dilat, R atr mod dilat, mod incr pulm press, Grd II DD.  09/2017: EF 50-55%, Hypokinesis of the basal inferolateral wall, grd I DD, mild MR, +LAE.    Family History  Problem Relation Age of Onset  . Heart disease Father   . Heart attack Father   . CVA Mother   . Hypertension Mother   . Diabetes Paternal Grandmother   . Breast cancer Sister   . Diabetes Maternal Uncle   . Heart disease Maternal Uncle   . Stroke Neg Hx     Social History   Socioeconomic History  . Marital status: Married    Spouse name: [name]  . Number of children: 4  . Years of education: 10  . Highest education level: Not on file  Occupational History  . Occupation: retired, former Scientist, forensic  Social Needs  . Financial resource strain: Not hard at all  .  Food insecurity:    Worry: Never true    Inability: Never true  . Transportation needs:    Medical: No    Non-medical: No  Tobacco Use  . Smoking status: Current Every Day Smoker    Packs/day: 1.00    Years: 64.00    Pack years: 64.00    Types: Cigarettes    Last attempt to quit: 10/23/2015    Years since quitting: 2.1  . Smokeless tobacco: Former Systems developer    Quit date: 06/30/2013  Substance and Sexual Activity  . Alcohol use: No    Alcohol/week: 0.0 standard drinks  . Drug use: No  . Sexual activity: Never  Lifestyle  . Physical activity:    Days per week: Not on file    Minutes per session: Not on file  . Stress: Not on file  Relationships  . Social connections:    Talks on phone: Not on file    Gets together: Not on file    Attends religious service: Not on file    Active member of club or organization: Not on file    Attends meetings of clubs or organizations: Not on file    Relationship status: Not on file  . Intimate partner violence:    Fear of current or ex partner: Not on file    Emotionally abused: Not on file    Physically abused: Not on file    Forced sexual activity: Not on file  Other Topics Concern  . Not on file  Social History Narrative   Married, 4 children.   Formerly a Medical illustrator.   Level of education: HS.     +tobacco--lifelong/ quit 2015).  No alcohol or drugs.   No exercise.  +excessive caffeine.      Lives in 1 story home with his wife and her sister    Outpatient Medications Prior to Visit  Medication Sig Dispense Refill  . ACCU-CHEK GUIDE test strip USE TO CHECK BLOOD SUGAR 3 TIMES DAILY 100 each 11  . acetaminophen (TYLENOL) 325 MG tablet Take 2 tablets (650 mg total) by mouth every 4 (four) hours as needed for headache or mild pain. (Patient taking differently: Take 500 mg by mouth every 4 (four) hours as needed for headache or mild pain. )    . Ascorbic Acid (VITAMIN C) 1000 MG tablet Take 1,000 mg by mouth daily.     [name]  atorvastatin (LIPITOR) 40 MG tablet TAKE ONE TABLET BY MOUTH DAILY 90 tablet 0  . bisoprolol (ZEBETA) 5 MG tablet Take 1 tablet (5 mg total) by mouth daily. 30 tablet 11  . Cholecalciferol (VITAMIN D-3) 1000 units CAPS Take 1,000 Units by mouth daily. Reported on 02/26/2015    . fluticasone (FLONASE) 50 MCG/ACT nasal spray Place 2 sprays into both nostrils daily. (Patient taking differently: Place 2 sprays into both nostrils as needed. ) 16 g 1  . gabapentin (NEURONTIN) 100 MG capsule TAKE TWO CAPSULES BY MOUTH THREE TIMES DAILY (Patient taking differently: Take 200 mg by mouth 2 (two) times daily. ) 180 capsule 11  . guaiFENesin (MUCINEX) 600 MG 12 hr tablet Take 2 tablets (1,200 mg total) by mouth 2 (two) times daily. 30 tablet 0  . insulin regular human CONCENTRATED (HUMULIN R) 500 UNIT/ML injection Inject 0.67m under the skin 3 times daily. 20 mL 0  . isosorbide mononitrate (IMDUR) 30 MG 24 hr tablet TAKE ONE TABLET BY MOUTH EVERY DAY (Patient taking differently: Take 30 mg by mouth daily. Half a tablet a day) 90 tablet 1  . lipase/protease/amylase (CREON) 36000 UNITS CPEP capsule Take 2 capsules (72,000 Units total) by mouth 3 (three) times daily with meals. 780 capsule 3  . mirtazapine (REMERON) 45 MG tablet Take 1 tablet (45 mg total) by mouth at bedtime. 30 tablet 5  . Multiple Vitamin (MULTIVITAMIN WITH MINERALS) TABS Take 1 tablet by mouth daily.    . nitroGLYCERIN (NITROSTAT) 0.4 MG SL tablet PLACE 1 TABLET UNDER THE TONGUE EVERY 5 MINUTES AS NEEDED FOR CHEST PAIN (Patient taking differently: Place 0.4 mg under the tongue every 5 (five) minutes as needed for chest pain. ) 25 tablet 3  . PROAIR HFA 108 (90 Base) MCG/ACT inhaler INHALE 2 PUFFS INTO THE LUNGS EVERY 4 HOURS AS NEEDED FOR WHEEZING ORSHORTNESS OF BREATH (Patient taking differently: Inhale 2 puffs into the lungs every 4 (four) hours as needed for wheezing or shortness of breath. ) 8.5 g 1  . sertraline (ZOLOFT) 25 MG tablet Take  2 tablets (50 mg total) by mouth at bedtime. 60 tablet 5  . sodium zirconium cyclosilicate (LOKELMA) 10 g PACK packet Take 10 g by mouth daily. 30 packet 11  . SYMBICORT 160-4.5 MCG/ACT inhaler INHALE 2 PUFFS INTO THE LUNGS TWICE DAILY (Patient taking differently: Inhale 2 puffs into the lungs 2 (two) times daily. ) 10.2 g 5  . SYNJARDY 05-998 MG TABS TAKE ONE TABLET BY MOUTH TWICE DAILY AFTER A MEAL 60 tablet 2  . tamsulosin (FLOMAX) 0.4 MG CAPS capsule TAKE ONE CAPSULE BY MOUTH DAILY AFTER SUPPER 90 capsule 1  . torsemide (DEMADEX) 20 MG tablet Take 20 mg every other day 60 tablet 6  . VASCEPA 1 g CAPS TAKE TWO CAPSULES BY MOUTH TWICE DAILY 120 capsule 2  . XARELTO 20 MG TABS tablet TAKE ONE TABLET BY MOUTH DAILY WITH SUPPER 30 tablet 11  . hyoscyamine (LEVSIN SL) 0.125 MG SL tablet TAKE 1 TO 2 TABLETS BY MOUTH EVERY 6 HOURS AS NEEDED FOR DIARRHEA (Patient not taking: Reported on 12/16/2017) 60 tablet 3   No facility-administered medications prior to visit.     Allergies  Allergen Reactions  . Morphine And Related Other (See Comments)    Drenched with perspiration  . Entresto [Sacubitril-Valsartan]     hypotension  . Demerol [Meperidine] Nausea Only  . Starlix [Nateglinide] Other (See Comments)    gassy    ROS Review  of Systems  Constitutional: Positive for fatigue (chronic). Negative for appetite change, chills and fever.  HENT: Negative for congestion, dental problem, ear pain and sore throat.   Eyes: Negative for discharge, redness and visual disturbance.  Respiratory: Positive for shortness of breath (chronic DOE). Negative for cough, chest tightness and wheezing.   Cardiovascular: Negative for chest pain, palpitations and leg swelling.  Gastrointestinal: Negative for abdominal pain, blood in stool, diarrhea, nausea and vomiting.  Genitourinary: Negative for difficulty urinating, dysuria, flank pain, frequency, hematuria and urgency.  Musculoskeletal: Negative for arthralgias,  back pain, joint swelling, myalgias and neck stiffness.  Skin: Negative for pallor and rash.  Neurological: Negative for dizziness, speech difficulty, weakness and headaches.  Hematological: Negative for adenopathy. Does not bruise/bleed easily.  Psychiatric/Behavioral: Negative for confusion and sleep disturbance. The patient is not nervous/anxious.     PE; Initial bp 161/64, recheck at end of visit was 148/90 Blood pressure (!) 161/64, pulse 80, temperature 98.5 F (36.9 C), temperature source Oral, resp. rate 16, height _0  (1.803 m), weight 236 lb (107 kg), SpO2 96 %. Body mass index is 32.92 kg/m. Gen: Alert, well appearing.  Patient is oriented to person, place, time, and situation. AFFECT: pleasant, lucid thought and speech. ENT: Ears: EACs clear, normal epithelium.  TMs with good light reflex and landmarks bilaterally.  Eyes: no injection, icteris, swelling, or exudate.  EOMI, PERRLA. Nose: no drainage or turbinate edema/swelling.  No injection or focal lesion.  Mouth: lips without lesion/swelling.  Oral mucosa pink and moist.  Dentition intact and without obvious caries or gingival swelling.  Oropharynx without erythema, exudate, or swelling.  Neck: supple/nontender.  No LAD, mass, or TM.  Carotid pulses 2+ bilaterally, without bruits. CV: RRR, no m/r/g.   LUNGS: CTA bilat, nonlabored resps, good aeration in all lung fields. ABD: soft, NT, ND, BS normal.  No hepatospenomegaly or mass.  No bruits. EXT: no clubbing or cyanosis.  2+bilat LL pitting edema.  Musculoskeletal: no joint swelling, erythema, warmth, or tenderness.  ROM of all joints intact. Skin - no sores or suspicious lesions or rashes or color changes   Pertinent labs:  Lab Results  Component Value Date   TSH 1.612 08/28/2016   Lab Results  Component Value Date   WBC 10.8 (H) 10/05/2017   HGB 14.8 10/05/2017   HCT 46.4 10/05/2017   MCV 87.7 10/05/2017   PLT 168 10/05/2017   Lab Results  Component Value Date    CREATININE 0.99 10/28/2017   BUN 20 10/28/2017   NA 141 10/28/2017   K 4.2 10/28/2017   CL 103 10/28/2017   CO2 29 10/28/2017   Lab Results  Component Value Date   ALT 16 09/30/2017   AST 18 09/30/2017   ALKPHOS 63 09/30/2017   BILITOT 0.4 09/30/2017   Lab Results  Component Value Date   CHOL 121 04/26/2017   Lab Results  Component Value Date   HDL 31.80 (L) 04/26/2017   Lab Results  Component Value Date   LDLCALC 5 02/23/2017   Lab Results  Component Value Date   TRIG 397.0 (H) 04/26/2017   Lab Results  Component Value Date   CHOLHDL 4 04/26/2017   Lab Results  Component Value Date   PSA 1.33 09/01/2011   Lab Results  Component Value Date   HGBA1C 9.0 (H) 10/01/2017    ASSESSMENT AND PLAN:   Health maintenance exam: Reviewed age and gender appropriate health maintenance issues (prudent diet, regular exercise, health risks  of tobacco and excessive alcohol, use of seatbelts, fire alarms in home, use of sunscreen).  Also reviewed age and gender appropriate health screening as well as vaccine recommendations. Vaccines: he is completely UTD. Labs: BMET and FLP are the only lab he is due for today. Prostate ca screening: no longer indicated. Colon ca screening: no further colonoscopies needed.  An After Visit Summary was printed and given to the patient.  FOLLOW UP:  Return in about 4 months (around 04/17/2018) for routine chronic illness f/u.  Signed:  Crissie Sickles, MD           12/16/2017

## 2017-12-20 ENCOUNTER — Telehealth: Payer: Self-pay | Admitting: Psychology

## 2017-12-20 NOTE — Telephone Encounter (Signed)
Spoke with wife regarding the letter that was mailed (Dr. Si Raider leaving). Wife stated that they prefer to wait on Dr. Marcia Brash replacement. They preferred to not go elsewhere.

## 2017-12-22 ENCOUNTER — Inpatient Hospital Stay (HOSPITAL_COMMUNITY)
Admission: EM | Admit: 2017-12-22 | Discharge: 2017-12-27 | DRG: 190 | Disposition: A | Discharge status: Home / Self Care | Payer: Medicare Other | Attending: Internal Medicine | Admitting: Internal Medicine

## 2017-12-22 ENCOUNTER — Emergency Department (HOSPITAL_COMMUNITY): Payer: Medicare Other

## 2017-12-22 ENCOUNTER — Ambulatory Visit: Payer: Self-pay

## 2017-12-22 ENCOUNTER — Encounter (HOSPITAL_COMMUNITY): Payer: Self-pay | Admitting: Pharmacy Technician

## 2017-12-22 ENCOUNTER — Other Ambulatory Visit: Payer: Self-pay

## 2017-12-22 DIAGNOSIS — Z96641 Presence of right artificial hip joint: Secondary | ICD-10-CM | POA: Diagnosis not present

## 2017-12-22 DIAGNOSIS — F17210 Nicotine dependence, cigarettes, uncomplicated: Secondary | ICD-10-CM | POA: Diagnosis present

## 2017-12-22 DIAGNOSIS — Z9641 Presence of insulin pump (external) (internal): Secondary | ICD-10-CM | POA: Diagnosis present

## 2017-12-22 DIAGNOSIS — H9193 Unspecified hearing loss, bilateral: Secondary | ICD-10-CM | POA: Diagnosis not present

## 2017-12-22 DIAGNOSIS — E1122 Type 2 diabetes mellitus with diabetic chronic kidney disease: Secondary | ICD-10-CM | POA: Diagnosis not present

## 2017-12-22 DIAGNOSIS — E875 Hyperkalemia: Secondary | ICD-10-CM | POA: Diagnosis not present

## 2017-12-22 DIAGNOSIS — Z77090 Contact with and (suspected) exposure to asbestos: Secondary | ICD-10-CM | POA: Diagnosis present

## 2017-12-22 DIAGNOSIS — N183 Chronic kidney disease, stage 3 unspecified: Secondary | ICD-10-CM | POA: Diagnosis present

## 2017-12-22 DIAGNOSIS — H3530 Unspecified macular degeneration: Secondary | ICD-10-CM | POA: Diagnosis present

## 2017-12-22 DIAGNOSIS — R0602 Shortness of breath: Secondary | ICD-10-CM | POA: Diagnosis not present

## 2017-12-22 DIAGNOSIS — E1151 Type 2 diabetes mellitus with diabetic peripheral angiopathy without gangrene: Secondary | ICD-10-CM | POA: Diagnosis present

## 2017-12-22 DIAGNOSIS — Z7951 Long term (current) use of inhaled steroids: Secondary | ICD-10-CM

## 2017-12-22 DIAGNOSIS — I5043 Acute on chronic combined systolic (congestive) and diastolic (congestive) heart failure: Secondary | ICD-10-CM | POA: Diagnosis not present

## 2017-12-22 DIAGNOSIS — E782 Mixed hyperlipidemia: Secondary | ICD-10-CM | POA: Diagnosis present

## 2017-12-22 DIAGNOSIS — I5041 Acute combined systolic (congestive) and diastolic (congestive) heart failure: Secondary | ICD-10-CM

## 2017-12-22 DIAGNOSIS — R0902 Hypoxemia: Secondary | ICD-10-CM | POA: Diagnosis not present

## 2017-12-22 DIAGNOSIS — J449 Chronic obstructive pulmonary disease, unspecified: Secondary | ICD-10-CM | POA: Diagnosis present

## 2017-12-22 DIAGNOSIS — J441 Chronic obstructive pulmonary disease with (acute) exacerbation: Principal | ICD-10-CM | POA: Diagnosis present

## 2017-12-22 DIAGNOSIS — I447 Left bundle-branch block, unspecified: Secondary | ICD-10-CM | POA: Diagnosis not present

## 2017-12-22 DIAGNOSIS — I2510 Atherosclerotic heart disease of native coronary artery without angina pectoris: Secondary | ICD-10-CM | POA: Diagnosis not present

## 2017-12-22 DIAGNOSIS — J9601 Acute respiratory failure with hypoxia: Secondary | ICD-10-CM | POA: Diagnosis present

## 2017-12-22 DIAGNOSIS — T380X5A Adverse effect of glucocorticoids and synthetic analogues, initial encounter: Secondary | ICD-10-CM | POA: Diagnosis not present

## 2017-12-22 DIAGNOSIS — Z951 Presence of aortocoronary bypass graft: Secondary | ICD-10-CM | POA: Diagnosis not present

## 2017-12-22 DIAGNOSIS — E785 Hyperlipidemia, unspecified: Secondary | ICD-10-CM | POA: Diagnosis present

## 2017-12-22 DIAGNOSIS — E1165 Type 2 diabetes mellitus with hyperglycemia: Secondary | ICD-10-CM | POA: Diagnosis present

## 2017-12-22 DIAGNOSIS — I428 Other cardiomyopathies: Secondary | ICD-10-CM | POA: Diagnosis not present

## 2017-12-22 DIAGNOSIS — Z66 Do not resuscitate: Secondary | ICD-10-CM | POA: Diagnosis present

## 2017-12-22 DIAGNOSIS — J9621 Acute and chronic respiratory failure with hypoxia: Secondary | ICD-10-CM | POA: Diagnosis present

## 2017-12-22 DIAGNOSIS — I5033 Acute on chronic diastolic (congestive) heart failure: Secondary | ICD-10-CM | POA: Diagnosis not present

## 2017-12-22 DIAGNOSIS — I255 Ischemic cardiomyopathy: Secondary | ICD-10-CM | POA: Diagnosis present

## 2017-12-22 DIAGNOSIS — I959 Hypotension, unspecified: Secondary | ICD-10-CM | POA: Diagnosis not present

## 2017-12-22 DIAGNOSIS — I10 Essential (primary) hypertension: Secondary | ICD-10-CM | POA: Diagnosis present

## 2017-12-22 DIAGNOSIS — I130 Hypertensive heart and chronic kidney disease with heart failure and stage 1 through stage 4 chronic kidney disease, or unspecified chronic kidney disease: Secondary | ICD-10-CM | POA: Diagnosis not present

## 2017-12-22 DIAGNOSIS — Z79899 Other long term (current) drug therapy: Secondary | ICD-10-CM

## 2017-12-22 DIAGNOSIS — I5032 Chronic diastolic (congestive) heart failure: Secondary | ICD-10-CM | POA: Diagnosis not present

## 2017-12-22 DIAGNOSIS — I440 Atrioventricular block, first degree: Secondary | ICD-10-CM | POA: Diagnosis not present

## 2017-12-22 DIAGNOSIS — Z7901 Long term (current) use of anticoagulants: Secondary | ICD-10-CM

## 2017-12-22 DIAGNOSIS — I480 Paroxysmal atrial fibrillation: Secondary | ICD-10-CM | POA: Diagnosis not present

## 2017-12-22 DIAGNOSIS — Z888 Allergy status to other drugs, medicaments and biological substances status: Secondary | ICD-10-CM

## 2017-12-22 DIAGNOSIS — Z885 Allergy status to narcotic agent status: Secondary | ICD-10-CM

## 2017-12-22 DIAGNOSIS — R062 Wheezing: Secondary | ICD-10-CM | POA: Diagnosis not present

## 2017-12-22 DIAGNOSIS — E118 Type 2 diabetes mellitus with unspecified complications: Secondary | ICD-10-CM | POA: Diagnosis present

## 2017-12-22 LAB — BASIC METABOLIC PANEL
Anion gap: 11 (ref 5–15)
BUN: 15 mg/dL (ref 8–23)
CHLORIDE: 104 mmol/L (ref 98–111)
CO2: 27 mmol/L (ref 22–32)
Calcium: 9 mg/dL (ref 8.9–10.3)
Creatinine, Ser: 0.87 mg/dL (ref 0.61–1.24)
GFR calc Af Amer: >60 mL/min (ref >60)
GFR calc non Af Amer: >60 mL/min (ref >60)
Glucose, Bld: 181 mg/dL — ABNORMAL HIGH (ref 70–99)
Potassium: 4.2 mmol/L (ref 3.5–5.1)
Sodium: 142 mmol/L (ref 135–145)

## 2017-12-22 LAB — CBC
HCT: 48.8 % (ref 39.0–52.0)
Hemoglobin: 14.5 g/dL (ref 13.0–17.0)
MCH: 26.8 pg (ref 26.0–34.0)
MCHC: 29.7 g/dL — ABNORMAL LOW (ref 30.0–36.0)
MCV: 90.2 fL (ref 80.0–100.0)
Platelets: 213 10*3/uL (ref 150–400)
RBC: 5.41 MIL/uL (ref 4.22–5.81)
RDW: 15.6 % — ABNORMAL HIGH (ref 11.5–15.5)
WBC: 11.1 10*3/uL — ABNORMAL HIGH (ref 4.0–10.5)
nRBC: 0 % (ref 0.0–0.2)

## 2017-12-22 LAB — I-STAT CG4 LACTIC ACID, ED: Lactic Acid, Venous: 2.37 mmol/L (ref 0.5–1.9)

## 2017-12-22 LAB — BRAIN NATRIURETIC PEPTIDE: B NATRIURETIC PEPTIDE 5: 661.3 pg/mL — AB (ref 0.0–100.0)

## 2017-12-22 LAB — I-STAT TROPONIN, ED: TROPONIN I, POC: 0.03 ng/mL (ref 0.00–0.08)

## 2017-12-22 LAB — GLUCOSE, CAPILLARY: Glucose-Capillary: 528 mg/dL (ref 70–99)

## 2017-12-22 MED ORDER — ACETAMINOPHEN 325 MG PO TABS: 650.0000 mg | ORAL_TABLET | Freq: Four times a day (QID) | ORAL | Status: DC | PRN

## 2017-12-22 MED ORDER — ACETAMINOPHEN 650 MG RE SUPP: 650.0000 mg | Freq: Four times a day (QID) | RECTAL | Status: DC | PRN

## 2017-12-22 MED ORDER — VITAMIN D 25 MCG (1000 UNIT) PO TABS
1000.0000 [IU] | ORAL_TABLET | Freq: Every day | ORAL | Status: DC
  Administered 2017-12-23 – 2017-12-27 (×5): 1000 [IU] via ORAL
  Filled 2017-12-22 (×5): qty 1

## 2017-12-22 MED ORDER — VITAMIN D-3 25 MCG (1000 UT) PO CAPS: 1000.0000 [IU] | ORAL_CAPSULE | Freq: Every day | ORAL | Status: DC

## 2017-12-22 MED ORDER — ORAL CARE MOUTH RINSE
15.0000 mL | Freq: Two times a day (BID) | OROMUCOSAL | Status: DC
  Administered 2017-12-23 – 2017-12-26 (×8): 15 mL via OROMUCOSAL

## 2017-12-22 MED ORDER — GABAPENTIN 300 MG PO CAPS
300.0000 mg | ORAL_CAPSULE | Freq: Two times a day (BID) | ORAL | Status: DC
  Administered 2017-12-22 – 2017-12-27 (×10): 300 mg via ORAL
  Filled 2017-12-22 (×10): qty 1

## 2017-12-22 MED ORDER — METHYLPREDNISOLONE SODIUM SUCC 125 MG IJ SOLR
80.0000 mg | Freq: Three times a day (TID) | INTRAMUSCULAR | Status: DC
  Administered 2017-12-22 – 2017-12-24 (×5): 80 mg via INTRAVENOUS
  Filled 2017-12-22 (×5): qty 2

## 2017-12-22 MED ORDER — INSULIN ASPART 100 UNIT/ML ~~LOC~~ SOLN
0.0000 [IU] | Freq: Three times a day (TID) | SUBCUTANEOUS | Status: DC
  Administered 2017-12-23: 9 [IU] via SUBCUTANEOUS

## 2017-12-22 MED ORDER — RIVAROXABAN 20 MG PO TABS
20.0000 mg | ORAL_TABLET | Freq: Every day | ORAL | Status: DC
  Administered 2017-12-23 – 2017-12-26 (×4): 20 mg via ORAL
  Filled 2017-12-22 (×5): qty 1

## 2017-12-22 MED ORDER — MOMETASONE FURO-FORMOTEROL FUM 200-5 MCG/ACT IN AERO
2.0000 | INHALATION_SPRAY | Freq: Two times a day (BID) | RESPIRATORY_TRACT | Status: DC
  Administered 2017-12-23 – 2017-12-27 (×7): 2 via RESPIRATORY_TRACT
  Filled 2017-12-22: qty 8.8

## 2017-12-22 MED ORDER — GUAIFENESIN ER 600 MG PO TB12
1200.0000 mg | ORAL_TABLET | Freq: Two times a day (BID) | ORAL | Status: DC
  Administered 2017-12-22 – 2017-12-27 (×10): 1200 mg via ORAL
  Filled 2017-12-22 (×10): qty 2

## 2017-12-22 MED ORDER — PREDNISONE 20 MG PO TABS
60.0000 mg | ORAL_TABLET | Freq: Once | ORAL | Status: DC
  Filled 2017-12-22: qty 3

## 2017-12-22 MED ORDER — ICOSAPENT ETHYL 1 G PO CAPS: 2.0000 | ORAL_CAPSULE | Freq: Two times a day (BID) | ORAL | Status: DC

## 2017-12-22 MED ORDER — TAMSULOSIN HCL 0.4 MG PO CAPS
0.4000 mg | ORAL_CAPSULE | Freq: Every day | ORAL | Status: DC
  Administered 2017-12-22 – 2017-12-26 (×5): 0.4 mg via ORAL
  Filled 2017-12-22 (×5): qty 1

## 2017-12-22 MED ORDER — ADULT MULTIVITAMIN W/MINERALS CH
1.0000 | ORAL_TABLET | Freq: Every day | ORAL | Status: DC
  Administered 2017-12-23 – 2017-12-27 (×5): 1 via ORAL
  Filled 2017-12-22 (×5): qty 1

## 2017-12-22 MED ORDER — FUROSEMIDE 10 MG/ML IJ SOLN
40.0000 mg | Freq: Two times a day (BID) | INTRAMUSCULAR | Status: DC
  Administered 2017-12-23 – 2017-12-27 (×9): 40 mg via INTRAVENOUS
  Filled 2017-12-22 (×9): qty 4

## 2017-12-22 MED ORDER — MIRTAZAPINE 15 MG PO TABS
45.0000 mg | ORAL_TABLET | Freq: Every day | ORAL | Status: DC
  Administered 2017-12-22 – 2017-12-26 (×5): 45 mg via ORAL
  Filled 2017-12-22 (×2): qty 3
  Filled 2017-12-22: qty 1
  Filled 2017-12-22 (×3): qty 3

## 2017-12-22 MED ORDER — FLUTICASONE PROPIONATE 50 MCG/ACT NA SUSP
2.0000 | Freq: Every day | NASAL | Status: DC | PRN
  Filled 2017-12-22: qty 16

## 2017-12-22 MED ORDER — INSULIN ASPART 100 UNIT/ML ~~LOC~~ SOLN: 0.0000 [IU] | Freq: Every day | SUBCUTANEOUS | Status: DC

## 2017-12-22 MED ORDER — BISOPROLOL FUMARATE 5 MG PO TABS
5.0000 mg | ORAL_TABLET | Freq: Every day | ORAL | Status: DC
  Administered 2017-12-23 – 2017-12-27 (×5): 5 mg via ORAL
  Filled 2017-12-22 (×5): qty 1

## 2017-12-22 MED ORDER — PANCRELIPASE (LIP-PROT-AMYL) 36000-114000 UNITS PO CPEP
72000.0000 [IU] | ORAL_CAPSULE | Freq: Three times a day (TID) | ORAL | Status: DC
  Administered 2017-12-23 – 2017-12-27 (×14): 72000 [IU] via ORAL
  Filled 2017-12-22 (×2): qty 6
  Filled 2017-12-22 (×3): qty 2
  Filled 2017-12-22 (×2): qty 6
  Filled 2017-12-22 (×2): qty 2
  Filled 2017-12-22: qty 6
  Filled 2017-12-22 (×2): qty 2
  Filled 2017-12-22: qty 6
  Filled 2017-12-22: qty 2

## 2017-12-22 MED ORDER — FUROSEMIDE 10 MG/ML IJ SOLN
60.0000 mg | Freq: Once | INTRAMUSCULAR | Status: AC
  Administered 2017-12-22: 60 mg via INTRAVENOUS
  Filled 2017-12-22: qty 6

## 2017-12-22 MED ORDER — ATORVASTATIN CALCIUM 40 MG PO TABS
40.0000 mg | ORAL_TABLET | Freq: Every day | ORAL | Status: DC
  Administered 2017-12-23 – 2017-12-27 (×5): 40 mg via ORAL
  Filled 2017-12-22 (×6): qty 1

## 2017-12-22 MED ORDER — SERTRALINE HCL 50 MG PO TABS
50.0000 mg | ORAL_TABLET | Freq: Every day | ORAL | Status: DC
  Administered 2017-12-22 – 2017-12-26 (×5): 50 mg via ORAL
  Filled 2017-12-22 (×5): qty 1

## 2017-12-22 MED ORDER — VITAMIN C 500 MG PO TABS
1000.0000 mg | ORAL_TABLET | Freq: Every day | ORAL | Status: DC
  Administered 2017-12-23 – 2017-12-27 (×5): 1000 mg via ORAL
  Filled 2017-12-22 (×5): qty 2

## 2017-12-22 MED ORDER — IPRATROPIUM-ALBUTEROL 0.5-2.5 (3) MG/3ML IN SOLN
3.0000 mL | Freq: Four times a day (QID) | RESPIRATORY_TRACT | Status: DC
  Administered 2017-12-22 – 2017-12-23 (×2): 3 mL via RESPIRATORY_TRACT
  Filled 2017-12-22 (×3): qty 3

## 2017-12-22 MED ORDER — NITROGLYCERIN 0.4 MG SL SUBL: 0.4000 mg | SUBLINGUAL_TABLET | SUBLINGUAL | Status: DC | PRN

## 2017-12-22 MED ORDER — OMEGA-3-ACID ETHYL ESTERS 1 G PO CAPS
2.0000 g | ORAL_CAPSULE | Freq: Two times a day (BID) | ORAL | Status: DC
  Administered 2017-12-22 – 2017-12-27 (×11): 2 g via ORAL
  Filled 2017-12-22 (×10): qty 2

## 2017-12-22 MED ORDER — INSULIN ASPART 100 UNIT/ML ~~LOC~~ SOLN
10.0000 [IU] | Freq: Once | SUBCUTANEOUS | Status: AC
  Administered 2017-12-22: 10 [IU] via SUBCUTANEOUS

## 2017-12-22 MED ORDER — ISOSORBIDE MONONITRATE ER 30 MG PO TB24
30.0000 mg | ORAL_TABLET | Freq: Every day | ORAL | Status: DC
  Administered 2017-12-22 – 2017-12-27 (×6): 30 mg via ORAL
  Filled 2017-12-22 (×6): qty 1

## 2017-12-22 NOTE — Telephone Encounter (Signed)
Noted.

## 2017-12-22 NOTE — ED Notes (Signed)
Placed pt on 3L [place] due to oxygen saturations being 86% on RA. Pt in NAD, denies feeling SOB.

## 2017-12-22 NOTE — Progress Notes (Signed)
Hyperglycemic event  CBG: 528  Action: paged MD for orders. Gave 10 units of insulin aspart and will recheck.  CBG: 464  Notes/comments: patient on iv solumedrol. Will continue to monitor patient.

## 2017-12-22 NOTE — ED Provider Notes (Signed)
[facility] Emergency Department Provider Note MRN:  [id number]  Arrival date & time: 12/22/17     Chief Complaint   Respiratory Distress   History of Present Illness   Joshua [name] is a 77 y.o. year-old male with a history of COPD, CAD presenting to the ED with chief complaint of shortness of breath.  Gradual onset shortness of breath that began this morning.  Patient denies any recent cough or cold-like symptoms, no recent fever.  Progressively worsening shortness of breath.  Received multiple breathing treatments from EMS as well as Solu-Medrol.  Reportedly satting in the 70s to 73s at home.  Denies chest pain, no abdominal pain.  Review of Systems  A complete 10 system review of systems was obtained and all systems are negative except as noted in the HPI and PMH.   Patient's Health History    Past Medical History:  Diagnosis Date  . Acute respiratory distress 06/04/2016  . Adenomatous colon polyp 10/16/2011   Repeat 2018  . Arthritis     Hips, R>L & KNEES  . CAD, multiple vessel    3V CAD cath 08/08/13----CABG done shortly after.  Cath 06/2016: clear bipasses/no interventional lesion.  . 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 pylori neg, no dysplasia or malignancy.  . Chronic combined systolic and diastolic CHF, NYHA class 2 ([place]) 2016-2018   Followed by [facility] clinic.  Transesoph Echo 09/2016 EF improved to 40-45%; diffuse hypokinesis, Grd I DD.  09/2017 EF 50-55%, grd I DD.  [name] Chronic hypoxemic respiratory failure (HCC)    HISTORY OF(2018): oxygen testing normal in office 11/10/17, no home oxygen was not recertified.  . Chronic renal insufficiency, stage 3 (moderate) ([place]) 2018   GFR 50s  . COPD (chronic obstructive pulmonary disease) ([place])    GOLD II.  Spirometry  2004 borderline obstruction; 2015 mod obst: noncompliant with bid symbicort so pulmonologist switched him to a once daily inhaler: Trelegy ellipta 12/2015.   Oxygen prn: goal 88-92%.  . Diabetic nephropathy ([place])    Elevated urine microalb/cr 03/2011  . DM type 2 (diabetes mellitus, type 2) ([place])    Poor control on max oral meds--pt eventually agreed to insulin therapy.  As of 2017 his DM is being managed by Dr. Dwyane Dee in endo.  On insulin pump as of 04/2017 endo f/u.  [name] DOE (dyspnea on exertion)    COPD + chronic diastolic HF  . Dysthymia    Worsened after 2015 heart surgery.  Memory loss +? as well.  Neuro eval 01/2017--plan is to get MRI brain, B12 level, and neuropsychiatric testing.  . Erectile dysfunction    Normal testosterone  . Hearing loss of both ears 2016   Hearing aids  . Hyperplastic colon polyp 2001   No further colonoscopies to be done as of 11/2016 GI eval.  . Hypertension   . IBS (irritable bowel syndrome)    responds relatively well to prn levsin.  Pepto bismol q AM trial by Dr. Loletha Carrow 08/2017, also checking fecal elastace and lactoferrin.  . Iron deficiency anemia 2014   03/2012 capsule endoscopy showed 2 AVMs--likely responsible for his IDA--lifetime iron supp recommended + q25moCBCs.  . Ischemic cardiomyopathy 06/2016; 09/2016   06/2016: EF 25-30%, global hypokinesis, grd II DD, vent septum motion changes c/w LBBB, mod MV regurg, L atr mod/sev dilat, R atr mod dilat, mod incr pulm press, Grd II DD.  09/2016--EF 40-45%; diffuse hypokinesis, Grd I DD.  .[name]  Macular degeneration, dry    Mild, bilat (Optometrist, Mayford Knife at [facility] in [place], [place])  . Mild cognitive impairment    MCI, likley vascular etiology, + MDD, recurrent-->neuropsych testing 06/2017.  Neurol to repeat testing 1 yr  . Mixed hyperlipidemia    statin and vascepa as of 04/2017  . Nocturnal hypoxemia   . Obesity   . Open toe wound 11/2015   Wound care clinic appt made and then canceled when toe improved.  . Other and unspecified angina pectoris   . PAD (peripheral artery disease) ([place]) 02/2016   Abnormal ABI's and waveforms: LE arterial duplex  ordered for f/u as per cardiologist's recommendation.  Vasc eval by Dr. Bridgett Larsson: impression was minimal PAD, recommended maximize med mgmt.  [name] PAF (paroxysmal atrial fibrillation) ([place]) 10/2014   xarelto  . Pericardial effusion with cardiac tamponade 6//29/15   pericardiocentesis was done,  Infectious/inflamm (cytology showed NO MALIGNANT CELLS)  . Pleural plaque    Pleural plaques/asbestosis changes on CT chest done by pulm 05/2016.  [name] Pneumonia   . Tobacco dependence in remission    100+ pack-yr hx: quit after CABG    Past Surgical History:  Procedure Laterality Date  . APPENDECTOMY  1957  . CARDIAC CATHETERIZATION  08/08/2013  . CATARACT EXTRACTION W/ INTRAOCULAR LENS  IMPLANT, BILATERAL  04/08/2006 & 04/22/2006  . CATARACT EXTRACTION W/ INTRAOCULAR LENS  IMPLANT, BILATERAL Bilateral   . CHOLECYSTECTOMY OPEN  1999  . COLONOSCOPY  10/16/2011   Procedure: COLONOSCOPY;  Surgeon: Inda Castle, MD;  Location: [facility];  Service: Endoscopy;  Laterality: N/A;  . CORONARY ARTERY BYPASS GRAFT N/A 08/14/2013   Procedure: CORONARY ARTERY BYPASS GRAFTING (CABG);  Surgeon: Melrose Nakayama, MD;  Location: [place];  Service: Open Heart Surgery;  Laterality: N/A;  Times 4   using left internal mammary artery and endoscopically harvested bilateral saphenous vein  . ESOPHAGOGASTRODUODENOSCOPY  02/25/12   Atrophic gastritis with a few erosions--capsule endoscopy planned as of 02/25/12 (Dr. Deatra Ina).  [name] HARDWARE REMOVAL Right 08/12/2012   Procedure: HARDWARE REMOVAL;  Surgeon: Mcarthur Rossetti, MD;  Location: [facility];  Service: Orthopedics;  Laterality: Right;  . HIP SURGERY Right 1954   Repair of slipped capital femoral epiphysis.  . INTRAOPERATIVE TRANSESOPHAGEAL ECHOCARDIOGRAM N/A 08/14/2013   Normal LV function. Procedure: INTRAOPERATIVE TRANSESOPHAGEAL ECHOCARDIOGRAM;  Surgeon: Melrose Nakayama, MD;  Location: [place];  Service: Open Heart Surgery;  Laterality: N/A;  . LEFT HEART CATHETERIZATION  WITH CORONARY ANGIOGRAM N/A 08/08/2013   Procedure: LEFT HEART CATHETERIZATION WITH CORONARY ANGIOGRAM;  Surgeon: Jettie Booze, MD;  [facility] CATH LAB;  Service: Cardiovascular;  Laterality: N/A;  . PATELLA FRACTURE SURGERY Left ~ 1979   bolt + 3 screws to repair tib plateau fx  . PERICARDIAL TAP N/A 07/01/2013   Procedure: PERICARDIAL TAP;  Surgeon: Jettie Booze, MD;  Location: [facility] CATH LAB;  Service: Cardiovascular;  Laterality: N/A;  . RIGHT/LEFT HEART CATH AND CORONARY ANGIOGRAPHY N/A 06/10/2016   Procedure: Right/Left Heart Cath and Coronary Angiography;  Surgeon: Larey Dresser, MD;  Location: [place] CV LAB;  Service: Cardiovascular;  Laterality: N/A;  . TESTICLE SURGERY  as a child   Undescended testicle brought down into scrotum  . TONSILLECTOMY  1947  . TOTAL HIP ARTHROPLASTY Right 08/12/2012   Procedure: REMOVAL OF OLD PINS RIGHT HIP AND RIGHT TOTAL HIP ARTHROPLASTY ANTERIOR APPROACH;  Surgeon: Mcarthur Rossetti, MD;  Location: [facility];  Service: Orthopedics;  Laterality: Right;  . TRANSESOPHAGEAL ECHOCARDIOGRAM  09/22/2016   EF 40-45%; diffuse hypokinesis, Grd I DD.  [name] TRANSTHORACIC ECHOCARDIOGRAM  10/30/14   Mod LVH, EF 60-65%, normal wall motion, mod mitral regurg, mild PAH  . TRANSTHORACIC ECHOCARDIOGRAM  06/2016; 10/01/17   06/2016: EF 25-30%, global hypokinesis, grd II DD, vent septum motion changes c/w LBBB, mod MV regurg, L atr mod/sev dilat, R atr mod dilat, mod incr pulm press, Grd II DD.  09/2017: EF 50-55%, Hypokinesis of the basal inferolateral wall, grd I DD, mild MR, +LAE.    Family History  Problem Relation Age of Onset  . Heart disease Father   . Heart attack Father   . CVA Mother   . Hypertension Mother   . Diabetes Paternal Grandmother   . Breast cancer Sister   . Diabetes Maternal Uncle   . Heart disease Maternal Uncle   . Stroke Neg Hx     Social History   Socioeconomic History  . Marital status: Married    Spouse name: [name]  .  Number of children: 4  . Years of education: 10  . Highest education level: Not on file  Occupational History  . Occupation: retired, former Scientist, forensic  Social Needs  . Financial resource strain: Not hard at all  . Food insecurity:    Worry: Never true    Inability: Never true  . Transportation needs:    Medical: No    Non-medical: No  Tobacco Use  . Smoking status: Current Every Day Smoker    Packs/day: 1.00    Years: 64.00    Pack years: 64.00    Types: Cigarettes    Last attempt to quit: 10/23/2015    Years since quitting: 2.1  . Smokeless tobacco: Former Systems developer    Quit date: 06/30/2013  Substance and Sexual Activity  . Alcohol use: No    Alcohol/week: 0.0 standard drinks  . Drug use: No  . Sexual activity: Never  Lifestyle  . Physical activity:    Days per week: Not on file    Minutes per session: Not on file  . Stress: Not on file  Relationships  . Social connections:    Talks on phone: Not on file    Gets together: Not on file    Attends religious service: Not on file    Active member of club or organization: Not on file    Attends meetings of clubs or organizations: Not on file    Relationship status: Not on file  . Intimate partner violence:    Fear of current or ex partner: Not on file    Emotionally abused: Not on file    Physically abused: Not on file    Forced sexual activity: Not on file  Other Topics Concern  . Not on file  Social History Narrative   Married, 4 children.   Formerly a Medical illustrator.   Level of education: HS.     +tobacco--lifelong/ quit 2015).  No alcohol or drugs.   No exercise.  +excessive caffeine.      Lives in 1 story home with his wife and her sister     Physical Exam  Vital Signs and Nursing Notes reviewed Vitals:   12/22/17 1611 12/22/17 1615  BP: 124/66 (!) 121/51  Pulse: 74 71  Resp: 18 18  Temp:    SpO2: 93% 92%    CONSTITUTIONAL: Well-appearing, NAD NEURO:  Alert and oriented x 3, no focal  deficits EYES:  eyes  equal and reactive ENT/NECK:  no LAD, no JVD CARDIO: Regular rate, well-perfused, normal S1 and S2 PULM: Few scattered wheezes, largely normal lung sounds GI/GU:  normal bowel sounds, non-distended, non-tender MSK/SPINE:  No gross deformities, no edema SKIN:  no rash, atraumatic PSYCH:  Appropriate speech and behavior  Diagnostic and Interventional Summary    EKG Interpretation  Date/Time:  Wednesday December 22 2017 12:44:34 EST Ventricular Rate:  88 PR Interval:    QRS Duration: 163 QT Interval:  424 QTC Calculation: 513 R Axis:   -24 Text Interpretation:  Sinus rhythm Prolonged PR interval Left bundle branch block Confirmed by Gerlene Fee [phone]) on 12/22/2017 1:27:17 PM      Labs Reviewed  CBC - Abnormal; Notable for the following components:      Result Value   WBC 11.1 (*)    MCHC 29.7 (*)    RDW 15.6 (*)    All other components within normal limits  BASIC METABOLIC PANEL - Abnormal; Notable for the following components:   Glucose, Bld 181 (*)    All other components within normal limits  BRAIN NATRIURETIC PEPTIDE - Abnormal; Notable for the following components:   B Natriuretic Peptide 661.3 (*)    All other components within normal limits  I-STAT CG4 LACTIC ACID, ED - Abnormal; Notable for the following components:   Lactic Acid, Venous 2.37 (*)    All other components within normal limits  I-STAT TROPONIN, ED    DG Chest 2 View  Final Result      Medications - No data to display   Procedures Critical Care Critical Care Documentation Critical care time provided by me (excluding procedures): 37 minutes  Condition necessitating critical care: Hypoxic respiratory failure  Components of critical care management: reviewing of prior records, laboratory and imaging interpretation, frequent re-examination and reassessment of vital signs, administration of supplemental oxygen, discussion with consulting services.    ED Course and  Medical Decision Making  I have reviewed the triage vital signs and the nursing notes.  Pertinent labs & imaging results that were available during my care of the patient were reviewed by me and considered in my medical decision making (see below for details).  COPD versus CHF, much improved after duo nebs and steroids given by EMS.  Patient requiring 3 L nasal cannula to maintain saturations in the low 90s, desaturated to 86% during ambulation.  Labs revealing possible mixed CHF and COPD exacerbation, admitted to hospital service for further care.  Barth Kirks. Sedonia Small, [place] [email or web address]  Final Clinical Impressions(s) / ED Diagnoses     ICD-10-CM   1. COPD exacerbation ([place]) J44.1   2. SOB (shortness of breath) R06.02 DG Chest 2 View    DG Chest 2 View  3. Acute respiratory failure with hypoxia [facility]) J96.01     ED Discharge Orders    None         Maudie Flakes, MD 12/22/17 1649

## 2017-12-22 NOTE — H&P (Signed)
TRH H&P   Patient Demographics:    Joshua [name], is a 77 y.o. male  MRN: [id number]   DOB - October 12, 1940  Admit Date - 12/22/2017  Outpatient Primary MD for the patient is McGowen, Adrian Blackwater, MD  Referring MD/NP/PA: Dr Sedonia Small  Outpatient Specialists: cardiology Dr Scarlette Calico and [facility]  Patient coming from: Home  Chief Complaint  Patient presents with  . Respiratory Distress      HPI:    Joshua [name]  is a 77 y.o. male, with PMH of chronic systolic/diastolic CHF, COPD, history of asbestos exposure, HTN, HLD, and DM. Unable to tolerate Entresto due to lightheadedness/hypotension.  Add on ARB or Aldactone secondary to recurrent hyperkalemia, patient presents with complaints of shortness of breath, patient reports acute onset of dyspnea, at 4:00 this morning, daughter at bedside help with the history, apparently patient with progressive dyspnea over the last 7 to 10 days, with increased hypoxia with pulse ox checked at home, one point he went to fire station where he was kept on oxygen at their facility and sent home from the ER, he was noted by EMS to be hypoxic in the 70s, he received 3 nebs, and 1 Solu-Medrol in route, patient report weight gain, 8 pounds over last week, compliance with salt restriction, but not with fluid restriction, compliant with diet as well, reports orthopnea, exertional dyspnea, no cough, no productive sputum, no fever, no chills, no chest pain - in ED patient with elevated proBNP, chest x-ray with vascular congestion, hypoxic requiring oxygen, I was called to admit    Review of systems:    In addition to the HPI above,  No Fever-chills, No Headache, No changes with Vision or hearing, No problems swallowing food or Liquids, No Chest pain, Cough, reports progressive dyspnea No Abdominal pain, No Nausea or Vommitting, Bowel movements are regular, No Blood in stool  or Urine, No dysuria, No new skin rashes or bruises, No new joints pains-aches,  No new weakness, tingling, numbness in any extremity, No recent weight gain or loss, No polyuria, polydypsia or polyphagia, No significant Mental Stressors.  A full 10 point Review of Systems was done, except as stated above, all other Review of Systems were negative.   With Past History of the following :    Past Medical History:  Diagnosis Date  . Acute respiratory distress 06/04/2016  . Adenomatous colon polyp 10/16/2011   Repeat 2018  . Arthritis     Hips, R>L & KNEES  . CAD, multiple vessel    3V CAD cath 08/08/13----CABG done shortly after.  Cath 06/2016: clear bipasses/no interventional lesion.  . 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 pylori neg, no dysplasia or malignancy.  . Chronic combined systolic and diastolic CHF, NYHA class 2 ([place]) 2016-2018   Followed by [facility].  Transesoph Echo 09/2016 EF improved to 40-45%; diffuse hypokinesis,  Grd I DD.  09/2017 EF 50-55%, grd I DD.  [name] Chronic hypoxemic respiratory failure (HCC)    HISTORY OF(2018): oxygen testing normal in office 11/10/17, no home oxygen was not recertified.  . Chronic renal insufficiency, stage 3 (moderate) ([place]) 2018   GFR 50s  . COPD (chronic obstructive pulmonary disease) ([place])    GOLD II.  Spirometry  2004 borderline obstruction; 2015 mod obst: noncompliant with bid symbicort so pulmonologist switched him to a once daily inhaler: Trelegy ellipta 12/2015.  Oxygen prn: goal 88-92%.  . Diabetic nephropathy ([place])    Elevated urine microalb/cr 03/2011  . DM type 2 (diabetes mellitus, type 2) ([place])    Poor control on max oral meds--pt eventually agreed to insulin therapy.  As of 2017 his DM is being managed by Dr. Dwyane Dee in endo.  On insulin pump as of 04/2017 endo f/u.  [name] DOE (dyspnea on exertion)    COPD + chronic diastolic HF  . Dysthymia    Worsened after 2015 heart surgery.  Memory  loss +? as well.  Neuro eval 01/2017--plan is to get MRI brain, B12 level, and neuropsychiatric testing.  . Erectile dysfunction    Normal testosterone  . Hearing loss of both ears 2016   Hearing aids  . Hyperplastic colon polyp 2001   No further colonoscopies to be done as of 11/2016 GI eval.  . Hypertension   . IBS (irritable bowel syndrome)    responds relatively well to prn levsin.  Pepto bismol q AM trial by Dr. Loletha Carrow 08/2017, also checking fecal elastace and lactoferrin.  . Iron deficiency anemia 2014   03/2012 capsule endoscopy showed 2 AVMs--likely responsible for his IDA--lifetime iron supp recommended + q68moCBCs.  . Ischemic cardiomyopathy 06/2016; 09/2016   06/2016: EF 25-30%, global hypokinesis, grd II DD, vent septum motion changes c/w LBBB, mod MV regurg, L atr mod/sev dilat, R atr mod dilat, mod incr pulm press, Grd II DD.  09/2016--EF 40-45%; diffuse hypokinesis, Grd I DD.  . Macular degeneration, dry    Mild, bilat (Optometrist, DMayford Knifeat [place] [place]  . Mild cognitive impairment    MCI, likley vascular etiology, + MDD, recurrent-->neuropsych testing 06/2017.  Neurol to repeat testing 1 yr  . Mixed hyperlipidemia    statin and vascepa as of 04/2017  . Nocturnal hypoxemia   . Obesity   . Open toe wound 11/2015   Wound care clinic appt made and then canceled when toe improved.  . Other and unspecified angina pectoris   . PAD (peripheral artery disease) ([place] 02/2016   Abnormal ABI's and waveforms: LE arterial duplex ordered for f/u as per cardiologist's recommendation.  Vasc eval by Dr. CBridgett Larsson impression was minimal PAD, recommended maximize med mgmt.  .Marland KitchenPAF (paroxysmal atrial fibrillation) ([place] 10/2014   xarelto  . Pericardial effusion with cardiac tamponade 6//29/15   pericardiocentesis was done,  Infectious/inflamm (cytology showed NO MALIGNANT CELLS)  . Pleural plaque    Pleural plaques/asbestosis changes on CT chest done by pulm 05/2016.    .Marland KitchenPneumonia   . Tobacco dependence in remission    100+ pack-yr hx: quit after CABG      Past Surgical History:  Procedure Laterality Date  . APPENDECTOMY  1957  . CARDIAC CATHETERIZATION  08/08/2013  . CATARACT EXTRACTION W/ INTRAOCULAR LENS  IMPLANT, BILATERAL  04/08/2006 & 04/22/2006  . CATARACT EXTRACTION W/ INTRAOCULAR LENS  IMPLANT, BILATERAL Bilateral   . CHOLECYSTECTOMY OPEN  1999  .  COLONOSCOPY  10/16/2011   Procedure: COLONOSCOPY;  Surgeon: Inda Castle, MD;  Location: [facility];  Service: Endoscopy;  Laterality: N/A;  . CORONARY ARTERY BYPASS GRAFT N/A 08/14/2013   Procedure: CORONARY ARTERY BYPASS GRAFTING (CABG);  Surgeon: Melrose Nakayama, MD;  Location: [facility];  Service: Open Heart Surgery;  Laterality: N/A;  Times 4   using left internal mammary artery and endoscopically harvested bilateral saphenous vein  . ESOPHAGOGASTRODUODENOSCOPY  02/25/12   Atrophic gastritis with a few erosions--capsule endoscopy planned as of 02/25/12 (Dr. Deatra Ina).  [name] HARDWARE REMOVAL Right 08/12/2012   Procedure: HARDWARE REMOVAL;  Surgeon: Mcarthur Rossetti, MD;  Location: [facility];  Service: Orthopedics;  Laterality: Right;  . HIP SURGERY Right 1954   Repair of slipped capital femoral epiphysis.  . INTRAOPERATIVE TRANSESOPHAGEAL ECHOCARDIOGRAM N/A 08/14/2013   Normal LV function. Procedure: INTRAOPERATIVE TRANSESOPHAGEAL ECHOCARDIOGRAM;  Surgeon: Melrose Nakayama, MD;  Location: [facility];  Service: Open Heart Surgery;  Laterality: N/A;  . LEFT HEART CATHETERIZATION WITH CORONARY ANGIOGRAM N/A 08/08/2013   Procedure: LEFT HEART CATHETERIZATION WITH CORONARY ANGIOGRAM;  Surgeon: Jettie Booze, MD;  Location: [facility] CATH LAB;  Service: Cardiovascular;  Laterality: N/A;  . PATELLA FRACTURE SURGERY Left ~ 1979   bolt + 3 screws to repair tib plateau fx  . PERICARDIAL TAP N/A 07/01/2013   Procedure: PERICARDIAL TAP;  Surgeon: Jettie Booze, MD;  Location: [facility] CATH LAB;  Service:  Cardiovascular;  Laterality: N/A;  . RIGHT/LEFT HEART CATH AND CORONARY ANGIOGRAPHY N/A 06/10/2016   Procedure: Right/Left Heart Cath and Coronary Angiography;  Surgeon: Larey Dresser, MD;  Location: [place] CV LAB;  Service: Cardiovascular;  Laterality: N/A;  . TESTICLE SURGERY  as a child   Undescended testicle brought down into scrotum  . TONSILLECTOMY  1947  . TOTAL HIP ARTHROPLASTY Right 08/12/2012   Procedure: REMOVAL OF OLD PINS RIGHT HIP AND RIGHT TOTAL HIP ARTHROPLASTY ANTERIOR APPROACH;  Surgeon: Mcarthur Rossetti, MD;  Location: [facility];  Service: Orthopedics;  Laterality: Right;  . TRANSESOPHAGEAL ECHOCARDIOGRAM  09/22/2016   EF 40-45%; diffuse hypokinesis, Grd I DD.  [name] TRANSTHORACIC ECHOCARDIOGRAM  10/30/14   Mod LVH, EF 60-65%, normal wall motion, mod mitral regurg, mild PAH  . TRANSTHORACIC ECHOCARDIOGRAM  06/2016; 10/01/17   06/2016: EF 25-30%, global hypokinesis, grd II DD, vent septum motion changes c/w LBBB, mod MV regurg, L atr mod/sev dilat, R atr mod dilat, mod incr pulm press, Grd II DD.  09/2017: EF 50-55%, Hypokinesis of the basal inferolateral wall, grd I DD, mild MR, +LAE.      Social History:     Social History   Tobacco Use  . Smoking status: Current Every Day Smoker    Packs/day: 1.00    Years: 64.00    Pack years: 64.00    Types: Cigarettes    Last attempt to quit: 10/23/2015    Years since quitting: 2.1  . Smokeless tobacco: Former Systems developer    Quit date: 06/30/2013  Substance Use Topics  . Alcohol use: No    Alcohol/week: 0.0 standard drinks     Lives -at home with wife  Mobility -independent     Family History :     Family History  Problem Relation Age of Onset  . Heart disease Father   . Heart attack Father   . CVA Mother   . Hypertension Mother   . Diabetes Paternal Grandmother   . Breast cancer Sister   . Diabetes Maternal Uncle   .  Heart disease Maternal Uncle   . Stroke Neg Hx      Home Medications:   Prior to Admission  medications   Medication Sig Start Date End Date Taking? Authorizing Provider  acetaminophen (TYLENOL) 325 MG tablet Take 2 tablets (650 mg total) by mouth every 4 (four) hours as needed for headache or mild pain. 08/09/13  Yes Kilroy, Doreene Burke, PA-C  Ascorbic Acid (VITAMIN C) 1000 MG tablet Take 1,000 mg by mouth daily.   Yes [provider]  atorvastatin (LIPITOR) 40 MG tablet TAKE ONE TABLET BY MOUTH DAILY 12/07/17  Yes McGowen, Adrian Blackwater, MD  bisoprolol (ZEBETA) 5 MG tablet Take 1 tablet (5 mg total) by mouth daily. 11/26/17 11/26/18 Yes McGowen, Adrian Blackwater, MD  Cholecalciferol (VITAMIN D-3) 1000 units CAPS Take 1,000 Units by mouth daily. Reported on 02/26/2015   Yes [provider]  fluticasone (FLONASE) 50 MCG/ACT nasal spray Place 2 sprays into both nostrils daily. Patient taking differently: Place 2 sprays into both nostrils as needed.  06/04/16  Yes McGowen, Adrian Blackwater, MD  gabapentin (NEURONTIN) 100 MG capsule TAKE TWO CAPSULES BY MOUTH THREE TIMES DAILY Patient taking differently: Take 300 mg by mouth 2 (two) times daily.  08/09/17  Yes McGowen, Adrian Blackwater, MD  guaiFENesin (MUCINEX) 600 MG 12 hr tablet Take 2 tablets (1,200 mg total) by mouth 2 (two) times daily. 11/02/14  Yes Verlee Monte, MD  insulin regular human CONCENTRATED (HUMULIN R) 500 UNIT/ML injection Inject 0.7m under the skin 3 times daily. Patient taking differently: Inject 8.5-10 Units into the skin 3 (three) times daily with meals. Using 10 units morning, afternoon, and 8.5 units at bedtime. 12/15/17  Yes KElayne Snare MD  isosorbide mononitrate (IMDUR) 30 MG 24 hr tablet TAKE ONE TABLET BY MOUTH EVERY DAY Patient taking differently: Take 30 mg by mouth daily.  06/09/17  Yes McGowen, PAdrian Blackwater MD  lipase/protease/amylase (CREON) 36000 UNITS CPEP capsule Take 2 capsules (72,000 Units total) by mouth 3 (three) times daily with meals. 11/10/17  Yes Danis, HKirke Corin MD  mirtazapine (REMERON) 45 MG tablet Take 1 tablet (45  mg total) by mouth at bedtime. 10/19/17  Yes McGowen, PAdrian Blackwater MD  Multiple Vitamin (MULTIVITAMIN WITH MINERALS) TABS Take 1 tablet by mouth daily.   Yes [provider]  nitroGLYCERIN (NITROSTAT) 0.4 MG SL tablet PLACE 1 TABLET UNDER THE TONGUE EVERY 5 MINUTES AS NEEDED FOR CHEST PAIN Patient taking differently: Place 0.4 mg under the tongue every 5 (five) minutes as needed for chest pain.  03/30/17  Yes MLarey Dresser MD  PROAIR HFA 108 [phone]) MCG/ACT inhaler INHALE 2 PUFFS INTO THE LUNGS EVERY 4 HOURS AS NEEDED FOR WHEEZING ORSHORTNESS OF BREATH Patient taking differently: Inhale 2 puffs into the lungs every 4 (four) hours as needed for wheezing or shortness of breath.  06/05/16  Yes McGowen, PAdrian Blackwater MD  sertraline (ZOLOFT) 25 MG tablet Take 2 tablets (50 mg total) by mouth at bedtime. 10/08/17  Yes McGowen, PAdrian Blackwater MD  sodium zirconium cyclosilicate (LOKELMA) 10 g PACK packet Take 10 g by mouth daily. 10/08/17  Yes Clegg, Amy D, NP  SYMBICORT 160-4.5 MCG/ACT inhaler INHALE 2 PUFFS INTO THE LUNGS TWICE DAILY Patient taking differently: Inhale 2 puffs into the lungs 2 (two) times daily.  12/01/16  Yes McGowen, PAdrian Blackwater MD  SYNJARDY 05-998 MG TABS TAKE ONE TABLET BY MOUTH TWICE DAILY AFTER A MEAL Patient taking differently: Take 1 tablet by mouth 2 (  two) times daily after a meal.  11/20/17  Yes Elayne Snare, MD  tamsulosin (FLOMAX) 0.4 MG CAPS capsule TAKE ONE CAPSULE BY MOUTH DAILY AFTER SUPPER 10/29/17  Yes McGowen, Adrian Blackwater, MD  torsemide (DEMADEX) 20 MG tablet Take 20 mg every other day 10/28/17  Yes Clegg, Amy D, NP  VASCEPA 1 g CAPS TAKE TWO CAPSULES BY MOUTH TWICE DAILY 10/28/17  Yes Elayne Snare, MD  XARELTO 20 MG TABS tablet TAKE ONE TABLET BY MOUTH DAILY WITH SUPPER Patient taking differently: Take 20 mg by mouth daily with supper.  10/28/17  Yes Larey Dresser, MD  ACCU-CHEK GUIDE test strip USE TO CHECK BLOOD SUGAR 3 TIMES DAILY 07/27/16   McGowen, Adrian Blackwater, MD      Allergies:     Allergies  Allergen Reactions  . Morphine And Related Other (See Comments)    Drenched with perspiration  . Entresto [Sacubitril-Valsartan]     hypotension  . Demerol [Meperidine] Nausea Only  . Starlix [Nateglinide] Other (See Comments)    gassy     Physical Exam:   Vitals  Blood pressure (!) 121/51, pulse 71, temperature 98.2 F (36.8 C), temperature source Oral, resp. rate 18, height _0  (1.803 m), weight 107 kg, SpO2 92 %.   1. General this male, laying in bed, in mild discomfort secondary to dyspnea, sitting in bed  2. Normal affect and insight, Not Suicidal or Homicidal, Awake Alert, Oriented X 3.  3. No F.N deficits, ALL C.Nerves Intact, Strength 5/5 all 4 extremities, Sensation intact all 4 extremities, Plantars down going.  4. Ears and Eyes appear Normal, Conjunctivae clear, PERRLA. Moist Oral Mucosa.  5. Supple Neck, positive JVD, No cervical lymphadenopathy appriciated, No Carotid Bruits.  6. Symmetrical Chest wall movement, increased work of breathing, tachypneic, bibasilar crackles  7. RRR, No Gallops, Rubs or Murmurs, No Parasternal Heave.  8. Positive Bowel Sounds, Abdomen Soft, No tenderness, No organomegaly appriciated,No rebound -guarding or rigidity.  9.  No Cyanosis, Normal Skin Turgor, No Skin Rash or Bruise.  10. Good muscle tone,  joints appear normal , no effusions, Normal ROM.  +2 edema  11. No Palpable Lymph Nodes in Neck or Axillae    Data Review:    CBC Recent Labs  Lab 12/22/17 1256  WBC 11.1*  HGB 14.5  HCT 48.8  PLT 213  MCV 90.2  MCH 26.8  MCHC 29.7*  RDW 15.6*   ------------------------------------------------------------------------------------------------------------------  Chemistries  Recent Labs  Lab 12/16/17 0852 12/22/17 1256  NA 141 142  K 4.6 4.2  CL 108 104  CO2 26 27  GLUCOSE 75 181*  BUN 18 15  CREATININE 0.78 0.87  CALCIUM 8.7 9.0    ------------------------------------------------------------------------------------------------------------------ estimated creatinine clearance is 87.1 mL/min (by C-G formula based on SCr of 0.87 mg/dL). ------------------------------------------------------------------------------------------------------------------ No results for input(s): TSH, T4TOTAL, T3FREE, THYROIDAB in the last 72 hours.  Invalid input(s): FREET3  Coagulation profile No results for input(s): INR, PROTIME in the last 168 hours. ------------------------------------------------------------------------------------------------------------------- No results for input(s): DDIMER in the last 72 hours. -------------------------------------------------------------------------------------------------------------------  Cardiac Enzymes No results for input(s): CKMB, TROPONINI, MYOGLOBIN in the last 168 hours.  Invalid input(s): CK ------------------------------------------------------------------------------------------------------------------    Component Value Date/Time   BNP 661.3 (H) 12/22/2017 1540     ---------------------------------------------------------------------------------------------------------------  Urinalysis    Component Value Date/Time   COLORURINE YELLOW 09/30/2017 [place] 09/30/2017 1235   LABSPEC 1.008 09/30/2017 1235   PHURINE 5.0 09/30/2017 1235   GLUCOSEU NEGATIVE 09/30/2017  [place] 09/30/2017 [place] 09/30/2017 [place] 09/30/2017 1235   PROTEINUR NEGATIVE 09/30/2017 1235   UROBILINOGEN 0.2 08/11/2013 1624   NITRITE NEGATIVE 09/30/2017 1235   LEUKOCYTESUR NEGATIVE 09/30/2017 1235    ----------------------------------------------------------------------------------------------------------------   Imaging Results:    Dg Chest 2 View  Result Date: 12/22/2017 CLINICAL DATA:  One day of shortness of breath.  History of COPD, pneumonia, former smoker. Also atrial fibrillation. EXAM: CHEST - 2 VIEW COMPARISON:  Portable chest x-ray of October 01, 2017 FINDINGS: The lungs are well-expanded. The interstitial markings are mildly increased. The heart is top-normal in size. The pulmonary vascularity is prominent centrally with mild cephalization. There are small pleural effusions blunting the posterior costophrenic angles. There is calcification in the wall of the aortic arch. The patient has undergone previous CABG. The bony thorax exhibits no acute abnormality. There is dense calcification of the anterolateral longitudinal ligaments. IMPRESSION: COPD with superimposed mild CHF. There small bilateral pleural effusions layering posteriorly. No acute pneumonia. Thoracic aortic atherosclerosis. DISH. Electronically Signed   By: David  Martinique M.D.   On: 12/22/2017 13:29    My personal review of EKG: Rhythm NSR, Rate  88 /min, QTc 513 , with known old left bundle branch block   Assessment & Plan:    Active Problems:   HTN (hypertension), benign   Hyperlipidemia   COPD (chronic obstructive pulmonary disease) (HCC)   CKD (chronic kidney disease) stage 3, GFR 30-59 ml/min (HCC)   Diabetes mellitus with complication (HCC)   COPD exacerbation (HCC)   Acute respiratory failure with hypoxia (HCC)   Acute hypoxic respiratory failure -At baseline on room air, presents with progressive hypoxia, increased work of breathing, tachypnea, appears to be multifactorial in setting of COPD exacerbation and acute on chronic systolic CHF -Significantly hypoxic on room air on exertion, continue with oxygen, wean as tolerated, but I think likely he will end up being discharged with home oxygen -Treated with pulmonary toilet, sedative spirometry, add Mucinex  COPD exacerbation -Wheezing was noted by EMS, he received 3 nebulizer treatment, and steroids, currently wheezing significantly subsided, he is tachypneic, I will start on  IV Solu-Medrol 80 every 8 hours, scheduled duo nebs, continue with home Symbicort,  Acute on chronic diastolic/systolic CHF with nonischemic cardiomyopathy -Recent echo September 2019, showing preserved EF 55%, with grade 1 diastolic dysfunction, root from previous EF September of last year which was 40 to 45%, as well it was 25 to 30% in 2018 -Reports 8 to 10 pounds weight gain recently, has bibasilar crackles, elevated proBNP, chest x-ray significant for vascular congestion reports diet compliance, but not fluid restriction compliance -Continue with his low-dose bisoprolol, no ARB/spironolactone in the setting of recurrent hyperkalemia -Continue with daily weight, strict ins and out, continue with IV Lasix 40 mg IV twice daily, will give 1 dose now -  Calma is on hold given he is on IV Lasix, can be resumed if he has recurrent hyper kalemia  Paroxysmal A. Fib -Continue with Xarelto for anticoagulation, continue with bisoprolol  History of CAD -Post CABG, he denies any chest pain, he is on Xarelto, does not need to be on aspirin, on low-dose beta-blockers, statin  History of recurrent hyperkalemia -Better closely, Lokelma on hold, no ARB/ Aldactone  Diabetes mellitus -Patient used to be on Lantus in the past, currently not taking any, he is on Synjardy and Humulin before meals, will hold Synjardy and keep on sliding scale during hospital  stay  Hyperlipidemia -Continue with VASCEPA and statin  Tobacco abuse -Was counseled  DVT Prophylaxis on Xarelto  AM Labs Ordered, also please review Full Orders  Family Communication: Admission, patients condition and plan of care including tests being ordered have been discussed with the patient and family who indicate understanding and agree with the plan and Code Status.  Code Status DNR, confirmed by patient, daughter at bedside  Likely DC to Home  Condition GUARDED    Consults called: none  Admission status: INPATIENT, patient will  be admitted under inpatient status, as it is expected that he will need more than 2 midnight stays, till he is appropriately diuresed, given +2 edema, and significant weight gain(8 pounds over last week), given his significant hypoxia, he was saturating in the 70s on room air at home, baseline he does not require any oxygen.  Time spent in minutes : 60 MINUTES   Phillips Climes M.D on 12/22/2017 at 5:21 PM  Between 7am to 7pm - Pager - [phone]. After 7pm go to [email or web address] - password [facility]  [facility] - Office  [phone]

## 2017-12-22 NOTE — ED Notes (Signed)
Pt stands to ambulate using walker without assistance. Pt walks ~100 paces down hall and back. Sat settles @ 86%. Pt steady on feet and does not 'feel out of breath'. Upon sitting Sat returns to 91% without supplemental O2.

## 2017-12-22 NOTE — Telephone Encounter (Signed)
rec'd call from pt's wife.  Stated the pt. Had shortness of breath during night and used one of his Inhalers.  Stated he woke up at 5:00 AM with continued shortness of breath; O2 Sat. 85%.  Reported his son took him to the [employer]. At about 10:00 AM to get Oxygen.  Stated the pt. Refused to go to the hospital.  Wife stated the pt. did not c/o chest pain or cough, prior to going to the [employer].    During call, the pt. Returned home.  Son got on phone and gave update.  Reported the pt. Was given O2 @ 3 liters/ [place] while at [employer]., and O2 Sat. Got up to 98%.  Reported the pt. Is doing better.  Questioned what his O2 Sat is reading at present time; son reported it is jumping around from 78-80%; per O2 monitor pt's. Pulse is 96.  Stated the pt. denied chest pain, is alert, skin dry, and denied cough.  Reported swelling in lower legs.  Denied any recent illness of Upper Resp. Infection.  Stated the pt's wife has had Upper Resp. symptoms.  Advised that the pt. Needs to have EMS called at this time.  Son stated he thought he could transport the pt. By car.  Advised against taking him by car with his O2 as low as it is.  Son requested Triage nurse to call EMS.  Called 911 to dispatch EMS to pt's home for shortness of breath and low O2 sat.            Reason for Disposition . SEVERE difficulty breathing (e.g., struggling for each breath, speaks in single words, pulse > 120)    Report that pt. Began having SOB during night; at present, reported pt. Is short of breath and O2 sat. Is 78-80% on room air.  Advised to call EMS.  NT called EMS for pt., per son's request.  Answer Assessment - Initial Assessment Questions 1. MAIN CONCERN OR SYMPTOM : "What's your main concern?" (e.g., low oxygen level, breathing difficulty) "What question do you have?"     O2 Saturation 85 %  2. ONSET: "When did the  sx's  start?"      During night noted pt. Was using his inhaler about 3:00 AM  3. OXYGEN THERAPY:    - "Do you  currently use home oxygen?" (e.g., yes, no).    - If yes, "What is your oxygen source?" (e.g., O2 tank, O2 concentrator).    - If yes, "How do you get the oxygen?" (e.g., nasal prongs, face mask).    - If yes, "How much oxygen are you supposed to use?" (e.g., 1-2 L [place])     No; does not use O2 at home  4. PULSE OXIMETER:    - "Do you have a pulse oximeter (pulse ox)?"  (e.g., yes, no)    - If yes, "Where do you place the probe?" (e.g., fingertip, ear lobe)     5:00 AM O2 level at 82% 5. O2 MONITORING: "What is the oxygen level (pulse ox reading)?" (e.g., 70-100%) 6: VSS MONITORING "Do you monitor/measure your oxygen level or vital signs (e.g., yes, no, measurements are automatically sent to call center). Document CURRENT and NORMAL BASELINE values if available.     -  O2 SAT: "What is the oxygen level (pulse ox reading)?" (e.g., 70-100%)   -  P: "What is your pulse rate per minute?"   -  RR: "What is your respiratory  rate per minute?"     Per son: pulse ox reading 78-80 %; pulse 96 7. BREATHING DIFFICULTY: "Are you having any difficulty breathing?" If so, ask "How bad is it?"  (e.g., none, mild, moderate, severe)   - MILD: No SOB at rest, mild SOB with walking, speaks normally in sentences, able to lie down, no retractions, pulse < 100.   - MODERATE: SOB at rest, SOB with minimal exertion and prefers to sit, cannot lie down flat, speaks in phrases, mild retractions, audible wheezing, pulse 100-120.   - SEVERE: Very SOB at rest, speaks in single words, struggling to breathe, sitting hunched forward, retractions, pulse > 120     Severe per pt's wife.  8. OTHER SYMPTOMS: "Do you have any other symptoms?" (e.g., fever, change in sputum)     Denied that pt. had any complaints  9. SMOKING: "Do you smoke currently?" (Note: smoking around oxygen is dangerous!)     *No Answer*  Protocols used: COPD OXYGEN MONITORING AND HYPOXIA-A-AH

## 2017-12-22 NOTE — ED Notes (Signed)
Lab to run BNP now

## 2017-12-22 NOTE — ED Triage Notes (Signed)
Pt arrives via EMS with reports of respiratory distress. Pt from home with COPD CHF, over past week pt oxygen saturations have been lower (mid to upper 80's) but has gone as low as 72% on RA. Pt does not wear oxygen at home. At approx 0400 pt awoke feeling sob, used inhaler and used inhaler again at 0700. 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 Pt refused transport at that time. Pt oxygen saturation remained low and pt became sob again, called EMS. Given 81m albuterol, 1 mg atrovent and 1291msolumedrol en route.

## 2017-12-23 DIAGNOSIS — N183 Chronic kidney disease, stage 3 (moderate): Secondary | ICD-10-CM

## 2017-12-23 LAB — RESPIRATORY PANEL BY PCR

## 2017-12-23 LAB — BASIC METABOLIC PANEL
Anion gap: 12 (ref 5–15)
BUN: 31 mg/dL — ABNORMAL HIGH (ref 8–23)
CO2: 24 mmol/L (ref 22–32)
Calcium: 8.9 mg/dL (ref 8.9–10.3)
Chloride: 101 mmol/L (ref 98–111)
Creatinine, Ser: 1.23 mg/dL (ref 0.61–1.24)
GFR calc Af Amer: >60 mL/min (ref >60)
GFR calc non Af Amer: 56 mL/min — ABNORMAL LOW (ref >60)
Glucose, Bld: 479 mg/dL — ABNORMAL HIGH (ref 70–99)
Potassium: 4.3 mmol/L (ref 3.5–5.1)
Sodium: 137 mmol/L (ref 135–145)

## 2017-12-23 LAB — GLUCOSE, CAPILLARY
GLUCOSE-CAPILLARY: 331 mg/dL — AB (ref 70–99)
Glucose-Capillary: 464 mg/dL — ABNORMAL HIGH (ref 70–99)
Glucose-Capillary: 404 mg/dL — ABNORMAL HIGH (ref 70–99)
Glucose-Capillary: 377 mg/dL — ABNORMAL HIGH (ref 70–99)
Glucose-Capillary: 379 mg/dL — ABNORMAL HIGH (ref 70–99)
Glucose-Capillary: 247 mg/dL — ABNORMAL HIGH (ref 70–99)

## 2017-12-23 LAB — INFLUENZA PANEL BY PCR (TYPE A & B)
INFLBPCR: NEGATIVE
Influenza A By PCR: NEGATIVE

## 2017-12-23 MED ORDER — INSULIN REGULAR HUMAN (CONC) 500 UNIT/ML ~~LOC~~ SOPN
50.0000 [IU] | PEN_INJECTOR | Freq: Two times a day (BID) | SUBCUTANEOUS | Status: DC
  Administered 2017-12-23 – 2017-12-27 (×9): 50 [IU] via SUBCUTANEOUS
  Filled 2017-12-23: qty 3

## 2017-12-23 MED ORDER — SODIUM CHLORIDE 3 % IN NEBU
4.0000 mL | INHALATION_SOLUTION | Freq: Three times a day (TID) | RESPIRATORY_TRACT | Status: DC
  Administered 2017-12-23 – 2017-12-25 (×5): 4 mL via RESPIRATORY_TRACT
  Filled 2017-12-23 (×8): qty 4

## 2017-12-23 MED ORDER — INSULIN ASPART 100 UNIT/ML ~~LOC~~ SOLN
10.0000 [IU] | Freq: Once | SUBCUTANEOUS | Status: AC
  Administered 2017-12-23: 10 [IU] via SUBCUTANEOUS

## 2017-12-23 MED ORDER — INSULIN REGULAR HUMAN (CONC) 500 UNIT/ML ~~LOC~~ SOPN
40.0000 [IU] | PEN_INJECTOR | Freq: Every day | SUBCUTANEOUS | Status: DC
  Administered 2017-12-23 – 2017-12-26 (×4): 40 [IU] via SUBCUTANEOUS

## 2017-12-23 MED ORDER — IPRATROPIUM-ALBUTEROL 0.5-2.5 (3) MG/3ML IN SOLN
3.0000 mL | Freq: Four times a day (QID) | RESPIRATORY_TRACT | Status: DC
  Administered 2017-12-23 – 2017-12-24 (×5): 3 mL via RESPIRATORY_TRACT
  Filled 2017-12-23 (×5): qty 3

## 2017-12-23 MED ORDER — INSULIN REGULAR HUMAN (CONC) 500 UNIT/ML ~~LOC~~ SOLN: 8.5000 [IU] | Freq: Three times a day (TID) | SUBCUTANEOUS | Status: DC

## 2017-12-23 MED ORDER — INSULIN ASPART 100 UNIT/ML ~~LOC~~ SOLN
0.0000 [IU] | Freq: Every day | SUBCUTANEOUS | Status: DC
  Administered 2017-12-23: 2 [IU] via SUBCUTANEOUS

## 2017-12-23 MED ORDER — INSULIN ASPART 100 UNIT/ML ~~LOC~~ SOLN
0.0000 [IU] | Freq: Three times a day (TID) | SUBCUTANEOUS | Status: DC
  Administered 2017-12-23: 15 [IU] via SUBCUTANEOUS
  Administered 2017-12-24: 11 [IU] via SUBCUTANEOUS
  Administered 2017-12-24: 15 [IU] via SUBCUTANEOUS

## 2017-12-23 MED ORDER — IPRATROPIUM-ALBUTEROL 0.5-2.5 (3) MG/3ML IN SOLN: 3.0000 mL | Freq: Two times a day (BID) | RESPIRATORY_TRACT | Status: DC

## 2017-12-23 NOTE — Progress Notes (Signed)
Inpatient Diabetes Program Recommendations  AACE/ADA: New Consensus Statement on Inpatient Glycemic Control (2015)  Target Ranges:  Prepandial:   less than 140 mg/dL      Peak postprandial:   less than 180 mg/dL (1-2 hours)      Critically ill patients:  140 - 180 mg/dL   Lab Results  Component Value Date   GLUCAP 377 (H) 12/23/2017   HGBA1C 9.0 (H) 10/01/2017    Review of Glycemic Control Results for Joshua [name], Joshua [name]" (MRN [id number]) as of 12/23/2017 10:22  Ref. Range 12/22/2017 22:19 12/23/2017 02:04 12/23/2017 05:32 12/23/2017 07:47  Glucose-Capillary Latest Ref Range: 70 - 99 mg/dL 528 (HH) 464 (H) 404 (H) 377 (H)   Diabetes history: DM2 Outpatient Diabetes medications: U500 insulin 10 units ac breakfast, 10 units ac lunch + 8.5 units pm Current orders for Inpatient glycemic control: Novolog sensitive correction scale + hs  Inpatient Diabetes Program Recommendations:   -Add U 500 10 units breakfast, 10 units lunch + 8.5 units pm -Increase Novolog correction to moderate Text page sent to Dr. Nevada Crane with recommendations.  Thank you, Nani Gasser. Jermany Rimel, RN, MSN, CDE  Diabetes Coordinator Inpatient Glycemic Control Team Team Pager [phone] (8am-5pm) 12/23/2017 10:26 AM

## 2017-12-23 NOTE — Progress Notes (Signed)
Patients CBG 464 post 10 units of insulin aspart. Paged MD, got orders for 10 units of insulin aspart (20 units total) and a diabetes coordinator consult. Patient states feeling "fine", just "thristy". BP 131/52, HR 74, 95% o2sats, temp 98.0. Will continue to monitor patient.

## 2017-12-23 NOTE — Progress Notes (Signed)
Pharmacy Home Medication Reconciliation Communication High Risk Medication: Humulin U-500 Insulin  Home dose of Humulin U-500 insulin was reordered for this patient. The dose was verified with the patient as listed below:  Type of syringe used at home:  U100 syringe Number or line on syringe to which patient draws up insulin: 10 units (0.16m) and 8.5 units (0.08379m  This equates to 50 units (0.79m6mand 42.5 units (0.085 ml) of U-500 insulin  Other comments pertinent to patient home dosing: I have updated the home med rec to the correct units.  CarSherlon HandingharmD, BCPS Clinical pharmacist  **Pharmacist phone directory can now be found on [facility] [facility] (PW [email or web address]).  Listed under [facility] [place]2/19/2019  11:14 AM

## 2017-12-23 NOTE — Progress Notes (Signed)
PROGRESS NOTE  Joshua [name] VQQ:[id number] DOB: 07-15-1940 DOA: 12/22/2017 PCP: Tammi Sou, MD  HPI/Recap of past 24 hours:  Joshua [name]  is a 77 y.o. male, with PMH of combined chronic systolic/diastolic CHF, COPD, history of asbestos exposure, HTN, HLD, and DM. Unable to tolerate Entresto due to lightheadedness/hypotension.  Add on ARB or Aldactone secondary to recurrent hyperkalemia, patient presents with complaints of shortness of breath,  7 to 10 days, with increased hypoxia with pulse ox checked at home. In ED patient with elevated proBNP, chest x-ray with vascular congestion, hypoxic requiring oxygen, [facility] called to admit.  12/23/2017: Patient seen and examined at his bedside.  Reports persistent dyspnea with minimal movement.  Reports was previously on 2 L of oxygen by nasal cannula continuously at baseline and was taken off his O2 supplementation at home for about a month by his provider.  Assessment/Plan: Active Problems:   HTN (hypertension), benign   Hyperlipidemia   COPD (chronic obstructive pulmonary disease) (HCC)   CKD (chronic kidney disease) stage 3, GFR 30-59 ml/min (HCC)   Diabetes mellitus with complication (HCC)   COPD exacerbation (HCC)   Acute respiratory failure with hypoxia (HCC)  Acute on chronic hypoxic respiratory failure suspect multifactorial secondary to acute COPD exacerbation versus acute on chronic combined diastolic and systolic CHF Continue diuresing Continue strict I's and O's and daily weight Continue IV Solu-Medrol 3 times daily and Lasix 40 mg IV twice daily Decrease Solu-Medrol dose from 80 mg 3 times daily to 60 mg 3 times daily Maintain O2 saturation greater than 92% Start pulmonary toilet with Mucinex 1200 mg twice daily and hypersaline nebs 3 times daily Start duo nebs every 6 hours Obtain respiratory viral panel and influenza a and B PCR  Acute on chronic combined diastolic and systolic CHF Continue cardiac medications Strict I's  and O's and daily weight  Paroxysmal A. fib Continue Xarelto for primary CVA prophylaxis Continue bisoprolol for rate control  History of coronary artery disease status post CABG Denies chest pain Continue cardiac medications including bisoprolol, aspirin and statin  Type 2 diabetes Obtain A1c Continue insulin home regimen  Tobacco abuse Tobacco cessation counseling at bedside   Code Status: DNR  Family Communication: None at bedside  Disposition Plan: Home in 1 to 2 days when O2 demand is close to his baseline   Consultants:  None  Procedures:  None  Antimicrobials:  None  DVT prophylaxis: Xarelto   Objective: Vitals:   12/22/17 2017 12/23/17 0235 12/23/17 0530 12/23/17 1438  BP:  (!) 131/52 (!) 132/55 (!) 112/46  Pulse:   76 66  Resp:  _0 Temp:  98 F (36.7 C) 97.9 F (36.6 C) 97.6 F (36.4 C)  TempSrc:  Oral Oral Oral  SpO2: 94% 95% 94% 95%  Weight:   104.6 kg   Height:        Intake/Output Summary (Last 24 hours) at 12/23/2017 1605 Last data filed at 12/23/2017 1400 Gross per 24 hour  Intake 1146 ml  Output 1325 ml  Net -179 ml   Filed Weights   12/22/17 1245 12/23/17 0530  Weight: 107 kg 104.6 kg    Exam:  . General: 77 y.o. year-old male well developed well nourished in no acute distress.  Alert and oriented x3. . Cardiovascular: Regular rate and rhythm with no rubs or gallops.  No thyromegaly or JVD noted.   [name] Respiratory: Mild rales at bases with diffuse wheezes.  Poor inspiratory effort. [name]  Abdomen: Soft nontender nondistended with normal bowel sounds x4 quadrants. . Musculoskeletal: Trace lower extremity edema. 2/4 pulses in all 4 extremities. [name] Psychiatry: Mood is appropriate for condition and setting   Data Reviewed: CBC: Recent Labs  Lab 12/22/17 1256  WBC 11.1*  HGB 14.5  HCT 48.8  MCV 90.2  PLT 626   Basic Metabolic Panel: Recent Labs  Lab 12/22/17 1256 12/23/17 0318  NA 142 137  K 4.2 4.3  CL 104  101  CO2 27 24  GLUCOSE 181* 479*  BUN 15 31*  CREATININE 0.87 1.23  CALCIUM 9.0 8.9   GFR: Estimated Creatinine Clearance: 60.9 mL/min (by C-G formula based on SCr of 1.23 mg/dL). Liver Function Tests: No results for input(s): AST, ALT, ALKPHOS, BILITOT, PROT, ALBUMIN in the last 168 hours. No results for input(s): LIPASE, AMYLASE in the last 168 hours. No results for input(s): AMMONIA in the last 168 hours. Coagulation Profile: No results for input(s): INR, PROTIME in the last 168 hours. Cardiac Enzymes: No results for input(s): CKTOTAL, CKMB, CKMBINDEX, TROPONINI in the last 168 hours. BNP (last 3 results) No results for input(s): PROBNP in the last 8760 hours. HbA1C: No results for input(s): HGBA1C in the last 72 hours. CBG: Recent Labs  Lab 12/22/17 2219 12/23/17 0204 12/23/17 0532 12/23/17 0747 12/23/17 1118  GLUCAP 528* 464* 404* 377* 331*   Lipid Profile: No results for input(s): CHOL, HDL, LDLCALC, TRIG, CHOLHDL, LDLDIRECT in the last 72 hours. Thyroid Function Tests: No results for input(s): TSH, T4TOTAL, FREET4, T3FREE, THYROIDAB in the last 72 hours. Anemia Panel: No results for input(s): VITAMINB12, FOLATE, FERRITIN, TIBC, IRON, RETICCTPCT in the last 72 hours. Urine analysis:    Component Value Date/Time   COLORURINE YELLOW 09/30/2017 [place] 09/30/2017 1235   LABSPEC 1.008 09/30/2017 1235   PHURINE 5.0 09/30/2017 1235   GLUCOSEU NEGATIVE 09/30/2017 1235   HGBUR NEGATIVE 09/30/2017 1235   BILIRUBINUR NEGATIVE 09/30/2017 1235   KETONESUR NEGATIVE 09/30/2017 1235   PROTEINUR NEGATIVE 09/30/2017 1235   UROBILINOGEN 0.2 08/11/2013 1624   NITRITE NEGATIVE 09/30/2017 1235   LEUKOCYTESUR NEGATIVE 09/30/2017 1235   Sepsis Labs: _0 (procalcitonin:4,lacticidven:4)  )No results found for this or any previous visit (from the past 240 hour(s)).    Studies: No results found.  Scheduled Meds: . atorvastatin  40 mg Oral Daily  .  bisoprolol  5 mg Oral Daily  . cholecalciferol  1,000 Units Oral Daily  . furosemide  40 mg Intravenous BID  . gabapentin  300 mg Oral BID  . guaiFENesin  1,200 mg Oral BID  . insulin aspart  0-15 Units Subcutaneous TID WC  . insulin aspart  0-5 Units Subcutaneous QHS  . insulin regular human CONCENTRATED  40 Units Subcutaneous Q supper  . insulin regular human CONCENTRATED  50 Units Subcutaneous BID WC  . ipratropium-albuterol  3 mL Nebulization BID  . isosorbide mononitrate  30 mg Oral Daily  . lipase/protease/amylase  72,000 Units Oral TID WC  . mouth rinse  15 mL Mouth Rinse BID  . methylPREDNISolone (SOLU-MEDROL) injection  80 mg Intravenous Q8H  . mirtazapine  45 mg Oral QHS  . mometasone-formoterol  2 puff Inhalation BID  . multivitamin with minerals  1 tablet Oral Daily  . omega-3 acid ethyl esters  2 g Oral BID  . rivaroxaban  20 mg Oral Q supper  . sertraline  50 mg Oral QHS  . tamsulosin  0.4 mg Oral QPC supper  . vitamin  C  1,000 mg Oral Daily    Continuous Infusions:   LOS: 1 day     Kayleen Memos, MD [facility] Pager [phone]  If 7PM-7AM, please contact night-coverage [email or web address] Password [email or web address] 12/23/2017, 4:05 PM

## 2017-12-24 LAB — CBC WITH DIFFERENTIAL/PLATELET
Abs Immature Granulocytes: 0.09 10*3/uL — ABNORMAL HIGH (ref 0.00–0.07)
BASOS PCT: 0 %
Basophils Absolute: 0 10*3/uL (ref 0.0–0.1)
Eosinophils Absolute: 0 10*3/uL (ref 0.0–0.5)
Eosinophils Relative: 0 %
HCT: 44.6 % (ref 39.0–52.0)
Hemoglobin: 13.4 g/dL (ref 13.0–17.0)
Immature Granulocytes: 1 %
Lymphocytes Relative: 6 %
Lymphs Abs: 0.8 10*3/uL (ref 0.7–4.0)
MCH: 26.4 pg (ref 26.0–34.0)
MCHC: 30 g/dL (ref 30.0–36.0)
MCV: 87.8 fL (ref 80.0–100.0)
MONOS PCT: 4 %
Monocytes Absolute: 0.5 10*3/uL (ref 0.1–1.0)
Neutro Abs: 10.8 10*3/uL — ABNORMAL HIGH (ref 1.7–7.7)
Neutrophils Relative %: 89 %
Platelets: 179 10*3/uL (ref 150–400)
RBC: 5.08 MIL/uL (ref 4.22–5.81)
RDW: 15.1 % (ref 11.5–15.5)
WBC: 12.2 10*3/uL — ABNORMAL HIGH (ref 4.0–10.5)
nRBC: 0 % (ref 0.0–0.2)

## 2017-12-24 LAB — BASIC METABOLIC PANEL
Anion gap: 13 (ref 5–15)
BUN: 46 mg/dL — ABNORMAL HIGH (ref 8–23)
CO2: 24 mmol/L (ref 22–32)
Calcium: 8.7 mg/dL — ABNORMAL LOW (ref 8.9–10.3)
Chloride: 100 mmol/L (ref 98–111)
Creatinine, Ser: 1.1 mg/dL (ref 0.61–1.24)
GFR calc Af Amer: >60 mL/min (ref >60)
GFR calc non Af Amer: >60 mL/min (ref >60)
Glucose, Bld: 338 mg/dL — ABNORMAL HIGH (ref 70–99)
Potassium: 4.3 mmol/L (ref 3.5–5.1)
Sodium: 137 mmol/L (ref 135–145)

## 2017-12-24 LAB — HEMOGLOBIN A1C
HEMOGLOBIN A1C: 6.8 % — AB (ref 4.8–5.6)
Mean Plasma Glucose: 148.46 mg/dL

## 2017-12-24 LAB — GLUCOSE, CAPILLARY
Glucose-Capillary: 309 mg/dL — ABNORMAL HIGH (ref 70–99)
Glucose-Capillary: 394 mg/dL — ABNORMAL HIGH (ref 70–99)
Glucose-Capillary: 289 mg/dL — ABNORMAL HIGH (ref 70–99)
Glucose-Capillary: 193 mg/dL — ABNORMAL HIGH (ref 70–99)

## 2017-12-24 LAB — PROCALCITONIN: Procalcitonin: <0.1 ng/mL

## 2017-12-24 MED ORDER — INSULIN ASPART 100 UNIT/ML ~~LOC~~ SOLN
0.0000 [IU] | Freq: Every day | SUBCUTANEOUS | Status: DC
  Administered 2017-12-25: 4 [IU] via SUBCUTANEOUS

## 2017-12-24 MED ORDER — COLCHICINE 0.6 MG PO TABS
0.6000 mg | ORAL_TABLET | Freq: Two times a day (BID) | ORAL | Status: DC
  Administered 2017-12-24 (×2): 0.6 mg via ORAL
  Filled 2017-12-24 (×3): qty 1

## 2017-12-24 MED ORDER — INSULIN ASPART 100 UNIT/ML ~~LOC~~ SOLN
0.0000 [IU] | Freq: Three times a day (TID) | SUBCUTANEOUS | Status: DC
  Administered 2017-12-24: 11 [IU] via SUBCUTANEOUS
  Administered 2017-12-25: 15 [IU] via SUBCUTANEOUS
  Administered 2017-12-25: 20 [IU] via SUBCUTANEOUS
  Administered 2017-12-25: 4 [IU] via SUBCUTANEOUS
  Administered 2017-12-26: 11 [IU] via SUBCUTANEOUS
  Administered 2017-12-26: 7 [IU] via SUBCUTANEOUS
  Administered 2017-12-27: 4 [IU] via SUBCUTANEOUS

## 2017-12-24 MED ORDER — METHYLPREDNISOLONE SODIUM SUCC 125 MG IJ SOLR
60.0000 mg | Freq: Two times a day (BID) | INTRAMUSCULAR | Status: DC
  Administered 2017-12-24 – 2017-12-25 (×2): 60 mg via INTRAVENOUS
  Filled 2017-12-24 (×2): qty 2

## 2017-12-24 NOTE — Progress Notes (Signed)
Results for DELOY, ARCHEY (MRN [id number]) as of 12/24/2017 12:44  Ref. Range 12/23/2017 11:18 12/23/2017 16:21 12/23/2017 21:17 12/24/2017 07:37 12/24/2017 11:37  Glucose-Capillary Latest Ref Range: 70 - 99 mg/dL 331 (H) 379 (H) 247 (H) 309 (H) 394 (H)  Noted that blood sugars continue to be elevated, especially with steroids. Recommend increasing U 500 insulin to 15 units (75) at breakfast, 15 units (75) at lunch, 10 units (50) at supper, increase correction scale to RESISTANT TID. Titrate dosage as needed.   If needed, could discontinue U-500 insulin and start Lantus 25 units (103 kg X 0.3 units/kg = 30.9 units total) especially while on steroids.   Harvel Ricks RN BSN CDE Diabetes Coordinator Pager: [phone]  8am-5pm

## 2017-12-24 NOTE — Progress Notes (Signed)
PROGRESS NOTE  Joshua [name]:[id number] DOB: 07/07/1940 DOA: 12/22/2017 PCP: Tammi Sou, MD  HPI/Recap of past 24 hours:  Joshua [name]  is a 77 y.o. male, with PMH of combined chronic systolic/diastolic CHF, COPD, history of asbestos exposure, HTN, HLD, and DM. Unable to tolerate Entresto due to lightheadedness/hypotension.  Add on ARB or Aldactone secondary to recurrent hyperkalemia, patient presents with complaints of shortness of breath,  7 to 10 days, with increased hypoxia with pulse ox checked at home. In ED patient with elevated proBNP, chest x-ray with vascular congestion, hypoxic requiring oxygen, [facility] called to admit.  12/23/2017: Patient seen and examined at his bedside.  Reports persistent dyspnea with minimal movement.  Reports was previously on 2 L of oxygen by nasal cannula continuously at baseline and was taken off his O2 supplementation at home for about a month by his provider.  12/24/17: Seen and examined at bedside. Persistent dyspnea with minimal movement. Cough is improving.  Assessment/Plan: Active Problems:   HTN (hypertension), benign   Hyperlipidemia   COPD (chronic obstructive pulmonary disease) (HCC)   CKD (chronic kidney disease) stage 3, GFR 30-59 ml/min (HCC)   Diabetes mellitus with complication (HCC)   COPD exacerbation (HCC)   Acute respiratory failure with hypoxia (HCC)  Acute on chronic hypoxic respiratory failure suspect multifactorial secondary to acute COPD exacerbation versus acute on chronic combined diastolic and systolic CHF Continue diuresing Continue strict I's and O's and daily weight Continue IV Solu-Medrol 60 mg bid and Lasix 40 mg IV twice daily Maintain O2 saturation greater than 92% c/w pulmonary toilet with Mucinex 1200 mg twice daily and hypersaline nebs 3 times daily C/w duo nebs every 6 hours Negative respiratory viral panel and influenza A and B PCR  Steroids induced hyperglycemia Increase ISS to severe scale Obtain  A1c Solumedrol dose being reduced  Acute on chronic combined diastolic and systolic CHF Continue cardiac medications Strict I's and O's and daily weight  Paroxysmal A. fib Continue Xarelto for primary CVA prophylaxis Continue bisoprolol for rate control  History of coronary artery disease status post CABG Denies chest pain Continue cardiac medications including bisoprolol, aspirin and statin  Type 2 diabetes Obtain A1c Continue insulin home regimen  Tobacco abuse Tobacco cessation counseling at bedside   Code Status: DNR  Family Communication: None at bedside  Disposition Plan: Home in 1 to 2 days when O2 demand is close to his baseline   Consultants:  None  Procedures:  None  Antimicrobials:  None  DVT prophylaxis: Xarelto   Objective: Vitals:   12/24/17 0115 12/24/17 0456 12/24/17 0803 12/24/17 0809  BP:  130/80    Pulse:  76    Resp:  16    Temp:  97.8 F (36.6 C)    TempSrc:  Oral    SpO2: 94% 90% 98% 98%  Weight:  103.2 kg    Height:        Intake/Output Summary (Last 24 hours) at 12/24/2017 1252 Last data filed at 12/24/2017 1138 Gross per 24 hour  Intake 540 ml  Output 1650 ml  Net -1110 ml   Filed Weights   12/22/17 1245 12/23/17 0530 12/24/17 0456  Weight: 107 kg 104.6 kg 103.2 kg    Exam:  . General: 77 y.o. year-old male WD WN appears mildly uncomfortable due to dyspnea with movements. A&O x 3. . Cardiovascular: RRR no rubs or gallops. No JVD or gallops.   [name] Respiratory: Mild rales at bases no wheezes. Poor  inspiratory efforts. . Abdomen: Soft nontender nondistended with normal bowel sounds x4 quadrants. . Musculoskeletal: Trace lower extremity edema. 2/4 pulses in all 4 extremities. [name] Psychiatry: Mood is appropriate for condition and setting   Data Reviewed: CBC: Recent Labs  Lab 12/22/17 1256 12/24/17 0504  WBC 11.1* 12.2*  NEUTROABS  --  10.8*  HGB 14.5 13.4  HCT 48.8 44.6  MCV 90.2 87.8  PLT 213 854   Basic  Metabolic Panel: Recent Labs  Lab 12/22/17 1256 12/23/17 0318 12/24/17 0504  NA 142 137 137  K 4.2 4.3 4.3  CL 104 101 100  CO2 _0 GLUCOSE 181* 479* 338*  BUN 15 31* 46*  CREATININE 0.87 1.23 1.10  CALCIUM 9.0 8.9 8.7*   GFR: Estimated Creatinine Clearance: 67.7 mL/min (by C-G formula based on SCr of 1.1 mg/dL). Liver Function Tests: No results for input(s): AST, ALT, ALKPHOS, BILITOT, PROT, ALBUMIN in the last 168 hours. No results for input(s): LIPASE, AMYLASE in the last 168 hours. No results for input(s): AMMONIA in the last 168 hours. Coagulation Profile: No results for input(s): INR, PROTIME in the last 168 hours. Cardiac Enzymes: No results for input(s): CKTOTAL, CKMB, CKMBINDEX, TROPONINI in the last 168 hours. BNP (last 3 results) No results for input(s): PROBNP in the last 8760 hours. HbA1C: No results for input(s): HGBA1C in the last 72 hours. CBG: Recent Labs  Lab 12/23/17 1118 12/23/17 1621 12/23/17 2117 12/24/17 0737 12/24/17 1137  GLUCAP 331* 379* 247* 309* 394*   Lipid Profile: No results for input(s): CHOL, HDL, LDLCALC, TRIG, CHOLHDL, LDLDIRECT in the last 72 hours. Thyroid Function Tests: No results for input(s): TSH, T4TOTAL, FREET4, T3FREE, THYROIDAB in the last 72 hours. Anemia Panel: No results for input(s): VITAMINB12, FOLATE, FERRITIN, TIBC, IRON, RETICCTPCT in the last 72 hours. Urine analysis:    Component Value Date/Time   COLORURINE YELLOW 09/30/2017 [place] 09/30/2017 1235   LABSPEC 1.008 09/30/2017 1235   PHURINE 5.0 09/30/2017 1235   GLUCOSEU NEGATIVE 09/30/2017 1235   HGBUR NEGATIVE 09/30/2017 1235   BILIRUBINUR NEGATIVE 09/30/2017 1235   KETONESUR NEGATIVE 09/30/2017 1235   PROTEINUR NEGATIVE 09/30/2017 1235   UROBILINOGEN 0.2 08/11/2013 1624   NITRITE NEGATIVE 09/30/2017 1235   LEUKOCYTESUR NEGATIVE 09/30/2017 1235   Sepsis Labs: _1 (procalcitonin:4,lacticidven:4)  ) Recent Results  (from the past 240 hour(s))  Respiratory Panel by PCR     Status: None   Collection Time: 12/23/17  6:02 PM  Result Value Ref Range Status   Adenovirus NOT DETECTED NOT DETECTED Final   Coronavirus 229E NOT DETECTED NOT DETECTED Final   Coronavirus HKU1 NOT DETECTED NOT DETECTED Final   Coronavirus NL63 NOT DETECTED NOT DETECTED Final   Coronavirus OC43 NOT DETECTED NOT DETECTED Final   Metapneumovirus NOT DETECTED NOT DETECTED Final   Rhinovirus / Enterovirus NOT DETECTED NOT DETECTED Final   Influenza A NOT DETECTED NOT DETECTED Final   Influenza B NOT DETECTED NOT DETECTED Final   Parainfluenza Virus 1 NOT DETECTED NOT DETECTED Final   Parainfluenza Virus 2 NOT DETECTED NOT DETECTED Final   Parainfluenza Virus 3 NOT DETECTED NOT DETECTED Final   Parainfluenza Virus 4 NOT DETECTED NOT DETECTED Final   Respiratory Syncytial Virus NOT DETECTED NOT DETECTED Final   Bordetella pertussis NOT DETECTED NOT DETECTED Final   Chlamydophila pneumoniae NOT DETECTED NOT DETECTED Final   Mycoplasma pneumoniae NOT DETECTED NOT DETECTED Final    Comment: Performed at [facility],  [address] [place], [place] [address]      Studies: No results found.  Scheduled Meds: . atorvastatin  40 mg Oral Daily  . bisoprolol  5 mg Oral Daily  . cholecalciferol  1,000 Units Oral Daily  . colchicine  0.6 mg Oral BID  . furosemide  40 mg Intravenous BID  . gabapentin  300 mg Oral BID  . guaiFENesin  1,200 mg Oral BID  . insulin aspart  0-15 Units Subcutaneous TID WC  . insulin aspart  0-5 Units Subcutaneous QHS  . insulin regular human CONCENTRATED  40 Units Subcutaneous Q supper  . insulin regular human CONCENTRATED  50 Units Subcutaneous BID WC  . ipratropium-albuterol  3 mL Nebulization Q6H  . isosorbide mononitrate  30 mg Oral Daily  . lipase/protease/amylase  72,000 Units Oral TID WC  . mouth rinse  15 mL Mouth Rinse BID  . methylPREDNISolone (SOLU-MEDROL) injection  80 mg Intravenous  Q8H  . mirtazapine  45 mg Oral QHS  . mometasone-formoterol  2 puff Inhalation BID  . multivitamin with minerals  1 tablet Oral Daily  . omega-3 acid ethyl esters  2 g Oral BID  . rivaroxaban  20 mg Oral Q supper  . sertraline  50 mg Oral QHS  . sodium chloride HYPERTONIC  4 mL Nebulization TID  . tamsulosin  0.4 mg Oral QPC supper  . vitamin C  1,000 mg Oral Daily    Continuous Infusions:   LOS: 2 days     Kayleen Memos, MD [facility] Pager [phone]  If 7PM-7AM, please contact night-coverage [email or web address] Password [facility] 12/24/2017, 12:52 PM

## 2017-12-24 NOTE — Care Management Important Message (Signed)
Important Message  Patient Details  Name: Joshua [name] MRN: [id number] Date of Birth: 07-20-1940   Medicare Important Message Given:  Yes    Barb Merino Morna Flud 12/24/2017, 3:56 PM

## 2017-12-24 NOTE — Evaluation (Signed)
Physical Therapy Evaluation & Discharge Patient Details Name: Joshua [name] MRN: [id number] DOB: 06-02-1940 Today's Date: 12/24/2017   History of Present Illness  Pt is a 77 y.o. male admitted 12/22/17 with SOB and increased hypoxia. CXR showed vascular congestion. Worked up for likely COPD versus CHF exacerbation. PMH includes CHF, COPD (on 2L O2 [place] at home), HTN, DM, CAD s/p CABG. a-fib.    Clinical Impression  Patient evaluated by Physical Therapy with no further acute PT needs identified. PTA, pt mod indep with rollator/RW; lives with wife available for 24/7 support. Today, pt mod indep ambulating with RW; SpO2 88-92% on 2L O2 [place], 1x down to 86% quickly returning to >88% with standing rest and deep breathing. Pt reports using pulse ox to monitor SpO2 at home. All education has been completed and the patient has no further questions. Acute PT is signing off. Thank you for this referral.    Follow Up Recommendations No PT follow up;Supervision - Intermittent    Equipment Recommendations  None recommended by PT    Recommendations for Other Services       Precautions / Restrictions Precautions Precautions: Fall Restrictions Weight Bearing Restrictions: No      Mobility  Bed Mobility Overal bed mobility: Modified Independent             General bed mobility comments: HOB slightly elevated  Transfers Overall transfer level: Modified independent Equipment used: Rolling walker (2 wheeled)                Ambulation/Gait Ambulation/Gait assistance: Modified independent (Device/Increase time) Gait Distance (Feet): 250 Feet Assistive device: Rolling walker (2 wheeled) Gait Pattern/deviations: Step-through pattern;Decreased stride length   Gait velocity interpretation: 1.31 - 2.62 ft/sec, indicative of limited community ambulator General Gait Details: Slow, steady amb mod indep with RW; only requiring assist to manage O2 tank/HR monitor lines. SpO2 88-92% on 2L O2 [place]  majority of walk; 1x down to 86%, quickly returning to >88% with standing rest and deep breathing  Stairs            Wheelchair Mobility    Modified Rankin (Stroke Patients Only)       Balance Overall balance assessment: Needs assistance   Sitting balance-Leahy Scale: Good       Standing balance-Leahy Scale: Fair Standing balance comment: Can static stand and take steps without UE support; dynamic stability improved with UE support                             Pertinent Vitals/Pain Pain Assessment: No/denies pain    Home Living Family/patient expects to be discharged to:: Private residence Living Arrangements: Spouse/significant other Available Help at Discharge: Family;Available 24 hours/day Type of Home: House Home Access: Ramped entrance     Home Layout: One level Home Equipment: Walker - 2 wheels;Walker - 4 wheels;Cane - single point;Grab bars - tub/shower;Bedside commode;Grab bars - toilet Additional Comments: Wears 2L O2 [place] baseline    Prior Function Level of Independence: Independent with assistive device(s)         Comments: Uses RW in the house, rollator for community ambulation     Hand Dominance        Extremity/Trunk Assessment   Upper Extremity Assessment Upper Extremity Assessment: Overall WFL for tasks assessed    Lower Extremity Assessment Lower Extremity Assessment: Overall WFL for tasks assessed       Communication   Communication: [facility]  Cognition Arousal/Alertness: Awake/alert  Behavior During Therapy: WFL for tasks assessed/performed Overall Cognitive Status: Within Functional Limits for tasks assessed                                        General Comments General comments (skin integrity, edema, etc.): Wife present    Exercises     Assessment/Plan    PT Assessment Patent does not need any further PT services  PT Problem List         PT Treatment Interventions      PT Goals (Current  goals can be found in the Care Plan section)  Acute Rehab PT Goals PT Goal Formulation: All assessment and education complete, DC therapy    Frequency     Barriers to discharge        Co-evaluation               AM-PAC PT "6 Clicks" Mobility  Outcome Measure Help needed turning from your back to your side while in a flat bed without using bedrails?: None Help needed moving from lying on your back to sitting on the side of a flat bed without using bedrails?: None Help needed moving to and from a bed to a chair (including a wheelchair)?: None Help needed standing up from a chair using your arms (e.g., wheelchair or bedside chair)?: None Help needed to walk in hospital room?: None Help needed climbing 3-5 steps with a railing? : A Little 6 Click Score: 23    End of Session Equipment Utilized During Treatment: Oxygen Activity Tolerance: Patient tolerated treatment well Patient left: in bed;with call bell/phone within reach;with family/visitor present Nurse Communication: Mobility status PT Visit Diagnosis: Other abnormalities of gait and mobility (R26.89)    Time: 6734-1937 PT Time Calculation (min) (ACUTE ONLY): 19 min   Charges:   PT Evaluation $PT Eval Moderate Complexity: [place], PT, DPT Acute Rehabilitation Services  Pager [phone] Office [place] 12/24/2017, 3:22 PM

## 2017-12-24 NOTE — Care Management Note (Signed)
Case Management Note  Patient Details  Name: [name] MRN: [id number] Date of Birth: 1940/09/30  Subjective/Objective:                    Action/Plan:  Spoke to patient and wife at bedside. Discussed home care and potential DC needs. They state that they have used [facility] in the past for [facility] and would like to use them again if patient needs to go home with oxygen. Patient's wife is currently or very recently had [facility] for [facility] PT and they would like [facility] for [facility] as well. They deny any barriers to accessing MD or medications at home. CM will continue to follow.   Expected Discharge Date:                  Expected Discharge Plan:     In-House Referral:     Discharge planning Services  CM Consult  Post Acute Care Choice:  Home Health Choice offered to:  Patient, Spouse  DME Arranged:    DME Agency:     HH Arranged:    HH Agency:     Status of Service:  In process, will continue to follow  If discussed at Long Length of Stay Meetings, dates discussed:    Additional Comments:  Carles Collet, RN 12/24/2017, 1:46 PM

## 2017-12-25 DIAGNOSIS — I5033 Acute on chronic diastolic (congestive) heart failure: Secondary | ICD-10-CM

## 2017-12-25 LAB — BASIC METABOLIC PANEL
Anion gap: 9 (ref 5–15)
BUN: 46 mg/dL — ABNORMAL HIGH (ref 8–23)
CHLORIDE: 103 mmol/L (ref 98–111)
CO2: 28 mmol/L (ref 22–32)
Calcium: 8.4 mg/dL — ABNORMAL LOW (ref 8.9–10.3)
Creatinine, Ser: 1.14 mg/dL (ref 0.61–1.24)
GFR calc Af Amer: >60 mL/min (ref >60)
GFR calc non Af Amer: >60 mL/min (ref >60)
Glucose, Bld: 151 mg/dL — ABNORMAL HIGH (ref 70–99)
POTASSIUM: 4.4 mmol/L (ref 3.5–5.1)
Sodium: 140 mmol/L (ref 135–145)

## 2017-12-25 LAB — CBC
HCT: 44.1 % (ref 39.0–52.0)
HEMOGLOBIN: 13.4 g/dL (ref 13.0–17.0)
MCH: 26.5 pg (ref 26.0–34.0)
MCHC: 30.4 g/dL (ref 30.0–36.0)
MCV: 87.3 fL (ref 80.0–100.0)
Platelets: 180 10*3/uL (ref 150–400)
RBC: 5.05 MIL/uL (ref 4.22–5.81)
RDW: 14.9 % (ref 11.5–15.5)
WBC: 11.9 10*3/uL — ABNORMAL HIGH (ref 4.0–10.5)
nRBC: 0 % (ref 0.0–0.2)

## 2017-12-25 LAB — GLUCOSE, CAPILLARY
Glucose-Capillary: 186 mg/dL — ABNORMAL HIGH (ref 70–99)
Glucose-Capillary: 504 mg/dL (ref 70–99)
Glucose-Capillary: 345 mg/dL — ABNORMAL HIGH (ref 70–99)
Glucose-Capillary: 329 mg/dL — ABNORMAL HIGH (ref 70–99)
Glucose-Capillary: 321 mg/dL — ABNORMAL HIGH (ref 70–99)

## 2017-12-25 MED ORDER — IPRATROPIUM-ALBUTEROL 0.5-2.5 (3) MG/3ML IN SOLN
3.0000 mL | Freq: Three times a day (TID) | RESPIRATORY_TRACT | Status: DC
  Administered 2017-12-25: 3 mL via RESPIRATORY_TRACT
  Filled 2017-12-25: qty 3

## 2017-12-25 MED ORDER — SODIUM CHLORIDE 3 % IN NEBU
4.0000 mL | INHALATION_SOLUTION | Freq: Two times a day (BID) | RESPIRATORY_TRACT | Status: DC
  Administered 2017-12-25 – 2017-12-27 (×4): 4 mL via RESPIRATORY_TRACT
  Filled 2017-12-25 (×5): qty 4

## 2017-12-25 MED ORDER — IPRATROPIUM-ALBUTEROL 0.5-2.5 (3) MG/3ML IN SOLN: 3.0000 mL | RESPIRATORY_TRACT | Status: DC | PRN

## 2017-12-25 MED ORDER — IPRATROPIUM-ALBUTEROL 0.5-2.5 (3) MG/3ML IN SOLN
3.0000 mL | Freq: Two times a day (BID) | RESPIRATORY_TRACT | Status: DC
  Administered 2017-12-25 – 2017-12-27 (×4): 3 mL via RESPIRATORY_TRACT
  Filled 2017-12-25 (×4): qty 3

## 2017-12-25 MED ORDER — PREDNISONE 20 MG PO TABS
20.0000 mg | ORAL_TABLET | Freq: Every day | ORAL | Status: DC
  Administered 2017-12-25 – 2017-12-27 (×3): 20 mg via ORAL
  Filled 2017-12-25 (×3): qty 1

## 2017-12-25 NOTE — Consult Note (Addendum)
Cardiology Consultation:   Patient ID: Joshua [name] MRN: [id number]; DOB: 1940/02/04  Admit date: 12/22/2017 Date of Consult: 12/25/2017  Primary Care Provider: Tammi Sou, MD Primary Cardiologist: Loralie Champagne, MD HF Primary Electrophysiologist:  None   Patient Profile:   Joshua [name] is a 77 y.o. male with a hx of combined diastolic/systolic CHF Ischemic cardiomyopathy, COPD, CAD with hx CABG and last cath with patent bypass grafts, HLD, PAF on xarelto DM-2, and HTN who is being seen today for the evaluation of HF at the request of Dr. Nevada Crane.  History of Present Illness:   Joshua [name] followed by Dr. [name] in the heart failure clinic last seen 10/2017 with hx of combined systolic and diastolic HF, ischemic cardiomyopathy, COPD, CAD with hx CABG and last cath with patent grafts 06/10/16 and mildly elevated Left and right filling pressures, preserved cardiac output, PAF on xarelto and had been maintaining SR, HLD and HTN now admitted 12/22/17 for acute respiratory distress with hypoxia in the 70s with EMS treatment of 3 Nebs, IV solumedrol.  Dyspnea had been increasing over several days.  He had 8 lb wt gain.      EKG I personally reviewed SR with LBBB and PACs, no change from 10/05/17 Tele I personally reviewed SR with PACs and STach with activity last pm up to 138.   Na 142, K+ 4.2, Cr 0.87, Hgb 14.5 WBC 11.1 BNP 661 lactic acid 2.37 Troponin poc 0.03    CXR  COPD with superimposed mild CHF. There small bilateral pleural effusions layering posteriorly. No acute pneumonia. Thoracic aortic atherosclerosis. DISH.  Pt continues with dyspnea with minimal movement  Last Echo 10/01/17 with EF 50-55%, G1 DD, mild MR, and hypokinesis of the basal inferolateral wall.  Pt on Lasix 40 mg BID and is neg 1444 and wt down from 107 kg (235 lbs) to 104 (228 lbs)  Cr today 1.14 up from 0.87 on admit   Currently he stated he does not feel enough of the fluid is gone.  His sp02 still  drops at times. No chest pain.  He still smokes < half a ppd -trying to cutback.- has not eaten more salt but has had increased of fluids.   Past Medical History:  Diagnosis Date  . Acute respiratory distress 06/04/2016  . Adenomatous colon polyp 10/16/2011   Repeat 2018  . Arthritis     Hips, R>L & KNEES  . CAD, multiple vessel    3V CAD cath 08/08/13----CABG done shortly after.  Cath 06/2016: clear bipasses/no interventional lesion.  . 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 pylori neg, no dysplasia or malignancy.  . Chronic combined systolic and diastolic CHF, NYHA class 2 ([name]) 2016-2018   Followed by [facility].  Transesoph Echo 09/2016 EF improved to 40-45%; diffuse hypokinesis, Grd I DD.  09/2017 EF 50-55%, grd I DD.  [name] Chronic hypoxemic respiratory failure (HCC)    HISTORY OF(2018): oxygen testing normal in office 11/10/17, no home oxygen was not recertified.  . Chronic renal insufficiency, stage 3 (moderate) ([place]) 2018   GFR 50s  . COPD (chronic obstructive pulmonary disease) ([place])    GOLD II.  Spirometry  2004 borderline obstruction; 2015 mod obst: noncompliant with bid symbicort so pulmonologist switched him to a once daily inhaler: Trelegy ellipta 12/2015.  Oxygen prn: goal 88-92%.  . Diabetic nephropathy ([place])    Elevated urine microalb/cr 03/2011  . DM type 2 (diabetes mellitus, type 2) ([place])  Poor control on max oral meds--pt eventually agreed to insulin therapy.  As of 2017 his DM is being managed by Dr. Dwyane Dee in endo.  On insulin pump as of 04/2017 endo f/u.  [name] DOE (dyspnea on exertion)    COPD + chronic diastolic HF  . Dysthymia    Worsened after 2015 heart surgery.  Memory loss +? as well.  Neuro eval 01/2017--plan is to get MRI brain, B12 level, and neuropsychiatric testing.  . Erectile dysfunction    Normal testosterone  . Hearing loss of both ears 2016   Hearing aids  . Hyperplastic colon polyp 2001   No further  colonoscopies to be done as of 11/2016 GI eval.  . Hypertension   . IBS (irritable bowel syndrome)    responds relatively well to prn levsin.  Pepto bismol q AM trial by Dr. Loletha Carrow 08/2017, also checking fecal elastace and lactoferrin.  . Iron deficiency anemia 2014   03/2012 capsule endoscopy showed 2 AVMs--likely responsible for his IDA--lifetime iron supp recommended + q63moCBCs.  . Ischemic cardiomyopathy 06/2016; 09/2016   06/2016: EF 25-30%, global hypokinesis, grd II DD, vent septum motion changes c/w LBBB, mod MV regurg, L atr mod/sev dilat, R atr mod dilat, mod incr pulm press, Grd II DD.  09/2016--EF 40-45%; diffuse hypokinesis, Grd I DD.  . Macular degeneration, dry    Mild, bilat (Optometrist, DMayford Knifeat [employer]  . Mild cognitive impairment    MCI, likley vascular etiology, + MDD, recurrent-->neuropsych testing 06/2017.  Neurol to repeat testing 1 yr  . Mixed hyperlipidemia    statin and vascepa as of 04/2017  . Nocturnal hypoxemia   . Obesity   . Open toe wound 11/2015   Wound care clinic appt made and then canceled when toe improved.  . Other and unspecified angina pectoris   . PAD (peripheral artery disease) ([place] 02/2016   Abnormal ABI's and waveforms: LE arterial duplex ordered for f/u as per cardiologist's recommendation.  Vasc eval by Dr. CBridgett Larsson impression was minimal PAD, recommended maximize med mgmt.  .Marland KitchenPAF (paroxysmal atrial fibrillation) ([place] 10/2014   xarelto  . Pericardial effusion with cardiac tamponade 6//29/15   pericardiocentesis was done,  Infectious/inflamm (cytology showed NO MALIGNANT CELLS)  . Pleural plaque    Pleural plaques/asbestosis changes on CT chest done by pulm 05/2016.  .Marland KitchenPneumonia   . Tobacco dependence in remission    100+ pack-yr hx: quit after CABG    Past Surgical History:  Procedure Laterality Date  . APPENDECTOMY  1957  . CARDIAC CATHETERIZATION  08/08/2013  . CATARACT EXTRACTION W/ INTRAOCULAR LENS   IMPLANT, BILATERAL  04/08/2006 & 04/22/2006  . CATARACT EXTRACTION W/ INTRAOCULAR LENS  IMPLANT, BILATERAL Bilateral   . CHOLECYSTECTOMY OPEN  1999  . COLONOSCOPY  10/16/2011   Procedure: COLONOSCOPY;  Surgeon: RInda Castle MD;  Location: [facility];  Service: Endoscopy;  Laterality: N/A;  . CORONARY ARTERY BYPASS GRAFT N/A 08/14/2013   Procedure: CORONARY ARTERY BYPASS GRAFTING (CABG);  Surgeon: SMelrose Nakayama MD;  [place]  Service: Open Heart Surgery;  Laterality: N/A;  Times 4   using left internal mammary artery and endoscopically harvested bilateral saphenous vein  . ESOPHAGOGASTRODUODENOSCOPY  02/25/12   Atrophic gastritis with a few erosions--capsule endoscopy planned as of 02/25/12 (Dr. KDeatra Ina.  .Marland KitchenHARDWARE REMOVAL Right 08/12/2012   Procedure: HARDWARE REMOVAL;  Surgeon: CMcarthur Rossetti MD;  Location: [facility];  Service: Orthopedics;  Laterality: Right;  . HIP SURGERY Right 1954   Repair of slipped capital femoral epiphysis.  . INTRAOPERATIVE TRANSESOPHAGEAL ECHOCARDIOGRAM N/A 08/14/2013   Normal LV function. Procedure: INTRAOPERATIVE TRANSESOPHAGEAL ECHOCARDIOGRAM;  Surgeon: Melrose Nakayama, MD;  Location: [facility];  Service: Open Heart Surgery;  Laterality: N/A;  . LEFT HEART CATHETERIZATION WITH CORONARY ANGIOGRAM N/A 08/08/2013   Procedure: LEFT HEART CATHETERIZATION WITH CORONARY ANGIOGRAM;  Surgeon: Jettie Booze, MD;  Location: [facility] CATH LAB;  Service: Cardiovascular;  Laterality: N/A;  . PATELLA FRACTURE SURGERY Left ~ 1979   bolt + 3 screws to repair tib plateau fx  . PERICARDIAL TAP N/A 07/01/2013   Procedure: PERICARDIAL TAP;  Surgeon: Jettie Booze, MD;  Location: [facility] CATH LAB;  Service: Cardiovascular;  Laterality: N/A;  . RIGHT/LEFT HEART CATH AND CORONARY ANGIOGRAPHY N/A 06/10/2016   Procedure: Right/Left Heart Cath and Coronary Angiography;  Surgeon: Larey Dresser, MD;  Location: [facility] CV LAB;  Service: Cardiovascular;  Laterality: N/A;   . TESTICLE SURGERY  as a child   Undescended testicle brought down into scrotum  . TONSILLECTOMY  1947  . TOTAL HIP ARTHROPLASTY Right 08/12/2012   Procedure: REMOVAL OF OLD PINS RIGHT HIP AND RIGHT TOTAL HIP ARTHROPLASTY ANTERIOR APPROACH;  Surgeon: Mcarthur Rossetti, MD;  Location: [facility];  Service: Orthopedics;  Laterality: Right;  . TRANSESOPHAGEAL ECHOCARDIOGRAM  09/22/2016   EF 40-45%; diffuse hypokinesis, Grd I DD.  [name] TRANSTHORACIC ECHOCARDIOGRAM  10/30/14   Mod LVH, EF 60-65%, normal wall motion, mod mitral regurg, mild PAH  . TRANSTHORACIC ECHOCARDIOGRAM  06/2016; 10/01/17   06/2016: EF 25-30%, global hypokinesis, grd II DD, vent septum motion changes c/w LBBB, mod MV regurg, L atr mod/sev dilat, R atr mod dilat, mod incr pulm press, Grd II DD.  09/2017: EF 50-55%, Hypokinesis of the basal inferolateral wall, grd I DD, mild MR, +LAE.     Home Medications:  Prior to Admission medications   Medication Sig Start Date End Date Taking? Authorizing Provider  acetaminophen (TYLENOL) 325 MG tablet Take 2 tablets (650 mg total) by mouth every 4 (four) hours as needed for headache or mild pain. 08/09/13  Yes Kilroy, Doreene Burke, PA-C  Ascorbic Acid (VITAMIN C) 1000 MG tablet Take 1,000 mg by mouth daily.   Yes [provider]  atorvastatin (LIPITOR) 40 MG tablet TAKE ONE TABLET BY MOUTH DAILY 12/07/17  Yes McGowen, Adrian Blackwater, MD  bisoprolol (ZEBETA) 5 MG tablet Take 1 tablet (5 mg total) by mouth daily. 11/26/17 11/26/18 Yes McGowen, Adrian Blackwater, MD  Cholecalciferol (VITAMIN D-3) 1000 units CAPS Take 1,000 Units by mouth daily. Reported on 02/26/2015   Yes [provider]  fluticasone (FLONASE) 50 MCG/ACT nasal spray Place 2 sprays into both nostrils daily. Patient taking differently: Place 2 sprays into both nostrils as needed.  06/04/16  Yes McGowen, Adrian Blackwater, MD  gabapentin (NEURONTIN) 100 MG capsule TAKE TWO CAPSULES BY MOUTH THREE TIMES DAILY Patient taking differently: Take 300 mg  by mouth 2 (two) times daily.  08/09/17  Yes McGowen, Adrian Blackwater, MD  guaiFENesin (MUCINEX) 600 MG 12 hr tablet Take 2 tablets (1,200 mg total) by mouth 2 (two) times daily. 11/02/14  Yes Verlee Monte, MD  insulin regular human CONCENTRATED (HUMULIN R) 500 UNIT/ML injection Inject 0.68m under the skin 3 times daily. Patient taking differently: Inject 42.5-50 Units into the skin 3 (three) times daily with meals. Pt is drawing syringe up to 0.146m(50 units) in the morning and  afternoon, and 0.085 ml (42.5 units) at bedtime. 12/15/17  Yes Elayne Snare, MD  isosorbide mononitrate (IMDUR) 30 MG 24 hr tablet TAKE ONE TABLET BY MOUTH EVERY DAY Patient taking differently: Take 30 mg by mouth daily.  06/09/17  Yes McGowen, Adrian Blackwater, MD  lipase/protease/amylase (CREON) 36000 UNITS CPEP capsule Take 2 capsules (72,000 Units total) by mouth 3 (three) times daily with meals. 11/10/17  Yes Danis, Kirke Corin, MD  mirtazapine (REMERON) 45 MG tablet Take 1 tablet (45 mg total) by mouth at bedtime. 10/19/17  Yes McGowen, Adrian Blackwater, MD  Multiple Vitamin (MULTIVITAMIN WITH MINERALS) TABS Take 1 tablet by mouth daily.   Yes [provider]  nitroGLYCERIN (NITROSTAT) 0.4 MG SL tablet PLACE 1 TABLET UNDER THE TONGUE EVERY 5 MINUTES AS NEEDED FOR CHEST PAIN Patient taking differently: Place 0.4 mg under the tongue every 5 (five) minutes as needed for chest pain.  03/30/17  Yes Larey Dresser, MD  PROAIR HFA [phone] Base) MCG/ACT inhaler INHALE 2 PUFFS INTO THE LUNGS EVERY 4 HOURS AS NEEDED FOR WHEEZING ORSHORTNESS OF BREATH Patient taking differently: Inhale 2 puffs into the lungs every 4 (four) hours as needed for wheezing or shortness of breath.  06/05/16  Yes McGowen, Adrian Blackwater, MD  sertraline (ZOLOFT) 25 MG tablet Take 2 tablets (50 mg total) by mouth at bedtime. 10/08/17  Yes McGowen, Adrian Blackwater, MD  sodium zirconium cyclosilicate (LOKELMA) 10 g PACK packet Take 10 g by mouth daily. 10/08/17  Yes Clegg, Amy D, NP  SYMBICORT  160-4.5 MCG/ACT inhaler INHALE 2 PUFFS INTO THE LUNGS TWICE DAILY Patient taking differently: Inhale 2 puffs into the lungs 2 (two) times daily.  12/01/16  Yes McGowen, Adrian Blackwater, MD  SYNJARDY 05-998 MG TABS TAKE ONE TABLET BY MOUTH TWICE DAILY AFTER A MEAL Patient taking differently: Take 1 tablet by mouth 2 (two) times daily after a meal.  11/20/17  Yes Elayne Snare, MD  tamsulosin (FLOMAX) 0.4 MG CAPS capsule TAKE ONE CAPSULE BY MOUTH DAILY AFTER SUPPER 10/29/17  Yes McGowen, Adrian Blackwater, MD  torsemide (DEMADEX) 20 MG tablet Take 20 mg every other day 10/28/17  Yes Clegg, Amy D, NP  VASCEPA 1 g CAPS TAKE TWO CAPSULES BY MOUTH TWICE DAILY 10/28/17  Yes Elayne Snare, MD  XARELTO 20 MG TABS tablet TAKE ONE TABLET BY MOUTH DAILY WITH SUPPER Patient taking differently: Take 20 mg by mouth daily with supper.  10/28/17  Yes Larey Dresser, MD  ACCU-CHEK GUIDE test strip USE TO CHECK BLOOD SUGAR 3 TIMES DAILY 07/27/16   McGowen, Adrian Blackwater, MD    Inpatient Medications: Scheduled Meds: . atorvastatin  40 mg Oral Daily  . bisoprolol  5 mg Oral Daily  . cholecalciferol  1,000 Units Oral Daily  . furosemide  40 mg Intravenous BID  . gabapentin  300 mg Oral BID  . guaiFENesin  1,200 mg Oral BID  . insulin aspart  0-20 Units Subcutaneous TID WC  . insulin aspart  0-5 Units Subcutaneous QHS  . insulin regular human CONCENTRATED  40 Units Subcutaneous Q supper  . insulin regular human CONCENTRATED  50 Units Subcutaneous BID WC  . ipratropium-albuterol  3 mL Nebulization BID  . isosorbide mononitrate  30 mg Oral Daily  . lipase/protease/amylase  72,000 Units Oral TID WC  . mouth rinse  15 mL Mouth Rinse BID  . mirtazapine  45 mg Oral QHS  . mometasone-formoterol  2 puff Inhalation BID  . multivitamin with  minerals  1 tablet Oral Daily  . omega-3 acid ethyl esters  2 g Oral BID  . predniSONE  20 mg Oral Q breakfast  . rivaroxaban  20 mg Oral Q supper  . sertraline  50 mg Oral QHS  . sodium chloride  HYPERTONIC  4 mL Nebulization BID  . tamsulosin  0.4 mg Oral QPC supper  . vitamin C  1,000 mg Oral Daily   Continuous Infusions:  PRN Meds: acetaminophen **OR** acetaminophen, fluticasone, ipratropium-albuterol, nitroGLYCERIN  Allergies:    Allergies  Allergen Reactions  . Morphine And Related Other (See Comments)    Drenched with perspiration  . Entresto [Sacubitril-Valsartan]     hypotension  . Demerol [Meperidine] Nausea Only  . Starlix [Nateglinide] Other (See Comments)    gassy    Social History:   Social History   Socioeconomic History  . Marital status: Married    Spouse name: [name]  . Number of children: 4  . Years of education: 10  . Highest education level: Not on file  Occupational History  . Occupation: retired, former Scientist, forensic  Social Needs  . Financial resource strain: Not hard at all  . Food insecurity:    Worry: Never true    Inability: Never true  . Transportation needs:    Medical: No    Non-medical: No  Tobacco Use  . Smoking status: Current Every Day Smoker    Packs/day: 1.00    Years: 64.00    Pack years: 64.00    Types: Cigarettes    Last attempt to quit: 10/23/2015    Years since quitting: 2.1  . Smokeless tobacco: Former Systems developer    Quit date: 06/30/2013  Substance and Sexual Activity  . Alcohol use: No    Alcohol/week: 0.0 standard drinks  . Drug use: No  . Sexual activity: Never  Lifestyle  . Physical activity:    Days per week: Not on file    Minutes per session: Not on file  . Stress: Not on file  Relationships  . Social connections:    Talks on phone: Not on file    Gets together: Not on file    Attends religious service: Not on file    Active member of club or organization: Not on file    Attends meetings of clubs or organizations: Not on file    Relationship status: Not on file  . Intimate partner violence:    Fear of current or ex partner: Not on file    Emotionally abused: Not on file    Physically abused: Not on  file    Forced sexual activity: Not on file  Other Topics Concern  . Not on file  Social History Narrative   Married, 4 children.   Formerly a Medical illustrator.   Level of education: HS.     +tobacco--lifelong/ quit 2015).  No alcohol or drugs.   No exercise.  +excessive caffeine.      Lives in 1 story home with his wife and her sister    Family History:    Family History  Problem Relation Age of Onset  . Heart disease Father   . Heart attack Father   . CVA Mother   . Hypertension Mother   . Diabetes Paternal Grandmother   . Breast cancer Sister   . Diabetes Maternal Uncle   . Heart disease Maternal Uncle   . Stroke Neg Hx      ROS:  Please see the history of present  illness.  General:no colds or fevers, no weight changes Skin:no rashes or ulcers HEENT:no blurred vision, no congestion CV:see HPI PUL:see HPI GI:no diarrhea constipation or melena, no indigestion GU:no hematuria, no dysuria MS:no joint pain, no claudication Neuro:no syncope, no lightheadedness Endo:+ diabetes, no thyroid disease  All other ROS reviewed and negative.     Physical Exam/Data:   Vitals:   12/24/17 2110 12/25/17 0409 12/25/17 0848 12/25/17 1429  BP:  (!) 148/69  (!) 131/57  Pulse: 62 74  66  Resp: 19 (!) 26  17  Temp:  98 F (36.7 C)  98 F (36.7 C)  TempSrc:  Oral  Oral  SpO2: 95% 95% 98% 95%  Weight:  104.2 kg    Height:        Intake/Output Summary (Last 24 hours) at 12/25/2017 1438 Last data filed at 12/25/2017 1300 Gross per 24 hour  Intake 780 ml  Output 1200 ml  Net -420 ml   Filed Weights   12/23/17 0530 12/24/17 0456 12/25/17 0409  Weight: 104.6 kg 103.2 kg 104.2 kg   Body mass index is 32.05 kg/m.  General:  Well nourished, well developed, in no acute distress HEENT: normal Lymph: no adenopathy Neck: no JVD at 30 degrees Endocrine:  No thryomegaly Vascular: No carotid bruits; pedal pulses 1+ bilaterally  Cardiac:  normal S1, S2; RRR; no  murmur, gallup or rub  Lungs:  Diminished lung sounds to auscultation bilaterally, no wheezing, rhonchi or rales  Abd: soft, nontender, no hepatomegaly  Ext: no edema Musculoskeletal:  No deformities, BUE and BLE strength normal and equal Skin: warm and dry  Neuro:  CNs 2-12 intact, no focal abnormalities noted Psych:  Normal affect    Relevant CV Studies: ECHO 10/01/17  Study Conclusions  - Left ventricle: The cavity size was normal. There was mild focal   basal hypertrophy of the septum. Systolic function was normal.   The estimated ejection fraction was in the range of 50% to 55%.   There is hypokinesis of the basalinferolateral myocardium.   Doppler parameters are consistent with abnormal left ventricular   relaxation (grade 1 diastolic dysfunction). Doppler parameters   are consistent with high ventricular filling pressure. - Mitral valve: Calcified annulus. There was mild regurgitation. - Left atrium: The atrium was mildly dilated.  Impressions:  - Hypokinesis of the basal inferolateral wall with overall low   normal LV systolic function; mild diastolic dysfunction; mild MR;   mild LAE.    Improved from 09/22/17 when EF 40-45%  G1DD  Rt and Lt cardiac cath 06/10/16 1. Mildly elevated left and right heart filling pressures.  2. Preserved cardiac output.  3. Severe 3 vessel native coronary disease.  The bypass grafts are all patent.  No interventional target.     Laboratory Data:  Chemistry Recent Labs  Lab 12/23/17 0318 12/24/17 0504 12/25/17 0400  NA 137 137 140  K 4.3 4.3 4.4  CL 101 100 103  CO2 _0 GLUCOSE 479* 338* 151*  BUN 31* 46* 46*  CREATININE 1.23 1.10 1.14  CALCIUM 8.9 8.7* 8.4*  GFRNONAA 56* >60 >60  GFRAA >60 >60 >60  ANIONGAP _1 No results for input(s): PROT, ALBUMIN, AST, ALT, ALKPHOS, BILITOT in the last 168 hours. Hematology Recent Labs  Lab 12/22/17 1256 12/24/17 0504 12/25/17 0400  WBC 11.1* 12.2* 11.9*  RBC  5.41 5.08 5.05  HGB 14.5 13.4 13.4  HCT 48.8 44.6 44.1  MCV 90.2  87.8 87.3  MCH 26.8 26.4 26.5  MCHC 29.7* 30.0 30.4  RDW 15.6* 15.1 14.9  PLT 213 179 180   Cardiac EnzymesNo results for input(s): TROPONINI in the last 168 hours.  Recent Labs  Lab 12/22/17 1304  TROPIPOC 0.03    BNP Recent Labs  Lab 12/22/17 1540  BNP 661.3*    DDimer No results for input(s): DDIMER in the last 168 hours.  Radiology/Studies:  Dg Chest 2 View  Result Date: 12/22/2017 CLINICAL DATA:  One day of shortness of breath. History of COPD, pneumonia, former smoker. Also atrial fibrillation. EXAM: CHEST - 2 VIEW COMPARISON:  Portable chest x-ray of October 01, 2017 FINDINGS: The lungs are well-expanded. The interstitial markings are mildly increased. The heart is top-normal in size. The pulmonary vascularity is prominent centrally with mild cephalization. There are small pleural effusions blunting the posterior costophrenic angles. There is calcification in the wall of the aortic arch. The patient has undergone previous CABG. The bony thorax exhibits no acute abnormality. There is dense calcification of the anterolateral longitudinal ligaments. IMPRESSION: COPD with superimposed mild CHF. There small bilateral pleural effusions layering posteriorly. No acute pneumonia. Thoracic aortic atherosclerosis. DISH. Electronically Signed   By: David  Martinique M.D.   On: 12/22/2017 13:29    Assessment and Plan:   1. Acute on chronic hypoxic respiratory failure with acute on chronic systolic and diastolic HF and COPD exacerbation.  He has improved but on lasix and steroids and nebs.  Neg respiratory viral panel -- Dr. Harl Bowie to see.  2. Acute on chronic HF systolic and diastolic with hx of CM  On diuretics lasix 40 mg BID IV and is neg 1444 and wt decreased by 7 lbs.  Lung sounds still diminished.  Would continue to diuresis  He has a follow up with Dr. [name] in [place]. They may need to see this admit.  Last discharge wt  was 216 lbs. He has been on torsemide at home and changed to every other day in October. 3. ICM though improved EF on last echo in Sept 2019.  Intolerant to [employer] and ACE and ARB cause hyperkalemia  In sept lisinopril, bisoprolol and dig and spironolactone stopped.   Bisoprolol was resumed  4. CAD with CABG in 2015 and last cath 2018 with patent grafts.--on xarelto so no ASA.  Continue statin and low dose BB.  No angina, neg troponin.  On imdur 5. PAF maintaining SR on xarelto for anticoagulation. 6. Hyperkalemia on Lokelma daily- not taking here and K+ with IV diuresing at 4.3 and 4.4 7. HLD on lipitor and vascepa 1G 2 caps BID at home.  8. Tobacco use at < 1ppd at home 9. DM-2 now with hyperglycemia with steroids, followed by IM      For questions or updates, please contact [place] Please consult [email or web address] for contact info under     Signed, Cecilie Kicks, NP  12/25/2017 2:38 PM'  Patient seen and discussed with PA Dorene Ar, I agree with her documentation above. 77 yo male history of chronic systolic HF, COPD, HTN, HL, DM2CAD, with prior CABG, PAF, asbestos exposure, LBBB admitted with SOB.      Todays labs WBC 11.9 Hgb 13.4 Plt 180 K 4.4 BUN 46 BNP 661 CXR COPD with  Mild CHF. SR 1st degree av block, LBBB 09/2017 echo LVEF 50-55%, grarde I diastolic dysfunction Admitted 12/22/17 with SOB.   Oct 2019 clinic weight 100.2 kg Weight today 104 kg  Admitted with acute on chronic  diastolic HF and COPD exacerbation. Negative 1.2 L since admission. He is on lasix 73m IV bid. Mild fluctuations in Cr with significant uptrend, BUN trending up. Remains volume overloaded by exam, weights remain above last clinic weight. Continue IV diuresis   JCarlyle DollyMD

## 2017-12-25 NOTE — Progress Notes (Signed)
SATURATION QUALIFICATIONS: (This note is used to comply with regulatory documentation for home oxygen)  Patient Saturations on Room Air at Rest = 90%  Patient Saturations on Room Air while Ambulating = 86%  Patient Saturations on 4 Liters of oxygen while Ambulating = 93%  Please briefly explain why patient needs home oxygen: The patient required 4L of oxygen to maintain O2 saturation greater than 92%.   Saddie Benders RN

## 2017-12-25 NOTE — Progress Notes (Signed)
The patient had an elevated CBG at lunch time. MD [place] Crane was notified and full treatment was given from pen and SS. Recheck 345. MD paged with new results. Awaiting treatment plan. MD order to watch by RN for diet and outside food intake at this time.   Saddie Benders RN

## 2017-12-25 NOTE — Progress Notes (Signed)
PROGRESS NOTE  Joshua [name] WPY:[id number] DOB: [birth year] DOA: 12/22/2017 PCP: Joshua Sou, MD  HPI/Recap of past 24 hours:  Joshua [name]  is a 77 y.o. male, with PMH of combined chronic systolic/diastolic CHF, COPD, history of asbestos exposure, HTN, HLD, and DM. Unable to tolerate Entresto due to lightheadedness/hypotension.  Add on ARB or Aldactone secondary to recurrent hyperkalemia, patient presents with complaints of shortness of breath,  7 to 10 days, with increased hypoxia with pulse ox checked at home. In ED patient with elevated proBNP, chest x-ray with vascular congestion, hypoxic requiring oxygen, TRH called to admit.  12/23/2017: Patient seen and examined at his bedside.  Reports persistent dyspnea with minimal movement.  Reports was previously on 2 L of oxygen by nasal cannula continuously at baseline and was taken off his O2 supplementation at home for about a month by his provider.  12/24/17: Seen and examined at bedside. Persistent dyspnea with minimal movement. Cough is improving.  12/25/2017: Patient seen and examined his daughter at bedside.  Still hypoxic worse on ambulation desaturation and mid 80s.  Requiring 4 L to maintain O2 saturation greater than 90%.  Hypervolemic on exam.  Cardiology consulted, recommends IV diuresis.  Assessment/Plan: Active Problems:   HTN (hypertension), benign   Hyperlipidemia   COPD (chronic obstructive pulmonary disease) (HCC)   CKD (chronic kidney disease) stage 3, GFR 30-59 ml/min (HCC)   Diabetes mellitus with complication (HCC)   COPD exacerbation (HCC)   Acute respiratory failure with hypoxia (HCC)  Acute on chronic hypoxic respiratory failure suspect multifactorial secondary to acute COPD exacerbation versus acute on chronic combined diastolic and systolic CHF Continue diuresing Continue strict I's and O's and daily weight Continue prednisone 20 mg daily and Lasix 40 mg IV twice daily Maintain O2 saturation greater than  92% c/w pulmonary toilet with Mucinex 1200 mg twice daily and hypersaline nebs 3 times daily C/w duo nebs every 6 hours Negative respiratory viral panel and influenza A and B PCR Failed home O2 evaluation requiring 4 L of oxygen on ambulation Cardiology consulted and following; recommends to continue IV diuresis  Steroids induced hyperglycemia Increase ISS to severe scale Hemoglobin A1c 6.8 on 12/24/2017 Weaning off steroids  Acute on chronic combined diastolic and systolic CHF Continue cardiac medications Strict I's and O's and daily weight Continue to monitor urine output  Paroxysmal A. fib Continue Xarelto for primary CVA prophylaxis Continue bisoprolol for rate control  History of coronary artery disease status post CABG Denies chest pain Continue cardiac medications including bisoprolol, aspirin and statin  Type 2 diabetes Hemoglobin A1c 6.8 on 12/24/2017 Continue insulin home regimen  Tobacco abuse Tobacco cessation counseling at bedside   Code Status: DNR  Family Communication: Spoke with daughter at bedside  Disposition Plan: Home in 1 to 2 days when O2 demand is close to his baseline   Consultants:  Cardiology  Procedures:  None  Antimicrobials:  None  DVT prophylaxis: Xarelto   Objective: Vitals:   12/24/17 2110 12/25/17 0409 12/25/17 0848 12/25/17 1429  BP:  (!) 148/69  (!) 131/57  Pulse: 62 74  66  Resp: 19 (!) 26  17  Temp:  98 F (36.7 C)  98 F (36.7 C)  TempSrc:  Oral  Oral  SpO2: 95% 95% 98% 95%  Weight:  104.2 kg    Height:        Intake/Output Summary (Last 24 hours) at 12/25/2017 1705 Last data filed at 12/25/2017 1300 Gross per 24 hour  Intake 540 ml  Output 1200 ml  Net -660 ml   Filed Weights   12/23/17 0530 12/24/17 0456 12/25/17 0409  Weight: 104.6 kg 103.2 kg 104.2 kg    Exam:  . General: 77 y.o. year-old male well-developed well-nourished appears uncomfortable due to dyspnea with movement.  Alert and  oriented x3. . Cardiovascular: Regular rate and rhythm with no rubs or gallops.  No JVD or thyromegaly noted.  [name] Respiratory: Mild rales at bases with no wheezes.  Poor inspiratory effort. . Abdomen: Soft nontender nondistended with normal bowel sounds x4 quadrants. . Musculoskeletal: Trace lower extremity edema. 2/4 pulses in all 4 extremities. [name] Psychiatry: Mood is appropriate for condition and setting   Data Reviewed: CBC: Recent Labs  Lab 12/22/17 1256 12/24/17 0504 12/25/17 0400  WBC 11.1* 12.2* 11.9*  NEUTROABS  --  10.8*  --   HGB 14.5 13.4 13.4  HCT 48.8 44.6 44.1  MCV 90.2 87.8 87.3  PLT 213 179 063   Basic Metabolic Panel: Recent Labs  Lab 12/22/17 1256 12/23/17 0318 12/24/17 0504 12/25/17 0400  NA 142 137 137 140  K 4.2 4.3 4.3 4.4  CL 104 101 100 103  CO2 _0 GLUCOSE 181* 479* 338* 151*  BUN 15 31* 46* 46*  CREATININE 0.87 1.23 1.10 1.14  CALCIUM 9.0 8.9 8.7* 8.4*   GFR: Estimated Creatinine Clearance: 65.6 mL/min (by C-G formula based on SCr of 1.14 mg/dL). Liver Function Tests: No results for input(s): AST, ALT, ALKPHOS, BILITOT, PROT, ALBUMIN in the last 168 hours. No results for input(s): LIPASE, AMYLASE in the last 168 hours. No results for input(s): AMMONIA in the last 168 hours. Coagulation Profile: No results for input(s): INR, PROTIME in the last 168 hours. Cardiac Enzymes: No results for input(s): CKTOTAL, CKMB, CKMBINDEX, TROPONINI in the last 168 hours. BNP (last 3 results) No results for input(s): PROBNP in the last 8760 hours. HbA1C: Recent Labs    12/24/17 0504  HGBA1C 6.8*   CBG: Recent Labs  Lab 12/24/17 2057 12/25/17 0822 12/25/17 1102 12/25/17 1236 12/25/17 1555  GLUCAP 193* 186* 504* 345* 329*   Lipid Profile: No results for input(s): CHOL, HDL, LDLCALC, TRIG, CHOLHDL, LDLDIRECT in the last 72 hours. Thyroid Function Tests: No results for input(s): TSH, T4TOTAL, FREET4, T3FREE, THYROIDAB in the last 72  hours. Anemia Panel: No results for input(s): VITAMINB12, FOLATE, FERRITIN, TIBC, IRON, RETICCTPCT in the last 72 hours. Urine analysis:    Component Value Date/Time   COLORURINE YELLOW 09/30/2017 [place] 09/30/2017 1235   LABSPEC 1.008 09/30/2017 1235   PHURINE 5.0 09/30/2017 1235   GLUCOSEU NEGATIVE 09/30/2017 1235   HGBUR NEGATIVE 09/30/2017 1235   BILIRUBINUR NEGATIVE 09/30/2017 1235   KETONESUR NEGATIVE 09/30/2017 1235   PROTEINUR NEGATIVE 09/30/2017 1235   UROBILINOGEN 0.2 08/11/2013 1624   NITRITE NEGATIVE 09/30/2017 1235   LEUKOCYTESUR NEGATIVE 09/30/2017 1235   Sepsis Labs: _1 (procalcitonin:4,lacticidven:4)  ) Recent Results (from the past 240 hour(s))  Respiratory Panel by PCR     Status: None   Collection Time: 12/23/17  6:02 PM  Result Value Ref Range Status   Adenovirus NOT DETECTED NOT DETECTED Final   Coronavirus 229E NOT DETECTED NOT DETECTED Final   Coronavirus HKU1 NOT DETECTED NOT DETECTED Final   Coronavirus NL63 NOT DETECTED NOT DETECTED Final   Coronavirus OC43 NOT DETECTED NOT DETECTED Final   Metapneumovirus NOT DETECTED NOT DETECTED Final   Rhinovirus / Enterovirus NOT DETECTED NOT  DETECTED Final   Influenza A NOT DETECTED NOT DETECTED Final   Influenza B NOT DETECTED NOT DETECTED Final   Parainfluenza Virus 1 NOT DETECTED NOT DETECTED Final   Parainfluenza Virus 2 NOT DETECTED NOT DETECTED Final   Parainfluenza Virus 3 NOT DETECTED NOT DETECTED Final   Parainfluenza Virus 4 NOT DETECTED NOT DETECTED Final   Respiratory Syncytial Virus NOT DETECTED NOT DETECTED Final   Bordetella pertussis NOT DETECTED NOT DETECTED Final   Chlamydophila pneumoniae NOT DETECTED NOT DETECTED Final   Mycoplasma pneumoniae NOT DETECTED NOT DETECTED Final    Comment: Performed at [facility], [place] [address], [place], [place] [address]      Studies: No results found.  Scheduled Meds: . atorvastatin  40 mg Oral Daily  . bisoprolol   5 mg Oral Daily  . cholecalciferol  1,000 Units Oral Daily  . furosemide  40 mg Intravenous BID  . gabapentin  300 mg Oral BID  . guaiFENesin  1,200 mg Oral BID  . insulin aspart  0-20 Units Subcutaneous TID WC  . insulin aspart  0-5 Units Subcutaneous QHS  . insulin regular human CONCENTRATED  40 Units Subcutaneous Q supper  . insulin regular human CONCENTRATED  50 Units Subcutaneous BID WC  . ipratropium-albuterol  3 mL Nebulization BID  . isosorbide mononitrate  30 mg Oral Daily  . lipase/protease/amylase  72,000 Units Oral TID WC  . mouth rinse  15 mL Mouth Rinse BID  . mirtazapine  45 mg Oral QHS  . mometasone-formoterol  2 puff Inhalation BID  . multivitamin with minerals  1 tablet Oral Daily  . omega-3 acid ethyl esters  2 g Oral BID  . predniSONE  20 mg Oral Q breakfast  . rivaroxaban  20 mg Oral Q supper  . sertraline  50 mg Oral QHS  . sodium chloride HYPERTONIC  4 mL Nebulization BID  . tamsulosin  0.4 mg Oral QPC supper  . vitamin C  1,000 mg Oral Daily    Continuous Infusions:   LOS: 3 days     Kayleen Memos, MD [facility] Pager [phone]  If 7PM-7AM, please contact night-coverage [email or web address] Password [facility] 12/25/2017, 5:05 PM

## 2017-12-26 DIAGNOSIS — I5032 Chronic diastolic (congestive) heart failure: Secondary | ICD-10-CM

## 2017-12-26 LAB — BASIC METABOLIC PANEL
Anion gap: 10 (ref 5–15)
BUN: 42 mg/dL — ABNORMAL HIGH (ref 8–23)
CO2: 29 mmol/L (ref 22–32)
Calcium: 8.1 mg/dL — ABNORMAL LOW (ref 8.9–10.3)
Chloride: 102 mmol/L (ref 98–111)
Creatinine, Ser: 0.96 mg/dL (ref 0.61–1.24)
GFR calc Af Amer: >60 mL/min (ref >60)
GFR calc non Af Amer: >60 mL/min (ref >60)
Glucose, Bld: 117 mg/dL — ABNORMAL HIGH (ref 70–99)
Potassium: 3.6 mmol/L (ref 3.5–5.1)
SODIUM: 141 mmol/L (ref 135–145)

## 2017-12-26 LAB — MAGNESIUM: MAGNESIUM: 2.4 mg/dL (ref 1.7–2.4)

## 2017-12-26 LAB — GLUCOSE, CAPILLARY
Glucose-Capillary: 73 mg/dL (ref 70–99)
Glucose-Capillary: 274 mg/dL — ABNORMAL HIGH (ref 70–99)
Glucose-Capillary: 226 mg/dL — ABNORMAL HIGH (ref 70–99)
Glucose-Capillary: 134 mg/dL — ABNORMAL HIGH (ref 70–99)

## 2017-12-26 LAB — PHOSPHORUS: Phosphorus: 2.9 mg/dL (ref 2.5–4.6)

## 2017-12-26 NOTE — Progress Notes (Signed)
PROGRESS NOTE  Joshua [name]:[id number] DOB: Jan 28, 1940 DOA: 12/22/2017 PCP: Tammi Sou, MD  HPI/Recap of past 24 hours:  Joshua [name]  is a 77 y.o. male, with PMH of combined chronic systolic/diastolic CHF, COPD, history of asbestos exposure, HTN, HLD, and DM. Unable to tolerate Entresto due to lightheadedness/hypotension.  Add on ARB or Aldactone secondary to recurrent hyperkalemia, patient presents with complaints of shortness of breath,  7 to 10 days, with increased hypoxia with pulse ox checked at home. In ED patient with elevated proBNP, chest x-ray with vascular congestion, hypoxic requiring oxygen, TRH called to admit.  12/23/2017: Patient seen and examined at his bedside.  Reports persistent dyspnea with minimal movement.  Reports was previously on 2 L of oxygen by nasal cannula continuously at baseline and was taken off his O2 supplementation at home for about a month by his provider.  12/24/17: Seen and examined at bedside. Persistent dyspnea with minimal movement. Cough is improving.  12/25/2017: Patient seen and examined his daughter at bedside.  Still hypoxic worse on ambulation desaturation and mid 80s.  Requiring 4 L to maintain O2 saturation greater than 90%.  Hypervolemic on exam.  Cardiology consulted, recommends IV diuresis.  12/26/17: No acute events overnight. Diuresing well. Breathing is improving. C/w IV lasix per cardiology.  Assessment/Plan: Active Problems:   HTN (hypertension), benign   Hyperlipidemia   COPD (chronic obstructive pulmonary disease) (HCC)   CKD (chronic kidney disease) stage 3, GFR 30-59 ml/min (HCC)   Diabetes mellitus with complication (HCC)   COPD exacerbation (HCC)   Acute respiratory failure with hypoxia (HCC)  Acute on chronic hypoxic respiratory failure suspect multifactorial secondary to acute COPD exacerbation versus acute on chronic combined diastolic and systolic CHF Continue diuresing Continue strict I's and O's and daily  weight Continue prednisone 20 mg daily and Lasix 40 mg IV twice daily Maintain O2 saturation greater than 92% c/w pulmonary toilet with Mucinex 1200 mg twice daily and hypersaline nebs 3 times daily C/w duo nebs every 6 hours Negative respiratory viral panel and influenza A and B PCR Failed home O2 evaluation requiring 4 L of oxygen on ambulation Cardiology consulted and following; recommends to continue IV diuresis Weight 104 kg from 107 kg C/w diuresis per cardiology repea home o2 eval tomorrow  Steroids induced hyperglycemia C/w ISS resistant scale Hemoglobin A1c 6.8 on 12/24/2017 Weaning off steroids  Acute on chronic combined diastolic and systolic CHF Continue cardiac medications C/w Strict I's and O's and daily weight Continue to monitor urine output  Paroxysmal A. fib Continue Xarelto for primary CVA prophylaxis Continue bisoprolol for rate control  History of coronary artery disease status post CABG Denies chest pain Continue cardiac medications including bisoprolol, aspirin and statin  Type 2 diabetes Hemoglobin A1c 6.8 on 12/24/2017 Continue insulin home regimen  Tobacco abuse Tobacco cessation counseling at bedside   Code Status: DNR  Family Communication: Spoke with daughter at bedside  Disposition Plan: Home in 1 to 2 days when O2 demand is close to his baseline or when cardiology signs off.   Consultants:  Cardiology  Procedures:  None  Antimicrobials:  None  DVT prophylaxis: Xarelto   Objective: Vitals:   12/26/17 0415 12/26/17 0506 12/26/17 0830 12/26/17 1415  BP:  127/63  (!) 127/48  Pulse:  60 73 66  Resp:   18 18  Temp:  (!) 97.4 F (36.3 C)  98.6 F (37 C)  TempSrc:  Oral  Oral  SpO2:  94% 96%  94%  Weight: 104 kg     Height:        Intake/Output Summary (Last 24 hours) at 12/26/2017 1632 Last data filed at 12/26/2017 1119 Gross per 24 hour  Intake 720 ml  Output 800 ml  Net -80 ml   Filed Weights   12/24/17 0456  12/25/17 0409 12/26/17 0415  Weight: 103.2 kg 104.2 kg 104 kg    Exam:  . General: 77 y.o. year-old male WD WN NAD A&O x 3. . Cardiovascular: RRR no rubs or gallops. No JVD or thyromegaly. [name] Respiratory:Diffused wheezes bilaterally. Good inspiratory efforts. . Abdomen: Soft nontender nondistended with normal bowel sounds x4 quadrants. . Musculoskeletal: Trace lower extremity edema. 2/4 pulses in all 4 extremities. [name] Psychiatry: Mood is appropriate for condition and setting   Data Reviewed: CBC: Recent Labs  Lab 12/22/17 1256 12/24/17 0504 12/25/17 0400  WBC 11.1* 12.2* 11.9*  NEUTROABS  --  10.8*  --   HGB 14.5 13.4 13.4  HCT 48.8 44.6 44.1  MCV 90.2 87.8 87.3  PLT 213 179 671   Basic Metabolic Panel: Recent Labs  Lab 12/22/17 1256 12/23/17 0318 12/24/17 0504 12/25/17 0400 12/26/17 0512  NA 142 137 137 140 141  K 4.2 4.3 4.3 4.4 3.6  CL 104 101 100 103 102  CO2 _0 GLUCOSE 181* 479* 338* 151* 117*  BUN 15 31* 46* 46* 42*  CREATININE 0.87 1.23 1.10 1.14 0.96  CALCIUM 9.0 8.9 8.7* 8.4* 8.1*  MG  --   --   --   --  2.4  PHOS  --   --   --   --  2.9   GFR: Estimated Creatinine Clearance: 77.9 mL/min (by C-G formula based on SCr of 0.96 mg/dL). Liver Function Tests: No results for input(s): AST, ALT, ALKPHOS, BILITOT, PROT, ALBUMIN in the last 168 hours. No results for input(s): LIPASE, AMYLASE in the last 168 hours. No results for input(s): AMMONIA in the last 168 hours. Coagulation Profile: No results for input(s): INR, PROTIME in the last 168 hours. Cardiac Enzymes: No results for input(s): CKTOTAL, CKMB, CKMBINDEX, TROPONINI in the last 168 hours. BNP (last 3 results) No results for input(s): PROBNP in the last 8760 hours. HbA1C: Recent Labs    12/24/17 0504  HGBA1C 6.8*   CBG: Recent Labs  Lab 12/25/17 1236 12/25/17 1555 12/25/17 2106 12/26/17 0750 12/26/17 1117  GLUCAP 345* 329* 321* 73 274*   Lipid Profile: No results for  input(s): CHOL, HDL, LDLCALC, TRIG, CHOLHDL, LDLDIRECT in the last 72 hours. Thyroid Function Tests: No results for input(s): TSH, T4TOTAL, FREET4, T3FREE, THYROIDAB in the last 72 hours. Anemia Panel: No results for input(s): VITAMINB12, FOLATE, FERRITIN, TIBC, IRON, RETICCTPCT in the last 72 hours. Urine analysis:    Component Value Date/Time   COLORURINE YELLOW 09/30/2017 [place] 09/30/2017 1235   LABSPEC 1.008 09/30/2017 1235   PHURINE 5.0 09/30/2017 1235   GLUCOSEU NEGATIVE 09/30/2017 1235   HGBUR NEGATIVE 09/30/2017 1235   BILIRUBINUR NEGATIVE 09/30/2017 1235   KETONESUR NEGATIVE 09/30/2017 1235   PROTEINUR NEGATIVE 09/30/2017 1235   UROBILINOGEN 0.2 08/11/2013 1624   NITRITE NEGATIVE 09/30/2017 1235   LEUKOCYTESUR NEGATIVE 09/30/2017 1235   Sepsis Labs: _1 (procalcitonin:4,lacticidven:4)  ) Recent Results (from the past 240 hour(s))  Respiratory Panel by PCR     Status: None   Collection Time: 12/23/17  6:02 PM  Result Value Ref Range Status   Adenovirus NOT DETECTED  NOT DETECTED Final   Coronavirus 229E NOT DETECTED NOT DETECTED Final   Coronavirus HKU1 NOT DETECTED NOT DETECTED Final   Coronavirus NL63 NOT DETECTED NOT DETECTED Final   Coronavirus OC43 NOT DETECTED NOT DETECTED Final   Metapneumovirus NOT DETECTED NOT DETECTED Final   Rhinovirus / Enterovirus NOT DETECTED NOT DETECTED Final   Influenza A NOT DETECTED NOT DETECTED Final   Influenza B NOT DETECTED NOT DETECTED Final   Parainfluenza Virus 1 NOT DETECTED NOT DETECTED Final   Parainfluenza Virus 2 NOT DETECTED NOT DETECTED Final   Parainfluenza Virus 3 NOT DETECTED NOT DETECTED Final   Parainfluenza Virus 4 NOT DETECTED NOT DETECTED Final   Respiratory Syncytial Virus NOT DETECTED NOT DETECTED Final   Bordetella pertussis NOT DETECTED NOT DETECTED Final   Chlamydophila pneumoniae NOT DETECTED NOT DETECTED Final   Mycoplasma pneumoniae NOT DETECTED NOT DETECTED Final     Comment: Performed at [facility], [place] [address], [place], [place] [address]      Studies: No results found.  Scheduled Meds: . atorvastatin  40 mg Oral Daily  . bisoprolol  5 mg Oral Daily  . cholecalciferol  1,000 Units Oral Daily  . furosemide  40 mg Intravenous BID  . gabapentin  300 mg Oral BID  . guaiFENesin  1,200 mg Oral BID  . insulin aspart  0-20 Units Subcutaneous TID WC  . insulin aspart  0-5 Units Subcutaneous QHS  . insulin regular human CONCENTRATED  40 Units Subcutaneous Q supper  . insulin regular human CONCENTRATED  50 Units Subcutaneous BID WC  . ipratropium-albuterol  3 mL Nebulization BID  . isosorbide mononitrate  30 mg Oral Daily  . lipase/protease/amylase  72,000 Units Oral TID WC  . mouth rinse  15 mL Mouth Rinse BID  . mirtazapine  45 mg Oral QHS  . mometasone-formoterol  2 puff Inhalation BID  . multivitamin with minerals  1 tablet Oral Daily  . omega-3 acid ethyl esters  2 g Oral BID  . predniSONE  20 mg Oral Q breakfast  . rivaroxaban  20 mg Oral Q supper  . sertraline  50 mg Oral QHS  . sodium chloride HYPERTONIC  4 mL Nebulization BID  . tamsulosin  0.4 mg Oral QPC supper  . vitamin C  1,000 mg Oral Daily    Continuous Infusions:   LOS: 4 days     Kayleen Memos, MD [facility] Pager [phone]  If 7PM-7AM, please contact night-coverage [email or web address] Password [facility] 12/26/2017, 4:32 PM

## 2017-12-26 NOTE — Progress Notes (Signed)
Progress Note  Patient Name: Joshua [name] Date of Encounter: 12/26/2017  Primary Cardiologist: Loralie Champagne, MD    Subjective   Breathing better laying flat in bed   Inpatient Medications    Scheduled Meds: . atorvastatin  40 mg Oral Daily  . bisoprolol  5 mg Oral Daily  . cholecalciferol  1,000 Units Oral Daily  . furosemide  40 mg Intravenous BID  . gabapentin  300 mg Oral BID  . guaiFENesin  1,200 mg Oral BID  . insulin aspart  0-20 Units Subcutaneous TID WC  . insulin aspart  0-5 Units Subcutaneous QHS  . insulin regular human CONCENTRATED  40 Units Subcutaneous Q supper  . insulin regular human CONCENTRATED  50 Units Subcutaneous BID WC  . ipratropium-albuterol  3 mL Nebulization BID  . isosorbide mononitrate  30 mg Oral Daily  . lipase/protease/amylase  72,000 Units Oral TID WC  . mouth rinse  15 mL Mouth Rinse BID  . mirtazapine  45 mg Oral QHS  . mometasone-formoterol  2 puff Inhalation BID  . multivitamin with minerals  1 tablet Oral Daily  . omega-3 acid ethyl esters  2 g Oral BID  . predniSONE  20 mg Oral Q breakfast  . rivaroxaban  20 mg Oral Q supper  . sertraline  50 mg Oral QHS  . sodium chloride HYPERTONIC  4 mL Nebulization BID  . tamsulosin  0.4 mg Oral QPC supper  . vitamin C  1,000 mg Oral Daily   Continuous Infusions:  PRN Meds: acetaminophen **OR** acetaminophen, fluticasone, ipratropium-albuterol, nitroGLYCERIN   Vital Signs    Vitals:   12/25/17 2143 12/26/17 0415 12/26/17 0506 12/26/17 0830  BP:   127/63   Pulse:   60 73  Resp:    18  Temp:   (!) 97.4 F (36.3 C)   TempSrc:   Oral   SpO2: 95%  94% 96%  Weight:  104 kg    Height:        Intake/Output Summary (Last 24 hours) at 12/26/2017 0906 Last data filed at 12/26/2017 0548 Gross per 24 hour  Intake 600 ml  Output 775 ml  Net -175 ml   Filed Weights   12/24/17 0456 12/25/17 0409 12/26/17 0415  Weight: 103.2 kg 104.2 kg 104 kg    Telemetry    NSR 12/26/2017  -  Personally Reviewed  ECG    SR rate 77 LBBB chronic  - Personally Reviewed  Physical Exam  Chronically ill COPDer  GEN: No acute distress.   Neck: No JVD Cardiac: RRR, no murmurs, rubs, or gallops.  Respiratory: Rhonchi and exp wheezes . GI: Soft, nontender, non-distended  MS: No edema; No deformity. Neuro:  Nonfocal  Psych: Normal affect  Plus one bilateral edema   Labs    Chemistry Recent Labs  Lab 12/24/17 0504 12/25/17 0400 12/26/17 0512  NA 137 140 141  K 4.3 4.4 3.6  CL 100 103 102  CO2 _0 GLUCOSE 338* 151* 117*  BUN 46* 46* 42*  CREATININE 1.10 1.14 0.96  CALCIUM 8.7* 8.4* 8.1*  GFRNONAA >60 >60 >60  GFRAA >60 >60 >60  ANIONGAP _1 Hematology Recent Labs  Lab 12/22/17 1256 12/24/17 0504 12/25/17 0400  WBC 11.1* 12.2* 11.9*  RBC 5.41 5.08 5.05  HGB 14.5 13.4 13.4  HCT 48.8 44.6 44.1  MCV 90.2 87.8 87.3  MCH 26.8 26.4 26.5  MCHC 29.7* 30.0 30.4  RDW 15.6*  15.1 14.9  PLT 213 179 180    Cardiac EnzymesNo results for input(s): TROPONINI in the last 168 hours.  Recent Labs  Lab 12/22/17 1304  TROPIPOC 0.03     BNP Recent Labs  Lab 12/22/17 1540  BNP 661.3*     DDimer No results for input(s): DDIMER in the last 168 hours.   Radiology    No results found.  Cardiac Studies   TTE 10/01/17 EF 50-55%   Patient Profile     77 y.o. male history of more diastolic CHF, COPD on home oxygen , history of CABG  Admitted with respiratory distress and CHF  Assessment & Plan    CHF:  Mild BNP in 600 range good response to diuretic improved Continue lasix 40 iv bid CAD/CABG: no chest pain ECG unrevealing LBBB Last cath 06/10/16 patent grafts stable  COPD:  Continue oxygen and inhalers CXR no infiltrates per primary team  PAF:  Maintaining NSR continue xarelto        For questions or updates, please contact [place] Please consult [email or web address] for contact info under        Signed, Jenkins Rouge, MD  12/26/2017, 9:06  AM

## 2017-12-27 ENCOUNTER — Ambulatory Visit: Payer: Self-pay | Admitting: Endocrinology

## 2017-12-27 DIAGNOSIS — I5043 Acute on chronic combined systolic (congestive) and diastolic (congestive) heart failure: Secondary | ICD-10-CM

## 2017-12-27 LAB — BASIC METABOLIC PANEL
Anion gap: 8 (ref 5–15)
BUN: 40 mg/dL — ABNORMAL HIGH (ref 8–23)
CO2: 31 mmol/L (ref 22–32)
Calcium: 8 mg/dL — ABNORMAL LOW (ref 8.9–10.3)
Chloride: 104 mmol/L (ref 98–111)
Creatinine, Ser: 0.9 mg/dL (ref 0.61–1.24)
GFR calc non Af Amer: >60 mL/min (ref >60)
Glucose, Bld: 63 mg/dL — ABNORMAL LOW (ref 70–99)
POTASSIUM: 3.6 mmol/L (ref 3.5–5.1)
Sodium: 143 mmol/L (ref 135–145)

## 2017-12-27 LAB — GLUCOSE, CAPILLARY
GLUCOSE-CAPILLARY: 193 mg/dL — AB (ref 70–99)
Glucose-Capillary: 82 mg/dL (ref 70–99)

## 2017-12-27 MED ORDER — TORSEMIDE 20 MG PO TABS
20.0000 mg | ORAL_TABLET | ORAL | Status: DC
  Administered 2017-12-27: 20 mg via ORAL
  Filled 2017-12-27: qty 1

## 2017-12-27 MED ORDER — PREDNISONE 20 MG PO TABS: 20.0000 mg | ORAL_TABLET | Freq: Every day | ORAL | 0 refills | Status: AC

## 2017-12-27 MED ORDER — ISOSORBIDE MONONITRATE ER 30 MG PO TB24: 30.0000 mg | ORAL_TABLET | Freq: Every day | ORAL | Status: DC

## 2017-12-27 NOTE — Progress Notes (Signed)
SATURATION QUALIFICATIONS: (This note is used to comply with regulatory documentation for home oxygen)  Patient Saturations on Room Air at Rest = 94%  Patient Saturations on Room Air while Ambulating = 84%  Patient Saturations on 2 Liters of oxygen while Ambulating = 94%  Please briefly explain why patient needs home oxygen: The patient required oxygen while ambulating to keep saturations greater than 88 %.   Saddie Benders RN

## 2017-12-27 NOTE — Discharge Summary (Addendum)
Discharge Summary  Joshua [name] RVU:[id number] DOB: 04-26-1940  PCP: Tammi Sou, MD  Admit date: 12/22/2017 Discharge date: 12/27/2017  Time spent: 35 minutes  Recommendations for Outpatient Follow-up:  1. Follow-up with cardiology 2. Follow-up with your PCP 3. Follow-up with endocrinology, Dr Dwyane Dee on 12/28/17 at 09:45 am  Discharge Diagnoses:  Active Hospital Problems   Diagnosis Date Noted  . Acute respiratory failure with hypoxia ([place]) 12/22/2017  . COPD exacerbation ([place]) 10/29/2014  . Diabetes mellitus with complication ([place]) 61/68/3729  . CKD (chronic kidney disease) stage 3, GFR 30-59 ml/min (HCC)   . COPD (chronic obstructive pulmonary disease) ([place])   . Hyperlipidemia 03/16/2012  . HTN (hypertension), benign 09/02/2011    Resolved Hospital Problems  No resolved problems to display.    Discharge Condition: Stable  Diet recommendation: Heart healthy diabetic diet  Vitals:   12/27/17 0926 12/27/17 0930  BP:    Pulse:    Resp:    Temp:    SpO2: 95% 97%    History of present illness:  [name] a77 y.o.male,with PMH significant for combined systolic/diastolicCHF, COPD, history of asbestos exposure, HTN, HLD, and type 2 DM. Unable to tolerate Entresto due to lightheadedness/hypotension. Add on ARB or Aldactone secondary to recurrent hyokalemia, patient presents with complaints of gradually worsening shortness of breath x 7 to 10 days, with increased hypoxia with pulse ox checked at home. In EDpatient with elevated proBNP, chest x-ray with vascular congestion, hypoxia requiring supplemental oxygen, TR[facility] called to admit.  Hospital course complicated by persistent dyspnea in the setting of fluid overload.  Cardiology consulted and followed.  Diuresed well.  Weight down from 107 kg to 103 kg.  Hypoxia improved post diuresis.  12/27/2017: Patient seen and examined with his wife at his bedside.  No acute events overnight.  States he is breathing  better.  Home O2 evaluation ordered and pending.  On the day of discharge, the patient was hemodynamically stable.  He will need to follow-up with his PCP, cardiology, endocrinology, posthospitalization.    Hospital Course:  Active Problems:   HTN (hypertension), benign   Hyperlipidemia   COPD (chronic obstructive pulmonary disease) (HCC)   CKD (chronic kidney disease) stage 3, GFR 30-59 ml/min (HCC)   Diabetes mellitus with complication (HCC)   COPD exacerbation (HCC)   Acute respiratory failure with hypoxia (HCC)  Acute on chronic hypoxic respiratory failure suspect multifactorial secondary to acute COPD exacerbation versus acute on chronic combined diastolic and systolic CHF Diuresed well Take torsemide 20 mg every other day  Weight yourself daily  Cardiology consulted and followed Continue to follow with cardiology outpatient Weight 103 kg from 107 kg on admission Home O2 evaluation ordered prior to discharge.  Pending.  Steroids induced hyperglycemia C/w ISS resistant scale Hemoglobin A1c 6.8 on 12/24/2017 Weaning off steroids  Prednisone 20 mg daily x5 days  Acute on chronic combined diastolic and systolic CHF Continue cardiac medications C/w Strict I's and O's and daily weight Continue to monitor urine output  Paroxysmal A. fib Continue Xarelto for primary CVA prophylaxis Continue bisoprolol for rate control  History of coronary artery disease status post CABG Denies chest pain Continue cardiac medications including bisoprolol, aspirin and statin  Type 2 diabetes Hemoglobin A1c 6.8 on 12/24/2017 Continue insulin home regimen  Tobacco abuse Tobacco cessation counseling at bedside   Code Status: DNR    Consultants:  Cardiology  Procedures:  None  Antimicrobials:  None  DVT prophylaxis: Xarelto  Discharge Exam: BP [name])  133/58 (BP Location: Right Arm)   Pulse 65   Temp 98 F (36.7 C) (Oral)   Resp 18   Ht _0  (1.803 m)    Wt 103.4 kg   SpO2 97%   BMI 31.79 kg/m  . General: 77 y.o. year-old male well developed well nourished in no acute distress.  Alert and oriented x3. . Cardiovascular: Regular rate and rhythm with no rubs or gallops.  No thyromegaly or JVD noted.   [name] Respiratory: Clear to auscultation with no wheezes or rales. Good inspiratory effort. . Abdomen: Soft nontender nondistended with normal bowel sounds x4 quadrants. . Musculoskeletal: No lower extremity edema. 2/4 pulses in all 4 extremities. . Skin: No ulcerative lesions noted or rashes, . Psychiatry: Mood is appropriate for condition and setting  Discharge Instructions You were cared for by a hospitalist during your hospital stay. If you have any questions about your discharge medications or the care you received while you were in the hospital after you are discharged, you can call the unit and asked to speak with the hospitalist on call if the hospitalist that took care of you is not available. Once you are discharged, your primary care physician will handle any further medical issues. Please note that NO REFILLS for any discharge medications will be authorized once you are discharged, as it is imperative that you return to your primary care physician (or establish a relationship with a primary care physician if you do not have one) for your aftercare needs so that they can reassess your need for medications and monitor your lab values.   Allergies as of 12/27/2017      Reactions   Morphine And Related Other (See Comments)   Drenched with perspiration   Entresto [sacubitril-valsartan]    hypotension   Demerol [meperidine] Nausea Only   Starlix [nateglinide] Other (See Comments)   gassy      Medication List    STOP taking these medications   guaiFENesin 600 MG 12 hr tablet Commonly known as:  MUCINEX   sodium zirconium cyclosilicate 10 g Pack packet Commonly known as:  LOKELMA   SYNJARDY 05-998 MG Tabs Generic drug:   Empagliflozin-metFORMIN HCl     TAKE these medications   ACCU-CHEK GUIDE test strip Generic drug:  glucose blood USE TO CHECK BLOOD SUGAR 3 TIMES DAILY   acetaminophen 325 MG tablet Commonly known as:  TYLENOL Take 2 tablets (650 mg total) by mouth every 4 (four) hours as needed for headache or mild pain.   atorvastatin 40 MG tablet Commonly known as:  LIPITOR TAKE ONE TABLET BY MOUTH DAILY   bisoprolol 5 MG tablet Commonly known as:  ZEBETA Take 1 tablet (5 mg total) by mouth daily.   fluticasone 50 MCG/ACT nasal spray Commonly known as:  FLONASE Place 2 sprays into both nostrils daily. What changed:    when to take this  reasons to take this   gabapentin 100 MG capsule Commonly known as:  [place] What changed:    how much to take  when to take this   insulin regular human CONCENTRATED 500 UNIT/ML injection Commonly known as:  HUMULIN R Inject 0.59m under the skin 3 times daily. What changed:    how much to take  how to take this  when to take this  additional instructions   isosorbide mononitrate 30 MG 24 hr tablet Commonly known as:  IMDUR TAKE ONE TABLET BY MOUTH EVERY DAY  lipase/protease/amylase 36000 UNITS Cpep capsule Commonly known as:  CREON Take 2 capsules (72,000 Units total) by mouth 3 (three) times daily with meals.   mirtazapine 45 MG tablet Commonly known as:  REMERON Take 1 tablet (45 mg total) by mouth at bedtime.   multivitamin with minerals Tabs tablet Take 1 tablet by mouth daily.   nitroGLYCERIN 0.4 MG SL tablet Commonly known as:  NITROSTAT PLACE 1 TABLET UNDER THE TONGUE EVERY 5 MINUTES AS NEEDED FOR CHEST PAIN What changed:  See the new instructions.   predniSONE 20 MG tablet Commonly known as:  DELTASONE Take 1 tablet (20 mg total) by mouth daily with breakfast for 5 days. Start taking on:  December 28, 2017   PROAIR HFA 108 (90 Base) MCG/ACT inhaler Generic drug:   albuterol INHALE 2 PUFFS INTO THE LUNGS EVERY 4 HOURS AS NEEDED FOR WHEEZING ORSHORTNESS OF BREATH What changed:  See the new instructions.   sertraline 25 MG tablet Commonly known as:  ZOLOFT Take 2 tablets (50 mg total) by mouth at bedtime.   SYMBICORT 160-4.5 MCG/ACT inhaler Generic drug:  budesonide-formoterol INHALE 2 PUFFS INTO THE LUNGS TWICE DAILY   tamsulosin 0.4 MG Caps capsule Commonly known as:  FLOMAX TAKE ONE CAPSULE BY MOUTH DAILY AFTER SUPPER   torsemide 20 MG tablet Commonly known as:  DEMADEX Take 20 mg every other day   VASCEPA 1 g Caps Generic drug:  Icosapent Ethyl TAKE TWO CAPSULES BY MOUTH TWICE DAILY   vitamin C 1000 MG tablet Take 1,000 mg by mouth daily.   Vitamin D-3 25 MCG (1000 UT) Caps Take 1,000 Units by mouth daily. Reported on 02/26/2015   XARELTO 20 MG Tabs tablet Generic drug:  rivaroxaban TAKE ONE TABLET BY MOUTH DAILY WITH SUPPER What changed:  See the new instructions.            Durable Medical Equipment  (From admission, onward)         Start     Ordered   12/27/17 1314  For home use only DME oxygen  Once    Question Answer Comment  Mode or (Route) Nasal cannula   Liters per Minute 2   Oxygen conserving device Yes   Oxygen delivery system Gas      12/27/17 1313   12/27/17 1314  For home use only DME Pulse oximeter  Once     12/27/17 1313         Allergies  Allergen Reactions  . Morphine And Related Other (See Comments)    Drenched with perspiration  . Entresto [Sacubitril-Valsartan]     hypotension  . Demerol [Meperidine] Nausea Only  . Starlix [Nateglinide] Other (See Comments)    gassy   Follow-up Information    [facility] Follow up.   Specialty:  Cardiology Why:  You have a hospital follow-up appointment scheduled for 01/13/2018 at 10:00am. Contact information: [address] [id number] [place]        Tammi Sou, MD. Call in 1 day(s).   Specialty:  Family Medicine Why:  Please call for a post hospital follow-up appointment. Contact information: [address] [place] [address] [phone]        Larey Dresser, MD .   Specialty:  Cardiology Contact information: [place] [address] [phone]        Elayne Snare, MD. Call in 1 day(s).   Specialty:  Endocrinology Why:  Please call for a post hospital follow-up appointment. Contact information: [place] [address] [phone]            The results of significant diagnostics from this hospitalization (including imaging, microbiology, ancillary and laboratory) are listed below for reference.    Significant Diagnostic Studies: Dg Chest 2 View  Result Date: 12/22/2017 CLINICAL DATA:  One day of shortness of breath. History of COPD, pneumonia, former smoker. Also atrial fibrillation. EXAM: CHEST - 2 VIEW COMPARISON:  Portable chest x-ray of October 01, 2017 FINDINGS: The lungs are well-expanded. The interstitial markings are mildly increased. The heart is top-normal in size. The pulmonary vascularity is prominent centrally with mild cephalization. There are small pleural effusions blunting the posterior costophrenic angles. There is calcification in the wall of the aortic arch. The patient has undergone previous CABG. The bony thorax exhibits no acute abnormality. There is dense calcification of the anterolateral longitudinal ligaments. IMPRESSION: COPD with superimposed mild CHF. There small bilateral pleural effusions layering posteriorly. No acute pneumonia. Thoracic aortic atherosclerosis. DISH. Electronically Signed   By: David  Martinique M.D.   On: 12/22/2017 13:29    Microbiology: Recent Results (from the past 240 hour(s))  Respiratory Panel by PCR     Status: None   Collection Time: 12/23/17  6:02 PM  Result Value Ref Range Status   Adenovirus NOT  DETECTED NOT DETECTED Final   Coronavirus 229E NOT DETECTED NOT DETECTED Final   Coronavirus HKU1 NOT DETECTED NOT DETECTED Final   Coronavirus NL63 NOT DETECTED NOT DETECTED Final   Coronavirus OC43 NOT DETECTED NOT DETECTED Final   Metapneumovirus NOT DETECTED NOT DETECTED Final   Rhinovirus / Enterovirus NOT DETECTED NOT DETECTED Final   Influenza A NOT DETECTED NOT DETECTED Final   Influenza B NOT DETECTED NOT DETECTED Final   Parainfluenza Virus 1 NOT DETECTED NOT DETECTED Final   Parainfluenza Virus 2 NOT DETECTED NOT DETECTED Final   Parainfluenza Virus 3 NOT DETECTED NOT DETECTED Final   Parainfluenza Virus 4 NOT DETECTED NOT DETECTED Final   Respiratory Syncytial Virus NOT DETECTED NOT DETECTED Final   Bordetella pertussis NOT DETECTED NOT DETECTED Final   Chlamydophila pneumoniae NOT DETECTED NOT DETECTED Final   Mycoplasma pneumoniae NOT DETECTED NOT DETECTED Final    Comment: Performed at [facility], [place] [address], [place], [place] [address]     Labs: Basic Metabolic Panel: Recent Labs  Lab 12/23/17 0318 12/24/17 0504 12/25/17 0400 12/26/17 0512 12/27/17 0451  NA 137 137 140 141 143  K 4.3 4.3 4.4 3.6 3.6  CL 101 100 103 102 104  CO2 _0 GLUCOSE 479* 338* 151* 117* 63*  BUN 31* 46* 46* 42* 40*  CREATININE 1.23 1.10 1.14 0.96 0.90  CALCIUM 8.9 8.7* 8.4* 8.1* 8.0*  MG  --   --   --  2.4  --   PHOS  --   --   --  2.9  --    Liver Function Tests: No results for input(s): AST, ALT, ALKPHOS, BILITOT, PROT, ALBUMIN in the last 168 hours. No results for input(s): LIPASE, AMYLASE in the last 168 hours. No results for input(s): AMMONIA in the last 168 hours. CBC: Recent Labs  Lab 12/22/17 1256 12/24/17 0504 12/25/17 0400  WBC 11.1* 12.2* 11.9*  NEUTROABS  --  10.8*  --   HGB 14.5 13.4 13.4  HCT 48.8 44.6 44.1  MCV 90.2 87.8  87.3  PLT 213 179 180   Cardiac Enzymes: No results for input(s): CKTOTAL, CKMB, CKMBINDEX, TROPONINI in the last  168 hours. BNP: BNP (last 3 results) Recent Labs    09/30/17 1237 12/22/17 1540  BNP 166.4* 661.3*    ProBNP (last 3 results) No results for input(s): PROBNP in the last 8760 hours.  CBG: Recent Labs  Lab 12/26/17 1117 12/26/17 1638 12/26/17 2126 12/27/17 0726 12/27/17 1133  GLUCAP 274* 226* 134* 82 193*       Signed:  Kayleen Memos, MD [facility] 12/27/2017, 1:29 PM

## 2017-12-27 NOTE — Care Management Note (Signed)
Case Management Note  Patient Details  Name: Joshua [name] MRN: [id number] Date of Birth: Dec 14, 1940  Subjective/Objective: Pt presented for Acute Respiratory Failure with Hypoixa PTA from home with spouse. Plan to return home today. Wife at the bedside. Choice had been offered for DME 02 and patient chose [facility]. Patient is refusing HH PT at this time. Patient is aware to call PCP if he needs [facility] PT once he transitions home.                     Action/Plan: CM did call [name] with [facility] to make him aware that patient will transition home with Oxygen. [facility] to deliver home oxygen to the room prior to transition home. No further needs from CM at this time.    Expected Discharge Date:  12/27/17               Expected Discharge Plan:  Home/Self Care  In-House Referral:  NA  Discharge planning Services  CM Consult  Post Acute Care Choice:  Home Health Choice offered to:  Patient, Spouse  DME Arranged:  Oxygen DME Agency:  [facility]  HH Arranged:  Patient Refused [place] Agency:  NA  Status of Service:  Completed, signed off  If discussed at [place] of Stay Meetings, dates discussed:    Additional Comments:  Bethena Roys, RN 12/27/2017, 1:35 PM

## 2017-12-27 NOTE — Progress Notes (Signed)
Inpatient Diabetes Program Recommendations  AACE/ADA: New Consensus Statement on Inpatient Glycemic Control (2019)  Target Ranges:  Prepandial:   less than 140 mg/dL      Peak postprandial:   less than 180 mg/dL (1-2 hours)      Critically ill patients:  140 - 180 mg/dL  Results for GILFORD, LARDIZABAL" (MRN [id number]) as of 12/27/2017 09:08  Ref. Range 12/26/2017 07:50 12/26/2017 11:17 12/26/2017 16:38 12/26/2017 21:26 12/27/2017 07:26  Glucose-Capillary Latest Ref Range: 70 - 99 mg/dL 73 274 (H) 226 (H) 134 (H) 82    Review of Glycemic Control  Current orders for Inpatient glycemic control: Humulin R U500 50 units with breakfast and lunch, Humulin R U500 40 units with supper, Novolog 0-20 units TID with meals, Novolog 0-5 units QHS; Prednisone 20 mg QAM  Inpatient Diabetes Program Recommendations:   Diet: Added Carb Mod to Heart Healthy diet which should help improve elevated post prandial glucose.  Insulin - Basal: If post prandial glucose continues to be consistently greater than 180 mg/dl and steroids are continued, may want to consider increasing morning dose of Humulin R U500 to 55 units QAM with breakfast and decreasing evening dose of Humulin R U500 to 35 units with supper (continue Humulin R U500 50 units with lunch).  Thanks, Barnie Alderman, RN, MSN, CDE Diabetes Coordinator Inpatient Diabetes Program [phone] (Team Pager from 8am to 5pm)

## 2017-12-27 NOTE — Progress Notes (Addendum)
Progress Note  Patient Name: Joshua [name] Date of Encounter: 12/27/2017  Primary Cardiologist: Loralie Champagne, MD   Subjective   No significant overnight events. Patient states that he is feeling better. His shortness of breath has improved. He is now able to lay almost completely flat without any difficulty breathing. No chest pain or palpitations. Patient is wondering when he can go home.  He has diuresed 1.9 liters so far Wt is down from 107 kg to 103.4 kg      Inpatient Medications    Scheduled Meds: . atorvastatin  40 mg Oral Daily  . bisoprolol  5 mg Oral Daily  . cholecalciferol  1,000 Units Oral Daily  . furosemide  40 mg Intravenous BID  . gabapentin  300 mg Oral BID  . guaiFENesin  1,200 mg Oral BID  . insulin aspart  0-20 Units Subcutaneous TID WC  . insulin aspart  0-5 Units Subcutaneous QHS  . insulin regular human CONCENTRATED  40 Units Subcutaneous Q supper  . insulin regular human CONCENTRATED  50 Units Subcutaneous BID WC  . ipratropium-albuterol  3 mL Nebulization BID  . isosorbide mononitrate  30 mg Oral Daily  . lipase/protease/amylase  72,000 Units Oral TID WC  . mouth rinse  15 mL Mouth Rinse BID  . mirtazapine  45 mg Oral QHS  . mometasone-formoterol  2 puff Inhalation BID  . multivitamin with minerals  1 tablet Oral Daily  . omega-3 acid ethyl esters  2 g Oral BID  . predniSONE  20 mg Oral Q breakfast  . rivaroxaban  20 mg Oral Q supper  . sertraline  50 mg Oral QHS  . sodium chloride HYPERTONIC  4 mL Nebulization BID  . tamsulosin  0.4 mg Oral QPC supper  . vitamin C  1,000 mg Oral Daily   Continuous Infusions:  PRN Meds: acetaminophen **OR** acetaminophen, fluticasone, ipratropium-albuterol, nitroGLYCERIN   Vital Signs    Vitals:   12/26/17 1415 12/26/17 2015 12/26/17 2126 12/27/17 0635  BP: (!) 127/48  (!) 104/58 (!) 133/58  Pulse: 66  65 65  Resp: 18     Temp: 98.6 F (37 C)  97.7 F (36.5 C) 98 F (36.7 C)  TempSrc: Oral   Oral Oral  SpO2: 94% 96% 93% 94%  Weight:    103.4 kg  Height:        Intake/Output Summary (Last 24 hours) at 12/27/2017 0729 Last data filed at 12/26/2017 2130 Gross per 24 hour  Intake 360 ml  Output 1125 ml  Net -765 ml   Filed Weights   12/25/17 0409 12/26/17 0415 12/27/17 0635  Weight: 104.2 kg 104 kg 103.4 kg    Telemetry    Sinus rhythm with PACs and PVCs. - Personally Reviewed  ECG    No new ECG tracings today. - Personally Reviewed  Physical Exam   GEN: Caucasian male resting comfortably. Alert and in no acute distress. Currently on supplemental O2 via nasal cannula. Neck: Supple. No JVD. Cardiac: RRR. No murmurs, rubs, or gallops.  Respiratory: No increased work of breathing. Minimal crackles noted in bilateral bases. GI: Abdomen soft, non-tender, non-distended. Bowel sounds present. MS: No significant lower extremity edema. Neuro:  No focal deficits. Psych: Normal affect.  Labs    Chemistry Recent Labs  Lab 12/25/17 0400 12/26/17 0512 12/27/17 0451  NA 140 141 143  K 4.4 3.6 3.6  CL 103 102 104  CO2 _0 GLUCOSE 151* 117* 63*  BUN  46* 42* 40*  CREATININE 1.14 0.96 0.90  CALCIUM 8.4* 8.1* 8.0*  GFRNONAA >60 >60 >60  GFRAA >60 >60 >60  ANIONGAP _0 Hematology Recent Labs  Lab 12/22/17 1256 12/24/17 0504 12/25/17 0400  WBC 11.1* 12.2* 11.9*  RBC 5.41 5.08 5.05  HGB 14.5 13.4 13.4  HCT 48.8 44.6 44.1  MCV 90.2 87.8 87.3  MCH 26.8 26.4 26.5  MCHC 29.7* 30.0 30.4  RDW 15.6* 15.1 14.9  PLT 213 179 180    Cardiac EnzymesNo results for input(s): TROPONINI in the last 168 hours.  Recent Labs  Lab 12/22/17 1304  TROPIPOC 0.03     BNP Recent Labs  Lab 12/22/17 1540  BNP 661.3*     DDimer No results for input(s): DDIMER in the last 168 hours.   Radiology    No results found.  Cardiac Studies   Echocardiogram 10/01/2017: Study Conclusions: - Left ventricle: The cavity size was normal. There was mild focal    basal hypertrophy of the septum. Systolic function was normal.   The estimated ejection fraction was in the range of 50% to 55%.   There is hypokinesis of the basalinferolateral myocardium.   Doppler parameters are consistent with abnormal left ventricular   relaxation (grade 1 diastolic dysfunction). Doppler parameters   are consistent with high ventricular filling pressure. - Mitral valve: Calcified annulus. There was mild regurgitation. - Left atrium: The atrium was mildly dilated.  Impressions: - Hypokinesis of the basal inferolateral wall with overall low   normal LV systolic function; mild diastolic dysfunction; mild MR;   mild LAE. _______________  Right/Left Cardiac Catheterization 06/10/2016: 1. Mildly elevated left and right heart filling pressures.  2. Preserved cardiac output.  3. Severe 3 vessel native coronary disease. The bypass grafts are all patent. No interventional target.   Patient Profile     Mr. Osment is a 77 year old male with a history of combined systolic and diastolic CHF/ischemic cardiomyopathy, CAD s/p CABG in 2015 with last cardiac catheterization showing patent bypass grafts in 06/2016, paroxymal atrial fibrillation on Xarelto, hypertension, hyperlipidemia, type 2 diabetes mellitus, and COPD who is being seen today for evaluation of CHF at the request of Dr. Nevada Crane.   Assessment & Plan    Acute on Chronic Combined CHF - BNP 661.3 on admission. - Documented output of 1.1 L in the past 24 hours with net negative 1.9 L since admission.  - Weight down about 8 lbs since admission. Patient 227.9 lbs today. Patient thinks his dry weight is around 230 lbs; however, his discharge weight in 09/2017 was 98.4 kg (about 217 lbs). - Patient does not appear significantly volume overloaded on exam. Shortness of breath has improved and patient is now able to lay almost completely flat. - Continue diuresis with IV Lasix this morning. Consider switching to PO later today or  tomorrow. Patient is on Torsemide 65m every other day at home. - Continue beta-blocker. - Patient intolerant to Entresto/ACEi/ARB due to hyperkalemia.  - Continue monitoring daily weights, strict I/O's, and renal function.  CAD s/p CABG - Last cardiac catheterization showed patent bypass grafts. - Patient is on Xarelto so no Aspirin. - Continue secondary prevention.  Paroxysmal Atrial Fibrillation - Patient is currently in sinus rhythm on telemetry. Looks like patient may have had brief episode of atrial fibrillation with heart rate in the 80's last night but returned to sinus rhythm. - Continue Bisoprolol 518mdaily. - Continue chronic anticoagulation with Xarelto.  Hyperkalemia - Patient on Lokelma daily at home but is not taking here. - Potassium 3.6 today. - Continue to monitor closely.  Hypertension - BP is currently well controlled. Most recent BP 133/58. - Continue Bisoprolol as above.  Hyperlipidemia - Continue Lipitor 29m daily. - Patient also on Vascepa 2g twice daily at home.  COPD  - Per primary team.  For questions or updates, please contact [facility] Please consult [email or web address] for contact info under        Signed, CDarreld Mclean PA-C  12/27/2017, 7:29 AM    Attending Note:   The patient was seen and examined.  Agree with assessment and plan as noted above.  Changes made to the above note as needed.  Patient seen and independently examined with CSande Rives PA .   We discussed all aspects of the encounter. I agree with the assessment and plan as stated above.  1.   Acute on chronic combined systolic and diastolic congestive heart failure: The patient is diuresed approximately 1.9 L since admission.  His weight is down 8 pounds.  He is feeling back to his normal state.  He is able to lie flat without any difficulties.  He walked up and down the hallways yesterday without difficulty.  Is to be stable for discharge.  We will send him home  on his same home medications. Added torsemide 20 mg QOD and Imdur 30 mg a day   We will give him the okay to take an extra torsemide as needed if he develops worsening shortness of breath.  Will be seen by Dr. MAundra Dubinor APP  in follow-up in the next week or so.   I have spent a total of 40 minutes with patient reviewing hospital  notes , telemetry, EKGs, labs and examining patient as well as establishing an assessment and plan that was discussed with the patient. > 50% of time was spent in direct patient care.    PThayer Headings JBrooke Bonito, MD, FTexas Health Sure[facility] [place]12/23/2019, 11:59 AM [address]  SHillsboroPager [phone]

## 2017-12-28 ENCOUNTER — Encounter: Payer: Self-pay | Admitting: Endocrinology

## 2017-12-28 ENCOUNTER — Telehealth: Payer: Self-pay | Admitting: Family Medicine

## 2017-12-28 ENCOUNTER — Ambulatory Visit (INDEPENDENT_AMBULATORY_CARE_PROVIDER_SITE_OTHER): Payer: Medicare Other | Admitting: Endocrinology

## 2017-12-28 VITALS — BP 140/68 | HR 76 | Ht 71.0 in | Wt 227.0 lb

## 2017-12-28 DIAGNOSIS — E1165 Type 2 diabetes mellitus with hyperglycemia: Secondary | ICD-10-CM | POA: Diagnosis not present

## 2017-12-28 DIAGNOSIS — Z794 Long term (current) use of insulin: Secondary | ICD-10-CM | POA: Diagnosis not present

## 2017-12-28 NOTE — Telephone Encounter (Signed)
Copied from [place] [phone]. Topic: General - Other >> Dec 28, 2017  9:04 AM Lennox Solders wrote: Reason for CRM: pt wife is calling and her husband needs order for portable oxygen battery  type from [place] [place]. Pt wife does not have the number for [employer].  Pt has a post hospital follow up with dr Anitra Lauth on 01-06-18

## 2017-12-28 NOTE — Patient Instructions (Addendum)
Check sugar 4 x daily  On prednisone 15 before Bkfst anf 12 before lunch and 4 units before supper  Insulin 30 MIN BEFORE ALL MEALS  Back on [place]

## 2017-12-28 NOTE — Telephone Encounter (Signed)
LMOM for pt to CB to discuss post hospital care ( TCM ).

## 2017-12-28 NOTE — Progress Notes (Signed)
Patient ID: Joshua [name], male   DOB: 13-Dec-1940, 77 y.o.   MRN: [id number]            Reason for Appointment:  Follow-up for Type 2 Diabetes  Referring physician: [place]   History of Present Illness:          Date of diagnosis of type 2 diabetes mellitus: ?  2002        Background history:   He apparently had been treated with metformin initially which was continued until about 2016. He previously has also been tried on Amaryl, Actos, Farxiga and Victoza but he claims that these did not help his blood sugar and were stopped Appears to have been on insulin since about 2014 but he does not remember details. He has been taking mostly Levemir insulin as a basal insulin but was also on Lantus in 2015 for some time His best A1c has been 7.3, once in 2014 and another time in 2016, otherwise his A1c has been higher and as high as 10.8  Recent history:   INSULIN regimen U-500 insulin 3 times a day with syringe, doses 10--10--8.5  Non-insulin hypoglycemic drugs the patient is taking are: Synjardy 05/998 twice a day breakfast and suppertime  His A1c is recently 6.8 compared to 9% previously   Current management, blood sugar patterns and problems identified:  He did bring his monitor for download  Although he had been taking  the equivalent of 100 units of insulin with his V-go pump previously he started having hypoglycemia in October and his pump was stopped  He is now taking Humulin R with his range  Although he is supposed to take the insulin before eating he is taking it 30 minutes after eating in the evening  He still checking the sugar more than 3 times a day as recommended  Overall blood sugars are relatively low in the mornings with some overnight hypoglycemia  However blood sugars tend to go up in the midday and afternoon and averaging well over 200 at times  He appears to be having a relatively large breakfast in the morning and taking about the same insulin dose daily  without any adjustment  He has a relatively light supper in the evening but not checking enough readings after eating  Most of his low sugars during the night are about 3 AM  Since his hospitalization on 12/22/2017 he has been on steroids and now is supposed to be taking 20 mg prednisone daily for 5 days  With this his blood sugars have been over 200 during the day and still relatively low overnight  Blood sugar was 103 this morning fasting at home    Side effects from medications have been: None  Compliance with the medical regimen: Fair   Glucose monitoring:  done about 3 times a day         Glucometer:  Accu-Chek .      Blood Glucose readings by download:   PRE-MEAL Fasting Lunch Dinner  overnight Overall  Glucose range:  52-185    48-157   Mean/median:  90   187  94    POST-MEAL PC Breakfast PC Lunch PC Dinner  Glucose range:    104-326  Mean/median:  152  212    Previous readings  PRE-MEAL Fasting Lunch Dinner Bedtime Overall  Glucose range:  61-218      Mean/median:  125     142   POST-MEAL PC Breakfast PC Lunch PC Dinner  Glucose  range:   208, 243  163-248  Mean/median:    199      Self-care: The diet that the patient has been following is: tries to limit Sweet drinks .     Meal times are:  Breakfast is at 7 a.m.  Dinner: 5-6 PM    Typical meal intake: Breakfast is usually eggs,  Sausage, oatmeal or grits.  Lunch is a sandwich, any evening he has meat and vegetables, For snacks he will have fruit or crackers At       [facility] visit, most recent: 01/09/16 CDE visit: 12/18               Exercise: Unable to do any     Weight history:  Wt Readings from Last 3 Encounters:  12/28/17 227 lb (103 kg)  12/27/17 227 lb 14.4 oz (103.4 kg)  12/16/17 236 lb (107 kg)    Glycemic control:   Lab Results  Component Value Date   HGBA1C 6.8 (H) 12/24/2017   HGBA1C 9.0 (H) 10/01/2017   HGBA1C 8.3 (A) 09/29/2017   Lab Results  Component Value Date   MICROALBUR  1.4 07/02/2017   LDLCALC 42 12/16/2017   CREATININE 0.90 12/27/2017   Lab Results  Component Value Date   MICRALBCREAT 12.2 07/02/2017    Other active problems: See review of systems   Allergies as of 12/28/2017      Reactions   Morphine And Related Other (See Comments)   Drenched with perspiration   Entresto [sacubitril-valsartan]    hypotension   Demerol [meperidine] Nausea Only   Starlix [nateglinide] Other (See Comments)   gassy      Medication List       Accurate as of December 28, 2017  4:09 PM. Always use your most recent med list.        ACCU-CHEK GUIDE test strip Generic drug:  glucose blood USE TO CHECK BLOOD SUGAR 3 TIMES DAILY   acetaminophen 325 MG tablet Commonly known as:  TYLENOL Take 2 tablets (650 mg total) by mouth every 4 (four) hours as needed for headache or mild pain.   atorvastatin 40 MG tablet Commonly known as:  LIPITOR TAKE ONE TABLET BY MOUTH DAILY   bisoprolol 5 MG tablet Commonly known as:  ZEBETA Take 1 tablet (5 mg total) by mouth daily.   fluticasone 50 MCG/ACT nasal spray Commonly known as:  FLONASE Place 2 sprays into both nostrils daily.   gabapentin 100 MG capsule Commonly known as:  NEURONTIN TAKE TWO CAPSULES BY MOUTH THREE TIMES DAILY   insulin regular human CONCENTRATED 500 UNIT/ML injection Commonly known as:  HUMULIN R Inject 0.94m under the skin 3 times daily.   isosorbide mononitrate 30 MG 24 hr tablet Commonly known as:  IMDUR Take 30 mg by mouth daily. TAKE 1 TABLET BY MOUTH ONCE DAILY.   lipase/protease/amylase 36000 UNITS Cpep capsule Commonly known as:  CREON Take 2 capsules (72,000 Units total) by mouth 3 (three) times daily with meals.   mirtazapine 45 MG tablet Commonly known as:  REMERON Take 1 tablet (45 mg total) by mouth at bedtime.   multivitamin with minerals Tabs tablet Take 1 tablet by mouth daily.   nitroGLYCERIN 0.4 MG SL tablet Commonly known as:  NITROSTAT PLACE 1 TABLET UNDER  THE TONGUE EVERY 5 MINUTES AS NEEDED FOR CHEST PAIN   predniSONE 20 MG tablet Commonly known as:  DELTASONE Take 1 tablet (20 mg total) by mouth daily with breakfast for 5 days.  PROAIR HFA 108 (90 Base) MCG/ACT inhaler Generic drug:  albuterol INHALE 2 PUFFS INTO THE LUNGS EVERY 4 HOURS AS NEEDED FOR WHEEZING ORSHORTNESS OF BREATH   sertraline 25 MG tablet Commonly known as:  ZOLOFT Take 2 tablets (50 mg total) by mouth at bedtime.   SYMBICORT 160-4.5 MCG/ACT inhaler Generic drug:  budesonide-formoterol INHALE 2 PUFFS INTO THE LUNGS TWICE DAILY   tamsulosin 0.4 MG Caps capsule Commonly known as:  FLOMAX TAKE ONE CAPSULE BY MOUTH DAILY AFTER SUPPER   torsemide 20 MG tablet Commonly known as:  DEMADEX Take 20 mg every other day   VASCEPA 1 g Caps Generic drug:  Icosapent Ethyl TAKE TWO CAPSULES BY MOUTH TWICE DAILY   vitamin C 1000 MG tablet Take 1,000 mg by mouth daily.   Vitamin D-3 25 MCG (1000 UT) Caps Take 1,000 Units by mouth daily. Reported on 02/26/2015   XARELTO 20 MG Tabs tablet Generic drug:  rivaroxaban TAKE ONE TABLET BY MOUTH DAILY WITH SUPPER       Allergies:  Allergies  Allergen Reactions  . Morphine And Related Other (See Comments)    Drenched with perspiration  . Entresto [Sacubitril-Valsartan]     hypotension  . Demerol [Meperidine] Nausea Only  . Starlix [Nateglinide] Other (See Comments)    gassy    Past Medical History:  Diagnosis Date  . Acute respiratory distress 06/04/2016  . Adenomatous colon polyp 10/16/2011   Repeat 2018  . Arthritis     Hips, R>L & KNEES  . CAD, multiple vessel    3V CAD cath 08/08/13----CABG done shortly after.  Cath 06/2016: clear bipasses/no interventional lesion.  . 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 pylori neg, no dysplasia or malignancy.  . Chronic combined systolic and diastolic CHF, NYHA class 2 ([place]) 2016-2018   Followed by [facility].  Transesoph  Echo 09/2016 EF improved to 40-45%; diffuse hypokinesis, Grd I DD.  09/2017 EF 50-55%, grd I DD.  [name] Chronic hypoxemic respiratory failure (HCC)    HISTORY OF(2018): oxygen testing normal in office 11/10/17, no home oxygen was not recertified.  . Chronic renal insufficiency, stage 3 (moderate) ([place]) 2018   GFR 50s  . COPD (chronic obstructive pulmonary disease) ([place])    GOLD II.  Spirometry  2004 borderline obstruction; 2015 mod obst: noncompliant with bid symbicort so pulmonologist switched him to a once daily inhaler: Trelegy ellipta 12/2015.  Oxygen prn: goal 88-92%.  . Diabetic nephropathy ([place])    Elevated urine microalb/cr 03/2011  . DM type 2 (diabetes mellitus, type 2) ([place])    Poor control on max oral meds--pt eventually agreed to insulin therapy.  As of 2017 his DM is being managed by Dr. Dwyane Dee in endo.  On insulin pump as of 04/2017 endo f/u.  [name] DOE (dyspnea on exertion)    COPD + chronic diastolic HF  . Dysthymia    Worsened after 2015 heart surgery.  Memory loss +? as well.  Neuro eval 01/2017--plan is to get MRI brain, B12 level, and neuropsychiatric testing.  . Erectile dysfunction    Normal testosterone  . Hearing loss of both ears 2016   Hearing aids  . Hyperplastic colon polyp 2001   No further colonoscopies to be done as of 11/2016 GI eval.  . Hypertension   . IBS (irritable bowel syndrome)    responds relatively well to prn levsin.  Pepto bismol q AM trial by Dr. Loletha Carrow 08/2017, also checking fecal elastace and  lactoferrin.  . Iron deficiency anemia 2014   03/2012 capsule endoscopy showed 2 AVMs--likely responsible for his IDA--lifetime iron supp recommended + q31moCBCs.  . Ischemic cardiomyopathy 06/2016; 09/2016   06/2016: EF 25-30%, global hypokinesis, grd II DD, vent septum motion changes c/w LBBB, mod MV regurg, L atr mod/sev dilat, R atr mod dilat, mod incr pulm press, Grd II DD.  09/2016--EF 40-45%; diffuse hypokinesis, Grd I DD.  . Macular degeneration, dry    Mild, bilat  (Optometrist, DMayford Knifeat [place] [employer] [place]  . Mild cognitive impairment    MCI, likley vascular etiology, + MDD, recurrent-->neuropsych testing 06/2017.  Neurol to repeat testing 1 yr  . Mixed hyperlipidemia    statin and vascepa as of 04/2017  . Nocturnal hypoxemia   . Obesity   . Open toe wound 11/2015   Wound care clinic appt made and then canceled when toe improved.  . Other and unspecified angina pectoris   . PAD (peripheral artery disease) ([place] 02/2016   Abnormal ABI's and waveforms: LE arterial duplex ordered for f/u as per cardiologist's recommendation.  Vasc eval by Dr. CBridgett Larsson impression was minimal PAD, recommended maximize med mgmt.  .Marland KitchenPAF (paroxysmal atrial fibrillation) ([place] 10/2014   xarelto  . Pericardial effusion with cardiac tamponade 6//29/15   pericardiocentesis was done,  Infectious/inflamm (cytology showed NO MALIGNANT CELLS)  . Pleural plaque    Pleural plaques/asbestosis changes on CT chest done by pulm 05/2016.  .Marland KitchenPneumonia   . Tobacco dependence in remission    100+ pack-yr hx: quit after CABG    Past Surgical History:  Procedure Laterality Date  . APPENDECTOMY  1957  . CARDIAC CATHETERIZATION  08/08/2013  . CATARACT EXTRACTION W/ INTRAOCULAR LENS  IMPLANT, BILATERAL  04/08/2006 & 04/22/2006  . CATARACT EXTRACTION W/ INTRAOCULAR LENS  IMPLANT, BILATERAL Bilateral   . CHOLECYSTECTOMY OPEN  1999  . COLONOSCOPY  10/16/2011   Procedure: COLONOSCOPY;  Surgeon: RInda Castle MD;  Location: [facility];  Service: Endoscopy;  Laterality: N/A;  . CORONARY ARTERY BYPASS GRAFT N/A 08/14/2013   Procedure: CORONARY ARTERY BYPASS GRAFTING (CABG);  Surgeon: SMelrose Nakayama MD;  [facility]  Service: Open Heart Surgery;  Laterality: N/A;  Times 4   using left internal mammary artery and endoscopically harvested bilateral saphenous vein  . ESOPHAGOGASTRODUODENOSCOPY  02/25/12   Atrophic gastritis with a few erosions--capsule endoscopy  planned as of 02/25/12 (Dr. KDeatra Ina.  .Marland KitchenHARDWARE REMOVAL Right 08/12/2012   Procedure: HARDWARE REMOVAL;  Surgeon: CMcarthur Rossetti MD;  Location: [facility];  Service: Orthopedics;  Laterality: Right;  . HIP SURGERY Right 1954   Repair of slipped capital femoral epiphysis.  . INTRAOPERATIVE TRANSESOPHAGEAL ECHOCARDIOGRAM N/A 08/14/2013   Normal LV function. Procedure: INTRAOPERATIVE TRANSESOPHAGEAL ECHOCARDIOGRAM;  Surgeon: SMelrose Nakayama MD;  [place] [facility]  Service: Open Heart Surgery;  Laterality: N/A;  . LEFT HEART CATHETERIZATION WITH CORONARY ANGIOGRAM N/A 08/08/2013   Procedure: LEFT HEART CATHETERIZATION WITH CORONARY ANGIOGRAM;  Surgeon: JJettie Booze MD;  [facility];  Service: Cardiovascular;  Laterality: N/A;  . PATELLA FRACTURE SURGERY Left ~ 1979   bolt + 3 screws to repair tib plateau fx  . PERICARDIAL TAP N/A 07/01/2013   Procedure: PERICARDIAL TAP;  Surgeon: JJettie Booze MD;  [facility];  Service: Cardiovascular;  Laterality: N/A;  . RIGHT/LEFT HEART CATH AND CORONARY ANGIOGRAPHY N/A 06/10/2016   Procedure: Right/Left Heart Cath and Coronary Angiography;  Surgeon: Larey Dresser, MD;  Location: [facility] LAB;  Service: Cardiovascular;  Laterality: N/A;  . TESTICLE SURGERY  as a child   Undescended testicle brought down into scrotum  . TONSILLECTOMY  1947  . TOTAL HIP ARTHROPLASTY Right 08/12/2012   Procedure: REMOVAL OF OLD PINS RIGHT HIP AND RIGHT TOTAL HIP ARTHROPLASTY ANTERIOR APPROACH;  Surgeon: Mcarthur Rossetti, MD;  [facility];  Service: Orthopedics;  Laterality: Right;  . TRANSESOPHAGEAL ECHOCARDIOGRAM  09/22/2016   EF 40-45%; diffuse hypokinesis, Grd I DD.  [name] TRANSTHORACIC ECHOCARDIOGRAM  10/30/14   Mod LVH, EF 60-65%, normal wall motion, mod mitral regurg, mild PAH  . TRANSTHORACIC ECHOCARDIOGRAM  06/2016; 10/01/17   06/2016: EF 25-30%, global hypokinesis, grd II DD, vent septum motion changes c/w LBBB, mod MV  regurg, L atr mod/sev dilat, R atr mod dilat, mod incr pulm press, Grd II DD.  09/2017: EF 50-55%, Hypokinesis of the basal inferolateral wall, grd I DD, mild MR, +LAE.    Family History  Problem Relation Age of Onset  . Heart disease Father   . Heart attack Father   . CVA Mother   . Hypertension Mother   . Diabetes Paternal Grandmother   . Breast cancer Sister   . Diabetes Maternal Uncle   . Heart disease Maternal Uncle   . Stroke Neg Hx     Social History:  reports that he has been smoking cigarettes. He has a 64.00 pack-year smoking history. He quit smokeless tobacco use about 4 years ago. He reports that he does not drink alcohol or use drugs.   Review of Systems  He thinks his weakness is not as much as on his last visit  Taking Remeron and Zoloft for depression  Lipid history: He had been taking Lipitor 40 mg Has had good control of LDL but he has increased triglycerides Has been prescribed Vascepa was refilled in August    Lab Results  Component Value Date   CHOL 103 12/16/2017   HDL 40.40 12/16/2017   LDLCALC 42 12/16/2017   LDLDIRECT 47.0 04/26/2017   TRIG 105.0 12/16/2017   CHOLHDL 3 12/16/2017           Hypertension:Mild and controlled with bisoprolol 5 mg,25 mg losartan He is on Demadex for CHF  Followed by cardiologist   BP Readings from Last 3 Encounters:  12/28/17 140/68  12/27/17 (!) 133/58  12/16/17 (!) 161/64   Lab Results  Component Value Date   K 3.6 12/27/2017     Most recent eye exam was 02/2017, has had no retinopathy     LABS:    Physical Examination:  BP 140/68 (BP Location: Left Arm, Patient Position: Sitting, Cuff Size: Normal)   Pulse 76   Ht _0  (1.803 m)   Wt 227 lb (103 kg)   SpO2 94%   BMI 31.66 kg/m       ASSESSMENT:  Diabetes type 2, uncontrolled, on the V-go insulin pump with U-500 insulin  See history of present illness for detailed discussion of current diabetes management, blood sugar patterns and  problems identified  His A1c is much better at 6.8  Not clear why he is becoming more insulin sensitive over the last 2 to 3 months and requiring less insulin May have been attributed to reduce glucose toxicity once he was on the insulin pump and having improved blood sugars  Currently taking U-500, a total of about 29 units daily  His blood sugars are significantly high  after breakfast and lunch but tend to come down significantly overnight Also recently has not been taking his Synjardy since this was not continued during his hospitalization He tends to have a large meal at breakfast and somewhat less at lunch and smallest meal may be in the evening However still does not understand the need to take the U-500 insulin 30 minutes before eating Discussed day-to-day management of his insulin, blood sugar targets, monitoring, use of the freestyle libre if he can qualify as well as having balanced meals with low fat content     PLAN:    Restart Synjardy Increase morning insulin to 15 units and lunchtime dose to 12 units He will reduce his suppertime dose to 4 units for now He will call back on Friday to report his blood sugars and will adjust his insulin doses further He will need to check his blood sugars 4 times a day and document this to be eligible for the freestyle libre Must take his insulin 30 minutes before eating Avoid high-fat meats Increase exercise as tolerated     Patient Instructions  Check sugar 4 x daily  On prednisone 15 before Bkfst anf 12 before lunch and 4 units before supper  Insulin 30 MIN BEFORE ALL MEALS  Back on Synjardy   Counseling time on subjects discussed in assessment and plan sections is over 50% of today's 25 minute visit    Elayne Snare 12/28/2017, 4:09 PM   Note: This office note was prepared with Dragon voice recognition system technology. Any transcriptional errors that result from this process are unintentional.    Elayne Snare

## 2017-12-29 NOTE — Telephone Encounter (Signed)
This order is ok with me.

## 2017-12-30 NOTE — Telephone Encounter (Signed)
LM requesting call back to complete TCM and confirm hospital follow up.

## 2017-12-31 NOTE — Telephone Encounter (Signed)
Spoke with patients wife.   Transition Care Management Follow-up Telephone Call  Admit date: 12/22/2017 Discharge date: 12/27/2017 Diagnosis: Acute Respiratory Failure with hypoxia, COPD exacerbation    How have you been since you were released from the hospital? Wife states patient is not doing well. Reports pulse ox remaining less than 90%, currently on 2-4 L oxygen. Wife suggested contacting PCP this morning, patient declined.    Do you understand why you were in the hospital? yes   Do you understand the discharge instructions? yes   Where were you discharged to? Home. Lives with wife.    Items Reviewed:  Medications reviewed: no, advised to bring med/list to f/u appt.   Allergies reviewed: yes  Dietary changes reviewed: no  Referrals reviewed: yes   Functional Questionnaire:   Activities of Daily Living (ADLs):   He states they are independent in the following: ambulation, bathing and hygiene, feeding, continence, grooming, toileting and dressing States they require assistance with the following: None.    Any transportation issues/concerns?: no   Any patient concerns? yes, wife concerned Pulse Ox staying below 90%. Pt declined to go back to ER. Hosp f/u appointment moved up. Advised if respiratory status declines further, to call 911 for transport to ER. Wife verbalized understanding.    Confirmed importance and date/time of follow-up visits scheduled yes  Provider Appointment booked with PCP Tuesday, 01/04/18 @ 0930.   Confirmed with patient if condition begins to worsen call PCP or go to the ER.  Patient was given the office number and encouraged to call back with question or concerns.  : yes

## 2018-01-03 ENCOUNTER — Telehealth: Payer: Self-pay | Admitting: Endocrinology

## 2018-01-03 NOTE — Telephone Encounter (Signed)
Please confirm that you would like the pt to be getting 50 units SQ at breakfast and lunch, and 42.5 units at dinner of Humulin U-500.

## 2018-01-03 NOTE — Telephone Encounter (Signed)
[employer] is calling to confirm increase of patients insulin regular human CONCENTRATED (HUMULIN R) 500 UNIT/ML injection dosage, states the dosage the patient provided seems high. Please Advise, thanks. Ph # [phone] ext [phone] Leave Detailed Message

## 2018-01-03 NOTE — Telephone Encounter (Signed)
And that is how many units in U-500 units?

## 2018-01-03 NOTE — Telephone Encounter (Signed)
He draws up 15 units for 12 minutes on the U-500 syringe

## 2018-01-03 NOTE — Telephone Encounter (Signed)
He is getting 0.15 mL at breakfast and lunch and 0.12 at dinnertime

## 2018-01-04 ENCOUNTER — Encounter: Payer: Self-pay | Admitting: Family Medicine

## 2018-01-04 ENCOUNTER — Ambulatory Visit (INDEPENDENT_AMBULATORY_CARE_PROVIDER_SITE_OTHER): Payer: Medicare Other | Admitting: Family Medicine

## 2018-01-04 VITALS — BP 133/64 | HR 65 | Temp 98.7°F | Resp 16 | Ht 71.0 in | Wt 229.1 lb

## 2018-01-04 DIAGNOSIS — J441 Chronic obstructive pulmonary disease with (acute) exacerbation: Secondary | ICD-10-CM | POA: Diagnosis not present

## 2018-01-04 DIAGNOSIS — I480 Paroxysmal atrial fibrillation: Secondary | ICD-10-CM | POA: Diagnosis not present

## 2018-01-04 DIAGNOSIS — I5041 Acute combined systolic (congestive) and diastolic (congestive) heart failure: Secondary | ICD-10-CM | POA: Diagnosis not present

## 2018-01-04 LAB — BASIC METABOLIC PANEL
BUN: 31 mg/dL — ABNORMAL HIGH (ref 6–23)
CO2: 32 mEq/L (ref 19–32)
Calcium: 8.9 mg/dL (ref 8.4–10.5)
Chloride: 99 mEq/L (ref 96–112)
Creatinine, Ser: 0.99 mg/dL (ref 0.40–1.50)
GFR: 77.56 mL/min (ref >60.00)
Glucose, Bld: 210 mg/dL — ABNORMAL HIGH (ref 70–99)
Potassium: 4.2 mEq/L (ref 3.5–5.1)
Sodium: 141 mEq/L (ref 135–145)

## 2018-01-04 MED ORDER — IPRATROPIUM BROMIDE 0.03 % NA SOLN: 2.0000 | Freq: Two times a day (BID) | NASAL | 6 refills | Status: DC

## 2018-01-04 NOTE — Progress Notes (Signed)
01/04/2018  CC:  Chief Complaint  Patient presents with  . Hospitalization Follow-up    TCM    Patient is a 77 y.o. Caucasian male who presents for  hospital follow up, specifically [facility] face-to-face visit. Dates hospitalized: 12/18-12/23, 2019. Days since d/c from hospital: 8 days. Patient was discharged from hospital to home. Reason for admission to hospital: acute hypoxic resp failure, acute exac of chronic combined syst/diast CHF, COPD exacerbation. Date of interactive (phone) contact with patient and/or caregiver: 12/28/17  I have reviewed patient's discharge summary plus pertinent specific notes, labs, and imaging from the hospitalization.   Cardiology followed pt in hosp, diuresis (wt went from 107 down to 103 kg) led to improved oxygenation.  Home oxygen evaluation was done--wears 2-3 L 24/7-->concentrator only, no portable right now. Torsemide 71m qod recommended at d/c home per d/c summary, but wife says she was told to give it to him EVERY DAY (d/c wt 103.4 kg).   Was sent home to finish prednisone course (finished 01/02/18). Was to continue bisoprolol and xarelto for his PAF. His DM has been much better controlled of late, A1c in hosp was 6.8%.  Was taken off his synjardy in hosp and given instructions not to restart until endo says so.  Pt still with signif cough when supine, feels mucous in back of throat, coughs up clear mucous.   Has more SOB on his back as well.  Sleep position: head of bed raised. No fever.  Feels SOB with just a few steps.  Uses 2.5L oxygen 24/7. Appetite is good, he does limit sodium. He drinks about 2.3 L of water per day.  Also a 16 oz pepsi per day.  1 cup of coffee and 1 glass milk each morning.  No chest pain, no dizziness, no palpitations, no feeling of abd distention, no feeling that LL's are swollen, no fevers.  No melena or hematochezia.  No focal weakness.  Urine output is GREAT.  No constipation or diarrhea.  No n/v.   No abd pain.    He followed up with Dr. KDwyane Dee endo, on 12/28/17 and plan was to restart Synjardy, Increase morning insulin (U-500) to 15 units and lunchtime dose to 12 units and reduce his suppertime dose to 4 units for now    PMH:  Past Medical History:  Diagnosis Date  . Acute respiratory distress 06/04/2016  . Adenomatous colon polyp 10/16/2011   Repeat 2018  . Arthritis     Hips, R>L & KNEES  . CAD, multiple vessel    3V CAD cath 08/08/13----CABG done shortly after.  Cath 06/2016: clear bipasses/no interventional lesion.  . 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 pylori neg, no dysplasia or malignancy.  . Chronic combined systolic and diastolic CHF, NYHA class 2 ([place] 2016-2018   Followed by [facility].  Transesoph Echo 09/2016 EF improved to 40-45%; diffuse hypokinesis, Grd I DD.  09/2017 EF 50-55%, grd I DD.  .Marland KitchenChronic hypoxemic respiratory failure (HCC)    HISTORY OF(2018): oxygen testing normal in office 11/10/17, no home oxygen was not recertified.  . Chronic renal insufficiency, stage 3 (moderate) ([facility] 2018   GFR 50s  . COPD (chronic obstructive pulmonary disease) ([place]    GOLD II.  Spirometry  2004 borderline obstruction; 2015 mod obst: noncompliant with bid symbicort so pulmonologist switched him to a once daily inhaler: Trelegy ellipta 12/2015.  Oxygen prn: goal 88-92%.  . Diabetic nephropathy (HCC)    Elevated  urine microalb/cr 03/2011  . DM type 2 (diabetes mellitus, type 2) ([place])    Poor control on max oral meds--pt eventually agreed to insulin therapy.  As of 2017 his DM is being managed by Dr. Dwyane Dee in endo.  On insulin pump as of 04/2017 endo f/u.  [name] DOE (dyspnea on exertion)    COPD + chronic diastolic HF  . Dysthymia    Worsened after 2015 heart surgery.  Memory loss +? as well.  Neuro eval 01/2017--plan is to get MRI brain, B12 level, and neuropsychiatric testing.  . Erectile dysfunction    Normal testosterone  . Hearing  loss of both ears 2016   Hearing aids  . Hyperplastic colon polyp 2001   No further colonoscopies to be done as of 11/2016 GI eval.  . Hypertension   . IBS (irritable bowel syndrome)    responds relatively well to prn levsin.  Pepto bismol q AM trial by Dr. Loletha Carrow 08/2017, also checking fecal elastace and lactoferrin.  . Iron deficiency anemia 2014   03/2012 capsule endoscopy showed 2 AVMs--likely responsible for his IDA--lifetime iron supp recommended + q19moCBCs.  . Ischemic cardiomyopathy 06/2016; 09/2016   06/2016: EF 25-30%, global hypokinesis, grd II DD, vent septum motion changes c/w LBBB, mod MV regurg, L atr mod/sev dilat, R atr mod dilat, mod incr pulm press, Grd II DD.  09/2016--EF 40-45%; diffuse hypokinesis, Grd I DD.  . Macular degeneration, dry    Mild, bilat (Optometrist, DMayford Knifeat MK[employer]of [place]  . Mild cognitive impairment    MCI, likley vascular etiology, + MDD, recurrent-->neuropsych testing 06/2017.  Neurol to repeat testing 1 yr  . Mixed hyperlipidemia    statin and vascepa as of 04/2017  . Nocturnal hypoxemia   . Obesity   . Open toe wound 11/2015   Wound care clinic appt made and then canceled when toe improved.  . Other and unspecified angina pectoris   . PAD (peripheral artery disease) ([facility] 02/2016   Abnormal ABI's and waveforms: LE arterial duplex ordered for f/u as per cardiologist's recommendation.  Vasc eval by Dr. CBridgett Larsson impression was minimal PAD, recommended maximize med mgmt.  .Marland KitchenPAF (paroxysmal atrial fibrillation) ([place] 10/2014   xarelto  . Pericardial effusion with cardiac tamponade 6//29/15   pericardiocentesis was done,  Infectious/inflamm (cytology showed NO MALIGNANT CELLS)  . Pleural plaque    Pleural plaques/asbestosis changes on CT chest done by pulm 05/2016.  .Marland KitchenPneumonia   . Tobacco dependence in remission    100+ pack-yr hx: quit after CABG    PSH:  Past Surgical History:  Procedure Laterality Date  .  APPENDECTOMY  1957  . CARDIAC CATHETERIZATION  08/08/2013  . CATARACT EXTRACTION W/ INTRAOCULAR LENS  IMPLANT, BILATERAL  04/08/2006 & 04/22/2006  . CATARACT EXTRACTION W/ INTRAOCULAR LENS  IMPLANT, BILATERAL Bilateral   . CHOLECYSTECTOMY OPEN  1999  . COLONOSCOPY  10/16/2011   Procedure: COLONOSCOPY;  Surgeon: RInda Castle MD;  Location: [facility];  Service: Endoscopy;  Laterality: N/A;  . CORONARY ARTERY BYPASS GRAFT N/A 08/14/2013   Procedure: CORONARY ARTERY BYPASS GRAFTING (CABG);  Surgeon: SMelrose Nakayama MD;  [facility] [facility]  Service: Open Heart Surgery;  Laterality: N/A;  Times 4   using left internal mammary artery and endoscopically harvested bilateral saphenous vein  . ESOPHAGOGASTRODUODENOSCOPY  02/25/12   Atrophic gastritis with a few erosions--capsule endoscopy planned as of 02/25/12 (Dr. KDeatra Ina.  .Marland KitchenHARDWARE REMOVAL Right 08/12/2012  Procedure: HARDWARE REMOVAL;  Surgeon: Mcarthur Rossetti, MD;  Location: [facility];  Service: Orthopedics;  Laterality: Right;  . HIP SURGERY Right 1954   Repair of slipped capital femoral epiphysis.  . INTRAOPERATIVE TRANSESOPHAGEAL ECHOCARDIOGRAM N/A 08/14/2013   Normal LV function. Procedure: INTRAOPERATIVE TRANSESOPHAGEAL ECHOCARDIOGRAM;  Surgeon: Melrose Nakayama, MD;  Location: [place];  Service: Open Heart Surgery;  Laterality: N/A;  . LEFT HEART CATHETERIZATION WITH CORONARY ANGIOGRAM N/A 08/08/2013   Procedure: LEFT HEART CATHETERIZATION WITH CORONARY ANGIOGRAM;  Surgeon: Jettie Booze, MD;  Location: [facility] CATH LAB;  Service: Cardiovascular;  Laterality: N/A;  . PATELLA FRACTURE SURGERY Left ~ 1979   bolt + 3 screws to repair tib plateau fx  . PERICARDIAL TAP N/A 07/01/2013   Procedure: PERICARDIAL TAP;  Surgeon: Jettie Booze, MD;  Location: [facility] CATH LAB;  Service: Cardiovascular;  Laterality: N/A;  . RIGHT/LEFT HEART CATH AND CORONARY ANGIOGRAPHY N/A 06/10/2016   Procedure: Right/Left Heart Cath and Coronary Angiography;   Surgeon: Larey Dresser, MD;  Location: [place] CV LAB;  Service: Cardiovascular;  Laterality: N/A;  . TESTICLE SURGERY  as a child   Undescended testicle brought down into scrotum  . TONSILLECTOMY  1947  . TOTAL HIP ARTHROPLASTY Right 08/12/2012   Procedure: REMOVAL OF OLD PINS RIGHT HIP AND RIGHT TOTAL HIP ARTHROPLASTY ANTERIOR APPROACH;  Surgeon: Mcarthur Rossetti, MD;  Location: [facility];  Service: Orthopedics;  Laterality: Right;  . TRANSESOPHAGEAL ECHOCARDIOGRAM  09/22/2016   EF 40-45%; diffuse hypokinesis, Grd I DD.  [name] TRANSTHORACIC ECHOCARDIOGRAM  10/30/14   Mod LVH, EF 60-65%, normal wall motion, mod mitral regurg, mild PAH  . TRANSTHORACIC ECHOCARDIOGRAM  06/2016; 10/01/17   06/2016: EF 25-30%, global hypokinesis, grd II DD, vent septum motion changes c/w LBBB, mod MV regurg, L atr mod/sev dilat, R atr mod dilat, mod incr pulm press, Grd II DD.  09/2017: EF 50-55%, Hypokinesis of the basal inferolateral wall, grd I DD, mild MR, +LAE.    MEDS:  Outpatient Medications Prior to Visit  Medication Sig Dispense Refill  . ACCU-CHEK GUIDE test strip USE TO CHECK BLOOD SUGAR 3 TIMES DAILY 100 each 11  . acetaminophen (TYLENOL) 325 MG tablet Take 2 tablets (650 mg total) by mouth every 4 (four) hours as needed for headache or mild pain.    . Ascorbic Acid (VITAMIN C) 1000 MG tablet Take 1,000 mg by mouth daily.    [name] atorvastatin (LIPITOR) 40 MG tablet TAKE ONE TABLET BY MOUTH DAILY 90 tablet 0  . bisoprolol (ZEBETA) 5 MG tablet Take 1 tablet (5 mg total) by mouth daily. 30 tablet 11  . Cholecalciferol (VITAMIN D-3) 1000 units CAPS Take 1,000 Units by mouth daily. Reported on 02/26/2015    . Empagliflozin-metFORMIN HCl (SYNJARDY) 05-998 MG TABS Take 1 tablet by mouth daily.    . fluticasone (FLONASE) 50 MCG/ACT nasal spray Place 2 sprays into both nostrils daily. (Patient taking differently: Place 2 sprays into both nostrils as needed. ) 16 g 1  . gabapentin (NEURONTIN) 100 MG capsule TAKE  TWO CAPSULES BY MOUTH THREE TIMES DAILY (Patient taking differently: Take 300 mg by mouth 2 (two) times daily. ) 180 capsule 11  . insulin regular human CONCENTRATED (HUMULIN R) 500 UNIT/ML injection Inject 0.34m under the skin 3 times daily. (Patient taking differently: Inject 42.5-50 Units into the skin 3 (three) times daily with meals. Pt is drawing syringe up to 0.190m(50 units) in the morning and afternoon, and 0.085 ml (  42.5 units) at bedtime.) 20 mL 0  . isosorbide mononitrate (IMDUR) 30 MG 24 hr tablet Take 30 mg by mouth daily. TAKE 1 TABLET BY MOUTH ONCE DAILY.    [name] lipase/protease/amylase (CREON) 36000 UNITS CPEP capsule Take 2 capsules (72,000 Units total) by mouth 3 (three) times daily with meals. 780 capsule 3  . mirtazapine (REMERON) 45 MG tablet Take 1 tablet (45 mg total) by mouth at bedtime. 30 tablet 5  . Multiple Vitamin (MULTIVITAMIN WITH MINERALS) TABS Take 1 tablet by mouth daily.    . nitroGLYCERIN (NITROSTAT) 0.4 MG SL tablet PLACE 1 TABLET UNDER THE TONGUE EVERY 5 MINUTES AS NEEDED FOR CHEST PAIN (Patient taking differently: Place 0.4 mg under the tongue every 5 (five) minutes as needed for chest pain. ) 25 tablet 3  . PROAIR HFA 108 (90 Base) MCG/ACT inhaler INHALE 2 PUFFS INTO THE LUNGS EVERY 4 HOURS AS NEEDED FOR WHEEZING ORSHORTNESS OF BREATH (Patient taking differently: Inhale 2 puffs into the lungs every 4 (four) hours as needed for wheezing or shortness of breath. ) 8.5 g 1  . sertraline (ZOLOFT) 25 MG tablet Take 2 tablets (50 mg total) by mouth at bedtime. 60 tablet 5  . SYMBICORT 160-4.5 MCG/ACT inhaler INHALE 2 PUFFS INTO THE LUNGS TWICE DAILY (Patient taking differently: Inhale 2 puffs into the lungs 2 (two) times daily. ) 10.2 g 5  . tamsulosin (FLOMAX) 0.4 MG CAPS capsule TAKE ONE CAPSULE BY MOUTH DAILY AFTER SUPPER 90 capsule 1  . torsemide (DEMADEX) 20 MG tablet Take 20 mg every other day 60 tablet 6  . VASCEPA 1 g CAPS TAKE TWO CAPSULES BY MOUTH TWICE DAILY 120  capsule 2  . XARELTO 20 MG TABS tablet TAKE ONE TABLET BY MOUTH DAILY WITH SUPPER (Patient taking differently: Take 20 mg by mouth daily with supper. ) 30 tablet 11   No facility-administered medications prior to visit.    EXAM: BP 133/64 (BP Location: Left Arm, Patient Position: Sitting, Cuff Size: Normal)   Pulse 65   Temp 98.7 F (37.1 C) (Oral)   Resp 16   Ht _0  (1.803 m)   Wt 229 lb 2 oz (103.9 kg)   SpO2 91%   BMI 31.96 kg/m .  91% RA at rest after walking through clinic into exam room today. Gen: alert, NAD, oriented x 4. AFFECT: pleasant, lucid thought and speech. Comfortable with sitting and with getting up onto exam table. TLX:BWIO: no injection, icteris, swelling, or exudate.  EOMI, PERRLA. Mouth: lips without lesion/swelling.  Oral mucosa pink and moist. Oropharynx without erythema, exudate, or swelling.  CV: RRR, no m/r/g.   LUNGS: CTA bilat, nonlabored resps, good aeration in all lung fields. ABD: soft, ND/NT EXT: no clubbing or cyanosis.  1+ R LL pitting edema.  2+ L LL pitting edema. SKIN: no pallor or jaundice.  Pertinent labs/imaging Lab Results  Component Value Date   TSH 1.612 08/28/2016   Lab Results  Component Value Date   WBC 11.9 (H) 12/25/2017   HGB 13.4 12/25/2017   HCT 44.1 12/25/2017   MCV 87.3 12/25/2017   PLT 180 12/25/2017   Lab Results  Component Value Date   CREATININE 0.90 12/27/2017   BUN 40 (H) 12/27/2017   NA 143 12/27/2017   K 3.6 12/27/2017   CL 104 12/27/2017   CO2 31 12/27/2017   Lab Results  Component Value Date   ALT 16 09/30/2017   AST 18 09/30/2017   ALKPHOS 63 09/30/2017  BILITOT 0.4 09/30/2017   Lab Results  Component Value Date   CHOL 103 12/16/2017   Lab Results  Component Value Date   HDL 40.40 12/16/2017   Lab Results  Component Value Date   LDLCALC 42 12/16/2017   Lab Results  Component Value Date   TRIG 105.0 12/16/2017   Lab Results  Component Value Date   CHOLHDL 3 12/16/2017   Lab  Results  Component Value Date   HGBA1C 6.8 (H) 12/24/2017   12/22/17 TWO VIEW CXR: CLINICAL DATA:  One day of shortness of breath. History of COPD, pneumonia, former smoker. Also atrial fibrillation.  EXAM: CHEST - 2 VIEW  COMPARISON:  Portable chest x-ray of October 01, 2017  FINDINGS: The lungs are well-expanded. The interstitial markings are mildly increased. The heart is top-normal in size. The pulmonary vascularity is prominent centrally with mild cephalization. There are small pleural effusions blunting the posterior costophrenic angles. There is calcification in the wall of the aortic arch. The patient has undergone previous CABG. The bony thorax exhibits no acute abnormality. There is dense calcification of the anterolateral longitudinal ligaments.  IMPRESSION: COPD with superimposed mild CHF. There small bilateral pleural effusions layering posteriorly. No acute pneumonia.  Thoracic aortic atherosclerosis.  DISH.   Electronically Signed   By: David  Martinique M.D.   On: 12/22/2017 13:29  ASSESSMENT/PLAN: Hosp f/u, TCM:  Acute hypoxic resp failure, acute exac of combined systolic and diastolic CHF, COPD exacerbation, PAF. He is improved, but not much ground has been made since d/c home. He is drinking too much fluid: approx 3.3 L/day. Plan: no further systemic steroids. Continue 2.5L oxygen 24/7, sat goal 90-94%. Check BMET today. Continue torsemide 36m qd. Decrease fluid intake to 2L per day. Regular rhythm currently, good bp and rate control, no sign of bleeding--->continue bisoprolol and xarelto.  Medical decision making of moderate complexity was utilized today.  An After Visit Summary was printed and given to the patient.  FOLLOW UP:  1 wk  Signed:  PCrissie Sickles MD           01/04/2018

## 2018-01-04 NOTE — Telephone Encounter (Signed)
Noted.  Saw pt in office today. Signed:  Crissie Sickles, MD           01/04/2018

## 2018-01-04 NOTE — Patient Instructions (Signed)
Cut out 1 bottle of water per day and cut out the coke/pepsi. Continue taking 1 water pill every day (torsemide). Restart mucinex DM around bedtime. Take the new nasal spray I prescribed as directed on packaging.

## 2018-01-06 ENCOUNTER — Inpatient Hospital Stay: Payer: Self-pay | Admitting: Family Medicine

## 2018-01-11 ENCOUNTER — Ambulatory Visit (INDEPENDENT_AMBULATORY_CARE_PROVIDER_SITE_OTHER): Payer: Medicare Other | Admitting: Family Medicine

## 2018-01-11 ENCOUNTER — Encounter: Payer: Self-pay | Admitting: *Deleted

## 2018-01-11 ENCOUNTER — Encounter: Payer: Self-pay | Admitting: Family Medicine

## 2018-01-11 ENCOUNTER — Ambulatory Visit (HOSPITAL_BASED_OUTPATIENT_CLINIC_OR_DEPARTMENT_OTHER)
Admission: RE | Admit: 2018-01-11 | Discharge: 2018-01-11 | Disposition: A | Discharge status: Home / Self Care | Payer: Medicare Other | Source: Ambulatory Visit | Attending: Family Medicine | Admitting: Family Medicine

## 2018-01-11 VITALS — BP 108/44 | HR 58 | Temp 98.4°F | Resp 16 | Ht 71.0 in | Wt 230.5 lb

## 2018-01-11 DIAGNOSIS — J441 Chronic obstructive pulmonary disease with (acute) exacerbation: Secondary | ICD-10-CM | POA: Diagnosis not present

## 2018-01-11 DIAGNOSIS — R0609 Other forms of dyspnea: Secondary | ICD-10-CM | POA: Insufficient documentation

## 2018-01-11 DIAGNOSIS — R059 Cough, unspecified: Secondary | ICD-10-CM

## 2018-01-11 DIAGNOSIS — J9611 Chronic respiratory failure with hypoxia: Secondary | ICD-10-CM | POA: Diagnosis not present

## 2018-01-11 DIAGNOSIS — R05 Cough: Secondary | ICD-10-CM

## 2018-01-11 DIAGNOSIS — I5043 Acute on chronic combined systolic (congestive) and diastolic (congestive) heart failure: Secondary | ICD-10-CM | POA: Diagnosis not present

## 2018-01-11 DIAGNOSIS — J449 Chronic obstructive pulmonary disease, unspecified: Secondary | ICD-10-CM | POA: Diagnosis not present

## 2018-01-11 LAB — BASIC METABOLIC PANEL
BUN: 21 mg/dL (ref 6–23)
CALCIUM: 8.7 mg/dL (ref 8.4–10.5)
CO2: 30 mEq/L (ref 19–32)
Chloride: 105 mEq/L (ref 96–112)
Creatinine, Ser: 0.96 mg/dL (ref 0.40–1.50)
GFR: 80.36 mL/min (ref >60.00)
Glucose, Bld: 214 mg/dL — ABNORMAL HIGH (ref 70–99)
Potassium: 4.4 mEq/L (ref 3.5–5.1)
Sodium: 142 mEq/L (ref 135–145)

## 2018-01-11 MED ORDER — ALBUTEROL SULFATE (2.5 MG/3ML) 0.083% IN NEBU: 2.5000 mg | INHALATION_SOLUTION | Freq: Four times a day (QID) | RESPIRATORY_TRACT | 0 refills | Status: AC | PRN

## 2018-01-11 MED ORDER — PREDNISONE 10 MG PO TABS: ORAL_TABLET | ORAL | 0 refills | Status: DC

## 2018-01-11 NOTE — Progress Notes (Signed)
OFFICE VISIT  01/14/2018   CC:  Chief Complaint  Patient presents with  . Follow-up    to TCM HFU 1 week ago     HPI:    Patient is a 78 y.o. Caucasian male who presents for 1 week f/u. At last visit, TCM hosp f/u, I felt like he was slow to improve regarding recent acute hypoxic resp failure. I advised no further prednisone.  I kept him on torsemide 31m qd and stressed the need to limit fluid intake to 2 L per day and to limit sodium intake to 2 g/day. He was on 2.5L oxygen 24/7 and I recommended he continue oxygen to keep sats 90-94%.  His BMET showed GFR 77 ml/min, electrolytes normal at that time.  Interim hx: feels no different--some upper airway mucous + excessive coughing, still very weak.  He walks with a cane outside of house, uses walker inside house, no falls since last visit.   Has tried to limit fluid intake some. Didn't take torsemide yet today. Oxygenation at home on 2.5L 88%.  He says average is 85-90% but he just checks it once in AM. He is compliant with his symbicort but says he has not been using his albuterol much at all.  The albut helps some.   Past Medical History:  Diagnosis Date  . Acute respiratory distress 06/04/2016  . Adenomatous colon polyp 10/16/2011   Repeat 2018  . Arthritis     Hips, R>L & KNEES  . CAD, multiple vessel    3V CAD cath 08/08/13----CABG done shortly after.  Cath 06/2016: clear bipasses/no interventional lesion.  . 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 pylori neg, no dysplasia or malignancy.  . Chronic combined systolic and diastolic CHF, NYHA class 2 ([place] 2016-2018   Followed by [facility].  Transesoph Echo 09/2016 EF improved to 40-45%; diffuse hypokinesis, Grd I DD.  09/2017 EF 50-55%, grd I DD.  .Marland KitchenChronic hypoxemic respiratory failure (HCC)    HISTORY OF(2018): oxygen testing normal in office 11/10/17, no home oxygen was not recertified.  . Chronic renal insufficiency, stage 3  (moderate) ([place] 2018   GFR 50s  . COPD (chronic obstructive pulmonary disease) ([place]    GOLD II.  Spirometry  2004 borderline obstruction; 2015 mod obst: noncompliant with bid symbicort so pulmonologist switched him to a once daily inhaler: Trelegy ellipta 12/2015.  Oxygen prn: goal 88-92%.  . Diabetic nephropathy ([place]    Elevated urine microalb/cr 03/2011  . DM type 2 (diabetes mellitus, type 2) ([place]    Poor control on max oral meds--pt eventually agreed to insulin therapy.  As of 2017 his DM is being managed by Dr. KDwyane Deein endo.  On insulin pump as of 04/2017 endo f/u.  .Marland KitchenDOE (dyspnea on exertion)    COPD + chronic diastolic HF  . Dysthymia    Worsened after 2015 heart surgery.  Memory loss +? as well.  Neuro eval 01/2017--plan is to get MRI brain, B12 level, and neuropsychiatric testing.  . Erectile dysfunction    Normal testosterone  . Hearing loss of both ears 2016   Hearing aids  . Hyperplastic colon polyp 2001   No further colonoscopies to be done as of 11/2016 GI eval.  . Hypertension   . IBS (irritable bowel syndrome)    responds relatively well to prn levsin.  Pepto bismol q AM trial by Dr. DLoletha Carrow8/2019, also checking fecal elastace and lactoferrin.  . Iron deficiency  anemia 2014   03/2012 capsule endoscopy showed 2 AVMs--likely responsible for his IDA--lifetime iron supp recommended + q82moCBCs.  . Ischemic cardiomyopathy 06/2016; 09/2016   06/2016: EF 25-30%, global hypokinesis, grd II DD, vent septum motion changes c/w LBBB, mod MV regurg, L atr mod/sev dilat, R atr mod dilat, mod incr pulm press, Grd II DD.  09/2016--EF 40-45%; diffuse hypokinesis, Grd I DD.  . Macular degeneration, dry    Mild, bilat (Optometrist, DMayford Knifeat MKinder [employer] Energyof [place]  . Mild cognitive impairment    MCI, likley vascular etiology, + MDD, recurrent-->neuropsych testing 06/2017.  Neurol to repeat testing 1 yr  . Mixed hyperlipidemia    statin and vascepa as of 04/2017  .  Nocturnal hypoxemia   . Obesity   . Open toe wound 11/2015   Wound care clinic appt made and then canceled when toe improved.  . Other and unspecified angina pectoris   . PAD (peripheral artery disease) ([place] 02/2016   Abnormal ABI's and waveforms: LE arterial duplex ordered for f/u as per cardiologist's recommendation.  Vasc eval by Dr. CBridgett Larsson impression was minimal PAD, recommended maximize med mgmt.  .Marland KitchenPAF (paroxysmal atrial fibrillation) (HG[place] 10/2014   xarelto  . Pericardial effusion with cardiac tamponade 6//29/15   pericardiocentesis was done,  Infectious/inflamm (cytology showed NO MALIGNANT CELLS)  . Pleural plaque    Pleural plaques/asbestosis changes on CT chest done by pulm 05/2016.  .Marland KitchenPneumonia   . Tobacco dependence in remission    100+ pack-yr hx: quit after CABG    Past Surgical History:  Procedure Laterality Date  . APPENDECTOMY  1957  . CARDIAC CATHETERIZATION  08/08/2013  . CATARACT EXTRACTION W/ INTRAOCULAR LENS  IMPLANT, BILATERAL  04/08/2006 & 04/22/2006  . CATARACT EXTRACTION W/ INTRAOCULAR LENS  IMPLANT, BILATERAL Bilateral   . CHOLECYSTECTOMY OPEN  1999  . COLONOSCOPY  10/16/2011   Procedure: COLONOSCOPY;  Surgeon: RInda Castle MD;  Location: [facility];  Service: Endoscopy;  Laterality: N/A;  . CORONARY ARTERY BYPASS GRAFT N/A 08/14/2013   Procedure: CORONARY ARTERY BYPASS GRAFTING (CABG);  Surgeon: SMelrose Nakayama MD;  [facility]: [facility]  Service: Open Heart Surgery;  Laterality: N/A;  Times 4   using left internal mammary artery and endoscopically harvested bilateral saphenous vein  . ESOPHAGOGASTRODUODENOSCOPY  02/25/12   Atrophic gastritis with a few erosions--capsule endoscopy planned as of 02/25/12 (Dr. KDeatra Ina.  .Marland KitchenHARDWARE REMOVAL Right 08/12/2012   Procedure: HARDWARE REMOVAL;  Surgeon: CMcarthur Rossetti MD;  Location: [facility];  Service: Orthopedics;  Laterality: Right;  . HIP SURGERY Right 1954   Repair of slipped capital femoral epiphysis.  .  INTRAOPERATIVE TRANSESOPHAGEAL ECHOCARDIOGRAM N/A 08/14/2013   Normal LV function. Procedure: INTRAOPERATIVE TRANSESOPHAGEAL ECHOCARDIOGRAM;  Surgeon: SMelrose Nakayama MD;  [facility]  Service: Open Heart Surgery;  Laterality: N/A;  . LEFT HEART CATHETERIZATION WITH CORONARY ANGIOGRAM N/A 08/08/2013   Procedure: LEFT HEART CATHETERIZATION WITH CORONARY ANGIOGRAM;  Surgeon: JJettie Booze MD;  [facility];  Service: Cardiovascular;  Laterality: N/A;  . PATELLA FRACTURE SURGERY Left ~ 1979   bolt + 3 screws to repair tib plateau fx  . PERICARDIAL TAP N/A 07/01/2013   Procedure: PERICARDIAL TAP;  Surgeon: JJettie Booze MD;  [facility];  Service: Cardiovascular;  Laterality: N/A;  . RIGHT/LEFT HEART CATH AND CORONARY ANGIOGRAPHY N/A 06/10/2016   Procedure: Right/Left Heart Cath and Coronary Angiography;  Surgeon: MLarey Dresser MD;  Location: [facility] CV LAB;  Service: Cardiovascular;  Laterality: N/A;  . TESTICLE SURGERY  as a child   Undescended testicle brought down into scrotum  . TONSILLECTOMY  1947  . TOTAL HIP ARTHROPLASTY Right 08/12/2012   Procedure: REMOVAL OF OLD PINS RIGHT HIP AND RIGHT TOTAL HIP ARTHROPLASTY ANTERIOR APPROACH;  Surgeon: Mcarthur Rossetti, MD;  Location: [facility];  Service: Orthopedics;  Laterality: Right;  . TRANSESOPHAGEAL ECHOCARDIOGRAM  09/22/2016   EF 40-45%; diffuse hypokinesis, Grd I DD.  [name] TRANSTHORACIC ECHOCARDIOGRAM  10/30/14   Mod LVH, EF 60-65%, normal wall motion, mod mitral regurg, mild PAH  . TRANSTHORACIC ECHOCARDIOGRAM  06/2016; 10/01/17   06/2016: EF 25-30%, global hypokinesis, grd II DD, vent septum motion changes c/w LBBB, mod MV regurg, L atr mod/sev dilat, R atr mod dilat, mod incr pulm press, Grd II DD.  09/2017: EF 50-55%, Hypokinesis of the basal inferolateral wall, grd I DD, mild MR, +LAE.    Outpatient Medications Prior to Visit  Medication Sig Dispense Refill  . ACCU-CHEK GUIDE test strip USE TO  CHECK BLOOD SUGAR 3 TIMES DAILY 100 each 11  . acetaminophen (TYLENOL) 325 MG tablet Take 2 tablets (650 mg total) by mouth every 4 (four) hours as needed for headache or mild pain.    . Ascorbic Acid (VITAMIN C) 1000 MG tablet Take 1,000 mg by mouth daily.    [name] atorvastatin (LIPITOR) 40 MG tablet TAKE ONE TABLET BY MOUTH DAILY 90 tablet 0  . bisoprolol (ZEBETA) 5 MG tablet Take 1 tablet (5 mg total) by mouth daily. 30 tablet 11  . Cholecalciferol (VITAMIN D-3) 1000 units CAPS Take 1,000 Units by mouth daily. Reported on 02/26/2015    . Empagliflozin-metFORMIN HCl (SYNJARDY) 05-998 MG TABS Take 1 tablet by mouth daily.    . fluticasone (FLONASE) 50 MCG/ACT nasal spray Place 2 sprays into both nostrils daily. (Patient taking differently: Place 2 sprays into both nostrils as needed. ) 16 g 1  . gabapentin (NEURONTIN) 100 MG capsule TAKE TWO CAPSULES BY MOUTH THREE TIMES DAILY (Patient taking differently: Take 300 mg by mouth 2 (two) times daily. ) 180 capsule 11  . insulin regular human CONCENTRATED (HUMULIN R) 500 UNIT/ML injection Inject 0.31m under the skin 3 times daily. (Patient taking differently: Inject 42.5-50 Units into the skin 3 (three) times daily with meals. Pt is drawing syringe up to 0.181m(50 units) in the morning and afternoon, and 0.085 ml (42.5 units) at bedtime.) 20 mL 0  . ipratropium (ATROVENT) 0.03 % nasal spray Place 2 sprays into both nostrils 2 (two) times daily. 30 mL 6  . mirtazapine (REMERON) 45 MG tablet Take 1 tablet (45 mg total) by mouth at bedtime. 30 tablet 5  . Multiple Vitamin (MULTIVITAMIN WITH MINERALS) TABS Take 1 tablet by mouth daily.    . nitroGLYCERIN (NITROSTAT) 0.4 MG SL tablet PLACE 1 TABLET UNDER THE TONGUE EVERY 5 MINUTES AS NEEDED FOR CHEST PAIN (Patient taking differently: Place 0.4 mg under the tongue every 5 (five) minutes as needed for chest pain. ) 25 tablet 3  . PROAIR HFA 108 (90 Base) MCG/ACT inhaler INHALE 2 PUFFS INTO THE LUNGS EVERY 4 HOURS AS  NEEDED FOR WHEEZING ORSHORTNESS OF BREATH (Patient taking differently: Inhale 2 puffs into the lungs every 4 (four) hours as needed for wheezing or shortness of breath. ) 8.5 g 1  . sertraline (ZOLOFT) 25 MG tablet Take 2 tablets (50 mg total) by mouth at bedtime. 60 tablet 5  .  SYMBICORT 160-4.5 MCG/ACT inhaler INHALE 2 PUFFS INTO THE LUNGS TWICE DAILY (Patient taking differently: Inhale 2 puffs into the lungs 2 (two) times daily. ) 10.2 g 5  . tamsulosin (FLOMAX) 0.4 MG CAPS capsule TAKE ONE CAPSULE BY MOUTH DAILY AFTER SUPPER 90 capsule 1  . VASCEPA 1 g CAPS TAKE TWO CAPSULES BY MOUTH TWICE DAILY 120 capsule 2  . XARELTO 20 MG TABS tablet TAKE ONE TABLET BY MOUTH DAILY WITH SUPPER (Patient taking differently: Take 20 mg by mouth daily with supper. ) 30 tablet 11  . isosorbide mononitrate (IMDUR) 30 MG 24 hr tablet Take 30 mg by mouth daily. TAKE 1 TABLET BY MOUTH ONCE DAILY.    [name] torsemide (DEMADEX) 20 MG tablet Take 20 mg every other day 60 tablet 6  . lipase/protease/amylase (CREON) 36000 UNITS CPEP capsule Take 2 capsules (72,000 Units total) by mouth 3 (three) times daily with meals. (Patient not taking: Reported on 01/11/2018) 780 capsule 3   No facility-administered medications prior to visit.     Allergies  Allergen Reactions  . Morphine And Related Other (See Comments)    Drenched with perspiration  . Entresto [Sacubitril-Valsartan]     hypotension  . Demerol [Meperidine] Nausea Only  . Starlix [Nateglinide] Other (See Comments)    gassy    ROS As per HPI  PE: BP initially was 108/44 Blood pressure (!) 108/44, pulse (!) 58, temperature 98.4 F (36.9 C), temperature source Oral, resp. rate 16, height _0  (1.803 m), weight 230 lb 8 oz (104.6 kg), SpO2 96 %.  Room air, resting. Gen: Alert, NAD.   Patient is oriented to person, place, time, and situation. AFFECT: pleasant, lucid thought and speech. JOA:CZYS: no injection, icteris, swelling, or exudate.  EOMI, PERRLA. Mouth:  lips without lesion/swelling.  Oral mucosa pink and moist. Oropharynx without erythema, exudate, or swelling.  CV: RRR, no m/r/g.   LUNGS: CTA bilat, nonlabored resps, good aeration in all lung fields. EXT: no clubbing or cyanosis.  Trace R LL pitting edema. 1+ L LL pitting edema.   LABS:    Chemistry      Component Value Date/Time   NA 142 01/11/2018 0919   K 4.4 01/11/2018 0919   CL 105 01/11/2018 0919   CO2 30 01/11/2018 0919   BUN 21 01/11/2018 0919   CREATININE 0.96 01/11/2018 0919   CREATININE 1.22 (H) 01/11/2017 1018      Component Value Date/Time   CALCIUM 8.7 01/11/2018 0919   ALKPHOS 63 09/30/2017 1237   AST 18 09/30/2017 1237   ALT 16 09/30/2017 1237   BILITOT 0.4 09/30/2017 1237     01/04/18 GFR 77 ml/min  IMPRESSION AND PLAN:  Chronic hypoxic respiratory failure (COPD + chronic combined syst/diast HF). I feel like volume is not the issue at this time.  No change in cardiac/chf meds. I think COPD exac is still ongoing but fortunately pretty mild. Continue oxygen to keep sat 88-92%.   Prednisone 40 mg qd x 3d, then 65m qd x 3d, then 10 mg qd x 3d. Will rx home neb machine and hopefully insurance will cover it. Albut neb sol'n eRx'd today. Check CXR today.  BMET today.  Spent 25 min with pt today, with >50% of this time spent in counseling and care coordination regarding the above problems.  An After Visit Summary was printed and given to the patient.  FOLLOW UP: Return in about 1 week (around 01/18/2018) for f/u copd/chf/hypox.  Signed:  PCrissie Sickles MD  01/14/2018

## 2018-01-12 NOTE — Progress Notes (Signed)
Advanced Heart Failure Clinic Note   Primary Care: Dr. Anitra Lauth Primary Cardiologist: Dr. [name]  HPI:  Joshua [name] is a 78 y.o. 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 Unable to tolerate Entresto due to lightheadedness/hypotension.   Admitted 5/31 -> 06/11/16 with worsening SOB, volume overload, and increased 02 demand. Echo showed EF down to 25-30%.  Diuresed on IV lasix and given solumedrol. Thought to be mixed AECOPD and acute CHF. Moved to stepdown 06/05/16 with worsening resp status and BiPAP requirement. Improved with nebs, steroids, ABX and IV lasix + metolazone. Sent home on Radium and prednisone taper. Overall diuresed 22 lbs. He had left and right heart cath with patent grafts and no interventional target.  Discharge weight 233 lbs.   Pt admitted from 6/11 -> 06/17/16 with syncope (unwitnessed) Noted to have fall x 3. EKG with LBBB. Creatinine mildly elevated. CT head negative for acute process. R hip xray negative for fracture or dislocated hardware.  Felt to be vasovagal. Received NS and diuretics held up to discharge.  Creatinine improved.  Admitted 09/30/17 with weakness and bradycardia. Had AKI and hyperkalemia. Bisoprolol, lisinorpil, digoxin, and spironolactone stopped. ECHO completed and showed EF 50-55% with grade IDD. Discharged on 09/30/2017 with weight 216 pounds.   Admitted 12/2017 with acute respiratory failure and hypoxia. He was diuresed with IV lasix, then transitioned back to home dose of torsemide 20 mg every other day. He was also treated for COPD exacerbation with prednisone and nebs. Discharged with new home O2. DC weight: 227 lbs.  He returns today for post hospital follow up. He was drinking a lot of fluid before he got hospitalized. He is now taking torsemide 20 mg daily. Overall doing okay. He is SOB when he "overexerts" himself, which is walking over 100 feet on flat ground. He also gets SOB with ADLs. He tells  me this has been his baseline for a long time. No orthopnea or PND. Wears O2 qHS. He gets BLE edema if he stands a lot throughout the day. No palpitations, CP, or bleeding. He has a productive cough with clear sputum. No fever or chills. Remains on prednisone taper for COPD. Weights 228 -> 224 lbs at home. Now limiting fluid intake. Wife limits salt intake. Taking all medications. He was taken off lokelma during admission.   Labs (8/18): K 4.7, creatinine 1.05 Labs (9/18): K 4.3, creatinine 1.44 Labs (10/18): K 4.9, creatinine 0.99 Labs (11/18): K 4.5, creatinine 1.32, digoxin 0.3 Labs (4/19): TGs 397, LDL 47, K 4.4, creatinine 1.01 Labs (10/15/2017): K 4.9 Creatinine 10.8    PMH:  1. CAD: s/p CABG in 8/15.  - LHC (6/18) with 80% distal LM, TO pLAD, TO ostial LCx, subtotal occlusion distal RCA.  LIMA-LAD patent, Y graft to OM and D patent (50% stenosis in graft at touchdown on OM), patent SVG-PDA.  2. Chronic systolic CHF: Ischemic cardiomyopathy.  Echo (6/18) with EF 25-30%, diffuse HK, moderate MR, mildly dilated RV with mildly decreased systolic function, PASP 48 mmHg.  - RHC (6/18): mean RA 10, PA 45/26, mean PCWP 21, CI 2.11 (Fick) and 2.41 (thermo), PVR 2.9 WU.  - Echo (9/18): EF 40-45%, septal-lateral dyssynchrony, diffuse hypokinesis, normal RV size and systolic function.  3. COPD: Prior smoker.   4. Hyperlipidemia 5. Atrial fibrillation: Paroxysmal.  6. Type II diabetes 7. HTN 8. Anemia: h/o Fe deficiency 9. 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  10. H/o pericardial effusion with tamponade in 6/15.  11. HTN 12. LBBB  Review of systems complete and found to be negative unless listed in HPI.   Current Outpatient Medications  Medication Sig Dispense Refill  . ACCU-CHEK GUIDE test strip USE TO CHECK BLOOD SUGAR 3 TIMES DAILY 100 each 11  . acetaminophen (TYLENOL) 325 MG tablet Take 2 tablets (650 mg total) by mouth every 4 (four) hours as  needed for headache or mild pain.    [name] albuterol (PROVENTIL) (2.5 MG/3ML) 0.083% nebulizer solution Take 3 mLs (2.5 mg total) by nebulization every 6 (six) hours as needed for wheezing or shortness of breath. 75 mL 0  . Ascorbic Acid (VITAMIN C) 1000 MG tablet Take 1,000 mg by mouth daily.    [name] atorvastatin (LIPITOR) 40 MG tablet TAKE ONE TABLET BY MOUTH DAILY 90 tablet 0  . bisoprolol (ZEBETA) 5 MG tablet Take 1 tablet (5 mg total) by mouth daily. 30 tablet 11  . Cholecalciferol (VITAMIN D-3) 1000 units CAPS Take 1,000 Units by mouth daily. Reported on 02/26/2015    . Empagliflozin-metFORMIN HCl (SYNJARDY) 05-998 MG TABS Take 1 tablet by mouth daily.    . fluticasone (FLONASE) 50 MCG/ACT nasal spray Place 2 sprays into both nostrils daily. (Patient taking differently: Place 2 sprays into both nostrils as needed. ) 16 g 1  . gabapentin (NEURONTIN) 100 MG capsule TAKE TWO CAPSULES BY MOUTH THREE TIMES DAILY (Patient taking differently: Take 300 mg by mouth 2 (two) times daily. ) 180 capsule 11  . insulin regular human CONCENTRATED (HUMULIN R) 500 UNIT/ML injection Inject 0.67m under the skin 3 times daily. (Patient taking differently: Inject 42.5-50 Units into the skin 3 (three) times daily with meals. Pt is drawing syringe up to 0.155m(50 units) in the morning and afternoon, and 0.085 ml (42.5 units) at bedtime.) 20 mL 0  . ipratropium (ATROVENT) 0.03 % nasal spray Place 2 sprays into both nostrils 2 (two) times daily. 30 mL 6  . isosorbide mononitrate (IMDUR) 30 MG 24 hr tablet Take 30 mg by mouth daily. TAKE 1 TABLET BY MOUTH ONCE DAILY.    . mirtazapine (REMERON) 45 MG tablet Take 1 tablet (45 mg total) by mouth at bedtime. 30 tablet 5  . Multiple Vitamin (MULTIVITAMIN WITH MINERALS) TABS Take 1 tablet by mouth daily.    . nitroGLYCERIN (NITROSTAT) 0.4 MG SL tablet PLACE 1 TABLET UNDER THE TONGUE EVERY 5 MINUTES AS NEEDED FOR CHEST PAIN (Patient taking differently: Place 0.4 mg under the tongue every  5 (five) minutes as needed for chest pain. ) 25 tablet 3  . predniSONE (DELTASONE) 10 MG tablet 4 tabs po qd x 3d, then 2 tabs po qd x 3d, then 1 tab po qd x 3d 21 tablet 0  . PROAIR HFA 108 (90 Base) MCG/ACT inhaler INHALE 2 PUFFS INTO THE LUNGS EVERY 4 HOURS AS NEEDED FOR WHEEZING ORSHORTNESS OF BREATH (Patient taking differently: Inhale 2 puffs into the lungs every 4 (four) hours as needed for wheezing or shortness of breath. ) 8.5 g 1  . sertraline (ZOLOFT) 25 MG tablet Take 2 tablets (50 mg total) by mouth at bedtime. 60 tablet 5  . SYMBICORT 160-4.5 MCG/ACT inhaler INHALE 2 PUFFS INTO THE LUNGS TWICE DAILY (Patient taking differently: Inhale 2 puffs into the lungs 2 (two) times daily. ) 10.2 g 5  . tamsulosin (FLOMAX) 0.4 MG CAPS capsule TAKE ONE CAPSULE BY MOUTH DAILY AFTER SUPPER 90 capsule 1  .  torsemide (DEMADEX) 20 MG tablet Take 20 mg by mouth daily.    [name] VASCEPA 1 g CAPS TAKE TWO CAPSULES BY MOUTH TWICE DAILY 120 capsule 2  . XARELTO 20 MG TABS tablet TAKE ONE TABLET BY MOUTH DAILY WITH SUPPER (Patient taking differently: Take 20 mg by mouth daily with supper. ) 30 tablet 11   No current facility-administered medications for this encounter.     Allergies  Allergen Reactions  . Morphine And Related Other (See Comments)    Drenched with perspiration  . Entresto [Sacubitril-Valsartan]     hypotension  . Demerol [Meperidine] Nausea Only  . Starlix [Nateglinide] Other (See Comments)    gassy      Social History   Socioeconomic History  . Marital status: Married    Spouse name: [name]  . Number of children: 4  . Years of education: 10  . Highest education level: Not on file  Occupational History  . Occupation: retired, former Scientist, forensic  Social Needs  . Financial resource strain: Not hard at all  . Food insecurity:    Worry: Never true    Inability: Never true  . Transportation needs:    Medical: No    Non-medical: No  Tobacco Use  . Smoking status: Current Every  Day Smoker    Packs/day: 1.00    Years: 64.00    Pack years: 64.00    Types: Cigarettes    Last attempt to quit: 10/23/2015    Years since quitting: 2.2  . Smokeless tobacco: Former Systems developer    Quit date: 06/30/2013  Substance and Sexual Activity  . Alcohol use: No    Alcohol/week: 0.0 standard drinks  . Drug use: No  . Sexual activity: Never  Lifestyle  . Physical activity:    Days per week: Not on file    Minutes per session: Not on file  . Stress: Not on file  Relationships  . Social connections:    Talks on phone: Not on file    Gets together: Not on file    Attends religious service: Not on file    Active member of club or organization: Not on file    Attends meetings of clubs or organizations: Not on file    Relationship status: Not on file  . Intimate partner violence:    Fear of current or ex partner: Not on file    Emotionally abused: Not on file    Physically abused: Not on file    Forced sexual activity: Not on file  Other Topics Concern  . Not on file  Social History Narrative   Married, 4 children.   Formerly a Medical illustrator.   Level of education: HS.     +tobacco--lifelong/ quit 2015).  No alcohol or drugs.   No exercise.  +excessive caffeine.      Lives in 1 story home with his wife and her sister      Family History  Problem Relation Age of Onset  . Heart disease Father   . Heart attack Father   . CVA Mother   . Hypertension Mother   . Diabetes Paternal Grandmother   . Breast cancer Sister   . Diabetes Maternal Uncle   . Heart disease Maternal Uncle   . Stroke Neg Hx     Vitals:   01/13/18 1010  BP: 130/62  Pulse: 64  SpO2: 93%  Weight: 103.8 kg (228 lb 12.8 oz)   Wt Readings from Last 3 Encounters:  01/13/18  103.8 kg (228 lb 12.8 oz)  01/11/18 104.6 kg (230 lb 8 oz)  01/04/18 103.9 kg (229 lb 2 oz)     PHYSICAL EXAM: General: Elderly. No resp difficulty. Walked into clinic with walker.  HEENT: Normal Neck:  Supple. JVP ~7. Carotids 2+ bilat; no bruits. No thyromegaly or nodule noted. Cor: PMI nondisplaced. RRR, No M/G/R noted Lungs: decreased lower lobes.  Abdomen: Soft, non-tender, non-distended, no HSM. No bruits or masses. +BS  Extremities: No cyanosis, clubbing, or rash. R and LLE no edema.  Neuro: Alert & orientedx3, cranial nerves grossly intact. moves all 4 extremities w/o difficulty. Affect pleasant   ASSESSMENT & PLAN: 1. Combined Diastolic/Systolic CHF: Ischemic cardiomyopathy.  ECHO 09/2017 EF 50-55% with Grade I DD.  - Chronic NYHA III. Volume okay on exam.  - Continue torsemide 20 mg daily. Discussed sliding scale diuretics today. He can take an extra 20 mg PRN. Reviewed labs from earlier this week. Creatinine 0.96, K 4.4 on 1/7. - Continue 5 mg bisoprolol daily.  - No arb/spiro with hyperkalemia. 2. COPD: No longer smoking. Wears O2 qHS.  3. CAD s/p CABG: Cath 06/10/16 with severe 3 vessel native disease with patent bypass grafts. No interventional target.  - Continue statin.  With stable CAD and Xarelto use, does not need to be on ASA.  - Continue BB  - No s/s ischemia.  4. Atrial fibrillation: Paroxysmal.  - Regular on exam.  - Continue Xarelto. Denies bleeding.  5. Type II diabetes:  - Per PCP. Consider SGLT2I 6. Hyperkalemia - Labs reviewed from Monday. K 4.4. - He is no longer taking lokelma daily. Has at home if he needs it.  7. HTN - Continue bisoprolol.   Follow up 4-6 weeks with APP Push follow up on 1/27 with Dr [name] to 3 months.   Georgiana Shore NP-C  10:14 AM  Greater than 50% of the 25 minute visit was spent in counseling/coordination of care regarding disease state education, salt/fluid restriction, sliding scale diuretics, and medication compliance.

## 2018-01-13 ENCOUNTER — Ambulatory Visit (HOSPITAL_COMMUNITY)
Admission: RE | Admit: 2018-01-13 | Discharge: 2018-01-13 | Disposition: A | Discharge status: Home / Self Care | Payer: Medicare Other | Source: Ambulatory Visit | Attending: Internal Medicine | Admitting: Internal Medicine

## 2018-01-13 ENCOUNTER — Other Ambulatory Visit: Payer: Self-pay | Admitting: Family Medicine

## 2018-01-13 ENCOUNTER — Other Ambulatory Visit: Payer: Self-pay

## 2018-01-13 ENCOUNTER — Encounter (HOSPITAL_COMMUNITY): Payer: Self-pay

## 2018-01-13 VITALS — BP 130/62 | HR 64 | Wt 228.8 lb

## 2018-01-13 DIAGNOSIS — I480 Paroxysmal atrial fibrillation: Secondary | ICD-10-CM | POA: Diagnosis not present

## 2018-01-13 DIAGNOSIS — I447 Left bundle-branch block, unspecified: Secondary | ICD-10-CM | POA: Insufficient documentation

## 2018-01-13 DIAGNOSIS — I110 Hypertensive heart disease with heart failure: Secondary | ICD-10-CM | POA: Diagnosis not present

## 2018-01-13 DIAGNOSIS — E785 Hyperlipidemia, unspecified: Secondary | ICD-10-CM | POA: Diagnosis not present

## 2018-01-13 DIAGNOSIS — E119 Type 2 diabetes mellitus without complications: Secondary | ICD-10-CM | POA: Insufficient documentation

## 2018-01-13 DIAGNOSIS — J441 Chronic obstructive pulmonary disease with (acute) exacerbation: Secondary | ICD-10-CM | POA: Diagnosis not present

## 2018-01-13 DIAGNOSIS — Z8249 Family history of ischemic heart disease and other diseases of the circulatory system: Secondary | ICD-10-CM | POA: Insufficient documentation

## 2018-01-13 DIAGNOSIS — I5032 Chronic diastolic (congestive) heart failure: Secondary | ICD-10-CM | POA: Diagnosis not present

## 2018-01-13 DIAGNOSIS — Z794 Long term (current) use of insulin: Secondary | ICD-10-CM | POA: Diagnosis not present

## 2018-01-13 DIAGNOSIS — Z7952 Long term (current) use of systemic steroids: Secondary | ICD-10-CM | POA: Insufficient documentation

## 2018-01-13 DIAGNOSIS — I5040 Unspecified combined systolic (congestive) and diastolic (congestive) heart failure: Secondary | ICD-10-CM | POA: Diagnosis not present

## 2018-01-13 DIAGNOSIS — E875 Hyperkalemia: Secondary | ICD-10-CM | POA: Diagnosis not present

## 2018-01-13 DIAGNOSIS — I255 Ischemic cardiomyopathy: Secondary | ICD-10-CM | POA: Diagnosis not present

## 2018-01-13 DIAGNOSIS — Z7901 Long term (current) use of anticoagulants: Secondary | ICD-10-CM | POA: Diagnosis not present

## 2018-01-13 DIAGNOSIS — Z79899 Other long term (current) drug therapy: Secondary | ICD-10-CM | POA: Diagnosis not present

## 2018-01-13 DIAGNOSIS — F17210 Nicotine dependence, cigarettes, uncomplicated: Secondary | ICD-10-CM | POA: Insufficient documentation

## 2018-01-13 DIAGNOSIS — Z885 Allergy status to narcotic agent status: Secondary | ICD-10-CM | POA: Diagnosis not present

## 2018-01-13 DIAGNOSIS — Z951 Presence of aortocoronary bypass graft: Secondary | ICD-10-CM | POA: Diagnosis not present

## 2018-01-13 DIAGNOSIS — I10 Essential (primary) hypertension: Secondary | ICD-10-CM | POA: Diagnosis not present

## 2018-01-13 DIAGNOSIS — Z888 Allergy status to other drugs, medicaments and biological substances status: Secondary | ICD-10-CM | POA: Insufficient documentation

## 2018-01-13 DIAGNOSIS — I2510 Atherosclerotic heart disease of native coronary artery without angina pectoris: Secondary | ICD-10-CM | POA: Diagnosis not present

## 2018-01-13 NOTE — Patient Instructions (Signed)
Follow up with the Advanced Practice Providers in 4-6 weeks  Follow up with Dr. [name] in 3 months

## 2018-01-14 NOTE — Telephone Encounter (Signed)
RF request for isosorbide. Looks like we stopped this medication then it was put back on his list. Just want to make sure pt is to be taking this medication. Please advise. Thanks.

## 2018-01-18 ENCOUNTER — Ambulatory Visit (INDEPENDENT_AMBULATORY_CARE_PROVIDER_SITE_OTHER): Payer: Medicare Other | Admitting: Family Medicine

## 2018-01-18 ENCOUNTER — Ambulatory Visit: Payer: Self-pay | Admitting: Endocrinology

## 2018-01-18 ENCOUNTER — Encounter: Payer: Self-pay | Admitting: Family Medicine

## 2018-01-18 VITALS — BP 135/79 | HR 66 | Temp 98.8°F | Resp 16 | Ht 71.0 in | Wt 226.2 lb

## 2018-01-18 DIAGNOSIS — I255 Ischemic cardiomyopathy: Secondary | ICD-10-CM | POA: Diagnosis not present

## 2018-01-18 DIAGNOSIS — I480 Paroxysmal atrial fibrillation: Secondary | ICD-10-CM | POA: Diagnosis not present

## 2018-01-18 DIAGNOSIS — J449 Chronic obstructive pulmonary disease, unspecified: Secondary | ICD-10-CM | POA: Diagnosis not present

## 2018-01-18 DIAGNOSIS — J441 Chronic obstructive pulmonary disease with (acute) exacerbation: Secondary | ICD-10-CM | POA: Diagnosis not present

## 2018-01-18 DIAGNOSIS — J9611 Chronic respiratory failure with hypoxia: Secondary | ICD-10-CM

## 2018-01-18 DIAGNOSIS — I5042 Chronic combined systolic (congestive) and diastolic (congestive) heart failure: Secondary | ICD-10-CM

## 2018-01-18 NOTE — Progress Notes (Signed)
OFFICE VISIT  01/18/2018   CC:  Chief Complaint  Patient presents with  . Follow-up    CHF, COPD, Hypoxia      HPI:    Patient is a 78 y.o. Caucasian male who presents for 7 day f/u chronic hypoxic respiratory failure. Plan as of last visit: Continue oxygen to keep sat 88-92%.   Prednisone 40 mg qd x 3d, then 43m qd x 3d, then 10 mg qd x 3d. Rx'd home neb machine to see if insurance will cover it. Albut neb sol'n eRx'd today. BMET at that visit was normal. CXR showed: IMPRESSION: 1. Stable cardiomegaly. No present CHF, pneumonia, or pleural effusion. 2. Pleural plaques again are noted right greater than left.  He saw CHF cardiologist on 01/13/2018 and they discussed using an extra dose of torsemide prn.  No new labs. They want him to see an APP in their office in 4-6 wks, and f/u with Dr. MAundra Dubinin 3 months.  Interim hx: He got neb machine, uses albut typically bid. He does feel like this is more effective than his inhaler. He feels like his cough is improved, has less phlegm, stage III NYHA CHF-wise (baseline). He has his last dose of prednisone due tonight.  Wears oxygen when during sleep and prn daytime when he walks and feels like he needs it.    His glucoses went up a little but still respectable.  Highest fasting gluc 203, highest hs was 232.    ROS: no palpitations, no dizziness, no heart racing.  No CP.  No LE swelling. No melena or hematochezia.   Past Medical History:  Diagnosis Date  . Acute respiratory distress 06/04/2016  . Adenomatous colon polyp 10/16/2011   Repeat 2018  . Arthritis     Hips, R>L & KNEES  . CAD, multiple vessel    3V CAD cath 08/08/13----CABG done shortly after.  Cath 06/2016: clear bipasses/no interventional lesion.  . 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 pylori neg, no dysplasia or malignancy.  . Chronic combined systolic and diastolic CHF, NYHA class 2 ([place] 2016-[place]   [facility].  Transesoph Echo 09/2016 EF improved to 40-45%; diffuse hypokinesis, Grd I DD.  09/2017 EF 50-55%,hypokin basal inferolateral wall, grd I DD,mild MR.  . Chronic hypoxemic respiratory failure ([place]    HISTORY OF(2018): oxygen testing normal in office 11/10/17, no home oxygen was not recertified.  . Chronic renal insufficiency, stage 3 (moderate) ([place] 2018   GFR 50s  . COPD (chronic obstructive pulmonary disease) ([place]    GOLD II.  Spirometry  2004 borderline obstruction; 2015 mod obst: noncompliant with bid symbicort so pulmonologist switched him to a once daily inhaler: Trelegy ellipta 12/2015.  Oxygen prn: goal 88-92%.  . Diabetic nephropathy ([place]    Elevated urine microalb/cr 03/2011  . DM type 2 (diabetes mellitus, type 2) ([place]    Poor control on max oral meds--pt eventually agreed to insulin therapy.  As of 2017 his DM is being managed by Dr. KDwyane Deein endo.  On insulin pump as of 04/2017 endo f/u.  .Marland KitchenDOE (dyspnea on exertion)    COPD + chronic diastolic HF  . Dysthymia    Worsened after 2015 heart surgery.  Memory loss +? as well.  Neuro eval 01/2017--plan is to get MRI brain, B12 level, and neuropsychiatric testing.  . Erectile dysfunction    Normal testosterone  . Hearing loss of both ears 2016   Hearing aids  .  Hyperplastic colon polyp 2001   No further colonoscopies to be done as of 11/2016 GI eval.  . Hypertension   . IBS (irritable bowel syndrome)    responds relatively well to prn levsin.  Pepto bismol q AM trial by Dr. Loletha Carrow 08/2017, also checking fecal elastace and lactoferrin.  . Iron deficiency anemia 2014   03/2012 capsule endoscopy showed 2 AVMs--likely responsible for his IDA--lifetime iron supp recommended + q58moCBCs.  . Ischemic cardiomyopathy 06/2016; 09/2016   06/2016: EF 25-30%, global hypokinesis, grd II DD, vent septum motion changes c/w LBBB, mod MV regurg, L atr mod/sev dilat, R atr mod dilat, mod incr pulm press, Grd II DD.  09/2016--EF 40-45%; diffuse  hypokinesis, Grd I DD.  . Macular degeneration, dry    Mild, bilat (Optometrist, DMayford Knifeat MKinder [employer] Energyof NAudubonin [place]  . Mild cognitive impairment    MCI, likley vascular etiology, + MDD, recurrent-->neuropsych testing 06/2017.  Neurol to repeat testing 1 yr  . Mixed hyperlipidemia    statin and vascepa as of 04/2017  . Nocturnal hypoxemia   . Obesity   . Open toe wound 11/2015   Wound care clinic appt made and then canceled when toe improved.  . Other and unspecified angina pectoris   . PAD (peripheral artery disease) ([place] 02/2016   Abnormal ABI's and waveforms: LE arterial duplex ordered for f/u as per cardiologist's recommendation.  Vasc eval by Dr. CBridgett Larsson impression was minimal PAD, recommended maximize med mgmt.  .Marland KitchenPAF (paroxysmal atrial fibrillation) ([place] 10/2014   xarelto  . Pericardial effusion with cardiac tamponade 6//29/15   pericardiocentesis was done,  Infectious/inflamm (cytology showed NO MALIGNANT CELLS)  . Pleural plaque    Pleural plaques/asbestosis changes on CT chest done by pulm 05/2016.  .Marland KitchenPneumonia   . Tobacco dependence in remission    100+ pack-yr hx: quit after CABG    Past Surgical History:  Procedure Laterality Date  . APPENDECTOMY  1957  . CARDIAC CATHETERIZATION  08/08/2013  . CATARACT EXTRACTION W/ INTRAOCULAR LENS  IMPLANT, BILATERAL  04/08/2006 & 04/22/2006  . CATARACT EXTRACTION W/ INTRAOCULAR LENS  IMPLANT, BILATERAL Bilateral   . CHOLECYSTECTOMY OPEN  1999  . COLONOSCOPY  10/16/2011   Procedure: COLONOSCOPY;  Surgeon: RInda Castle MD;  Location: [facility];  Service: Endoscopy;  Laterality: N/A;  . CORONARY ARTERY BYPASS GRAFT N/A 08/14/2013   Procedure: CORONARY ARTERY BYPASS GRAFTING (CABG);  Surgeon: SMelrose Nakayama MD;  [place] [facility]  Service: Open Heart Surgery;  Laterality: N/A;  Times 4   using left internal mammary artery and endoscopically harvested bilateral saphenous vein  . ESOPHAGOGASTRODUODENOSCOPY   02/25/12   Atrophic gastritis with a few erosions--capsule endoscopy planned as of 02/25/12 (Dr. KDeatra Ina.  .Marland KitchenHARDWARE REMOVAL Right 08/12/2012   Procedure: HARDWARE REMOVAL;  Surgeon: CMcarthur Rossetti MD;  [facility]: [facility];  Service: Orthopedics;  Laterality: Right;  . HIP SURGERY Right 1954   Repair of slipped capital femoral epiphysis.  . INTRAOPERATIVE TRANSESOPHAGEAL ECHOCARDIOGRAM N/A 08/14/2013   Normal LV function. Procedure: INTRAOPERATIVE TRANSESOPHAGEAL ECHOCARDIOGRAM;  Surgeon: SMelrose Nakayama MD;  [place]  Service: Open Heart Surgery;  Laterality: N/A;  . LEFT HEART CATHETERIZATION WITH CORONARY ANGIOGRAM N/A 08/08/2013   Procedure: LEFT HEART CATHETERIZATION WITH CORONARY ANGIOGRAM;  Surgeon: JJettie Booze MD;  [facility];  Service: Cardiovascular;  Laterality: N/A;  . PATELLA FRACTURE SURGERY Left ~ 1979   bolt + 3 screws to repair  tib plateau fx  . PERICARDIAL TAP N/A 07/01/2013   Procedure: PERICARDIAL TAP;  Surgeon: Jettie Booze, MD;  Location[facility] CATH LAB;  Service: Cardiovascular;  Laterality: N/A;  . RIGHT/LEFT HEART CATH AND CORONARY ANGIOGRAPHY N/A 06/10/2016   Procedure: Right/Left Heart Cath and Coronary Angiography;  Surgeon: Larey Dresser, MD;  Location: [place] CV LAB;  Service: Cardiovascular;  Laterality: N/A;  . TESTICLE SURGERY  as a child   Undescended testicle brought down into scrotum  . TONSILLECTOMY  1947  . TOTAL HIP ARTHROPLASTY Right 08/12/2012   Procedure: REMOVAL OF OLD PINS RIGHT HIP AND RIGHT TOTAL HIP ARTHROPLASTY ANTERIOR APPROACH;  Surgeon: Mcarthur Rossetti, MD;  [facility];  Service: Orthopedics;  Laterality: Right;  . TRANSESOPHAGEAL ECHOCARDIOGRAM  09/22/2016   EF 40-45%; diffuse hypokinesis, Grd I DD.  [name] TRANSTHORACIC ECHOCARDIOGRAM  10/30/14   Mod LVH, EF 60-65%, normal wall motion, mod mitral regurg, mild PAH  . TRANSTHORACIC ECHOCARDIOGRAM  06/2016; 10/01/17   06/2016: EF 25-30%, global  hypokinesis, grd II DD, vent septum motion changes c/w LBBB, mod MV regurg, L atr mod/sev dilat, R atr mod dilat, mod incr pulm press, Grd II DD.  09/2017: EF 50-55%, Hypokinesis of the basal inferolateral wall, grd I DD, mild MR, +LAE.    Outpatient Medications Prior to Visit  Medication Sig Dispense Refill  . ACCU-CHEK GUIDE test strip USE TO CHECK BLOOD SUGAR 3 TIMES DAILY 100 each 11  . acetaminophen (TYLENOL) 325 MG tablet Take 2 tablets (650 mg total) by mouth every 4 (four) hours as needed for headache or mild pain.    [name] albuterol (PROVENTIL) (2.5 MG/3ML) 0.083% nebulizer solution Take 3 mLs (2.5 mg total) by nebulization every 6 (six) hours as needed for wheezing or shortness of breath. 75 mL 0  . Ascorbic Acid (VITAMIN C) 1000 MG tablet Take 1,000 mg by mouth daily.    [name] atorvastatin (LIPITOR) 40 MG tablet TAKE ONE TABLET BY MOUTH DAILY 90 tablet 0  . bisoprolol (ZEBETA) 5 MG tablet Take 1 tablet (5 mg total) by mouth daily. 30 tablet 11  . Cholecalciferol (VITAMIN D-3) 1000 units CAPS Take 1,000 Units by mouth daily. Reported on 02/26/2015    . Empagliflozin-metFORMIN HCl (SYNJARDY) 05-998 MG TABS Take 1 tablet by mouth daily.    . fluticasone (FLONASE) 50 MCG/ACT nasal spray Place 2 sprays into both nostrils daily. (Patient taking differently: Place 2 sprays into both nostrils as needed. ) 16 g 1  . gabapentin (NEURONTIN) 100 MG capsule TAKE TWO CAPSULES BY MOUTH THREE TIMES DAILY (Patient taking differently: Take 300 mg by mouth 2 (two) times daily. ) 180 capsule 11  . insulin regular human CONCENTRATED (HUMULIN R) 500 UNIT/ML injection Inject 0.71m under the skin 3 times daily. (Patient taking differently: Inject 42.5-50 Units into the skin 3 (three) times daily with meals. Pt is drawing syringe up to 0.180m(50 units) in the morning and afternoon, and 0.085 ml (42.5 units) at bedtime.) 20 mL 0  . ipratropium (ATROVENT) 0.03 % nasal spray Place 2 sprays into both nostrils 2 (two) times  daily. 30 mL 6  . isosorbide mononitrate (IMDUR) 30 MG 24 hr tablet TAKE ONE TABLET BY MOUTH DAILY 90 tablet 1  . mirtazapine (REMERON) 45 MG tablet Take 1 tablet (45 mg total) by mouth at bedtime. 30 tablet 5  . Multiple Vitamin (MULTIVITAMIN WITH MINERALS) TABS Take 1 tablet by mouth daily.    . nitroGLYCERIN (NITROSTAT) 0.4 MG  SL tablet PLACE 1 TABLET UNDER THE TONGUE EVERY 5 MINUTES AS NEEDED FOR CHEST PAIN (Patient taking differently: Place 0.4 mg under the tongue every 5 (five) minutes as needed for chest pain. ) 25 tablet 3  . predniSONE (DELTASONE) 10 MG tablet 4 tabs po qd x 3d, then 2 tabs po qd x 3d, then 1 tab po qd x 3d 21 tablet 0  . PROAIR HFA 108 (90 Base) MCG/ACT inhaler INHALE 2 PUFFS INTO THE LUNGS EVERY 4 HOURS AS NEEDED FOR WHEEZING ORSHORTNESS OF BREATH (Patient taking differently: Inhale 2 puffs into the lungs every 4 (four) hours as needed for wheezing or shortness of breath. ) 8.5 g 1  . sertraline (ZOLOFT) 25 MG tablet Take 2 tablets (50 mg total) by mouth at bedtime. 60 tablet 5  . SYMBICORT 160-4.5 MCG/ACT inhaler INHALE 2 PUFFS INTO THE LUNGS TWICE DAILY (Patient taking differently: Inhale 2 puffs into the lungs 2 (two) times daily. ) 10.2 g 5  . tamsulosin (FLOMAX) 0.4 MG CAPS capsule TAKE ONE CAPSULE BY MOUTH DAILY AFTER SUPPER 90 capsule 1  . torsemide (DEMADEX) 20 MG tablet Take 20 mg by mouth daily.    [name] VASCEPA 1 g CAPS TAKE TWO CAPSULES BY MOUTH TWICE DAILY 120 capsule 2  . XARELTO 20 MG TABS tablet TAKE ONE TABLET BY MOUTH DAILY WITH SUPPER (Patient taking differently: Take 20 mg by mouth daily with supper. ) 30 tablet 11   No facility-administered medications prior to visit.     Allergies  Allergen Reactions  . Morphine And Related Other (See Comments)    Drenched with perspiration  . Entresto [Sacubitril-Valsartan]     hypotension  . Demerol [Meperidine] Nausea Only  . Starlix [Nateglinide] Other (See Comments)    gassy    ROS As per  HPI  PE: Blood pressure 135/79, pulse 66, temperature 98.8 F (37.1 C), temperature source Oral, resp. rate 16, height _0  (1.803 m), weight 226 lb 4 oz (102.6 kg), SpO2 94 %. Gen: Alert, tired but well appearing.  Patient is oriented to person, place, time, and situation. VPX:TGGY: no injection, icteris, swelling, or exudate.  EOMI, PERRLA. Mouth: lips without lesion/swelling.  Oral mucosa pink and moist. Oropharynx without erythema, exudate, or swelling.  CV: RRR, no m/r/g.   LUNGS: CTA bilat, nonlabored resps, good aeration in all lung fields. EXT: no clubbing or cyanosis.  1+ pitting edema bilat     LABS:    Chemistry      Component Value Date/Time   NA 142 01/11/2018 0919   K 4.4 01/11/2018 0919   CL 105 01/11/2018 0919   CO2 30 01/11/2018 0919   BUN 21 01/11/2018 0919   CREATININE 0.96 01/11/2018 0919   CREATININE 1.22 (H) 01/11/2017 1018      Component Value Date/Time   CALCIUM 8.7 01/11/2018 0919   ALKPHOS 63 09/30/2017 1237   AST 18 09/30/2017 1237   ALT 16 09/30/2017 1237   BILITOT 0.4 09/30/2017 1237      IMPRESSION AND PLAN:  1) Chronic hypoxic RF (COPD + chronic combined syst/diast HF). Recent COPD exac: he is back to baseline now.  Doing better with nebs vs HFA. Will see if we can get him a portable concentrator for his oxygen. He appears euvolemic.  Continue torsemide 59m qd, with option to take an extra dose prn. Last BMET showed stable/normal lytes and kidney function. His PAF is stable--regular today and asymptomatic as well.  Continue xarelto and bisoprolol.  He will f/u with CHF clinic in 6-8 wks and again in 3 mo.  An After Visit Summary was printed and given to the patient.  FOLLOW UP: Return in about 2 months (around 03/19/2018) for routine chronic illness f/u.  Signed:  Crissie Sickles, MD           01/18/2018

## 2018-01-26 ENCOUNTER — Encounter: Payer: Self-pay | Admitting: Endocrinology

## 2018-01-26 ENCOUNTER — Ambulatory Visit: Payer: Medicare Other | Admitting: Endocrinology

## 2018-01-26 VITALS — BP 122/78 | HR 72 | Ht 71.0 in | Wt 230.2 lb

## 2018-01-26 DIAGNOSIS — E1165 Type 2 diabetes mellitus with hyperglycemia: Secondary | ICD-10-CM

## 2018-01-26 DIAGNOSIS — Z794 Long term (current) use of insulin: Secondary | ICD-10-CM | POA: Diagnosis not present

## 2018-01-26 NOTE — Progress Notes (Signed)
Patient ID: Joshua [name], male   DOB: 04/01/1940, 78 y.o.   MRN: [id number]            Reason for Appointment:  Follow-up for Type 2 Diabetes  Referring physician: [place]   History of Present Illness:          Date of diagnosis of type 2 diabetes mellitus: ?  2002        Background history:   He apparently had been treated with metformin initially which was continued until about 2016. He previously has also been tried on Amaryl, Actos, Farxiga and Victoza but he claims that these did not help his blood sugar and were stopped Appears to have been on insulin since about 2014 but he does not remember details. He has been taking mostly Levemir insulin as a basal insulin but was also on Lantus in 2015 for some time His best A1c has been 7.3, once in 2014 and another time in 2016, otherwise his A1c has been higher and as high as 10.8  Recent history:   INSULIN regimen U-500 insulin 3 times a day with syringe, doses 10--10--8.5  Non-insulin hypoglycemic drugs the patient is taking are: Synjardy 05/998 twice a day breakfast and suppertime  His A1c is recently 6.8 compared to 9% previously   Current management, blood sugar patterns and problems identified:  He did bring his Accu-Chek monitor for download  His blood sugars were higher in December when he was getting prednisone after his hospitalization  However he has gone back to his original dose of insulin after stopping the prednisone  Also was restarted back on Synjardy which had been stopped in the hospital  He did have a few more low sugars early morning his last visit but has had only 1 documented low blood sugar of 62 waking up recently  Has sporadic low sugars midday, suppertime and bedtime probably more often after evening meal than any other time  HYPERGLYCEMIA may be related to either forgetting his insulin before meals, taking it late after eating more variability in his diet  Most of his HIGH blood sugars appear to  be in the early afternoon and sporadically late afternoon and evening  Hypoglycemic episodes have been less  FASTING readings are the lowest of the day  He is asking about the freestyle libre system but is currently checking his blood sugars only inconsistently 4 times a day and not every day    Side effects from medications have been: None  Compliance with the medical regimen: Fair   Glucose monitoring:  done about 3 times a day         Glucometer:  Accu-Chek .      Blood Glucose readings by download:    PRE-MEAL Fasting Lunch Dinner Bedtime Overall  Glucose range:  62-164      Mean/median:  93  186  147  80  137+/-82   POST-MEAL PC Breakfast PC Lunch PC Dinner  Glucose range:   50-235  46-221  Mean/median:  133  168  214    PREVIOUS reading  PRE-MEAL Fasting Lunch Dinner  overnight Overall  Glucose range:  52-185    48-157   Mean/median:  90   187  94    POST-MEAL PC Breakfast PC Lunch PC Dinner  Glucose range:    104-326  Mean/median:  152  212      Self-care: The diet that the patient has been following is: tries to limit Sweet drinks .  Meal times are:  Breakfast is at 5-7 a.m.  Dinner: 5-6 PM    Typical meal intake: Breakfast is usually eggs,  Sausage, oatmeal or grits.  Lunch is a sandwich, any evening he has meat and vegetables, For snacks he will have fruit or crackers At       [place] visit, most recent: 01/09/16 CDE visit: 12/18               Exercise: Unable to do any     Weight history:  Wt Readings from Last 3 Encounters:  01/26/18 230 lb 3.2 oz (104.4 kg)  01/18/18 226 lb 4 oz (102.6 kg)  01/13/18 228 lb 12.8 oz (103.8 kg)    Glycemic control:   Lab Results  Component Value Date   HGBA1C 6.8 (H) 12/24/2017   HGBA1C 9.0 (H) 10/01/2017   HGBA1C 8.3 (A) 09/29/2017   Lab Results  Component Value Date   MICROALBUR 1.4 07/02/2017   LDLCALC 42 12/16/2017   CREATININE 0.96 01/11/2018   Lab Results  Component Value Date   MICRALBCREAT  12.2 07/02/2017    Other active problems: See review of systems   Allergies as of 01/26/2018      Reactions   Morphine And Related Other (See Comments)   Drenched with perspiration   Entresto [sacubitril-valsartan]    hypotension   Demerol [meperidine] Nausea Only   Starlix [nateglinide] Other (See Comments)   gassy      Medication List       Accurate as of January 26, 2018  9:37 AM. Always use your most recent med list.        ACCU-CHEK GUIDE test strip Generic drug:  glucose blood USE TO CHECK BLOOD SUGAR 3 TIMES DAILY   acetaminophen 325 MG tablet Commonly known as:  TYLENOL Take 2 tablets (650 mg total) by mouth every 4 (four) hours as needed for headache or mild pain.   atorvastatin 40 MG tablet Commonly known as:  LIPITOR TAKE ONE TABLET BY MOUTH DAILY   bisoprolol 5 MG tablet Commonly known as:  ZEBETA Take 1 tablet (5 mg total) by mouth daily.   fluticasone 50 MCG/ACT nasal spray Commonly known as:  FLONASE Place 2 sprays into both nostrils daily.   gabapentin 100 MG capsule Commonly known as:  NEURONTIN TAKE TWO CAPSULES BY MOUTH THREE TIMES DAILY   insulin regular human CONCENTRATED 500 UNIT/ML injection Commonly known as:  HUMULIN R Inject 0.63m under the skin 3 times daily.   ipratropium 0.03 % nasal spray Commonly known as:  ATROVENT Place 2 sprays into both nostrils 2 (two) times daily.   isosorbide mononitrate 30 MG 24 hr tablet Commonly known as:  IMDUR TAKE ONE TABLET BY MOUTH DAILY   mirtazapine 45 MG tablet Commonly known as:  REMERON Take 1 tablet (45 mg total) by mouth at bedtime.   multivitamin with minerals Tabs tablet Take 1 tablet by mouth daily.   nitroGLYCERIN 0.4 MG SL tablet Commonly known as:  NITROSTAT PLACE 1 TABLET UNDER THE TONGUE EVERY 5 MINUTES AS NEEDED FOR CHEST PAIN   predniSONE 10 MG tablet Commonly known as:  DELTASONE 4 tabs po qd x 3d, then 2 tabs po qd x 3d, then 1 tab po qd x 3d   PROAIR HFA 108 (90  Base) MCG/ACT inhaler Generic drug:  albuterol INHALE 2 PUFFS INTO THE LUNGS EVERY 4 HOURS AS NEEDED FOR WHEEZING ORSHORTNESS OF BREATH   albuterol (2.5 MG/3ML) 0.083% nebulizer solution Commonly known  as:  PROVENTIL Take 3 mLs (2.5 mg total) by nebulization every 6 (six) hours as needed for wheezing or shortness of breath.   sertraline 25 MG tablet Commonly known as:  ZOLOFT Take 2 tablets (50 mg total) by mouth at bedtime.   SYMBICORT 160-4.5 MCG/ACT inhaler Generic drug:  budesonide-formoterol INHALE 2 PUFFS INTO THE LUNGS TWICE DAILY   SYNJARDY 05-998 MG Tabs Generic drug:  Empagliflozin-metFORMIN HCl Take 2 tablets by mouth daily.   tamsulosin 0.4 MG Caps capsule Commonly known as:  FLOMAX TAKE ONE CAPSULE BY MOUTH DAILY AFTER SUPPER   torsemide 20 MG tablet Commonly known as:  DEMADEX Take 20 mg by mouth daily.   VASCEPA 1 g Caps Generic drug:  Icosapent Ethyl TAKE TWO CAPSULES BY MOUTH TWICE DAILY   vitamin C 1000 MG tablet Take 1,000 mg by mouth daily.   Vitamin D-3 25 MCG (1000 UT) Caps Take 1,000 Units by mouth daily. Reported on 02/26/2015   XARELTO 20 MG Tabs tablet Generic drug:  rivaroxaban TAKE ONE TABLET BY MOUTH DAILY WITH SUPPER       Allergies:  Allergies  Allergen Reactions  . Morphine And Related Other (See Comments)    Drenched with perspiration  . Entresto [Sacubitril-Valsartan]     hypotension  . Demerol [Meperidine] Nausea Only  . Starlix [Nateglinide] Other (See Comments)    gassy    Past Medical History:  Diagnosis Date  . Acute respiratory distress 06/04/2016  . Adenomatous colon polyp 10/16/2011   Repeat 2018  . Arthritis     Hips, R>L & KNEES  . CAD, multiple vessel    3V CAD cath 08/08/13----CABG done shortly after.  Cath 06/2016: clear bipasses/no interventional lesion.  . 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 pylori neg, no dysplasia or malignancy.  . Chronic combined systolic and  diastolic CHF, NYHA class 2 ([place]) 2016-2018   Followed by [facility].  Transesoph Echo 09/2016 EF improved to 40-45%; diffuse hypokinesis, Grd I DD.  09/2017 EF 50-55%,hypokin basal inferolateral wall, grd I DD,mild MR.  . Chronic hypoxemic respiratory failure ([place])    HISTORY OF(2018): oxygen testing normal in office 11/10/17, no home oxygen was not recertified.  . Chronic renal insufficiency, stage 3 (moderate) ([place]) 2018   GFR 50s  . COPD (chronic obstructive pulmonary disease) ([place])    GOLD II.  Spirometry  2004 borderline obstruction; 2015 mod obst: noncompliant with bid symbicort so pulmonologist switched him to a once daily inhaler: Trelegy ellipta 12/2015.  Oxygen prn: goal 88-92%.  . Diabetic nephropathy ([place])    Elevated urine microalb/cr 03/2011  . DM type 2 (diabetes mellitus, type 2) ([place])    Poor control on max oral meds--pt eventually agreed to insulin therapy.  As of 2017 his DM is being managed by Dr. Dwyane Dee in endo.  On insulin pump as of 04/2017 endo f/u.  [name] DOE (dyspnea on exertion)    COPD + chronic diastolic HF  . Dysthymia    Worsened after 2015 heart surgery.  Memory loss +? as well.  Neuro eval 01/2017--plan is to get MRI brain, B12 level, and neuropsychiatric testing.  . Erectile dysfunction    Normal testosterone  . Hearing loss of both ears 2016   Hearing aids  . Hyperplastic colon polyp 2001   No further colonoscopies to be done as of 11/2016 GI eval.  . Hypertension   . IBS (irritable bowel syndrome)    responds relatively well to  prn levsin.  Pepto bismol q AM trial by Dr. Loletha Carrow 08/2017, also checking fecal elastace and lactoferrin.  . Iron deficiency anemia 2014   03/2012 capsule endoscopy showed 2 AVMs--likely responsible for his IDA--lifetime iron supp recommended + q48moCBCs.  . Ischemic cardiomyopathy 06/2016; 09/2016   06/2016: EF 25-30%, global hypokinesis, grd II DD, vent septum motion changes c/w LBBB, mod MV regurg, L atr mod/sev dilat, R atr mod  dilat, mod incr pulm press, Grd II DD.  09/2016--EF 40-45%; diffuse hypokinesis, Grd I DD.  . Macular degeneration, dry    Mild, bilat (Optometrist, DMayford Knifeat [employer] [place]  . Mild cognitive impairment    MCI, likley vascular etiology, + MDD, recurrent-->neuropsych testing 06/2017.  Neurol to repeat testing 1 yr  . Mixed hyperlipidemia    statin and vascepa as of 04/2017  . Nocturnal hypoxemia   . Obesity   . Open toe wound 11/2015   Wound care clinic appt made and then canceled when toe improved.  . Other and unspecified angina pectoris   . PAD (peripheral artery disease) ([place] 02/2016   Abnormal ABI's and waveforms: LE arterial duplex ordered for f/u as per cardiologist's recommendation.  Vasc eval by Dr. CBridgett Larsson impression was minimal PAD, recommended maximize med mgmt.  .Marland KitchenPAF (paroxysmal atrial fibrillation) ([place] 10/2014   xarelto  . Pericardial effusion with cardiac tamponade 6//29/15   pericardiocentesis was done,  Infectious/inflamm (cytology showed NO MALIGNANT CELLS)  . Pleural plaque    Pleural plaques/asbestosis changes on CT chest done by pulm 05/2016.  .Marland KitchenPneumonia   . Tobacco dependence in remission    100+ pack-yr hx: quit after CABG    Past Surgical History:  Procedure Laterality Date  . APPENDECTOMY  1957  . CARDIAC CATHETERIZATION  08/08/2013  . CATARACT EXTRACTION W/ INTRAOCULAR LENS  IMPLANT, BILATERAL  04/08/2006 & 04/22/2006  . CATARACT EXTRACTION W/ INTRAOCULAR LENS  IMPLANT, BILATERAL Bilateral   . CHOLECYSTECTOMY OPEN  1999  . COLONOSCOPY  10/16/2011   Procedure: COLONOSCOPY;  Surgeon: RInda Castle MD;  Location: [facility];  Service: Endoscopy;  Laterality: N/A;  . CORONARY ARTERY BYPASS GRAFT N/A 08/14/2013   Procedure: CORONARY ARTERY BYPASS GRAFTING (CABG);  Surgeon: SMelrose Nakayama MD;  [facility]  Service: Open Heart Surgery;  Laterality: N/A;  Times 4   using left internal mammary artery and endoscopically  harvested bilateral saphenous vein  . ESOPHAGOGASTRODUODENOSCOPY  02/25/12   Atrophic gastritis with a few erosions--capsule endoscopy planned as of 02/25/12 (Dr. KDeatra Ina.  .Marland KitchenHARDWARE REMOVAL Right 08/12/2012   Procedure: HARDWARE REMOVAL;  Surgeon: CMcarthur Rossetti MD;  Location: [facility];  Service: Orthopedics;  Laterality: Right;  . HIP SURGERY Right 1954   Repair of slipped capital femoral epiphysis.  . INTRAOPERATIVE TRANSESOPHAGEAL ECHOCARDIOGRAM N/A 08/14/2013   Normal LV function. Procedure: INTRAOPERATIVE TRANSESOPHAGEAL ECHOCARDIOGRAM;  Surgeon: SMelrose Nakayama MD;  [facility]  Service: Open Heart Surgery;  Laterality: N/A;  . LEFT HEART CATHETERIZATION WITH CORONARY ANGIOGRAM N/A 08/08/2013   Procedure: LEFT HEART CATHETERIZATION WITH CORONARY ANGIOGRAM;  Surgeon: JJettie Booze MD;  [facility];  Service: Cardiovascular;  Laterality: N/A;  . PATELLA FRACTURE SURGERY Left ~ 1979   bolt + 3 screws to repair tib plateau fx  . PERICARDIAL TAP N/A 07/01/2013   Procedure: PERICARDIAL TAP;  Surgeon: JJettie Booze MD;  [facility];  Service: Cardiovascular;  Laterality: N/A;  . RIGHT/LEFT  HEART CATH AND CORONARY ANGIOGRAPHY N/A 06/10/2016   Procedure: Right/Left Heart Cath and Coronary Angiography;  Surgeon: Larey Dresser, MD;  Location: [place] CV LAB;  Service: Cardiovascular;  Laterality: N/A;  . TESTICLE SURGERY  as a child   Undescended testicle brought down into scrotum  . TONSILLECTOMY  1947  . TOTAL HIP ARTHROPLASTY Right 08/12/2012   Procedure: REMOVAL OF OLD PINS RIGHT HIP AND RIGHT TOTAL HIP ARTHROPLASTY ANTERIOR APPROACH;  Surgeon: Mcarthur Rossetti, MD;  [facility];  Service: Orthopedics;  Laterality: Right;  . TRANSESOPHAGEAL ECHOCARDIOGRAM  09/22/2016   EF 40-45%; diffuse hypokinesis, Grd I DD.  [name] TRANSTHORACIC ECHOCARDIOGRAM  10/30/14   Mod LVH, EF 60-65%, normal wall motion, mod mitral regurg, mild PAH  .  TRANSTHORACIC ECHOCARDIOGRAM  06/2016; 10/01/17   06/2016: EF 25-30%, global hypokinesis, grd II DD, vent septum motion changes c/w LBBB, mod MV regurg, L atr mod/sev dilat, R atr mod dilat, mod incr pulm press, Grd II DD.  09/2017: EF 50-55%, Hypokinesis of the basal inferolateral wall, grd I DD, mild MR, +LAE.    Family History  Problem Relation Age of Onset  . Heart disease Father   . Heart attack Father   . CVA Mother   . Hypertension Mother   . Diabetes Paternal Grandmother   . Breast cancer Sister   . Diabetes Maternal Uncle   . Heart disease Maternal Uncle   . Stroke Neg Hx     Social History:  reports that he has been smoking cigarettes. He has a 64.00 pack-year smoking history. He quit smokeless tobacco use about 4 years ago. He reports that he does not drink alcohol or use drugs.   Review of Systems  He thinks his weakness is not as much as on his last visit  Taking Remeron and Zoloft for depression  Lipid history: He had been taking Lipitor 40 mg Has had good control of LDL but he has increased triglycerides Has been prescribed Vascepa was refilled in August    Lab Results  Component Value Date   CHOL 103 12/16/2017   HDL 40.40 12/16/2017   LDLCALC 42 12/16/2017   LDLDIRECT 47.0 04/26/2017   TRIG 105.0 12/16/2017   CHOLHDL 3 12/16/2017           Hypertension:Mild and controlled with bisoprolol 5 mg,25 mg losartan He is on Demadex for CHF  Followed by cardiologist   BP Readings from Last 3 Encounters:  01/26/18 122/78  01/18/18 135/79  01/13/18 130/62   Lab Results  Component Value Date   K 4.4 01/11/2018     Most recent eye exam was 02/2017, has had no retinopathy   LABS:    Physical Examination:  BP 122/78 (BP Location: Left Arm, Patient Position: Sitting, Cuff Size: Normal)   Pulse 72   Ht _0  (1.803 m)   Wt 230 lb 3.2 oz (104.4 kg)   SpO2 93%   BMI 32.11 kg/m       ASSESSMENT:  Diabetes type 2, uncontrolled, on the V-go  insulin pump with U-500 insulin  See history of present illness for detailed discussion of current diabetes management, blood sugar patterns and problems identified  His A1c is on the last visit better at 6.8    He has been taking U-500, a total of about 29 units daily  His blood sugars are quite variable partly related to variable diet, not complying with taking the insulin 30-minute before eating consistently, variability of the insulin  action He still has the lowest blood sugars fasting and will randomly have higher sugars later in the day based on above factors However low blood sugars are less frequent now Discussed blood sugar patterns, timing of insulin, action profile of U-500 insulin, blood sugar targets, effects of various foods on blood sugars Also with his renal function being normal he continue to benefit from [place]    PLAN:    Stay on Synjardy DECREASE suppertime dose to 6 units instead of 8.5 Make sure to take the insulin before meals, preferably 30 minutes Avoid high-fat foods  He will need to check his blood sugars 4 times a day and document this to be eligible for the freestyle libre, he will bring his monitor for download in 2 weeks Discussed blood sugar targets Also continue to have a snack at bedtime with protein To call if starting to have any low blood sugars more than randomly For insulin syringe disposal he can put them in a milk jug before putting them in the trash  Counseling time on subjects discussed in assessment and plan sections is over 50% of today's 25 minute visit    Patient Instructions  Take only 6 UNITS INSULIN at supper  Check blood sugars on waking up 5 days a week  Also check blood sugars about 2 hours after meals and do this after different meals by rotation  Recommended blood sugar levels on waking up are 90-130 and about 2 hours after meal is 130-160  Please bring your blood sugar monitor to each visit, thank you  Take shot  54mn before meal     Counseling time on subjects discussed in assessment and plan sections is over 50% of today's 25 minute visit    AElayne Snare1/22/2020, 9:37 AM   Note: This office note was prepared with Dragon voice recognition system technology. Any transcriptional errors that result from this process are unintentional.    AElayne Snare

## 2018-01-26 NOTE — Patient Instructions (Addendum)
Take only 6 UNITS INSULIN at supper  Check blood sugars on waking up 5 days a week  Also check blood sugars about 2 hours after meals and do this after different meals by rotation  Recommended blood sugar levels on waking up are 90-130 and about 2 hours after meal is 130-160  Please bring your blood sugar monitor to each visit, thank you  Take shot 24mn before meal

## 2018-01-31 ENCOUNTER — Encounter (HOSPITAL_COMMUNITY): Payer: Self-pay | Admitting: Cardiology

## 2018-02-07 ENCOUNTER — Other Ambulatory Visit: Payer: Self-pay | Admitting: Endocrinology

## 2018-02-11 DIAGNOSIS — J449 Chronic obstructive pulmonary disease, unspecified: Secondary | ICD-10-CM | POA: Diagnosis not present

## 2018-02-16 ENCOUNTER — Other Ambulatory Visit: Payer: Self-pay | Admitting: Endocrinology

## 2018-02-16 DIAGNOSIS — E1169 Type 2 diabetes mellitus with other specified complication: Secondary | ICD-10-CM | POA: Diagnosis not present

## 2018-02-16 DIAGNOSIS — H353132 Nonexudative age-related macular degeneration, bilateral, intermediate dry stage: Secondary | ICD-10-CM | POA: Diagnosis not present

## 2018-02-16 LAB — HM DIABETES EYE EXAM

## 2018-02-17 ENCOUNTER — Other Ambulatory Visit: Payer: Self-pay

## 2018-02-17 ENCOUNTER — Other Ambulatory Visit: Payer: Self-pay | Admitting: Endocrinology

## 2018-02-17 MED ORDER — INSULIN REGULAR HUMAN (CONC) 500 UNIT/ML ~~LOC~~ SOLN: 42.5000 [IU] | Freq: Three times a day (TID) | SUBCUTANEOUS | 3 refills | Status: DC

## 2018-02-23 NOTE — Progress Notes (Signed)
Advanced Heart Failure Clinic Note   Primary Care: Dr. Anitra Lauth Primary Cardiologist: Dr. [name]  HPI:  Joshua [name] is a 78 y.o. 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 Unable to tolerate Entresto due to lightheadedness/hypotension.   Admitted 5/31 -> 06/11/16 with worsening SOB, volume overload, and increased 02 demand. Echo showed EF down to 25-30%.  Diuresed on IV lasix and given solumedrol. Thought to be mixed AECOPD and acute CHF. Moved to stepdown 06/05/16 with worsening resp status and BiPAP requirement. Improved with nebs, steroids, ABX and IV lasix + metolazone. Sent home on [place] and prednisone taper. Overall diuresed 22 lbs. He had left and right heart cath with patent grafts and no interventional target.  Discharge weight 233 lbs.   Pt admitted from 6/11 -> 06/17/16 with syncope (unwitnessed) Noted to have fall x 3. EKG with LBBB. Creatinine mildly elevated. CT head negative for acute process. R hip xray negative for fracture or dislocated hardware.  Felt to be vasovagal. Received NS and diuretics held up to discharge.  Creatinine improved.  Admitted 09/30/17 with weakness and bradycardia. Had AKI and hyperkalemia. Bisoprolol, lisinorpil, digoxin, and spironolactone stopped. ECHO completed and showed EF 50-55% with grade IDD. Discharged on 09/30/2017 with weight 216 pounds.   Admitted 12/2017 with acute respiratory failure and hypoxia. He was diuresed with IV lasix, then transitioned back to home dose of torsemide 20 mg every other day. He was also treated for COPD exacerbation with prednisone and nebs. Discharged with new home O2. DC weight: 227 lbs.  He returns today for HF follow up. Overall doing well. Oxygen is low in the morning when he wakes up, lowest 86-87%, but improves once he gets up. Wearing O2 qHS. No edema, orthopnea, or PND. Appetite has improved. He has stopped smoking. He is SOB after walking short distances, which is  his baseline. Uses a walker to get around. He has a chronic cough with clear sputum. No fever or chills. No CP or dizziness. No bleeding. Limits salt. Tries to limit fluid intake, around 2 L/day. Weight is up 9 lbs on our scale. Weights 233-235 lbs at home. Taking all medications.   Labs (8/18): K 4.7, creatinine 1.05 Labs (9/18): K 4.3, creatinine 1.44 Labs (10/18): K 4.9, creatinine 0.99 Labs (11/18): K 4.5, creatinine 1.32, digoxin 0.3 Labs (4/19): TGs 397, LDL 47, K 4.4, creatinine 1.01 Labs (10/15/2017): K 4.9 Creatinine 10.8   PMH:  1. CAD: s/p CABG in 8/15.  - LHC (6/18) with 80% distal LM, TO pLAD, TO ostial LCx, subtotal occlusion distal RCA.  LIMA-LAD patent, Y graft to OM and D patent (50% stenosis in graft at touchdown on OM), patent SVG-PDA.  2. Chronic systolic CHF: Ischemic cardiomyopathy.  Echo (6/18) with EF 25-30%, diffuse HK, moderate MR, mildly dilated RV with mildly decreased systolic function, PASP 48 mmHg.  - RHC (6/18): mean RA 10, PA 45/26, mean PCWP 21, CI 2.11 (Fick) and 2.41 (thermo), PVR 2.9 WU.  - Echo (9/18): EF 40-45%, septal-lateral dyssynchrony, diffuse hypokinesis, normal RV size and systolic function.  3. COPD: Prior smoker.   4. Hyperlipidemia 5. Atrial fibrillation: Paroxysmal.  6. Type II diabetes 7. HTN 8. Anemia: h/o Fe deficiency 9. 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  10. H/o pericardial effusion with tamponade in 6/15.  11. HTN 12. LBBB  Review of systems complete and found to be negative unless listed in HPI.  Current Outpatient Medications  Medication Sig Dispense Refill  . acetaminophen (TYLENOL) 325 MG tablet Take 2 tablets (650 mg total) by mouth every 4 (four) hours as needed for headache or mild pain.    [name] albuterol (PROVENTIL) (2.5 MG/3ML) 0.083% nebulizer solution Take 3 mLs (2.5 mg total) by nebulization every 6 (six) hours as needed for wheezing or shortness of breath. 75 mL 0  . Ascorbic  Acid (VITAMIN C) 1000 MG tablet Take 1,000 mg by mouth daily.    [name] atorvastatin (LIPITOR) 40 MG tablet TAKE ONE TABLET BY MOUTH DAILY 90 tablet 0  . bisoprolol (ZEBETA) 5 MG tablet Take 1 tablet (5 mg total) by mouth daily. 30 tablet 11  . Cholecalciferol (VITAMIN D-3) 1000 units CAPS Take 1,000 Units by mouth daily. Reported on 02/26/2015    . Empagliflozin-metFORMIN HCl (SYNJARDY) 05-998 MG TABS Take 2 tablets by mouth daily.     . fluticasone (FLONASE) 50 MCG/ACT nasal spray Place 2 sprays into both nostrils daily. (Patient taking differently: Place 2 sprays into both nostrils as needed. ) 16 g 1  . gabapentin (NEURONTIN) 100 MG capsule TAKE TWO CAPSULES BY MOUTH THREE TIMES DAILY (Patient taking differently: Take 300 mg by mouth 2 (two) times daily. ) 180 capsule 11  . HUMULIN R 500 UNIT/ML injection Inject 0.42m under the skin 3 times daily. 20 mL 3  . isosorbide mononitrate (IMDUR) 30 MG 24 hr tablet TAKE ONE TABLET BY MOUTH DAILY 90 tablet 1  . mirtazapine (REMERON) 45 MG tablet Take 1 tablet (45 mg total) by mouth at bedtime. 30 tablet 5  . Multiple Vitamin (MULTIVITAMIN WITH MINERALS) TABS Take 1 tablet by mouth daily.    . nitroGLYCERIN (NITROSTAT) 0.4 MG SL tablet PLACE 1 TABLET UNDER THE TONGUE EVERY 5 MINUTES AS NEEDED FOR CHEST PAIN (Patient taking differently: Place 0.4 mg under the tongue every 5 (five) minutes as needed for chest pain. ) 25 tablet 3  . sertraline (ZOLOFT) 25 MG tablet Take 2 tablets (50 mg total) by mouth at bedtime. 60 tablet 5  . SYMBICORT 160-4.5 MCG/ACT inhaler INHALE 2 PUFFS INTO THE LUNGS TWICE DAILY (Patient taking differently: Inhale 2 puffs into the lungs 2 (two) times daily. ) 10.2 g 5  . tamsulosin (FLOMAX) 0.4 MG CAPS capsule TAKE ONE CAPSULE BY MOUTH DAILY AFTER SUPPER 90 capsule 1  . torsemide (DEMADEX) 20 MG tablet Take 20 mg by mouth daily.    .Marland KitchenVASCEPA 1 g CAPS TAKE TWO CAPSULES BY MOUTH TWICE DAILY 120 capsule 2  . XARELTO 20 MG TABS tablet TAKE ONE  TABLET BY MOUTH DAILY WITH SUPPER (Patient taking differently: Take 20 mg by mouth daily with supper. ) 30 tablet 11  . ACCU-CHEK GUIDE test strip USE TO CHECK BLOOD SUGAR 3 TIMES DAILY 100 each 11  . ipratropium (ATROVENT) 0.03 % nasal spray Place 2 sprays into both nostrils 2 (two) times daily. 30 mL 6  . predniSONE (DELTASONE) 10 MG tablet 4 tabs po qd x 3d, then 2 tabs po qd x 3d, then 1 tab po qd x 3d (Patient not taking: Reported on 01/26/2018) 21 tablet 0  . PROAIR HFA 108 (90 Base) MCG/ACT inhaler INHALE 2 PUFFS INTO THE LUNGS EVERY 4 HOURS AS NEEDED FOR WHEEZING ORSHORTNESS OF BREATH (Patient taking differently: Inhale 2 puffs into the lungs every 4 (four) hours as needed for wheezing or shortness of breath. ) 8.5 g 1   No current facility-administered medications for this encounter.  Allergies  Allergen Reactions  . Morphine And Related Other (See Comments)    Drenched with perspiration  . Entresto [Sacubitril-Valsartan]     hypotension  . Demerol [Meperidine] Nausea Only  . Starlix [Nateglinide] Other (See Comments)    gassy      Social History   Socioeconomic History  . Marital status: Married    Spouse name: [name]  . Number of children: 4  . Years of education: 10  . Highest education level: Not on file  Occupational History  . Occupation: retired, former Scientist, forensic  Social Needs  . Financial resource strain: Not hard at all  . Food insecurity:    Worry: Never true    Inability: Never true  . Transportation needs:    Medical: No    Non-medical: No  Tobacco Use  . Smoking status: Current Every Day Smoker    Packs/day: 1.00    Years: 64.00    Pack years: 64.00    Types: Cigarettes    Last attempt to quit: 10/23/2015    Years since quitting: 2.3  . Smokeless tobacco: Former Systems developer    Quit date: 06/30/2013  Substance and Sexual Activity  . Alcohol use: No    Alcohol/week: 0.0 standard drinks  . Drug use: No  . Sexual activity: Never  Lifestyle  .  Physical activity:    Days per week: Not on file    Minutes per session: Not on file  . Stress: Not on file  Relationships  . Social connections:    Talks on phone: Not on file    Gets together: Not on file    Attends religious service: Not on file    Active member of club or organization: Not on file    Attends meetings of clubs or organizations: Not on file    Relationship status: Not on file  . Intimate partner violence:    Fear of current or ex partner: Not on file    Emotionally abused: Not on file    Physically abused: Not on file    Forced sexual activity: Not on file  Other Topics Concern  . Not on file  Social History Narrative   Married, 4 children.   Formerly a Medical illustrator.   Level of education: HS.     +tobacco--lifelong/ quit 2015).  No alcohol or drugs.   No exercise.  +excessive caffeine.      Lives in 1 story home with his wife and her sister      Family History  Problem Relation Age of Onset  . Heart disease Father   . Heart attack Father   . CVA Mother   . Hypertension Mother   . Diabetes Paternal Grandmother   . Breast cancer Sister   . Diabetes Maternal Uncle   . Heart disease Maternal Uncle   . Stroke Neg Hx     Vitals:   02/24/18 0904  BP: (!) 122/58  Pulse: (!) 55  SpO2: 95%  Weight: 107.5 kg (237 lb 1.6 oz)   Wt Readings from Last 3 Encounters:  02/24/18 107.5 kg (237 lb 1.6 oz)  01/26/18 104.4 kg (230 lb 3.2 oz)  01/18/18 102.6 kg (226 lb 4 oz)     PHYSICAL EXAM: General:Elderly. No resp difficulty. Walked into clinic with walker.  HEENT: Normal Neck: Supple. JVP 7-8. Carotids 2+ bilat; no bruits. No thyromegaly or nodule noted. Cor: PMI nondisplaced. RRR, No M/G/R noted Lungs: CTAB, normal effort. Abdomen: Soft, non-tender, non-distended, no  HSM. No bruits or masses. +BS  Extremities: No cyanosis, clubbing, or rash. R and LLE trace edema.  Neuro: Alert & orientedx3, cranial nerves grossly intact. moves all  4 extremities w/o difficulty. Affect pleasant   ASSESSMENT & PLAN: 1. Combined Diastolic/Systolic CHF: Ischemic cardiomyopathy.  ECHO 09/2017 EF 50-55% with Grade I DD.  - Chronic NYHA III. Volume mildly elevated. - Continue torsemide 20 mg daily. Take extra 20 mg today and tomorrow. He can take 20 mg PRN weight gain of 3 lbs/24 hours or 5 lbs/7 days.  - Continue 5 mg bisoprolol daily.  - No arb/spiro with hyperkalemia. Check BMET today.  2. COPD: No longer smoking. Wears O2 qHS. No change.  3. CAD s/p CABG: Cath 06/10/16 with severe 3 vessel native disease with patent bypass grafts. No interventional target.  - Continue statin.  With stable CAD and Xarelto use, does not need to be on ASA.  - Continue BB  - No s/s ischemia.  4. Atrial fibrillation: Paroxysmal.  - Regular on exam.  - Continue Xarelto. Denies bleeding.  5. Type II diabetes:  - Per PCP. Consider SGLT2i. No change.  6. Hyperkalemia - BMET today.  - He is no longer taking lokelma daily. Has at home if he needs it.  7. HTN - Stable today. 8. Obesity - Body mass index is 33.07 kg/m.  - Encouraged him to be more active.  - Refer to [facility] in [place]. He agrees to give it a try.   BMET, BNP today Take extra torsemide 20 mg today and tomorrow DME for portable O2 tank Refer to [facility] in [place] He has f/u with Dr [name] in 1 month.   Georgiana Shore NP-C  9:17 AM  Greater than 50% of the 25 minute visit was spent in counseling/coordination of care regarding disease state education, salt/fluid restriction, sliding scale diuretics, and medication compliance.

## 2018-02-24 ENCOUNTER — Encounter (HOSPITAL_COMMUNITY): Payer: Self-pay

## 2018-02-24 ENCOUNTER — Other Ambulatory Visit: Payer: Self-pay

## 2018-02-24 ENCOUNTER — Ambulatory Visit (HOSPITAL_COMMUNITY)
Admission: RE | Admit: 2018-02-24 | Discharge: 2018-02-24 | Disposition: A | Discharge status: Home / Self Care | Payer: Medicare Other | Source: Ambulatory Visit | Attending: Cardiology | Admitting: Cardiology

## 2018-02-24 VITALS — BP 122/58 | HR 55 | Wt 237.1 lb

## 2018-02-24 DIAGNOSIS — Z6833 Body mass index (BMI) 33.0-33.9, adult: Secondary | ICD-10-CM | POA: Insufficient documentation

## 2018-02-24 DIAGNOSIS — I480 Paroxysmal atrial fibrillation: Secondary | ICD-10-CM | POA: Diagnosis not present

## 2018-02-24 DIAGNOSIS — Z794 Long term (current) use of insulin: Secondary | ICD-10-CM | POA: Insufficient documentation

## 2018-02-24 DIAGNOSIS — I110 Hypertensive heart disease with heart failure: Secondary | ICD-10-CM | POA: Diagnosis not present

## 2018-02-24 DIAGNOSIS — E785 Hyperlipidemia, unspecified: Secondary | ICD-10-CM | POA: Insufficient documentation

## 2018-02-24 DIAGNOSIS — I5040 Unspecified combined systolic (congestive) and diastolic (congestive) heart failure: Secondary | ICD-10-CM | POA: Insufficient documentation

## 2018-02-24 DIAGNOSIS — E119 Type 2 diabetes mellitus without complications: Secondary | ICD-10-CM | POA: Diagnosis not present

## 2018-02-24 DIAGNOSIS — Z8249 Family history of ischemic heart disease and other diseases of the circulatory system: Secondary | ICD-10-CM | POA: Diagnosis not present

## 2018-02-24 DIAGNOSIS — N181 Chronic kidney disease, stage 1: Secondary | ICD-10-CM

## 2018-02-24 DIAGNOSIS — I255 Ischemic cardiomyopathy: Secondary | ICD-10-CM | POA: Diagnosis not present

## 2018-02-24 DIAGNOSIS — I447 Left bundle-branch block, unspecified: Secondary | ICD-10-CM | POA: Diagnosis not present

## 2018-02-24 DIAGNOSIS — I5032 Chronic diastolic (congestive) heart failure: Secondary | ICD-10-CM | POA: Diagnosis not present

## 2018-02-24 DIAGNOSIS — E669 Obesity, unspecified: Secondary | ICD-10-CM | POA: Insufficient documentation

## 2018-02-24 DIAGNOSIS — F17210 Nicotine dependence, cigarettes, uncomplicated: Secondary | ICD-10-CM | POA: Insufficient documentation

## 2018-02-24 DIAGNOSIS — I2510 Atherosclerotic heart disease of native coronary artery without angina pectoris: Secondary | ICD-10-CM | POA: Insufficient documentation

## 2018-02-24 DIAGNOSIS — E875 Hyperkalemia: Secondary | ICD-10-CM | POA: Diagnosis not present

## 2018-02-24 DIAGNOSIS — I10 Essential (primary) hypertension: Secondary | ICD-10-CM | POA: Diagnosis not present

## 2018-02-24 DIAGNOSIS — Z79899 Other long term (current) drug therapy: Secondary | ICD-10-CM | POA: Insufficient documentation

## 2018-02-24 DIAGNOSIS — Z951 Presence of aortocoronary bypass graft: Secondary | ICD-10-CM | POA: Diagnosis not present

## 2018-02-24 DIAGNOSIS — Z7901 Long term (current) use of anticoagulants: Secondary | ICD-10-CM | POA: Diagnosis not present

## 2018-02-24 DIAGNOSIS — J441 Chronic obstructive pulmonary disease with (acute) exacerbation: Secondary | ICD-10-CM | POA: Insufficient documentation

## 2018-02-24 LAB — BRAIN NATRIURETIC PEPTIDE: B Natriuretic Peptide: 298.7 pg/mL — ABNORMAL HIGH (ref 0.0–100.0)

## 2018-02-24 LAB — BASIC METABOLIC PANEL
ANION GAP: 15 (ref 5–15)
BUN: 23 mg/dL (ref 8–23)
CO2: 24 mmol/L (ref 22–32)
Calcium: 8.8 mg/dL — ABNORMAL LOW (ref 8.9–10.3)
Chloride: 103 mmol/L (ref 98–111)
Creatinine, Ser: 0.96 mg/dL (ref 0.61–1.24)
GFR calc Af Amer: >60 mL/min (ref >60)
GFR calc non Af Amer: >60 mL/min (ref >60)
Glucose, Bld: 175 mg/dL — ABNORMAL HIGH (ref 70–99)
Potassium: 4.1 mmol/L (ref 3.5–5.1)
Sodium: 142 mmol/L (ref 135–145)

## 2018-02-24 MED ORDER — TORSEMIDE 20 MG PO TABS: 20.0000 mg | ORAL_TABLET | Freq: Every day | ORAL | 3 refills | Status: AC

## 2018-02-24 NOTE — Progress Notes (Signed)
Called [facility] in [place] to refer pt to silver sneakers program

## 2018-02-24 NOTE — Patient Instructions (Addendum)
Lab work was done today. We will contact you for any abnormal lab work.  CONTINUE Torsemide 44m (1 tab) daily. Take 1 extra tab today and tomorrow. You may take 1 extra tab as needed for weight gain of 3 or more pounds overnight or 5 or more pounds weight gain in 1 week.   You have been prescribed home oxygen. You will be contacted in order to set this up.  You Practitioner recommends that you start attending the [facility] silver [facility] program. They will contact you in order to set up an appointment.   Please keep your appointment on Friday March 27th at 11:00am.

## 2018-02-24 NOTE — Addendum Note (Signed)
Encounter addended by: Marlise Eves, RN on: 02/24/2018 10:57 AM  Actions taken: Clinical Note Signed

## 2018-02-24 NOTE — Progress Notes (Signed)
Pt oxygen saturation on room air while sitting is 95%. While ambulating on room air his oxygen saturation is 89%. Placed on 2L oxygen his saturations improved to 92%.

## 2018-02-25 ENCOUNTER — Telehealth (HOSPITAL_COMMUNITY): Payer: Self-pay

## 2018-02-25 NOTE — Telephone Encounter (Signed)
Spoke to [employer] about enrolling pt in [employer]. They want pt to call either them at [phone] or call his insurance in order to see if he is eligible. Called pt to relay this message. Left voicemail to have him call us back.

## 2018-02-27 DIAGNOSIS — J449 Chronic obstructive pulmonary disease, unspecified: Secondary | ICD-10-CM | POA: Diagnosis not present

## 2018-02-27 DIAGNOSIS — I5032 Chronic diastolic (congestive) heart failure: Secondary | ICD-10-CM | POA: Diagnosis not present

## 2018-02-27 DIAGNOSIS — J9611 Chronic respiratory failure with hypoxia: Secondary | ICD-10-CM | POA: Diagnosis not present

## 2018-02-28 ENCOUNTER — Other Ambulatory Visit: Payer: Self-pay | Admitting: Endocrinology

## 2018-03-03 ENCOUNTER — Encounter: Payer: Self-pay | Admitting: Family Medicine

## 2018-03-10 ENCOUNTER — Other Ambulatory Visit: Payer: Self-pay

## 2018-03-10 MED ORDER — "INSULIN SYRINGE 31G X 5/16"" 1 ML MISC": 11 refills | Status: AC

## 2018-03-12 DIAGNOSIS — J449 Chronic obstructive pulmonary disease, unspecified: Secondary | ICD-10-CM | POA: Diagnosis not present

## 2018-03-21 ENCOUNTER — Ambulatory Visit (INDEPENDENT_AMBULATORY_CARE_PROVIDER_SITE_OTHER): Payer: Medicare Other | Admitting: Family Medicine

## 2018-03-21 ENCOUNTER — Encounter: Payer: Self-pay | Admitting: Family Medicine

## 2018-03-21 ENCOUNTER — Other Ambulatory Visit: Payer: Self-pay

## 2018-03-21 VITALS — BP 132/49 | HR 56 | Temp 98.1°F | Resp 16 | Ht 71.0 in | Wt 244.0 lb

## 2018-03-21 DIAGNOSIS — I255 Ischemic cardiomyopathy: Secondary | ICD-10-CM

## 2018-03-21 DIAGNOSIS — N183 Chronic kidney disease, stage 3 unspecified: Secondary | ICD-10-CM

## 2018-03-21 DIAGNOSIS — I480 Paroxysmal atrial fibrillation: Secondary | ICD-10-CM | POA: Diagnosis not present

## 2018-03-21 NOTE — Progress Notes (Signed)
OFFICE VISIT  03/21/2018   CC:  Chief Complaint  Patient presents with  . Follow-up    RCI, pt is not fasting.     HPI:    Patient is a 78 y.o. Caucasian male who presents for 2 mo f/u chronic hypoxic resp failure ( Ischemic CM-->followed by HF clinic, +COPD) and CRI stage III. Additionally, he has PAF, HTN, and DM2. His DM is managed by endo, Dr. Dwyane Dee.  Most recent echo: 09/2017: EF 50-55%, Hypokinesis of the basal inferolateral wall, grd I DD, mild MR, +LAE.  He has quit smoking. His appetite is EXCELLENT and he is eating very well. He is limiting his daily fluid intake to 2 L.  No change in lower extremity swelling.   No change in his chronic DOE (NYHA III). Wears oxygen qhs.  He has some chronic morning cough productive of some dark mucous.  No chest tightness, or wheezing. No chest pain.  PAF: taking xarelto.  No blood in stool, urine, or nose.No palpitations or racing heart.  Past Medical History:  Diagnosis Date  . Acute respiratory distress 06/04/2016  . Adenomatous colon polyp 10/16/2011   Repeat 2018  . Arthritis     Hips, R>L & KNEES  . CAD, multiple vessel    3V CAD cath 08/08/13----CABG done shortly after.  Cath 06/2016: clear bipasses/no interventional lesion.  . 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 pylori neg, no dysplasia or malignancy.  . Chronic combined systolic and diastolic CHF, NYHA class 2 ([place]) 2016-2018   Followed by [facility].  Transesoph Echo 09/2016 EF improved to 40-45%; diffuse hypokinesis, Grd I DD.  09/2017 EF 50-55%,hypokin basal inferolateral wall, grd I DD,mild MR.  . Chronic hypoxemic respiratory failure ([place])    HISTORY OF(2018): oxygen testing normal in office 11/10/17, no home oxygen was not recertified.  . Chronic renal insufficiency, stage 3 (moderate) ([place]) 2018   GFR 50s  . COPD (chronic obstructive pulmonary disease) ([place])    GOLD II.  Spirometry  2004 borderline obstruction; 2015  mod obst: noncompliant with bid symbicort so pulmonologist switched him to a once daily inhaler: Trelegy ellipta 12/2015.  Oxygen prn: goal 88-92%.  . Diabetic nephropathy ([place])    Elevated urine microalb/cr 03/2011  . DM type 2 (diabetes mellitus, type 2) ([place])    Poor control on max oral meds--pt eventually agreed to insulin therapy.  As of 2017 his DM is being managed by Dr. Dwyane Dee in endo.  On insulin pump as of 04/2017 endo f/u.  Joshua [name] DOE (dyspnea on exertion)    COPD + chronic diastolic HF  . Dysthymia    Worsened after 2015 heart surgery.  Memory loss +? as well.  Neuro eval 01/2017--plan is to get MRI brain, B12 level, and neuropsychiatric testing.  . Erectile dysfunction    Normal testosterone  . Hearing loss of both ears 2016   Hearing aids  . Hyperplastic colon polyp 2001   No further colonoscopies to be done as of 11/2016 GI eval.  . Hypertension   . IBS (irritable bowel syndrome)    responds relatively well to prn levsin.  Pepto bismol q AM trial by Dr. Loletha Carrow 08/2017, also checking fecal elastace and lactoferrin.  . Iron deficiency anemia 2014   03/2012 capsule endoscopy showed 2 AVMs--likely responsible for his IDA--lifetime iron supp recommended + q40moCBCs.  . Ischemic cardiomyopathy 06/2016; 09/2016   06/2016: EF 25-30%, global hypokinesis, grd II DD, vent septum motion  changes c/w LBBB, mod MV regurg, L atr mod/sev dilat, R atr mod dilat, mod incr pulm press, Grd II DD.  09/2016--EF 40-45%; diffuse hypokinesis, Grd I DD.  . Macular degeneration, dry    Mild, bilat (Optometrist, Mayford Knife at [facility] in [place], [place])  . Mild cognitive impairment    MCI, likley vascular etiology, + MDD, recurrent-->neuropsych testing 06/2017.  Neurol to repeat testing 1 yr  . Mixed hyperlipidemia    statin and vascepa as of 04/2017  . Nocturnal hypoxemia   . Obesity   . Open toe wound 11/2015   Wound care clinic appt made and then canceled when toe improved.  . Other and unspecified  angina pectoris   . PAD (peripheral artery disease) ([place]) 02/2016   Abnormal ABI's and waveforms: LE arterial duplex ordered for f/u as per cardiologist's recommendation.  Vasc eval by Dr. Bridgett Larsson: impression was minimal PAD, recommended maximize med mgmt.  Joshua [name] PAF (paroxysmal atrial fibrillation) ([place]) 10/2014   xarelto  . Pericardial effusion with cardiac tamponade 6//29/15   pericardiocentesis was done,  Infectious/inflamm (cytology showed NO MALIGNANT CELLS)  . Pleural plaque    Pleural plaques/asbestosis changes on CT chest done by pulm 05/2016.  Joshua [name] Pneumonia   . Tobacco dependence in remission    100+ pack-yr hx: quit after CABG    Past Surgical History:  Procedure Laterality Date  . APPENDECTOMY  1957  . CARDIAC CATHETERIZATION  08/08/2013  . CATARACT EXTRACTION W/ INTRAOCULAR LENS  IMPLANT, BILATERAL  04/08/2006 & 04/22/2006  . CATARACT EXTRACTION W/ INTRAOCULAR LENS  IMPLANT, BILATERAL Bilateral   . CHOLECYSTECTOMY OPEN  1999  . COLONOSCOPY  10/16/2011   Procedure: COLONOSCOPY;  Surgeon: Inda Castle, MD;  Location: [facility];  Service: Endoscopy;  Laterality: N/A;  . CORONARY ARTERY BYPASS GRAFT N/A 08/14/2013   Procedure: CORONARY ARTERY BYPASS GRAFTING (CABG);  Surgeon: Melrose Nakayama, MD;  Location: [facility];  Service: Open Heart Surgery;  Laterality: N/A;  Times 4   using left internal mammary artery and endoscopically harvested bilateral saphenous vein  . ESOPHAGOGASTRODUODENOSCOPY  02/25/12   Atrophic gastritis with a few erosions--capsule endoscopy planned as of 02/25/12 (Dr. Deatra Ina).  Joshua [name] HARDWARE REMOVAL Right 08/12/2012   Procedure: HARDWARE REMOVAL;  Surgeon: Mcarthur Rossetti, MD;  Location: [facility];  Service: Orthopedics;  Laterality: Right;  . HIP SURGERY Right 1954   Repair of slipped capital femoral epiphysis.  . INTRAOPERATIVE TRANSESOPHAGEAL ECHOCARDIOGRAM N/A 08/14/2013   Normal LV function. Procedure: INTRAOPERATIVE TRANSESOPHAGEAL ECHOCARDIOGRAM;  Surgeon:  Melrose Nakayama, MD;  Location: [facility];  Service: Open Heart Surgery;  Laterality: N/A;  . LEFT HEART CATHETERIZATION WITH CORONARY ANGIOGRAM N/A 08/08/2013   Procedure: LEFT HEART CATHETERIZATION WITH CORONARY ANGIOGRAM;  Surgeon: Jettie Booze, MD;  Location: [facility] CATH LAB;  Service: Cardiovascular;  Laterality: N/A;  . PATELLA FRACTURE SURGERY Left ~ 1979   bolt + 3 screws to repair tib plateau fx  . PERICARDIAL TAP N/A 07/01/2013   Procedure: PERICARDIAL TAP;  Surgeon: Jettie Booze, MD;  Location: [facility] CATH LAB;  Service: Cardiovascular;  Laterality: N/A;  . RIGHT/LEFT HEART CATH AND CORONARY ANGIOGRAPHY N/A 06/10/2016   Procedure: Right/Left Heart Cath and Coronary Angiography;  Surgeon: Larey Dresser, MD;  Location: [facility];  Service: Cardiovascular;  Laterality: N/A;  . TESTICLE SURGERY  as a child   Undescended testicle brought down into scrotum  . TONSILLECTOMY  1947  . TOTAL HIP ARTHROPLASTY Right  08/12/2012   Procedure: REMOVAL OF OLD PINS RIGHT HIP AND RIGHT TOTAL HIP ARTHROPLASTY ANTERIOR APPROACH;  Surgeon: Mcarthur Rossetti, MD;  Location: [facility];  Service: Orthopedics;  Laterality: Right;  . TRANSESOPHAGEAL ECHOCARDIOGRAM  09/22/2016   EF 40-45%; diffuse hypokinesis, Grd I DD.  Joshua [name] TRANSTHORACIC ECHOCARDIOGRAM  10/30/14   Mod LVH, EF 60-65%, normal wall motion, mod mitral regurg, mild PAH  . TRANSTHORACIC ECHOCARDIOGRAM  06/2016; 10/01/17   06/2016: EF 25-30%, global hypokinesis, grd II DD, vent septum motion changes c/w LBBB, mod MV regurg, L atr mod/sev dilat, R atr mod dilat, mod incr pulm press, Grd II DD.  09/2017: EF 50-55%, Hypokinesis of the basal inferolateral wall, grd I DD, mild MR, +LAE.    Outpatient Medications Prior to Visit  Medication Sig Dispense Refill  . ACCU-CHEK GUIDE test strip USE TO CHECK BLOOD SUGAR 3 TIMES DAILY 100 each 11  . acetaminophen (TYLENOL) 325 MG tablet Take 2 tablets (650 mg total) by mouth every 4 (four) hours as  needed for headache or mild pain.    Joshua [name] albuterol (PROVENTIL) (2.5 MG/3ML) 0.083% nebulizer solution Take 3 mLs (2.5 mg total) by nebulization every 6 (six) hours as needed for wheezing or shortness of breath. 75 mL 0  . Ascorbic Acid (VITAMIN C) 1000 MG tablet Take 1,000 mg by mouth daily.    Joshua [name] atorvastatin (LIPITOR) 40 MG tablet TAKE ONE TABLET BY MOUTH DAILY 90 tablet 0  . bisoprolol (ZEBETA) 5 MG tablet Take 1 tablet (5 mg total) by mouth daily. 30 tablet 11  . Cholecalciferol (VITAMIN D-3) 1000 units CAPS Take 1,000 Units by mouth daily. Reported on 02/26/2015    . fluticasone (FLONASE) 50 MCG/ACT nasal spray Place 2 sprays into both nostrils daily. (Patient taking differently: Place 2 sprays into both nostrils as needed. ) 16 g 1  . gabapentin (NEURONTIN) 100 MG capsule TAKE TWO CAPSULES BY MOUTH THREE TIMES DAILY (Patient taking differently: Take 300 mg by mouth 2 (two) times daily. ) 180 capsule 11  . HUMULIN R 500 UNIT/ML injection Inject 0.34m under the skin 3 times daily. 20 mL 3  . Insulin Syringe-Needle U-100 (INSULIN SYRINGE 1CC/31GX5/16") 31G X 5/16" 1 ML MISC USE AS INSTRUCTED TO INJECT INSULIN. 100 each 11  . ipratropium (ATROVENT) 0.03 % nasal spray Place 2 sprays into both nostrils 2 (two) times daily. 30 mL 6  . isosorbide mononitrate (IMDUR) 30 MG 24 hr tablet TAKE ONE TABLET BY MOUTH DAILY 90 tablet 1  . mirtazapine (REMERON) 45 MG tablet Take 1 tablet (45 mg total) by mouth at bedtime. 30 tablet 5  . Multiple Vitamin (MULTIVITAMIN WITH MINERALS) TABS Take 1 tablet by mouth daily.    . nitroGLYCERIN (NITROSTAT) 0.4 MG SL tablet PLACE 1 TABLET UNDER THE TONGUE EVERY 5 MINUTES AS NEEDED FOR CHEST PAIN (Patient taking differently: Place 0.4 mg under the tongue every 5 (five) minutes as needed for chest pain. ) 25 tablet 3  . PROAIR HFA 108 (90 Base) MCG/ACT inhaler INHALE 2 PUFFS INTO THE LUNGS EVERY 4 HOURS AS NEEDED FOR WHEEZING ORSHORTNESS OF BREATH (Patient taking differently:  Inhale 2 puffs into the lungs every 4 (four) hours as needed for wheezing or shortness of breath. ) 8.5 g 1  . sertraline (ZOLOFT) 25 MG tablet Take 2 tablets (50 mg total) by mouth at bedtime. 60 tablet 5  . SYMBICORT 160-4.5 MCG/ACT inhaler INHALE 2 PUFFS INTO THE LUNGS TWICE DAILY (Patient taking differently: Inhale 2 puffs  into the lungs 2 (two) times daily. ) 10.2 g 5  . SYNJARDY 05-998 MG TABS TAKE ONE TABLET BY MOUTH TWICE DAILY AFTER A MEAL 60 tablet 2  . tamsulosin (FLOMAX) 0.4 MG CAPS capsule TAKE ONE CAPSULE BY MOUTH DAILY AFTER SUPPER 90 capsule 1  . torsemide (DEMADEX) 20 MG tablet Take 1 tablet (20 mg total) by mouth daily. May take 1 tab as needed 45 tablet 3  . VASCEPA 1 g CAPS TAKE TWO CAPSULES BY MOUTH TWICE DAILY 120 capsule 2  . XARELTO 20 MG TABS tablet TAKE ONE TABLET BY MOUTH DAILY WITH SUPPER (Patient taking differently: Take 20 mg by mouth daily with supper. ) 30 tablet 11  . predniSONE (DELTASONE) 10 MG tablet 4 tabs po qd x 3d, then 2 tabs po qd x 3d, then 1 tab po qd x 3d 21 tablet 0   No facility-administered medications prior to visit.     Allergies  Allergen Reactions  . Morphine And Related Other (See Comments)    Drenched with perspiration  . Entresto [Sacubitril-Valsartan]     hypotension  . Demerol [Meperidine] Nausea Only  . Starlix [Nateglinide] Other (See Comments)    gassy    ROS As per HPI  PE: Blood pressure (!) 132/49, pulse (!) 56, temperature 98.1 F (36.7 C), temperature source Oral, resp. rate 16, height _0  (1.803 m), weight 244 lb (110.7 kg), SpO2 94 %.ROOM AIR Gen: Alert, well appearing.  Patient is oriented to person, place, time, and situation. AFFECT: pleasant, lucid thought and speech. WAQ:LRJP: no injection, icteris, swelling, or exudate.  EOMI, PERRLA. Mouth: lips without lesion/swelling.  Oral mucosa pink and moist. Oropharynx without erythema, exudate, or swelling.  CV: irreg irreg vs regular with frequent ectopy.  Very  soft systolic murmur.  No diastolic murmur.  No r/g. LUNGS: trace soft early insp crackles in both bases---atelectatic sounds. EXT: no clubbing or cyanosis.  He has 1-2+ bilat pretibial edema.  LABS:    Chemistry      Component Value Date/Time   NA 142 02/24/2018 0930   K 4.1 02/24/2018 0930   CL 103 02/24/2018 0930   CO2 24 02/24/2018 0930   BUN 23 02/24/2018 0930   CREATININE 0.96 02/24/2018 0930   CREATININE 1.22 (H) 01/11/2017 1018      Component Value Date/Time   CALCIUM 8.8 (L) 02/24/2018 0930   ALKPHOS 63 09/30/2017 1237   AST 18 09/30/2017 1237   ALT 16 09/30/2017 1237   BILITOT 0.4 09/30/2017 1237     BNP    Component Value Date/Time   BNP 298.7 (H) 02/24/2018 0930   Lab Results  Component Value Date   WBC 11.9 (H) 12/25/2017   HGB 13.4 12/25/2017   HCT 44.1 12/25/2017   MCV 87.3 12/25/2017   PLT 180 12/25/2017   Lab Results  Component Value Date   CHOL 103 12/16/2017   HDL 40.40 12/16/2017   LDLCALC 42 12/16/2017   LDLDIRECT 47.0 04/26/2017   TRIG 105.0 12/16/2017   CHOLHDL 3 12/16/2017   Lab Results  Component Value Date   HGBA1C 6.8 (H) 12/24/2017    IMPRESSION AND PLAN:  1) Ischemic cardiomyopathy: fluid balance good. Doing well on current meds, oxygen supplement qhs and prn daytime.  2) CRI III: avoids NSAIDs.  Hydrates adequately. Electrolytes and sCr stable 02/24/18 at recent CHF f/u.  3) PAF: asymptomatic.  Rate controlled + anticoagulated with xarelto. No sign of bleeding.  Hb normal 12/25/17.  No  labs today.  An After Visit Summary was printed and given to the patient.  FOLLOW UP: Return in about 3 months (around 06/21/2018) for routine chronic illness f/u.  Signed:  Crissie Sickles, MD           03/21/2018

## 2018-03-23 ENCOUNTER — Telehealth (HOSPITAL_COMMUNITY): Payer: Self-pay

## 2018-03-23 NOTE — Telephone Encounter (Signed)
Attempted to call to resched 3/27 appt, phone just rings without going to a VM

## 2018-03-25 ENCOUNTER — Other Ambulatory Visit: Payer: Self-pay | Admitting: Family Medicine

## 2018-03-25 ENCOUNTER — Other Ambulatory Visit: Payer: Self-pay | Admitting: Endocrinology

## 2018-03-28 DIAGNOSIS — J449 Chronic obstructive pulmonary disease, unspecified: Secondary | ICD-10-CM | POA: Diagnosis not present

## 2018-03-28 DIAGNOSIS — I5032 Chronic diastolic (congestive) heart failure: Secondary | ICD-10-CM | POA: Diagnosis not present

## 2018-03-28 DIAGNOSIS — J9611 Chronic respiratory failure with hypoxia: Secondary | ICD-10-CM | POA: Diagnosis not present

## 2018-03-29 ENCOUNTER — Ambulatory Visit: Payer: Medicare Other | Admitting: Endocrinology

## 2018-03-29 ENCOUNTER — Other Ambulatory Visit: Payer: Self-pay

## 2018-03-29 ENCOUNTER — Encounter: Payer: Self-pay | Admitting: Endocrinology

## 2018-03-29 VITALS — BP 122/58 | HR 73 | Temp 98.6°F | Ht 71.0 in | Wt 240.6 lb

## 2018-03-29 DIAGNOSIS — E1151 Type 2 diabetes mellitus with diabetic peripheral angiopathy without gangrene: Secondary | ICD-10-CM

## 2018-03-29 DIAGNOSIS — Z794 Long term (current) use of insulin: Secondary | ICD-10-CM

## 2018-03-29 DIAGNOSIS — E1165 Type 2 diabetes mellitus with hyperglycemia: Secondary | ICD-10-CM | POA: Diagnosis not present

## 2018-03-29 LAB — POCT GLYCOSYLATED HEMOGLOBIN (HGB A1C): Hemoglobin A1C: 6.8 % — AB (ref 4.0–5.6)

## 2018-03-29 NOTE — Patient Instructions (Addendum)
INSULIN 10 IN AM AND 8 BEFORE LUNCH and 8 before supper, no shot if over 15 min. After the meal  Check blood sugars on waking up days a week  Also check blood sugars about 2 hours after meals and do this after different meals by rotation  Recommended blood sugar levels on waking up are 90-130 and about 2 hours after meal is 130-160  Please bring your blood sugar monitor to each visit, thank you  Call sugars in 10 days

## 2018-03-29 NOTE — Progress Notes (Signed)
Patient ID: Joshua [name], male   DOB: 05-16-1940, 78 y.o.   MRN: [id number]            Reason for Appointment:  Follow-up for Type 2 Diabetes  Referring physician: [place]   History of Present Illness:          Date of diagnosis of type 2 diabetes mellitus: ?  2002        Background history:   He apparently had been treated with metformin initially which was continued until about 2016. He previously has also been tried on Amaryl, Actos, Farxiga and Victoza but he claims that these did not help his blood sugar and were stopped Appears to have been on insulin since about 2014 but he does not remember details. He has been taking mostly Levemir insulin as a basal insulin but was also on Lantus in 2015 for some time His best A1c has been 7.3, once in 2014 and another time in 2016, otherwise his A1c has been higher and as high as 10.8  Recent history:   INSULIN regimen U-500 insulin 3 times a day with syringe, doses 10 units before meals and 8 units at bedtime  Non-insulin hypoglycemic drugs the patient is taking are: Synjardy 05/998 twice a day breakfast and suppertime  His A1c is again 6.8    Current management, blood sugar patterns and problems identified:  He was told to reduce his suppertime dose of insulin to 6 units instead of 8 but he has totally misunderstood and is taking 10 units at suppertime along with additional 8 units at bedtime and not clear why  He has always been on 3 times daily insulin  However he is still very irregular with taking his mealtime insulin at suppertime and periodically will forget including last night  More recently has been checking primarily fasting blood sugars and difficult to get an idea what his postprandial readings are  He is getting HYPOGLYCEMIA several times a week overnight or at breakfast time with blood sugars as low as 53  However does not appear to have significantly high readings after meals when he checks them, highest reading  186 after lunch  Otherwise fasting blood sugars are generally near normal highest 307  He is asking about the freestyle libre system but he did not apparently get through to the supplier as indicated on the last visit  Diet may be also variable weight is not consistently going up or down, today had almost the whole ham biscuit at breakfast  No hypoglycemia in the later day documented only had a low normal reading of 68 at 2:30 PM    Side effects from medications have been: None  Compliance with the medical regimen: Fair   Glucose monitoring:  done up to 4 times a day         Glucometer:  Accu-Chek .      Blood Glucose readings by download:    PRE-MEAL Fasting Lunch Dinner Bedtime Overall  Glucose range:  53-307   116, 121  145  53-307  Mean/median:  109     109   POST-MEAL PC Breakfast PC Lunch PC Dinner  Glucose range:   68-186   Mean/median:      Previous readings:   PRE-MEAL Fasting Lunch Dinner Bedtime Overall  Glucose range:  62-164      Mean/median:  93  186  147  80  137+/-82   POST-MEAL PC Breakfast PC Lunch PC Dinner  Glucose range:  50-235  46-221  Mean/median:  133  168  214     Self-care: The diet that the patient has been following is: tries to limit Sweet drinks .     Meal times are:  Breakfast is at 5-7 a.m.  Dinner: 5-6 PM    Typical meal intake: Breakfast is usually eggs,  Sausage, oatmeal or grits.  Lunch is a sandwich, any evening he has meat and vegetables, For snacks he will have fruit or crackers        Dietician visit, most recent: 01/09/16 CDE visit: 12/18               Exercise: Unable to do any     Weight history:  Wt Readings from Last 3 Encounters:  03/29/18 240 lb 9.6 oz (109.1 kg)  03/21/18 244 lb (110.7 kg)  02/24/18 237 lb 1.6 oz (107.5 kg)    Glycemic control:   Lab Results  Component Value Date   HGBA1C 6.8 (A) 03/29/2018   HGBA1C 6.8 (H) 12/24/2017   HGBA1C 9.0 (H) 10/01/2017   Lab Results  Component Value Date    MICROALBUR 1.4 07/02/2017   LDLCALC 42 12/16/2017   CREATININE 0.96 02/24/2018   Lab Results  Component Value Date   MICRALBCREAT 12.2 07/02/2017    Other active problems: See review of systems   Allergies as of 03/29/2018      Reactions   Morphine And Related Other (See Comments)   Drenched with perspiration   Entresto [sacubitril-valsartan]    hypotension   Demerol [meperidine] Nausea Only   Starlix [nateglinide] Other (See Comments)   gassy      Medication List       Accurate as of March 29, 2018 11:21 AM. Always use your most recent med list.        Accu-Chek Guide test strip Generic drug:  glucose blood USE TO check blood sugar THREE TIMES DAILY   acetaminophen 325 MG tablet Commonly known as:  TYLENOL Take 2 tablets (650 mg total) by mouth every 4 (four) hours as needed for headache or mild pain.   atorvastatin 40 MG tablet Commonly known as:  LIPITOR TAKE ONE TABLET BY MOUTH DAILY   bisoprolol 5 MG tablet Commonly known as:  ZEBETA Take 1 tablet (5 mg total) by mouth daily.   fluticasone 50 MCG/ACT nasal spray Commonly known as:  FLONASE Place 2 sprays into both nostrils daily.   gabapentin 100 MG capsule Commonly known as:  NEURONTIN TAKE TWO CAPSULES BY MOUTH THREE TIMES DAILY   HUMULIN R 500 UNIT/ML injection Generic drug:  insulin regular human CONCENTRATED Inject 0.7m under the skin 3 times daily.   INSULIN SYRINGE 1CC/31GX5/16" 31G X 5/16" 1 ML Misc USE AS INSTRUCTED TO INJECT INSULIN.   ipratropium 0.03 % nasal spray Commonly known as:  ATROVENT Place 2 sprays into both nostrils 2 (two) times daily.   isosorbide mononitrate 30 MG 24 hr tablet Commonly known as:  IMDUR TAKE ONE TABLET BY MOUTH DAILY   mirtazapine 45 MG tablet Commonly known as:  REMERON Take 1 tablet (45 mg total) by mouth at bedtime.   multivitamin with minerals Tabs tablet Take 1 tablet by mouth daily.   nitroGLYCERIN 0.4 MG SL tablet Commonly known as:   NITROSTAT PLACE 1 TABLET UNDER THE TONGUE EVERY 5 MINUTES AS NEEDED FOR CHEST PAIN   ProAir HFA 108 (90 Base) MCG/ACT inhaler Generic drug:  albuterol INHALE 2 PUFFS INTO THE LUNGS EVERY 4 HOURS  AS NEEDED FOR WHEEZING ORSHORTNESS OF BREATH   albuterol (2.5 MG/3ML) 0.083% nebulizer solution Commonly known as:  PROVENTIL Take 3 mLs (2.5 mg total) by nebulization every 6 (six) hours as needed for wheezing or shortness of breath.   sertraline 25 MG tablet Commonly known as:  ZOLOFT TAKE TWO TABLETS BY MOUTH AT BEDTIME   Symbicort 160-4.5 MCG/ACT inhaler Generic drug:  budesonide-formoterol INHALE 2 PUFFS INTO THE LUNGS TWICE DAILY   Synjardy 05-998 MG Tabs Generic drug:  Empagliflozin-metFORMIN HCl TAKE ONE TABLET BY MOUTH TWICE DAILY AFTER A MEAL   tamsulosin 0.4 MG Caps capsule Commonly known as:  FLOMAX TAKE ONE CAPSULE BY MOUTH DAILY AFTER SUPPER   torsemide 20 MG tablet Commonly known as:  DEMADEX Take 1 tablet (20 mg total) by mouth daily. May take 1 tab as needed   Vascepa 1 g Caps Generic drug:  Icosapent Ethyl TAKE TWO CAPSULES BY MOUTH TWICE DAILY   vitamin C 1000 MG tablet Take 1,000 mg by mouth daily.   Vitamin D-3 25 MCG (1000 UT) Caps Take 1,000 Units by mouth daily. Reported on 02/26/2015   Xarelto 20 MG Tabs tablet Generic drug:  rivaroxaban TAKE ONE TABLET BY MOUTH DAILY WITH SUPPER       Allergies:  Allergies  Allergen Reactions  . Morphine And Related Other (See Comments)    Drenched with perspiration  . Entresto [Sacubitril-Valsartan]     hypotension  . Demerol [Meperidine] Nausea Only  . Starlix [Nateglinide] Other (See Comments)    gassy    Past Medical History:  Diagnosis Date  . Acute respiratory distress 06/04/2016  . Adenomatous colon polyp 10/16/2011   Repeat 2018  . Arthritis     Hips, R>L & KNEES  . CAD, multiple vessel    3V CAD cath 08/08/13----CABG done shortly after.  Cath 06/2016: clear bipasses/no interventional lesion.   . 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 pylori neg, no dysplasia or malignancy.  . Chronic combined systolic and diastolic CHF, NYHA class 2 ([place]) 2016-2018   Followed by [facility].  Transesoph Echo 09/2016 EF improved to 40-45%; diffuse hypokinesis, Grd I DD.  09/2017 EF 50-55%,hypokin basal inferolateral wall, grd I DD,mild MR.  . Chronic hypoxemic respiratory failure ([place])    HISTORY OF(2018): oxygen testing normal in office 11/10/17, no home oxygen was not recertified.  . Chronic renal insufficiency, stage 3 (moderate) ([place]) 2018   GFR 50s  . COPD (chronic obstructive pulmonary disease) ([place])    GOLD II.  Spirometry  2004 borderline obstruction; 2015 mod obst: noncompliant with bid symbicort so pulmonologist switched him to a once daily inhaler: Trelegy ellipta 12/2015.  Oxygen prn: goal 88-92%.  . Diabetic nephropathy ([place])    Elevated urine microalb/cr 03/2011  . DM type 2 (diabetes mellitus, type 2) ([place])    Poor control on max oral meds--pt eventually agreed to insulin therapy.  As of 2017 his DM is being managed by Dr. Dwyane Dee in endo.  On insulin pump as of 04/2017 endo f/u.  [name] DOE (dyspnea on exertion)    COPD + chronic diastolic HF  . Dysthymia    Worsened after 2015 heart surgery.  Memory loss +? as well.  Neuro eval 01/2017--plan is to get MRI brain, B12 level, and neuropsychiatric testing.  . Erectile dysfunction    Normal testosterone  . Hearing loss of both ears 2016   Hearing aids  . Hyperplastic colon polyp 2001   No  further colonoscopies to be done as of 11/2016 GI eval.  . Hypertension   . IBS (irritable bowel syndrome)    responds relatively well to prn levsin.  Pepto bismol q AM trial by Dr. Loletha Carrow 08/2017, also checking fecal elastace and lactoferrin.  . Iron deficiency anemia 2014   03/2012 capsule endoscopy showed 2 AVMs--likely responsible for his IDA--lifetime iron supp recommended + q48moCBCs.  . Ischemic cardiomyopathy  06/2016; 09/2016   06/2016: EF 25-30%, global hypokinesis, grd II DD, vent septum motion changes c/w LBBB, mod MV regurg, L atr mod/sev dilat, R atr mod dilat, mod incr pulm press, Grd II DD.  09/2016--EF 40-45%; diffuse hypokinesis, Grd I DD.  . Macular degeneration, dry    Mild, bilat (Optometrist, DMayford Knifeat [employer]  . Mild cognitive impairment    MCI, likley vascular etiology, + MDD, recurrent-->neuropsych testing 06/2017.  Neurol to repeat testing 1 yr  . Mixed hyperlipidemia    statin and vascepa as of 04/2017  . Nocturnal hypoxemia   . Obesity   . Open toe wound 11/2015   Wound care clinic appt made and then canceled when toe improved.  . Other and unspecified angina pectoris   . PAD (peripheral artery disease) ([place] 02/2016   Abnormal ABI's and waveforms: LE arterial duplex ordered for f/u as per cardiologist's recommendation.  Vasc eval by Dr. CBridgett Larsson impression was minimal PAD, recommended maximize med mgmt.  .Marland KitchenPAF (paroxysmal atrial fibrillation) ([place] 10/2014   xarelto  . Pericardial effusion with cardiac tamponade 6//29/15   pericardiocentesis was done,  Infectious/inflamm (cytology showed NO MALIGNANT CELLS)  . Pleural plaque    Pleural plaques/asbestosis changes on CT chest done by pulm 05/2016.  .Marland KitchenPneumonia   . Tobacco dependence in remission    100+ pack-yr hx: quit after CABG    Past Surgical History:  Procedure Laterality Date  . APPENDECTOMY  1957  . CARDIAC CATHETERIZATION  08/08/2013  . CATARACT EXTRACTION W/ INTRAOCULAR LENS  IMPLANT, BILATERAL  04/08/2006 & 04/22/2006  . CATARACT EXTRACTION W/ INTRAOCULAR LENS  IMPLANT, BILATERAL Bilateral   . CHOLECYSTECTOMY OPEN  1999  . COLONOSCOPY  10/16/2011   Procedure: COLONOSCOPY;  Surgeon: RInda Castle MD;  Location: [facility];  Service: Endoscopy;  Laterality: N/A;  . CORONARY ARTERY BYPASS GRAFT N/A 08/14/2013   Procedure: CORONARY ARTERY BYPASS GRAFTING (CABG);  Surgeon: SMelrose Nakayama MD;  [facility]  Service: Open Heart Surgery;  Laterality: N/A;  Times 4   using left internal mammary artery and endoscopically harvested bilateral saphenous vein  . ESOPHAGOGASTRODUODENOSCOPY  02/25/12   Atrophic gastritis with a few erosions--capsule endoscopy planned as of 02/25/12 (Dr. KDeatra Ina.  .Marland KitchenHARDWARE REMOVAL Right 08/12/2012   Procedure: HARDWARE REMOVAL;  Surgeon: CMcarthur Rossetti MD;  Location: [facility];  Service: Orthopedics;  Laterality: Right;  . HIP SURGERY Right 1954   Repair of slipped capital femoral epiphysis.  . INTRAOPERATIVE TRANSESOPHAGEAL ECHOCARDIOGRAM N/A 08/14/2013   Normal LV function. Procedure: INTRAOPERATIVE TRANSESOPHAGEAL ECHOCARDIOGRAM;  Surgeon: SMelrose Nakayama MD;  [facility]  Service: Open Heart Surgery;  Laterality: N/A;  . LEFT HEART CATHETERIZATION WITH CORONARY ANGIOGRAM N/A 08/08/2013   Procedure: LEFT HEART CATHETERIZATION WITH CORONARY ANGIOGRAM;  Surgeon: JJettie Booze MD;  [facility];  Service: Cardiovascular;  Laterality: N/A;  . PATELLA FRACTURE SURGERY Left ~ 1979   bolt + 3 screws to repair tib plateau fx  . PERICARDIAL TAP  N/A 07/01/2013   Procedure: PERICARDIAL TAP;  Surgeon: Jettie Booze, MD;  Location: [facility] CATH LAB;  Service: Cardiovascular;  Laterality: N/A;  . RIGHT/LEFT HEART CATH AND CORONARY ANGIOGRAPHY N/A 06/10/2016   Procedure: Right/Left Heart Cath and Coronary Angiography;  Surgeon: Larey Dresser, MD;  Location: [facility] CV LAB;  Service: Cardiovascular;  Laterality: N/A;  . TESTICLE SURGERY  as a child   Undescended testicle brought down into scrotum  . TONSILLECTOMY  1947  . TOTAL HIP ARTHROPLASTY Right 08/12/2012   Procedure: REMOVAL OF OLD PINS RIGHT HIP AND RIGHT TOTAL HIP ARTHROPLASTY ANTERIOR APPROACH;  Surgeon: Mcarthur Rossetti, MD;  Location: [facility];  Service: Orthopedics;  Laterality: Right;  . TRANSESOPHAGEAL ECHOCARDIOGRAM  09/22/2016   EF 40-45%; diffuse  hypokinesis, Grd I DD.  [name] TRANSTHORACIC ECHOCARDIOGRAM  10/30/14   Mod LVH, EF 60-65%, normal wall motion, mod mitral regurg, mild PAH  . TRANSTHORACIC ECHOCARDIOGRAM  06/2016; 10/01/17   06/2016: EF 25-30%, global hypokinesis, grd II DD, vent septum motion changes c/w LBBB, mod MV regurg, L atr mod/sev dilat, R atr mod dilat, mod incr pulm press, Grd II DD.  09/2017: EF 50-55%, Hypokinesis of the basal inferolateral wall, grd I DD, mild MR, +LAE.    Family History  Problem Relation Age of Onset  . Heart disease Father   . Heart attack Father   . CVA Mother   . Hypertension Mother   . Diabetes Paternal Grandmother   . Breast cancer Sister   . Diabetes Maternal Uncle   . Heart disease Maternal Uncle   . Stroke Neg Hx     Social History:  reports that he has been smoking cigarettes. He has a 64.00 pack-year smoking history. He quit smokeless tobacco use about 4 years ago. He reports that he does not drink alcohol or use drugs.   Review of Systems    Taking Remeron and Zoloft for depression  Lipid history: He had been taking Lipitor 40 mg Has had good control of LDL but he has increased triglycerides Has been prescribed Vascepa was refilled in August    Lab Results  Component Value Date   CHOL 103 12/16/2017   HDL 40.40 12/16/2017   LDLCALC 42 12/16/2017   LDLDIRECT 47.0 04/26/2017   TRIG 105.0 12/16/2017   CHOLHDL 3 12/16/2017           Hypertension:Mild and controlled with bisoprolol 5 mg,25 mg losartan He is on Demadex for CHF  Followed by cardiologist   BP Readings from Last 3 Encounters:  03/29/18 (!) 122/58  03/21/18 (!) 132/49  02/24/18 (!) 122/58   No recent renal dysfunction or hypokalemia  Lab Results  Component Value Date   CREATININE 0.96 02/24/2018   CREATININE 0.96 01/11/2018   CREATININE 0.99 01/04/2018    Lab Results  Component Value Date   K 4.1 02/24/2018     Most recent eye exam was 02/2018, has had no retinopathy  No recent numbness  in the feet    LABS:    Physical Examination:  BP (!) 122/58 (BP Location: Left Arm, Patient Position: Sitting, Cuff Size: Normal)   Pulse 73   Temp 98.6 F (37 C) (Oral)   Ht 5' 11" (1.803 m)   Wt 240 lb 9.6 oz (109.1 kg)   SpO2 (!) 84%   BMI 33.56 kg/m   Diabetic Foot Exam - Simple   Simple Foot Form Diabetic Foot exam was performed with the following findings:  Yes 03/29/2018  10:10 AM  Visual Inspection No deformities, no ulcerations, no other skin breakdown bilaterally:  Yes Sensation Testing Intact to touch and monofilament testing bilaterally:  Yes Pulse Check See comments:  Yes Comments Absent pedal pulses bilaterally        ASSESSMENT:  Diabetes type 2, uncontrolled, on U-500 insulin  See history of present illness for detailed discussion of current diabetes management, blood sugar patterns and problems identified  His A1c is still the same  at 6.8    He has been taking U-500, however not following instructions for the doses  His blood sugars are tending to be low fasting and sometimes overnight This is because of his taking arbitrary doses of insulin in the evenings and also another dose at bedtime even though he was not advised to do this His compliance with insulin is also inconsistent as discussed above Also may occasionally forget his Synjardy Timing of insulin is still not adequate with not taking it before eating periodically and not clear how often   Foot exam does not show any sensory loss today but decreased pulses  COPD and CHF: Stable  PLAN:    Discussed that his insulin needs to be taken only before meals to control postprandial hyperglycemia and not at bedtime He will stop the bedtime dose He needs to make sure he takes his insulin before eating consistently up to 30 minutes before However if he forgets the insulin at mealtimes he will take it only if within 15 minutes of the meal He needs to call in 10 days to report his blood sugars  for review again However if he continues to have any low sugars he will need to call Discussed action profile of the Humulin R insulin as well as blood sugar targets at various time He needs to start back on testing his blood sugars after meals and discussed postprandial targets He can still look into the freestyle libre system as previously was checking sugars 4 times a day  Also for now we will reduce his suppertime dose down to 8 units along with lunchtime dose also because of his variable appetite     Patient Instructions  INSULIN 10 IN AM AND 8 BEFORE LUNCH and 8 before supper, no shot if over 15 min. After the meal  Check blood sugars on waking up days a week  Also check blood sugars about 2 hours after meals and do this after different meals by rotation  Recommended blood sugar levels on waking up are 90-130 and about 2 hours after meal is 130-160  Please bring your blood sugar monitor to each visit, thank you  Call sugars in 10 days    Counseling time on subjects discussed in assessment and plan sections is over 50% of today's 25 minute visit    Elayne Snare 03/29/2018, 11:21 AM   Note: This office note was prepared with Dragon voice recognition system technology. Any transcriptional errors that result from this process are unintentional.    Elayne Snare

## 2018-03-30 ENCOUNTER — Telehealth: Payer: Self-pay | Admitting: Pulmonary Disease

## 2018-03-30 NOTE — Telephone Encounter (Signed)
Returned phone call to patient wife, patient is having increased SOB, wears 2L of oxygen at all times, productive cough with cloudy mucous, denies fever. States symptoms started 5 days ago and has slowly gotten worse. Denies travel and states he has not been around sick. States his oxygen is staying at about 88% on 2L. States he has been using his nebulizer twice daily for 5 days and it has not helped. Uses his symbicort daily.  Denies taking anything OTC to help with symptoms. States they live with someone who takes cellcept and they are very afraid of getting that family member sick. Requesting something to be called in. States oxygen only desats with movement, at rest he remains at 93% and higher.   TN please advise. Thanks.

## 2018-03-30 NOTE — Telephone Encounter (Signed)
ATC pt's wife, received a message stating that the caller can't accept my call at this time. Will try back.

## 2018-03-30 NOTE — Telephone Encounter (Signed)
Please make a televisit for patient with one of the APPs. Thanks.

## 2018-03-31 ENCOUNTER — Other Ambulatory Visit: Payer: Self-pay

## 2018-03-31 ENCOUNTER — Encounter: Payer: Self-pay | Admitting: Nurse Practitioner

## 2018-03-31 ENCOUNTER — Ambulatory Visit (INDEPENDENT_AMBULATORY_CARE_PROVIDER_SITE_OTHER): Payer: Medicare Other | Admitting: Nurse Practitioner

## 2018-03-31 DIAGNOSIS — J441 Chronic obstructive pulmonary disease with (acute) exacerbation: Secondary | ICD-10-CM | POA: Diagnosis not present

## 2018-03-31 DIAGNOSIS — R0602 Shortness of breath: Secondary | ICD-10-CM

## 2018-03-31 MED ORDER — PREDNISONE 10 MG PO TABS: 20.0000 mg | ORAL_TABLET | Freq: Every day | ORAL | 0 refills | Status: DC

## 2018-03-31 NOTE — Progress Notes (Signed)
Virtual Visit via Video Note  I connected with Joshua [name] on 03/31/18 at 11:00 AM EDT by a video enabled telemedicine application and verified that I am speaking with the correct person using two identifiers.   I discussed the limitations of evaluation and management by telemedicine and the availability of in person appointments. The patient expressed understanding and agreed to proceed.  History of Present Illness: Joshua [name] is a 78 y.o. male with PMH of chronic diastolic CHF, COPD on home 02, COPD, history of asbestos exposure, HTN, HLD, and DM. He is followed by Dr. Vaughan Browner.  Patient was scheduled a tele-visit today for shortness of breath.  Patient was last seen in this office by Eric Form on 06/24/2016.  He states that his PCP has been managing his COPD.  He states that the shortness of breath has been going on for the past 3 days.  He has been seen by his PCP since the symptoms started.  He saw his PCP on 03/29/2018.  His O2 sats at that visit were 84%.  He is on 2 L O2 continuously.  Patient denies any cough or wheezing.  Patient denies any lower extremity edema.  Patient states that he has not been using his inhalers.  He has not used his albuterol as needed.  He is supposed to be using Symbicort, but states that he has not been using this.  Denies f/c/s, n/v/d, hemoptysis, PND, leg swelling.     Observations/Objective: CXR 01/11/18 - Stable cardiomegaly. No present CHF, pneumonia, or pleural effusion. Pleural plaques again are noted right greater than left. Echo (6/3): EF 25-30%, severe hypokinesis, moderate MR, mildly dilated RV with mildly decreased systolic function, PASP 48 mmHg.   [facility] 06/10/16 showed mildly elevated left/right filling pressures, preserved cardiac output, and severe 3 vessel native disease with patent bypass grafts. No interventional target.   PFT Results Latest Ref Rng & Units 08/09/2013  FVC-Pre L 3.29  FVC-Predicted Pre % 74  FVC-Post L 3.47  FVC-Predicted  Post % 78  Pre FEV1/FVC % % 65  Post FEV1/FCV % % 67  FEV1-Pre L 2.12  FEV1-Predicted Pre % 65  FEV1-Post L 2.32  DLCO UNC% % 71  DLCO COR %Predicted % 98  TLC L 5.73  TLC % Predicted % 79  RV % Predicted % 88     Assessment and Plan: Patient was scheduled a tele-visit today for shortness of breath.  Patient was last seen in this office by Eric Form on 06/24/2016.  He states that his PCP has been managing his COPD.  He states that the shortness of breath has been going on for the past 3 days.  He has been seen by his PCP since the symptoms started.  He saw his PCP on 03/29/2018.  His O2 sats at that visit were 84%.  He is on 2 L O2 continuously.  Patient denies any cough or wheezing.  Patient denies any lower extremity edema.  Patient states that he has not been using his inhalers.  He has not used his albuterol as needed.  He is supposed to be using Symbicort, but states that he has not been using this.  Patient Instructions  Please restart symbicort 2 puffs twice daily Please restart albuterol neb treatments every 6 hours as needed  Continue Flonase Will order prednisone - please keep close check on blood sugars    Follow Up Instructions:    I discussed the assessment and treatment plan with the patient.  The patient was provided an opportunity to ask questions and all were answered. The patient agreed with the plan and demonstrated an understanding of the instructions.   The patient was advised to call back or seek an in-person evaluation if the symptoms worsen or if the condition fails to improve as anticipated.  I provided 22 minutes of non-face-to-face time during this encounter.   Fenton Foy, NP

## 2018-03-31 NOTE — Patient Instructions (Addendum)
Please restart symbicort 2 puffs twice daily Please restart albuterol neb treatments every 6 hours as needed  Continue Flonase Will order prednisone - please keep close check on blood sugars  Follow up with Dr. Vaughan Browner in 4 months or sooner if needed If symptoms worsen please go to the ED  Coronavirus (COVID-19) Are you at risk?  Are you at risk for the Coronavirus (COVID-19)?  To be considered HIGH RISK for Coronavirus (COVID-19), you have to meet the following criteria:  . Traveled to [place], [place], [place], [place] or [place]; or in the [place] to [place], [place], [place], or [place]; and have fever, cough, and shortness of breath within the last 2 weeks of travel OR . Been in close contact with a person diagnosed with COVID-19 within the last 2 weeks and have fever, cough, and shortness of breath . IF YOU DO NOT MEET THESE CRITERIA, YOU ARE CONSIDERED LOW RISK FOR COVID-19.  What to do if you are HIGH RISK for COVID-19?  [name] If you are having a medical emergency, call 911. . Seek medical care right away. Before you go to a doctor's office, urgent care or emergency department, call ahead and tell them about your recent travel, contact with someone diagnosed with COVID-19, and your symptoms. You should receive instructions from your physician's office regarding next steps of care.  . When you arrive at healthcare provider, tell the healthcare staff immediately you have returned from visiting [place], [place], [place], [place] or [place]; or traveled in the [place] to [place], [place], [place], or [place]; in the last two weeks or you have been in close contact with a person diagnosed with COVID-19 in the last 2 weeks.   . Tell the health care staff about your symptoms: fever, cough and shortness of breath. . After you have been seen by a medical provider, you will be either: o Tested for (COVID-19) and discharged home on quarantine except to seek medical care if  symptoms worsen, and asked to  - Stay home and avoid contact with others until you get your results (4-5 days)  - Avoid travel on public transportation if possible (such as bus, train, or airplane) or o Sent to the Emergency Department by EMS for evaluation, COVID-19 testing, and possible admission depending on your condition and test results.  What to do if you are LOW RISK for COVID-19?  Reduce your risk of any infection by using the same precautions used for avoiding the common cold or flu:  [name] Wash your hands often with soap and warm water for at least 20 seconds.  If soap and water are not readily available, use an alcohol-based hand sanitizer with at least 60% alcohol.  . If coughing or sneezing, cover your mouth and nose by coughing or sneezing into the elbow areas of your shirt or coat, into a tissue or into your sleeve (not your hands). . Avoid shaking hands with others and consider head nods or verbal greetings only. . Avoid touching your eyes, nose, or mouth with unwashed hands.  . Avoid close contact with people who are sick. . Avoid places or events with large numbers of people in one location, like concerts or sporting events. . Carefully consider travel plans you have or are making. . If you are planning any travel outside or inside the [place], visit the CDC's Travelers' Health webpage for the latest health notices. . If you have some symptoms but not all symptoms,  continue to monitor at home and seek medical attention if your symptoms worsen. . If you are having a medical emergency, call 911.   [place] / e-Visit: [email or web address]         [facility]: [facility]: [phone]                   [facility]: [phone]

## 2018-03-31 NOTE — Telephone Encounter (Signed)
Spoke with pt's wife. She is aware of Tonya's recommendation. Pt has been scheduled for a televisit with Tonya today at 11am. Nothing further was needed at this time.

## 2018-03-31 NOTE — Assessment & Plan Note (Signed)
Patient was scheduled a tele-visit today for shortness of breath.  Patient was last seen in this office by Eric Form on 06/24/2016.  He states that his PCP has been managing his COPD.  He states that the shortness of breath has been going on for the past 3 days.  He has been seen by his PCP since the symptoms started.  He saw his PCP on 03/29/2018.  His O2 sats at that visit were 84%.  He is on 2 L O2 continuously.  Patient denies any cough or wheezing.  Patient denies any lower extremity edema.  Patient states that he has not been using his inhalers.  He has not used his albuterol as needed.  He is supposed to be using Symbicort, but states that he has not been using this.  Patient Instructions  Please restart symbicort 2 puffs twice daily Please restart albuterol neb treatments every 6 hours as needed  Continue Flonase Will order prednisone - please keep close check on blood sugars  Follow up with Dr. Vaughan Browner in 4 months or sooner if needed If symptoms worsen please go to the ED  Coronavirus (COVID-19) Are you at risk?  Are you at risk for the Coronavirus (COVID-19)?  To be considered HIGH RISK for Coronavirus (COVID-19), you have to meet the following criteria:  . Traveled to [place], [place], [place], [place] or [place]; or in the [place] to [place], [place], [place], or [place]; and have fever, cough, and shortness of breath within the last 2 weeks of travel OR . Been in close contact with a person diagnosed with COVID-19 within the last 2 weeks and have fever, cough, and shortness of breath . IF YOU DO NOT MEET THESE CRITERIA, YOU ARE CONSIDERED LOW RISK FOR COVID-19.  What to do if you are HIGH RISK for COVID-19?  [name] If you are having a medical emergency, call 911. . Seek medical care right away. Before you go to a doctor's office, urgent care or emergency department, call ahead and tell them about your recent travel, contact with someone diagnosed with COVID-19, and your  symptoms. You should receive instructions from your physician's office regarding next steps of care.  . When you arrive at healthcare provider, tell the healthcare staff immediately you have returned from visiting [place], [place], [place], [place] or [place]; or traveled in the [place] to [place], [place], [place], or [place]; in the last two weeks or you have been in close contact with a person diagnosed with COVID-19 in the last 2 weeks.   . Tell the health care staff about your symptoms: fever, cough and shortness of breath. . After you have been seen by a medical provider, you will be either: o Tested for (COVID-19) and discharged home on quarantine except to seek medical care if symptoms worsen, and asked to  - Stay home and avoid contact with others until you get your results (4-5 days)  - Avoid travel on public transportation if possible (such as bus, train, or airplane) or o Sent to the Emergency Department by EMS for evaluation, COVID-19 testing, and possible admission depending on your condition and test results.  What to do if you are LOW RISK for COVID-19?  Reduce your risk of any infection by using the same precautions used for avoiding the common cold or flu:  [name] Wash your hands often with soap and warm water for at least 20 seconds.  If soap and water are not  readily available, use an alcohol-based hand sanitizer with at least 60% alcohol.  . If coughing or sneezing, cover your mouth and nose by coughing or sneezing into the elbow areas of your shirt or coat, into a tissue or into your sleeve (not your hands). . Avoid shaking hands with others and consider head nods or verbal greetings only. . Avoid touching your eyes, nose, or mouth with unwashed hands.  . Avoid close contact with people who are sick. . Avoid places or events with large numbers of people in one location, like concerts or sporting events. . Carefully consider travel plans you have or are making. . If you  are planning any travel outside or inside the [place], visit the CDC's Travelers' Health webpage for the latest health notices. . If you have some symptoms but not all symptoms, continue to monitor at home and seek medical attention if your symptoms worsen. . If you are having a medical emergency, call 911.   [place] / e-Visit: [email or web address]         [facility]: [facility]: [phone]                   [facility]: [phone]

## 2018-04-01 ENCOUNTER — Emergency Department (HOSPITAL_COMMUNITY): Payer: Medicare Other

## 2018-04-01 ENCOUNTER — Inpatient Hospital Stay (HOSPITAL_COMMUNITY)
Admission: EM | Admit: 2018-04-01 | Discharge: 2018-04-06 | DRG: 193 | Disposition: E | Payer: Medicare Other | Attending: Internal Medicine | Admitting: Internal Medicine

## 2018-04-01 ENCOUNTER — Telehealth: Payer: Self-pay | Admitting: Family Medicine

## 2018-04-01 ENCOUNTER — Encounter (HOSPITAL_COMMUNITY): Payer: Self-pay

## 2018-04-01 ENCOUNTER — Other Ambulatory Visit: Payer: Self-pay

## 2018-04-01 ENCOUNTER — Encounter (HOSPITAL_COMMUNITY): Payer: Self-pay | Admitting: Cardiology

## 2018-04-01 DIAGNOSIS — E118 Type 2 diabetes mellitus with unspecified complications: Secondary | ICD-10-CM | POA: Diagnosis present

## 2018-04-01 DIAGNOSIS — E669 Obesity, unspecified: Secondary | ICD-10-CM | POA: Diagnosis present

## 2018-04-01 DIAGNOSIS — E1122 Type 2 diabetes mellitus with diabetic chronic kidney disease: Secondary | ICD-10-CM | POA: Diagnosis not present

## 2018-04-01 DIAGNOSIS — I480 Paroxysmal atrial fibrillation: Secondary | ICD-10-CM | POA: Diagnosis not present

## 2018-04-01 DIAGNOSIS — I2510 Atherosclerotic heart disease of native coronary artery without angina pectoris: Secondary | ICD-10-CM | POA: Diagnosis present

## 2018-04-01 DIAGNOSIS — I447 Left bundle-branch block, unspecified: Secondary | ICD-10-CM | POA: Diagnosis present

## 2018-04-01 DIAGNOSIS — N183 Chronic kidney disease, stage 3 unspecified: Secondary | ICD-10-CM | POA: Diagnosis present

## 2018-04-01 DIAGNOSIS — Z951 Presence of aortocoronary bypass graft: Secondary | ICD-10-CM

## 2018-04-01 DIAGNOSIS — Z9841 Cataract extraction status, right eye: Secondary | ICD-10-CM | POA: Diagnosis not present

## 2018-04-01 DIAGNOSIS — Z79899 Other long term (current) drug therapy: Secondary | ICD-10-CM

## 2018-04-01 DIAGNOSIS — J441 Chronic obstructive pulmonary disease with (acute) exacerbation: Secondary | ICD-10-CM | POA: Diagnosis not present

## 2018-04-01 DIAGNOSIS — R0602 Shortness of breath: Secondary | ICD-10-CM | POA: Diagnosis not present

## 2018-04-01 DIAGNOSIS — Z7951 Long term (current) use of inhaled steroids: Secondary | ICD-10-CM

## 2018-04-01 DIAGNOSIS — Z7952 Long term (current) use of systemic steroids: Secondary | ICD-10-CM

## 2018-04-01 DIAGNOSIS — I959 Hypotension, unspecified: Secondary | ICD-10-CM | POA: Diagnosis not present

## 2018-04-01 DIAGNOSIS — Z888 Allergy status to other drugs, medicaments and biological substances status: Secondary | ICD-10-CM

## 2018-04-01 DIAGNOSIS — E1151 Type 2 diabetes mellitus with diabetic peripheral angiopathy without gangrene: Secondary | ICD-10-CM | POA: Diagnosis not present

## 2018-04-01 DIAGNOSIS — Z803 Family history of malignant neoplasm of breast: Secondary | ICD-10-CM

## 2018-04-01 DIAGNOSIS — I10 Essential (primary) hypertension: Secondary | ICD-10-CM | POA: Diagnosis not present

## 2018-04-01 DIAGNOSIS — B9729 Other coronavirus as the cause of diseases classified elsewhere: Secondary | ICD-10-CM | POA: Diagnosis present

## 2018-04-01 DIAGNOSIS — J9601 Acute respiratory failure with hypoxia: Secondary | ICD-10-CM | POA: Diagnosis not present

## 2018-04-01 DIAGNOSIS — Z974 Presence of external hearing-aid: Secondary | ICD-10-CM | POA: Diagnosis not present

## 2018-04-01 DIAGNOSIS — J449 Chronic obstructive pulmonary disease, unspecified: Secondary | ICD-10-CM | POA: Diagnosis present

## 2018-04-01 DIAGNOSIS — F17210 Nicotine dependence, cigarettes, uncomplicated: Secondary | ICD-10-CM | POA: Diagnosis present

## 2018-04-01 DIAGNOSIS — F0390 Unspecified dementia without behavioral disturbance: Secondary | ICD-10-CM | POA: Diagnosis present

## 2018-04-01 DIAGNOSIS — I130 Hypertensive heart and chronic kidney disease with heart failure and stage 1 through stage 4 chronic kidney disease, or unspecified chronic kidney disease: Secondary | ICD-10-CM | POA: Diagnosis not present

## 2018-04-01 DIAGNOSIS — R062 Wheezing: Secondary | ICD-10-CM | POA: Diagnosis not present

## 2018-04-01 DIAGNOSIS — R000 Tachycardia, unspecified: Secondary | ICD-10-CM | POA: Diagnosis not present

## 2018-04-01 DIAGNOSIS — J61 Pneumoconiosis due to asbestos and other mineral fibers: Secondary | ICD-10-CM | POA: Diagnosis present

## 2018-04-01 DIAGNOSIS — F17200 Nicotine dependence, unspecified, uncomplicated: Secondary | ICD-10-CM | POA: Diagnosis not present

## 2018-04-01 DIAGNOSIS — Z9641 Presence of insulin pump (external) (internal): Secondary | ICD-10-CM | POA: Diagnosis present

## 2018-04-01 DIAGNOSIS — J9621 Acute and chronic respiratory failure with hypoxia: Secondary | ICD-10-CM | POA: Diagnosis not present

## 2018-04-01 DIAGNOSIS — Z8249 Family history of ischemic heart disease and other diseases of the circulatory system: Secondary | ICD-10-CM

## 2018-04-01 DIAGNOSIS — Z7901 Long term (current) use of anticoagulants: Secondary | ICD-10-CM

## 2018-04-01 DIAGNOSIS — R0689 Other abnormalities of breathing: Secondary | ICD-10-CM | POA: Diagnosis not present

## 2018-04-01 DIAGNOSIS — Z9842 Cataract extraction status, left eye: Secondary | ICD-10-CM

## 2018-04-01 DIAGNOSIS — H9193 Unspecified hearing loss, bilateral: Secondary | ICD-10-CM | POA: Diagnosis not present

## 2018-04-01 DIAGNOSIS — Z716 Tobacco abuse counseling: Secondary | ICD-10-CM | POA: Diagnosis not present

## 2018-04-01 DIAGNOSIS — Z66 Do not resuscitate: Secondary | ICD-10-CM | POA: Diagnosis present

## 2018-04-01 DIAGNOSIS — Z9049 Acquired absence of other specified parts of digestive tract: Secondary | ICD-10-CM

## 2018-04-01 DIAGNOSIS — J189 Pneumonia, unspecified organism: Secondary | ICD-10-CM | POA: Diagnosis not present

## 2018-04-01 DIAGNOSIS — H3530 Unspecified macular degeneration: Secondary | ICD-10-CM | POA: Diagnosis present

## 2018-04-01 DIAGNOSIS — Z794 Long term (current) use of insulin: Secondary | ICD-10-CM

## 2018-04-01 DIAGNOSIS — I255 Ischemic cardiomyopathy: Secondary | ICD-10-CM | POA: Diagnosis present

## 2018-04-01 DIAGNOSIS — J1289 Other viral pneumonia: Secondary | ICD-10-CM | POA: Diagnosis not present

## 2018-04-01 DIAGNOSIS — J440 Chronic obstructive pulmonary disease with acute lower respiratory infection: Secondary | ICD-10-CM | POA: Diagnosis not present

## 2018-04-01 DIAGNOSIS — Z961 Presence of intraocular lens: Secondary | ICD-10-CM | POA: Diagnosis present

## 2018-04-01 DIAGNOSIS — I5043 Acute on chronic combined systolic (congestive) and diastolic (congestive) heart failure: Secondary | ICD-10-CM | POA: Diagnosis not present

## 2018-04-01 DIAGNOSIS — J438 Other emphysema: Secondary | ICD-10-CM | POA: Diagnosis not present

## 2018-04-01 DIAGNOSIS — Z885 Allergy status to narcotic agent status: Secondary | ICD-10-CM

## 2018-04-01 DIAGNOSIS — Z79891 Long term (current) use of opiate analgesic: Secondary | ICD-10-CM

## 2018-04-01 DIAGNOSIS — Z6832 Body mass index (BMI) 32.0-32.9, adult: Secondary | ICD-10-CM

## 2018-04-01 DIAGNOSIS — Z823 Family history of stroke: Secondary | ICD-10-CM

## 2018-04-01 DIAGNOSIS — Z833 Family history of diabetes mellitus: Secondary | ICD-10-CM

## 2018-04-01 DIAGNOSIS — E782 Mixed hyperlipidemia: Secondary | ICD-10-CM | POA: Diagnosis not present

## 2018-04-01 DIAGNOSIS — Z9981 Dependence on supplemental oxygen: Secondary | ICD-10-CM | POA: Diagnosis not present

## 2018-04-01 DIAGNOSIS — Z86010 Personal history of colonic polyps: Secondary | ICD-10-CM | POA: Diagnosis not present

## 2018-04-01 DIAGNOSIS — Z96641 Presence of right artificial hip joint: Secondary | ICD-10-CM | POA: Diagnosis present

## 2018-04-01 LAB — COMPREHENSIVE METABOLIC PANEL
ALT: 32 U/L (ref 0–44)
AST: 45 U/L — ABNORMAL HIGH (ref 15–41)
Albumin: 3.1 g/dL — ABNORMAL LOW (ref 3.5–5.0)
Alkaline Phosphatase: 75 U/L (ref 38–126)
Anion gap: 16 — ABNORMAL HIGH (ref 5–15)
BUN: 33 mg/dL — ABNORMAL HIGH (ref 8–23)
CO2: 24 mmol/L (ref 22–32)
Calcium: 8.6 mg/dL — ABNORMAL LOW (ref 8.9–10.3)
Chloride: 101 mmol/L (ref 98–111)
Creatinine, Ser: 1.23 mg/dL (ref 0.61–1.24)
GFR calc non Af Amer: 56 mL/min — ABNORMAL LOW (ref >60)
Glucose, Bld: 167 mg/dL — ABNORMAL HIGH (ref 70–99)
Potassium: 3.5 mmol/L (ref 3.5–5.1)
SODIUM: 141 mmol/L (ref 135–145)
Total Bilirubin: 1 mg/dL (ref 0.3–1.2)
Total Protein: 7.2 g/dL (ref 6.5–8.1)

## 2018-04-01 LAB — CBC WITH DIFFERENTIAL/PLATELET
Abs Immature Granulocytes: 0.04 10*3/uL (ref 0.00–0.07)
Basophils Absolute: 0 10*3/uL (ref 0.0–0.1)
Basophils Relative: 0 %
Eosinophils Absolute: 0.2 10*3/uL (ref 0.0–0.5)
Eosinophils Relative: 2 %
HCT: 40.3 % (ref 39.0–52.0)
Hemoglobin: 12.1 g/dL — ABNORMAL LOW (ref 13.0–17.0)
Immature Granulocytes: 1 %
Lymphocytes Relative: 6 %
Lymphs Abs: 0.5 10*3/uL — ABNORMAL LOW (ref 0.7–4.0)
MCH: 26.7 pg (ref 26.0–34.0)
MCHC: 30 g/dL (ref 30.0–36.0)
MCV: 88.8 fL (ref 80.0–100.0)
Monocytes Absolute: 0.6 10*3/uL (ref 0.1–1.0)
Monocytes Relative: 8 %
Neutro Abs: 6.5 10*3/uL (ref 1.7–7.7)
Neutrophils Relative %: 83 %
Platelets: 204 10*3/uL (ref 150–400)
RBC: 4.54 MIL/uL (ref 4.22–5.81)
RDW: 16.6 % — AB (ref 11.5–15.5)
WBC: 7.8 10*3/uL (ref 4.0–10.5)
nRBC: 0 % (ref 0.0–0.2)

## 2018-04-01 LAB — SEDIMENTATION RATE: Sed Rate: 122 mm/hr — ABNORMAL HIGH (ref 0–16)

## 2018-04-01 LAB — URINALYSIS, ROUTINE W REFLEX MICROSCOPIC
Bilirubin Urine: NEGATIVE
Glucose, UA: >=500 mg/dL — AB
Ketones, ur: 5 mg/dL — AB
Nitrite: NEGATIVE
PROTEIN: 30 mg/dL — AB
Specific Gravity, Urine: 1.014 (ref 1.005–1.030)
pH: 5 (ref 5.0–8.0)

## 2018-04-01 LAB — BLOOD GAS, ARTERIAL
Acid-Base Excess: 0 mmol/L (ref 0.0–2.0)
Bicarbonate: 26.1 mmol/L (ref 20.0–28.0)
Drawn by: 103701
FIO2: 100
O2 Saturation: 96.9 %
PO2 ART: 110 mmHg — AB (ref 83.0–108.0)
Patient temperature: 98.6
pCO2 arterial: 50.9 mmHg — ABNORMAL HIGH (ref 32.0–48.0)
pH, Arterial: 7.33 — ABNORMAL LOW (ref 7.350–7.450)

## 2018-04-01 LAB — BRAIN NATRIURETIC PEPTIDE: B Natriuretic Peptide: 1338.8 pg/mL — ABNORMAL HIGH (ref 0.0–100.0)

## 2018-04-01 LAB — FERRITIN: Ferritin: 424 ng/mL — ABNORMAL HIGH (ref 24–336)

## 2018-04-01 LAB — LACTATE DEHYDROGENASE: LDH: 193 U/L — ABNORMAL HIGH (ref 98–192)

## 2018-04-01 LAB — PROCALCITONIN: Procalcitonin: 0.7 ng/mL

## 2018-04-01 LAB — D-DIMER, QUANTITATIVE: D-Dimer, Quant: 1.92 ug/mL-FEU — ABNORMAL HIGH (ref 0.00–0.50)

## 2018-04-01 LAB — CBG MONITORING, ED: Glucose-Capillary: 326 mg/dL — ABNORMAL HIGH (ref 70–99)

## 2018-04-01 LAB — LACTIC ACID, PLASMA
Lactic Acid, Venous: 1.2 mmol/L (ref 0.5–1.9)
Lactic Acid, Venous: 0.8 mmol/L (ref 0.5–1.9)

## 2018-04-01 MED ORDER — SODIUM CHLORIDE 0.9 % IV SOLN
2.0000 g | Freq: Three times a day (TID) | INTRAVENOUS | Status: DC
  Administered 2018-04-02 – 2018-04-03 (×5): 2 g via INTRAVENOUS
  Filled 2018-04-01 (×6): qty 2

## 2018-04-01 MED ORDER — BISOPROLOL FUMARATE 5 MG PO TABS
5.0000 mg | ORAL_TABLET | Freq: Every day | ORAL | Status: DC
  Administered 2018-04-02 – 2018-04-03 (×2): 5 mg via ORAL
  Filled 2018-04-01 (×2): qty 1

## 2018-04-01 MED ORDER — OMEGA-3-ACID ETHYL ESTERS 1 G PO CAPS
1.0000 g | ORAL_CAPSULE | Freq: Two times a day (BID) | ORAL | Status: DC
  Administered 2018-04-01 – 2018-04-03 (×4): 1 g via ORAL
  Filled 2018-04-01 (×5): qty 1

## 2018-04-01 MED ORDER — ONDANSETRON HCL 4 MG/2ML IJ SOLN: 4.0000 mg | Freq: Four times a day (QID) | INTRAMUSCULAR | Status: DC | PRN

## 2018-04-01 MED ORDER — VANCOMYCIN HCL 10 G IV SOLR
2000.0000 mg | Freq: Once | INTRAVENOUS | Status: AC
  Administered 2018-04-01: 2000 mg via INTRAVENOUS
  Filled 2018-04-01: qty 2000

## 2018-04-01 MED ORDER — MOMETASONE FURO-FORMOTEROL FUM 200-5 MCG/ACT IN AERO
2.0000 | INHALATION_SPRAY | Freq: Two times a day (BID) | RESPIRATORY_TRACT | Status: DC
  Administered 2018-04-01 – 2018-04-03 (×4): 2 via RESPIRATORY_TRACT
  Filled 2018-04-01: qty 8.8

## 2018-04-01 MED ORDER — ATORVASTATIN CALCIUM 40 MG PO TABS
40.0000 mg | ORAL_TABLET | Freq: Every day | ORAL | Status: DC
  Administered 2018-04-02: 40 mg via ORAL
  Filled 2018-04-01: qty 1

## 2018-04-01 MED ORDER — IPRATROPIUM-ALBUTEROL 20-100 MCG/ACT IN AERS
1.0000 | INHALATION_SPRAY | Freq: Four times a day (QID) | RESPIRATORY_TRACT | Status: DC
  Administered 2018-04-02 – 2018-04-03 (×5): 1 via RESPIRATORY_TRACT
  Filled 2018-04-01: qty 4

## 2018-04-01 MED ORDER — BISACODYL 5 MG PO TBEC: 5.0000 mg | DELAYED_RELEASE_TABLET | Freq: Every day | ORAL | Status: DC | PRN

## 2018-04-01 MED ORDER — INSULIN REGULAR HUMAN (CONC) 500 UNIT/ML ~~LOC~~ SOPN
10.0000 [IU] | PEN_INJECTOR | Freq: Three times a day (TID) | SUBCUTANEOUS | Status: DC
  Administered 2018-04-02 – 2018-04-03 (×4): 10 [IU] via SUBCUTANEOUS
  Filled 2018-04-01: qty 3

## 2018-04-01 MED ORDER — TAMSULOSIN HCL 0.4 MG PO CAPS
0.4000 mg | ORAL_CAPSULE | Freq: Every day | ORAL | Status: DC
  Administered 2018-04-01 – 2018-04-02 (×2): 0.4 mg via ORAL
  Filled 2018-04-01 (×2): qty 1

## 2018-04-01 MED ORDER — POLYETHYLENE GLYCOL 3350 17 G PO PACK
17.0000 g | PACK | Freq: Every day | ORAL | Status: DC | PRN
  Administered 2018-04-02: 17 g via ORAL
  Filled 2018-04-01: qty 1

## 2018-04-01 MED ORDER — VANCOMYCIN HCL 10 G IV SOLR: 1750.0000 mg | INTRAVENOUS | Status: DC

## 2018-04-01 MED ORDER — ALBUTEROL SULFATE HFA 108 (90 BASE) MCG/ACT IN AERS
2.0000 | INHALATION_SPRAY | RESPIRATORY_TRACT | Status: DC | PRN
  Administered 2018-04-02 (×2): 2 via RESPIRATORY_TRACT
  Administered 2018-04-02: 3 via RESPIRATORY_TRACT
  Administered 2018-04-02 (×2): 2 via RESPIRATORY_TRACT
  Administered 2018-04-03: 3 via RESPIRATORY_TRACT
  Administered 2018-04-03 (×2): 4 via RESPIRATORY_TRACT
  Filled 2018-04-01: qty 6.7

## 2018-04-01 MED ORDER — ONDANSETRON HCL 4 MG PO TABS: 4.0000 mg | ORAL_TABLET | Freq: Four times a day (QID) | ORAL | Status: DC | PRN

## 2018-04-01 MED ORDER — RIVAROXABAN 20 MG PO TABS
20.0000 mg | ORAL_TABLET | Freq: Every day | ORAL | Status: DC
  Administered 2018-04-01 – 2018-04-02 (×2): 20 mg via ORAL
  Filled 2018-04-01 (×3): qty 1

## 2018-04-01 MED ORDER — INSULIN REGULAR HUMAN (CONC) 500 UNIT/ML ~~LOC~~ SOLN: 8.0000 [IU] | Freq: Three times a day (TID) | SUBCUTANEOUS | Status: DC

## 2018-04-01 MED ORDER — SODIUM CHLORIDE 0.9% FLUSH
3.0000 mL | Freq: Two times a day (BID) | INTRAVENOUS | Status: DC
  Administered 2018-04-01 – 2018-04-03 (×5): 3 mL via INTRAVENOUS

## 2018-04-01 MED ORDER — ALBUTEROL SULFATE HFA 108 (90 BASE) MCG/ACT IN AERS
8.0000 | INHALATION_SPRAY | Freq: Once | RESPIRATORY_TRACT | Status: AC
  Administered 2018-04-01: 8 via RESPIRATORY_TRACT
  Filled 2018-04-01: qty 6.7

## 2018-04-01 MED ORDER — SODIUM CHLORIDE 0.9 % IV SOLN
1.0000 g | Freq: Once | INTRAVENOUS | Status: AC
  Administered 2018-04-01: 1 g via INTRAVENOUS
  Filled 2018-04-01: qty 10

## 2018-04-01 MED ORDER — MIRTAZAPINE 15 MG PO TABS
45.0000 mg | ORAL_TABLET | Freq: Every day | ORAL | Status: DC
  Administered 2018-04-01 – 2018-04-02 (×2): 45 mg via ORAL
  Filled 2018-04-01 (×2): qty 3

## 2018-04-01 MED ORDER — SODIUM CHLORIDE 0.9 % IV SOLN
500.0000 mg | Freq: Once | INTRAVENOUS | Status: AC
  Administered 2018-04-01: 500 mg via INTRAVENOUS
  Filled 2018-04-01: qty 500

## 2018-04-01 MED ORDER — ACETAMINOPHEN 325 MG PO TABS: 650.0000 mg | ORAL_TABLET | Freq: Four times a day (QID) | ORAL | Status: DC | PRN

## 2018-04-01 MED ORDER — ICOSAPENT ETHYL 1 G PO CAPS: 2.0000 g | ORAL_CAPSULE | Freq: Two times a day (BID) | ORAL | Status: DC

## 2018-04-01 MED ORDER — SODIUM CHLORIDE 0.9 % IV SOLN: 2.0000 g | Freq: Once | INTRAVENOUS | Status: DC

## 2018-04-01 MED ORDER — SERTRALINE HCL 50 MG PO TABS
50.0000 mg | ORAL_TABLET | Freq: Every day | ORAL | Status: DC
  Administered 2018-04-01 – 2018-04-02 (×2): 50 mg via ORAL
  Filled 2018-04-01 (×2): qty 1

## 2018-04-01 MED ORDER — ORAL CARE MOUTH RINSE
15.0000 mL | Freq: Two times a day (BID) | OROMUCOSAL | Status: DC
  Administered 2018-04-03: 15 mL via OROMUCOSAL

## 2018-04-01 MED ORDER — ISOSORBIDE MONONITRATE ER 60 MG PO TB24
30.0000 mg | ORAL_TABLET | Freq: Every day | ORAL | Status: DC
  Administered 2018-04-02 – 2018-04-03 (×2): 30 mg via ORAL
  Filled 2018-04-01 (×2): qty 1

## 2018-04-01 MED ORDER — INSULIN ASPART 100 UNIT/ML ~~LOC~~ SOLN
0.0000 [IU] | Freq: Three times a day (TID) | SUBCUTANEOUS | Status: DC
  Administered 2018-04-01: 11 [IU] via SUBCUTANEOUS
  Administered 2018-04-02: 8 [IU] via SUBCUTANEOUS
  Administered 2018-04-02 (×2): 15 [IU] via SUBCUTANEOUS
  Administered 2018-04-03: 8 [IU] via SUBCUTANEOUS
  Filled 2018-04-01: qty 1

## 2018-04-01 MED ORDER — GABAPENTIN 300 MG PO CAPS
300.0000 mg | ORAL_CAPSULE | Freq: Two times a day (BID) | ORAL | Status: DC
  Administered 2018-04-01 – 2018-04-03 (×4): 300 mg via ORAL
  Filled 2018-04-01 (×4): qty 1

## 2018-04-01 MED ORDER — DOCUSATE SODIUM 100 MG PO CAPS
100.0000 mg | ORAL_CAPSULE | Freq: Two times a day (BID) | ORAL | Status: DC
  Administered 2018-04-01 – 2018-04-03 (×4): 100 mg via ORAL
  Filled 2018-04-01 (×4): qty 1

## 2018-04-01 NOTE — ED Triage Notes (Signed)
Pt presents with wheezing going on for a week due to non compliance with medications (per pt). Hx of COPD

## 2018-04-01 NOTE — Telephone Encounter (Signed)
Contacted patient's wife to advise patient to go to ED now.  Patient states EMS was already on the way to check on patient.   FYI

## 2018-04-01 NOTE — ED Notes (Signed)
ED TO INPATIENT HANDOFF REPORT  ED Nurse Name and Phone #: Bonnie, [place]  S Name/Age/Gender Joshua [name] 78 y.o. male Room/Bed: WA17/WA17  Code Status   Code Status: DNR  Home/SNF/Other Home Patient oriented to: self, place, time and situation Is this baseline? Yes   Triage Complete: Triage complete  Chief Complaint short of breath   Triage Note Pt presents with wheezing going on for a week due to non compliance with medications (per pt). Hx of COPD   Allergies Allergies  Allergen Reactions  . Morphine And Related Other (See Comments)    Drenched with perspiration  . Entresto [Sacubitril-Valsartan]     hypotension  . Demerol [Meperidine] Nausea Only  . Starlix [Nateglinide] Other (See Comments)    gassy    Level of Care/Admitting Diagnosis ED Disposition    ED Disposition Condition Comment   Admit  Hospital Area: [facility] [[id number]]  Level of Care: Stepdown [14]  Admit to SDU based on following criteria: Respiratory Distress:  Frequent assessment and/or intervention to maintain adequate ventilation/respiration, pulmonary toilet, and respiratory treatment.  Diagnosis: Acute on chronic respiratory failure with hypoxia [facility]) [[id number]]  Admitting Physician: Karmen Bongo [[id number]]  Attending Physician: Karmen Bongo [[id number]]  Estimated length of stay: 3 - 4 days  Certification:: I certify this patient will need inpatient services for at least 2 midnights  PT Class (Do Not Modify): Inpatient [101]  PT Acc Code (Do Not Modify): Private [1]       B Medical/Surgery History Past Medical History:  Diagnosis Date  . Adenomatous colon polyp 10/16/2011   Repeat 2018  . Arthritis     Hips, R>L & KNEES  . CAD, multiple vessel    3V CAD cath 08/08/13----CABG done shortly after.  Cath 06/2016: clear bipasses/no interventional lesion.  . 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 pylori neg, no dysplasia or  malignancy.  . Chronic combined systolic and diastolic CHF, NYHA class 2 ([place]) 2016-2018   Followed by [facility].  Transesoph Echo 09/2016 EF improved to 40-45%; diffuse hypokinesis, Grd I DD.  09/2017 EF 50-55%,hypokin basal inferolateral wall, grd I DD,mild MR.  . Chronic renal insufficiency, stage 3 (moderate) ([place]) 2018   GFR 50s  . COPD (chronic obstructive pulmonary disease) ([place])    GOLD II.  Spirometry  2004 borderline obstruction; 2015 mod obst: noncompliant with bid symbicort so pulmonologist switched him to a once daily inhaler: Trelegy ellipta 12/2015.  Oxygen prn: goal 88-92%.  . Diabetic nephropathy ([place])    Elevated urine microalb/cr 03/2011  . DM type 2 (diabetes mellitus, type 2) ([place])    Poor control on max oral meds--pt eventually agreed to insulin therapy.  As of 2017 his DM is being managed by Dr. Dwyane Dee in endo.  On insulin pump as of 04/2017 endo f/u.  [name] Dysthymia    Worsened after 2015 heart surgery.  Memory loss +? as well.  Neuro eval 01/2017--plan is to get MRI brain, B12 level, and neuropsychiatric testing.  . Erectile dysfunction    Normal testosterone  . Hearing loss of both ears 2016   Hearing aids  . Hyperplastic colon polyp 2001   No further colonoscopies to be done as of 11/2016 GI eval.  . Hypertension   . IBS (irritable bowel syndrome)    responds relatively well to prn levsin.  Pepto bismol q AM trial by Dr. Loletha Carrow 08/2017, also checking fecal elastace and lactoferrin.  . Iron deficiency  anemia 2014   03/2012 capsule endoscopy showed 2 AVMs--likely responsible for his IDA--lifetime iron supp recommended + q4moCBCs.  . Ischemic cardiomyopathy 06/2016; 09/2016   06/2016: EF 25-30%, global hypokinesis, grd II DD, vent septum motion changes c/w LBBB, mod MV regurg, L atr mod/sev dilat, R atr mod dilat, mod incr pulm press, Grd II DD.  09/2016--EF 40-45%; diffuse hypokinesis, Grd I DD.  . Macular degeneration, dry    Mild, bilat (Optometrist, DMayford Knifeat  [employer] [place]  . Mild cognitive impairment    MCI, likley vascular etiology, + MDD, recurrent-->neuropsych testing 06/2017.  Neurol to repeat testing 1 yr  . Mixed hyperlipidemia    statin and vascepa as of 04/2017  . Obesity   . Other and unspecified angina pectoris   . PAD (peripheral artery disease) ([place] 02/2016   Abnormal ABI's and waveforms: LE arterial duplex ordered for f/u as per cardiologist's recommendation.  Vasc eval by Dr. CBridgett Larsson impression was minimal PAD, recommended maximize med mgmt.  .Marland KitchenPAF (paroxysmal atrial fibrillation) ([place] 10/2014   xarelto  . Pericardial effusion with cardiac tamponade 6//29/15   pericardiocentesis was done,  Infectious/inflamm (cytology showed NO MALIGNANT CELLS)  . Pleural plaque    Pleural plaques/asbestosis changes on CT chest done by pulm 05/2016.  .Marland KitchenPneumonia   . Tobacco dependence in remission    100+ pack-yr hx: quit after CABG   Past Surgical History:  Procedure Laterality Date  . APPENDECTOMY  1957  . CARDIAC CATHETERIZATION  08/08/2013  . CATARACT EXTRACTION W/ INTRAOCULAR LENS  IMPLANT, BILATERAL  04/08/2006 & 04/22/2006  . CATARACT EXTRACTION W/ INTRAOCULAR LENS  IMPLANT, BILATERAL Bilateral   . CHOLECYSTECTOMY OPEN  1999  . COLONOSCOPY  10/16/2011   Procedure: COLONOSCOPY;  Surgeon: RInda Castle MD;  [facility];  Service: Endoscopy;  Laterality: N/A;  . CORONARY ARTERY BYPASS GRAFT N/A 08/14/2013   Procedure: CORONARY ARTERY BYPASS GRAFTING (CABG);  Surgeon: SMelrose Nakayama MD;  [facility]  Service: Open Heart Surgery;  Laterality: N/A;  Times 4   using left internal mammary artery and endoscopically harvested bilateral saphenous vein  . ESOPHAGOGASTRODUODENOSCOPY  02/25/12   Atrophic gastritis with a few erosions--capsule endoscopy planned as of 02/25/12 (Dr. KDeatra Ina.  .Marland KitchenHARDWARE REMOVAL Right 08/12/2012   Procedure: HARDWARE REMOVAL;  Surgeon: CMcarthur Rossetti MD;  [facility]  ORS;  Service: Orthopedics;  Laterality: Right;  . HIP SURGERY Right 1954   Repair of slipped capital femoral epiphysis.  . INTRAOPERATIVE TRANSESOPHAGEAL ECHOCARDIOGRAM N/A 08/14/2013   Normal LV function. Procedure: INTRAOPERATIVE TRANSESOPHAGEAL ECHOCARDIOGRAM;  Surgeon: SMelrose Nakayama MD;  [facility]  Service: Open Heart Surgery;  Laterality: N/A;  . LEFT HEART CATHETERIZATION WITH CORONARY ANGIOGRAM N/A 08/08/2013   Procedure: LEFT HEART CATHETERIZATION WITH CORONARY ANGIOGRAM;  Surgeon: JJettie Booze MD;  [facility];  Service: Cardiovascular;  Laterality: N/A;  . PATELLA FRACTURE SURGERY Left ~ 1979   bolt + 3 screws to repair tib plateau fx  . PERICARDIAL TAP N/A 07/01/2013   Procedure: PERICARDIAL TAP;  Surgeon: JJettie Booze MD;  [facility];  Service: Cardiovascular;  Laterality: N/A;  . RIGHT/LEFT HEART CATH AND CORONARY ANGIOGRAPHY N/A 06/10/2016   Procedure: Right/Left Heart Cath and Coronary Angiography;  Surgeon: MLarey Dresser MD;  [facility];  Service: Cardiovascular;  Laterality: N/A;  . TESTICLE SURGERY  as a child   Undescended testicle brought  down into scrotum  . TONSILLECTOMY  1947  . TOTAL HIP ARTHROPLASTY Right 08/12/2012   Procedure: REMOVAL OF OLD PINS RIGHT HIP AND RIGHT TOTAL HIP ARTHROPLASTY ANTERIOR APPROACH;  Surgeon: Mcarthur Rossetti, MD;  [facility]S;  Service: Orthopedics;  Laterality: Right;  . TRANSESOPHAGEAL ECHOCARDIOGRAM  09/22/2016   EF 40-45%; diffuse hypokinesis, Grd I DD.  [name] TRANSTHORACIC ECHOCARDIOGRAM  10/30/14   Mod LVH, EF 60-65%, normal wall motion, mod mitral regurg, mild PAH  . TRANSTHORACIC ECHOCARDIOGRAM  06/2016; 10/01/17   06/2016: EF 25-30%, global hypokinesis, grd II DD, vent septum motion changes c/w LBBB, mod MV regurg, L atr mod/sev dilat, R atr mod dilat, mod incr pulm press, Grd II DD.  09/2017: EF 50-55%, Hypokinesis of the basal inferolateral wall, grd I DD, mild  MR, +LAE.     A IV Location/Drains/Wounds Patient Lines/Drains/Airways Status   Active Line/Drains/Airways    Name:   Placement date:   Placement time:   Site:   Days:   Peripheral IV 03/25/2018 Right Forearm   03/31/2018    1203    Forearm   less than 1   Incision (Closed) 08/14/13 Chest Other (Comment)   08/14/13    0903     1691   Incision (Closed) 08/14/13 Leg Right   08/14/13    0903     1691   Incision (Closed) 08/14/13 Leg Left   08/14/13    0903     1691   Wound / Incision (Open or Dehisced) 04/04/15 Other (Comment) Arm Right skin tear to right arm   04/04/15    1638    Arm   1093          Intake/Output Last 24 hours No intake or output data in the 24 hours ending 03/19/2018 1517  Labs/Imaging Results for orders placed or performed during the hospital encounter of 03/31/2018 (from the past 48 hour(s))  Comprehensive metabolic panel     Status: Abnormal   Collection Time: 03/18/2018 12:09 PM  Result Value Ref Range   Sodium 141 135 - 145 mmol/L   Potassium 3.5 3.5 - 5.1 mmol/L   Chloride 101 98 - 111 mmol/L   CO2 24 22 - 32 mmol/L   Glucose, Bld 167 (H) 70 - 99 mg/dL   BUN 33 (H) 8 - 23 mg/dL   Creatinine, Ser 1.23 0.61 - 1.24 mg/dL   Calcium 8.6 (L) 8.9 - 10.3 mg/dL   Total Protein 7.2 6.5 - 8.1 g/dL   Albumin 3.1 (L) 3.5 - 5.0 g/dL   AST 45 (H) 15 - 41 U/L   ALT 32 0 - 44 U/L   Alkaline Phosphatase 75 38 - 126 U/L   Total Bilirubin 1.0 0.3 - 1.2 mg/dL   GFR calc non Af Amer 56 (L) >60 mL/min   GFR calc Af Amer >60 >60 mL/min   Anion gap 16 (H) 5 - 15    Comment: Performed at [facility], [place] [address], [place], [place] [address]  CBC WITH DIFFERENTIAL     Status: Abnormal   Collection Time: 03/06/2018 12:09 PM  Result Value Ref Range   WBC 7.8 4.0 - 10.5 K/uL    Comment: WHITE COUNT CONFIRMED ON SMEAR   RBC 4.54 4.22 - 5.81 MIL/uL   Hemoglobin 12.1 (L) 13.0 - 17.0 g/dL   HCT 40.3 39.0 - 52.0 %   MCV 88.8 80.0 - 100.0 fL   MCH 26.7 26.0 - 34.0 pg  MCHC 30.0 30.0 - 36.0 g/dL   RDW 16.6 (H) 11.5 - 15.5 %   Platelets 204 150 - 400 K/uL   nRBC 0.0 0.0 - 0.2 %   Neutrophils Relative % 83 %   Neutro Abs 6.5 1.7 - 7.7 K/uL   Lymphocytes Relative 6 %   Lymphs Abs 0.5 (L) 0.7 - 4.0 K/uL   Monocytes Relative 8 %   Monocytes Absolute 0.6 0.1 - 1.0 K/uL   Eosinophils Relative 2 %   Eosinophils Absolute 0.2 0.0 - 0.5 K/uL   Basophils Relative 0 %   Basophils Absolute 0.0 0.0 - 0.1 K/uL   Immature Granulocytes 1 %   Abs Immature Granulocytes 0.04 0.00 - 0.07 K/uL    Comment: Performed at [facility], [place] [address], [place], [place] [address]  Lactic acid, plasma     Status: None   Collection Time: 03/19/2018 12:13 PM  Result Value Ref Range   Lactic Acid, Venous 1.2 0.5 - 1.9 mmol/L    Comment: Performed at [facility], [place] [address] [place], [place] [address]  Urinalysis, Routine w reflex microscopic     Status: Abnormal   Collection Time: 03/17/2018 12:13 PM  Result Value Ref Range   Color, Urine YELLOW YELLOW   APPearance HAZY (A) CLEAR   Specific Gravity, Urine 1.014 1.005 - 1.030   pH 5.0 5.0 - 8.0   Glucose, UA >=500 (A) NEGATIVE mg/dL   Hgb urine dipstick SMALL (A) NEGATIVE   Bilirubin Urine NEGATIVE NEGATIVE   Ketones, ur 5 (A) NEGATIVE mg/dL   Protein, ur 30 (A) NEGATIVE mg/dL   Nitrite NEGATIVE NEGATIVE   Leukocytes,Ua LARGE (A) NEGATIVE   RBC / HPF 11-20 0 - 5 RBC/hpf   WBC, UA 21-50 0 - 5 WBC/hpf   Bacteria, UA RARE (A) NONE SEEN   Squamous Epithelial / LPF 0-5 0 - 5   Mucus PRESENT    Hyaline Casts, UA PRESENT     Comment: Performed at [facility], [place] [address] [place], [place] [address]  Blood gas, arterial ([facility], [facility], [facility])     Status: Abnormal   Collection Time: 03/19/2018  1:00 PM  Result Value Ref Range   FIO2 100.00    Delivery systems OXYGEN MASK    pH, Arterial 7.330 (L) 7.350 - 7.450   pCO2 arterial 50.9 (H) 32.0 - 48.0 mmHg   pO2, Arterial  110 (H) 83.0 - 108.0 mmHg   Bicarbonate 26.1 20.0 - 28.0 mmol/L   Acid-Base Excess 0.0 0.0 - 2.0 mmol/L   O2 Saturation 96.9 %   Patient temperature 98.6    Collection site BRACHIAL ARTERY    Drawn by [id number]    Sample type ARTERIAL     Comment: Performed at [facility], [place] [address] [place], [place] [address]   Dg Chest Port 1 View  Result Date: 03/21/2018 CLINICAL DATA:  Wheezing for 1 week. EXAM: PORTABLE CHEST 1 VIEW COMPARISON:  PA and lateral chest 01/11/2018, 12/22/2017 and 08/27/2016. CT chest 06/02/2016. FINDINGS: Airspace disease throughout the right chest is most dense in the right upper lobe. There is also airspace disease in the left lung base. Heart size is upper normal. The patient is status post CABG. No pneumothorax or pleural effusion. IMPRESSION: Right much worse than left airspace disease most consistent with multifocal pneumonia. Electronically Signed   By: Inge Rise M.D.   On: 03/23/2018 12:56    Pending Labs Unresulted  Labs (From admission, onward)    Start     Ordered   04/02/18 0500  CBC with Differential/Platelet  Daily,   R     03/09/2018 1459   04/02/18 0500  Comprehensive metabolic panel  Daily,   R     03/21/2018 1459   03/22/2018 1458  Interleukin-6, Plasma  Once,   R     03/12/2018 1459   03/17/2018 1458  Procalcitonin  Once,   R     03/18/2018 1459   03/23/2018 1458  Ferritin  Once,   R     03/31/2018 1459   03/16/2018 1458  D-dimer, quantitative (not at [facility])  Once,   R     03/27/2018 1459   03/15/2018 1458  Sedimentation rate  Once,   R     03/20/2018 1459   03/29/2018 1458  Lactate dehydrogenase  Once,   R     03/11/2018 1459   04/04/2018 1350  Brain natriuretic peptide  Add-on,   STAT     03/09/2018 1349   03/15/2018 1213  Lactic acid, plasma  Now then every 2 hours,   STAT     03/31/2018 1214   03/27/2018 1213  Blood Culture (routine x 2)  BLOOD CULTURE X 2,   STAT     03/13/2018 1214   03/06/2018 1213  Urine culture  ONCE - STAT,   STAT     04/02/2018  1214          Vitals/Pain Today's Vitals   03/27/2018 1315 03/19/2018 1425 03/08/2018 1445 03/18/2018 1501  BP:   133/80 128/63  Pulse:  92  89  Resp: (!) 27 (!) 27 (!) 30 19  Temp:      TempSrc:      SpO2:  97%  95%  Weight:      Height:      PainSc:    0-No pain    Isolation Precautions Airborne and Contact precautions  Medications Medications  azithromycin (ZITHROMAX) 500 mg in sodium chloride 0.9 % 250 mL IVPB (500 mg Intravenous New Bag/Given 03/08/2018 1452)  atorvastatin (LIPITOR) tablet 40 mg (has no administration in time range)  bisoprolol (ZEBETA) tablet 5 mg (has no administration in time range)  isosorbide mononitrate (IMDUR) 24 hr tablet 30 mg (has no administration in time range)  Icosapent Ethyl CAPS 2 g (has no administration in time range)  mirtazapine (REMERON) tablet 45 mg (has no administration in time range)  sertraline (ZOLOFT) tablet 50 mg (has no administration in time range)  insulin regular human CONCENTRATED (HUMULIN R) 500 UNIT/ML injection 10 Units (has no administration in time range)  tamsulosin (FLOMAX) capsule 0.4 mg (has no administration in time range)  rivaroxaban (XARELTO) tablet 20 mg (has no administration in time range)  gabapentin (NEURONTIN) capsule 300 mg (has no administration in time range)  mometasone-formoterol (DULERA) 200-5 MCG/ACT inhaler 2 puff (has no administration in time range)  acetaminophen (TYLENOL) tablet 650 mg (has no administration in time range)  docusate sodium (COLACE) capsule 100 mg (has no administration in time range)  polyethylene glycol (MIRALAX / GLYCOLAX) packet 17 g (has no administration in time range)  bisacodyl (DULCOLAX) EC tablet 5 mg (has no administration in time range)  ondansetron (ZOFRAN) tablet 4 mg (has no administration in time range)    Or  ondansetron (ZOFRAN) injection 4 mg (has no administration in time range)  insulin aspart (novoLOG) injection 0-15 Units (has no administration in time range)   sodium chloride  flush (NS) 0.9 % injection 3 mL (has no administration in time range)  ceFEPIme (MAXIPIME) 2 g in sodium chloride 0.9 % 100 mL IVPB (has no administration in time range)  vancomycin (VANCOCIN) 2,000 mg in sodium chloride 0.9 % 500 mL IVPB (has no administration in time range)  albuterol (PROVENTIL HFA;VENTOLIN HFA) 108 (90 Base) MCG/ACT inhaler 8 puff (8 puffs Inhalation Given 03/26/2018 1246)  cefTRIAXone (ROCEPHIN) 1 g in sodium chloride 0.9 % 100 mL IVPB (0 g Intravenous Stopped 03/21/2018 1440)    Mobility walks with device Moderate fall risk   Focused Assessments Cardiac Assessment Handoff:  Cardiac Rhythm: Atrial fibrillation Lab Results  Component Value Date   CKTOTAL 47 (L) 06/16/2016   TROPONINI 0.17 (HH) 10/01/2017   Lab Results  Component Value Date   DDIMER 1.07 (H) 06/04/2016   Does the Patient currently have chest pain? No     R Recommendations: See Admitting Provider Note  Report given to:   Additional Notes:

## 2018-04-01 NOTE — ED Notes (Signed)
[id number]

## 2018-04-01 NOTE — ED Notes (Signed)
Pt removed IV when attempting to remove hospital gown. IV site assessed, gown removed, pt cleaned up and repositioned. New PIV started for abx.

## 2018-04-01 NOTE — ED Provider Notes (Signed)
[facility] DEPT Provider Note   CSN: [id number] Arrival date & time: 03/25/2018  1138    History   Chief Complaint Chief Complaint  Patient presents with  . Shortness of Breath    COPD    HPI Joshua [name] is a 78 y.o. male.     The history is provided by the patient, the EMS personnel and medical records. No language interpreter was used.  Shortness of Breath   Joshua [name] Joshua [name] is a 78 y.o. male who presents to the Emergency Department complaining of sob. He presents to the emergency department by EMS for evaluation of progressive shortness of breath. He has a history of COPD and is on baseline 2 L nasal cannula oxygen. Over the last week he is experienced progressive shortness of breath with nonproductive cough. He denies any fevers, chest pain, nausea, vomiting, abdominal pain, diarrhea. He does have associated nasal congestion. He does have a sick contacts at home with unknown symptoms and unknown illness, but he is trying to separate from her. He has been told today that she is in quarantine. He is unsure his medical problems or his medications but states he is in compliance with them. Symptoms are severe, constant, worsening.  He states that he started prednisone today for COPD exacerbation.   Past Medical History:  Diagnosis Date  . Adenomatous colon polyp 10/16/2011   Repeat 2018  . Arthritis     Hips, R>L & KNEES  . CAD, multiple vessel    3V CAD cath 08/08/13----CABG done shortly after.  Cath 06/2016: clear bipasses/no interventional lesion.  . 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 pylori neg, no dysplasia or malignancy.  . Chronic combined systolic and diastolic CHF, NYHA class 2 ([place]) 2016-2018   Followed by [facility].  Transesoph Echo 09/2016 EF improved to 40-45%; diffuse hypokinesis, Grd I DD.  09/2017 EF 50-55%,hypokin basal inferolateral wall, grd I DD,mild MR.  . Chronic renal insufficiency,  stage 3 (moderate) ([place]) 2018   GFR 50s  . COPD (chronic obstructive pulmonary disease) ([place])    GOLD II.  Spirometry  2004 borderline obstruction; 2015 mod obst: noncompliant with bid symbicort so pulmonologist switched him to a once daily inhaler: Trelegy ellipta 12/2015.  Oxygen prn: goal 88-92%.  . Diabetic nephropathy ([place])    Elevated urine microalb/cr 03/2011  . DM type 2 (diabetes mellitus, type 2) ([place])    Poor control on max oral meds--pt eventually agreed to insulin therapy.  As of 2017 his DM is being managed by Dr. Dwyane Dee in endo.  On insulin pump as of 04/2017 endo f/u.  [name] Dysthymia    Worsened after 2015 heart surgery.  Memory loss +? as well.  Neuro eval 01/2017--plan is to get MRI brain, B12 level, and neuropsychiatric testing.  . Erectile dysfunction    Normal testosterone  . Hearing loss of both ears 2016   Hearing aids  . Hyperplastic colon polyp 2001   No further colonoscopies to be done as of 11/2016 GI eval.  . Hypertension   . IBS (irritable bowel syndrome)    responds relatively well to prn levsin.  Pepto bismol q AM trial by Dr. Loletha Carrow 08/2017, also checking fecal elastace and lactoferrin.  . Iron deficiency anemia 2014   03/2012 capsule endoscopy showed 2 AVMs--likely responsible for his IDA--lifetime iron supp recommended + q8moCBCs.  . Ischemic cardiomyopathy 06/2016; 09/2016   06/2016: EF 25-30%, global hypokinesis, grd II  DD, vent septum motion changes c/w LBBB, mod MV regurg, L atr mod/sev dilat, R atr mod dilat, mod incr pulm press, Grd II DD.  09/2016--EF 40-45%; diffuse hypokinesis, Grd I DD.  . Macular degeneration, dry    Mild, bilat (Optometrist, Mayford Knife at [facility] [facility] [place] in [place], [place])  . Mild cognitive impairment    MCI, likley vascular etiology, + MDD, recurrent-->neuropsych testing 06/2017.  Neurol to repeat testing 1 yr  . Mixed hyperlipidemia    statin and vascepa as of 04/2017  . Obesity   . Other and unspecified angina pectoris   . PAD  (peripheral artery disease) ([place]) 02/2016   Abnormal ABI's and waveforms: LE arterial duplex ordered for f/u as per cardiologist's recommendation.  Vasc eval by Dr. Bridgett Larsson: impression was minimal PAD, recommended maximize med mgmt.  [name] PAF (paroxysmal atrial fibrillation) ([place]) 10/2014   xarelto  . Pericardial effusion with cardiac tamponade 6//29/15   pericardiocentesis was done,  Infectious/inflamm (cytology showed NO MALIGNANT CELLS)  . Pleural plaque    Pleural plaques/asbestosis changes on CT chest done by pulm 05/2016.  [name] Pneumonia   . Tobacco dependence in remission    100+ pack-yr hx: quit after CABG    Patient Active Problem List   Diagnosis Date Noted  . Acute respiratory failure with hypoxia ([place]) 12/22/2017  . Acute renal failure (ARF) ([place]) 10/01/2017  . Symptomatic bradycardia 09/30/2017  . Acute renal failure superimposed on stage 3 chronic kidney disease ([place]) 09/30/2017  . Medication side effect   . Hypotension due to medication 08/27/2016  . Near syncope 08/27/2016  . Frequent falls 06/17/2016  . Weakness generalized 06/17/2016  . Chronic systolic CHF (congestive heart failure) ([place]) 06/17/2016  . AKI (acute kidney injury) ([place])   . Respiratory distress 06/04/2016  . Acute respiratory distress 06/04/2016  . Bronchopneumonia 06/04/2016  . Tachycardia 06/04/2016  . Hypoxia   . Chronic diastolic CHF (congestive heart failure) ([place]) 02/18/2015  . Acute upper respiratory infection 02/06/2015  . Facial cellulitis 11/28/2014  . Abscess of nasal cavity 11/28/2014  . Cellulitis and abscess of neck 11/28/2014  . Acute on chronic systolic and diastolic heart failure, NYHA class 2 ([place]) 11/12/2014  . Dyspnea 10/29/2014  . Cough 10/29/2014  . History of asbestosis 10/29/2014  . Low oxygen saturation 10/29/2014  . Chronic respiratory failure with hypoxia ([place]) 10/29/2014  . COPD exacerbation ([place]) 10/29/2014  . Acute on chronic respiratory failure with hypoxia ([place])   .  Depression 06/11/2014  . Diabetes mellitus with complication ([place]) 41/96/2229  . Postoperative anemia due to acute blood loss 10/12/2013  . 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 Coronary artery disease- 3 V at cath 08/08/13 08/08/2013  . DOE (dyspnea on exertion)   . Acute on chronic respiratory failure ([place]) 07/02/2013  . PAF (paroxysmal atrial fibrillation) ([place]) 07/02/2013  . Acute bronchitis 07/01/2013  . Pleural effusion with elevated pro BNP 07/01/2013  . Pericardial effusion 07/01/2013  . Pericardial effusion with cardiac tamponade- s/p perciocentesis 06/30/13   . ACS (acute coronary syndrome) ([place]) 06/30/2013  . Fatigue 06/30/2013  . Nonspecific abnormal electrocardiogram (ECG) (EKG) 06/30/2013  . COPD with exacerbation ([place]) 06/30/2013  . Abnormal chest x-ray 10/03/2012  . 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 Arthritis   . COPD (chronic obstructive pulmonary disease) ([place])   . CKD (chronic kidney disease) stage 3, GFR 30-59 ml/min (H[facility])   . Obesity   . Degenerative arthritis of hip 08/12/2012  . Preoperative clearance  07/07/2012  . Hyperlipidemia 03/16/2012  . Tobacco dependence   . Health maintenance examination 09/16/2011  . HTN (hypertension), benign 09/02/2011  . Prostate cancer screening 09/02/2011    Past Surgical History:  Procedure Laterality Date  . APPENDECTOMY  1957  . CARDIAC CATHETERIZATION  08/08/2013  . CATARACT EXTRACTION W/ INTRAOCULAR LENS  IMPLANT, BILATERAL  04/08/2006 & 04/22/2006  . CATARACT EXTRACTION W/ INTRAOCULAR LENS  IMPLANT, BILATERAL Bilateral   . CHOLECYSTECTOMY OPEN  1999  . COLONOSCOPY  10/16/2011   Procedure: COLONOSCOPY;  Surgeon: Inda Castle, MD;  Location: [facility];  Service: Endoscopy;  Laterality: N/A;  . CORONARY ARTERY BYPASS GRAFT N/A 08/14/2013   Procedure: CORONARY ARTERY BYPASS GRAFTING (CABG);  Surgeon: Melrose Nakayama, MD;  Location: [facility];  Service: Open Heart Surgery;  Laterality: N/A;  Times 4   using left internal  mammary artery and endoscopically harvested bilateral saphenous vein  . ESOPHAGOGASTRODUODENOSCOPY  02/25/12   Atrophic gastritis with a few erosions--capsule endoscopy planned as of 02/25/12 (Dr. Deatra Ina).  [name] HARDWARE REMOVAL Right 08/12/2012   Procedure: HARDWARE REMOVAL;  Surgeon: Mcarthur Rossetti, MD;  Location: [facility];  Service: Orthopedics;  Laterality: Right;  . HIP SURGERY Right 1954   Repair of slipped capital femoral epiphysis.  . INTRAOPERATIVE TRANSESOPHAGEAL ECHOCARDIOGRAM N/A 08/14/2013   Normal LV function. Procedure: INTRAOPERATIVE TRANSESOPHAGEAL ECHOCARDIOGRAM;  Surgeon: Melrose Nakayama, MD;  Location: [facility];  Service: Open Heart Surgery;  Laterality: N/A;  . LEFT HEART CATHETERIZATION WITH CORONARY ANGIOGRAM N/A 08/08/2013   Procedure: LEFT HEART CATHETERIZATION WITH CORONARY ANGIOGRAM;  Surgeon: Jettie Booze, MD;  Location: [facility] CATH LAB;  Service: Cardiovascular;  Laterality: N/A;  . PATELLA FRACTURE SURGERY Left ~ 1979   bolt + 3 screws to repair tib plateau fx  . PERICARDIAL TAP N/A 07/01/2013   Procedure: PERICARDIAL TAP;  Surgeon: Jettie Booze, MD;  Location: [facility] CATH LAB;  Service: Cardiovascular;  Laterality: N/A;  . RIGHT/LEFT HEART CATH AND CORONARY ANGIOGRAPHY N/A 06/10/2016   Procedure: Right/Left Heart Cath and Coronary Angiography;  Surgeon: Larey Dresser, MD;  Location: [facility] CV LAB;  Service: Cardiovascular;  Laterality: N/A;  . TESTICLE SURGERY  as a child   Undescended testicle brought down into scrotum  . TONSILLECTOMY  1947  . TOTAL HIP ARTHROPLASTY Right 08/12/2012   Procedure: REMOVAL OF OLD PINS RIGHT HIP AND RIGHT TOTAL HIP ARTHROPLASTY ANTERIOR APPROACH;  Surgeon: Mcarthur Rossetti, MD;  Location: [facility];  Service: Orthopedics;  Laterality: Right;  . TRANSESOPHAGEAL ECHOCARDIOGRAM  09/22/2016   EF 40-45%; diffuse hypokinesis, Grd I DD.  [name] TRANSTHORACIC ECHOCARDIOGRAM  10/30/14   Mod LVH, EF 60-65%, normal wall motion, mod  mitral regurg, mild PAH  . TRANSTHORACIC ECHOCARDIOGRAM  06/2016; 10/01/17   06/2016: EF 25-30%, global hypokinesis, grd II DD, vent septum motion changes c/w LBBB, mod MV regurg, L atr mod/sev dilat, R atr mod dilat, mod incr pulm press, Grd II DD.  09/2017: EF 50-55%, Hypokinesis of the basal inferolateral wall, grd I DD, mild MR, +LAE.        Home Medications    Prior to Admission medications   Medication Sig Start Date End Date Taking? Authorizing Provider  acetaminophen (TYLENOL) 325 MG tablet Take 2 tablets (650 mg total) by mouth every 4 (four) hours as needed for headache or mild pain. 08/09/13  Yes Kilroy, Luke K, PA-C  albuterol (PROVENTIL) (2.5 MG/3ML) 0.083% nebulizer solution Take 3 mLs (2.5 mg total) by nebulization  every 6 (six) hours as needed for wheezing or shortness of breath. 01/11/18  Yes McGowen, Adrian Blackwater, MD  Ascorbic Acid (VITAMIN C) 1000 MG tablet Take 1,000 mg by mouth daily.   Yes [provider]  atorvastatin (LIPITOR) 40 MG tablet TAKE ONE TABLET BY MOUTH DAILY Patient taking differently: Take 40 mg by mouth daily at 6 PM.  03/25/18  Yes McGowen, Adrian Blackwater, MD  bisoprolol (ZEBETA) 5 MG tablet Take 1 tablet (5 mg total) by mouth daily. 11/26/17 11/26/18 Yes McGowen, Adrian Blackwater, MD  Cholecalciferol (VITAMIN D-3) 1000 units CAPS Take 1,000 Units by mouth daily. Reported on 02/26/2015   Yes [provider]  fluticasone (FLONASE) 50 MCG/ACT nasal spray Place 2 sprays into both nostrils daily. Patient taking differently: Place 2 sprays into both nostrils as needed for allergies.  06/04/16  Yes McGowen, Adrian Blackwater, MD  gabapentin (NEURONTIN) 100 MG capsule TAKE TWO CAPSULES BY MOUTH THREE TIMES DAILY Patient taking differently: Take 300 mg by mouth 2 (two) times daily.  08/09/17  Yes McGowen, Adrian Blackwater, MD  HUMULIN R 500 UNIT/ML injection Inject 0.6m under the skin 3 times daily. Patient taking differently: Inject 8-10 Units into the skin 3 (three) times daily with  meals. 10 units in the morning. 8 units at noon. 8 units at night. 02/17/18  Yes KElayne Snare MD  isosorbide mononitrate (IMDUR) 30 MG 24 hr tablet TAKE ONE TABLET BY MOUTH DAILY Patient taking differently: Take 30 mg by mouth daily.  01/14/18  Yes McGowen, PAdrian Blackwater MD  mirtazapine (REMERON) 45 MG tablet Take 1 tablet (45 mg total) by mouth at bedtime. 10/19/17  Yes McGowen, PAdrian Blackwater MD  Multiple Vitamin (MULTIVITAMIN WITH MINERALS) TABS Take 1 tablet by mouth daily.   Yes [provider]  nitroGLYCERIN (NITROSTAT) 0.4 MG SL tablet PLACE 1 TABLET UNDER THE TONGUE EVERY 5 MINUTES AS NEEDED FOR CHEST PAIN Patient taking differently: Place 0.4 mg under the tongue every 5 (five) minutes as needed for chest pain.  03/30/17  Yes MLarey Dresser MD  predniSONE (DELTASONE) 10 MG tablet Take 2 tablets (20 mg total) by mouth daily with breakfast for 5 days. 03/31/18 04/05/18 Yes NFenton Foy NP  PROAIR HFA 108 (90 Base) MCG/ACT inhaler INHALE 2 PUFFS INTO THE LUNGS EVERY 4 HOURS AS NEEDED FOR WHEEZING ORSHORTNESS OF BREATH Patient taking differently: Inhale 2 puffs into the lungs every 4 (four) hours as needed for wheezing or shortness of breath.  06/05/16  Yes McGowen, PAdrian Blackwater MD  sertraline (ZOLOFT) 25 MG tablet TAKE TWO TABLETS BY MOUTH AT BEDTIME Patient taking differently: Take 50 mg by mouth at bedtime.  03/25/18  Yes McGowen, PAdrian Blackwater MD  SYMBICORT 160-4.5 MCG/ACT inhaler INHALE 2 PUFFS INTO THE LUNGS TWICE DAILY Patient taking differently: Inhale 2 puffs into the lungs 2 (two) times daily.  12/01/16  Yes McGowen, PAdrian Blackwater MD  SYNJARDY 05-998 MG TABS TAKE ONE TABLET BY MOUTH TWICE DAILY AFTER A MEAL Patient taking differently: Take 1 tablet by mouth 2 (two) times daily.  02/28/18  Yes KElayne Snare MD  tamsulosin (FLOMAX) 0.4 MG CAPS capsule TAKE ONE CAPSULE BY MOUTH DAILY AFTER SUPPER Patient taking differently: Take 0.4 mg by mouth daily after supper. TAKE ONE CAPSULE BY MOUTH DAILY AFTER  SUPPER 10/29/17  Yes McGowen, PAdrian Blackwater MD  torsemide (DEMADEX) 20 MG tablet Take 1 tablet (20 mg total) by mouth daily. May take 1 tab as needed 02/24/18  Yes STamala Julian  Renato Gails, NP  VASCEPA 1 g CAPS TAKE TWO CAPSULES BY MOUTH TWICE DAILY Patient taking differently: Take 2 g by mouth 2 (two) times daily.  02/07/18  Yes Elayne Snare, MD  XARELTO 20 MG TABS tablet TAKE ONE TABLET BY MOUTH DAILY WITH SUPPER Patient taking differently: Take 20 mg by mouth daily with supper.  10/28/17  Yes Larey Dresser, MD  ACCU-CHEK GUIDE test strip USE TO check blood sugar THREE TIMES DAILY 03/25/18   Elayne Snare, MD  Insulin Syringe-Needle U-100 (INSULIN SYRINGE 1CC/31GX5/16") 31G X 5/16" 1 ML MISC USE AS INSTRUCTED TO INJECT INSULIN. 03/10/18   Elayne Snare, MD  ipratropium (ATROVENT) 0.03 % nasal spray Place 2 sprays into both nostrils 2 (two) times daily. Patient not taking: Reported on 03/31/2018 01/04/18   Tammi Sou, MD    Family History Family History  Problem Relation Age of Onset  . Heart disease Father   . Heart attack Father   . CVA Mother   . Hypertension Mother   . Diabetes Paternal Grandmother   . Breast cancer Sister   . Diabetes Maternal Uncle   . Heart disease Maternal Uncle   . Stroke Neg Hx     Social History Social History   Tobacco Use  . Smoking status: Former Smoker    Packs/day: 1.00    Years: 64.00    Pack years: 64.00    Types: Cigarettes    Last attempt to quit: 03/03/2018    Years since quitting: 0.0  . Smokeless tobacco: Former Systems developer    Types: Chew    Quit date: 06/30/2013  Substance Use Topics  . Alcohol use: No    Alcohol/week: 0.0 standard drinks  . Drug use: No     Allergies   Morphine and related; Entresto [sacubitril-valsartan]; Demerol [meperidine]; and Starlix [nateglinide]   Review of Systems Review of Systems  Respiratory: Positive for shortness of breath.   All other systems reviewed and are negative.    Physical Exam Updated Vital Signs  BP 134/71   Pulse 91   Temp (S) (!) 100.8 F (38.2 C) (Rectal)   Resp (!) 27   Ht _0  (1.803 m)   Wt 105.2 kg   SpO2 95%   BMI 32.36 kg/m   Physical Exam Vitals signs and nursing note reviewed.  Constitutional:      Appearance: He is well-developed.  HENT:     Head: Normocephalic and atraumatic.  Cardiovascular:     Rate and Rhythm: Normal rate and regular rhythm.     Heart sounds: No murmur.  Pulmonary:     Effort: Respiratory distress present.     Comments: Tachypnea with diffuse wheezes Abdominal:     Palpations: Abdomen is soft.     Tenderness: There is no abdominal tenderness. There is no guarding or rebound.  Musculoskeletal:        General: No swelling or tenderness.  Skin:    General: Skin is warm and dry.     Capillary Refill: Capillary refill takes less than 2 seconds.  Neurological:     Mental Status: He is alert and oriented to person, place, and time.     Comments: Mildly confused, slow to answer questions.  Psychiatric:        Behavior: Behavior normal.      ED Treatments / Results  Labs (all labs ordered are listed, but only abnormal results are displayed) Labs Reviewed  COMPREHENSIVE METABOLIC PANEL - Abnormal; Notable for the following  components:      Result Value   Glucose, Bld 167 (*)    BUN 33 (*)    Calcium 8.6 (*)    Albumin 3.1 (*)    AST 45 (*)    GFR calc non Af Amer 56 (*)    Anion gap 16 (*)    All other components within normal limits  CBC WITH DIFFERENTIAL/PLATELET - Abnormal; Notable for the following components:   Hemoglobin 12.1 (*)    RDW 16.6 (*)    Lymphs Abs 0.5 (*)    All other components within normal limits  URINALYSIS, ROUTINE W REFLEX MICROSCOPIC - Abnormal; Notable for the following components:   APPearance HAZY (*)    Glucose, UA >=500 (*)    Hgb urine dipstick SMALL (*)    Ketones, ur 5 (*)    Protein, ur 30 (*)    Leukocytes,Ua LARGE (*)    Bacteria, UA RARE (*)    All other components within normal  limits  BLOOD GAS, ARTERIAL - Abnormal; Notable for the following components:   pH, Arterial 7.330 (*)    pCO2 arterial 50.9 (*)    pO2, Arterial 110 (*)    All other components within normal limits  CULTURE, BLOOD (ROUTINE X 2)  CULTURE, BLOOD (ROUTINE X 2)  URINE CULTURE  LACTIC ACID, PLASMA  LACTIC ACID, PLASMA  BRAIN NATRIURETIC PEPTIDE    EKG EKG Interpretation  Date/Time:  Friday April 01 2018 12:07:56 EDT Ventricular Rate:  98 PR Interval:    QRS Duration: 176 QT Interval:  439 QTC Calculation: 561 R Axis:   -16 Text Interpretation:  Atrial fibrillation Left bundle branch block Confirmed by Quintella Reichert [phone]) on 03/19/2018 12:16:31 PM Also confirmed by Quintella Reichert [phone]), editor [name] [phone])  on 03/10/2018 12:39:50 PM   Radiology Dg Chest Port 1 View  Result Date: 03/18/2018 CLINICAL DATA:  Wheezing for 1 week. EXAM: PORTABLE CHEST 1 VIEW COMPARISON:  PA and lateral chest 01/11/2018, 12/22/2017 and 08/27/2016. CT chest 06/02/2016. FINDINGS: Airspace disease throughout the right chest is most dense in the right upper lobe. There is also airspace disease in the left lung base. Heart size is upper normal. The patient is status post CABG. No pneumothorax or pleural effusion. IMPRESSION: Right much worse than left airspace disease most consistent with multifocal pneumonia. Electronically Signed   By: Inge Rise M.D.   On: 03/27/2018 12:56    Procedures Procedures (including critical care time) CRITICAL CARE Performed by: Quintella Reichert   Total critical care time: 40 minutes  Critical care time was exclusive of separately billable procedures and treating other patients.  Critical care was necessary to treat or prevent imminent or life-threatening deterioration.  Critical care was time spent personally by me on the following activities: development of treatment plan with patient and/or surrogate as well as nursing, discussions with consultants,  evaluation of patient's response to treatment, examination of patient, obtaining history from patient or surrogate, ordering and performing treatments and interventions, ordering and review of laboratory studies, ordering and review of radiographic studies, pulse oximetry and re-evaluation of patient's condition.  Medications Ordered in ED Medications  azithromycin (ZITHROMAX) 500 mg in sodium chloride 0.9 % 250 mL IVPB (has no administration in time range)  albuterol (PROVENTIL HFA;VENTOLIN HFA) 108 (90 Base) MCG/ACT inhaler 8 puff (8 puffs Inhalation Given 03/12/2018 1246)  cefTRIAXone (ROCEPHIN) 1 g in sodium chloride 0.9 % 100 mL IVPB (1 g Intravenous New Bag/Given 03/26/2018 1247)  Initial Impression / Assessment and Plan / ED Course  I have reviewed the triage vital signs and the nursing notes.  Pertinent labs & imaging results that were available during my care of the patient were reviewed by me and considered in my medical decision making (see chart for details).        Patient with history of COPD here for evaluation increased shortness of breath. He is in respiratory distress on ED arrival with increased oxygen requirement. He was treated with albuterol MDI as well as antibiotics for possible pneumonia. Additional history available from wife after patient's initial presentation. She states that he has been sick for the last week and there is another member of the household that is sick with influenza like illness. The other household member has not been evaluated by a doctor at this point.  Patient in distress on ED arrival. Discussed advanced directives and he states that he would not want intubation or CPR. This was again addressed with his wife who is in agreement with plan. Due to concern for possible coronavirus infection IV fluids were withheld. Hospitalist consulted for admission.  Final Clinical Impressions(s) / ED Diagnoses   Final diagnoses:  None    ED Discharge Orders     None       Quintella Reichert, MD 03/24/2018 1418

## 2018-04-01 NOTE — Telephone Encounter (Signed)
Copied from [place] [phone] [id number]. Topic: Quick Communication - See Telephone Encounter >> Apr 01, 2018 10:05 AM Blase Mess A wrote: CRM for notification. See Telephone encounter for: 03/21/2018.  Elmyra Ricks with [facility] is calling to Calling to notify that the patient is wheezing. He 02 yesterday is 18, Wednesday 88, Today is 72.  Patient sounds tight and wheezy. Please advise with the patient [phone] [facility]- [phone] x [phone]

## 2018-04-01 NOTE — Telephone Encounter (Signed)
Contacted patient's wife again.  EMS on scene now.  I advised patient's wife that an appointment w/ Dr Raoul Pitch would not be appropriate.   Patient's wife understand that if EMS advised patient to go to ED, that she would make him go.

## 2018-04-01 NOTE — ED Notes (Signed)
Pharmacy contacted to verify orders due to holding in ED.

## 2018-04-01 NOTE — Telephone Encounter (Signed)
Patient wife, Joshua [name] (verified approval of PHI) called to schedule appt for today. Stated that his oxygen level has been decreasing. I told [name] that Dr. Anitra Lauth not in office today, but I would forward call documentation to Dr. Raoul Pitch to advise.  This is 2nd telephone, I was unable to open or add my notes from my conversation with patient wife.  Contact patient wife at home number.  Thank you [name]

## 2018-04-01 NOTE — ED Notes (Signed)
Md at bedside

## 2018-04-01 NOTE — ED Notes (Signed)
Respiratory paged for ABG.

## 2018-04-01 NOTE — ED Notes (Signed)
Bed: WA17 Expected date:  Expected time:  Means of arrival:  Comments: Resp Distress

## 2018-04-01 NOTE — H&P (Addendum)
History and Physical    Joshua [name] CBJ:[id number] DOB: 1940-07-11 DOA: 03/18/2018  PCP: Tammi Sou, MD Consultants:  Vaughan Browner - pulmonology; Dwyane Dee - endocrinology; Bensimhon - cardiology; Danis - GI Patient coming from: Home - lives with wife and wife's sister; HYW:VPXT, [phone]  Chief Complaint: SOB  HPI: PISTOL Joshua [name] is a 78 y.o. male with medical history significant of afib on Xarelto; PAD; pericardial effusion with tamponade in 2015; HLD; dementia; IBS; HTN; CAD s/p CABG; DM; COPD; stage 3 CKD; and chronic combined CHF presenting with SOB.   He reports increased WOB and cough starting about 10 days and progressing.  He denies fever.   His sister-in-law lives with him and his wife and has also had flu-like illness, for which she is quarantined.  He reports that he is feeling somewhat better now.  He is clear that he does not wish to be intubated or have CPR should the situation arise.   ED Course:   COPD on home O2, presenting with SOB.  Has a sick contact (sister-in-law) who is "quarantined" in his home.  78% on RA.  Given Albuterol.  On NRB, mid-90s.  Increased WOB, wheezes, rhonchi.  Multifocal PNA - give Rocephin, Azithromycin.  Normal CBC.  No CPR/intubation.  Had one dose of oral prednisone this AM after tele-visit with pulm yesterday.  Review of Systems: As per HPI; otherwise review of systems reviewed and negative.   Ambulatory Status: Ambulates with a walker in the house or a cane outside  Past Medical History:  Diagnosis Date  . Adenomatous colon polyp 10/16/2011   Repeat 2018  . Arthritis     Hips, R>L & KNEES  . CAD, multiple vessel    3V CAD cath 08/08/13----CABG done shortly after.  Cath 06/2016: clear bipasses/no interventional lesion.  . 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 pylori neg, no dysplasia or malignancy.  . Chronic combined systolic and diastolic CHF, NYHA class 2 ([place]) 2016-2018   [facility].  Transesoph Echo 09/2016 EF improved to 40-45%; diffuse hypokinesis, Grd I DD.  09/2017 EF 50-55%,hypokin basal inferolateral wall, grd I DD,mild MR.  . Chronic renal insufficiency, stage 3 (moderate) ([place]) 2018   GFR 50s  . COPD (chronic obstructive pulmonary disease) ([place])    GOLD II.  Spirometry  2004 borderline obstruction; 2015 mod obst: noncompliant with bid symbicort so pulmonologist switched him to a once daily inhaler: Trelegy ellipta 12/2015.  Oxygen prn: goal 88-92%.  . Diabetic nephropathy ([place])    Elevated urine microalb/cr 03/2011  . DM type 2 (diabetes mellitus, type 2) ([place])    Poor control on max oral meds--pt eventually agreed to insulin therapy.  As of 2017 his DM is being managed by Dr. Dwyane Dee in endo.  On insulin pump as of 04/2017 endo f/u.  [name] Dysthymia    Worsened after 2015 heart surgery.  Memory loss +? as well.  Neuro eval 01/2017--plan is to get MRI brain, B12 level, and neuropsychiatric testing.  . Erectile dysfunction    Normal testosterone  . Hearing loss of both ears 2016   Hearing aids  . Hyperplastic colon polyp 2001   No further colonoscopies to be done as of 11/2016 GI eval.  . Hypertension   . IBS (irritable bowel syndrome)    responds relatively well to prn levsin.  Pepto bismol q AM trial by Dr. Loletha Carrow 08/2017, also checking fecal elastace and lactoferrin.  . Iron deficiency  anemia 2014   03/2012 capsule endoscopy showed 2 AVMs--likely responsible for his IDA--lifetime iron supp recommended + q25moCBCs.  . Ischemic cardiomyopathy 06/2016; 09/2016   06/2016: EF 25-30%, global hypokinesis, grd II DD, vent septum motion changes c/w LBBB, mod MV regurg, L atr mod/sev dilat, R atr mod dilat, mod incr pulm press, Grd II DD.  09/2016--EF 40-45%; diffuse hypokinesis, Grd I DD.  . Macular degeneration, dry    Mild, bilat (Optometrist, DMayford Knifeat MK[employer] Energyof [place]  . Mild cognitive impairment    MCI, likley vascular etiology, + MDD,  recurrent-->neuropsych testing 06/2017.  Neurol to repeat testing 1 yr  . Mixed hyperlipidemia    statin and vascepa as of 04/2017  . Obesity   . Other and unspecified angina pectoris   . PAD (peripheral artery disease) ([place] 02/2016   Abnormal ABI's and waveforms: LE arterial duplex ordered for f/u as per cardiologist's recommendation.  Vasc eval by Dr. CBridgett Larsson impression was minimal PAD, recommended maximize med mgmt.  .Marland KitchenPAF (paroxysmal atrial fibrillation) ([place] 10/2014   xarelto  . Pericardial effusion with cardiac tamponade 6//29/15   pericardiocentesis was done,  Infectious/inflamm (cytology showed NO MALIGNANT CELLS)  . Pleural plaque    Pleural plaques/asbestosis changes on CT chest done by pulm 05/2016.  .Marland KitchenPneumonia   . Tobacco dependence in remission    100+ pack-yr hx: quit after CABG    Past Surgical History:  Procedure Laterality Date  . APPENDECTOMY  1957  . CARDIAC CATHETERIZATION  08/08/2013  . CATARACT EXTRACTION W/ INTRAOCULAR LENS  IMPLANT, BILATERAL  04/08/2006 & 04/22/2006  . CATARACT EXTRACTION W/ INTRAOCULAR LENS  IMPLANT, BILATERAL Bilateral   . CHOLECYSTECTOMY OPEN  1999  . COLONOSCOPY  10/16/2011   Procedure: COLONOSCOPY;  Surgeon: RInda Castle MD;  [facility];  Service: Endoscopy;  Laterality: N/A;  . CORONARY ARTERY BYPASS GRAFT N/A 08/14/2013   Procedure: CORONARY ARTERY BYPASS GRAFTING (CABG);  Surgeon: SMelrose Nakayama MD;  [place] [facility]  Service: Open Heart Surgery;  Laterality: N/A;  Times 4   using left internal mammary artery and endoscopically harvested bilateral saphenous vein  . ESOPHAGOGASTRODUODENOSCOPY  02/25/12   Atrophic gastritis with a few erosions--capsule endoscopy planned as of 02/25/12 (Dr. KDeatra Ina.  .Marland KitchenHARDWARE REMOVAL Right 08/12/2012   Procedure: HARDWARE REMOVAL;  Surgeon: CMcarthur Rossetti MD;  [facility];  Service: Orthopedics;  Laterality: Right;  . HIP SURGERY Right 1954   Repair of slipped capital femoral  epiphysis.  . INTRAOPERATIVE TRANSESOPHAGEAL ECHOCARDIOGRAM N/A 08/14/2013   Normal LV function. Procedure: INTRAOPERATIVE TRANSESOPHAGEAL ECHOCARDIOGRAM;  Surgeon: SMelrose Nakayama MD;  [place] [facility]  Service: Open Heart Surgery;  Laterality: N/A;  . LEFT HEART CATHETERIZATION WITH CORONARY ANGIOGRAM N/A 08/08/2013   Procedure: LEFT HEART CATHETERIZATION WITH CORONARY ANGIOGRAM;  Surgeon: JJettie Booze MD;  [facility];  Service: Cardiovascular;  Laterality: N/A;  . PATELLA FRACTURE SURGERY Left ~ 1979   bolt + 3 screws to repair tib plateau fx  . PERICARDIAL TAP N/A 07/01/2013   Procedure: PERICARDIAL TAP;  Surgeon: JJettie Booze MD;  [facility];  Service: Cardiovascular;  Laterality: N/A;  . RIGHT/LEFT HEART CATH AND CORONARY ANGIOGRAPHY N/A 06/10/2016   Procedure: Right/Left Heart Cath and Coronary Angiography;  Surgeon: MLarey Dresser MD;  [facility];  Service: Cardiovascular;  Laterality: N/A;  . TESTICLE SURGERY  as a child   Undescended testicle  brought down into scrotum  . TONSILLECTOMY  1947  . TOTAL HIP ARTHROPLASTY Right 08/12/2012   Procedure: REMOVAL OF OLD PINS RIGHT HIP AND RIGHT TOTAL HIP ARTHROPLASTY ANTERIOR APPROACH;  Surgeon: Mcarthur Rossetti, MD;  [facility];  Service: Orthopedics;  Laterality: Right;  . TRANSESOPHAGEAL ECHOCARDIOGRAM  09/22/2016   EF 40-45%; diffuse hypokinesis, Grd I DD.  [name] TRANSTHORACIC ECHOCARDIOGRAM  10/30/14   Mod LVH, EF 60-65%, normal wall motion, mod mitral regurg, mild PAH  . TRANSTHORACIC ECHOCARDIOGRAM  06/2016; 10/01/17   06/2016: EF 25-30%, global hypokinesis, grd II DD, vent septum motion changes c/w LBBB, mod MV regurg, L atr mod/sev dilat, R atr mod dilat, mod incr pulm press, Grd II DD.  09/2017: EF 50-55%, Hypokinesis of the basal inferolateral wall, grd I DD, mild MR, +LAE.    Social History   Socioeconomic History  . Marital status: Married    Spouse name: [name]  .  Number of children: 4  . Years of education: 10  . Highest education level: Not on file  Occupational History  . Occupation: retired, former Scientist, forensic  Social Needs  . Financial resource strain: Not hard at all  . Food insecurity:    Worry: Never true    Inability: Never true  . Transportation needs:    Medical: No    Non-medical: No  Tobacco Use  . Smoking status: Former Smoker    Packs/day: 1.00    Years: 64.00    Pack years: 64.00    Types: Cigarettes    Last attempt to quit: 03/03/2018    Years since quitting: 0.0  . Smokeless tobacco: Former Systems developer    Types: [place] date: 06/30/2013  Substance and Sexual Activity  . Alcohol use: No    Alcohol/week: 0.0 standard drinks  . Drug use: No  . Sexual activity: Never  Lifestyle  . Physical activity:    Days per week: Not on file    Minutes per session: Not on file  . Stress: Not on file  Relationships  . Social connections:    Talks on phone: Not on file    Gets together: Not on file    Attends religious service: Not on file    Active member of club or organization: Not on file    Attends meetings of clubs or organizations: Not on file    Relationship status: Not on file  . Intimate partner violence:    Fear of current or ex partner: Not on file    Emotionally abused: Not on file    Physically abused: Not on file    Forced sexual activity: Not on file  Other Topics Concern  . Not on file  Social History Narrative   Married, 4 children.   Formerly a Medical illustrator.   Level of education: HS.     +tobacco--lifelong/ quit 2015).  No alcohol or drugs.   No exercise.  +excessive caffeine.      Lives in 1 story home with his wife and her sister    Allergies  Allergen Reactions  . Morphine And Related Other (See Comments)    Drenched with perspiration  . Entresto [Sacubitril-Valsartan]     hypotension  . Demerol [Meperidine] Nausea Only  . Starlix [Nateglinide] Other (See Comments)    gassy     Family History  Problem Relation Age of Onset  . Heart disease Father   . Heart attack Father   . CVA Mother   .  Hypertension Mother   . Diabetes Paternal Grandmother   . Breast cancer Sister   . Diabetes Maternal Uncle   . Heart disease Maternal Uncle   . Stroke Neg Hx     Prior to Admission medications   Medication Sig Start Date End Date Taking? Authorizing Provider  acetaminophen (TYLENOL) 325 MG tablet Take 2 tablets (650 mg total) by mouth every 4 (four) hours as needed for headache or mild pain. 08/09/13  Yes Kilroy, Luke K, PA-C  albuterol (PROVENTIL) (2.5 MG/3ML) 0.083% nebulizer solution Take 3 mLs (2.5 mg total) by nebulization every 6 (six) hours as needed for wheezing or shortness of breath. 01/11/18  Yes McGowen, Adrian Blackwater, MD  Ascorbic Acid (VITAMIN C) 1000 MG tablet Take 1,000 mg by mouth daily.   Yes [provider]  atorvastatin (LIPITOR) 40 MG tablet TAKE ONE TABLET BY MOUTH DAILY Patient taking differently: Take 40 mg by mouth daily at 6 PM.  03/25/18  Yes McGowen, Adrian Blackwater, MD  bisoprolol (ZEBETA) 5 MG tablet Take 1 tablet (5 mg total) by mouth daily. 11/26/17 11/26/18 Yes McGowen, Adrian Blackwater, MD  Cholecalciferol (VITAMIN D-3) 1000 units CAPS Take 1,000 Units by mouth daily. Reported on 02/26/2015   Yes [provider]  fluticasone (FLONASE) 50 MCG/ACT nasal spray Place 2 sprays into both nostrils daily. Patient taking differently: Place 2 sprays into both nostrils as needed for allergies.  06/04/16  Yes McGowen, Adrian Blackwater, MD  gabapentin (NEURONTIN) 100 MG capsule TAKE TWO CAPSULES BY MOUTH THREE TIMES DAILY Patient taking differently: Take 300 mg by mouth 2 (two) times daily.  08/09/17  Yes McGowen, Adrian Blackwater, MD  HUMULIN R 500 UNIT/ML injection Inject 0.60m under the skin 3 times daily. Patient taking differently: Inject 8-10 Units into the skin 3 (three) times daily with meals. 10 units in the morning. 8 units at noon. 8 units at night. 02/17/18  Yes  KElayne Snare MD  isosorbide mononitrate (IMDUR) 30 MG 24 hr tablet TAKE ONE TABLET BY MOUTH DAILY Patient taking differently: Take 30 mg by mouth daily.  01/14/18  Yes McGowen, PAdrian Blackwater MD  mirtazapine (REMERON) 45 MG tablet Take 1 tablet (45 mg total) by mouth at bedtime. 10/19/17  Yes McGowen, PAdrian Blackwater MD  Multiple Vitamin (MULTIVITAMIN WITH MINERALS) TABS Take 1 tablet by mouth daily.   Yes [provider]  nitroGLYCERIN (NITROSTAT) 0.4 MG SL tablet PLACE 1 TABLET UNDER THE TONGUE EVERY 5 MINUTES AS NEEDED FOR CHEST PAIN Patient taking differently: Place 0.4 mg under the tongue every 5 (five) minutes as needed for chest pain.  03/30/17  Yes MLarey Dresser MD  predniSONE (DELTASONE) 10 MG tablet Take 2 tablets (20 mg total) by mouth daily with breakfast for 5 days. 03/31/18 04/05/18 Yes NFenton Foy NP  PROAIR HFA 108 (90 Base) MCG/ACT inhaler INHALE 2 PUFFS INTO THE LUNGS EVERY 4 HOURS AS NEEDED FOR WHEEZING ORSHORTNESS OF BREATH Patient taking differently: Inhale 2 puffs into the lungs every 4 (four) hours as needed for wheezing or shortness of breath.  06/05/16  Yes McGowen, PAdrian Blackwater MD  sertraline (ZOLOFT) 25 MG tablet TAKE TWO TABLETS BY MOUTH AT BEDTIME Patient taking differently: Take 50 mg by mouth at bedtime.  03/25/18  Yes McGowen, PAdrian Blackwater MD  SYMBICORT 160-4.5 MCG/ACT inhaler INHALE 2 PUFFS INTO THE LUNGS TWICE DAILY Patient taking differently: Inhale 2 puffs into the lungs 2 (two) times daily.  12/01/16  Yes McGowen, PAdrian Blackwater MD  SYNJARDY 05-998 MG TABS TAKE ONE TABLET BY MOUTH TWICE DAILY AFTER A MEAL Patient taking differently: Take 1 tablet by mouth 2 (two) times daily.  02/28/18  Yes Elayne Snare, MD  tamsulosin (FLOMAX) 0.4 MG CAPS capsule TAKE ONE CAPSULE BY MOUTH DAILY AFTER SUPPER Patient taking differently: Take 0.4 mg by mouth daily after supper. TAKE ONE CAPSULE BY MOUTH DAILY AFTER SUPPER 10/29/17  Yes McGowen, Adrian Blackwater, MD  torsemide (DEMADEX) 20 MG tablet  Take 1 tablet (20 mg total) by mouth daily. May take 1 tab as needed 02/24/18  Yes Georgiana Shore, NP  VASCEPA 1 g CAPS TAKE TWO CAPSULES BY MOUTH TWICE DAILY Patient taking differently: Take 2 g by mouth 2 (two) times daily.  02/07/18  Yes Elayne Snare, MD  XARELTO 20 MG TABS tablet TAKE ONE TABLET BY MOUTH DAILY WITH SUPPER Patient taking differently: Take 20 mg by mouth daily with supper.  10/28/17  Yes Larey Dresser, MD  ACCU-CHEK GUIDE test strip USE TO check blood sugar THREE TIMES DAILY 03/25/18   Elayne Snare, MD  Insulin Syringe-Needle U-100 (INSULIN SYRINGE 1CC/31GX5/16") 31G X 5/16" 1 ML MISC USE AS INSTRUCTED TO INJECT INSULIN. 03/10/18   Elayne Snare, MD  ipratropium (ATROVENT) 0.03 % nasal spray Place 2 sprays into both nostrils 2 (two) times daily. Patient not taking: Reported on 03/19/2018 01/04/18   Tammi Sou, MD    Physical Exam: Vitals:   04/05/2018 1315 04/04/2018 1425 03/29/2018 1445 03/06/2018 1501  BP:   133/80 128/63  Pulse:  92  89  Resp: (!) 27 (!) 27 (!) 30 19  Temp:      TempSrc:      SpO2:  97%  95%  Weight:      Height:         . General:  Appears calm and comfortable and is NAD; mildly ill-appearing on 100% NRB but he looks surprisingly good in these circumstances . Eyes:  PERRL, EOMI, normal lids, iris; chronically injected conjunctivae . ENT:  grossly normal hearing, lips & tongue, mmm; artificial dentition . Neck:  no LAD, masses or thyromegaly; no carotid bruits . Cardiovascular:  RRR, no m/r/g. No LE edema.  [name] Respiratory:  Diffuse rhonchi through 2/3 of lower lung fields.  Mildly increased respiratory effort without current distress. . Abdomen:  soft, NT, ND, NABS . Back:   normal alignment, no CVAT . Skin:  no rash or induration seen on limited exam . Musculoskeletal:  grossly normal tone BUE/BLE, good ROM, no bony abnormality . Psychiatric:  grossly normal mood and affect, speech fluent and appropriate, AOx3 . Neurologic:  CN 2-12 grossly intact,  moves all extremities in coordinated fashion, sensation intact    Radiological Exams on Admission: Dg Chest Port 1 View  Result Date: 03/06/2018 CLINICAL DATA:  Wheezing for 1 week. EXAM: PORTABLE CHEST 1 VIEW COMPARISON:  PA and lateral chest 01/11/2018, 12/22/2017 and 08/27/2016. CT chest 06/02/2016. FINDINGS: Airspace disease throughout the right chest is most dense in the right upper lobe. There is also airspace disease in the left lung base. Heart size is upper normal. The patient is status post CABG. No pneumothorax or pleural effusion. IMPRESSION: Right much worse than left airspace disease most consistent with multifocal pneumonia. Electronically Signed   By: Inge Rise M.D.   On: 03/26/2018 12:56    EKG: Independently reviewed.  Afib with rate 98; LBBB  Labs on Admission: I have personally reviewed the available labs and imaging studies  at the time of the admission.  Pertinent labs:   ABG: 7.330/50.9/110 on 100% NRB Glucose 167 BN 33/Creatinine 1.23/GFR 56 - stable Anion gap 16 Albumin 3.1 AST 45/ALT 32/Bili 1.0 Lactate 1.2 WBC 7.8 Hgb 12.1 UA: >500 glucose; small Hgb; 5 ketones; 30 protein; rare bacteria; large LE A1c 6.8 on 3/24  Assessment/Plan Principal Problem:   Acute on chronic respiratory failure with hypoxia (HCC) Active Problems:   HTN (hypertension), benign   Tobacco dependence   COPD (chronic obstructive pulmonary disease) (HCC)   CKD (chronic kidney disease) stage 3, GFR 30-59 ml/min (HCC)   PAF (paroxysmal atrial fibrillation) (HCC)   Diabetes mellitus with complication (HCC)   Acute respiratory failure with hypoxia -Patient with presenting with progressive cough, SOB; cough productive of clear sputum -No GI symptoms -He has an in-home contact (SIL) who is currently quarantined -He currently requiring 100% NRB O2 -This patient is considered high risk for COVID at this time -Pertinent labs concerning for COVID include normal WBC count with  lymphopenia; mildly increased LFTs -CXR with multifocal opacities which may be c/w COVID vs. Multifocal PNA; CT chest was not performed  -Patient was seen wearing full PPE including: gown, gloves, head cover, N95, and face shield; donning and doffing was observed to be in compliance with current standards. -Patient log was signed and witnessed -Will admit for ongoing supportive care -Monitor on telemetry x 24 hours -At this time, will attempt to avoid use of aerosolized medications and use HFAs instead -Patient is clear that he would not desire CPR or intubation/mechanical ventilation. -Dr. Ralene Bathe spoke with the patient's wife and she is aware of the critical nature of the patient's condition. -Even if "simply" multifocal pneumonia, his PSI is 168, class V, with an estimated mortality rate of 27%. -Will attempt to hold NSAIDs and steroids due to anecdotal evidence of worse outcomes with COVID. -Treat multifocal PNA as HCAP due to Dec 2019 admission as well as critical nature of illness with Cefepime/Vanc for now.  CKD stage 3 -Patient with stage 3 CKD , appears to be stable at this time -He does appear to have mild intravascular depletion (ketonuria, increased anion gap) but COVID patients have been shown to have worse outcomes when given aggressive IVF and so will hold IVF as well as diuretics for now  HTN -Continue Bisoprolol  HLD -Continue Lipitor, Vascepa  Afib -Rate controlled with bisoprolol -Continue Xarelto  DM -Recent A1c 8.3, indicating suboptimal control -Continue TID U500 -Will cover with moderate-scale SSI -Hold Synjardy  Chronic CHF -1/61 echo with systolic CHF (EF 09-60%) -Holding diuretics -Monitor for evidence of volume overload   COPD with ongoing Tobacco dependence -He resumed smoking. -Encourage cessation.   -Patch declined by patient. -Continue Symbicort (formulary substitution to [employer]), Albuterol HFA -Add standing Combivent HFA  DVT  prophylaxis:  Xarelto Code Status:  DNR - confirmed with patient/family Family Communication: None present; Wife was updated on patient condition by Dr. Ralene Bathe just prior to my evaluation Disposition Plan:  Home once clinically improved Consults called: None  Admission status: Admit - It is my clinical opinion that admission to INPATIENT is reasonable and necessary because of the expectation that this patient will require hospital care that crosses at least 2 midnights to treat this condition based on the medical complexity of the problems presented.  Given the aforementioned information, the predictability of an adverse outcome is felt to be significant.     Karmen Bongo MD [facility]   How to contact  the [facility] Attending or Consulting provider [employer] or covering provider during after hours [place], for this patient?  1. Check the care team in [facility] and look for a) attending/consulting [facility] provider listed and b) the [facility] team listed 2. Log into [email or web address] and use [facility] [place]'s universal password to access. If you do not have the password, please contact the hospital operator. 3. Locate the [facility] provider you are looking for under [facility] and page to a number that you can be directly reached. 4. If you still have difficulty reaching the provider, please page the [facility] (Director on Call) for the Hospitalists listed on amion for assistance.   03/13/2018, 3:09 PM

## 2018-04-01 NOTE — ED Notes (Signed)
  Attempted IV in left hand unsucessful. Blood able to be drawn for 1 culture blue top. Second set of cultures from right forearm. Cultures collected before antibiotics started. RN taking care of patient made aware.

## 2018-04-01 NOTE — ED Notes (Signed)
Pt being transported to 2W.

## 2018-04-01 NOTE — Progress Notes (Signed)
Pharmacy Antibiotic Note  Joshua [name] is a 78 y.o. male admitted on 03/28/2018 with pneumonia.  Pharmacy has been consulted for vancomycin and cefepime dosing.  Plan: Cefepime 2 gm IV q8 hr Vancomycin 2 gm loading dose Vancomycin 1750 mg IV Q 24 hrs. Goal AUC 400-550. Expected AUC: 507.7 Css max 42, Css min 9.9 SCr used: 1.23 F/u renal function,WBC, temp, culture data Vancomycin levels as needed  Height: _0  (180.3 cm) Weight: 232 lb (105.2 kg) IBW/kg (Calculated) : 75.3  Temp (24hrs), Avg:100.2 F (37.9 C), Min:99.6 F (37.6 C), Max:100.8 F (38.2 C)  Recent Labs  Lab 03/14/2018 1209 03/26/2018 1213  WBC 7.8  --   CREATININE 1.23  --   LATICACIDVEN  --  1.2    Estimated Creatinine Clearance: 61.1 mL/min (by C-G formula based on SCr of 1.23 mg/dL).    Allergies  Allergen Reactions  . Morphine And Related Other (See Comments)    Drenched with perspiration  . Entresto [Sacubitril-Valsartan]     hypotension  . Demerol [Meperidine] Nausea Only  . Starlix [Nateglinide] Other (See Comments)    gassy    Antimicrobials this admission: 3/27 Vanc>> 3/27 Cefepime>> 3/27 CTX x 1 3/27 azith x 1  Dose adjustments this admission:  Microbiology results: 3/27 BCx2 >>sent 3/27 Ucx> sent  Thank you for allowing pharmacy to be a part of this patient's care.  Eudelia Bunch, Pharm.D 03/28/2018 3:31 PM

## 2018-04-01 NOTE — ED Notes (Addendum)
Attempted to give report x2. RN unavailable. Rn will call back

## 2018-04-01 NOTE — ED Notes (Addendum)
ED TO INPATIENT HANDOFF REPORT  ED Nurse Name and Phone #: Judson Roch, RN [phone]  S Name/Age/Gender Joshua [name] 78 y.o. male Room/Bed: WA17/WA17  Code Status   Code Status: DNR  Home/SNF/Other Home Patient oriented to: self Is this baseline? Yes   Triage Complete: Triage complete  Chief Complaint short of breath   Triage Note Pt presents with wheezing going on for a week due to non compliance with medications (per pt). Hx of COPD   Allergies Allergies  Allergen Reactions  . Morphine And Related Other (See Comments)    Drenched with perspiration  . Entresto [Sacubitril-Valsartan]     hypotension  . Demerol [Meperidine] Nausea Only  . Starlix [Nateglinide] Other (See Comments)    gassy    Level of Care/Admitting Diagnosis ED Disposition    ED Disposition Condition Comment   Admit  Hospital Area: [facility] [[id number]]  Level of Care: Stepdown [14]  Admit to SDU based on following criteria: Respiratory Distress:  Frequent assessment and/or intervention to maintain adequate ventilation/respiration, pulmonary toilet, and respiratory treatment.  Diagnosis: Acute on chronic respiratory failure with hypoxia [facility]) [[id number]]  Admitting Physician: Karmen Bongo [[id number]]  Attending Physician: Karmen Bongo [[id number]]  Estimated length of stay: 3 - 4 days  Certification:: I certify this patient will need inpatient services for at least 2 midnights  PT Class (Do Not Modify): Inpatient [101]  PT Acc Code (Do Not Modify): Private [1]       B Medical/Surgery History Past Medical History:  Diagnosis Date  . Adenomatous colon polyp 10/16/2011   Repeat 2018  . Arthritis     Hips, R>L & KNEES  . CAD, multiple vessel    3V CAD cath 08/08/13----CABG done shortly after.  Cath 06/2016: clear bipasses/no interventional lesion.  . 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 pylori neg, no dysplasia or malignancy.  . Chronic combined  systolic and diastolic CHF, NYHA class 2 ([place]) 2016-2018   Followed by [facility].  Transesoph Echo 09/2016 EF improved to 40-45%; diffuse hypokinesis, Grd I DD.  09/2017 EF 50-55%,hypokin basal inferolateral wall, grd I DD,mild MR.  . Chronic renal insufficiency, stage 3 (moderate) ([place]) 2018   GFR 50s  . COPD (chronic obstructive pulmonary disease) ([place])    GOLD II.  Spirometry  2004 borderline obstruction; 2015 mod obst: noncompliant with bid symbicort so pulmonologist switched him to a once daily inhaler: Trelegy ellipta 12/2015.  Oxygen prn: goal 88-92%.  . Diabetic nephropathy ([place])    Elevated urine microalb/cr 03/2011  . DM type 2 (diabetes mellitus, type 2) ([place])    Poor control on max oral meds--pt eventually agreed to insulin therapy.  As of 2017 his DM is being managed by Dr. Dwyane Dee in endo.  On insulin pump as of 04/2017 endo f/u.  [name] Dysthymia    Worsened after 2015 heart surgery.  Memory loss +? as well.  Neuro eval 01/2017--plan is to get MRI brain, B12 level, and neuropsychiatric testing.  . Erectile dysfunction    Normal testosterone  . Hearing loss of both ears 2016   Hearing aids  . Hyperplastic colon polyp 2001   No further colonoscopies to be done as of 11/2016 GI eval.  . Hypertension   . IBS (irritable bowel syndrome)    responds relatively well to prn levsin.  Pepto bismol q AM trial by Dr. Loletha Carrow 08/2017, also checking fecal elastace and lactoferrin.  . Iron deficiency anemia 2014  03/2012 capsule endoscopy showed 2 AVMs--likely responsible for his IDA--lifetime iron supp recommended + q37moCBCs.  . Ischemic cardiomyopathy 06/2016; 09/2016   06/2016: EF 25-30%, global hypokinesis, grd II DD, vent septum motion changes c/w LBBB, mod MV regurg, L atr mod/sev dilat, R atr mod dilat, mod incr pulm press, Grd II DD.  09/2016--EF 40-45%; diffuse hypokinesis, Grd I DD.  . Macular degeneration, dry    Mild, bilat (Optometrist, DMayford Knifeat [employer] [place]  . Mild cognitive impairment    MCI, likley vascular etiology, + MDD, recurrent-->neuropsych testing 06/2017.  Neurol to repeat testing 1 yr  . Mixed hyperlipidemia    statin and vascepa as of 04/2017  . Obesity   . Other and unspecified angina pectoris   . PAD (peripheral artery disease) ([place] 02/2016   Abnormal ABI's and waveforms: LE arterial duplex ordered for f/u as per cardiologist's recommendation.  Vasc eval by Dr. CBridgett Larsson impression was minimal PAD, recommended maximize med mgmt.  .Marland KitchenPAF (paroxysmal atrial fibrillation) ([place] 10/2014   xarelto  . Pericardial effusion with cardiac tamponade 6//29/15   pericardiocentesis was done,  Infectious/inflamm (cytology showed NO MALIGNANT CELLS)  . Pleural plaque    Pleural plaques/asbestosis changes on CT chest done by pulm 05/2016.  .Marland KitchenPneumonia   . Tobacco dependence in remission    100+ pack-yr hx: quit after CABG   Past Surgical History:  Procedure Laterality Date  . APPENDECTOMY  1957  . CARDIAC CATHETERIZATION  08/08/2013  . CATARACT EXTRACTION W/ INTRAOCULAR LENS  IMPLANT, BILATERAL  04/08/2006 & 04/22/2006  . CATARACT EXTRACTION W/ INTRAOCULAR LENS  IMPLANT, BILATERAL Bilateral   . CHOLECYSTECTOMY OPEN  1999  . COLONOSCOPY  10/16/2011   Procedure: COLONOSCOPY;  Surgeon: RInda Castle MD;  [facility]: [facility];  Service: Endoscopy;  Laterality: N/A;  . CORONARY ARTERY BYPASS GRAFT N/A 08/14/2013   Procedure: CORONARY ARTERY BYPASS GRAFTING (CABG);  Surgeon: SMelrose Nakayama MD;  Location: [facility]  Service: Open Heart Surgery;  Laterality: N/A;  Times 4   using left internal mammary artery and endoscopically harvested bilateral saphenous vein  . ESOPHAGOGASTRODUODENOSCOPY  02/25/12   Atrophic gastritis with a few erosions--capsule endoscopy planned as of 02/25/12 (Dr. KDeatra Ina.  .Marland KitchenHARDWARE REMOVAL Right 08/12/2012   Procedure: HARDWARE REMOVAL;  Surgeon: CMcarthur Rossetti MD;  [facility];  Service: Orthopedics;   Laterality: Right;  . HIP SURGERY Right 1954   Repair of slipped capital femoral epiphysis.  . INTRAOPERATIVE TRANSESOPHAGEAL ECHOCARDIOGRAM N/A 08/14/2013   Normal LV function. Procedure: INTRAOPERATIVE TRANSESOPHAGEAL ECHOCARDIOGRAM;  Surgeon: SMelrose Nakayama MD;  [place]  Service: Open Heart Surgery;  Laterality: N/A;  . LEFT HEART CATHETERIZATION WITH CORONARY ANGIOGRAM N/A 08/08/2013   Procedure: LEFT HEART CATHETERIZATION WITH CORONARY ANGIOGRAM;  Surgeon: JJettie Booze MD;  [facility];  Service: Cardiovascular;  Laterality: N/A;  . PATELLA FRACTURE SURGERY Left ~ 1979   bolt + 3 screws to repair tib plateau fx  . PERICARDIAL TAP N/A 07/01/2013   Procedure: PERICARDIAL TAP;  Surgeon: JJettie Booze MD;  [facility];  Service: Cardiovascular;  Laterality: N/A;  . RIGHT/LEFT HEART CATH AND CORONARY ANGIOGRAPHY N/A 06/10/2016   Procedure: Right/Left Heart Cath and Coronary Angiography;  Surgeon: MLarey Dresser MD;  [facility] [place] [facility];  Service: Cardiovascular;  Laterality: N/A;  . TESTICLE SURGERY  as a child   Undescended testicle brought down into scrotum  .  TONSILLECTOMY  1947  . TOTAL HIP ARTHROPLASTY Right 08/12/2012   Procedure: REMOVAL OF OLD PINS RIGHT HIP AND RIGHT TOTAL HIP ARTHROPLASTY ANTERIOR APPROACH;  Surgeon: Mcarthur Rossetti, MD;  [facility];  Service: Orthopedics;  Laterality: Right;  . TRANSESOPHAGEAL ECHOCARDIOGRAM  09/22/2016   EF 40-45%; diffuse hypokinesis, Grd I DD.  [name] TRANSTHORACIC ECHOCARDIOGRAM  10/30/14   Mod LVH, EF 60-65%, normal wall motion, mod mitral regurg, mild PAH  . TRANSTHORACIC ECHOCARDIOGRAM  06/2016; 10/01/17   06/2016: EF 25-30%, global hypokinesis, grd II DD, vent septum motion changes c/w LBBB, mod MV regurg, L atr mod/sev dilat, R atr mod dilat, mod incr pulm press, Grd II DD.  09/2017: EF 50-55%, Hypokinesis of the basal inferolateral wall, grd I DD, mild MR, +LAE.     A IV  Location/Drains/Wounds Patient Lines/Drains/Airways Status   Active Line/Drains/Airways    Name:   Placement date:   Placement time:   Site:   Days:   Peripheral IV 03/07/2018 Left Forearm   03/22/2018    1658    Forearm   less than 1   Incision (Closed) 08/14/13 Chest Other (Comment)   08/14/13    0903     1691   Incision (Closed) 08/14/13 Leg Right   08/14/13    0903     1691   Incision (Closed) 08/14/13 Leg Left   08/14/13    0903     1691   Wound / Incision (Open or Dehisced) 04/04/15 Other (Comment) Arm Right skin tear to right arm   04/04/15    1638    Arm   1093          Intake/Output Last 24 hours No intake or output data in the 24 hours ending 03/15/2018 2056  Labs/Imaging Results for orders placed or performed during the hospital encounter of 04/02/2018 (from the past 48 hour(s))  Comprehensive metabolic panel     Status: Abnormal   Collection Time: 03/31/2018 12:09 PM  Result Value Ref Range   Sodium 141 135 - 145 mmol/L   Potassium 3.5 3.5 - 5.1 mmol/L   Chloride 101 98 - 111 mmol/L   CO2 24 22 - 32 mmol/L   Glucose, Bld 167 (H) 70 - 99 mg/dL   BUN 33 (H) 8 - 23 mg/dL   Creatinine, Ser 1.23 0.61 - 1.24 mg/dL   Calcium 8.6 (L) 8.9 - 10.3 mg/dL   Total Protein 7.2 6.5 - 8.1 g/dL   Albumin 3.1 (L) 3.5 - 5.0 g/dL   AST 45 (H) 15 - 41 U/L   ALT 32 0 - 44 U/L   Alkaline Phosphatase 75 38 - 126 U/L   Total Bilirubin 1.0 0.3 - 1.2 mg/dL   GFR calc non Af Amer 56 (L) >60 mL/min   GFR calc Af Amer >60 >60 mL/min   Anion gap 16 (H) 5 - 15    Comment: Performed at [facility], [place] [address] [place], [place] [address]  CBC WITH DIFFERENTIAL     Status: Abnormal   Collection Time: 03/07/2018 12:09 PM  Result Value Ref Range   WBC 7.8 4.0 - 10.5 K/uL    Comment: WHITE COUNT CONFIRMED ON SMEAR   RBC 4.54 4.22 - 5.81 MIL/uL   Hemoglobin 12.1 (L) 13.0 - 17.0 g/dL   HCT 40.3 39.0 - 52.0 %   MCV 88.8 80.0 - 100.0 fL   MCH 26.7 26.0 - 34.0 pg   MCHC 30.0 30.0 -  36.0  g/dL   RDW 16.6 (H) 11.5 - 15.5 %   Platelets 204 150 - 400 K/uL   nRBC 0.0 0.0 - 0.2 %   Neutrophils Relative % 83 %   Neutro Abs 6.5 1.7 - 7.7 K/uL   Lymphocytes Relative 6 %   Lymphs Abs 0.5 (L) 0.7 - 4.0 K/uL   Monocytes Relative 8 %   Monocytes Absolute 0.6 0.1 - 1.0 K/uL   Eosinophils Relative 2 %   Eosinophils Absolute 0.2 0.0 - 0.5 K/uL   Basophils Relative 0 %   Basophils Absolute 0.0 0.0 - 0.1 K/uL   Immature Granulocytes 1 %   Abs Immature Granulocytes 0.04 0.00 - 0.07 K/uL    Comment: Performed at [facility], [place] [address], [place], [place] [address]  Brain natriuretic peptide     Status: Abnormal   Collection Time: 03/15/2018 12:09 PM  Result Value Ref Range   B Natriuretic Peptide 1,338.8 (H) 0.0 - 100.0 pg/mL    Comment: Performed at [facility], [place] [address] [place], [place] [address]  Lactic acid, plasma     Status: None   Collection Time: 03/21/2018 12:13 PM  Result Value Ref Range   Lactic Acid, Venous 1.2 0.5 - 1.9 mmol/L    Comment: Performed at [facility], [place] [address] [place] [address], [place], [place] [address]  Urinalysis, Routine w reflex microscopic     Status: Abnormal   Collection Time: 03/14/2018 12:13 PM  Result Value Ref Range   Color, Urine YELLOW YELLOW   APPearance HAZY (A) CLEAR   Specific Gravity, Urine 1.014 1.005 - 1.030   pH 5.0 5.0 - 8.0   Glucose, UA >=500 (A) NEGATIVE mg/dL   Hgb urine dipstick SMALL (A) NEGATIVE   Bilirubin Urine NEGATIVE NEGATIVE   Ketones, ur 5 (A) NEGATIVE mg/dL   Protein, ur 30 (A) NEGATIVE mg/dL   Nitrite NEGATIVE NEGATIVE   Leukocytes,Ua LARGE (A) NEGATIVE   RBC / HPF 11-20 0 - 5 RBC/hpf   WBC, UA 21-50 0 - 5 WBC/hpf   Bacteria, UA RARE (A) NONE SEEN   Squamous Epithelial / LPF 0-5 0 - 5   Mucus PRESENT    Hyaline Casts, UA PRESENT     Comment: Performed at [facility], [place] [address] [place], [place] [address]  Blood gas, arterial ([facility],  [facility], [facility])     Status: Abnormal   Collection Time: 03/31/2018  1:00 PM  Result Value Ref Range   FIO2 100.00    Delivery systems OXYGEN MASK    pH, Arterial 7.330 (L) 7.350 - 7.450   pCO2 arterial 50.9 (H) 32.0 - 48.0 mmHg   pO2, Arterial 110 (H) 83.0 - 108.0 mmHg   Bicarbonate 26.1 20.0 - 28.0 mmol/L   Acid-Base Excess 0.0 0.0 - 2.0 mmol/L   O2 Saturation 96.9 %   Patient temperature 98.6    Collection site BRACHIAL ARTERY    Drawn by [id number]    Sample type ARTERIAL     Comment: Performed at [facility], [place] [address], [place], [place] [address]  Lactic acid, plasma     Status: None   Collection Time: 03/28/2018  3:00 PM  Result Value Ref Range   Lactic Acid, Venous 0.8 0.5 - 1.9 mmol/L    Comment: Performed at [facility], [place] [address], [place], [place] [address]   Dg Chest Port 1 View  Result Date: 03/26/2018 CLINICAL DATA:  Wheezing for 1 week. EXAM: PORTABLE CHEST 1 VIEW COMPARISON:  PA and lateral chest 01/11/2018, 12/22/2017 and 08/27/2016. CT chest 06/02/2016. FINDINGS: Airspace disease throughout the right chest is most dense in the right upper lobe. There is also airspace disease in the left lung base. Heart size is upper normal. The patient is status post CABG. No pneumothorax or pleural effusion. IMPRESSION: Right much worse than left airspace disease most consistent with multifocal pneumonia. Electronically Signed   By: Inge Rise M.D.   On: 03/27/2018 12:56    Pending Labs Unresulted Labs (From admission, onward)    Start     Ordered   04/02/18 0500  CBC with Differential/Platelet  Daily,   R     03/17/2018 1459   04/02/18 0500  Comprehensive metabolic panel  Daily,   R     03/29/2018 1459   03/27/2018 1458  Interleukin-6, Plasma  Once,   R     04/05/2018 1459   03/09/2018 1458  Procalcitonin  Once,   R     03/09/2018 1459   03/26/2018 1458  Ferritin  Once,   R     03/31/2018 1459   03/20/2018 1458  D-dimer, quantitative (not at [facility])   Once,   R     03/30/2018 1459   03/20/2018 1458  Sedimentation rate  Once,   R     03/21/2018 1459   03/13/2018 1458  Lactate dehydrogenase  Once,   R     04/04/2018 1459   03/11/2018 1213  Blood Culture (routine x 2)  BLOOD CULTURE X 2,   STAT     03/12/2018 1214   03/08/2018 1213  Urine culture  ONCE - STAT,   STAT     03/27/2018 1214          Vitals/Pain Today's Vitals   03/27/2018 1730 03/16/2018 1800 03/25/2018 1824 03/07/2018 2036  BP: (!) 129/55 127/61  111/65  Pulse:  82 85 89  Resp: (!) 22 20 (!) 22 18  Temp:   98.5 F (36.9 C)   TempSrc:   Oral   SpO2:  96% 95% 96%  Weight:      Height:      PainSc:   0-No pain     Isolation Precautions Airborne and Contact precautions  Medications Medications  atorvastatin (LIPITOR) tablet 40 mg (has no administration in time range)  bisoprolol (ZEBETA) tablet 5 mg (has no administration in time range)  isosorbide mononitrate (IMDUR) 24 hr tablet 30 mg (has no administration in time range)  mirtazapine (REMERON) tablet 45 mg (has no administration in time range)  sertraline (ZOLOFT) tablet 50 mg (has no administration in time range)  tamsulosin (FLOMAX) capsule 0.4 mg (has no administration in time range)  rivaroxaban (XARELTO) tablet 20 mg (has no administration in time range)  gabapentin (NEURONTIN) capsule 300 mg (has no administration in time range)  mometasone-formoterol (DULERA) 200-5 MCG/ACT inhaler 2 puff (has no administration in time range)  acetaminophen (TYLENOL) tablet 650 mg (has no administration in time range)  docusate sodium (COLACE) capsule 100 mg (has no administration in time range)  polyethylene glycol (MIRALAX / GLYCOLAX) packet 17 g (has no administration in time range)  bisacodyl (DULCOLAX) EC tablet 5 mg (has no administration in time range)  ondansetron (ZOFRAN) tablet 4 mg (has no administration in time range)    Or  ondansetron (ZOFRAN) injection 4 mg (has no administration in time range)  insulin aspart (novoLOG)  injection 0-15 Units (has no  administration in time range)  sodium chloride flush (NS) 0.9 % injection 3 mL (3 mLs Intravenous Given 03/16/2018 2045)  ceFEPIme (MAXIPIME) 2 g in sodium chloride 0.9 % 100 mL IVPB (has no administration in time range)  albuterol (PROVENTIL HFA;VENTOLIN HFA) 108 (90 Base) MCG/ACT inhaler 2-4 puff (has no administration in time range)  Ipratropium-Albuterol (COMBIVENT) respimat 1 puff (1 puff Inhalation Not Given 03/14/2018 1655)  ceFEPIme (MAXIPIME) 2 g in sodium chloride 0.9 % 100 mL IVPB (has no administration in time range)  vancomycin (VANCOCIN) 1,750 mg in sodium chloride 0.9 % 500 mL IVPB (has no administration in time range)  omega-3 acid ethyl esters (LOVAZA) capsule 1 g (has no administration in time range)  insulin regular human CONCENTRATED (HUMULIN R) 500 UNIT/ML kwikpen 10 Units (has no administration in time range)  albuterol (PROVENTIL HFA;VENTOLIN HFA) 108 (90 Base) MCG/ACT inhaler 8 puff (8 puffs Inhalation Given 03/09/2018 1246)  cefTRIAXone (ROCEPHIN) 1 g in sodium chloride 0.9 % 100 mL IVPB (0 g Intravenous Stopped 04/05/2018 1440)  azithromycin (ZITHROMAX) 500 mg in sodium chloride 0.9 % 250 mL IVPB (0 mg Intravenous Stopped 03/29/2018 1650)  vancomycin (VANCOCIN) 2,000 mg in sodium chloride 0.9 % 500 mL IVPB (0 mg Intravenous Stopped 03/08/2018 2005)    Mobility walks with person assist Moderate fall risk   Focused Assessments Neuro Assessment Handoff:  Swallow screen pass? Yes  Cardiac Rhythm: Atrial fibrillation       Neuro Assessment:   Neuro Checks:      Last Documented NIHSS Modified Score:   Has TPA been given? No If patient is a Neuro Trauma and patient is going to OR before floor call report to [place] nurse: [phone] or [phone]     R Recommendations: See Admitting Provider Note  Report given to:   Additional Notes:  Pt is a high risk possible covid per admitting MD

## 2018-04-01 NOTE — ED Notes (Signed)
Bodenheimer, MD paged regarding pt's plan of care.

## 2018-04-02 DIAGNOSIS — I480 Paroxysmal atrial fibrillation: Secondary | ICD-10-CM

## 2018-04-02 DIAGNOSIS — J438 Other emphysema: Secondary | ICD-10-CM

## 2018-04-02 DIAGNOSIS — F17200 Nicotine dependence, unspecified, uncomplicated: Secondary | ICD-10-CM

## 2018-04-02 DIAGNOSIS — I10 Essential (primary) hypertension: Secondary | ICD-10-CM

## 2018-04-02 LAB — CBC WITH DIFFERENTIAL/PLATELET
Abs Immature Granulocytes: 0.03 10*3/uL (ref 0.00–0.07)
BASOS PCT: 0 %
Basophils Absolute: 0 10*3/uL (ref 0.0–0.1)
EOS ABS: 0 10*3/uL (ref 0.0–0.5)
Eosinophils Relative: 0 %
HCT: 41.8 % (ref 39.0–52.0)
Hemoglobin: 12.2 g/dL — ABNORMAL LOW (ref 13.0–17.0)
Immature Granulocytes: 0 %
Lymphocytes Relative: 8 %
Lymphs Abs: 0.6 10*3/uL — ABNORMAL LOW (ref 0.7–4.0)
MCH: 26 pg (ref 26.0–34.0)
MCHC: 29.2 g/dL — ABNORMAL LOW (ref 30.0–36.0)
MCV: 89.1 fL (ref 80.0–100.0)
Monocytes Absolute: 0.7 10*3/uL (ref 0.1–1.0)
Monocytes Relative: 9 %
Neutro Abs: 6.5 10*3/uL (ref 1.7–7.7)
Neutrophils Relative %: 83 %
PLATELETS: 210 10*3/uL (ref 150–400)
RBC: 4.69 MIL/uL (ref 4.22–5.81)
RDW: 16.7 % — AB (ref 11.5–15.5)
WBC: 7.9 10*3/uL (ref 4.0–10.5)
nRBC: 0 % (ref 0.0–0.2)

## 2018-04-02 LAB — COMPREHENSIVE METABOLIC PANEL
ALT: 37 U/L (ref 0–44)
AST: 51 U/L — ABNORMAL HIGH (ref 15–41)
Albumin: 3.1 g/dL — ABNORMAL LOW (ref 3.5–5.0)
Alkaline Phosphatase: 72 U/L (ref 38–126)
Anion gap: 14 (ref 5–15)
BUN: 41 mg/dL — ABNORMAL HIGH (ref 8–23)
CHLORIDE: 103 mmol/L (ref 98–111)
CO2: 25 mmol/L (ref 22–32)
Calcium: 8.7 mg/dL — ABNORMAL LOW (ref 8.9–10.3)
Creatinine, Ser: 1.38 mg/dL — ABNORMAL HIGH (ref 0.61–1.24)
GFR calc Af Amer: 56 mL/min — ABNORMAL LOW (ref >60)
GFR calc non Af Amer: 49 mL/min — ABNORMAL LOW (ref >60)
Glucose, Bld: 298 mg/dL — ABNORMAL HIGH (ref 70–99)
Potassium: 3.8 mmol/L (ref 3.5–5.1)
Sodium: 142 mmol/L (ref 135–145)
Total Bilirubin: 1.1 mg/dL (ref 0.3–1.2)
Total Protein: 7 g/dL (ref 6.5–8.1)

## 2018-04-02 LAB — URINE CULTURE

## 2018-04-02 LAB — MRSA PCR SCREENING: MRSA BY PCR: NEGATIVE

## 2018-04-02 LAB — INFLUENZA PANEL BY PCR (TYPE A & B)
Influenza A By PCR: NEGATIVE
Influenza B By PCR: NEGATIVE

## 2018-04-02 LAB — GLUCOSE, CAPILLARY
Glucose-Capillary: 291 mg/dL — ABNORMAL HIGH (ref 70–99)
Glucose-Capillary: 375 mg/dL — ABNORMAL HIGH (ref 70–99)
Glucose-Capillary: 383 mg/dL — ABNORMAL HIGH (ref 70–99)
Glucose-Capillary: 256 mg/dL — ABNORMAL HIGH (ref 70–99)

## 2018-04-02 MED ORDER — SODIUM CHLORIDE 0.9 % IV SOLN
INTRAVENOUS | Status: DC | PRN
  Administered 2018-04-02 (×2): 250 mL via INTRAVENOUS

## 2018-04-02 MED ORDER — FUROSEMIDE 10 MG/ML IJ SOLN
40.0000 mg | Freq: Once | INTRAMUSCULAR | Status: AC
  Administered 2018-04-02: 40 mg via INTRAVENOUS
  Filled 2018-04-02: qty 4

## 2018-04-02 MED ORDER — VANCOMYCIN HCL 10 G IV SOLR: 1750.0000 mg | INTRAVENOUS | Status: DC

## 2018-04-02 NOTE — Progress Notes (Signed)
PROGRESS NOTE    Joshua [name]  CHE:[id number] DOB: December 17, 1940 DOA: 03/22/2018 PCP: Tammi Sou, MD    Brief Narrative:  78 y.o. male with medical history significant of afib on Xarelto; PAD; pericardial effusion with tamponade in 2015; HLD; dementia; IBS; HTN; CAD s/p CABG; DM; COPD; stage 3 CKD; and chronic combined CHF presenting with SOB.   He reports increased WOB and cough starting about 10 days and progressing.  He denies fever.   His sister-in-law lives with him and his wife and has also had flu-like illness, for which she is quarantined.  He reports that he is feeling somewhat better now.   At time of admission, pt was clear that he does not wish to be intubated or have CPR should the situation arise.   ED Course:   COPD on home O2, presenting with SOB.  Has a sick contact (sister-in-law) who is "quarantined" in his home.  78% on RA.  Given Albuterol.  On NRB, mid-90s.  Increased WOB, wheezes, rhonchi.  Multifocal PNA - give Rocephin, Azithromycin.  Normal CBC.  No CPR/intubation.  Had one dose of oral prednisone this AM after tele-visit with pulm day prior to admit  Assessment & Plan:   Principal Problem:   Acute on chronic respiratory failure with hypoxia (HCC) Active Problems:   HTN (hypertension), benign   Tobacco dependence   COPD (chronic obstructive pulmonary disease) (HCC)   CKD (chronic kidney disease) stage 3, GFR 30-59 ml/min (HCC)   PAF (paroxysmal atrial fibrillation) (HCC)   Diabetes mellitus with complication (HCC)    Acute respiratory failure with hypoxia -Patient with presenting with progressive cough, SOB; cough productive of clear sputum -No GI symptoms -He has an in-home contact (SIL) who is currently quarantined -Pt presented requiring 100% NRB O2 -This patient is considered high risk for COVID at this time -Pertinent labs concerning for COVID include normal WBC count with lymphopenia; mildly increased LFTs -CXR with multifocal opacities which  may be c/w COVID vs. Multifocal PNA; CT chest was not performed  -At this time, will attempt to avoid use of aerosolized medications and use HFAs instead -Cont to treat multifocal PNA as HCAP due to Dec 2019 admission as well as critical nature of illness. Initially started on cefepime with vanc. MRSA swab is neg. Have d/c'd vanc -Continues to require high flow O2  CKD stage 3 -Patient with stage 3 CKD , appears to be stable at this time -Stable. Repeat bmet in AM  HTN -Continue Bisoprolol as tolerated -BP stable and controlled  HLD -Continue Lipitor, Vascepa -Stable at this time  Afib -Rate controlled with bisoprolol -Continue Xarelto -Currently remains rate controlled  DM -Recent A1c 8.3, indicating suboptimal control -Continue TID U500 -Hold Synjardy -Continue SSI coverage as needed  Chronic CHF -9/09 echo with systolic CHF (EF 31-12%) -Diuretics currently on hold -Presently appears euvolemic  COPD with ongoing Tobacco dependence -He resumed smoking. -Encourage cessation.  -Patchdeclined by patient. -Continue Symbicort (formulary substitution to [facility]), Albuterol HFA -Continue bronchodilator per above  DVT prophylaxis: Xarelto Code Status: DNR Family Communication: Pt in room, family not at bedside Disposition Plan: Uncertain at this time  Consultants:     Procedures:     Antimicrobials: Anti-infectives (From admission, onward)   Start     Dose/Rate Route Frequency Ordered Stop   19-Apr-2018 0500  vancomycin (VANCOCIN) 1,750 mg in sodium chloride 0.9 % 500 mL IVPB  Status:  Discontinued     1,750 mg 250 mL/hr over  120 Minutes Intravenous Every 36 hours 04/02/18 1045 04/02/18 1423   04/02/18 1700  vancomycin (VANCOCIN) 1,750 mg in sodium chloride 0.9 % 500 mL IVPB  Status:  Discontinued     1,750 mg 250 mL/hr over 120 Minutes Intravenous Every 24 hours 03/20/2018 1544 04/02/18 1045   04/02/18 0000  ceFEPIme (MAXIPIME) 2 g in sodium chloride 0.9 %  100 mL IVPB     2 g 200 mL/hr over 30 Minutes Intravenous Every 8 hours 03/27/2018 1544     03/17/2018 1545  vancomycin (VANCOCIN) 2,000 mg in sodium chloride 0.9 % 500 mL IVPB     2,000 mg 250 mL/hr over 120 Minutes Intravenous  Once 03/21/2018 1506 03/13/2018 2005   03/18/2018 1515  ceFEPIme (MAXIPIME) 2 g in sodium chloride 0.9 % 100 mL IVPB     2 g 200 mL/hr over 30 Minutes Intravenous  Once 04/05/2018 1506     03/19/2018 1230  cefTRIAXone (ROCEPHIN) 1 g in sodium chloride 0.9 % 100 mL IVPB     1 g 200 mL/hr over 30 Minutes Intravenous  Once 03/31/2018 1217 03/22/2018 1440   03/28/2018 1230  azithromycin (ZITHROMAX) 500 mg in sodium chloride 0.9 % 250 mL IVPB     500 mg 250 mL/hr over 60 Minutes Intravenous  Once 03/07/2018 1217 03/07/2018 1650       Subjective: Still sob, coughing  Objective: Vitals:   04/02/18 1000 04/02/18 1200 04/02/18 1230 04/02/18 1400  BP: (!) 126/53 (!) 122/59  (!) 121/52  Pulse: (!) 102 86 (!) 103 96  Resp: (!) 21 (!) 21 (!) 28 19  Temp:      TempSrc:      SpO2: 94% 96% 94% (!) 89%  Weight:      Height:        Intake/Output Summary (Last 24 hours) at 04/02/2018 1423 Last data filed at 04/02/2018 1244 Gross per 24 hour  Intake 673.56 ml  Output 1375 ml  Net -701.44 ml   Filed Weights   03/13/2018 1153 03/31/2018 2204  Weight: 105.2 kg 106.5 kg    Examination:  General exam: Appears calm and comfortable  Respiratory system: Increased resp effort, no audible wheezing Cardiovascular system: rate controlled, s1, s2 Gastrointestinal system: Abdomen is nondistended, soft and nontender. No organomegaly or masses felt. Normal bowel sounds heard. Central nervous system: Alert and oriented. No focal neurological deficits. Extremities: Symmetric 5 x 5 power. Skin: No rashes, lesions Psychiatry: Judgement and insight appear normal. Mood & affect appropriate.   Data Reviewed: I have personally reviewed following labs and imaging studies  CBC: Recent Labs  Lab 03/07/2018  1209 04/02/18 0338  WBC 7.8 7.9  NEUTROABS 6.5 6.5  HGB 12.1* 12.2*  HCT 40.3 41.8  MCV 88.8 89.1  PLT 204 973   Basic Metabolic Panel: Recent Labs  Lab 03/13/2018 1209 04/02/18 0338  NA 141 142  K 3.5 3.8  CL 101 103  CO2 24 25  GLUCOSE 167* 298*  BUN 33* 41*  CREATININE 1.23 1.38*  CALCIUM 8.6* 8.7*   GFR: Estimated Creatinine Clearance: 54.8 mL/min (A) (by C-G formula based on SCr of 1.38 mg/dL (H)). Liver Function Tests: Recent Labs  Lab 03/17/2018 1209 04/02/18 0338  AST 45* 51*  ALT 32 37  ALKPHOS 75 72  BILITOT 1.0 1.1  PROT 7.2 7.0  ALBUMIN 3.1* 3.1*   No results for input(s): LIPASE, AMYLASE in the last 168 hours. No results for input(s): AMMONIA in the last 168 hours. Coagulation  Profile: No results for input(s): INR, PROTIME in the last 168 hours. Cardiac Enzymes: No results for input(s): CKTOTAL, CKMB, CKMBINDEX, TROPONINI in the last 168 hours. BNP (last 3 results) No results for input(s): PROBNP in the last 8760 hours. HbA1C: No results for input(s): HGBA1C in the last 72 hours. CBG: Recent Labs  Lab 03/27/2018 2138 04/02/18 0736 04/02/18 1220  GLUCAP 326* 291* 375*   Lipid Profile: No results for input(s): CHOL, HDL, LDLCALC, TRIG, CHOLHDL, LDLDIRECT in the last 72 hours. Thyroid Function Tests: No results for input(s): TSH, T4TOTAL, FREET4, T3FREE, THYROIDAB in the last 72 hours. Anemia Panel: Recent Labs    03/21/2018 2127  FERRITIN 424*   Sepsis Labs: Recent Labs  Lab 03/16/2018 1213 04/04/2018 1500 03/14/2018 2127  PROCALCITON  --   --  0.70  LATICACIDVEN 1.2 0.8  --     Recent Results (from the past 240 hour(s))  Blood Culture (routine x 2)     Status: None (Preliminary result)   Collection Time: 03/16/2018 12:13 PM  Result Value Ref Range Status   Specimen Description   Final    BLOOD RIGHT FOREARM Performed at [facility] [address] [place], [place] [address]    Special Requests   Final    BOTTLES  DRAWN AEROBIC AND ANAEROBIC Blood Culture adequate volume Performed at [facility] [address], [place], [place] [address]    Culture   Final    NO GROWTH < 24 HOURS Performed at [facility] [address] [place], [place] [address]    Report Status PENDING  Incomplete  Urine culture     Status: None   Collection Time: 03/18/2018 12:13 PM  Result Value Ref Range Status   Specimen Description   Final    URINE, CLEAN CATCH Performed at [facility], [place] [address], [place], [place] [address]    Special Requests   Final    NONE Performed at [facility], [place] [address] [place], [place] [address]    Culture   Final    Multiple bacterial morphotypes present, none predominant. Suggest appropriate recollection if clinically indicated.   Report Status 04/02/2018 FINAL  Final  Blood Culture (routine x 2)     Status: None (Preliminary result)   Collection Time: 04/04/2018 12:30 PM  Result Value Ref Range Status   Specimen Description   Final    BLOOD LEFT HAND Performed at [facility] [address] [place], [place] [address]    Special Requests   Final    BOTTLES DRAWN AEROBIC ONLY Blood Culture adequate volume Performed at [facility] [address], [place], [place] [address]    Culture   Final    NO GROWTH < 24 HOURS Performed at [facility] [address] [place], [place] [address]    Report Status PENDING  Incomplete  MRSA PCR Screening     Status: None   Collection Time: 03/26/2018 10:13 PM  Result Value Ref Range Status   MRSA by PCR NEGATIVE NEGATIVE Final    Comment:        The GeneXpert MRSA Assay (FDA approved for NASAL specimens only), is one component of a comprehensive MRSA colonization surveillance program. It is not intended to diagnose MRSA infection nor to guide or monitor treatment for MRSA infections. Performed at [facility], [place] [address], [place], [place] [address]  Radiology Studies: Dg Chest Port 1 View  Result Date: 03/17/2018 CLINICAL DATA:  Wheezing for 1 week. EXAM: PORTABLE CHEST 1 VIEW COMPARISON:  PA and lateral chest 01/11/2018, 12/22/2017 and 08/27/2016. CT chest 06/02/2016. FINDINGS: Airspace disease throughout the right chest is most dense in the right upper lobe. There is also airspace disease in the left lung base. Heart size is upper normal. The patient is status post CABG. No pneumothorax or pleural effusion. IMPRESSION: Right much worse than left airspace disease most consistent with multifocal pneumonia. Electronically Signed   By: Inge Rise M.D.   On: 03/09/2018 12:56    Scheduled Meds: . atorvastatin  40 mg Oral q1800  . bisoprolol  5 mg Oral Daily  . docusate sodium  100 mg Oral BID  . gabapentin  300 mg Oral BID  . insulin aspart  0-15 Units Subcutaneous TID WC  . insulin regular human CONCENTRATED  10 Units Subcutaneous TID WC  . Ipratropium-Albuterol  1 puff Inhalation Q6H  . isosorbide mononitrate  30 mg Oral Daily  . mouth rinse  15 mL Mouth Rinse BID  . mirtazapine  45 mg Oral QHS  . mometasone-formoterol  2 puff Inhalation BID  . omega-3 acid ethyl esters  1 g Oral BID  . rivaroxaban  20 mg Oral Q supper  . sertraline  50 mg Oral QHS  . sodium chloride flush  3 mL Intravenous Q12H  . tamsulosin  0.4 mg Oral QPC supper   Continuous Infusions: . sodium chloride Stopped (04/02/18 0830)  . ceFEPime (MAXIPIME) IV    . ceFEPime (MAXIPIME) IV Stopped (04/02/18 0830)     LOS: 1 day   Marylu Lund, MD [facility] Pager On Amion  If 7PM-7AM, please contact night-coverage 04/02/2018, 2:23 PM

## 2018-04-02 NOTE — Progress Notes (Signed)
Pt states that he is unable to attempt voiding into a urinal unless he stands at the side of the bed. Each time pt is able to stand with one assist however pt has DOE and desats to the mid 80s with activity despite being on a NRB. Pt sats improve back to low and mid 90s once returned to bed. Will continue to monitor.

## 2018-04-02 NOTE — Progress Notes (Signed)
Attempted to wean pt from NRB to [place] at [employer] [facility]. Pt unable to maintain stats above 90%. Pt placed back on NRB at 10liters at this time. Will continue to monitor.

## 2018-04-02 NOTE — Progress Notes (Signed)
Pharmacy Antibiotic Note  Joshua [name] is a 78 y.o. male admitted on 03/25/2018 with pneumonia.  Pharmacy has been consulted for vancomycin and cefepime dosing.  Plan: Continue Cefepime 2 gm IV q8 hr Adjust Vancomycin 1750 mg IV Q36 hrs. Goal AUC 400-550. Expected AUC: 504  SCr used: 1.38 F/u renal function,WBC, temp, culture data Vancomycin levels as needed Consider d/c vanc since MRSA PCR is negative  Height: _0  (180.3 cm) Weight: 234 lb 12.6 oz (106.5 kg) IBW/kg (Calculated) : 75.3  Temp (24hrs), Avg:99 F (37.2 C), Min:98 F (36.7 C), Max:100.8 F (38.2 C)  Recent Labs  Lab 03/23/2018 1209 03/13/2018 1213 03/12/2018 1500 04/02/18 0338  WBC 7.8  --   --  7.9  CREATININE 1.23  --   --  1.38*  LATICACIDVEN  --  1.2 0.8  --     Estimated Creatinine Clearance: 54.8 mL/min (A) (by C-G formula based on SCr of 1.38 mg/dL (H)).    Allergies  Allergen Reactions  . Morphine And Related Other (See Comments)    Drenched with perspiration  . Entresto [Sacubitril-Valsartan]     hypotension  . Demerol [Meperidine] Nausea Only  . Starlix [Nateglinide] Other (See Comments)    gassy    Antimicrobials this admission: 3/27 Vanc>> 3/27 Cefepime>> 3/27 CTX x 1 3/27 azith x 1   Dose adjustments this admission:   Microbiology results: 3/27 BCx: sent 3/27 UCx: sent 3/28 Influenza A/B: neg/neg 3/28 MRSA PCR: neg  Thank you for allowing pharmacy to be a part of this patient's care.  Peggyann Juba, PharmD, BCPS Pager: [phone] 04/02/2018 10:46 AM

## 2018-04-03 DIAGNOSIS — E118 Type 2 diabetes mellitus with unspecified complications: Secondary | ICD-10-CM

## 2018-04-03 LAB — CBC WITH DIFFERENTIAL/PLATELET
Abs Immature Granulocytes: 0.06 10*3/uL (ref 0.00–0.07)
Basophils Absolute: 0 10*3/uL (ref 0.0–0.1)
Basophils Relative: 0 %
EOS ABS: 0 10*3/uL (ref 0.0–0.5)
Eosinophils Relative: 0 %
HCT: 39.8 % (ref 39.0–52.0)
Hemoglobin: 11.6 g/dL — ABNORMAL LOW (ref 13.0–17.0)
IMMATURE GRANULOCYTES: 1 %
Lymphocytes Relative: 3 %
Lymphs Abs: 0.3 10*3/uL — ABNORMAL LOW (ref 0.7–4.0)
MCH: 26.3 pg (ref 26.0–34.0)
MCHC: 29.1 g/dL — ABNORMAL LOW (ref 30.0–36.0)
MCV: 90.2 fL (ref 80.0–100.0)
Monocytes Absolute: 0.6 10*3/uL (ref 0.1–1.0)
Monocytes Relative: 7 %
Neutro Abs: 7 10*3/uL (ref 1.7–7.7)
Neutrophils Relative %: 89 %
Platelets: 200 10*3/uL (ref 150–400)
RBC: 4.41 MIL/uL (ref 4.22–5.81)
RDW: 16.9 % — AB (ref 11.5–15.5)
WBC: 7.9 10*3/uL (ref 4.0–10.5)
nRBC: 0 % (ref 0.0–0.2)

## 2018-04-03 LAB — INTERLEUKIN-6, PLASMA: Interleukin-6, Plasma: 162.6 pg/mL — ABNORMAL HIGH (ref 0.0–12.2)

## 2018-04-03 LAB — GLUCOSE, CAPILLARY: Glucose-Capillary: 275 mg/dL — ABNORMAL HIGH (ref 70–99)

## 2018-04-03 MED ORDER — CHLORHEXIDINE GLUCONATE CLOTH 2 % EX PADS
6.0000 | MEDICATED_PAD | Freq: Every day | CUTANEOUS | Status: DC
  Administered 2018-04-03: 6 via TOPICAL

## 2018-04-05 LAB — NOVEL CORONAVIRUS, NAA (HOSP ORDER, SEND-OUT TO REF LAB; TAT 18-24 HRS): SARS-CoV-2, NAA: DETECTED — AB

## 2018-04-06 LAB — CULTURE, BLOOD (ROUTINE X 2)
Culture: NO GROWTH
Culture: NO GROWTH
SPECIAL REQUESTS: ADEQUATE
Special Requests: ADEQUATE

## 2018-04-06 NOTE — Telephone Encounter (Signed)
Noted. Pt was admitted 03/14/2018 with acute-on-chronic hypoxic resp failure.

## 2018-04-06 NOTE — Discharge Summary (Addendum)
Death Summary  ESAU FRIDMAN ENI:[id number] DOB: May 13, 1940 DOA: April 24, 2018  PCP: Tammi Sou, MD  Admit date: Apr 24, 2018 Date of Death: 04-26-18 Time of Death: 03-09-53 Notification: Tammi Sou, MD notified of death of 2018/04/26  UPDATE: Labs pending at time of death were reviewed and patient noted to have [place] test as of 3/31  History of present illness:  78 y.o.malewith medical history significant ofafib on Xarelto; PAD; pericardial effusion with tamponade in 2013/03/09; HLD; dementia; IBS; HTN; CAD s/p CABG; DM; COPD; stage 3 CKD; and chronic combined CHF presenting with SOB.He reported increased WOB and cough starting about 10 days and progressing. He denied fevers. At time of presentation, pt did not wish to be intubated or have CPR should the situation arise.  ED Course:COPD on home O2, presenting with SOB. Has a sick contact (sister-in-law) who is "quarantined" in his home. 78% on RA. Given Albuterol. On NRB, mid-90s. Increased WOB, wheezes, rhonchi. Multifocal PNA - give Rocephin, Azithromycin. Normal CBC. No CPR/intubation. Had one dose of oral prednisone this AM after tele-visit with pulm day prior to admit  Final Diagnoses:  Acute respiratory failure with hypoxia secondary to COVID-19 -Patient presented withprogressive cough,SOB; cough productive of clear sputum -He has an in-home contact (SIL) who is currently quarantined -Pt presented requiring 100% NRBO2, later weaned to high flow O2 -CXR with multifocal opacities which may be c/w COVID vs. Multifocal PNA; CT chest was not performed  -Continued on HFAs bronchodilator -Initially started on cefepime with vanc. MRSA swab is neg. Have d/c'd vanc -Continued to require increased O2 requirements despite addition of lasix and broad spectrum abx -Unfortunately, despite aggressive ICU care, broad spectrum antibiotics, and supportive care, patient expired on 04-26-22 at 1255. No spontaneous breaths or  heart sounds were noted -COVID test result remains pending at time of death (obtained 04/25/22)  CKD stage 3 -Patient with stage 3 CKD, appears to be stable at time of presentation  HTN -ContinueBisoprolol as tolerated -BP noted to have remained stable and controlled  HLD -Continue Lipitor, Vascepa -Remained stable this visit  Afib -Rate controlled withbisoprolol with continued xarelto  DM -Recent A1c 8.3, indicating suboptimal control -Continued TID U500 -Held [place] while in hospital with SSI coverage as needed  Acute on Chronic Combined Systolic/Diastolic CHF -6/14 echo with systolic CHF (EF 43-15%) -Diuretics initially on hold at time of presentation -Tolerated dose of lasix -Presenting BNP of 1300 with lasix given as a result -Despite IV lasix, no significant improvement was noted  COPD with ongoingTobacco dependence -He resumed smoking. -Encouraged cessation.  -Patchdeclined by patient at time of admission -Continued Symbicort (formulary substitution to [facility]), Continue with albuterol HFA -Continue bronchodilator per above    The results of significant diagnostics from this hospitalization (including imaging, microbiology, ancillary and laboratory) are listed below for reference.    Significant Diagnostic Studies: Dg Chest Port 1 View  Result Date: 04-24-18 CLINICAL DATA:  Wheezing for 1 week. EXAM: PORTABLE CHEST 1 VIEW COMPARISON:  PA and lateral chest 01/11/2018, 12/22/2017 and 08/27/2016. CT chest 06/02/2016. FINDINGS: Airspace disease throughout the right chest is most dense in the right upper lobe. There is also airspace disease in the left lung base. Heart size is upper normal. The patient is status post CABG. No pneumothorax or pleural effusion. IMPRESSION: Right much worse than left airspace disease most consistent with multifocal pneumonia. Electronically Signed   By: Inge Rise M.D.   On: 04-24-2018 12:56    Microbiology: Recent  Results (from  the past 240 hour(s))  Blood Culture (routine x 2)     Status: None (Preliminary result)   Collection Time: 03/12/2018 12:13 PM  Result Value Ref Range Status   Specimen Description   Final    BLOOD RIGHT FOREARM Performed at [facility] [address] [place], [place] [address]    Special Requests   Final    BOTTLES DRAWN AEROBIC AND ANAEROBIC Blood Culture adequate volume Performed at [facility] [address], [place], [place] [address]    Culture   Final    NO GROWTH 2 DAYS Performed at [facility] [address], [place], [place] [address]    Report Status PENDING  Incomplete  Urine culture     Status: None   Collection Time: 03/14/2018 12:13 PM  Result Value Ref Range Status   Specimen Description   Final    URINE, CLEAN CATCH Performed at [facility], [place] [address], [place], [place] [address]    Special Requests   Final    NONE Performed at [facility], [place] [address], [place], [place] [address]    Culture   Final    Multiple bacterial morphotypes present, none predominant. Suggest appropriate recollection if clinically indicated.   Report Status 04/02/2018 FINAL  Final  Blood Culture (routine x 2)     Status: None (Preliminary result)   Collection Time: 04/02/2018 12:30 PM  Result Value Ref Range Status   Specimen Description   Final    BLOOD LEFT HAND Performed at [facility] [address] [place], [place] [address]    Special Requests   Final    BOTTLES DRAWN AEROBIC ONLY Blood Culture adequate volume Performed at [facility] [address], [place], [place] [address]    Culture   Final    NO GROWTH 2 DAYS Performed at [facility] [address] [place], [place] [address]    Report Status PENDING  Incomplete  MRSA PCR Screening     Status: None   Collection Time: 03/22/2018 10:13 PM  Result Value Ref Range  Status   MRSA by PCR NEGATIVE NEGATIVE Final    Comment:        The GeneXpert MRSA Assay (FDA approved for NASAL specimens only), is one component of a comprehensive MRSA colonization surveillance program. It is not intended to diagnose MRSA infection nor to guide or monitor treatment for MRSA infections. Performed at [facility], [place] [address], [place], [place] [address]      Labs: Basic Metabolic Panel: Recent Labs  Lab 03/13/2018 1209 04/02/18 0338  NA 141 142  K 3.5 3.8  CL 101 103  CO2 24 25  GLUCOSE 167* 298*  BUN 33* 41*  CREATININE 1.23 1.38*  CALCIUM 8.6* 8.7*   Liver Function Tests: Recent Labs  Lab 03/19/2018 1209 04/02/18 0338  AST 45* 51*  ALT 32 37  ALKPHOS 75 72  BILITOT 1.0 1.1  PROT 7.2 7.0  ALBUMIN 3.1* 3.1*   No results for input(s): LIPASE, AMYLASE in the last 168 hours. No results for input(s): AMMONIA in the last 168 hours. CBC: Recent Labs  Lab 03/09/2018 1209 04/02/18 0338 Apr 13, 2018 0551  WBC 7.8 7.9 7.9  NEUTROABS 6.5 6.5 7.0  HGB 12.1* 12.2* 11.6*  HCT 40.3 41.8 39.8  MCV 88.8 89.1 90.2  PLT 204 210 200   Cardiac Enzymes: No results for input(s): CKTOTAL,  CKMB, CKMBINDEX, TROPONINI in the last 168 hours. D-Dimer Recent Labs    03/24/2018 2127  DDIMER 1.92*   BNP: Invalid input(s): POCBNP CBG: Recent Labs  Lab 04/02/18 0736 04/02/18 1220 04/02/18 1658 04/02/18 2107 05/01/2018 0817  GLUCAP 291* 375* 383* 256* 275*   Anemia work up Recent Labs    03/10/2018 2127  FERRITIN 424*   Urinalysis    Component Value Date/Time   COLORURINE YELLOW 03/25/2018 1213   APPEARANCEUR HAZY (A) 03/14/2018 1213   LABSPEC 1.014 03/18/2018 1213   PHURINE 5.0 03/09/2018 1213   GLUCOSEU >=500 (A) 03/11/2018 1213   HGBUR SMALL (A) 03/25/2018 1213   BILIRUBINUR NEGATIVE 03/13/2018 1213   KETONESUR 5 (A) 03/06/2018 1213   PROTEINUR 30 (A) 04/04/2018 1213   UROBILINOGEN 0.2 08/11/2013 1624   NITRITE NEGATIVE  03/08/2018 1213   LEUKOCYTESUR LARGE (A) 03/29/2018 1213   Sepsis Labs Invalid input(s): PROCALCITONIN,  WBC,  LACTICIDVEN    SIGNED:  Marylu Lund, MD  [facility] 2018/05/01, 1:27 PM  If 7PM-7AM, please contact night-coverage [email or web address] Password [email or web address]

## 2018-04-06 NOTE — Progress Notes (Signed)
PROGRESS NOTE    Joshua [name]  NOB:[id number] DOB: 12-30-1940 DOA: 03/15/2018 PCP: Tammi Sou, MD    Brief Narrative:  78 y.o. male with medical history significant of afib on Xarelto; PAD; pericardial effusion with tamponade in 2015; HLD; dementia; IBS; HTN; CAD s/p CABG; DM; COPD; stage 3 CKD; and chronic combined CHF presenting with SOB.   He reports increased WOB and cough starting about 10 days and progressing.  He denies fever.   His sister-in-law lives with him and his wife and has also had flu-like illness, for which she is quarantined.  He reports that he is feeling somewhat better now.   At time of admission, pt was clear that he does not wish to be intubated or have CPR should the situation arise.   ED Course:   COPD on home O2, presenting with SOB.  Has a sick contact (sister-in-law) who is "quarantined" in his home.  78% on RA.  Given Albuterol.  On NRB, mid-90s.  Increased WOB, wheezes, rhonchi.  Multifocal PNA - give Rocephin, Azithromycin.  Normal CBC.  No CPR/intubation.  Had one dose of oral prednisone this AM after tele-visit with pulm day prior to admit  Assessment & Plan:   Principal Problem:   Acute on chronic respiratory failure with hypoxia (HCC) Active Problems:   HTN (hypertension), benign   Tobacco dependence   COPD (chronic obstructive pulmonary disease) (HCC)   CKD (chronic kidney disease) stage 3, GFR 30-59 ml/min (HCC)   PAF (paroxysmal atrial fibrillation) (HCC)   Diabetes mellitus with complication (HCC)    Acute respiratory failure with hypoxia -Patient with presenting with progressive cough, SOB; cough productive of clear sputum -No GI symptoms -He has an in-home contact (SIL) who is currently quarantined -Pt presented requiring 100% NRB O2 -This patient is considered high risk for COVID at this time -Pertinent labs concerning for COVID include normal WBC count with lymphopenia; mildly increased LFTs  -CXR with multifocal opacities which  may be c/w COVID vs. Multifocal PNA; CT chest was not performed  -Continue to avoid use of aerosolized medications and use HFAs instead -Cont to treat multifocal PNA as HCAP due to Dec 2019 admission as well as critical nature of illness. Initially started on cefepime with vanc. MRSA swab is neg. Have d/c'd vanc -Continues to require increased O2 requirements  CKD stage 3 -Patient with stage 3 CKD , appears to be stable at time of presentation -Stable. Repeat bmet in AM  HTN -Continue Bisoprolol as tolerated -BP presently stable and controlled  HLD -Continue Lipitor, Vascepa -Stable at this time  Afib -Rate controlled with bisoprolol -Continue Xarelto -Remains rate controlled  DM -Recent A1c 8.3, indicating suboptimal control -Continue TID U500 -Holding Synjardy -Cont SSI coverage  Chronic CHF -9/47 echo with systolic CHF (EF 65-46%) -Diuretics initially on hold at time of presentation -Tolerated dose of lasix -Presenting BNP of 1300. Will continue lasix as tolerated  COPD with ongoing Tobacco dependence -He resumed smoking. -Encouraged cessation.  -Patchdeclined by patient. -Continue Symbicort (formulary substitution to [facility]), Continue with albuterol HFA -Continue bronchodilator per above  DVT prophylaxis: Xarelto Code Status: DNR Family Communication: Pt in room, family not at bedside Disposition Plan: Uncertain at this time  Consultants:     Procedures:     Antimicrobials: Anti-infectives (From admission, onward)   Start     Dose/Rate Route Frequency Ordered Stop   Apr 04, 2018 0500  vancomycin (VANCOCIN) 1,750 mg in sodium chloride 0.9 % 500 mL IVPB  Status:  Discontinued     1,750 mg 250 mL/hr over 120 Minutes Intravenous Every 36 hours 04/02/18 1045 04/02/18 1423   04/02/18 1700  vancomycin (VANCOCIN) 1,750 mg in sodium chloride 0.9 % 500 mL IVPB  Status:  Discontinued     1,750 mg 250 mL/hr over 120 Minutes Intravenous Every 24 hours  03/16/2018 1544 04/02/18 1045   04/02/18 0000  ceFEPIme (MAXIPIME) 2 g in sodium chloride 0.9 % 100 mL IVPB     2 g 200 mL/hr over 30 Minutes Intravenous Every 8 hours 03/12/2018 1544     03/31/2018 1545  vancomycin (VANCOCIN) 2,000 mg in sodium chloride 0.9 % 500 mL IVPB     2,000 mg 250 mL/hr over 120 Minutes Intravenous  Once 03/31/2018 1506 03/16/2018 2005   03/06/2018 1515  ceFEPIme (MAXIPIME) 2 g in sodium chloride 0.9 % 100 mL IVPB     2 g 200 mL/hr over 30 Minutes Intravenous  Once 03/10/2018 1506     03/18/2018 1230  cefTRIAXone (ROCEPHIN) 1 g in sodium chloride 0.9 % 100 mL IVPB     1 g 200 mL/hr over 30 Minutes Intravenous  Once 03/18/2018 1217 03/24/2018 1440   03/26/2018 1230  azithromycin (ZITHROMAX) 500 mg in sodium chloride 0.9 % 250 mL IVPB     500 mg 250 mL/hr over 60 Minutes Intravenous  Once 03/31/2018 1217 03/24/2018 1650      Subjective: Complaining of sob  Objective: Vitals:   April 17, 2018 0535 April 17, 2018 0556 04-17-2018 0800 04/17/18 0900  BP:   (!) 143/63 136/72  Pulse: 97  99 95  Resp: (!) 22  (!) 23 (!) 23  Temp:   97.9 F (36.6 C)   TempSrc:   Oral   SpO2: 90%  92% 93%  Weight:  106.1 kg    Height:        Intake/Output Summary (Last 24 hours) at April 17, 2018 1153 Last data filed at 17-Apr-2018 0600 Gross per 24 hour  Intake 678.42 ml  Output 225 ml  Net 453.42 ml   Filed Weights   04/05/2018 1153 03/27/2018 2204 April 17, 2018 0556  Weight: 105.2 kg 106.5 kg 106.1 kg    Examination: General exam: Awake, laying in bed, in nad Respiratory system: Increased resp effort, no audible wheezing Cardiovascular system: regular rate, s1, s2 Gastrointestinal system: Soft, nondistended, positive BS Central nervous system: CN2-12 grossly intact, strength intact Extremities: Perfused, no clubbing Skin: Normal skin turgor, no notable skin lesions seen Psychiatry: Mood normal // no visual hallucinations   Data Reviewed: I have personally reviewed following labs and imaging studies  CBC: Recent  Labs  Lab 03/12/2018 1209 04/02/18 0338 2018-04-17 0551  WBC 7.8 7.9 7.9  NEUTROABS 6.5 6.5 7.0  HGB 12.1* 12.2* 11.6*  HCT 40.3 41.8 39.8  MCV 88.8 89.1 90.2  PLT 204 210 203   Basic Metabolic Panel: Recent Labs  Lab 03/12/2018 1209 04/02/18 0338  NA 141 142  K 3.5 3.8  CL 101 103  CO2 24 25  GLUCOSE 167* 298*  BUN 33* 41*  CREATININE 1.23 1.38*  CALCIUM 8.6* 8.7*   GFR: Estimated Creatinine Clearance: 54.7 mL/min (A) (by C-G formula based on SCr of 1.38 mg/dL (H)). Liver Function Tests: Recent Labs  Lab 03/08/2018 1209 04/02/18 0338  AST 45* 51*  ALT 32 37  ALKPHOS 75 72  BILITOT 1.0 1.1  PROT 7.2 7.0  ALBUMIN 3.1* 3.1*   No results for input(s): LIPASE, AMYLASE in the last 168 hours. No results  for input(s): AMMONIA in the last 168 hours. Coagulation Profile: No results for input(s): INR, PROTIME in the last 168 hours. Cardiac Enzymes: No results for input(s): CKTOTAL, CKMB, CKMBINDEX, TROPONINI in the last 168 hours. BNP (last 3 results) No results for input(s): PROBNP in the last 8760 hours. HbA1C: No results for input(s): HGBA1C in the last 72 hours. CBG: Recent Labs  Lab 04/02/18 0736 04/02/18 1220 04/02/18 1658 04/02/18 2107 04-12-18 0817  GLUCAP 291* 375* 383* 256* 275*   Lipid Profile: No results for input(s): CHOL, HDL, LDLCALC, TRIG, CHOLHDL, LDLDIRECT in the last 72 hours. Thyroid Function Tests: No results for input(s): TSH, T4TOTAL, FREET4, T3FREE, THYROIDAB in the last 72 hours. Anemia Panel: Recent Labs    03/09/2018 2127  FERRITIN 424*   Sepsis Labs: Recent Labs  Lab 03/26/2018 1213 03/16/2018 1500 04/05/2018 2127  PROCALCITON  --   --  0.70  LATICACIDVEN 1.2 0.8  --     Recent Results (from the past 240 hour(s))  Blood Culture (routine x 2)     Status: None (Preliminary result)   Collection Time: 03/17/2018 12:13 PM  Result Value Ref Range Status   Specimen Description   Final    BLOOD RIGHT FOREARM Performed at [facility] [address] [place], [place] [address]    Special Requests   Final    BOTTLES DRAWN AEROBIC AND ANAEROBIC Blood Culture adequate volume Performed at [facility] [address], [place], [place] [address]    Culture   Final    NO GROWTH < 24 HOURS Performed at [facility] [address], [place], [place] [address]    Report Status PENDING  Incomplete  Urine culture     Status: None   Collection Time: 03/27/2018 12:13 PM  Result Value Ref Range Status   Specimen Description   Final    URINE, CLEAN CATCH Performed at [facility], [place] [address] [place], [place] [address]    Special Requests   Final    NONE Performed at [facility], [place] [address] [place], [place] [address]    Culture   Final    Multiple bacterial morphotypes present, none predominant. Suggest appropriate recollection if clinically indicated.   Report Status 04/02/2018 FINAL  Final  Blood Culture (routine x 2)     Status: None (Preliminary result)   Collection Time: 03/25/2018 12:30 PM  Result Value Ref Range Status   Specimen Description   Final    BLOOD LEFT HAND Performed at [facility] [address] [place], [place] [address]    Special Requests   Final    BOTTLES DRAWN AEROBIC ONLY Blood Culture adequate volume Performed at [facility] [address], [place], [place] [address]    Culture   Final    NO GROWTH < 24 HOURS Performed at [facility] [address] [place], [place] [address]    Report Status PENDING  Incomplete  MRSA PCR Screening     Status: None   Collection Time: 03/26/2018 10:13 PM  Result Value Ref Range Status   MRSA by PCR NEGATIVE NEGATIVE Final    Comment:        The GeneXpert MRSA Assay (FDA approved for NASAL specimens only), is one component of a comprehensive MRSA colonization surveillance program. It is not intended to diagnose  MRSA infection nor to guide or monitor treatment for MRSA infections. Performed at  [facility], [place] [address], [place], [place] [address]      Radiology Studies: Dg Chest Port 1 View  Result Date: 04/05/2018 CLINICAL DATA:  Wheezing for 1 week. EXAM: PORTABLE CHEST 1 VIEW COMPARISON:  PA and lateral chest 01/11/2018, 12/22/2017 and 08/27/2016. CT chest 06/02/2016. FINDINGS: Airspace disease throughout the right chest is most dense in the right upper lobe. There is also airspace disease in the left lung base. Heart size is upper normal. The patient is status post CABG. No pneumothorax or pleural effusion. IMPRESSION: Right much worse than left airspace disease most consistent with multifocal pneumonia. Electronically Signed   By: Inge Rise M.D.   On: 03/13/2018 12:56    Scheduled Meds:  atorvastatin  40 mg Oral q1800   bisoprolol  5 mg Oral Daily   Chlorhexidine Gluconate Cloth  6 each Topical Daily   docusate sodium  100 mg Oral BID   gabapentin  300 mg Oral BID   insulin aspart  0-15 Units Subcutaneous TID WC   insulin regular human CONCENTRATED  10 Units Subcutaneous TID WC   Ipratropium-Albuterol  1 puff Inhalation Q6H   isosorbide mononitrate  30 mg Oral Daily   mouth rinse  15 mL Mouth Rinse BID   mirtazapine  45 mg Oral QHS   mometasone-formoterol  2 puff Inhalation BID   omega-3 acid ethyl esters  1 g Oral BID   rivaroxaban  20 mg Oral Q supper   sertraline  50 mg Oral QHS   sodium chloride flush  3 mL Intravenous Q12H   tamsulosin  0.4 mg Oral QPC supper   Continuous Infusions:  sodium chloride Stopped (04/02/18 1735)   ceFEPime (MAXIPIME) IV     ceFEPime (MAXIPIME) IV Stopped (04/08/2018 1021)     LOS: 2 days   Marylu Lund, MD [facility] Pager On Amion  If 7PM-7AM, please contact night-coverage 04/08/2018, 11:53 AM

## 2018-04-06 DEATH — deceased

## 2018-04-19 ENCOUNTER — Ambulatory Visit: Payer: Self-pay | Admitting: Family Medicine

## 2018-05-05 ENCOUNTER — Encounter (HOSPITAL_COMMUNITY): Payer: Self-pay | Admitting: Cardiology

## 2018-05-10 ENCOUNTER — Other Ambulatory Visit: Payer: Self-pay | Admitting: *Deleted

## 2018-05-16 ENCOUNTER — Encounter: Payer: Self-pay | Admitting: Psychology

## 2018-05-31 ENCOUNTER — Ambulatory Visit: Payer: Self-pay | Admitting: Endocrinology

## 2018-05-31 ENCOUNTER — Encounter: Payer: Self-pay | Admitting: Psychology

## 2018-06-06 ENCOUNTER — Ambulatory Visit: Payer: Self-pay | Admitting: Neurology

## 2018-06-29 ENCOUNTER — Ambulatory Visit: Payer: Self-pay | Admitting: Family Medicine

## 2021-12-24 NOTE — Progress Notes (Signed)
This encounter was created in error - please disregard.
